# Patient Record
Sex: Female | Born: 1945
Health system: Southern US, Community
[De-identification: ages and names within clinical notes are randomized; demographics above are authoritative.]

## PROBLEM LIST (undated history)

## (undated) DIAGNOSIS — H547 Unspecified visual loss: Secondary | ICD-10-CM

## (undated) DIAGNOSIS — R42 Dizziness and giddiness: Secondary | ICD-10-CM

## (undated) DIAGNOSIS — I10 Essential (primary) hypertension: Secondary | ICD-10-CM

## (undated) DIAGNOSIS — H6690 Otitis media, unspecified, unspecified ear: Secondary | ICD-10-CM

## (undated) DIAGNOSIS — Z9289 Personal history of other medical treatment: Secondary | ICD-10-CM

## (undated) DIAGNOSIS — M2550 Pain in unspecified joint: Secondary | ICD-10-CM

## (undated) DIAGNOSIS — Z86718 Personal history of other venous thrombosis and embolism: Secondary | ICD-10-CM

## (undated) DIAGNOSIS — G4733 Obstructive sleep apnea (adult) (pediatric): Secondary | ICD-10-CM

## (undated) DIAGNOSIS — I739 Peripheral vascular disease, unspecified: Secondary | ICD-10-CM

## (undated) DIAGNOSIS — R112 Nausea with vomiting, unspecified: Secondary | ICD-10-CM

## (undated) DIAGNOSIS — C50919 Malignant neoplasm of unspecified site of unspecified female breast: Secondary | ICD-10-CM

## (undated) DIAGNOSIS — R0600 Dyspnea, unspecified: Secondary | ICD-10-CM

## (undated) DIAGNOSIS — E785 Hyperlipidemia, unspecified: Secondary | ICD-10-CM

## (undated) DIAGNOSIS — J189 Pneumonia, unspecified organism: Secondary | ICD-10-CM

## (undated) DIAGNOSIS — R252 Cramp and spasm: Secondary | ICD-10-CM

## (undated) DIAGNOSIS — Z803 Family history of malignant neoplasm of breast: Secondary | ICD-10-CM

## (undated) DIAGNOSIS — G459 Transient cerebral ischemic attack, unspecified: Secondary | ICD-10-CM

## (undated) DIAGNOSIS — C801 Malignant (primary) neoplasm, unspecified: Secondary | ICD-10-CM

## (undated) DIAGNOSIS — K219 Gastro-esophageal reflux disease without esophagitis: Secondary | ICD-10-CM

## (undated) DIAGNOSIS — M2540 Effusion, unspecified joint: Secondary | ICD-10-CM

## (undated) DIAGNOSIS — M169 Osteoarthritis of hip, unspecified: Secondary | ICD-10-CM

## (undated) DIAGNOSIS — R209 Unspecified disturbances of skin sensation: Secondary | ICD-10-CM

## (undated) DIAGNOSIS — Z8679 Personal history of other diseases of the circulatory system: Secondary | ICD-10-CM

## (undated) DIAGNOSIS — Z9889 Other specified postprocedural states: Secondary | ICD-10-CM

## (undated) DIAGNOSIS — M79609 Pain in unspecified limb: Secondary | ICD-10-CM

## (undated) DIAGNOSIS — E663 Overweight: Secondary | ICD-10-CM

## (undated) DIAGNOSIS — H409 Unspecified glaucoma: Secondary | ICD-10-CM

## (undated) DIAGNOSIS — Z8542 Personal history of malignant neoplasm of other parts of uterus: Secondary | ICD-10-CM

## (undated) DIAGNOSIS — Z923 Personal history of irradiation: Secondary | ICD-10-CM

## (undated) DIAGNOSIS — E119 Type 2 diabetes mellitus without complications: Secondary | ICD-10-CM

## (undated) HISTORY — DX: Family history of malignant neoplasm of breast: Z80.3

## (undated) HISTORY — DX: Pain in unspecified limb: M79.609

## (undated) HISTORY — DX: Personal history of other venous thrombosis and embolism: Z86.718

## (undated) HISTORY — DX: Unspecified glaucoma: H40.9

## (undated) HISTORY — PX: CHOLECYSTECTOMY: SHX55

## (undated) HISTORY — DX: Unspecified disturbances of skin sensation: R20.9

## (undated) HISTORY — DX: Type 2 diabetes mellitus without complications: E11.9

## (undated) HISTORY — DX: Osteoarthritis of hip, unspecified: M16.9

## (undated) HISTORY — PX: OTHER SURGICAL HISTORY: SHX169

## (undated) HISTORY — PX: KNEE ARTHROSCOPY: SHX127

## (undated) HISTORY — DX: Otitis media, unspecified, unspecified ear: H66.90

## (undated) HISTORY — DX: Obstructive sleep apnea (adult) (pediatric): G47.33

## (undated) HISTORY — DX: Gastro-esophageal reflux disease without esophagitis: K21.9

## (undated) HISTORY — PX: OOPHORECTOMY: SHX86

## (undated) HISTORY — DX: Overweight: E66.3

## (undated) HISTORY — DX: Dizziness and giddiness: R42

## (undated) HISTORY — DX: Personal history of other diseases of the circulatory system: Z86.79

## (undated) HISTORY — DX: Personal history of malignant neoplasm of other parts of uterus: Z85.42

## (undated) HISTORY — DX: Hyperlipidemia, unspecified: E78.5

## (undated) HISTORY — DX: Essential (primary) hypertension: I10

---

## 1998-02-11 ENCOUNTER — Other Ambulatory Visit: Admission: RE | Admit: 1998-02-11 | Discharge: 1998-02-11 | Payer: Self-pay | Admitting: Obstetrics & Gynecology

## 1998-06-13 DIAGNOSIS — Z8542 Personal history of malignant neoplasm of other parts of uterus: Secondary | ICD-10-CM

## 1998-06-13 HISTORY — DX: Personal history of malignant neoplasm of other parts of uterus: Z85.42

## 1998-06-13 HISTORY — PX: ABDOMINAL HYSTERECTOMY: SHX81

## 1999-04-06 ENCOUNTER — Other Ambulatory Visit: Admission: RE | Admit: 1999-04-06 | Discharge: 1999-04-06 | Payer: Self-pay | Admitting: Obstetrics & Gynecology

## 1999-08-03 ENCOUNTER — Ambulatory Visit: Admission: RE | Admit: 1999-08-03 | Discharge: 1999-08-03 | Payer: Self-pay | Admitting: Obstetrics & Gynecology

## 1999-08-05 ENCOUNTER — Other Ambulatory Visit: Admission: RE | Admit: 1999-08-05 | Discharge: 1999-08-05 | Payer: Self-pay | Admitting: Obstetrics & Gynecology

## 1999-08-05 ENCOUNTER — Encounter (INDEPENDENT_AMBULATORY_CARE_PROVIDER_SITE_OTHER): Payer: Self-pay

## 1999-08-11 ENCOUNTER — Ambulatory Visit: Admission: RE | Admit: 1999-08-11 | Discharge: 1999-08-11 | Payer: Self-pay | Admitting: Gynecology

## 1999-08-25 ENCOUNTER — Inpatient Hospital Stay (HOSPITAL_COMMUNITY): Admission: RE | Admit: 1999-08-25 | Discharge: 1999-08-28 | Payer: Self-pay | Admitting: Obstetrics & Gynecology

## 1999-09-02 ENCOUNTER — Emergency Department (HOSPITAL_COMMUNITY): Admission: EM | Admit: 1999-09-02 | Discharge: 1999-09-03 | Payer: Self-pay | Admitting: Emergency Medicine

## 2000-08-03 ENCOUNTER — Ambulatory Visit (HOSPITAL_BASED_OUTPATIENT_CLINIC_OR_DEPARTMENT_OTHER): Admission: RE | Admit: 2000-08-03 | Discharge: 2000-08-03 | Payer: Self-pay | Admitting: Internal Medicine

## 2001-02-11 LAB — HM MAMMOGRAPHY

## 2001-09-25 ENCOUNTER — Encounter: Admission: RE | Admit: 2001-09-25 | Discharge: 2001-12-24 | Payer: Self-pay | Admitting: Internal Medicine

## 2002-06-13 HISTORY — PX: OTHER SURGICAL HISTORY: SHX169

## 2002-08-19 ENCOUNTER — Ambulatory Visit (HOSPITAL_BASED_OUTPATIENT_CLINIC_OR_DEPARTMENT_OTHER): Admission: RE | Admit: 2002-08-19 | Discharge: 2002-08-19 | Payer: Self-pay | Admitting: Critical Care Medicine

## 2002-11-21 ENCOUNTER — Ambulatory Visit (HOSPITAL_BASED_OUTPATIENT_CLINIC_OR_DEPARTMENT_OTHER): Admission: RE | Admit: 2002-11-21 | Discharge: 2002-11-21 | Payer: Self-pay | Admitting: Critical Care Medicine

## 2003-12-01 ENCOUNTER — Encounter (HOSPITAL_BASED_OUTPATIENT_CLINIC_OR_DEPARTMENT_OTHER): Admission: RE | Admit: 2003-12-01 | Discharge: 2003-12-12 | Payer: Self-pay | Admitting: Internal Medicine

## 2004-03-09 ENCOUNTER — Other Ambulatory Visit: Admission: RE | Admit: 2004-03-09 | Discharge: 2004-03-09 | Payer: Self-pay | Admitting: Obstetrics & Gynecology

## 2005-06-18 ENCOUNTER — Emergency Department (HOSPITAL_COMMUNITY): Admission: EM | Admit: 2005-06-18 | Discharge: 2005-06-18 | Payer: Self-pay | Admitting: Emergency Medicine

## 2005-06-18 ENCOUNTER — Ambulatory Visit: Payer: Self-pay | Admitting: Internal Medicine

## 2005-06-20 ENCOUNTER — Encounter: Payer: Self-pay | Admitting: Cardiology

## 2005-06-20 ENCOUNTER — Ambulatory Visit: Payer: Self-pay

## 2005-06-21 ENCOUNTER — Ambulatory Visit: Payer: Self-pay | Admitting: Internal Medicine

## 2005-06-22 ENCOUNTER — Ambulatory Visit: Payer: Self-pay | Admitting: Internal Medicine

## 2005-06-27 ENCOUNTER — Ambulatory Visit (HOSPITAL_COMMUNITY): Admission: RE | Admit: 2005-06-27 | Discharge: 2005-06-27 | Payer: Self-pay | Admitting: Internal Medicine

## 2005-06-28 ENCOUNTER — Ambulatory Visit: Payer: Self-pay | Admitting: Internal Medicine

## 2006-03-20 ENCOUNTER — Ambulatory Visit: Payer: Self-pay | Admitting: Internal Medicine

## 2006-03-27 ENCOUNTER — Ambulatory Visit: Payer: Self-pay | Admitting: Internal Medicine

## 2006-10-05 ENCOUNTER — Ambulatory Visit: Payer: Self-pay | Admitting: Internal Medicine

## 2006-10-06 ENCOUNTER — Ambulatory Visit: Payer: Self-pay | Admitting: Internal Medicine

## 2006-10-06 LAB — CONVERTED CEMR LAB
BUN: 11 mg/dL (ref 6–23)
CO2: 29 meq/L (ref 19–32)
Calcium: 9 mg/dL (ref 8.4–10.5)
Chloride: 106 meq/L (ref 96–112)
Cholesterol: 163 mg/dL (ref 0–200)
Creatinine, Ser: 0.8 mg/dL (ref 0.4–1.2)
GFR calc Af Amer: 94 mL/min
GFR calc non Af Amer: 78 mL/min
Glucose, Bld: 178 mg/dL — ABNORMAL HIGH (ref 70–99)
HDL: 43 mg/dL (ref >39.0)
Hgb A1c MFr Bld: 8.4 % — ABNORMAL HIGH (ref 4.6–6.0)
LDL Cholesterol: 101 mg/dL — ABNORMAL HIGH (ref 0–99)
Potassium: 4.2 meq/L (ref 3.5–5.1)
Sodium: 141 meq/L (ref 135–145)
Total CHOL/HDL Ratio: 3.8
Triglycerides: 93 mg/dL (ref 0–149)
VLDL: 19 mg/dL (ref 0–40)

## 2007-01-04 ENCOUNTER — Ambulatory Visit: Payer: Self-pay | Admitting: Internal Medicine

## 2007-01-04 DIAGNOSIS — G4733 Obstructive sleep apnea (adult) (pediatric): Secondary | ICD-10-CM | POA: Insufficient documentation

## 2007-01-04 DIAGNOSIS — Z8679 Personal history of other diseases of the circulatory system: Secondary | ICD-10-CM

## 2007-01-04 DIAGNOSIS — K219 Gastro-esophageal reflux disease without esophagitis: Secondary | ICD-10-CM

## 2007-01-04 DIAGNOSIS — E119 Type 2 diabetes mellitus without complications: Secondary | ICD-10-CM

## 2007-01-04 DIAGNOSIS — E785 Hyperlipidemia, unspecified: Secondary | ICD-10-CM | POA: Insufficient documentation

## 2007-01-04 DIAGNOSIS — J45909 Unspecified asthma, uncomplicated: Secondary | ICD-10-CM | POA: Insufficient documentation

## 2007-01-04 DIAGNOSIS — I10 Essential (primary) hypertension: Secondary | ICD-10-CM | POA: Insufficient documentation

## 2007-01-04 DIAGNOSIS — F418 Other specified anxiety disorders: Secondary | ICD-10-CM | POA: Insufficient documentation

## 2007-01-04 DIAGNOSIS — E663 Overweight: Secondary | ICD-10-CM

## 2007-01-04 DIAGNOSIS — E1169 Type 2 diabetes mellitus with other specified complication: Secondary | ICD-10-CM | POA: Insufficient documentation

## 2007-01-04 HISTORY — DX: Hyperlipidemia, unspecified: E78.5

## 2007-01-04 HISTORY — DX: Gastro-esophageal reflux disease without esophagitis: K21.9

## 2007-01-04 HISTORY — DX: Type 2 diabetes mellitus without complications: E11.9

## 2007-01-04 HISTORY — DX: Personal history of other diseases of the circulatory system: Z86.79

## 2007-01-04 HISTORY — DX: Essential (primary) hypertension: I10

## 2007-01-04 HISTORY — DX: Overweight: E66.3

## 2007-01-04 LAB — CONVERTED CEMR LAB
BUN: 14 mg/dL (ref 6–23)
CO2: 31 meq/L (ref 19–32)
Calcium: 9.3 mg/dL (ref 8.4–10.5)
Chloride: 106 meq/L (ref 96–112)
Cholesterol: 173 mg/dL (ref 0–200)
Creatinine, Ser: 0.7 mg/dL (ref 0.4–1.2)
GFR calc Af Amer: 109 mL/min
GFR calc non Af Amer: 90 mL/min
Glucose, Bld: 122 mg/dL — ABNORMAL HIGH (ref 70–99)
HDL: 40.2 mg/dL (ref >39.0)
Hgb A1c MFr Bld: 7 % — ABNORMAL HIGH (ref 4.6–6.0)
LDL Cholesterol: 107 mg/dL — ABNORMAL HIGH (ref 0–99)
Potassium: 3.9 meq/L (ref 3.5–5.1)
Sodium: 142 meq/L (ref 135–145)
Total CHOL/HDL Ratio: 4.3
Triglycerides: 127 mg/dL (ref 0–149)
VLDL: 25 mg/dL (ref 0–40)

## 2007-07-06 ENCOUNTER — Ambulatory Visit: Payer: Self-pay | Admitting: Internal Medicine

## 2007-07-06 DIAGNOSIS — Z86718 Personal history of other venous thrombosis and embolism: Secondary | ICD-10-CM | POA: Insufficient documentation

## 2007-07-06 DIAGNOSIS — M79609 Pain in unspecified limb: Secondary | ICD-10-CM | POA: Insufficient documentation

## 2007-07-06 HISTORY — DX: Pain in unspecified limb: M79.609

## 2007-12-10 ENCOUNTER — Encounter: Admission: RE | Admit: 2007-12-10 | Discharge: 2007-12-10 | Payer: Self-pay | Admitting: Obstetrics and Gynecology

## 2008-02-14 ENCOUNTER — Emergency Department (HOSPITAL_COMMUNITY): Admission: EM | Admit: 2008-02-14 | Discharge: 2008-02-14 | Payer: Self-pay | Admitting: Emergency Medicine

## 2008-02-29 ENCOUNTER — Ambulatory Visit: Payer: Self-pay | Admitting: Internal Medicine

## 2008-02-29 DIAGNOSIS — R42 Dizziness and giddiness: Secondary | ICD-10-CM

## 2008-02-29 DIAGNOSIS — H6690 Otitis media, unspecified, unspecified ear: Secondary | ICD-10-CM

## 2008-02-29 HISTORY — DX: Otitis media, unspecified, unspecified ear: H66.90

## 2008-02-29 HISTORY — DX: Dizziness and giddiness: R42

## 2008-04-14 ENCOUNTER — Ambulatory Visit: Payer: Self-pay | Admitting: Internal Medicine

## 2008-07-30 ENCOUNTER — Ambulatory Visit: Payer: Self-pay | Admitting: Endocrinology

## 2008-07-30 DIAGNOSIS — H409 Unspecified glaucoma: Secondary | ICD-10-CM

## 2008-07-30 DIAGNOSIS — R209 Unspecified disturbances of skin sensation: Secondary | ICD-10-CM | POA: Insufficient documentation

## 2008-07-30 HISTORY — DX: Unspecified disturbances of skin sensation: R20.9

## 2008-07-30 HISTORY — DX: Unspecified glaucoma: H40.9

## 2008-07-30 LAB — CONVERTED CEMR LAB
BUN: 13 mg/dL (ref 6–23)
Blood Glucose, Fingerstick: 134
CO2: 31 meq/L (ref 19–32)
Calcium: 9.3 mg/dL (ref 8.4–10.5)
Chloride: 104 meq/L (ref 96–112)
Creatinine, Ser: 0.8 mg/dL (ref 0.4–1.2)
Folate: 10.8 ng/mL
GFR calc Af Amer: 93 mL/min
GFR calc non Af Amer: 77 mL/min
Glucose, Bld: 129 mg/dL — ABNORMAL HIGH (ref 70–99)
Hgb A1c MFr Bld: 6.9 % — ABNORMAL HIGH (ref 4.6–6.0)
Potassium: 4.3 meq/L (ref 3.5–5.1)
Sodium: 142 meq/L (ref 135–145)
TSH: 1.25 microintl units/mL (ref 0.35–5.50)
Vitamin B-12: 383 pg/mL (ref 211–911)

## 2008-08-01 ENCOUNTER — Telehealth: Payer: Self-pay | Admitting: Endocrinology

## 2008-10-14 ENCOUNTER — Telehealth (INDEPENDENT_AMBULATORY_CARE_PROVIDER_SITE_OTHER): Payer: Self-pay | Admitting: *Deleted

## 2009-01-14 ENCOUNTER — Telehealth (INDEPENDENT_AMBULATORY_CARE_PROVIDER_SITE_OTHER): Payer: Self-pay | Admitting: *Deleted

## 2009-06-15 ENCOUNTER — Encounter: Admission: RE | Admit: 2009-06-15 | Discharge: 2009-06-15 | Payer: Self-pay | Admitting: *Deleted

## 2009-09-25 ENCOUNTER — Ambulatory Visit: Payer: Self-pay | Admitting: Internal Medicine

## 2009-09-25 DIAGNOSIS — M169 Osteoarthritis of hip, unspecified: Secondary | ICD-10-CM | POA: Insufficient documentation

## 2009-09-25 DIAGNOSIS — M1610 Unilateral primary osteoarthritis, unspecified hip: Secondary | ICD-10-CM | POA: Insufficient documentation

## 2009-09-25 HISTORY — DX: Unilateral primary osteoarthritis, unspecified hip: M16.10

## 2009-09-25 HISTORY — DX: Osteoarthritis of hip, unspecified: M16.9

## 2009-09-29 ENCOUNTER — Ambulatory Visit: Payer: Self-pay | Admitting: Family Medicine

## 2009-10-20 ENCOUNTER — Ambulatory Visit: Payer: Self-pay | Admitting: Family Medicine

## 2009-10-29 ENCOUNTER — Ambulatory Visit: Payer: Self-pay | Admitting: Internal Medicine

## 2009-10-29 LAB — CONVERTED CEMR LAB
ALT: 17 units/L (ref 0–35)
AST: 14 units/L (ref 0–37)
Albumin: 3.9 g/dL (ref 3.5–5.2)
Alkaline Phosphatase: 46 units/L (ref 39–117)
BUN: 15 mg/dL (ref 6–23)
Basophils Absolute: 0 10*3/uL (ref 0.0–0.1)
Basophils Relative: 0.1 % (ref 0.0–3.0)
Bilirubin Urine: NEGATIVE
Bilirubin, Direct: 0.1 mg/dL (ref 0.0–0.3)
CO2: 30 meq/L (ref 19–32)
Calcium: 8.8 mg/dL (ref 8.4–10.5)
Chloride: 103 meq/L (ref 96–112)
Cholesterol: 145 mg/dL (ref 0–200)
Creatinine, Ser: 0.9 mg/dL (ref 0.4–1.2)
Eosinophils Absolute: 0.1 10*3/uL (ref 0.0–0.7)
Eosinophils Relative: 2.1 % (ref 0.0–5.0)
GFR calc non Af Amer: 71.58 mL/min (ref >60)
Glucose, Bld: 120 mg/dL — ABNORMAL HIGH (ref 70–99)
HCT: 34.5 % — ABNORMAL LOW (ref 36.0–46.0)
HDL: 45.3 mg/dL (ref >39.00)
Hemoglobin: 12 g/dL (ref 12.0–15.0)
Hgb A1c MFr Bld: 6.7 % — ABNORMAL HIGH (ref 4.6–6.5)
Ketones, ur: NEGATIVE mg/dL
LDL Cholesterol: 79 mg/dL (ref 0–99)
Leukocytes, UA: NEGATIVE
Lymphocytes Relative: 29.9 % (ref 12.0–46.0)
Lymphs Abs: 1.8 10*3/uL (ref 0.7–4.0)
MCHC: 34.9 g/dL (ref 30.0–36.0)
MCV: 81.3 fL (ref 78.0–100.0)
Monocytes Absolute: 0.4 10*3/uL (ref 0.1–1.0)
Monocytes Relative: 7.3 % (ref 3.0–12.0)
Neutro Abs: 3.6 10*3/uL (ref 1.4–7.7)
Neutrophils Relative %: 60.6 % (ref 43.0–77.0)
Nitrite: NEGATIVE
Platelets: 206 10*3/uL (ref 150.0–400.0)
Potassium: 4.5 meq/L (ref 3.5–5.1)
RBC: 4.24 M/uL (ref 3.87–5.11)
RDW: 14.8 % — ABNORMAL HIGH (ref 11.5–14.6)
Sodium: 139 meq/L (ref 135–145)
Specific Gravity, Urine: 1.02 (ref 1.000–1.030)
TSH: 1.78 microintl units/mL (ref 0.35–5.50)
Total Bilirubin: 0.6 mg/dL (ref 0.3–1.2)
Total CHOL/HDL Ratio: 3
Total Protein, Urine: NEGATIVE mg/dL
Total Protein: 6.6 g/dL (ref 6.0–8.3)
Triglycerides: 103 mg/dL (ref 0.0–149.0)
Urine Glucose: NEGATIVE mg/dL
Urobilinogen, UA: 0.2 (ref 0.0–1.0)
VLDL: 20.6 mg/dL (ref 0.0–40.0)
WBC: 6 10*3/uL (ref 4.5–10.5)
pH: 6 (ref 5.0–8.0)

## 2009-11-05 ENCOUNTER — Ambulatory Visit: Payer: Self-pay | Admitting: Internal Medicine

## 2010-02-25 ENCOUNTER — Ambulatory Visit (HOSPITAL_COMMUNITY): Admission: RE | Admit: 2010-02-25 | Discharge: 2010-02-25 | Payer: Self-pay | Admitting: Family Medicine

## 2010-03-09 ENCOUNTER — Telehealth: Payer: Self-pay | Admitting: Internal Medicine

## 2010-03-29 ENCOUNTER — Encounter: Payer: Self-pay | Admitting: Internal Medicine

## 2010-07-15 NOTE — Assessment & Plan Note (Signed)
Summary: DIZZY/$50/CD   Vital Signs:  Patient Profile:   65 Years Old Female Weight:      239 pounds Temp:     98.8 degrees F oral Pulse rate:   103 / minute BP sitting:   130 / 76  (left arm) Cuff size:   large  Vitals Entered By: Payton Spark CMA (February 29, 2008 4:24 PM)                 Chief Complaint:  dizzy.  History of Present Illness: here wtih bilat earache, fever, malaise, myalgias for 5 days, without sp, wheezing, sob or doe; also with marked vertigo positional - seen recently at ER, ears not examined and told she had BPV    Updated Prior Medication List: COREG 12.5 MG TABS (CARVEDILOL) 1 tablet by mouth twice a day PRAVASTATIN SODIUM 40 MG TABS (PRAVASTATIN SODIUM) Take 2 tablet by mouth once a day ACTOPLUS MET 15-500 MG  TABS (PIOGLITAZONE HCL-METFORMIN HCL) 1 by mouth qd BENICAR HCT 40-25 MG  TABS (OLMESARTAN MEDOXOMIL-HCTZ) 1 by mouth qd ALPRAZOLAM 0.25 MG  TABS (ALPRAZOLAM) 1 by mouth once daily prn CEPHALEXIN 500 MG TABS (CEPHALEXIN) 1 by mouth tid MECLIZINE HCL 12.5 MG TABS (MECLIZINE HCL) 1 -2 by mouth q 6 hrs as needed dizzy  Current Allergies (reviewed today): No known allergies   Past Medical History:    Reviewed history from 07/06/2007 and no changes required:       Diabetes mellitus, type II       GERD       Hyperlipidemia       Hypertension       Asthma, mild       Obesity       Obstructive Sleep Apnea       Anxiety       Transient ischemic attack, hx of(06/18/2005)       chronic cough       DVT, hx of  Past Surgical History:    Reviewed history from 01/04/2007 and no changes required:       Cholecystectomy       Hysterectomy(1999)       Oophorectomy(1999)   Social History:    Reviewed history from 07/06/2007 and no changes required:       Never Smoked       Alcohol use-no       Married    Review of Systems       all otherwise negative    Physical Exam  General:     alert and overweight-appearing.  , mild ill   Head:     Normocephalic and atraumatic without obvious abnormalities. No apparent alopecia or balding. Eyes:     No corneal or conjunctival inflammation noted. EOMI. Perrla.  Ears:     bilat tm's red, mild bulging with post fluid Nose:     nasal dischargemucosal pallor and mucosal erythema.   Mouth:     pharyngeal erythema and fair dentition.   Neck:     supple and cervical lymphadenopathy.   Lungs:     Normal respiratory effort, chest expands symmetrically. Lungs are clear to auscultation, no crackles or wheezes. Heart:     Normal rate and regular rhythm. S1 and S2 normal without gallop, murmur, click, rub or other extra sounds. Extremities:     no edema, no ulcers     Impression & Recommendations:  Problem # 1:  OTITIS MEDIA, ACUTE, BILATERAL (ICD-382.9)  Her updated medication list for this  problem includes:    Cephalexin 500 Mg Tabs (Cephalexin) .Marland Kitchen... 1 by mouth tid treat as above, f/u any worsening signs or symptoms   Problem # 2:  INTERMITTENT VERTIGO (ICD-780.4) likely related to above - tx with meclizine Her updated medication list for this problem includes:    Meclizine Hcl 12.5 Mg Tabs (Meclizine hcl) .Marland Kitchen... 1 -2 by mouth q 6 hrs as needed dizzy   Problem # 3:  HYPERTENSION (ICD-401.9)  Her updated medication list for this problem includes:    Coreg 12.5 Mg Tabs (Carvedilol) .Marland Kitchen... 1 tablet by mouth twice a day    Benicar Hct 40-25 Mg Tabs (Olmesartan medoxomil-hctz) .Marland Kitchen... 1 by mouth qd  BP today: 130/76 Prior BP: 131/84 (07/06/2007)  Labs Reviewed: Creat: 0.7 (01/04/2007) Chol: 173 (01/04/2007)   HDL: 40.2 (01/04/2007)   LDL: 107 (01/04/2007)   TG: 127 (01/04/2007) stable overall by hx and exam, ok to continue meds/tx as is   Complete Medication List: 1)  Coreg 12.5 Mg Tabs (Carvedilol) .Marland Kitchen.. 1 tablet by mouth twice a day 2)  Pravastatin Sodium 40 Mg Tabs (Pravastatin sodium) .... Take 2 tablet by mouth once a day 3)  Actoplus Met 15-500 Mg Tabs  (Pioglitazone hcl-metformin hcl) .Marland Kitchen.. 1 by mouth qd 4)  Benicar Hct 40-25 Mg Tabs (Olmesartan medoxomil-hctz) .Marland Kitchen.. 1 by mouth qd 5)  Alprazolam 0.25 Mg Tabs (Alprazolam) .Marland Kitchen.. 1 by mouth once daily prn 6)  Cephalexin 500 Mg Tabs (Cephalexin) .Marland Kitchen.. 1 by mouth tid 7)  Meclizine Hcl 12.5 Mg Tabs (Meclizine hcl) .Marland Kitchen.. 1 -2 by mouth q 6 hrs as needed dizzy   Patient Instructions: 1)  Please take all new medications as prescribed 2)  Continue all medications that you may have been taking previously 3)  you can also use Mucinex D twice per day for the ear congestion   Prescriptions: MECLIZINE HCL 12.5 MG TABS (MECLIZINE HCL) 1 -2 by mouth q 6 hrs as needed dizzy  #60 x 1   Entered and Authorized by:   Corwin Levins MD   Signed by:   Corwin Levins MD on 02/29/2008   Method used:   Print then Give to Patient   RxID:   [id number] CEPHALEXIN 500 MG TABS (CEPHALEXIN) 1 by mouth tid  #30 x 0   Entered and Authorized by:   Corwin Levins MD   Signed by:   Corwin Levins MD on 02/29/2008   Method used:   Print then Give to Patient   RxID:   [id number]  ]

## 2010-07-15 NOTE — Progress Notes (Signed)
  Phone Note Call from Patient Call back at Home Phone [phone]   Caller: [phone]/ Call For: Corwin Levins MD Summary of Call: per Renella Cunas call would like a referall to see MD  Talmage Coin, for second opion  Initial call taken by: Shelbie Proctor,  January 14, 2009 10:43 AM  Follow-up for Phone Call        does she mean a different endocrinologist? Follow-up by: Corwin Levins MD,  January 14, 2009 5:22 PM  Additional Follow-up for Phone Call Additional follow up Details #1::        yes Additional Follow-up by: Shelbie Proctor,  January 15, 2009 8:19 AM    Additional Follow-up for Phone Call Additional follow up Details #2::    ok for this - I will do Follow-up by: Corwin Levins MD,  January 15, 2009 11:52 AM  Additional Follow-up for Phone Call Additional follow up Details #3:: Details for Additional Follow-up Action Taken: I called pt to inform  Additional Follow-up by: Shelbie Proctor,  January 15, 2009 11:54 AM

## 2010-07-15 NOTE — Assessment & Plan Note (Signed)
Summary: BP IS HIGH/NWS   Vital Signs:  Patient profile:   65 year old female Height:      64 inches Weight:      235 pounds BMI:     40.48 O2 Sat:      97 % on Room air Temp:     98.3 degrees F oral Pulse rate:   60 / minute BP sitting:   138 / 70  (left arm) Cuff size:   large  Vitals Entered ByZella Ball Ewing (September 25, 2009 11:54 AM)  O2 Flow:  Room air CC: BP elevated, nausea, dizzy/RE   Primary Care Provider:  Corwin Levins MD  CC:  BP elevated, nausea, and dizzy/RE.  History of Present Illness: mother just died with cancer, sister with mult chronic helath issues, almost died recetnly and now going to [place] live with her daughters;  she is left with the estate ;  also more pain recently with bilat hip arthritis,  tx with indocin per dr Acquanetta Sit - to f/u in 3 mo - may need hip replacement; Pt denies CP, sob, doe, wheezing, orthopnea, pnd, worsening LE edema, palps, dizziness or syncope  Pt denies new neuro symptoms such as headache, facial or extremity weakness   But BP has been elev avering it seems int he 152 to 172 range, with one at 198/111; some mild nausea noted on occasion but no vomiting, wt loss, dysphagia, abd pain, bowel habit change or blood.    Preventive Screening-Counseling & Management      Drug Use:  no.    Problems Prior to Update: 1)  Glaucoma  (ICD-365.9) 2)  Osteoarthritis, Hip  (ICD-715.95) 3)  Numbness  (ICD-782.0) 4)  Glaucoma  (ICD-365.9) 5)  Intermittent Vertigo  (ICD-780.4) 6)  Otitis Media, Acute, Bilateral  (ICD-382.9) 7)  Dvt, Hx of  (ICD-V12.51) 8)  Leg Pain, Left  (ICD-729.5) 9)  Transient Ischemic Attack, Hx of  (ICD-V12.50) 10)  Anxiety  (ICD-300.00) 11)  Sleep Apnea, Obstructive  (ICD-327.23) 12)  Overweight  (ICD-278.02) 13)  Asthma  (ICD-493.90) 14)  Hypertension  (ICD-401.9) 15)  Hyperlipidemia  (ICD-272.4) 16)  Gerd  (ICD-530.81) 17)  Diabetes Mellitus, Type II  (ICD-250.00)  Medications Prior to Update: 1)  Coreg 12.5 Mg Tabs  (Carvedilol) .Marland Kitchen.. 1 Tablet By Mouth Twice A Day 2)  Pravastatin Sodium 40 Mg Tabs (Pravastatin Sodium) .... Take 2 Tablet By Mouth Once A Day 3)  Actoplus Met 15-500 Mg  Tabs (Pioglitazone Hcl-Metformin Hcl) .Marland Kitchen.. 1 By Mouth Qd 4)  Benicar Hct 40-25 Mg  Tabs (Olmesartan Medoxomil-Hctz) .Marland Kitchen.. 1 By Mouth Qd 5)  Alprazolam 0.25 Mg  Tabs (Alprazolam) .Marland Kitchen.. 1 By Mouth Two Times A Day As Needed 6)  Meclizine Hcl 12.5 Mg Tabs (Meclizine Hcl) .Marland Kitchen.. 1 -2 By Mouth Q 6 Hrs As Needed Dizzy 7)  Januvia 100 Mg Tabs (Sitagliptin Phosphate) .... Qam  Current Medications (verified): 1)  Coreg 6.25 Mg Tabs (Carvedilol) .Marland Kitchen.. 1po Two Times A Day 2)  Pravastatin Sodium 40 Mg Tabs (Pravastatin Sodium) .... Take 2 Tablet By Mouth Once A Day 3)  Actoplus Met 15-500 Mg  Tabs (Pioglitazone Hcl-Metformin Hcl) .Marland Kitchen.. 1 By Mouth Qd 4)  Benicar Hct 40-25 Mg  Tabs (Olmesartan Medoxomil-Hctz) .Marland Kitchen.. 1 By Mouth Once Daily 5)  Alprazolam 0.25 Mg  Tabs (Alprazolam) .Marland Kitchen.. 1 By Mouth Two Times A Day As Needed 6)  Indomethacin 25 Mg Caps (Indomethacin) .... Use Ad As Needed Two Times A Day 7)  Lumigan  0.01 % Soln (Bimatoprost) .Marland Kitchen.. 1 Gtt Both Eyes At Night  Allergies (verified): 1)  ! 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 mother with dementia, breast cancer and lung cancer sister with stroke son with drug abuse no dm  Social History: Reviewed  history from 07/06/2007 and no changes required. 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   Head:  normocephalic and atraumatic.   Eyes:  vision grossly intact, pupils equal, and pupils round.   Ears:  R ear normal and L ear normal.   Nose:  no external deformity and no nasal discharge.   Mouth:  no gingival abnormalities and pharynx pink and moist.   Neck:  supple and no masses.   Lungs:  normal respiratory effort and normal breath sounds.   Heart:  normal rate and regular rhythm.   Extremities:  no edema, no erythema    Impression & Recommendations:  Problem # 1:  HYPERTENSION (ICD-401.9)  Her updated medication list for this problem includes:    Coreg 6.25 Mg Tabs (Carvedilol) .Marland Kitchen... 1po two times a day    Benicar Hct 40-25 Mg Tabs (Olmesartan medoxomil-hctz) .Marland Kitchen... 1 by mouth once daily  Orders: EKG w/ Interpretation (93000) uncontrolled  - to add the coreg above, f/u in 1 mo   Complete Medication List: 1)  Coreg 6.25 Mg Tabs (Carvedilol) .Marland Kitchen.. 1po two times a day 2)  Pravastatin Sodium 40 Mg Tabs (Pravastatin sodium) .... Take 2 tablet by mouth once a day 3)  Actoplus Met 15-500 Mg Tabs (Pioglitazone hcl-metformin hcl) .Marland Kitchen.. 1 by mouth qd 4)  Benicar Hct 40-25 Mg Tabs (Olmesartan medoxomil-hctz) .Marland Kitchen.. 1 by mouth once daily 5)  Alprazolam 0.25 Mg Tabs (Alprazolam) .Marland Kitchen.. 1 by mouth two times a day as needed 6)  Indomethacin 25 Mg Caps (Indomethacin) .... Use ad as needed two times a day 7)  Lumigan 0.01 % Soln (Bimatoprost) .Marland Kitchen.. 1 gtt both eyes at night  Patient Instructions: 1)  Please take all new medications as prescribed 2)  Continue all previous medications as before this visit  3)  Please schedule a follow-up appointment in 1 month with CPX labs Prescriptions: BENICAR HCT 40-25 MG  TABS (OLMESARTAN  MEDOXOMIL-HCTZ) 1 by mouth once daily  #30 x 11   Entered and Authorized by:   Corwin Levins MD   Signed by:   Corwin Levins MD on 09/25/2009   Method used:   Electronically to        [facility] (retail)       [address]       [place], [place]  [address]       Ph: [phone]       Fax: [phone]   RxID:   [id number] PRAVASTATIN SODIUM 40 MG TABS (PRAVASTATIN SODIUM)  Take 2 tablet by mouth once a day  #180 x 3   Entered and Authorized by:   Corwin Levins MD   Signed by:   Corwin Levins MD on 09/25/2009   Method used:   Electronically to        [facility] (retail)       [address]       [place], [place]  [address]       Ph: [phone]       Fax: [phone]   RxID:   [id number] COREG 6.25 MG TABS (CARVEDILOL) 1po two times a day  #60 x 11   Entered and Authorized by:   Corwin Levins MD   Signed by:   Corwin Levins MD on 09/25/2009   Method used:   Electronically to        [facility] (retail)       [address]       [place], [place]  [address]       Ph: [phone]       Fax: [phone]   RxID:   [id number] COREG 6.25 MG TABS (CARVEDILOL) 1po two times a day  #60 x 11   Entered and Authorized by:   Corwin Levins MD   Signed by:   Corwin Levins MD on 09/25/2009   Method used:   Print then Give to Patient   RxID:   [id number]

## 2010-07-15 NOTE — Progress Notes (Signed)
Summary: rx  Phone Note Call from Patient Call back at Home Phone [phone]   Caller: Patient/[phone] Call For: Corwin Levins MD Summary of Call: per Renella Cunas call her boss passed away requseting a rx for Xanax Initial call taken by: Shelbie Proctor,  Oct 14, 2008 8:15 AM  Follow-up for Phone Call        done hardcopy to [facility] side B - debra   Additional Follow-up for Phone Call Additional follow up Details #1::        called pt to inform rx will be left at front office for pick up Additional Follow-up by: Shelbie Proctor,  Oct 14, 2008 1:21 PM    New/Updated Medications: ALPRAZOLAM 0.25 MG  TABS (ALPRAZOLAM) 1 by mouth two times a day as needed   Prescriptions: ALPRAZOLAM 0.25 MG  TABS (ALPRAZOLAM) 1 by mouth two times a day as needed  #60 x 1   Entered and Authorized by:   Corwin Levins MD   Signed by:   Corwin Levins MD on 10/14/2008   Method used:   Print then Give to Patient   RxID:   [id number]

## 2010-07-15 NOTE — Assessment & Plan Note (Signed)
Summary: WGT MGT CLASS WITH DR SYKES/[facility]   Allergies: 1)  ! * Januvia   Complete Medication List: 1)  Coreg 6.25 Mg Tabs (Carvedilol) .Marland Kitchen.. 1po two times a day 2)  Pravastatin Sodium 40 Mg Tabs (Pravastatin sodium) .... Take 2 tablet by mouth once a day 3)  Actoplus Met 15-500 Mg Tabs (Pioglitazone hcl-metformin hcl) .Marland Kitchen.. 1 by mouth qd 4)  Benicar Hct 40-25 Mg Tabs (Olmesartan medoxomil-hctz) .Marland Kitchen.. 1 by mouth once daily 5)  Alprazolam 0.25 Mg Tabs (Alprazolam) .Marland Kitchen.. 1 by mouth two times a day as needed 6)  Indomethacin 25 Mg Caps (Indomethacin) .... Use ad as needed two times a day 7)  Lumigan 0.01 % Soln (Bimatoprost) .Marland Kitchen.. 1 gtt both eyes at night

## 2010-07-15 NOTE — Assessment & Plan Note (Signed)
Summary: flu shot/[name]/cd  Nurse Visit   Vital Signs:  Patient Profile:   65 Years Old Female Temp:     98.7 degrees F oral  Vitals Entered By: Windell Norfolk (April 14, 2008 4:21 PM)                 Prior Medications: COREG 12.5 MG TABS (CARVEDILOL) 1 tablet by mouth twice a day PRAVASTATIN SODIUM 40 MG TABS (PRAVASTATIN SODIUM) Take 2 tablet by mouth once a day ACTOPLUS MET 15-500 MG  TABS (PIOGLITAZONE HCL-METFORMIN HCL) 1 by mouth qd BENICAR HCT 40-25 MG  TABS (OLMESARTAN MEDOXOMIL-HCTZ) 1 by mouth qd ALPRAZOLAM 0.25 MG  TABS (ALPRAZOLAM) 1 by mouth once daily prn CEPHALEXIN 500 MG TABS (CEPHALEXIN) 1 by mouth tid MECLIZINE HCL 12.5 MG TABS (MECLIZINE HCL) 1 -2 by mouth q 6 hrs as needed dizzy Current Allergies: No known allergies     Orders Added: 1)  Flu Vaccine 43yrs + [81191] 2)  Administration Flu vaccine [G0008]    Flu Vaccine Consent Questions     Do you have a history of severe allergic reactions to this vaccine? no    Any prior history of allergic reactions to egg and/or gelatin? no    Do you have a sensitivity to the preservative Thimersol? no    Do you have a past history of Guillan-Barre Syndrome? no    Do you currently have an acute febrile illness? no    Have you ever had a severe reaction to latex? no    Vaccine information given and explained to patient? yes    Are you currently pregnant? no    Lot Number:[id number]   Exp Date:12/10/2008   Site Given  Left Deltoid IM] .medflu

## 2010-07-15 NOTE — Letter (Signed)
Summary: [facility]  [facility]   Imported By: Lester Dansville 04/22/2010 10:36:49  _____________________________________________________________________  External Attachment:    Type:   Image     Comment:   External Document

## 2010-07-15 NOTE — Progress Notes (Signed)
  Phone Note Refill Request Message from:  Fax from Pharmacy on March 09, 2010 3:17 PM  Refills Requested: Medication #1:  ACTOPLUS MET 15-500 MG  TABS 1 by mouth qd   Dosage confirmed as above?Dosage Confirmed   Last Refilled: 07/20/2009   Notes: [facility] Initial call taken by: Robin Ewing CMA (AAMA),  March 09, 2010 3:18 PM    Prescriptions: ACTOPLUS MET 15-500 MG  TABS (PIOGLITAZONE HCL-METFORMIN HCL) 1 by mouth qd  #30 Tablet x 10   Entered by:   Zella Ball Ewing CMA (AAMA)   Authorized by:   Corwin Levins MD   Signed by:   Scharlene Gloss CMA (AAMA) on 03/09/2010   Method used:   Faxed to ...       [facility]  [phone]* (retail)       [address] [place], [place]  [address]       Ph: [phone] or [phone]       Fax: [phone]   RxID:   [id number]

## 2010-07-15 NOTE — Progress Notes (Signed)
Summary: Lab result and prescription  Phone Note Call from Patient   Caller: Patient Reason for Call: Lab or Test Results Summary of Call: Patient says result of A1C is not on [facility], and she wants report.  Also, patient wants to know why prescription for Alma Friendly has not yet been sent to pharmacy. Initial call taken by: Leonette Monarch Site Manager,  August 01, 2008 2:27 PM  Follow-up for Phone Call        i called pt 08/01/08 add januvia 100 mg qam ret 3 mos Follow-up by: Minus Breeding MD,  August 01, 2008 6:33 PM    New/Updated Medications: JANUVIA 100 MG TABS (SITAGLIPTIN PHOSPHATE) qam   Prescriptions: JANUVIA 100 MG TABS (SITAGLIPTIN PHOSPHATE) qam  #30 x 11   Entered and Authorized by:   Minus Breeding MD   Signed by:   Minus Breeding MD on 08/01/2008   Method used:   Electronically to        [facility]  [phone]* (retail)       [address] [place], [place]  [address]       Ph: [phone] or [phone]       Fax: [phone]   RxID:   [id number]

## 2010-07-15 NOTE — Assessment & Plan Note (Signed)
Summary: wgt mgt class w/Dr. Youlanda Mighty   Allergies: 1)  ! * Januvia   Complete Medication List: 1)  Coreg 6.25 Mg Tabs (Carvedilol) .Marland Kitchen.. 1po two times a day 2)  Pravastatin Sodium 40 Mg Tabs (Pravastatin sodium) .... Take 2 tablet by mouth once a day 3)  Actoplus Met 15-500 Mg Tabs (Pioglitazone hcl-metformin hcl) .Marland Kitchen.. 1 by mouth qd 4)  Benicar Hct 40-25 Mg Tabs (Olmesartan medoxomil-hctz) .Marland Kitchen.. 1 by mouth once daily 5)  Alprazolam 0.25 Mg Tabs (Alprazolam) .Marland Kitchen.. 1 by mouth two times a day as needed 6)  Indomethacin 25 Mg Caps (Indomethacin) .... Use ad as needed two times a day 7)  Lumigan 0.01 % Soln (Bimatoprost) .Marland Kitchen.. 1 gtt both eyes at night

## 2010-07-15 NOTE — Assessment & Plan Note (Signed)
Summary: FU-STC   Vital Signs:  Patient Profile:   65 Years Old Female Weight:      235 pounds Temp:     98.4 degrees F oral Pulse rate:   103 / minute BP sitting:   131 / 84  (right arm) Cuff size:   large  Pt. in pain?   yes    Location:   leg    Intensity:   10    Type:       dull  Vitals Entered By: Maris Berger (July 06, 2007 8:31 AM)                  Preventive Care Screening  Bone Density:    Date:  06/03/2003    Next Due:  06/2005    Results:  normal std dev   Chief Complaint:  left leg pain.  History of Present Illness: just starting to excericise and cutting back on calories; c/o LLE pain - was in the left lower back for a few  months, but now seems to be more in the left knee and lat mid thigh area, worse apparetnly after sitting as she does for hours at a time for work; not worse to sleep on left side at night; no falls or swelling she can tell  Current Allergies (reviewed today): No known allergies  Updated/Current Medications (including changes made in today's visit):  COREG 12.5 MG TABS (CARVEDILOL) 1 tablet by mouth twice a day PRAVASTATIN SODIUM 40 MG TABS (PRAVASTATIN SODIUM) Take 2 tablet by mouth once a day ACTOPLUS MET 15-500 MG  TABS (PIOGLITAZONE HCL-METFORMIN HCL) 1 by mouth qd BENICAR HCT 40-25 MG  TABS (OLMESARTAN MEDOXOMIL-HCTZ) 1 by mouth qd ALPRAZOLAM 0.25 MG  TABS (ALPRAZOLAM) 1 by mouth once daily prn   Past Medical History:    Reviewed history from 01/04/2007 and no changes required:       Diabetes mellitus, type II       GERD       Hyperlipidemia       Hypertension       Asthma, mild       Obesity       Obstructive Sleep Apnea       Anxiety       Transient ischemic attack, hx of(06/18/2005)       chronic cough       DVT, hx of  Past Surgical History:    Reviewed history from 01/04/2007 and no changes required:       Cholecystectomy       Hysterectomy(1999)       Oophorectomy(1999)   Family History:  Reviewed history and no changes required:       mother with dementia       sister with stroke       son with drug abuse  Social History:    Reviewed history and no changes required:       Never Smoked       Alcohol use-no       Married   Risk Factors:  Tobacco use:  never Alcohol use:  no   Review of Systems       declines colonoscopy again even though she is due   Physical Exam  General:     Well-developed,well-nourished,in no acute distress; alert,appropriate and cooperative throughout examination Head:     Normocephalic and atraumatic without obvious abnormalities. No apparent alopecia or balding. Eyes:     No corneal or conjunctival inflammation noted.  EOMI. Perrla. Ears:     External ear exam shows no significant lesions or deformities.  Otoscopic examination reveals clear canals, tympanic membranes are intact bilaterally without bulging, retraction, inflammation or discharge. Hearing is grossly normal bilaterally. Nose:     External nasal examination shows no deformity or inflammation. Nasal mucosa are pink and moist without lesions or exudates. Mouth:     Oral mucosa and oropharynx without lesions or exudates.  Teeth in good repair. Neck:     No deformities, masses, or tenderness noted. Lungs:     Normal respiratory effort, chest expands symmetrically. Lungs are clear to auscultation, no crackles or wheezes. Heart:     Normal rate and regular rhythm. S1 and S2 normal without gallop, murmur, click, rub or other extra sounds. Extremities:     No clubbing, cyanosis, edema, or deformity noted with normal full range of motion of all joints.      Impression & Recommendations:  Problem # 1:  LEG PAIN, LEFT (ICD-729.5) I suspect primary problem is the knee given the exam; she c/o excrutiating pain but declines further tx more than alleve as needed; will refer to ortho Orders: Orthopedic Surgeon Referral (Ortho Surgeon)   Problem # 2:  HYPERTENSION (ICD-401.9)   Her updated medication list for this problem includes:    Coreg 12.5 Mg Tabs (Carvedilol) .Marland Kitchen... 1 tablet by mouth twice a day    Benicar Hct 40-25 Mg Tabs (Olmesartan medoxomil-hctz) .Marland Kitchen... 1 by mouth qd stable, cont meds as is  Problem # 3:  DIABETES MELLITUS, TYPE II (ICD-250.00)  Her updated medication list for this problem includes:    Actoplus Met 15-500 Mg Tabs (Pioglitazone hcl-metformin hcl) .Marland Kitchen... 1 by mouth qd    Benicar Hct 40-25 Mg Tabs (Olmesartan medoxomil-hctz) .Marland Kitchen... 1 by mouth qd  Labs Reviewed: HgBA1c: 7.0 (01/04/2007)   Creat: 0.7 (01/04/2007)     stable by hx, cont meds, declines labs today  Complete Medication List: 1)  Coreg 12.5 Mg Tabs (Carvedilol) .Marland Kitchen.. 1 tablet by mouth twice a day 2)  Pravastatin Sodium 40 Mg Tabs (Pravastatin sodium) .... Take 2 tablet by mouth once a day 3)  Actoplus Met 15-500 Mg Tabs (Pioglitazone hcl-metformin hcl) .Marland Kitchen.. 1 by mouth qd 4)  Benicar Hct 40-25 Mg Tabs (Olmesartan medoxomil-hctz) .Marland Kitchen.. 1 by mouth qd 5)  Alprazolam 0.25 Mg Tabs (Alprazolam) .Marland Kitchen.. 1 by mouth once daily prn   Patient Instructions: 1)  continue all medications as you have 2)  you will be contacted about the referral to orthopedic 3)  Please schedule a follow-up appointment in 6 months with CPX labs    ]

## 2010-07-15 NOTE — Assessment & Plan Note (Signed)
Summary: physical--1 mth--stc   Vital Signs:  Patient profile:   65 year old female Height:      64 inches Weight:      231.50 pounds BMI:     39.88 O2 Sat:      97 % on Room air Temp:     97 degrees F oral Pulse rate:   58 / minute BP sitting:   118 / 60  (left arm) Cuff size:   large  Vitals Entered ByZella Ball Ewing (Nov 05, 2009 8:42 AM)  O2 Flow:  Room air  Preventive Care Screening     declines tetanus and pneumonia shots, and colonoscopy and bone density - all due to high deductible insurance   CC: Adult Physical/RE   Primary Care Provider:  Corwin Levins MD  CC:  Adult Physical/RE.  History of Present Illness: lost 7 lbs with better diet after diet class, and more excercise;  since last seen has  been lightheded twice recnetly  - had to leave church due to nausea as well, and left hand numbness (which is different from other numbness that she has in the AM when waking up that resolved in less than 30 minutes, no pain or wekaness);  does take benicar regulaly, and BP similar to today per pt near the episode times;  dizzy starts before the nausea;  Pt denies CP, sob, doe, wheezing, orthopnea, pnd, worsening LE edema, palps, dizziness or syncope Pt denies new neuro symptoms such as headache, facial or extremity weakness , or vision blurred.  No fever, night sweats.    Problems Prior to Update: 1)  Dizziness  (ICD-780.4) 2)  Glaucoma  (ICD-365.9) 3)  Osteoarthritis, Hip  (ICD-715.95) 4)  Numbness  (ICD-782.0) 5)  Glaucoma  (ICD-365.9) 6)  Intermittent Vertigo  (ICD-780.4) 7)  Otitis Media, Acute, Bilateral  (ICD-382.9) 8)  Dvt, Hx of  (ICD-V12.51) 9)  Leg Pain, Left  (ICD-729.5) 10)  Transient Ischemic Attack, Hx of  (ICD-V12.50) 11)  Anxiety  (ICD-300.00) 12)  Sleep Apnea, Obstructive  (ICD-327.23) 13)  Overweight  (ICD-278.02) 14)  Asthma  (ICD-493.90) 15)  Hypertension  (ICD-401.9) 16)  Hyperlipidemia  (ICD-272.4) 17)  Gerd  (ICD-530.81) 18)  Diabetes Mellitus,  Type II  (ICD-250.00)  Medications Prior to Update: 1)  Coreg 6.25 Mg Tabs (Carvedilol) .Marland Kitchen.. 1po Two Times A Day 2)  Pravastatin Sodium 40 Mg Tabs (Pravastatin Sodium) .... Take 2 Tablet By Mouth Once A Day 3)  Actoplus Met 15-500 Mg  Tabs (Pioglitazone Hcl-Metformin Hcl) .Marland Kitchen.. 1 By Mouth Qd 4)  Benicar Hct 40-25 Mg  Tabs (Olmesartan Medoxomil-Hctz) .Marland Kitchen.. 1 By Mouth Once Daily 5)  Alprazolam 0.25 Mg  Tabs (Alprazolam) .Marland Kitchen.. 1 By Mouth Two Times A Day As Needed 6)  Indomethacin 25 Mg Caps (Indomethacin) .... Use Ad As Needed Two Times A Day 7)  Lumigan 0.01 % Soln (Bimatoprost) .Marland Kitchen.. 1 Gtt Both Eyes At Night  Current Medications (verified): 1)  Coreg 6.25 Mg Tabs (Carvedilol) .Marland Kitchen.. 1po Two Times A Day 2)  Pravastatin Sodium 40 Mg Tabs (Pravastatin Sodium) .... Take 2 Tablet By Mouth Once A Day 3)  Actoplus Met 15-500 Mg  Tabs (Pioglitazone Hcl-Metformin Hcl) .Marland Kitchen.. 1 By Mouth Qd 4)  Benicar Hct 40-25 Mg  Tabs (Olmesartan Medoxomil-Hctz) .Marland Kitchen.. 1 By Mouth Once Daily 5)  Alprazolam 0.25 Mg  Tabs (Alprazolam) .Marland Kitchen.. 1 By Mouth Two Times A Day As Needed 6)  Indomethacin 25 Mg Caps (Indomethacin) .... Use Ad As Needed Two  Times A Day 7)  Lumigan 0.01 % Soln (Bimatoprost) .Marland Kitchen.. 1 Gtt Both Eyes At Night  Allergies (verified): 1)  ! Alma Friendly  Past History:  Past Medical History: Last updated: 09/25/2009 Diabetes mellitus, type II GERD Hyperlipidemia Hypertension Asthma, mild Obesity Obstructive Sleep Apnea Anxiety Transient ischemic attack, hx of(06/18/2005) chronic cough DVT, hx of hip DJD - Dr Darrelyn Hillock glaucoma  Past Surgical History: Last updated: 01/04/2007 Cholecystectomy Hysterectomy(1999) Oophorectomy(1999)  Family History: Last updated: 09/25/2009 mother with dementia, breast cancer and lung cancer sister with stroke son with drug abuse no dm  Social History: Last updated: 09/25/2009 Never Smoked Alcohol use-no Married 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         all otherwise negative per pt -    Physical Exam  General:  alert and overweight-appearing.   Head:  normocephalic and atraumatic.   Eyes:  vision grossly intact, pupils equal, and pupils round.   Ears:  R ear normal and L ear normal.   Nose:  no external deformity and no nasal discharge.   Mouth:  no gingival abnormalities and pharynx pink and moist.   Neck:  supple and no masses.   Lungs:  normal respiratory effort and normal breath sounds.   Heart:  normal rate and regular rhythm.   Abdomen:  soft, non-tender, and normal bowel sounds.   Msk:  no joint tenderness and no joint swelling.   Extremities:  no edema, no erythema  Neurologic:  cranial nerves II-XII intact and strength normal in all extremities.     Impression & Recommendations:  Problem # 1:  Preventive Health Care (ICD-V70.0) Overall doing well, age appropriate education and counseling updated and referral for appropriate preventive services done unless declined, immunizations up to date or declined, diet counseling done if overweight, urged to quit smoking if smokes , most recent labs reviewed and current ordered if appropriate, ecg reviewed or declined (interpretation per ECG scanned in the EMR if done); information regarding Medicare Prevention requirements given if appropriate; speciality referrals updated as appropriate   Problem # 2:  DIZZINESS (ICD-780.4)  overcontroleld BP - to stop the coreg for now, I asked pt to  check dopplers, echo - declines due to cost  Complete Medication List: 1)  Coreg 6.25 Mg Tabs (Carvedilol) .Marland Kitchen.. 1po two times a day 2)  Pravastatin Sodium 40 Mg Tabs (Pravastatin sodium) .... Take 2 tablet by mouth once a day 3)  Actoplus Met 15-500 Mg Tabs (Pioglitazone hcl-metformin hcl) .Marland Kitchen.. 1 by mouth qd 4)  Benicar Hct 40-25 Mg Tabs (Olmesartan medoxomil-hctz) .Marland Kitchen.. 1 by mouth once daily 5)  Alprazolam 0.25 Mg Tabs (Alprazolam) .Marland Kitchen.. 1 by mouth two times a day as needed 6)  Indomethacin 25 Mg Caps (Indomethacin) .... Use ad as needed two times a day 7)  Lumigan 0.01 % Soln (Bimatoprost) .Marland Kitchen.. 1 gtt both eyes at night  Patient Instructions: 1)  stop the coreg 2)  Please call if any further episodes dizziness for: carotid artery ultrasound test, and the echocardiogram, adn possible cardiology referral 3)  Continue all previous medications as before this visit  4)  Please schedule a  follow-up appointment in 6 months with: 5)  BMP prior to visit, ICD-9: 250.02 6)  Lipid Panel prior to visit, ICD-9: 7)  HbgA1C prior to visit, ICD-9:

## 2010-07-15 NOTE — Assessment & Plan Note (Signed)
Summary: NEW ENDO/[employer]/$50/CD   Vital Signs:  Patient Profile:   65 Years Old Female Weight:      235.4 pounds O2 Sat:      97 % O2 treatment:    Room Air Temp:     97.5 degrees F oral Pulse rate:   92 / minute BP sitting:   128 / 62  (left arm) Cuff size:   large  Pt. in pain?   no  Vitals Entered By: Orlan Leavens (July 30, 2008 8:33 AM)              Is Patient Diabetic? Yes Did you bring your meter with you today? No CBG Result 134 CBG Device ID PT CHECK BS THIS AM     Referred by:  SELF PCP:  Corwin Levins MD  Chief Complaint:  NEW ENDO.  History of Present Illness: pt states 3 years of dm, complicated by cva.  on actos/met, cbg's vary widely, but average mid-100's.  pt has nocturnal numbness of hands, and associated pain.   says her diet is good, but exercise is poor.     Prior Medications Reviewed Using: Patient Recall  Prior Medication List:  COREG 12.5 MG TABS (CARVEDILOL) 1 tablet by mouth twice a day PRAVASTATIN SODIUM 40 MG TABS (PRAVASTATIN SODIUM) Take 2 tablet by mouth once a day ACTOPLUS MET 15-500 MG  TABS (PIOGLITAZONE HCL-METFORMIN HCL) 1 by mouth qd BENICAR HCT 40-25 MG  TABS (OLMESARTAN MEDOXOMIL-HCTZ) 1 by mouth qd ALPRAZOLAM 0.25 MG  TABS (ALPRAZOLAM) 1 by mouth once daily prn CEPHALEXIN 500 MG TABS (CEPHALEXIN) 1 by mouth tid MECLIZINE HCL 12.5 MG TABS (MECLIZINE HCL) 1 -2 by mouth q 6 hrs as needed dizzy   Updated Prior Medication List: COREG 12.5 MG TABS (CARVEDILOL) 1 tablet by mouth twice a day PRAVASTATIN SODIUM 40 MG TABS (PRAVASTATIN SODIUM) Take 2 tablet by mouth once a day ACTOPLUS MET 15-500 MG  TABS (PIOGLITAZONE HCL-METFORMIN HCL) 1 by mouth qd BENICAR HCT 40-25 MG  TABS (OLMESARTAN MEDOXOMIL-HCTZ) 1 by mouth qd ALPRAZOLAM 0.25 MG  TABS (ALPRAZOLAM) 1 by mouth once daily prn MECLIZINE HCL 12.5 MG TABS (MECLIZINE HCL) 1 -2 by mouth q 6 hrs as needed dizzy  Current Allergies: No known allergies   Past Medical History:  Reviewed history from 07/06/2007 and no changes required:       Diabetes mellitus, type II       GERD       Hyperlipidemia       Hypertension       Asthma, mild       Obesity       Obstructive Sleep Apnea       Anxiety       Transient ischemic attack, hx of(06/18/2005)       chronic cough       DVT, hx of   Family History:    Reviewed history from 07/06/2007 and no changes required:       mother with dementia       sister with stroke       son with drug abuse       no dm  Social History:    Reviewed history from 07/06/2007 and no changes required:       Never Smoked       Alcohol use-no       Married    Review of Systems       The patient complains of weight gain.  has leg cramps and anxiety no numbness of the feet denies headache, chest pain, sob, n/v, urinary frequency, excessive diaphoresis, memory loss, depression, hypoglycemia, bruising.  she has intermittent blurry vision    Physical Exam  General:     obese.   Head:     head: no deformity eyes: no periorbital swelling, no proptosis external nose and ears are normal mouth: no lesion seen  Neck:     no masses, thyromegaly, or abnormal cervical nodes Lungs:     clear bilaterally to A  Heart:     regular rate and rhythm, S1, S2 without murmurs, rubs, gallops, or clicks Msk:     no deformity or scoliosis noted with normal posture and gait Pulses:     dorsalis pedis intact bilat.  no carotid bruit  Extremities:     no deformity.  no ulcer on the feet.  feet are of normal color and temp.  no edema  Neurologic:     cn 2-12 grossly intact.   readily moves all 4's.   sensation is intact to touch on the feet  Skin:     normal texture and temp.  no rash.  not diaphoretic  Cervical Nodes:     no significant adenopathy Psych:     alert and cooperative; normal mood and affect; normal attention span and concentration Additional Exam:     VITAMIN B12               383 pg/mL                    211-911    SODIUM                    142 mEq/L                   135-145   POTASSIUM                 4.3 mEq/L                   3.5-5.1   CHLORIDE                  104 mEq/L                   96-112   CARBON DIOXIDE            31 mEq/L                    19-32   GLUCOSE              [H]  129 mg/dL                   81-19   BUN                       13 mg/dL                    1-47   CREATININE                0.8 mg/dL                   8.2-9.5   CALCIUM                   9.3 mg/dL  8.4-10.5   HGB A1C 6.9      FastTSH                   1.25 uIU/mL       Impression & Recommendations:  Problem # 1:  DIABETES MELLITUS, TYPE II (ICD-250.00)  Problem # 2:  NUMBNESS (ICD-782.0) prob due to carpal tunnel syndrome  Problem # 3:  TRANSIENT ISCHEMIC ATTACK, HX OF (ICD-V12.50)   Patient Instructions: 1)  we discussed the importance of diet and exercise therapy and the risk of diabetes 2)  i told pt we will need to take this complex situation in stages 3)  check your blood glucose 1 time a day.  vary the time of day between before the 3 meals and at bedtime.  also check if you feel as though your glucose might be very high or too low.  bring a record of this to your doctor appointments 4)  add  januvia 100 mg/day 5)  in view of h/o tia or cva, she should avoid drugs which cause hypoglycemia 6)  Please schedule a follow-up appointment in 3 months.

## 2010-08-18 ENCOUNTER — Other Ambulatory Visit: Payer: Self-pay | Admitting: Obstetrics and Gynecology

## 2010-08-18 DIAGNOSIS — R928 Other abnormal and inconclusive findings on diagnostic imaging of breast: Secondary | ICD-10-CM

## 2010-08-23 ENCOUNTER — Ambulatory Visit
Admission: RE | Admit: 2010-08-23 | Discharge: 2010-08-23 | Disposition: A | Discharge status: Home / Self Care | Payer: BC Managed Care – PPO | Source: Ambulatory Visit | Attending: Obstetrics and Gynecology | Admitting: Obstetrics and Gynecology

## 2010-08-23 DIAGNOSIS — R928 Other abnormal and inconclusive findings on diagnostic imaging of breast: Secondary | ICD-10-CM

## 2010-08-25 ENCOUNTER — Encounter (HOSPITAL_BASED_OUTPATIENT_CLINIC_OR_DEPARTMENT_OTHER)
Admission: RE | Admit: 2010-08-25 | Discharge: 2010-08-25 | Disposition: A | Discharge status: Home / Self Care | Payer: BC Managed Care – PPO | Source: Ambulatory Visit | Attending: Orthopedic Surgery | Admitting: Orthopedic Surgery

## 2010-08-25 LAB — BASIC METABOLIC PANEL
BUN: 10 mg/dL (ref 6–23)
CO2: 28 mEq/L (ref 19–32)
Calcium: 9.2 mg/dL (ref 8.4–10.5)
Chloride: 102 mEq/L (ref 96–112)
Creatinine, Ser: 0.73 mg/dL (ref 0.4–1.2)
GFR calc Af Amer: >60 mL/min (ref >60)
GFR calc non Af Amer: >60 mL/min (ref >60)
Glucose, Bld: 120 mg/dL — ABNORMAL HIGH (ref 70–99)
Potassium: 3.9 mEq/L (ref 3.5–5.1)
Sodium: 136 mEq/L (ref 135–145)

## 2010-08-30 ENCOUNTER — Ambulatory Visit (HOSPITAL_BASED_OUTPATIENT_CLINIC_OR_DEPARTMENT_OTHER)
Admission: RE | Admit: 2010-08-30 | Discharge: 2010-08-30 | Disposition: A | Discharge status: Home / Self Care | Payer: BC Managed Care – PPO | Source: Ambulatory Visit | Attending: Orthopedic Surgery | Admitting: Orthopedic Surgery

## 2010-08-30 DIAGNOSIS — M2240 Chondromalacia patellae, unspecified knee: Secondary | ICD-10-CM | POA: Insufficient documentation

## 2010-08-30 DIAGNOSIS — M23305 Other meniscus derangements, unspecified medial meniscus, unspecified knee: Secondary | ICD-10-CM | POA: Insufficient documentation

## 2010-08-30 DIAGNOSIS — Z01812 Encounter for preprocedural laboratory examination: Secondary | ICD-10-CM | POA: Insufficient documentation

## 2010-08-30 LAB — GLUCOSE, CAPILLARY
Glucose-Capillary: 108 mg/dL — ABNORMAL HIGH (ref 70–99)
Glucose-Capillary: 118 mg/dL — ABNORMAL HIGH (ref 70–99)

## 2010-08-30 LAB — POCT HEMOGLOBIN-HEMACUE: Hemoglobin: 12.4 g/dL (ref 12.0–15.0)

## 2010-09-02 NOTE — Op Note (Signed)
  [name], MCNEELY                 ACCOUNT NO.:  [id number]  MEDICAL RECORD NO.:  [id number]           PATIENT TYPE:  LOCATION:                                 FACILITY:  PHYSICIAN:  Feliberto Gottron. Turner Daniels, M.D.        DATE OF BIRTH:  DATE OF PROCEDURE:  08/30/2010 DATE OF DISCHARGE:                              OPERATIVE REPORT   PREOPERATIVE DIAGNOSIS:  Right knee medial meniscal tear.  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  PROCEDURE:  Right knee arthroscopic debridement of focal grade 4 chondromalacia, followed by microfracture and removal of fibrous band.  SURGEON:  Feliberto Gottron. Turner Daniels, MD  FIRST ASSISTANT:  Shirl Harris, PA-C  ANESTHETIC:  General LMA.  ESTIMATED BLOOD LOSS:  Minimal.  FLUID REPLACEMENT:  800 mL of crystalloid.  DRAINS PLACED:  None.  TOURNIQUET TIME:  None.  INDICATIONS FOR PROCEDURE:  A 65 year old woman with catching, popping, and pain in the medial aspect of her right knee.  She has failed conservative measures, physical therapy, anti-inflammatory medicine, exercises, cortisone injection, and now desires elective right knee arthroscopic evaluation and treatment.  Risks and benefits of surgery have been discussed, questions answered.  DESCRIPTION OF PROCEDURE:  The patient identified by armband, taken to operating room one.  Appropriate anesthetic monitors were attached. General LMA anesthesia induced with the patient in a supine position. She did receive preoperative IV antibiotics and does have a history of diabetes.  Lateral post applied to the table.  Right lower extremity prepped and draped in sterile fashion from the ankle to the midthigh. Time-out procedure performed.  We began the operation itself by making standard inferomedial and inferolateral peripatellar portals. Diagnostic arthroscopy revealed normal articular cartilage to  the patella and trochlea.  There was a 5 x 5 mm grade 4 chondromalacia medial femoral condyle anterodistally with flap tears.  This was debrided back to stable margin and then a 45-angled microfracture pick was used to bring in a blood supply.  The medial and lateral menisci were in excellent condition.  The articular cartilages of the tibial plateau and lateral femoral condyle were excellent.  ACL and PCL were intact.  The gutters were cleared medially and laterally.  There was a fibrous band noted medially that actually went over the area of grade 4 chondromalacia and this was also removed.  At this point, the knee was irrigated out with normal saline solution.  The arthroscopic instruments were removed.  Dressing of Xeroform, 4x4 dressing, sponges, Webril, and Ace wrap applied.  The patient was awakened, extubated, and taken to the recovery room without difficulty.     Feliberto Gottron. Turner Daniels, M.D.     Ovid Curd  D:  08/30/2010  T:  08/31/2010  Job:  [id number]  Electronically Signed by Gean Birchwood M.D. on 09/02/2010 07:40:47 AM

## 2010-10-18 ENCOUNTER — Other Ambulatory Visit: Payer: Self-pay

## 2010-10-18 MED ORDER — OLMESARTAN MEDOXOMIL-HCTZ 40-25 MG PO TABS: 1.0000 | ORAL_TABLET | Freq: Every day | ORAL | Status: DC

## 2010-10-20 DIAGNOSIS — J45909 Unspecified asthma, uncomplicated: Secondary | ICD-10-CM

## 2010-10-22 ENCOUNTER — Encounter: Payer: Self-pay | Admitting: Internal Medicine

## 2010-10-22 ENCOUNTER — Ambulatory Visit (INDEPENDENT_AMBULATORY_CARE_PROVIDER_SITE_OTHER): Payer: BC Managed Care – PPO | Admitting: Internal Medicine

## 2010-10-22 VITALS — BP 140/64 | HR 102 | Temp 97.9°F | Ht 64.0 in | Wt 238.4 lb

## 2010-10-22 DIAGNOSIS — Z0001 Encounter for general adult medical examination with abnormal findings: Secondary | ICD-10-CM | POA: Insufficient documentation

## 2010-10-22 DIAGNOSIS — E119 Type 2 diabetes mellitus without complications: Secondary | ICD-10-CM

## 2010-10-22 DIAGNOSIS — Z0000 Encounter for general adult medical examination without abnormal findings: Secondary | ICD-10-CM | POA: Insufficient documentation

## 2010-10-22 DIAGNOSIS — E785 Hyperlipidemia, unspecified: Secondary | ICD-10-CM

## 2010-10-22 MED ORDER — OLMESARTAN MEDOXOMIL-HCTZ 40-25 MG PO TABS: 1.0000 | ORAL_TABLET | Freq: Every day | ORAL | Status: DC

## 2010-10-22 MED ORDER — METFORMIN HCL ER 500 MG PO TB24: 1000.0000 mg | ORAL_TABLET | Freq: Every day | ORAL | Status: DC

## 2010-10-22 MED ORDER — ATORVASTATIN CALCIUM 10 MG PO TABS: 10.0000 mg | ORAL_TABLET | Freq: Every day | ORAL | Status: DC

## 2010-10-22 MED ORDER — CARVEDILOL 6.25 MG PO TABS: 6.2500 mg | ORAL_TABLET | Freq: Two times a day (BID) | ORAL | Status: DC

## 2010-10-22 MED ORDER — ALPRAZOLAM 0.25 MG PO TABS: 0.2500 mg | ORAL_TABLET | Freq: Two times a day (BID) | ORAL | Status: DC

## 2010-10-22 NOTE — Progress Notes (Signed)
Subjective:    Patient ID: [name], female    DOB: Jun 03, 1946, 65 y.o.   MRN: [id number]  HPI Here for wellness and f/u;  Overall doing ok;  Pt denies CP, worsening SOB, DOE, wheezing, orthopnea, PND, worsening LE edema, palpitations, dizziness or syncope.  Pt denies neurological change such as new Headache, facial or extremity weakness.  Pt denies polydipsia, polyuria, or low sugar symptoms. Pt states overall good compliance with treatment and medications, good tolerability, and trying to follow lower cholesterol diet.  Pt denies worsening depressive symptoms, suicidal ideation or panic though more stress recently and tense today. No fever, wt loss, night sweats, loss of appetite, or other constitutional symptoms.  Pt states good ability with ADL's, low fall risk, home safety reviewed and adequate, no significant changes in hearing or vision, and occasionally active with exercise. Ran out and not taking the actosplusmet and does not want to take due to neg ads on TV.  Stopped her pravastatin for right knee arthrscopy mar 19, felt better off the medicaiton, and does not want to re-start at this time. States a1c on all meds was 5.8 about 2 mo ago with GYN  - had pap and mammogram.  For some reason not taking ASA with her hx of TIA.    Past Medical History  Diagnosis Date  . DIABETES MELLITUS, TYPE II 01/04/2007  . HYPERLIPIDEMIA 01/04/2007  . Overweight 01/04/2007  . ANXIETY 01/04/2007  . SLEEP APNEA, OBSTRUCTIVE 01/04/2007  . GLAUCOMA 07/30/2008  . OTITIS MEDIA, ACUTE, BILATERAL 02/29/2008  . HYPERTENSION 01/04/2007  . ASTHMA 01/04/2007  . GERD 01/04/2007  . OSTEOARTHRITIS, HIP 09/25/2009  . LEG PAIN, LEFT 07/06/2007  . Dizziness and giddiness 02/29/2008  . NUMBNESS 07/30/2008  . TRANSIENT ISCHEMIC ATTACK, HX OF 01/04/2007  . DVT, HX OF 07/06/2007   Past Surgical History  Procedure Date  . Cholecystectomy   . Abdominal hysterectomy 1999  . Oophorectomy     reports that she has never smoked.  She does not have any smokeless tobacco history on file. She reports that she does not drink alcohol or use illicit drugs. family history includes Cancer in her mother; Dementia in her mother; and Stroke in her sister. Allergies  Allergen Reactions  . Sitagliptin Phosphate    Current Outpatient Prescriptions on File Prior to Visit  Medication Sig Dispense Refill  . olmesartan-hydrochlorothiazide (BENICAR HCT) 40-25 MG per tablet Take 1 tablet by mouth daily.  30 tablet  1  . ALPRAZolam (XANAX) 0.25 MG tablet Take 0.25 mg by mouth 2 (two) times daily at 10 AM and 5 PM.        . Bimatoprost (LUMIGAN) 0.01 % SOLN 1 drop both eyes at night       . carvedilol (COREG) 6.25 MG tablet Take 6.25 mg by mouth 2 (two) times daily at 10 AM and 5 PM.        . indomethacin (INDOCIN) 25 MG capsule Take 25 mg by mouth 2 (two) times daily as needed.        . pioglitazone-metformin (ACTOPLUS MET) 15-500 MG per tablet Take 1 tablet by mouth daily.        . pravastatin (PRAVACHOL) 40 MG tablet Take 40 mg by mouth 2 (two) times daily.         Review of Systems Review of Systems  Constitutional: Negative for diaphoresis, activity change, appetite change and unexpected weight change.  HENT: Negative for hearing loss, ear pain, facial swelling, mouth sores and  neck stiffness.   Eyes: Negative for pain, redness and visual disturbance.  Respiratory: Negative for shortness of breath and wheezing.   Cardiovascular: Negative for chest pain and palpitations.  Gastrointestinal: Negative for diarrhea, blood in stool, abdominal distention and rectal pain.  Genitourinary: Negative for hematuria, flank pain and decreased urine volume.  Musculoskeletal: Negative for myalgias and joint swelling.  Skin: Negative for color change and wound.  Neurological: Negative for syncope and numbness.  Hematological: Negative for adenopathy.  Psychiatric/Behavioral: Negative for hallucinations, self-injury, decreased concentration and  agitation.      Objective:   Physical Exam BP 140/64  Pulse 102  Temp(Src) 97.9 F (36.6 C) (Oral)  Ht 5\' 4"  (1.626 m)  Wt 238 lb 6 oz (108.126 kg)  BMI 40.92 kg/m2  SpO2 96% Physical Exam  VS noted Constitutional: Pt is oriented to person, place, and time. Appears well-developed and well-nourished.  HENT:  Head: Normocephalic and atraumatic.  Right Ear: External ear normal.  Left Ear: External ear normal.  Nose: Nose normal.  Mouth/Throat: Oropharynx is clear and moist.  Eyes: Conjunctivae and EOM are normal. Pupils are equal, round, and reactive to light.  Neck: Normal range of motion. Neck supple. No JVD present. No tracheal deviation present.  Cardiovascular: Normal rate, regular rhythm, normal heart sounds and intact distal pulses.   Pulmonary/Chest: Effort normal and breath sounds normal.  Abdominal: Soft. Bowel sounds are normal. There is no tenderness.  Musculoskeletal: Normal range of motion. Exhibits no edema.  Lymphadenopathy:  Has no cervical adenopathy.  Neurological: Pt is alert and oriented to person, place, and time. Pt has normal reflexes. No cranial nerve deficit.  Skin: Skin is warm and dry. No rash noted.  Psychiatric:  Has  normal mood and affect. Behavior is normal.  2+ nervous        Assessment & Plan:

## 2010-10-22 NOTE — Patient Instructions (Signed)
Please stop the actosplus met as you have Please stop the pravastatin Start the metformin 500 mg ER - 2 in the AM Start the lipitor 10 mg per day Please return for LAB only in 3 months (physical labs and A1C) Please return in 9 months for Office Visit with labs done 3-5 days before (glucose and cholesterol labs)

## 2010-10-22 NOTE — Assessment & Plan Note (Signed)
Overall doing well, age appropriate education and counseling updated, referrals for preventative services and immunizations addressed, dietary and smoking counseling addressed, most recent labs and ECG reviewed.  I have personally reviewed and have noted: 1) the patient's medical and social history 2) The pt's use of alcohol, tobacco, and illicit drugs 3) The patient's current medications and supplements 4) Functional ability including ADL's, fall risk, home safety risk, hearing and visual impairment 5) Diet and physical activities 6) Evidence for depression or mood disorder 7) The patient's height, weight, and BMI have been recorded in the chart I have made referrals, and provided counseling and education based on review of the above Declines colonscopy and tetanus today

## 2010-10-24 ENCOUNTER — Encounter: Payer: Self-pay | Admitting: Internal Medicine

## 2010-10-24 NOTE — Assessment & Plan Note (Signed)
Ok to adjust med, f/u with labs in 3 mo, then next visit as well  Lab Results  Component Value Date   HGBA1C 6.7* 10/29/2009

## 2010-10-24 NOTE — Assessment & Plan Note (Signed)
Ok to change to lipitor 10 mg, f/u labs approx 3 mo, then next visit as well

## 2010-10-29 NOTE — Consult Note (Signed)
[name], [name]                 ACCOUNT NO.:  [id number]   MEDICAL RECORD NO.:  [id number]          PATIENT TYPE:  EMS   LOCATION:  [facility]                         FACILITY:  [facility]   PHYSICIAN:  Rosalyn Gess. Norins, M.D. [facility] OF BIRTH:  [birth year]   DATE OF CONSULTATION:  06/18/2005  DATE OF DISCHARGE:                                   CONSULTATION   I was asked by Dr. Oletta Lamas from the emergency department to see and evaluate  [name] for TIA.   HISTORY OF PRESENT ILLNESS:  [name] is a pleasant 65 year old Caucasian  woman, who has been followed by Dr. Efrain Sella with a history of hypertension.   The patient reports that this morning about 9 o'clock she had the onset of  left arm weakness which lasted for 20-30 minutes.  She also had transient  problems with loss of sensation, weakness in her left leg, and a little  difficulty with expressive aphasia, which all resolved very quickly.  Because of these symptoms the patient [facility]  From there she was referred to the [facility] Emergency Department  for possible brain tap evaluation.  The patient's symptoms resolved at home  prior to being seen at [facility] and have not recurred.   PAST MEDICAL HISTORY:  Surgical:  None reported.   Medical:  1.  Usual childhood diseases.  2.  GERD.  3.  Mild persistent asthma.  4.  Obesity with question sleep apnea.  5.  Hypertension.   FAMILY HISTORY:  Noncontributory.   SOCIAL HISTORY:  The patient lives with her husband who is very supportive.   REVIEW OF SYSTEMS:  The patient has had no constitutional symptoms, no  cardiovascular or GI problems.  No pulmonary problems.   PHYSICAL EXAMINATION:  VITAL SIGNS:  Temperature was 98.1, blood pressure  178/73, heart rate was 90, respirations 20, oxygen saturation was 100% on  room air.  GENERAL:  This is an obese Caucasian woman lying on a hospital stretcher, no  acute distress.  HEENT:  Normocephalic,  atraumatic.  Unremarkable.  NECK:  Supple with full range of motion.  No thyromegaly appreciated.  CHEST:  Clear with no rales, wheezes, or rhonchi.  BREASTS:  Exam deferred.  CARDIOVASCULAR:  2+ radial pulses.  No JVD.  No carotid bruits.  She had a  quiet precordium with regular rate and rhythm without murmurs, rubs, or  gallops.  ABDOMEN:  Obese, soft.  No guarding or rebound was noted.  NEUROLOGIC:  The patient is awake and alert, oriented to person, place,  time, and context.  Cranial nerves II-XII are grossly intact with normal  facial symmetry and muscle movement.  Extraocular muscles were intact.  Pupils equal, round, and reactive to light and accommodation.  There is no  deviation of the tongue or uvula.  She has normal shoulder shrug.  Motor  strength was 5/5 and symmetrical.  DTRs were 2+ and symmetrical.  Sensation  was well-preserved.  Cerebellar function was unremarkable.   LABORATORY DATA:  Database:  CT  scan of the brain without contrast was  unremarkable with no signs of hemorrhage, intracranial mass, or old injury.   ASSESSMENT/PLAN:  1.  Neurologic.  Patient with transient episode of left arm weakness with a      very, very brief episode of left-sided weakness which resolved quickly.      Suspect this may be hypertensive related event.  The patient is totally      asymptomatic at this time.   Discussed with the patient and her husband risks and benefits of admission  versus being discharged home for outpatient follow-up.  The patient is not a  candidate for heparin but would be a candidate for aspirin therapy only.  Risk is minimal for going home and to return for further evaluation.  Plan:  The patient started on aspirin 325 mg daily.  She will be set up as an  outpatient for carotid Doppler studies, 2-D echo.  1.  Hypertension.  The patient has a long-standing history of hypertension.      She was involved in a diabetic study and medications were stopped.  Old       records indicate she was on hydrochlorothiazide, Lotrel, and Benicar.      Plan:  The patient was restarted on hydrochlorothiazide and Benicar at      20 mg daily.  She is to follow up with Dr. Efrain Sella in 7-10 days for      evaluation of studies above as well as to monitor her blood pressure.   DISPOSITION:  The patient will be discharged home.  She is carefully  instructed to return at the first onset of any neurologic symptom.  She will  be contacted by our office in regards to scheduling the 2-D echo and carotid  Dopplers and a follow-up appointment with Dr. Efrain Sella.           ______________________________  Rosalyn Gess. Norins, M.D. [facility]     MEN/MEDQ  D:  06/18/2005  T:  06/19/2005  Job:  [id number]   cc:   Jonita Albee, M.D.  Fax: [phone]   Corwin Levins, M.D. [facility]  [address]  [place]  [place] [address]

## 2010-11-10 ENCOUNTER — Telehealth: Payer: Self-pay

## 2010-11-10 MED ORDER — SIMVASTATIN 40 MG PO TABS: 40.0000 mg | ORAL_TABLET | Freq: Every evening | ORAL | Status: DC

## 2010-11-10 NOTE — Telephone Encounter (Signed)
Ok to stop the metformin, and lipitor (I added to allergy list)  Ok for simvastatin 40 mg - I sent to pharmacy per [employer]

## 2010-11-10 NOTE — Telephone Encounter (Signed)
Pt called stating that she has been experiencing severe diarrhea with Metformin and severe muscle pain and cramps with Lipitor. Pt stopped taking both medication Friday 05/25 and feels much better without it and would like to stay off Metformin/ actos plus met altogether and go back to Simvastatin, please advise.

## 2010-11-11 NOTE — Telephone Encounter (Signed)
Pt advised.

## 2011-02-18 ENCOUNTER — Other Ambulatory Visit (INDEPENDENT_AMBULATORY_CARE_PROVIDER_SITE_OTHER): Payer: Medicare Other

## 2011-02-18 ENCOUNTER — Other Ambulatory Visit: Payer: Self-pay | Admitting: Internal Medicine

## 2011-02-18 ENCOUNTER — Ambulatory Visit (INDEPENDENT_AMBULATORY_CARE_PROVIDER_SITE_OTHER): Payer: Medicare Other | Admitting: Internal Medicine

## 2011-02-18 ENCOUNTER — Telehealth: Payer: Self-pay

## 2011-02-18 ENCOUNTER — Encounter: Payer: Self-pay | Admitting: Internal Medicine

## 2011-02-18 DIAGNOSIS — Z0000 Encounter for general adult medical examination without abnormal findings: Secondary | ICD-10-CM

## 2011-02-18 DIAGNOSIS — I10 Essential (primary) hypertension: Secondary | ICD-10-CM

## 2011-02-18 DIAGNOSIS — E119 Type 2 diabetes mellitus without complications: Secondary | ICD-10-CM

## 2011-02-18 DIAGNOSIS — E785 Hyperlipidemia, unspecified: Secondary | ICD-10-CM

## 2011-02-18 LAB — CBC WITH DIFFERENTIAL/PLATELET
Basophils Absolute: 0 10*3/uL (ref 0.0–0.1)
Basophils Relative: 0.2 % (ref 0.0–3.0)
Eosinophils Absolute: 0.2 10*3/uL (ref 0.0–0.7)
Eosinophils Relative: 1.7 % (ref 0.0–5.0)
HCT: 38.6 % (ref 36.0–46.0)
Hemoglobin: 12.9 g/dL (ref 12.0–15.0)
Lymphocytes Relative: 20.9 % (ref 12.0–46.0)
Lymphs Abs: 1.9 10*3/uL (ref 0.7–4.0)
MCHC: 33.5 g/dL (ref 30.0–36.0)
MCV: 82.8 fl (ref 78.0–100.0)
Monocytes Absolute: 0.6 10*3/uL (ref 0.1–1.0)
Monocytes Relative: 6 % (ref 3.0–12.0)
Neutro Abs: 6.6 10*3/uL (ref 1.4–7.7)
Neutrophils Relative %: 71.2 % (ref 43.0–77.0)
Platelets: 282 10*3/uL (ref 150.0–400.0)
RBC: 4.66 Mil/uL (ref 3.87–5.11)
RDW: 15 % — ABNORMAL HIGH (ref 11.5–14.6)
WBC: 9.2 10*3/uL (ref 4.5–10.5)

## 2011-02-18 LAB — LIPID PANEL
Cholesterol: 196 mg/dL (ref 0–200)
HDL: 53.7 mg/dL (ref >39.00)
LDL Cholesterol: 121 mg/dL — ABNORMAL HIGH (ref 0–99)
Total CHOL/HDL Ratio: 4
Triglycerides: 105 mg/dL (ref 0.0–149.0)
VLDL: 21 mg/dL (ref 0.0–40.0)

## 2011-02-18 LAB — BASIC METABOLIC PANEL
BUN: 21 mg/dL (ref 6–23)
CO2: 23 mEq/L (ref 19–32)
Calcium: 9.1 mg/dL (ref 8.4–10.5)
Chloride: 109 mEq/L (ref 96–112)
Creatinine, Ser: 0.9 mg/dL (ref 0.4–1.2)
GFR: 66.73 mL/min (ref >60.00)
Glucose, Bld: 146 mg/dL — ABNORMAL HIGH (ref 70–99)
Potassium: 4 mEq/L (ref 3.5–5.1)
Sodium: 141 mEq/L (ref 135–145)

## 2011-02-18 LAB — HEMOGLOBIN A1C: Hgb A1c MFr Bld: 7.3 % — ABNORMAL HIGH (ref 4.6–6.5)

## 2011-02-18 LAB — HEPATIC FUNCTION PANEL
ALT: 16 U/L (ref 0–35)
AST: 13 U/L (ref 0–37)
Albumin: 4.2 g/dL (ref 3.5–5.2)
Alkaline Phosphatase: 67 U/L (ref 39–117)
Bilirubin, Direct: 0.1 mg/dL (ref 0.0–0.3)
Total Bilirubin: 0.5 mg/dL (ref 0.3–1.2)
Total Protein: 7.4 g/dL (ref 6.0–8.3)

## 2011-02-18 LAB — URINALYSIS, ROUTINE W REFLEX MICROSCOPIC
Bilirubin Urine: NEGATIVE
Hgb urine dipstick: NEGATIVE
Ketones, ur: NEGATIVE
Leukocytes, UA: NEGATIVE
Nitrite: NEGATIVE
Specific Gravity, Urine: 1.01 (ref 1.000–1.030)
Total Protein, Urine: NEGATIVE
Urine Glucose: NEGATIVE
Urobilinogen, UA: 0.2 (ref 0.0–1.0)
pH: 7.5 (ref 5.0–8.0)

## 2011-02-18 LAB — MICROALBUMIN / CREATININE URINE RATIO
Creatinine,U: 130.9 mg/dL
Microalb Creat Ratio: 0.3 mg/g (ref 0.0–30.0)
Microalb, Ur: 0.4 mg/dL (ref 0.0–1.9)

## 2011-02-18 LAB — TSH: TSH: 0.97 u[IU]/mL (ref 0.35–5.50)

## 2011-02-18 MED ORDER — PRAVASTATIN SODIUM 40 MG PO TABS: 40.0000 mg | ORAL_TABLET | Freq: Every evening | ORAL | Status: DC

## 2011-02-18 MED ORDER — METFORMIN HCL 500 MG PO TABS: 500.0000 mg | ORAL_TABLET | Freq: Every day | ORAL | Status: DC

## 2011-02-18 MED ORDER — BIMATOPROST 0.01 % OP SOLN: 1.0000 [drp] | Freq: Every day | OPHTHALMIC | Status: DC

## 2011-02-18 NOTE — Progress Notes (Signed)
Subjective:    Patient ID: [name], female    DOB: 08-Jul-1945, 65 y.o.   MRN: [id number]  HPI  Here to f/u; overall doing ok,  Pt denies chest pain, increased sob or doe, wheezing, orthopnea, PND, increased LE swelling, palpitations, dizziness or syncope.  Pt denies new neurological symptoms such as new headache, or facial or extremity weakness or numbness   Pt denies polydipsia, polyuria, or low sugar symptoms such as weakness or confusion improved with po intake.  Pt states overall good compliance with meds, trying to follow lower cholesterol, diabetic diet,  but little exercise however due to right hip pain ongoing.  Note;  Alma Friendly  - could not tolerate, and actos - concerned about long term SE's. Has tolerated metformin in the past if need back on it, has not tried tadjenta  Has lost 10 lbs by our scales since approx last visit, not taking the coreg due to dizziness, and has been trying off the zocor  - working on diet alone, scared about taking too many meds.  Denies worsening depressive symptoms, suicidal ideation, or panic, though has ongoing anxiety, not increased recently.   Had recnet eye exam with elev left eye pressure, recent laser eye surgury no help, and referred here as it is felt her DM may have worsened and a factor in the left eye problem.  May need eye surgury, she doesn't think any vision loss so far, still 20/60 vision per pt, same for yrs.  Seeing Dr Melene Muller at [facility].  Is s/p recent right knee arthroscopy, and will be on for right hip surgury in October 2012 - [facility], dr Turner Daniels (also did her knee) Past Medical History  Diagnosis Date  . DIABETES MELLITUS, TYPE II 01/04/2007  . HYPERLIPIDEMIA 01/04/2007  . Overweight 01/04/2007  . ANXIETY 01/04/2007  . SLEEP APNEA, OBSTRUCTIVE 01/04/2007  . GLAUCOMA 07/30/2008  . OTITIS MEDIA, ACUTE, BILATERAL 02/29/2008  . HYPERTENSION 01/04/2007  . ASTHMA 01/04/2007  . GERD 01/04/2007  . OSTEOARTHRITIS, HIP 09/25/2009  .  LEG PAIN, LEFT 07/06/2007  . Dizziness and giddiness 02/29/2008  . NUMBNESS 07/30/2008  . TRANSIENT ISCHEMIC ATTACK, HX OF 01/04/2007  . DVT, HX OF 07/06/2007   Past Surgical History  Procedure Date  . Cholecystectomy   . Abdominal hysterectomy 1999  . Oophorectomy     reports that she has never smoked. She does not have any smokeless tobacco history on file. She reports that she does not drink alcohol or use illicit drugs. family history includes Cancer in her mother; Dementia in her mother; and Stroke in her sister. Allergies  Allergen Reactions  . Lipitor (Atorvastatin Calcium)     Leg cramp  . Metformin And Related Diarrhea  . Sitagliptin Phosphate    Current Outpatient Prescriptions on File Prior to Visit  Medication Sig Dispense Refill  . olmesartan-hydrochlorothiazide (BENICAR HCT) 40-25 MG per tablet Take 1 tablet by mouth daily.  90 tablet  3  . ALPRAZolam (XANAX) 0.25 MG tablet Take 1 tablet (0.25 mg total) by mouth 2 (two) times daily at 10 AM and 5 PM.  60 tablet  2   Review of Systems Review of Systems  Constitutional: Negative for diaphoresis and unexpected weight change.  HENT: Negative for drooling and tinnitus.   Eyes: Negative for photophobia and visual disturbance.  Respiratory: Negative for choking and stridor.   Gastrointestinal: Negative for vomiting and blood in stool.  Genitourinary: Negative for hematuria and decreased urine volume. Marland Kitchen  Objective:   Physical Exam BP 110/62  Pulse 84  Temp(Src) 97.9 F (36.6 C) (Oral)  Ht 5\' 4"  (1.626 m)  Wt 228 lb (103.42 kg)  BMI 39.14 kg/m2  SpO2 95% Physical Exam  VS noted, obese, irritable Constitutional: Pt appears well-developed and well-nourished.  HENT: Head: Normocephalic.  Right Ear: External ear normal.  Left Ear: External ear normal.  Eyes: Conjunctivae and EOM are normal. Pupils are equal, round, and reactive to light.  Neck: Normal range of motion. Neck supple.  Cardiovascular: Normal rate and  regular rhythm.   Pulmonary/Chest: Effort normal and breath sounds normal.  Abd:  Soft, NT, non-distended, + BS Neurological: Pt is alert. No cranial nerve deficit.  Skin: Skin is warm. No erythema.  Psychiatric: Pt behavior is normal. Thought content normal. 1+ nervous        Assessment & Plan:

## 2011-02-18 NOTE — Patient Instructions (Addendum)
Continue all other medications as before Please go to LAB in the Basement for the blood and/or urine tests to be done today Please call the phone number [phone] (the PhoneTree System) for results of testing in 2-3 days;  When calling, simply dial the number, and when prompted enter the MRN number above (the Medical Record Number) and the # key, then the message should start. Please return in 6 mo with Lab testing done 3-5 days before We will fax lab [facility] later today

## 2011-02-18 NOTE — Telephone Encounter (Signed)
Faxed labs to Dr. Harlon Flor at [phone]

## 2011-02-19 ENCOUNTER — Encounter: Payer: Self-pay | Admitting: Internal Medicine

## 2011-02-19 NOTE — Assessment & Plan Note (Signed)
stable overall by hx and exam, most recent data reviewed with pt, and pt to continue medical treatment as before, and focus on diet, wt loss, if a1c > 7 may need OHA re-start

## 2011-02-19 NOTE — Assessment & Plan Note (Signed)
stable overall by hx and exam, most recent data reviewed with pt, and pt to continue medical treatment as before  BP Readings from Last 3 Encounters:  02/18/11 110/62  10/22/10 140/64  11/05/09 118/60

## 2011-02-19 NOTE — Assessment & Plan Note (Signed)
stable overall by hx and exam, most recent data reviewed with pt, and pt to continue medical treatment as before  Lab Results  Component Value Date   LDLCALC 121* 02/18/2011   Consider re-start pravachol after lipid check today for ldl > 70

## 2011-04-04 ENCOUNTER — Ambulatory Visit (HOSPITAL_COMMUNITY)
Admission: RE | Admit: 2011-04-04 | Discharge: 2011-04-04 | Disposition: A | Discharge status: Home / Self Care | Payer: Medicare Other | Source: Ambulatory Visit | Attending: Ophthalmology | Admitting: Ophthalmology

## 2011-04-04 ENCOUNTER — Encounter (HOSPITAL_COMMUNITY)
Admission: RE | Admit: 2011-04-04 | Discharge: 2011-04-04 | Disposition: A | Discharge status: Home / Self Care | Payer: Medicare Other | Source: Ambulatory Visit | Attending: Ophthalmology | Admitting: Ophthalmology

## 2011-04-04 ENCOUNTER — Other Ambulatory Visit (HOSPITAL_COMMUNITY): Payer: Self-pay | Admitting: Ophthalmology

## 2011-04-04 DIAGNOSIS — Z01818 Encounter for other preprocedural examination: Secondary | ICD-10-CM | POA: Insufficient documentation

## 2011-04-04 DIAGNOSIS — Z01812 Encounter for preprocedural laboratory examination: Secondary | ICD-10-CM | POA: Insufficient documentation

## 2011-04-04 DIAGNOSIS — I10 Essential (primary) hypertension: Secondary | ICD-10-CM | POA: Insufficient documentation

## 2011-04-04 DIAGNOSIS — H409 Unspecified glaucoma: Secondary | ICD-10-CM

## 2011-04-04 LAB — BASIC METABOLIC PANEL
BUN: 27 mg/dL — ABNORMAL HIGH (ref 6–23)
CO2: 26 mEq/L (ref 19–32)
Calcium: 9.6 mg/dL (ref 8.4–10.5)
Chloride: 103 mEq/L (ref 96–112)
Creatinine, Ser: 0.97 mg/dL (ref 0.50–1.10)
GFR calc Af Amer: 70 mL/min — ABNORMAL LOW (ref >90)
GFR calc non Af Amer: 60 mL/min — ABNORMAL LOW (ref >90)
Glucose, Bld: 159 mg/dL — ABNORMAL HIGH (ref 70–99)
Potassium: 3.6 mEq/L (ref 3.5–5.1)
Sodium: 139 mEq/L (ref 135–145)

## 2011-04-04 LAB — CBC
HCT: 35.4 % — ABNORMAL LOW (ref 36.0–46.0)
Hemoglobin: 12 g/dL (ref 12.0–15.0)
MCH: 27.7 pg (ref 26.0–34.0)
MCHC: 33.9 g/dL (ref 30.0–36.0)
MCV: 81.8 fL (ref 78.0–100.0)
Platelets: 209 10*3/uL (ref 150–400)
RBC: 4.33 MIL/uL (ref 3.87–5.11)
RDW: 14.4 % (ref 11.5–15.5)
WBC: 7.9 10*3/uL (ref 4.0–10.5)

## 2011-04-04 LAB — SURGICAL PCR SCREEN
MRSA, PCR: NEGATIVE
Staphylococcus aureus: NEGATIVE

## 2011-04-06 ENCOUNTER — Ambulatory Visit (HOSPITAL_COMMUNITY)
Admission: RE | Admit: 2011-04-06 | Discharge: 2011-04-06 | Disposition: A | Discharge status: Home / Self Care | Payer: Medicare Other | Source: Ambulatory Visit | Attending: Ophthalmology | Admitting: Ophthalmology

## 2011-04-06 DIAGNOSIS — E669 Obesity, unspecified: Secondary | ICD-10-CM | POA: Insufficient documentation

## 2011-04-06 DIAGNOSIS — E119 Type 2 diabetes mellitus without complications: Secondary | ICD-10-CM | POA: Insufficient documentation

## 2011-04-07 LAB — GLUCOSE, CAPILLARY
Glucose-Capillary: 123 mg/dL — ABNORMAL HIGH (ref 70–99)
Glucose-Capillary: 111 mg/dL — ABNORMAL HIGH (ref 70–99)
Glucose-Capillary: 141 mg/dL — ABNORMAL HIGH (ref 70–99)

## 2011-04-16 NOTE — Op Note (Signed)
[name], [name]                 ACCOUNT NO.:  [id number]  MEDICAL RECORD NO.:  [id number]  LOCATION:                                 FACILITY:  PHYSICIAN:  Chalmers Guest, M.D.     DATE OF BIRTH:  05/29/1946  DATE OF PROCEDURE:  04/06/2011 DATE OF DISCHARGE:                              OPERATIVE REPORT   PREOPERATIVE DIAGNOSIS:  Uncontrolled open angle glaucpoma, severely elevated intra-ocular pressure, recently associated with ocular inflammation following eye infection.Infection resolved.  POSTOPERATIVE DIAGNOSIS:  Uncontrolled open angle glaucoma, severely elevated intra-ocular pressure,recently associated with ocular inflammation following eye infection. Infection resolved.  PROCEDURE:  An Ex-PRESS mini tube shunt with mitomycin C, left eye.  COMPLICATIONS:  None.  ANESTHESIA:  Xylocaine 2% with 50:50 mixture of 0.75% Marcaine with ampule of Wydase with epinephrine.  PROCEDURE:  The patient was taken to the operating room, where under monitored anesthesia she was given a peribulbar block with the aforementioned local anesthetic agent.  Following this, the patient's face was prepped and draped in usual sterile fashion.  Procedure was done on the left eye.  With the surgeon sitting superiorly, a 6-0 silk suture was passed through clear cornea to infraduct the eye.  With the eye in intraductal position, the patient felt some pain.  Therefore, topical tetracaine, topical block was applied to the eye.  It was noted that the was 3+ injected.  A Hoskins forceps was used to grasp the conjunctiva superiorly and dissection was carried out at the limbus using blunt Westcott scissors forming a fornix-based conjunctival flap. Bleeding was controlled with cautery.  Following this, a tip blade was used to recess some tenon fibers.  A 45-degree blade was used to fashion a half-thickness scleral flap with the base of 4 mm.  The Maumenee- Colibri forceps was used to elevate the scleral  flap and a Grieshaber blade was used to dissect up to the corneoscleral limbus.  Following this, mitomycin C 0.4 mg/mL was placed on a Gelfoam sponge and placed under the scleral flap and under the  conjunctiva for 4 minutes and then removed and irrigated with 40 mL of balanced salt solution.  Following this, a paracentesis tract was performed with a blade at the 3:30 position injecting Provisc in the eye.  Following this, the scleral flap was again elevated and a 25-gauge needle was passed into the anterior chamber and a small amount of Provisc was injected in the eye.  Then, the Ex-PRESS mini shunt on a preloaded EDS P200 shunt, SN #[id number] was passed on the track made by the 25-gauge needle and rotated into the eye.  It fixated very well.  The scleral flap was sutured with 4 interrupted 10-0 nylon sutures.  The conjunctiva was then sutured with a 9-0 nylon suture on a BV100 needle in each corner and secured.  BSS was injected to remove the viscoelastic from the eye.  The incision site, superior limbus, and conjunctiva was all Seidelnegative.  A subconjunctival injection of Kenalog 4 mg was given subconjunctivally in the inferior nasal conjunctiva.  Topical TobraDex ointment was applied to the eye, patch and Fox shield were placed, and the patient returned to  recovery area in stable condition.     Chalmers Guest, M.D.     RW/MEDQ  D:  04/06/2011  T:  04/07/2011  Job:  [id number]  Electronically Signed by Chalmers Guest M.D. on 04/16/2011 11:16:25 AM

## 2011-05-02 ENCOUNTER — Telehealth: Payer: Self-pay

## 2011-05-02 MED ORDER — LISINOPRIL-HYDROCHLOROTHIAZIDE 20-12.5 MG PO TABS: 2.0000 | ORAL_TABLET | Freq: Every day | ORAL | Status: DC

## 2011-05-02 NOTE — Telephone Encounter (Signed)
The patients medicare supplement will not pay for Benicar, please alternative that they will pay for. [facility] [place].

## 2011-05-02 NOTE — Telephone Encounter (Signed)
Ok to change to lis-hct - done per [employer]

## 2011-05-02 NOTE — Telephone Encounter (Signed)
Patient informed. 

## 2011-05-30 ENCOUNTER — Encounter (INDEPENDENT_AMBULATORY_CARE_PROVIDER_SITE_OTHER): Payer: Medicare Other | Admitting: Ophthalmology

## 2011-05-30 DIAGNOSIS — E11319 Type 2 diabetes mellitus with unspecified diabetic retinopathy without macular edema: Secondary | ICD-10-CM

## 2011-05-30 DIAGNOSIS — H43819 Vitreous degeneration, unspecified eye: Secondary | ICD-10-CM

## 2011-05-30 DIAGNOSIS — E1139 Type 2 diabetes mellitus with other diabetic ophthalmic complication: Secondary | ICD-10-CM

## 2011-05-30 DIAGNOSIS — E1165 Type 2 diabetes mellitus with hyperglycemia: Secondary | ICD-10-CM

## 2011-05-30 DIAGNOSIS — H33309 Unspecified retinal break, unspecified eye: Secondary | ICD-10-CM

## 2011-05-30 DIAGNOSIS — H2510 Age-related nuclear cataract, unspecified eye: Secondary | ICD-10-CM

## 2011-06-09 ENCOUNTER — Encounter (HOSPITAL_COMMUNITY): Payer: Self-pay

## 2011-06-14 HISTORY — PX: EYE SURGERY: SHX253

## 2011-06-16 ENCOUNTER — Other Ambulatory Visit: Payer: Self-pay | Admitting: Orthopedic Surgery

## 2011-06-20 ENCOUNTER — Ambulatory Visit (INDEPENDENT_AMBULATORY_CARE_PROVIDER_SITE_OTHER): Payer: 59 | Admitting: Ophthalmology

## 2011-06-20 DIAGNOSIS — H33309 Unspecified retinal break, unspecified eye: Secondary | ICD-10-CM

## 2011-06-20 DIAGNOSIS — E11319 Type 2 diabetes mellitus with unspecified diabetic retinopathy without macular edema: Secondary | ICD-10-CM

## 2011-06-20 DIAGNOSIS — E1139 Type 2 diabetes mellitus with other diabetic ophthalmic complication: Secondary | ICD-10-CM

## 2011-06-22 ENCOUNTER — Encounter (HOSPITAL_COMMUNITY): Payer: Self-pay

## 2011-06-22 ENCOUNTER — Other Ambulatory Visit: Payer: Self-pay | Admitting: Orthopedic Surgery

## 2011-06-22 ENCOUNTER — Encounter (HOSPITAL_COMMUNITY)
Admission: RE | Admit: 2011-06-22 | Discharge: 2011-06-22 | Disposition: A | Discharge status: Home / Self Care | Payer: 59 | Source: Ambulatory Visit | Attending: Orthopedic Surgery | Admitting: Orthopedic Surgery

## 2011-06-22 HISTORY — DX: Nausea with vomiting, unspecified: R11.2

## 2011-06-22 HISTORY — DX: Other specified postprocedural states: Z98.890

## 2011-06-22 LAB — TYPE AND SCREEN
ABO/RH(D): A NEG
Antibody Screen: NEGATIVE
Unit division: 0

## 2011-06-22 LAB — URINALYSIS, ROUTINE W REFLEX MICROSCOPIC
Bilirubin Urine: NEGATIVE
Glucose, UA: NEGATIVE mg/dL
Hgb urine dipstick: NEGATIVE
Ketones, ur: NEGATIVE mg/dL
Leukocytes, UA: NEGATIVE
Nitrite: NEGATIVE
Protein, ur: NEGATIVE mg/dL
Specific Gravity, Urine: 1.017 (ref 1.005–1.030)
Urobilinogen, UA: 0.2 mg/dL (ref 0.0–1.0)
pH: 7.5 (ref 5.0–8.0)

## 2011-06-22 LAB — CBC
HCT: 37.6 % (ref 36.0–46.0)
Hemoglobin: 12.5 g/dL (ref 12.0–15.0)
MCH: 27.7 pg (ref 26.0–34.0)
MCHC: 33.2 g/dL (ref 30.0–36.0)
MCV: 83.2 fL (ref 78.0–100.0)
Platelets: 222 10*3/uL (ref 150–400)
RBC: 4.52 MIL/uL (ref 3.87–5.11)
RDW: 14 % (ref 11.5–15.5)
WBC: 6.6 10*3/uL (ref 4.0–10.5)

## 2011-06-22 LAB — BASIC METABOLIC PANEL
BUN: 19 mg/dL (ref 6–23)
CO2: 23 mEq/L (ref 19–32)
Calcium: 9.5 mg/dL (ref 8.4–10.5)
Chloride: 107 mEq/L (ref 96–112)
Creatinine, Ser: 0.82 mg/dL (ref 0.50–1.10)
GFR calc Af Amer: 85 mL/min — ABNORMAL LOW (ref >90)
GFR calc non Af Amer: 73 mL/min — ABNORMAL LOW (ref >90)
Glucose, Bld: 123 mg/dL — ABNORMAL HIGH (ref 70–99)
Potassium: 4.3 mEq/L (ref 3.5–5.1)
Sodium: 140 mEq/L (ref 135–145)

## 2011-06-22 LAB — DIFFERENTIAL
Basophils Absolute: 0 10*3/uL (ref 0.0–0.1)
Basophils Relative: 0 % (ref 0–1)
Eosinophils Absolute: 0.2 10*3/uL (ref 0.0–0.7)
Eosinophils Relative: 3 % (ref 0–5)
Lymphocytes Relative: 28 % (ref 12–46)
Lymphs Abs: 1.8 10*3/uL (ref 0.7–4.0)
Monocytes Absolute: 0.5 10*3/uL (ref 0.1–1.0)
Monocytes Relative: 8 % (ref 3–12)
Neutro Abs: 4 10*3/uL (ref 1.7–7.7)
Neutrophils Relative %: 61 % (ref 43–77)

## 2011-06-22 LAB — PROTIME-INR
INR: 1.06 (ref 0.00–1.49)
Prothrombin Time: 14 seconds (ref 11.6–15.2)

## 2011-06-22 LAB — SURGICAL PCR SCREEN
MRSA, PCR: NEGATIVE
Staphylococcus aureus: NEGATIVE

## 2011-06-22 LAB — ABO/RH: ABO/RH(D): A NEG

## 2011-06-22 LAB — APTT: aPTT: 35 seconds (ref 24–37)

## 2011-06-22 NOTE — Pre-Procedure Instructions (Signed)
[name]  06/22/2011   Your procedure is scheduled on:  June 24, 2011  Report to [facility] at 1130 AM.  Call this number if you have problems the morning of surgery: [phone]   Remember:   Do not eat food:After Midnight.  May have clear liquids: up to 4 Hours before arrival.  Clear liquids include soda, tea, black coffee, apple or grape juice, broth.  Take these medicines the morning of surgery with A SIP OF WATER: tylenol, eye drops   STOP Mobic today  Do not wear jewelry, make-up or nail polish.  Do not wear lotions, powders, or perfumes. You may wear deodorant.  Do not shave 48 hours prior to surgery.  Do not bring valuables to the hospital.  Contacts, dentures or bridgework may not be worn into surgery.  Leave suitcase in the car. After surgery it may be brought to your room.  For patients admitted to the hospital, checkout time is 11:00 AM the day of discharge.   Patients discharged the day of surgery will not be allowed to drive home.  Special Instructions: CHG Shower Use Special Wash: 1/2 bottle night before surgery and 1/2 bottle morning of surgery.   Please read over the following fact sheets that you were given: Pain Booklet, Coughing and Deep Breathing, Blood Transfusion Information, Total Joint Packet and Surgical Site Infection Prevention

## 2011-06-23 MED ORDER — CEFAZOLIN SODIUM-DEXTROSE 2-3 GM-% IV SOLR
2.0000 g | Freq: Once | INTRAVENOUS | Status: AC
  Administered 2011-06-24: 2 g via INTRAVENOUS
  Filled 2011-06-23: qty 50

## 2011-06-23 MED ORDER — DEXTROSE-NACL 5-0.45 % IV SOLN: INTRAVENOUS | Status: DC

## 2011-06-23 NOTE — H&P (Signed)
  HISTORY OF PRESENT ILLNESS:  [name] is a 66 year old patient who complains of pain in her right hip that has been progressively worsening.  She had an intra-articular steroid injection by Dr. Regino Schultze that provided her with good temporary pain relief.    At this point, the injection has worn off, and her pain is waking her from sleep at night.  She has also fallen twice because of her hip pain.  PAST MEDICAL HISTORY:  Significant for high cholesterol, hypertension, diabetes.  CURRENT MEDICATIONS:  Benicar, Actos, and Pravastatin.  DRUG ALLERGIES:  No known drug allergies.  PAST SURGICAL HISTORY:  Significant for hysterectomy, right knee scope, and thumb surgery.  FAMILY HISTORY:  Positive for diabetes, hypertension, heart disease.  SOCIAL HISTORY:  She denies the use of alcohol or tobacco.  She is married.  Review of systems reviewed thoroughly and is positive for glasses, cataracts, TIA.  Otherwise negative aside from musculoskeletal complaints.  OBJECTIVE:   Extraocular motion is intact.  No use of accessory respiratory muscles for breathing.   Cardiovascular exam reveals a regular rhythm.   She walks with a profound right-sided Trendelenburg gait.   Any attempts at internal rotation of the right hip reproduces severe pain.  She is otherwise a well-nourished, well-developed 63- year-old woman, 5-feet-4-inches tall, 234 pounds, in mild distress from the pain in her hip.  No cuts, scrapes or abrasions on the skin.  She is neurovascularly intact.  Normal pulses to the feet.  Normal sensation of the feet.  RADIOGRAPHS:  Other reviewed images, recent x-rays from June of 2012 do show bone-on-bone arthritic changes on the AP and cross-table lateral views.  IMPRESSION:  End-stage arthritis, right hip, having failed conservative measures over the last year.  PLAN:  We will get her set up for total hip arthroplasty.  Risks and benefits of surgery discussed at length.  I will see her back at the time  of surgical intervention.

## 2011-06-23 NOTE — Progress Notes (Signed)
CALLED PT AT HOME .... GIVEN NEW ARRIVAL TIME OF 0530 FOR TOMORROW AM 06/24/11 

## 2011-06-24 ENCOUNTER — Ambulatory Visit (HOSPITAL_COMMUNITY): Payer: 59

## 2011-06-24 ENCOUNTER — Encounter (HOSPITAL_COMMUNITY): Payer: Self-pay | Admitting: *Deleted

## 2011-06-24 ENCOUNTER — Encounter (HOSPITAL_COMMUNITY)
Admission: RE | Disposition: A | Discharge status: Home Health | Payer: Self-pay | Source: Ambulatory Visit | Attending: Orthopedic Surgery

## 2011-06-24 ENCOUNTER — Encounter (HOSPITAL_COMMUNITY): Payer: Self-pay | Admitting: Anesthesiology

## 2011-06-24 ENCOUNTER — Inpatient Hospital Stay (HOSPITAL_COMMUNITY)
Admission: RE | Admit: 2011-06-24 | Discharge: 2011-06-27 | DRG: 470 | Disposition: A | Discharge status: Home Health | Payer: 59 | Source: Ambulatory Visit | Attending: Orthopedic Surgery | Admitting: Orthopedic Surgery

## 2011-06-24 ENCOUNTER — Ambulatory Visit (HOSPITAL_COMMUNITY): Payer: 59 | Admitting: Anesthesiology

## 2011-06-24 DIAGNOSIS — Z01812 Encounter for preprocedural laboratory examination: Secondary | ICD-10-CM | POA: Diagnosis not present

## 2011-06-24 DIAGNOSIS — Z7901 Long term (current) use of anticoagulants: Secondary | ICD-10-CM

## 2011-06-24 DIAGNOSIS — Z01810 Encounter for preprocedural cardiovascular examination: Secondary | ICD-10-CM | POA: Diagnosis not present

## 2011-06-24 DIAGNOSIS — F411 Generalized anxiety disorder: Secondary | ICD-10-CM | POA: Diagnosis present

## 2011-06-24 DIAGNOSIS — K219 Gastro-esophageal reflux disease without esophagitis: Secondary | ICD-10-CM | POA: Diagnosis present

## 2011-06-24 DIAGNOSIS — I10 Essential (primary) hypertension: Secondary | ICD-10-CM | POA: Diagnosis present

## 2011-06-24 DIAGNOSIS — H409 Unspecified glaucoma: Secondary | ICD-10-CM | POA: Diagnosis present

## 2011-06-24 DIAGNOSIS — Z79899 Other long term (current) drug therapy: Secondary | ICD-10-CM

## 2011-06-24 DIAGNOSIS — M169 Osteoarthritis of hip, unspecified: Secondary | ICD-10-CM | POA: Diagnosis present

## 2011-06-24 DIAGNOSIS — D62 Acute posthemorrhagic anemia: Secondary | ICD-10-CM | POA: Diagnosis not present

## 2011-06-24 DIAGNOSIS — E785 Hyperlipidemia, unspecified: Secondary | ICD-10-CM | POA: Diagnosis present

## 2011-06-24 DIAGNOSIS — Z8673 Personal history of transient ischemic attack (TIA), and cerebral infarction without residual deficits: Secondary | ICD-10-CM | POA: Diagnosis not present

## 2011-06-24 DIAGNOSIS — E119 Type 2 diabetes mellitus without complications: Secondary | ICD-10-CM | POA: Diagnosis present

## 2011-06-24 DIAGNOSIS — M25559 Pain in unspecified hip: Secondary | ICD-10-CM | POA: Diagnosis not present

## 2011-06-24 DIAGNOSIS — M1610 Unilateral primary osteoarthritis, unspecified hip: Principal | ICD-10-CM | POA: Diagnosis present

## 2011-06-24 HISTORY — PX: TOTAL HIP ARTHROPLASTY: SHX124

## 2011-06-24 LAB — HEMOGLOBIN A1C
Hgb A1c MFr Bld: 6.9 % — ABNORMAL HIGH (ref <5.7)
Mean Plasma Glucose: 151 mg/dL — ABNORMAL HIGH (ref <117)

## 2011-06-24 LAB — GLUCOSE, CAPILLARY
Glucose-Capillary: 164 mg/dL — ABNORMAL HIGH (ref 70–99)
Glucose-Capillary: 136 mg/dL — ABNORMAL HIGH (ref 70–99)
Glucose-Capillary: 163 mg/dL — ABNORMAL HIGH (ref 70–99)
Glucose-Capillary: 185 mg/dL — ABNORMAL HIGH (ref 70–99)

## 2011-06-24 SURGERY — ARTHROPLASTY, HIP, TOTAL,POSTERIOR APPROACH
Anesthesia: General | Site: Hip | Laterality: Right | Wound class: Clean

## 2011-06-24 MED ORDER — METHOCARBAMOL 100 MG/ML IJ SOLN
500.0000 mg | INTRAVENOUS | Status: AC
  Administered 2011-06-24: 500 mg via INTRAVENOUS
  Filled 2011-06-24: qty 5

## 2011-06-24 MED ORDER — BISACODYL 5 MG PO TBEC: 5.0000 mg | DELAYED_RELEASE_TABLET | Freq: Every day | ORAL | Status: DC | PRN

## 2011-06-24 MED ORDER — BRIMONIDINE TARTRATE-TIMOLOL 0.2-0.5 % OP SOLN: 1.0000 [drp] | Freq: Two times a day (BID) | OPHTHALMIC | Status: DC

## 2011-06-24 MED ORDER — CHLORHEXIDINE GLUCONATE 4 % EX LIQD: 60.0000 mL | Freq: Once | CUTANEOUS | Status: DC

## 2011-06-24 MED ORDER — LIDOCAINE HCL (CARDIAC) 10 MG/ML IV SOLN
INTRAVENOUS | Status: DC | PRN
  Administered 2011-06-24: 40 mg via INTRAVENOUS

## 2011-06-24 MED ORDER — ACETAMINOPHEN 650 MG RE SUPP: 650.0000 mg | Freq: Four times a day (QID) | RECTAL | Status: DC | PRN

## 2011-06-24 MED ORDER — MELOXICAM 15 MG PO TABS
15.0000 mg | ORAL_TABLET | Freq: Every day | ORAL | Status: DC
  Administered 2011-06-25 – 2011-06-27 (×3): 15 mg via ORAL
  Filled 2011-06-24 (×4): qty 1

## 2011-06-24 MED ORDER — SODIUM CHLORIDE 0.9 % IR SOLN
Status: DC | PRN
  Administered 2011-06-24: 1000 mL

## 2011-06-24 MED ORDER — SENNOSIDES-DOCUSATE SODIUM 8.6-50 MG PO TABS
1.0000 | ORAL_TABLET | Freq: Every evening | ORAL | Status: DC | PRN
  Filled 2011-06-24: qty 1

## 2011-06-24 MED ORDER — BRIMONIDINE TARTRATE-TIMOLOL 0.2-0.5 % OP SOLN
1.0000 [drp] | Freq: Two times a day (BID) | OPHTHALMIC | Status: DC
  Administered 2011-06-25 – 2011-06-26 (×2): 1 [drp] via OPHTHALMIC

## 2011-06-24 MED ORDER — ONDANSETRON HCL 4 MG PO TABS
4.0000 mg | ORAL_TABLET | Freq: Four times a day (QID) | ORAL | Status: DC | PRN
  Administered 2011-06-25 – 2011-06-26 (×2): 4 mg via ORAL
  Filled 2011-06-24 (×2): qty 1

## 2011-06-24 MED ORDER — PHENOL 1.4 % MT LIQD
1.0000 | OROMUCOSAL | Status: DC | PRN
  Filled 2011-06-24: qty 177

## 2011-06-24 MED ORDER — DROPERIDOL 2.5 MG/ML IJ SOLN
INTRAMUSCULAR | Status: DC | PRN
  Administered 2011-06-24: 0.625 mg via INTRAVENOUS

## 2011-06-24 MED ORDER — DIPHENHYDRAMINE HCL 12.5 MG/5ML PO ELIX
12.5000 mg | ORAL_SOLUTION | ORAL | Status: DC | PRN
  Filled 2011-06-24: qty 10

## 2011-06-24 MED ORDER — METHOCARBAMOL 500 MG PO TABS
500.0000 mg | ORAL_TABLET | Freq: Four times a day (QID) | ORAL | Status: DC | PRN
  Administered 2011-06-25 – 2011-06-27 (×6): 500 mg via ORAL
  Filled 2011-06-24 (×6): qty 1

## 2011-06-24 MED ORDER — METFORMIN HCL 500 MG PO TABS
500.0000 mg | ORAL_TABLET | Freq: Every day | ORAL | Status: DC
Start: 2011-06-25 — End: 2011-06-27
  Administered 2011-06-25 – 2011-06-27 (×3): 500 mg via ORAL
  Filled 2011-06-24 (×4): qty 1

## 2011-06-24 MED ORDER — BIMATOPROST 0.03 % OP SOLN
1.0000 [drp] | Freq: Every day | OPHTHALMIC | Status: DC
  Administered 2011-06-25 – 2011-06-26 (×2): 1 [drp] via OPHTHALMIC
  Filled 2011-06-24: qty 2.5

## 2011-06-24 MED ORDER — BRIMONIDINE TARTRATE 0.2 % OP SOLN
1.0000 [drp] | Freq: Two times a day (BID) | OPHTHALMIC | Status: DC
  Filled 2011-06-24: qty 5

## 2011-06-24 MED ORDER — HYDROMORPHONE HCL PF 1 MG/ML IJ SOLN
INTRAMUSCULAR | Status: AC
  Filled 2011-06-24: qty 1

## 2011-06-24 MED ORDER — HYDROMORPHONE HCL PF 1 MG/ML IJ SOLN
0.2500 mg | INTRAMUSCULAR | Status: DC | PRN
  Administered 2011-06-24 (×4): 0.5 mg via INTRAVENOUS

## 2011-06-24 MED ORDER — PREDNISOLONE ACETATE 1 % OP SUSP
1.0000 [drp] | Freq: Four times a day (QID) | OPHTHALMIC | Status: DC
  Administered 2011-06-25 – 2011-06-27 (×9): 1 [drp] via OPHTHALMIC
  Filled 2011-06-24: qty 1

## 2011-06-24 MED ORDER — MENTHOL 3 MG MT LOZG
1.0000 | LOZENGE | OROMUCOSAL | Status: DC | PRN
  Filled 2011-06-24: qty 9

## 2011-06-24 MED ORDER — PROPOFOL 10 MG/ML IV EMUL
INTRAVENOUS | Status: DC | PRN
  Administered 2011-06-24: 150 mg via INTRAVENOUS

## 2011-06-24 MED ORDER — WARFARIN VIDEO: Freq: Once | Status: DC

## 2011-06-24 MED ORDER — NEPAFENAC 0.1 % OP SUSP
1.0000 [drp] | Freq: Three times a day (TID) | OPHTHALMIC | Status: DC
  Administered 2011-06-24 – 2011-06-27 (×8): 1 [drp] via OPHTHALMIC
  Filled 2011-06-24: qty 3

## 2011-06-24 MED ORDER — METOCLOPRAMIDE HCL 5 MG/ML IJ SOLN
5.0000 mg | Freq: Three times a day (TID) | INTRAMUSCULAR | Status: DC | PRN
  Filled 2011-06-24: qty 2

## 2011-06-24 MED ORDER — ZOLPIDEM TARTRATE 5 MG PO TABS: 5.0000 mg | ORAL_TABLET | Freq: Every evening | ORAL | Status: DC | PRN

## 2011-06-24 MED ORDER — SUCCINYLCHOLINE CHLORIDE 20 MG/ML IJ SOLN
INTRAMUSCULAR | Status: DC | PRN
  Administered 2011-06-24: 100 mg via INTRAVENOUS

## 2011-06-24 MED ORDER — ACETAZOLAMIDE ER 500 MG PO CP12
500.0000 mg | ORAL_CAPSULE | Freq: Every day | ORAL | Status: DC
  Administered 2011-06-25 – 2011-06-26 (×2): 500 mg via ORAL
  Filled 2011-06-24 (×4): qty 1

## 2011-06-24 MED ORDER — OXYCODONE HCL 5 MG PO TABS
5.0000 mg | ORAL_TABLET | ORAL | Status: DC | PRN
  Administered 2011-06-25 – 2011-06-26 (×5): 10 mg via ORAL
  Filled 2011-06-24 (×5): qty 2

## 2011-06-24 MED ORDER — PROPOFOL 10 MG/ML IV EMUL
INTRAVENOUS | Status: DC | PRN
  Administered 2011-06-24: 25 ug/kg/min via INTRAVENOUS

## 2011-06-24 MED ORDER — KCL IN DEXTROSE-NACL 20-5-0.45 MEQ/L-%-% IV SOLN
INTRAVENOUS | Status: DC
  Administered 2011-06-24 – 2011-06-25 (×2): via INTRAVENOUS
  Filled 2011-06-24 (×12): qty 1000

## 2011-06-24 MED ORDER — METOCLOPRAMIDE HCL 10 MG PO TABS
5.0000 mg | ORAL_TABLET | Freq: Three times a day (TID) | ORAL | Status: DC | PRN
  Administered 2011-06-26: 10 mg via ORAL
  Filled 2011-06-24: qty 2

## 2011-06-24 MED ORDER — BIMATOPROST 0.01 % OP SOLN
1.0000 [drp] | Freq: Every day | OPHTHALMIC | Status: DC
  Filled 2011-06-24: qty 5

## 2011-06-24 MED ORDER — LACTATED RINGERS IV SOLN
INTRAVENOUS | Status: DC | PRN
  Administered 2011-06-24 (×3): via INTRAVENOUS

## 2011-06-24 MED ORDER — DEXAMETHASONE SODIUM PHOSPHATE 4 MG/ML IJ SOLN
INTRAMUSCULAR | Status: DC | PRN
  Administered 2011-06-24: 4 mg via INTRAVENOUS

## 2011-06-24 MED ORDER — ENOXAPARIN SODIUM 40 MG/0.4ML ~~LOC~~ SOLN
40.0000 mg | SUBCUTANEOUS | Status: DC
  Administered 2011-06-25 – 2011-06-27 (×3): 40 mg via SUBCUTANEOUS
  Filled 2011-06-24 (×4): qty 0.4

## 2011-06-24 MED ORDER — NEOSTIGMINE METHYLSULFATE 1 MG/ML IJ SOLN
INTRAMUSCULAR | Status: DC | PRN
  Administered 2011-06-24: 4 mg via INTRAVENOUS

## 2011-06-24 MED ORDER — TIMOLOL MALEATE 0.5 % OP SOLN
1.0000 [drp] | Freq: Two times a day (BID) | OPHTHALMIC | Status: DC
  Filled 2011-06-24: qty 5

## 2011-06-24 MED ORDER — METHOCARBAMOL 100 MG/ML IJ SOLN
500.0000 mg | Freq: Four times a day (QID) | INTRAVENOUS | Status: DC | PRN
  Filled 2011-06-24: qty 5

## 2011-06-24 MED ORDER — WARFARIN SODIUM 5 MG PO TABS
5.0000 mg | ORAL_TABLET | Freq: Once | ORAL | Status: AC
  Administered 2011-06-24: 5 mg via ORAL
  Filled 2011-06-24: qty 1

## 2011-06-24 MED ORDER — ONDANSETRON HCL 4 MG/2ML IJ SOLN
4.0000 mg | Freq: Four times a day (QID) | INTRAMUSCULAR | Status: DC | PRN
  Administered 2011-06-24: 4 mg via INTRAVENOUS
  Filled 2011-06-24: qty 2

## 2011-06-24 MED ORDER — MEPERIDINE HCL 25 MG/ML IJ SOLN: 6.2500 mg | INTRAMUSCULAR | Status: DC | PRN

## 2011-06-24 MED ORDER — ALUMINUM HYDROXIDE GEL 320 MG/5ML PO SUSP
15.0000 mL | ORAL | Status: DC | PRN
  Filled 2011-06-24: qty 30

## 2011-06-24 MED ORDER — ONDANSETRON HCL 4 MG/2ML IJ SOLN
INTRAMUSCULAR | Status: DC | PRN
  Administered 2011-06-24 (×2): 4 mg via INTRAVENOUS

## 2011-06-24 MED ORDER — HETASTARCH-ELECTROLYTES 6 % IV SOLN
INTRAVENOUS | Status: DC | PRN
  Administered 2011-06-24: 08:00:00 via INTRAVENOUS

## 2011-06-24 MED ORDER — INSULIN ASPART 100 UNIT/ML ~~LOC~~ SOLN
0.0000 [IU] | Freq: Three times a day (TID) | SUBCUTANEOUS | Status: DC
  Administered 2011-06-27 (×2): 2 [IU] via SUBCUTANEOUS
  Filled 2011-06-24: qty 3

## 2011-06-24 MED ORDER — MIDAZOLAM HCL 5 MG/5ML IJ SOLN
INTRAMUSCULAR | Status: DC | PRN
  Administered 2011-06-24: 2 mg via INTRAVENOUS

## 2011-06-24 MED ORDER — PROMETHAZINE HCL 25 MG/ML IJ SOLN: 6.2500 mg | INTRAMUSCULAR | Status: DC | PRN

## 2011-06-24 MED ORDER — COUMADIN BOOK
Freq: Once | Status: AC
  Administered 2011-06-24: 17:00:00
  Filled 2011-06-24: qty 1

## 2011-06-24 MED ORDER — BUPIVACAINE-EPINEPHRINE 0.5% -1:200000 IJ SOLN
INTRAMUSCULAR | Status: DC | PRN
  Administered 2011-06-24: 20 mL

## 2011-06-24 MED ORDER — HYDROCODONE-ACETAMINOPHEN 5-325 MG PO TABS
1.0000 | ORAL_TABLET | ORAL | Status: DC | PRN
  Administered 2011-06-26 – 2011-06-27 (×7): 2 via ORAL
  Filled 2011-06-24 (×7): qty 2

## 2011-06-24 MED ORDER — HYDROMORPHONE HCL PF 1 MG/ML IJ SOLN
0.5000 mg | INTRAMUSCULAR | Status: DC | PRN
Start: 2011-06-24 — End: 2011-06-27
  Administered 2011-06-24 – 2011-06-25 (×3): 1 mg via INTRAVENOUS
  Filled 2011-06-24 (×3): qty 1

## 2011-06-24 MED ORDER — FLEET ENEMA 7-19 GM/118ML RE ENEM: 1.0000 | ENEMA | Freq: Once | RECTAL | Status: AC | PRN

## 2011-06-24 MED ORDER — GLYCOPYRROLATE 0.2 MG/ML IJ SOLN
INTRAMUSCULAR | Status: DC | PRN
  Administered 2011-06-24: .6 mg via INTRAVENOUS

## 2011-06-24 MED ORDER — FENTANYL CITRATE 0.05 MG/ML IJ SOLN
INTRAMUSCULAR | Status: DC | PRN
  Administered 2011-06-24: 50 ug via INTRAVENOUS
  Administered 2011-06-24: 100 ug via INTRAVENOUS
  Administered 2011-06-24: 150 ug via INTRAVENOUS
  Administered 2011-06-24 (×2): 50 ug via INTRAVENOUS
  Administered 2011-06-24: 100 ug via INTRAVENOUS

## 2011-06-24 MED ORDER — ACETAMINOPHEN 325 MG PO TABS
650.0000 mg | ORAL_TABLET | Freq: Four times a day (QID) | ORAL | Status: DC | PRN
  Administered 2011-06-25: 650 mg via ORAL

## 2011-06-24 MED ORDER — ROCURONIUM BROMIDE 100 MG/10ML IV SOLN
INTRAVENOUS | Status: DC | PRN
  Administered 2011-06-24: 50 mg via INTRAVENOUS

## 2011-06-24 SURGICAL SUPPLY — 54 items
BLADE SAW SAG 73X25 THK (BLADE) ×1
BLADE SAW SGTL 18X1.27X75 (BLADE) IMPLANT
BLADE SAW SGTL 73X25 THK (BLADE) ×1 IMPLANT
BLADE SAW SGTL MED 73X18.5 STR (BLADE) IMPLANT
BRUSH FEMORAL CANAL (MISCELLANEOUS) IMPLANT
CLOTH BEACON ORANGE TIMEOUT ST (SAFETY) ×2 IMPLANT
COVER BACK TABLE 24X17X13 BIG (DRAPES) IMPLANT
COVER SURGICAL LIGHT HANDLE (MISCELLANEOUS) ×4 IMPLANT
DRAPE ORTHO SPLIT 77X108 STRL (DRAPES) ×2
DRAPE PROXIMA HALF (DRAPES) ×2 IMPLANT
DRAPE SURG ORHT 6 SPLT 77X108 (DRAPES) ×1 IMPLANT
DRAPE U-SHAPE 47X51 STRL (DRAPES) ×2 IMPLANT
DRILL BIT 7/64X5 (BIT) ×2 IMPLANT
DRSG MEPILEX BORDER 4X12 (GAUZE/BANDAGES/DRESSINGS) ×2 IMPLANT
DRSG MEPILEX BORDER 4X8 (GAUZE/BANDAGES/DRESSINGS) ×2 IMPLANT
DURAPREP 26ML APPLICATOR (WOUND CARE) ×2 IMPLANT
ELECT BLADE 4.0 EZ CLEAN MEGAD (MISCELLANEOUS) ×2
ELECT REM PT RETURN 9FT ADLT (ELECTROSURGICAL) ×2
ELECTRODE BLDE 4.0 EZ CLN MEGD (MISCELLANEOUS) IMPLANT
ELECTRODE REM PT RTRN 9FT ADLT (ELECTROSURGICAL) ×1 IMPLANT
FLOSEAL 10ML (HEMOSTASIS) IMPLANT
GAUZE XEROFORM 1X8 LF (GAUZE/BANDAGES/DRESSINGS) ×2 IMPLANT
GLOVE BIO SURGEON STRL SZ7 (GLOVE) ×2 IMPLANT
GLOVE BIO SURGEON STRL SZ7.5 (GLOVE) ×2 IMPLANT
GLOVE BIOGEL PI IND STRL 7.0 (GLOVE) ×1 IMPLANT
GLOVE BIOGEL PI IND STRL 8 (GLOVE) ×1 IMPLANT
GLOVE BIOGEL PI INDICATOR 7.0 (GLOVE) ×1
GLOVE BIOGEL PI INDICATOR 8 (GLOVE) ×1
GOWN PREVENTION PLUS XLARGE (GOWN DISPOSABLE) ×2 IMPLANT
GOWN STRL NON-REIN LRG LVL3 (GOWN DISPOSABLE) ×4 IMPLANT
HANDPIECE INTERPULSE COAX TIP (DISPOSABLE)
HOOD PEEL AWAY FACE SHEILD DIS (HOOD) ×4 IMPLANT
KIT BASIN OR (CUSTOM PROCEDURE TRAY) ×2 IMPLANT
KIT ROOM TURNOVER OR (KITS) ×2 IMPLANT
MANIFOLD NEPTUNE II (INSTRUMENTS) ×2 IMPLANT
NEEDLE 22X1 1/2 (OR ONLY) (NEEDLE) ×2 IMPLANT
NS IRRIG 1000ML POUR BTL (IV SOLUTION) ×2 IMPLANT
PACK TOTAL JOINT (CUSTOM PROCEDURE TRAY) ×2 IMPLANT
PAD ARMBOARD 7.5X6 YLW CONV (MISCELLANEOUS) ×4 IMPLANT
PASSER SUT SWANSON 36MM LOOP (INSTRUMENTS) ×2 IMPLANT
PRESSURIZER FEMORAL UNIV (MISCELLANEOUS) IMPLANT
SET HNDPC FAN SPRY TIP SCT (DISPOSABLE) IMPLANT
SUT ETHIBOND 2 V 37 (SUTURE) ×2 IMPLANT
SUT ETHILON 3 0 FSL (SUTURE) ×2 IMPLANT
SUT VIC AB 0 CTB1 27 (SUTURE) ×2 IMPLANT
SUT VIC AB 1 CTX 36 (SUTURE) ×2
SUT VIC AB 1 CTX36XBRD ANBCTR (SUTURE) ×1 IMPLANT
SUT VIC AB 2-0 CTB1 (SUTURE) ×2 IMPLANT
SYR CONTROL 10ML LL (SYRINGE) ×2 IMPLANT
TOWEL OR 17X24 6PK STRL BLUE (TOWEL DISPOSABLE) ×2 IMPLANT
TOWEL OR 17X26 10 PK STRL BLUE (TOWEL DISPOSABLE) ×2 IMPLANT
TOWER CARTRIDGE SMART MIX (DISPOSABLE) IMPLANT
TRAY FOLEY CATH 14FR (SET/KITS/TRAYS/PACK) ×1 IMPLANT
WATER STERILE IRR 1000ML POUR (IV SOLUTION) ×8 IMPLANT

## 2011-06-24 NOTE — Transfer of Care (Signed)
Immediate Anesthesia Transfer of Care Note  Patient: [name]  Procedure(s) Performed:  TOTAL HIP ARTHROPLASTY  Patient Location: PACU  Anesthesia Type: General  Level of Consciousness: awake, alert , oriented and patient cooperative  Airway & Oxygen Therapy: Patient Spontanous Breathing and Patient connected to nasal cannula oxygen  Post-op Assessment: Report given to PACU RN and Post -op Vital signs reviewed and stable  Post vital signs: Reviewed and stable Filed Vitals:   06/24/11 0945  BP:   Pulse:   Temp: 36.4 C  Resp:     Complications: No apparent anesthesia complications

## 2011-06-24 NOTE — Preoperative (Signed)
Beta Blockers   Reason not to administer Beta Blockers:Not Applicable 

## 2011-06-24 NOTE — H&P (View-Only) (Signed)
CALLED PT AT HOME .... GIVEN NEW ARRIVAL TIME OF 0530 FOR TOMORROW AM 06/24/11

## 2011-06-24 NOTE — Anesthesia Postprocedure Evaluation (Signed)
  Anesthesia Post-op Note  Patient: [name]  Procedure(s) Performed:  TOTAL HIP ARTHROPLASTY  Patient Location: PACU  Anesthesia Type: General  Level of Consciousness: awake and alert   Airway and Oxygen Therapy: Patient Spontanous Breathing and Patient connected to nasal cannula oxygen  Post-op Pain: mild  Post-op Assessment: Post-op Vital signs reviewed, Patient's Cardiovascular Status Stable, Respiratory Function Stable and Patent Airway  Post-op Vital Signs: Reviewed and stable  Complications: No apparent anesthesia complications

## 2011-06-24 NOTE — Anesthesia Preprocedure Evaluation (Addendum)
Anesthesia Evaluation  Patient identified by MRN, date of birth, ID band Patient awake    Reviewed: Allergy & Precautions, H&P , NPO status , Patient's Chart, lab work & pertinent test results  History of Anesthesia Complications (+) PONV  Airway Mallampati: II TM Distance: >3 FB Neck ROM: Full    Dental No notable dental hx. (+) Teeth Intact and Dental Advisory Given   Pulmonary sleep apnea (no longer needs CPaP, lost weight) ,  clear to auscultation  Pulmonary exam normal       Cardiovascular Exercise Tolerance: Good hypertension, Pt. on medications neg cardio ROS Regular Normal    Neuro/Psych PSYCHIATRIC DISORDERS Anxiety TIA (no residuals)Negative Neurological ROS  Negative Psych ROS   GI/Hepatic negative GI ROS, Neg liver ROS, GERD- (resolved with weight loss)  Controlled,  Endo/Other  Negative Endocrine ROSDiabetes mellitus-, Well Controlled, Type 2, Oral Hypoglycemic Agents  Renal/GU negative Renal ROS  Genitourinary negative   Musculoskeletal  (+) Arthritis -, Osteoarthritis,    Abdominal   Peds  Hematology negative hematology ROS (+) H/o DVT RLE. Not on anticoagulant therapy.   Anesthesia Other Findings   Reproductive/Obstetrics negative OB ROS                          Anesthesia Physical Anesthesia Plan  ASA: III  Anesthesia Plan: General   Post-op Pain Management:    Induction: Intravenous  Airway Management Planned: Oral ETT  Additional Equipment:   Intra-op Plan:   Post-operative Plan:   Informed Consent: I have reviewed the patients History and Physical, chart, labs and discussed the procedure including the risks, benefits and alternatives for the proposed anesthesia with the patient or authorized representative who has indicated his/her understanding and acceptance.     Plan Discussed with: CRNA  Anesthesia Plan Comments:         Anesthesia Quick  Evaluation

## 2011-06-24 NOTE — Interval H&P Note (Signed)
History and Physical Interval Note:  06/24/2011 7:33 AM  [name]  has presented today for surgery, with the diagnosis of OSTEOARTHRITIS  R HIP  The various methods of treatment have been discussed with the patient and family. After consideration of risks, benefits and other options for treatment, the patient has consented to  Procedure(s): TOTAL HIP ARTHROPLASTY as a surgical intervention .  The patients' history has been reviewed, patient examined, no change in status, stable for surgery.  I have reviewed the patients' chart and labs.  Questions were answered to the patient's satisfaction.     Allison Deshotels J   

## 2011-06-24 NOTE — Plan of Care (Signed)
Problem: Consults Goal: Diagnosis- Total Joint Replacement Primary Total Hip     

## 2011-06-24 NOTE — Progress Notes (Signed)
ANTICOAGULATION CONSULT NOTE - Initial Consult  Pharmacy Consult for warfarin.   Indication: VTE prophylaxis s/p R THA.    Allergies  Allergen Reactions  . Lipitor (Atorvastatin Calcium)     Leg cramp  . Sitagliptin Phosphate Nausea And Vomiting  . Sulfa Drugs Cross Reactors Nausea And Vomiting    Vital Signs: Temp: 98.2 F (36.8 C) (01/11 1315) Temp src: Oral (01/11 1315) BP: 134/60 mmHg (01/11 1315) Pulse Rate: 79  (01/11 1315)  Labs:  Basename 06/22/11 1100  HGB 12.5  HCT 37.6  PLT 222  APTT 35  LABPROT 14.0  INR 1.06  HEPARINUNFRC --  CREATININE 0.82  CKTOTAL --  CKMB --  TROPONINI --   The CrCl is unknown because both a height and weight (above a minimum accepted value) are required for this calculation.  Medical History: Past Medical History  Diagnosis Date  . DIABETES MELLITUS, TYPE II 01/04/2007  . HYPERLIPIDEMIA 01/04/2007  . Overweight 01/04/2007  . ANXIETY 01/04/2007  . GLAUCOMA 07/30/2008  . OTITIS MEDIA, ACUTE, BILATERAL 02/29/2008  . OSTEOARTHRITIS, HIP 09/25/2009  . LEG PAIN, LEFT 07/06/2007  . Dizziness and giddiness 02/29/2008  . NUMBNESS 07/30/2008  . TRANSIENT ISCHEMIC ATTACK, HX OF 01/04/2007  . DVT, HX OF 07/06/2007  . PONV (postoperative nausea and vomiting)   . SLEEP APNEA, OBSTRUCTIVE 01/04/2007    history was on a CPAP but has resolved with weight loss  . HYPERTENSION 01/04/2007    does not see a cardiologist  . GERD 01/04/2007    pt reports resolved with weight loss    Medications:  Scheduled:    . acetaZOLAMIDE  500 mg Oral QHS  . bimatoprost  1 drop Right Eye QHS  . brimonidine  1 drop Right Eye Q12H  . ceFAZolin (ANCEF) IV  2 g Intravenous Once  . enoxaparin  40 mg Subcutaneous Q24H  . HYDROmorphone      . insulin aspart  0-15 Units Subcutaneous TID WC  . meloxicam  15 mg Oral Daily  . metFORMIN  500 mg Oral Q breakfast  . methocarbamol(ROBAXIN) IV  500 mg Intravenous To PACU  . nepafenac  1 drop Right Eye TID  . prednisoLONE  acetate  1 drop Right Eye QID  . timolol  1 drop Right Eye BID  . DISCONTD: brimonidine-timolol  1 drop Right Eye Q12H  . DISCONTD: chlorhexidine  60 mL Topical Once    Assessment: 66 yo female, s/p R THA, baseline INR 1.06 and 06/22/11 H/H 12.5/36.  Enoxaparin 40 mg subcutaneously q24 hours to begin tomorrow 06/25/11.         Goal of Therapy:  1.5-2   Plan:  1.  Coumadin 5 mg po x1 today 2.  Daily INR 3.  Coumadin book and video  Darreld Mclean 06/24/2011,1:52 PM

## 2011-06-24 NOTE — Op Note (Signed)
OPERATIVE REPORT    DATE OF PROCEDURE:  06/24/2011       PREOPERATIVE DIAGNOSIS:  OSTEOARTHRITIS  Right HIP                                                       Estimated Body mass index is 39.14 kg/(m^2) as calculated from the following:   Height as of 02/18/11: 5\' 4" (1.626 m).   Weight as of 02/18/11: 228 lb(103.42 kg).     POSTOPERATIVE DIAGNOSIS:  osteoarthritis right hip                                                           PROCEDURE:  R total hip arthroplasty using a 52 mm DePuy Pinnacle  Cup, [employer], 10-degree polyethylene liner index superior  and posterior, a +3 36 mm ceramic head, a #[id number] SROM stem, 16Dsm Cone   SURGEON: Janayia Burggraf J    ASSISTANT:   Mauricia Area, PA-C  (present throughout entire procedure and necessary for timely completion of the procedure)   ANESTHESIA:  General  BLOOD LOSS: * No blood loss amount entered *  FLUID REPLACEMENT: 1800 crystalloid DRAINS: Foley Catheter URINE OUTPUT: 300cc COMPLICATIONS:  None    INDICATIONS FOR PROCEDURE: A 66 y.o. year-old female  With  OSTEOARTHRITIS  Right HIP   for 3 years, x-rays show bone-on-bone arthritic changes. Despite conservative measures with observation, anti-inflammatory medicine, narcotics, use of a cane, has severe unremitting pain and can ambulate only a few blocks before resting.  Patient desires elective R total hip arthroplasty to decrease pain and increase function. The risks, benefits, and alternatives were discussed at length including but not limited to the risks of infection, bleeding, nerve injury, stiffness, blood clots, the need for revision surgery, cardiopulmonary complications, among others, and they were willing to proceed.y have been discussed. Questions answered.     PROCEDURE IN DETAIL: The patient was identified by armband,  received preoperative IV antibiotics in the holding area at [facility], taken to the operating room , appropriate anesthetic  monitors  were attached and general endotracheal anesthesia induced. Foley catheter was inserted. She was rolled into the L lateral decubitus position and fixed there with a Stulberg Mark II pelvic clamp and the R lower extremity was then prepped and draped  in the usual sterile fashion from the ankle to the hemipelvis. A time-out  procedure was performed. The skin along the lateral hip and thigh  infiltrated with 10 mL of 0.5% Marcaine and epinephrine solution. We  then made a posterolateral approach to the hip. With a #10 blade, 22 cm  incision through skin and subcutaneous tissue down to the level of the  IT band. Small bleeders were identified and cauterized. IT band cut in  line with skin incision exposing the greater trochanter. A Cobra retractor was placed between the gluteus minimus and the superior hip joint capsule, and a spiked Cobra between the quadratus femoris and the inferior hip joint capsule. This isolated the short  external rotators and piriformis tendons. These were tagged with a #2 Ethibond  suture and cut off their insertion on the  intertrochanteric crest. The posterior  capsule was then developed into an acetabular-based flap from Posterior Superior off of the acetabulum out over the femoral neck and back posterior inferior to the acetabular rim. This flap was tagged with two #2 Ethibond sutures and retracted protecting the sciatic nerve. This exposed the arthritic femoral head and osteophytes. The hip was then flexed and internally rotated, dislocating the femoral head and a standard neck cut performed 1 fingerbreadth above the lesser trochanter.  A spiked Cobra was placed in the cotyloid notch and a Hohmann retractor was then used to lever the femur anteriorly off of the anterior pelvic column. A posterior-inferior wing retractor was placed at the junction of the acetabulum and the ischium completing the acetabular exposure.We then removed the peripheral osteophytes and labrum  from the acetabulum. We then reamed the acetabulum up to 51 mm with basket reamers obtaining good coverage in all quadrants, irrigated out with normal  saline solution and hammered into place a 52 mm pinnacle cup in 45  degrees of abduction and about 20 degrees of anteversion. More  peripheral osteophytes removed and a trial 10-degree liner placed with the  index superior-posterior. The hip was then flexed and internally rotated exposing the  proximal femur, which was entered with the initiating reamer followed by  the axial reamers up to a 11.5 mm full depth and 12mm partial depth. We then conically reamed to 16D to the correct depth for a 42 base neck. The calcar was milled to 16Dsm. A trial cone and stem was inserted in the 25 degrees anteversion, with a +3 36mm trial head. Trial reduction was then performed and excellent stability was noted with at 90 of flexion with 70deg of internal rotation and then full extension with maximal external rotation. The hip could not be dislocated in full extension. The knee could easily flex  to about 130 degrees. We also stretched the abductors at this point,  because of the preexisting adductor contractures. All trial components  were then removed. The acetabulum was irrigated out with normal saline  solution. A titanium [facility] was then screwed into place  followed by a 10-degree polyethylene liner index superior-posterior. On  the femoral side a 16Dsm ZTT1 cone was hammered into place, followed by a [id number] SROM stem in 25 degrees of anteversion. At this point, a +3 36 mm ceramic head was  hammered on the stem. The hip was reduced. We checked our stability  one more time and found to be excellent. The wound was once again  thoroughly irrigated out with normal saline solution pulse lavage. The  capsular flap and short external rotators were repaired back to the  intertrochanteric crest through drill holes with a #2 Ethibond suture.  The IT  band was closed with running 1 Vicryl suture. The subcutaneous  tissue with 0 and 2-0 undyed Vicryl suture and the skin with running  interlocking 3-0 nylon suture. Dressing of Xeroform and Mepilex was  then applied. The patient was then unclamped, rolled supine, awaken extubated and taken to recovery room without difficulty in stable condition.   Evaan Tidwell J 06/24/2011, 9:46 AM

## 2011-06-24 NOTE — Interval H&P Note (Signed)
History and Physical Interval Note:  06/24/2011 7:33 AM  [name]  has presented today for surgery, with the diagnosis of OSTEOARTHRITIS  R HIP  The various methods of treatment have been discussed with the patient and family. After consideration of risks, benefits and other options for treatment, the patient has consented to  Procedure(s): TOTAL HIP ARTHROPLASTY as a surgical intervention .  The patients' history has been reviewed, patient examined, no change in status, stable for surgery.  I have reviewed the patients' chart and labs.  Questions were answered to the patient's satisfaction.     Nestor Lewandowsky

## 2011-06-24 NOTE — Progress Notes (Signed)
Dr. Sampson Goon aware about left pupils dilated and not reactive to light, have been like that since patient got to PACU. Pt. denies any trouble with it. Will have eye doctor come and check pt.

## 2011-06-24 NOTE — Progress Notes (Signed)
I spoke with the patients ophthamologist, Dr. Aldine Contes, who said that [name] has had a dilated left pupil for some time as per his office records. Since this is an old finding, and she is completely asymptomatic, with normal vision in that eye, I will d/c her from the PACU to the floor to continue her recovery from hip replacement surgery.

## 2011-06-24 NOTE — Anesthesia Procedure Notes (Signed)
Procedure Name: Intubation Date/Time: 06/24/2011 7:41 AM Performed by: Leona Singleton A. Patient Re-evaluated:Patient Re-evaluated prior to inductionOxygen Delivery Method: Circle System Utilized Preoxygenation: Pre-oxygenation with 100% oxygen Intubation Type: IV induction, Rapid sequence and Cricoid Pressure applied Laryngoscope Size: Miller and 2 Grade View: Grade I Tube type: Oral Tube size: 7.0 mm Number of attempts: 1 Airway Equipment and Method: stylet Placement Confirmation: ETT inserted through vocal cords under direct vision,  positive ETCO2 and breath sounds checked- equal and bilateral Secured at: 21 cm Tube secured with: Tape Dental Injury: Teeth and Oropharynx as per pre-operative assessment

## 2011-06-25 LAB — CBC
HCT: 24.9 % — ABNORMAL LOW (ref 36.0–46.0)
Hemoglobin: 8.3 g/dL — ABNORMAL LOW (ref 12.0–15.0)
MCH: 27.6 pg (ref 26.0–34.0)
MCHC: 33.3 g/dL (ref 30.0–36.0)
MCV: 82.7 fL (ref 78.0–100.0)
Platelets: 199 10*3/uL (ref 150–400)
RBC: 3.01 MIL/uL — ABNORMAL LOW (ref 3.87–5.11)
RDW: 14.3 % (ref 11.5–15.5)
WBC: 7.5 10*3/uL (ref 4.0–10.5)

## 2011-06-25 LAB — GLUCOSE, CAPILLARY
Glucose-Capillary: 178 mg/dL — ABNORMAL HIGH (ref 70–99)
Glucose-Capillary: 161 mg/dL — ABNORMAL HIGH (ref 70–99)
Glucose-Capillary: 153 mg/dL — ABNORMAL HIGH (ref 70–99)
Glucose-Capillary: 178 mg/dL — ABNORMAL HIGH (ref 70–99)

## 2011-06-25 LAB — BASIC METABOLIC PANEL
BUN: 12 mg/dL (ref 6–23)
CO2: 25 mEq/L (ref 19–32)
Calcium: 8.1 mg/dL — ABNORMAL LOW (ref 8.4–10.5)
Chloride: 104 mEq/L (ref 96–112)
Creatinine, Ser: 0.67 mg/dL (ref 0.50–1.10)
GFR calc Af Amer: >90 mL/min (ref >90)
GFR calc non Af Amer: >90 mL/min (ref >90)
Glucose, Bld: 174 mg/dL — ABNORMAL HIGH (ref 70–99)
Potassium: 3.7 mEq/L (ref 3.5–5.1)
Sodium: 135 mEq/L (ref 135–145)

## 2011-06-25 LAB — PROTIME-INR
INR: 1.25 (ref 0.00–1.49)
Prothrombin Time: 16 seconds — ABNORMAL HIGH (ref 11.6–15.2)

## 2011-06-25 MED ORDER — WARFARIN SODIUM 5 MG PO TABS
5.0000 mg | ORAL_TABLET | Freq: Once | ORAL | Status: AC
  Administered 2011-06-25: 5 mg via ORAL
  Filled 2011-06-25: qty 1

## 2011-06-25 NOTE — Progress Notes (Signed)
Physical Therapy Evaluation Patient Details Name: [name] MRN: [id number] DOB: [birth year] Today's Date: 06/25/2011  Problem List:  Patient Active Problem List  Diagnoses  . DIABETES MELLITUS, TYPE II  . HYPERLIPIDEMIA  . Overweight  . ANXIETY  . SLEEP APNEA, OBSTRUCTIVE  . GLAUCOMA  . HYPERTENSION  . ASTHMA  . GERD  . OSTEOARTHRITIS, HIP  . LEG PAIN, LEFT  . Dizziness and giddiness  . NUMBNESS  . TRANSIENT ISCHEMIC ATTACK, HX OF  . DVT, HX OF  . Preventative health care    Past Medical History:  Past Medical History  Diagnosis Date  . DIABETES MELLITUS, TYPE II 01/04/2007  . HYPERLIPIDEMIA 01/04/2007  . Overweight 01/04/2007  . ANXIETY 01/04/2007  . GLAUCOMA 07/30/2008  . OTITIS MEDIA, ACUTE, BILATERAL 02/29/2008  . OSTEOARTHRITIS, HIP 09/25/2009  . LEG PAIN, LEFT 07/06/2007  . Dizziness and giddiness 02/29/2008  . NUMBNESS 07/30/2008  . TRANSIENT ISCHEMIC ATTACK, HX OF 01/04/2007  . DVT, HX OF 07/06/2007  . PONV (postoperative nausea and vomiting)   . SLEEP APNEA, OBSTRUCTIVE 01/04/2007    history was on a CPAP but has resolved with weight loss  . HYPERTENSION 01/04/2007    does not see a cardiologist  . GERD 01/04/2007    pt reports resolved with weight loss   Past Surgical History:  Past Surgical History  Procedure Date  . Cholecystectomy   . Abdominal hysterectomy 1999  . Oophorectomy   . Eye surgery     shunt left and lazer eye surgery on right cataract and retenia tear with repair  . Knee arthroscopy   . Growth removal     from thumb    PT Assessment/Plan/Recommendation PT Assessment Clinical Impression Statement: Pt presents with a medical diagnosis of Right THA along with the following impairments/deficits and therapy diagnosis listed below. Pt will benefit from skilled PT in the acute care setting in order to maximize functional mobility for a safe d/c home PT Recommendation/Assessment: Patient will need skilled PT in the acute care venue PT Problem  List: Decreased strength;Decreased range of motion;Decreased activity tolerance;Decreased mobility;Decreased knowledge of use of DME;Decreased knowledge of precautions;Pain PT Therapy Diagnosis : Difficulty walking;Acute pain PT Plan PT Frequency: 7X/week PT Treatment/Interventions: DME instruction;Gait training;Stair training;Functional mobility training;Therapeutic activities;Therapeutic exercise;Patient/family education PT Recommendation Follow Up Recommendations: Home health PT;Supervision - Intermittent Equipment Recommended: None recommended by PT PT Goals  Acute Rehab PT Goals PT Goal Formulation: With patient Time For Goal Achievement: 7 days Pt will go Supine/Side to Sit: with modified independence PT Goal: Supine/Side to Sit - Progress: Progressing toward goal Pt will go Sit to Stand: with modified independence PT Goal: Sit to Stand - Progress: Progressing toward goal Pt will go Stand to Sit: with modified independence PT Goal: Stand to Sit - Progress: Progressing toward goal Pt will Transfer Bed to Chair/Chair to Bed: with supervision PT Transfer Goal: Bed to Chair/Chair to Bed - Progress: Progressing toward goal Pt will Ambulate: 51 - 150 feet;with supervision;with rolling walker PT Goal: Ambulate - Progress: Not met Pt will Go Up / Down Stairs: 1-2 stairs;with rail(s);with min assist PT Goal: Up/Down Stairs - Progress: Not met Pt will Perform Home Exercise Program: Independently PT Goal: Perform Home Exercise Program - Progress: Progressing toward goal Additional Goals Additional Goal #1: Pt will be able to recall and implement 3/3 hip precautions PT Goal: Additional Goal #1 - Progress: Progressing toward goal  PT Evaluation Precautions/Restrictions  Precautions Precautions: Posterior Hip Restrictions Weight Bearing Restrictions: Yes  RLE Weight Bearing: Weight bearing as tolerated Prior Functioning  Home Living Lives With: Spouse Receives Help From: Family Type  of Home: House Home Layout: One level Home Access: Stairs to enter Entrance Stairs-Rails: Right Entrance Stairs-Number of Steps: 2 Bathroom Shower/Tub: Walk-in shower;Door [employer] Toilet: Standard Bathroom Accessibility: Yes How Accessible: Accessible via walker Home Adaptive Equipment: Walker - rolling Prior Function Level of Independence: Independent with basic ADLs;Independent with homemaking with ambulation;Independent with transfers;Independent with gait (walked with a cane) Able to Take Stairs?: Yes Driving: No Vocation: On disability Cognition Cognition Arousal/Alertness: Awake/alert Overall Cognitive Status: Appears within functional limits for tasks assessed Orientation Level: Oriented X4 Sensation/Coordination Sensation Light Touch: Appears Intact Extremity Assessment RLE Assessment RLE Assessment: Exceptions to [facility] RLE AROM (degrees) Overall AROM Right Lower Extremity: Deficits;Due to precautions;Due to pain (Knee and Ankle WFL; Hip limited by pain) RLE Strength RLE Overall Strength: Deficits;Due to pain;Due to precautions (Knee and Ankle WFL; Pt able to complete SLR with min assist) LLE Assessment LLE Assessment: Within Functional Limits Mobility (including Balance) Bed Mobility Bed Mobility: Yes Supine to Sit: 4: Min assist Supine to Sit Details (indicate cue type and reason): VC for hand placement and sequencing as well as to maintain hip precautions during transfer. Assist given for RLE weight during transfer. Sitting - Scoot to Edge of Bed: 5: Supervision Sitting - Scoot to [place] of Bed Details (indicate cue type and reason): VC for hand placement and weight shifting to maintain hip precautions Transfers Transfers: Yes Sit to Stand: 4: Min assist;With upper extremity assist;From bed Sit to Stand Details (indicate cue type and reason): VC for hand placement for safety. Min assist for forward translation into standing Stand to Sit: 4: Min assist;With upper  extremity assist;To chair/3-in-1 Stand to Sit Details: VC for hand placement. min assist for controlled descent into sitting Stand Pivot Transfers: 4: Min assist Stand Pivot Transfer Details (indicate cue type and reason): Transfer from bed to chair. VC for proper step sequencing throughout transfer and maintaining hip precautions during turning.  Ambulation/Gait Ambulation/Gait: No (pt limited by nausea)    Exercise  Total Joint Exercises Ankle Circles/Pumps: AROM;Strengthening;Both;10 reps;Supine Quad Sets: AROM;Strengthening;Right;10 reps;Supine Straight Leg Raises: AAROM;Strengthening;Right;10 reps;Supine End of Session PT - End of Session Equipment Utilized During Treatment: Gait belt Activity Tolerance: Patient tolerated treatment well Patient left: in chair;with call bell in reach;with family/visitor present Nurse Communication: Mobility status for transfers;Mobility status for ambulation General Behavior During Session: [facility] for tasks performed Cognition: [facility] for tasks performed  Milana Kidney 06/25/2011, 9:55 AM  06/25/2011 Milana Kidney DPT PAGER: [phone] OFFICE: [phone]

## 2011-06-25 NOTE — Progress Notes (Signed)
Subjective: 1 Day Post-Op Procedure(s) (LRB): TOTAL HIP ARTHROPLASTY (Right)  Activity level:  Bedrest thusfar Diet tolerance:  Poor with n/v overnight Voiding  Not yet as foley just removed Patient reports pain as moderate.    Objective: Vital signs in last 24 hours: Temp:  [97.6 F (36.4 C)-98.9 F (37.2 C)] 98.2 F (36.8 C) (01/12 0530) Pulse Rate:  [60-102] 61  (01/12 0530) Resp:  [7-18] 18  (01/12 0530) BP: (127-163)/(51-76) 130/51 mmHg (01/12 0530) SpO2:  [92 %-100 %] 99 % (01/12 0530)  Intake/Output from previous day: 01/11 0701 - 01/12 0700 In: 4400.8 [I.V.:3898.8; IV Piggyback:502] Out: 1350 [Urine:750; Blood:600] Intake/Output this shift:     Basename 06/25/11 0500 06/22/11 1100  HGB 8.3* 12.5    Basename 06/25/11 0500 06/22/11 1100  WBC 7.5 6.6  RBC 3.01* 4.52  HCT 24.9* 37.6  PLT 199 222    Basename 06/25/11 0500 06/22/11 1100  NA 135 140  K 3.7 4.3  CL 104 107  CO2 25 23  BUN 12 19  CREATININE 0.67 0.82  GLUCOSE 174* 123*  CALCIUM 8.1* 9.5    Basename 06/25/11 0500 06/22/11 1100  LABPT -- --  INR 1.25 1.06    PHYSICAL EXAM:  Neurologically intact ABD soft Neurovascular intact Sensation intact distally Intact pulses distally Dorsiflexion/Plantar flexion intact Incision: no drainage  Assessment/Plan: 1 Day Post-Op Procedure(s) (LRB): TOTAL HIP ARTHROPLASTY (Right) Advance diet Up with therapy Can wean from IV fluids once taking po a bit better Follow HCT  Jazmon Kos G 06/25/2011, 8:02 AM

## 2011-06-25 NOTE — Progress Notes (Signed)
ANTICOAGULATION CONSULT NOTE - Follow Up Consult  Pharmacy Consult for Coumadin Indication: VTE prophylaxis s/p R THA  Assessment: 66 yo F on Coumadin for VTE prophylaxis. INR below goal but starting to trend up already. No bleeding noted.  Goal of Therapy:  INR 1.5-2   Plan:  1. Repeat Coumadin 5 mg po tonight 2. INR daily   Allergies  Allergen Reactions  . Lipitor (Atorvastatin Calcium)     Leg cramp  . Sitagliptin Phosphate Nausea And Vomiting  . Sulfa Drugs Cross Reactors Nausea And Vomiting    Patient Measurements: Ht: 64" Wt: ~225 lbs  Vital Signs: Temp: 98.2 F (36.8 C) (01/12 0530) BP: 130/51 mmHg (01/12 0530) Pulse Rate: 61  (01/12 0530)  Labs:  Basename 06/25/11 0500  HGB 8.3*  HCT 24.9*  PLT 199  APTT --  LABPROT 16.0*  INR 1.25  HEPARINUNFRC --  CREATININE 0.67  CKTOTAL --  CKMB --  TROPONINI --   The CrCl is unknown because both a height and weight (above a minimum accepted value) are required for this calculation.   Medications:  Scheduled:    . acetaZOLAMIDE  500 mg Oral QHS  . bimatoprost  1 drop Right Eye QHS  . brimonidine-timolol  1 drop Right Eye Q12H  . coumadin book   Does not apply Once  . enoxaparin  40 mg Subcutaneous Q24H  . HYDROmorphone      . insulin aspart  0-15 Units Subcutaneous TID WC  . meloxicam  15 mg Oral Daily  . metFORMIN  500 mg Oral Q breakfast  . nepafenac  1 drop Right Eye TID  . prednisoLONE acetate  1 drop Right Eye QID  . warfarin  5 mg Oral ONCE-1800  . warfarin   Does not apply Once  . DISCONTD: bimatoprost  1 drop Right Eye QHS  . DISCONTD: brimonidine  1 drop Right Eye Q12H  . DISCONTD: brimonidine-timolol  1 drop Right Eye Q12H  . DISCONTD: chlorhexidine  60 mL Topical Once  . DISCONTD: timolol  1 drop Right Eye BID    [name] 06/25/2011,11:05 AM

## 2011-06-26 LAB — CBC
HCT: 25.8 % — ABNORMAL LOW (ref 36.0–46.0)
Hemoglobin: 8.5 g/dL — ABNORMAL LOW (ref 12.0–15.0)
MCH: 27.3 pg (ref 26.0–34.0)
MCHC: 32.9 g/dL (ref 30.0–36.0)
MCV: 83 fL (ref 78.0–100.0)
Platelets: 217 K/uL (ref 150–400)
RBC: 3.11 MIL/uL — ABNORMAL LOW (ref 3.87–5.11)
RDW: 14.5 % (ref 11.5–15.5)
WBC: 9.1 K/uL (ref 4.0–10.5)

## 2011-06-26 LAB — GLUCOSE, CAPILLARY
Glucose-Capillary: 149 mg/dL — ABNORMAL HIGH (ref 70–99)
Glucose-Capillary: 162 mg/dL — ABNORMAL HIGH (ref 70–99)

## 2011-06-26 LAB — PROTIME-INR
INR: 1.49 (ref 0.00–1.49)
Prothrombin Time: 18.3 s — ABNORMAL HIGH (ref 11.6–15.2)

## 2011-06-26 MED ORDER — WARFARIN SODIUM 5 MG PO TABS
5.0000 mg | ORAL_TABLET | Freq: Once | ORAL | Status: AC
  Administered 2011-06-26: 5 mg via ORAL
  Filled 2011-06-26: qty 1

## 2011-06-26 NOTE — Progress Notes (Signed)
Subjective: 2 Days Post-Op Procedure(s) (LRB): TOTAL HIP ARTHROPLASTY (Right)  Activity level:  Slow in therapy due to N/V Diet tolerance:  Sipping fluids Voiding  ok Patient reports pain as 3 on 0-10 scale.    Objective: Vital signs in last 24 hours: Temp:  [97.6 F (36.4 C)-99.8 F (37.7 C)] 99.8 F (37.7 C) (01/13 0555) Pulse Rate:  [66-101] 101  (01/13 0555) Resp:  [18-20] 20  (01/13 0555) BP: (102-112)/(65-68) 107/66 mmHg (01/13 0555) SpO2:  [95 %-99 %] 95 % (01/13 0555)  Intake/Output from previous day: 01/12 0701 - 01/13 0700 In: 600 [P.O.:600] Out: -  Intake/Output this shift:     Basename 06/26/11 0700 06/25/11 0500  HGB 8.5* 8.3*    Basename 06/26/11 0700 06/25/11 0500  WBC 9.1 7.5  RBC 3.11* 3.01*  HCT 25.8* 24.9*  PLT 217 199    Basename 06/25/11 0500  NA 135  K 3.7  CL 104  CO2 25  BUN 12  CREATININE 0.67  GLUCOSE 174*  CALCIUM 8.1*    Basename 06/26/11 0700 06/25/11 0500  LABPT -- --  INR 1.49 1.25    PHYSICAL EXAM:  Neurologically intact ABD soft Neurovascular intact Sensation intact distally Intact pulses distally Dorsiflexion/Plantar flexion intact No cellulitis present Compartment soft Dressing has been changed and is wnl  Assessment/Plan: 2 Days Post-Op Procedure(s) (LRB): TOTAL HIP ARTHROPLASTY (Right) Advance diet Up with therapy Plan for discharge tomorrow  Duel Conrad R 06/26/2011, 11:03 AM

## 2011-06-26 NOTE — Progress Notes (Signed)
ANTICOAGULATION CONSULT NOTE - Follow Up Consult  Pharmacy Consult for Coumadin Indication: VTE prophylaxis s/p R THA  Assessment: 66 yo F on Coumadin for VTE prophylaxis. INR slightly below goal. No bleeding noted.  Goal of Therapy:  INR 1.5-2   Plan:  1. Coumadin 5 mg po tonight 2. INR daily   Allergies  Allergen Reactions  . Lipitor (Atorvastatin Calcium)     Leg cramp  . Sitagliptin Phosphate Nausea And Vomiting  . Sulfa Drugs Cross Reactors Nausea And Vomiting    Patient Measurements: Ht: 64" Wt: ~225 lbs  Vital Signs: Temp: 99.8 F (37.7 C) (01/13 0555) BP: 107/66 mmHg (01/13 0555) Pulse Rate: 101  (01/13 0555)  Labs:  Basename 06/26/11 0700 06/25/11 0500  HGB 8.5* 8.3*  HCT 25.8* 24.9*  PLT 217 199  APTT -- --  LABPROT 18.3* 16.0*  INR 1.49 1.25  HEPARINUNFRC -- --  CREATININE -- 0.67  CKTOTAL -- --  CKMB -- --  TROPONINI -- --   Estimated Creatinine Clearance: 81.5 ml/min (by C-G formula based on Cr of 0.67).   Medications:  Scheduled:     . acetaZOLAMIDE  500 mg Oral QHS  . bimatoprost  1 drop Right Eye QHS  . brimonidine-timolol  1 drop Right Eye Q12H  . enoxaparin  40 mg Subcutaneous Q24H  . insulin aspart  0-15 Units Subcutaneous TID WC  . meloxicam  15 mg Oral Daily  . metFORMIN  500 mg Oral Q breakfast  . nepafenac  1 drop Right Eye TID  . prednisoLONE acetate  1 drop Right Eye QID  . warfarin  5 mg Oral ONCE-1800  . warfarin   Does not apply Once    [facility] 06/26/2011,12:52 PM

## 2011-06-26 NOTE — Progress Notes (Signed)
Physical Therapy Treatment Patient Details Name: [name] MRN: [id number] DOB: March 13, 1946 Today's Date: 06/26/2011  PT Assessment/Plan  PT - Assessment/Plan Comments on Treatment Session: Pt progressing well. She was less nauseus today and was able to ambulate a great distance as well as complete stairs. Will continue per plan and family education prior to d/c PT Plan: Discharge plan remains appropriate;Frequency remains appropriate PT Frequency: 7X/week Follow Up Recommendations: Home health PT;Supervision - Intermittent Equipment Recommended: None recommended by PT PT Goals  Acute Rehab PT Goals PT Goal Formulation: With patient PT Goal: Supine/Side to Sit - Progress: Progressing toward goal PT Goal: Sit to Stand - Progress: Progressing toward goal PT Goal: Stand to Sit - Progress: Progressing toward goal PT Transfer Goal: Bed to Chair/Chair to Bed - Progress: Progressing toward goal PT Goal: Ambulate - Progress: Progressing toward goal PT Goal: Up/Down Stairs - Progress: Met PT Goal: Perform Home Exercise Program - Progress: Progressing toward goal Additional Goals PT Goal: Additional Goal #1 - Progress: Progressing toward goal  PT Treatment Precautions/Restrictions  Precautions Precautions: Posterior Hip Restrictions Weight Bearing Restrictions: Yes RLE Weight Bearing: Weight bearing as tolerated Mobility (including Balance) Bed Mobility Bed Mobility: Yes Supine to Sit: 4: Min assist;With rails;HOB elevated (Comment degrees) (35) Supine to Sit Details (indicate cue type and reason): VC for min assist of RLE secondary to weakness. Pt able to control trunk into sitting Sitting - Scoot to Edge of Bed: 6: Modified independent (Device/Increase time) Transfers Transfers: Yes Sit to Stand: 5: Supervision;With upper extremity assist;From bed Sit to Stand Details (indicate cue type and reason): VC for hand placement. Pt able to stand up safely to RW without physical  assist Stand to Sit: 5: Supervision;With upper extremity assist;To chair/3-in-1 Stand to Sit Details: VC for hand and leg placement to maintain hip precautions Ambulation/Gait Ambulation/Gait: Yes Ambulation/Gait Assistance: 4: Min assist (Minguard assist) Ambulation/Gait Assistance Details (indicate cue type and reason): VC for sequencing and safety with RW as well as cues during turning for hip precautions Ambulation Distance (Feet): 75 Feet Assistive device: Rolling walker Gait Pattern: Step-to pattern;Step-through pattern;Decreased stride length;Decreased hip/knee flexion - right;Trunk flexed Gait velocity: Decreased gait speed Stairs: Yes Stairs Assistance: 4: Min assist Stairs Assistance Details (indicate cue type and reason): Pt and husband educated on proper stair sequencing and supervision/assist needed for him upon d/c.  Stair Management Technique: Backwards;No rails;With walker Number of Stairs: 2     Exercise  Total Joint Exercises Quad Sets: AROM;Strengthening;Right;10 reps;Supine Short Arc Quad: AROM;Strengthening;Right;10 reps;Supine Hip ABduction/ADduction: AROM;Strengthening;Right;10 reps;Supine Straight Leg Raises: AAROM;Strengthening;Right;10 reps;Supine End of Session PT - End of Session Equipment Utilized During Treatment: Gait belt Activity Tolerance: Patient tolerated treatment well Patient left: in chair;with call bell in reach;with family/visitor present Nurse Communication: Mobility status for transfers;Mobility status for ambulation General Behavior During Session: [facility] for tasks performed Cognition: [facility] for tasks performed  Milana Kidney 06/26/2011, 1:07 PM  06/26/2011 Milana Kidney DPT PAGER: [phone] OFFICE: [phone]

## 2011-06-26 NOTE — Progress Notes (Addendum)
OT Cancellation Note  Treatment cancelled today due to patient with N/V at this time. RN informed and has provided medication. Will check back as schedule allows. Thank you!   Jayleen Scaglione, OTR/L 06/26/2011, 9:39 AM  Checked back again with patient who had just been brought lunch from outside of hospital. Will try again as schedule allows. 2:47 PM

## 2011-06-27 ENCOUNTER — Encounter (HOSPITAL_COMMUNITY): Payer: Self-pay | Admitting: Orthopedic Surgery

## 2011-06-27 LAB — GLUCOSE, CAPILLARY
Glucose-Capillary: 136 mg/dL — ABNORMAL HIGH (ref 70–99)
Glucose-Capillary: 139 mg/dL — ABNORMAL HIGH (ref 70–99)
Glucose-Capillary: 132 mg/dL — ABNORMAL HIGH (ref 70–99)

## 2011-06-27 LAB — HEMOGLOBIN AND HEMATOCRIT, BLOOD
HCT: 26.7 % — ABNORMAL LOW (ref 36.0–46.0)
Hemoglobin: 9.1 g/dL — ABNORMAL LOW (ref 12.0–15.0)

## 2011-06-27 LAB — CBC
HCT: 21.6 % — ABNORMAL LOW (ref 36.0–46.0)
Hemoglobin: 7.2 g/dL — ABNORMAL LOW (ref 12.0–15.0)
MCH: 27.8 pg (ref 26.0–34.0)
MCHC: 33.3 g/dL (ref 30.0–36.0)
MCV: 83.4 fL (ref 78.0–100.0)
Platelets: 157 10*3/uL (ref 150–400)
RBC: 2.59 MIL/uL — ABNORMAL LOW (ref 3.87–5.11)
RDW: 14.5 % (ref 11.5–15.5)
WBC: 5.3 10*3/uL (ref 4.0–10.5)

## 2011-06-27 LAB — PROTIME-INR
INR: 2.14 — ABNORMAL HIGH (ref 0.00–1.49)
Prothrombin Time: 24.3 seconds — ABNORMAL HIGH (ref 11.6–15.2)

## 2011-06-27 LAB — PREPARE RBC (CROSSMATCH)

## 2011-06-27 MED ORDER — WARFARIN SODIUM 2.5 MG PO TABS
2.5000 mg | ORAL_TABLET | Freq: Once | ORAL | Status: DC
  Filled 2011-06-27: qty 1

## 2011-06-27 MED ORDER — WARFARIN SODIUM 5 MG PO TABS: ORAL_TABLET | ORAL | Status: DC

## 2011-06-27 MED ORDER — OXYCODONE-ACETAMINOPHEN 5-325 MG PO TABS: 1.0000 | ORAL_TABLET | ORAL | Status: AC | PRN

## 2011-06-27 NOTE — Progress Notes (Addendum)
ANTICOAGULATION CONSULT NOTE - Follow Up Consult  Pharmacy Consult for Coumadin Indication: VTE prophylaxis s/p R THA  Assessment: 66 yo F on Coumadin for VTE prophylaxis. INR =2.14, slightly above physician's desired goal. Hgb trending down - suspect secondary to post-op ABLA (plan is PRBC x1). No bleeding noted.  Goal of Therapy:  INR 1.5-2   Plan:  1. Coumadin 2.5 mg po tonight 2. Plan is to go home today, recommend 2.5mg  daily until next f/u INR.  3. If INR > 1.5 1/15, then D/C enoxaparin (dose already given 1/14am 8am).    Allergies  Allergen Reactions  . Lipitor (Atorvastatin Calcium)     Leg cramp  . Sitagliptin Phosphate Nausea And Vomiting  . Sulfa Drugs Cross Reactors Nausea And Vomiting    Patient Measurements: Ht: 64" Wt: ~225 lbs  Vital Signs: Temp: 97.3 F (36.3 C) (01/14 0600) BP: 131/60 mmHg (01/14 0600) Pulse Rate: 83  (01/14 0600)  Labs:  Basename 06/27/11 0556 06/26/11 0700 06/25/11 0500  HGB 7.2* 8.5* --  HCT 21.6* 25.8* 24.9*  PLT 157 217 199  APTT -- -- --  LABPROT 24.3* 18.3* 16.0*  INR 2.14* 1.49 1.25  HEPARINUNFRC -- -- --  CREATININE -- -- 0.67  CKTOTAL -- -- --  CKMB -- -- --  TROPONINI -- -- --   Estimated Creatinine Clearance: 81.5 ml/min (by C-G formula based on Cr of 0.67).   Medications:  Scheduled:     . acetaZOLAMIDE  500 mg Oral QHS  . bimatoprost  1 drop Right Eye QHS  . brimonidine-timolol  1 drop Right Eye Q12H  . enoxaparin  40 mg Subcutaneous Q24H  . insulin aspart  0-15 Units Subcutaneous TID WC  . meloxicam  15 mg Oral Daily  . metFORMIN  500 mg Oral Q breakfast  . nepafenac  1 drop Right Eye TID  . prednisoLONE acetate  1 drop Right Eye QID  . warfarin  5 mg Oral ONCE-1800  . warfarin   Does not apply Once    Dannielle Huh 06/27/2011,8:25 AM

## 2011-06-27 NOTE — Progress Notes (Signed)
Physical Therapy Treatment Patient Details Name: ZOEI AMISON MRN: [id number] DOB: [birth year] Today's Date: 06/27/2011  PT Assessment/Plan  PT - Assessment/Plan Comments on Treatment Session: Pt with improved energy this session.  Preferred steps with railing vs. without rail with RW.  Pt says she can do the front stairs with a railing for home entry. PT Plan: Discharge plan remains appropriate PT Frequency: 7X/week Follow Up Recommendations: Home health PT;Supervision - Intermittent Equipment Recommended: None recommended by OT PT Goals  Acute Rehab PT Goals Time For Goal Achievement: 7 days Pt will go Supine/Side to Sit: with modified independence PT Goal: Supine/Side to Sit - Progress: Progressing toward goal Pt will go Sit to Stand: with modified independence PT Goal: Sit to Stand - Progress: Progressing toward goal Pt will go Stand to Sit: with modified independence PT Goal: Stand to Sit - Progress: Progressing toward goal Pt will Transfer Bed to Chair/Chair to Bed: with supervision PT Transfer Goal: Bed to Chair/Chair to Bed - Progress: Progressing toward goal Pt will Ambulate: 51 - 150 feet;with supervision;with rolling walker PT Goal: Ambulate - Progress: Met Pt will Go Up / Down Stairs: 1-2 stairs;with rail(s);with min assist PT Goal: Up/Down Stairs - Progress: Met Pt will Perform Home Exercise Program: Independently PT Goal: Perform Home Exercise Program - Progress: Other (comment) Additional Goals Additional Goal #1: Pt will be able to recall and implement 3/3 hip precautions PT Goal: Additional Goal #1 - Progress: Progressing toward goal  PT Treatment Precautions/Restrictions  Precautions Precautions: Posterior Hip Restrictions Weight Bearing Restrictions: Yes RLE Weight Bearing: Weight bearing as tolerated Mobility (including Balance) Bed Mobility Bed Mobility: Yes Supine to Sit: 5: Supervision Sitting - Scoot to Edge of Bed: 6: Modified independent  (Device/Increase time) Sit to Supine: 4: Min assist Sit to Supine - Details (indicate cue type and reason): Physical assist for Right LE; showed pt and husband technique for using sheet roll as leg lifter Transfers Sit to Stand: 5: Supervision;With upper extremity assist Sit to Stand Details (indicate cue type and reason): from recliner chair, cues for proper hand placement Stand to Sit: 5: Supervision Stand to Sit Details: cues for hand placement Ambulation/Gait Ambulation/Gait Assistance: 5: Supervision Ambulation/Gait Assistance Details (indicate cue type and reason): cues for posture Ambulation Distance (Feet): 200 Feet Assistive device: Rolling walker Gait Pattern: Decreased step length - right;Decreased step length - left (short distance back to bed for transfusion) Gait velocity: decreased gait speed Stairs: Yes Stairs Assistance: 5: Supervision Stairs Assistance Details (indicate cue type and reason): cues for sequencing Stair Management Technique: One rail Right Number of Stairs: 3  (x 2 attempts)  Attempted stairs with railing and with RW without rails. Pt appears more confident with railing vs. RW.  Pt states she can use her front entry with railings for home entry. Wheelchair Mobility Wheelchair Mobility: No   End of Session PT - End of Session Equipment Utilized During Treatment: Gait belt Activity Tolerance: Patient tolerated treatment well Patient left: in bed;with call bell in reach General Behavior During Session: [facility] for tasks performed Cognition: [facility] for tasks performed  Normajean Nash 06/27/2011, 3:19 PM

## 2011-06-27 NOTE — Discharge Summary (Signed)
Patient ID: [name] MRN: [id number] DOB/AGE: Dec 01, 1945 66 y.o.  Admit date: 06/24/2011 Discharge date: 06/27/2011  Admission Diagnoses:  Principal Problem:  *OSTEOARTHRITIS, HIP   Discharge Diagnoses:  Same  Past Medical History  Diagnosis Date  . DIABETES MELLITUS, TYPE II 01/04/2007  . HYPERLIPIDEMIA 01/04/2007  . Overweight 01/04/2007  . ANXIETY 01/04/2007  . GLAUCOMA 07/30/2008  . OTITIS MEDIA, ACUTE, BILATERAL 02/29/2008  . OSTEOARTHRITIS, HIP 09/25/2009  . LEG PAIN, LEFT 07/06/2007  . Dizziness and giddiness 02/29/2008  . NUMBNESS 07/30/2008  . TRANSIENT ISCHEMIC ATTACK, HX OF 01/04/2007  . DVT, HX OF 07/06/2007  . PONV (postoperative nausea and vomiting)   . SLEEP APNEA, OBSTRUCTIVE 01/04/2007    history was on a CPAP but has resolved with weight loss  . HYPERTENSION 01/04/2007    does not see a cardiologist  . GERD 01/04/2007    pt reports resolved with weight loss    Surgeries: Procedure(s): TOTAL HIP ARTHROPLASTY on 06/24/2011   Consultants:    Discharged Condition: Improved  Hospital Course: [name] is an 66 y.o. female who was admitted 06/24/2011 for operative treatment ofOsteoarthrosis, unspecified whether generalized or localized, pelvic region and thigh. Patient has severe unremitting pain that affects sleep, daily activities, and work/hobbies. After pre-op clearance the patient was taken to the operating room on 06/24/2011 and underwent  Procedure(s): TOTAL HIP ARTHROPLASTY.    Patient was given perioperative antibiotics: Anti-infectives     Start     Dose/Rate Route Frequency Ordered Stop   06/23/11 1715   ceFAZolin (ANCEF) IVPB 2 g/50 mL premix     Comments: Changed per [facility]BP      2 g 100 mL/hr over 30 Minutes Intravenous  Once 06/23/11 1714 06/24/11 0733           Patient was given sequential compression devices, early ambulation, and chemoprophylaxis to prevent DVT. Pt received 1 unit PRBC for acute blood loss anemia 06/27/11  Patient benefited  maximally from hospital stay and there were no complications.    Recent vital signs: Patient Vitals for the past 24 hrs:  BP Temp Temp src Pulse Resp SpO2  06/27/11 1300 124/82 mmHg 98.6 F (37 C) Oral 86  18  -  06/27/11 1200 111/71 mmHg 98.8 F (37.1 C) Oral 85  18  -  06/27/11 1050 115/59 mmHg 97.8 F (36.6 C) Oral 82  18  -  06/27/11 1000 114/67 mmHg 98.6 F (37 C) Oral 87  16  -  06/27/11 0600 131/60 mmHg 97.3 F (36.3 C) - 83  16  95 %  Jul 06, 2011 2130 122/64 mmHg 98.7 F (37.1 C) - 93  16  95 %     Recent laboratory studies:  Basename 06/27/11 0556 2011/07/06 0700 06/25/11 0500  WBC 5.3 9.1 --  HGB 7.2* 8.5* --  HCT 21.6* 25.8* --  PLT 157 217 --  NA -- -- 135  K -- -- 3.7  CL -- -- 104  CO2 -- -- 25  BUN -- -- 12  CREATININE -- -- 0.67  GLUCOSE -- -- 174*  INR 2.14* 1.49 --  CALCIUM -- -- 8.1*     Discharge Medications:  Current Discharge Medication List    START taking these medications   Details  oxyCODONE-acetaminophen (ROXICET) 5-325 MG per tablet Take 1-2 tablets by mouth every 4 (four) hours as needed for pain. Qty: 60 tablet, Refills: 0    warfarin (COUMADIN) 5 MG tablet Take as directed by HHN to  maintain INR 1.2-2.0 Qty: 30 tablet, Refills: 0      CONTINUE these medications which have NOT CHANGED   Details  acetaZOLAMIDE (DIAMOX) 500 MG capsule Take 500 mg by mouth at bedtime.      bimatoprost (LUMIGAN) 0.01 % SOLN Place 1 drop into the right eye at bedtime.     Brimonidine Tartrate-Timolol (COMBIGAN OP) Place 1 drop into the right eye 2 (two) times daily.     lisinopril-hydrochlorothiazide (PRINZIDE,ZESTORETIC) 20-12.5 MG per tablet Take 1 tablet by mouth daily.      metFORMIN (GLUCOPHAGE) 500 MG tablet Take 1 tablet (500 mg total) by mouth daily with breakfast. Qty: 90 tablet, Refills: 3    nepafenac (NEVANAC) 0.1 % ophthalmic suspension Place 1 drop into the right eye 3 (three) times daily.      prednisoLONE acetate (PRED FORTE) 1 %  ophthalmic suspension Place 1 drop into the right eye 4 (four) times daily.       STOP taking these medications     acetaminophen (TYLENOL) 500 MG tablet      meloxicam (MOBIC) 15 MG tablet         Diagnostic Studies: Dg Pelvis Portable  06/24/2011  *RADIOLOGY REPORT*  Clinical Data: Postop hip replacement.  PORTABLE PELVIS  Comparison: None.  Findings: AP view of the total right hip replacement appears in satisfactory position.  Moderate left hip joint degenerative changes.  IMPRESSION: AP view of the total right hip replacement appears in satisfactory position.  Moderate left hip joint degenerative changes.  Original Report Authenticated By: Fuller Canada, M.D.    Disposition: Home or Self Care  Discharge Orders    Future Appointments: Provider: Department: Dept Phone: Center:   10/24/2011 8:30 AM Sherrie George, MD [facility] [phone] None     Future Orders Please Complete By Expires   Diet - low sodium heart healthy      Call MD / Call 911      Comments:   If you experience chest pain or shortness of breath, CALL 911 and be transported to the hospital emergency room.  If you develope a fever above 101 F, pus (white drainage) or increased drainage or redness at the wound, or calf pain, call your surgeon's office.   Constipation Prevention      Comments:   Drink plenty of fluids.  Prune juice may be helpful.  You may use a stool softener, such as Colace (over the counter) 100 mg twice a day.  Use MiraLax (over the counter) for constipation as needed.   Increase activity slowly as tolerated      Weight Bearing as taught in Physical Therapy      Comments:   Use a walker or crutches as instructed.   Patient may shower      Comments:   You may shower without a dressing once there is no drainage.  Do not wash over the wound.  If drainage remains, cover wound with plastic wrap and then shower.   Driving restrictions      Comments:   No driving for 2 weeks   Follow  the hip precautions as taught in Physical Therapy      Change dressing      Comments:   You may change your dressing on POD #5      Follow-up Information    Follow up with [facility]   Contact information:   Research scientist (physical sciences) Sports Medicine [address] [place] [address]  [phone]           Signed: Nestor Lewandowsky 06/27/2011, 2:20 PM

## 2011-06-27 NOTE — Progress Notes (Signed)
  CARE MANAGEMENT NOTE 06/27/2011   Discharge planning. Spoke with patient and husband. Choice offered. Preoperatively setup with [facility], no changes. Has rolling walker, needs 3in1. entered in [facility], husband buying hip kit in gift shop.

## 2011-06-27 NOTE — Progress Notes (Signed)
UR COMPLETED  

## 2011-06-27 NOTE — Progress Notes (Signed)
Occupational Therapy Evaluation Patient Details Name: [name] MRN: [id number] DOB: Dec 15, 1945 Today's Date: 06/27/2011  Problem List:  Patient Active Problem List  Diagnoses  . DIABETES MELLITUS, TYPE II  . HYPERLIPIDEMIA  . Overweight  . ANXIETY  . SLEEP APNEA, OBSTRUCTIVE  . GLAUCOMA  . HYPERTENSION  . ASTHMA  . GERD  . OSTEOARTHRITIS, HIP  . LEG PAIN, LEFT  . Dizziness and giddiness  . NUMBNESS  . TRANSIENT ISCHEMIC ATTACK, HX OF  . DVT, HX OF  . Preventative health care    Past Medical History:  Past Medical History  Diagnosis Date  . DIABETES MELLITUS, TYPE II 01/04/2007  . HYPERLIPIDEMIA 01/04/2007  . Overweight 01/04/2007  . ANXIETY 01/04/2007  . GLAUCOMA 07/30/2008  . OTITIS MEDIA, ACUTE, BILATERAL 02/29/2008  . OSTEOARTHRITIS, HIP 09/25/2009  . LEG PAIN, LEFT 07/06/2007  . Dizziness and giddiness 02/29/2008  . NUMBNESS 07/30/2008  . TRANSIENT ISCHEMIC ATTACK, HX OF 01/04/2007  . DVT, HX OF 07/06/2007  . PONV (postoperative nausea and vomiting)   . SLEEP APNEA, OBSTRUCTIVE 01/04/2007    history was on a CPAP but has resolved with weight loss  . HYPERTENSION 01/04/2007    does not see a cardiologist  . GERD 01/04/2007    pt reports resolved with weight loss   Past Surgical History:  Past Surgical History  Procedure Date  . Cholecystectomy   . Abdominal hysterectomy 1999  . Oophorectomy   . Eye surgery     shunt left and lazer eye surgery on right cataract and retenia tear with repair  . Knee arthroscopy   . Growth removal     from thumb    OT Assessment/Plan/Recommendation OT Assessment Clinical Impression Statement: This 66 y.o. female presents to OT s/p THR.  Alll education completed.  Pt. demonstrates good understanding of precautions and use of AE and DME.  No further OT needs identified OT Recommendation/Assessment: Patient does not need any further OT services OT Recommendation Follow Up Recommendations: No OT follow up Equipment Recommended: None  recommended by OT OT Goals    OT Evaluation Precautions/Restrictions  Precautions Precautions: Posterior Hip Restrictions Weight Bearing Restrictions: Yes RLE Weight Bearing: Weight bearing as tolerated Prior Functioning Home Living Lives With: Spouse Receives Help From: Family Type of Home: House Home Layout: One level Home Access: Stairs to enter Entrance Stairs-Rails: Right Entrance Stairs-Number of Steps: 2 Bathroom Shower/Tub: Psychologist, counselling;Door [employer] Toilet: Standard Bathroom Accessibility: Yes How Accessible: Accessible via walker Home Adaptive Equipment: Walker - rolling Additional Comments: Husband purchased hip kit today Prior Function Level of Independence: Independent with basic ADLs;Independent with homemaking with ambulation;Independent with transfers;Independent with gait Able to Take Stairs?: Yes Driving: No Vocation: On disability Comments: Husband able to provide assistance as necessary ADL ADL Eating/Feeding: Simulated;Independent Where Assessed - Eating/Feeding: Chair Grooming: Simulated;Wash/dry hands;Wash/dry face;Teeth care;Brushing hair;Supervision/safety Where Assessed - Grooming: Standing at sink Upper Body Bathing: Simulated;Set up Where Assessed - Upper Body Bathing: Sitting, chair Lower Body Bathing: Simulated;Supervision/safety Lower Body Bathing Details (indicate cue type and reason): using LH sponge Where Assessed - Lower Body Bathing: Sit to stand from bed Upper Body Dressing: Set up;Simulated Where Assessed - Upper Body Dressing: Sitting, bed Lower Body Dressing: Performed;Supervision/safety Lower Body Dressing Details (indicate cue type and reason): Pt instructed in use of LB AE for ADLs Where Assessed - Lower Body Dressing: Sit to stand from bed Toilet Transfer: Simulated;Supervision/safety Toilet Transfer Method: Ambulating Toilet Transfer Equipment: Raised toilet seat with arms (or 3-in-1 over toilet)  Toileting - Clothing  Manipulation: Performed;Supervision/safety Where Assessed - Toileting Clothing Manipulation: Standing Toileting - Hygiene: Simulated;Modified independent Where Assessed - Toileting Hygiene: Sit to stand from 3-in-1 or toilet Tub/Shower Transfer: Performed;Minimal assistance (min guard assist - simulated 4" step into bathroom shower) Tub/Shower Transfer Method: Ambulating Equipment Used: Rolling walker;Reacher;Long-handled sponge;Long-handled shoe horn;Sock aid Ambulation Related to ADLs: Pt. ambulating with supervision. Does require min cues to avoid internal rotation of Rt. LE when turning ADL Comments: Pt. was instructed in use of AE for LB dsg.  Pt. demonstrates good understanding of THR precautions.   Discussed seat height for chair at home Vision/Perception    Cognition Cognition Arousal/Alertness: Awake/alert Overall Cognitive Status: Appears within functional limits for tasks assessed Orientation Level: Oriented X4 Sensation/Coordination Coordination Gross Motor Movements are Fluid and Coordinated: Yes Fine Motor Movements are Fluid and Coordinated: Yes Extremity Assessment RUE Assessment RUE Assessment: Within Functional Limits LUE Assessment LUE Assessment: Within Functional Limits Mobility  Bed Mobility Bed Mobility: Yes Supine to Sit: 5: Supervision Sitting - Scoot to Edge of Bed: 6: Modified independent (Device/Increase time)   Transfers Transfers: Yes Sit to Stand: 5: Supervision;With upper extremity assist;From chair/3-in-1  Stand to Sit: 5: Supervision;With upper extremity assist;To bed Stand to Sit Details: cues to preposition for posterior precautions Exercises End of Session OT - End of Session Activity Tolerance: Patient tolerated treatment well Patient left: in chair;with call bell in reach General Behavior During Session: [facility] for tasks performed Cognition: [facility] for tasks performed   Tamara Kenyon M 06/27/2011, 2:58 PM

## 2011-06-27 NOTE — Progress Notes (Signed)
Physical Therapy Treatment Patient Details Name: [name] MRN: [id number] DOB: 19-Jan-1946 Today's Date: 06/27/2011  PT Assessment/Plan  PT - Assessment/Plan Comments on Treatment Session: pt/family ed with husband for stairs and gait. pt is safe to provide supervision for gait with RW and stairs with 1 handrail on R for home entry PT Plan: Discharge plan remains appropriate PT Frequency: 7X/week Follow Up Recommendations: Home health PT Equipment Recommended: None recommended by OT PT Goals  Acute Rehab PT Goals Time For Goal Achievement: 7 days Pt will go Supine/Side to Sit: with modified independence PT Goal: Supine/Side to Sit - Progress: Progressing toward goal Pt will go Sit to Stand: with modified independence PT Goal: Sit to Stand - Progress: Progressing toward goal Pt will go Stand to Sit: with modified independence PT Goal: Stand to Sit - Progress: Progressing toward goal Pt will Transfer Bed to Chair/Chair to Bed: with supervision PT Transfer Goal: Bed to Chair/Chair to Bed - Progress: Progressing toward goal Pt will Ambulate: 51 - 150 feet;with supervision;with rolling walker PT Goal: Ambulate - Progress: Met Pt will Go Up / Down Stairs: 1-2 stairs;with rail(s);with min assist PT Goal: Up/Down Stairs - Progress: Met Pt will Perform Home Exercise Program: Independently PT Goal: Perform Home Exercise Program - Progress: Other (comment) Additional Goals Additional Goal #1: Pt will be able to recall and implement 3/3 hip precautions PT Goal: Additional Goal #1 - Progress: Progressing toward goal  PT Treatment Precautions/Restrictions  Precautions Precautions: Posterior Hip Restrictions Weight Bearing Restrictions: Yes RLE Weight Bearing: Weight bearing as tolerated Mobility (including Balance) Transfers Sit to Stand: 5: Supervision Sit to Stand Details (indicate cue type and reason): from recliner chair, cues for proper hand placement Stand to Sit: 5:  Supervision Stand to Sit Details: cues for hand placement Ambulation/Gait Ambulation/Gait Assistance: 5: Supervision Ambulation/Gait Assistance Details (indicate cue type and reason): with husband providing supervision Ambulation Distance (Feet): 100 Feet Assistive device: Rolling walker Gait Pattern: Decreased step length - right;Decreased step length - left (short distance back to bed for transfusion) Gait velocity: decreased cadence  Stairs: Yes Stairs Assistance: 5: Supervision Stairs Assistance Details (indicate cue type and reason): husband providing supervision Stair Management Technique: One rail Right Number of Stairs: 3  Wheelchair Mobility Wheelchair Mobility: No   End of Session PT - End of Session Equipment Utilized During Treatment: Gait belt Activity Tolerance: Patient tolerated treatment well Patient left: in chair;with call bell in reach;with family/visitor present Nurse Communication: Mobility status for transfers;Mobility status for ambulation General Behavior During Session: [facility] for tasks performed Cognition: [facility] for tasks performed  [facility] 06/27/2011, 3:46 PM

## 2011-06-27 NOTE — Progress Notes (Signed)
Physical Therapy Treatment Patient Details Name: [name] MRN: [id number] DOB: 02-24-1946 Today's Date: 06/27/2011  PT Assessment/Plan  PT - Assessment/Plan Comments on Treatment Session: solid session, though limited by fatigue and some nausea (Hgb7.2); pt to receive blood and plan on PM session to review steps PT Plan: Discharge plan remains appropriate;Frequency remains appropriate PT Frequency: 7X/week Follow Up Recommendations: Home health PT;Supervision - Intermittent Equipment Recommended: None recommended by PT PT Goals  Acute Rehab PT Goals Time For Goal Achievement: 7 days Pt will go Supine/Side to Sit: with modified independence PT Goal: Supine/Side to Sit - Progress: Progressing toward goal Pt will go Sit to Stand: with modified independence PT Goal: Sit to Stand - Progress: Progressing toward goal Pt will go Stand to Sit: with modified independence PT Goal: Stand to Sit - Progress: Progressing toward goal Pt will Transfer Bed to Chair/Chair to Bed: with supervision PT Transfer Goal: Bed to Chair/Chair to Bed - Progress: Progressing toward goal Pt will Ambulate: 51 - 150 feet;with supervision;with rolling walker PT Goal: Ambulate - Progress:  (limited activity tol) Pt will Go Up / Down Stairs: 1-2 stairs;with rail(s);with min assist PT Goal: Up/Down Stairs - Progress: Other (comment) Pt will Perform Home Exercise Program: Independently PT Goal: Perform Home Exercise Program - Progress: Other (comment) Additional Goals Additional Goal #1: Pt will be able to recall and implement 3/3 hip precautions PT Goal: Additional Goal #1 - Progress: Progressing toward goal  PT Treatment Precautions/Restrictions  Precautions Precautions: Posterior Hip Restrictions Weight Bearing Restrictions: Yes RLE Weight Bearing: Weight bearing as tolerated Mobility (including Balance) Bed Mobility Bed Mobility: Yes Sit to Supine: 4: Min assist Sit to Supine - Details (indicate cue type  and reason): Physical assist for Right LE; showed pt and husband technique for using sheet roll as leg lifter Transfers Sit to Stand: 5: Supervision;With upper extremity assist;From chair/3-in-1 Sit to Stand Details (indicate cue type and reason): cues for positioning for posterior precautions Stand to Sit: 5: Supervision;With upper extremity assist;To bed Stand to Sit Details: cues to preposition for posterior precautions Ambulation/Gait Ambulation/Gait Assistance: 4: Min assist (without physical contact) Ambulation/Gait Assistance Details (indicate cue type and reason): cues for posterior hip precautions with turns Ambulation Distance (Feet): 10 Feet Assistive device: Rolling walker Gait Pattern: Decreased step length - right;Decreased step length - left (short distance back to bed for transfusion) Gait velocity: Decreased gait speed Stairs:  (plan for stair negotiation for PM session)    Exercise  Total Joint Exercises Ankle Circles/Pumps: AROM;Strengthening;Both;10 reps;Supine Quad Sets: AROM;Strengthening;Right;10 reps;Supine Gluteal Sets: AROM;Both;10 reps;Supine Towel Squeeze: AROM;Right;10 reps;Supine Heel Slides: AAROM;AROM;10 reps;Right;Supine (AAROM progressing to AROM) Hip ABduction/ADduction: AAROM;Right;10 reps;Supine End of Session PT - End of Session Activity Tolerance:  (decr tol; pt with low Hgb) Patient left: in bed;with call bell in reach;with family/visitor present Nurse Communication: Mobility status for transfers;Mobility status for ambulation General Behavior During Session: [facility] for tasks performed Cognition: [facility] for tasks performed  Van Clines [facility] [place], [place] [phone]  06/27/2011, 12:27 PM

## 2011-06-27 NOTE — Progress Notes (Signed)
Patient ID: [name], female   DOB: 1945-09-25, 66 y.o.   MRN: [id number] PATIENT ID: [name]  MRN: [id number]  DOB/AGE:  Feb 18, 1946 / 66 y.o.  3 Days Post-Op Procedure(s) (LRB): TOTAL HIP ARTHROPLASTY (Right)    PROGRESS NOTE Subjective: Patient is alert, oriented, 1 Nausea, 1 x Vomiting, yes passing gas, yes Bowel Movement. Taking PO well. Denies SOB, Chest or Calf Pain. Using Incentive Spirometer, PAS in place. Ambulate 150 feet Patient reports pain as 2 on 0-10 scale  .    Objective: Vital signs in last 24 hours: Filed Vitals:   06/25/11 2334 06/26/11 0555 06/26/11 1359 06/26/11 2130  BP: 102/65 107/66 145/64 122/64  Pulse: 66 101 100 93  Temp: 97.6 F (36.4 C) 99.8 F (37.7 C) 100.2 F (37.9 C) 98.7 F (37.1 C)  TempSrc:   Oral   Resp: 20 20 20 16   Height:      Weight:      SpO2: 99% 95% 100% 95%      Intake/Output from previous day:     Intake/Output this shift:     LABORATORY DATA:  Basename 06/27/11 0556 06/26/11 2136 06/26/11 1133 06/26/11 0725 06/26/11 0700 06/25/11 0500  WBC 5.3 -- -- -- 9.1 --  HGB 7.2* -- -- -- 8.5* --  HCT 21.6* -- -- -- 25.8* --  PLT 157 -- -- -- 217 --  NA -- -- -- -- -- 135  K -- -- -- -- -- 3.7  CL -- -- -- -- -- 104  CO2 -- -- -- -- -- 25  BUN -- -- -- -- -- 12  CREATININE -- -- -- -- -- 0.67  GLUCOSE -- -- -- -- -- 174*  GLUCAP -- 136* 162* 149* -- --  INR 2.14* -- -- -- 1.49 --  CALCIUM -- -- -- -- -- 8.1*    Examination: Neurologically intact ABD soft Neurovascular intact Sensation intact distally Intact pulses distally Dorsiflexion/Plantar flexion intact Incision: dressing C/D/I No cellulitis present Compartment soft} XR AP&Lat of hip shows well placed\fixed THA  Assessment:   3 Days Post-Op Procedure(s) (LRB): TOTAL HIP ARTHROPLASTY (Right) ADDITIONAL DIAGNOSIS:  Acute Blood Loss Anemia  Plan: PT/OT WBAT, THA  posterior precautions  DVT Prophylaxis: Lovenox\Coumadin bridge, monitor INR 1.5-2.0  target  DISCHARGE PLAN: Home  DISCHARGE NEEDS: HHPT, HHRN, CPM, Walker and 3-in-1 comode seat  Pt dizzy will xfuse 1 unit PRBC, may still D/C today.

## 2011-06-29 LAB — GLUCOSE, CAPILLARY: Glucose-Capillary: 148 mg/dL — ABNORMAL HIGH (ref 70–99)

## 2011-07-14 DIAGNOSIS — H2510 Age-related nuclear cataract, unspecified eye: Secondary | ICD-10-CM | POA: Diagnosis not present

## 2011-07-14 DIAGNOSIS — R269 Unspecified abnormalities of gait and mobility: Secondary | ICD-10-CM | POA: Diagnosis not present

## 2011-07-14 DIAGNOSIS — Z471 Aftercare following joint replacement surgery: Secondary | ICD-10-CM | POA: Diagnosis not present

## 2011-07-14 DIAGNOSIS — Z96649 Presence of unspecified artificial hip joint: Secondary | ICD-10-CM | POA: Diagnosis not present

## 2011-07-14 DIAGNOSIS — E119 Type 2 diabetes mellitus without complications: Secondary | ICD-10-CM | POA: Diagnosis not present

## 2011-07-14 DIAGNOSIS — IMO0002 Reserved for concepts with insufficient information to code with codable children: Secondary | ICD-10-CM | POA: Diagnosis not present

## 2011-10-18 DIAGNOSIS — H60399 Other infective otitis externa, unspecified ear: Secondary | ICD-10-CM | POA: Diagnosis not present

## 2011-10-18 DIAGNOSIS — H6120 Impacted cerumen, unspecified ear: Secondary | ICD-10-CM | POA: Diagnosis not present

## 2011-10-24 ENCOUNTER — Ambulatory Visit (INDEPENDENT_AMBULATORY_CARE_PROVIDER_SITE_OTHER): Payer: 59 | Admitting: Ophthalmology

## 2011-10-24 DIAGNOSIS — E11319 Type 2 diabetes mellitus with unspecified diabetic retinopathy without macular edema: Secondary | ICD-10-CM

## 2011-10-24 DIAGNOSIS — H47219 Primary optic atrophy, unspecified eye: Secondary | ICD-10-CM

## 2011-10-24 DIAGNOSIS — H26499 Other secondary cataract, unspecified eye: Secondary | ICD-10-CM

## 2011-10-24 DIAGNOSIS — H33309 Unspecified retinal break, unspecified eye: Secondary | ICD-10-CM

## 2011-10-24 DIAGNOSIS — E1139 Type 2 diabetes mellitus with other diabetic ophthalmic complication: Secondary | ICD-10-CM

## 2011-10-24 DIAGNOSIS — H43819 Vitreous degeneration, unspecified eye: Secondary | ICD-10-CM

## 2011-10-31 ENCOUNTER — Ambulatory Visit (INDEPENDENT_AMBULATORY_CARE_PROVIDER_SITE_OTHER): Payer: 59 | Admitting: Ophthalmology

## 2011-10-31 DIAGNOSIS — H26499 Other secondary cataract, unspecified eye: Secondary | ICD-10-CM | POA: Diagnosis not present

## 2011-10-31 DIAGNOSIS — I10 Essential (primary) hypertension: Secondary | ICD-10-CM | POA: Diagnosis not present

## 2011-10-31 DIAGNOSIS — H35039 Hypertensive retinopathy, unspecified eye: Secondary | ICD-10-CM

## 2011-10-31 DIAGNOSIS — E11319 Type 2 diabetes mellitus with unspecified diabetic retinopathy without macular edema: Secondary | ICD-10-CM | POA: Diagnosis not present

## 2011-10-31 DIAGNOSIS — E1139 Type 2 diabetes mellitus with other diabetic ophthalmic complication: Secondary | ICD-10-CM

## 2011-11-02 ENCOUNTER — Ambulatory Visit (INDEPENDENT_AMBULATORY_CARE_PROVIDER_SITE_OTHER): Payer: 59 | Admitting: Ophthalmology

## 2011-11-17 ENCOUNTER — Ambulatory Visit (INDEPENDENT_AMBULATORY_CARE_PROVIDER_SITE_OTHER): Payer: 59 | Admitting: Ophthalmology

## 2011-11-17 DIAGNOSIS — H2700 Aphakia, unspecified eye: Secondary | ICD-10-CM

## 2011-11-21 DIAGNOSIS — H04129 Dry eye syndrome of unspecified lacrimal gland: Secondary | ICD-10-CM | POA: Diagnosis not present

## 2011-12-19 DIAGNOSIS — H0019 Chalazion unspecified eye, unspecified eyelid: Secondary | ICD-10-CM | POA: Diagnosis not present

## 2011-12-19 DIAGNOSIS — S058X9A Other injuries of unspecified eye and orbit, initial encounter: Secondary | ICD-10-CM | POA: Diagnosis not present

## 2012-01-02 DIAGNOSIS — Z961 Presence of intraocular lens: Secondary | ICD-10-CM | POA: Diagnosis not present

## 2012-01-02 DIAGNOSIS — S058X9A Other injuries of unspecified eye and orbit, initial encounter: Secondary | ICD-10-CM | POA: Diagnosis not present

## 2012-01-02 DIAGNOSIS — H16319 Corneal abscess, unspecified eye: Secondary | ICD-10-CM | POA: Diagnosis not present

## 2012-03-13 ENCOUNTER — Other Ambulatory Visit: Payer: Self-pay | Admitting: Internal Medicine

## 2012-03-20 DIAGNOSIS — H40059 Ocular hypertension, unspecified eye: Secondary | ICD-10-CM | POA: Diagnosis not present

## 2012-03-22 DIAGNOSIS — S058X9A Other injuries of unspecified eye and orbit, initial encounter: Secondary | ICD-10-CM | POA: Diagnosis not present

## 2012-03-22 DIAGNOSIS — H4011X0 Primary open-angle glaucoma, stage unspecified: Secondary | ICD-10-CM | POA: Diagnosis not present

## 2012-04-05 DIAGNOSIS — Z23 Encounter for immunization: Secondary | ICD-10-CM | POA: Diagnosis not present

## 2012-04-06 DIAGNOSIS — H1840 Unspecified corneal degeneration: Secondary | ICD-10-CM | POA: Diagnosis not present

## 2012-04-18 ENCOUNTER — Other Ambulatory Visit: Payer: Self-pay | Admitting: Internal Medicine

## 2012-05-12 ENCOUNTER — Other Ambulatory Visit: Payer: Self-pay | Admitting: Internal Medicine

## 2012-05-17 ENCOUNTER — Telehealth: Payer: Self-pay | Admitting: Internal Medicine

## 2012-05-17 MED ORDER — METFORMIN HCL 500 MG PO TABS: 500.0000 mg | ORAL_TABLET | Freq: Every day | ORAL | Status: DC

## 2012-05-17 NOTE — Telephone Encounter (Signed)
Refill done per pt. Request.

## 2012-05-17 NOTE — Telephone Encounter (Signed)
Caller: Munirah/; Phone: [phone]; Reason for Call: Patient is calling for a refill of Metformin.  Pharmacy states that it can't be refilled without patient being seen.  She has had surgery making transportation an issue.  Connected with scheduling with appointment made for 12/16/ 14: 30 with Dr.  Jonny Ruiz.  Last seen 02/18/2011.  Drug store of choice is [facility] at [facility] at [phone]

## 2012-05-28 ENCOUNTER — Other Ambulatory Visit: Payer: Self-pay | Admitting: Internal Medicine

## 2012-05-28 ENCOUNTER — Ambulatory Visit (INDEPENDENT_AMBULATORY_CARE_PROVIDER_SITE_OTHER): Payer: 59 | Admitting: Internal Medicine

## 2012-05-28 ENCOUNTER — Other Ambulatory Visit (INDEPENDENT_AMBULATORY_CARE_PROVIDER_SITE_OTHER): Payer: 59

## 2012-05-28 ENCOUNTER — Encounter: Payer: Self-pay | Admitting: Internal Medicine

## 2012-05-28 VITALS — BP 138/72 | HR 82 | Temp 97.3°F | Ht 64.0 in | Wt 223.0 lb

## 2012-05-28 DIAGNOSIS — E119 Type 2 diabetes mellitus without complications: Secondary | ICD-10-CM

## 2012-05-28 DIAGNOSIS — M169 Osteoarthritis of hip, unspecified: Secondary | ICD-10-CM | POA: Insufficient documentation

## 2012-05-28 DIAGNOSIS — Z0000 Encounter for general adult medical examination without abnormal findings: Secondary | ICD-10-CM

## 2012-05-28 LAB — URINALYSIS, ROUTINE W REFLEX MICROSCOPIC
Bilirubin Urine: NEGATIVE
Hgb urine dipstick: NEGATIVE
Ketones, ur: NEGATIVE
Leukocytes, UA: NEGATIVE
Nitrite: NEGATIVE
Specific Gravity, Urine: 1.025 (ref 1.000–1.030)
Total Protein, Urine: NEGATIVE
Urine Glucose: NEGATIVE
Urobilinogen, UA: 0.2 (ref 0.0–1.0)
pH: 5 (ref 5.0–8.0)

## 2012-05-28 LAB — CBC WITH DIFFERENTIAL/PLATELET
Basophils Absolute: 0 10*3/uL (ref 0.0–0.1)
Basophils Relative: 0.5 % (ref 0.0–3.0)
Eosinophils Absolute: 0.2 10*3/uL (ref 0.0–0.7)
Eosinophils Relative: 2.1 % (ref 0.0–5.0)
HCT: 36 % (ref 36.0–46.0)
Hemoglobin: 12.3 g/dL (ref 12.0–15.0)
Lymphocytes Relative: 32.3 % (ref 12.0–46.0)
Lymphs Abs: 2.4 10*3/uL (ref 0.7–4.0)
MCHC: 34.3 g/dL (ref 30.0–36.0)
MCV: 81.6 fl (ref 78.0–100.0)
Monocytes Absolute: 0.5 10*3/uL (ref 0.1–1.0)
Monocytes Relative: 6.8 % (ref 3.0–12.0)
Neutro Abs: 4.4 10*3/uL (ref 1.4–7.7)
Neutrophils Relative %: 58.3 % (ref 43.0–77.0)
Platelets: 231 10*3/uL (ref 150.0–400.0)
RBC: 4.41 Mil/uL (ref 3.87–5.11)
RDW: 14.5 % (ref 11.5–14.6)
WBC: 7.5 10*3/uL (ref 4.5–10.5)

## 2012-05-28 LAB — HEPATIC FUNCTION PANEL
ALT: 22 U/L (ref 0–35)
AST: 17 U/L (ref 0–37)
Albumin: 4 g/dL (ref 3.5–5.2)
Alkaline Phosphatase: 55 U/L (ref 39–117)
Bilirubin, Direct: 0.1 mg/dL (ref 0.0–0.3)
Total Bilirubin: 0.6 mg/dL (ref 0.3–1.2)
Total Protein: 7 g/dL (ref 6.0–8.3)

## 2012-05-28 LAB — BASIC METABOLIC PANEL
BUN: 12 mg/dL (ref 6–23)
CO2: 26 mEq/L (ref 19–32)
Calcium: 8.9 mg/dL (ref 8.4–10.5)
Chloride: 102 mEq/L (ref 96–112)
Creatinine, Ser: 0.8 mg/dL (ref 0.4–1.2)
GFR: 78.41 mL/min (ref >60.00)
Glucose, Bld: 199 mg/dL — ABNORMAL HIGH (ref 70–99)
Potassium: 4.1 mEq/L (ref 3.5–5.1)
Sodium: 138 mEq/L (ref 135–145)

## 2012-05-28 LAB — LIPID PANEL
Cholesterol: 180 mg/dL (ref 0–200)
HDL: 43.9 mg/dL (ref >39.00)
LDL Cholesterol: 108 mg/dL — ABNORMAL HIGH (ref 0–99)
Total CHOL/HDL Ratio: 4
Triglycerides: 142 mg/dL (ref 0.0–149.0)
VLDL: 28.4 mg/dL (ref 0.0–40.0)

## 2012-05-28 LAB — HEMOGLOBIN A1C: Hgb A1c MFr Bld: 8.5 % — ABNORMAL HIGH (ref 4.6–6.5)

## 2012-05-28 LAB — MICROALBUMIN / CREATININE URINE RATIO
Creatinine,U: 55.9 mg/dL
Microalb Creat Ratio: 0.9 mg/g (ref 0.0–30.0)
Microalb, Ur: 0.5 mg/dL (ref 0.0–1.9)

## 2012-05-28 LAB — TSH: TSH: 0.64 u[IU]/mL (ref 0.35–5.50)

## 2012-05-28 MED ORDER — LISINOPRIL-HYDROCHLOROTHIAZIDE 20-12.5 MG PO TABS: 1.0000 | ORAL_TABLET | Freq: Every day | ORAL | Status: DC

## 2012-05-28 MED ORDER — METFORMIN HCL ER 500 MG PO TB24: ORAL_TABLET | ORAL | Status: DC

## 2012-05-28 MED ORDER — METFORMIN HCL 500 MG PO TABS: 500.0000 mg | ORAL_TABLET | Freq: Every day | ORAL | Status: DC

## 2012-05-28 NOTE — Patient Instructions (Addendum)
Continue all other medications as before Your refills were done as requested today Please have the pharmacy call with any other refills you may need. Please continue your efforts at being more active, low cholesterol diet, and weight control. Please keep your appointments with your specialists as you have planned - orthopedic for the left hip Please go to LAB in the Basement for the blood and/or urine tests to be done today You will be contacted by phone if any changes need to be made immediately.  Otherwise, you will receive a letter about your results with an explanation, but please check with MyChart first. Thank you for enrolling in MyChart. Please follow the instructions below to securely access your online medical record. MyChart allows you to send messages to your doctor, view your test results, renew your prescriptions, schedule appointments, and more. To Log into MyChart, please go to [email or web address], and your Username is: [email or web address] Please return in 6 mo with Lab testing done 3-5 days before

## 2012-05-28 NOTE — Progress Notes (Signed)
Subjective:    Patient ID: [name], female    DOB: 1946-03-12, 66 y.o.   MRN: [id number]  HPI  Here for wellness and f/u;  Overall doing ok;  Pt denies CP, worsening SOB, DOE, wheezing, orthopnea, PND, worsening LE edema, palpitations, dizziness or syncope.  Pt denies neurological change such as new Headache, facial or extremity weakness.  Pt denies polydipsia, polyuria, or low sugar symptoms. Pt states overall good compliance with treatment and medications, good tolerability, and trying to follow lower cholesterol diet.  Pt denies worsening depressive symptoms, suicidal ideation or panic. No fever,  night sweats, loss of appetite, or other constitutional symptoms.  Pt states good ability with ADL's, low fall risk, home safety reviewed and adequate, no significant changes in hearing or vision, and occasionally active with exercise.  Has been able to lose wt from 238 to 233.  Past Medical History  Diagnosis Date  . DIABETES MELLITUS, TYPE II 01/04/2007  . HYPERLIPIDEMIA 01/04/2007  . Overweight 01/04/2007  . ANXIETY 01/04/2007  . GLAUCOMA 07/30/2008  . OTITIS MEDIA, ACUTE, BILATERAL 02/29/2008  . OSTEOARTHRITIS, HIP 09/25/2009  . LEG PAIN, LEFT 07/06/2007  . Dizziness and giddiness 02/29/2008  . NUMBNESS 07/30/2008  . TRANSIENT ISCHEMIC ATTACK, HX OF 01/04/2007  . DVT, HX OF 07/06/2007  . PONV (postoperative nausea and vomiting)   . SLEEP APNEA, OBSTRUCTIVE 01/04/2007    history was on a CPAP but has resolved with weight loss  . HYPERTENSION 01/04/2007    does not see a cardiologist  . GERD 01/04/2007    pt reports resolved with weight loss   Past Surgical History  Procedure Date  . Cholecystectomy   . Abdominal hysterectomy 1999  . Oophorectomy   . Eye surgery     shunt left and lazer eye surgery on right cataract and retenia tear with repair  . Knee arthroscopy   . Growth removal     from thumb  . Total hip arthroplasty 06/24/2011    Procedure: TOTAL HIP ARTHROPLASTY;  Surgeon: Nestor Lewandowsky;  Location: [facility] OR;  Service: Orthopedics;  Laterality: Right;    reports that she has never smoked. She does not have any smokeless tobacco history on file. She reports that she does not drink alcohol or use illicit drugs. family history includes Cancer in her mother; Dementia in her mother; and Stroke in her sister.  There is no history of Anesthesia problems. Allergies  Allergen Reactions  . Lipitor (Atorvastatin Calcium)     Leg cramp  . Sitagliptin Phosphate Nausea And Vomiting  . Sulfa Drugs Cross Reactors Nausea And Vomiting   Current Outpatient Prescriptions on File Prior to Visit  Medication Sig Dispense Refill  . bimatoprost (LUMIGAN) 0.01 % SOLN Place 1 drop into the right eye at bedtime.  8 mL  2  . Brimonidine Tartrate-Timolol (COMBIGAN OP) Place 1 drop into the right eye 2 (two) times daily.       Marland Kitchen lisinopril-hydrochlorothiazide (PRINZIDE,ZESTORETIC) 20-12.5 MG per tablet Take 1 tablet by mouth daily.  90 tablet  3  . warfarin (COUMADIN) 5 MG tablet Take as directed by HHN to maintain INR 1.2-2.0  30 tablet  0   Review of Systems Review of Systems  Constitutional: Negative for diaphoresis, activity change, appetite change and unexpected weight change.  HENT: Negative for hearing loss, ear pain, facial swelling, mouth sores and neck stiffness.   Eyes: Negative for pain, redness and visual disturbance.  Respiratory: Negative for shortness of breath and  wheezing.   Cardiovascular: Negative for chest pain and palpitations.  Gastrointestinal: Negative for diarrhea, blood in stool, abdominal distention and rectal pain.  Genitourinary: Negative for hematuria, flank pain and decreased urine volume.  Musculoskeletal: Negative for myalgias and joint swelling.  Skin: Negative for color change and wound.  Neurological: Negative for syncope and numbness.  Hematological: Negative for adenopathy.  Psychiatric/Behavioral: Negative for hallucinations, self-injury, decreased  concentration and agitation.      Objective:   Physical Exam BP 138/72  Pulse 82  Temp 97.3 F (36.3 C) (Oral)  Ht 5\' 4"  (1.626 m)  Wt 223 lb (101.152 kg)  BMI 38.28 kg/m2  SpO2 96% Physical Exam  VS noted Constitutional: Pt is oriented to person, place, and time. Appears well-developed and well-nourished.  HENT:  Head: Normocephalic and atraumatic.  Right Ear: External ear normal.  Left Ear: External ear normal.  Nose: Nose normal.  Mouth/Throat: Oropharynx is clear and moist.  Eyes: Conjunctivae and EOM are normal. Pupils are equal, round, and reactive to light.  Neck: Normal range of motion. Neck supple. No JVD present. No tracheal deviation present.  Cardiovascular: Normal rate, regular rhythm, normal heart sounds and intact distal pulses.   Pulmonary/Chest: Effort normal and breath sounds normal.  Abdominal: Soft. Bowel sounds are normal. There is no tenderness.  Musculoskeletal: Normal range of motion. Exhibits no edema.  Lymphadenopathy:  Has no cervical adenopathy.  Neurological: Pt is alert and oriented to person, place, and time. Pt has normal reflexes. No cranial nerve deficit.  Skin: Skin is warm and dry. No rash noted.  Psychiatric:  Has  normal mood and affect. Behavior is normal.     Assessment & Plan:

## 2012-06-02 ENCOUNTER — Encounter: Payer: Self-pay | Admitting: Internal Medicine

## 2012-06-02 NOTE — Assessment & Plan Note (Addendum)

## 2012-06-02 NOTE — Assessment & Plan Note (Signed)
stable overall by hx and exam, most recent data reviewed with pt, and pt to continue medical treatment as before Lab Results  Component Value Date   HGBA1C 8.5* 05/28/2012

## 2012-06-14 ENCOUNTER — Encounter: Payer: Self-pay | Admitting: Internal Medicine

## 2012-06-14 ENCOUNTER — Ambulatory Visit (INDEPENDENT_AMBULATORY_CARE_PROVIDER_SITE_OTHER): Payer: 59 | Admitting: Internal Medicine

## 2012-06-14 ENCOUNTER — Telehealth: Payer: Self-pay | Admitting: Internal Medicine

## 2012-06-14 VITALS — BP 130/70 | HR 57 | Temp 98.0°F | Ht 64.0 in | Wt 221.0 lb

## 2012-06-14 DIAGNOSIS — E119 Type 2 diabetes mellitus without complications: Secondary | ICD-10-CM | POA: Diagnosis not present

## 2012-06-14 DIAGNOSIS — I10 Essential (primary) hypertension: Secondary | ICD-10-CM | POA: Diagnosis not present

## 2012-06-14 DIAGNOSIS — E785 Hyperlipidemia, unspecified: Secondary | ICD-10-CM | POA: Diagnosis not present

## 2012-06-14 MED ORDER — PRAVASTATIN SODIUM 20 MG PO TABS: 20.0000 mg | ORAL_TABLET | Freq: Every day | ORAL | Status: DC

## 2012-06-14 MED ORDER — ASPIRIN 81 MG PO TBEC: 81.0000 mg | DELAYED_RELEASE_TABLET | Freq: Every day | ORAL | Status: DC

## 2012-06-14 MED ORDER — PIOGLITAZONE HCL-METFORMIN HCL 15-500 MG PO TABS: 1.0000 | ORAL_TABLET | Freq: Two times a day (BID) | ORAL | Status: DC

## 2012-06-14 NOTE — Assessment & Plan Note (Signed)
stable overall by hx and exam, most recent data reviewed with pt, and pt to continue medical treatment as before Lab Results  Component Value Date   HGBA1C 8.5* 05/28/2012   To change back to actosplumet, though will need BID dosing this time

## 2012-06-14 NOTE — Assessment & Plan Note (Addendum)
stable overall by hx and exam, most recent data reviewed with pt, and pt to start pravastatin with goal ldl < 70 Lab Results  Component Value Date   LDLCALC 108* 05/28/2012

## 2012-06-14 NOTE — Progress Notes (Addendum)
Subjective:    Patient ID: [name], female    DOB: 03-11-1946, 67 y.o.   MRN: [id number]  HPI  Here to f/u; overall doing ok,  Pt denies chest pain, increased sob or doe, wheezing, orthopnea, PND, increased LE swelling, palpitations, dizziness or syncope.  Pt denies new neurological symptoms such as new headache, or facial or extremity weakness or numbness   Pt denies polydipsia, polyuria, or low sugar symptoms such as weakness or confusion improved with po intake.  Pt states overall good compliance with meds, trying to follow lower cholesterol, diabetic diet, wt overall stable but little exercise however. CBG's not improved as well as with actosplusmet she had before, and wants to change med.  CBG's still in 200's.  Does also have right foot recurrent cramping at night, better with tonic water Past Medical History  Diagnosis Date  . DIABETES MELLITUS, TYPE II 01/04/2007  . HYPERLIPIDEMIA 01/04/2007  . Overweight 01/04/2007  . ANXIETY 01/04/2007  . GLAUCOMA 07/30/2008  . OTITIS MEDIA, ACUTE, BILATERAL 02/29/2008  . OSTEOARTHRITIS, HIP 09/25/2009  . LEG PAIN, LEFT 07/06/2007  . Dizziness and giddiness 02/29/2008  . NUMBNESS 07/30/2008  . TRANSIENT ISCHEMIC ATTACK, HX OF 01/04/2007  . DVT, HX OF 07/06/2007  . PONV (postoperative nausea and vomiting)   . SLEEP APNEA, OBSTRUCTIVE 01/04/2007    history was on a CPAP but has resolved with weight loss  . HYPERTENSION 01/04/2007    does not see a cardiologist  . GERD 01/04/2007    pt reports resolved with weight loss   Past Surgical History  Procedure Date  . Cholecystectomy   . Abdominal hysterectomy 1999  . Oophorectomy   . Eye surgery     shunt left and lazer eye surgery on right cataract and retenia tear with repair  . Knee arthroscopy   . Growth removal     from thumb  . Total hip arthroplasty 06/24/2011    Procedure: TOTAL HIP ARTHROPLASTY;  Surgeon: Nestor Lewandowsky;  Location: [facility] OR;  Service: Orthopedics;  Laterality: Right;    reports  that she has never smoked. She does not have any smokeless tobacco history on file. She reports that she does not drink alcohol or use illicit drugs. family history includes Cancer in her mother; Dementia in her mother; and Stroke in her sister.  There is no history of Anesthesia problems. Allergies  Allergen Reactions  . Lipitor (Atorvastatin Calcium)     Leg cramp  . Sitagliptin Phosphate Nausea And Vomiting  . Sulfa Drugs Cross Reactors Nausea And Vomiting   Current Outpatient Prescriptions on File Prior to Visit  Medication Sig Dispense Refill  . Brimonidine Tartrate-Timolol (COMBIGAN OP) Place 1 drop into the right eye 2 (two) times daily.       Marland Kitchen lisinopril-hydrochlorothiazide (PRINZIDE,ZESTORETIC) 20-12.5 MG per tablet Take 1 tablet by mouth daily.  90 tablet  3  . pioglitazone-metformin (ACTOPLUS MET) 15-500 MG per tablet Take 1 tablet by mouth 2 (two) times daily.  180 tablet  3  . pravastatin (PRAVACHOL) 20 MG tablet Take 1 tablet (20 mg total) by mouth daily.  90 tablet  3   Review of Systems  Constitutional: Negative for diaphoresis and unexpected weight change.  HENT: Negative for tinnitus.   Eyes: Negative for photophobia and visual disturbance.  Respiratory: Negative for choking and stridor.   Gastrointestinal: Negative for vomiting and blood in stool.  Genitourinary: Negative for hematuria and decreased urine volume.  Musculoskeletal: Negative for gait problem.  Skin: Negative for color change and wound.  Neurological: Negative for tremors and numbness.  Psychiatric/Behavioral: Negative for decreased concentration. The patient is not hyperactive.       Objective:   Physical Exam BP 130/70  Pulse 57  Temp 98 F (36.7 C) (Oral)  Ht 5\' 4"  (1.626 m)  Wt 221 lb (100.245 kg)  BMI 37.93 kg/m2  SpO2 97% Physical Exam  VS noted Constitutional: Pt appears well-developed and well-nourished.  HENT: Head: Normocephalic.  Right Ear: External ear normal.  Left Ear:  External ear normal.  Eyes: Conjunctivae and EOM are normal. Pupils are equal, round, and reactive to light.  Neck: Normal range of motion. Neck supple.  Cardiovascular: Normal rate and regular rhythm.   Pulmonary/Chest: Effort normal and breath sounds normal.  Neurological: Pt is alert. Not confused  Skin: Skin is warm. No erythema.  Psychiatric: Pt behavior is normal. Thought content normal.     Assessment & Plan:  Quality Measures addressed:  Mammogram:  pt declines and will self-refer Colorectal Cancer screening: pt declines all at this time, including FOBT, flex sig, colonoscopy Pneumonia Vaccine: pt declines, may self-refer to local pharmacy  Diabetes LDL < 100: pt declines further medication Diabetes Hgba1c < 8%: pt declines further medication

## 2012-06-14 NOTE — Telephone Encounter (Signed)
Patient Information:  Caller Name: Donnia  Phone: [phone]  Patient: [name], [name]  Gender: Female  DOB: November 25, 1945  Age: 67 Years  PCP: Oliver Barre (Adults only)  Office Follow Up:  Does the office need to follow up with this patient?: Yes  Instructions For The Office: Scheduled patient and appt for medication review. Does Dr. Jonny Ruiz want to see or just change medication.  Appt scheduled today at 10:30 with Dr. Jonny Ruiz   Symptoms  Reason For Call & Symptoms: Patient states her recent medication changes has caused a rise in her Blood sugar.  She was originally on Metformin 500mg  once a day  and she was switched  Metformin ER 500mg  tid.  She states yesterday she was very sleepy and her blood sugar #269. Feel Fuzzy per patient  Reviewed Health History In EMR: Yes  Reviewed Medications In EMR: Yes  Reviewed Allergies In EMR: Yes  Reviewed Surgeries / Procedures: Yes  Date of Onset of Symptoms: 05/28/2012  Guideline(s) Used:  Diabetes - High Blood Sugar  Disposition Per Guideline:   See Today in Office  Reason For Disposition Reached:   Patient wants to be seen  Advice Given:  General  Definition of hyperglycemia: - Fasting blood glucose more than 140 mg/dL (7.5 mmol/l) or random blood glucose more than 200 mg/dL (11 mmol/l).  Symptoms of mild hyperglycemia: frequent urination, increased thirst, fatigue, blurred vision.  Symptoms of severe hyperglycemia: weakness, progressing to confusion and coma.  Treatment - Liquids  Drink at least one glass (8 oz or 240 ml) of water per hour for the next 4 hours. (Reason: adequate hydration will reduce hyperglycemia).  Generally, you should try to drink 6-8 glasses of water each day.  Treatment - Diabetes Medications  : Continue taking your diabetes pills.  Measure and Record Your Blood Glucose  Every day you should measure your blood glucose before breakfast and before going to bed.  Record the results and show them to your doctor at your  next office visit.  Call Back If:  Blood glucose more than 300 mg/dL (09.8 mmol/l), 2 or more times in a row.  Urine ketones become moderate or large  Vomiting lasting more than 4 hours or unable to drink any liquids.  Rapid breathing occurs  You become worse.  Appointment Scheduled:  06/14/2012 10:30:00 Appointment Scheduled Provider:  Oliver Barre (Adults only)

## 2012-06-14 NOTE — Patient Instructions (Addendum)
Ok to stop the metformin ER Take all new medications as prescribed - the actosplus met, and pravastatin Continue all other medications as before Please have the pharmacy call with any other refills you may need. Please continue your efforts at being more active, low cholesterol diet, and weight control, as you can Please keep your appointments with your specialists as you have planned - orthopedic for your left hip arthritis as you need Please call if you wish to try the muscle relaxer for the right foot cramp if the tonic water does not work well enough

## 2012-06-14 NOTE — Assessment & Plan Note (Signed)
stable overall by hx and exam, most recent data reviewed with pt, and pt to continue medical treatment as before BP Readings from Last 3 Encounters:  06/14/12 130/70  05/28/12 138/72  06/27/11 124/82

## 2012-06-18 ENCOUNTER — Telehealth: Payer: Self-pay

## 2012-06-18 MED ORDER — GLUCOSE BLOOD VI STRP: ORAL_STRIP | Status: DC

## 2012-06-18 NOTE — Telephone Encounter (Signed)
Ok for once daily, usually in the AM, but should also take before dinner later in the day sometimes as well to see if higher later in the day

## 2012-06-18 NOTE — Telephone Encounter (Signed)
The patient is currently checking her BS in the am and pm as MD never advised when she should check.  If different please advise and the patient does need a new prescription for One touch ultra test strips.

## 2012-06-18 NOTE — Telephone Encounter (Signed)
Called the patient informed of MD instructions. 

## 2012-07-02 DIAGNOSIS — H40009 Preglaucoma, unspecified, unspecified eye: Secondary | ICD-10-CM | POA: Diagnosis not present

## 2012-07-24 DIAGNOSIS — H4011X0 Primary open-angle glaucoma, stage unspecified: Secondary | ICD-10-CM | POA: Diagnosis not present

## 2012-07-24 DIAGNOSIS — Z961 Presence of intraocular lens: Secondary | ICD-10-CM | POA: Diagnosis not present

## 2012-07-24 DIAGNOSIS — H409 Unspecified glaucoma: Secondary | ICD-10-CM | POA: Diagnosis not present

## 2012-08-06 DIAGNOSIS — E119 Type 2 diabetes mellitus without complications: Secondary | ICD-10-CM | POA: Diagnosis not present

## 2012-08-06 DIAGNOSIS — I10 Essential (primary) hypertension: Secondary | ICD-10-CM | POA: Diagnosis not present

## 2012-08-06 DIAGNOSIS — E7800 Pure hypercholesterolemia, unspecified: Secondary | ICD-10-CM | POA: Diagnosis not present

## 2012-08-16 DIAGNOSIS — H1589 Other disorders of sclera: Secondary | ICD-10-CM | POA: Diagnosis not present

## 2012-08-16 DIAGNOSIS — H4011X0 Primary open-angle glaucoma, stage unspecified: Secondary | ICD-10-CM | POA: Diagnosis not present

## 2012-08-16 DIAGNOSIS — Z7982 Long term (current) use of aspirin: Secondary | ICD-10-CM | POA: Diagnosis not present

## 2012-08-16 DIAGNOSIS — M1990 Unspecified osteoarthritis, unspecified site: Secondary | ICD-10-CM | POA: Diagnosis not present

## 2012-08-16 DIAGNOSIS — E669 Obesity, unspecified: Secondary | ICD-10-CM | POA: Diagnosis not present

## 2012-08-16 DIAGNOSIS — Z86718 Personal history of other venous thrombosis and embolism: Secondary | ICD-10-CM | POA: Diagnosis not present

## 2012-08-16 DIAGNOSIS — E7800 Pure hypercholesterolemia, unspecified: Secondary | ICD-10-CM | POA: Diagnosis not present

## 2012-08-16 DIAGNOSIS — I10 Essential (primary) hypertension: Secondary | ICD-10-CM | POA: Diagnosis not present

## 2012-08-16 DIAGNOSIS — Z961 Presence of intraocular lens: Secondary | ICD-10-CM | POA: Diagnosis not present

## 2012-08-16 DIAGNOSIS — H409 Unspecified glaucoma: Secondary | ICD-10-CM | POA: Diagnosis not present

## 2012-08-16 DIAGNOSIS — E119 Type 2 diabetes mellitus without complications: Secondary | ICD-10-CM | POA: Diagnosis not present

## 2012-08-16 DIAGNOSIS — Z8673 Personal history of transient ischemic attack (TIA), and cerebral infarction without residual deficits: Secondary | ICD-10-CM | POA: Diagnosis not present

## 2012-08-17 DIAGNOSIS — H4011X0 Primary open-angle glaucoma, stage unspecified: Secondary | ICD-10-CM | POA: Diagnosis not present

## 2012-08-17 DIAGNOSIS — H409 Unspecified glaucoma: Secondary | ICD-10-CM | POA: Diagnosis not present

## 2012-08-17 DIAGNOSIS — H1589 Other disorders of sclera: Secondary | ICD-10-CM | POA: Diagnosis not present

## 2012-09-18 DIAGNOSIS — M169 Osteoarthritis of hip, unspecified: Secondary | ICD-10-CM | POA: Diagnosis not present

## 2012-10-26 ENCOUNTER — Encounter (HOSPITAL_COMMUNITY): Payer: Self-pay | Admitting: Pharmacy Technician

## 2012-11-06 ENCOUNTER — Other Ambulatory Visit: Payer: Self-pay | Admitting: Orthopedic Surgery

## 2012-11-08 ENCOUNTER — Encounter (HOSPITAL_COMMUNITY)
Admission: RE | Admit: 2012-11-08 | Discharge: 2012-11-08 | Disposition: A | Discharge status: Home / Self Care | Payer: 59 | Source: Ambulatory Visit | Attending: Orthopedic Surgery | Admitting: Orthopedic Surgery

## 2012-11-08 ENCOUNTER — Encounter (HOSPITAL_COMMUNITY): Payer: Self-pay

## 2012-11-08 ENCOUNTER — Ambulatory Visit (HOSPITAL_COMMUNITY)
Admission: RE | Admit: 2012-11-08 | Discharge: 2012-11-08 | Disposition: A | Discharge status: Home / Self Care | Payer: 59 | Source: Ambulatory Visit | Attending: Orthopedic Surgery | Admitting: Orthopedic Surgery

## 2012-11-08 DIAGNOSIS — Z01812 Encounter for preprocedural laboratory examination: Secondary | ICD-10-CM | POA: Insufficient documentation

## 2012-11-08 DIAGNOSIS — Z01810 Encounter for preprocedural cardiovascular examination: Secondary | ICD-10-CM | POA: Insufficient documentation

## 2012-11-08 DIAGNOSIS — M169 Osteoarthritis of hip, unspecified: Secondary | ICD-10-CM | POA: Diagnosis not present

## 2012-11-08 DIAGNOSIS — E119 Type 2 diabetes mellitus without complications: Secondary | ICD-10-CM | POA: Diagnosis not present

## 2012-11-08 DIAGNOSIS — Z8673 Personal history of transient ischemic attack (TIA), and cerebral infarction without residual deficits: Secondary | ICD-10-CM | POA: Diagnosis not present

## 2012-11-08 DIAGNOSIS — I10 Essential (primary) hypertension: Secondary | ICD-10-CM | POA: Insufficient documentation

## 2012-11-08 DIAGNOSIS — M1610 Unilateral primary osteoarthritis, unspecified hip: Secondary | ICD-10-CM | POA: Insufficient documentation

## 2012-11-08 HISTORY — DX: Effusion, unspecified joint: M25.40

## 2012-11-08 HISTORY — DX: Pain in unspecified joint: M25.50

## 2012-11-08 HISTORY — DX: Unspecified visual loss: H54.7

## 2012-11-08 HISTORY — DX: Personal history of other medical treatment: Z92.89

## 2012-11-08 LAB — BASIC METABOLIC PANEL
BUN: 17 mg/dL (ref 6–23)
CO2: 22 mEq/L (ref 19–32)
Calcium: 9.2 mg/dL (ref 8.4–10.5)
Chloride: 106 mEq/L (ref 96–112)
Creatinine, Ser: 0.78 mg/dL (ref 0.50–1.10)
GFR calc Af Amer: >90 mL/min (ref >90)
GFR calc non Af Amer: 85 mL/min — ABNORMAL LOW (ref >90)
Glucose, Bld: 113 mg/dL — ABNORMAL HIGH (ref 70–99)
Potassium: 3.7 mEq/L (ref 3.5–5.1)
Sodium: 141 mEq/L (ref 135–145)

## 2012-11-08 LAB — CBC WITH DIFFERENTIAL/PLATELET
Basophils Absolute: 0 10*3/uL (ref 0.0–0.1)
Basophils Relative: 0 % (ref 0–1)
Eosinophils Absolute: 0.2 10*3/uL (ref 0.0–0.7)
Eosinophils Relative: 3 % (ref 0–5)
HCT: 36.4 % (ref 36.0–46.0)
Hemoglobin: 12.1 g/dL (ref 12.0–15.0)
Lymphocytes Relative: 33 % (ref 12–46)
Lymphs Abs: 2.1 10*3/uL (ref 0.7–4.0)
MCH: 27.8 pg (ref 26.0–34.0)
MCHC: 33.2 g/dL (ref 30.0–36.0)
MCV: 83.5 fL (ref 78.0–100.0)
Monocytes Absolute: 0.4 10*3/uL (ref 0.1–1.0)
Monocytes Relative: 7 % (ref 3–12)
Neutro Abs: 3.5 10*3/uL (ref 1.7–7.7)
Neutrophils Relative %: 57 % (ref 43–77)
Platelets: 218 10*3/uL (ref 150–400)
RBC: 4.36 MIL/uL (ref 3.87–5.11)
RDW: 14.2 % (ref 11.5–15.5)
WBC: 6.1 10*3/uL (ref 4.0–10.5)

## 2012-11-08 LAB — URINALYSIS, ROUTINE W REFLEX MICROSCOPIC
Bilirubin Urine: NEGATIVE
Glucose, UA: NEGATIVE mg/dL
Hgb urine dipstick: NEGATIVE
Ketones, ur: NEGATIVE mg/dL
Leukocytes, UA: NEGATIVE
Nitrite: NEGATIVE
Protein, ur: NEGATIVE mg/dL
Specific Gravity, Urine: 1.017 (ref 1.005–1.030)
Urobilinogen, UA: 0.2 mg/dL (ref 0.0–1.0)
pH: 6.5 (ref 5.0–8.0)

## 2012-11-08 LAB — APTT: aPTT: 35 seconds (ref 24–37)

## 2012-11-08 LAB — SURGICAL PCR SCREEN
MRSA, PCR: NEGATIVE
Staphylococcus aureus: NEGATIVE

## 2012-11-08 LAB — PROTIME-INR
INR: 1.06 (ref 0.00–1.49)
Prothrombin Time: 13.7 seconds (ref 11.6–15.2)

## 2012-11-08 MED ORDER — CHLORHEXIDINE GLUCONATE 4 % EX LIQD: 60.0000 mL | Freq: Once | CUTANEOUS | Status: DC

## 2012-11-08 NOTE — Pre-Procedure Instructions (Signed)
[name]  11/08/2012   Your procedure is scheduled on:  Mon, June 2 @ 12:30 PM  Report to [facility] at 10:30 AM.  Call this number if you have problems the morning of surgery: [phone]   Remember:   Do not eat food or drink liquids after midnight.   Take these medicines the morning of surgery with A SIP OF WATER: Eye Drops               No Aspirin,Goody's,BC's,Aleve,Ibuprofen,Fish Oil,or any Herbal Medications   Do not wear jewelry, make-up or nail polish.  Do not wear lotions, powders, or perfumes. You may wear deodorant.  Do not shave 48 hours prior to surgery.   Do not bring valuables to the hospital.  Contacts, dentures or bridgework may not be worn into surgery.  Leave suitcase in the car. After surgery it may be brought to your room.  For patients admitted to the hospital, checkout time is 11:00 AM the day of  discharge.   Patients discharged the day of surgery will not be allowed to drive  home.    Special Instructions: Shower using CHG 2 nights before surgery and the night before surgery.  If you shower the day of surgery use CHG.  Use special wash - you have one bottle of CHG for all showers.  You should use approximately 1/3 of the bottle for each shower.   Please read over the following fact sheets that you were given: Pain Booklet, Coughing and Deep Breathing, Blood Transfusion Information, Total Joint Packet, MRSA Information and Surgical Site Infection Prevention

## 2012-11-08 NOTE — Progress Notes (Signed)
Pt doesn't have a cardiologist  Stress test at least 43yrs ago  Echo epic from 2007  Denies ever having a heart cath  Dr.James Jonny Ruiz is MEdical MD  DEnies EKG or CXR in past yr

## 2012-11-08 NOTE — Progress Notes (Signed)
Very little vision in left eye

## 2012-11-09 NOTE — H&P (Signed)
TOTAL HIP ADMISSION H&P  Patient is admitted for left total hip arthroplasty.  Subjective:  Chief Complaint: left hip pain  HPI: [name], 67 y.o. female, has a history of pain and functional disability in the left hip(s) due to arthritis and patient has failed non-surgical conservative treatments for greater than 12 weeks to include NSAID's and/or analgesics and use of assistive devices.  Onset of symptoms was gradual starting 2 years ago with gradually worsening course since that time.The patient noted no past surgery on the left hip(s).  Patient currently rates pain in the left hip at 8 out of 10 with activity. Patient has worsening of pain with activity and weight bearing and pain that interfers with activities of daily living. Patient has evidence of subchondral cysts, periarticular osteophytes and joint space narrowing by imaging studies. This condition presents safety issues increasing the risk of falls. There is no current active infection.  Patient Active Problem List   Diagnosis Date Noted  . Degenerative arthritis of hip 05/28/2012  . Preventative health care 10/22/2010  . OSTEOARTHRITIS, HIP 09/25/2009  . GLAUCOMA 07/30/2008  . NUMBNESS 07/30/2008  . Dizziness and giddiness 02/29/2008  . LEG PAIN, LEFT 07/06/2007  . DVT, HX OF 07/06/2007  . DIABETES MELLITUS, TYPE II 01/04/2007  . HYPERLIPIDEMIA 01/04/2007  . Overweight 01/04/2007  . ANXIETY 01/04/2007  . SLEEP APNEA, OBSTRUCTIVE 01/04/2007  . HYPERTENSION 01/04/2007  . ASTHMA 01/04/2007  . GERD 01/04/2007  . TRANSIENT ISCHEMIC ATTACK, HX OF 01/04/2007   Past Medical History  Diagnosis Date  . HYPERLIPIDEMIA 01/04/2007    taking Pravastatin daily  . Overweight(278.02) 01/04/2007  . ANXIETY 01/04/2007  . GLAUCOMA 07/30/2008  . OTITIS MEDIA, ACUTE, BILATERAL 02/29/2008  . OSTEOARTHRITIS, HIP 09/25/2009  . LEG PAIN, LEFT 07/06/2007  . Dizziness and giddiness 02/29/2008  . NUMBNESS 07/30/2008    in fingers;pt states from  Diamox  . TRANSIENT ISCHEMIC ATTACK, HX OF 01/04/2007  . DVT, HX OF     at age 4 in right buttocks  . PONV (postoperative nausea and vomiting)   . GERD 01/04/2007    pt reports resolved with weight loss  . Vision loss     left eye  . History of blood transfusion     no abnormal  reaction  . HYPERTENSION 01/04/2007    takes Lisinopril daily  . Joint pain   . Joint swelling   . DIABETES MELLITUS, TYPE II 01/04/2007    only takes actoplus daily  . SLEEP APNEA, OBSTRUCTIVE     doesn't use a cpap;study done about 30yrs ago    Past Surgical History  Procedure Laterality Date  . Cholecystectomy    . Oophorectomy    . Knee arthroscopy Right   . Growth removal  2004    from thumb  . Total hip arthroplasty  06/24/2011    Procedure: TOTAL HIP ARTHROPLASTY;  Surgeon: Nestor Lewandowsky;  Location: [facility] OR;  Service: Orthopedics;  Laterality: Right;  . Abdominal hysterectomy  2000  . Eye surgery  13    shunt left and lazer eye surgery on right cataract and retenia tear with repair    No prescriptions prior to admission   Allergies  Allergen Reactions  . Lipitor (Atorvastatin Calcium)     Leg cramp  . Sitagliptin Phosphate Nausea And Vomiting  . Sulfa Drugs Cross Reactors Nausea And Vomiting    History  Substance Use Topics  . Smoking status: Never Smoker   . Smokeless tobacco: Not on  file  . Alcohol Use: No    Family History  Problem Relation Age of Onset  . Dementia Mother   . Cancer Mother     Breast and lung cancer  . Stroke Sister   . Anesthesia problems Neg Hx      Review of Systems  Constitutional: Negative.   HENT: Negative.   Eyes: Negative.   Respiratory: Negative.   Cardiovascular: Negative.   Gastrointestinal: Negative.   Genitourinary: Negative.   Musculoskeletal: Positive for joint pain.  Skin: Negative.   Neurological: Negative.   Endo/Heme/Allergies: Negative.   Psychiatric/Behavioral: Negative.     Objective:  Physical Exam  Constitutional: She is  oriented to person, place, and time. She appears well-developed and well-nourished.  HENT:  Head: Normocephalic.  Eyes: Pupils are equal, round, and reactive to light.  Cardiovascular: Intact distal pulses.   Respiratory: Effort normal.  Musculoskeletal:       Left hip: She exhibits decreased range of motion and tenderness.  Neurological: She is alert and oriented to person, place, and time.    Vital signs in last 24 hours: Temp:  [98.6 F (37 C)] 98.6 F (37 C) (05/29 1245) Pulse Rate:  [63] 63 (05/29 1245) Resp:  [20] 20 (05/29 1245) BP: (130)/(71) 130/71 mmHg (05/29 1245) SpO2:  [97 %] 97 % (05/29 1245) Weight:  [102.499 kg (225 lb 15.5 oz)] 102.499 kg (225 lb 15.5 oz) (05/29 1245)  Labs:   Estimated body mass index is 37.92 kg/(m^2) as calculated from the following:   Height as of 06/14/12: 5\' 4"  (1.626 m).   Weight as of 06/14/12: 100.245 kg (221 lb).   Imaging Review Plain radiographs demonstrate severe degenerative joint disease of the left hip(s). The bone quality appears to be adequate for age and reported activity level.  Assessment/Plan:  End stage arthritis, left hip(s)  The patient history, physical examination, clinical judgement of the provider and imaging studies are consistent with end stage degenerative joint disease of the left hip(s) and total hip arthroplasty is deemed medically necessary. The treatment options including medical management, injection therapy, arthroscopy and arthroplasty were discussed at length. The risks and benefits of total hip arthroplasty were presented and reviewed. The risks due to aseptic loosening, infection, stiffness, dislocation/subluxation,  thromboembolic complications and other imponderables were discussed.  The patient acknowledged the explanation, agreed to proceed with the plan and consent was signed. Patient is being admitted for inpatient treatment for surgery, pain control, PT, OT, prophylactic antibiotics, VTE prophylaxis,  progressive ambulation and ADL's and discharge planning.The patient is planning to be discharged home with home health services

## 2012-11-11 MED ORDER — CEFAZOLIN SODIUM-DEXTROSE 2-3 GM-% IV SOLR
2.0000 g | INTRAVENOUS | Status: AC
  Administered 2012-11-12: 2 g via INTRAVENOUS
  Filled 2012-11-11: qty 50

## 2012-11-12 ENCOUNTER — Encounter (HOSPITAL_COMMUNITY)
Admission: RE | Disposition: A | Discharge status: Home / Self Care | Payer: Self-pay | Source: Ambulatory Visit | Attending: Orthopedic Surgery

## 2012-11-12 ENCOUNTER — Encounter (HOSPITAL_COMMUNITY): Payer: Self-pay | Admitting: Surgery

## 2012-11-12 ENCOUNTER — Ambulatory Visit (HOSPITAL_COMMUNITY): Payer: 59 | Admitting: Certified Registered"

## 2012-11-12 ENCOUNTER — Inpatient Hospital Stay (HOSPITAL_COMMUNITY)
Admission: RE | Admit: 2012-11-12 | Discharge: 2012-11-16 | DRG: 470 | Disposition: A | Discharge status: Home / Self Care | Payer: 59 | Source: Ambulatory Visit | Attending: Orthopedic Surgery | Admitting: Orthopedic Surgery

## 2012-11-12 ENCOUNTER — Inpatient Hospital Stay (HOSPITAL_COMMUNITY): Payer: 59

## 2012-11-12 ENCOUNTER — Encounter (HOSPITAL_COMMUNITY): Payer: Self-pay | Admitting: Certified Registered"

## 2012-11-12 DIAGNOSIS — Z8673 Personal history of transient ischemic attack (TIA), and cerebral infarction without residual deficits: Secondary | ICD-10-CM | POA: Diagnosis not present

## 2012-11-12 DIAGNOSIS — Z96649 Presence of unspecified artificial hip joint: Secondary | ICD-10-CM | POA: Diagnosis not present

## 2012-11-12 DIAGNOSIS — J45909 Unspecified asthma, uncomplicated: Secondary | ICD-10-CM | POA: Diagnosis present

## 2012-11-12 DIAGNOSIS — IMO0001 Reserved for inherently not codable concepts without codable children: Secondary | ICD-10-CM | POA: Diagnosis not present

## 2012-11-12 DIAGNOSIS — R42 Dizziness and giddiness: Secondary | ICD-10-CM | POA: Diagnosis present

## 2012-11-12 DIAGNOSIS — D62 Acute posthemorrhagic anemia: Secondary | ICD-10-CM | POA: Diagnosis not present

## 2012-11-12 DIAGNOSIS — I10 Essential (primary) hypertension: Secondary | ICD-10-CM | POA: Diagnosis not present

## 2012-11-12 DIAGNOSIS — M1610 Unilateral primary osteoarthritis, unspecified hip: Secondary | ICD-10-CM | POA: Diagnosis not present

## 2012-11-12 DIAGNOSIS — E119 Type 2 diabetes mellitus without complications: Secondary | ICD-10-CM | POA: Diagnosis present

## 2012-11-12 DIAGNOSIS — Z6838 Body mass index (BMI) 38.0-38.9, adult: Secondary | ICD-10-CM

## 2012-11-12 DIAGNOSIS — M169 Osteoarthritis of hip, unspecified: Secondary | ICD-10-CM | POA: Diagnosis present

## 2012-11-12 DIAGNOSIS — M1612 Unilateral primary osteoarthritis, left hip: Secondary | ICD-10-CM

## 2012-11-12 DIAGNOSIS — K219 Gastro-esophageal reflux disease without esophagitis: Secondary | ICD-10-CM | POA: Diagnosis present

## 2012-11-12 DIAGNOSIS — E669 Obesity, unspecified: Secondary | ICD-10-CM | POA: Diagnosis not present

## 2012-11-12 DIAGNOSIS — E785 Hyperlipidemia, unspecified: Secondary | ICD-10-CM | POA: Diagnosis present

## 2012-11-12 DIAGNOSIS — G4733 Obstructive sleep apnea (adult) (pediatric): Secondary | ICD-10-CM | POA: Diagnosis present

## 2012-11-12 DIAGNOSIS — F411 Generalized anxiety disorder: Secondary | ICD-10-CM | POA: Diagnosis present

## 2012-11-12 DIAGNOSIS — M79609 Pain in unspecified limb: Secondary | ICD-10-CM | POA: Diagnosis not present

## 2012-11-12 HISTORY — PX: TOTAL HIP ARTHROPLASTY: SHX124

## 2012-11-12 LAB — GLUCOSE, CAPILLARY
Glucose-Capillary: 116 mg/dL — ABNORMAL HIGH (ref 70–99)
Glucose-Capillary: 132 mg/dL — ABNORMAL HIGH (ref 70–99)
Glucose-Capillary: 197 mg/dL — ABNORMAL HIGH (ref 70–99)

## 2012-11-12 LAB — HEMOGLOBIN AND HEMATOCRIT, BLOOD
HCT: 30.2 % — ABNORMAL LOW (ref 36.0–46.0)
Hemoglobin: 10 g/dL — ABNORMAL LOW (ref 12.0–15.0)

## 2012-11-12 SURGERY — ARTHROPLASTY, HIP, TOTAL,POSTERIOR APPROACH
Anesthesia: General | Site: Hip | Laterality: Left | Wound class: Clean

## 2012-11-12 MED ORDER — GLYCOPYRROLATE 0.2 MG/ML IJ SOLN
INTRAMUSCULAR | Status: DC | PRN
  Administered 2012-11-12: 0.6 mg via INTRAVENOUS

## 2012-11-12 MED ORDER — ACETAMINOPHEN 325 MG PO TABS: 650.0000 mg | ORAL_TABLET | Freq: Four times a day (QID) | ORAL | Status: DC | PRN

## 2012-11-12 MED ORDER — SIMVASTATIN 10 MG PO TABS: 10.0000 mg | ORAL_TABLET | Freq: Every day | ORAL | Status: DC

## 2012-11-12 MED ORDER — ALUM & MAG HYDROXIDE-SIMETH 200-200-20 MG/5ML PO SUSP
30.0000 mL | ORAL | Status: DC | PRN
  Administered 2012-11-15: 30 mL via ORAL
  Filled 2012-11-12: qty 30

## 2012-11-12 MED ORDER — MENTHOL 3 MG MT LOZG: 1.0000 | LOZENGE | OROMUCOSAL | Status: DC | PRN

## 2012-11-12 MED ORDER — TIMOLOL MALEATE 0.5 % OP SOLN
1.0000 [drp] | Freq: Two times a day (BID) | OPHTHALMIC | Status: DC
  Administered 2012-11-12 – 2012-11-16 (×6): 1 [drp] via OPHTHALMIC
  Filled 2012-11-12: qty 5

## 2012-11-12 MED ORDER — HYDROCODONE-ACETAMINOPHEN 5-325 MG PO TABS
ORAL_TABLET | ORAL | Status: AC
  Filled 2012-11-12: qty 2

## 2012-11-12 MED ORDER — BRIMONIDINE TARTRATE-TIMOLOL 0.2-0.5 % OP SOLN: 1.0000 [drp] | Freq: Two times a day (BID) | OPHTHALMIC | Status: DC

## 2012-11-12 MED ORDER — OXYCODONE HCL 5 MG/5ML PO SOLN: 5.0000 mg | Freq: Once | ORAL | Status: DC | PRN

## 2012-11-12 MED ORDER — INSULIN ASPART 100 UNIT/ML ~~LOC~~ SOLN: 0.0000 [IU] | Freq: Three times a day (TID) | SUBCUTANEOUS | Status: DC

## 2012-11-12 MED ORDER — MEPERIDINE HCL 25 MG/ML IJ SOLN: 6.2500 mg | INTRAMUSCULAR | Status: DC | PRN

## 2012-11-12 MED ORDER — PREDNISOLONE ACETATE 1 % OP SUSP
1.0000 [drp] | Freq: Four times a day (QID) | OPHTHALMIC | Status: DC
  Administered 2012-11-12 – 2012-11-16 (×8): 1 [drp] via OPHTHALMIC
  Filled 2012-11-12: qty 1

## 2012-11-12 MED ORDER — LACTATED RINGERS IV SOLN
INTRAVENOUS | Status: DC
  Administered 2012-11-12: 11:00:00 via INTRAVENOUS

## 2012-11-12 MED ORDER — LIDOCAINE HCL (CARDIAC) 20 MG/ML IV SOLN
INTRAVENOUS | Status: DC | PRN
  Administered 2012-11-12: 50 mg via INTRAVENOUS

## 2012-11-12 MED ORDER — CEFUROXIME SODIUM 1.5 G IJ SOLR
INTRAMUSCULAR | Status: AC
  Filled 2012-11-12: qty 1.5

## 2012-11-12 MED ORDER — BRIMONIDINE TARTRATE 0.2 % OP SOLN
1.0000 [drp] | Freq: Two times a day (BID) | OPHTHALMIC | Status: DC
  Administered 2012-11-12 – 2012-11-16 (×6): 1 [drp] via OPHTHALMIC
  Filled 2012-11-12: qty 5

## 2012-11-12 MED ORDER — MAGNESIUM HYDROXIDE 400 MG/5ML PO SUSP: 30.0000 mL | Freq: Every day | ORAL | Status: DC | PRN

## 2012-11-12 MED ORDER — ROCURONIUM BROMIDE 100 MG/10ML IV SOLN
INTRAVENOUS | Status: DC | PRN
  Administered 2012-11-12: 50 mg via INTRAVENOUS

## 2012-11-12 MED ORDER — HYDROCHLOROTHIAZIDE 12.5 MG PO CAPS
12.5000 mg | ORAL_CAPSULE | Freq: Every day | ORAL | Status: DC
  Administered 2012-11-13 – 2012-11-15 (×2): 12.5 mg via ORAL
  Filled 2012-11-12 (×5): qty 1

## 2012-11-12 MED ORDER — FENTANYL CITRATE 0.05 MG/ML IJ SOLN
INTRAMUSCULAR | Status: DC | PRN
  Administered 2012-11-12 (×4): 50 ug via INTRAVENOUS
  Administered 2012-11-12: 250 ug via INTRAVENOUS
  Administered 2012-11-12: 50 ug via INTRAVENOUS

## 2012-11-12 MED ORDER — KCL IN DEXTROSE-NACL 20-5-0.45 MEQ/L-%-% IV SOLN
INTRAVENOUS | Status: AC
  Filled 2012-11-12: qty 1000

## 2012-11-12 MED ORDER — ASPIRIN EC 325 MG PO TBEC: 325.0000 mg | DELAYED_RELEASE_TABLET | Freq: Two times a day (BID) | ORAL | Status: DC

## 2012-11-12 MED ORDER — SCOPOLAMINE 1 MG/3DAYS TD PT72: 1.0000 | MEDICATED_PATCH | TRANSDERMAL | Status: DC

## 2012-11-12 MED ORDER — DEXTROSE 5 % IV SOLN
500.0000 mg | Freq: Four times a day (QID) | INTRAVENOUS | Status: DC | PRN
  Filled 2012-11-12: qty 5

## 2012-11-12 MED ORDER — KCL IN DEXTROSE-NACL 20-5-0.45 MEQ/L-%-% IV SOLN
INTRAVENOUS | Status: DC
  Administered 2012-11-13 (×2): via INTRAVENOUS
  Filled 2012-11-12 (×13): qty 1000

## 2012-11-12 MED ORDER — PIOGLITAZONE HCL 15 MG PO TABS
15.0000 mg | ORAL_TABLET | Freq: Every day | ORAL | Status: DC
  Administered 2012-11-13 – 2012-11-15 (×3): 15 mg via ORAL
  Filled 2012-11-12 (×5): qty 1

## 2012-11-12 MED ORDER — HYDROMORPHONE HCL PF 1 MG/ML IJ SOLN
0.2500 mg | INTRAMUSCULAR | Status: DC | PRN
  Administered 2012-11-12: 0.5 mg via INTRAVENOUS
  Administered 2012-11-12: 0.25 mg via INTRAVENOUS

## 2012-11-12 MED ORDER — PROMETHAZINE HCL 25 MG/ML IJ SOLN: 6.2500 mg | INTRAMUSCULAR | Status: DC | PRN

## 2012-11-12 MED ORDER — BIMATOPROST 0.01 % OP SOLN
1.0000 [drp] | Freq: Every day | OPHTHALMIC | Status: DC
  Administered 2012-11-12 – 2012-11-15 (×4): 1 [drp] via OPHTHALMIC
  Filled 2012-11-12: qty 2.5

## 2012-11-12 MED ORDER — ONDANSETRON HCL 4 MG PO TABS
4.0000 mg | ORAL_TABLET | Freq: Four times a day (QID) | ORAL | Status: DC | PRN
  Filled 2012-11-12: qty 1

## 2012-11-12 MED ORDER — METHOCARBAMOL 500 MG PO TABS
ORAL_TABLET | ORAL | Status: AC
  Filled 2012-11-12: qty 1

## 2012-11-12 MED ORDER — METHOCARBAMOL 500 MG PO TABS
500.0000 mg | ORAL_TABLET | Freq: Four times a day (QID) | ORAL | Status: DC | PRN
  Administered 2012-11-12 – 2012-11-15 (×5): 500 mg via ORAL
  Filled 2012-11-12 (×4): qty 1

## 2012-11-12 MED ORDER — HYDROCODONE-ACETAMINOPHEN 7.5-325 MG PO TABS
1.0000 | ORAL_TABLET | ORAL | Status: DC | PRN
  Administered 2012-11-13 – 2012-11-14 (×4): 2 via ORAL
  Filled 2012-11-12 (×4): qty 2

## 2012-11-12 MED ORDER — DEXTROSE-NACL 5-0.45 % IV SOLN: INTRAVENOUS | Status: DC

## 2012-11-12 MED ORDER — LISINOPRIL-HYDROCHLOROTHIAZIDE 20-12.5 MG PO TABS: 1.0000 | ORAL_TABLET | Freq: Every day | ORAL | Status: DC

## 2012-11-12 MED ORDER — PRAVASTATIN SODIUM 20 MG PO TABS
20.0000 mg | ORAL_TABLET | Freq: Every day | ORAL | Status: DC
  Administered 2012-11-12 – 2012-11-15 (×4): 20 mg via ORAL
  Filled 2012-11-12 (×5): qty 1

## 2012-11-12 MED ORDER — SCOPOLAMINE 1 MG/3DAYS TD PT72
MEDICATED_PATCH | TRANSDERMAL | Status: AC
  Administered 2012-11-12: 1.5 mg via TRANSDERMAL
  Filled 2012-11-12: qty 1

## 2012-11-12 MED ORDER — ACETAZOLAMIDE ER 500 MG PO CP12
500.0000 mg | ORAL_CAPSULE | Freq: Two times a day (BID) | ORAL | Status: DC
  Administered 2012-11-12 – 2012-11-15 (×7): 500 mg via ORAL
  Filled 2012-11-12 (×9): qty 1

## 2012-11-12 MED ORDER — PIOGLITAZONE HCL-METFORMIN HCL 15-500 MG PO TABS: 1.0000 | ORAL_TABLET | Freq: Every day | ORAL | Status: DC

## 2012-11-12 MED ORDER — ASPIRIN EC 325 MG PO TBEC
325.0000 mg | DELAYED_RELEASE_TABLET | Freq: Two times a day (BID) | ORAL | Status: DC
  Administered 2012-11-12 – 2012-11-15 (×7): 325 mg via ORAL
  Filled 2012-11-12 (×9): qty 1

## 2012-11-12 MED ORDER — HYDROMORPHONE HCL PF 1 MG/ML IJ SOLN
INTRAMUSCULAR | Status: AC
  Administered 2012-11-12: 0.25 mg via INTRAVENOUS
  Filled 2012-11-12: qty 1

## 2012-11-12 MED ORDER — METHOCARBAMOL 500 MG PO TABS: 500.0000 mg | ORAL_TABLET | Freq: Four times a day (QID) | ORAL | Status: DC | PRN

## 2012-11-12 MED ORDER — OXYCODONE HCL 5 MG PO TABS
ORAL_TABLET | ORAL | Status: AC
  Filled 2012-11-12: qty 1

## 2012-11-12 MED ORDER — SODIUM CHLORIDE 0.9 % IR SOLN
Status: DC | PRN
  Administered 2012-11-12: 1

## 2012-11-12 MED ORDER — BUPIVACAINE-EPINEPHRINE 0.5% -1:200000 IJ SOLN
INTRAMUSCULAR | Status: DC | PRN
  Administered 2012-11-12: 30 mL

## 2012-11-12 MED ORDER — ONDANSETRON HCL 4 MG/2ML IJ SOLN
4.0000 mg | Freq: Four times a day (QID) | INTRAMUSCULAR | Status: DC | PRN
  Administered 2012-11-12: 4 mg via INTRAVENOUS
  Filled 2012-11-12 (×2): qty 2

## 2012-11-12 MED ORDER — DIPHENHYDRAMINE HCL 12.5 MG/5ML PO ELIX: 12.5000 mg | ORAL_SOLUTION | ORAL | Status: DC | PRN

## 2012-11-12 MED ORDER — BISACODYL 5 MG PO TBEC: 5.0000 mg | DELAYED_RELEASE_TABLET | Freq: Every day | ORAL | Status: DC | PRN

## 2012-11-12 MED ORDER — PHENYLEPHRINE HCL 10 MG/ML IJ SOLN
INTRAMUSCULAR | Status: DC | PRN
  Administered 2012-11-12: 40 ug via INTRAVENOUS
  Administered 2012-11-12 (×2): 100 ug via INTRAVENOUS
  Administered 2012-11-12: 40 ug via INTRAVENOUS

## 2012-11-12 MED ORDER — HYDROMORPHONE HCL PF 1 MG/ML IJ SOLN
1.0000 mg | INTRAMUSCULAR | Status: DC | PRN
  Administered 2012-11-12 – 2012-11-13 (×8): 1 mg via INTRAVENOUS
  Filled 2012-11-12 (×8): qty 1

## 2012-11-12 MED ORDER — ALBUMIN HUMAN 5 % IV SOLN
INTRAVENOUS | Status: DC | PRN
  Administered 2012-11-12: 15:00:00 via INTRAVENOUS

## 2012-11-12 MED ORDER — METOCLOPRAMIDE HCL 5 MG/ML IJ SOLN
5.0000 mg | Freq: Three times a day (TID) | INTRAMUSCULAR | Status: DC | PRN
  Administered 2012-11-13: 10 mg via INTRAVENOUS
  Filled 2012-11-12: qty 2

## 2012-11-12 MED ORDER — LACTATED RINGERS IV SOLN
INTRAVENOUS | Status: DC | PRN
  Administered 2012-11-12 (×3): via INTRAVENOUS

## 2012-11-12 MED ORDER — ACETAMINOPHEN 650 MG RE SUPP: 650.0000 mg | Freq: Four times a day (QID) | RECTAL | Status: DC | PRN

## 2012-11-12 MED ORDER — METOCLOPRAMIDE HCL 10 MG PO TABS: 5.0000 mg | ORAL_TABLET | Freq: Three times a day (TID) | ORAL | Status: DC | PRN

## 2012-11-12 MED ORDER — ONDANSETRON HCL 4 MG/2ML IJ SOLN
INTRAMUSCULAR | Status: DC | PRN
  Administered 2012-11-12 (×2): 4 mg via INTRAVENOUS

## 2012-11-12 MED ORDER — ZOLPIDEM TARTRATE 5 MG PO TABS: 5.0000 mg | ORAL_TABLET | Freq: Every evening | ORAL | Status: DC | PRN

## 2012-11-12 MED ORDER — BUPIVACAINE-EPINEPHRINE PF 0.5-1:200000 % IJ SOLN
INTRAMUSCULAR | Status: AC
  Filled 2012-11-12: qty 30

## 2012-11-12 MED ORDER — FLEET ENEMA 7-19 GM/118ML RE ENEM: 1.0000 | ENEMA | Freq: Once | RECTAL | Status: AC | PRN

## 2012-11-12 MED ORDER — METFORMIN HCL 500 MG PO TABS
500.0000 mg | ORAL_TABLET | Freq: Every day | ORAL | Status: DC
  Administered 2012-11-14 – 2012-11-16 (×3): 500 mg via ORAL
  Filled 2012-11-12 (×5): qty 1

## 2012-11-12 MED ORDER — PHENOL 1.4 % MT LIQD: 1.0000 | OROMUCOSAL | Status: DC | PRN

## 2012-11-12 MED ORDER — PROPOFOL 10 MG/ML IV BOLUS
INTRAVENOUS | Status: DC | PRN
  Administered 2012-11-12: 200 mg via INTRAVENOUS

## 2012-11-12 MED ORDER — LISINOPRIL 20 MG PO TABS
20.0000 mg | ORAL_TABLET | Freq: Every day | ORAL | Status: DC
  Administered 2012-11-13 – 2012-11-15 (×2): 20 mg via ORAL
  Filled 2012-11-12 (×5): qty 1

## 2012-11-12 MED ORDER — OXYCODONE-ACETAMINOPHEN 5-325 MG PO TABS: 1.0000 | ORAL_TABLET | ORAL | Status: DC | PRN

## 2012-11-12 MED ORDER — MIDAZOLAM HCL 2 MG/2ML IJ SOLN: 0.5000 mg | Freq: Once | INTRAMUSCULAR | Status: DC | PRN

## 2012-11-12 MED ORDER — NEOSTIGMINE METHYLSULFATE 1 MG/ML IJ SOLN
INTRAMUSCULAR | Status: DC | PRN
  Administered 2012-11-12: 4 mg via INTRAVENOUS

## 2012-11-12 MED ORDER — MIDAZOLAM HCL 5 MG/5ML IJ SOLN
INTRAMUSCULAR | Status: DC | PRN
  Administered 2012-11-12: 2 mg via INTRAVENOUS

## 2012-11-12 MED ORDER — OXYCODONE HCL 5 MG PO TABS: 5.0000 mg | ORAL_TABLET | Freq: Once | ORAL | Status: DC | PRN

## 2012-11-12 SURGICAL SUPPLY — 59 items
BLADE SAW SAG 73X25 THK (BLADE) ×1
BLADE SAW SGTL 18X1.27X75 (BLADE) IMPLANT
BLADE SAW SGTL 73X25 THK (BLADE) ×1 IMPLANT
BLADE SAW SGTL MED 73X18.5 STR (BLADE) IMPLANT
BRUSH FEMORAL CANAL (MISCELLANEOUS) IMPLANT
CLOTH BEACON ORANGE TIMEOUT ST (SAFETY) ×2 IMPLANT
COVER BACK TABLE 24X17X13 BIG (DRAPES) IMPLANT
COVER SURGICAL LIGHT HANDLE (MISCELLANEOUS) ×3 IMPLANT
DECANTER SPIKE VIAL GLASS SM (MISCELLANEOUS) ×1 IMPLANT
DRAPE ORTHO SPLIT 77X108 STRL (DRAPES) ×2
DRAPE PROXIMA HALF (DRAPES) ×2 IMPLANT
DRAPE SURG ORHT 6 SPLT 77X108 (DRAPES) ×1 IMPLANT
DRAPE U-SHAPE 47X51 STRL (DRAPES) ×2 IMPLANT
DRILL BIT 7/64X5 (BIT) ×2 IMPLANT
DRSG MEPILEX BORDER 4X12 (GAUZE/BANDAGES/DRESSINGS) ×2 IMPLANT
DRSG MEPILEX BORDER 4X8 (GAUZE/BANDAGES/DRESSINGS) ×1 IMPLANT
DURAPREP 26ML APPLICATOR (WOUND CARE) ×2 IMPLANT
ELECT BLADE 4.0 EZ CLEAN MEGAD (MISCELLANEOUS) ×2
ELECT REM PT RETURN 9FT ADLT (ELECTROSURGICAL) ×2
ELECTRODE BLDE 4.0 EZ CLN MEGD (MISCELLANEOUS) IMPLANT
ELECTRODE REM PT RTRN 9FT ADLT (ELECTROSURGICAL) ×1 IMPLANT
GAUZE XEROFORM 1X8 LF (GAUZE/BANDAGES/DRESSINGS) ×1 IMPLANT
GLOVE BIO SURGEON STRL SZ7 (GLOVE) ×2 IMPLANT
GLOVE BIO SURGEON STRL SZ7.5 (GLOVE) ×2 IMPLANT
GLOVE BIOGEL PI IND STRL 7.0 (GLOVE) ×1 IMPLANT
GLOVE BIOGEL PI IND STRL 8 (GLOVE) ×1 IMPLANT
GLOVE BIOGEL PI INDICATOR 7.0 (GLOVE) ×1
GLOVE BIOGEL PI INDICATOR 8 (GLOVE) ×1
GLOVE SURG ORTHO 7.0 STRL STRW (GLOVE) ×1 IMPLANT
GOWN PREVENTION PLUS XLARGE (GOWN DISPOSABLE) ×2 IMPLANT
GOWN STRL NON-REIN LRG LVL3 (GOWN DISPOSABLE) ×4 IMPLANT
HANDPIECE INTERPULSE COAX TIP (DISPOSABLE)
HOOD PEEL AWAY FACE SHEILD DIS (HOOD) ×4 IMPLANT
KIT BASIN OR (CUSTOM PROCEDURE TRAY) ×2 IMPLANT
KIT ROOM TURNOVER OR (KITS) ×2 IMPLANT
MANIFOLD NEPTUNE II (INSTRUMENTS) ×2 IMPLANT
NEEDLE 22X1 1/2 (OR ONLY) (NEEDLE) ×2 IMPLANT
NS IRRIG 1000ML POUR BTL (IV SOLUTION) ×2 IMPLANT
PACK TOTAL JOINT (CUSTOM PROCEDURE TRAY) ×2 IMPLANT
PAD ARMBOARD 7.5X6 YLW CONV (MISCELLANEOUS) ×4 IMPLANT
PASSER SUT SWANSON 36MM LOOP (INSTRUMENTS) ×2 IMPLANT
PRESSURIZER FEMORAL UNIV (MISCELLANEOUS) IMPLANT
SET HNDPC FAN SPRY TIP SCT (DISPOSABLE) IMPLANT
SPONGE LAP 18X18 X RAY DECT (DISPOSABLE) ×1 IMPLANT
SUT ETHIBOND 2 V 37 (SUTURE) ×2 IMPLANT
SUT ETHILON 3 0 FSL (SUTURE) ×2 IMPLANT
SUT VIC AB 0 CT1 27 (SUTURE) ×2
SUT VIC AB 0 CT1 27XBRD ANBCTR (SUTURE) IMPLANT
SUT VIC AB 0 CTB1 27 (SUTURE) ×2 IMPLANT
SUT VIC AB 1 CTX 36 (SUTURE) ×2
SUT VIC AB 1 CTX36XBRD ANBCTR (SUTURE) ×1 IMPLANT
SUT VIC AB 2-0 CTB1 (SUTURE) ×2 IMPLANT
SYR CONTROL 10ML LL (SYRINGE) ×2 IMPLANT
TAPE CLOTH SURG 4X10 WHT LF (GAUZE/BANDAGES/DRESSINGS) ×1 IMPLANT
TOWEL OR 17X24 6PK STRL BLUE (TOWEL DISPOSABLE) ×2 IMPLANT
TOWEL OR 17X26 10 PK STRL BLUE (TOWEL DISPOSABLE) ×2 IMPLANT
TOWER CARTRIDGE SMART MIX (DISPOSABLE) IMPLANT
TRAY FOLEY CATH 14FR (SET/KITS/TRAYS/PACK) ×1 IMPLANT
WATER STERILE IRR 1000ML POUR (IV SOLUTION) ×6 IMPLANT

## 2012-11-12 NOTE — Op Note (Signed)
OPERATIVE REPORT    DATE OF PROCEDURE:  11/12/2012       PREOPERATIVE DIAGNOSIS:  LEFT HIP OSTEOARTHRITIS                                                          POSTOPERATIVE DIAGNOSIS:  LEFT HIP OSTEOARTHRITIS, posterior acetabular wall deficiency found at surgery.                                                           PROCEDURE:  L total hip arthroplasty using a 52 mm DePuy Gryption Cup, 2 [employer], 10-degree polyethylene liner index superior  and posterior, a +0 36 mm ceramic head, a 16x11x36x150 SROM stem, 16Dsm Sleeve, acetabular shell was placed only 5-10 of anteversion secondary to posterior acetabular wall deficiency. Because of this the stem itself was anteverted 20 in relation to the CaldeCORT for a total of 40 anteversion on the stem.   SURGEON: NFAOZ,HYQMV J    ASSISTANT:   Mauricia Area, PA-C  (present throughout entire procedure and necessary for timely completion of the procedure)   ANESTHESIA: General BLOOD LOSS: 1000 FLUID REPLACEMENT: 2400 crystalloid DRAINS: Foley Catheter URINE OUTPUT: 300cc COMPLICATIONS: none    INDICATIONS FOR PROCEDURE: A 67 y.o. year-old With  LEFT HIP OSTEOARTHRITIS   for 3 years, x-rays show bone-on-bone arthritic changes. Despite conservative measures with observation, anti-inflammatory medicine, narcotics, use of a cane, has severe unremitting pain and can ambulate only a few blocks before resting. Patient had a right total up arthroplasty last year using a 52 mm cup and a 16 x 11 x 36 x 1 50 S-ROM stem we used a +3 ceramic head.  Patient desires elective L total hip arthroplasty to decrease pain and increase function. The risks, benefits, and alternatives were discussed at length including but not limited to the risks of infection, bleeding, nerve injury, stiffness, blood clots, the need for revision surgery, cardiopulmonary complications, among others, and they were willing to proceed. Questions answered      PROCEDURE IN DETAIL: The patient was identified by armband,  received preoperative IV antibiotics in the holding area at [facility], taken to the operating room , appropriate anesthetic monitors  were attached and general endotracheal anesthesia induced. Foley catheter was inserted. Pt was rolled into the R lateral decubitus position and fixed there with a Stulberg Mark II pelvic clamp.  The L lower extremity was then prepped and draped  in the usual sterile fashion from the ankle to the hemipelvis. A time-out  procedure was performed. The skin along the lateral hip and thigh  infiltrated with 10 mL of 0.5% Marcaine and epinephrine solution. We  then made a posterolateral approach to the hip. With a #10 blade, a 24 cm  incision was made through the skin and subcutaneous tissue down to the level of the  IT band. Small bleeders were identified and cauterized. The IT band was cut in  line with skin incision exposing the greater trochanter. A Cobra retractor was placed between the gluteus minimus and the superior hip joint capsule, and a spiked Cobra between  the quadratus femoris and the inferior hip joint capsule. This isolated the short  external rotators and piriformis tendons. These were tagged with a #2 Ethibond  suture and cut off their insertion on the intertrochanteric crest. The posterior  capsule was then developed into an acetabular-based flap from Posterior Superior off of the acetabulum out over the femoral neck and back posterior inferior to the acetabular rim. This flap was tagged with two #2 Ethibond sutures and retracted protecting the sciatic nerve. This exposed the arthritic femoral head and osteophytes. The hip was then flexed and internally rotated, dislocating the femoral head and a standard neck cut performed 1 fingerbreadth above the lesser trochanter.  A spiked Cobra was placed in the cotyloid notch and a Hohmann retractor was then used to lever the femur anteriorly off  of the anterior pelvic column. A posterior-inferior wing retractor was placed at the junction of the acetabulum and the ischium completing the acetabular exposure.We then removed the peripheral osteophytes and labrum from the acetabulum. At this time patient was noted to have posterior superior acetabular wall insufficiency. We then reamed the acetabulum up to 51 mm with basket reamers Revealing a relatively flat acetabulum secondary to the posterior superior wall insufficiency. Because of the insufficiency it was felt that a Gryption cup with superior dome screws would be required and would be relatively flat to obtain fixation on the bone.. We then irrigated with normal  saline solution and hammered into place a *52 mm Gryption cup in 50 degrees of abduction and about 5 degrees of anteversion. More  peripheral osteophytes removed and a trial 10-degree liner placed with the  index superior-posterior. The hip was then flexed and internally rotated exposing the  proximal femur, which was entered with the initiating reamer followed by  the axial reamers up to a 11.5 mm full depth and 12mm partial depth. We then conically reamed to 16D to the correct depth for a 36 base neck. The calcar was milled to 16Dsm. A trial cone and stem was inserted in the 40 degrees anteversion, with a +0 36mm trial head. Trial reduction was then performed and excellent stability was noted with at 90 of flexion with 50 of internal rotation and then full extension with maximal external rotation. The hip could not be dislocated in full extension. The knee could easily flex  to about 130 degrees. We also stretched the abductors at this point,  because of the preexisting adductor contractures. All trial components  were then removed. The acetabulum was irrigated out with normal saline  solution. A titanium [facility] was then screwed into place  followed by a 10-degree polyethylene liner index superior-posterior. On  the  femoral side a 16Dsm ZTT1 sleeve was hammered into place, followed by a 16x11x36x150 SROM stem in 40 degrees of anteversion. At this point, a +0 36 mm ceramic head was  hammered on the stem. The hip was reduced. We checked our stability  one more time and found it to be excellent. The wound was once again  thoroughly irrigated out with normal saline solution pulse lavage. The  capsular flap and short external rotators were repaired back to the  intertrochanteric crest through drill holes with a #2 Ethibond suture.  The IT band was closed with running 1 Vicryl suture. The subcutaneous  tissue with 0 x 2 and 2-0 undyed Vicryl suture and the skin with running  interlocking 3-0 nylon suture. Dressing of Xeroform and Mepilex was  then applied. The patient was  then unclamped, rolled supine, awaken extubated and taken to recovery room without difficulty in stable condition.   Gean Birchwood J 11/12/2012, 2:42 PM

## 2012-11-12 NOTE — Transfer of Care (Signed)
Immediate Anesthesia Transfer of Care Note  Patient: [name]  Procedure(s) Performed: Procedure(s) with comments: TOTAL HIP ARTHROPLASTY (Left) - DEPUY PINNACLE  Patient Location: PACU  Anesthesia Type:General  Level of Consciousness: awake, alert  and oriented  Airway & Oxygen Therapy: Patient Spontanous Breathing and Patient connected to nasal cannula oxygen  Post-op Assessment: Report given to PACU RN and Post -op Vital signs reviewed and stable  Post vital signs: Reviewed and stable  Complications: No apparent anesthesia complications

## 2012-11-12 NOTE — Interval H&P Note (Signed)
History and Physical Interval Note:  11/12/2012 12:36 PM  [name]  has presented today for surgery, with the diagnosis of LEFT HIP OSTEOARTHRITIS  The various methods of treatment have been discussed with the patient and family. After consideration of risks, benefits and other options for treatment, the patient has consented to  Procedure(s) with comments: TOTAL HIP ARTHROPLASTY (Left) - DEPUY PINNACLE as a surgical intervention .  The patient's history has been reviewed, patient examined, no change in status, stable for surgery.  I have reviewed the patient's chart and labs.  Questions were answered to the patient's satisfaction.     Nestor Lewandowsky

## 2012-11-12 NOTE — Anesthesia Preprocedure Evaluation (Signed)
Anesthesia Evaluation  Patient identified by MRN, date of birth, ID band Patient awake    Reviewed: Allergy & Precautions, H&P , NPO status , Patient's Chart, lab work & pertinent test results  History of Anesthesia Complications (+) PONV  Airway Mallampati: II TM Distance: >3 FB Neck ROM: Full    Dental  (+) Teeth Intact and Dental Advisory Given   Pulmonary sleep apnea (doesn't use CPAP) ,  breath sounds clear to auscultation  Pulmonary exam normal       Cardiovascular hypertension, Pt. on medications Rhythm:Regular Rate:Normal     Neuro/Psych Anxiety TIA   GI/Hepatic Neg liver ROS, GERD-  Controlled,  Endo/Other  diabetes (glu 116), Well Controlled, Type 2, Oral Hypoglycemic AgentsMorbid obesity  Renal/GU negative Renal ROS     Musculoskeletal   Abdominal (+) + obese,   Peds  Hematology negative hematology ROS (+)   Anesthesia Other Findings   Reproductive/Obstetrics                           Anesthesia Physical Anesthesia Plan  ASA: III  Anesthesia Plan: General   Post-op Pain Management:    Induction: Intravenous  Airway Management Planned: Oral ETT  Additional Equipment:   Intra-op Plan:   Post-operative Plan: Extubation in OR  Informed Consent: I have reviewed the patients History and Physical, chart, labs and discussed the procedure including the risks, benefits and alternatives for the proposed anesthesia with the patient or authorized representative who has indicated his/her understanding and acceptance.   Dental advisory given  Plan Discussed with: CRNA and Surgeon  Anesthesia Plan Comments: (Plan routine monitors, GETA)        Anesthesia Quick Evaluation

## 2012-11-13 ENCOUNTER — Encounter (HOSPITAL_COMMUNITY): Payer: Self-pay | Admitting: General Practice

## 2012-11-13 LAB — BASIC METABOLIC PANEL
BUN: 12 mg/dL (ref 6–23)
CO2: 22 mEq/L (ref 19–32)
Calcium: 8 mg/dL — ABNORMAL LOW (ref 8.4–10.5)
Chloride: 107 mEq/L (ref 96–112)
Creatinine, Ser: 0.64 mg/dL (ref 0.50–1.10)
GFR calc Af Amer: >90 mL/min (ref >90)
GFR calc non Af Amer: >90 mL/min (ref >90)
Glucose, Bld: 164 mg/dL — ABNORMAL HIGH (ref 70–99)
Potassium: 4.4 mEq/L (ref 3.5–5.1)
Sodium: 137 mEq/L (ref 135–145)

## 2012-11-13 LAB — CBC
HCT: 27.4 % — ABNORMAL LOW (ref 36.0–46.0)
Hemoglobin: 9.2 g/dL — ABNORMAL LOW (ref 12.0–15.0)
MCH: 28 pg (ref 26.0–34.0)
MCHC: 33.6 g/dL (ref 30.0–36.0)
MCV: 83.5 fL (ref 78.0–100.0)
Platelets: 208 10*3/uL (ref 150–400)
RBC: 3.28 MIL/uL — ABNORMAL LOW (ref 3.87–5.11)
RDW: 14.5 % (ref 11.5–15.5)
WBC: 8.3 10*3/uL (ref 4.0–10.5)

## 2012-11-13 LAB — GLUCOSE, CAPILLARY
Glucose-Capillary: 169 mg/dL — ABNORMAL HIGH (ref 70–99)
Glucose-Capillary: 164 mg/dL — ABNORMAL HIGH (ref 70–99)
Glucose-Capillary: 153 mg/dL — ABNORMAL HIGH (ref 70–99)
Glucose-Capillary: 160 mg/dL — ABNORMAL HIGH (ref 70–99)

## 2012-11-13 NOTE — Progress Notes (Signed)
Patient ID: [name], female   DOB: 1946-05-26, 67 y.o.   MRN: [id number] PATIENT ID: AERIS HERSMAN  MRN: [id number]  DOB/AGE:  10-Jun-1946 / 67 y.o.  1 Day Post-Op Procedure(s) (LRB): TOTAL HIP ARTHROPLASTY (Left)    PROGRESS NOTE Subjective: Patient is alert, oriented,no Nausea, vomiting x3 since surgery, very similar to her last operation in 2013, yes passing gas, no Bowel Movement. Taking PO well this morning. Denies SOB, Chest or Calf Pain. Using Incentive Spirometer, PAS in place. Ambulate patient will be weightbearing as tolerated when she gets up with PT. 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 Patient reports pain as 4 on 0-10 scale  .    Objective: Vital signs in last 24 hours: Filed Vitals:   11/12/12 2000 11/12/12 2042 11/13/12 0000 11/13/12 0222  BP:  140/71  133/65  Pulse:  97  80  Temp:  97.9 F (36.6 C)  98.1 F (36.7 C)  TempSrc:  Oral  Oral  Resp: 18 16 18 16   SpO2: 99% 99% 96% 97%      Intake/Output from previous day: I/O last 3 completed shifts: In: 2490 [P.O.:240; I.V.:2000; IV Piggyback:250] Out: 1275 [Urine:375; Blood:900]   Intake/Output this shift:     LABORATORY DATA:  Recent Labs  11/12/12 0950 11/12/12 1515 11/12/12 1910 11/12/12 2115 11/13/12 0430  WBC  --   --   --   --  8.3  HGB  --   --  10.0*  --  9.2*  HCT  --   --  30.2*  --  27.4*  PLT  --   --   --   --  208  NA  --   --   --   --  137  K  --   --   --   --  4.4  CL  --   --   --   --  107  CO2  --   --   --   --  22  BUN  --   --   --   --  12  CREATININE  --   --   --   --  0.64  GLUCOSE  --   --   --   --  164*  GLUCAP 116* 132*  --  197*  --   CALCIUM  --   --   --   --  8.0*    Examination: Neurologically intact ABD soft Neurovascular intact Sensation intact distally Intact pulses distally Dorsiflexion/Plantar flexion intact Incision: moderate drainage No cellulitis present Compartment  soft} XR AP&Lat of hip shows well placed\fixed THA  Assessment:   1 Day Post-Op Procedure(s) (LRB): TOTAL HIP ARTHROPLASTY (Left) ADDITIONAL DIAGNOSIS:  Hypertension , type 2 diabetes.  Plan: PT/OT WBAT, THA  posterior precautions, encourage by mouth fluids to increase urine output. Change dressing today new Mepilex.  DVT Prophylaxis: SCDx72 hrs, ASA 325 mg BID x 2 weeks  DISCHARGE PLAN: Home, probably in one to 2 days. When she had her right total hip done in 2013 she was in the hospital from Monday through Thursday.  DISCHARGE NEEDS: HHPT, HHRN, CPM, Walker and 3-in-1 comode seat

## 2012-11-13 NOTE — Progress Notes (Signed)
Utilization review complete. Troi Bechtold RN CCM Case Mgmt phone [phone] 

## 2012-11-13 NOTE — Evaluation (Signed)
Physical Therapy Evaluation Patient Details Name: [name] MRN: [id number] DOB: 1945-11-09 Today's Date: 11/13/2012 Time: 4098-1191 PT Time Calculation (min): 27 min  PT Assessment / Plan / Recommendation Clinical Impression  Pt s/p L THR presents with decreased L LE strength/ROM, post op pain and post THP limiting functional mobility.    PT Assessment  Patient needs continued PT services    Follow Up Recommendations  Home health PT    Does the patient have the potential to tolerate intense rehabilitation      Barriers to Discharge None      Equipment Recommendations  None recommended by PT    Recommendations for Other Services OT consult   Frequency 7X/week    Precautions / Restrictions Precautions Precautions: Posterior Hip;Fall Precaution Booklet Issued: Yes (comment) Precaution Comments: Pt educated on THR - unable to recall from previous surgery Restrictions Weight Bearing Restrictions: No LLE Weight Bearing: Weight bearing as tolerated   Pertinent Vitals/Pain 6/10; premed      Mobility  Bed Mobility Bed Mobility: Supine to Sit Supine to Sit: 3: Mod assist Details for Bed Mobility Assistance: cues for sequence and use of R LE to self assist Transfers Transfers: Sit to Stand;Stand to Sit Sit to Stand: 3: Mod assist Stand to Sit: 3: Mod assist Details for Transfer Assistance: cues for LE managment and use of UEs to self assist Ambulation/Gait Ambulation/Gait Assistance: 3: Mod assist Ambulation Distance (Feet): 6 Feet Assistive device: Rolling walker Ambulation/Gait Assistance Details: cues for posture, sequence, stride length and position from RW Gait Pattern: Step-to pattern;Decreased step length - right;Decreased step length - left;Decreased stance time - left Gait velocity: slow Stairs: No    Exercises Total Joint Exercises Ankle Circles/Pumps: AROM;15 reps;Supine;Both Quad Sets: AROM;10 reps;Both;Supine Heel Slides: AAROM;15  reps;Supine;Left Hip ABduction/ADduction: AAROM;10 reps;Left;Supine   PT Diagnosis: Difficulty walking  PT Problem List: Decreased strength;Decreased range of motion;Decreased activity tolerance;Decreased mobility;Decreased knowledge of use of DME;Pain;Obesity;Decreased knowledge of precautions PT Treatment Interventions: DME instruction;Gait training;Stair training;Functional mobility training;Therapeutic activities;Therapeutic exercise;Patient/family education   PT Goals Acute Rehab PT Goals PT Goal Formulation: With patient Time For Goal Achievement: 11/17/12 Potential to Achieve Goals: Good Pt will go Supine/Side to Sit: with supervision PT Goal: Supine/Side to Sit - Progress: Goal set today Pt will go Sit to Supine/Side: with supervision PT Goal: Sit to Supine/Side - Progress: Goal set today Pt will go Sit to Stand: with supervision PT Goal: Sit to Stand - Progress: Goal set today Pt will go Stand to Sit: with supervision PT Goal: Stand to Sit - Progress: Goal set today Pt will Ambulate: 51 - 150 feet;with supervision;with rolling walker PT Goal: Ambulate - Progress: Goal set today Pt will Go Up / Down Stairs: 6-9 stairs;with min assist;with least restrictive assistive device PT Goal: Up/Down Stairs - Progress: Goal set today  Visit Information  Last PT Received On: 11/13/12 Assistance Needed: +2    Subjective Data  Subjective: I had the other one done in 2012 but I'm not sure it hurt like this Patient Stated Goal: Resume previous lifestyle with decreased pain   Prior Functioning  Home Living Lives With: Spouse Available Help at Discharge: Family Type of Home: House Home Access: Stairs to enter Secretary/administrator of Steps: 6-8 Entrance Stairs-Rails: Right;Left Home Layout: One level Home Adaptive Equipment: Walker - rolling;Straight cane Prior Function Level of Independence: Independent with assistive device(s) Able to Take Stairs?: Yes Driving: Yes Vocation:  Retired Musician: No difficulties Dominant Hand: Right  Cognition  Cognition Arousal/Alertness: Awake/alert Behavior During Therapy: WFL for tasks assessed/performed Overall Cognitive Status: Within Functional Limits for tasks assessed    Extremity/Trunk Assessment Right Upper Extremity Assessment RUE ROM/Strength/Tone: WFL for tasks assessed Left Upper Extremity Assessment LUE ROM/Strength/Tone: WFL for tasks assessed Right Lower Extremity Assessment RLE ROM/Strength/Tone: WFL for tasks assessed Left Lower Extremity Assessment LLE ROM/Strength/Tone: Deficits LLE ROM/Strength/Tone Deficits: hip strength 2/5 wtih AAROM at hip to 70 flex and 15 abd   Balance    End of Session PT - End of Session Equipment Utilized During Treatment: Gait belt Activity Tolerance: Patient limited by pain;Patient limited by fatigue Patient left: in chair;with call bell/phone within reach;with family/visitor present Nurse Communication: Mobility status  GP     Hady Niemczyk 11/13/2012, 12:31 PM

## 2012-11-13 NOTE — Progress Notes (Signed)
Physical Therapy Treatment Patient Details Name: [name] MRN: [id number] DOB: 09-Aug-1945 Today's Date: 11/13/2012 Time: 1342-1400 PT Time Calculation (min): 18 min  PT Assessment / Plan / Recommendation Comments on Treatment Session       Follow Up Recommendations  Home health PT     Does the patient have the potential to tolerate intense rehabilitation     Barriers to Discharge        Equipment Recommendations  None recommended by PT    Recommendations for Other Services OT consult  Frequency 7X/week   Plan Discharge plan remains appropriate    Precautions / Restrictions Precautions Precautions: Posterior Hip;Fall Precaution Booklet Issued: Yes (comment) Precaution Comments: Pt recalls all THP without cues Restrictions Weight Bearing Restrictions: No LLE Weight Bearing: Weight bearing as tolerated   Pertinent Vitals/Pain 5/10; premed    Mobility  Bed Mobility Bed Mobility: Sit to Supine Sit to Supine: 1: +2 Total assist Sit to Supine: Patient Percentage: 60% Details for Bed Mobility Assistance: cues for sequence and use of R LE to self assist Transfers Transfers: Sit to Stand;Stand to Sit Sit to Stand: 3: Mod assist;With armrests;From chair/3-in-1;With upper extremity assist Stand to Sit: 3: Mod assist;To bed;With upper extremity assist Details for Transfer Assistance: cues for LE managment and use of UEs to self assist Ambulation/Gait Ambulation/Gait Assistance: 4: Min assist;3: Mod assist Ambulation Distance (Feet): 19 Feet Assistive device: Rolling walker Ambulation/Gait Assistance Details: cues for sequence, posture and position from RW Gait Pattern: Step-to pattern;Decreased step length - right;Decreased step length - left;Decreased stance time - left Gait velocity: slow Stairs: No    Exercises     PT Diagnosis:    PT Problem List:   PT Treatment Interventions:     PT Goals Acute Rehab PT Goals PT Goal Formulation: With patient Time For Goal  Achievement: 11/17/12 Potential to Achieve Goals: Good Pt will go Supine/Side to Sit: with supervision PT Goal: Supine/Side to Sit - Progress: Goal set today Pt will go Sit to Supine/Side: with supervision PT Goal: Sit to Supine/Side - Progress: Goal set today Pt will go Sit to Stand: with supervision PT Goal: Sit to Stand - Progress: Goal set today Pt will go Stand to Sit: with supervision PT Goal: Stand to Sit - Progress: Goal set today Pt will Ambulate: 51 - 150 feet;with supervision;with rolling walker PT Goal: Ambulate - Progress: Goal set today Pt will Go Up / Down Stairs: 6-9 stairs;with min assist;with least restrictive assistive device PT Goal: Up/Down Stairs - Progress: Goal set today  Visit Information  Last PT Received On: 11/13/12 Assistance Needed: +2    Subjective Data  Subjective: I'm just so cold and tired, I need to lay down Patient Stated Goal: Resume previous lifestyle with decreased pain   Cognition  Cognition Arousal/Alertness: Awake/alert Behavior During Therapy: WFL for tasks assessed/performed Overall Cognitive Status: Within Functional Limits for tasks assessed    Balance     End of Session PT - End of Session Equipment Utilized During Treatment: Gait belt Activity Tolerance: Patient limited by pain;Patient limited by fatigue Patient left: in bed;with call bell/phone within reach;with family/visitor present Nurse Communication: Mobility status   GP     Azarian Starace 11/13/2012, 4:17 PM

## 2012-11-13 NOTE — Plan of Care (Signed)
Problem: Consults Goal: Diagnosis- Total Joint Replacement Primary Total Hip Left Hip replacement 11/12/2012

## 2012-11-14 LAB — CBC
HCT: 21.5 % — ABNORMAL LOW (ref 36.0–46.0)
Hemoglobin: 7.2 g/dL — ABNORMAL LOW (ref 12.0–15.0)
MCH: 27.8 pg (ref 26.0–34.0)
MCHC: 33.5 g/dL (ref 30.0–36.0)
MCV: 83 fL (ref 78.0–100.0)
Platelets: 136 10*3/uL — ABNORMAL LOW (ref 150–400)
RBC: 2.59 MIL/uL — ABNORMAL LOW (ref 3.87–5.11)
RDW: 14.7 % (ref 11.5–15.5)
WBC: 7.6 10*3/uL (ref 4.0–10.5)

## 2012-11-14 LAB — GLUCOSE, CAPILLARY
Glucose-Capillary: 141 mg/dL — ABNORMAL HIGH (ref 70–99)
Glucose-Capillary: 137 mg/dL — ABNORMAL HIGH (ref 70–99)
Glucose-Capillary: 120 mg/dL — ABNORMAL HIGH (ref 70–99)
Glucose-Capillary: 135 mg/dL — ABNORMAL HIGH (ref 70–99)

## 2012-11-14 LAB — PREPARE RBC (CROSSMATCH)

## 2012-11-14 MED ORDER — HYDROCODONE-ACETAMINOPHEN 7.5-325 MG PO TABS: 1.0000 | ORAL_TABLET | ORAL | Status: DC | PRN

## 2012-11-14 MED ORDER — HYDROCODONE-ACETAMINOPHEN 7.5-325 MG PO TABS
1.0000 | ORAL_TABLET | ORAL | Status: DC | PRN
  Administered 2012-11-14 – 2012-11-16 (×7): 1 via ORAL
  Filled 2012-11-14 (×7): qty 1

## 2012-11-14 NOTE — Progress Notes (Signed)
Physical Therapy Treatment Patient Details Name: [name] MRN: [id number] DOB: 12/05/1945 Today's Date: 11/14/2012 Time: 4098-1191 PT Time Calculation (min): 38 min  PT Assessment / Plan / Recommendation Comments on Treatment Session  Pt very fagtigued this afternoon and unable to ambulate as far or perform full exercise program.      Follow Up Recommendations  Home health PT     Does the patient have the potential to tolerate intense rehabilitation     Barriers to Discharge        Equipment Recommendations  None recommended by PT    Recommendations for Other Services    Frequency 7X/week   Plan Discharge plan remains appropriate    Precautions / Restrictions Precautions Precautions: Posterior Hip;Fall Precaution Booklet Issued: Yes (comment) Precaution Comments: Pt recalls all THP without cues Restrictions Weight Bearing Restrictions: Yes LLE Weight Bearing: Weight bearing as tolerated   Pertinent Vitals/Pain 6/10 pain    Mobility  Bed Mobility Bed Mobility: Supine to Sit;Sit to Supine (Pt in recliner when PT arrived. ) Supine to Sit: 4: Min assist;With rails Sit to Supine: 3: Mod assist;HOB flat Details for Bed Mobility Assistance: Husband able to assist pts LLE out of bed, however requires assist for BLEs into bed with cues for technique and adjusting hips once in bed.  Transfers Transfers: Sit to Stand;Stand to Sit Sit to Stand: 4: Min assist;With upper extremity assist;From bed;From chair/3-in-1;With armrests Stand to Sit: 4: Min assist;With upper extremity assist;With armrests;To chair/3-in-1;To bed Details for Transfer Assistance: Performed x 2 to use 3in1 in restroom. Assist to rise, steady and ensure controlled descent with cues for hand placement and LE management to maitnain THP when sitting/standing.  Ambulation/Gait Ambulation/Gait Assistance: 4: Min assist Ambulation Distance (Feet): 65 Feet Assistive device: Rolling walker Ambulation/Gait Assistance  Details: Continue to provide cues for sequencing/technique as pt tends to push RW too far ahead of her.   Gait Pattern: Step-to pattern;Decreased step length - right;Decreased step length - left;Decreased stance time - left Gait velocity: slow    Exercises Total Joint Exercises Quad Sets: AROM;10 reps;Both;Supine Heel Slides: AAROM;Left;10 reps   PT Diagnosis:    PT Problem List:   PT Treatment Interventions:     PT Goals Acute Rehab PT Goals PT Goal Formulation: With patient Time For Goal Achievement: 11/17/12 Potential to Achieve Goals: Good Pt will go Supine/Side to Sit: with supervision PT Goal: Supine/Side to Sit - Progress: Progressing toward goal Pt will go Sit to Supine/Side: with supervision PT Goal: Sit to Supine/Side - Progress: Progressing toward goal Pt will go Sit to Stand: with supervision PT Goal: Sit to Stand - Progress: Progressing toward goal Pt will go Stand to Sit: with supervision PT Goal: Stand to Sit - Progress: Progressing toward goal Pt will Ambulate: 51 - 150 feet;with supervision;with rolling walker PT Goal: Ambulate - Progress: Progressing toward goal  Visit Information  Last PT Received On: 11/14/12 Assistance Needed: +1    Subjective Data  Subjective: I'm so worn out.  Patient Stated Goal: Resume previous lifestyle with decreased pain   Cognition  Cognition Arousal/Alertness: Awake/alert Behavior During Therapy: WFL for tasks assessed/performed Overall Cognitive Status: Within Functional Limits for tasks assessed    Balance     End of Session PT - End of Session Equipment Utilized During Treatment: Gait belt Activity Tolerance: Patient tolerated treatment well;Patient limited by fatigue Patient left: in chair;with call bell/phone within reach;with family/visitor present Nurse Communication: Mobility status   GP  Vista Deck 11/14/2012, 5:18 PM

## 2012-11-14 NOTE — Progress Notes (Signed)
Physical Therapy Treatment Patient Details Name: [name] MRN: [id number] DOB: 09/30/1945 Today's Date: 11/14/2012 Time: 7829-5621 PT Time Calculation (min): 23 min  PT Assessment / Plan / Recommendation Comments on Treatment Session  Pt progressing well with increased amb distance today, however she does c/o fatigue in shoulders following ambulation.     Follow Up Recommendations  Home health PT     Does the patient have the potential to tolerate intense rehabilitation     Barriers to Discharge        Equipment Recommendations  None recommended by PT    Recommendations for Other Services    Frequency 7X/week   Plan Discharge plan remains appropriate    Precautions / Restrictions Precautions Precautions: Posterior Hip;Fall Precaution Booklet Issued: Yes (comment) Precaution Comments: Pt recalls all THP without cues Restrictions Weight Bearing Restrictions: Yes LLE Weight Bearing: Weight bearing as tolerated   Pertinent Vitals/Pain 7/10 pain, RN made aware, ice pack given.     Mobility  Bed Mobility Bed Mobility: Not assessed (Pt in recliner when PT arrived. ) Transfers Transfers: Sit to Stand;Stand to Sit Sit to Stand: 4: Min assist;With upper extremity assist;With armrests;From chair/3-in-1 Stand to Sit: 4: Min assist;With upper extremity assist;With armrests;To chair/3-in-1 Details for Transfer Assistance: Assist to rise, steady and ensure controlled descent with cues for hand placement and LE management to maitnain THP when sitting/standing.  Ambulation/Gait Ambulation/Gait Assistance: 4: Min assist Ambulation Distance (Feet): 85 Feet Assistive device: Rolling walker Ambulation/Gait Assistance Details: Cues for sequencing/technique with RW, esp when making turns towards L side to maintain THP.  Gait Pattern: Step-to pattern;Decreased step length - right;Decreased step length - left;Decreased stance time - left Gait velocity: slow    Exercises     PT  Diagnosis:    PT Problem List:   PT Treatment Interventions:     PT Goals Acute Rehab PT Goals PT Goal Formulation: With patient Time For Goal Achievement: 11/17/12 Potential to Achieve Goals: Good Pt will go Sit to Stand: with supervision PT Goal: Sit to Stand - Progress: Progressing toward goal Pt will go Stand to Sit: with supervision PT Goal: Stand to Sit - Progress: Progressing toward goal Pt will Ambulate: 51 - 150 feet;with supervision;with rolling walker PT Goal: Ambulate - Progress: Progressing toward goal  Visit Information  Last PT Received On: 11/14/12 Assistance Needed: +1    Subjective Data  Subjective: I know I have to do the stairs before I leave.  Patient Stated Goal: Resume previous lifestyle with decreased pain   Cognition  Cognition Arousal/Alertness: Awake/alert Behavior During Therapy: WFL for tasks assessed/performed Overall Cognitive Status: Within Functional Limits for tasks assessed    Balance     End of Session PT - End of Session Equipment Utilized During Treatment: Gait belt Activity Tolerance: Patient tolerated treatment well;Patient limited by fatigue Patient left: in chair;with call bell/phone within reach;with family/visitor present Nurse Communication: Mobility status   GP     Vista Deck 11/14/2012, 9:24 AM

## 2012-11-14 NOTE — Anesthesia Postprocedure Evaluation (Signed)
  Anesthesia Post-op Note  Patient: [name]  Procedure(s) Performed: Procedure(s) with comments: TOTAL HIP ARTHROPLASTY (Left) - DEPUY PINNACLE  Patient Location: Nursing Unit  Anesthesia Type:General  Level of Consciousness: awake, alert , oriented and patient cooperative  Airway and Oxygen Therapy: Patient Spontanous Breathing  Post-op Pain: mild  Post-op Assessment: Post-op Vital signs reviewed, Patient's Cardiovascular Status Stable, Respiratory Function Stable, Patent Airway, No signs of Nausea or vomiting, Adequate PO intake and Pain level controlled  Post-op Vital Signs: Reviewed and stable  Complications: No apparent anesthesia complications

## 2012-11-14 NOTE — Evaluation (Addendum)
Occupational Therapy Evaluation Patient Details Name: [name] MRN: [id number] DOB: 1945-07-12 Today's Date: 11/14/2012 Time: 7829-5621 OT Time Calculation (min): 25 min  OT Assessment / Plan / Recommendation Clinical Impression    Pt s/p L THR presents with below problem list. Pt will benefit from acute OT to increase independence prior to d/c. Pt planning to d/c home with spouse.   OT Assessment  Patient needs continued OT Services    Follow Up Recommendations  Home health OT;Supervision/Assistance - 24 hour    Barriers to Discharge      Equipment Recommendations  None recommended by OT    Recommendations for Other Services    Frequency  Min 2X/week    Precautions / Restrictions Precautions Precautions: Posterior Hip;Fall Precaution Booklet Issued: No Precaution Comments: Pt recalls all THP without cues Restrictions Weight Bearing Restrictions: Yes LLE Weight Bearing: Weight bearing as tolerated   Pertinent Vitals/Pain 7/10 pain. RN made aware, ice pack given.    ADL  Eating/Feeding: Independent Where Assessed - Eating/Feeding: Chair Grooming: Set up Where Assessed - Grooming: Supported sitting Upper Body Bathing: Set up;Supervision/safety Where Assessed - Upper Body Bathing: Unsupported sitting Lower Body Bathing: Moderate assistance Where Assessed - Lower Body Bathing: Supported sit to stand Upper Body Dressing: Set up;Supervision/safety Where Assessed - Upper Body Dressing: Supported sitting Lower Body Dressing: Moderate assistance Where Assessed - Lower Body Dressing: Supported sit to Pharmacist, hospital: Mining engineer Method: Sit to Barista: Other (comment) (from recliner chair) Tub/Shower Transfer Method: Not assessed Equipment Used: Gait belt;Rolling walker;Sock aid;Reacher;Long-handled sponge;Long-handled shoe horn Transfers/Ambulation Related to ADLs: Min A for ambulation and transfers ADL  Comments: Pt at overall Mod A for LB ADLs. OT showed pt hip kit and pt verified she has equipment.  Pt tired during session as she is planning to receive blood today.    OT Diagnosis: Acute pain  OT Problem List: Decreased strength;Decreased activity tolerance;Impaired balance (sitting and/or standing);Decreased knowledge of use of DME or AE;Decreased knowledge of precautions;Pain;Decreased range of motion OT Treatment Interventions: Self-care/ADL training;DME and/or AE instruction;Therapeutic activities;Patient/family education;Balance training   OT Goals Acute Rehab OT Goals OT Goal Formulation: With patient Time For Goal Achievement: 11/21/12 Potential to Achieve Goals: Good ADL Goals Pt Will Perform Grooming: with modified independence;Standing at sink ADL Goal: Grooming - Progress: Goal set today Pt Will Perform Lower Body Bathing: with modified independence;Sit to stand from chair;with adaptive equipment ADL Goal: Lower Body Bathing - Progress: Goal set today Pt Will Perform Lower Body Dressing: with modified independence;Sit to stand from bed;Sit to stand from chair;with adaptive equipment ADL Goal: Lower Body Dressing - Progress: Goal set today Pt Will Transfer to Toilet: with modified independence;Ambulation;with DME ADL Goal: Toilet Transfer - Progress: Goal set today Pt Will Perform Toileting - Clothing Manipulation: with modified independence;Standing ADL Goal: Toileting - Clothing Manipulation - Progress: Goal set today Pt Will Perform Toileting - Hygiene: with modified independence;Sit to stand from 3-in-1/toilet;Sitting on 3-in-1 or toilet ADL Goal: Toileting - Hygiene - Progress: Goal set today Pt Will Perform Tub/Shower Transfer: Shower transfer;with supervision;Ambulation;with DME ADL Goal: Tub/Shower Transfer - Progress: Goal set today Miscellaneous OT Goals Miscellaneous OT Goal #1: Pt will be able to independently demonstrate 3/3 hip precautions. OT Goal:  Miscellaneous Goal #1 - Progress: Goal set today  Visit Information  Last OT Received On: 11/14/12 Assistance Needed: +1 PT/OT Co-Evaluation/Treatment: Yes    Subjective Data      Prior Functioning  Home Living Lives With: Spouse Available Help at Discharge: Family;Available 24 hours/day Type of Home: House Home Access: Stairs to enter [employer] of Steps: 6-8 Entrance Stairs-Rails: Right;Left Home Layout: One level Bathroom Shower/Tub: Walk-in shower;Door [employer] Toilet: Handicapped height Bathroom Accessibility: Yes How Accessible: Accessible via walker Home Adaptive Equipment: Reacher;Sock aid;Walker - rolling;Bedside commode/3-in-1;Straight cane;Shower chair with back;Long-handled shoehorn;Long-handled sponge Prior Function Level of Independence: Independent with assistive device(s) (cane) Able to Take Stairs?: Yes Driving: Yes Vocation: Retired Musician: No difficulties Dominant Hand: Right         Vision/Perception     Cognition  Cognition Arousal/Alertness: Awake/alert Behavior During Therapy: WFL for tasks assessed/performed Overall Cognitive Status: Within Functional Limits for tasks assessed    Extremity/Trunk Assessment Right Upper Extremity Assessment RUE ROM/Strength/Tone: WFL for tasks assessed Left Upper Extremity Assessment LUE ROM/Strength/Tone: WFL for tasks assessed     Mobility Bed Mobility Bed Mobility: Not assessed (Pt in recliner when OT arrived) Transfers Transfers: Sit to Stand;Stand to Sit Sit to Stand: 4: Min assist;With upper extremity assist;With armrests;From chair/3-in-1 Stand to Sit: 4: Min assist;With upper extremity assist;With armrests;To chair/3-in-1 Details for Transfer Assistance: Assist to rise, steady and ensure controlled descent with cues for hand placement and LE management to maitnain THP when sitting/standing.      Exercise     Balance     End of Session OT - End of  Session Equipment Utilized During Treatment: Gait belt Activity Tolerance: Patient tolerated treatment well;Patient limited by fatigue Patient left: in chair;with call bell/phone within reach;with family/visitor present Nurse Communication: Patient requests pain meds  GO     Earlie Raveling OTR/L [phone] 11/14/2012, 9:57 AM

## 2012-11-14 NOTE — Progress Notes (Addendum)
PATIENT ID: [name]  MRN: [id number]  DOB/AGE:  03-01-1946 / 67 y.o.  2 Days Post-Op Procedure(s) (LRB): TOTAL HIP ARTHROPLASTY (Left)    PROGRESS NOTE Subjective: Patient is alert, oriented,no Nausea, no Vomiting, yes passing gas, no Bowel Movement. Taking PO well.  Complains of dizziness and fatigue. Denies SOB, Chest or Calf Pain. Using Incentive Spirometer, PAS in place. Ambulating slowly with PT. Patient reports pain as moderate  .    Objective: Vital signs in last 24 hours: Filed Vitals:   11/13/12 2217 11/14/12 0000 11/14/12 0400 11/14/12 0656  BP: 120/54   130/76  Pulse: 97   95  Temp: 98.7 F (37.1 C)   97.1 F (36.2 C)  TempSrc:      Resp: 18 18 20 18   Height:      Weight:      SpO2: 97%   99%      Intake/Output from previous day: I/O last 3 completed shifts: In: 3770 [P.O.:1320; I.V.:2450] Out: 700 [Urine:700]   Intake/Output this shift:     LABORATORY DATA:  Recent Labs  11/13/12 0430  11/13/12 1616 11/13/12 2211 11/14/12 0450 11/14/12 0642  WBC 8.3  --   --   --  7.6  --   HGB 9.2*  --   --   --  7.2*  --   HCT 27.4*  --   --   --  21.5*  --   PLT 208  --   --   --  136*  --   NA 137  --   --   --   --   --   K 4.4  --   --   --   --   --   CL 107  --   --   --   --   --   CO2 22  --   --   --   --   --   BUN 12  --   --   --   --   --   CREATININE 0.64  --   --   --   --   --   GLUCOSE 164*  --   --   --   --   --   GLUCAP  --   < > 153* 160*  --  141*  CALCIUM 8.0*  --   --   --   --   --   < > = values in this interval not displayed.  Examination: Neurologically intact ABD soft Neurovascular intact Sensation intact distally Intact pulses distally Dorsiflexion/Plantar flexion intact Incision: dressing C/D/I} XR AP&Lat of hip shows well placed\fixed THA  Assessment:   2 Days Post-Op Procedure(s) (LRB): TOTAL HIP ARTHROPLASTY (Left) ADDITIONAL DIAGNOSIS:  Acute Blood Loss Anemia  Plan: PT/OT WBAT, THA  posterior  precautions  Transfuse 2 units PRBC today.    Will decrease norco to 1 pill q 4-6 hrs prn  DVT Prophylaxis: SCDx72 hrs, ASA 325 mg BID x 2 weeks  DISCHARGE PLAN: Home today or tomorrow if PT goals met.  DISCHARGE NEEDS: HHPT, HHRN, Walker and 3-in-1 comode seat

## 2012-11-15 LAB — CBC
HCT: 25 % — ABNORMAL LOW (ref 36.0–46.0)
Hemoglobin: 8.6 g/dL — ABNORMAL LOW (ref 12.0–15.0)
MCH: 28.9 pg (ref 26.0–34.0)
MCHC: 34.4 g/dL (ref 30.0–36.0)
MCV: 83.9 fL (ref 78.0–100.0)
Platelets: 142 10*3/uL — ABNORMAL LOW (ref 150–400)
RBC: 2.98 MIL/uL — ABNORMAL LOW (ref 3.87–5.11)
RDW: 14.7 % (ref 11.5–15.5)
WBC: 7.4 10*3/uL (ref 4.0–10.5)

## 2012-11-15 LAB — TYPE AND SCREEN
ABO/RH(D): A NEG
Antibody Screen: NEGATIVE
Unit division: 0
Unit division: 0

## 2012-11-15 LAB — GLUCOSE, CAPILLARY
Glucose-Capillary: 148 mg/dL — ABNORMAL HIGH (ref 70–99)
Glucose-Capillary: 118 mg/dL — ABNORMAL HIGH (ref 70–99)
Glucose-Capillary: 139 mg/dL — ABNORMAL HIGH (ref 70–99)
Glucose-Capillary: 168 mg/dL — ABNORMAL HIGH (ref 70–99)

## 2012-11-15 NOTE — Progress Notes (Signed)
PT PROGRESS NOTE  11/15/12 1414  PT Visit Information  Last PT Received On 11/15/12  Assistance Needed +1 (second person to bring recliner)  PT Time Calculation  PT Start Time 1344  PT Stop Time 1410  PT Time Calculation (min) 26 min  Subjective Data  Subjective "I feel bad"    Precautions  Precautions Posterior Hip;Fall  Precaution Comments Cues for pt to maintain precautions during session. Able to verbalize all of them.  Restrictions  Weight Bearing Restrictions Yes  LLE Weight Bearing WBAT  Cognition  Arousal/Alertness Awake/alert  Behavior During Therapy WFL for tasks assessed/performed  Overall Cognitive Status Within Functional Limits for tasks assessed  Bed Mobility  Bed Mobility Supine to Sit;Sit to Supine  Supine to Sit 4: Min assist;With rails  Sit to Supine 3: Mod assist  Details for Bed Mobility Assistance Pt. again assisting pt. OOB with min assist to manage LLE.  On return to bed, pt. needed cueing for hip precautions and husband to assist while observing hip precautions.  Transfers  Transfers Sit to Stand;Stand to Sit  Sit to Stand 4: Min guard;With upper extremity assist;From bed  Stand to Sit 4: Min guard;With upper extremity assist;To bed  Details for Transfer Assistance min guard for safety and cues for L LE postioning  Ambulation/Gait  Ambulation/Gait Assistance 4: Min guard  Ambulation Distance (Feet) 80 Feet (40x2)  Assistive device Rolling walker  Ambulation/Gait Assistance Details pt. appeared fatigued but  wanted to walk.  She needed cueing occasionally for sequencing and technique.Overall less tolerance for activity this pm.  Gait Pattern Step-through pattern;Decreased step length - right;Decreased step length - left  Gait velocity slow  Stairs No  PT - End of Session  Equipment Utilized During Treatment Gait belt  Activity Tolerance Patient tolerated treatment well;Patient limited by fatigue  Patient left in bed;with call bell/phone within  reach;with family/visitor present  Nurse Communication Mobility status  PT - Assessment/Plan  Comments on Treatment Session Pt. not feeling as well this pm and therapist did not want her to try steps due to her feeling worse.  Recommend that she stay this evening and will attempt steps again in the am in preparation for DC home.    Pt. and her husband are in agreement.  PT Plan Discharge plan remains appropriate  PT Frequency 7X/week  Follow Up Recommendations Home health PT  PT equipment None recommended by PT  Acute Rehab PT Goals  Pt will go Supine/Side to Sit with supervision  PT Goal: Supine/Side to Sit - Progress Progressing toward goal  Pt will go Sit to Supine/Side with supervision  PT Goal: Sit to Supine/Side - Progress Progressing toward goal  Pt will go Sit to Stand with supervision  PT Goal: Sit to Stand - Progress Progressing toward goal  Pt will go Stand to Sit with supervision  PT Goal: Stand to Sit - Progress Progressing toward goal  Pt will Ambulate 51 - 150 feet;with supervision;with rolling walker  PT Goal: Ambulate - Progress Progressing toward goal  PT General Charges  $$ ACUTE PT VISIT 1 Procedure  PT Treatments  $Gait Training 23-37 mins  Weldon Picking PT Acute Rehab Services [phone] Beeper [phone]

## 2012-11-15 NOTE — Progress Notes (Signed)
Physical Therapy Treatment Patient Details Name: [name] MRN: [id number] DOB: 1946/03/08 Today's Date: 11/15/2012 Time: 7829-5621 PT Time Calculation (min): 29 min  PT Assessment / Plan / Recommendation Comments on Treatment Session  Pt. making progress with mobility but she does not yet have confidence in ascending and descending steps.  Needs more practice.  Will see how she does this afternoon before recommending DC home.    Follow Up Recommendations  Home health PT     Does the patient have the potential to tolerate intense rehabilitation     Barriers to Discharge        Equipment Recommendations  None recommended by PT    Recommendations for Other Services OT consult  Frequency 7X/week   Plan Discharge plan remains appropriate    Precautions / Restrictions Precautions Precautions: Posterior Hip;Fall Precaution Booklet Issued: No Precaution Comments: pt. able to maintain precautions without prompting Restrictions Weight Bearing Restrictions: Yes LLE Weight Bearing: Weight bearing as tolerated   Pertinent Vitals/Pain See vitals tab     Mobility  Bed Mobility Bed Mobility: Not assessed (pt. in recliner) Transfers Transfers: Sit to Stand;Stand to Sit Sit to Stand: 4: Min guard;With upper extremity assist;With armrests;From chair/3-in-1 Stand to Sit: 4: Min guard;With upper extremity assist;With armrests;To chair/3-in-1 Details for Transfer Assistance: using own power to rise and control descent now, min guard for safety Ambulation/Gait Ambulation/Gait Assistance: 4: Min guard Ambulation Distance (Feet): 140 Feet (70x2) Assistive device: Rolling walker Ambulation/Gait Assistance Details: min guard for safety; good sequencing but early fatigue with need of standing rest breaks along the way Gait Pattern: Step-through pattern;Decreased step length - right;Decreased step length - left Gait velocity: slow Stairs: Yes Stairs Assistance: 4: Min assist Stairs  Assistance Details (indicate cue type and reason): Pt. needed prompting to go up with "good" and down with "surgical" leg and relied heavily on left rail to ascend steps.  she had mild dizziness while up at top of 3 steps but did not interfere with her being safely able to descend to bottom of steps.  She does not have confidence in herself in doing steps.  Needs to practice the steps again in pm.   Stair Management Technique: One rail Left;Step to pattern;Forwards Number of Stairs: 3    Exercises Total Joint Exercises Ankle Circles/Pumps: AROM;15 reps;Seated Quad Sets: AROM;10 reps;Both Long Arc Quad: AROM;Left;10 reps;Seated   PT Diagnosis:    PT Problem List:   PT Treatment Interventions:     PT Goals Acute Rehab PT Goals PT Goal Formulation: With patient Pt will go Sit to Stand: with supervision PT Goal: Sit to Stand - Progress: Progressing toward goal Pt will go Stand to Sit: with supervision PT Goal: Stand to Sit - Progress: Progressing toward goal Pt will Ambulate: 51 - 150 feet;with supervision;with rolling walker PT Goal: Ambulate - Progress: Progressing toward goal Pt will Go Up / Down Stairs: 6-9 stairs;with min assist;with least restrictive assistive device PT Goal: Up/Down Stairs - Progress: Progressing toward goal  Visit Information  Last PT Received On: 11/15/12 Assistance Needed: +1 (second person for steps due to pt. dizziness)    Subjective Data  Subjective: "I'm not doing too well with this" (refering to walking)   Cognition  Cognition Arousal/Alertness: Awake/alert Behavior During Therapy: WFL for tasks assessed/performed Overall Cognitive Status: Within Functional Limits for tasks assessed    Balance     End of Session PT - End of Session Equipment Utilized During Treatment: Gait belt Activity Tolerance: Patient  tolerated treatment well;Patient limited by fatigue Patient left: in chair;with call bell/phone within reach;with family/visitor present Nurse  Communication: Mobility status   GP     Ferman Hamming 11/15/2012, 11:44 AM Weldon Picking PT Acute Rehab Services [phone] Beeper [phone]

## 2012-11-15 NOTE — Progress Notes (Signed)
Patient ID: [name], female   DOB: 11-Jul-1945, 67 y.o.   MRN: [id number] PATIENT ID: [name]  MRN: [id number]  DOB/AGE:  December 05, 1945 / 67 y.o.  3 Days Post-Op Procedure(s) (LRB): TOTAL HIP ARTHROPLASTY (Left)    PROGRESS NOTE Subjective: Patient is alert, oriented,no Nausea, no Vomiting, yes passing gas, no Bowel Movement. Taking PO well. Denies SOB, Chest or Calf Pain. Using Incentive Spirometer, PAS in place. Ambulate WBAT in room Patient reports pain as 3 on 0-10 scale , feels better after 2 unit PRBC .    Objective: Vital signs in last 24 hours: Filed Vitals:   11/14/12 2000 11/14/12 2157 11/15/12 0000 11/15/12 0500  BP:  147/93  117/70  Pulse:  107  87  Temp:  98.4 F (36.9 C)  99.4 F (37.4 C)  TempSrc:  Oral  Oral  Resp: 18 18 20 19   Height:      Weight:      SpO2:  98%  98%      Intake/Output from previous day: I/O last 3 completed shifts: In: 2620 [P.O.:1320; I.V.:950; Blood:350] Out: 150 [Urine:150]   Intake/Output this shift:     LABORATORY DATA:  Recent Labs  11/13/12 0430  11/14/12 0450  11/14/12 1616 11/14/12 2133 11/15/12 0530 11/15/12 0653  WBC 8.3  --  7.6  --   --   --  7.4  --   HGB 9.2*  --  7.2*  --   --   --  8.6*  --   HCT 27.4*  --  21.5*  --   --   --  25.0*  --   PLT 208  --  136*  --   --   --  142*  --   NA 137  --   --   --   --   --   --   --   K 4.4  --   --   --   --   --   --   --   CL 107  --   --   --   --   --   --   --   CO2 22  --   --   --   --   --   --   --   BUN 12  --   --   --   --   --   --   --   CREATININE 0.64  --   --   --   --   --   --   --   GLUCOSE 164*  --   --   --   --   --   --   --   GLUCAP  --   < >  --   < > 120* 135*  --  148*  CALCIUM 8.0*  --   --   --   --   --   --   --   < > = values in this interval not displayed.  Examination: Neurologically intact ABD soft Neurovascular intact Sensation intact distally Intact pulses distally Dorsiflexion/Plantar flexion intact Incision: no  drainage No cellulitis present Compartment soft} XR AP&Lat of hip shows well placed\fixed THA  Assessment:   3 Days Post-Op Procedure(s) (LRB): TOTAL HIP ARTHROPLASTY (Left) ADDITIONAL DIAGNOSIS:  Diabetes, Hypertension and asthma, dizziness (chronic)  Plan: PT/OT WBAT, THA  posterior precautions  DVT Prophylaxis: SCDx72 hrs, ASA 325 mg BID x 2 weeks  DISCHARGE PLAN: Home, when passes PT  DISCHARGE NEEDS: HHPT, HHRN, CPM, Walker and 3-in-1 comode seat

## 2012-11-15 NOTE — Progress Notes (Signed)
Occupational Therapy Treatment Patient Details Name: [name] MRN: [id number] DOB: [birth year] Today's Date: 11/15/2012 Time: 0829-0900 OT Time Calculation (min): 31 min  OT Assessment / Plan / Recommendation Comments on Treatment Session Pt progressing towards goals. Practiced simulated shower transfer, LB dressing, and toileting. Cues to maintain precautions.    Follow Up Recommendations  Home health OT;Supervision/Assistance - 24 hour    Barriers to Discharge       Equipment Recommendations  None recommended by OT    Recommendations for Other Services    Frequency Min 2X/week   Plan Discharge plan remains appropriate    Precautions / Restrictions Precautions Precautions: Posterior Hip;Fall Precaution Booklet Issued: No Precaution Comments: Cues for pt to maintain precautions during session. Able to verbalize all of them. Restrictions Weight Bearing Restrictions: Yes LLE Weight Bearing: Weight bearing as tolerated   Pertinent Vitals/Pain Pain 4/10. Repositioned.     ADL  Grooming: Performed;Wash/dry hands;Min guard Where Assessed - Grooming: Unsupported standing Lower Body Dressing: Performed;Minimal assistance Where Assessed - Lower Body Dressing: Supported sit to Pharmacist, hospital: Hydrographic surveyor Method: Sit to Barista: Raised toilet seat with arms (or 3-in-1 over toilet) Toileting - Clothing Manipulation and Hygiene: Min guard Where Assessed - Toileting Clothing Manipulation and Hygiene: Sit to stand from 3-in-1 or toilet Tub/Shower Transfer: Simulated;Minimal assistance Tub/Shower Transfer Method: Science writer: Walk in shower;Other (comment) (3 in 1  ) Equipment Used: Gait belt;Rolling walker;Reacher ADL Comments: Pt practiced donning underwear with reacher. Pt at Min A level and required several cues to maintain precautions. Educated husband to be sure she maintains precautions when dressing at  home. Also, gave cues for pt to maintain precautions during clothing management. Min A for shower transfer for technique and maneuvering walker.    OT Diagnosis:    OT Problem List:   OT Treatment Interventions:     OT Goals Acute Rehab OT Goals OT Goal Formulation: With patient Time For Goal Achievement: 11/21/12 Potential to Achieve Goals: Good ADL Goals Pt Will Perform Grooming: with modified independence;Standing at sink ADL Goal: Grooming - Progress: Progressing toward goals Pt Will Perform Lower Body Bathing: with modified independence;Sit to stand from chair;with adaptive equipment Pt Will Perform Lower Body Dressing: with modified independence;Sit to stand from bed;Sit to stand from chair;with adaptive equipment ADL Goal: Lower Body Dressing - Progress: Progressing toward goals Pt Will Transfer to Toilet: with modified independence;Ambulation;with DME ADL Goal: Toilet Transfer - Progress: Progressing toward goals Pt Will Perform Toileting - Clothing Manipulation: with modified independence;Standing ADL Goal: Toileting - Clothing Manipulation - Progress: Progressing toward goals Pt Will Perform Toileting - Hygiene: with modified independence;Sit to stand from 3-in-1/toilet;Sitting on 3-in-1 or toilet ADL Goal: Toileting - Hygiene - Progress: Progressing toward goals Pt Will Perform Tub/Shower Transfer: Shower transfer;with supervision;Ambulation;with DME ADL Goal: Tub/Shower Transfer - Progress: Progressing toward goals Miscellaneous OT Goals Miscellaneous OT Goal #1: Pt will be able to independently demonstrate 3/3 hip precautions. OT Goal: Miscellaneous Goal #1 - Progress: Progressing toward goals  Visit Information  Last OT Received On: 11/15/12 Assistance Needed: +1    Subjective Data      Prior Functioning       Cognition  Cognition Arousal/Alertness: Awake/alert Behavior During Therapy: WFL for tasks assessed/performed Overall Cognitive Status: Within  Functional Limits for tasks assessed    Mobility  Bed Mobility Bed Mobility: Not assessed Transfers Transfers: Sit to Stand;Stand to Sit Sit to Stand: 4: Min guard;With upper extremity  assist;From chair/3-in-1 Stand to Sit: 4: Min guard;With upper extremity assist;To chair/3-in-1 Details for Transfer Assistance: Minguard for safety. Cues for positioning of LLE when sitting/standing.       Balance     End of Session OT - End of Session Equipment Utilized During Treatment: Gait belt Activity Tolerance: Patient tolerated treatment well;Patient limited by fatigue Patient left: in chair;with call bell/phone within reach;with family/visitor present Nurse Communication: Patient requests pain meds;Mobility status  GO     Earlie Raveling OTR/L [phone] 11/15/2012, 12:29 PM

## 2012-11-15 NOTE — Care Management Note (Signed)
CARE MANAGEMENT NOTE 11/15/2012  Patient:  [name], [name]   Account Number:  [id number]  Date Initiated:  11/13/2012  Documentation initiated by:  [facility]  Subjective/Objective Assessment:   left total hip arthroplasty     Action/Plan:   HH  CM spoke with patient concerning home health and DME. Choice offered. Patient has rolling walker and 3in1. Has family support at discharge.   Anticipated DC Date:  11/16/2012   Anticipated DC Plan:  HOME W HOME HEALTH SERVICES      DC Planning Services  CM consult      [facility] Choice  HOME HEALTH   Choice offered to / List presented to:  C-1 Patient      DME agency  [employer]     HH arranged  HH-2 PT      [facility] agency  [facility]   Status of service:  Completed, signed off Medicare Important Message given?   (If response is "NO", the following Medicare IM given date fields will be blank) Date Medicare IM given:   Date Additional Medicare IM given:    Discharge Disposition:  HOME W HOME HEALTH SERVICES  Per UR Regulation:  Reviewed for med. necessity/level of care/duration of stay  If discussed at Long Length of Stay Meetings, dates discussed:    Comments:

## 2012-11-16 ENCOUNTER — Ambulatory Visit (INDEPENDENT_AMBULATORY_CARE_PROVIDER_SITE_OTHER): Payer: 59 | Admitting: Ophthalmology

## 2012-11-16 LAB — GLUCOSE, CAPILLARY: Glucose-Capillary: 130 mg/dL — ABNORMAL HIGH (ref 70–99)

## 2012-11-16 LAB — POCT I-STAT 4, (NA,K, GLUC, HGB,HCT)
Glucose, Bld: 130 mg/dL — ABNORMAL HIGH (ref 70–99)
HCT: 27 % — ABNORMAL LOW (ref 36.0–46.0)
Hemoglobin: 9.2 g/dL — ABNORMAL LOW (ref 12.0–15.0)
Potassium: 3.7 mEq/L (ref 3.5–5.1)
Sodium: 142 mEq/L (ref 135–145)

## 2012-11-16 NOTE — Progress Notes (Signed)
Physical Therapy Treatment Patient Details Name: [name] MRN: [id number] DOB: 08-30-1945 Today's Date: 11/16/2012 Time: 4098-1191 PT Time Calculation (min): 29 min  PT Assessment / Plan / Recommendation Comments on Treatment Session  Pt. appears as though she feels much better this am and is in verbal agreement.  She was able to walk 150 at mod I level and negotiate 3 steps with supervision for safety.  Her husband was present in session.  Pt. is greatly improved as compared to yersterday afternoon and is ready for Dc home when she is medically ready.    Follow Up Recommendations  Home health PT;Supervision/Assistance - 24 hour (initial 24 hour assist)     Does the patient have the potential to tolerate intense rehabilitation     Barriers to Discharge        Equipment Recommendations  None recommended by PT    Recommendations for Other Services    Frequency 7X/week   Plan Discharge plan remains appropriate    Precautions / Restrictions Precautions Precautions: Posterior Hip;Fall Precaution Booklet Issued: Yes (comment) Precaution Comments: provided handouts for surgical hip exercises and for posterior hip precautions ; checked off the exercises notated in exercise section below and asked pt. not to progress exercises further excep with HHPT Restrictions Weight Bearing Restrictions: Yes LLE Weight Bearing: Weight bearing as tolerated Other Position/Activity Restrictions: Pt. is able  to state and adhere to hip precautions   Pertinent Vitals/Pain See vitals tab     Mobility  Bed Mobility Bed Mobility: Sit to Supine Sit to Supine: 4: Min assist;HOB flat Details for Bed Mobility Assistance: min assist for managing both LEs Transfers Transfers: Sit to Stand;Stand to Sit Sit to Stand: 6: Modified independent (Device/Increase time);From bed;With upper extremity assist Stand to Sit: 6: Modified independent (Device/Increase time);With upper extremity assist;To bed Details for  Transfer Assistance: mod I level for transitions this am, using appropriate hand placement and leg placement Ambulation/Gait Ambulation/Gait Assistance: 6: Modified independent (Device/Increase time) Ambulation Distance (Feet): 150 Feet Assistive device: Rolling walker Ambulation/Gait Assistance Details: moving more freely with good sequence today and accepting more weight on L LE.   Gait Pattern: Step-through pattern Gait velocity: improving Stairs: Yes (x 3 reps) Stairs Assistance: 5: Supervision Stairs Assistance Details (indicate cue type and reason): supervision for safety but pt. knew correct technique and managed herself well negotiating steps Stair Management Technique: One rail Left;Sideways Number of Stairs: 3    Exercises Total Joint Exercises Ankle Circles/Pumps: AROM;15 reps;Seated Quad Sets: AROM;Left;15 reps;Supine Short Arc Quad: AROM;Left;15 reps;Supine Heel Slides: AROM;Left;10 reps;Supine Hip ABduction/ADduction: AAROM;10 reps;Left;Supine Long Arc Quad: AROM;Left;10 reps;Seated   PT Diagnosis:    PT Problem List:   PT Treatment Interventions:     PT Goals Acute Rehab PT Goals Pt will go Sit to Supine/Side: with supervision PT Goal: Sit to Supine/Side - Progress: Progressing toward goal Pt will go Sit to Stand: with supervision PT Goal: Sit to Stand - Progress: Met Pt will go Stand to Sit: with supervision PT Goal: Stand to Sit - Progress: Met Pt will Ambulate: 51 - 150 feet;with supervision;with rolling walker PT Goal: Ambulate - Progress: Met Pt will Go Up / Down Stairs: 6-9 stairs;with min assist;with least restrictive assistive device PT Goal: Up/Down Stairs - Progress: Met  Visit Information  Last PT Received On: 11/16/12 Assistance Needed: +1    Subjective Data  Subjective: I feel so much better today, ready to go home   Cognition  Cognition Arousal/Alertness: Awake/alert Behavior During  Therapy: WFL for tasks assessed/performed Overall  Cognitive Status: Within Functional Limits for tasks assessed    Balance     End of Session PT - End of Session Equipment Utilized During Treatment: Gait belt Activity Tolerance: Patient tolerated treatment well Patient left: in bed;with call bell/phone within reach;with family/visitor present Nurse Communication: Mobility status   GP     Ferman Hamming 11/16/2012, 8:28 AM Weldon Picking PT Acute Rehab Services [phone] [facility] [phone]

## 2012-11-19 NOTE — Discharge Summary (Signed)
Patient ID: [name] MRN: [id number] DOB/AGE: 03/07/1946 67 y.o.  Admit date: 11/12/2012 Discharge date: 11/16/2012  Admission Diagnoses:  Principal Problem:   Osteoarthritis of left hip   Discharge Diagnoses:  Same  Past Medical History  Diagnosis Date  . HYPERLIPIDEMIA 01/04/2007    taking Pravastatin daily  . Overweight(278.02) 01/04/2007  . ANXIETY 01/04/2007  . GLAUCOMA 07/30/2008  . OTITIS MEDIA, ACUTE, BILATERAL 02/29/2008  . OSTEOARTHRITIS, HIP 09/25/2009  . LEG PAIN, LEFT 07/06/2007  . Dizziness and giddiness 02/29/2008  . NUMBNESS 07/30/2008    in fingers;pt states from Diamox  . TRANSIENT ISCHEMIC ATTACK, HX OF 01/04/2007  . DVT, HX OF     at age 51 in right buttocks  . PONV (postoperative nausea and vomiting)   . GERD 01/04/2007    pt reports resolved with weight loss  . Vision loss     left eye  . History of blood transfusion     no abnormal  reaction  . HYPERTENSION 01/04/2007    takes Lisinopril daily  . Joint pain   . Joint swelling   . DIABETES MELLITUS, TYPE II 01/04/2007    only takes actoplus daily  . SLEEP APNEA, OBSTRUCTIVE     doesn't use a cpap;study done about 11yrs ago    Surgeries: Procedure(s): TOTAL HIP ARTHROPLASTY on 11/12/2012   Consultants:    Discharged Condition: Improved  Hospital Course: [name] is an 67 y.o. female who was admitted 11/12/2012 for operative treatment ofOsteoarthritis of left hip. Patient has severe unremitting pain that affects sleep, daily activities, and work/hobbies. After pre-op clearance the patient was taken to the operating room on 11/12/2012 and underwent  Procedure(s): TOTAL HIP ARTHROPLASTY.    Patient was given perioperative antibiotics:  Anti-infectives   Start     Dose/Rate Route Frequency Ordered Stop   11/12/12 0600  ceFAZolin (ANCEF) IVPB 2 g/50 mL premix     2 g 100 mL/hr over 30 Minutes Intravenous On call to O.R. 11/11/12 1314 11/12/12 1242       Patient was given sequential compression  devices, early ambulation, and chemoprophylaxis to prevent DVT.Hgb of 7.6 on 11/14/2012 with dizziness tx with 2 unit  PRBC. Hgb 11/15/2012 8.6, patient felt better and resumed PT. Passed PT 11/16/2012, dressing dry.   Patient benefited maximally from hospital stay and there were no complications.    Recent vital signs: No data found.    Recent laboratory studies: No results found for this basename: WBC, HGB, HCT, PLT, NA, K, CL, CO2, BUN, CREATININE, GLUCOSE, PT, INR, CALCIUM, 2,  in the last 72 hours   Discharge Medications:     Medication List    TAKE these medications       acetaminophen 500 MG tablet  Commonly known as:  TYLENOL  Take 1,000 mg by mouth every 6 (six) hours as needed for pain.     acetaZOLAMIDE 500 MG capsule  Commonly known as:  DIAMOX  Take 500 mg by mouth 2 (two) times daily.     aspirin EC 325 MG tablet  Take 1 tablet (325 mg total) by mouth 2 (two) times daily.     bimatoprost 0.01 % Soln  Commonly known as:  LUMIGAN  Place 1 drop into both eyes at bedtime.     COMBIGAN OP  Place 1 drop into both eyes 2 (two) times daily.     HYDROcodone-acetaminophen 7.5-325 MG per tablet  Commonly known as:  NORCO  Take 1 tablet by  mouth every 4 (four) hours as needed.     lisinopril-hydrochlorothiazide 20-12.5 MG per tablet  Commonly known as:  PRINZIDE,ZESTORETIC  Take 1 tablet by mouth daily.     methocarbamol 500 MG tablet  Commonly known as:  ROBAXIN  Take 1 tablet (500 mg total) by mouth every 6 (six) hours as needed.     pioglitazone-metformin 15-500 MG per tablet  Commonly known as:  ACTOPLUS MET  Take 1 tablet by mouth daily.     pravastatin 20 MG tablet  Commonly known as:  PRAVACHOL  Take 1 tablet (20 mg total) by mouth daily.     prednisoLONE acetate 1 % ophthalmic suspension  Commonly known as:  PRED FORTE  Place 1 drop into the right eye 4 (four) times daily.        Diagnostic Studies: Dg Chest 2 View  11/08/2012   *RADIOLOGY REPORT*   Clinical Data: Preop left hip replacement  CHEST - 2 VIEW  Comparison: 04/04/2011  Findings: Lungs are essentially clear.  No focal consolidation.  No pleural effusion or pneumothorax.  The heart is normal in size.  Mild degenerative changes of the visualized thoracolumbar spine.  IMPRESSION: No evidence of acute cardiopulmonary disease.   Original Report Authenticated By: Charline Bills, M.D.   Dg Pelvis Portable  11/12/2012   *RADIOLOGY REPORT*  Clinical Data: Total left hip arthroplasty.  PORTABLE PELVIS  Comparison: 06/24/2011.  Findings: The femoral and acetabular components are well seated. No complicating features are demonstrated.  The pubic symphysis and SI joints are intact.  The right hip prosthesis is stable.  IMPRESSION: Well seated components of a total left hip arthroplasty without complicating features.   Original Report Authenticated By: Rudie Meyer, M.D.   Dg Hip Portable 1 View Left  11/12/2012   *RADIOLOGY REPORT*  Clinical Data: Left hip arthroplasty.  PORTABLE LEFT HIP - 1 VIEW  Comparison: 06/24/2011.  Findings: The cross-table lateral film demonstrates well seated components of a total hip arthroplasty.  No complicating features are demonstrated.  IMPRESSION: Well seated components of a total left hip arthroplasty.   Original Report Authenticated By: Rudie Meyer, M.D.    Disposition: 01-Home or Self Care      Discharge Orders   Future Orders Complete By Expires     Call MD for:  redness, tenderness, or signs of infection (pain, swelling, redness, odor or green/yellow discharge around incision site)  As directed     Call MD for:  severe uncontrolled pain  As directed     Call MD for:  temperature >100.4  As directed     Change dressing (specify)  As directed     Comments:      Dressing change as needed.    Discharge instructions  As directed     Comments:      Follow up with Dr. Turner Daniels as scheduled (14 days post-op)    Driving Restrictions  As directed     Comments:       No driving for 2 weeks.    Increase activity slowly  As directed     May shower / Bathe  As directed     Walker   As directed           Signed: Nestor Lewandowsky 11/19/2012, 7:24 PM

## 2013-01-16 ENCOUNTER — Other Ambulatory Visit: Payer: Self-pay

## 2013-02-18 DIAGNOSIS — Z961 Presence of intraocular lens: Secondary | ICD-10-CM | POA: Diagnosis not present

## 2013-02-18 DIAGNOSIS — H4010X0 Unspecified open-angle glaucoma, stage unspecified: Secondary | ICD-10-CM | POA: Diagnosis not present

## 2013-03-05 DIAGNOSIS — H409 Unspecified glaucoma: Secondary | ICD-10-CM | POA: Diagnosis not present

## 2013-03-05 DIAGNOSIS — H4011X0 Primary open-angle glaucoma, stage unspecified: Secondary | ICD-10-CM | POA: Diagnosis not present

## 2013-03-24 DIAGNOSIS — Z23 Encounter for immunization: Secondary | ICD-10-CM | POA: Diagnosis not present

## 2013-04-04 DIAGNOSIS — H4011X0 Primary open-angle glaucoma, stage unspecified: Secondary | ICD-10-CM | POA: Diagnosis not present

## 2013-04-04 DIAGNOSIS — H409 Unspecified glaucoma: Secondary | ICD-10-CM | POA: Diagnosis not present

## 2013-04-18 ENCOUNTER — Other Ambulatory Visit: Payer: Self-pay

## 2013-05-02 DIAGNOSIS — M25559 Pain in unspecified hip: Secondary | ICD-10-CM | POA: Diagnosis not present

## 2013-05-11 ENCOUNTER — Ambulatory Visit (INDEPENDENT_AMBULATORY_CARE_PROVIDER_SITE_OTHER): Payer: Medicare Other | Admitting: Internal Medicine

## 2013-05-11 VITALS — BP 126/70 | HR 57 | Temp 97.9°F | Resp 18 | Wt 230.0 lb

## 2013-05-11 DIAGNOSIS — E669 Obesity, unspecified: Secondary | ICD-10-CM | POA: Insufficient documentation

## 2013-05-11 DIAGNOSIS — R05 Cough: Secondary | ICD-10-CM | POA: Diagnosis not present

## 2013-05-11 DIAGNOSIS — R059 Cough, unspecified: Secondary | ICD-10-CM | POA: Diagnosis not present

## 2013-05-11 MED ORDER — HYDROCODONE-HOMATROPINE 5-1.5 MG/5ML PO SYRP: 5.0000 mL | ORAL_SOLUTION | Freq: Four times a day (QID) | ORAL | Status: DC | PRN

## 2013-05-11 MED ORDER — AMOXICILLIN 875 MG PO TABS: 875.0000 mg | ORAL_TABLET | Freq: Two times a day (BID) | ORAL | Status: DC

## 2013-05-11 NOTE — Progress Notes (Addendum)
   Subjective:    Patient ID: [name], female    DOB: Feb 26, 1946, 67 y.o.   MRN: [id number]  HPI nonprod cough for 4 weeks Started with rhinorrhea and ST Happens every year about this time No known AR/asthm No lung probs/nonsmoker Throat congestion last 2 -3 d in am and had some blood in the stuff she cleared up No dysphag No fever chills NS wt loss No chest pain,sob,doe CXR June 14 at hip repl wnl Hx dvt-no pe  Review of Systems non    Objective:   Physical Exam BP 126/70  Pulse 57  Temp(Src) 97.9 F (36.6 C) (Oral)  Resp 18  Wt 230 lb (104.327 kg)  SpO2 98% Eyes cl Tms cl Nose congested thr sl red w/out exud Lungs clear       Assessment & Plan:  Cough-proloned likely 2 to ar induced sinus inf Meds ordered this encounter  Medications  . amoxicillin (AMOXIL) 875 MG tablet    Sig: Take 1 tablet (875 mg total) by mouth 2 (two) times daily.    Dispense:  20 tablet    Refill:  0  . HYDROcodone-homatropine (HYCODAN) 5-1.5 MG/5ML syrup    Sig: Take 5 mLs by mouth every 6 (six) hours as needed for cough.    Dispense:  120 mL    Refill:  0   F/u 10d if not well for CXR

## 2013-05-16 DIAGNOSIS — H409 Unspecified glaucoma: Secondary | ICD-10-CM | POA: Diagnosis not present

## 2013-05-16 DIAGNOSIS — H4011X0 Primary open-angle glaucoma, stage unspecified: Secondary | ICD-10-CM | POA: Diagnosis not present

## 2013-07-08 ENCOUNTER — Other Ambulatory Visit: Payer: Self-pay | Admitting: Internal Medicine

## 2013-08-27 DIAGNOSIS — Z01419 Encounter for gynecological examination (general) (routine) without abnormal findings: Secondary | ICD-10-CM | POA: Diagnosis not present

## 2013-08-27 DIAGNOSIS — N393 Stress incontinence (female) (male): Secondary | ICD-10-CM | POA: Diagnosis not present

## 2013-08-27 DIAGNOSIS — Z1231 Encounter for screening mammogram for malignant neoplasm of breast: Secondary | ICD-10-CM | POA: Diagnosis not present

## 2013-08-27 DIAGNOSIS — N816 Rectocele: Secondary | ICD-10-CM | POA: Diagnosis not present

## 2013-08-27 LAB — HM MAMMOGRAPHY

## 2013-09-12 DIAGNOSIS — H409 Unspecified glaucoma: Secondary | ICD-10-CM | POA: Diagnosis not present

## 2013-09-12 DIAGNOSIS — H4011X0 Primary open-angle glaucoma, stage unspecified: Secondary | ICD-10-CM | POA: Diagnosis not present

## 2013-09-19 ENCOUNTER — Other Ambulatory Visit: Payer: Self-pay

## 2013-09-27 ENCOUNTER — Encounter: Payer: Self-pay | Admitting: Internal Medicine

## 2013-09-27 ENCOUNTER — Ambulatory Visit (INDEPENDENT_AMBULATORY_CARE_PROVIDER_SITE_OTHER): Payer: Medicare Other | Admitting: Internal Medicine

## 2013-09-27 ENCOUNTER — Other Ambulatory Visit (INDEPENDENT_AMBULATORY_CARE_PROVIDER_SITE_OTHER): Payer: Medicare Other

## 2013-09-27 VITALS — BP 130/80 | HR 60 | Wt 231.0 lb

## 2013-09-27 DIAGNOSIS — E119 Type 2 diabetes mellitus without complications: Secondary | ICD-10-CM | POA: Diagnosis not present

## 2013-09-27 DIAGNOSIS — E1165 Type 2 diabetes mellitus with hyperglycemia: Secondary | ICD-10-CM

## 2013-09-27 DIAGNOSIS — IMO0001 Reserved for inherently not codable concepts without codable children: Secondary | ICD-10-CM | POA: Diagnosis not present

## 2013-09-27 DIAGNOSIS — I10 Essential (primary) hypertension: Secondary | ICD-10-CM

## 2013-09-27 DIAGNOSIS — E785 Hyperlipidemia, unspecified: Secondary | ICD-10-CM

## 2013-09-27 DIAGNOSIS — F411 Generalized anxiety disorder: Secondary | ICD-10-CM

## 2013-09-27 LAB — CBC WITH DIFFERENTIAL/PLATELET
Basophils Absolute: 0 10*3/uL (ref 0.0–0.1)
Basophils Relative: 0.4 % (ref 0.0–3.0)
Eosinophils Absolute: 0.2 10*3/uL (ref 0.0–0.7)
Eosinophils Relative: 2.5 % (ref 0.0–5.0)
HCT: 36 % (ref 36.0–46.0)
Hemoglobin: 12.3 g/dL (ref 12.0–15.0)
Lymphocytes Relative: 31.4 % (ref 12.0–46.0)
Lymphs Abs: 2.2 10*3/uL (ref 0.7–4.0)
MCHC: 34.1 g/dL (ref 30.0–36.0)
MCV: 81.5 fl (ref 78.0–100.0)
Monocytes Absolute: 0.5 10*3/uL (ref 0.1–1.0)
Monocytes Relative: 6.9 % (ref 3.0–12.0)
Neutro Abs: 4.2 10*3/uL (ref 1.4–7.7)
Neutrophils Relative %: 58.8 % (ref 43.0–77.0)
Platelets: 215 10*3/uL (ref 150.0–400.0)
RBC: 4.42 Mil/uL (ref 3.87–5.11)
RDW: 14.9 % — ABNORMAL HIGH (ref 11.5–14.6)
WBC: 7.1 10*3/uL (ref 4.5–10.5)

## 2013-09-27 LAB — LIPID PANEL
Cholesterol: 175 mg/dL (ref 0–200)
HDL: 53.4 mg/dL (ref >39.00)
LDL Cholesterol: 102 mg/dL — ABNORMAL HIGH (ref 0–99)
Total CHOL/HDL Ratio: 3
Triglycerides: 96 mg/dL (ref 0.0–149.0)
VLDL: 19.2 mg/dL (ref 0.0–40.0)

## 2013-09-27 LAB — BASIC METABOLIC PANEL
BUN: 10 mg/dL (ref 6–23)
CO2: 28 mEq/L (ref 19–32)
Calcium: 9.1 mg/dL (ref 8.4–10.5)
Chloride: 105 mEq/L (ref 96–112)
Creatinine, Ser: 0.7 mg/dL (ref 0.4–1.2)
GFR: 89.96 mL/min (ref >60.00)
Glucose, Bld: 103 mg/dL — ABNORMAL HIGH (ref 70–99)
Potassium: 3.7 mEq/L (ref 3.5–5.1)
Sodium: 140 mEq/L (ref 135–145)

## 2013-09-27 LAB — HEPATIC FUNCTION PANEL
ALT: 17 U/L (ref 0–35)
AST: 15 U/L (ref 0–37)
Albumin: 3.9 g/dL (ref 3.5–5.2)
Alkaline Phosphatase: 51 U/L (ref 39–117)
Bilirubin, Direct: 0.1 mg/dL (ref 0.0–0.3)
Total Bilirubin: 0.7 mg/dL (ref 0.3–1.2)
Total Protein: 6.9 g/dL (ref 6.0–8.3)

## 2013-09-27 LAB — HEMOGLOBIN A1C: Hgb A1c MFr Bld: 6.5 % (ref 4.6–6.5)

## 2013-09-27 LAB — TSH: TSH: 0.9 u[IU]/mL (ref 0.35–5.50)

## 2013-09-27 MED ORDER — PIOGLITAZONE HCL-METFORMIN HCL 15-500 MG PO TABS: 1.0000 | ORAL_TABLET | Freq: Every day | ORAL | Status: DC

## 2013-09-27 MED ORDER — LISINOPRIL-HYDROCHLOROTHIAZIDE 20-12.5 MG PO TABS: ORAL_TABLET | ORAL | Status: DC

## 2013-09-27 MED ORDER — LOVASTATIN 20 MG PO TABS: 20.0000 mg | ORAL_TABLET | Freq: Every day | ORAL | Status: DC

## 2013-09-27 NOTE — Assessment & Plan Note (Signed)
stable overall by history and exam, recent data reviewed with pt, and pt to continue medical treatment as before,  to f/u any worsening symptoms or concerns Lab Results  Component Value Date   WBC 7.1 09/27/2013   HGB 12.3 09/27/2013   HCT 36.0 09/27/2013   PLT 215.0 09/27/2013   GLUCOSE 103* 09/27/2013   CHOL 175 09/27/2013   TRIG 96.0 09/27/2013   HDL 53.40 09/27/2013   LDLCALC 102* 09/27/2013   ALT 17 09/27/2013   AST 15 09/27/2013   NA 140 09/27/2013   K 3.7 09/27/2013   CL 105 09/27/2013   CREATININE 0.7 09/27/2013   BUN 10 09/27/2013   CO2 28 09/27/2013   TSH 0.90 09/27/2013   INR 1.06 11/08/2012   HGBA1C 6.5 09/27/2013   MICROALBUR 0.5 05/28/2012

## 2013-09-27 NOTE — Progress Notes (Signed)
Pre visit review using our clinic review tool, if applicable. No additional management support is needed unless otherwise documented below in the visit note. 

## 2013-09-27 NOTE — Assessment & Plan Note (Signed)
stable overall by history and exam, recent data reviewed with pt, and pt to continue medical treatment as before,  to f/u any worsening symptoms or concerns BP Readings from Last 3 Encounters:  09/27/13 130/80  05/11/13 126/70  11/16/12 141/57

## 2013-09-27 NOTE — Patient Instructions (Signed)
OK to change the pravastatin to lovastatin 20 mg  Your prescriptions were all refilled to [facility]; remember to pay cash for the lovastatin, and lisinopril - [facility]  Please continue all other medications as before, and refills have been done if requested. Please have the pharmacy call with any other refills you may need.  Please continue your efforts at being more active, low cholesterol diet, and weight control. You are otherwise up to date with prevention measures today.  Please go to the LAB in the Basement (turn left off the elevator) for the tests to be done today  You will be contacted by phone if any changes need to be made immediately.  Otherwise, you will receive a letter about your results with an explanation, but please check with MyChart first.  Please remember to sign up for MyChart if you have not done so, as this will be important to you in the future with finding out test results, communicating by private email, and scheduling acute appointments online when needed.  Please return in 6 months, or sooner if needed

## 2013-09-27 NOTE — Progress Notes (Signed)
Subjective:    Patient ID: [name], female    DOB: 04-04-1946, 68 y.o.   MRN: [id number]  HPI  Here for yearly f/u;  Overall doing ok;  Pt denies CP except for occas sharp ant fleeting pain intermittent (declines ecg) , no worsening SOB, DOE, wheezing, orthopnea, PND, worsening LE edema, palpitations, dizziness or syncope.  Pt denies neurological change such as new headache, facial or extremity weakness.  Pt denies polydipsia, polyuria, or low sugar symptoms. Pt states overall good compliance with treatment and medications, good tolerability, and has been trying to follow lower cholesterol diet.  Pt denies worsening depressive symptoms, suicidal ideation or panic. No fever, night sweats, wt loss, loss of appetite, or other constitutional symptoms.  Pt states good ability with ADL's, has low fall risk, home safety reviewed and adequate, no other significant changes in hearing or vision, and only occasionally active with exercise  . Declines immunization for now.  Talking bout the colon screenign with GYN, has bladder dropped, so need to be coordinated. Denies worsening depressive symptoms, suicidal ideation, or panic Past Medical History  Diagnosis Date  . HYPERLIPIDEMIA 01/04/2007    taking Pravastatin daily  . Overweight 01/04/2007  . ANXIETY 01/04/2007  . GLAUCOMA 07/30/2008  . OTITIS MEDIA, ACUTE, BILATERAL 02/29/2008  . OSTEOARTHRITIS, HIP 09/25/2009  . LEG PAIN, LEFT 07/06/2007  . Dizziness and giddiness 02/29/2008  . NUMBNESS 07/30/2008    in fingers;pt states from Diamox  . TRANSIENT ISCHEMIC ATTACK, HX OF 01/04/2007  . DVT, HX OF     at age 77 in right buttocks  . PONV (postoperative nausea and vomiting)   . GERD 01/04/2007    pt reports resolved with weight loss  . Vision loss     left eye  . History of blood transfusion     no abnormal  reaction  . HYPERTENSION 01/04/2007    takes Lisinopril daily  . Joint pain   . Joint swelling   . DIABETES MELLITUS, TYPE II 01/04/2007    only  takes actoplus daily  . SLEEP APNEA, OBSTRUCTIVE     doesn't use a cpap;study done about 58yrs ago   Past Surgical History  Procedure Laterality Date  . Cholecystectomy    . Oophorectomy    . Knee arthroscopy Right   . Growth removal  2004    from thumb  . Total hip arthroplasty  06/24/2011    Procedure: TOTAL HIP ARTHROPLASTY;  Surgeon: Kerin Salen;  Location: [place];  Service: Orthopedics;  Laterality: Right;  . Abdominal hysterectomy  2000  . Eye surgery  13    shunt left and lazer eye surgery on right cataract and retenia tear with repair  . Total hip arthroplasty Left 11/12/2012    Dr Mayer Camel  . Total hip arthroplasty Left 11/12/2012    Procedure: TOTAL HIP ARTHROPLASTY;  Surgeon: Kerin Salen, MD;  Location[place];  Service: Orthopedics;  Laterality: Left;  DEPUY PINNACLE    reports that she has never smoked. She has never used smokeless tobacco. She reports that she does not drink alcohol or use illicit drugs. family history includes Cancer in her mother; Dementia in her mother; Stroke in her sister. There is no history of Anesthesia problems. Allergies  Allergen Reactions  . Lipitor [Atorvastatin Calcium]     Leg cramp  . Oxycodone Nausea And Vomiting    Patient vomited for 3 days after taking  . Sitagliptin Phosphate Nausea And Vomiting  . Sulfa  Drugs Cross Reactors Nausea And Vomiting   Current Outpatient Prescriptions on File Prior to Visit  Medication Sig Dispense Refill  . Brimonidine Tartrate-Timolol (COMBIGAN OP) Place 1 drop into both eyes 2 (two) times daily.       Marland Kitchen acetaminophen (TYLENOL) 500 MG tablet Take 1,000 mg by mouth every 6 (six) hours as needed for pain.       No current facility-administered medications on file prior to visit.    Review of Systems  Constitutional: Negative for diaphoresis, activity change, appetite change or unexpected weight change.  HENT: Negative for hearing loss, ear pain, facial swelling, mouth sores and neck stiffness.     Eyes: Negative for pain, redness and visual disturbance.  Respiratory: Negative for shortness of breath and wheezing.   Cardiovascular: Negative for chest pain and palpitations.  Gastrointestinal: Negative for diarrhea, blood in stool, abdominal distention or other pain Genitourinary: Negative for hematuria, flank pain or change in urine volume.  Musculoskeletal: Negative for myalgias and joint swelling.  Skin: Negative for color change and wound.  Neurological: Negative for syncope and numbness. other than noted Hematological: Negative for adenopathy.  Psychiatric/Behavioral: Negative for hallucinations, self-injury, decreased concentration and agitation.       Objective:   Physical Exam BP 130/80  Pulse 60  Wt 231 lb (104.781 kg) VS noted,  Constitutional: Pt is oriented to person, place, and time. Appears well-developed and well-nourished.  Head: Normocephalic and atraumatic.  Right Ear: External ear normal.  Left Ear: External ear normal.  Nose: Nose normal.  Mouth/Throat: Oropharynx is clear and moist.  Eyes: Conjunctivae and EOM are normal. Pupils are equal, round, and reactive to light.  Neck: Normal range of motion. Neck supple. No JVD present. No tracheal deviation present.  Cardiovascular: Normal rate, regular rhythm, normal heart sounds and intact distal pulses.   Pulmonary/Chest: Effort normal and breath sounds normal.  Abdominal: Soft. Bowel sounds are normal. There is no tenderness. No HSM  Musculoskeletal: Normal range of motion. Exhibits no edema.  Lymphadenopathy:  Has no cervical adenopathy.  Neurological: Pt is alert and oriented to person, place, and time. Pt has normal reflexes. No cranial nerve deficit.  Skin: Skin is warm and dry. No rash noted.  Psychiatric:  Has  normal mood and affect. Behavior is normal. mild irritable today    Assessment & Plan:

## 2013-09-27 NOTE — Assessment & Plan Note (Addendum)
stable overall by history and exam, recent data reviewed with pt, and pt to change pravastatin to lovastatin 20 due to cost,  to f/u any worsening symptoms or concerns Lab Results  Component Value Date   LDLCALC 102* 09/27/2013

## 2013-09-27 NOTE — Assessment & Plan Note (Signed)
stable overall by history and exam, recent data reviewed with pt, and pt to continue medical treatment as before,  to f/u any worsening symptoms or concerns Lab Results  Component Value Date   HGBA1C 6.5 09/27/2013

## 2014-01-03 ENCOUNTER — Ambulatory Visit (INDEPENDENT_AMBULATORY_CARE_PROVIDER_SITE_OTHER): Payer: Medicare Other | Admitting: Emergency Medicine

## 2014-01-03 ENCOUNTER — Ambulatory Visit (INDEPENDENT_AMBULATORY_CARE_PROVIDER_SITE_OTHER): Payer: Medicare Other

## 2014-01-03 VITALS — BP 124/70 | HR 67 | Temp 98.2°F | Resp 16 | Ht 64.25 in | Wt 235.0 lb

## 2014-01-03 DIAGNOSIS — E139 Other specified diabetes mellitus without complications: Secondary | ICD-10-CM

## 2014-01-03 DIAGNOSIS — R059 Cough, unspecified: Secondary | ICD-10-CM | POA: Diagnosis not present

## 2014-01-03 DIAGNOSIS — R05 Cough: Secondary | ICD-10-CM | POA: Diagnosis not present

## 2014-01-03 DIAGNOSIS — R5383 Other fatigue: Secondary | ICD-10-CM

## 2014-01-03 DIAGNOSIS — E089 Diabetes mellitus due to underlying condition without complications: Secondary | ICD-10-CM

## 2014-01-03 DIAGNOSIS — R5381 Other malaise: Secondary | ICD-10-CM

## 2014-01-03 LAB — POCT CBC
Granulocyte percent: 53 %G (ref 37–80)
HCT, POC: 36.8 % — AB (ref 37.7–47.9)
Hemoglobin: 12.4 g/dL (ref 12.2–16.2)
Lymph, poc: 2 (ref 0.6–3.4)
MCH, POC: 27.6 pg (ref 27–31.2)
MCHC: 33.6 g/dL (ref 31.8–35.4)
MCV: 82.2 fL (ref 80–97)
MID (cbc): 0.5 (ref 0–0.9)
MPV: 7.1 fL (ref 0–99.8)
POC Granulocyte: 2.8 (ref 2–6.9)
POC LYMPH PERCENT: 38.3 %L (ref 10–50)
POC MID %: 8.7 %M (ref 0–12)
Platelet Count, POC: 243 10*3/uL (ref 142–424)
RBC: 4.48 M/uL (ref 4.04–5.48)
RDW, POC: 15.2 %
WBC: 5.3 10*3/uL (ref 4.6–10.2)

## 2014-01-03 LAB — GLUCOSE, POCT (MANUAL RESULT ENTRY): POC Glucose: 87 mg/dl (ref 70–99)

## 2014-01-03 MED ORDER — AMOXICILLIN 875 MG PO TABS
875.0000 mg | ORAL_TABLET | Freq: Two times a day (BID) | ORAL | Status: DC
Start: 2014-01-03 — End: 2014-10-30

## 2014-01-03 MED ORDER — BENZONATATE 100 MG PO CAPS: 100.0000 mg | ORAL_CAPSULE | Freq: Three times a day (TID) | ORAL | Status: DC | PRN

## 2014-01-03 MED ORDER — ALBUTEROL SULFATE (2.5 MG/3ML) 0.083% IN NEBU
2.5000 mg | INHALATION_SOLUTION | Freq: Once | RESPIRATORY_TRACT | Status: AC
  Administered 2014-01-03: 2.5 mg via RESPIRATORY_TRACT

## 2014-01-03 NOTE — Patient Instructions (Signed)
Cough, Adult  A cough is a reflex that helps clear your throat and airways. It can help heal the body or may be a reaction to an irritated airway. A cough may only last 2 or 3 weeks (acute) or may last more than 8 weeks (chronic).  CAUSES Acute cough:  Viral or bacterial infections. Chronic cough:  Infections.  Allergies.  Asthma.  Post-nasal drip.  Smoking.  Heartburn or acid reflux.  Some medicines.  Chronic lung problems (COPD).  Cancer. SYMPTOMS   Cough.  Fever.  Chest pain.  Increased breathing rate.  High-pitched whistling sound when breathing (wheezing).  Colored mucus that you cough up (sputum). TREATMENT   A bacterial cough may be treated with antibiotic medicine.  A viral cough must run its course and will not respond to antibiotics.  Your caregiver may recommend other treatments if you have a chronic cough. HOME CARE INSTRUCTIONS   Only take over-the-counter or prescription medicines for pain, discomfort, or fever as directed by your caregiver. Use cough suppressants only as directed by your caregiver.  Use a cold steam vaporizer or humidifier in your bedroom or home to help loosen secretions.  Sleep in a semi-upright position if your cough is worse at night.  Rest as needed.  Stop smoking if you smoke. SEEK IMMEDIATE MEDICAL CARE IF:   You have pus in your sputum.  Your cough starts to worsen.  You cannot control your cough with suppressants and are losing sleep.  You begin coughing up blood.  You have difficulty breathing.  You develop pain which is getting worse or is uncontrolled with medicine.  You have a fever. MAKE SURE YOU:   Understand these instructions.  Will watch your condition.  Will get help right away if you are not doing well or get worse. Document Released: 11/26/2010 Document Revised: 08/22/2011 Document Reviewed: 11/26/2010 [employer] Patient Information 2015 [employer]. This information is not intended  to replace advice given to you by your health care provider. Make sure you discuss any questions you have with your health care provider.  

## 2014-01-03 NOTE — Progress Notes (Addendum)
Subjective:  This chart was scribed for Remo Lipps A. Remmy Crass MD,   by Stacy Gardner, [facility] Scribe. The patient was seen in room and the patient's care was started at 4:14 PM.  Chief Complaint  Patient presents with  . Cough    x4 days; pt indicates she feels bad. cough is not all the way dry or wet. little clear sputum when cough; pt took Tussin and Tylenol around 1pm today (minor relief)  . Fatigue    pt indicates she has no energy  . Fever    pt indicated she had a slight fever     Patient ID: [name], female    DOB: May 22, 1946, 68 y.o.   MRN: [id number]  01/03/2014  Cough, Fatigue and Fever   Cough Associated symptoms include a fever.  Fever  Associated symptoms include congestion and coughing.   HPI Comments: [name] is a 68 y.o. female w/ hx of thyroid disorder, HTN,  and DM, who arrives to the [facility] complaining of cry hacking cough and congestion, onset four days ago. Pt has tried taking Tussin and Tylenol without relief. Pt's last dose of Tussin and Tylenol was three years ago. She had a mild fever and fatigue. Pt was prescribed an inhaler last year for similar symptoms. Pt's husband was seen for the same symptoms.  Pt is currently taking Lisinopril.  Pt PCP is Dr. Cathlean Cower Patient Active Problem List   Diagnosis Date Noted  . Obesity (BMI 30-39.9) 05/11/2013  . Osteoarthritis of left hip 11/12/2012  . Degenerative arthritis of hip 05/28/2012  . Preventative health care 10/22/2010  . OSTEOARTHRITIS, HIP 09/25/2009  . GLAUCOMA 07/30/2008  . NUMBNESS 07/30/2008  . Dizziness and giddiness 02/29/2008  . LEG PAIN, LEFT 07/06/2007  . DVT, HX OF 07/06/2007  . DIABETES MELLITUS, TYPE II 01/04/2007  . HYPERLIPIDEMIA 01/04/2007  . Overweight 01/04/2007  . ANXIETY 01/04/2007  . SLEEP APNEA, OBSTRUCTIVE 01/04/2007  . HYPERTENSION 01/04/2007  . ASTHMA 01/04/2007  . GERD 01/04/2007  . TRANSIENT ISCHEMIC ATTACK, HX  OF 01/04/2007   Past Medical History  Diagnosis Date  . HYPERLIPIDEMIA 01/04/2007    taking Pravastatin daily  . Overweight(278.02) 01/04/2007  . ANXIETY 01/04/2007  . GLAUCOMA 07/30/2008  . OTITIS MEDIA, ACUTE, BILATERAL 02/29/2008  . OSTEOARTHRITIS, HIP 09/25/2009  . LEG PAIN, LEFT 07/06/2007  . Dizziness and giddiness 02/29/2008  . NUMBNESS 07/30/2008    in fingers;pt states from Diamox  . TRANSIENT ISCHEMIC ATTACK, HX OF 01/04/2007  . DVT, HX OF     at age 47 in right buttocks  . PONV (postoperative nausea and vomiting)   . GERD 01/04/2007    pt reports resolved with weight loss  . Vision loss     left eye  . History of blood transfusion     no abnormal  reaction  . HYPERTENSION 01/04/2007    takes Lisinopril daily  . Joint pain   . Joint swelling   . DIABETES MELLITUS, TYPE II 01/04/2007    only takes actoplus daily  . SLEEP APNEA, OBSTRUCTIVE     doesn't use a cpap;study done about 70yrs ago   Past Surgical History  Procedure Laterality Date  . Cholecystectomy    . Oophorectomy    . Knee arthroscopy Right   . Growth removal  2004    from thumb  . Total hip arthroplasty  06/24/2011    Procedure: TOTAL HIP ARTHROPLASTY;  Surgeon:  Kerin Salen;  Location: [place];  Service: Orthopedics;  Laterality: Right;  . Abdominal hysterectomy  2000  . Eye surgery  13    shunt left and lazer eye surgery on right cataract and retenia tear with repair  . Total hip arthroplasty Left 11/12/2012    Dr Mayer Camel  . Total hip arthroplasty Left 11/12/2012    Procedure: TOTAL HIP ARTHROPLASTY;  Surgeon: Kerin Salen, MD;  Location: [place];  Service: Orthopedics;  Laterality: Left;  DEPUY PINNACLE   Allergies  Allergen Reactions  . Lipitor [Atorvastatin Calcium]     Leg cramp  . Oxycodone Nausea And Vomiting    Patient vomited for 3 days after taking  . Sitagliptin Phosphate Nausea And Vomiting  . Sulfa Drugs Cross Reactors Nausea And Vomiting   Prior to Admission medications   Medication Sig  Start Date End Date Taking? Authorizing Provider  acetaminophen (TYLENOL) 500 MG tablet Take 1,000 mg by mouth every 6 (six) hours as needed for pain.   Yes Historical Provider, MD  aspirin 81 MG tablet Take 81 mg by mouth daily.   Yes Historical Provider, MD  Brimonidine Tartrate-Timolol (COMBIGAN OP) Place 1 drop into both eyes 2 (two) times daily.    Yes Historical Provider, MD  dorzolamide (TRUSOPT) 2 % ophthalmic solution Place 1 drop into both eyes 2 (two) times daily.   Yes Historical Provider, MD  latanoprost (XALATAN) 0.005 % ophthalmic solution Place 1 drop into both eyes at bedtime.   Yes Historical Provider, MD  lisinopril-hydrochlorothiazide (PRINZIDE,ZESTORETIC) 20-12.5 MG per tablet TAKE ONE TABLET BY MOUTH EVERY DAY 09/27/13  Yes Biagio Borg, MD  lovastatin (MEVACOR) 20 MG tablet Take 1 tablet (20 mg total) by mouth at bedtime. 09/27/13  Yes Biagio Borg, MD  pioglitazone-metformin (ACTOPLUS MET) 15-500 MG per tablet Take 1 tablet by mouth daily. 09/27/13  Yes Biagio Borg, MD  pseudoephedrine-guaifenesin (TUSSIN PE) 30-100 MG/5ML SYRP Take 5 mLs by mouth as needed.   Yes Historical Provider, MD   History   Social History  . Marital Status: Married    Spouse Name: N/A    Number of Children: N/A  . Years of Education: N/A   Occupational History  . office administrator    Social History Main Topics  . Smoking status: Never Smoker   . Smokeless tobacco: Never Used  . Alcohol Use: No  . Drug Use: No  . Sexual Activity: Not Currently   Other Topics Concern  . Not on file   Social History Narrative  . No narrative on file     Review of Systems  Constitutional: Positive for fever and fatigue.  HENT: Positive for congestion.   Respiratory: Positive for cough.     Past Medical History  Diagnosis Date  . HYPERLIPIDEMIA 01/04/2007    taking Pravastatin daily  . Overweight(278.02) 01/04/2007  . ANXIETY 01/04/2007  . GLAUCOMA 07/30/2008  . OTITIS MEDIA, ACUTE, BILATERAL  02/29/2008  . OSTEOARTHRITIS, HIP 09/25/2009  . LEG PAIN, LEFT 07/06/2007  . Dizziness and giddiness 02/29/2008  . NUMBNESS 07/30/2008    in fingers;pt states from Diamox  . TRANSIENT ISCHEMIC ATTACK, HX OF 01/04/2007  . DVT, HX OF     at age 38 in right buttocks  . PONV (postoperative nausea and vomiting)   . GERD 01/04/2007    pt reports resolved with weight loss  . Vision loss     left eye  . History of blood transfusion  no abnormal  reaction  . HYPERTENSION 01/04/2007    takes Lisinopril daily  . Joint pain   . Joint swelling   . DIABETES MELLITUS, TYPE II 01/04/2007    only takes actoplus daily  . SLEEP APNEA, OBSTRUCTIVE     doesn't use a cpap;study done about 46yrs ago   Allergies  Allergen Reactions  . Lipitor [Atorvastatin Calcium]     Leg cramp  . Oxycodone Nausea And Vomiting    Patient vomited for 3 days after taking  . Sitagliptin Phosphate Nausea And Vomiting  . Sulfa Drugs Cross Reactors Nausea And Vomiting   Current Outpatient Prescriptions  Medication Sig Dispense Refill  . acetaminophen (TYLENOL) 500 MG tablet Take 1,000 mg by mouth every 6 (six) hours as needed for pain.      Marland Kitchen aspirin 81 MG tablet Take 81 mg by mouth daily.      . Brimonidine Tartrate-Timolol (COMBIGAN OP) Place 1 drop into both eyes 2 (two) times daily.       . dorzolamide (TRUSOPT) 2 % ophthalmic solution Place 1 drop into both eyes 2 (two) times daily.      Marland Kitchen latanoprost (XALATAN) 0.005 % ophthalmic solution Place 1 drop into both eyes at bedtime.      Marland Kitchen lisinopril-hydrochlorothiazide (PRINZIDE,ZESTORETIC) 20-12.5 MG per tablet TAKE ONE TABLET BY MOUTH EVERY DAY  90 tablet  3  . lovastatin (MEVACOR) 20 MG tablet Take 1 tablet (20 mg total) by mouth at bedtime.  90 tablet  3  . pioglitazone-metformin (ACTOPLUS MET) 15-500 MG per tablet Take 1 tablet by mouth daily.  90 tablet  3  . pseudoephedrine-guaifenesin (TUSSIN PE) 30-100 MG/5ML SYRP Take 5 mLs by mouth as needed.       No current  facility-administered medications for this visit.       Objective:     Filed Vitals:   01/03/14 1604  BP: 124/70  Pulse: 67  Temp: 98.2 F (36.8 C)  TempSrc: Oral  Resp: 16  Height: 5' 4.25" (1.632 m)  Weight: 235 lb (106.595 kg)  SpO2: 99%    DIAGNOSTIC STUDIES:    COORDINATION OF CARE:  4:14 PM Discussed course of care with pt . Pt understands and agrees.    Physical Exam  CONSTITUTIONAL: Well developed/well nourished HEAD: Normocephalic/atraumatic EYES: EOMI/PERRL ENMT: Mucous membranes moist NECK: supple no meningeal signs, diffuse enlargement of thyroid without any definite nodules.  SPINE:entire spine nontender CV: S1/S2 noted, no murmurs/rubs/gallops noted LUNGS:Few dry rales in left base. no apparent distress NEURO: Pt is awake/alert, moves all extremitiesx4 EXTREMITIES: pulses normal, full ROM, no edema. SKIN: warm, color normal   PSYCH: no abnormalities of mood noted Results for orders placed in visit on 01/03/14  POCT CBC      Result Value Ref Range   WBC 5.3  4.6 - 10.2 K/uL   Lymph, poc 2.0  0.6 - 3.4   POC LYMPH PERCENT 38.3  10 - 50 %L   MID (cbc) 0.5  0 - 0.9   POC MID % 8.7  0 - 12 %M   POC Granulocyte 2.8  2 - 6.9   Granulocyte percent 53.0  37 - 80 %G   RBC 4.48  4.04 - 5.48 M/uL   Hemoglobin 12.4  12.2 - 16.2 g/dL   HCT, POC 36.8 (*) 37.7 - 47.9 %   MCV 82.2  80 - 97 fL   MCH, POC 27.6  27 - 31.2 pg   MCHC 33.6  31.8 -  35.4 g/dL   RDW, POC 15.2     Platelet Count, POC 243  142 - 424 K/uL   MPV 7.1  0 - 99.8 fL  GLUCOSE, POCT (MANUAL RESULT ENTRY)      Result Value Ref Range   POC Glucose 87  70 - 99 mg/dl  [facility] reading (PRIMARY) by  Dr. Everlene Farrier no acute disease.      Assessment & Plan:  Patient did not improve with nebulizer treatment. Peak flow remained at 275. We'll treat her cough with Tessalon Perles. When she returns from vacation she will discuss possibly changing her blood pressure medication. She has been on an ACE  inhibitor for a long time and may benefit from a try with an ARB to see if that helps with her cough the radiologist felt she could have some mild subsegmental atelectasis so we'll cover with amoxicillin 875 twice a day   I personally performed the services described in this documentation, which was scribed in my presence. The recorded information has been reviewed and is accurate.

## 2014-01-04 LAB — T4, FREE: Free T4: 1.12 ng/dL (ref 0.80–1.80)

## 2014-01-04 LAB — TSH: TSH: 0.629 u[IU]/mL (ref 0.350–4.500)

## 2014-01-09 DIAGNOSIS — H409 Unspecified glaucoma: Secondary | ICD-10-CM | POA: Diagnosis not present

## 2014-01-09 DIAGNOSIS — H4011X0 Primary open-angle glaucoma, stage unspecified: Secondary | ICD-10-CM | POA: Diagnosis not present

## 2014-01-09 DIAGNOSIS — B309 Viral conjunctivitis, unspecified: Secondary | ICD-10-CM | POA: Diagnosis not present

## 2014-01-23 DIAGNOSIS — H4011X0 Primary open-angle glaucoma, stage unspecified: Secondary | ICD-10-CM | POA: Diagnosis not present

## 2014-01-23 DIAGNOSIS — H409 Unspecified glaucoma: Secondary | ICD-10-CM | POA: Diagnosis not present

## 2014-02-14 DIAGNOSIS — N816 Rectocele: Secondary | ICD-10-CM | POA: Diagnosis not present

## 2014-02-14 DIAGNOSIS — N393 Stress incontinence (female) (male): Secondary | ICD-10-CM | POA: Diagnosis not present

## 2014-02-14 DIAGNOSIS — N8111 Cystocele, midline: Secondary | ICD-10-CM | POA: Diagnosis not present

## 2014-03-14 DIAGNOSIS — N816 Rectocele: Secondary | ICD-10-CM | POA: Diagnosis not present

## 2014-03-14 DIAGNOSIS — R350 Frequency of micturition: Secondary | ICD-10-CM | POA: Diagnosis not present

## 2014-03-14 DIAGNOSIS — N3946 Mixed incontinence: Secondary | ICD-10-CM | POA: Diagnosis not present

## 2014-03-14 DIAGNOSIS — N8111 Cystocele, midline: Secondary | ICD-10-CM | POA: Diagnosis not present

## 2014-03-24 DIAGNOSIS — Z961 Presence of intraocular lens: Secondary | ICD-10-CM | POA: Diagnosis not present

## 2014-03-24 DIAGNOSIS — H4010X0 Unspecified open-angle glaucoma, stage unspecified: Secondary | ICD-10-CM | POA: Diagnosis not present

## 2014-03-24 DIAGNOSIS — H04122 Dry eye syndrome of left lacrimal gland: Secondary | ICD-10-CM | POA: Diagnosis not present

## 2014-03-24 DIAGNOSIS — I10 Essential (primary) hypertension: Secondary | ICD-10-CM | POA: Diagnosis not present

## 2014-03-24 DIAGNOSIS — H43813 Vitreous degeneration, bilateral: Secondary | ICD-10-CM | POA: Diagnosis not present

## 2014-03-27 ENCOUNTER — Ambulatory Visit (INDEPENDENT_AMBULATORY_CARE_PROVIDER_SITE_OTHER): Payer: Medicare Other | Admitting: *Deleted

## 2014-03-27 DIAGNOSIS — Z23 Encounter for immunization: Secondary | ICD-10-CM | POA: Diagnosis not present

## 2014-03-28 ENCOUNTER — Other Ambulatory Visit: Payer: Self-pay

## 2014-04-04 DIAGNOSIS — N8111 Cystocele, midline: Secondary | ICD-10-CM | POA: Diagnosis not present

## 2014-04-04 DIAGNOSIS — N816 Rectocele: Secondary | ICD-10-CM | POA: Diagnosis not present

## 2014-04-04 DIAGNOSIS — N3946 Mixed incontinence: Secondary | ICD-10-CM | POA: Diagnosis not present

## 2014-04-04 DIAGNOSIS — R350 Frequency of micturition: Secondary | ICD-10-CM | POA: Diagnosis not present

## 2014-04-25 DIAGNOSIS — H1010 Acute atopic conjunctivitis, unspecified eye: Secondary | ICD-10-CM | POA: Diagnosis not present

## 2014-04-25 DIAGNOSIS — H4011X0 Primary open-angle glaucoma, stage unspecified: Secondary | ICD-10-CM | POA: Diagnosis not present

## 2014-04-25 DIAGNOSIS — Z961 Presence of intraocular lens: Secondary | ICD-10-CM | POA: Diagnosis not present

## 2014-04-28 DIAGNOSIS — N3946 Mixed incontinence: Secondary | ICD-10-CM | POA: Diagnosis not present

## 2014-04-28 DIAGNOSIS — N816 Rectocele: Secondary | ICD-10-CM | POA: Diagnosis not present

## 2014-08-07 DIAGNOSIS — H4011X0 Primary open-angle glaucoma, stage unspecified: Secondary | ICD-10-CM | POA: Diagnosis not present

## 2014-08-07 DIAGNOSIS — Z961 Presence of intraocular lens: Secondary | ICD-10-CM | POA: Diagnosis not present

## 2014-08-07 DIAGNOSIS — H04123 Dry eye syndrome of bilateral lacrimal glands: Secondary | ICD-10-CM | POA: Diagnosis not present

## 2014-10-29 DIAGNOSIS — H3530 Unspecified macular degeneration: Secondary | ICD-10-CM | POA: Diagnosis not present

## 2014-10-29 DIAGNOSIS — H4011X4 Primary open-angle glaucoma, indeterminate stage: Secondary | ICD-10-CM | POA: Diagnosis not present

## 2014-10-29 DIAGNOSIS — Z961 Presence of intraocular lens: Secondary | ICD-10-CM | POA: Diagnosis not present

## 2014-10-29 DIAGNOSIS — E119 Type 2 diabetes mellitus without complications: Secondary | ICD-10-CM | POA: Diagnosis not present

## 2014-10-30 ENCOUNTER — Other Ambulatory Visit (INDEPENDENT_AMBULATORY_CARE_PROVIDER_SITE_OTHER): Payer: Medicare Other

## 2014-10-30 ENCOUNTER — Encounter: Payer: Self-pay | Admitting: Internal Medicine

## 2014-10-30 ENCOUNTER — Ambulatory Visit (INDEPENDENT_AMBULATORY_CARE_PROVIDER_SITE_OTHER): Payer: Medicare Other | Admitting: Internal Medicine

## 2014-10-30 VITALS — BP 160/92 | HR 64 | Temp 98.4°F | Wt 240.1 lb

## 2014-10-30 DIAGNOSIS — E119 Type 2 diabetes mellitus without complications: Secondary | ICD-10-CM | POA: Diagnosis not present

## 2014-10-30 DIAGNOSIS — I10 Essential (primary) hypertension: Secondary | ICD-10-CM | POA: Diagnosis not present

## 2014-10-30 DIAGNOSIS — Z1231 Encounter for screening mammogram for malignant neoplasm of breast: Secondary | ICD-10-CM

## 2014-10-30 DIAGNOSIS — E785 Hyperlipidemia, unspecified: Secondary | ICD-10-CM

## 2014-10-30 DIAGNOSIS — F418 Other specified anxiety disorders: Secondary | ICD-10-CM | POA: Diagnosis not present

## 2014-10-30 LAB — CBC WITH DIFFERENTIAL/PLATELET
Basophils Absolute: 0 10*3/uL (ref 0.0–0.1)
Basophils Relative: 0.5 % (ref 0.0–3.0)
Eosinophils Absolute: 0.1 10*3/uL (ref 0.0–0.7)
Eosinophils Relative: 1.3 % (ref 0.0–5.0)
HCT: 37.5 % (ref 36.0–46.0)
Hemoglobin: 12.7 g/dL (ref 12.0–15.0)
Lymphocytes Relative: 31.7 % (ref 12.0–46.0)
Lymphs Abs: 2.5 10*3/uL (ref 0.7–4.0)
MCHC: 33.8 g/dL (ref 30.0–36.0)
MCV: 80.3 fl (ref 78.0–100.0)
Monocytes Absolute: 0.6 10*3/uL (ref 0.1–1.0)
Monocytes Relative: 7.2 % (ref 3.0–12.0)
Neutro Abs: 4.7 10*3/uL (ref 1.4–7.7)
Neutrophils Relative %: 59.3 % (ref 43.0–77.0)
Platelets: 235 10*3/uL (ref 150.0–400.0)
RBC: 4.67 Mil/uL (ref 3.87–5.11)
RDW: 15 % (ref 11.5–15.5)
WBC: 7.9 10*3/uL (ref 4.0–10.5)

## 2014-10-30 LAB — URINALYSIS, ROUTINE W REFLEX MICROSCOPIC
Bilirubin Urine: NEGATIVE
Hgb urine dipstick: NEGATIVE
Ketones, ur: NEGATIVE
Leukocytes, UA: NEGATIVE
Nitrite: NEGATIVE
RBC / HPF: NONE SEEN (ref 0–?)
Specific Gravity, Urine: 1.01 (ref 1.000–1.030)
Total Protein, Urine: NEGATIVE
Urine Glucose: NEGATIVE
Urobilinogen, UA: 0.2 (ref 0.0–1.0)
pH: 7 (ref 5.0–8.0)

## 2014-10-30 LAB — LIPID PANEL
Cholesterol: 181 mg/dL (ref 0–200)
HDL: 54.2 mg/dL (ref >39.00)
LDL Cholesterol: 98 mg/dL (ref 0–99)
NonHDL: 126.8
Total CHOL/HDL Ratio: 3
Triglycerides: 143 mg/dL (ref 0.0–149.0)
VLDL: 28.6 mg/dL (ref 0.0–40.0)

## 2014-10-30 LAB — BASIC METABOLIC PANEL
BUN: 12 mg/dL (ref 6–23)
CO2: 31 mEq/L (ref 19–32)
Calcium: 9.7 mg/dL (ref 8.4–10.5)
Chloride: 104 mEq/L (ref 96–112)
Creatinine, Ser: 0.81 mg/dL (ref 0.40–1.20)
GFR: 74.52 mL/min (ref >60.00)
Glucose, Bld: 105 mg/dL — ABNORMAL HIGH (ref 70–99)
Potassium: 4 mEq/L (ref 3.5–5.1)
Sodium: 140 mEq/L (ref 135–145)

## 2014-10-30 LAB — MICROALBUMIN / CREATININE URINE RATIO
Creatinine,U: 38.5 mg/dL
Microalb Creat Ratio: 1.8 mg/g (ref 0.0–30.0)
Microalb, Ur: <0.7 mg/dL (ref 0.0–1.9)

## 2014-10-30 LAB — HEPATIC FUNCTION PANEL
ALT: 16 U/L (ref 0–35)
AST: 13 U/L (ref 0–37)
Albumin: 4.3 g/dL (ref 3.5–5.2)
Alkaline Phosphatase: 54 U/L (ref 39–117)
Bilirubin, Direct: 0.1 mg/dL (ref 0.0–0.3)
Total Bilirubin: 0.5 mg/dL (ref 0.2–1.2)
Total Protein: 7.4 g/dL (ref 6.0–8.3)

## 2014-10-30 LAB — TSH: TSH: 1.18 u[IU]/mL (ref 0.35–4.50)

## 2014-10-30 LAB — HEMOGLOBIN A1C: Hgb A1c MFr Bld: 6.9 % — ABNORMAL HIGH (ref 4.6–6.5)

## 2014-10-30 MED ORDER — METFORMIN HCL ER 500 MG PO TB24: ORAL_TABLET | ORAL | Status: DC

## 2014-10-30 NOTE — Patient Instructions (Addendum)
Ok to stop the actosplus  Please take all new medication as prescribed  - the metformin 500 mg ER- 2 in the AM  Please call if you change your mind about re-starting the celexa  Please continue all other medications as before, and refills have been done if requested.  Please have the pharmacy call with any other refills you may need.  Please continue your efforts at being more active, low cholesterol diet, and weight control.  Please keep your appointments with your specialists as you may have planned  You will be contacted regarding the referral for: mammogram  Please go to the LAB in the Basement (turn left off the elevator) for the tests to be done today  You will be contacted by phone if any changes need to be made immediately.  Otherwise, you will receive a letter about your results with an explanation, but please check with MyChart first.  Please remember to sign up for MyChart if you have not done so, as this will be important to you in the future with finding out test results, communicating by private email, and scheduling acute appointments online when needed.  Please return in 6 months, or sooner if needed

## 2014-10-30 NOTE — Progress Notes (Signed)
Subjective:    Patient ID: [name], female    DOB: March 28, 1946, 69 y.o.   MRN: [id number]  HPI  Here to f/u; overall doing ok,  Pt denies chest pain, increasing sob or doe, wheezing, orthopnea, PND, increased LE swelling, palpitations, dizziness or syncope., except mentions twice weekly quick sharp stabbing pains to the left lat  Chest.  Pt denies new neurological symptoms such as new headache, or facial or extremity weakness or numbness.  Pt denies polydipsia, polyuria, or low sugar episode.   Pt denies new neurological symptoms such as new headache, or facial or extremity weakness or numbness.   Pt states overall good compliance with meds, mostly trying to follow appropriate diet, with wt overall stable,  but little exercise however.  Has gained 10 lbs due to less active. CBG's have been elevated  STates most of complaints are listed on the side effect profile of actosplusmed, has been taking since last July. Was losing wt now gaining, C/o Worsening LE edema , and occas dizziness, also left big toe hurts and this was listed as well. No low sugars. Wt Readings from Last 3 Encounters:  10/30/14 240 lb 1.9 oz (108.918 kg)  01/03/14 235 lb (106.595 kg)  09/27/13 231 lb (104.781 kg)  Due for mammogram  More irritable, ? Depressed recnetly with Worsneing vision recently with glaucoma, onset 2012, for f/u surgury both eyes soon.  To start diamox for 1 wk per [facility] optho.  States BP at home < 140/90 Past Medical History  Diagnosis Date  . HYPERLIPIDEMIA 01/04/2007    taking Pravastatin daily  . Overweight(278.02) 01/04/2007  . ANXIETY 01/04/2007  . GLAUCOMA 07/30/2008  . OTITIS MEDIA, ACUTE, BILATERAL 02/29/2008  . OSTEOARTHRITIS, HIP 09/25/2009  . LEG PAIN, LEFT 07/06/2007  . Dizziness and giddiness 02/29/2008  . NUMBNESS 07/30/2008    in fingers;pt states from Diamox  . TRANSIENT ISCHEMIC ATTACK, HX OF 01/04/2007  . DVT, HX OF     at age 55 in right buttocks  . PONV (postoperative nausea and  vomiting)   . GERD 01/04/2007    pt reports resolved with weight loss  . Vision loss     left eye  . History of blood transfusion     no abnormal  reaction  . HYPERTENSION 01/04/2007    takes Lisinopril daily  . Joint pain   . Joint swelling   . DIABETES MELLITUS, TYPE II 01/04/2007    only takes actoplus daily  . SLEEP APNEA, OBSTRUCTIVE     doesn't use a cpap;study done about 37yrs ago   Past Surgical History  Procedure Laterality Date  . Cholecystectomy    . Oophorectomy    . Knee arthroscopy Right   . Growth removal  2004    from thumb  . Total hip arthroplasty  06/24/2011    Procedure: TOTAL HIP ARTHROPLASTY;  Surgeon: Nestor Lewandowsky;  Location: [facility];  Service: Orthopedics;  Laterality: Right;  . Abdominal hysterectomy  2000  . Eye surgery  13    shunt left and lazer eye surgery on right cataract and retenia tear with repair  . Total hip arthroplasty Left 11/12/2012    Dr Turner Daniels  . Total hip arthroplasty Left 11/12/2012    Procedure: TOTAL HIP ARTHROPLASTY;  Surgeon: Nestor Lewandowsky, MD;  Location: [facility];  Service: Orthopedics;  Laterality: Left;  DEPUY PINNACLE    reports that she has never smoked. She has never used smokeless tobacco. She reports that  she does not drink alcohol or use illicit drugs. family history includes Cancer in her mother; Dementia in her mother; Stroke in her sister. There is no history of Anesthesia problems. Allergies  Allergen Reactions  . Lipitor [Atorvastatin Calcium]     Leg cramp  . Oxycodone Nausea And Vomiting    Patient vomited for 3 days after taking  . Sitagliptin Phosphate Nausea And Vomiting  . Sulfa Drugs Cross Reactors Nausea And Vomiting   Current Outpatient Prescriptions on File Prior to Visit  Medication Sig Dispense Refill  . acetaminophen (TYLENOL) 500 MG tablet Take 1,000 mg by mouth every 6 (six) hours as needed for pain.    Marland Kitchen aspirin 81 MG tablet Take 81 mg by mouth daily.    . dorzolamide (TRUSOPT) 2 % ophthalmic solution  Place 1 drop into both eyes 2 (two) times daily.    Marland Kitchen lisinopril-hydrochlorothiazide (PRINZIDE,ZESTORETIC) 20-12.5 MG per tablet TAKE ONE TABLET BY MOUTH EVERY DAY 90 tablet 3  . lovastatin (MEVACOR) 20 MG tablet Take 1 tablet (20 mg total) by mouth at bedtime. 90 tablet 3  . pioglitazone-metformin (ACTOPLUS MET) 15-500 MG per tablet Take 1 tablet by mouth daily. 90 tablet 3  . amoxicillin (AMOXIL) 875 MG tablet Take 1 tablet (875 mg total) by mouth 2 (two) times daily. (Patient not taking: Reported on 10/30/2014) 20 tablet 0  . benzonatate (TESSALON) 100 MG capsule Take 1-2 capsules (100-200 mg total) by mouth 3 (three) times daily as needed for cough. (Patient not taking: Reported on 10/30/2014) 40 capsule 0  . Brimonidine Tartrate-Timolol (COMBIGAN OP) Place 1 drop into both eyes 2 (two) times daily.     Marland Kitchen latanoprost (XALATAN) 0.005 % ophthalmic solution Place 1 drop into both eyes at bedtime.    . pseudoephedrine-guaifenesin (TUSSIN PE) 30-100 MG/5ML SYRP Take 5 mLs by mouth as needed.     No current facility-administered medications on file prior to visit.    Review of Systems  Constitutional: Negative for unusual diaphoresis or night sweats HENT: Negative for ringing in ear or discharge Eyes: Negative for double vision or worsening visual disturbance.  Respiratory: Negative for choking and stridor.   Gastrointestinal: Negative for vomiting or other signifcant bowel change Genitourinary: Negative for hematuria or change in urine volume.  Musculoskeletal: Negative for other MSK pain or swelling Skin: Negative for color change and worsening wound.  Neurological: Negative for tremors and numbness other than noted  Psychiatric/Behavioral: Negative for decreased concentration or agitation other than above       Objective:   Physical Exam BP 160/92 mmHg  Pulse 64  Temp(Src) 98.4 F (36.9 C) (Oral)  Wt 240 lb 1.9 oz (108.918 kg)  SpO2 96% VS noted,  Constitutional: Pt appears in no  significant distress HENT: Head: NCAT.  Right Ear: External ear normal.  Left Ear: External ear normal.  Eyes: . Pupils are equal, round, and reactive to light. Conjunctivae and EOM are normal Neck: Normal range of motion. Neck supple.  Cardiovascular: Normal rate and regular rhythm.   Pulmonary/Chest: Effort normal and breath sounds without rales or wheezing.  Abd:  Soft, NT, ND, + BS Neurological: Pt is alert. Not confused , motor grossly intact Skin: Skin is warm. No rash, no LE edema Psychiatric: Pt behavior is normal. No agitation. +nervous, irrtiable, depressed affect    Assessment & Plan:

## 2014-10-30 NOTE — Progress Notes (Signed)
Pre visit review using our clinic review tool, if applicable. No additional management support is needed unless otherwise documented below in the visit note. 

## 2014-11-01 NOTE — Assessment & Plan Note (Signed)
elev BP, BP at home reportedly stable,  recent data reviewed with pt, and pt to continue medical treatment as before,  to f/u any worsening symptoms or concerns BP Readings from Last 3 Encounters:  10/30/14 160/92  01/03/14 124/70  09/27/13 130/80

## 2014-11-01 NOTE — Assessment & Plan Note (Signed)
Pt with possible actosplus met sideeffect, to d/c this, change to metformin ER 500 - 2 qam, o/w stable overall by history and exam, recent data reviewed with pt, and pt to continue medical treatment as before,  to f/u any worsening symptoms or concerns  Lab Results  Component Value Date   HGBA1C 6.9* 10/30/2014

## 2014-11-01 NOTE — Assessment & Plan Note (Signed)
stable overall by history and exam, recent data reviewed with pt, and pt to continue medical treatment as before,  to f/u any worsening symptoms or concerns Lab Results  Component Value Date   LDLCALC 98 10/30/2014  '

## 2014-11-01 NOTE — Assessment & Plan Note (Signed)
Mild to mod, declines SI or HI, declines counseling referral, declines ssri trial such as celexa

## 2014-11-06 DIAGNOSIS — H4011X4 Primary open-angle glaucoma, indeterminate stage: Secondary | ICD-10-CM | POA: Diagnosis not present

## 2014-11-06 DIAGNOSIS — E119 Type 2 diabetes mellitus without complications: Secondary | ICD-10-CM | POA: Diagnosis not present

## 2014-11-06 DIAGNOSIS — Z961 Presence of intraocular lens: Secondary | ICD-10-CM | POA: Diagnosis not present

## 2014-11-06 DIAGNOSIS — H3530 Unspecified macular degeneration: Secondary | ICD-10-CM | POA: Diagnosis not present

## 2014-11-14 ENCOUNTER — Encounter (INDEPENDENT_AMBULATORY_CARE_PROVIDER_SITE_OTHER): Payer: Medicare Other | Admitting: Ophthalmology

## 2014-11-14 DIAGNOSIS — I10 Essential (primary) hypertension: Secondary | ICD-10-CM

## 2014-11-14 DIAGNOSIS — H43813 Vitreous degeneration, bilateral: Secondary | ICD-10-CM | POA: Diagnosis not present

## 2014-11-14 DIAGNOSIS — E11329 Type 2 diabetes mellitus with mild nonproliferative diabetic retinopathy without macular edema: Secondary | ICD-10-CM | POA: Diagnosis not present

## 2014-11-14 DIAGNOSIS — H35033 Hypertensive retinopathy, bilateral: Secondary | ICD-10-CM | POA: Diagnosis not present

## 2014-11-14 DIAGNOSIS — H3531 Nonexudative age-related macular degeneration: Secondary | ICD-10-CM

## 2014-11-14 DIAGNOSIS — H33301 Unspecified retinal break, right eye: Secondary | ICD-10-CM | POA: Diagnosis not present

## 2014-11-14 DIAGNOSIS — E11319 Type 2 diabetes mellitus with unspecified diabetic retinopathy without macular edema: Secondary | ICD-10-CM | POA: Diagnosis not present

## 2014-11-17 DIAGNOSIS — Z1231 Encounter for screening mammogram for malignant neoplasm of breast: Secondary | ICD-10-CM | POA: Diagnosis not present

## 2014-11-18 DIAGNOSIS — H4010X4 Unspecified open-angle glaucoma, indeterminate stage: Secondary | ICD-10-CM | POA: Diagnosis not present

## 2014-11-18 DIAGNOSIS — Z79899 Other long term (current) drug therapy: Secondary | ICD-10-CM | POA: Diagnosis not present

## 2014-11-18 DIAGNOSIS — I10 Essential (primary) hypertension: Secondary | ICD-10-CM | POA: Diagnosis not present

## 2014-11-18 DIAGNOSIS — G4730 Sleep apnea, unspecified: Secondary | ICD-10-CM | POA: Diagnosis not present

## 2014-11-18 DIAGNOSIS — T85310A Breakdown (mechanical) of prosthetic orbit of right eye, initial encounter: Secondary | ICD-10-CM | POA: Diagnosis not present

## 2014-11-18 DIAGNOSIS — Z961 Presence of intraocular lens: Secondary | ICD-10-CM | POA: Diagnosis not present

## 2014-11-18 DIAGNOSIS — Z9841 Cataract extraction status, right eye: Secondary | ICD-10-CM | POA: Diagnosis not present

## 2014-11-18 DIAGNOSIS — E119 Type 2 diabetes mellitus without complications: Secondary | ICD-10-CM | POA: Diagnosis not present

## 2014-11-18 DIAGNOSIS — Z885 Allergy status to narcotic agent status: Secondary | ICD-10-CM | POA: Diagnosis not present

## 2014-11-18 DIAGNOSIS — Z9109 Other allergy status, other than to drugs and biological substances: Secondary | ICD-10-CM | POA: Diagnosis not present

## 2014-11-18 DIAGNOSIS — H4011X4 Primary open-angle glaucoma, indeterminate stage: Secondary | ICD-10-CM | POA: Diagnosis not present

## 2014-11-18 DIAGNOSIS — Z7982 Long term (current) use of aspirin: Secondary | ICD-10-CM | POA: Diagnosis not present

## 2014-11-18 DIAGNOSIS — T85398A Other mechanical complication of other ocular prosthetic devices, implants and grafts, initial encounter: Secondary | ICD-10-CM | POA: Diagnosis not present

## 2014-11-18 DIAGNOSIS — Z9842 Cataract extraction status, left eye: Secondary | ICD-10-CM | POA: Diagnosis not present

## 2014-11-18 DIAGNOSIS — H3530 Unspecified macular degeneration: Secondary | ICD-10-CM | POA: Diagnosis not present

## 2014-11-18 DIAGNOSIS — T85698A Other mechanical complication of other specified internal prosthetic devices, implants and grafts, initial encounter: Secondary | ICD-10-CM | POA: Diagnosis not present

## 2014-12-08 ENCOUNTER — Other Ambulatory Visit: Payer: Self-pay

## 2014-12-18 DIAGNOSIS — Z885 Allergy status to narcotic agent status: Secondary | ICD-10-CM | POA: Diagnosis not present

## 2014-12-18 DIAGNOSIS — Z888 Allergy status to other drugs, medicaments and biological substances status: Secondary | ICD-10-CM | POA: Diagnosis not present

## 2014-12-18 DIAGNOSIS — I10 Essential (primary) hypertension: Secondary | ICD-10-CM | POA: Diagnosis not present

## 2014-12-18 DIAGNOSIS — Z7982 Long term (current) use of aspirin: Secondary | ICD-10-CM | POA: Diagnosis not present

## 2014-12-18 DIAGNOSIS — E785 Hyperlipidemia, unspecified: Secondary | ICD-10-CM | POA: Diagnosis not present

## 2014-12-18 DIAGNOSIS — G4730 Sleep apnea, unspecified: Secondary | ICD-10-CM | POA: Diagnosis not present

## 2014-12-18 DIAGNOSIS — H4010X3 Unspecified open-angle glaucoma, severe stage: Secondary | ICD-10-CM | POA: Diagnosis not present

## 2014-12-18 DIAGNOSIS — Z8673 Personal history of transient ischemic attack (TIA), and cerebral infarction without residual deficits: Secondary | ICD-10-CM | POA: Diagnosis not present

## 2014-12-18 DIAGNOSIS — H4011X3 Primary open-angle glaucoma, severe stage: Secondary | ICD-10-CM | POA: Diagnosis not present

## 2014-12-18 DIAGNOSIS — Z882 Allergy status to sulfonamides status: Secondary | ICD-10-CM | POA: Diagnosis not present

## 2014-12-18 DIAGNOSIS — E119 Type 2 diabetes mellitus without complications: Secondary | ICD-10-CM | POA: Diagnosis not present

## 2014-12-18 DIAGNOSIS — Z79899 Other long term (current) drug therapy: Secondary | ICD-10-CM | POA: Diagnosis not present

## 2014-12-19 DIAGNOSIS — Z9889 Other specified postprocedural states: Secondary | ICD-10-CM | POA: Diagnosis not present

## 2014-12-19 DIAGNOSIS — H4011X4 Primary open-angle glaucoma, indeterminate stage: Secondary | ICD-10-CM | POA: Diagnosis not present

## 2015-01-03 ENCOUNTER — Other Ambulatory Visit: Payer: Self-pay | Admitting: Internal Medicine

## 2015-01-05 ENCOUNTER — Other Ambulatory Visit: Payer: Self-pay

## 2015-01-05 MED ORDER — LISINOPRIL-HYDROCHLOROTHIAZIDE 20-12.5 MG PO TABS: ORAL_TABLET | ORAL | Status: DC

## 2015-01-05 MED ORDER — LOVASTATIN 20 MG PO TABS: 20.0000 mg | ORAL_TABLET | Freq: Every day | ORAL | Status: DC

## 2015-02-18 DIAGNOSIS — H4011X4 Primary open-angle glaucoma, indeterminate stage: Secondary | ICD-10-CM | POA: Diagnosis not present

## 2015-02-18 DIAGNOSIS — Z9889 Other specified postprocedural states: Secondary | ICD-10-CM | POA: Diagnosis not present

## 2015-02-27 ENCOUNTER — Other Ambulatory Visit: Payer: Self-pay | Admitting: Internal Medicine

## 2015-03-11 DIAGNOSIS — Z23 Encounter for immunization: Secondary | ICD-10-CM | POA: Diagnosis not present

## 2015-03-13 ENCOUNTER — Telehealth: Payer: Self-pay | Admitting: Internal Medicine

## 2015-03-13 NOTE — Telephone Encounter (Signed)
Patient received a flu shot at [facility] on 9/28 and they should be sending you something about it

## 2015-03-26 DIAGNOSIS — E119 Type 2 diabetes mellitus without complications: Secondary | ICD-10-CM | POA: Diagnosis not present

## 2015-03-26 DIAGNOSIS — H401134 Primary open-angle glaucoma, bilateral, indeterminate stage: Secondary | ICD-10-CM | POA: Diagnosis not present

## 2015-03-26 DIAGNOSIS — Z961 Presence of intraocular lens: Secondary | ICD-10-CM | POA: Diagnosis not present

## 2015-03-26 DIAGNOSIS — B309 Viral conjunctivitis, unspecified: Secondary | ICD-10-CM | POA: Diagnosis not present

## 2015-03-26 DIAGNOSIS — H532 Diplopia: Secondary | ICD-10-CM | POA: Diagnosis not present

## 2015-04-24 DIAGNOSIS — Z961 Presence of intraocular lens: Secondary | ICD-10-CM | POA: Diagnosis not present

## 2015-04-24 DIAGNOSIS — E119 Type 2 diabetes mellitus without complications: Secondary | ICD-10-CM | POA: Diagnosis not present

## 2015-04-24 DIAGNOSIS — H209 Unspecified iridocyclitis: Secondary | ICD-10-CM | POA: Diagnosis not present

## 2015-04-24 DIAGNOSIS — H401134 Primary open-angle glaucoma, bilateral, indeterminate stage: Secondary | ICD-10-CM | POA: Diagnosis not present

## 2015-04-27 DIAGNOSIS — H2012 Chronic iridocyclitis, left eye: Secondary | ICD-10-CM | POA: Diagnosis not present

## 2015-04-27 DIAGNOSIS — H2011 Chronic iridocyclitis, right eye: Secondary | ICD-10-CM | POA: Diagnosis not present

## 2015-04-27 DIAGNOSIS — H35352 Cystoid macular degeneration, left eye: Secondary | ICD-10-CM | POA: Diagnosis not present

## 2015-04-27 DIAGNOSIS — H43812 Vitreous degeneration, left eye: Secondary | ICD-10-CM | POA: Diagnosis not present

## 2015-04-27 DIAGNOSIS — H43813 Vitreous degeneration, bilateral: Secondary | ICD-10-CM | POA: Diagnosis not present

## 2015-04-27 DIAGNOSIS — H35351 Cystoid macular degeneration, right eye: Secondary | ICD-10-CM | POA: Diagnosis not present

## 2015-04-27 DIAGNOSIS — E119 Type 2 diabetes mellitus without complications: Secondary | ICD-10-CM | POA: Diagnosis not present

## 2015-04-27 DIAGNOSIS — H2013 Chronic iridocyclitis, bilateral: Secondary | ICD-10-CM | POA: Diagnosis not present

## 2015-04-27 DIAGNOSIS — H43811 Vitreous degeneration, right eye: Secondary | ICD-10-CM | POA: Diagnosis not present

## 2015-04-27 DIAGNOSIS — H35353 Cystoid macular degeneration, bilateral: Secondary | ICD-10-CM | POA: Diagnosis not present

## 2015-04-29 ENCOUNTER — Ambulatory Visit (INDEPENDENT_AMBULATORY_CARE_PROVIDER_SITE_OTHER): Payer: Medicare Other | Admitting: Internal Medicine

## 2015-04-29 ENCOUNTER — Ambulatory Visit (INDEPENDENT_AMBULATORY_CARE_PROVIDER_SITE_OTHER)
Admission: RE | Admit: 2015-04-29 | Discharge: 2015-04-29 | Disposition: A | Discharge status: Home / Self Care | Payer: Medicare Other | Source: Ambulatory Visit | Attending: Internal Medicine | Admitting: Internal Medicine

## 2015-04-29 ENCOUNTER — Other Ambulatory Visit (INDEPENDENT_AMBULATORY_CARE_PROVIDER_SITE_OTHER): Payer: Medicare Other

## 2015-04-29 ENCOUNTER — Encounter: Payer: Self-pay | Admitting: Internal Medicine

## 2015-04-29 VITALS — BP 132/82 | HR 70 | Temp 97.8°F | Ht 64.0 in | Wt 236.0 lb

## 2015-04-29 DIAGNOSIS — E119 Type 2 diabetes mellitus without complications: Secondary | ICD-10-CM

## 2015-04-29 DIAGNOSIS — I10 Essential (primary) hypertension: Secondary | ICD-10-CM | POA: Diagnosis not present

## 2015-04-29 DIAGNOSIS — H348322 Tributary (branch) retinal vein occlusion, left eye, stable: Secondary | ICD-10-CM

## 2015-04-29 DIAGNOSIS — H34832 Tributary (branch) retinal vein occlusion, left eye: Secondary | ICD-10-CM

## 2015-04-29 LAB — HEMOGLOBIN A1C: Hgb A1c MFr Bld: 7.3 % — ABNORMAL HIGH (ref 4.6–6.5)

## 2015-04-29 MED ORDER — PIOGLITAZONE HCL-METFORMIN HCL 15-500 MG PO TABS: 1.0000 | ORAL_TABLET | Freq: Two times a day (BID) | ORAL | Status: DC

## 2015-04-29 NOTE — Patient Instructions (Addendum)
OK to increase the actosplus to twice per day  Please continue all other medications as before, and refills have been done if requested.  Please have the pharmacy call with any other refills you may need.  Please continue your efforts at being more active, low cholesterol diet, and weight control.  Please keep your appointments with your specialists as you may have planned  Please go to the XRAY Department in the Basement (go straight as you get off the elevator) for the x-ray testing  Please go to the LAB in the Basement (turn left off the elevator) for the tests to be done today  You will be contacted by phone if any changes need to be made immediately.  Otherwise, you will receive a letter about your results with an explanation  Please return in 6 months, or sooner if needed

## 2015-04-29 NOTE — Progress Notes (Signed)
Subjective:    Patient ID: [name], female    DOB: February 06, 1946, 69 y.o.   MRN: [id number]  HPI  Here to f/u; overall doing ok,  Pt denies chest pain, increasing sob or doe, wheezing, orthopnea, PND, increased LE swelling, palpitations, dizziness or syncope.  Pt denies new neurological symptoms such as new headache, or facial or extremity weakness or numbness.  Pt denies polydipsia, polyuria, or low sugar episode.   Pt denies new neurological symptoms such as new headache, or facial or extremity weakness or numbness.   Pt states overall good compliance with meds, mostly trying to follow appropriate diet, with wt overall stable,  but little exercise however.  Had to stop metformin due to GI upset, but still taking the actosplusmet, and CBG's in the 160's.  Needs CXR with hx of uvieitis recommended per optho.  Has worsening vision, now very low, not clear how much she might get back.  Past Medical History  Diagnosis Date  . HYPERLIPIDEMIA 01/04/2007    taking Pravastatin daily  . Overweight(278.02) 01/04/2007  . ANXIETY 01/04/2007  . GLAUCOMA 07/30/2008  . OTITIS MEDIA, ACUTE, BILATERAL 02/29/2008  . OSTEOARTHRITIS, HIP 09/25/2009  . LEG PAIN, LEFT 07/06/2007  . Dizziness and giddiness 02/29/2008  . NUMBNESS 07/30/2008    in fingers;pt states from Diamox  . TRANSIENT ISCHEMIC ATTACK, HX OF 01/04/2007  . DVT, HX OF     at age 56 in right buttocks  . PONV (postoperative nausea and vomiting)   . GERD 01/04/2007    pt reports resolved with weight loss  . Vision loss     left eye  . History of blood transfusion     no abnormal  reaction  . HYPERTENSION 01/04/2007    takes Lisinopril daily  . Joint pain   . Joint swelling   . DIABETES MELLITUS, TYPE II 01/04/2007    only takes actoplus daily  . SLEEP APNEA, OBSTRUCTIVE     doesn't use a cpap;study done about 5yrs ago   Past Surgical History  Procedure Laterality Date  . Cholecystectomy    . Oophorectomy    . Knee arthroscopy Right   .  Growth removal  2004    from thumb  . Total hip arthroplasty  06/24/2011    Procedure: TOTAL HIP ARTHROPLASTY;  Surgeon: Kerin Salen;  Location: [facility];  Service: Orthopedics;  Laterality: Right;  . Abdominal hysterectomy  2000  . Eye surgery  13    shunt left and lazer eye surgery on right cataract and retenia tear with repair  . Total hip arthroplasty Left 11/12/2012    Dr Mayer Camel  . Total hip arthroplasty Left 11/12/2012    Procedure: TOTAL HIP ARTHROPLASTY;  Surgeon: Kerin Salen, MD;  Location: [facility];  Service: Orthopedics;  Laterality: Left;  DEPUY PINNACLE    reports that she has never smoked. She has never used smokeless tobacco. She reports that she does not drink alcohol or use illicit drugs. family history includes Cancer in her mother; Dementia in her mother; Stroke in her sister. There is no history of Anesthesia problems. Allergies  Allergen Reactions  . Lipitor [Atorvastatin Calcium]     Leg cramp  . Oxycodone Nausea And Vomiting    Patient vomited for 3 days after taking  . Sitagliptin Phosphate Nausea And Vomiting  . Sulfa Drugs Cross Reactors Nausea And Vomiting   Current Outpatient Prescriptions on File Prior to Visit  Medication Sig Dispense Refill  . aspirin  81 MG tablet Take 81 mg by mouth daily.    . Brimonidine Tartrate-Timolol (COMBIGAN OP) Place 1 drop into both eyes 2 (two) times daily.     . brimonidine-timolol (COMBIGAN) 0.2-0.5 % ophthalmic solution Administer 1 drop to both eyes every twelve (12) hours. for 25 days    . dorzolamide (TRUSOPT) 2 % ophthalmic solution Place 1 drop into both eyes 2 (two) times daily.    Marland Kitchen lisinopril-hydrochlorothiazide (PRINZIDE,ZESTORETIC) 20-12.5 MG per tablet TAKE ONE TABLET BY MOUTH EVERY DAY 90 tablet 3  . lovastatin (MEVACOR) 20 MG tablet Take 1 tablet (20 mg total) by mouth at bedtime. 90 tablet 3  . acetaminophen (TYLENOL) 500 MG tablet Take 1,000 mg by mouth every 6 (six) hours as needed for pain.    . benzonatate  (TESSALON) 100 MG capsule Take 1-2 capsules (100-200 mg total) by mouth 3 (three) times daily as needed for cough. (Patient not taking: Reported on 04/29/2015) 40 capsule 0  . bimatoprost (LUMIGAN) 0.03 % ophthalmic solution Place 1 drop into both eyes at bedtime.    Marland Kitchen latanoprost (XALATAN) 0.005 % ophthalmic solution Place 1 drop into both eyes at bedtime.     No current facility-administered medications on file prior to visit.    Review of Systems  Constitutional: Negative for unusual diaphoresis or night sweats HENT: Negative for ringing in ear or discharge Eyes: Negative for double vision or worsening visual disturbance.  Respiratory: Negative for choking and stridor.   Gastrointestinal: Negative for vomiting or other signifcant bowel change Genitourinary: Negative for hematuria or change in urine volume.  Musculoskeletal: Negative for other MSK pain or swelling Skin: Negative for color change and worsening wound.  Neurological: Negative for tremors and numbness other than noted  Psychiatric/Behavioral: Negative for decreased concentration or agitation other than above       Objective:   Physical Exam BP 132/82 mmHg  Pulse 70  Temp(Src) 97.8 F (36.6 C) (Oral)  Ht 5\' 4"  (1.626 m)  Wt 236 lb (107.049 kg)  BMI 40.49 kg/m2  SpO2 97% VS noted,  Constitutional: Pt appears in no significant distress HENT: Head: NCAT.  Right Ear: External ear normal.  Left Ear: External ear normal.  Eyes: . Pupils are equal, round, and reactive to light. Conjunctivae and EOM are normal Neck: Normal range of motion. Neck supple.  Cardiovascular: Normal rate and regular rhythm.   Pulmonary/Chest: Effort normal and breath sounds without rales or wheezing.  Abd:  Soft, NT, ND, + BS Neurological: Pt is alert. Not confused , motor grossly intact Skin: Skin is warm. No rash, no LE edema Psychiatric: Pt behavior is normal. No agitation.     Assessment & Plan:

## 2015-04-30 ENCOUNTER — Other Ambulatory Visit: Payer: Self-pay | Admitting: Internal Medicine

## 2015-04-30 LAB — HEPATIC FUNCTION PANEL
ALT: 15 U/L (ref 0–35)
AST: 12 U/L (ref 0–37)
Albumin: 4.1 g/dL (ref 3.5–5.2)
Alkaline Phosphatase: 59 U/L (ref 39–117)
Bilirubin, Direct: 0 mg/dL (ref 0.0–0.3)
Total Bilirubin: 0.4 mg/dL (ref 0.2–1.2)
Total Protein: 7.2 g/dL (ref 6.0–8.3)

## 2015-04-30 LAB — LIPID PANEL
Cholesterol: 195 mg/dL (ref 0–200)
HDL: 52.4 mg/dL (ref >39.00)
LDL Cholesterol: 117 mg/dL — ABNORMAL HIGH (ref 0–99)
NonHDL: 142.95
Total CHOL/HDL Ratio: 4
Triglycerides: 129 mg/dL (ref 0.0–149.0)
VLDL: 25.8 mg/dL (ref 0.0–40.0)

## 2015-04-30 LAB — BASIC METABOLIC PANEL
BUN: 13 mg/dL (ref 6–23)
CO2: 27 mEq/L (ref 19–32)
Calcium: 9.3 mg/dL (ref 8.4–10.5)
Chloride: 106 mEq/L (ref 96–112)
Creatinine, Ser: 0.88 mg/dL (ref 0.40–1.20)
GFR: 67.63 mL/min (ref >60.00)
Glucose, Bld: 121 mg/dL — ABNORMAL HIGH (ref 70–99)
Potassium: 4.3 mEq/L (ref 3.5–5.1)
Sodium: 141 mEq/L (ref 135–145)

## 2015-04-30 MED ORDER — LOVASTATIN 40 MG PO TABS: 40.0000 mg | ORAL_TABLET | Freq: Every day | ORAL | Status: DC

## 2015-05-01 ENCOUNTER — Telehealth: Payer: Self-pay | Admitting: Internal Medicine

## 2015-05-01 NOTE — Telephone Encounter (Signed)
Pt advised of lab results.  

## 2015-05-01 NOTE — Telephone Encounter (Signed)
Pt call back request copy of the result to be forward to Dr. Lady Gary Fax # [phone]

## 2015-05-01 NOTE — Telephone Encounter (Signed)
done

## 2015-05-01 NOTE — Telephone Encounter (Signed)
Please call pt. She returned your call

## 2015-05-02 DIAGNOSIS — H348322 Tributary (branch) retinal vein occlusion, left eye, stable: Secondary | ICD-10-CM | POA: Insufficient documentation

## 2015-05-02 DIAGNOSIS — H34832 Tributary (branch) retinal vein occlusion, left eye: Secondary | ICD-10-CM | POA: Insufficient documentation

## 2015-05-02 DIAGNOSIS — H209 Unspecified iridocyclitis: Secondary | ICD-10-CM | POA: Insufficient documentation

## 2015-05-02 NOTE — Assessment & Plan Note (Signed)
stable overall by history and exam, recent data reviewed with pt, and pt to continue medical treatment as before,  to f/u any worsening symptoms or concerns Lab Results  Component Value Date   HGBA1C 7.3* 04/29/2015   For f/u lab

## 2015-05-02 NOTE — Assessment & Plan Note (Signed)
Pt mentions uveitis as well, will have cxr as recommended per optho writtent request today,  to f/u any worsening symptoms or concerns

## 2015-05-02 NOTE — Assessment & Plan Note (Signed)
Per pt, for cxr as recommended per opthomology to complete evaluation

## 2015-05-02 NOTE — Assessment & Plan Note (Signed)
stable overall by history and exam, recent data reviewed with pt, and pt to continue medical treatment as before,  to f/u any worsening symptoms or concerns BP Readings from Last 3 Encounters:  04/29/15 132/82  10/30/14 160/92  01/03/14 124/70

## 2015-05-12 DIAGNOSIS — H35353 Cystoid macular degeneration, bilateral: Secondary | ICD-10-CM | POA: Diagnosis not present

## 2015-05-12 DIAGNOSIS — H2013 Chronic iridocyclitis, bilateral: Secondary | ICD-10-CM | POA: Diagnosis not present

## 2015-05-22 DIAGNOSIS — H209 Unspecified iridocyclitis: Secondary | ICD-10-CM | POA: Diagnosis not present

## 2015-05-22 DIAGNOSIS — E119 Type 2 diabetes mellitus without complications: Secondary | ICD-10-CM | POA: Diagnosis not present

## 2015-05-22 DIAGNOSIS — Z961 Presence of intraocular lens: Secondary | ICD-10-CM | POA: Diagnosis not present

## 2015-05-22 DIAGNOSIS — H401134 Primary open-angle glaucoma, bilateral, indeterminate stage: Secondary | ICD-10-CM | POA: Diagnosis not present

## 2015-05-22 LAB — HM DIABETES EYE EXAM

## 2015-05-29 ENCOUNTER — Telehealth: Payer: Self-pay | Admitting: Internal Medicine

## 2015-05-29 NOTE — Telephone Encounter (Signed)
Rec'd from Ophthalmic Exam forward 8 pages to Dr.James

## 2015-06-12 ENCOUNTER — Ambulatory Visit (INDEPENDENT_AMBULATORY_CARE_PROVIDER_SITE_OTHER): Payer: Medicare Other | Admitting: Family Medicine

## 2015-06-12 VITALS — BP 136/60 | HR 83 | Temp 98.5°F | Resp 18 | Ht 64.0 in | Wt 237.0 lb

## 2015-06-12 DIAGNOSIS — H409 Unspecified glaucoma: Secondary | ICD-10-CM | POA: Diagnosis not present

## 2015-06-12 DIAGNOSIS — R05 Cough: Secondary | ICD-10-CM

## 2015-06-12 DIAGNOSIS — J22 Unspecified acute lower respiratory infection: Secondary | ICD-10-CM

## 2015-06-12 DIAGNOSIS — R059 Cough, unspecified: Secondary | ICD-10-CM

## 2015-06-12 DIAGNOSIS — J988 Other specified respiratory disorders: Secondary | ICD-10-CM | POA: Diagnosis not present

## 2015-06-12 MED ORDER — AMOXICILLIN 875 MG PO TABS: 875.0000 mg | ORAL_TABLET | Freq: Two times a day (BID) | ORAL | Status: DC

## 2015-06-12 MED ORDER — BENZONATATE 100 MG PO CAPS: 200.0000 mg | ORAL_CAPSULE | Freq: Two times a day (BID) | ORAL | Status: DC | PRN

## 2015-06-12 NOTE — Progress Notes (Signed)
Chief Complaint:  Chief Complaint  Patient presents with  . Cough    x 1 week  . Sore Throat  . Nasal Congestion    HPI: [name] is a 69 y.o. female who reports to [facility] [place] today complaining of URI sxs x 1 week, she has had had sinu pressure and nasal  congestion, cold sxs, coughing , sore throat. NO fevers or chills. Feels run down. Denies CP or SOB She feels it is going down her chest, cough is dry, she ahs Dm and also glaucoma OSA   And GERD, has tried otc meds without releif.    Past Medical History  Diagnosis Date  . HYPERLIPIDEMIA 01/04/2007    taking Pravastatin daily  . Overweight(278.02) 01/04/2007  . ANXIETY 01/04/2007  . GLAUCOMA 07/30/2008  . OTITIS MEDIA, ACUTE, BILATERAL 02/29/2008  . OSTEOARTHRITIS, HIP 09/25/2009  . LEG PAIN, LEFT 07/06/2007  . Dizziness and giddiness 02/29/2008  . NUMBNESS 07/30/2008    in fingers;pt states from Diamox  . TRANSIENT ISCHEMIC ATTACK, HX OF 01/04/2007  . DVT, HX OF     at age 20 in right buttocks  . PONV (postoperative nausea and vomiting)   . GERD 01/04/2007    pt reports resolved with weight loss  . Vision loss     left eye  . History of blood transfusion     no abnormal  reaction  . HYPERTENSION 01/04/2007    takes Lisinopril daily  . Joint pain   . Joint swelling   . DIABETES MELLITUS, TYPE II 01/04/2007    only takes actoplus daily  . SLEEP APNEA, OBSTRUCTIVE     doesn't use a cpap;study done about 56yr ago   Past Surgical History  Procedure Laterality Date  . Cholecystectomy    . Oophorectomy    . Knee arthroscopy Right   . Growth removal  2004    from thumb  . Total hip arthroplasty  06/24/2011    Procedure: TOTAL HIP ARTHROPLASTY;  Surgeon: FKerin Salen  Location[place] [facility]  Service: Orthopedics;  Laterality: Right;  . Abdominal hysterectomy  2000  . Eye surgery  13    shunt left and lazer eye surgery on right cataract and retenia tear with repair  . Total hip arthroplasty Left 11/12/2012    Dr RMayer Camel .  Total hip arthroplasty Left 11/12/2012    Procedure: TOTAL HIP ARTHROPLASTY;  Surgeon: FKerin Salen MD;  Location[facility]  Service: Orthopedics;  Laterality: Left;  DEPUY PINNACLE   Social History   Social History  . Marital Status: Married    Spouse Name: N/A  . Number of Children: N/A  . Years of Education: N/A   Occupational History  . office administrator    Social History Main Topics  . Smoking status: Never Smoker   . Smokeless tobacco: Never Used  . Alcohol Use: No  . Drug Use: No  . Sexual Activity: Not Currently   Other Topics Concern  . None   Social History Narrative   Family History  Problem Relation Age of Onset  . Dementia Mother   . Cancer Mother     Breast and lung cancer  . Stroke Sister   . Anesthesia problems Neg Hx    Allergies  Allergen Reactions  . Lipitor [Atorvastatin Calcium]     Leg cramp  . Oxycodone Nausea And Vomiting    Patient vomited for 3 days after taking  . Sitagliptin Phosphate Nausea  And Vomiting  . Sulfa Drugs Cross Reactors Nausea And Vomiting   Prior to Admission medications   Medication Sig Start Date End Date Taking? Authorizing Provider  bimatoprost (LUMIGAN) 0.03 % ophthalmic solution Place 1 drop into both eyes at bedtime.   Yes Historical Provider, MD  Brimonidine Tartrate-Timolol (COMBIGAN OP) Place 1 drop into both eyes 2 (two) times daily.    Yes Historical Provider, MD  brimonidine-timolol (COMBIGAN) 0.2-0.5 % ophthalmic solution Administer 1 drop to both eyes every twelve (12) hours. for 25 days 01/23/14  Yes Historical Provider, MD  Bromfenac Sodium (PROLENSA OP) Apply to eye.   Yes Historical Provider, MD  dorzolamide (TRUSOPT) 2 % ophthalmic solution Place 1 drop into both eyes 2 (two) times daily.   Yes Historical Provider, MD  lisinopril-hydrochlorothiazide (PRINZIDE,ZESTORETIC) 20-12.5 MG per tablet TAKE ONE TABLET BY MOUTH EVERY DAY 01/05/15  Yes Biagio Borg, MD  lovastatin (MEVACOR) 40 MG tablet Take 1 tablet  (40 mg total) by mouth at bedtime. 04/30/15  Yes Biagio Borg, MD  pioglitazone-metformin (ACTOPLUS MET) 15-500 MG tablet Take 1 tablet by mouth 2 (two) times daily with a meal. 04/29/15  Yes Biagio Borg, MD  prednisoLONE acetate (PRED FORTE) 1 % ophthalmic suspension 1 drop 4 (four) times daily.   Yes Historical Provider, MD  acetaminophen (TYLENOL) 500 MG tablet Take 1,000 mg by mouth every 6 (six) hours as needed for pain. Reported on 06/12/2015    Historical Provider, MD  aspirin 81 MG tablet Take 81 mg by mouth daily. Reported on 06/12/2015    Historical Provider, MD  benzonatate (TESSALON) 100 MG capsule Take 1-2 capsules (100-200 mg total) by mouth 3 (three) times daily as needed for cough. Patient not taking: Reported on 04/29/2015 01/03/14   Darlyne Russian, MD  latanoprost (XALATAN) 0.005 % ophthalmic solution Place 1 drop into both eyes at bedtime. Reported on 06/12/2015    Historical Provider, MD     ROS: The patient denies fevers, chills, night sweats, unintentional weight loss, chest pain, palpitations, wheezing, dyspnea on exertion, nausea, vomiting, abdominal pain, dysuria, hematuria, melena, numbness, weakness, or tingling.   All other systems have been reviewed and were otherwise negative with the exception of those mentioned in the HPI and as above.    PHYSICAL EXAM: Filed Vitals:   06/12/15 1216  BP: 136/60  Pulse: 83  Temp: 98.5 F (36.9 C)  Resp: 18   Body mass index is 40.66 kg/(m^2). SpO2 Readings from Last 3 Encounters:  06/12/15 97%  04/29/15 97%  10/30/14 96%     General: Alert, no acute distress HEENT:  Normocephalic, atraumatic, oropharynx patent. EOMI, PERRLA Erythematous throat, no exudates, TM non infectious, ? Left Tm is ruptured? , + sinus tenderness, + erythematous/boggy nasal mucosa Cardiovascular:  Regular rate and rhythm, no rubs murmurs or gallops.   Respiratory: Clear to auscultation bilaterally.  No wheezes, rales, or rhonchi.  No cyanosis,  no use of accessory musculature Abdominal: No organomegaly, abdomen is soft and non-tender, positive bowel sounds. No masses. Skin: No rashes. Neurologic: Facial musculature symmetric. Psychiatric: Patient acts appropriately throughout our interaction. Lymphatic: No cervical or submandibular lymphadenopathy Musculoskeletal: Gait intact.    LABS: Results for orders placed or performed in visit on 04/29/15  Hemoglobin A1c  Result Value Ref Range   Hgb A1c MFr Bld 7.3 (H) 4.6 - 6.5 %  Lipid panel  Result Value Ref Range   Cholesterol 195 0 - 200 mg/dL   Triglycerides 129.0 0.0 -  149.0 mg/dL   HDL 52.40 >39.00 mg/dL   VLDL 25.8 0.0 - 40.0 mg/dL   LDL Cholesterol 117 (H) 0 - 99 mg/dL   Total CHOL/HDL Ratio 4    NonHDL 646.80   Basic metabolic panel  Result Value Ref Range   Sodium 141 135 - 145 mEq/L   Potassium 4.3 3.5 - 5.1 mEq/L   Chloride 106 96 - 112 mEq/L   CO2 27 19 - 32 mEq/L   Glucose, Bld 121 (H) 70 - 99 mg/dL   BUN 13 6 - 23 mg/dL   Creatinine, Ser 0.88 0.40 - 1.20 mg/dL   Calcium 9.3 8.4 - 10.5 mg/dL   GFR 67.63 >60.00 mL/min  Hepatic function panel  Result Value Ref Range   Total Bilirubin 0.4 0.2 - 1.2 mg/dL   Bilirubin, Direct 0.0 0.0 - 0.3 mg/dL   Alkaline Phosphatase 59 39 - 117 U/L   AST 12 0 - 37 U/L   ALT 15 0 - 35 U/L   Total Protein 7.2 6.0 - 8.3 g/dL   Albumin 4.1 3.5 - 5.2 g/dL     EKG/XRAY:   Primary read interpreted by Dr. Marin Comment at [facility].   ASSESSMENT/PLAN: Encounter Diagnoses  Name Primary?  . Cough   . Lower respiratory infection (e.g., bronchitis, pneumonia, pneumonitis, pulmonitis) Yes  . Glaucoma     Rx amox, tessalon perles Otc nasal saline , no nsal steroids due to glaucoma for now Gross sideeffects, risk and benefits, and alternatives of medications d/w patient. Patient is aware that all medications have potential sideeffects and we are unable to predict every sideeffect or drug-drug interaction that may occur.  Rozell Theiler DO    06/12/2015 8:48 PM

## 2015-06-24 DIAGNOSIS — H2013 Chronic iridocyclitis, bilateral: Secondary | ICD-10-CM | POA: Diagnosis not present

## 2015-07-21 ENCOUNTER — Telehealth: Payer: Self-pay | Admitting: Internal Medicine

## 2015-07-21 NOTE — Telephone Encounter (Signed)
Received medical records from [place] forwarded 4 pages to Dr. Cathlean Cower 07/21/15 fbg

## 2015-08-04 DIAGNOSIS — H2013 Chronic iridocyclitis, bilateral: Secondary | ICD-10-CM | POA: Diagnosis not present

## 2015-08-25 DIAGNOSIS — Z961 Presence of intraocular lens: Secondary | ICD-10-CM | POA: Diagnosis not present

## 2015-08-25 DIAGNOSIS — H401134 Primary open-angle glaucoma, bilateral, indeterminate stage: Secondary | ICD-10-CM | POA: Diagnosis not present

## 2015-08-25 DIAGNOSIS — H209 Unspecified iridocyclitis: Secondary | ICD-10-CM | POA: Diagnosis not present

## 2015-08-27 DIAGNOSIS — M25552 Pain in left hip: Secondary | ICD-10-CM | POA: Diagnosis not present

## 2015-08-28 ENCOUNTER — Other Ambulatory Visit (HOSPITAL_COMMUNITY): Payer: Self-pay | Admitting: Orthopedic Surgery

## 2015-08-28 DIAGNOSIS — M25552 Pain in left hip: Secondary | ICD-10-CM

## 2015-09-09 ENCOUNTER — Encounter (HOSPITAL_COMMUNITY)
Admission: RE | Admit: 2015-09-09 | Discharge: 2015-09-09 | Disposition: A | Discharge status: Home / Self Care | Payer: Medicare Other | Source: Ambulatory Visit | Attending: Orthopedic Surgery | Admitting: Orthopedic Surgery

## 2015-09-09 ENCOUNTER — Encounter (HOSPITAL_COMMUNITY): Payer: Medicare Other

## 2015-09-09 DIAGNOSIS — M25552 Pain in left hip: Secondary | ICD-10-CM | POA: Insufficient documentation

## 2015-09-09 MED ORDER — TECHNETIUM TC 99M MEDRONATE IV KIT
25.0000 | PACK | Freq: Once | INTRAVENOUS | Status: AC | PRN
  Administered 2015-09-09: 25 via INTRAVENOUS

## 2015-09-15 DIAGNOSIS — M25552 Pain in left hip: Secondary | ICD-10-CM | POA: Diagnosis not present

## 2015-10-06 DIAGNOSIS — H2013 Chronic iridocyclitis, bilateral: Secondary | ICD-10-CM | POA: Diagnosis not present

## 2015-10-19 DIAGNOSIS — Z961 Presence of intraocular lens: Secondary | ICD-10-CM | POA: Diagnosis not present

## 2015-10-19 DIAGNOSIS — H209 Unspecified iridocyclitis: Secondary | ICD-10-CM | POA: Diagnosis not present

## 2015-10-19 DIAGNOSIS — H353132 Nonexudative age-related macular degeneration, bilateral, intermediate dry stage: Secondary | ICD-10-CM | POA: Diagnosis not present

## 2015-10-19 DIAGNOSIS — H401134 Primary open-angle glaucoma, bilateral, indeterminate stage: Secondary | ICD-10-CM | POA: Diagnosis not present

## 2015-11-03 ENCOUNTER — Encounter: Payer: Medicare Other | Admitting: Internal Medicine

## 2015-12-02 DIAGNOSIS — H2013 Chronic iridocyclitis, bilateral: Secondary | ICD-10-CM | POA: Diagnosis not present

## 2015-12-02 LAB — HM DIABETES EYE EXAM

## 2016-01-09 ENCOUNTER — Other Ambulatory Visit: Payer: Self-pay | Admitting: Internal Medicine

## 2016-01-13 DIAGNOSIS — Z1231 Encounter for screening mammogram for malignant neoplasm of breast: Secondary | ICD-10-CM | POA: Diagnosis not present

## 2016-01-19 ENCOUNTER — Other Ambulatory Visit: Payer: Self-pay | Admitting: Obstetrics and Gynecology

## 2016-01-19 DIAGNOSIS — R928 Other abnormal and inconclusive findings on diagnostic imaging of breast: Secondary | ICD-10-CM

## 2016-01-20 DIAGNOSIS — H401130 Primary open-angle glaucoma, bilateral, stage unspecified: Secondary | ICD-10-CM | POA: Diagnosis not present

## 2016-01-20 DIAGNOSIS — Z961 Presence of intraocular lens: Secondary | ICD-10-CM | POA: Diagnosis not present

## 2016-01-20 DIAGNOSIS — H04123 Dry eye syndrome of bilateral lacrimal glands: Secondary | ICD-10-CM | POA: Diagnosis not present

## 2016-01-22 ENCOUNTER — Other Ambulatory Visit: Payer: Self-pay | Admitting: Obstetrics and Gynecology

## 2016-01-22 ENCOUNTER — Ambulatory Visit
Admission: RE | Admit: 2016-01-22 | Discharge: 2016-01-22 | Disposition: A | Discharge status: Home / Self Care | Payer: Medicare Other | Source: Ambulatory Visit | Attending: Obstetrics and Gynecology | Admitting: Obstetrics and Gynecology

## 2016-01-22 DIAGNOSIS — N6310 Unspecified lump in the right breast, unspecified quadrant: Secondary | ICD-10-CM

## 2016-01-22 DIAGNOSIS — C50911 Malignant neoplasm of unspecified site of right female breast: Secondary | ICD-10-CM | POA: Diagnosis not present

## 2016-01-22 DIAGNOSIS — R928 Other abnormal and inconclusive findings on diagnostic imaging of breast: Secondary | ICD-10-CM

## 2016-01-22 DIAGNOSIS — N63 Unspecified lump in breast: Secondary | ICD-10-CM | POA: Diagnosis not present

## 2016-01-22 DIAGNOSIS — Z170 Estrogen receptor positive status [ER+]: Secondary | ICD-10-CM | POA: Diagnosis not present

## 2016-01-26 ENCOUNTER — Telehealth: Payer: Self-pay | Admitting: *Deleted

## 2016-01-26 DIAGNOSIS — Z170 Estrogen receptor positive status [ER+]: Secondary | ICD-10-CM | POA: Insufficient documentation

## 2016-01-26 DIAGNOSIS — C50411 Malignant neoplasm of upper-outer quadrant of right female breast: Secondary | ICD-10-CM | POA: Insufficient documentation

## 2016-01-26 NOTE — Telephone Encounter (Signed)
Confirmed [facility] for 02/03/16 at 815am .  Instructions and contact information given.

## 2016-01-26 NOTE — Telephone Encounter (Signed)
Rec'd call pt states want to let md know that she was diagnose with breast cancer in (R) breast. Also want to ask him about the ActoPlus Met. The generic is just as higher than the Keymon Mcelroy. Does Md know if it would be cheaper for Actoplus-Met? With all this going on with her body would it be any justice...[name]

## 2016-01-26 NOTE — Telephone Encounter (Signed)
Mailed clinic packet to pt.  

## 2016-01-26 NOTE — Telephone Encounter (Signed)
I doubt it, but the only to really know is for pt to ask her pharmacist, as the price for her under her insurance would only be determined there

## 2016-01-27 NOTE — Telephone Encounter (Signed)
Patient aware.

## 2016-02-02 ENCOUNTER — Other Ambulatory Visit: Payer: Self-pay | Admitting: Internal Medicine

## 2016-02-03 ENCOUNTER — Ambulatory Visit: Payer: Medicare Other

## 2016-02-03 ENCOUNTER — Encounter: Payer: Self-pay | Admitting: Skilled Nursing Facility1

## 2016-02-03 ENCOUNTER — Ambulatory Visit
Admission: RE | Admit: 2016-02-03 | Discharge: 2016-02-03 | Disposition: A | Discharge status: Home / Self Care | Payer: Medicare Other | Source: Ambulatory Visit | Attending: Radiation Oncology | Admitting: Radiation Oncology

## 2016-02-03 ENCOUNTER — Encounter: Payer: Self-pay | Admitting: Genetic Counselor

## 2016-02-03 ENCOUNTER — Telehealth: Payer: Self-pay | Admitting: Oncology

## 2016-02-03 ENCOUNTER — Ambulatory Visit: Payer: Medicare Other | Attending: Surgery | Admitting: Physical Therapy

## 2016-02-03 ENCOUNTER — Ambulatory Visit (HOSPITAL_BASED_OUTPATIENT_CLINIC_OR_DEPARTMENT_OTHER): Payer: Medicare Other | Admitting: Oncology

## 2016-02-03 ENCOUNTER — Ambulatory Visit (HOSPITAL_BASED_OUTPATIENT_CLINIC_OR_DEPARTMENT_OTHER): Payer: Medicare Other | Admitting: Genetic Counselor

## 2016-02-03 ENCOUNTER — Encounter: Payer: Self-pay | Admitting: Physical Therapy

## 2016-02-03 ENCOUNTER — Other Ambulatory Visit (HOSPITAL_BASED_OUTPATIENT_CLINIC_OR_DEPARTMENT_OTHER): Payer: Medicare Other

## 2016-02-03 VITALS — BP 146/63 | HR 57 | Temp 97.7°F | Resp 18 | Ht 64.0 in | Wt 247.7 lb

## 2016-02-03 DIAGNOSIS — C50411 Malignant neoplasm of upper-outer quadrant of right female breast: Secondary | ICD-10-CM | POA: Diagnosis not present

## 2016-02-03 DIAGNOSIS — C50911 Malignant neoplasm of unspecified site of right female breast: Secondary | ICD-10-CM | POA: Diagnosis not present

## 2016-02-03 DIAGNOSIS — Z853 Personal history of malignant neoplasm of breast: Secondary | ICD-10-CM | POA: Diagnosis not present

## 2016-02-03 DIAGNOSIS — R293 Abnormal posture: Secondary | ICD-10-CM | POA: Insufficient documentation

## 2016-02-03 DIAGNOSIS — Z803 Family history of malignant neoplasm of breast: Secondary | ICD-10-CM | POA: Diagnosis not present

## 2016-02-03 DIAGNOSIS — Z170 Estrogen receptor positive status [ER+]: Secondary | ICD-10-CM | POA: Diagnosis not present

## 2016-02-03 DIAGNOSIS — Z8542 Personal history of malignant neoplasm of other parts of uterus: Secondary | ICD-10-CM | POA: Insufficient documentation

## 2016-02-03 LAB — CBC WITH DIFFERENTIAL/PLATELET
BASO%: 0.3 % (ref 0.0–2.0)
Basophils Absolute: 0 10*3/uL (ref 0.0–0.1)
EOS%: 1.5 % (ref 0.0–7.0)
Eosinophils Absolute: 0.1 10*3/uL (ref 0.0–0.5)
HCT: 36 % (ref 34.8–46.6)
HGB: 12 g/dL (ref 11.6–15.9)
LYMPH%: 26.8 % (ref 14.0–49.7)
MCH: 27.5 pg (ref 25.1–34.0)
MCHC: 33.3 g/dL (ref 31.5–36.0)
MCV: 82.6 fL (ref 79.5–101.0)
MONO#: 0.5 10*3/uL (ref 0.1–0.9)
MONO%: 6.4 % (ref 0.0–14.0)
NEUT#: 5.1 10*3/uL (ref 1.5–6.5)
NEUT%: 65 % (ref 38.4–76.8)
Platelets: 187 10*3/uL (ref 145–400)
RBC: 4.36 10*6/uL (ref 3.70–5.45)
RDW: 14.6 % — ABNORMAL HIGH (ref 11.2–14.5)
WBC: 7.9 10*3/uL (ref 3.9–10.3)
lymph#: 2.1 10*3/uL (ref 0.9–3.3)

## 2016-02-03 LAB — COMPREHENSIVE METABOLIC PANEL
ALT: 13 U/L (ref 0–55)
AST: 12 U/L (ref 5–34)
Albumin: 3.6 g/dL (ref 3.5–5.0)
Alkaline Phosphatase: 54 U/L (ref 40–150)
Anion Gap: 10 mEq/L (ref 3–11)
BUN: 10.2 mg/dL (ref 7.0–26.0)
CO2: 26 mEq/L (ref 22–29)
Calcium: 9.1 mg/dL (ref 8.4–10.4)
Chloride: 105 mEq/L (ref 98–109)
Creatinine: 0.8 mg/dL (ref 0.6–1.1)
EGFR: 71 mL/min/{1.73_m2} — ABNORMAL LOW (ref >90)
Glucose: 200 mg/dl — ABNORMAL HIGH (ref 70–140)
Potassium: 4 mEq/L (ref 3.5–5.1)
Sodium: 141 mEq/L (ref 136–145)
Total Bilirubin: 0.48 mg/dL (ref 0.20–1.20)
Total Protein: 7.1 g/dL (ref 6.4–8.3)

## 2016-02-03 NOTE — Progress Notes (Addendum)
REFERRING PROVIDER: Biagio Borg, MD [place], [place] [address]   Lurline Del, MD   PRIMARY PROVIDER:  Cathlean Cower, MD  PRIMARY REASON FOR VISIT:  1. Breast cancer of upper-outer quadrant of right female breast ([place])   2. Family history of breast cancer   3. History of uterine cancer      HISTORY OF PRESENT ILLNESS:   [name], a 70 y.o. female, was seen for a [facility] cancer genetics consultation at the request of Dr. Jana Hakim due to a personal and family history of cancer.  [name] presents to clinic today to discuss the possibility of a hereditary predisposition to cancer, genetic testing, and to further clarify her future cancer risks, as well as potential cancer risks for family members.   In 1997, at the age of 35, [name] was diagnosed with early stage uterine cancer. This was treated with a complete hysterectomy by Dr. Fermin Schwab.  In 2017, at the age of 89, [name] was diagnosed with breast cancer of the right breast.    CANCER HISTORY:   No history exists.     HORMONAL RISK FACTORS:  Menarche was at age 34.  First live birth at age 52.  OCP use for approximately 13 years.  Ovaries intact: no.  Hysterectomy: yes.  Menopausal status: postmenopausal.  HRT use: 0 years. Colonoscopy: no; not examined. Mammogram within the last year: yes. Number of breast biopsies: 2. Up to date with pelvic exams:  no. Any excessive radiation exposure in the past:  no  Past Medical History:  Diagnosis Date  . ANXIETY 01/04/2007  . DIABETES MELLITUS, TYPE II 01/04/2007   only takes actoplus daily  . Dizziness and giddiness 02/29/2008  . DVT, HX OF    at age 89 in right buttocks  . Family history of breast cancer   . GERD 01/04/2007   pt reports resolved with weight loss  . GLAUCOMA 07/30/2008  . History of blood transfusion    no abnormal  reaction  . History of uterine cancer   . HYPERLIPIDEMIA 01/04/2007   taking Pravastatin daily  .  HYPERTENSION 01/04/2007   takes Lisinopril daily  . Joint pain   . Joint swelling   . LEG PAIN, LEFT 07/06/2007  . NUMBNESS 07/30/2008   in fingers;pt states from Diamox  . OSTEOARTHRITIS, HIP 09/25/2009  . OTITIS MEDIA, ACUTE, BILATERAL 02/29/2008  . Overweight(278.02) 01/04/2007  . PONV (postoperative nausea and vomiting)   . SLEEP APNEA, OBSTRUCTIVE    doesn't use a cpap;study done about 74yr ago  . TRANSIENT ISCHEMIC ATTACK, HX OF 01/04/2007  . Vision loss    left eye    Past Surgical History:  Procedure Laterality Date  . ABDOMINAL HYSTERECTOMY  2000  . CHOLECYSTECTOMY    . EYE SURGERY  13   shunt left and lazer eye surgery on right cataract and retenia tear with repair  . growth removal  2004   from thumb  . KNEE ARTHROSCOPY Right   . OOPHORECTOMY    . TOTAL HIP ARTHROPLASTY  06/24/2011   Procedure: TOTAL HIP ARTHROPLASTY;  Surgeon: FKerin Salen  [facility] [facility]  Service: Orthopedics;  Laterality: Right;  . TOTAL HIP ARTHROPLASTY Left 11/12/2012   Dr RMayer Camel . TOTAL HIP ARTHROPLASTY Left 11/12/2012   Procedure: TOTAL HIP ARTHROPLASTY;  Surgeon: FKerin Salen MD;  [facility] [facility]  Service: Orthopedics;  Laterality: Left;  [facility]   Social History  Social History  . Marital status: Married    Spouse name: N/A  . Number of children: N/A  . Years of education: N/A   Occupational History  . office administrator [employer]   Social History Main Topics  . Smoking status: Never Smoker  . Smokeless tobacco: Never Used  . Alcohol use No  . Drug use: No  . Sexual activity: Not Currently   Other Topics Concern  . None   Social History Narrative  . None     FAMILY HISTORY:  We obtained a detailed, 4-generation family history.  Significant diagnoses are listed below: Family History  Problem Relation Age of Onset  . Dementia Mother   . Cancer Mother     Breast and lung cancer  . Stroke Sister   . Breast cancer Sister 53  . Heart attack  Maternal Aunt   . Lung cancer Maternal Grandmother     non smoker  . Glaucoma Maternal Grandfather   . Anesthesia problems Neg Hx     The patient has one son who is cancer free.  Her sister was diagnosed with breast cancer at 29 and died.  The patient's mother was diagnosed with breast cancer at 62 and lung cancer at 71.  Her father died of a heart attack at 50.  His brother died in his teens and his parents "died young', so there is limited family history on the paternal side.  The patient's mother had a brother and two sisters.  One sister died in infancy, her other sister died of a heart attack at 30. The brother died in a house fire.  The patient's maternal grandmother died of lung cancer.  Patient's maternal ancestors are of Caucasian descent, and paternal ancestors are of Caucasian descent. There is no reported Ashkenazi Jewish ancestry. There is no known consanguinity.  GENETIC COUNSELING ASSESSMENT: BREUNA Obrien is a 70 y.o. female with a personal history of uterine and breast cancer and family history of breast cancer which is somewhat suggestive of a hereditary cancer syndrome and predisposition to cancer. We, therefore, discussed and recommended the following at today's visit.   DISCUSSION: We discussed that about 5-10% of breast cancer is hereditary with most cases due to BRCA mutations. We discussed that her early age of onset of uterine cancer is suggestive of a hereditary cancer syndrome like Lynch syndrome or PTEN.  Based on her family history, she does not have an early onset of breast cancer, and therefore a high risk mutation like BRCA is unlikely.  There could be a paternal risk based on the limited family history information.  We reviewed the characteristics, features and inheritance patterns of hereditary cancer syndromes. We also discussed genetic testing, including the appropriate family members to test, the process of testing, insurance coverage and turn-around-time for results.  We discussed the implications of a negative, positive and/or variant of uncertain significant result. We recommended [name] pursue genetic testing for the Breast/Ovarian cancer and endometrial cancer gene panel.   Based on [name]'s personal and family history of cancer, she meets medical criteria for genetic testing. Despite that she meets criteria, she may still have an out of pocket cost. We discussed that if her out of pocket cost for testing is over $100, the laboratory will call and confirm whether she wants to proceed with testing.  If the out of pocket cost of testing is less than $100 she will be billed by the genetic testing laboratory.   PLAN:  After considering the risks, benefits, and limitations, Ms. Ruggirello  provided informed consent to pursue genetic testing and the blood sample was sent to [facility] for analysis of the custom panel containing the genes for the Breast/Ovarian cancer and endometrial cancer panels. Results should be available within approximately 2-3 weeks' time, at which point they will be disclosed by telephone to Ms. Gagen, as will any additional recommendations warranted by these results. Ms. Balestrieri will receive a summary of her genetic counseling visit and a copy of her results once available. This information will also be available in Epic. We encouraged Ms. Estorga to remain in contact with cancer genetics annually so that we can continuously update the family history and inform her of any changes in cancer genetics and testing that may be of benefit for her family. Ms. Berrett questions were answered to her satisfaction today. Our contact information was provided should additional questions or concerns arise.  Lastly, we encouraged Ms. Mcnew to remain in contact with cancer genetics annually so that we can continuously update the family history and inform her of any changes in cancer genetics and testing that may be of benefit for this family.   Ms.  Vidrio  questions were answered to her satisfaction today. Our contact information was provided should additional questions or concerns arise. Thank you for the referral and allowing [place] to share in the care of your patient.   Khoi Hamberger P. Florene Glen, [place], [facility] Certified Genetic Counselor [email or web address] phone: [phone]  The patient was seen for a total of 55 minutes in face-to-face genetic counseling.  This patient was discussed with Drs. Magrinat, Lindi Adie and/or Burr Medico who agrees with the above.    _______________________________________________________________________ For Office Staff:  Number of people involved in session: 2 Was an Intern/ student involved with case: no

## 2016-02-03 NOTE — Progress Notes (Signed)
For the patient to understand and be given the tools to implement a healthy plant based diet during their cancer diagnosis.   Patient was seen today and found to be pleasant and accompanied by her husband. Pt labs: Glucose 200, GFR 71. Pts ht 64 in, wt 247 pounds, BMI 42.6. Pts medications: prednisolone, lovastatin, lisinopril. Pt and her husband were attentive and asked appropriate questions.   A folder of evidence based information with a focus on a plant based diet and general nutrition during cancer was given to the patient. Dietitian offered some education for diabetes management as well. The importance of legitimate, evidence based information was discussed and examples were given. Dietitian educated the patient on implementing a plant based diet by incorporating more plant proteins, fruits, and vegetables. As a part of a healthy routine physical activity was discussed. As a part of the continuum of care the cancer dietitian's contact information was given to the patient in the event they would like to have a follow up appointment.

## 2016-02-03 NOTE — Progress Notes (Signed)
Radiation Oncology         [phone] ________________________________  Name: [name] MRN: [id number]  Date: 02/03/2016  DOB: 1945-09-04  VW:[id number] Jenny Reichmann, MD  Rozetta Nunnery, *     REFERRING PHYSICIAN: Rozetta Nunnery, *   DIAGNOSIS: The encounter diagnosis was Breast cancer of upper-outer quadrant of right female breast ([place]).   HISTORY OF PRESENT ILLNESS: [name] is a 70 y.o. female seen for evaluation in the multidiscplinary breast clinic for a new diagnosis of multifocal breast cancer. She was found to have distortion on her screening mammogram and diagnostic imaging was performed as well as biopsy on 01/22/16. Findings in the right breast revealed a 2 x 1.5 x 1.6 cm mass at 12 o'clock, as well as a second mass in the same location measuring 8 x 7 x 8 mm, and a 8 mm lesion at 4 o'clock. The dominant mass and the 4 o'clock mass were biopsied and both showed ER/PR positive, HER 2 negative invasive ductal carcinoma. She comes today to meet with Dr. Lisbeth Renshaw to discuss the role of radiotherapy.   PREVIOUS RADIATION THERAPY: No   PAST MEDICAL HISTORY:  Past Medical History:  Diagnosis Date  . ANXIETY 01/04/2007  . DIABETES MELLITUS, TYPE II 01/04/2007   only takes actoplus daily  . Dizziness and giddiness 02/29/2008  . DVT, HX OF    at age 40 in right buttocks  . GERD 01/04/2007   pt reports resolved with weight loss  . GLAUCOMA 07/30/2008  . History of blood transfusion    no abnormal  reaction  . HYPERLIPIDEMIA 01/04/2007   taking Pravastatin daily  . HYPERTENSION 01/04/2007   takes Lisinopril daily  . Joint pain   . Joint swelling   . LEG PAIN, LEFT 07/06/2007  . NUMBNESS 07/30/2008   in fingers;pt states from Diamox  . OSTEOARTHRITIS, HIP 09/25/2009  . OTITIS MEDIA, ACUTE, BILATERAL 02/29/2008  . Overweight(278.02) 01/04/2007  . PONV (postoperative nausea and vomiting)   . SLEEP APNEA, OBSTRUCTIVE    doesn't use a cpap;study done about 58yr ago  .  TRANSIENT ISCHEMIC ATTACK, HX OF 01/04/2007  . Vision loss    left eye       PAST SURGICAL HISTORY: Past Surgical History:  Procedure Laterality Date  . ABDOMINAL HYSTERECTOMY  2000  . CHOLECYSTECTOMY    . EYE SURGERY  13   shunt left and lazer eye surgery on right cataract and retenia tear with repair  . growth removal  2004   from thumb  . KNEE ARTHROSCOPY Right   . OOPHORECTOMY    . TOTAL HIP ARTHROPLASTY  06/24/2011   Procedure: TOTAL HIP ARTHROPLASTY;  Surgeon: FKerin Salen  [facility]  Service: Orthopedics;  Laterality: Right;  . TOTAL HIP ARTHROPLASTY Left 11/12/2012   Dr RMayer Camel . TOTAL HIP ARTHROPLASTY Left 11/12/2012   Procedure: TOTAL HIP ARTHROPLASTY;  Surgeon: FKerin Salen MD;  [facility] [facility]  Service: Orthopedics;  Laterality: Left;  DEPUY PINNACLE     FAMILY HISTORY:  Family History  Problem Relation Age of Onset  . Dementia Mother   . Cancer Mother     Breast and lung cancer  . Stroke Sister   . Anesthesia problems Neg Hx      SOCIAL HISTORY:  reports that she has never smoked. She has never used smokeless tobacco. She reports that she does not drink alcohol or use drugs. She is married and resides in [place]  She is retired and spends much of her time with a young granddaughter.   ALLERGIES: Lipitor [atorvastatin calcium]; Oxycodone; Sitagliptin phosphate; and Sulfa drugs cross reactors   MEDICATIONS:  Current Outpatient Prescriptions  Medication Sig Dispense Refill  . acetaminophen (TYLENOL) 500 MG tablet Take 1,000 mg by mouth every 6 (six) hours as needed for pain. Reported on 06/12/2015    . amoxicillin (AMOXIL) 875 MG tablet Take 1 tablet (875 mg total) by mouth 2 (two) times daily. 20 tablet 0  . aspirin 81 MG tablet Take 81 mg by mouth daily. Reported on 06/12/2015    . benzonatate (TESSALON) 100 MG capsule Take 1-2 capsules (100-200 mg total) by mouth 3 (three) times daily as needed for cough. (Patient not taking: Reported on  04/29/2015) 40 capsule 0  . benzonatate (TESSALON) 100 MG capsule Take 2 capsules (200 mg total) by mouth 2 (two) times daily as needed. 30 capsule 1  . bimatoprost (LUMIGAN) 0.03 % ophthalmic solution Place 1 drop into both eyes at bedtime.    . Brimonidine Tartrate-Timolol (COMBIGAN OP) Place 1 drop into both eyes 2 (two) times daily.     . brimonidine-timolol (COMBIGAN) 0.2-0.5 % ophthalmic solution Administer 1 drop to both eyes every twelve (12) hours. for 25 days    . Bromfenac Sodium (PROLENSA OP) Apply to eye.    . dorzolamide (TRUSOPT) 2 % ophthalmic solution Place 1 drop into both eyes 2 (two) times daily.    Marland Kitchen latanoprost (XALATAN) 0.005 % ophthalmic solution Place 1 drop into both eyes at bedtime. Reported on 06/12/2015    . lisinopril-hydrochlorothiazide (PRINZIDE,ZESTORETIC) 20-12.5 MG tablet TAKE ONE TABLET BY MOUTH ONCE DAILY 90 tablet 0  . lovastatin (MEVACOR) 20 MG tablet TAKE ONE TABLET BY MOUTH AT BEDTIME 90 tablet 0  . lovastatin (MEVACOR) 40 MG tablet Take 1 tablet (40 mg total) by mouth at bedtime. 90 tablet 3  . pioglitazone-metformin (ACTOPLUS MET) 15-500 MG tablet Take 1 tablet by mouth 2 (two) times daily with a meal. 180 tablet 3  . prednisoLONE acetate (PRED FORTE) 1 % ophthalmic suspension 1 drop 4 (four) times daily.     No current facility-administered medications for this encounter.      REVIEW OF SYSTEMS: On review of systems, the patient reports that she is doing well overall. She denies any chest pain, shortness of breath, cough, fevers, chills, night sweats, unintended weight changes. She denies any bowel or bladder disturbances, and denies abdominal pain, nausea or vomiting. She denies any new musculoskeletal or joint aches or pains. A complete review of systems is obtained and is otherwise negative.     PHYSICAL EXAM:   Pain scale 0/10 In general this is a well appearing Caucasian female in no acute distress. She's alert and oriented x4 and appropriate  throughout the examination. Cardiopulmonary assessment is negative for acute distress and She exhibits normal effort. Breast exam is deferred.    ECOG = 0  0 - Asymptomatic (Fully active, able to carry on all predisease activities without restriction)  1 - Symptomatic but completely ambulatory (Restricted in physically strenuous activity but ambulatory and able to carry out work of a light or sedentary nature. For example, light housework, office work)  2 - Symptomatic, <50% in bed during the day (Ambulatory and capable of all self care but unable to carry out any work activities. Up and about more than 50% of waking hours)  3 - Symptomatic, >50% in bed, but not bedbound (Capable of only limited  self-care, confined to bed or chair 50% or more of waking hours)  4 - Bedbound (Completely disabled. Cannot carry on any self-care. Totally confined to bed or chair)  5 - Death   Eustace Pen MM, Creech RH, Tormey DC, et al. [phone]). "Toxicity and response criteria of the [facility]". [place] Oncol. 5 (6): 649-55    LABORATORY DATA:  Lab Results  Component Value Date   WBC 7.9 10/30/2014   HGB 12.7 10/30/2014   HCT 37.5 10/30/2014   MCV 80.3 10/30/2014   PLT 235.0 10/30/2014   Lab Results  Component Value Date   NA 141 04/29/2015   K 4.3 04/29/2015   CL 106 04/29/2015   CO2 27 04/29/2015   Lab Results  Component Value Date   ALT 15 04/29/2015   AST 12 04/29/2015   ALKPHOS 59 04/29/2015   BILITOT 0.4 04/29/2015      RADIOGRAPHY: Mm Digital Diagnostic Unilat R  Result Date: 01/22/2016 CLINICAL DATA:  Two biopsies were performed of the right breast today. One biopsy was performed of the 12 o'clock position approximately 10 cm from the nipple and a second biopsy was performed in the 4 o'clock position approximately 5 cm from the nipple. EXAM: DIAGNOSTIC RIGHT MAMMOGRAM POST ULTRASOUND BIOPSY X 2 Previous exam(s). FINDINGS: Mammographic images were obtained  following ultrasound guided biopsy of the suspicious mass in the 12 o'clock position of the right breast 10 cm from the nipple. A ribbon shaped biopsy clip is satisfactorily positioned within the anterior aspect of this mass. A second mass was biopsied in the lower inner quadrant of the right breast in the 4 o'clock position. The coil shaped biopsy clip is satisfactorily positioned within this mass. The 2 clips placed today are separated by greater than 10 cm on the 90 degree lateral view performed. IMPRESSION: Satisfactory position of ribbon and coil shaped biopsy clips in the right breast. Final Assessment: Post Procedure Mammograms for Marker Placement Electronically Signed   By: Curlene Dolphin M.D.   On: 01/22/2016 16:56   US Breast Ltd Uni Right Inc Axilla  Addendum Date: 01/22/2016   ADDENDUM REPORT: 01/22/2016 16:59 ADDENDUM: Targeted ultrasound at the time of today's biopsy revealed an additional mass in the right breast at the 4 o'clock axis, 5 cm from the nipple, measuring 0.9 x 0.8 x 0.6 cm, corresponding to the mammographic finding, highly suspicious for multicentric disease. Ultrasound-guided biopsy was performed of the dominant mass at the 12 o'clock axis and this additional mass in the right breast at the 4 o'clock axis. If pathology results are positive for malignancy, and breast conservation therapy is considered, recommend ultrasound-guided biopsy of the additional mass at the 12 o'clock axis, 4 cm from the nipple, which is highly suspicious for additional multifocal disease. Alternatively, consider breast MRI for further characterization of disease extent. Electronically Signed   By: Franki Cabot M.D.   On: 01/22/2016 16:59   Result Date: 01/22/2016 CLINICAL DATA:  Patient returns today to evaluate a possible right breast mass identified on recent screening mammogram. EXAM: 2D DIGITAL DIAGNOSTIC RIGHT MAMMOGRAM WITH CAD AND ADJUNCT TOMO ULTRASOUND RIGHT BREAST COMPARISON:  Previous exam(s).  ACR Breast Density Category c: The breast tissue is heterogeneously dense, which may obscure small masses. FINDINGS: On today's additional views with 3D tomosynthesis, a an irregular mass is confirmed within the upper right breast, 12 o'clock axis region, at middle to posterior depth, measuring approximately 2.3 cm greatest dimension. Additional smaller irregular masses are  seen within the upper right breast, at anterior and middle depth, 12-1 o'clock axis region, measuring approximately 0.7 mm and 0.9 mm respectively, only well seen on the right CC projection, tomosynthesis CC slice 57. Lastly, there is a possible additional smaller mass within the lower inner quadrant of the right breast, at middle depth, measuring approximately 0.8 cm greatest dimension, tomosynthesis CC slice 24 and MLO slice 45. Mammographic images were processed with CAD. Targeted ultrasound is performed, showing an irregular hypoechoic mass in the right breast at the 12 o'clock axis, 10 cm from the nipple, measuring 2 x 1.5 x 1.6 cm, with internal vascularity, corresponding to the dominant mass seen on mammogram. Additional smaller irregular hypoechoic masses are seen in the right breast (#1) at the 12 o'clock axis, 8 cm from the nipple, measuring 0.7 x 0.5 x 0.6 cm and (#2) at the 12 o'clock axis, 4 cm from the nipple, measuring 0.8 x 0.7 x 0.8 cm, corresponding to the smaller masses seen within the upper right breast on mammogram at anterior and middle depth. Right axilla was evaluated with ultrasound showing no enlarged or morphologically abnormal lymph nodes. IMPRESSION: 1. Dominant irregular mass in the right breast at the 12 o'clock axis, 10 cm from the nipple, measuring 2 x 1.5 x 1.6 cm, corresponding to the dominant mass seen on mammogram. This is a highly suspicious finding for which ultrasound-guided core biopsy is recommended. 2. 2 additional smaller irregular masses in the right breast at the 12 o'clock axis, extending towards the  nipple from the dominant mass, highly suggestive of additional multifocal disease. The mass which is most distant from the dominant mass is located at the 12 o'clock axis, 4 cm from the nipple, measuring 0.8 x 0.7 x 0.8 cm. Again, these are highly suggestive of additional multifocal disease. 3. After further review, there is an additional possible mass within the lower inner quadrant of the right breast, at middle depth, measuring approximately 0.8 cm greatest dimension, highly suspicious for multicentric disease. Additional ultrasound evaluation will be performed prior to the biopsy. If a sonographic correlate is identified, this will also be biopsied. RECOMMENDATION: 1. Ultrasound-guided core biopsy for the dominant right breast mass at the 12 o'clock axis, 10 cm from the nipple, measuring 2 x 1.5 x 1.6 cm. 2. Targeted ultrasound evaluation, with possible ultrasound-guided biopsy, for the additional mass in the lower inner quadrant of the right breast which is suspicious for multicentric disease. 3. If pathology results are positive for malignancy, and if breast conservation therapy is considered, additional biopsy may be needed for the irregular mass at the 12 o'clock axis, 4 cm from the nipple, which is highly suspicious for additional multifocal disease. Ultrasound-guided biopsies are scheduled for later today. I have discussed the findings and recommendations with the patient. Results were also provided in writing at the conclusion of the visit. If applicable, a reminder letter will be sent to the patient regarding the next appointment. BI-RADS CATEGORY  5: Highly suggestive of malignancy. Electronically Signed: By: Bary Richard M.D. On: 01/22/2016 16:54   Mm Diag Breast Tomo Uni Right  Addendum Date: 01/22/2016   ADDENDUM REPORT: 01/22/2016 16:59 ADDENDUM: Targeted ultrasound at the time of today's biopsy revealed an additional mass in the right breast at the 4 o'clock axis, 5 cm from the nipple,  measuring 0.9 x 0.8 x 0.6 cm, corresponding to the mammographic finding, highly suspicious for multicentric disease. Ultrasound-guided biopsy was performed of the dominant mass at the 12 o'clock axis  and this additional mass in the right breast at the 4 o'clock axis. If pathology results are positive for malignancy, and breast conservation therapy is considered, recommend ultrasound-guided biopsy of the additional mass at the 12 o'clock axis, 4 cm from the nipple, which is highly suspicious for additional multifocal disease. Alternatively, consider breast MRI for further characterization of disease extent. Electronically Signed   By: Franki Cabot M.D.   On: 01/22/2016 16:59   Result Date: 01/22/2016 CLINICAL DATA:  Patient returns today to evaluate a possible right breast mass identified on recent screening mammogram. EXAM: 2D DIGITAL DIAGNOSTIC RIGHT MAMMOGRAM WITH CAD AND ADJUNCT TOMO ULTRASOUND RIGHT BREAST COMPARISON:  Previous exam(s). ACR Breast Density Category c: The breast tissue is heterogeneously dense, which may obscure small masses. FINDINGS: On today's additional views with 3D tomosynthesis, a an irregular mass is confirmed within the upper right breast, 12 o'clock axis region, at middle to posterior depth, measuring approximately 2.3 cm greatest dimension. Additional smaller irregular masses are seen within the upper right breast, at anterior and middle depth, 12-1 o'clock axis region, measuring approximately 0.7 mm and 0.9 mm respectively, only well seen on the right CC projection, tomosynthesis CC slice 57. Lastly, there is a possible additional smaller mass within the lower inner quadrant of the right breast, at middle depth, measuring approximately 0.8 cm greatest dimension, tomosynthesis CC slice 24 and MLO slice 45. Mammographic images were processed with CAD. Targeted ultrasound is performed, showing an irregular hypoechoic mass in the right breast at the 12 o'clock axis, 10 cm from the  nipple, measuring 2 x 1.5 x 1.6 cm, with internal vascularity, corresponding to the dominant mass seen on mammogram. Additional smaller irregular hypoechoic masses are seen in the right breast (#1) at the 12 o'clock axis, 8 cm from the nipple, measuring 0.7 x 0.5 x 0.6 cm and (#2) at the 12 o'clock axis, 4 cm from the nipple, measuring 0.8 x 0.7 x 0.8 cm, corresponding to the smaller masses seen within the upper right breast on mammogram at anterior and middle depth. Right axilla was evaluated with ultrasound showing no enlarged or morphologically abnormal lymph nodes. IMPRESSION: 1. Dominant irregular mass in the right breast at the 12 o'clock axis, 10 cm from the nipple, measuring 2 x 1.5 x 1.6 cm, corresponding to the dominant mass seen on mammogram. This is a highly suspicious finding for which ultrasound-guided core biopsy is recommended. 2. 2 additional smaller irregular masses in the right breast at the 12 o'clock axis, extending towards the nipple from the dominant mass, highly suggestive of additional multifocal disease. The mass which is most distant from the dominant mass is located at the 12 o'clock axis, 4 cm from the nipple, measuring 0.8 x 0.7 x 0.8 cm. Again, these are highly suggestive of additional multifocal disease. 3. After further review, there is an additional possible mass within the lower inner quadrant of the right breast, at middle depth, measuring approximately 0.8 cm greatest dimension, highly suspicious for multicentric disease. Additional ultrasound evaluation will be performed prior to the biopsy. If a sonographic correlate is identified, this will also be biopsied. RECOMMENDATION: 1. Ultrasound-guided core biopsy for the dominant right breast mass at the 12 o'clock axis, 10 cm from the nipple, measuring 2 x 1.5 x 1.6 cm. 2. Targeted ultrasound evaluation, with possible ultrasound-guided biopsy, for the additional mass in the lower inner quadrant of the right breast which is  suspicious for multicentric disease. 3. If pathology results are positive for malignancy,  and if breast conservation therapy is considered, additional biopsy may be needed for the irregular mass at the 12 o'clock axis, 4 cm from the nipple, which is highly suspicious for additional multifocal disease. Ultrasound-guided biopsies are scheduled for later today. I have discussed the findings and recommendations with the patient. Results were also provided in writing at the conclusion of the visit. If applicable, a reminder letter will be sent to the patient regarding the next appointment. BI-RADS CATEGORY  5: Highly suggestive of malignancy. Electronically Signed: By: Bary Richard M.D. On: 01/22/2016 16:54   [place] Rt Breast Bx W Loc Dev 1st Lesion Img Bx Spec US Guide  Addendum Date: 02/01/2016   ADDENDUM REPORT: 01/25/2016 11:14 ADDENDUM: Pathology revealed grade I to II invasive ductal carcinoma in the right breast at 12:00 10 cm from the nipple and the right breast at 4:00. This was found to be concordant by Dr. Britta Mccreedy. Pathology results were discussed with the patient by telephone. The patient reported doing well after the biopsies with minimal bruising at the sites. Post biopsy instructions and care were reviewed and questions were answered. The patient was encouraged to call [facility] for any additional concerns. The patient was referred to the [facility] at the [facility] on February 03, 2016. There are additional right breast masses that could be biopsied if breast conservation is considered, specifically the right breast at 12:00 4 cm from the nipple. A bilateral breast MRI is recommended for extent of disease. Pathology results reported by Sonnie Alamo RN, BSN on 01/25/2016. Electronically Signed   By: Britta Mccreedy M.D.   On: 01/25/2016 11:14   Result Date: 02/01/2016 CLINICAL DATA:  Suspicious masses in the right breast.  This biopsy describes the biopsy largest of the masses; it is in the 12 o'clock position 8 cm from the nipple. This measured approximately 2.0 cm greatest diameter on the right breast ultrasound performed earlier today. EXAM: ULTRASOUND GUIDED RIGHT BREAST CORE NEEDLE BIOPSY COMPARISON:  Previous exam(s). FINDINGS: I met with the patient and we discussed the procedure of ultrasound-guided biopsy, including benefits and alternatives. We discussed the high likelihood of a successful procedure. We discussed the risks of the procedure, including infection, bleeding, tissue injury, clip migration, and inadequate sampling. Informed written consent was given. The usual time-out protocol was performed immediately prior to the procedure. Using sterile technique and 1% Lidocaine as local anesthetic, under direct ultrasound visualization, a 12 gauge spring-loaded device was used to perform biopsy of an approximately 2.0 cm mass in the 12 o'clock position of the right breast 10 cm from the nipple using a lateral to medial approach. At the conclusion of the procedure a ribbon shaped tissue marker clip was deployed into the biopsy cavity. Follow up 2 view mammogram was performed and dictated separately. IMPRESSION: Ultrasound guided biopsy of a right breast mass at 12 o'clock position 10 cm from the nipple. No apparent complications. Electronically Signed: By: Britta Mccreedy M.D. On: 01/22/2016 16:48   [place] Rt Breast Bx W Loc Dev Ea Add Lesion Img Bx Spec US Guide  Addendum Date: 02/01/2016   ADDENDUM REPORT: 01/25/2016 11:15 ADDENDUM: Pathology revealed grade I to II invasive ductal carcinoma in the right breast at 12:00 10 cm from the nipple and the right breast at 4:00. This was found to be concordant by Dr. Britta Mccreedy. Pathology results were discussed with the patient by telephone. The patient reported doing well after  the biopsies with minimal bruising at the sites. Post biopsy instructions and care were reviewed and  questions were answered. The patient was encouraged to call [facility] for any additional concerns. The patient was referred to the [facility] at the [facility] on February 03, 2016. There are additional right breast masses that could be biopsied if breast conservation is considered, specifically the right breast at 12:00 4 cm from the nipple. A bilateral breast MRI is recommended for extent of disease. Pathology results reported by Sonnie Alamo RN, BSN on 01/25/2016. Electronically Signed   By: Britta Mccreedy M.D.   On: 01/25/2016 11:15   Result Date: 02/01/2016 CLINICAL DATA:  The patient was evaluated earlier today with mammography and ultrasound for possible abnormality seen on screening mammogram. There are several suspicious areas in the right breast. This biopsy describes biopsy of the mass seen most distal to the dominant mass in the 12 o'clock position 10 cm from the nipple, which was biopsied and described on a separate report. Upon review of the mammogram, I noted that there is a new irregular mass in the lower inner quadrant of the right breast. This is evaluated on ultrasound prior to this biopsy, and described below. EXAM: ULTRASOUND GUIDED RIGHT BREAST CORE NEEDLE BIOPSY COMPARISON:  Previous exam(s). FINDINGS: Initially, ultrasound imaging of the lower inner quadrant of the right breast is performed. There is a hypoechoic solid rounded mass with microlobulated and indistinct margins at 4 o'clock position 5 cm from the nipple that measures 9 x 6 x 8 mm corresponds to the nodule seen in this region on mammogram. This mass is suspicious and will be biopsied, as described in the report below. I met with the patient and we discussed the procedure of ultrasound-guided biopsy, including benefits and alternatives. We discussed the high likelihood of a successful procedure. We discussed the risks of the procedure, including  infection, bleeding, tissue injury, clip migration, and inadequate sampling. Informed written consent was given. The usual time-out protocol was performed immediately prior to the procedure. Using sterile technique and 1% Lidocaine as local anesthetic, under direct ultrasound visualization, a 12 gauge spring-loaded device was used to perform biopsy of a 9 x 6 x 8 mm solid suspicious mass measuring 9 x 6 x 8 mm using a lateral to medial approach. At the conclusion of the procedure a coil shaped tissue marker clip was deployed into the biopsy cavity. Follow up 2 view mammogram was performed and dictated separately. IMPRESSION: Ultrasound guided biopsy of a mass in the lower inner quadrant of the right breast, at 4 o'clock position. No apparent complications. Electronically Signed: By: Britta Mccreedy M.D. On: 01/22/2016 16:54       IMPRESSION/PLAN: 1. Multifocal Stage IA T1c N0, Mx, ER/PR positive invasive ductal carcinoma of the right breast. Dr. Mitzi Hansen discusses the pathology findings and reviews the nature of invasive breast disease. The consensus from the breast conference included considering MRI if the patient was interested in breast conservation surgery. She is interested, and understands that there are additional testing modalities to pursue prior to lumpectomy which would include the MRI as above, as well as a biopsy of the 3rd lesion seen on her diagnostic imaging. Dr. Mitzi Hansen discusses that after this completion of her work up, if  breast conservation with lumpectomy and sentinel evaluation is offered, that there would be a role for radiotherapy. She is going to meet with genetic counseling as well and  her tumor will be tested for oncotype. Provided that chemotherapy is not indicated, the patient's course would then be followed by external radiotherapy to the breast followed by antiestrogen therapy. We discussed the risks, benefits, short, and long term effects of radiotherapy, and the patient is  interested in proceeding. Dr. Lisbeth Renshaw discusses the delivery and logistics of radiotherapy. Dr. Lisbeth Renshaw anticipates a hypopfractionated course if she was to proceed with a total of 20 fractions. We will see her back about 2 weeks after surgery to move forward with the simulation and planning process and anticipate starting radiotherapy about 4 weeks after surgery.    The above documentation reflects my direct findings during this shared patient visit. Please see the separate note by Dr. Lisbeth Renshaw on this date for the remainder of the patient's plan of care.    Carola Rhine, PAC

## 2016-02-03 NOTE — Progress Notes (Signed)
[name]  [phone] [phone]     ID: [name] DOB: 1945-06-21  MR#: [id number]  JQB#:[id number]  Patient Care Team: Biagio Borg, MD as PCP - General Alphonsa Overall, MD as Consulting Physician (General Surgery) Chauncey Cruel, MD as Consulting Physician (Oncology) Kyung Rudd, MD as Consulting Physician (Radiation Oncology) Bobbye Charleston, MD as Consulting Physician (Obstetrics and Gynecology) Nada Libman, MD as Referring Physician (Specialist) Vevelyn Royals, MD as Consulting Physician (Ophthalmology) OTHER MD:  CHIEF COMPLAINT: Estrogen receptor positive breast cancer  CURRENT TREATMENT: Awaiting definitive surgery   BREAST CANCER HISTORY: [name] had screening mammography showing some suspicious calcifications in the right breast leading to right diagnostic mammography with ultrasonography 01/22/2016 at [facility]. The breast density was category C. In the upper right breast there was a 2.3 cm mass with additional masses measuring 0.9 and 0.7 cm. There was also a possible additional 0.8 mass in the lower inner quadrant. Ultrasound confirmed an irregular hypoechoic mass in the right breast upper outer quadrant measuring 2.0 cm. There were other masses measuring 0.7 and 0.8 cm by ultrasonography. The right axilla was sonographically benign.  Biopsy of a 12:00 and 4:00 mass in the right breast 01/22/2016 showed ([id number]) both specimens showing invasive ductal carcinoma, grade 1 or 2, both 95% estrogen receptor positive, both 95% progesterone receptor positive, both with strong staining intensity, with MIB-1 ranging from 10-15%, and both HER-2 negative, the signals ratio being 1.23-1.42, and the number per cell 1.85-2.59.  Her subsequent history is as detailed below INTERVAL HISTORY: [name] was evaluated in the multidisciplinary breast cancer clinic 02/03/2016 accompanied by her husband [name]. Her case was also presented in the  multidisciplinary breast cancer conference that same morning. At that time a preliminary plan was proposed: Breast MRI to help decide whether to lumpectomy or a mastectomy was performed, Oncotype, adjuvant radiation and adjuvant anti-estrogens  REVIEW OF SYSTEMS: There were no specific symptoms leading to the original mammogram, which was routinely scheduled. [name] does have multiple chronic issues including insomnia, fatigue, pain in her legs and feet, difficulty walking status post bilateral hip replacements, problems with vision currently being treated with prednisone forte, deconditioning, dry cough, feeling cold, stress urinary incontinence, arthritis, diabetes, asthma, and obesity. A detailed review of systems was otherwise stable.  PAST MEDICAL HISTORY: Past Medical History:  Diagnosis Date  . ANXIETY 01/04/2007  . DIABETES MELLITUS, TYPE II 01/04/2007   only takes actoplus daily  . Dizziness and giddiness 02/29/2008  . DVT, HX OF    at age 23 in right buttocks  . GERD 01/04/2007   pt reports resolved with weight loss  . GLAUCOMA 07/30/2008  . History of blood transfusion    no abnormal  reaction  . HYPERLIPIDEMIA 01/04/2007   taking Pravastatin daily  . HYPERTENSION 01/04/2007   takes Lisinopril daily  . Joint pain   . Joint swelling   . LEG PAIN, LEFT 07/06/2007  . NUMBNESS 07/30/2008   in fingers;pt states from Diamox  . OSTEOARTHRITIS, HIP 09/25/2009  . OTITIS MEDIA, ACUTE, BILATERAL 02/29/2008  . Overweight(278.02) 01/04/2007  . PONV (postoperative nausea and vomiting)   . SLEEP APNEA, OBSTRUCTIVE    doesn't use a cpap;study done about 17yr ago  . TRANSIENT ISCHEMIC ATTACK, HX OF 01/04/2007  . Vision loss    left eye    PAST SURGICAL HISTORY: Past Surgical History:  Procedure Laterality Date  . ABDOMINAL HYSTERECTOMY  2000  . CHOLECYSTECTOMY    .  EYE SURGERY  13   shunt left and lazer eye surgery on right cataract and retenia tear with repair  . growth removal  2004    from thumb  . KNEE ARTHROSCOPY Right   . OOPHORECTOMY    . TOTAL HIP ARTHROPLASTY  06/24/2011   Procedure: TOTAL HIP ARTHROPLASTY;  Surgeon: Kerin Salen;  Location: [facility];  Service: Orthopedics;  Laterality: Right;  . TOTAL HIP ARTHROPLASTY Left 11/12/2012   Dr Mayer Camel  . TOTAL HIP ARTHROPLASTY Left 11/12/2012   Procedure: TOTAL HIP ARTHROPLASTY;  Surgeon: Kerin Salen, MD;  Location: [facility];  Service: Orthopedics;  Laterality: Left;  DEPUY PINNACLE    FAMILY HISTORY Family History  Problem Relation Age of Onset  . Dementia Mother   . Cancer Mother     Breast and lung cancer  . Stroke Sister   . Breast cancer Sister   . Anesthesia problems Neg Hx   The patient's father died at age 67, the patient's mother died at age 47. She had breast and lung cancers diagnosed shortly before her death. The patient had no brothers, 2 sisters. One sister was diagnosed with breast cancer at the age of 69.  GYNECOLOGIC HISTORY:  No LMP recorded. Patient has had a hysterectomy. Menarche age 64, first live birth age 30, the patient is GX P1. She had a hysterectomy for endometrial cancer in the year 2000. She did not take hormone replacement. She did use oral contraceptives for your than 20 years remotely, with no complications.  SOCIAL HISTORY:  [name] is retired. She is home with her husband [name]. Their son [name] also lives in [place], currently between jobs.    ADVANCED DIRECTIVES: In place  HEALTH MAINTENANCE: Social History  Substance Use Topics  . Smoking status: Never Smoker  . Smokeless tobacco: Never Used  . Alcohol use No     Colonoscopy: Never  PAP: Status post hysterectomy  Bone density: Remote   Allergies  Allergen Reactions  . Lipitor [Atorvastatin Calcium]     Leg cramp  . Oxycodone Nausea And Vomiting    Patient vomited for 3 days after taking  . Sitagliptin Phosphate Nausea And Vomiting  . Sulfa Drugs Cross Reactors Nausea And Vomiting    Current Outpatient  Prescriptions  Medication Sig Dispense Refill  . aspirin 81 MG tablet Take 81 mg by mouth daily. Reported on 06/12/2015    . bimatoprost (LUMIGAN) 0.03 % ophthalmic solution Place 1 drop into both eyes at bedtime.    . Brimonidine Tartrate-Timolol (COMBIGAN OP) Place 1 drop into both eyes 2 (two) times daily.     Marland Kitchen lisinopril-hydrochlorothiazide (PRINZIDE,ZESTORETIC) 20-12.5 MG tablet TAKE ONE TABLET BY MOUTH EVERY DAY    . lovastatin (MEVACOR) 40 MG tablet Take 1 tablet (40 mg total) by mouth at bedtime. 90 tablet 3  . pioglitazone-metformin (ACTOPLUS MET) 15-500 MG tablet Take by mouth.    . prednisoLONE acetate (PRED FORTE) 1 % ophthalmic suspension 1 drop 4 (four) times daily.    . pioglitazone-metformin (ACTOPLUS MET) 15-500 MG tablet TAKE ONE TABLET BY MOUTH ONCE DAILY 90 tablet 2   No current facility-administered medications for this visit.     OBJECTIVE: Middle-aged white woman who appears stated age 81:   02/03/16 0913  BP: (!) 146/63  Pulse: (!) 57  Resp: 18  Temp: 97.7 F (36.5 C)     Body mass index is 42.52 kg/m.    ECOG FS:2 - Symptomatic, <50% confined to bed  Ocular:  Sclerae unicteric, EOMs intact Ear-nose-throat: Oropharynx clear and moist Lymphatic: No cervical or supraclavicular adenopathy Lungs no rales or rhonchi, good excursion bilaterally Heart regular rate and rhythm, no murmur appreciated Abd soft, obese, nontender, positive bowel sounds MSK no focal spinal tenderness, no joint edema Neuro: non-focal, well-oriented, appropriate affect Breasts: The right breast is status post recent biopsy. There is a moderate ecchymosis. There are no skin or nipple changes of concern. The right axilla is benign. The left breast is unremarkable   LAB RESULTS:  CMP     Component Value Date/Time   NA 141 02/03/2016 0813   K 4.0 02/03/2016 0813   CL 106 04/29/2015 1730   CO2 26 02/03/2016 0813   GLUCOSE 200 (H) 02/03/2016 0813   BUN 10.2 02/03/2016 0813    CREATININE 0.8 02/03/2016 0813   CALCIUM 9.1 02/03/2016 0813   PROT 7.1 02/03/2016 0813   ALBUMIN 3.6 02/03/2016 0813   AST 12 02/03/2016 0813   ALT 13 02/03/2016 0813   ALKPHOS 54 02/03/2016 0813   BILITOT 0.48 02/03/2016 0813   GFRNONAA >90 11/13/2012 0430   GFRAA >90 11/13/2012 0430    INo results found for: SPEP, UPEP  Lab Results  Component Value Date   WBC 7.9 02/03/2016   NEUTROABS 5.1 02/03/2016   HGB 12.0 02/03/2016   HCT 36.0 02/03/2016   MCV 82.6 02/03/2016   PLT 187 02/03/2016      Chemistry      Component Value Date/Time   NA 141 02/03/2016 0813   K 4.0 02/03/2016 0813   CL 106 04/29/2015 1730   CO2 26 02/03/2016 0813   BUN 10.2 02/03/2016 0813   CREATININE 0.8 02/03/2016 0813      Component Value Date/Time   CALCIUM 9.1 02/03/2016 0813   ALKPHOS 54 02/03/2016 0813   AST 12 02/03/2016 0813   ALT 13 02/03/2016 0813   BILITOT 0.48 02/03/2016 0813       No results found for: LABCA2  No components found for: LABCA125  No results for input(s): INR in the last 168 hours.  Urinalysis    Component Value Date/Time   COLORURINE YELLOW 10/30/2014 1513   APPEARANCEUR CLEAR 10/30/2014 1513   LABSPEC 1.010 10/30/2014 1513   PHURINE 7.0 10/30/2014 1513   GLUCOSEU NEGATIVE 10/30/2014 1513   HGBUR NEGATIVE 10/30/2014 1513   BILIRUBINUR NEGATIVE 10/30/2014 1513   KETONESUR NEGATIVE 10/30/2014 1513   PROTEINUR NEGATIVE 11/08/2012 1319   UROBILINOGEN 0.2 10/30/2014 1513   NITRITE NEGATIVE 10/30/2014 1513   LEUKOCYTESUR NEGATIVE 10/30/2014 1513     STUDIES: Mm Digital Diagnostic Unilat R  Result Date: 01/22/2016 CLINICAL DATA:  Two biopsies were performed of the right breast today. One biopsy was performed of the 12 o'clock position approximately 10 cm from the nipple and a second biopsy was performed in the 4 o'clock position approximately 5 cm from the nipple. EXAM: DIAGNOSTIC RIGHT MAMMOGRAM POST ULTRASOUND BIOPSY X 2 Previous exam(s). FINDINGS:  Mammographic images were obtained following ultrasound guided biopsy of the suspicious mass in the 12 o'clock position of the right breast 10 cm from the nipple. A ribbon shaped biopsy clip is satisfactorily positioned within the anterior aspect of this mass. A second mass was biopsied in the lower inner quadrant of the right breast in the 4 o'clock position. The coil shaped biopsy clip is satisfactorily positioned within this mass. The 2 clips placed today are separated by greater than 10 cm on the 90 degree lateral view performed. IMPRESSION: Satisfactory  position of ribbon and coil shaped biopsy clips in the right breast. Final Assessment: Post Procedure Mammograms for Marker Placement Electronically Signed   By: Curlene Dolphin M.D.   On: 01/22/2016 16:56   US Breast Ltd Uni Right Inc Axilla  Addendum Date: 01/22/2016   ADDENDUM REPORT: 01/22/2016 16:59 ADDENDUM: Targeted ultrasound at the time of today's biopsy revealed an additional mass in the right breast at the 4 o'clock axis, 5 cm from the nipple, measuring 0.9 x 0.8 x 0.6 cm, corresponding to the mammographic finding, highly suspicious for multicentric disease. Ultrasound-guided biopsy was performed of the dominant mass at the 12 o'clock axis and this additional mass in the right breast at the 4 o'clock axis. If pathology results are positive for malignancy, and breast conservation therapy is considered, recommend ultrasound-guided biopsy of the additional mass at the 12 o'clock axis, 4 cm from the nipple, which is highly suspicious for additional multifocal disease. Alternatively, consider breast MRI for further characterization of disease extent. Electronically Signed   By: Franki Cabot M.D.   On: 01/22/2016 16:59   Result Date: 01/22/2016 CLINICAL DATA:  Patient returns today to evaluate a possible right breast mass identified on recent screening mammogram. EXAM: 2D DIGITAL DIAGNOSTIC RIGHT MAMMOGRAM WITH CAD AND ADJUNCT TOMO ULTRASOUND RIGHT  BREAST COMPARISON:  Previous exam(s). ACR Breast Density Category c: The breast tissue is heterogeneously dense, which may obscure small masses. FINDINGS: On today's additional views with 3D tomosynthesis, a an irregular mass is confirmed within the upper right breast, 12 o'clock axis region, at middle to posterior depth, measuring approximately 2.3 cm greatest dimension. Additional smaller irregular masses are seen within the upper right breast, at anterior and middle depth, 12-1 o'clock axis region, measuring approximately 0.7 mm and 0.9 mm respectively, only well seen on the right CC projection, tomosynthesis CC slice 57. Lastly, there is a possible additional smaller mass within the lower inner quadrant of the right breast, at middle depth, measuring approximately 0.8 cm greatest dimension, tomosynthesis CC slice 24 and MLO slice 45. Mammographic images were processed with CAD. Targeted ultrasound is performed, showing an irregular hypoechoic mass in the right breast at the 12 o'clock axis, 10 cm from the nipple, measuring 2 x 1.5 x 1.6 cm, with internal vascularity, corresponding to the dominant mass seen on mammogram. Additional smaller irregular hypoechoic masses are seen in the right breast (#1) at the 12 o'clock axis, 8 cm from the nipple, measuring 0.7 x 0.5 x 0.6 cm and (#2) at the 12 o'clock axis, 4 cm from the nipple, measuring 0.8 x 0.7 x 0.8 cm, corresponding to the smaller masses seen within the upper right breast on mammogram at anterior and middle depth. Right axilla was evaluated with ultrasound showing no enlarged or morphologically abnormal lymph nodes. IMPRESSION: 1. Dominant irregular mass in the right breast at the 12 o'clock axis, 10 cm from the nipple, measuring 2 x 1.5 x 1.6 cm, corresponding to the dominant mass seen on mammogram. This is a highly suspicious finding for which ultrasound-guided core biopsy is recommended. 2. 2 additional smaller irregular masses in the right breast at the  12 o'clock axis, extending towards the nipple from the dominant mass, highly suggestive of additional multifocal disease. The mass which is most distant from the dominant mass is located at the 12 o'clock axis, 4 cm from the nipple, measuring 0.8 x 0.7 x 0.8 cm. Again, these are highly suggestive of additional multifocal disease. 3. After further review, there is an  additional possible mass within the lower inner quadrant of the right breast, at middle depth, measuring approximately 0.8 cm greatest dimension, highly suspicious for multicentric disease. Additional ultrasound evaluation will be performed prior to the biopsy. If a sonographic correlate is identified, this will also be biopsied. RECOMMENDATION: 1. Ultrasound-guided core biopsy for the dominant right breast mass at the 12 o'clock axis, 10 cm from the nipple, measuring 2 x 1.5 x 1.6 cm. 2. Targeted ultrasound evaluation, with possible ultrasound-guided biopsy, for the additional mass in the lower inner quadrant of the right breast which is suspicious for multicentric disease. 3. If pathology results are positive for malignancy, and if breast conservation therapy is considered, additional biopsy may be needed for the irregular mass at the 12 o'clock axis, 4 cm from the nipple, which is highly suspicious for additional multifocal disease. Ultrasound-guided biopsies are scheduled for later today. I have discussed the findings and recommendations with the patient. Results were also provided in writing at the conclusion of the visit. If applicable, a reminder letter will be sent to the patient regarding the next appointment. BI-RADS CATEGORY  5: Highly suggestive of malignancy. Electronically Signed: By: Franki Cabot M.D. On: 01/22/2016 16:54   Mm Diag Breast Tomo Uni Right  Addendum Date: 01/22/2016   ADDENDUM REPORT: 01/22/2016 16:59 ADDENDUM: Targeted ultrasound at the time of today's biopsy revealed an additional mass in the right breast at the 4  o'clock axis, 5 cm from the nipple, measuring 0.9 x 0.8 x 0.6 cm, corresponding to the mammographic finding, highly suspicious for multicentric disease. Ultrasound-guided biopsy was performed of the dominant mass at the 12 o'clock axis and this additional mass in the right breast at the 4 o'clock axis. If pathology results are positive for malignancy, and breast conservation therapy is considered, recommend ultrasound-guided biopsy of the additional mass at the 12 o'clock axis, 4 cm from the nipple, which is highly suspicious for additional multifocal disease. Alternatively, consider breast MRI for further characterization of disease extent. Electronically Signed   By: Franki Cabot M.D.   On: 01/22/2016 16:59   Result Date: 01/22/2016 CLINICAL DATA:  Patient returns today to evaluate a possible right breast mass identified on recent screening mammogram. EXAM: 2D DIGITAL DIAGNOSTIC RIGHT MAMMOGRAM WITH CAD AND ADJUNCT TOMO ULTRASOUND RIGHT BREAST COMPARISON:  Previous exam(s). ACR Breast Density Category c: The breast tissue is heterogeneously dense, which may obscure small masses. FINDINGS: On today's additional views with 3D tomosynthesis, a an irregular mass is confirmed within the upper right breast, 12 o'clock axis region, at middle to posterior depth, measuring approximately 2.3 cm greatest dimension. Additional smaller irregular masses are seen within the upper right breast, at anterior and middle depth, 12-1 o'clock axis region, measuring approximately 0.7 mm and 0.9 mm respectively, only well seen on the right CC projection, tomosynthesis CC slice 57. Lastly, there is a possible additional smaller mass within the lower inner quadrant of the right breast, at middle depth, measuring approximately 0.8 cm greatest dimension, tomosynthesis CC slice 24 and MLO slice 45. Mammographic images were processed with CAD. Targeted ultrasound is performed, showing an irregular hypoechoic mass in the right breast at the  12 o'clock axis, 10 cm from the nipple, measuring 2 x 1.5 x 1.6 cm, with internal vascularity, corresponding to the dominant mass seen on mammogram. Additional smaller irregular hypoechoic masses are seen in the right breast (#1) at the 12 o'clock axis, 8 cm from the nipple, measuring 0.7 x 0.5 x 0.6 cm and (#  2) at the 12 o'clock axis, 4 cm from the nipple, measuring 0.8 x 0.7 x 0.8 cm, corresponding to the smaller masses seen within the upper right breast on mammogram at anterior and middle depth. Right axilla was evaluated with ultrasound showing no enlarged or morphologically abnormal lymph nodes. IMPRESSION: 1. Dominant irregular mass in the right breast at the 12 o'clock axis, 10 cm from the nipple, measuring 2 x 1.5 x 1.6 cm, corresponding to the dominant mass seen on mammogram. This is a highly suspicious finding for which ultrasound-guided core biopsy is recommended. 2. 2 additional smaller irregular masses in the right breast at the 12 o'clock axis, extending towards the nipple from the dominant mass, highly suggestive of additional multifocal disease. The mass which is most distant from the dominant mass is located at the 12 o'clock axis, 4 cm from the nipple, measuring 0.8 x 0.7 x 0.8 cm. Again, these are highly suggestive of additional multifocal disease. 3. After further review, there is an additional possible mass within the lower inner quadrant of the right breast, at middle depth, measuring approximately 0.8 cm greatest dimension, highly suspicious for multicentric disease. Additional ultrasound evaluation will be performed prior to the biopsy. If a sonographic correlate is identified, this will also be biopsied. RECOMMENDATION: 1. Ultrasound-guided core biopsy for the dominant right breast mass at the 12 o'clock axis, 10 cm from the nipple, measuring 2 x 1.5 x 1.6 cm. 2. Targeted ultrasound evaluation, with possible ultrasound-guided biopsy, for the additional mass in the lower inner quadrant of the  right breast which is suspicious for multicentric disease. 3. If pathology results are positive for malignancy, and if breast conservation therapy is considered, additional biopsy may be needed for the irregular mass at the 12 o'clock axis, 4 cm from the nipple, which is highly suspicious for additional multifocal disease. Ultrasound-guided biopsies are scheduled for later today. I have discussed the findings and recommendations with the patient. Results were also provided in writing at the conclusion of the visit. If applicable, a reminder letter will be sent to the patient regarding the next appointment. BI-RADS CATEGORY  5: Highly suggestive of malignancy. Electronically Signed: By: Franki Cabot M.D. On: 01/22/2016 16:54   [place] Rt Breast Bx W Loc Dev 1st Lesion Img Bx Spec US Guide  Addendum Date: 02/01/2016   ADDENDUM REPORT: 01/25/2016 11:14 ADDENDUM: Pathology revealed grade I to II invasive ductal carcinoma in the right breast at 12:00 10 cm from the nipple and the right breast at 4:00. This was found to be concordant by Dr. Curlene Dolphin. Pathology results were discussed with the patient by telephone. The patient reported doing well after the biopsies with minimal bruising at the sites. Post biopsy instructions and care were reviewed and questions were answered. The patient was encouraged to call [facility] for any additional concerns. The patient was referred to the [facility] at the [facility] on February 03, 2016. There are additional right breast masses that could be biopsied if breast conservation is considered, specifically the right breast at 12:00 4 cm from the nipple. A bilateral breast MRI is recommended for extent of disease. Pathology results reported by Susa Raring RN, BSN on 01/25/2016. Electronically Signed   By: Curlene Dolphin M.D.   On: 01/25/2016 11:14   Result Date: 02/01/2016 CLINICAL DATA:  Suspicious masses  in the right breast. This biopsy describes the biopsy largest of the masses; it is in the  12 o'clock position 8 cm from the nipple. This measured approximately 2.0 cm greatest diameter on the right breast ultrasound performed earlier today. EXAM: ULTRASOUND GUIDED RIGHT BREAST CORE NEEDLE BIOPSY COMPARISON:  Previous exam(s). FINDINGS: I met with the patient and we discussed the procedure of ultrasound-guided biopsy, including benefits and alternatives. We discussed the high likelihood of a successful procedure. We discussed the risks of the procedure, including infection, bleeding, tissue injury, clip migration, and inadequate sampling. Informed written consent was given. The usual time-out protocol was performed immediately prior to the procedure. Using sterile technique and 1% Lidocaine as local anesthetic, under direct ultrasound visualization, a 12 gauge spring-loaded device was used to perform biopsy of an approximately 2.0 cm mass in the 12 o'clock position of the right breast 10 cm from the nipple using a lateral to medial approach. At the conclusion of the procedure a ribbon shaped tissue marker clip was deployed into the biopsy cavity. Follow up 2 view mammogram was performed and dictated separately. IMPRESSION: Ultrasound guided biopsy of a right breast mass at 12 o'clock position 10 cm from the nipple. No apparent complications. Electronically Signed: By: Curlene Dolphin M.D. On: 01/22/2016 16:48   [place] Rt Breast Bx W Loc Dev Ea Add Lesion Img Bx Spec US Guide  Addendum Date: 02/01/2016   ADDENDUM REPORT: 01/25/2016 11:15 ADDENDUM: Pathology revealed grade I to II invasive ductal carcinoma in the right breast at 12:00 10 cm from the nipple and the right breast at 4:00. This was found to be concordant by Dr. Curlene Dolphin. Pathology results were discussed with the patient by telephone. The patient reported doing well after the biopsies with minimal bruising at the sites. Post biopsy instructions and care  were reviewed and questions were answered. The patient was encouraged to call [facility] for any additional concerns. The patient was referred to the [facility] at the [facility] on February 03, 2016. There are additional right breast masses that could be biopsied if breast conservation is considered, specifically the right breast at 12:00 4 cm from the nipple. A bilateral breast MRI is recommended for extent of disease. Pathology results reported by Susa Raring RN, BSN on 01/25/2016. Electronically Signed   By: Curlene Dolphin M.D.   On: 01/25/2016 11:15   Result Date: 02/01/2016 CLINICAL DATA:  The patient was evaluated earlier today with mammography and ultrasound for possible abnormality seen on screening mammogram. There are several suspicious areas in the right breast. This biopsy describes biopsy of the mass seen most distal to the dominant mass in the 12 o'clock position 10 cm from the nipple, which was biopsied and described on a separate report. Upon review of the mammogram, I noted that there is a new irregular mass in the lower inner quadrant of the right breast. This is evaluated on ultrasound prior to this biopsy, and described below. EXAM: ULTRASOUND GUIDED RIGHT BREAST CORE NEEDLE BIOPSY COMPARISON:  Previous exam(s). FINDINGS: Initially, ultrasound imaging of the lower inner quadrant of the right breast is performed. There is a hypoechoic solid rounded mass with microlobulated and indistinct margins at 4 o'clock position 5 cm from the nipple that measures 9 x 6 x 8 mm corresponds to the nodule seen in this region on mammogram. This mass is suspicious and will be biopsied, as described in the report below. I met with the patient and we discussed the procedure of ultrasound-guided biopsy, including benefits and alternatives. We discussed  the high likelihood of a successful procedure. We discussed the risks of the procedure,  including infection, bleeding, tissue injury, clip migration, and inadequate sampling. Informed written consent was given. The usual time-out protocol was performed immediately prior to the procedure. Using sterile technique and 1% Lidocaine as local anesthetic, under direct ultrasound visualization, a 12 gauge spring-loaded device was used to perform biopsy of a 9 x 6 x 8 mm solid suspicious mass measuring 9 x 6 x 8 mm using a lateral to medial approach. At the conclusion of the procedure a coil shaped tissue marker clip was deployed into the biopsy cavity. Follow up 2 view mammogram was performed and dictated separately. IMPRESSION: Ultrasound guided biopsy of a mass in the lower inner quadrant of the right breast, at 4 o'clock position. No apparent complications. Electronically Signed: By: Curlene Dolphin M.D. On: 01/22/2016 16:54    ELIGIBLE FOR AVAILABLE RESEARCH PROTOCOL: PALLAS  ASSESSMENT: 70 y.o. [place] woman with a remote history of early stage endometrial cancer, now status post right breast upper outer quadrant biopsy 01/22/2016 for a clinically multifocal T2 N0, stage 2A invasive ductal carcinoma, grade 1, estrogen and progesterone receptor positive, HER-2 negative, with an MIB-1 between 10 and 15%.  (1) definitive surgery pending  (2) Oncotype DX to be submitted from the final pathology sample  (3) adjuvant radiation to follow as appropriate  (4) anti-estrogens to follow at the completion of local treatment  PLAN: We spent the better part of today's hour-long appointment discussing the biology of breast cancer in general, and the specifics of the patient's tumor in particular. We first reviewed the fact that cancer is not one disease but more than 100 different diseases and that it is important to keep them separate-- otherwise when friends and relatives discuss their own cancer experiences with [place] confusion can result. Similarly we explained that if breast cancer spreads to  the bone or liver, the patient would not have bone cancer or liver cancer, but breast cancer in the bone and breast cancer in the liver: one cancer in three places-- not 3 different cancers which otherwise would have to be treated in 3 different ways.  We discussed the difference between local and systemic therapy. In terms of loco-regional treatment, lumpectomy plus radiation is equivalent to mastectomy as far as survival is concerned. For this reason, and because the cosmetic results are generally superior, we generally recommend breast conserving surgery. However, when dealing with multicentric disease, we will obtain an MRI to help guide the surgical decision. While separate lumpectomies in the central breast are possible, mastectomy may be preferable. This decision is pending at this time.  We then discussed the rationale for systemic therapy. There is some risk that this cancer may have already spread to other parts of her body. Patients frequently ask at this point about bone scans, CAT scans and PET scans to find out if they have occult breast cancer somewhere else. The problem is that in early stage disease we are much more likely to find false positives then true cancers and this would expose the patient to unnecessary procedures as well as unnecessary radiation. Scans cannot answer the question the patient really would like to know, which is whether she has microscopic disease elsewhere in her body. For those reasons we do not recommend them.  Of course we would proceed to aggressive evaluation of any symptoms that might suggest metastatic disease, but that is not the case here.  Next we went over the  options for systemic therapy which are anti-estrogens, anti-HER-2 immunotherapy, and chemotherapy. Sri does not meet criteria for anti-HER-2 immunotherapy. She is a good candidate for anti-estrogens.  The question of chemotherapy is more complicated. Chemotherapy is most effective in rapidly  growing, aggressive tumors. It is much less effective in low-grade, slow growing cancers, like Jerita 's. For that reason we are going to request an Oncotype from the definitive surgical sample, as suggested by NCCN guidelines. That will help [place] make a definitive decision regarding chemotherapy in this case.  In short the plan will be to clarify the surgical decision with an MRI, proceed to definitive surgery, obtain an Oncotype from that sample, and most likely, since I anticipate the result will be "low-risk", proceed to radiation. Vivica will then return to see me at the completion of her local treatment. We will then start anti-estrogens, most likely anastrozole  If her cancer ends up being stage II, we will consider the PALLAS study.  Burn[place]ette has a good understanding of the overall plan. She agrees with it. She knows the goal of treatment in her case is cure. She will call with any problems that may develop before her next visit here.  Chauncey Cruel, MD   02/03/2016 11:13 AM Medical Oncology and Hematology [facility] [address] [place], [place] [address] Tel. [phone]    Fax. [phone]

## 2016-02-03 NOTE — Patient Instructions (Signed)

## 2016-02-03 NOTE — Telephone Encounter (Signed)
Appt made per LOS

## 2016-02-03 NOTE — Therapy (Signed)
[place], [place], [address] Phone: [phone]   Fax:  [phone]  Physical Therapy Evaluation  Patient Details  Name: [name] MRN: [id number] Date of Birth: Dec 28, 1945 Referring Provider: Dr. Alphonsa Overall  Encounter Date: 02/03/2016      PT End of Session - 02/03/16 1137    Visit Number 1   Number of Visits 1   PT Start Time 1005   PT Stop Time 3016  Also saw patient from 1033-1050 and 1155-1210 for a total of 42 minutes   PT Time Calculation (min) 10 min   Activity Tolerance Patient tolerated treatment well   Behavior During Therapy [facility] for tasks assessed/performed      Past Medical History:  Diagnosis Date  . ANXIETY 01/04/2007  . DIABETES MELLITUS, TYPE II 01/04/2007   only takes actoplus daily  . Dizziness and giddiness 02/29/2008  . DVT, HX OF    at age 12 in right buttocks  . GERD 01/04/2007   pt reports resolved with weight loss  . GLAUCOMA 07/30/2008  . History of blood transfusion    no abnormal  reaction  . HYPERLIPIDEMIA 01/04/2007   taking Pravastatin daily  . HYPERTENSION 01/04/2007   takes Lisinopril daily  . Joint pain   . Joint swelling   . LEG PAIN, LEFT 07/06/2007  . NUMBNESS 07/30/2008   in fingers;pt states from Diamox  . OSTEOARTHRITIS, HIP 09/25/2009  . OTITIS MEDIA, ACUTE, BILATERAL 02/29/2008  . Overweight(278.02) 01/04/2007  . PONV (postoperative nausea and vomiting)   . SLEEP APNEA, OBSTRUCTIVE    doesn't use a cpap;study done about 47yr ago  . TRANSIENT ISCHEMIC ATTACK, HX OF 01/04/2007  . Vision loss    left eye    Past Surgical History:  Procedure Laterality Date  . ABDOMINAL HYSTERECTOMY  2000  . CHOLECYSTECTOMY    . EYE SURGERY  13   shunt left and lazer eye surgery on right cataract and retenia tear with repair  . growth removal  2004   from thumb  . KNEE ARTHROSCOPY Right   . OOPHORECTOMY    . TOTAL HIP ARTHROPLASTY  06/24/2011   Procedure:  TOTAL HIP ARTHROPLASTY;  Surgeon: FKerin Salen  [facility] [facility]  Service: Orthopedics;  Laterality: Right;  . TOTAL HIP ARTHROPLASTY Left 11/12/2012   Dr RMayer Camel . TOTAL HIP ARTHROPLASTY Left 11/12/2012   Procedure: TOTAL HIP ARTHROPLASTY;  Surgeon: FKerin Salen MD;  [facility]  Service: Orthopedics;  Laterality: Left;  DEPUY PINNACLE    There were no vitals filed for this visit.       Subjective Assessment - 02/03/16 1137    Subjective Patient reports she is here to be seen by her medical team for her newly diagnosed right breast cancer.   Patient is accompained by: Family member   Pertinent History Patient was diagnosed on 01/13/16 with right invasive breast cancer.  There are 2 areas: 9 mm in the lower inner quadrant and 2 cm in the upper outer quadrant.  The larger area has a Ki67 of 15% and the smaller area has a Ki67 of 10%.  Both areas are ER/PR positive and HER2 negative. She also has a history of endometrial cancer in 1997.    Patient Stated Goals reduce lymphedema risk and learn post op shoulder ROM HEP            [facility] - 02[facility]16 0001      Assessment  Medical Diagnosis Right breast cancer   Referring Provider Dr. Alphonsa Overall   Onset Date/Surgical Date 01/13/16   Hand Dominance Right   Prior Therapy none     Precautions   Precautions Other (comment)   Precaution Comments Active cancer     Restrictions   Weight Bearing Restrictions No     Balance Screen   Has the patient fallen in the past 6 months No   Has the patient had a decrease in activity level because of a fear of falling?  No   Is the patient reluctant to leave their home because of a fear of falling?  No     Home Ecologist residence   Living Arrangements Spouse/significant other   Available Help at Discharge Family     Prior Function   Level of [place] Retired   Leisure She reports she has just started walking "some"      Cognition   Overall Cognitive Status Within Functional Limits for tasks assessed     Posture/Postural Control   Posture/Postural Control Postural limitations   Postural Limitations Rounded Shoulders;Forward head     ROM / Strength   AROM / PROM / Strength AROM;Strength     AROM   AROM Assessment Site Shoulder;Cervical   Right/Left Shoulder Right;Left   Right Shoulder Extension 54 Degrees   Right Shoulder Flexion 148 Degrees   Right Shoulder ABduction 168 Degrees   Right Shoulder Internal Rotation 72 Degrees   Right Shoulder External Rotation 75 Degrees   Left Shoulder Extension 50 Degrees   Left Shoulder Flexion 141 Degrees   Left Shoulder ABduction 165 Degrees   Left Shoulder Internal Rotation 74 Degrees   Left Shoulder External Rotation 75 Degrees   Cervical Flexion WNL   Cervical Extension WNL   Cervical - Right Side Bend WNL   Cervical - Left Side Bend 25% limited   Cervical - Right Rotation WNL   Cervical - Left Rotation WNL     Strength   Overall Strength Within functional limits for tasks performed           LYMPHEDEMA/ONCOLOGY QUESTIONNAIRE - 02/03/16 1136      Type   Cancer Type Right breast cancer     Lymphedema Assessments   Lymphedema Assessments Upper extremities     Right Upper Extremity Lymphedema   10 cm Proximal to Olecranon Process 40.9 cm   Olecranon Process 32.8 cm   10 cm Proximal to Ulnar Styloid Process 30.1 cm   Just Proximal to Ulnar Styloid Process 20 cm   Across Hand at [employer] 19.5 cm   At [place] of 2nd Digit 6.6 cm     Left Upper Extremity Lymphedema   10 cm Proximal to Olecranon Process 41.6 cm   Olecranon Process 32.3 cm   10 cm Proximal to Ulnar Styloid Process 30 cm   Just Proximal to Ulnar Styloid Process 19.8 cm   Across Hand at [employer] 19.5 cm   At [place] of 2nd Digit 6.8 cm                        PT Education - 02/03/16 1136    Education provided Yes   Education Details Lymphedema  risk reduction and post op shoulder ROM HEP   Person(s) Educated Patient;Spouse   Methods Explanation;Demonstration;Handout   Comprehension Verbalized understanding;Returned demonstration  Breast Clinic Goals - 02/03/16 1220/08/15      Patient will be able to verbalize understanding of pertinent lymphedema risk reduction practices relevant to her diagnosis specifically related to skin care.   Time 1   Period Days   Status Achieved     Patient will be able to return demonstrate and/or verbalize understanding of the post-op home exercise program related to regaining shoulder range of motion.   Time 1   Period Days   Status Achieved     Patient will be able to verbalize understanding of the importance of attending the postoperative After Breast Cancer Class for further lymphedema risk reduction education and therapeutic exercise.   Time 1   Period Days   Status Achieved              Plan - 02/03/16 1138    Clinical Impression Statement Patient was diagnosed on 01/13/16 with right invasive breast cancer.  There are 2 areas: 9 mm in the lower inner quadrant and 2 cm in the upper outer quadrant.  The larger area has a Ki67 of 15% and the smaller area has a Ki67 of 10%.  Both areas are ER/PR positive and HER2 negative. She also has a history of endometrial cancer in 1995-08-16.   Her multidisciplinary medical team met prior to her assessments to determine a recommended treatment plan. She is planning to have an MRI and genetic testing.  Depending on those results, she will either have a double lumpectomy or mastectomy, both would involve a sentinel node biopsy.  If she has a lumpectomy, she would likely undergo radiation and anti-estrogen therapy.  She may benefit from post op PT to regain shoulder ROM.   Rehab Potential Excellent   Clinical Impairments Affecting Rehab Potential none   PT Frequency One time visit   PT Treatment/Interventions Patient/family education;Therapeutic  exercise   PT Next Visit Plan Will f/u after surgery to determine needs   PT Home Exercise Plan Post op shoulder ROM HEP   Consulted and Agree with Plan of Care Patient;Family member/caregiver   Family Member Consulted Husband      Patient will benefit from skilled therapeutic intervention in order to improve the following deficits and impairments:  Postural dysfunction, Pain, Decreased knowledge of precautions, Impaired UE functional use, Decreased range of motion  Visit Diagnosis: Abnormal posture - Plan: PT plan of care cert/re-cert  Breast cancer of upper-outer quadrant of right female breast [facility]) - Plan: PT plan of care cert/re-cert      G-Codes - 98/33/82 15-Aug-1220    Functional Assessment Tool Used Clinical Judgement   Functional Limitation Other PT primary   Other PT Primary Current Status (N0539) At least 1 percent but less than 20 percent impaired, limited or restricted   Other PT Primary Goal Status (J6734) At least 1 percent but less than 20 percent impaired, limited or restricted   Other PT Primary Discharge Status (L9379) At least 1 percent but less than 20 percent impaired, limited or restricted     Patient will follow up at outpatient cancer rehab if needed following surgery.  If the patient requires physical therapy at that time, a specific plan will be dictated and sent to the referring physician for approval. The patient was educated today on appropriate basic range of motion exercises to begin post operatively and the importance of attending the After Breast Cancer class following surgery.  Patient was educated today on lymphedema risk reduction practices as it pertains to recommendations that will  benefit the patient immediately following surgery.  She verbalized good understanding.  No additional physical therapy is indicated at this time.     Problem List Patient Active Problem List   Diagnosis Date Noted  . Obesity, Class III, BMI 40-49.9 (morbid obesity) ([place])  02/03/2016  . Breast cancer of upper-outer quadrant of right female breast ([place]) 01/26/2016  . Retinal vein occlusion, branch, left 05/02/2015  . Osteoarthritis of left hip 11/12/2012  . Degenerative arthritis of hip 05/28/2012  . Preventative health care 10/22/2010  . OSTEOARTHRITIS, HIP 09/25/2009  . GLAUCOMA 07/30/2008  . NUMBNESS 07/30/2008  . Dizziness and giddiness 02/29/2008  . LEG PAIN, LEFT 07/06/2007  . DVT, HX OF 07/06/2007  . Diabetes ([place]) 01/04/2007  . Hyperlipidemia 01/04/2007  . Depression with anxiety 01/04/2007  . SLEEP APNEA, OBSTRUCTIVE 01/04/2007  . Essential hypertension 01/04/2007  . ASTHMA 01/04/2007  . GERD 01/04/2007  . TRANSIENT ISCHEMIC ATTACK, HX OF 01/04/2007    Annia Friendly, PT 02/03/16 12:25 PM   [place], [place], [address] Phone: [phone]   Fax:  [phone]  Name: [name] MRN: [id number] Date of Birth: [birth year]

## 2016-02-05 ENCOUNTER — Other Ambulatory Visit: Payer: Self-pay | Admitting: Oncology

## 2016-02-05 DIAGNOSIS — C50411 Malignant neoplasm of upper-outer quadrant of right female breast: Secondary | ICD-10-CM

## 2016-02-09 ENCOUNTER — Ambulatory Visit (HOSPITAL_COMMUNITY): Admission: RE | Admit: 2016-02-09 | Payer: Medicare Other | Source: Ambulatory Visit

## 2016-02-11 ENCOUNTER — Telehealth: Payer: Self-pay | Admitting: *Deleted

## 2016-02-11 NOTE — Telephone Encounter (Signed)
  Oncology Nurse Navigator Documentation    Navigator Encounter Type: Telephone (Left vm for return call regarding [facility] 02/03/16) (02/11/16 1300) Telephone: [place] [facility] Call;Clinic/MDC Follow-up (Contact information provided.) (02/11/16 1300)                                        Time Spent with Patient: 15 (02/11/16 1300)

## 2016-02-12 DIAGNOSIS — C801 Malignant (primary) neoplasm, unspecified: Secondary | ICD-10-CM

## 2016-02-12 HISTORY — DX: Malignant (primary) neoplasm, unspecified: C80.1

## 2016-02-16 ENCOUNTER — Encounter: Payer: Self-pay | Admitting: Genetic Counselor

## 2016-02-16 ENCOUNTER — Ambulatory Visit: Payer: Self-pay | Admitting: Genetic Counselor

## 2016-02-16 ENCOUNTER — Telehealth: Payer: Self-pay | Admitting: Genetic Counselor

## 2016-02-16 DIAGNOSIS — Z1379 Encounter for other screening for genetic and chromosomal anomalies: Secondary | ICD-10-CM | POA: Insufficient documentation

## 2016-02-16 DIAGNOSIS — C50411 Malignant neoplasm of upper-outer quadrant of right female breast: Secondary | ICD-10-CM

## 2016-02-16 DIAGNOSIS — Z803 Family history of malignant neoplasm of breast: Secondary | ICD-10-CM

## 2016-02-16 DIAGNOSIS — Z8542 Personal history of malignant neoplasm of other parts of uterus: Secondary | ICD-10-CM

## 2016-02-16 NOTE — Progress Notes (Signed)
HPI: [name] was previously seen in the [place] clinic due to a personal and family history of cancer and concerns regarding a hereditary predisposition to cancer. Please refer to our prior cancer genetics clinic note for more information regarding [name]'s medical, social and family histories, and our assessment and recommendations, at the time. [name] recent genetic test results were disclosed to her, as were recommendations warranted by these results. These results and recommendations are discussed in more detail below.  FAMILY HISTORY:  We obtained a detailed, 4-generation family history.  Significant diagnoses are listed below: Family History  Problem Relation Age of Onset  . Dementia Mother   . Cancer Mother     Breast and lung cancer  . Stroke Sister   . Breast cancer Sister 31  . Heart attack Maternal Aunt   . Lung cancer Maternal Grandmother     non smoker  . Glaucoma Maternal Grandfather   . Anesthesia problems Neg Hx     The patient has one son who is cancer free.  Her sister was diagnosed with breast cancer at 58 and died.  The patient's mother was diagnosed with breast cancer at 28 and lung cancer at 40.  Her father died of a heart attack at 89.  His brother died in his teens and his parents "died young', so there is limited family history on the paternal side.  The patient's mother had a brother and two sisters.  One sister died in infancy, her other sister died of a heart attack at 44. The brother died in a house fire.  The patient's maternal grandmother died of lung cancer.  Patient's maternal ancestors are of Caucasian descent, and paternal ancestors are of Caucasian descent. There is no reported Ashkenazi Jewish ancestry. There is no known consanguinity.  GENETIC TEST RESULTS: At the time of [name]'s visit, we recommended she pursue genetic testing of the custom gene panel containing genes on the breast/ovairan cancer panel and the endometrial  cancer panel. The Custom gene panel offered by [facility] includes sequencing and rearrangement analysis for the following 22 genes:  ATM, BARD1, BRCA1, BRCA2, BRIP1, CDH1, CHEK2, EPCAM, FANCC, MLH1, MSH2, MSH6, MUTYH, NBN, PALB2, PMS2, POLD1, PTEN, RAD51C, RAD51D, TP53, and XRCC2.   The report date is January 13, 2016.  Genetic testing was normal, and did not reveal a deleterious mutation in these genes. The test report has been scanned into EPIC and is located under the Molecular Pathology section of the Results Review tab.   We discussed with [name] that since the current genetic testing is not perfect, it is possible there may be a gene mutation in one of these genes that current testing cannot detect, but that chance is small. We also discussed, that it is possible that another gene that has not yet been discovered, or that we have not yet tested, is responsible for the cancer diagnoses in the family, and it is, therefore, important to remain in touch with cancer genetics in the future so that we can continue to offer [name] the most up to date genetic testing.   CANCER SCREENING RECOMMENDATIONS: This result is reassuring and indicates that [name] likely does not have an increased risk for a future cancer due to a mutation in one of these genes. This normal test also suggests that [name]'s cancer was most likely not due to an inherited predisposition associated with one of these genes.  Most cancers happen by chance and  this negative test suggests that her cancer falls into this category.  We, therefore, recommended she continue to follow the cancer management and screening guidelines provided by her oncology and primary healthcare provider.   RECOMMENDATIONS FOR FAMILY MEMBERS: Women in this family might be at some increased risk of developing cancer, over the general population risk, simply due to the family history of cancer. We recommended women in this family have a yearly mammogram  beginning at age 24, or 76 years younger than the earliest onset of cancer, an an annual clinical breast exam, and perform monthly breast self-exams. Women in this family should also have a gynecological exam as recommended by their primary provider. All family members should have a colonoscopy by age 82.  FOLLOW-UP: Lastly, we discussed with [name] that cancer genetics is a rapidly advancing field and it is possible that new genetic tests will be appropriate for her and/or her family members in the future. We encouraged her to remain in contact with cancer genetics on an annual basis so we can update her personal and family histories and let her know of advances in cancer genetics that may benefit this family.   Our contact number was provided. [name] questions were answered to her satisfaction, and she knows she is welcome to call us at anytime with additional questions or concerns.   Roma Kayser, MS, [facility] Certified Genetic Counselor [email or web address]

## 2016-02-16 NOTE — Telephone Encounter (Signed)
Revealed negative genetic testing on the custom panel that looked at genes associated with breast/ovarian and uterine cancer.  Patient voiced her understanding.

## 2016-02-21 ENCOUNTER — Ambulatory Visit
Admission: RE | Admit: 2016-02-21 | Discharge: 2016-02-21 | Disposition: A | Discharge status: Home / Self Care | Payer: Medicare Other | Source: Ambulatory Visit | Attending: Surgery | Admitting: Surgery

## 2016-02-21 DIAGNOSIS — D0511 Intraductal carcinoma in situ of right breast: Secondary | ICD-10-CM | POA: Diagnosis not present

## 2016-02-21 MED ORDER — GADOBENATE DIMEGLUMINE 529 MG/ML IV SOLN
20.0000 mL | Freq: Once | INTRAVENOUS | Status: AC | PRN
  Administered 2016-02-21: 20 mL via INTRAVENOUS

## 2016-02-24 ENCOUNTER — Other Ambulatory Visit: Payer: Self-pay | Admitting: Surgery

## 2016-02-24 ENCOUNTER — Ambulatory Visit (HOSPITAL_COMMUNITY): Payer: Medicare Other

## 2016-02-24 DIAGNOSIS — C50911 Malignant neoplasm of unspecified site of right female breast: Secondary | ICD-10-CM

## 2016-02-29 ENCOUNTER — Encounter: Payer: Self-pay | Admitting: Surgery

## 2016-02-29 DIAGNOSIS — H2013 Chronic iridocyclitis, bilateral: Secondary | ICD-10-CM | POA: Diagnosis not present

## 2016-03-01 ENCOUNTER — Other Ambulatory Visit: Payer: Self-pay | Admitting: Surgery

## 2016-03-01 DIAGNOSIS — C50911 Malignant neoplasm of unspecified site of right female breast: Secondary | ICD-10-CM | POA: Diagnosis not present

## 2016-03-02 ENCOUNTER — Ambulatory Visit
Admission: RE | Admit: 2016-03-02 | Discharge: 2016-03-02 | Disposition: A | Discharge status: Home / Self Care | Payer: Medicare Other | Source: Ambulatory Visit | Attending: Surgery | Admitting: Surgery

## 2016-03-02 ENCOUNTER — Other Ambulatory Visit: Payer: Self-pay | Admitting: Surgery

## 2016-03-02 DIAGNOSIS — R599 Enlarged lymph nodes, unspecified: Secondary | ICD-10-CM | POA: Diagnosis not present

## 2016-03-02 DIAGNOSIS — C50911 Malignant neoplasm of unspecified site of right female breast: Secondary | ICD-10-CM

## 2016-03-02 DIAGNOSIS — C773 Secondary and unspecified malignant neoplasm of axilla and upper limb lymph nodes: Secondary | ICD-10-CM | POA: Diagnosis not present

## 2016-03-02 DIAGNOSIS — C801 Malignant (primary) neoplasm, unspecified: Secondary | ICD-10-CM | POA: Diagnosis not present

## 2016-03-02 DIAGNOSIS — R590 Localized enlarged lymph nodes: Secondary | ICD-10-CM | POA: Diagnosis not present

## 2016-03-04 ENCOUNTER — Other Ambulatory Visit: Payer: Self-pay | Admitting: Oncology

## 2016-03-04 DIAGNOSIS — C50411 Malignant neoplasm of upper-outer quadrant of right female breast: Secondary | ICD-10-CM

## 2016-03-04 DIAGNOSIS — Z8542 Personal history of malignant neoplasm of other parts of uterus: Secondary | ICD-10-CM

## 2016-03-04 NOTE — Progress Notes (Signed)
Discussed results of LN biopsy and discussed w Dr Lucia Gaskins-- we will stage her next week and I will see her 10/3-- likelt will start anastrozole then and continue for 7 weeks at which point if she has not had significant response we will do chemo and [facility].

## 2016-03-07 DIAGNOSIS — Z23 Encounter for immunization: Secondary | ICD-10-CM | POA: Diagnosis not present

## 2016-03-08 NOTE — Telephone Encounter (Signed)
This encounter was created in error - please disregard.

## 2016-03-10 ENCOUNTER — Encounter (HOSPITAL_COMMUNITY): Payer: Self-pay

## 2016-03-10 ENCOUNTER — Ambulatory Visit (HOSPITAL_COMMUNITY)
Admission: RE | Admit: 2016-03-10 | Discharge: 2016-03-10 | Disposition: A | Discharge status: Home / Self Care | Payer: Medicare Other | Source: Ambulatory Visit | Attending: Oncology | Admitting: Oncology

## 2016-03-10 ENCOUNTER — Other Ambulatory Visit: Payer: Self-pay | Admitting: Oncology

## 2016-03-10 DIAGNOSIS — K5730 Diverticulosis of large intestine without perforation or abscess without bleeding: Secondary | ICD-10-CM | POA: Insufficient documentation

## 2016-03-10 DIAGNOSIS — I700 Atherosclerosis of aorta: Secondary | ICD-10-CM | POA: Insufficient documentation

## 2016-03-10 DIAGNOSIS — C771 Secondary and unspecified malignant neoplasm of intrathoracic lymph nodes: Secondary | ICD-10-CM | POA: Diagnosis not present

## 2016-03-10 DIAGNOSIS — K429 Umbilical hernia without obstruction or gangrene: Secondary | ICD-10-CM | POA: Insufficient documentation

## 2016-03-10 DIAGNOSIS — Z853 Personal history of malignant neoplasm of breast: Secondary | ICD-10-CM | POA: Diagnosis not present

## 2016-03-10 DIAGNOSIS — C50411 Malignant neoplasm of upper-outer quadrant of right female breast: Secondary | ICD-10-CM | POA: Diagnosis not present

## 2016-03-10 DIAGNOSIS — R911 Solitary pulmonary nodule: Secondary | ICD-10-CM | POA: Insufficient documentation

## 2016-03-10 DIAGNOSIS — I2510 Atherosclerotic heart disease of native coronary artery without angina pectoris: Secondary | ICD-10-CM | POA: Insufficient documentation

## 2016-03-10 DIAGNOSIS — K439 Ventral hernia without obstruction or gangrene: Secondary | ICD-10-CM | POA: Diagnosis not present

## 2016-03-10 DIAGNOSIS — C779 Secondary and unspecified malignant neoplasm of lymph node, unspecified: Secondary | ICD-10-CM | POA: Diagnosis not present

## 2016-03-10 DIAGNOSIS — K760 Fatty (change of) liver, not elsewhere classified: Secondary | ICD-10-CM | POA: Diagnosis not present

## 2016-03-10 DIAGNOSIS — Z8542 Personal history of malignant neoplasm of other parts of uterus: Secondary | ICD-10-CM | POA: Insufficient documentation

## 2016-03-10 MED ORDER — IOPAMIDOL (ISOVUE-300) INJECTION 61%
100.0000 mL | Freq: Once | INTRAVENOUS | Status: AC | PRN
  Administered 2016-03-10: 100 mL via INTRAVENOUS

## 2016-03-10 NOTE — Progress Notes (Signed)
[place]  [phone] [phone]) [phone]     ID: [name] DOB: [birth year]  MR#: [id number]  VQM#:[id number]  Patient Care Team: Biagio Borg, MD as PCP - General Alphonsa Overall, MD as Consulting Physician (General Surgery) Chauncey Cruel, MD as Consulting Physician (Oncology) Kyung Rudd, MD as Consulting Physician (Radiation Oncology) Bobbye Charleston, MD as Consulting Physician (Obstetrics and Gynecology) Nada Libman, MD as Referring Physician (Specialist) Vevelyn Royals, MD as Consulting Physician (Ophthalmology) OTHER MD:  CHIEF COMPLAINT: Estrogen receptor positive breast cancer  CURRENT TREATMENT: Awaiting definitive surgery   BREAST CANCER HISTORY: [name] had screening mammography showing some suspicious calcifications in the right breast leading to right diagnostic mammography with ultrasonography 01/22/2016 at [facility]. The breast density was category C. In the upper right breast there was a 2.3 cm mass with additional masses measuring 0.9 and 0.7 cm. There was also a possible additional 0.8 mass in the lower inner quadrant. Ultrasound confirmed an irregular hypoechoic mass in the right breast upper outer quadrant measuring 2.0 cm. There were other masses measuring 0.7 and 0.8 cm by ultrasonography. The right axilla was sonographically benign.  Biopsy of a 12:00 and 4:00 mass in the right breast 01/22/2016 showed ([id number]) both specimens showing invasive ductal carcinoma, grade 1 or 2, both 95% estrogen receptor positive, both 95% progesterone receptor positive, both with strong staining intensity, with MIB-1 ranging from 10-15%, and both HER-2 negative, the signals ratio being 1.23-1.42, and the number per cell 1.85-2.59.  Her subsequent history is as detailed below INTERVAL HISTORY: He was evaluated in the multidisciplinary breast cancer clinic 02/03/2016 accompanied by her husband [name]. Her case was also presented in the  multidisciplinary breast cancer conference that same morning. At that time a preliminary plan was proposed: Breast MRI to help decide whether to lumpectomy or a mastectomy was performed, Oncotype, adjuvant radiation and adjuvant anti-estrogens  REVIEW OF SYSTEMS: There were no specific symptoms leading to the original mammogram, which was routinely scheduled. [name] does have multiple chronic issues including insomnia, fatigue, pain in her legs and feet, difficulty walking status post bilateral hip replacements, problems with vision currently being treated with prednisone forte, deconditioning, dry cough, feeling cold, stress urinary incontinence, arthritis, diabetes, asthma, and obesity. A detailed review of systems was otherwise stable.  PAST MEDICAL HISTORY: Past Medical History:  Diagnosis Date  . ANXIETY 01/04/2007  . DIABETES MELLITUS, TYPE II 01/04/2007   only takes actoplus daily  . Dizziness and giddiness 02/29/2008  . DVT, HX OF    at age 60 in right buttocks  . Family history of breast cancer   . GERD 01/04/2007   pt reports resolved with weight loss  . GLAUCOMA 07/30/2008  . History of blood transfusion    no abnormal  reaction  . History of uterine cancer   . HYPERLIPIDEMIA 01/04/2007   taking Pravastatin daily  . HYPERTENSION 01/04/2007   takes Lisinopril daily  . Joint pain   . Joint swelling   . LEG PAIN, LEFT 07/06/2007  . NUMBNESS 07/30/2008   in fingers;pt states from Diamox  . OSTEOARTHRITIS, HIP 09/25/2009  . OTITIS MEDIA, ACUTE, BILATERAL 02/29/2008  . Overweight(278.02) 01/04/2007  . PONV (postoperative nausea and vomiting)   . SLEEP APNEA, OBSTRUCTIVE    doesn't use a cpap;study done about 67yr ago  . TRANSIENT ISCHEMIC ATTACK, HX OF 01/04/2007  . Vision loss    left eye    PAST SURGICAL HISTORY: Past Surgical History:  Procedure  Laterality Date  . ABDOMINAL HYSTERECTOMY  2000  . CHOLECYSTECTOMY    . EYE SURGERY  13   shunt left and lazer eye surgery on right  cataract and retenia tear with repair  . growth removal  2004   from thumb  . KNEE ARTHROSCOPY Right   . OOPHORECTOMY    . TOTAL HIP ARTHROPLASTY  06/24/2011   Procedure: TOTAL HIP ARTHROPLASTY;  Surgeon: Nestor Lewandowsky;  Location: [facility] OR;  Service: Orthopedics;  Laterality: Right;  . TOTAL HIP ARTHROPLASTY Left 11/12/2012   Dr Turner Daniels  . TOTAL HIP ARTHROPLASTY Left 11/12/2012   Procedure: TOTAL HIP ARTHROPLASTY;  Surgeon: Nestor Lewandowsky, MD;  Location: [facility] OR;  Service: Orthopedics;  Laterality: Left;  DEPUY PINNACLE    FAMILY HISTORY Family History  Problem Relation Age of Onset  . Dementia Mother   . Cancer Mother     Breast and lung cancer  . Stroke Sister   . Breast cancer Sister 29  . Heart attack Maternal Aunt   . Lung cancer Maternal Grandmother     non smoker  . Glaucoma Maternal Grandfather   . Anesthesia problems Neg Hx   The patient's father died at age 36, the patient's mother died at age 21. She had breast and lung cancers diagnosed shortly before her death. The patient had no brothers, 2 sisters. One sister was diagnosed with breast cancer at the age of 56.  GYNECOLOGIC HISTORY:  No LMP recorded. Patient has had a hysterectomy. Menarche age 89, first live birth age 49, the patient is GX P1. She had a hysterectomy for endometrial cancer in the year 2000. She did not take hormone replacement. She did use oral contraceptives for your than 20 years remotely, with no complications.  SOCIAL HISTORY:  [name] is retired. She is home with her husband [name]. Their son [name] also lives in [place], currently between jobs.    ADVANCED DIRECTIVES: In place  HEALTH MAINTENANCE: Social History  Substance Use Topics  . Smoking status: Never Smoker  . Smokeless tobacco: Never Used  . Alcohol use No     Colonoscopy: Never  PAP: Status post hysterectomy  Bone density: Remote   Allergies  Allergen Reactions  . Lipitor [Atorvastatin Calcium]     Leg cramp  . Oxycodone Nausea  And Vomiting    Patient vomited for 3 days after taking  . Sitagliptin Phosphate Nausea And Vomiting  . Sulfa Drugs Cross Reactors Nausea And Vomiting    Current Outpatient Prescriptions  Medication Sig Dispense Refill  . aspirin 81 MG tablet Take 81 mg by mouth daily. Reported on 06/12/2015    . bimatoprost (LUMIGAN) 0.03 % ophthalmic solution Place 1 drop into both eyes at bedtime.    . Brimonidine Tartrate-Timolol (COMBIGAN OP) Place 1 drop into both eyes 2 (two) times daily.     Marland Kitchen lisinopril-hydrochlorothiazide (PRINZIDE,ZESTORETIC) 20-12.5 MG tablet TAKE ONE TABLET BY MOUTH EVERY DAY    . lovastatin (MEVACOR) 40 MG tablet Take 1 tablet (40 mg total) by mouth at bedtime. 90 tablet 3  . pioglitazone-metformin (ACTOPLUS MET) 15-500 MG tablet TAKE ONE TABLET BY MOUTH ONCE DAILY 90 tablet 2  . pioglitazone-metformin (ACTOPLUS MET) 15-500 MG tablet Take by mouth.    . prednisoLONE acetate (PRED FORTE) 1 % ophthalmic suspension 1 drop 4 (four) times daily.     No current facility-administered medications for this visit.     OBJECTIVE: Middle-aged white woman who appears stated age There were no vitals filed  for this visit.   There is no height or weight on file to calculate BMI.    ECOG FS:2 - Symptomatic, <50% confined to bed  Ocular: Sclerae unicteric, EOMs intact Ear-nose-throat: Oropharynx clear and moist Lymphatic: No cervical or supraclavicular adenopathy Lungs no rales or rhonchi, good excursion bilaterally Heart regular rate and rhythm, no murmur appreciated Abd soft, obese, nontender, positive bowel sounds MSK no focal spinal tenderness, no joint edema Neuro: non-focal, well-oriented, appropriate affect Breasts: The right breast is status post recent biopsy. There is a moderate ecchymosis. There are no skin or nipple changes of concern. The right axilla is benign. The left breast is unremarkable   LAB RESULTS:  CMP     Component Value Date/Time   NA 141 02/03/2016 0813     K 4.0 02/03/2016 0813   CL 106 04/29/2015 1730   CO2 26 02/03/2016 0813   GLUCOSE 200 (H) 02/03/2016 0813   BUN 10.2 02/03/2016 0813   CREATININE 0.8 02/03/2016 0813   CALCIUM 9.1 02/03/2016 0813   PROT 7.1 02/03/2016 0813   ALBUMIN 3.6 02/03/2016 0813   AST 12 02/03/2016 0813   ALT 13 02/03/2016 0813   ALKPHOS 54 02/03/2016 0813   BILITOT 0.48 02/03/2016 0813   GFRNONAA >90 11/13/2012 0430   GFRAA >90 11/13/2012 0430    INo results found for: SPEP, UPEP  Lab Results  Component Value Date   WBC 7.9 02/03/2016   NEUTROABS 5.1 02/03/2016   HGB 12.0 02/03/2016   HCT 36.0 02/03/2016   MCV 82.6 02/03/2016   PLT 187 02/03/2016      Chemistry      Component Value Date/Time   NA 141 02/03/2016 0813   K 4.0 02/03/2016 0813   CL 106 04/29/2015 1730   CO2 26 02/03/2016 0813   BUN 10.2 02/03/2016 0813   CREATININE 0.8 02/03/2016 0813      Component Value Date/Time   CALCIUM 9.1 02/03/2016 0813   ALKPHOS 54 02/03/2016 0813   AST 12 02/03/2016 0813   ALT 13 02/03/2016 0813   BILITOT 0.48 02/03/2016 0813       No results found for: LABCA2  No components found for: LABCA125  No results for input(s): INR in the last 168 hours.  Urinalysis    Component Value Date/Time   COLORURINE YELLOW 10/30/2014 1513   APPEARANCEUR CLEAR 10/30/2014 1513   LABSPEC 1.010 10/30/2014 1513   PHURINE 7.0 10/30/2014 1513   GLUCOSEU NEGATIVE 10/30/2014 1513   HGBUR NEGATIVE 10/30/2014 1513   BILIRUBINUR NEGATIVE 10/30/2014 1513   KETONESUR NEGATIVE 10/30/2014 1513   PROTEINUR NEGATIVE 11/08/2012 1319   UROBILINOGEN 0.2 10/30/2014 1513   NITRITE NEGATIVE 10/30/2014 1513   LEUKOCYTESUR NEGATIVE 10/30/2014 1513     STUDIES: Ct Chest W Contrast  Result Date: 03/10/2016 CLINICAL DATA:  70 year old female with history of right-sided breast cancer diagnosed in August 2017. Additional history of uterine cancer diagnosed in 2000. EXAM: CT CHEST, ABDOMEN, AND PELVIS WITH CONTRAST  TECHNIQUE: Multidetector CT imaging of the chest, abdomen and pelvis was performed following the standard protocol during bolus administration of intravenous contrast. CONTRAST:  112m ISOVUE-300 IOPAMIDOL (ISOVUE-300) INJECTION 61% COMPARISON:  None. FINDINGS: CT CHEST FINDINGS Cardiovascular: Heart size is normal. There is no significant pericardial fluid, thickening or pericardial calcification. There is aortic atherosclerosis, as well as atherosclerosis of the great vessels of the mediastinum and the coronary arteries, including calcified atherosclerotic plaque in the left anterior descending and left circumflex coronary arteries. Mediastinum/Nodes: Multiple with  borderline enlarged and mildly enlarged mediastinal and bilateral hilar lymph nodes measuring up to 16 mm in short axis in the subcarinal nodal station. Esophagus is unremarkable in appearance. Multiple prominent borderline enlarged right axillary lymph nodes measuring up to 8 mm in short axis. No left axillary lymphadenopathy. Lungs/Pleura: There are innumerable pulmonary nodules scattered throughout the lungs bilaterally, predominantly in a perilymphatic distribution, highly concerning for widespread lymphangitic spread of disease. The largest pulmonary nodules include a subpleural 7 x 11 mm nodule in the anterior aspect of the right lower lobe (image 71 of series 5), and a subpleural 11 x 7 mm nodule in the inferior aspect of the left lower lobe (image 98 of series 5). Extensive pleural thickening and nodularity is noted bilaterally (right greater than left). No pleural effusions. Musculoskeletal: In the superior aspect of the right breast there are several enhancing soft tissue nodules, the largest of which measures 3.0 x 1.5 x 1.7 cm (axial image 20 of series 2 and coronal image 56 of series 602). There are no aggressive appearing lytic or blastic lesions noted in the visualized portions of the skeleton. CT ABDOMEN PELVIS FINDINGS Hepatobiliary:  Diffuse low attenuation throughout the hepatic parenchyma, compatible with a background of hepatic steatosis. No cystic or solid hepatic lesions. Status post cholecystectomy. Mild intrahepatic and moderate extrahepatic biliary ductal dilatation, favored to reflect benign post cholecystectomy physiology. No definite calcified stone identified in the common bile duct. Pancreas: No pancreatic mass. No pancreatic ductal dilatation. No pancreatic or peripancreatic fluid or inflammatory changes. Spleen: Unremarkable. Adrenals/Urinary Tract: In the upper pole of the right kidney there is a 2.1 cm intermediate attenuation (30 HU) lesion which is otherwise simple in appearance. Left kidney and right adrenal gland are normal in appearance. In the lateral limb of the left adrenal gland there is a 1 cm indeterminate nodule. No hydroureteronephrosis. Urinary bladder is partially obscured by extensive beam hardening artifact from the patient's bilateral hip arthroplasties, but the visualized portions of the urinary bladder are unremarkable in appearance. Stomach/Bowel: Normal appearance of the stomach. There is no pathologic dilatation of small bowel or colon. Numerous colonic diverticulae are noted, without surrounding inflammatory changes to suggest an acute diverticulitis at this time. Normal appendix. Vascular/Lymphatic: Aortic atherosclerosis, without evidence of aneurysm or dissection in the abdominal or pelvic vasculature. No lymphadenopathy noted in the abdomen or pelvis. Reproductive: Status post hysterectomy. Ovaries are not confidently identified may be surgically absent or atrophic. Other: Small umbilical hernia containing only omental fat. Small epigastric ventral hernia containing only omental fat. No significant volume of ascites. No pneumoperitoneum. Musculoskeletal: There are no aggressive appearing lytic or blastic lesions noted in the visualized portions of the skeleton. Postoperative changes of bilateral hip  arthroplasty are noted. IMPRESSION: 1. 3.0 x 1.5 x 1.7 cm enhancing soft tissue nodule in the superior aspect of the right breast with several smaller surrounding nodules in the right breast and multiple borderline enlarged right axillary lymph nodes. In addition, there is evidence for widespread lymphangitic spread of disease throughout the lungs bilaterally, pleural involvement bilaterally, and metastatic lymphadenopathy in the mediastinal and hilar nodal stations bilaterally, as detailed above. 2. No definite evidence of infradiaphragmatic metastatic disease in the abdomen or pelvis. 3. 2.1 cm intermediate attenuation lesion in the upper pole of the right kidney is indeterminate. Statistically, this is likely to represent a small proteinaceous cyst. The possibility of a cystic neoplasm such as a papillary renal cell carcinoma is not excluded, however, and close attention on followup  studies is recommended. 4. Hepatic steatosis. 5. Aortic atherosclerosis, in addition to 2 vessel coronary artery disease. Assessment for potential risk factor modification, dietary therapy or pharmacologic therapy may be warranted, if clinically indicated. 6. Small epigastric ventral hernia and small umbilical hernia as a, both of which contain only omental fat. No findings to suggest associated bowel incarceration or obstruction. 7. Colonic diverticulosis without evidence of acute diverticulitis at this time. 8. Additional incidental findings, as above. Electronically Signed   By: Vinnie Langton M.D.   On: 03/10/2016 09:06   Ct Abdomen Pelvis W Contrast  Result Date: 03/10/2016 CLINICAL DATA:  70 year old female with history of right-sided breast cancer diagnosed in August 2017. Additional history of uterine cancer diagnosed in 2000. EXAM: CT CHEST, ABDOMEN, AND PELVIS WITH CONTRAST TECHNIQUE: Multidetector CT imaging of the chest, abdomen and pelvis was performed following the standard protocol during bolus administration of  intravenous contrast. CONTRAST:  155m ISOVUE-300 IOPAMIDOL (ISOVUE-300) INJECTION 61% COMPARISON:  None. FINDINGS: CT CHEST FINDINGS Cardiovascular: Heart size is normal. There is no significant pericardial fluid, thickening or pericardial calcification. There is aortic atherosclerosis, as well as atherosclerosis of the great vessels of the mediastinum and the coronary arteries, including calcified atherosclerotic plaque in the left anterior descending and left circumflex coronary arteries. Mediastinum/Nodes: Multiple with borderline enlarged and mildly enlarged mediastinal and bilateral hilar lymph nodes measuring up to 16 mm in short axis in the subcarinal nodal station. Esophagus is unremarkable in appearance. Multiple prominent borderline enlarged right axillary lymph nodes measuring up to 8 mm in short axis. No left axillary lymphadenopathy. Lungs/Pleura: There are innumerable pulmonary nodules scattered throughout the lungs bilaterally, predominantly in a perilymphatic distribution, highly concerning for widespread lymphangitic spread of disease. The largest pulmonary nodules include a subpleural 7 x 11 mm nodule in the anterior aspect of the right lower lobe (image 71 of series 5), and a subpleural 11 x 7 mm nodule in the inferior aspect of the left lower lobe (image 98 of series 5). Extensive pleural thickening and nodularity is noted bilaterally (right greater than left). No pleural effusions. Musculoskeletal: In the superior aspect of the right breast there are several enhancing soft tissue nodules, the largest of which measures 3.0 x 1.5 x 1.7 cm (axial image 20 of series 2 and coronal image 56 of series 602). There are no aggressive appearing lytic or blastic lesions noted in the visualized portions of the skeleton. CT ABDOMEN PELVIS FINDINGS Hepatobiliary: Diffuse low attenuation throughout the hepatic parenchyma, compatible with a background of hepatic steatosis. No cystic or solid hepatic lesions.  Status post cholecystectomy. Mild intrahepatic and moderate extrahepatic biliary ductal dilatation, favored to reflect benign post cholecystectomy physiology. No definite calcified stone identified in the common bile duct. Pancreas: No pancreatic mass. No pancreatic ductal dilatation. No pancreatic or peripancreatic fluid or inflammatory changes. Spleen: Unremarkable. Adrenals/Urinary Tract: In the upper pole of the right kidney there is a 2.1 cm intermediate attenuation (30 HU) lesion which is otherwise simple in appearance. Left kidney and right adrenal gland are normal in appearance. In the lateral limb of the left adrenal gland there is a 1 cm indeterminate nodule. No hydroureteronephrosis. Urinary bladder is partially obscured by extensive beam hardening artifact from the patient's bilateral hip arthroplasties, but the visualized portions of the urinary bladder are unremarkable in appearance. Stomach/Bowel: Normal appearance of the stomach. There is no pathologic dilatation of small bowel or colon. Numerous colonic diverticulae are noted, without surrounding inflammatory changes to suggest an acute diverticulitis  at this time. Normal appendix. Vascular/Lymphatic: Aortic atherosclerosis, without evidence of aneurysm or dissection in the abdominal or pelvic vasculature. No lymphadenopathy noted in the abdomen or pelvis. Reproductive: Status post hysterectomy. Ovaries are not confidently identified may be surgically absent or atrophic. Other: Small umbilical hernia containing only omental fat. Small epigastric ventral hernia containing only omental fat. No significant volume of ascites. No pneumoperitoneum. Musculoskeletal: There are no aggressive appearing lytic or blastic lesions noted in the visualized portions of the skeleton. Postoperative changes of bilateral hip arthroplasty are noted. IMPRESSION: 1. 3.0 x 1.5 x 1.7 cm enhancing soft tissue nodule in the superior aspect of the right breast with several  smaller surrounding nodules in the right breast and multiple borderline enlarged right axillary lymph nodes. In addition, there is evidence for widespread lymphangitic spread of disease throughout the lungs bilaterally, pleural involvement bilaterally, and metastatic lymphadenopathy in the mediastinal and hilar nodal stations bilaterally, as detailed above. 2. No definite evidence of infradiaphragmatic metastatic disease in the abdomen or pelvis. 3. 2.1 cm intermediate attenuation lesion in the upper pole of the right kidney is indeterminate. Statistically, this is likely to represent a small proteinaceous cyst. The possibility of a cystic neoplasm such as a papillary renal cell carcinoma is not excluded, however, and close attention on followup studies is recommended. 4. Hepatic steatosis. 5. Aortic atherosclerosis, in addition to 2 vessel coronary artery disease. Assessment for potential risk factor modification, dietary therapy or pharmacologic therapy may be warranted, if clinically indicated. 6. Small epigastric ventral hernia and small umbilical hernia as a, both of which contain only omental fat. No findings to suggest associated bowel incarceration or obstruction. 7. Colonic diverticulosis without evidence of acute diverticulitis at this time. 8. Additional incidental findings, as above. Electronically Signed   By: Vinnie Langton M.D.   On: 03/10/2016 09:06   Mr Breast Bilateral W Wo Contrast  Result Date: 02/22/2016 CLINICAL DATA:  70 year old female with biopsy proven grade I-II invasive ductal carcinoma in the 12 o'clock region of the right breast 10 cm from the nipple and in the 4 o'clock region of the breast. LABS:  None obtained on site at the time of imaging. EXAM: BILATERAL BREAST MRI WITH AND WITHOUT CONTRAST TECHNIQUE: Multiplanar, multisequence MR images of both breasts were obtained prior to and following the intravenous administration of 20 ml of MultiHance. THREE-DIMENSIONAL MR IMAGE  RENDERING ON INDEPENDENT WORKSTATION: Three-dimensional MR images were rendered by post-processing of the original MR data on an independent workstation. The three-dimensional MR images were interpreted, and findings are reported in the following complete MRI report for this study. Three dimensional images were evaluated at the independent Dyn[employer] workstation COMPARISON:  Previous exam(s). FINDINGS: Breast composition: b. Scattered fibroglandular tissue. Background parenchymal enhancement: Moderate. Right breast: There are numerous enhancing masses in the upper aspect of the right breast. The dominant mass in the 12 o'clock region of the breast measures 2.2 x 1.7 x 1.9 cm. It is associated with a signal void artifact corresponding with the biopsy clip. Numerous other enhancing smaller masses are seen extending anteriorly from the dominant mass. The multiple enhancing masses span an area of 10.7 x 8.0 x 2.2 cm in anterior posterior, transverse and longitudinal dimensions. There is non mass like enhancement in the lower-inner quadrant of the right breast associated with a signal void artifact from the biopsy clip. The non masslike enhancement measures 2.1 x 0.9 x 1.1 cm corresponding with biopsy proven invasive ductal carcinoma in the 4 o'clock region of  the breast. Left breast: No mass or abnormal enhancement. Lymph nodes: There are several prominent right level I axillary lymph nodes. There is a 1.7 cm lymph node with a 9 mm cortex. It is suspicious for metastatic involvement. Ancillary findings:  None. IMPRESSION: Numerous enhancing masses in the superior aspect of the right breast and non masslike enhancement in the 4 o'clock region of the right breast consistent with multifocal disease. Suspicious right axillary adenopathy. RECOMMENDATION: Treatment planning of the known right breast cancers is recommended. If clinically indicated second-look ultrasound for abnormal right axillary adenopathy and possible biopsy  could be performed. BI-RADS CATEGORY  6: Known biopsy-proven malignancy. Electronically Signed   By: Baird Lyons M.D.   On: 02/22/2016 11:21   [place] Extrem Up Right Ltd  Result Date: 03/02/2016 CLINICAL DATA:  Recent diagnosis of right breast invasive ductal carcinoma at 2 sites in the right breast, at the 12 o'clock and 4 o'clock locations. On the breast MRI performed 02/21/2016, there were several prominent level 1 axillary lymph nodes, with thickened cortices. Second-look ultrasound is requested, to assess if ultrasound-guided biopsy can be performed of any of the potentially suspicious lymph nodes. EXAM: ULTRASOUND RIGHT UPPER EXTREMITY LIMITED TECHNIQUE: Ultrasound examination of the upper extremity soft tissues was performed in the area of clinical concern. COMPARISON:  Recent bilateral mammogram, right breast ultrasound, right breast biopsies, and breast MRI. The breast MRI was performed 02/21/2016. FINDINGS: There are several level 1 right axillary lymph nodes with focal areas of cortical thickening. Cortical thickening measures from 5 to 7 mm. At least 3 lymph nodes with cortical thickening are identified on ultrasound. IMPRESSION: Suspicious right axillary lymph nodes. Ultrasound-guided biopsy will be performed of one of the lymph nodes today and dictated separately. Electronically Signed   By: Britta Mccreedy M.D.   On: 03/02/2016 15:30   [place] Rt Breast Bx W Loc Dev 1st Lesion Img Bx Spec US Guide  Addendum Date: 03/07/2016   ADDENDUM REPORT: 03/03/2016 15:42 ADDENDUM: Pathology revealed LYMPH NODE TISSUE WITH METASTATIC CARCINOMA of the Right axilla. This was found to be concordant by Dr. Britta Mccreedy. Pathology results were discussed with the patient by telephone. The patient reported doing well after the biopsy with tenderness at the site. Post biopsy instructions and care were reviewed and questions were answered. The patient was encouraged to call [facility] for any  additional concerns. The patient has a recent diagnosis of right breast cancer and should follow her outlined treatment plan. Pathology results reported by Rene Kocher, RN on 03/03/2016. Electronically Signed   By: Britta Mccreedy M.D.   On: 03/03/2016 15:42   Result Date: 03/07/2016 CLINICAL DATA:  Suspicious right axillary lymph nodes in patient with recently diagnosed invasive ductal carcinoma in 2 separate quadrants of the right breast. EXAM: ULTRASOUND GUIDED CORE NEEDLE BIOPSY OF A RIGHT AXILLARY NODE COMPARISON:  Previous exam(s). FINDINGS: I met with the patient and we discussed the procedure of ultrasound-guided biopsy, including benefits and alternatives. We discussed the high likelihood of a successful procedure. We discussed the risks of the procedure, including infection, bleeding, tissue injury, clip migration, and inadequate sampling. Informed written consent was given. The usual time-out protocol was performed immediately prior to the procedure. Using sterile technique and 1% Lidocaine as local anesthetic, under direct ultrasound visualization, a 14 gauge spring-loaded device was used to perform biopsy of a level 1 axillary lymph node with cortical thickening using a lateral to medial approach. At the conclusion of  the procedure a HydroMARK tissue marker clip was deployed into the biopsy cavity. The [facility] tissue marker clip is visible on the ultrasound images. IMPRESSION: Ultrasound guided biopsy of right axillary lymph node. No apparent complications. Electronically Signed: By: Curlene Dolphin M.D. On: 03/02/2016 15:31    ELIGIBLE FOR AVAILABLE RESEARCH PROTOCOL: PALLAS  ASSESSMENT: 70 y.o. [place] woman with a remote history of early stage endometrial cancer, now status post right breast upper outer quadrant biopsy 01/22/2016 for a clinically multifocal T2 N0, stage 2A invasive ductal carcinoma, grade 1, estrogen and progesterone receptor positive, HER-2 negative, with an MIB-1  between 10 and 15%.  (1) definitive surgery pending  (2) Oncotype DX to be submitted from the final pathology sample  (3) adjuvant radiation to follow as appropriate  (4) anti-estrogens to follow at the completion of local treatment  PLAN: We spent the better part of today's hour-long appointment discussing the biology of breast cancer in general, and the specifics of the patient's tumor in particular. We first reviewed the fact that cancer is not one disease but more than 100 different diseases and that it is important to keep them separate-- otherwise when friends and relatives discuss their own cancer experiences with [name] confusion can result. Similarly we explained that if breast cancer spreads to the bone or liver, the patient would not have bone cancer or liver cancer, but breast cancer in the bone and breast cancer in the liver: one cancer in three places-- not 3 different cancers which otherwise would have to be treated in 3 different ways.  We discussed the difference between local and systemic therapy. In terms of loco-regional treatment, lumpectomy plus radiation is equivalent to mastectomy as far as survival is concerned. For this reason, and because the cosmetic results are generally superior, we generally recommend breast conserving surgery. However, when dealing with multicentric disease, we will obtain an MRI to help guide the surgical decision. While separate lumpectomies in the central breast are possible, mastectomy may be preferable. This decision is pending at this time.  We then discussed the rationale for systemic therapy. There is some risk that this cancer may have already spread to other parts of her body. Patients frequently ask at this point about bone scans, CAT scans and PET scans to find out if they have occult breast cancer somewhere else. The problem is that in early stage disease we are much more likely to find false positives then true cancers and this would expose  the patient to unnecessary procedures as well as unnecessary radiation. Scans cannot answer the question the patient really would like to know, which is whether she has microscopic disease elsewhere in her body. For those reasons we do not recommend them.  Of course we would proceed to aggressive evaluation of any symptoms that might suggest metastatic disease, but that is not the case here.  Next we went over the options for systemic therapy which are anti-estrogens, anti-HER-2 immunotherapy, and chemotherapy. [name] does not meet criteria for anti-HER-2 immunotherapy. She is a good candidate for anti-estrogens.  The question of chemotherapy is more complicated. Chemotherapy is Obrien effective in rapidly growing, aggressive tumors. It is much less effective in low-grade, slow growing cancers, like [name] 's. For that reason we are going to request an Oncotype from the definitive surgical sample, as suggested by NCCN guidelines. That will help [place] make a definitive decision regarding chemotherapy in this case.  In short the plan will be to clarify the surgical decision with  an MRI, proceed to definitive surgery, obtain an Oncotype from that sample, and Obrien likely, since I anticipate the result will be "low-risk", proceed to radiation. [name] will then return to see me at the completion of her local treatment. We will then start anti-estrogens, Obrien likely anastrozole  If her cancer ends up being stage II, we will consider the PALLAS study.  [name] has a good understanding of the overall plan. She agrees with it. She knows the goal of treatment in her case is cure. She will call with any problems that may develop before her next visit here.  Chauncey Cruel, MD   03/10/2016 2:21 PM Medical Oncology and Hematology [facility] [address] [place], [place] [address] Tel. [phone]    Fax. [phone]

## 2016-03-15 ENCOUNTER — Ambulatory Visit (HOSPITAL_BASED_OUTPATIENT_CLINIC_OR_DEPARTMENT_OTHER): Payer: Medicare Other | Admitting: Oncology

## 2016-03-15 ENCOUNTER — Encounter: Payer: Self-pay | Admitting: Oncology

## 2016-03-15 VITALS — BP 140/63 | HR 63 | Temp 98.1°F | Resp 18 | Ht 64.0 in | Wt 243.2 lb

## 2016-03-15 DIAGNOSIS — Z8542 Personal history of malignant neoplasm of other parts of uterus: Secondary | ICD-10-CM

## 2016-03-15 DIAGNOSIS — C7800 Secondary malignant neoplasm of unspecified lung: Secondary | ICD-10-CM | POA: Insufficient documentation

## 2016-03-15 DIAGNOSIS — Z170 Estrogen receptor positive status [ER+]: Secondary | ICD-10-CM | POA: Diagnosis not present

## 2016-03-15 DIAGNOSIS — C50411 Malignant neoplasm of upper-outer quadrant of right female breast: Secondary | ICD-10-CM

## 2016-03-15 DIAGNOSIS — C7801 Secondary malignant neoplasm of right lung: Secondary | ICD-10-CM | POA: Diagnosis not present

## 2016-03-15 MED ORDER — LETROZOLE 2.5 MG PO TABS
2.5000 mg | ORAL_TABLET | Freq: Every day | ORAL | 4 refills | Status: DC
Start: 2016-03-15 — End: 2016-03-28

## 2016-03-15 MED ORDER — PALBOCICLIB 125 MG PO CAPS: 125.0000 mg | ORAL_CAPSULE | Freq: Every day | ORAL | 6 refills | Status: DC

## 2016-03-15 NOTE — Progress Notes (Signed)
Shannon  [phone] [phone]     ID: [name] DOB: Aug 30, 1945  MR#: [id number]  BLT#:[id number]  Patient Care Team: Biagio Borg, MD as PCP - General Alphonsa Overall, MD as Consulting Physician (General Surgery) Chauncey Cruel, MD as Consulting Physician (Oncology) Kyung Rudd, MD as Consulting Physician (Radiation Oncology) Bobbye Charleston, MD as Consulting Physician (Obstetrics and Gynecology) Nada Libman, MD as Referring Physician (Specialist) Vevelyn Royals, MD as Consulting Physician (Ophthalmology) OTHER MD:  CHIEF COMPLAINT: Estrogen receptor positive breast cancer  CURRENT TREATMENT: Letrozole, palpable palbociclib   BREAST CANCER HISTORY: From the original intake note:  [name] had screening mammography showing some suspicious calcifications in the right breast leading to right diagnostic mammography with ultrasonography 01/22/2016 at [facility]. The breast density was category C. In the upper right breast there was a 2.3 cm mass with additional masses measuring 0.9 and 0.7 cm. There was also a possible additional 0.8 mass in the lower inner quadrant. Ultrasound confirmed an irregular hypoechoic mass in the right breast upper outer quadrant measuring 2.0 cm. There were other masses measuring 0.7 and 0.8 cm by ultrasonography. The right axilla was sonographically benign.  Biopsy of a 12:00 and 4:00 mass in the right breast 01/22/2016 showed ([id number]) both specimens showing invasive ductal carcinoma, grade 1 or 2, both 95% estrogen receptor positive, both 95% progesterone receptor positive, both with strong staining intensity, with MIB-1 ranging from 10-15%, and both HER-2 negative, the signals ratio being 1.23-1.42, and the number per cell 1.85-2.59.  Her subsequent history is as detailed below  INTERVAL HISTORY: [name] returns today for follow-up of her estrogen receptor positive breast cancer accompanied by her husband  [name]. Since her last visit here she had a breast MRI 02/22/2016, which showed multiple lesions in the right breast, extending over an area of 10.7 cm, the largest lesion measuring 2.2 cm. There were also suspicious lymph nodes in the right axilla and one of these was biopsied 03/02/2016, and this showed ([id number]) metastatic breast cancer, strongly estrogen and progesterone receptor positive.  [name] was then staged with CT scans of the chest abdomen and pelvis 03/10/2016. In addition to the right breast and right axillary masses, there were multiple bilateral very small lung nodules, mostly subcentimeter, as well as mediastinal and bilateral hilar lymph nodes. This was felt to be consistent with metastatic disease and suggestive of lymphangitic spread.  [name] is here today to discuss those results.  REVIEW OF SYSTEMS: She has a dry cough, but no other pulmonary symptoms and particularly no pleurisy or hemoptysis. She is not more short of breath than anyone else she knows. She complains of pain in the medial aspect of the right breast and occasionally has a shooting pain in the right nipple. None of these are particularly intense. She does describe herself as tired. She is sleeping poorly. She has had no unusual headaches, visual changes, nausea, vomiting, dizziness, or gait imbalance. A detailed review of systems today was otherwise stable.  PAST MEDICAL HISTORY: Past Medical History:  Diagnosis Date  . ANXIETY 01/04/2007  . DIABETES MELLITUS, TYPE II 01/04/2007   only takes actoplus daily  . Dizziness and giddiness 02/29/2008  . DVT, HX OF    at age 7 in right buttocks  . Family history of breast cancer   . GERD 01/04/2007   pt reports resolved with weight loss  . GLAUCOMA 07/30/2008  . History of blood transfusion    no abnormal  reaction  . History of uterine cancer   . HYPERLIPIDEMIA 01/04/2007   taking Pravastatin daily  . HYPERTENSION 01/04/2007   takes Lisinopril daily  . Joint  pain   . Joint swelling   . LEG PAIN, LEFT 07/06/2007  . NUMBNESS 07/30/2008   in fingers;pt states from Diamox  . OSTEOARTHRITIS, HIP 09/25/2009  . OTITIS MEDIA, ACUTE, BILATERAL 02/29/2008  . Overweight(278.02) 01/04/2007  . PONV (postoperative nausea and vomiting)   . SLEEP APNEA, OBSTRUCTIVE    doesn't use a cpap;study done about 71yr ago  . TRANSIENT ISCHEMIC ATTACK, HX OF 01/04/2007  . Vision loss    left eye    PAST SURGICAL HISTORY: Past Surgical History:  Procedure Laterality Date  . ABDOMINAL HYSTERECTOMY  2000  . CHOLECYSTECTOMY    . EYE SURGERY  13   shunt left and lazer eye surgery on right cataract and retenia tear with repair  . growth removal  2004   from thumb  . KNEE ARTHROSCOPY Right   . OOPHORECTOMY    . TOTAL HIP ARTHROPLASTY  06/24/2011   Procedure: TOTAL HIP ARTHROPLASTY;  Surgeon: FKerin Salen  Location: [facility]  Service: Orthopedics;  Laterality: Right;  . TOTAL HIP ARTHROPLASTY Left 11/12/2012   Dr RMayer Camel . TOTAL HIP ARTHROPLASTY Left 11/12/2012   Procedure: TOTAL HIP ARTHROPLASTY;  Surgeon: FKerin Salen MD;  Location[facility]  Service: Orthopedics;  Laterality: Left;  DEPUY PINNACLE    FAMILY HISTORY Family History  Problem Relation Age of Onset  . Dementia Mother   . Cancer Mother     Breast and lung cancer  . Stroke Sister   . Breast cancer Sister 537 . Heart attack Maternal Aunt   . Lung cancer Maternal Grandmother     non smoker  . Glaucoma Maternal Grandfather   . Anesthesia problems Neg Hx   The patient's father died at age 70 the patient's mother died at age 70 She had breast and lung cancers diagnosed shortly before her death. The patient had no brothers, 2 sisters. One sister was diagnosed with breast cancer at the age of 587  GYNECOLOGIC HISTORY:  No LMP recorded. Patient has had a hysterectomy. Menarche age 70 first live birth age 89 the patient is GX P1. She had a hysterectomy for endometrial cancer in the year 2000. She did not  take hormone replacement. She did use oral contraceptives for your than 20 years remotely, with no complications.  SOCIAL HISTORY:  BVyletis retired. She is home with her husband TMarcello Obrien Their son CJuanda [place] in [place].    ADVANCED DIRECTIVES: In place  HEALTH MAINTENANCE: Social History  Substance Use Topics  . Smoking status: Never Smoker  . Smokeless tobacco: Never Used  . Alcohol use No     Colonoscopy: Never  PAP: Status post hysterectomy  Bone density: Remote   Allergies  Allergen Reactions  . Lipitor [Atorvastatin Calcium]     Leg cramp  . Oxycodone Nausea And Vomiting    Patient vomited for 3 days after taking  . Sitagliptin Phosphate Nausea And Vomiting  . Sulfa Drugs Cross Reactors Nausea And Vomiting    Current Outpatient Prescriptions  Medication Sig Dispense Refill  . aspirin 81 MG tablet Take 81 mg by mouth daily. Reported on 06/12/2015    . bimatoprost (LUMIGAN) 0.03 % ophthalmic solution Place 1 drop into both eyes at bedtime.    . Brimonidine Tartrate-Timolol (COMBIGAN OP) Place 1 drop  into both eyes 2 (two) times daily.     Marland Kitchen letrozole (FEMARA) 2.5 MG tablet Take 1 tablet (2.5 mg total) by mouth daily. 90 tablet 4  . lisinopril-hydrochlorothiazide (PRINZIDE,ZESTORETIC) 20-12.5 MG tablet TAKE ONE TABLET BY MOUTH EVERY DAY    . lovastatin (MEVACOR) 40 MG tablet Take 1 tablet (40 mg total) by mouth at bedtime. 90 tablet 3  . pioglitazone-metformin (ACTOPLUS MET) 15-500 MG tablet TAKE ONE TABLET BY MOUTH ONCE DAILY 90 tablet 2  . prednisoLONE acetate (PRED FORTE) 1 % ophthalmic suspension 1 drop 4 (four) times daily.     No current facility-administered medications for this visit.     OBJECTIVE: Middle-aged white woman In no acute distress Vitals:   03/15/16 1624  BP: 140/63  Pulse: 63  Resp: 18  Temp: 98.1 F (36.7 C)     Body mass index is 41.75 kg/m.    ECOG FS:1 - Symptomatic but completely ambulatory  Sclerae  unicteric, pupils round and equal Oropharynx clear and moist-- no thrush or other lesions No cervical or supraclavicular adenopathy Lungs no rales or rhonchi Heart regular rate and rhythm Abd soft, obese, nontender, positive bowel sounds MSK no focal spinal tenderness, no upper extremity lymphedema Neuro: nonfocal, well oriented, appropriate affect Breasts: Deferred   LAB RESULTS:  CMP     Component Value Date/Time   NA 141 02/03/2016 0813   K 4.0 02/03/2016 0813   CL 106 04/29/2015 1730   CO2 26 02/03/2016 0813   GLUCOSE 200 (H) 02/03/2016 0813   BUN 10.2 02/03/2016 0813   CREATININE 0.8 02/03/2016 0813   CALCIUM 9.1 02/03/2016 0813   PROT 7.1 02/03/2016 0813   ALBUMIN 3.6 02/03/2016 0813   AST 12 02/03/2016 0813   ALT 13 02/03/2016 0813   ALKPHOS 54 02/03/2016 0813   BILITOT 0.48 02/03/2016 0813   GFRNONAA >90 11/13/2012 0430   GFRAA >90 11/13/2012 0430    INo results found for: SPEP, UPEP  Lab Results  Component Value Date   WBC 7.9 02/03/2016   NEUTROABS 5.1 02/03/2016   HGB 12.0 02/03/2016   HCT 36.0 02/03/2016   MCV 82.6 02/03/2016   PLT 187 02/03/2016      Chemistry      Component Value Date/Time   NA 141 02/03/2016 0813   K 4.0 02/03/2016 0813   CL 106 04/29/2015 1730   CO2 26 02/03/2016 0813   BUN 10.2 02/03/2016 0813   CREATININE 0.8 02/03/2016 0813      Component Value Date/Time   CALCIUM 9.1 02/03/2016 0813   ALKPHOS 54 02/03/2016 0813   AST 12 02/03/2016 0813   ALT 13 02/03/2016 0813   BILITOT 0.48 02/03/2016 0813       No results found for: LABCA2  No components found for: LABCA125  No results for input(s): INR in the last 168 hours.  Urinalysis    Component Value Date/Time   COLORURINE YELLOW 10/30/2014 1513   APPEARANCEUR CLEAR 10/30/2014 1513   LABSPEC 1.010 10/30/2014 1513   PHURINE 7.0 10/30/2014 1513   GLUCOSEU NEGATIVE 10/30/2014 1513   HGBUR NEGATIVE 10/30/2014 1513   BILIRUBINUR NEGATIVE 10/30/2014 1513    KETONESUR NEGATIVE 10/30/2014 1513   PROTEINUR NEGATIVE 11/08/2012 1319   UROBILINOGEN 0.2 10/30/2014 1513   NITRITE NEGATIVE 10/30/2014 1513   LEUKOCYTESUR NEGATIVE 10/30/2014 1513     STUDIES: Ct Chest W Contrast  Result Date: 03/10/2016 CLINICAL DATA:  70 year old female with history of right-sided breast cancer diagnosed in August 2017. Additional  history of uterine cancer diagnosed in 2000. EXAM: CT CHEST, ABDOMEN, AND PELVIS WITH CONTRAST TECHNIQUE: Multidetector CT imaging of the chest, abdomen and pelvis was performed following the standard protocol during bolus administration of intravenous contrast. CONTRAST:  122m ISOVUE-300 IOPAMIDOL (ISOVUE-300) INJECTION 61% COMPARISON:  None. FINDINGS: CT CHEST FINDINGS Cardiovascular: Heart size is normal. There is no significant pericardial fluid, thickening or pericardial calcification. There is aortic atherosclerosis, as well as atherosclerosis of the great vessels of the mediastinum and the coronary arteries, including calcified atherosclerotic plaque in the left anterior descending and left circumflex coronary arteries. Mediastinum/Nodes: Multiple with borderline enlarged and mildly enlarged mediastinal and bilateral hilar lymph nodes measuring up to 16 mm in short axis in the subcarinal nodal station. Esophagus is unremarkable in appearance. Multiple prominent borderline enlarged right axillary lymph nodes measuring up to 8 mm in short axis. No left axillary lymphadenopathy. Lungs/Pleura: There are innumerable pulmonary nodules scattered throughout the lungs bilaterally, predominantly in a perilymphatic distribution, highly concerning for widespread lymphangitic spread of disease. The largest pulmonary nodules include a subpleural 7 x 11 mm nodule in the anterior aspect of the right lower lobe (image 71 of series 5), and a subpleural 11 x 7 mm nodule in the inferior aspect of the left lower lobe (image 98 of series 5). Extensive pleural thickening  and nodularity is noted bilaterally (right greater than left). No pleural effusions. Musculoskeletal: In the superior aspect of the right breast there are several enhancing soft tissue nodules, the largest of which measures 3.0 x 1.5 x 1.7 cm (axial image 20 of series 2 and coronal image 56 of series 602). There are no aggressive appearing lytic or blastic lesions noted in the visualized portions of the skeleton. CT ABDOMEN PELVIS FINDINGS Hepatobiliary: Diffuse low attenuation throughout the hepatic parenchyma, compatible with a background of hepatic steatosis. No cystic or solid hepatic lesions. Status post cholecystectomy. Mild intrahepatic and moderate extrahepatic biliary ductal dilatation, favored to reflect benign post cholecystectomy physiology. No definite calcified stone identified in the common bile duct. Pancreas: No pancreatic mass. No pancreatic ductal dilatation. No pancreatic or peripancreatic fluid or inflammatory changes. Spleen: Unremarkable. Adrenals/Urinary Tract: In the upper pole of the right kidney there is a 2.1 cm intermediate attenuation (30 HU) lesion which is otherwise simple in appearance. Left kidney and right adrenal gland are normal in appearance. In the lateral limb of the left adrenal gland there is a 1 cm indeterminate nodule. No hydroureteronephrosis. Urinary bladder is partially obscured by extensive beam hardening artifact from the patient's bilateral hip arthroplasties, but the visualized portions of the urinary bladder are unremarkable in appearance. Stomach/Bowel: Normal appearance of the stomach. There is no pathologic dilatation of small bowel or colon. Numerous colonic diverticulae are noted, without surrounding inflammatory changes to suggest an acute diverticulitis at this time. Normal appendix. Vascular/Lymphatic: Aortic atherosclerosis, without evidence of aneurysm or dissection in the abdominal or pelvic vasculature. No lymphadenopathy noted in the abdomen or pelvis.  Reproductive: Status post hysterectomy. Ovaries are not confidently identified may be surgically absent or atrophic. Other: Small umbilical hernia containing only omental fat. Small epigastric ventral hernia containing only omental fat. No significant volume of ascites. No pneumoperitoneum. Musculoskeletal: There are no aggressive appearing lytic or blastic lesions noted in the visualized portions of the skeleton. Postoperative changes of bilateral hip arthroplasty are noted. IMPRESSION: 1. 3.0 x 1.5 x 1.7 cm enhancing soft tissue nodule in the superior aspect of the right breast with several smaller surrounding nodules in the  right breast and multiple borderline enlarged right axillary lymph nodes. In addition, there is evidence for widespread lymphangitic spread of disease throughout the lungs bilaterally, pleural involvement bilaterally, and metastatic lymphadenopathy in the mediastinal and hilar nodal stations bilaterally, as detailed above. 2. No definite evidence of infradiaphragmatic metastatic disease in the abdomen or pelvis. 3. 2.1 cm intermediate attenuation lesion in the upper pole of the right kidney is indeterminate. Statistically, this is likely to represent a small proteinaceous cyst. The possibility of a cystic neoplasm such as a papillary renal cell carcinoma is not excluded, however, and close attention on followup studies is recommended. 4. Hepatic steatosis. 5. Aortic atherosclerosis, in addition to 2 vessel coronary artery disease. Assessment for potential risk factor modification, dietary therapy or pharmacologic therapy may be warranted, if clinically indicated. 6. Small epigastric ventral hernia and small umbilical hernia as a, both of which contain only omental fat. No findings to suggest associated bowel incarceration or obstruction. 7. Colonic diverticulosis without evidence of acute diverticulitis at this time. 8. Additional incidental findings, as above. Electronically Signed   By:  Vinnie Langton M.D.   On: 03/10/2016 09:06   Ct Abdomen Pelvis W Contrast  Result Date: 03/10/2016 CLINICAL DATA:  70 year old female with history of right-sided breast cancer diagnosed in August 2017. Additional history of uterine cancer diagnosed in 2000. EXAM: CT CHEST, ABDOMEN, AND PELVIS WITH CONTRAST TECHNIQUE: Multidetector CT imaging of the chest, abdomen and pelvis was performed following the standard protocol during bolus administration of intravenous contrast. CONTRAST:  181m ISOVUE-300 IOPAMIDOL (ISOVUE-300) INJECTION 61% COMPARISON:  None. FINDINGS: CT CHEST FINDINGS Cardiovascular: Heart size is normal. There is no significant pericardial fluid, thickening or pericardial calcification. There is aortic atherosclerosis, as well as atherosclerosis of the great vessels of the mediastinum and the coronary arteries, including calcified atherosclerotic plaque in the left anterior descending and left circumflex coronary arteries. Mediastinum/Nodes: Multiple with borderline enlarged and mildly enlarged mediastinal and bilateral hilar lymph nodes measuring up to 16 mm in short axis in the subcarinal nodal station. Esophagus is unremarkable in appearance. Multiple prominent borderline enlarged right axillary lymph nodes measuring up to 8 mm in short axis. No left axillary lymphadenopathy. Lungs/Pleura: There are innumerable pulmonary nodules scattered throughout the lungs bilaterally, predominantly in a perilymphatic distribution, highly concerning for widespread lymphangitic spread of disease. The largest pulmonary nodules include a subpleural 7 x 11 mm nodule in the anterior aspect of the right lower lobe (image 71 of series 5), and a subpleural 11 x 7 mm nodule in the inferior aspect of the left lower lobe (image 98 of series 5). Extensive pleural thickening and nodularity is noted bilaterally (right greater than left). No pleural effusions. Musculoskeletal: In the superior aspect of the right breast  there are several enhancing soft tissue nodules, the largest of which measures 3.0 x 1.5 x 1.7 cm (axial image 20 of series 2 and coronal image 56 of series 602). There are no aggressive appearing lytic or blastic lesions noted in the visualized portions of the skeleton. CT ABDOMEN PELVIS FINDINGS Hepatobiliary: Diffuse low attenuation throughout the hepatic parenchyma, compatible with a background of hepatic steatosis. No cystic or solid hepatic lesions. Status post cholecystectomy. Mild intrahepatic and moderate extrahepatic biliary ductal dilatation, favored to reflect benign post cholecystectomy physiology. No definite calcified stone identified in the common bile duct. Pancreas: No pancreatic mass. No pancreatic ductal dilatation. No pancreatic or peripancreatic fluid or inflammatory changes. Spleen: Unremarkable. Adrenals/Urinary Tract: In the upper pole of the right  kidney there is a 2.1 cm intermediate attenuation (30 HU) lesion which is otherwise simple in appearance. Left kidney and right adrenal gland are normal in appearance. In the lateral limb of the left adrenal gland there is a 1 cm indeterminate nodule. No hydroureteronephrosis. Urinary bladder is partially obscured by extensive beam hardening artifact from the patient's bilateral hip arthroplasties, but the visualized portions of the urinary bladder are unremarkable in appearance. Stomach/Bowel: Normal appearance of the stomach. There is no pathologic dilatation of small bowel or colon. Numerous colonic diverticulae are noted, without surrounding inflammatory changes to suggest an acute diverticulitis at this time. Normal appendix. Vascular/Lymphatic: Aortic atherosclerosis, without evidence of aneurysm or dissection in the abdominal or pelvic vasculature. No lymphadenopathy noted in the abdomen or pelvis. Reproductive: Status post hysterectomy. Ovaries are not confidently identified may be surgically absent or atrophic. Other: Small umbilical  hernia containing only omental fat. Small epigastric ventral hernia containing only omental fat. No significant volume of ascites. No pneumoperitoneum. Musculoskeletal: There are no aggressive appearing lytic or blastic lesions noted in the visualized portions of the skeleton. Postoperative changes of bilateral hip arthroplasty are noted. IMPRESSION: 1. 3.0 x 1.5 x 1.7 cm enhancing soft tissue nodule in the superior aspect of the right breast with several smaller surrounding nodules in the right breast and multiple borderline enlarged right axillary lymph nodes. In addition, there is evidence for widespread lymphangitic spread of disease throughout the lungs bilaterally, pleural involvement bilaterally, and metastatic lymphadenopathy in the mediastinal and hilar nodal stations bilaterally, as detailed above. 2. No definite evidence of infradiaphragmatic metastatic disease in the abdomen or pelvis. 3. 2.1 cm intermediate attenuation lesion in the upper pole of the right kidney is indeterminate. Statistically, this is likely to represent a small proteinaceous cyst. The possibility of a cystic neoplasm such as a papillary renal cell carcinoma is not excluded, however, and close attention on followup studies is recommended. 4. Hepatic steatosis. 5. Aortic atherosclerosis, in addition to 2 vessel coronary artery disease. Assessment for potential risk factor modification, dietary therapy or pharmacologic therapy may be warranted, if clinically indicated. 6. Small epigastric ventral hernia and small umbilical hernia as a, both of which contain only omental fat. No findings to suggest associated bowel incarceration or obstruction. 7. Colonic diverticulosis without evidence of acute diverticulitis at this time. 8. Additional incidental findings, as above. Electronically Signed   By: Vinnie Langton M.D.   On: 03/10/2016 09:06   Mr Breast Bilateral W Wo Contrast  Result Date: 02/22/2016 CLINICAL DATA:  70 year old female  with biopsy proven grade I-II invasive ductal carcinoma in the 12 o'clock region of the right breast 10 cm from the nipple and in the 4 o'clock region of the breast. LABS:  None obtained on site at the time of imaging. EXAM: BILATERAL BREAST MRI WITH AND WITHOUT CONTRAST TECHNIQUE: Multiplanar, multisequence MR images of both breasts were obtained prior to and following the intravenous administration of 20 ml of MultiHance. THREE-DIMENSIONAL MR IMAGE RENDERING ON INDEPENDENT WORKSTATION: Three-dimensional MR images were rendered by post-processing of the original MR data on an independent workstation. The three-dimensional MR images were interpreted, and findings are reported in the following complete MRI report for this study. Three dimensional images were evaluated at the independent Dyn[employer] workstation COMPARISON:  Previous exam(s). FINDINGS: Breast composition: b. Scattered fibroglandular tissue. Background parenchymal enhancement: Moderate. Right breast: There are numerous enhancing masses in the upper aspect of the right breast. The dominant mass in the 12 o'clock region of the  breast measures 2.2 x 1.7 x 1.9 cm. It is associated with a signal void artifact corresponding with the biopsy clip. Numerous other enhancing smaller masses are seen extending anteriorly from the dominant mass. The multiple enhancing masses span an area of 10.7 x 8.0 x 2.2 cm in anterior posterior, transverse and longitudinal dimensions. There is non mass like enhancement in the lower-inner quadrant of the right breast associated with a signal void artifact from the biopsy clip. The non masslike enhancement measures 2.1 x 0.9 x 1.1 cm corresponding with biopsy proven invasive ductal carcinoma in the 4 o'clock region of the breast. Left breast: No mass or abnormal enhancement. Lymph nodes: There are several prominent right level I axillary lymph nodes. There is a 1.7 cm lymph node with a 9 mm cortex. It is suspicious for metastatic  involvement. Ancillary findings:  None. IMPRESSION: Numerous enhancing masses in the superior aspect of the right breast and non masslike enhancement in the 4 o'clock region of the right breast consistent with multifocal disease. Suspicious right axillary adenopathy. RECOMMENDATION: Treatment planning of the known right breast cancers is recommended. If clinically indicated second-look ultrasound for abnormal right axillary adenopathy and possible biopsy could be performed. BI-RADS CATEGORY  6: Known biopsy-proven malignancy. Electronically Signed   By: Lillia Mountain M.D.   On: 02/22/2016 11:21   [place] Extrem Up Right Ltd  Result Date: 03/02/2016 CLINICAL DATA:  Recent diagnosis of right breast invasive ductal carcinoma at 2 sites in the right breast, at the 12 o'clock and 4 o'clock locations. On the breast MRI performed 02/21/2016, there were several prominent level 1 axillary lymph nodes, with thickened cortices. Second-look ultrasound is requested, to assess if ultrasound-guided biopsy can be performed of any of the potentially suspicious lymph nodes. EXAM: ULTRASOUND RIGHT UPPER EXTREMITY LIMITED TECHNIQUE: Ultrasound examination of the upper extremity soft tissues was performed in the area of clinical concern. COMPARISON:  Recent bilateral mammogram, right breast ultrasound, right breast biopsies, and breast MRI. The breast MRI was performed 02/21/2016. FINDINGS: There are several level 1 right axillary lymph nodes with focal areas of cortical thickening. Cortical thickening measures from 5 to 7 mm. At least 3 lymph nodes with cortical thickening are identified on ultrasound. IMPRESSION: Suspicious right axillary lymph nodes. Ultrasound-guided biopsy will be performed of one of the lymph nodes today and dictated separately. Electronically Signed   By: Curlene Dolphin M.D.   On: 03/02/2016 15:30   [place] Rt Breast Bx W Loc Dev 1st Lesion Img Bx Spec US Guide  Addendum Date: 03/07/2016   ADDENDUM REPORT: 03/03/2016  15:42 ADDENDUM: Pathology revealed LYMPH NODE TISSUE WITH METASTATIC CARCINOMA of the Right axilla. This was found to be concordant by Dr. Curlene Dolphin. Pathology results were discussed with the patient by telephone. The patient reported doing well after the biopsy with tenderness at the site. Post biopsy instructions and care were reviewed and questions were answered. The patient was encouraged to call [facility] for any additional concerns. The patient has a recent diagnosis of right breast cancer and should follow her outlined treatment plan. Pathology results reported by Terie Purser, RN on 03/03/2016. Electronically Signed   By: Curlene Dolphin M.D.   On: 03/03/2016 15:42   Result Date: 03/07/2016 CLINICAL DATA:  Suspicious right axillary lymph nodes in patient with recently diagnosed invasive ductal carcinoma in 2 separate quadrants of the right breast. EXAM: ULTRASOUND GUIDED CORE NEEDLE BIOPSY OF A RIGHT AXILLARY NODE COMPARISON:  Previous exam(s).  FINDINGS: I met with the patient and we discussed the procedure of ultrasound-guided biopsy, including benefits and alternatives. We discussed the high likelihood of a successful procedure. We discussed the risks of the procedure, including infection, bleeding, tissue injury, clip migration, and inadequate sampling. Informed written consent was given. The usual time-out protocol was performed immediately prior to the procedure. Using sterile technique and 1% Lidocaine as local anesthetic, under direct ultrasound visualization, a 14 gauge spring-loaded device was used to perform biopsy of a level 1 axillary lymph node with cortical thickening using a lateral to medial approach. At the conclusion of the procedure a HydroMARK tissue marker clip was deployed into the biopsy cavity. The [facility] tissue marker clip is visible on the ultrasound images. IMPRESSION: Ultrasound guided biopsy of right axillary lymph node. No apparent  complications. Electronically Signed: By: Curlene Dolphin M.D. On: 03/02/2016 15:31    ELIGIBLE FOR AVAILABLE RESEARCH PROTOCOL: no  ASSESSMENT: 70 y.o. [place] woman with a remote history of early stage endometrial cancer, now status post right breast upper outer quadrant biopsy 01/22/2016 for a clinically multifocal T2 N0, stage 2A invasive ductal carcinoma, grade 1, estrogen and progesterone receptor positive, HER-2 negative, with an MIB-1 between 10 and 15%.  (1) right axillary lymph node biopsy 03/02/2016 positive  (2) genetics testing 01/13/2016 through the Custom gene panel offered by [facility] found no deleterious mutations in  ATM, BARD1, BRCA1, BRCA2, BRIP1, CDH1, CHEK2, EPCAM, FANCC, MLH1, MSH2, MSH6, MUTYH, NBN, PALB2, PMS2, POLD1, PTEN, RAD51C, RAD51D, TP53, and XRCC2  METASTATIC DISEASE: (3) CT scans of the chest abdomen and pelvis obtained 03/10/2016 are consistent with bilateral lung metastases and mediastinal and hilar nodal involvement, but no liver or bone spread  (4) letrozole started 03/15/2016, palbociclib to follow  PLAN: I spent approximately an hour today with Myosha and her husband going over her situation. What we do know is that she has stage IIB right-sided breast cancer, which is estrogen receptor and progesterone receptor positive but HER-2 not amplified.  What we suspect is that this cancer has spread to both her lungs including the lung parenchyma, the lining of the lungs, the area between the lungs, and the roots of both lungs. I showed her all the relevant scans.  She understands that while this looks very much like cancer, it could be an inflammatory or other condition. Even if what is in the long is cancer, it could be a different cancer, for example lymphoma.  Accordingly the abnormalities in the lung will have to be biopsied and I'm referring her to a pulmonologist to get that done as soon as possible.  In the meantime however I am starting her on  breast cancer treatment. She understands that stage IV breast cancer is not curable. The goal is control. She also understands that postponing her right breast surgery will not affect the ultimate results. It is very safe for her to wait on her surgery until we know what's going on and the rest of her body and can have a definitive treatment plan.  She will start letrozole today. We discussed the possible toxicities, side effects and complications of this agent and sometime before the year we will do a bone density scan.  We are also starting possible palbociclib. We will have to obtain this for her through our oral pharmacy support services. I put the order in today and hopefully within a week she will be able to start this.  She understands that I'll palbociclib, lower her  white count and she will need to have her complete blood count checked initially on a weekly basis, later only on a monthly basis. We also discussed the other possible side effects toxicities and complications of this agent which include easy bruising and fatigue among others.  Tentatively I am scheduling her to return to see me the first weekend in November. We should have a definitive diagnosis of what ever is in her lungs by then. She knows to call for any problems that may develop before that visit.  Chauncey Cruel, MD   03/15/2016 5:23 PM Medical Oncology and Hematology [facility] [address] [place], [place] [address] Tel. [phone]    Fax. [phone]

## 2016-03-21 ENCOUNTER — Encounter: Payer: Self-pay | Admitting: Pharmacist

## 2016-03-21 NOTE — Progress Notes (Signed)
Oral Chemotherapy Pharmacist Encounter  Received notification from [facility] that Ibrance 125mg  prescription would require prior authorization. We did not receive original prescription.  Labs from 02/03/16 reviewed, appropriate for treatment.  Current medication list in Epic assessed, no significant DDIs with Ibrance identified. Minor DDI with lovastatin, palbociclib may increase the serum concentration of CYP3A4 Substrates (High risk with Inhibitors). This will be monitored.  Prior authorization initiated on [email or web address] Key # [id number]  PA is pending. Oral Chemo Clinic will continue to follow.  Johny Drilling, PharmD, BCPS 03/21/2016  4:53 PM Oral Chemotherapy Clinic [phone]

## 2016-03-22 ENCOUNTER — Encounter: Payer: Self-pay | Admitting: Pulmonary Disease

## 2016-03-22 ENCOUNTER — Ambulatory Visit (INDEPENDENT_AMBULATORY_CARE_PROVIDER_SITE_OTHER): Payer: Medicare Other | Admitting: Pulmonary Disease

## 2016-03-22 ENCOUNTER — Telehealth: Payer: Self-pay | Admitting: Pharmacist

## 2016-03-22 VITALS — BP 148/88 | HR 63 | Ht 64.0 in | Wt 242.0 lb

## 2016-03-22 DIAGNOSIS — R918 Other nonspecific abnormal finding of lung field: Secondary | ICD-10-CM | POA: Diagnosis not present

## 2016-03-22 DIAGNOSIS — G4733 Obstructive sleep apnea (adult) (pediatric): Secondary | ICD-10-CM | POA: Diagnosis not present

## 2016-03-22 DIAGNOSIS — C7801 Secondary malignant neoplasm of right lung: Secondary | ICD-10-CM

## 2016-03-22 NOTE — Patient Instructions (Signed)
It was a pleasure taking care of you today!  You are diagnosed with Lung Nodules.   We will schedule you for a chest CT scan with contrast in 3 months.  I will call you after I discuss the case with my colleague regarding the bronchoscopy.  Return to clinic in 3 months after the chest CT scan.

## 2016-03-22 NOTE — Assessment & Plan Note (Signed)
Patient has obstructive sleep apnea diagnosed several years ago. She was on CPAP therapy but was not compliant. May need to discuss with her CPAP therapy if she she will need a biopsy/bronchoscopy.

## 2016-03-22 NOTE — Assessment & Plan Note (Signed)
Weight reduction 

## 2016-03-22 NOTE — Progress Notes (Signed)
Subjective:    Patient ID: [name], female    DOB: September 25, 1945, 70 y.o.   MRN: [id number]  HPI   This is the case of [name], 70 y.o. Female, who was referred by Dr. Lurline Del  in consultation regarding Breast Ca with lung metastasis.   As you very well know, patient is a non smoker, not been diagnosed with any lung problems. Patient was recently diagnosed with Breast CA in 02/2016, R breast.  Currently on chemotherapy.  Patient currently sees you in based on your note, she has clinically multifocal T2 No, stage 2A invasive ductal carcinoma, grade 1, estrogen and progesterone receptor positive, HER-2 negative. You  started her on chemotherapy.  As part of the workup for the malignancy, you ended up getting a chest and abdominal CT scan. In the chest CT scan, there was a 3 x 1.5 x 1.7 enhancing soft tissue nodule in the superior aspect of the right breast. She also had innumerable pulmonary nodules scattered throughout the lungs bilaterally, predominantly in a perilymphatic distribution, concerning for widespread lymphangitic spread. Nodules were sub-centimeters.  Patient also had multiple with borderline enlarged and mildly enlarged mediastinal and bilateral hilar lymph nodes measuring up to 16 mm. No other lung infiltrates or masses were seen.  Patient is here for her abnormal lung CT scan.  Patient was diagnosed with sleep apnea years ago. She was on CPAP therapy. She stopped it since it was not making a difference.   Review of Systems  Constitutional: Negative.  Negative for fever and unexpected weight change.  HENT: Negative for congestion, dental problem, ear pain, nosebleeds, postnasal drip, rhinorrhea, sinus pressure, sneezing, sore throat and trouble swallowing.   Eyes: Negative.  Negative for redness and itching.  Respiratory: Positive for cough. Negative for chest tightness, shortness of breath and wheezing.   Cardiovascular: Negative.  Negative for palpitations  and leg swelling.  Gastrointestinal: Negative.  Negative for nausea and vomiting.  Endocrine: Negative.   Genitourinary: Negative.  Negative for dysuria.  Musculoskeletal: Negative.  Negative for joint swelling.  Skin: Negative.  Negative for rash.  Allergic/Immunologic: Positive for environmental allergies.  Neurological: Positive for headaches.  Hematological: Negative.  Does not bruise/bleed easily.  Psychiatric/Behavioral: Negative.  Negative for dysphoric mood. The patient is not nervous/anxious.    Past Medical History:  Diagnosis Date  . Cancer ([place]) 02/2016   right breast  . DIABETES MELLITUS, TYPE II 01/04/2007   only takes actoplus daily  . Dizziness and giddiness 02/29/2008  . DVT, HX OF    at age 52 in right buttocks  . Family history of breast cancer   . GERD 01/04/2007   pt reports resolved   . GLAUCOMA 07/30/2008   both eyes  . History of blood transfusion    no abnormal  reaction  . History of uterine cancer 2000   hysterectomy done  . HYPERLIPIDEMIA 01/04/2007   taking Pravastatin daily  . HYPERTENSION 01/04/2007   takes Lisinopril daily  . Joint pain   . Joint swelling   . LEG PAIN, LEFT 07/06/2007  . NUMBNESS 07/30/2008   in fingers;pt states from [facility]  . OSTEOARTHRITIS, HIP 09/25/2009  . OTITIS MEDIA, ACUTE, BILATERAL 02/29/2008  . Overweight(278.02) 01/04/2007  . PONV (postoperative nausea and vomiting)   . SLEEP APNEA, OBSTRUCTIVE    doesn't use a cpap;study done about 74yr ago  . TRANSIENT ISCHEMIC ATTACK, HX OF 01/04/2007  . Vision loss    left eye  Had uterine CA in 2000 > had hysterectomy.   Family History  Problem Relation Age of Onset  . Dementia Mother   . Cancer Mother     Breast and lung cancer  . Stroke Sister   . Breast cancer Sister 27  . Heart attack Maternal Aunt   . Lung cancer Maternal Grandmother     non smoker  . Glaucoma Maternal Grandfather   . Anesthesia problems Neg Hx      Past Surgical History:  Procedure  Laterality Date  . ABDOMINAL HYSTERECTOMY  2000  . CHOLECYSTECTOMY    . EYE SURGERY  13   shunt left and lazer eye surgery on right cataract and retenia tear with repair  . growth removal  2004   from thumb  . KNEE ARTHROSCOPY Right   . mulitple eye surgeries     both eyes, cataracts with ioc done both eyes  . OOPHORECTOMY    . TOTAL HIP ARTHROPLASTY  06/24/2011   Procedure: TOTAL HIP ARTHROPLASTY;  Surgeon: Kerin Salen;  Location: [facility];  Service: Orthopedics;  Laterality: Right;  . TOTAL HIP ARTHROPLASTY Left 11/12/2012   Dr Mayer Camel  . TOTAL HIP ARTHROPLASTY Left 11/12/2012   Procedure: TOTAL HIP ARTHROPLASTY;  Surgeon: Kerin Salen, MD;  Location: [facility];  Service: Orthopedics;  Laterality: Left;  DEPUY PINNACLE    Social History   Social History  . Marital status: Married    Spouse name: N/A  . Number of children: N/A  . Years of education: N/A   Occupational History  . office administrator [employer]   Social History Main Topics  . Smoking status: Never Smoker  . Smokeless tobacco: Never Used  . Alcohol use No  . Drug use: No  . Sexual activity: Not Currently   Other Topics Concern  . Not on file   Social History Narrative  . No narrative on file   Lives in [place].   Allergies  Allergen Reactions  . Atorvastatin Other (See Comments)    Leg cramp  . Fluorescein Nausea And Vomiting    ? IV dye for retina specialist  . Lipitor [Atorvastatin Calcium]     Leg cramp  . Oxycodone Nausea And Vomiting    Patient vomited for 3 days after taking  . Sitagliptin Phosphate Nausea And Vomiting  . Sulfa Drugs Cross Reactors Nausea And Vomiting     Outpatient Medications Prior to Visit  Medication Sig Dispense Refill  . aspirin 81 MG tablet Take 81 mg by mouth daily. Reported on 06/12/2015    . letrozole (FEMARA) 2.5 MG tablet Take 1 tablet (2.5 mg total) by mouth daily. (Patient taking differently: Take 2.5 mg by mouth at bedtime. ) 90 tablet 4  .  lisinopril-hydrochlorothiazide (PRINZIDE,ZESTORETIC) 20-12.5 MG tablet TAKE ONE TABLET BY MOUTH EVERY DAY    . prednisoLONE acetate (PRED FORTE) 1 % ophthalmic suspension 1 drop every morning.     . Brimonidine Tartrate-Timolol (COMBIGAN OP) Place 1 drop into both eyes 2 (two) times daily.     . pioglitazone-metformin (ACTOPLUS MET) 15-500 MG tablet TAKE ONE TABLET BY MOUTH ONCE DAILY 90 tablet 2  . palbociclib (IBRANCE) 125 MG capsule Take 1 capsule (125 mg total) by mouth daily with breakfast. Take whole with food. (Patient not taking: Reported on 03/23/2016) 21 capsule 6  . bimatoprost (LUMIGAN) 0.03 % ophthalmic solution Place 1 drop into both eyes at bedtime.    . lovastatin (MEVACOR) 40 MG tablet Take  1 tablet (40 mg total) by mouth at bedtime. 90 tablet 3   No facility-administered medications prior to visit.    Meds ordered this encounter  Medications  . bimatoprost (LUMIGAN) 0.01 % SOLN    Sig: 1 drop nightly.  . brimonidine-timolol (COMBIGAN) 0.2-0.5 % ophthalmic solution    Sig: Place 1 drop into both eyes tid  . pioglitazone-metformin (ACTOPLUS MET) 15-500 MG tablet    Sig: Take 15-500 tablets by mouth daily.  Marland Kitchen lovastatin (MEVACOR) 20 MG tablet    Sig: Take 20 mg by mouth every evening.          Objective:   Physical Exam   Vitals:  Vitals:   03/22/16 0936  BP: (!) 148/88  Pulse: 63  SpO2: 93%  Weight: 242 lb (109.8 kg)  Height: '5\' 4"'$  (1.626 m)    Constitutional/General:  Pleasant, well-nourished, well-developed, not in any distress,  Comfortably seating.  Well kempt  Body mass index is 41.54 kg/m. Wt Readings from Last 3 Encounters:  03/22/16 242 lb (109.8 kg)  03/15/16 243 lb 3.2 oz (110.3 kg)  02/03/16 247 lb 11.2 oz (112.4 kg)     HEENT: Pupils equal and reactive to light and accommodation. Anicteric sclerae. Normal nasal mucosa.   No oral  lesions,  mouth clear,  oropharynx clear, no postnasal drip. (-) Oral thrush. No dental caries.  Airway -  Mallampati class III  Neck: No masses. Midline trachea. No JVD, (-) LAD. (-) bruits appreciated.  Respiratory/Chest: Grossly normal chest. (-) deformity. (-) Accessory muscle use.  Symmetric expansion. (-) Tenderness on palpation.  Resonant on percussion.  Diminished BS on both lower lung zones. (-) wheezing, crackles, rhonchi (-) egophony  Cardiovascular: Regular rate and  rhythm, heart sounds normal, no murmur or gallops, no peripheral edema  Gastrointestinal:  Normal bowel sounds. Soft, non-tender. No hepatosplenomegaly.  (-) masses.   Musculoskeletal:  Normal muscle tone. Normal gait.   Extremities: Grossly normal. (-) clubbing, cyanosis.  (-) edema  Skin: (-) rash,lesions seen.   Neurological/Psychiatric : alert, oriented to time, place, person. Normal mood and affect         Assessment & Plan:  Pulmonary nodules Patient was recently diagnosed with Breast CA in 02/2016, R breast.  Currently on chemotherapy.  Patient currently sees you in based on your note, she has clinically multifocal T2 No, stage 2A invasive ductal carcinoma, grade 1, estrogen and progesterone receptor positive, HER-2 negative. You  started her on chemotherapy.  As part of the workup for the malignancy, you ended up getting a chest and abdominal CT scan. In the chest CT scan, there was a 3 x 1.5 x 1.7 enhancing soft tissue nodule in the superior aspect of the right breast. She also had innumerable pulmonary nodules scattered throughout the lungs bilaterally, predominantly in a perilymphatic distribution, concerning for widespread lymphangitic spread. Nodules were sub-centimeters.  Patient also had multiple with borderline enlarged and mildly enlarged mediastinal and bilateral hilar lymph nodes measuring up to 16 mm. No other lung infiltrates or masses were seen.  Patient is here for her abnormal lung CT scan.   I personally reviewed previous images (Chest Xray, Chest Ct scan) done on this patient. I  reviewed the reports on the images as well.  I reviewed the images with the patient.  Personally, the nodules are too small and too peripheral to be biopsied. I don't see any airways leading to the nodules. Also, some nodules are towards the pleura. The mediastinal and hilar  lymphadenopathy are not as prominent as well. I will discuss the case with Dr. Lamonte Sakai. He usually does the Bronchoscopy with EBUS and discuss with him whether he will be confident enough to get good samples. As mentioned, the lymph nodes looked small. I will call the patient after my discussion with Dr. Lamonte Sakai.  In the meantime, I will probably be conservative at this point and repeat a chest CT scan with contrast in 3 months. If the nodules are bigger or LAD is bigger, may end up needing bronchoscopy and Biopsy and EBUS. Patient is currently on treatment for her breast cancer.  Patient and husband are in agreement. We'll schedule chest CT scan today.  Addendum 03/23/2016:  Dr. Lamonte Sakai reviewed chest ct scan images. He thinks he can access lymph nodes 7 and 4R via EBUS.  We will set up for Bronchoscopy with Biopsy for this Monday at 7:30am at [facility]. Pt and Dr. Lamonte Sakai aware.   Obstructive sleep apnea Patient has obstructive sleep apnea diagnosed several years ago. She was on CPAP therapy but was not compliant. May need to discuss with her CPAP therapy if she she will need a biopsy/bronchoscopy.   Morbid obesity ([place]) Weight reduction     Thank you very much for letting me participate in this patient's care. Please do not hesitate to give me a call if you have any questions or concerns regarding the treatment plan.   Patient will follow up with me in 3 months.      Monica Becton, MD 03/23/2016   1:23 PM Pulmonary and [place] Pager: [phone] Office: [phone], Fax: [phone]

## 2016-03-22 NOTE — Assessment & Plan Note (Addendum)
Patient was recently diagnosed with Breast CA in 02/2016, R breast.  Currently on chemotherapy.  Patient currently sees you in based on your note, she has clinically multifocal T2 No, stage 2A invasive ductal carcinoma, grade 1, estrogen and progesterone receptor positive, HER-2 negative. You  started her on chemotherapy.  As part of the workup for the malignancy, you ended up getting a chest and abdominal CT scan. In the chest CT scan, there was a 3 x 1.5 x 1.7 enhancing soft tissue nodule in the superior aspect of the right breast. She also had innumerable pulmonary nodules scattered throughout the lungs bilaterally, predominantly in a perilymphatic distribution, concerning for widespread lymphangitic spread. Nodules were sub-centimeters.  Patient also had multiple with borderline enlarged and mildly enlarged mediastinal and bilateral hilar lymph nodes measuring up to 16 mm. No other lung infiltrates or masses were seen.  Patient is here for her abnormal lung CT scan.   I personally reviewed previous images (Chest Xray, Chest Ct scan) done on this patient. I reviewed the reports on the images as well.  I reviewed the images with the patient.  Personally, the nodules are too small and too peripheral to be biopsied. I don't see any airways leading to the nodules. Also, some nodules are towards the pleura. The mediastinal and hilar lymphadenopathy are not as prominent as well. I will discuss the case with Dr. Lamonte Sakai. He usually does the Bronchoscopy with EBUS and discuss with him whether he will be confident enough to get good samples. As mentioned, the lymph nodes looked small. I will call the patient after my discussion with Dr. Lamonte Sakai.  In the meantime, I will probably be conservative at this point and repeat a chest CT scan with contrast in 3 months. If the nodules are bigger or LAD is bigger, may end up needing bronchoscopy and Biopsy and EBUS. Patient is currently on treatment for her breast  cancer.  Patient and husband are in agreement. We'll schedule chest CT scan today.  Addendum 03/23/2016:  Dr. Lamonte Sakai reviewed chest ct scan images. He thinks he can access lymph nodes 7 and 4R via EBUS.  We will set up for Bronchoscopy with Biopsy for this Monday at 7:30am at [facility]. Pt and Dr. Lamonte Sakai aware.

## 2016-03-22 NOTE — Telephone Encounter (Signed)
Oral Chemotherapy Pharmacist Encounter  Received notification from [facility] that [name] prior authorization had been approved. Referral # [id number]  I called [facility] to re-run Rx, copay $2200 D/w patient, gained permission to look for copay assistance  I was successful in enrolling patient in copay assistance through the patient advocate foundation (PAF) Award amount $5000 Award period 09/24/15-03/22/17 Co-pay info:  Fund: breast cancer silo  Cardholder ID: [id number]  [place]: [id number]  PCN: [id number]  Group: [id number]  I informed patient of assistance and communicated copay card info to [facility], they will call patient when [name] is ready for p/u  I spoke with patient for overview of new oral chemotherapy medication: Ibrance. Pt is doing well and will start as soon as she is able to pick up medication.   Counseled patient on administration, dosing, side effects, safe handling, and monitoring. Patient will take her letrozole daily and she will take the Ibrance daily with breakfast for 21 days on and 7 days off. Patient was counseled to avoid grapefruit and grapefruit juice while on treatment with Ibrance.  Side effects include but not limited to: neutropenia, infection, peripheral neuropathy, stomach upset, headache, and fatigue. Pt states that she is already experiencing some headache and fatigue but will let us know if there is a change.  [name] voiced understanding and appreciation.   All questions answered.  Will follow up in 1-2 weeks for adherence and toxicity management.   Thank you,  [name], PharmD, BCPS 03/22/2016  1:50 PM Oral Chemotherapy Clinic [phone]

## 2016-03-23 ENCOUNTER — Encounter (HOSPITAL_COMMUNITY): Payer: Self-pay | Admitting: *Deleted

## 2016-03-23 MED FILL — *IBRANCE 125 MG CAPSULE: 125 | 21 days supply | Qty: 21 | Fill #0

## 2016-03-24 ENCOUNTER — Other Ambulatory Visit: Payer: Self-pay | Admitting: *Deleted

## 2016-03-24 DIAGNOSIS — C50411 Malignant neoplasm of upper-outer quadrant of right female breast: Secondary | ICD-10-CM

## 2016-03-25 ENCOUNTER — Other Ambulatory Visit: Payer: Self-pay | Admitting: Oncology

## 2016-03-27 NOTE — Anesthesia Preprocedure Evaluation (Addendum)
Anesthesia Evaluation  Patient identified by MRN, date of birth, ID band Patient awake  General Assessment Comment:Hx of breast ca , possible lung mets  Reviewed: Allergy & Precautions, NPO status , Patient's Chart, lab work & pertinent test results  History of Anesthesia Complications (+) PONV and history of anesthetic complications  Airway Mallampati: II  TM Distance: >3 FB Neck ROM: Full    Dental  (+) Teeth Intact   Pulmonary sleep apnea ,     + wheezing      Cardiovascular hypertension, + Peripheral Vascular Disease   Rhythm:Regular Rate:Normal     Neuro/Psych TIA   GI/Hepatic GERD  ,  Endo/Other  diabetesMorbid obesity  Renal/GU      Musculoskeletal  (+) Arthritis ,   Abdominal (+) + obese,   Peds  Hematology   Anesthesia Other Findings   Reproductive/Obstetrics                           Anesthesia Physical Anesthesia Plan  ASA: III  Anesthesia Plan: General   Post-op Pain Management:    Induction: Intravenous  Airway Management Planned: Oral ETT  Additional Equipment:   Intra-op Plan:   Post-operative Plan: Extubation in OR and Possible Post-op intubation/ventilation  Informed Consent: I have reviewed the patients History and Physical, chart, labs and discussed the procedure including the risks, benefits and alternatives for the proposed anesthesia with the patient or authorized representative who has indicated his/her understanding and acceptance.   Dental advisory given  Plan Discussed with: CRNA  Anesthesia Plan Comments:         Anesthesia Quick Evaluation

## 2016-03-28 ENCOUNTER — Ambulatory Visit (HOSPITAL_COMMUNITY): Payer: Medicare Other | Admitting: Anesthesiology

## 2016-03-28 ENCOUNTER — Encounter (HOSPITAL_COMMUNITY)
Admission: RE | Disposition: A | Discharge status: Home / Self Care | Payer: Self-pay | Source: Ambulatory Visit | Attending: Emergency Medicine

## 2016-03-28 ENCOUNTER — Ambulatory Visit (HOSPITAL_COMMUNITY)
Admission: RE | Admit: 2016-03-28 | Discharge: 2016-03-28 | Disposition: A | Discharge status: Home / Self Care | Payer: Medicare Other | Source: Ambulatory Visit | Attending: Emergency Medicine | Admitting: Emergency Medicine

## 2016-03-28 ENCOUNTER — Encounter (HOSPITAL_COMMUNITY): Payer: Self-pay | Admitting: *Deleted

## 2016-03-28 DIAGNOSIS — Z6841 Body Mass Index (BMI) 40.0 and over, adult: Secondary | ICD-10-CM | POA: Insufficient documentation

## 2016-03-28 DIAGNOSIS — Z96643 Presence of artificial hip joint, bilateral: Secondary | ICD-10-CM | POA: Insufficient documentation

## 2016-03-28 DIAGNOSIS — Z79899 Other long term (current) drug therapy: Secondary | ICD-10-CM | POA: Diagnosis not present

## 2016-03-28 DIAGNOSIS — Z7982 Long term (current) use of aspirin: Secondary | ICD-10-CM | POA: Diagnosis not present

## 2016-03-28 DIAGNOSIS — E1151 Type 2 diabetes mellitus with diabetic peripheral angiopathy without gangrene: Secondary | ICD-10-CM | POA: Diagnosis not present

## 2016-03-28 DIAGNOSIS — Z8673 Personal history of transient ischemic attack (TIA), and cerebral infarction without residual deficits: Secondary | ICD-10-CM | POA: Insufficient documentation

## 2016-03-28 DIAGNOSIS — R918 Other nonspecific abnormal finding of lung field: Secondary | ICD-10-CM | POA: Diagnosis present

## 2016-03-28 DIAGNOSIS — R590 Localized enlarged lymph nodes: Secondary | ICD-10-CM

## 2016-03-28 DIAGNOSIS — Z803 Family history of malignant neoplasm of breast: Secondary | ICD-10-CM | POA: Diagnosis not present

## 2016-03-28 DIAGNOSIS — K219 Gastro-esophageal reflux disease without esophagitis: Secondary | ICD-10-CM | POA: Diagnosis not present

## 2016-03-28 DIAGNOSIS — Z8542 Personal history of malignant neoplasm of other parts of uterus: Secondary | ICD-10-CM | POA: Insufficient documentation

## 2016-03-28 DIAGNOSIS — Z170 Estrogen receptor positive status [ER+]: Secondary | ICD-10-CM | POA: Insufficient documentation

## 2016-03-28 DIAGNOSIS — G4733 Obstructive sleep apnea (adult) (pediatric): Secondary | ICD-10-CM | POA: Diagnosis not present

## 2016-03-28 DIAGNOSIS — Z7984 Long term (current) use of oral hypoglycemic drugs: Secondary | ICD-10-CM | POA: Insufficient documentation

## 2016-03-28 DIAGNOSIS — G4730 Sleep apnea, unspecified: Secondary | ICD-10-CM | POA: Diagnosis not present

## 2016-03-28 DIAGNOSIS — D1430 Benign neoplasm of unspecified bronchus and lung: Secondary | ICD-10-CM | POA: Diagnosis not present

## 2016-03-28 DIAGNOSIS — E785 Hyperlipidemia, unspecified: Secondary | ICD-10-CM | POA: Diagnosis not present

## 2016-03-28 DIAGNOSIS — Z853 Personal history of malignant neoplasm of breast: Secondary | ICD-10-CM | POA: Diagnosis not present

## 2016-03-28 DIAGNOSIS — H409 Unspecified glaucoma: Secondary | ICD-10-CM | POA: Insufficient documentation

## 2016-03-28 DIAGNOSIS — R062 Wheezing: Secondary | ICD-10-CM | POA: Diagnosis not present

## 2016-03-28 DIAGNOSIS — C50911 Malignant neoplasm of unspecified site of right female breast: Secondary | ICD-10-CM | POA: Diagnosis not present

## 2016-03-28 DIAGNOSIS — I10 Essential (primary) hypertension: Secondary | ICD-10-CM | POA: Diagnosis not present

## 2016-03-28 DIAGNOSIS — Z86718 Personal history of other venous thrombosis and embolism: Secondary | ICD-10-CM | POA: Diagnosis not present

## 2016-03-28 DIAGNOSIS — C771 Secondary and unspecified malignant neoplasm of intrathoracic lymph nodes: Secondary | ICD-10-CM | POA: Diagnosis not present

## 2016-03-28 HISTORY — DX: Malignant (primary) neoplasm, unspecified: C80.1

## 2016-03-28 HISTORY — PX: ENDOBRONCHIAL ULTRASOUND: SHX5096

## 2016-03-28 LAB — GLUCOSE, CAPILLARY: Glucose-Capillary: 157 mg/dL — ABNORMAL HIGH (ref 65–99)

## 2016-03-28 SURGERY — ENDOBRONCHIAL ULTRASOUND (EBUS)
Anesthesia: General | Laterality: Bilateral

## 2016-03-28 MED ORDER — ROCURONIUM BROMIDE 50 MG/5ML IV SOSY
PREFILLED_SYRINGE | INTRAVENOUS | Status: AC
  Filled 2016-03-28: qty 5

## 2016-03-28 MED ORDER — LIDOCAINE 2% (20 MG/ML) 5 ML SYRINGE
INTRAMUSCULAR | Status: AC
Start: 2016-03-28 — End: 2016-03-28
  Filled 2016-03-28: qty 5

## 2016-03-28 MED ORDER — ONDANSETRON HCL 4 MG/2ML IJ SOLN
INTRAMUSCULAR | Status: AC
  Filled 2016-03-28: qty 2

## 2016-03-28 MED ORDER — LETROZOLE 2.5 MG PO TABS
2.5000 mg | ORAL_TABLET | Freq: Every day | ORAL | Status: DC
Start: 2016-03-28 — End: 2016-12-05

## 2016-03-28 MED ORDER — ROCURONIUM BROMIDE 10 MG/ML (PF) SYRINGE
PREFILLED_SYRINGE | INTRAVENOUS | Status: DC | PRN
  Administered 2016-03-28: 10 mg via INTRAVENOUS
  Administered 2016-03-28: 20 mg via INTRAVENOUS

## 2016-03-28 MED ORDER — SUCCINYLCHOLINE CHLORIDE 200 MG/10ML IV SOSY
PREFILLED_SYRINGE | INTRAVENOUS | Status: DC | PRN
  Administered 2016-03-28: 100 mg via INTRAVENOUS

## 2016-03-28 MED ORDER — LIDOCAINE HCL (CARDIAC) 20 MG/ML IV SOLN
INTRAVENOUS | Status: DC | PRN
  Administered 2016-03-28: 100 mg via INTRAVENOUS

## 2016-03-28 MED ORDER — ONDANSETRON HCL 4 MG/2ML IJ SOLN
INTRAMUSCULAR | Status: DC | PRN
  Administered 2016-03-28: 4 mg via INTRAVENOUS

## 2016-03-28 MED ORDER — PROPOFOL 10 MG/ML IV BOLUS
INTRAVENOUS | Status: AC
  Filled 2016-03-28: qty 40

## 2016-03-28 MED ORDER — FENTANYL CITRATE (PF) 100 MCG/2ML IJ SOLN
INTRAMUSCULAR | Status: DC | PRN
  Administered 2016-03-28: 50 ug via INTRAVENOUS

## 2016-03-28 MED ORDER — DEXAMETHASONE SODIUM PHOSPHATE 10 MG/ML IJ SOLN
INTRAMUSCULAR | Status: DC | PRN
  Administered 2016-03-28: 10 mg via INTRAVENOUS

## 2016-03-28 MED ORDER — SUGAMMADEX SODIUM 200 MG/2ML IV SOLN
INTRAVENOUS | Status: DC | PRN
  Administered 2016-03-28: 200 mg via INTRAVENOUS

## 2016-03-28 MED ORDER — FENTANYL CITRATE (PF) 100 MCG/2ML IJ SOLN
INTRAMUSCULAR | Status: AC
  Filled 2016-03-28: qty 2

## 2016-03-28 MED ORDER — SUCCINYLCHOLINE CHLORIDE 20 MG/ML IJ SOLN
INTRAMUSCULAR | Status: AC
  Filled 2016-03-28: qty 1

## 2016-03-28 MED ORDER — SUGAMMADEX SODIUM 200 MG/2ML IV SOLN
INTRAVENOUS | Status: AC
  Filled 2016-03-28: qty 2

## 2016-03-28 MED ORDER — PROPOFOL 10 MG/ML IV BOLUS
INTRAVENOUS | Status: DC | PRN
  Administered 2016-03-28: 200 mg via INTRAVENOUS

## 2016-03-28 MED ORDER — LACTATED RINGERS IV SOLN
INTRAVENOUS | Status: DC
Start: 2016-03-28 — End: 2016-03-28
  Administered 2016-03-28: 1000 mL via INTRAVENOUS

## 2016-03-28 NOTE — Transfer of Care (Signed)
Immediate Anesthesia Transfer of Care Note  Patient: [name]  Procedure(s) Performed: Procedure(s): ENDOBRONCHIAL ULTRASOUND (Bilateral)  Patient Location: PACU and Endoscopy Unit  Anesthesia Type:General  Level of Consciousness: awake, alert , oriented and patient cooperative  Airway & Oxygen Therapy: Patient Spontanous Breathing and Patient connected to face mask oxygen  Post-op Assessment: Report given to RN, Post -op Vital signs reviewed and stable and Patient moving all extremities  Post vital signs: Reviewed and stable  Last Vitals:  Vitals:   03/28/16 0650 03/28/16 0912  BP: (!) 180/75 (!) 176/72  Pulse: 65 86  Resp: 12 13  Temp: 36.8 C 36.6 C    Last Pain:  Vitals:   03/28/16 0912  TempSrc: Oral         Complications: No apparent anesthesia complications

## 2016-03-28 NOTE — Interval H&P Note (Signed)
PCCM Interval Note  70 yo woman, hx OSA not on CPAP, newly dx R breast CA (multifocal T2 N0, stage 2A invasive ductal carcinoma, grade 1, estrogen and progesterone receptor positive, HER-2 negative) on chemo as directed by Dr Griffith Citron. Her staging eval has identified R mediastinal and hilar LAD as well as subcarinal LAD. Also seen on Ct chest from 03/10/16 were many scattered subcentimeter nodules suggestive of metastatic disease. She presents today for further eval of these abnormalities. No new issues reported, no dyspnea or CP.   Vitals:   03/28/16 0648 03/28/16 0650  BP:  (!) 180/75  Pulse:  65  Resp:  12  Temp:  98.2 F (36.8 C)  TempSrc:  Oral  SpO2:  97%  Weight: 109.8 kg (242 lb)   Height: 5' 4" (1.626 m)    Gen: Pleasant, overwt woman, in no distress,  normal affect  ENT: No lesions,  mouth clear,  oropharynx clear, no postnasal drip, M4 airway  Neck: No JVD, no TMG, no carotid bruits  Lungs: No use of accessory muscles, clear without rales or rhonchi  Cardiovascular: RRR, heart sounds normal, no murmur or gallops, no peripheral edema  Musculoskeletal: No deformities, no cyanosis or clubbing  Neuro: alert, non focal  Skin: Warm, no lesions or rashes    CT 03/10/16 FINDINGS: CT CHEST FINDINGS  Cardiovascular: Heart size is normal. There is no significant pericardial fluid, thickening or pericardial calcification. There is aortic atherosclerosis, as well as atherosclerosis of the great vessels of the mediastinum and the coronary arteries, including calcified atherosclerotic plaque in the left anterior descending and left circumflex coronary arteries.  Mediastinum/Nodes: Multiple with borderline enlarged and mildly enlarged mediastinal and bilateral hilar lymph nodes measuring up to 16 mm in short axis in the subcarinal nodal station. Esophagus is unremarkable in appearance. Multiple prominent borderline enlarged right axillary lymph nodes measuring up to 8 mm  in short axis. No left axillary lymphadenopathy.  Lungs/Pleura: There are innumerable pulmonary nodules scattered throughout the lungs bilaterally, predominantly in a perilymphatic distribution, highly concerning for widespread lymphangitic spread of disease. The largest pulmonary nodules include a subpleural 7 x 11 mm nodule in the anterior aspect of the right lower lobe (image 71 of series 5), and a subpleural 11 x 7 mm nodule in the inferior aspect of the left lower lobe (image 98 of series 5). Extensive pleural thickening and nodularity is noted bilaterally (right greater than left). No pleural effusions.  Musculoskeletal: In the superior aspect of the right breast there are several enhancing soft tissue nodules, the largest of which measures 3.0 x 1.5 x 1.7 cm (axial image 20 of series 2 and coronal image 56 of series 602). There are no aggressive appearing lytic or blastic lesions noted in the visualized portions of the skeleton.  IMPRESSION: 1. 3.0 x 1.5 x 1.7 cm enhancing soft tissue nodule in the superior aspect of the right breast with several smaller surrounding nodules in the right breast and multiple borderline enlarged right axillary lymph nodes. In addition, there is evidence for widespread lymphangitic spread of disease throughout the lungs bilaterally, pleural involvement bilaterally, and metastatic lymphadenopathy in the mediastinal and hilar nodal stations bilaterally, as detailed above. 2. No definite evidence of infradiaphragmatic metastatic disease in the abdomen or pelvis. 3. 2.1 cm intermediate attenuation lesion in the upper pole of the right kidney is indeterminate. Statistically, this is likely to represent a small proteinaceous cyst. The possibility of a cystic neoplasm such as a papillary renal cell  carcinoma is not excluded, however, and close attention on followup studies is recommended. 4. Hepatic steatosis. 5. Aortic atherosclerosis, in addition  to 2 vessel coronary artery disease. Assessment for potential risk factor modification, dietary therapy or pharmacologic therapy may be warranted, if clinically indicated. 6. Small epigastric ventral hernia and small umbilical hernia as a, both of which contain only omental fat. No findings to suggest associated bowel incarceration or obstruction. 7. Colonic diverticulosis without evidence of acute diverticulitis at this time.  Plans:  Plan for EBUS to identify and bx mediastinal and hilar nodes. Consider random TBBx depending on initial cytology results and patient tolerance. Suspect that ransom TBBx will be low yield. Explained procedure, risks, benefits to pt in detail. She understands and elects to proceed. No barriers identified.   Baltazar Apo, MD, PhD 03/28/2016, 7:53 AM [facility] [phone] or if no answer [phone]

## 2016-03-28 NOTE — Anesthesia Postprocedure Evaluation (Signed)
Anesthesia Post Note  Patient: LAMA BURCKHARD  Procedure(s) Performed: Procedure(s) (LRB): ENDOBRONCHIAL ULTRASOUND (Bilateral)  Patient location during evaluation: Endoscopy Anesthesia Type: MAC Level of consciousness: awake and alert Pain management: pain level controlled Vital Signs Assessment: post-procedure vital signs reviewed and stable Respiratory status: spontaneous breathing, nonlabored ventilation, respiratory function stable and patient connected to nasal cannula oxygen Cardiovascular status: blood pressure returned to baseline and stable Postop Assessment: no signs of nausea or vomiting Anesthetic complications: no    Last Vitals:  Vitals:   03/28/16 0912 03/28/16 0940  BP: (!) 176/72 (!) 191/84  Pulse: 86   Resp: 13   Temp: 36.6 C     Last Pain:  Vitals:   03/28/16 0912  TempSrc: Oral                 Eva Vallee,JAMES TERRILL

## 2016-03-28 NOTE — Op Note (Signed)
Video Bronchoscopy with Endobronchial Ultrasound Procedure Note  Date of Operation: 03/28/2016  Pre-op Diagnosis: Mediastinal and hilar lymphadenopathy, pulmonary nodules  Post-op Diagnosis: same, suspected metastatic disease   Surgeon: Baltazar Apo  Assistants: None  Anesthesia: General endotracheal anesthesia  Operation: Flexible video fiberoptic bronchoscopy with endobronchial ultrasound and biopsies.  Estimated Blood Loss: Minimal  Complications: None apparent  Indications and History: [name] is a 70 y.o. female with hx R Breast CA. Her CT chest shows lymphadenopathy (4R, 7, 13R) and pulmonary nodularity suggestive of metastatic disease. Recommendation was to seek tissue diagnosis via endobronchial ultrasound and biopsies.  The risks, benefits, complications, treatment options and expected outcomes were discussed with the patient.  The possibilities of pneumothorax, pneumonia, reaction to medication, pulmonary aspiration, perforation of a viscus, bleeding, failure to diagnose a condition and creating a complication requiring transfusion or operation were discussed with the patient who freely signed the consent.    Description of Procedure: The patient was examined in the preoperative area and history and data from the preprocedure consultation were reviewed. It was deemed appropriate to proceed.  The patient was taken to [facility], identified as Van[facility] Kick and the procedure verified as Flexible Video Fiberoptic Bronchoscopy.  A Time Out was held and the above information confirmed. General anesthesia was initiated and the patient  was orally intubated. The video fiberoptic bronchoscope was introduced via the endotracheal tube and a general inspection was performed which showed normal airways throughout. There were no endobronchial lesions or abnormal secretions. The standard scope was then withdrawn and the endobronchial ultrasound was used to identify and characterize the  peritracheal, hilar and bronchial lymph nodes. Inspection showed an irregular and enlarged Station 7 node, enlarged 13R node. The 4R node seen on the patient's CT scan from 03/10/16 could not be identified. Using real-time ultrasound guidance Wang needle biopsies were take from Station 7 and 13R nodes and were sent for cytology. Initial quick-stain cytology was suggestive of malignant cells in both locations, final pathology pending. The patient tolerated the procedure well without apparent complications. There was no significant blood loss. The bronchoscope was withdrawn. Anesthesia was reversed and the patient was taken to Endoscopy for recovery.   Samples: 1. Wang needle biopsies from 7 node 2. Wang needle biopsies from 13R node   Plans:  The patient will be discharged from Endoscopy to home when recovered from anesthesia. We will review the cytology results with the patient when they become available. Outpatient followup will be with Dr DeDios and Dr Griffith Citron.Baltazar Apo, MD, PhD 03/28/2016, 9:02 AM [facility] [phone] or if no answer [phone]

## 2016-03-28 NOTE — Anesthesia Procedure Notes (Signed)
Procedure Name: Intubation Date/Time: 03/28/2016 7:56 AM Performed by: Carleene Cooper A Pre-anesthesia Checklist: Patient identified, Emergency Drugs available, Suction available, Timeout performed and Patient being monitored Patient Re-evaluated:Patient Re-evaluated prior to inductionOxygen Delivery Method: Circle system utilized Preoxygenation: Pre-oxygenation with 100% oxygen Intubation Type: IV induction Ventilation: Mask ventilation without difficulty Laryngoscope Size: Mac and 4 Grade View: Grade II Tube type: Oral Tube size: 8.5 mm Number of attempts: 2 Airway Equipment and Method: Stylet Placement Confirmation: ETT inserted through vocal cords under direct vision,  positive ETCO2 and breath sounds checked- equal and bilateral Secured at: 23 cm Tube secured with: Tape Dental Injury: Teeth and Oropharynx as per pre-operative assessment  Comments: 8.5 ETT placed per Dr. Lamonte Sakai request

## 2016-03-28 NOTE — Discharge Instructions (Signed)
Flexible Bronchoscopy, Care After °These instructions give you information on caring for yourself after your procedure. Your doctor may also give you more specific instructions. Call your doctor if you have any problems or questions after your procedure. °HOME CARE °· Do not eat or drink anything for 2 hours after your procedure. If you try to eat or drink before the medicine wears off, food or drink could go into your lungs. You could also burn yourself. °· After 2 hours have passed and when you can cough and gag normally, you may eat soft food and drink liquids slowly. °· The day after the test, you may eat your normal diet. °· You may do your normal activities. °· Keep all doctor visits. °GET HELP RIGHT AWAY IF: °· You get more and more short of breath. °· You get light-headed. °· You feel like you are going to pass out (faint). °· You have chest pain. °· You have new problems that worry you. °· You cough up more than a little blood. °· You cough up more blood than before. °MAKE SURE YOU: °· Understand these instructions. °· Will watch your condition. °· Will get help right away if you are not doing well or get worse. ° °PLEASE CALL OUR OFFICE FOR ANY QUESTIONS OR CONCERNS. [phone].  °  °This information is not intended to replace advice given to you by your health care provider. Make sure you discuss any questions you have with your health care provider. °  °Document Released: 03/27/2009 Document Revised: 06/04/2013 Document Reviewed: 02/01/2013 °[employer] Interactive Patient Education ©2016 [employer] ° °

## 2016-03-28 NOTE — H&P (View-Only) (Signed)
Subjective:    Patient ID: [name], female    DOB: 05-Oct-1945, 70 y.o.   MRN: [id number]  HPI   This is the case of [name], 70 y.o. Female, who was referred by Dr. Lurline Del  in consultation regarding Breast Ca with lung metastasis.   As you very well know, patient is a non smoker, not been diagnosed with any lung problems. Patient was recently diagnosed with Breast CA in 02/2016, R breast.  Currently on chemotherapy.  Patient currently sees you in based on your note, she has clinically multifocal T2 No, stage 2A invasive ductal carcinoma, grade 1, estrogen and progesterone receptor positive, HER-2 negative. You  started her on chemotherapy.  As part of the workup for the malignancy, you ended up getting a chest and abdominal CT scan. In the chest CT scan, there was a 3 x 1.5 x 1.7 enhancing soft tissue nodule in the superior aspect of the right breast. She also had innumerable pulmonary nodules scattered throughout the lungs bilaterally, predominantly in a perilymphatic distribution, concerning for widespread lymphangitic spread. Nodules were sub-centimeters.  Patient also had multiple with borderline enlarged and mildly enlarged mediastinal and bilateral hilar lymph nodes measuring up to 16 mm. No other lung infiltrates or masses were seen.  Patient is here for her abnormal lung CT scan.  Patient was diagnosed with sleep apnea years ago. She was on CPAP therapy. She stopped it since it was not making a difference.   Review of Systems  Constitutional: Negative.  Negative for fever and unexpected weight change.  HENT: Negative for congestion, dental problem, ear pain, nosebleeds, postnasal drip, rhinorrhea, sinus pressure, sneezing, sore throat and trouble swallowing.   Eyes: Negative.  Negative for redness and itching.  Respiratory: Positive for cough. Negative for chest tightness, shortness of breath and wheezing.   Cardiovascular: Negative.  Negative for palpitations  and leg swelling.  Gastrointestinal: Negative.  Negative for nausea and vomiting.  Endocrine: Negative.   Genitourinary: Negative.  Negative for dysuria.  Musculoskeletal: Negative.  Negative for joint swelling.  Skin: Negative.  Negative for rash.  Allergic/Immunologic: Positive for environmental allergies.  Neurological: Positive for headaches.  Hematological: Negative.  Does not bruise/bleed easily.  Psychiatric/Behavioral: Negative.  Negative for dysphoric mood. The patient is not nervous/anxious.    Past Medical History:  Diagnosis Date  . Cancer ([place]) 02/2016   right breast  . DIABETES MELLITUS, TYPE II 01/04/2007   only takes actoplus daily  . Dizziness and giddiness 02/29/2008  . DVT, HX OF    at age 21 in right buttocks  . Family history of breast cancer   . GERD 01/04/2007   pt reports resolved   . GLAUCOMA 07/30/2008   both eyes  . History of blood transfusion    no abnormal  reaction  . History of uterine cancer 2000   hysterectomy done  . HYPERLIPIDEMIA 01/04/2007   taking Pravastatin daily  . HYPERTENSION 01/04/2007   takes Lisinopril daily  . Joint pain   . Joint swelling   . LEG PAIN, LEFT 07/06/2007  . NUMBNESS 07/30/2008   in fingers;pt states from Diamox  . OSTEOARTHRITIS, HIP 09/25/2009  . OTITIS MEDIA, ACUTE, BILATERAL 02/29/2008  . Overweight(278.02) 01/04/2007  . PONV (postoperative nausea and vomiting)   . SLEEP APNEA, OBSTRUCTIVE    doesn't use a cpap;study done about 39yr ago  . TRANSIENT ISCHEMIC ATTACK, HX OF 01/04/2007  . Vision loss    left eye  Had uterine CA in 2000 > had hysterectomy.   Family History  Problem Relation Age of Onset  . Dementia Mother   . Cancer Mother     Breast and lung cancer  . Stroke Sister   . Breast cancer Sister 80  . Heart attack Maternal Aunt   . Lung cancer Maternal Grandmother     non smoker  . Glaucoma Maternal Grandfather   . Anesthesia problems Neg Hx      Past Surgical History:  Procedure  Laterality Date  . ABDOMINAL HYSTERECTOMY  2000  . CHOLECYSTECTOMY    . EYE SURGERY  13   shunt left and lazer eye surgery on right cataract and retenia tear with repair  . growth removal  2004   from thumb  . KNEE ARTHROSCOPY Right   . mulitple eye surgeries     both eyes, cataracts with ioc done both eyes  . OOPHORECTOMY    . TOTAL HIP ARTHROPLASTY  06/24/2011   Procedure: TOTAL HIP ARTHROPLASTY;  Surgeon: Kerin Salen;  Location: [facility];  Service: Orthopedics;  Laterality: Right;  . TOTAL HIP ARTHROPLASTY Left 11/12/2012   Dr Mayer Camel  . TOTAL HIP ARTHROPLASTY Left 11/12/2012   Procedure: TOTAL HIP ARTHROPLASTY;  Surgeon: Kerin Salen, MD;  Location: [facility];  Service: Orthopedics;  Laterality: Left;  DEPUY PINNACLE    Social History   Social History  . Marital status: Married    Spouse name: N/A  . Number of children: N/A  . Years of education: N/A   Occupational History  . office administrator [employer]   Social History Main Topics  . Smoking status: Never Smoker  . Smokeless tobacco: Never Used  . Alcohol use No  . Drug use: No  . Sexual activity: Not Currently   Other Topics Concern  . Not on file   Social History Narrative  . No narrative on file   Lives in [place].   Allergies  Allergen Reactions  . Atorvastatin Other (See Comments)    Leg cramp  . Fluorescein Nausea And Vomiting    ? IV dye for retina specialist  . Lipitor [Atorvastatin Calcium]     Leg cramp  . Oxycodone Nausea And Vomiting    Patient vomited for 3 days after taking  . Sitagliptin Phosphate Nausea And Vomiting  . Sulfa Drugs Cross Reactors Nausea And Vomiting     Outpatient Medications Prior to Visit  Medication Sig Dispense Refill  . aspirin 81 MG tablet Take 81 mg by mouth daily. Reported on 06/12/2015    . letrozole (FEMARA) 2.5 MG tablet Take 1 tablet (2.5 mg total) by mouth daily. (Patient taking differently: Take 2.5 mg by mouth at bedtime. ) 90 tablet 4  .  lisinopril-hydrochlorothiazide (PRINZIDE,ZESTORETIC) 20-12.5 MG tablet TAKE ONE TABLET BY MOUTH EVERY DAY    . prednisoLONE acetate (PRED FORTE) 1 % ophthalmic suspension 1 drop every morning.     . Brimonidine Tartrate-Timolol (COMBIGAN OP) Place 1 drop into both eyes 2 (two) times daily.     . pioglitazone-metformin (ACTOPLUS MET) 15-500 MG tablet TAKE ONE TABLET BY MOUTH ONCE DAILY 90 tablet 2  . palbociclib (IBRANCE) 125 MG capsule Take 1 capsule (125 mg total) by mouth daily with breakfast. Take whole with food. (Patient not taking: Reported on 03/23/2016) 21 capsule 6  . bimatoprost (LUMIGAN) 0.03 % ophthalmic solution Place 1 drop into both eyes at bedtime.    . lovastatin (MEVACOR) 40 MG tablet Take  1 tablet (40 mg total) by mouth at bedtime. 90 tablet 3   No facility-administered medications prior to visit.    Meds ordered this encounter  Medications  . bimatoprost (LUMIGAN) 0.01 % SOLN    Sig: 1 drop nightly.  . brimonidine-timolol (COMBIGAN) 0.2-0.5 % ophthalmic solution    Sig: Place 1 drop into both eyes tid  . pioglitazone-metformin (ACTOPLUS MET) 15-500 MG tablet    Sig: Take 15-500 tablets by mouth daily.  Marland Kitchen lovastatin (MEVACOR) 20 MG tablet    Sig: Take 20 mg by mouth every evening.          Objective:   Physical Exam   Vitals:  Vitals:   03/22/16 0936  BP: (!) 148/88  Pulse: 63  SpO2: 93%  Weight: 242 lb (109.8 kg)  Height: '5\' 4"'$  (1.626 m)    Constitutional/General:  Pleasant, well-nourished, well-developed, not in any distress,  Comfortably seating.  Well kempt  Body mass index is 41.54 kg/m. Wt Readings from Last 3 Encounters:  03/22/16 242 lb (109.8 kg)  03/15/16 243 lb 3.2 oz (110.3 kg)  02/03/16 247 lb 11.2 oz (112.4 kg)     HEENT: Pupils equal and reactive to light and accommodation. Anicteric sclerae. Normal nasal mucosa.   No oral  lesions,  mouth clear,  oropharynx clear, no postnasal drip. (-) Oral thrush. No dental caries.  Airway -  Mallampati class III  Neck: No masses. Midline trachea. No JVD, (-) LAD. (-) bruits appreciated.  Respiratory/Chest: Grossly normal chest. (-) deformity. (-) Accessory muscle use.  Symmetric expansion. (-) Tenderness on palpation.  Resonant on percussion.  Diminished BS on both lower lung zones. (-) wheezing, crackles, rhonchi (-) egophony  Cardiovascular: Regular rate and  rhythm, heart sounds normal, no murmur or gallops, no peripheral edema  Gastrointestinal:  Normal bowel sounds. Soft, non-tender. No hepatosplenomegaly.  (-) masses.   Musculoskeletal:  Normal muscle tone. Normal gait.   Extremities: Grossly normal. (-) clubbing, cyanosis.  (-) edema  Skin: (-) rash,lesions seen.   Neurological/Psychiatric : alert, oriented to time, place, person. Normal mood and affect         Assessment & Plan:  Pulmonary nodules Patient was recently diagnosed with Breast CA in 02/2016, R breast.  Currently on chemotherapy.  Patient currently sees you in based on your note, she has clinically multifocal T2 No, stage 2A invasive ductal carcinoma, grade 1, estrogen and progesterone receptor positive, HER-2 negative. You  started her on chemotherapy.  As part of the workup for the malignancy, you ended up getting a chest and abdominal CT scan. In the chest CT scan, there was a 3 x 1.5 x 1.7 enhancing soft tissue nodule in the superior aspect of the right breast. She also had innumerable pulmonary nodules scattered throughout the lungs bilaterally, predominantly in a perilymphatic distribution, concerning for widespread lymphangitic spread. Nodules were sub-centimeters.  Patient also had multiple with borderline enlarged and mildly enlarged mediastinal and bilateral hilar lymph nodes measuring up to 16 mm. No other lung infiltrates or masses were seen.  Patient is here for her abnormal lung CT scan.   I personally reviewed previous images (Chest Xray, Chest Ct scan) done on this patient. I  reviewed the reports on the images as well.  I reviewed the images with the patient.  Personally, the nodules are too small and too peripheral to be biopsied. I don't see any airways leading to the nodules. Also, some nodules are towards the pleura. The mediastinal and hilar  lymphadenopathy are not as prominent as well. I will discuss the case with Dr. Lamonte Sakai. He usually does the Bronchoscopy with EBUS and discuss with him whether he will be confident enough to get good samples. As mentioned, the lymph nodes looked small. I will call the patient after my discussion with Dr. Lamonte Sakai.  In the meantime, I will probably be conservative at this point and repeat a chest CT scan with contrast in 3 months. If the nodules are bigger or LAD is bigger, may end up needing bronchoscopy and Biopsy and EBUS. Patient is currently on treatment for her breast cancer.  Patient and husband are in agreement. We'll schedule chest CT scan today.  Addendum 03/23/2016:  Dr. Lamonte Sakai reviewed chest ct scan images. He thinks he can access lymph nodes 7 and 4R via EBUS.  We will set up for Bronchoscopy with Biopsy for this Monday at 7:30am at [facility] Pt and Dr. Lamonte Sakai aware.   Obstructive sleep apnea Patient has obstructive sleep apnea diagnosed several years ago. She was on CPAP therapy but was not compliant. May need to discuss with her CPAP therapy if she she will need a biopsy/bronchoscopy.   Morbid obesity ([place]) Weight reduction     Thank you very much for letting me participate in this patient's care. Please do not hesitate to give me a call if you have any questions or concerns regarding the treatment plan.   Patient will follow up with me in 3 months.      Monica Becton, MD 03/23/2016   1:23 PM Pulmonary and [place] Pager: [phone] Office: [phone], Fax: [phone]

## 2016-03-29 ENCOUNTER — Encounter (HOSPITAL_COMMUNITY): Payer: Self-pay | Admitting: Emergency Medicine

## 2016-03-30 ENCOUNTER — Telehealth: Payer: Self-pay | Admitting: Emergency Medicine

## 2016-03-30 NOTE — Telephone Encounter (Signed)
Discussed results with pt by phone. Cytology consistent with primary breast CA metastatic to lungs and mediastinal nodes. She is undergoing rx with Dr Griffith Citron. Will forward to him and to Dr DeDios.

## 2016-03-31 ENCOUNTER — Other Ambulatory Visit: Payer: Self-pay | Admitting: *Deleted

## 2016-03-31 ENCOUNTER — Other Ambulatory Visit: Payer: Self-pay | Admitting: Oncology

## 2016-03-31 MED ORDER — PROMETHAZINE-CODEINE 6.25-10 MG/5ML PO SYRP: 5.0000 mL | ORAL_SOLUTION | Freq: Two times a day (BID) | ORAL | 0 refills | Status: DC | PRN

## 2016-03-31 NOTE — Progress Notes (Signed)
[place]  [phone] [phone]     ID: PHALLON HAYDU DOB: Apr 21, 1946  MR#: [id number]  RCB#:[id number]  Patient Care Team: Biagio Borg, MD as PCP - General Alphonsa Overall, MD as Consulting Physician (General Surgery) Chauncey Cruel, MD as Consulting Physician (Oncology) Kyung Rudd, MD as Consulting Physician (Radiation Oncology) Bobbye Charleston, MD as Consulting Physician (Obstetrics and Gynecology) Nada Libman, MD as Referring Physician (Specialist) Vevelyn Royals, MD as Consulting Physician (Ophthalmology) OTHER MD:  CHIEF COMPLAINT: Estrogen receptor positive breast cancer  CURRENT TREATMENT: Letrozole, palpable palbociclib   BREAST CANCER HISTORY: From the original intake note:  Elainna had screening mammography showing some suspicious calcifications in the right breast leading to right diagnostic mammography with ultrasonography 01/22/2016 at [place]. The breast density was category C. In the upper right breast there was a 2.3 cm mass with additional masses measuring 0.9 and 0.7 cm. There was also a possible additional 0.8 mass in the lower inner quadrant. Ultrasound confirmed an irregular hypoechoic mass in the right breast upper outer quadrant measuring 2.0 cm. There were other masses measuring 0.7 and 0.8 cm by ultrasonography. The right axilla was sonographically benign.  Biopsy of a 12:00 and 4:00 mass in the right breast 01/22/2016 showed ([id number]) both specimens showing invasive ductal carcinoma, grade 1 or 2, both 95% estrogen receptor positive, both 95% progesterone receptor positive, both with strong staining intensity, with MIB-1 ranging from 10-15%, and both HER-2 negative, the signals ratio being 1.23-1.42, and the number per cell 1.85-2.59.  Her subsequent history is as detailed below  INTERVAL HISTORY: Ciana returns today for follow-up of her estrogen receptor positive breast cancer accompanied by her husband  [name]. Since her last visit here she had a breast MRI 02/22/2016, which showed multiple lesions in the right breast, extending over an area of 10.7 cm, the largest lesion measuring 2.2 cm. There were also suspicious lymph nodes in the right axilla and one of these was biopsied 03/02/2016, and this showed ([id number]) metastatic breast cancer, strongly estrogen and progesterone receptor positive.  Resa was then staged with CT scans of the chest abdomen and pelvis 03/10/2016. In addition to the right breast and right axillary masses, there were multiple bilateral very small lung nodules, mostly subcentimeter, as well as mediastinal and bilateral hilar lymph nodes. This was felt to be consistent with metastatic disease and suggestive of lymphangitic spread.  Latrica is here today to discuss those results.  REVIEW OF SYSTEMS: She has a dry cough, but no other pulmonary symptoms and particularly no pleurisy or hemoptysis. She is not more short of breath than anyone else she knows. She complains of pain in the medial aspect of the right breast and occasionally has a shooting pain in the right nipple. None of these are particularly intense. She does describe herself as tired. She is sleeping poorly. She has had no unusual headaches, visual changes, nausea, vomiting, dizziness, or gait imbalance. A detailed review of systems today was otherwise stable.  PAST MEDICAL HISTORY: Past Medical History:  Diagnosis Date  . Cancer ([place]) 02/2016   right breast  . DIABETES MELLITUS, TYPE II 01/04/2007   only takes actoplus daily  . Dizziness and giddiness 02/29/2008  . DVT, HX OF    at age 18 in right buttocks  . Family history of breast cancer   . GERD 01/04/2007   pt reports resolved   . GLAUCOMA 07/30/2008   both eyes  . History  of blood transfusion    no abnormal  reaction  . History of uterine cancer 2000   hysterectomy done  . HYPERLIPIDEMIA 01/04/2007   taking Pravastatin daily  . HYPERTENSION  01/04/2007   takes Lisinopril daily  . Joint pain   . Joint swelling   . LEG PAIN, LEFT 07/06/2007  . NUMBNESS 07/30/2008   in fingers;pt states from Diamox  . OSTEOARTHRITIS, HIP 09/25/2009  . OTITIS MEDIA, ACUTE, BILATERAL 02/29/2008  . Overweight(278.02) 01/04/2007  . PONV (postoperative nausea and vomiting)   . SLEEP APNEA, OBSTRUCTIVE    doesn't use a cpap;study done about 15yr ago  . TRANSIENT ISCHEMIC ATTACK, HX OF 01/04/2007  . Vision loss    left eye    PAST SURGICAL HISTORY: Past Surgical History:  Procedure Laterality Date  . ABDOMINAL HYSTERECTOMY  2000  . CHOLECYSTECTOMY    . ENDOBRONCHIAL ULTRASOUND Bilateral 03/28/2016   Procedure: ENDOBRONCHIAL ULTRASOUND;  Surgeon: RCollene Gobble MD;  Location: [facility];  Service: Cardiopulmonary;  Laterality: Bilateral;  . EYE SURGERY  13   shunt left and lazer eye surgery on right cataract and retenia tear with repair  . growth removal  2004   from thumb  . KNEE ARTHROSCOPY Right   . mulitple eye surgeries     both eyes, cataracts with ioc done both eyes  . OOPHORECTOMY    . TOTAL HIP ARTHROPLASTY  06/24/2011   Procedure: TOTAL HIP ARTHROPLASTY;  Surgeon: FKerin Salen  [facility]  Service: Orthopedics;  Laterality: Right;  . TOTAL HIP ARTHROPLASTY Left 11/12/2012   Dr RMayer Camel . TOTAL HIP ARTHROPLASTY Left 11/12/2012   Procedure: TOTAL HIP ARTHROPLASTY;  Surgeon: FKerin Salen MD;  [facility]  Service: Orthopedics;  Laterality: Left;  DEPUY PINNACLE    FAMILY HISTORY Family History  Problem Relation Age of Onset  . Dementia Mother   . Cancer Mother     Breast and lung cancer  . Stroke Sister   . Breast cancer Sister [id number] . Heart attack Maternal Aunt   . Lung cancer Maternal Grandmother     non smoker  . Glaucoma Maternal Grandfather   . Anesthesia problems Neg Hx   The patient's father died at age 673 the patient's mother died at age 455 She had breast and lung cancers diagnosed shortly before her  death. The patient had no brothers, 2 sisters. One sister was diagnosed with breast cancer at the age of 577  GYNECOLOGIC HISTORY:  No LMP recorded. Patient has had a hysterectomy. Menarche age 70 first live birth age 70 the patient is GX P1. She had a hysterectomy for endometrial cancer in the year 2000. She did not take hormone replacement. She did use oral contraceptives for your than 20 years remotely, with no complications.  SOCIAL HISTORY:  BMistiis retired. She is home with her husband [name] Their son C[place]anda [place].    ADVANCED DIRECTIVES: In place  HEALTH MAINTENANCE: Social History  Substance Use Topics  . Smoking status: Never Smoker  . Smokeless tobacco: Never Used  . Alcohol use No     Colonoscopy: Never  PAP: Status post hysterectomy  Bone density: Remote   Allergies  Allergen Reactions  . Atorvastatin Other (See Comments)    Leg cramp  . Fluorescein Nausea And Vomiting    ? IV dye for retina specialist  . Lipitor [Atorvastatin Calcium]     Leg cramp  . Oxycodone Nausea  And Vomiting    Patient vomited for 3 days after taking  . Sitagliptin Phosphate Nausea And Vomiting  . Sulfa Drugs Cross Reactors Nausea And Vomiting    Current Outpatient Prescriptions  Medication Sig Dispense Refill  . aspirin 81 MG tablet Take 81 mg by mouth daily. Reported on 06/12/2015    . bimatoprost (LUMIGAN) 0.01 % SOLN 1 drop nightly.    . brimonidine-timolol (COMBIGAN) 0.2-0.5 % ophthalmic solution Place 1 drop into both eyes tid    . letrozole (FEMARA) 2.5 MG tablet Take 1 tablet (2.5 mg total) by mouth at bedtime.    Marland Kitchen lisinopril-hydrochlorothiazide (PRINZIDE,ZESTORETIC) 20-12.5 MG tablet TAKE ONE TABLET BY MOUTH EVERY DAY    . lovastatin (MEVACOR) 20 MG tablet Take 20 mg by mouth every evening.     . palbociclib (IBRANCE) 125 MG capsule Take 1 capsule (125 mg total) by mouth daily with breakfast. Take whole with food. 21 capsule 6    . pioglitazone-metformin (ACTOPLUS MET) 15-500 MG tablet Take 15-500 tablets by mouth daily.    . prednisoLONE acetate (PRED FORTE) 1 % ophthalmic suspension 1 drop every morning.      No current facility-administered medications for this visit.     OBJECTIVE: Middle-aged white woman In no acute distress There were no vitals filed for this visit.   There is no height or weight on file to calculate BMI.    ECOG FS:1 - Symptomatic but completely ambulatory  Sclerae unicteric, pupils round and equal Oropharynx clear and moist-- no thrush or other lesions No cervical or supraclavicular adenopathy Lungs no rales or rhonchi Heart regular rate and rhythm Abd soft, obese, nontender, positive bowel sounds MSK no focal spinal tenderness, no upper extremity lymphedema Neuro: nonfocal, well oriented, appropriate affect Breasts: Deferred   LAB RESULTS:  CMP     Component Value Date/Time   NA 141 02/03/2016 0813   K 4.0 02/03/2016 0813   CL 106 04/29/2015 1730   CO2 26 02/03/2016 0813   GLUCOSE 200 (H) 02/03/2016 0813   BUN 10.2 02/03/2016 0813   CREATININE 0.8 02/03/2016 0813   CALCIUM 9.1 02/03/2016 0813   PROT 7.1 02/03/2016 0813   ALBUMIN 3.6 02/03/2016 0813   AST 12 02/03/2016 0813   ALT 13 02/03/2016 0813   ALKPHOS 54 02/03/2016 0813   BILITOT 0.48 02/03/2016 0813   GFRNONAA >90 11/13/2012 0430   GFRAA >90 11/13/2012 0430    INo results found for: SPEP, UPEP  Lab Results  Component Value Date   WBC 7.9 02/03/2016   NEUTROABS 5.1 02/03/2016   HGB 12.0 02/03/2016   HCT 36.0 02/03/2016   MCV 82.6 02/03/2016   PLT 187 02/03/2016      Chemistry      Component Value Date/Time   NA 141 02/03/2016 0813   K 4.0 02/03/2016 0813   CL 106 04/29/2015 1730   CO2 26 02/03/2016 0813   BUN 10.2 02/03/2016 0813   CREATININE 0.8 02/03/2016 0813      Component Value Date/Time   CALCIUM 9.1 02/03/2016 0813   ALKPHOS 54 02/03/2016 0813   AST 12 02/03/2016 0813   ALT 13  02/03/2016 0813   BILITOT 0.48 02/03/2016 0813       No results found for: LABCA2  No components found for: LABCA125  No results for input(s): INR in the last 168 hours.  Urinalysis    Component Value Date/Time   COLORURINE YELLOW 10/30/2014 [place] 10/30/2014 1513   LABSPEC  1.010 10/30/2014 1513   PHURINE 7.0 10/30/2014 1513   GLUCOSEU NEGATIVE 10/30/2014 1513   HGBUR NEGATIVE 10/30/2014 1513   BILIRUBINUR NEGATIVE 10/30/2014 1513   KETONESUR NEGATIVE 10/30/2014 1513   PROTEINUR NEGATIVE 11/08/2012 1319   UROBILINOGEN 0.2 10/30/2014 1513   NITRITE NEGATIVE 10/30/2014 1513   LEUKOCYTESUR NEGATIVE 10/30/2014 1513     STUDIES: Ct Chest W Contrast  Result Date: 03/10/2016 CLINICAL DATA:  70 year old female with history of right-sided breast cancer diagnosed in August 2017. Additional history of uterine cancer diagnosed in 2000. EXAM: CT CHEST, ABDOMEN, AND PELVIS WITH CONTRAST TECHNIQUE: Multidetector CT imaging of the chest, abdomen and pelvis was performed following the standard protocol during bolus administration of intravenous contrast. CONTRAST:  160m ISOVUE-300 IOPAMIDOL (ISOVUE-300) INJECTION 61% COMPARISON:  None. FINDINGS: CT CHEST FINDINGS Cardiovascular: Heart size is normal. There is no significant pericardial fluid, thickening or pericardial calcification. There is aortic atherosclerosis, as well as atherosclerosis of the great vessels of the mediastinum and the coronary arteries, including calcified atherosclerotic plaque in the left anterior descending and left circumflex coronary arteries. Mediastinum/Nodes: Multiple with borderline enlarged and mildly enlarged mediastinal and bilateral hilar lymph nodes measuring up to 16 mm in short axis in the subcarinal nodal station. Esophagus is unremarkable in appearance. Multiple prominent borderline enlarged right axillary lymph nodes measuring up to 8 mm in short axis. No left axillary lymphadenopathy.  Lungs/Pleura: There are innumerable pulmonary nodules scattered throughout the lungs bilaterally, predominantly in a perilymphatic distribution, highly concerning for widespread lymphangitic spread of disease. The largest pulmonary nodules include a subpleural 7 x 11 mm nodule in the anterior aspect of the right lower lobe (image 71 of series 5), and a subpleural 11 x 7 mm nodule in the inferior aspect of the left lower lobe (image 98 of series 5). Extensive pleural thickening and nodularity is noted bilaterally (right greater than left). No pleural effusions. Musculoskeletal: In the superior aspect of the right breast there are several enhancing soft tissue nodules, the largest of which measures 3.0 x 1.5 x 1.7 cm (axial image 20 of series 2 and coronal image 56 of series 602). There are no aggressive appearing lytic or blastic lesions noted in the visualized portions of the skeleton. CT ABDOMEN PELVIS FINDINGS Hepatobiliary: Diffuse low attenuation throughout the hepatic parenchyma, compatible with a background of hepatic steatosis. No cystic or solid hepatic lesions. Status post cholecystectomy. Mild intrahepatic and moderate extrahepatic biliary ductal dilatation, favored to reflect benign post cholecystectomy physiology. No definite calcified stone identified in the common bile duct. Pancreas: No pancreatic mass. No pancreatic ductal dilatation. No pancreatic or peripancreatic fluid or inflammatory changes. Spleen: Unremarkable. Adrenals/Urinary Tract: In the upper pole of the right kidney there is a 2.1 cm intermediate attenuation (30 HU) lesion which is otherwise simple in appearance. Left kidney and right adrenal gland are normal in appearance. In the lateral limb of the left adrenal gland there is a 1 cm indeterminate nodule. No hydroureteronephrosis. Urinary bladder is partially obscured by extensive beam hardening artifact from the patient's bilateral hip arthroplasties, but the visualized portions of the  urinary bladder are unremarkable in appearance. Stomach/Bowel: Normal appearance of the stomach. There is no pathologic dilatation of small bowel or colon. Numerous colonic diverticulae are noted, without surrounding inflammatory changes to suggest an acute diverticulitis at this time. Normal appendix. Vascular/Lymphatic: Aortic atherosclerosis, without evidence of aneurysm or dissection in the abdominal or pelvic vasculature. No lymphadenopathy noted in the abdomen or pelvis. Reproductive: Status post hysterectomy. Ovaries  are not confidently identified may be surgically absent or atrophic. Other: Small umbilical hernia containing only omental fat. Small epigastric ventral hernia containing only omental fat. No significant volume of ascites. No pneumoperitoneum. Musculoskeletal: There are no aggressive appearing lytic or blastic lesions noted in the visualized portions of the skeleton. Postoperative changes of bilateral hip arthroplasty are noted. IMPRESSION: 1. 3.0 x 1.5 x 1.7 cm enhancing soft tissue nodule in the superior aspect of the right breast with several smaller surrounding nodules in the right breast and multiple borderline enlarged right axillary lymph nodes. In addition, there is evidence for widespread lymphangitic spread of disease throughout the lungs bilaterally, pleural involvement bilaterally, and metastatic lymphadenopathy in the mediastinal and hilar nodal stations bilaterally, as detailed above. 2. No definite evidence of infradiaphragmatic metastatic disease in the abdomen or pelvis. 3. 2.1 cm intermediate attenuation lesion in the upper pole of the right kidney is indeterminate. Statistically, this is likely to represent a small proteinaceous cyst. The possibility of a cystic neoplasm such as a papillary renal cell carcinoma is not excluded, however, and close attention on followup studies is recommended. 4. Hepatic steatosis. 5. Aortic atherosclerosis, in addition to 2 vessel coronary  artery disease. Assessment for potential risk factor modification, dietary therapy or pharmacologic therapy may be warranted, if clinically indicated. 6. Small epigastric ventral hernia and small umbilical hernia as a, both of which contain only omental fat. No findings to suggest associated bowel incarceration or obstruction. 7. Colonic diverticulosis without evidence of acute diverticulitis at this time. 8. Additional incidental findings, as above. Electronically Signed   By: Vinnie Langton M.D.   On: 03/10/2016 09:06   Ct Abdomen Pelvis W Contrast  Result Date: 03/10/2016 CLINICAL DATA:  70 year old female with history of right-sided breast cancer diagnosed in August 2017. Additional history of uterine cancer diagnosed in 2000. EXAM: CT CHEST, ABDOMEN, AND PELVIS WITH CONTRAST TECHNIQUE: Multidetector CT imaging of the chest, abdomen and pelvis was performed following the standard protocol during bolus administration of intravenous contrast. CONTRAST:  136m ISOVUE-300 IOPAMIDOL (ISOVUE-300) INJECTION 61% COMPARISON:  None. FINDINGS: CT CHEST FINDINGS Cardiovascular: Heart size is normal. There is no significant pericardial fluid, thickening or pericardial calcification. There is aortic atherosclerosis, as well as atherosclerosis of the great vessels of the mediastinum and the coronary arteries, including calcified atherosclerotic plaque in the left anterior descending and left circumflex coronary arteries. Mediastinum/Nodes: Multiple with borderline enlarged and mildly enlarged mediastinal and bilateral hilar lymph nodes measuring up to 16 mm in short axis in the subcarinal nodal station. Esophagus is unremarkable in appearance. Multiple prominent borderline enlarged right axillary lymph nodes measuring up to 8 mm in short axis. No left axillary lymphadenopathy. Lungs/Pleura: There are innumerable pulmonary nodules scattered throughout the lungs bilaterally, predominantly in a perilymphatic distribution,  highly concerning for widespread lymphangitic spread of disease. The largest pulmonary nodules include a subpleural 7 x 11 mm nodule in the anterior aspect of the right lower lobe (image 71 of series 5), and a subpleural 11 x 7 mm nodule in the inferior aspect of the left lower lobe (image 98 of series 5). Extensive pleural thickening and nodularity is noted bilaterally (right greater than left). No pleural effusions. Musculoskeletal: In the superior aspect of the right breast there are several enhancing soft tissue nodules, the largest of which measures 3.0 x 1.5 x 1.7 cm (axial image 20 of series 2 and coronal image 56 of series 602). There are no aggressive appearing lytic or blastic lesions noted in  the visualized portions of the skeleton. CT ABDOMEN PELVIS FINDINGS Hepatobiliary: Diffuse low attenuation throughout the hepatic parenchyma, compatible with a background of hepatic steatosis. No cystic or solid hepatic lesions. Status post cholecystectomy. Mild intrahepatic and moderate extrahepatic biliary ductal dilatation, favored to reflect benign post cholecystectomy physiology. No definite calcified stone identified in the common bile duct. Pancreas: No pancreatic mass. No pancreatic ductal dilatation. No pancreatic or peripancreatic fluid or inflammatory changes. Spleen: Unremarkable. Adrenals/Urinary Tract: In the upper pole of the right kidney there is a 2.1 cm intermediate attenuation (30 HU) lesion which is otherwise simple in appearance. Left kidney and right adrenal gland are normal in appearance. In the lateral limb of the left adrenal gland there is a 1 cm indeterminate nodule. No hydroureteronephrosis. Urinary bladder is partially obscured by extensive beam hardening artifact from the patient's bilateral hip arthroplasties, but the visualized portions of the urinary bladder are unremarkable in appearance. Stomach/Bowel: Normal appearance of the stomach. There is no pathologic dilatation of small  bowel or colon. Numerous colonic diverticulae are noted, without surrounding inflammatory changes to suggest an acute diverticulitis at this time. Normal appendix. Vascular/Lymphatic: Aortic atherosclerosis, without evidence of aneurysm or dissection in the abdominal or pelvic vasculature. No lymphadenopathy noted in the abdomen or pelvis. Reproductive: Status post hysterectomy. Ovaries are not confidently identified may be surgically absent or atrophic. Other: Small umbilical hernia containing only omental fat. Small epigastric ventral hernia containing only omental fat. No significant volume of ascites. No pneumoperitoneum. Musculoskeletal: There are no aggressive appearing lytic or blastic lesions noted in the visualized portions of the skeleton. Postoperative changes of bilateral hip arthroplasty are noted. IMPRESSION: 1. 3.0 x 1.5 x 1.7 cm enhancing soft tissue nodule in the superior aspect of the right breast with several smaller surrounding nodules in the right breast and multiple borderline enlarged right axillary lymph nodes. In addition, there is evidence for widespread lymphangitic spread of disease throughout the lungs bilaterally, pleural involvement bilaterally, and metastatic lymphadenopathy in the mediastinal and hilar nodal stations bilaterally, as detailed above. 2. No definite evidence of infradiaphragmatic metastatic disease in the abdomen or pelvis. 3. 2.1 cm intermediate attenuation lesion in the upper pole of the right kidney is indeterminate. Statistically, this is likely to represent a small proteinaceous cyst. The possibility of a cystic neoplasm such as a papillary renal cell carcinoma is not excluded, however, and close attention on followup studies is recommended. 4. Hepatic steatosis. 5. Aortic atherosclerosis, in addition to 2 vessel coronary artery disease. Assessment for potential risk factor modification, dietary therapy or pharmacologic therapy may be warranted, if clinically  indicated. 6. Small epigastric ventral hernia and small umbilical hernia as a, both of which contain only omental fat. No findings to suggest associated bowel incarceration or obstruction. 7. Colonic diverticulosis without evidence of acute diverticulitis at this time. 8. Additional incidental findings, as above. Electronically Signed   By: Vinnie Langton M.D.   On: 03/10/2016 09:06   [place] Extrem Up Right Ltd  Result Date: 03/02/2016 CLINICAL DATA:  Recent diagnosis of right breast invasive ductal carcinoma at 2 sites in the right breast, at the 12 o'clock and 4 o'clock locations. On the breast MRI performed 02/21/2016, there were several prominent level 1 axillary lymph nodes, with thickened cortices. Second-look ultrasound is requested, to assess if ultrasound-guided biopsy can be performed of any of the potentially suspicious lymph nodes. EXAM: ULTRASOUND RIGHT UPPER EXTREMITY LIMITED TECHNIQUE: Ultrasound examination of the upper extremity soft tissues was performed in the area  of clinical concern. COMPARISON:  Recent bilateral mammogram, right breast ultrasound, right breast biopsies, and breast MRI. The breast MRI was performed 02/21/2016. FINDINGS: There are several level 1 right axillary lymph nodes with focal areas of cortical thickening. Cortical thickening measures from 5 to 7 mm. At least 3 lymph nodes with cortical thickening are identified on ultrasound. IMPRESSION: Suspicious right axillary lymph nodes. Ultrasound-guided biopsy will be performed of one of the lymph nodes today and dictated separately. Electronically Signed   By: Curlene Dolphin M.D.   On: 03/02/2016 15:30   [place] Rt Breast Bx W Loc Dev 1st Lesion Img Bx Spec US Guide  Addendum Date: 03/07/2016   ADDENDUM REPORT: 03/03/2016 15:42 ADDENDUM: Pathology revealed LYMPH NODE TISSUE WITH METASTATIC CARCINOMA of the Right axilla. This was found to be concordant by Dr. Curlene Dolphin. Pathology results were discussed with the patient by  telephone. The patient reported doing well after the biopsy with tenderness at the site. Post biopsy instructions and care were reviewed and questions were answered. The patient was encouraged to call [facility] for any additional concerns. The patient has a recent diagnosis of right breast cancer and should follow her outlined treatment plan. Pathology results reported by Terie Purser, RN on 03/03/2016. Electronically Signed   By: Curlene Dolphin M.D.   On: 03/03/2016 15:42   Result Date: 03/07/2016 CLINICAL DATA:  Suspicious right axillary lymph nodes in patient with recently diagnosed invasive ductal carcinoma in 2 separate quadrants of the right breast. EXAM: ULTRASOUND GUIDED CORE NEEDLE BIOPSY OF A RIGHT AXILLARY NODE COMPARISON:  Previous exam(s). FINDINGS: I met with the patient and we discussed the procedure of ultrasound-guided biopsy, including benefits and alternatives. We discussed the high likelihood of a successful procedure. We discussed the risks of the procedure, including infection, bleeding, tissue injury, clip migration, and inadequate sampling. Informed written consent was given. The usual time-out protocol was performed immediately prior to the procedure. Using sterile technique and 1% Lidocaine as local anesthetic, under direct ultrasound visualization, a 14 gauge spring-loaded device was used to perform biopsy of a level 1 axillary lymph node with cortical thickening using a lateral to medial approach. At the conclusion of the procedure a HydroMARK tissue marker clip was deployed into the biopsy cavity. The [facility] tissue marker clip is visible on the ultrasound images. IMPRESSION: Ultrasound guided biopsy of right axillary lymph node. No apparent complications. Electronically Signed: By: Curlene Dolphin M.D. On: 03/02/2016 15:31    ELIGIBLE FOR AVAILABLE RESEARCH PROTOCOL: no  ASSESSMENT: 70 y.o. [place] woman with a remote history of early stage  endometrial cancer, now status post right breast upper outer quadrant biopsy 01/22/2016 for a clinically multifocal T2 N0, stage 2A invasive ductal carcinoma, grade 1, estrogen and progesterone receptor positive, HER-2 negative, with an MIB-1 between 10 and 15%.  (1) right axillary lymph node biopsy 03/02/2016 positive  (2) genetics testing 01/13/2016 through the Custom gene panel offered by [facility] found no deleterious mutations in  ATM, BARD1, BRCA1, BRCA2, BRIP1, CDH1, CHEK2, EPCAM, FANCC, MLH1, MSH2, MSH6, MUTYH, NBN, PALB2, PMS2, POLD1, PTEN, RAD51C, RAD51D, TP53, and XRCC2  METASTATIC DISEASE: (3) CT scans of the chest abdomen and pelvis obtained 03/10/2016 are consistent with bilateral lung metastases and mediastinal and hilar nodal involvement, but no liver or bone spread  (a) EBUS 03/28/2016 confirms adenocarcinoma, estrogen receptor positive; cytologies also positive  (4) letrozole started 03/15/2016, palbociclib started 03/29/2016  PLAN: I spent approximately an hour today  with Janalyn and her husband going over her situation. What we do know is that she has stage IIB right-sided breast cancer, which is estrogen receptor and progesterone receptor positive but HER-2 not amplified.  What we suspect is that this cancer has spread to both her lungs including the lung parenchyma, the lining of the lungs, the area between the lungs, and the roots of both lungs. I showed her all the relevant scans.  She understands that while this looks very much like cancer, it could be an inflammatory or other condition. Even if what is in the long is cancer, it could be a different cancer, for example lymphoma.  Accordingly the abnormalities in the lung will have to be biopsied and I'm referring her to a pulmonologist to get that done as soon as possible.  In the meantime however I am starting her on breast cancer treatment. She understands that stage IV breast cancer is not curable. The goal is control. She  also understands that postponing her right breast surgery will not affect the ultimate results. It is very safe for her to wait on her surgery until we know what's going on and the rest of her body and can have a definitive treatment plan.  She will start letrozole today. We discussed the possible toxicities, side effects and complications of this agent and sometime before the year we will do a bone density scan.  We are also starting possible palbociclib. We will have to obtain this for her through our oral pharmacy support services. I put the order in today and hopefully within a week she will be able to start this.  She understands that I'll palbociclib, lower her white count and she will need to have her complete blood count checked initially on a weekly basis, later only on a monthly basis. We also discussed the other possible side effects toxicities and complications of this agent which include easy bruising and fatigue among others.  Tentatively I am scheduling her to return to see me the first weekend in November. We should have a definitive diagnosis of what ever is in her lungs by then. She knows to call for any problems that may develop before that visit.  Chauncey Cruel, MD   03/31/2016 9:05 AM Medical Oncology and Hematology [facility] [address] [place], [place] [address] Tel. [phone]    Fax. [phone]

## 2016-03-31 NOTE — Telephone Encounter (Signed)
This RN

## 2016-04-05 ENCOUNTER — Other Ambulatory Visit (HOSPITAL_BASED_OUTPATIENT_CLINIC_OR_DEPARTMENT_OTHER): Payer: Medicare Other

## 2016-04-05 DIAGNOSIS — C50411 Malignant neoplasm of upper-outer quadrant of right female breast: Secondary | ICD-10-CM | POA: Diagnosis present

## 2016-04-05 DIAGNOSIS — Z8542 Personal history of malignant neoplasm of other parts of uterus: Secondary | ICD-10-CM

## 2016-04-05 DIAGNOSIS — Z170 Estrogen receptor positive status [ER+]: Secondary | ICD-10-CM

## 2016-04-05 DIAGNOSIS — C7801 Secondary malignant neoplasm of right lung: Secondary | ICD-10-CM

## 2016-04-05 LAB — CBC WITH DIFFERENTIAL/PLATELET
BASO%: 0.4 % (ref 0.0–2.0)
Basophils Absolute: 0 10*3/uL (ref 0.0–0.1)
EOS%: 1.6 % (ref 0.0–7.0)
Eosinophils Absolute: 0.1 10*3/uL (ref 0.0–0.5)
HCT: 36.5 % (ref 34.8–46.6)
HGB: 11.9 g/dL (ref 11.6–15.9)
LYMPH%: 29 % (ref 14.0–49.7)
MCH: 26.7 pg (ref 25.1–34.0)
MCHC: 32.5 g/dL (ref 31.5–36.0)
MCV: 82.2 fL (ref 79.5–101.0)
MONO#: 0.1 10*3/uL (ref 0.1–0.9)
MONO%: 2.5 % (ref 0.0–14.0)
NEUT#: 3.3 10*3/uL (ref 1.5–6.5)
NEUT%: 66.5 % (ref 38.4–76.8)
Platelets: 212 10*3/uL (ref 145–400)
RBC: 4.44 10*6/uL (ref 3.70–5.45)
RDW: 14.9 % — ABNORMAL HIGH (ref 11.2–14.5)
WBC: 5 10*3/uL (ref 3.9–10.3)
lymph#: 1.5 10*3/uL (ref 0.9–3.3)

## 2016-04-05 LAB — COMPREHENSIVE METABOLIC PANEL
ALT: 12 U/L (ref 0–55)
AST: 10 U/L (ref 5–34)
Albumin: 3.4 g/dL — ABNORMAL LOW (ref 3.5–5.0)
Alkaline Phosphatase: 55 U/L (ref 40–150)
Anion Gap: 9 mEq/L (ref 3–11)
BUN: 13.5 mg/dL (ref 7.0–26.0)
CO2: 26 mEq/L (ref 22–29)
Calcium: 8.9 mg/dL (ref 8.4–10.4)
Chloride: 106 mEq/L (ref 98–109)
Creatinine: 1 mg/dL (ref 0.6–1.1)
EGFR: 60 mL/min/{1.73_m2} — ABNORMAL LOW (ref >90)
Glucose: 253 mg/dl — ABNORMAL HIGH (ref 70–140)
Potassium: 4.4 mEq/L (ref 3.5–5.1)
Sodium: 142 mEq/L (ref 136–145)
Total Bilirubin: 0.59 mg/dL (ref 0.20–1.20)
Total Protein: 6.9 g/dL (ref 6.4–8.3)

## 2016-04-06 ENCOUNTER — Ambulatory Visit: Payer: Medicare Other | Admitting: Oncology

## 2016-04-06 ENCOUNTER — Encounter: Payer: Self-pay | Admitting: Pulmonary Disease

## 2016-04-06 ENCOUNTER — Ambulatory Visit (INDEPENDENT_AMBULATORY_CARE_PROVIDER_SITE_OTHER): Payer: Medicare Other | Admitting: Pulmonary Disease

## 2016-04-06 DIAGNOSIS — R918 Other nonspecific abnormal finding of lung field: Secondary | ICD-10-CM

## 2016-04-06 DIAGNOSIS — G4733 Obstructive sleep apnea (adult) (pediatric): Secondary | ICD-10-CM | POA: Diagnosis not present

## 2016-04-06 NOTE — Patient Instructions (Signed)
It was a pleasure taking care of you!  Follow up as needed.   Make sure you follow up with your cancer doctor.

## 2016-04-06 NOTE — Assessment & Plan Note (Signed)
Patient has obstructive sleep apnea diagnosed several years ago. She was on CPAP therapy but was not compliant.

## 2016-04-06 NOTE — Assessment & Plan Note (Signed)
Patient was recently diagnosed with Breast CA in 02/2016, R breast.  Currently on chemotherapy.  Patient currently sees you in based on your note, she has clinically multifocal T2 No, stage 2A invasive ductal carcinoma, grade 1, estrogen and progesterone receptor positive, HER-2 negative. You  started her on chemotherapy.  As part of the workup for the malignancy, you ended up getting a chest and abdominal CT scan. In the chest CT scan, there was a 3 x 1.5 x 1.7 enhancing soft tissue nodule in the superior aspect of the right breast. She also had innumerable pulmonary nodules scattered throughout the lungs bilaterally, predominantly in a perilymphatic distribution, concerning for widespread lymphangitic spread. Nodules were sub-centimeters.  Patient also had multiple with borderline enlarged and mildly enlarged mediastinal and bilateral hilar lymph nodes measuring up to 16 mm. No other lung infiltrates or masses were seen.  Patient had bronchoscopy and EBUS Bx on 03/28/16. Stations 7 and 13 R were positive for adenocarcinoma, metastasis, breast primary.  Patient and husband aware of results. We discussed results again. She has been started on chemotherapy for this by her cancer doctor. She will follow up with her oncologist in a month.

## 2016-04-06 NOTE — Assessment & Plan Note (Signed)
Weight reduction 

## 2016-04-06 NOTE — Progress Notes (Signed)
Subjective:    Patient ID: [name], female    DOB: [birth year], 70 y.o.   MRN: [id number]  HPI   This is the case of [name], 70 y.o. Female, who was referred by Dr. Lurline Del  in consultation regarding Breast Ca with lung metastasis.   As you very well know, patient is a non smoker, not been diagnosed with any lung problems. Patient was recently diagnosed with Breast CA in 02/2016, R breast.  Currently on chemotherapy.  Patient currently sees you in based on your note, she has clinically multifocal T2 No, stage 2A invasive ductal carcinoma, grade 1, estrogen and progesterone receptor positive, HER-2 negative. You  started her on chemotherapy.  As part of the workup for the malignancy, you ended up getting a chest and abdominal CT scan. In the chest CT scan, there was a 3 x 1.5 x 1.7 enhancing soft tissue nodule in the superior aspect of the right breast. She also had innumerable pulmonary nodules scattered throughout the lungs bilaterally, predominantly in a perilymphatic distribution, concerning for widespread lymphangitic spread. Nodules were sub-centimeters.  Patient also had multiple with borderline enlarged and mildly enlarged mediastinal and bilateral hilar lymph nodes measuring up to 16 mm. No other lung infiltrates or masses were seen.  Patient is here for her abnormal lung CT scan.  Patient was diagnosed with sleep apnea years ago. She was on CPAP therapy. She stopped it since it was not making a difference.  ROV  04/06/16 Patient returns to the office as follow-up after her lung biopsy. Since last seen, she had a lung biopsy done with bronchoscopy with EBUS with Dr. Lamonte Sakai on October 16. Lymph node 7 and 13R were positive for metastatic adenocarcinoma, primary breast.  No issues after biopsy.    Review of Systems  Constitutional: Negative.  Negative for fever and unexpected weight change.  HENT: Negative for congestion, dental problem, ear pain, nosebleeds,  postnasal drip, rhinorrhea, sinus pressure, sneezing, sore throat and trouble swallowing.   Eyes: Negative.  Negative for redness and itching.  Respiratory: Positive for cough. Negative for chest tightness, shortness of breath and wheezing.   Cardiovascular: Negative.  Negative for palpitations and leg swelling.  Gastrointestinal: Negative.  Negative for nausea and vomiting.  Endocrine: Negative.   Genitourinary: Negative.  Negative for dysuria.  Musculoskeletal: Negative.  Negative for joint swelling.  Skin: Negative.  Negative for rash.  Allergic/Immunologic: Positive for environmental allergies.  Neurological: Positive for headaches.  Hematological: Negative.  Does not bruise/bleed easily.  Psychiatric/Behavioral: Negative.  Negative for dysphoric mood. The patient is not nervous/anxious.        Objective:   Physical Exam   Vitals:  Vitals:   04/06/16 1423  BP: 122/76  Pulse: 69  SpO2: 94%  Weight: 243 lb (110.2 kg)  Height: 5' 4" (1.626 m)    Constitutional/General:  Pleasant, well-nourished, well-developed, not in any distress,  Comfortably seating.  Well kempt  Body mass index is 41.71 kg/m. Wt Readings from Last 3 Encounters:  04/06/16 243 lb (110.2 kg)  03/28/16 242 lb (109.8 kg)  03/22/16 242 lb (109.8 kg)     HEENT: Pupils equal and reactive to light and accommodation. Anicteric sclerae. Normal nasal mucosa.   No oral  lesions,  mouth clear,  oropharynx clear, no postnasal drip. (-) Oral thrush. No dental caries.  Airway - Mallampati class III  Neck: No masses. Midline trachea. No JVD, (-) LAD. (-) bruits appreciated.  Respiratory/Chest: Grossly normal  chest. (-) deformity. (-) Accessory muscle use.  Symmetric expansion. (-) Tenderness on palpation.  Resonant on percussion.  Diminished BS on both lower lung zones. (-) wheezing, crackles, rhonchi (-) egophony  Cardiovascular: Regular rate and  rhythm, heart sounds normal, no murmur or gallops, no  peripheral edema  Gastrointestinal:  Normal bowel sounds. Soft, non-tender. No hepatosplenomegaly.  (-) masses.   Musculoskeletal:  Normal muscle tone. Normal gait.   Extremities: Grossly normal. (-) clubbing, cyanosis.  (-) edema  Skin: (-) rash,lesions seen.   Neurological/Psychiatric : alert, oriented to time, place, person. Normal mood and affect         Assessment & Plan:  Pulmonary nodules Patient was recently diagnosed with Breast CA in 02/2016, R breast.  Currently on chemotherapy.  Patient currently sees you in based on your note, she has clinically multifocal T2 No, stage 2A invasive ductal carcinoma, grade 1, estrogen and progesterone receptor positive, HER-2 negative. You  started her on chemotherapy.  As part of the workup for the malignancy, you ended up getting a chest and abdominal CT scan. In the chest CT scan, there was a 3 x 1.5 x 1.7 enhancing soft tissue nodule in the superior aspect of the right breast. She also had innumerable pulmonary nodules scattered throughout the lungs bilaterally, predominantly in a perilymphatic distribution, concerning for widespread lymphangitic spread. Nodules were sub-centimeters.  Patient also had multiple with borderline enlarged and mildly enlarged mediastinal and bilateral hilar lymph nodes measuring up to 16 mm. No other lung infiltrates or masses were seen.  Patient had bronchoscopy and EBUS Bx on 03/28/16. Stations 7 and 13 R were positive for adenocarcinoma, metastasis, breast primary.  Patient and husband aware of results. We discussed results again. She has been started on chemotherapy for this by her cancer doctor. She will follow up with her oncologist in a month.    Obstructive sleep apnea Patient has obstructive sleep apnea diagnosed several years ago. She was on CPAP therapy but was not compliant.   Morbid obesity ([place]) Weight reduction       Follow up as needed.     Monica Becton, MD 04/06/2016    2:49 PM Pulmonary and [place] Pager: [phone] Office: [phone], Fax: [phone]

## 2016-04-12 ENCOUNTER — Other Ambulatory Visit (HOSPITAL_BASED_OUTPATIENT_CLINIC_OR_DEPARTMENT_OTHER): Payer: Medicare Other

## 2016-04-12 DIAGNOSIS — C7801 Secondary malignant neoplasm of right lung: Secondary | ICD-10-CM

## 2016-04-12 DIAGNOSIS — C50411 Malignant neoplasm of upper-outer quadrant of right female breast: Secondary | ICD-10-CM | POA: Diagnosis present

## 2016-04-12 DIAGNOSIS — Z8542 Personal history of malignant neoplasm of other parts of uterus: Secondary | ICD-10-CM

## 2016-04-12 DIAGNOSIS — Z170 Estrogen receptor positive status [ER+]: Secondary | ICD-10-CM

## 2016-04-12 LAB — CBC WITH DIFFERENTIAL/PLATELET
BASO%: 0.2 % (ref 0.0–2.0)
Basophils Absolute: 0 10*3/uL (ref 0.0–0.1)
EOS%: 1 % (ref 0.0–7.0)
Eosinophils Absolute: 0 10*3/uL (ref 0.0–0.5)
HCT: 34.7 % — ABNORMAL LOW (ref 34.8–46.6)
HGB: 11.4 g/dL — ABNORMAL LOW (ref 11.6–15.9)
LYMPH%: 53.3 % — ABNORMAL HIGH (ref 14.0–49.7)
MCH: 26.9 pg (ref 25.1–34.0)
MCHC: 32.8 g/dL (ref 31.5–36.0)
MCV: 82 fL (ref 79.5–101.0)
MONO#: 0.1 10*3/uL (ref 0.1–0.9)
MONO%: 4.6 % (ref 0.0–14.0)
NEUT#: 1.1 10*3/uL — ABNORMAL LOW (ref 1.5–6.5)
NEUT%: 40.9 % (ref 38.4–76.8)
Platelets: 187 10*3/uL (ref 145–400)
RBC: 4.23 10*6/uL (ref 3.70–5.45)
RDW: 14.7 % — ABNORMAL HIGH (ref 11.2–14.5)
WBC: 2.7 10*3/uL — ABNORMAL LOW (ref 3.9–10.3)
lymph#: 1.5 10*3/uL (ref 0.9–3.3)

## 2016-04-12 LAB — COMPREHENSIVE METABOLIC PANEL
ALT: 14 U/L (ref 0–55)
AST: 10 U/L (ref 5–34)
Albumin: 3.5 g/dL (ref 3.5–5.0)
Alkaline Phosphatase: 58 U/L (ref 40–150)
Anion Gap: 8 mEq/L (ref 3–11)
BUN: 14.5 mg/dL (ref 7.0–26.0)
CO2: 30 mEq/L — ABNORMAL HIGH (ref 22–29)
Calcium: 9.3 mg/dL (ref 8.4–10.4)
Chloride: 104 mEq/L (ref 98–109)
Creatinine: 0.9 mg/dL (ref 0.6–1.1)
EGFR: 61 mL/min/{1.73_m2} — ABNORMAL LOW (ref >90)
Glucose: 169 mg/dl — ABNORMAL HIGH (ref 70–140)
Potassium: 4.2 mEq/L (ref 3.5–5.1)
Sodium: 142 mEq/L (ref 136–145)
Total Bilirubin: 0.49 mg/dL (ref 0.20–1.20)
Total Protein: 7 g/dL (ref 6.4–8.3)

## 2016-04-13 ENCOUNTER — Other Ambulatory Visit: Payer: Medicare Other

## 2016-04-18 ENCOUNTER — Other Ambulatory Visit (HOSPITAL_BASED_OUTPATIENT_CLINIC_OR_DEPARTMENT_OTHER): Payer: Medicare Other

## 2016-04-18 ENCOUNTER — Ambulatory Visit (HOSPITAL_BASED_OUTPATIENT_CLINIC_OR_DEPARTMENT_OTHER): Payer: Medicare Other | Admitting: Oncology

## 2016-04-18 VITALS — BP 180/74 | HR 66 | Temp 98.4°F | Resp 18 | Ht 64.0 in | Wt 241.7 lb

## 2016-04-18 DIAGNOSIS — C773 Secondary and unspecified malignant neoplasm of axilla and upper limb lymph nodes: Secondary | ICD-10-CM | POA: Diagnosis not present

## 2016-04-18 DIAGNOSIS — C7801 Secondary malignant neoplasm of right lung: Secondary | ICD-10-CM | POA: Diagnosis not present

## 2016-04-18 DIAGNOSIS — Z8542 Personal history of malignant neoplasm of other parts of uterus: Secondary | ICD-10-CM

## 2016-04-18 DIAGNOSIS — Z170 Estrogen receptor positive status [ER+]: Secondary | ICD-10-CM

## 2016-04-18 DIAGNOSIS — C50411 Malignant neoplasm of upper-outer quadrant of right female breast: Secondary | ICD-10-CM | POA: Diagnosis not present

## 2016-04-18 DIAGNOSIS — K760 Fatty (change of) liver, not elsewhere classified: Secondary | ICD-10-CM | POA: Diagnosis not present

## 2016-04-18 LAB — CBC WITH DIFFERENTIAL/PLATELET
BASO%: 0.5 % (ref 0.0–2.0)
Basophils Absolute: 0 10*3/uL (ref 0.0–0.1)
EOS%: 0.7 % (ref 0.0–7.0)
Eosinophils Absolute: 0 10*3/uL (ref 0.0–0.5)
HCT: 35.7 % (ref 34.8–46.6)
HGB: 11.7 g/dL (ref 11.6–15.9)
LYMPH%: 60.9 % — ABNORMAL HIGH (ref 14.0–49.7)
MCH: 27.1 pg (ref 25.1–34.0)
MCHC: 32.8 g/dL (ref 31.5–36.0)
MCV: 82.9 fL (ref 79.5–101.0)
MONO#: 0.1 10*3/uL (ref 0.1–0.9)
MONO%: 5.3 % (ref 0.0–14.0)
NEUT#: 0.9 10*3/uL — ABNORMAL LOW (ref 1.5–6.5)
NEUT%: 32.6 % — ABNORMAL LOW (ref 38.4–76.8)
Platelets: 137 10*3/uL — ABNORMAL LOW (ref 145–400)
RBC: 4.3 10*6/uL (ref 3.70–5.45)
RDW: 14.6 % — ABNORMAL HIGH (ref 11.2–14.5)
WBC: 2.8 10*3/uL — ABNORMAL LOW (ref 3.9–10.3)
lymph#: 1.7 10*3/uL (ref 0.9–3.3)

## 2016-04-18 LAB — COMPREHENSIVE METABOLIC PANEL
ALT: 13 U/L (ref 0–55)
AST: 11 U/L (ref 5–34)
Albumin: 3.8 g/dL (ref 3.5–5.0)
Alkaline Phosphatase: 64 U/L (ref 40–150)
Anion Gap: 7 mEq/L (ref 3–11)
BUN: 13.6 mg/dL (ref 7.0–26.0)
CO2: 29 mEq/L (ref 22–29)
Calcium: 9.4 mg/dL (ref 8.4–10.4)
Chloride: 106 mEq/L (ref 98–109)
Creatinine: 1.1 mg/dL (ref 0.6–1.1)
EGFR: 52 mL/min/{1.73_m2} — ABNORMAL LOW (ref >90)
Glucose: 170 mg/dl — ABNORMAL HIGH (ref 70–140)
Potassium: 4.4 mEq/L (ref 3.5–5.1)
Sodium: 142 mEq/L (ref 136–145)
Total Bilirubin: 0.54 mg/dL (ref 0.20–1.20)
Total Protein: 7.2 g/dL (ref 6.4–8.3)

## 2016-04-18 MED ORDER — PROCHLORPERAZINE MALEATE 10 MG PO TABS: 10.0000 mg | ORAL_TABLET | Freq: Four times a day (QID) | ORAL | 0 refills | Status: DC | PRN

## 2016-04-18 MED FILL — IBRANCE 125 MG CAPSULE: 125 | 28 days supply | Qty: 21 | Fill #1

## 2016-04-18 NOTE — Progress Notes (Signed)
[facility]  [phone] [phone]     ID: DIANEY SUCHY DOB: 10/11/1945  MR#: [id number]  QPY#:[id number]  Patient Care Team: Biagio Borg, MD as PCP - General Alphonsa Overall, MD as Consulting Physician (General Surgery) Chauncey Cruel, MD as Consulting Physician (Oncology) Kyung Rudd, MD as Consulting Physician (Radiation Oncology) Bobbye Charleston, MD as Consulting Physician (Obstetrics and Gynecology) Nada Libman, MD as Referring Physician (Specialist) Vevelyn Royals, MD as Consulting Physician (Ophthalmology) Collene Gobble, MD as Consulting Physician (Pulmonary Disease) OTHER MD:  CHIEF COMPLAINT: Estrogen receptor positive breast cancer  CURRENT TREATMENT: Letrozole, palbociclib   BREAST CANCER HISTORY: From the original intake note:  Terea had screening mammography showing some suspicious calcifications in the right breast leading to right diagnostic mammography with ultrasonography 01/22/2016 at [facility]. The breast density was category C. In the upper right breast there was a 2.3 cm mass with additional masses measuring 0.9 and 0.7 cm. There was also a possible additional 0.8 mass in the lower inner quadrant. Ultrasound confirmed an irregular hypoechoic mass in the right breast upper outer quadrant measuring 2.0 cm. There were other masses measuring 0.7 and 0.8 cm by ultrasonography. The right axilla was sonographically benign.  Biopsy of a 12:00 and 4:00 mass in the right breast 01/22/2016 showed ([id number]) both specimens showing invasive ductal carcinoma, grade 1 or 2, both 95% estrogen receptor positive, both 95% progesterone receptor positive, both with strong staining intensity, with MIB-1 ranging from 10-15%, and both HER-2 negative, the signals ratio being 1.23-1.42, and the number per cell 1.85-2.59.  Her subsequent history is as detailed below  INTERVAL HISTORY: Nadiya returns today for follow-up of her metastatic estrogen  receptor positive breast cancer accompanied by [name]. Since her last visit here she underwent fiberoptic bronchoscopy endoscopic ultrasound-guided biopsy 03/28/2016. There were no endobronchial lesions. 2 lymph nodes, station 7 and 13 R were biopsied and both showed metastatic adenocarcinoma ([id number]). The malignant cells were estrogen receptor positive, TTF-1-1 negative, and HER-2 negative by immunohistochemistry. This is consistent with recurrent metastatic estrogen receptor positive HER-2 negative breast cancer.  Today we reviewed her films and she was he able to appreciate the multiple small bilateral pulmonary nodules. Of course she also has the breast masses and regional adenopathy. There was no liver or bone involvement however  REVIEW OF SYSTEMS: Nicky tells me the EBUS went well and she appreciated Dr. Lamonte Sakai calling her with results. She had a negative interaction with a different pulmonologist and she has requested not to see that particular physician further. She has been taking letrozole with no obvious side effects, and obtained that for approximately $3. She started the palbociclib at 125 mg daily on 03/29/2016. She initially had no side effects from for the last week however she has had nausea and some soft bowel movements, even some mild diarrhea. She vomited twice in the last week. She is completing the 21 days of palbociclib today. Aside from these issues, she feels tired and she is taking naps in the middle of the day. She is not sleeping at night. She goes to bed at 11 PM, and wakes up several times with nocturia. She has a very minimal dry cough, no pleurisy, no hemoptysis, no purulent sputum. A detailed review of systems today was otherwise stable.  PAST MEDICAL HISTORY: Past Medical History:  Diagnosis Date  . Cancer ([place]) 02/2016   right breast  . DIABETES MELLITUS, TYPE II 01/04/2007  only takes actoplus daily  . Dizziness and giddiness 02/29/2008  .  DVT, HX OF    at age 70 in right buttocks  . Family history of breast cancer   . GERD 01/04/2007   pt reports resolved   . GLAUCOMA 07/30/2008   both eyes  . History of blood transfusion    no abnormal  reaction  . History of uterine cancer 2000   hysterectomy done  . HYPERLIPIDEMIA 01/04/2007   taking Pravastatin daily  . HYPERTENSION 01/04/2007   takes Lisinopril daily  . Joint pain   . Joint swelling   . LEG PAIN, LEFT 07/06/2007  . NUMBNESS 07/30/2008   in fingers;pt states from Diamox  . OSTEOARTHRITIS, HIP 09/25/2009  . OTITIS MEDIA, ACUTE, BILATERAL 02/29/2008  . Overweight(278.02) 01/04/2007  . PONV (postoperative nausea and vomiting)   . SLEEP APNEA, OBSTRUCTIVE    doesn't use a cpap;study done about 80yr ago  . TRANSIENT ISCHEMIC ATTACK, HX OF 01/04/2007  . Vision loss    left eye    PAST SURGICAL HISTORY: Past Surgical History:  Procedure Laterality Date  . ABDOMINAL HYSTERECTOMY  2000  . CHOLECYSTECTOMY    . ENDOBRONCHIAL ULTRASOUND Bilateral 03/28/2016   Procedure: ENDOBRONCHIAL ULTRASOUND;  Surgeon: RCollene Gobble MD;  Location: [facility];  Service: Cardiopulmonary;  Laterality: Bilateral;  . EYE SURGERY  13   shunt left and lazer eye surgery on right cataract and retenia tear with repair  . growth removal  2004   from thumb  . KNEE ARTHROSCOPY Right   . mulitple eye surgeries     both eyes, cataracts with ioc done both eyes  . OOPHORECTOMY    . TOTAL HIP ARTHROPLASTY  06/24/2011   Procedure: TOTAL HIP ARTHROPLASTY;  Surgeon: FKerin Salen  [facility]  Service: Orthopedics;  Laterality: Right;  . TOTAL HIP ARTHROPLASTY Left 11/12/2012   Dr RMayer Camel . TOTAL HIP ARTHROPLASTY Left 11/12/2012   Procedure: TOTAL HIP ARTHROPLASTY;  Surgeon: FKerin Salen MD;  [facility]  Service: Orthopedics;  Laterality: Left;  DEPUY PINNACLE    FAMILY HISTORY Family History  Problem Relation Age of Onset  . Dementia Mother   . Cancer Mother     Breast and  lung cancer  . Stroke Sister   . Breast cancer Sister 533 . Heart attack Maternal Aunt   . Lung cancer Maternal Grandmother     non smoker  . Glaucoma Maternal Grandfather   . Anesthesia problems Neg Hx   The patient's father died at age 70 the patient's mother died at age 62100 She had breast and lung cancers diagnosed shortly before her death. The patient had no brothers, 2 sisters. One sister was diagnosed with breast cancer at the age of 546  GYNECOLOGIC HISTORY:  No LMP recorded. Patient has had a hysterectomy. Menarche age 70 first live birth age 70 the patient is GX P1. She had a hysterectomy for endometrial cancer in the year 2000. She did not take hormone replacement. She did use oral contraceptives for your than 20 years remotely, with no complications.  SOCIAL HISTORY:  BMaiyais retired. She is home with her husband [name] Their son CJuanda [place]also lives in [place] currently between jobs.    ADVANCED DIRECTIVES: In place  HEALTH MAINTENANCE: Social History  Substance Use Topics  . Smoking status: Never Smoker  . Smokeless tobacco: Never Used  . Alcohol use No     Colonoscopy: Never  PAP: Status  post hysterectomy  Bone density: Remote   Allergies  Allergen Reactions  . Atorvastatin Other (See Comments)    Leg cramp  . Fluorescein Nausea And Vomiting    ? IV dye for retina specialist  . Lipitor [Atorvastatin Calcium]     Leg cramp  . Oxycodone Nausea And Vomiting    Patient vomited for 3 days after taking  . Sitagliptin Phosphate Nausea And Vomiting  . Sulfa Drugs Cross Reactors Nausea And Vomiting    Current Outpatient Prescriptions  Medication Sig Dispense Refill  . aspirin 81 MG tablet Take 81 mg by mouth daily. Reported on 06/12/2015    . bimatoprost (LUMIGAN) 0.01 % SOLN 1 drop nightly.    . brimonidine-timolol (COMBIGAN) 0.2-0.5 % ophthalmic solution Place 1 drop into both eyes tid    . letrozole (FEMARA) 2.5 MG tablet Take 1 tablet (2.5 mg total)  by mouth at bedtime.    Marland Kitchen lisinopril-hydrochlorothiazide (PRINZIDE,ZESTORETIC) 20-12.5 MG tablet TAKE ONE TABLET BY MOUTH EVERY DAY    . lovastatin (MEVACOR) 20 MG tablet Take 20 mg by mouth every evening.     . palbociclib (IBRANCE) 125 MG capsule Take 1 capsule (125 mg total) by mouth daily with breakfast. Take whole with food. 21 capsule 6  . pioglitazone-metformin (ACTOPLUS MET) 15-500 MG tablet Take 15-500 tablets by mouth daily.    . prednisoLONE acetate (PRED FORTE) 1 % ophthalmic suspension 1 drop every morning.     . prochlorperazine (COMPAZINE) 10 MG tablet Take 1 tablet (10 mg total) by mouth every 6 (six) hours as needed for nausea or vomiting. 30 tablet 0   No current facility-administered medications for this visit.     OBJECTIVE: Middle-aged white woman Who appears stated age 75:   04/18/16 1500  BP: (!) 180/74  Pulse: 66  Resp: 18  Temp: 98.4 F (36.9 C)     Body mass index is 41.49 kg/m.    ECOG FS:1 - Symptomatic but completely ambulatory  Sclerae unicteric, EOMs intact Oropharynx clear and moist No cervical or supraclavicular adenopathy Lungs no rales or rhonchi Heart regular rate and rhythm Abd soft, nontender, positive bowel sounds MSK no focal spinal tenderness, no upper extremity lymphedema Neuro: nonfocal, well oriented, appropriate affect Breasts: Deferred   LAB RESULTS:  CMP     Component Value Date/Time   NA 142 04/18/2016 1432   K 4.4 04/18/2016 1432   CL 106 04/29/2015 1730   CO2 29 04/18/2016 1432   GLUCOSE 170 (H) 04/18/2016 1432   BUN 13.6 04/18/2016 1432   CREATININE 1.1 04/18/2016 1432   CALCIUM 9.4 04/18/2016 1432   PROT 7.2 04/18/2016 1432   ALBUMIN 3.8 04/18/2016 1432   AST 11 04/18/2016 1432   ALT 13 04/18/2016 1432   ALKPHOS 64 04/18/2016 1432   BILITOT 0.54 04/18/2016 1432   GFRNONAA >90 11/13/2012 0430   GFRAA >90 11/13/2012 0430    INo results found for: SPEP, UPEP  Lab Results  Component Value Date   WBC 2.8  (L) 04/18/2016   NEUTROABS 0.9 (L) 04/18/2016   HGB 11.7 04/18/2016   HCT 35.7 04/18/2016   MCV 82.9 04/18/2016   PLT 137 (L) 04/18/2016      Chemistry      Component Value Date/Time   NA 142 04/18/2016 1432   K 4.4 04/18/2016 1432   CL 106 04/29/2015 1730   CO2 29 04/18/2016 1432   BUN 13.6 04/18/2016 1432   CREATININE 1.1 04/18/2016 1432  Component Value Date/Time   CALCIUM 9.4 04/18/2016 1432   ALKPHOS 64 04/18/2016 1432   AST 11 04/18/2016 1432   ALT 13 04/18/2016 1432   BILITOT 0.54 04/18/2016 1432       No results found for: LABCA2  No components found for: LABCA125  No results for input(s): INR in the last 168 hours.  Urinalysis    Component Value Date/Time   COLORURINE YELLOW 10/30/2014 1513   APPEARANCEUR CLEAR 10/30/2014 1513   LABSPEC 1.010 10/30/2014 1513   PHURINE 7.0 10/30/2014 1513   GLUCOSEU NEGATIVE 10/30/2014 1513   HGBUR NEGATIVE 10/30/2014 1513   BILIRUBINUR NEGATIVE 10/30/2014 1513   KETONESUR NEGATIVE 10/30/2014 1513   PROTEINUR NEGATIVE 11/08/2012 1319   UROBILINOGEN 0.2 10/30/2014 1513   NITRITE NEGATIVE 10/30/2014 1513   LEUKOCYTESUR NEGATIVE 10/30/2014 1513     STUDIES:  CLINICAL DATA:  70 year old female with history of right-sided breast cancer diagnosed in August 2017. Additional history of uterine cancer diagnosed in 2000.  EXAM: CT CHEST, ABDOMEN, AND PELVIS WITH CONTRAST  TECHNIQUE: Multidetector CT imaging of the chest, abdomen and pelvis was performed following the standard protocol during bolus administration of intravenous contrast.  CONTRAST:  190m ISOVUE-300 IOPAMIDOL (ISOVUE-300) INJECTION 61%  COMPARISON:  None.  FINDINGS: CT CHEST FINDINGS  Cardiovascular: Heart size is normal. There is no significant pericardial fluid, thickening or pericardial calcification. There is aortic atherosclerosis, as well as atherosclerosis of the great vessels of the mediastinum and the coronary arteries,  including calcified atherosclerotic plaque in the left anterior descending and left circumflex coronary arteries.  Mediastinum/Nodes: Multiple with borderline enlarged and mildly enlarged mediastinal and bilateral hilar lymph nodes measuring up to 16 mm in short axis in the subcarinal nodal station. Esophagus is unremarkable in appearance. Multiple prominent borderline enlarged right axillary lymph nodes measuring up to 8 mm in short axis. No left axillary lymphadenopathy.  Lungs/Pleura: There are innumerable pulmonary nodules scattered throughout the lungs bilaterally, predominantly in a perilymphatic distribution, highly concerning for widespread lymphangitic spread of disease. The largest pulmonary nodules include a subpleural 7 x 11 mm nodule in the anterior aspect of the right lower lobe (image 71 of series 5), and a subpleural 11 x 7 mm nodule in the inferior aspect of the left lower lobe (image 98 of series 5). Extensive pleural thickening and nodularity is noted bilaterally (right greater than left). No pleural effusions.  Musculoskeletal: In the superior aspect of the right breast there are several enhancing soft tissue nodules, the largest of which measures 3.0 x 1.5 x 1.7 cm (axial image 20 of series 2 and coronal image 56 of series 602). There are no aggressive appearing lytic or blastic lesions noted in the visualized portions of the skeleton.  CT ABDOMEN PELVIS FINDINGS  Hepatobiliary: Diffuse low attenuation throughout the hepatic parenchyma, compatible with a background of hepatic steatosis. No cystic or solid hepatic lesions. Status post cholecystectomy. Mild intrahepatic and moderate extrahepatic biliary ductal dilatation, favored to reflect benign post cholecystectomy physiology. No definite calcified stone identified in the common bile duct.  Pancreas: No pancreatic mass. No pancreatic ductal dilatation. No pancreatic or peripancreatic fluid or  inflammatory changes.  Spleen: Unremarkable.  Adrenals/Urinary Tract: In the upper pole of the right kidney there is a 2.1 cm intermediate attenuation (30 HU) lesion which is otherwise simple in appearance. Left kidney and right adrenal gland are normal in appearance. In the lateral limb of the left adrenal gland there is a 1 cm indeterminate nodule. No hydroureteronephrosis.  Urinary bladder is partially obscured by extensive beam hardening artifact from the patient's bilateral hip arthroplasties, but the visualized portions of the urinary bladder are unremarkable in appearance.  Stomach/Bowel: Normal appearance of the stomach. There is no pathologic dilatation of small bowel or colon. Numerous colonic diverticulae are noted, without surrounding inflammatory changes to suggest an acute diverticulitis at this time. Normal appendix.  Vascular/Lymphatic: Aortic atherosclerosis, without evidence of aneurysm or dissection in the abdominal or pelvic vasculature. No lymphadenopathy noted in the abdomen or pelvis.  Reproductive: Status post hysterectomy. Ovaries are not confidently identified may be surgically absent or atrophic.  Other: Small umbilical hernia containing only omental fat. Small epigastric ventral hernia containing only omental fat. No significant volume of ascites. No pneumoperitoneum.  Musculoskeletal: There are no aggressive appearing lytic or blastic lesions noted in the visualized portions of the skeleton. Postoperative changes of bilateral hip arthroplasty are noted.  IMPRESSION: 1. 3.0 x 1.5 x 1.7 cm enhancing soft tissue nodule in the superior aspect of the right breast with several smaller surrounding nodules in the right breast and multiple borderline enlarged right axillary lymph nodes. In addition, there is evidence for widespread lymphangitic spread of disease throughout the lungs bilaterally, pleural involvement bilaterally, and metastatic  lymphadenopathy in the mediastinal and hilar nodal stations bilaterally, as detailed above. 2. No definite evidence of infradiaphragmatic metastatic disease in the abdomen or pelvis. 3. 2.1 cm intermediate attenuation lesion in the upper pole of the right kidney is indeterminate. Statistically, this is likely to represent a small proteinaceous cyst. The possibility of a cystic neoplasm such as a papillary renal cell carcinoma is not excluded, however, and close attention on followup studies is recommended. 4. Hepatic steatosis. 5. Aortic atherosclerosis, in addition to 2 vessel coronary artery disease. Assessment for potential risk factor modification, dietary therapy or pharmacologic therapy may be warranted, if clinically indicated. 6. Small epigastric ventral hernia and small umbilical hernia as a, both of which contain only omental fat. No findings to suggest associated bowel incarceration or obstruction. 7. Colonic diverticulosis without evidence of acute diverticulitis at this time. 8. Additional incidental findings, as above.   Electronically Signed   By: Vinnie Langton M.D.   On: 03/10/2016 09:06     ELIGIBLE FOR AVAILABLE RESEARCH PROTOCOL: no  ASSESSMENT: 70 y.o. [place] woman with a remote history of early stage endometrial cancer, now status post right breast upper outer quadrant biopsy 01/22/2016 for a clinically multifocal T2 N0, stage 2A invasive ductal carcinoma, grade 1, estrogen and progesterone receptor positive, HER-2 negative, with an MIB-1 between 10 and 15%.  (1) right axillary lymph node biopsy 03/02/2016 positive  (2) genetics testing 01/13/2016 through the Custom gene panel offered by [facility] found no deleterious mutations in  ATM, BARD1, BRCA1, BRCA2, BRIP1, CDH1, CHEK2, EPCAM, FANCC, MLH1, MSH2, MSH6, MUTYH, NBN, PALB2, PMS2, POLD1, PTEN, RAD51C, RAD51D, TP53, and XRCC2  METASTATIC DISEASE: OCT 2017 (3) CT scans of the chest abdomen and  pelvis obtained 03/10/2016 are consistent with bilateral lung metastases and mediastinal and hilar nodal involvement, but no liver or bone spread  (a) bronchoscopic lymph node biopsy 2 (station 7, 13R) 03/28/2016 confirms metastatic adenocarcinoma, estrogen receptor positive, HER-2 not amplified  (4) letrozole started 03/15/2016, palbociclib added 03/29/2016  PLAN: I spent approximately an hour today with Inez Catalina and her husband going over her situation. She understands that stage IV breast cancer is not curable with our current knowledge base. The goal of treatment is control. The strategy of treatment  is to do only the minimum necessary to control the growth of the tumor so that the patient can have as normal a life as possible. There is no survival advantage in treating aggressively if treating less aggressively results in tumor control. With this strategy stage IV breast cancer in many cases can function as a "chronic illness": something that cannot be quite gotten rid of but can be controlled for an indefinite period of time  There are always 3 questions to go over and so we reviewed those today. The first question is do we treat or not. In some cases the patient's overall situation is so discouraging that palliative/comfort care alone is appropriate. If the decision is made to treat, then the next question is whether anti-estrogens or chemotherapy is more appropriate. Once that decision is made than the third question is: Which agent or combination of agents in particular should be used?  Rikita is very clear that she desires treatment and she has a very good functional status and normal hepatic and renal function. Next question is whether we should go with anti-estrogens versus chemotherapy and given the fact that we don't have immediately life-threatening disease anti-estrogens are preferred. We then discussed at length the possible toxicities, side effects and complications of both letrozole and  palbociclib  Palbociclib certainly can cause diarrhea and nausea. However she did not experience this the first 2 weeks of treatment and there is a "virus" going around. Accordingly we will see how she does with her second cycle of palbociclib, which will be starting 04/26/2016, before making a definitive decision whether or not to continue that medication. In the meantime she is being prescribed Compazine to use as needed.  She is appropriately concerned that letrozole may cause bone density loss. Accordingly she will have a bone density sometime later this year and if there is osteopenia we will proceed most likely to zolendronate twice yearly.  She is interested in undergoing right modified radical mastectomy at some point in the future. She understands we have to document a good response to systemic therapy first. She had concerns regarding how long she will live in she is aware that the average survival from this point is in the 2-3 year range, but that some patients do much better on others of course much worse.  Given the fact that her [place] did drop to the current 0.9, were going to be checking her CBC on a weekly basis for the next 2 months until we have a stable palbociclib dose.  Nasiya has a good understanding of the overall plan. She agrees with it. She will see me again 05/16/2016 but she knows to call for any problems that may develop before that visit.    Chauncey Cruel, MD   04/18/2016 6:37 PM Medical Oncology and Hematology [facility] [address] [place], [place] [address] Tel. [phone]    Fax. [phone]

## 2016-04-18 NOTE — Addendum Note (Signed)
Addended by: Chauncey Cruel on: 04/18/2016 06:48 PM   Modules accepted: Orders, SmartSet

## 2016-04-19 LAB — CANCER ANTIGEN 27.29: CA 27.29: 137.5 U/mL — ABNORMAL HIGH (ref 0.0–38.6)

## 2016-04-21 ENCOUNTER — Ambulatory Visit (INDEPENDENT_AMBULATORY_CARE_PROVIDER_SITE_OTHER): Payer: Medicare Other | Admitting: Internal Medicine

## 2016-04-21 ENCOUNTER — Telehealth: Payer: Self-pay | Admitting: Oncology

## 2016-04-21 ENCOUNTER — Encounter: Payer: Self-pay | Admitting: Internal Medicine

## 2016-04-21 VITALS — BP 140/80 | HR 64 | Resp 20 | Wt 241.1 lb

## 2016-04-21 DIAGNOSIS — E785 Hyperlipidemia, unspecified: Secondary | ICD-10-CM

## 2016-04-21 DIAGNOSIS — E119 Type 2 diabetes mellitus without complications: Secondary | ICD-10-CM | POA: Diagnosis not present

## 2016-04-21 DIAGNOSIS — I10 Essential (primary) hypertension: Secondary | ICD-10-CM

## 2016-04-21 DIAGNOSIS — F418 Other specified anxiety disorders: Secondary | ICD-10-CM

## 2016-04-21 MED ORDER — PIOGLITAZONE HCL-METFORMIN HCL 15-500 MG PO TABS: 15.0000 | ORAL_TABLET | Freq: Every day | ORAL | 3 refills | Status: DC

## 2016-04-21 MED ORDER — LOVASTATIN 20 MG PO TABS: 20.0000 mg | ORAL_TABLET | Freq: Every evening | ORAL | 3 refills | Status: DC

## 2016-04-21 NOTE — Telephone Encounter (Signed)
sw pt to confirm lab/MD appt date times per LOS

## 2016-04-21 NOTE — Progress Notes (Signed)
Subjective:    Patient ID: [name], female    DOB: September 12, 1945, 70 y.o.   MRN: [id number]  HPI   Here for yearly f/u;  Overall doing ok;  Pt denies Chest pain, worsening SOB, DOE, wheezing, orthopnea, PND, worsening LE edema, palpitations, dizziness or syncope.  Pt denies neurological change such as new headache, facial or extremity weakness.  Pt denies polydipsia, polyuria, or low sugar symptoms. Pt states overall good compliance with treatment and medications, good tolerability, and has been trying to follow appropriate diet.  Pt denies worsening depressive symptoms, suicidal ideation or panic. No fever, night sweats, wt loss, loss of appetite, or other constitutional symptoms.  Pt states good ability with ADL's, has low fall risk, home safety reviewed and adequate, no other significant changes in hearing or vision, followed per optho for glaucoma frequently, and only occasionally active with exercise.  No other changes to hx.  Declines immunizations today.  S/p tx for uveitis x 4 per optho, completed 4 mo valtrex per pt.  Peak wt has been 250  , not trying to lose wt recently., per oncology due to current stage 4 breast ca   Wt has been Nov 2016 wt 236. Pt with hx of TIA, to stay on the lovastatin, but does have nightly or os foot cramps, despite leg elevation in chair at night. Does not want muscle relaxer Wt Readings from Last 3 Encounters:  04/21/16 241 lb 2 oz (109.4 kg)  04/18/16 241 lb 11.2 oz (109.6 kg)  04/06/16 243 lb (110.2 kg)   Past Medical History:  Diagnosis Date  . Cancer ([place]) 02/2016   right breast  . DIABETES MELLITUS, TYPE II 01/04/2007   only takes actoplus daily  . Dizziness and giddiness 02/29/2008  . DVT, HX OF    at age 89 in right buttocks  . Family history of breast cancer   . GERD 01/04/2007   pt reports resolved   . GLAUCOMA 07/30/2008   both eyes  . History of blood transfusion    no abnormal  reaction  . History of uterine cancer 2000   hysterectomy done   . HYPERLIPIDEMIA 01/04/2007   taking Pravastatin daily  . HYPERTENSION 01/04/2007   takes Lisinopril daily  . Joint pain   . Joint swelling   . LEG PAIN, LEFT 07/06/2007  . NUMBNESS 07/30/2008   in fingers;pt states from Diamox  . OSTEOARTHRITIS, HIP 09/25/2009  . OTITIS MEDIA, ACUTE, BILATERAL 02/29/2008  . Overweight(278.02) 01/04/2007  . PONV (postoperative nausea and vomiting)   . SLEEP APNEA, OBSTRUCTIVE    doesn't use a cpap;study done about 69yrs ago  . TRANSIENT ISCHEMIC ATTACK, HX OF 01/04/2007  . Vision loss    left eye   Past Surgical History:  Procedure Laterality Date  . ABDOMINAL HYSTERECTOMY  2000  . CHOLECYSTECTOMY    . ENDOBRONCHIAL ULTRASOUND Bilateral 03/28/2016   Procedure: ENDOBRONCHIAL ULTRASOUND;  Surgeon: Collene Gobble, MD;  Location: [facility];  Service: Cardiopulmonary;  Laterality: Bilateral;  . EYE SURGERY  13   shunt left and lazer eye surgery on right cataract and retenia tear with repair  . growth removal  2004   from thumb  . KNEE ARTHROSCOPY Right   . mulitple eye surgeries     both eyes, cataracts with ioc done both eyes  . OOPHORECTOMY    . TOTAL HIP ARTHROPLASTY  06/24/2011   Procedure: TOTAL HIP ARTHROPLASTY;  Surgeon: Kerin Salen;  Location: [facility];  Service: Orthopedics;  Laterality: Right;  . TOTAL HIP ARTHROPLASTY Left 11/12/2012   Dr Mayer Camel  . TOTAL HIP ARTHROPLASTY Left 11/12/2012   Procedure: TOTAL HIP ARTHROPLASTY;  Surgeon: Kerin Salen, MD;  Location: [place];  Service: Orthopedics;  Laterality: Left;  DEPUY PINNACLE    reports that she has never smoked. She has never used smokeless tobacco. She reports that she does not drink alcohol or use drugs. family history includes Breast cancer (age of onset: 62) in her sister; Cancer in her mother; Dementia in her mother; Glaucoma in her maternal grandfather; Heart attack in her maternal aunt; Lung cancer in her maternal grandmother; Stroke in her sister. Allergies  Allergen Reactions  .  Atorvastatin Other (See Comments)    Leg cramp  . Fluorescein Nausea And Vomiting    ? IV dye for retina specialist  . Lipitor [Atorvastatin Calcium]     Leg cramp  . Oxycodone Nausea And Vomiting    Patient vomited for 3 days after taking  . Sitagliptin Phosphate Nausea And Vomiting  . Sulfa Drugs Cross Reactors Nausea And Vomiting   Current Outpatient Prescriptions on File Prior to Visit  Medication Sig Dispense Refill  . aspirin 81 MG tablet Take 81 mg by mouth daily. Reported on 06/12/2015    . bimatoprost (LUMIGAN) 0.01 % SOLN 1 drop nightly.    . brimonidine-timolol (COMBIGAN) 0.2-0.5 % ophthalmic solution Place 1 drop into both eyes tid    . letrozole (FEMARA) 2.5 MG tablet Take 1 tablet (2.5 mg total) by mouth at bedtime.    Marland Kitchen lisinopril-hydrochlorothiazide (PRINZIDE,ZESTORETIC) 20-12.5 MG tablet TAKE ONE TABLET BY MOUTH EVERY DAY    . palbociclib (IBRANCE) 125 MG capsule Take 1 capsule (125 mg total) by mouth daily with breakfast. Take whole with food. 21 capsule 6  . prednisoLONE acetate (PRED FORTE) 1 % ophthalmic suspension 1 drop every morning.     . prochlorperazine (COMPAZINE) 10 MG tablet Take 1 tablet (10 mg total) by mouth every 6 (six) hours as needed for nausea or vomiting. 30 tablet 0   No current facility-administered medications on file prior to visit.    Review of Systems  Constitutional: Negative for unusual diaphoresis or night sweats HENT: Negative for ear swelling or discharge Eyes: Negative for worsening visual haziness  Respiratory: Negative for choking and stridor.   Gastrointestinal: Negative for distension or worsening eructation Genitourinary: Negative for retention or change in urine volume.  Musculoskeletal: Negative for other MSK pain or swelling Skin: Negative for color change and worsening wound Neurological: Negative for tremors and numbness other than noted  Psychiatric/Behavioral: Negative for decreased concentration or agitation other than  above   All other system neg per pt    Objective:   Physical Exam BP 140/80   Pulse 64   Resp 20   Wt 241 lb 2 oz (109.4 kg)   SpO2 97%   BMI 41.39 kg/m  VS noted,  Constitutional: Pt appears in no apparent distress HENT: Head: NCAT.  Right Ear: External ear normal.  Left Ear: External ear normal.  Eyes: . Pupils are equal, round, and reactive to light. Conjunctivae and EOM are normal Neck: Normal range of motion. Neck supple.  Cardiovascular: Normal rate and regular rhythm.   Pulmonary/Chest: Effort normal and breath sounds without rales or wheezing.  Abd:  Soft, NT, ND, +BS Neurological: Pt is alert. Not confused , motor grossly intact Skin: Skin is warm. No rash, no LE edema Psychiatric: Pt behavior is normal.  No agitation. not depressed affect No other new exam findings    Assessment & Plan:

## 2016-04-21 NOTE — Patient Instructions (Signed)
Your A1c today:  Please continue all other medications as before, and refills have been done if requested.  Please have the pharmacy call with any other refills you may need.  Please continue your efforts at being more active, low cholesterol diet, and weight control.  You are otherwise up to date with prevention measures today.  Please keep your appointments with your specialists as you may have planned  Please return in 6 months, or sooner if needed, with Lab testing done 3-5 days before

## 2016-04-21 NOTE — Progress Notes (Signed)
Pre visit review using our clinic review tool, if applicable. No additional management support is needed unless otherwise documented below in the visit note. 

## 2016-04-22 ENCOUNTER — Other Ambulatory Visit: Payer: Self-pay | Admitting: Internal Medicine

## 2016-04-23 NOTE — Assessment & Plan Note (Signed)
stable overall by history and exam, recent data reviewed with pt, and pt to continue medical treatment as before,  to f/u any worsening symptoms or concerns Lab Results  Component Value Date   LDLCALC 117 (H) 04/29/2015

## 2016-04-23 NOTE — Assessment & Plan Note (Signed)
stable overall by history and exam, recent data reviewed with pt, and pt to continue medical treatment as before,  to f/u any worsening symptoms or concerns BP Readings from Last 3 Encounters:  04/21/16 140/80  04/18/16 (!) 180/74  04/06/16 122/76

## 2016-04-23 NOTE — Assessment & Plan Note (Signed)
stable overall by history and exam, recent data reviewed with pt, and pt to continue medical treatment as before,  to f/u any worsening symptoms or concerns Lab Results  Component Value Date   WBC 2.8 (L) 04/18/2016   HGB 11.7 04/18/2016   HCT 35.7 04/18/2016   PLT 137 (L) 04/18/2016   GLUCOSE 170 (H) 04/18/2016   CHOL 195 04/29/2015   TRIG 129.0 04/29/2015   HDL 52.40 04/29/2015   LDLCALC 117 (H) 04/29/2015   ALT 13 04/18/2016   AST 11 04/18/2016   NA 142 04/18/2016   K 4.4 04/18/2016   CL 106 04/29/2015   CREATININE 1.1 04/18/2016   BUN 13.6 04/18/2016   CO2 29 04/18/2016   TSH 1.18 10/30/2014   INR 1.06 11/08/2012   HGBA1C 7.3 (H) 04/29/2015   MICROALBUR <0.7 10/30/2014

## 2016-04-23 NOTE — Assessment & Plan Note (Signed)
stable overall by history and exam, recent data reviewed with pt, and pt to continue medical treatment as before,  to f/u any worsening symptoms or concerns Lab Results  Component Value Date   HGBA1C 7.3 (H) 04/29/2015

## 2016-04-26 ENCOUNTER — Other Ambulatory Visit (HOSPITAL_BASED_OUTPATIENT_CLINIC_OR_DEPARTMENT_OTHER): Payer: Medicare Other

## 2016-04-26 DIAGNOSIS — K760 Fatty (change of) liver, not elsewhere classified: Secondary | ICD-10-CM

## 2016-04-26 DIAGNOSIS — C7801 Secondary malignant neoplasm of right lung: Secondary | ICD-10-CM

## 2016-04-26 DIAGNOSIS — C50411 Malignant neoplasm of upper-outer quadrant of right female breast: Secondary | ICD-10-CM

## 2016-04-26 DIAGNOSIS — Z8542 Personal history of malignant neoplasm of other parts of uterus: Secondary | ICD-10-CM

## 2016-04-26 DIAGNOSIS — Z170 Estrogen receptor positive status [ER+]: Secondary | ICD-10-CM

## 2016-04-26 LAB — COMPREHENSIVE METABOLIC PANEL
ALT: 26 U/L (ref 0–55)
AST: 13 U/L (ref 5–34)
Albumin: 3.6 g/dL (ref 3.5–5.0)
Alkaline Phosphatase: 63 U/L (ref 40–150)
Anion Gap: 8 mEq/L (ref 3–11)
BUN: 9.9 mg/dL (ref 7.0–26.0)
CO2: 26 mEq/L (ref 22–29)
Calcium: 9 mg/dL (ref 8.4–10.4)
Chloride: 105 mEq/L (ref 98–109)
Creatinine: 0.9 mg/dL (ref 0.6–1.1)
EGFR: 65 mL/min/{1.73_m2} — ABNORMAL LOW (ref >90)
Glucose: 214 mg/dl — ABNORMAL HIGH (ref 70–140)
Potassium: 4.1 mEq/L (ref 3.5–5.1)
Sodium: 140 mEq/L (ref 136–145)
Total Bilirubin: 0.42 mg/dL (ref 0.20–1.20)
Total Protein: 6.7 g/dL (ref 6.4–8.3)

## 2016-04-26 LAB — CBC WITH DIFFERENTIAL/PLATELET
BASO%: 0.5 % (ref 0.0–2.0)
Basophils Absolute: 0 10*3/uL (ref 0.0–0.1)
EOS%: 0.6 % (ref 0.0–7.0)
Eosinophils Absolute: 0 10*3/uL (ref 0.0–0.5)
HCT: 33.7 % — ABNORMAL LOW (ref 34.8–46.6)
HGB: 11.2 g/dL — ABNORMAL LOW (ref 11.6–15.9)
LYMPH%: 60.6 % — ABNORMAL HIGH (ref 14.0–49.7)
MCH: 28.2 pg (ref 25.1–34.0)
MCHC: 33.3 g/dL (ref 31.5–36.0)
MCV: 84.7 fL (ref 79.5–101.0)
MONO#: 0.3 10*3/uL (ref 0.1–0.9)
MONO%: 12.6 % (ref 0.0–14.0)
NEUT#: 0.7 10*3/uL — ABNORMAL LOW (ref 1.5–6.5)
NEUT%: 25.7 % — ABNORMAL LOW (ref 38.4–76.8)
Platelets: 210 10*3/uL (ref 145–400)
RBC: 3.98 10*6/uL (ref 3.70–5.45)
RDW: 16.3 % — ABNORMAL HIGH (ref 11.2–14.5)
WBC: 2.6 10*3/uL — ABNORMAL LOW (ref 3.9–10.3)
lymph#: 1.6 10*3/uL (ref 0.9–3.3)

## 2016-04-28 ENCOUNTER — Telehealth: Payer: Self-pay | Admitting: Emergency Medicine

## 2016-04-28 NOTE — Telephone Encounter (Signed)
Patient called with concerns about her ANC of 0.7. Instructed patient that plan is to hold Ibrance until [place] 1.5; Patient will return next week as scheduled for labs. Instructed patient to call for any signs or symptoms of fever; or other concerns. Patient verbalized understanding. Instructed patient on good hand washing and limited contact with crowds.

## 2016-05-03 ENCOUNTER — Other Ambulatory Visit (HOSPITAL_BASED_OUTPATIENT_CLINIC_OR_DEPARTMENT_OTHER): Payer: Medicare Other

## 2016-05-03 DIAGNOSIS — C50411 Malignant neoplasm of upper-outer quadrant of right female breast: Secondary | ICD-10-CM | POA: Diagnosis present

## 2016-05-03 LAB — CBC WITH DIFFERENTIAL/PLATELET
BASO%: 0.9 % (ref 0.0–2.0)
Basophils Absolute: 0 10*3/uL (ref 0.0–0.1)
EOS%: 0.6 % (ref 0.0–7.0)
Eosinophils Absolute: 0 10*3/uL (ref 0.0–0.5)
HCT: 35.2 % (ref 34.8–46.6)
HGB: 11.8 g/dL (ref 11.6–15.9)
LYMPH%: 43.2 % (ref 14.0–49.7)
MCH: 28.1 pg (ref 25.1–34.0)
MCHC: 33.4 g/dL (ref 31.5–36.0)
MCV: 84 fL (ref 79.5–101.0)
MONO#: 0.5 10*3/uL (ref 0.1–0.9)
MONO%: 13.6 % (ref 0.0–14.0)
NEUT#: 1.6 10*3/uL (ref 1.5–6.5)
NEUT%: 41.7 % (ref 38.4–76.8)
Platelets: 284 10*3/uL (ref 145–400)
RBC: 4.19 10*6/uL (ref 3.70–5.45)
RDW: 20.2 % — ABNORMAL HIGH (ref 11.2–14.5)
WBC: 3.8 10*3/uL — ABNORMAL LOW (ref 3.9–10.3)
lymph#: 1.7 10*3/uL (ref 0.9–3.3)

## 2016-05-04 ENCOUNTER — Telehealth: Payer: Self-pay | Admitting: *Deleted

## 2016-05-04 NOTE — Telephone Encounter (Signed)
This RN spoke with pt per lab results obtained yesterday - with ANC of 1.6.  [name] will restart Ibrance 125 mg today.  Scheduled for labs and MD follow up.

## 2016-05-10 ENCOUNTER — Other Ambulatory Visit: Payer: Self-pay | Admitting: *Deleted

## 2016-05-10 ENCOUNTER — Other Ambulatory Visit (HOSPITAL_BASED_OUTPATIENT_CLINIC_OR_DEPARTMENT_OTHER): Payer: Medicare Other

## 2016-05-10 ENCOUNTER — Ambulatory Visit (HOSPITAL_COMMUNITY)
Admission: RE | Admit: 2016-05-10 | Discharge: 2016-05-10 | Disposition: A | Discharge status: Home / Self Care | Payer: Medicare Other | Source: Ambulatory Visit | Attending: Oncology | Admitting: Oncology

## 2016-05-10 ENCOUNTER — Telehealth: Payer: Self-pay | Admitting: *Deleted

## 2016-05-10 DIAGNOSIS — C50411 Malignant neoplasm of upper-outer quadrant of right female breast: Secondary | ICD-10-CM

## 2016-05-10 DIAGNOSIS — R059 Cough, unspecified: Secondary | ICD-10-CM

## 2016-05-10 DIAGNOSIS — Z170 Estrogen receptor positive status [ER+]: Secondary | ICD-10-CM | POA: Diagnosis not present

## 2016-05-10 DIAGNOSIS — R918 Other nonspecific abnormal finding of lung field: Secondary | ICD-10-CM | POA: Insufficient documentation

## 2016-05-10 DIAGNOSIS — K760 Fatty (change of) liver, not elsewhere classified: Secondary | ICD-10-CM

## 2016-05-10 DIAGNOSIS — C7801 Secondary malignant neoplasm of right lung: Secondary | ICD-10-CM | POA: Diagnosis not present

## 2016-05-10 DIAGNOSIS — R05 Cough: Secondary | ICD-10-CM | POA: Insufficient documentation

## 2016-05-10 DIAGNOSIS — Z8542 Personal history of malignant neoplasm of other parts of uterus: Secondary | ICD-10-CM

## 2016-05-10 LAB — COMPREHENSIVE METABOLIC PANEL
ALT: 17 U/L (ref 0–55)
AST: 11 U/L (ref 5–34)
Albumin: 3.6 g/dL (ref 3.5–5.0)
Alkaline Phosphatase: 57 U/L (ref 40–150)
Anion Gap: 8 mEq/L (ref 3–11)
BUN: 14.1 mg/dL (ref 7.0–26.0)
CO2: 29 mEq/L (ref 22–29)
Calcium: 9.5 mg/dL (ref 8.4–10.4)
Chloride: 104 mEq/L (ref 98–109)
Creatinine: 0.9 mg/dL (ref 0.6–1.1)
EGFR: 63 mL/min/{1.73_m2} — ABNORMAL LOW (ref >90)
Glucose: 197 mg/dl — ABNORMAL HIGH (ref 70–140)
Potassium: 4.3 mEq/L (ref 3.5–5.1)
Sodium: 141 mEq/L (ref 136–145)
Total Bilirubin: 0.44 mg/dL (ref 0.20–1.20)
Total Protein: 7 g/dL (ref 6.4–8.3)

## 2016-05-10 LAB — CBC WITH DIFFERENTIAL/PLATELET
BASO%: 0.6 % (ref 0.0–2.0)
Basophils Absolute: 0 10*3/uL (ref 0.0–0.1)
EOS%: 2 % (ref 0.0–7.0)
Eosinophils Absolute: 0.1 10*3/uL (ref 0.0–0.5)
HCT: 35.3 % (ref 34.8–46.6)
HGB: 11.9 g/dL (ref 11.6–15.9)
LYMPH%: 29.7 % (ref 14.0–49.7)
MCH: 28.5 pg (ref 25.1–34.0)
MCHC: 33.7 g/dL (ref 31.5–36.0)
MCV: 84.8 fL (ref 79.5–101.0)
MONO#: 0.2 10*3/uL (ref 0.1–0.9)
MONO%: 4 % (ref 0.0–14.0)
NEUT#: 2.8 10*3/uL (ref 1.5–6.5)
NEUT%: 63.7 % (ref 38.4–76.8)
Platelets: 317 10*3/uL (ref 145–400)
RBC: 4.16 10*6/uL (ref 3.70–5.45)
RDW: 20.3 % — ABNORMAL HIGH (ref 11.2–14.5)
WBC: 4.5 10*3/uL (ref 3.9–10.3)
lymph#: 1.3 10*3/uL (ref 0.9–3.3)

## 2016-05-10 MED ORDER — HYDROCODONE-HOMATROPINE 5-1.5 MG/5ML PO SYRP: 5.0000 mL | ORAL_SOLUTION | Freq: Four times a day (QID) | ORAL | 0 refills | Status: DC | PRN

## 2016-05-10 NOTE — Telephone Encounter (Signed)
Received call from patient stating she is on her way here for labs but has had a cough for the past week without fever.  She also complains of sore throat.  She is coughing up a little clear mucous.  She has taken Robitussin without relief.  Instructed her to wait after her labs and ask to see Val desk nurse for further instructions.  She may need a chest xray.  Message given to desk nurse.

## 2016-05-13 ENCOUNTER — Encounter: Payer: Self-pay | Admitting: Nurse Practitioner

## 2016-05-13 ENCOUNTER — Telehealth: Payer: Self-pay | Admitting: *Deleted

## 2016-05-13 ENCOUNTER — Ambulatory Visit (HOSPITAL_BASED_OUTPATIENT_CLINIC_OR_DEPARTMENT_OTHER): Payer: Medicare Other | Admitting: Nurse Practitioner

## 2016-05-13 VITALS — BP 154/61 | HR 77 | Temp 98.2°F | Resp 18 | Ht 64.0 in | Wt 245.2 lb

## 2016-05-13 DIAGNOSIS — C50411 Malignant neoplasm of upper-outer quadrant of right female breast: Secondary | ICD-10-CM

## 2016-05-13 DIAGNOSIS — J40 Bronchitis, not specified as acute or chronic: Secondary | ICD-10-CM | POA: Diagnosis not present

## 2016-05-13 DIAGNOSIS — R21 Rash and other nonspecific skin eruption: Secondary | ICD-10-CM | POA: Insufficient documentation

## 2016-05-13 DIAGNOSIS — C7800 Secondary malignant neoplasm of unspecified lung: Secondary | ICD-10-CM

## 2016-05-13 MED ORDER — LEVOFLOXACIN 500 MG PO TABS: 500.0000 mg | ORAL_TABLET | Freq: Every day | ORAL | 0 refills | Status: DC

## 2016-05-13 MED ORDER — ALBUTEROL SULFATE HFA 108 (90 BASE) MCG/ACT IN AERS: 1.0000 | INHALATION_SPRAY | Freq: Four times a day (QID) | RESPIRATORY_TRACT | 2 refills | Status: DC | PRN

## 2016-05-13 NOTE — Telephone Encounter (Signed)
This RN returned VM left by pt this AM stating ongoing cough not better since initiating hycodan cough syrup.  " now my throat is getting sore mainly in the AM and late at night "  Shawnika states " and now my head and face are itching "  Per phone discussion plan is for pt to come in to symptom management - note pt had labs and CXR earlier this week.  Time given to pt as well as Urgent inbox request sent for scheduling.  Diagnosis - metastatic breast cancer with pulmonary mets on Ibrance 125mg  cycle 2

## 2016-05-13 NOTE — Progress Notes (Signed)
SYMPTOM MANAGEMENT CLINIC    Chief Complaint: Bronchitis  HPI:  [name] 70 y.o. female diagnosed with breast cancer with lung metastasis.  Patient is currently undergoing letrozole and Ibrance oral therapy.     No history exists.    Review of Systems  Constitutional: Positive for malaise/fatigue and weight loss.  Respiratory: Positive for cough, sputum production, shortness of breath and wheezing.   Skin: Positive for itching and rash.  All other systems reviewed and are negative.   Past Medical History:  Diagnosis Date  . Cancer ([place]) 02/2016   right breast  . DIABETES MELLITUS, TYPE II 01/04/2007   only takes actoplus daily  . Dizziness and giddiness 02/29/2008  . DVT, HX OF    at age 89 in right buttocks  . Family history of breast cancer   . GERD 01/04/2007   pt reports resolved   . GLAUCOMA 07/30/2008   both eyes  . History of blood transfusion    no abnormal  reaction  . History of uterine cancer 2000   hysterectomy done  . HYPERLIPIDEMIA 01/04/2007   taking Pravastatin daily  . HYPERTENSION 01/04/2007   takes Lisinopril daily  . Joint pain   . Joint swelling   . LEG PAIN, LEFT 07/06/2007  . NUMBNESS 07/30/2008   in fingers;pt states from Diamox  . OSTEOARTHRITIS, HIP 09/25/2009  . OTITIS MEDIA, ACUTE, BILATERAL 02/29/2008  . Overweight(278.02) 01/04/2007  . PONV (postoperative nausea and vomiting)   . SLEEP APNEA, OBSTRUCTIVE    doesn't use a cpap;study done about 22yr ago  . TRANSIENT ISCHEMIC ATTACK, HX OF 01/04/2007  . Vision loss    left eye    Past Surgical History:  Procedure Laterality Date  . ABDOMINAL HYSTERECTOMY  2000  . CHOLECYSTECTOMY    . ENDOBRONCHIAL ULTRASOUND Bilateral 03/28/2016   Procedure: ENDOBRONCHIAL ULTRASOUND;  Surgeon: RCollene Gobble MD;  Location[facility];  Service: Cardiopulmonary;  Laterality: Bilateral;  . EYE SURGERY  13   shunt left and lazer eye surgery on right cataract and retenia tear with repair  .  growth removal  2004   from thumb  . KNEE ARTHROSCOPY Right   . mulitple eye surgeries     both eyes, cataracts with ioc done both eyes  . OOPHORECTOMY    . TOTAL HIP ARTHROPLASTY  06/24/2011   Procedure: TOTAL HIP ARTHROPLASTY;  Surgeon: FKerin Salen  [place]  Service: Orthopedics;  Laterality: Right;  . TOTAL HIP ARTHROPLASTY Left 11/12/2012   Dr RMayer Camel . TOTAL HIP ARTHROPLASTY Left 11/12/2012   Procedure: TOTAL HIP ARTHROPLASTY;  Surgeon: FKerin Salen MD;  [place]  Service: Orthopedics;  Laterality: Left;  DEPUY PINNACLE    has Diabetes ([place]; Hyperlipidemia; Depression with anxiety; Obstructive sleep apnea; GLAUCOMA; Essential hypertension; ASTHMA; GERD; OSTEOARTHRITIS, HIP; LEG PAIN, LEFT; Dizziness and giddiness; NUMBNESS; TRANSIENT ISCHEMIC ATTACK, HX OF; DVT, HX OF; Encounter for well adult exam with abnormal findings; Degenerative arthritis of hip; Osteoarthritis of left hip; Retinal vein occlusion, branch, left; Breast cancer of upper-outer quadrant of right female breast ([place]; Morbid obesity ([place]; Family history of breast cancer; History of uterine cancer; Genetic testing; Lung metastases ([place]; Pulmonary nodules; Mediastinal lymphadenopathy; Hepatic steatosis; Rash; and Bronchitis on her problem list.    is allergic to atorvastatin; codeine; fluorescein; lipitor [atorvastatin calcium]; oxycodone; sitagliptin phosphate; and sulfa drugs cross reactors.    Medication List       Accurate as of 05/13/16  4:51  PM. Always use your most recent med list.          albuterol 108 (90 Base) MCG/ACT inhaler Commonly known as:  PROVENTIL HFA;VENTOLIN HFA Inhale 1-2 puffs into the lungs every 6 (six) hours as needed for wheezing or shortness of breath.   aspirin 81 MG tablet Take 81 mg by mouth daily. Reported on 06/12/2015   bimatoprost 0.01 % Soln Commonly known as:  LUMIGAN 1 drop nightly.   brimonidine-timolol 0.2-0.5 % ophthalmic solution Commonly known as:   COMBIGAN Place 1 drop into both eyes tid   HYDROcodone-homatropine 5-1.5 MG/5ML syrup Commonly known as:  HYCODAN Take 5 mLs by mouth every 6 (six) hours as needed for cough.   letrozole 2.5 MG tablet Commonly known as:  FEMARA Take 1 tablet (2.5 mg total) by mouth at bedtime.   levofloxacin 500 MG tablet Commonly known as:  LEVAQUIN Take 1 tablet (500 mg total) by mouth daily.   lisinopril-hydrochlorothiazide 20-12.5 MG tablet Commonly known as:  PRINZIDE,ZESTORETIC TAKE ONE TABLET BY MOUTH EVERY DAY   lisinopril-hydrochlorothiazide 20-12.5 MG tablet Commonly known as:  PRINZIDE,ZESTORETIC TAKE ONE TABLET BY MOUTH ONCE DAILY   lovastatin 20 MG tablet Commonly known as:  MEVACOR Take 1 tablet (20 mg total) by mouth every evening.   palbociclib 125 MG capsule Commonly known as:  IBRANCE Take 1 capsule (125 mg total) by mouth daily with breakfast. Take whole with food.   pioglitazone-metformin 15-500 MG tablet Commonly known as:  ACTOPLUS MET Take 15-500 tablets by mouth daily.   prednisoLONE acetate 1 % ophthalmic suspension Commonly known as:  PRED FORTE 1 drop every morning.   prochlorperazine 10 MG tablet Commonly known as:  COMPAZINE Take 1 tablet (10 mg total) by mouth every 6 (six) hours as needed for nausea or vomiting.        PHYSICAL EXAMINATION  Oncology Vitals 05/13/2016 04/21/2016  Height 163 cm -  Weight 111.222 kg 109.374 kg  Weight (lbs) 245 lbs 3 oz 241 lbs 2 oz  BMI (kg/m2) 42.09 kg/m2 41.39 kg/m2  Temp 98.2 -  Pulse 77 64  Resp 18 20  SpO2 99 97  BSA (m2) 2.24 m2 2.22 m2   BP Readings from Last 2 Encounters:  05/13/16 (!) 154/61  04/21/16 140/80    Physical Exam  Constitutional: She is oriented to person, place, and time and well-developed, well-nourished, and in no distress.  HENT:  Head: Normocephalic and atraumatic.  Mouth/Throat: Oropharynx is clear and moist.  Eyes: Conjunctivae and EOM are normal. Pupils are equal, round, and  reactive to light. Right eye exhibits no discharge. Left eye exhibits no discharge. No scleral icterus.  Neck: Normal range of motion. Neck supple. No JVD present. No tracheal deviation present. No thyromegaly present.  Cardiovascular: Normal rate, regular rhythm, normal heart sounds and intact distal pulses.   Pulmonary/Chest: Effort normal. No respiratory distress. She has wheezes. She has rales. She exhibits no tenderness.  Abdominal: Soft. Bowel sounds are normal. She exhibits no distension and no mass. There is no tenderness. There is no rebound and no guarding.  Musculoskeletal: Normal range of motion. She exhibits no edema or tenderness.  Lymphadenopathy:    She has no cervical adenopathy.  Neurological: She is alert and oriented to person, place, and time. Gait normal.  Skin: Skin is warm and dry. Rash noted. No erythema. No pallor.  Psychiatric: Affect normal.  Nursing note and vitals reviewed.   LABORATORY DATA:. No visits with results within 3 Day(s)  from this visit.  Latest known visit with results is:  Appointment on 05/10/2016  Component Date Value Ref Range Status  . WBC 05/10/2016 4.5  3.9 - 10.3 10e3/uL Final  . NEUT# 05/10/2016 2.8  1.5 - 6.5 10e3/uL Final  . HGB 05/10/2016 11.9  11.6 - 15.9 g/dL Final  . HCT 05/10/2016 35.3  34.8 - 46.6 % Final  . Platelets 05/10/2016 317  145 - 400 10e3/uL Final  . MCV 05/10/2016 84.8  79.5 - 101.0 fL Final  . MCH 05/10/2016 28.5  25.1 - 34.0 pg Final  . MCHC 05/10/2016 33.7  31.5 - 36.0 g/dL Final  . RBC 05/10/2016 4.16  3.70 - 5.45 10e6/uL Final  . RDW 05/10/2016 20.3* 11.2 - 14.5 % Final  . lymph# 05/10/2016 1.3  0.9 - 3.3 10e3/uL Final  . MONO# 05/10/2016 0.2  0.1 - 0.9 10e3/uL Final  . Eosinophils Absolute 05/10/2016 0.1  0.0 - 0.5 10e3/uL Final  . Basophils Absolute 05/10/2016 0.0  0.0 - 0.1 10e3/uL Final  . NEUT% 05/10/2016 63.7  38.4 - 76.8 % Final  . LYMPH% 05/10/2016 29.7  14.0 - 49.7 % Final  . MONO% 05/10/2016 4.0   0.0 - 14.0 % Final  . EOS% 05/10/2016 2.0  0.0 - 7.0 % Final  . BASO% 05/10/2016 0.6  0.0 - 2.0 % Final  . Sodium 05/10/2016 141  136 - 145 mEq/L Final  . Potassium 05/10/2016 4.3  3.5 - 5.1 mEq/L Final  . Chloride 05/10/2016 104  98 - 109 mEq/L Final  . CO2 05/10/2016 29  22 - 29 mEq/L Final  . Glucose 05/10/2016 197* 70 - 140 mg/dl Final  . BUN 05/10/2016 14.1  7.0 - 26.0 mg/dL Final  . Creatinine 05/10/2016 0.9  0.6 - 1.1 mg/dL Final  . Total Bilirubin 05/10/2016 0.44  0.20 - 1.20 mg/dL Final  . Alkaline Phosphatase 05/10/2016 57  40 - 150 U/L Final  . AST 05/10/2016 11  5 - 34 U/L Final  . ALT 05/10/2016 17  0 - 55 U/L Final  . Total Protein 05/10/2016 7.0  6.4 - 8.3 g/dL Final  . Albumin 05/10/2016 3.6  3.5 - 5.0 g/dL Final  . Calcium 05/10/2016 9.5  8.4 - 10.4 mg/dL Final  . Anion Gap 05/10/2016 8  3 - 11 mEq/L Final  . EGFR 05/10/2016 63* >90 ml/min/1.73 m2 Final    RADIOGRAPHIC STUDIES: Dg Chest 2 View  Result Date: 05/10/2016 CLINICAL DATA:  Right side lung cancer.  Cough. EXAM: CHEST  2 VIEW COMPARISON:  CT 03/10/2016.  Chest x-ray 04/29/2015 FINDINGS: Previously seen nodular densities in both lower lungs by CT not appreciable by plain film. No confluent airspace opacity or effusion. Heart is normal size. IMPRESSION: No acute findings. Previously seen nodular areas in both lower lungs by CT not appreciable by plain film. Electronically Signed   By: Rolm Baptise M.D.   On: 05/10/2016 11:31    ASSESSMENT/PLAN:    Rash Patient states that she began taking Hycodan cough syrup 2 days ago; it has now developed a mild rash and itching to her face.  She denies any other hypersensitivity reaction symptoms whatsoever.  Exam today reveals trace red rash and some questionable hives to both sides of patient .  He patient is actively scratching at her face.  She has no other symptoms of allergic reaction.  Patient was advised that she could quite possibly be allergic to Hycodan cough  syrup.  She states that in  the past.  She has had an allergic reaction to codeine in the past.  Patient was advised to hold any further Hycodan cough syrup and will also place codeine on patient's allergy list.  She was given both verbal and written instructions regarding the use of both Benadryl and Pepcid for treatment of the rash.  She is also advised she could use hydrocortisone cream to her face as well.  She was advised to call/return or go directly to the emergency department for any worsening symptoms whatsoever.  Bronchitis Patient states that she has had a congested cough, wheezes, and low-grade fever intermittently for the past several days.  She has also heard wheezing.  She states that her cough.  Secretions are sometimes yellow.  She has tried Hycodan cough syrup with minimal effectiveness.  She has developed a rash as well.  See further notes regarding the rash.  Exam today reveals wheezing to all lung fields; and patient actively coughing.  She appears to have some cough spasms as well.  It is noted.  The patient had labs and a chest x-ray obtained on 05/10/2016 which revealed no acute findings.  Patient will be treated for bronchitis with Levaquin antibiotics and an albuterol inhaler.  She was also advised she could try over-the-counter Mucinex.  She was advised to hold any further Hycodan cough syrup and to try Robitussin Cough syrup instead if needed.  Patient was fasted call/return or go directly to the emergency department for any worsening symptoms whatsoever.  Breast cancer of upper-outer quadrant of right female breast [facility]) Patient continues with lectures all oral therapy; as well as Ibrance oral therapy as directed.  Patient is scheduled to return on 05/16/2016 for labs and a follow-up visit.   Patient stated understanding of all instructions; and was in agreement with this plan of care. The patient knows to call the clinic with any problems, questions or concerns.    Total time spent with patient was 25 minutes;  with greater than 75 percent of that time spent in face to face counseling regarding patient's symptoms,  and coordination of care and follow up.  Disclaimer:This dictation was prepared with Dragon/digital dictation along with [employer]. Any transcriptional errors that result from this process are unintentional.  Drue Second, NP 05/13/2016

## 2016-05-13 NOTE — Assessment & Plan Note (Signed)
Patient continues with lectures all oral therapy; as well as Ibrance oral therapy as directed.  Patient is scheduled to return on 05/16/2016 for labs and a follow-up visit.

## 2016-05-13 NOTE — Assessment & Plan Note (Signed)
Patient states that she has had a congested cough, wheezes, and low-grade fever intermittently for the past several days.  She has also heard wheezing.  She states that her cough.  Secretions are sometimes yellow.  She has tried Hycodan cough syrup with minimal effectiveness.  She has developed a rash as well.  See further notes regarding the rash.  Exam today reveals wheezing to all lung fields; and patient actively coughing.  She appears to have some cough spasms as well.  It is noted.  The patient had labs and a chest x-ray obtained on 05/10/2016 which revealed no acute findings.  Patient will be treated for bronchitis with Levaquin antibiotics and an albuterol inhaler.  She was also advised she could try over-the-counter Mucinex.  She was advised to hold any further Hycodan cough syrup and to try Robitussin Cough syrup instead if needed.  Patient was fasted call/return or go directly to the emergency department for any worsening symptoms whatsoever.

## 2016-05-13 NOTE — Assessment & Plan Note (Signed)
Patient states that she began taking Hycodan cough syrup 2 days ago; it has now developed a mild rash and itching to her face.  She denies any other hypersensitivity reaction symptoms whatsoever.  Exam today reveals trace red rash and some questionable hives to both sides of patient .  He patient is actively scratching at her face.  She has no other symptoms of allergic reaction.  Patient was advised that she could quite possibly be allergic to Hycodan cough syrup.  She states that in the past.  She has had an allergic reaction to codeine in the past.  Patient was advised to hold any further Hycodan cough syrup and will also place codeine on patient's allergy list.  She was given both verbal and written instructions regarding the use of both Benadryl and Pepcid for treatment of the rash.  She is also advised she could use hydrocortisone cream to her face as well.  She was advised to call/return or go directly to the emergency department for any worsening symptoms whatsoever.

## 2016-05-16 ENCOUNTER — Encounter: Payer: Self-pay | Admitting: Emergency Medicine

## 2016-05-16 ENCOUNTER — Other Ambulatory Visit (HOSPITAL_BASED_OUTPATIENT_CLINIC_OR_DEPARTMENT_OTHER): Payer: Medicare Other

## 2016-05-16 ENCOUNTER — Ambulatory Visit (HOSPITAL_BASED_OUTPATIENT_CLINIC_OR_DEPARTMENT_OTHER): Payer: Medicare Other | Admitting: Oncology

## 2016-05-16 VITALS — BP 168/68 | HR 63 | Temp 98.1°F | Resp 17 | Ht 64.0 in | Wt 243.3 lb

## 2016-05-16 DIAGNOSIS — Z8542 Personal history of malignant neoplasm of other parts of uterus: Secondary | ICD-10-CM | POA: Diagnosis not present

## 2016-05-16 DIAGNOSIS — C7801 Secondary malignant neoplasm of right lung: Secondary | ICD-10-CM | POA: Diagnosis not present

## 2016-05-16 DIAGNOSIS — C773 Secondary and unspecified malignant neoplasm of axilla and upper limb lymph nodes: Secondary | ICD-10-CM | POA: Diagnosis not present

## 2016-05-16 DIAGNOSIS — C50411 Malignant neoplasm of upper-outer quadrant of right female breast: Secondary | ICD-10-CM

## 2016-05-16 DIAGNOSIS — C7802 Secondary malignant neoplasm of left lung: Secondary | ICD-10-CM

## 2016-05-16 DIAGNOSIS — Z170 Estrogen receptor positive status [ER+]: Secondary | ICD-10-CM

## 2016-05-16 LAB — CBC WITH DIFFERENTIAL/PLATELET
BASO%: 0.4 % (ref 0.0–2.0)
Basophils Absolute: 0 10*3/uL (ref 0.0–0.1)
EOS%: 1.1 % (ref 0.0–7.0)
Eosinophils Absolute: 0 10*3/uL (ref 0.0–0.5)
HCT: 33.9 % — ABNORMAL LOW (ref 34.8–46.6)
HGB: 11.2 g/dL — ABNORMAL LOW (ref 11.6–15.9)
LYMPH%: 44 % (ref 14.0–49.7)
MCH: 28.3 pg (ref 25.1–34.0)
MCHC: 33.1 g/dL (ref 31.5–36.0)
MCV: 85.6 fL (ref 79.5–101.0)
MONO#: 0.2 10*3/uL (ref 0.1–0.9)
MONO%: 6.6 % (ref 0.0–14.0)
NEUT#: 1.7 10*3/uL (ref 1.5–6.5)
NEUT%: 47.9 % (ref 38.4–76.8)
Platelets: 315 10*3/uL (ref 145–400)
RBC: 3.96 10*6/uL (ref 3.70–5.45)
RDW: 21.5 % — ABNORMAL HIGH (ref 11.2–14.5)
WBC: 3.5 10*3/uL — ABNORMAL LOW (ref 3.9–10.3)
lymph#: 1.5 10*3/uL (ref 0.9–3.3)

## 2016-05-16 NOTE — Progress Notes (Signed)
Per Dr Virgie Dad request, Canceled CT scheduled for 1/18 and called [place]'s office and canceled Dr Corrie Dandy appointment on 1/26. Per Dr Corrie Dandy office patient can call and reschedule at her convenience.

## 2016-05-16 NOTE — Progress Notes (Signed)
[facility]  Telephone:([phone]) [phone] Fax:(336) [phone]     ID: TYLIAH SCHLERETH DOB: [birth year]  MR#: [id number]  BJS#:[id number]  Patient Care Team: Biagio Borg, MD as PCP - General Alphonsa Overall, MD as Consulting Physician (General Surgery) Chauncey Cruel, MD as Consulting Physician (Oncology) Kyung Rudd, MD as Consulting Physician (Radiation Oncology) Bobbye Charleston, MD as Consulting Physician (Obstetrics and Gynecology) Nada Libman, MD as Referring Physician (Specialist) Vevelyn Royals, MD as Consulting Physician (Ophthalmology) Collene Gobble, MD as Consulting Physician (Pulmonary Disease) OTHER MD:  CHIEF COMPLAINT: Estrogen receptor positive breast cancer  CURRENT TREATMENT: Letrozole, palbociclib   BREAST CANCER HISTORY: From the original intake note:  [place] had screening mammography showing some suspicious calcifications in the right breast leading to right diagnostic mammography with ultrasonography 01/22/2016 at [facility]. The breast density was category C. In the upper right breast there was a 2.3 cm mass with additional masses measuring 0.9 and 0.7 cm. There was also a possible additional 0.8 mass in the lower inner quadrant. Ultrasound confirmed an irregular hypoechoic mass in the right breast upper outer quadrant measuring 2.0 cm. There were other masses measuring 0.7 and 0.8 cm by ultrasonography. The right axilla was sonographically benign.  Biopsy of a 12:00 and 4:00 mass in the right breast 01/22/2016 showed ([id number]) both specimens showing invasive ductal carcinoma, grade 1 or 2, both 95% estrogen receptor positive, both 95% progesterone receptor positive, both with strong staining intensity, with MIB-1 ranging from 10-15%, and both HER-2 negative, the signals ratio being 1.23-1.42, and the number per cell 1.85-2.59.  Her subsequent history is as detailed below  INTERVAL HISTORY: Lanyiah returns today for follow-up of her estrogen receptor  positive breast cancer accompanied by [name]. She is being treated with letrozole and palbociclib. She tolerates the letrozole well, without unusual hot flashes, vaginal dryness problems, arthralgias or myalgias. She obtains it at a good price.  She is currently on day 17 of cycle 2 of the palbociclib. She feels a little tired, although she is not sure whether this is due to the palbociclib or not. She is taking a nap in the middle of the morning. This seems to be disturbing her nighttime sleep which then of course makes her sleepy during the day. She is not having any other side effects from this medication that she is aware of.  REVIEW OF SYSTEMS: Emine had an episode of persistent bronchitis which brought her to our symptom management clinic where she was prescribed Levaquin. Within 2 days of starting the antibiotics the symptoms cleared and she is already feeling much better. She still has a runny nose, a little bit of hoarseness, and a little bit of a dry cough. She is not short of breath, has no pleurisy or hemoptysis, and never had any fever. Of course she is on lisinopril but she tells me she has been on that medication for years with no cough. She bruises easily. She tells me her blood sugars are well controlled. A detailed review of systems today was otherwise contributory  PAST MEDICAL HISTORY: Past Medical History:  Diagnosis Date  . Cancer ([place]) 02/2016   right breast  . DIABETES MELLITUS, TYPE II 01/04/2007   only takes actoplus daily  . Dizziness and giddiness 02/29/2008  . DVT, HX OF    at age 53 in right buttocks  . Family history of breast cancer   . GERD 01/04/2007   pt reports resolved   . GLAUCOMA  07/30/2008   both eyes  . History of blood transfusion    no abnormal  reaction  . History of uterine cancer 2000   hysterectomy done  . HYPERLIPIDEMIA 01/04/2007   taking Pravastatin daily  . HYPERTENSION 01/04/2007   takes Lisinopril daily  . Joint pain   . Joint  swelling   . LEG PAIN, LEFT 07/06/2007  . NUMBNESS 07/30/2008   in fingers;pt states from [facility]  . OSTEOARTHRITIS, HIP 09/25/2009  . OTITIS MEDIA, ACUTE, BILATERAL 02/29/2008  . Overweight(278.02) 01/04/2007  . PONV (postoperative nausea and vomiting)   . SLEEP APNEA, OBSTRUCTIVE    doesn't use a cpap;study done about 14yr ago  . TRANSIENT ISCHEMIC ATTACK, HX OF 01/04/2007  . Vision loss    left eye    PAST SURGICAL HISTORY: Past Surgical History:  Procedure Laterality Date  . ABDOMINAL HYSTERECTOMY  2000  . CHOLECYSTECTOMY    . ENDOBRONCHIAL ULTRASOUND Bilateral 03/28/2016   Procedure: ENDOBRONCHIAL ULTRASOUND;  Surgeon: RCollene Gobble MD;  Location: [facility];  Service: Cardiopulmonary;  Laterality: Bilateral;  . EYE SURGERY  13   shunt left and lazer eye surgery on right cataract and retenia tear with repair  . growth removal  2004   from thumb  . KNEE ARTHROSCOPY Right   . mulitple eye surgeries     both eyes, cataracts with ioc done both eyes  . OOPHORECTOMY    . TOTAL HIP ARTHROPLASTY  06/24/2011   Procedure: TOTAL HIP ARTHROPLASTY;  Surgeon: FKerin Salen  [facility]  Service: Orthopedics;  Laterality: Right;  . TOTAL HIP ARTHROPLASTY Left 11/12/2012   Dr RMayer Camel . TOTAL HIP ARTHROPLASTY Left 11/12/2012   Procedure: TOTAL HIP ARTHROPLASTY;  Surgeon: FKerin Salen MD;  [facility]  Service: Orthopedics;  Laterality: Left;  DEPUY PINNACLE    FAMILY HISTORY Family History  Problem Relation Age of Onset  . Dementia Mother   . Cancer Mother     Breast and lung cancer  . Stroke Sister   . Breast cancer Sister 564 . Heart attack Maternal Aunt   . Lung cancer Maternal Grandmother     non smoker  . Glaucoma Maternal Grandfather   . Anesthesia problems Neg Hx   The patient's father died at age 410106 the patient's mother died at age 70 She had breast and lung cancers diagnosed shortly before her death. The patient had no brothers, 2 sisters. One sister was  diagnosed with breast cancer at the age of 557  GYNECOLOGIC HISTORY:  No LMP recorded. Patient has had a hysterectomy. Menarche age 89 first live birth age 70 the patient is GX P1. She had a hysterectomy for endometrial cancer in the year 2000. She did not take hormone replacement. She did use oral contraceptives for your than 20 years remotely, with no complications.  SOCIAL HISTORY:  BKewanais retired. She is home with her husband [name] Their son [place]anda [place].    ADVANCED DIRECTIVES: In place  HEALTH MAINTENANCE: Social History  Substance Use Topics  . Smoking status: Never Smoker  . Smokeless tobacco: Never Used  . Alcohol use No     Colonoscopy: Never  PAP: Status post hysterectomy  Bone density: Remote   Allergies  Allergen Reactions  . Atorvastatin Other (See Comments)    Leg cramp  . Codeine Hives    Hycodan syrup  . Fluorescein Nausea And Vomiting    ? IV dye for  retina specialist  . Lipitor [Atorvastatin Calcium]     Leg cramp  . Oxycodone Nausea And Vomiting    Patient vomited for 3 days after taking  . Sitagliptin Phosphate Nausea And Vomiting  . Sulfa Drugs Cross Reactors Nausea And Vomiting    Current Outpatient Prescriptions  Medication Sig Dispense Refill  . albuterol (PROVENTIL HFA;VENTOLIN HFA) 108 (90 Base) MCG/ACT inhaler Inhale 1-2 puffs into the lungs every 6 (six) hours as needed for wheezing or shortness of breath. 1 Inhaler 2  . aspirin 81 MG tablet Take 81 mg by mouth daily. Reported on 06/12/2015    . bimatoprost (LUMIGAN) 0.01 % SOLN 1 drop nightly.    . brimonidine-timolol (COMBIGAN) 0.2-0.5 % ophthalmic solution Place 1 drop into both eyes tid    . HYDROcodone-homatropine (HYCODAN) 5-1.5 MG/5ML syrup Take 5 mLs by mouth every 6 (six) hours as needed for cough. 120 mL 0  . letrozole (FEMARA) 2.5 MG tablet Take 1 tablet (2.5 mg total) by mouth at bedtime.    Marland Kitchen levofloxacin (LEVAQUIN) 500 MG tablet  Take 1 tablet (500 mg total) by mouth daily. 10 tablet 0  . lisinopril-hydrochlorothiazide (PRINZIDE,ZESTORETIC) 20-12.5 MG tablet TAKE ONE TABLET BY MOUTH EVERY DAY    . lovastatin (MEVACOR) 20 MG tablet Take 1 tablet (20 mg total) by mouth every evening. 90 tablet 3  . palbociclib (IBRANCE) 125 MG capsule Take 1 capsule (125 mg total) by mouth daily with breakfast. Take whole with food. 21 capsule 6  . pioglitazone-metformin (ACTOPLUS MET) 15-500 MG tablet Take 15-500 tablets by mouth daily. 90 tablet 3  . prednisoLONE acetate (PRED FORTE) 1 % ophthalmic suspension 1 drop every morning.     . prochlorperazine (COMPAZINE) 10 MG tablet Take 1 tablet (10 mg total) by mouth every 6 (six) hours as needed for nausea or vomiting. 30 tablet 0   No current facility-administered medications for this visit.     OBJECTIVE: Middle-aged white woman In no acute distress Vitals:   05/16/16 1401  BP: (!) 168/68  Pulse: 63  Resp: 17  Temp: 98.1 F (36.7 C)     Body mass index is 41.76 kg/m.    ECOG FS:1 - Symptomatic but completely ambulatory  Sclerae unicteric, pupils round and equal Oropharynx clear and moist-- no thrush or other lesions No cervical or supraclavicular adenopathy Lungs no rales or rhonchi Heart regular rate and rhythm Abd soft, nontender, positive bowel sounds MSK no focal spinal tenderness, no upper extremity lymphedema Neuro: nonfocal, well oriented, appropriate affect Breasts: Deferred  LAB RESULTS:  CMP     Component Value Date/Time   NA 141 05/10/2016 1046   K 4.3 05/10/2016 1046   CL 106 04/29/2015 1730   CO2 29 05/10/2016 1046   GLUCOSE 197 (H) 05/10/2016 1046   BUN 14.1 05/10/2016 1046   CREATININE 0.9 05/10/2016 1046   CALCIUM 9.5 05/10/2016 1046   PROT 7.0 05/10/2016 1046   ALBUMIN 3.6 05/10/2016 1046   AST 11 05/10/2016 1046   ALT 17 05/10/2016 1046   ALKPHOS 57 05/10/2016 1046   BILITOT 0.44 05/10/2016 1046   GFRNONAA >90 11/13/2012 0430   GFRAA >90  11/13/2012 0430    INo results found for: SPEP, UPEP  Lab Results  Component Value Date   WBC 3.5 (L) 05/16/2016   NEUTROABS 1.7 05/16/2016   HGB 11.2 (L) 05/16/2016   HCT 33.9 (L) 05/16/2016   MCV 85.6 05/16/2016   PLT 315 05/16/2016      Chemistry  Component Value Date/Time   NA 141 05/10/2016 1046   K 4.3 05/10/2016 1046   CL 106 04/29/2015 1730   CO2 29 05/10/2016 1046   BUN 14.1 05/10/2016 1046   CREATININE 0.9 05/10/2016 1046      Component Value Date/Time   CALCIUM 9.5 05/10/2016 1046   ALKPHOS 57 05/10/2016 1046   AST 11 05/10/2016 1046   ALT 17 05/10/2016 1046   BILITOT 0.44 05/10/2016 1046       No results found for: LABCA2  No components found for: LABCA125  No results for input(s): INR in the last 168 hours.  Urinalysis    Component Value Date/Time   COLORURINE YELLOW 10/30/2014 1513   APPEARANCEUR CLEAR 10/30/2014 1513   LABSPEC 1.010 10/30/2014 1513   PHURINE 7.0 10/30/2014 1513   GLUCOSEU NEGATIVE 10/30/2014 1513   HGBUR NEGATIVE 10/30/2014 1513   BILIRUBINUR NEGATIVE 10/30/2014 1513   KETONESUR NEGATIVE 10/30/2014 1513   PROTEINUR NEGATIVE 11/08/2012 1319   UROBILINOGEN 0.2 10/30/2014 1513   NITRITE NEGATIVE 10/30/2014 1513   LEUKOCYTESUR NEGATIVE 10/30/2014 1513     STUDIES: Dg Chest 2 View  Result Date: 05/10/2016 CLINICAL DATA:  Right side lung cancer.  Cough. EXAM: CHEST  2 VIEW COMPARISON:  CT 03/10/2016.  Chest x-ray 04/29/2015 FINDINGS: Previously seen nodular densities in both lower lungs by CT not appreciable by plain film. No confluent airspace opacity or effusion. Heart is normal size. IMPRESSION: No acute findings. Previously seen nodular areas in both lower lungs by CT not appreciable by plain film. Electronically Signed   By: Charlett Nose M.D.   On: 05/10/2016 11:31     ELIGIBLE FOR AVAILABLE RESEARCH PROTOCOL: no  ASSESSMENT: 70 y.o. [place] woman with a remote history of early stage endometrial cancer,  now status post right breast upper outer quadrant biopsy 01/22/2016 for a clinically multifocal T2 N0, stage 2A invasive ductal carcinoma, grade 1, estrogen and progesterone receptor positive, HER-2 negative, with an MIB-1 between 10 and 15%.  (1) right axillary lymph node biopsy 03/02/2016 positive  (2) genetics testing 01/13/2016 through the Custom gene panel offered by [facility] found no deleterious mutations in  ATM, BARD1, BRCA1, BRCA2, BRIP1, CDH1, CHEK2, EPCAM, FANCC, MLH1, MSH2, MSH6, MUTYH, NBN, PALB2, PMS2, POLD1, PTEN, RAD51C, RAD51D, TP53, and XRCC2  METASTATIC DISEASE: OCT 2017 (3) CT scans of the chest abdomen and pelvis obtained 03/10/2016 are consistent with bilateral lung metastases and mediastinal and hilar nodal involvement, but no liver or bone spread  (a) bronchoscopic lymph node biopsy 2 (station 7, 13R) 03/28/2016 confirms metastatic adenocarcinoma, estrogen receptor positive, HER-2 not amplified  (b) baseline CA-27-29 on 04/18/2016 was 137.5.  (4) letrozole started 03/15/2016, palbociclib added 03/29/2016  PLAN: Leighton is tolerating the letrozole and palbociclib remarkably well. The episode of bronchitis that she had last week is likely unrelated. In any case it is resolving.  She tells me her insurance has approved the palbociclib through December only. We will need to extend that of course. Our oral chemotherapy pharmacist has been alerted.  She will see me again the second week in January. Shortly before that visit she will have a repeat CT scan of the chest. That'll give [place] the first indication regarding possible treatment response.   I am going to start cutting back on her lab work. When she has labs 06/14/2016 I will add a repeat CA-27-29. We should have all that data available by the time she returns to see me 06/24/2015  She requested to  have her appointment at [facility] pulmonary cancelled. I believe that was already done.     Chauncey Cruel, MD   05/16/2016  6:14 PM Medical Oncology and Hematology [facility] [address] [place], [place] [address] Tel. [phone]    Fax. [phone]

## 2016-05-17 ENCOUNTER — Other Ambulatory Visit: Payer: Medicare Other

## 2016-05-19 ENCOUNTER — Other Ambulatory Visit: Payer: Self-pay

## 2016-05-20 ENCOUNTER — Encounter: Payer: Self-pay | Admitting: Internal Medicine

## 2016-05-20 NOTE — Progress Notes (Signed)
Abstracted. Scanned document is already in media file.

## 2016-05-24 ENCOUNTER — Other Ambulatory Visit (HOSPITAL_BASED_OUTPATIENT_CLINIC_OR_DEPARTMENT_OTHER): Payer: Medicare Other

## 2016-05-24 DIAGNOSIS — Z170 Estrogen receptor positive status [ER+]: Secondary | ICD-10-CM

## 2016-05-24 DIAGNOSIS — K760 Fatty (change of) liver, not elsewhere classified: Secondary | ICD-10-CM

## 2016-05-24 DIAGNOSIS — C50411 Malignant neoplasm of upper-outer quadrant of right female breast: Secondary | ICD-10-CM | POA: Diagnosis present

## 2016-05-24 DIAGNOSIS — C7801 Secondary malignant neoplasm of right lung: Secondary | ICD-10-CM

## 2016-05-24 DIAGNOSIS — Z8542 Personal history of malignant neoplasm of other parts of uterus: Secondary | ICD-10-CM

## 2016-05-24 LAB — COMPREHENSIVE METABOLIC PANEL
ALT: 13 U/L (ref 0–55)
AST: 12 U/L (ref 5–34)
Albumin: 3.7 g/dL (ref 3.5–5.0)
Alkaline Phosphatase: 54 U/L (ref 40–150)
Anion Gap: 9 mEq/L (ref 3–11)
BUN: 14.7 mg/dL (ref 7.0–26.0)
CO2: 26 mEq/L (ref 22–29)
Calcium: 9.1 mg/dL (ref 8.4–10.4)
Chloride: 104 mEq/L (ref 98–109)
Creatinine: 1 mg/dL (ref 0.6–1.1)
EGFR: 55 mL/min/{1.73_m2} — ABNORMAL LOW (ref >90)
Glucose: 228 mg/dl — ABNORMAL HIGH (ref 70–140)
Potassium: 4 mEq/L (ref 3.5–5.1)
Sodium: 140 mEq/L (ref 136–145)
Total Bilirubin: 0.61 mg/dL (ref 0.20–1.20)
Total Protein: 7 g/dL (ref 6.4–8.3)

## 2016-05-24 LAB — CBC WITH DIFFERENTIAL/PLATELET
BASO%: 0.8 % (ref 0.0–2.0)
Basophils Absolute: 0 10*3/uL (ref 0.0–0.1)
EOS%: 0.8 % (ref 0.0–7.0)
Eosinophils Absolute: 0 10*3/uL (ref 0.0–0.5)
HCT: 35.1 % (ref 34.8–46.6)
HGB: 11.9 g/dL (ref 11.6–15.9)
LYMPH%: 49.2 % (ref 14.0–49.7)
MCH: 29.2 pg (ref 25.1–34.0)
MCHC: 33.9 g/dL (ref 31.5–36.0)
MCV: 86 fL (ref 79.5–101.0)
MONO#: 0.1 10*3/uL (ref 0.1–0.9)
MONO%: 4.5 % (ref 0.0–14.0)
NEUT#: 1.1 10*3/uL — ABNORMAL LOW (ref 1.5–6.5)
NEUT%: 44.7 % (ref 38.4–76.8)
Platelets: 147 10*3/uL (ref 145–400)
RBC: 4.08 10*6/uL (ref 3.70–5.45)
RDW: 19.7 % — ABNORMAL HIGH (ref 11.2–14.5)
WBC: 2.4 10*3/uL — ABNORMAL LOW (ref 3.9–10.3)
lymph#: 1.2 10*3/uL (ref 0.9–3.3)

## 2016-05-26 ENCOUNTER — Telehealth: Payer: Self-pay | Admitting: Pharmacist

## 2016-05-26 NOTE — Telephone Encounter (Signed)
Received Diagnosis Verification Form fax request from [employer] - Co-pay relief program. Completed form. MD signed and dated form. Faxed without coversheet (as requested) to [facility], PharmD, BCPS, BCOP Oral Chemotherapy Clinic [phone]

## 2016-05-31 ENCOUNTER — Other Ambulatory Visit (HOSPITAL_BASED_OUTPATIENT_CLINIC_OR_DEPARTMENT_OTHER): Payer: Medicare Other

## 2016-05-31 ENCOUNTER — Telehealth: Payer: Self-pay | Admitting: Pharmacist

## 2016-05-31 DIAGNOSIS — C50411 Malignant neoplasm of upper-outer quadrant of right female breast: Secondary | ICD-10-CM | POA: Diagnosis present

## 2016-05-31 LAB — CBC WITH DIFFERENTIAL/PLATELET
BASO%: 0.7 % (ref 0.0–2.0)
Basophils Absolute: 0 10*3/uL (ref 0.0–0.1)
EOS%: 1.1 % (ref 0.0–7.0)
Eosinophils Absolute: 0 10*3/uL (ref 0.0–0.5)
HCT: 33 % — ABNORMAL LOW (ref 34.8–46.6)
HGB: 11 g/dL — ABNORMAL LOW (ref 11.6–15.9)
LYMPH%: 51.6 % — ABNORMAL HIGH (ref 14.0–49.7)
MCH: 29.4 pg (ref 25.1–34.0)
MCHC: 33.4 g/dL (ref 31.5–36.0)
MCV: 88 fL (ref 79.5–101.0)
MONO#: 0.4 10*3/uL (ref 0.1–0.9)
MONO%: 13.9 % (ref 0.0–14.0)
NEUT#: 0.9 10*3/uL — ABNORMAL LOW (ref 1.5–6.5)
NEUT%: 32.7 % — ABNORMAL LOW (ref 38.4–76.8)
Platelets: 165 10*3/uL (ref 145–400)
RBC: 3.75 10*6/uL (ref 3.70–5.45)
RDW: 23.8 % — ABNORMAL HIGH (ref 11.2–14.5)
WBC: 2.8 10*3/uL — ABNORMAL LOW (ref 3.9–10.3)
lymph#: 1.5 10*3/uL (ref 0.9–3.3)

## 2016-05-31 MED FILL — IBRANCE 125 MG CAPSULE: 125 | 28 days supply | Qty: 21 | Fill #2

## 2016-05-31 NOTE — Telephone Encounter (Signed)
Oral Chemotherapy Pharmacist Encounter  Received notification from [facility] that [employer] funds are still frozen despite resending physician form on 05/26/16.  I was able to successfully enroll patient for copay assistance funds through [employer] [facility]) Award amount: [phone] Award dates: 03/02/16-05/30/17 Billing information: ID: [id number] Grp: [id number] [id number] PCN: [id number]  Award letter will be scanned into patient's chart. Billing information has been faxed to [place] to put on patient's file. I requested that they fill Mrs. Malczewski's Ibrance Rx and call her when ready to pick up.  I called patient to alert her of good news and to be looking out for phone call from [facility].  Patient expressed understanding and appreciation.  Johny Drilling, PharmD, BCPS, BCOP 05/31/2016  3:22 PM Oral Oncology Clinic [phone]

## 2016-06-07 ENCOUNTER — Other Ambulatory Visit: Payer: Medicare Other

## 2016-06-13 DIAGNOSIS — Z923 Personal history of irradiation: Secondary | ICD-10-CM

## 2016-06-13 HISTORY — DX: Personal history of irradiation: Z92.3

## 2016-06-14 ENCOUNTER — Other Ambulatory Visit (HOSPITAL_BASED_OUTPATIENT_CLINIC_OR_DEPARTMENT_OTHER): Payer: Medicare Other

## 2016-06-14 DIAGNOSIS — C7801 Secondary malignant neoplasm of right lung: Secondary | ICD-10-CM

## 2016-06-14 DIAGNOSIS — Z8542 Personal history of malignant neoplasm of other parts of uterus: Secondary | ICD-10-CM

## 2016-06-14 DIAGNOSIS — C50411 Malignant neoplasm of upper-outer quadrant of right female breast: Secondary | ICD-10-CM

## 2016-06-14 DIAGNOSIS — Z170 Estrogen receptor positive status [ER+]: Secondary | ICD-10-CM

## 2016-06-14 DIAGNOSIS — K760 Fatty (change of) liver, not elsewhere classified: Secondary | ICD-10-CM

## 2016-06-14 LAB — COMPREHENSIVE METABOLIC PANEL
ALT: 15 U/L (ref 0–55)
AST: 12 U/L (ref 5–34)
Albumin: 3.9 g/dL (ref 3.5–5.0)
Alkaline Phosphatase: 46 U/L (ref 40–150)
Anion Gap: 9 mEq/L (ref 3–11)
BUN: 11.2 mg/dL (ref 7.0–26.0)
CO2: 27 mEq/L (ref 22–29)
Calcium: 9.2 mg/dL (ref 8.4–10.4)
Chloride: 105 mEq/L (ref 98–109)
Creatinine: 1 mg/dL (ref 0.6–1.1)
EGFR: 57 mL/min/{1.73_m2} — ABNORMAL LOW (ref >90)
Glucose: 213 mg/dl — ABNORMAL HIGH (ref 70–140)
Potassium: 4 mEq/L (ref 3.5–5.1)
Sodium: 141 mEq/L (ref 136–145)
Total Bilirubin: 0.67 mg/dL (ref 0.20–1.20)
Total Protein: 6.8 g/dL (ref 6.4–8.3)

## 2016-06-14 LAB — CBC WITH DIFFERENTIAL/PLATELET
BASO%: 0.7 % (ref 0.0–2.0)
Basophils Absolute: 0 10*3/uL (ref 0.0–0.1)
EOS%: 2 % (ref 0.0–7.0)
Eosinophils Absolute: 0 10*3/uL (ref 0.0–0.5)
HCT: 33.8 % — ABNORMAL LOW (ref 34.8–46.6)
HGB: 11.5 g/dL — ABNORMAL LOW (ref 11.6–15.9)
LYMPH%: 47.5 % (ref 14.0–49.7)
MCH: 30.9 pg (ref 25.1–34.0)
MCHC: 34 g/dL (ref 31.5–36.0)
MCV: 90.7 fL (ref 79.5–101.0)
MONO#: 0.2 10*3/uL (ref 0.1–0.9)
MONO%: 7.2 % (ref 0.0–14.0)
NEUT#: 1 10*3/uL — ABNORMAL LOW (ref 1.5–6.5)
NEUT%: 42.6 % (ref 38.4–76.8)
Platelets: 277 10*3/uL (ref 145–400)
RBC: 3.73 10*6/uL (ref 3.70–5.45)
RDW: 23.5 % — ABNORMAL HIGH (ref 11.2–14.5)
WBC: 2.4 10*3/uL — ABNORMAL LOW (ref 3.9–10.3)
lymph#: 1.1 10*3/uL (ref 0.9–3.3)

## 2016-06-15 ENCOUNTER — Other Ambulatory Visit: Payer: Self-pay | Admitting: *Deleted

## 2016-06-16 ENCOUNTER — Telehealth: Payer: Self-pay | Admitting: *Deleted

## 2016-06-16 NOTE — Telephone Encounter (Signed)
This RN spoke with pt today per her call stating she is still waiting on appointment for CT scan she will need prior to MD appointment 1/11.  Noted CT ordered on 05/16/2016 to be done 06/21/2016 not yet approved per 06/15/2016- this RN sent a request to [employer] 1/3/23018 per need to PA for scheduling.  Informed pt above is in process- [name] stated " I am sorry to be nagging you - but when I called a couple of weeks ago I was told it would not be pre authorized until at least 1 week prior to need and now I am still waiting "  " I am not frustrated at you - I am just concerned "  " I know I am not your only cancer patient but I am anxious and want to know if I am responding to the therapy as well as if I need to change my appointment with the doctor if scan cannot be done "  This RN validated pt's concerns- pt will call this RN tomorrow if she has not heard from central scheduling for an appointment.  [name] stated appreciation per plan above. No other needs at this time.

## 2016-06-20 ENCOUNTER — Other Ambulatory Visit: Payer: Self-pay | Admitting: *Deleted

## 2016-06-22 ENCOUNTER — Encounter (HOSPITAL_COMMUNITY): Payer: Self-pay

## 2016-06-22 ENCOUNTER — Ambulatory Visit (HOSPITAL_COMMUNITY)
Admission: RE | Admit: 2016-06-22 | Discharge: 2016-06-22 | Disposition: A | Discharge status: Home / Self Care | Payer: Medicare Other | Source: Ambulatory Visit | Attending: Oncology | Admitting: Oncology

## 2016-06-22 DIAGNOSIS — Z8542 Personal history of malignant neoplasm of other parts of uterus: Secondary | ICD-10-CM | POA: Diagnosis not present

## 2016-06-22 DIAGNOSIS — K838 Other specified diseases of biliary tract: Secondary | ICD-10-CM | POA: Diagnosis not present

## 2016-06-22 DIAGNOSIS — Z9049 Acquired absence of other specified parts of digestive tract: Secondary | ICD-10-CM | POA: Insufficient documentation

## 2016-06-22 DIAGNOSIS — N289 Disorder of kidney and ureter, unspecified: Secondary | ICD-10-CM | POA: Diagnosis not present

## 2016-06-22 DIAGNOSIS — R918 Other nonspecific abnormal finding of lung field: Secondary | ICD-10-CM | POA: Diagnosis not present

## 2016-06-22 DIAGNOSIS — I2510 Atherosclerotic heart disease of native coronary artery without angina pectoris: Secondary | ICD-10-CM | POA: Insufficient documentation

## 2016-06-22 DIAGNOSIS — C7801 Secondary malignant neoplasm of right lung: Secondary | ICD-10-CM

## 2016-06-22 DIAGNOSIS — C50411 Malignant neoplasm of upper-outer quadrant of right female breast: Secondary | ICD-10-CM | POA: Insufficient documentation

## 2016-06-22 DIAGNOSIS — R590 Localized enlarged lymph nodes: Secondary | ICD-10-CM | POA: Diagnosis not present

## 2016-06-22 DIAGNOSIS — Z170 Estrogen receptor positive status [ER+]: Secondary | ICD-10-CM | POA: Diagnosis not present

## 2016-06-22 MED ORDER — IOPAMIDOL (ISOVUE-300) INJECTION 61%
INTRAVENOUS | Status: DC
Start: 2016-06-22 — End: 2016-06-23
  Filled 2016-06-22: qty 75

## 2016-06-22 MED ORDER — IOPAMIDOL (ISOVUE-300) INJECTION 61%
75.0000 mL | Freq: Once | INTRAVENOUS | Status: AC | PRN
  Administered 2016-06-22: 75 mL via INTRAVENOUS

## 2016-06-22 MED FILL — IBRANCE 125 MG CAPSULE: 125 | 28 days supply | Qty: 21 | Fill #3

## 2016-06-23 ENCOUNTER — Ambulatory Visit (HOSPITAL_BASED_OUTPATIENT_CLINIC_OR_DEPARTMENT_OTHER): Payer: Medicare Other | Admitting: Oncology

## 2016-06-23 ENCOUNTER — Other Ambulatory Visit (HOSPITAL_BASED_OUTPATIENT_CLINIC_OR_DEPARTMENT_OTHER): Payer: Medicare Other

## 2016-06-23 VITALS — BP 186/73 | HR 77 | Temp 97.8°F | Resp 18 | Ht 64.0 in | Wt 243.4 lb

## 2016-06-23 DIAGNOSIS — C50411 Malignant neoplasm of upper-outer quadrant of right female breast: Secondary | ICD-10-CM

## 2016-06-23 DIAGNOSIS — Z8542 Personal history of malignant neoplasm of other parts of uterus: Secondary | ICD-10-CM

## 2016-06-23 DIAGNOSIS — C7801 Secondary malignant neoplasm of right lung: Secondary | ICD-10-CM | POA: Diagnosis not present

## 2016-06-23 DIAGNOSIS — Z170 Estrogen receptor positive status [ER+]: Secondary | ICD-10-CM | POA: Diagnosis not present

## 2016-06-23 DIAGNOSIS — C773 Secondary and unspecified malignant neoplasm of axilla and upper limb lymph nodes: Secondary | ICD-10-CM

## 2016-06-23 DIAGNOSIS — C7802 Secondary malignant neoplasm of left lung: Secondary | ICD-10-CM | POA: Diagnosis not present

## 2016-06-23 LAB — CBC WITH DIFFERENTIAL/PLATELET
BASO%: 0.5 % (ref 0.0–2.0)
Basophils Absolute: 0 10*3/uL (ref 0.0–0.1)
EOS%: 1.6 % (ref 0.0–7.0)
Eosinophils Absolute: 0 10*3/uL (ref 0.0–0.5)
HCT: 33.8 % — ABNORMAL LOW (ref 34.8–46.6)
HGB: 11.6 g/dL (ref 11.6–15.9)
LYMPH%: 55.9 % — ABNORMAL HIGH (ref 14.0–49.7)
MCH: 31.2 pg (ref 25.1–34.0)
MCHC: 34.3 g/dL (ref 31.5–36.0)
MCV: 90.9 fL (ref 79.5–101.0)
MONO#: 0.1 10*3/uL (ref 0.1–0.9)
MONO%: 5.4 % (ref 0.0–14.0)
NEUT#: 0.7 10*3/uL — ABNORMAL LOW (ref 1.5–6.5)
NEUT%: 36.6 % — ABNORMAL LOW (ref 38.4–76.8)
Platelets: 149 10*3/uL (ref 145–400)
RBC: 3.72 10*6/uL (ref 3.70–5.45)
RDW: 19.8 % — ABNORMAL HIGH (ref 11.2–14.5)
WBC: 1.9 10*3/uL — ABNORMAL LOW (ref 3.9–10.3)
lymph#: 1 10*3/uL (ref 0.9–3.3)

## 2016-06-23 MED ORDER — PALBOCICLIB 100 MG PO CAPS: 100.0000 mg | ORAL_CAPSULE | Freq: Every day | ORAL | 6 refills | Status: DC

## 2016-06-23 NOTE — Progress Notes (Signed)
[name]  [phone] [phone]     ID: [name] DOB: 03-Dec-1945  MR#: [id number]  ULA#:[id number]  Patient Care Team: Biagio Borg, MD as PCP - General Alphonsa Overall, MD as Consulting Physician (General Surgery) Chauncey Cruel, MD as Consulting Physician (Oncology) Kyung Rudd, MD as Consulting Physician (Radiation Oncology) Bobbye Charleston, MD as Consulting Physician (Obstetrics and Gynecology) Nada Libman, MD as Referring Physician (Specialist) Vevelyn Royals, MD as Consulting Physician (Ophthalmology) Collene Gobble, MD as Consulting Physician (Pulmonary Disease) OTHER MD:  CHIEF COMPLAINT: Estrogen receptor positive breast cancer  CURRENT TREATMENT: Letrozole, palbociclib   BREAST CANCER HISTORY: From the original intake note:  [name] had screening mammography showing some suspicious calcifications in the right breast leading to right diagnostic mammography with ultrasonography 01/22/2016 at [place]. The breast density was category C. In the upper right breast there was a 2.3 cm mass with additional masses measuring 0.9 and 0.7 cm. There was also a possible additional 0.8 mass in the lower inner quadrant. Ultrasound confirmed an irregular hypoechoic mass in the right breast upper outer quadrant measuring 2.0 cm. There were other masses measuring 0.7 and 0.8 cm by ultrasonography. The right axilla was sonographically benign.  Biopsy of a 12:00 and 4:00 mass in the right breast 01/22/2016 showed ([id number]) both specimens showing invasive ductal carcinoma, grade 1 or 2, both 95% estrogen receptor positive, both 95% progesterone receptor positive, both with strong staining intensity, with MIB-1 ranging from 10-15%, and both HER-2 negative, the signals ratio being 1.23-1.42, and the number per cell 1.85-2.59.  Her subsequent history is as detailed below  INTERVAL HISTORY: [name] returns today for follow-up of her estrogen receptor  positive metastatic breast cancer accompanied by her husband [name]. Shakerra is being treated with letrozole and palbociclib. She tolerates the letrozole well. Hot flashes and vaginal dryness are not a major issue. She never developed the arthralgias or myalgias that many patients can experience on this medication. She obtains it at a good price.  She has had some cytopenias on the palbociclib. 3 weeks ago her ANC was 0.9, 9 days ago 1.0, and today 0.7. She completed her most recent cycle 06/21/2016 and would be scheduled to start her next cycle 06/27/2016  We are just restage her with a repeat CT scan of the chest which is very favorable. All the measurable disease is smaller and there are no new lesions.  REVIEW OF SYSTEMS: [name] is feeling very normal and doing well area sometimes she feels moderately fatigued. She has very minimal nausea. On the third week of treatment she feels more sleepy, a little bit more nauseated, and actually can't vomit sometimes. She also gets loose bowel movements and altered taste. She feels well on the off week which is were she is now. It is disturbing to her that some family members are questioning whether she has metastatic cancer since she is doing so well overall. Unfortunately this includes her son and a sister. There literally accusing her of lying about having cancer. Aside from these issues a detailed review of systems below was stable.  PAST MEDICAL HISTORY: Past Medical History:  Diagnosis Date  . Cancer ([place]) 02/2016   right breast  . DIABETES MELLITUS, TYPE II 01/04/2007   only takes actoplus daily  . Dizziness and giddiness 02/29/2008  . DVT, HX OF    at age 35 in right buttocks  . Family history of breast cancer   . GERD 01/04/2007  pt reports resolved   . GLAUCOMA 07/30/2008   both eyes  . History of blood transfusion    no abnormal  reaction  . History of uterine cancer 2000   hysterectomy done  . HYPERLIPIDEMIA 01/04/2007   taking Pravastatin  daily  . HYPERTENSION 01/04/2007   takes Lisinopril daily  . Joint pain   . Joint swelling   . LEG PAIN, LEFT 07/06/2007  . NUMBNESS 07/30/2008   in fingers;pt states from Diam[facility]  . OSTEOARTHRITIS, HIP 09/25/2009  . OTITIS MEDIA, ACUTE, BILATERAL 02/29/2008  . Overweight(278.02) 01/04/2007  . PONV (postoperative nausea and vomiting)   . SLEEP APNEA, OBSTRUCTIVE    doesn't use a cpap;study done about 20yr ago  . TRANSIENT ISCHEMIC ATTACK, HX OF 01/04/2007  . Vision loss    left eye    PAST SURGICAL HISTORY: Past Surgical History:  Procedure Laterality Date  . ABDOMINAL HYSTERECTOMY  2000  . CHOLECYSTECTOMY    . ENDOBRONCHIAL ULTRASOUND Bilateral 03/28/2016   Procedure: ENDOBRONCHIAL ULTRASOUND;  Surgeon: RCollene Gobble MD;  Location: [facility];  Service: Cardiopulmonary;  Laterality: Bilateral;  . EYE SURGERY  13   shunt left and lazer eye surgery on right cataract and retenia tear with repair  . growth removal  2004   from thumb  . KNEE ARTHROSCOPY Right   . mulitple eye surgeries     both eyes, cataracts with ioc done both eyes  . OOPHORECTOMY    . TOTAL HIP ARTHROPLASTY  06/24/2011   Procedure: TOTAL HIP ARTHROPLASTY;  Surgeon: FKerin Salen  [place] [facility]  Service: Orthopedics;  Laterality: Right;  . TOTAL HIP ARTHROPLASTY Left 11/12/2012   Dr RMayer Camel . TOTAL HIP ARTHROPLASTY Left 11/12/2012   Procedure: TOTAL HIP ARTHROPLASTY;  Surgeon: FKerin Salen MD;  [facility] [facility]  Service: Orthopedics;  Laterality: Left;  DEPUY PINNACLE    FAMILY HISTORY Family History  Problem Relation Age of Onset  . Dementia Mother   . Cancer Mother     Breast and lung cancer  . Stroke Sister   . Breast cancer Sister 513 . Heart attack Maternal Aunt   . Lung cancer Maternal Grandmother     non smoker  . Glaucoma Maternal Grandfather   . Anesthesia problems Neg Hx   The patient's father died at age 71 the patient's mother died at age 71 She had breast and lung cancers diagnosed  shortly before her death. The patient had no brothers, 2 sisters. One sister was diagnosed with breast cancer at the age of 577  GYNECOLOGIC HISTORY:  No LMP recorded. Patient has had a hysterectomy. Menarche age 71 first live birth age 71 the patient is GX P1. She had a hysterectomy for endometrial cancer in the year 2000. She did not take hormone replacement. She did use oral contraceptives for your than 20 years remotely, with no complications.  SOCIAL HISTORY:  BMarileais retired. She is home with her husband TMarcello Obrien Their son CJuanda [place]rum[place]also lives in [place] currently between jobs.    ADVANCED DIRECTIVES: In place  HEALTH MAINTENANCE: Social History  Substance Use Topics  . Smoking status: Never Smoker  . Smokeless tobacco: Never Used  . Alcohol use No     Colonoscopy: Never  PAP: Status post hysterectomy  Bone density: Remote   Allergies  Allergen Reactions  . Atorvastatin Other (See Comments)    Leg cramp  . Codeine Hives    Hycodan syrup  . Fluorescein Nausea And Vomiting    ?  IV dye for retina specialist  . Lipitor [Atorvastatin Calcium]     Leg cramp  . Oxycodone Nausea And Vomiting    Patient vomited for 3 days after taking  . Sitagliptin Phosphate Nausea And Vomiting  . Sulfa Drugs Cross Reactors Nausea And Vomiting    Current Outpatient Prescriptions  Medication Sig Dispense Refill  . Vitamin D, Cholecalciferol, 1000 units TABS Take by mouth.    Marland Kitchen aspirin 81 MG tablet Take 81 mg by mouth daily. Reported on 06/12/2015    . bimatoprost (LUMIGAN) 0.01 % SOLN 1 drop nightly.    . brimonidine-timolol (COMBIGAN) 0.2-0.5 % ophthalmic solution Place 1 drop into both eyes tid    . letrozole (FEMARA) 2.5 MG tablet Take 1 tablet (2.5 mg total) by mouth at bedtime.    . lovastatin (MEVACOR) 20 MG tablet Take 1 tablet (20 mg total) by mouth every evening. 90 tablet 3  . palbociclib (IBRANCE) 100 MG capsule Take 1 capsule (100 mg total) by mouth daily with breakfast.  Take whole with food. 21 capsule 6  . pioglitazone-metformin (ACTOPLUS MET) 15-500 MG tablet Take 15-500 tablets by mouth daily. 90 tablet 3  . prednisoLONE acetate (PRED FORTE) 1 % ophthalmic suspension 1 drop every morning.     . prochlorperazine (COMPAZINE) 10 MG tablet Take 1 tablet (10 mg total) by mouth every 6 (six) hours as needed for nausea or vomiting. 30 tablet 0   No current facility-administered medications for this visit.     OBJECTIVE: Middle-aged white woman Who appears stated age  71:   06/23/16 1151  BP: (!) 186/73  Pulse: 77  Resp: 18  Temp: 97.8 F (36.6 C)     Body mass index is 41.78 kg/m.    ECOG FS:1 - Symptomatic but completely ambulatory  Sclerae unicteric, EOMs intact Oropharynx clear and moist No cervical or supraclavicular adenopathy Lungs no rales or rhonchi Heart regular rate and rhythm Abd soft, nontender, positive bowel sounds MSK no focal spinal tenderness, no upper extremity lymphedema Neuro: nonfocal, well oriented, appropriate affect Breasts: Deferred  LAB RESULTS:  CMP     Component Value Date/Time   NA 141 06/14/2016 1030   K 4.0 06/14/2016 1030   CL 106 04/29/2015 1730   CO2 27 06/14/2016 1030   GLUCOSE 213 (H) 06/14/2016 1030   BUN 11.2 06/14/2016 1030   CREATININE 1.0 06/14/2016 1030   CALCIUM 9.2 06/14/2016 1030   PROT 6.8 06/14/2016 1030   ALBUMIN 3.9 06/14/2016 1030   AST 12 06/14/2016 1030   ALT 15 06/14/2016 1030   ALKPHOS 46 06/14/2016 1030   BILITOT 0.67 06/14/2016 1030   GFRNONAA >90 11/13/2012 0430   GFRAA >90 11/13/2012 0430    INo results found for: SPEP, UPEP  Lab Results  Component Value Date   WBC 1.9 (L) 06/23/2016   NEUTROABS 0.7 (L) 06/23/2016   HGB 11.6 06/23/2016   HCT 33.8 (L) 06/23/2016   MCV 90.9 06/23/2016   PLT 149 06/23/2016      Chemistry      Component Value Date/Time   NA 141 06/14/2016 1030   K 4.0 06/14/2016 1030   CL 106 04/29/2015 1730   CO2 27 06/14/2016 1030   BUN  11.2 06/14/2016 1030   CREATININE 1.0 06/14/2016 1030      Component Value Date/Time   CALCIUM 9.2 06/14/2016 1030   ALKPHOS 46 06/14/2016 1030   AST 12 06/14/2016 1030   ALT 15 06/14/2016 1030   BILITOT 0.67  06/14/2016 1030       No results found for: LABCA2  No components found for: [id number]  No results for input(s): INR in the last 168 hours.  Urinalysis    Component Value Date/Time   COLORURINE YELLOW 10/30/2014 1513   APPEARANCEUR CLEAR 10/30/2014 1513   LABSPEC 1.010 10/30/2014 1513   PHURINE 7.0 10/30/2014 1513   GLUCOSEU NEGATIVE 10/30/2014 1513   HGBUR NEGATIVE 10/30/2014 1513   BILIRUBINUR NEGATIVE 10/30/2014 1513   KETONESUR NEGATIVE 10/30/2014 1513   PROTEINUR NEGATIVE 11/08/2012 1319   UROBILINOGEN 0.2 10/30/2014 1513   NITRITE NEGATIVE 10/30/2014 1513   LEUKOCYTESUR NEGATIVE 10/30/2014 1513     STUDIES: Ct Chest W Contrast  Result Date: 06/22/2016 CLINICAL DATA:  Left breast pain for 1 month. History of uterine cancer and right breast cancer with lung mass. EXAM: CT CHEST WITH CONTRAST TECHNIQUE: Multidetector CT imaging of the chest was performed during intravenous contrast administration. CONTRAST:  36m ISOVUE-300 IOPAMIDOL (ISOVUE-300) INJECTION 61% COMPARISON:  03/10/2016 FINDINGS: Cardiovascular: The heart size is normal. No pericardial effusion. Coronary artery calcification is noted. No thoracic aortic aneurysm. Mediastinum/Nodes: Stable asymmetric enlargement left thyroid lobe. 16 mm short axis subcarinal lymph node identified previously is 8 mm today. Left hilar lymph node measured previously at 10 mm short axis is now 6 mm. 10 mm short axis right lower hilar lymph node measured on the prior study has resolved. The esophagus has normal imaging features. No axillary lymphadenopathy. No substantial change in the previously measured lesion identified upper right breast at 13 x 25 mm today compared to 15 x 30 mm previously. Lungs/Pleura: As seen previously,  there are innumerable bilateral pulmonary nodules although many of these are smaller today. The dominant 11 x 7 mm subpleural nodule measured previously is 3 x 5 mm today (image 76 series 5). 4 mm nodule seen in the right upper lobe on image 44 series 5 today was 7 mm previously. 4 mm nodule along the left major fissure (image 74 series 5) was 6 mm previously. No focal airspace consolidation. No pulmonary edema or pleural effusion. Upper Abdomen: Gallbladder is surgically absent. The intra and extrahepatic biliary dilatation persists without substantial change. 10 mm left adrenal nodule is stable. 2.3 cm lesion in the upper pole the right kidney averages 40 Hounsfield units but is not substantially changed in size in the interval Musculoskeletal: Bone windows reveal no worrisome lytic or sclerotic osseous lesions. IMPRESSION: 1. Interval decrease in mediastinal, hilar and right axillary lymphadenopathy. The widespread bilateral pulmonary nodules have also decreased in the interval since the prior study. No new or progressive findings in the chest on today's study. 2. Coronary artery atherosclerosis. 3. 2.3 cm right upper pole renal lesion with attenuation too high to represent a simple cyst. This may be a cyst complicated by proteinaceous debris or hemorrhage, but continued attention on follow-up recommended. 4. No change in the intra and extrahepatic biliary duct dilatation in this patient status post cholecystectomy. Electronically Signed   By: EMisty StanleyM.D.   On: 06/22/2016 20:29     ELIGIBLE FOR AVAILABLE RESEARCH PROTOCOL: no  ASSESSMENT: 71y.o. [place] woman with a remote history of early stage endometrial cancer, now status post right breast upper outer quadrant biopsy 01/22/2016 for a clinically multifocal T2 N0, stage 2A invasive ductal carcinoma, grade 1, estrogen and progesterone receptor positive, HER-2 negative, with an MIB-1 between 10 and 15%.  (1) right axillary lymph node  biopsy 03/02/2016 positive  (2) genetics testing  01/13/2016 through the Custom gene panel offered by [facility] found no deleterious mutations in  ATM, BARD1, BRCA1, BRCA2, BRIP1, CDH1, CHEK2, EPCAM, FANCC, MLH1, MSH2, MSH6, MUTYH, NBN, PALB2, PMS2, POLD1, PTEN, RAD51C, RAD51D, TP53, and XRCC2  METASTATIC DISEASE: OCT 2017 (3) CT scans of the chest abdomen and pelvis obtained 03/10/2016 are consistent with bilateral lung metastases and mediastinal and hilar nodal involvement, but no liver or bone spread  (a) bronchoscopic lymph node biopsy 2 (station 7, 13R) 03/28/2016 confirms metastatic adenocarcinoma, estrogen receptor positive, HER-2 not amplified  (b) baseline CA-27-29 on 04/18/2016 was 137.5.  (4) letrozole started 03/15/2016, palbociclib added 03/29/2016 at 125 mg/day, 21/7  (a) dose decreased to 100 mg per day, 21/7, beginning with January cycle  PLAN: Niaya has been on her current treatment now for 3 months. We just restage her with a CT scan of the chest with very favorable results. Furthermore she is tolerating the treatments without any major complications.  We are going to adjust the palbociclib dose downward. Since urinary has the new cycles pills in hand, these being out on 25 mg, she will take 1 tablet every other day for 21 days, instead of taking it daily tablets. She will drop the dose 200 mg daily when she starts the February cycle.  Of course she is not quite ready to start since her [place] is still too low. I have put her in to receive lab work and weekly from now which time I hope the neutrophil count will have recovered sufficiently that she can start the next palbociclib cycle.  The common so being made by some of her family members are very distressing to her. I offered to discuss her situation with them if she will bring them in during one of her visits. I would not be able to discuss it with him otherwise.  At this point I'm very encouraged that we will be able to obtain  good control of her tumor. Very likely after her next CT scan, 3 months from now, she will be referred for definitive local treatment  She knows to call for any problems that may develop before her next visit here.    Chauncey Cruel, MD   06/23/2016 5:43 PM Medical Oncology and Hematology [facility] [address] [place], [place] [address] Tel. [phone]    Fax. [phone]

## 2016-06-24 LAB — CANCER ANTIGEN 27.29: CA 27.29: 77.4 U/mL — ABNORMAL HIGH (ref 0.0–38.6)

## 2016-06-27 ENCOUNTER — Encounter: Payer: Self-pay | Admitting: Oncology

## 2016-06-28 ENCOUNTER — Other Ambulatory Visit: Payer: Self-pay | Admitting: Oncology

## 2016-06-28 ENCOUNTER — Other Ambulatory Visit (HOSPITAL_BASED_OUTPATIENT_CLINIC_OR_DEPARTMENT_OTHER): Payer: Medicare Other

## 2016-06-28 DIAGNOSIS — Z170 Estrogen receptor positive status [ER+]: Secondary | ICD-10-CM | POA: Diagnosis not present

## 2016-06-28 DIAGNOSIS — C50411 Malignant neoplasm of upper-outer quadrant of right female breast: Secondary | ICD-10-CM | POA: Diagnosis not present

## 2016-06-28 DIAGNOSIS — Z8542 Personal history of malignant neoplasm of other parts of uterus: Secondary | ICD-10-CM

## 2016-06-28 DIAGNOSIS — K760 Fatty (change of) liver, not elsewhere classified: Secondary | ICD-10-CM

## 2016-06-28 DIAGNOSIS — C7801 Secondary malignant neoplasm of right lung: Secondary | ICD-10-CM

## 2016-06-28 LAB — COMPREHENSIVE METABOLIC PANEL
ALT: 15 U/L (ref 0–55)
AST: 12 U/L (ref 5–34)
Albumin: 3.8 g/dL (ref 3.5–5.0)
Alkaline Phosphatase: 46 U/L (ref 40–150)
Anion Gap: 9 mEq/L (ref 3–11)
BUN: 13 mg/dL (ref 7.0–26.0)
CO2: 25 mEq/L (ref 22–29)
Calcium: 9.2 mg/dL (ref 8.4–10.4)
Chloride: 108 mEq/L (ref 98–109)
Creatinine: 0.9 mg/dL (ref 0.6–1.1)
EGFR: 65 mL/min/{1.73_m2} — ABNORMAL LOW (ref >90)
Glucose: 192 mg/dl — ABNORMAL HIGH (ref 70–140)
Potassium: 4.4 mEq/L (ref 3.5–5.1)
Sodium: 142 mEq/L (ref 136–145)
Total Bilirubin: 0.67 mg/dL (ref 0.20–1.20)
Total Protein: 6.6 g/dL (ref 6.4–8.3)

## 2016-06-28 LAB — CBC WITH DIFFERENTIAL/PLATELET
BASO%: 1 % (ref 0.0–2.0)
Basophils Absolute: 0 10*3/uL (ref 0.0–0.1)
EOS%: 1 % (ref 0.0–7.0)
Eosinophils Absolute: 0 10*3/uL (ref 0.0–0.5)
HCT: 32.2 % — ABNORMAL LOW (ref 34.8–46.6)
HGB: 10.9 g/dL — ABNORMAL LOW (ref 11.6–15.9)
LYMPH%: 56.1 % — ABNORMAL HIGH (ref 14.0–49.7)
MCH: 31.1 pg (ref 25.1–34.0)
MCHC: 33.9 g/dL (ref 31.5–36.0)
MCV: 91.7 fL (ref 79.5–101.0)
MONO#: 0.3 10*3/uL (ref 0.1–0.9)
MONO%: 13.7 % (ref 0.0–14.0)
NEUT#: 0.6 10*3/uL — ABNORMAL LOW (ref 1.5–6.5)
NEUT%: 28.2 % — ABNORMAL LOW (ref 38.4–76.8)
Platelets: 118 10*3/uL — ABNORMAL LOW (ref 145–400)
RBC: 3.51 10*6/uL — ABNORMAL LOW (ref 3.70–5.45)
RDW: 19.9 % — ABNORMAL HIGH (ref 11.2–14.5)
WBC: 2.1 10*3/uL — ABNORMAL LOW (ref 3.9–10.3)
lymph#: 1.2 10*3/uL (ref 0.9–3.3)

## 2016-06-29 LAB — CANCER ANTIGEN 27.29: CA 27.29: 70.5 U/mL — ABNORMAL HIGH (ref 0.0–38.6)

## 2016-06-30 ENCOUNTER — Other Ambulatory Visit: Payer: Medicare Other

## 2016-07-05 ENCOUNTER — Telehealth: Payer: Self-pay | Admitting: *Deleted

## 2016-07-05 ENCOUNTER — Other Ambulatory Visit (HOSPITAL_BASED_OUTPATIENT_CLINIC_OR_DEPARTMENT_OTHER): Payer: Medicare Other

## 2016-07-05 DIAGNOSIS — C50411 Malignant neoplasm of upper-outer quadrant of right female breast: Secondary | ICD-10-CM

## 2016-07-05 LAB — CBC WITH DIFFERENTIAL/PLATELET
BASO%: 1.1 % (ref 0.0–2.0)
Basophils Absolute: 0 10*3/uL (ref 0.0–0.1)
EOS%: 1 % (ref 0.0–7.0)
Eosinophils Absolute: 0 10*3/uL (ref 0.0–0.5)
HCT: 32.9 % — ABNORMAL LOW (ref 34.8–46.6)
HGB: 11.5 g/dL — ABNORMAL LOW (ref 11.6–15.9)
LYMPH%: 40.2 % (ref 14.0–49.7)
MCH: 32.4 pg (ref 25.1–34.0)
MCHC: 35 g/dL (ref 31.5–36.0)
MCV: 92.7 fL (ref 79.5–101.0)
MONO#: 0.5 10*3/uL (ref 0.1–0.9)
MONO%: 13.4 % (ref 0.0–14.0)
NEUT#: 1.8 10*3/uL (ref 1.5–6.5)
NEUT%: 44.3 % (ref 38.4–76.8)
Platelets: 185 10*3/uL (ref 145–400)
RBC: 3.55 10*6/uL — ABNORMAL LOW (ref 3.70–5.45)
RDW: 20.8 % — ABNORMAL HIGH (ref 11.2–14.5)
WBC: 4 10*3/uL (ref 3.9–10.3)
lymph#: 1.6 10*3/uL (ref 0.9–3.3)

## 2016-07-08 ENCOUNTER — Ambulatory Visit: Payer: Medicare Other | Admitting: Pulmonary Disease

## 2016-07-12 ENCOUNTER — Telehealth: Payer: Self-pay | Admitting: *Deleted

## 2016-07-12 ENCOUNTER — Other Ambulatory Visit (HOSPITAL_BASED_OUTPATIENT_CLINIC_OR_DEPARTMENT_OTHER): Payer: Medicare Other

## 2016-07-12 DIAGNOSIS — C50411 Malignant neoplasm of upper-outer quadrant of right female breast: Secondary | ICD-10-CM

## 2016-07-12 LAB — CBC WITH DIFFERENTIAL/PLATELET
BASO%: 0.9 % (ref 0.0–2.0)
Basophils Absolute: 0 10*3/uL (ref 0.0–0.1)
EOS%: 3.1 % (ref 0.0–7.0)
Eosinophils Absolute: 0.1 10*3/uL (ref 0.0–0.5)
HCT: 33.8 % — ABNORMAL LOW (ref 34.8–46.6)
HGB: 11.6 g/dL (ref 11.6–15.9)
LYMPH%: 35.9 % (ref 14.0–49.7)
MCH: 31.8 pg (ref 25.1–34.0)
MCHC: 34.3 g/dL (ref 31.5–36.0)
MCV: 92.8 fL (ref 79.5–101.0)
MONO#: 0.3 10*3/uL (ref 0.1–0.9)
MONO%: 8.3 % (ref 0.0–14.0)
NEUT#: 2 10*3/uL (ref 1.5–6.5)
NEUT%: 51.8 % (ref 38.4–76.8)
Platelets: 290 10*3/uL (ref 145–400)
RBC: 3.65 10*6/uL — ABNORMAL LOW (ref 3.70–5.45)
RDW: 18.7 % — ABNORMAL HIGH (ref 11.2–14.5)
WBC: 3.8 10*3/uL — ABNORMAL LOW (ref 3.9–10.3)
lymph#: 1.4 10*3/uL (ref 0.9–3.3)

## 2016-07-12 NOTE — Telephone Encounter (Signed)
This RN spoke with pt per her concerns since starting QOD Ibrance.  [name] states " I am having increased fatigue - more then usual , and then yesterday I had a temp of 100.5 " " today I took my pill before coming up to get my labs checked and I started to get nauseated "  " seems like I am having more side effects doing the pill every other day instead of daily and am wondering if Dr Jana Hakim would want me to take it every day or if this is expected and I just need to deal with it ?"  [name] states she has anti nausea medication and will take it when she gets home.  " I just don't know if I am supposed to be feeling like this "  [name] denies being exposed to any one who has been sick recently.  Above will be reviewed with MD for any changes or recommendations.

## 2016-07-12 NOTE — Telephone Encounter (Signed)
This RN returned call to pt and informed her per MD review of labs today and her concern she may resume the Ibrance daily for 1 week with lab recheck 2/6.  Above discussed with pt who states " I feel much better - pt states " it seems when I eat at [employer] - it makes me feel better and I can keep it down "  Appointment made for next week.  Pt understands to call if symptom continue or worsen.

## 2016-07-19 ENCOUNTER — Other Ambulatory Visit (HOSPITAL_BASED_OUTPATIENT_CLINIC_OR_DEPARTMENT_OTHER): Payer: Medicare Other

## 2016-07-19 DIAGNOSIS — C50411 Malignant neoplasm of upper-outer quadrant of right female breast: Secondary | ICD-10-CM | POA: Diagnosis not present

## 2016-07-19 DIAGNOSIS — Z170 Estrogen receptor positive status [ER+]: Secondary | ICD-10-CM | POA: Diagnosis not present

## 2016-07-19 DIAGNOSIS — C7801 Secondary malignant neoplasm of right lung: Secondary | ICD-10-CM

## 2016-07-19 DIAGNOSIS — Z8542 Personal history of malignant neoplasm of other parts of uterus: Secondary | ICD-10-CM

## 2016-07-19 LAB — CBC WITH DIFFERENTIAL/PLATELET
BASO%: 0.4 % (ref 0.0–2.0)
Basophils Absolute: 0 10*3/uL (ref 0.0–0.1)
EOS%: 2.3 % (ref 0.0–7.0)
Eosinophils Absolute: 0.1 10*3/uL (ref 0.0–0.5)
HCT: 35 % (ref 34.8–46.6)
HGB: 11.9 g/dL (ref 11.6–15.9)
LYMPH%: 39.3 % (ref 14.0–49.7)
MCH: 31.9 pg (ref 25.1–34.0)
MCHC: 34.1 g/dL (ref 31.5–36.0)
MCV: 93.6 fL (ref 79.5–101.0)
MONO#: 0.2 10*3/uL (ref 0.1–0.9)
MONO%: 4.8 % (ref 0.0–14.0)
NEUT#: 1.9 10*3/uL (ref 1.5–6.5)
NEUT%: 53.2 % (ref 38.4–76.8)
Platelets: 337 10*3/uL (ref 145–400)
RBC: 3.74 10*6/uL (ref 3.70–5.45)
RDW: 18.2 % — ABNORMAL HIGH (ref 11.2–14.5)
WBC: 3.5 10*3/uL — ABNORMAL LOW (ref 3.9–10.3)
lymph#: 1.4 10*3/uL (ref 0.9–3.3)

## 2016-07-20 LAB — CANCER ANTIGEN 27.29: CA 27.29: 68.3 U/mL — ABNORMAL HIGH (ref 0.0–38.6)

## 2016-07-22 ENCOUNTER — Encounter (HOSPITAL_COMMUNITY): Payer: Self-pay | Admitting: Emergency Medicine

## 2016-07-22 ENCOUNTER — Observation Stay (HOSPITAL_COMMUNITY)
Admission: EM | Admit: 2016-07-22 | Discharge: 2016-07-23 | Disposition: A | Discharge status: Home / Self Care | Payer: Medicare Other | Attending: Internal Medicine | Admitting: Internal Medicine

## 2016-07-22 ENCOUNTER — Emergency Department (HOSPITAL_COMMUNITY): Payer: Medicare Other

## 2016-07-22 DIAGNOSIS — Z803 Family history of malignant neoplasm of breast: Secondary | ICD-10-CM | POA: Insufficient documentation

## 2016-07-22 DIAGNOSIS — E119 Type 2 diabetes mellitus without complications: Secondary | ICD-10-CM | POA: Diagnosis not present

## 2016-07-22 DIAGNOSIS — Z888 Allergy status to other drugs, medicaments and biological substances status: Secondary | ICD-10-CM | POA: Diagnosis not present

## 2016-07-22 DIAGNOSIS — J45909 Unspecified asthma, uncomplicated: Secondary | ICD-10-CM | POA: Diagnosis present

## 2016-07-22 DIAGNOSIS — C7800 Secondary malignant neoplasm of unspecified lung: Secondary | ICD-10-CM | POA: Diagnosis present

## 2016-07-22 DIAGNOSIS — J9601 Acute respiratory failure with hypoxia: Secondary | ICD-10-CM | POA: Insufficient documentation

## 2016-07-22 DIAGNOSIS — Z8673 Personal history of transient ischemic attack (TIA), and cerebral infarction without residual deficits: Secondary | ICD-10-CM | POA: Insufficient documentation

## 2016-07-22 DIAGNOSIS — Z86718 Personal history of other venous thrombosis and embolism: Secondary | ICD-10-CM | POA: Diagnosis not present

## 2016-07-22 DIAGNOSIS — Z7982 Long term (current) use of aspirin: Secondary | ICD-10-CM | POA: Insufficient documentation

## 2016-07-22 DIAGNOSIS — J029 Acute pharyngitis, unspecified: Secondary | ICD-10-CM | POA: Diagnosis present

## 2016-07-22 DIAGNOSIS — E785 Hyperlipidemia, unspecified: Secondary | ICD-10-CM | POA: Insufficient documentation

## 2016-07-22 DIAGNOSIS — Z96643 Presence of artificial hip joint, bilateral: Secondary | ICD-10-CM | POA: Insufficient documentation

## 2016-07-22 DIAGNOSIS — R0902 Hypoxemia: Secondary | ICD-10-CM

## 2016-07-22 DIAGNOSIS — C50411 Malignant neoplasm of upper-outer quadrant of right female breast: Secondary | ICD-10-CM | POA: Diagnosis not present

## 2016-07-22 DIAGNOSIS — Z882 Allergy status to sulfonamides status: Secondary | ICD-10-CM | POA: Insufficient documentation

## 2016-07-22 DIAGNOSIS — C7801 Secondary malignant neoplasm of right lung: Secondary | ICD-10-CM | POA: Diagnosis not present

## 2016-07-22 DIAGNOSIS — Z6841 Body Mass Index (BMI) 40.0 and over, adult: Secondary | ICD-10-CM | POA: Insufficient documentation

## 2016-07-22 DIAGNOSIS — J101 Influenza due to other identified influenza virus with other respiratory manifestations: Principal | ICD-10-CM | POA: Diagnosis present

## 2016-07-22 DIAGNOSIS — R0602 Shortness of breath: Secondary | ICD-10-CM | POA: Diagnosis not present

## 2016-07-22 DIAGNOSIS — Z7984 Long term (current) use of oral hypoglycemic drugs: Secondary | ICD-10-CM | POA: Insufficient documentation

## 2016-07-22 DIAGNOSIS — I10 Essential (primary) hypertension: Secondary | ICD-10-CM | POA: Diagnosis not present

## 2016-07-22 DIAGNOSIS — N390 Urinary tract infection, site not specified: Secondary | ICD-10-CM | POA: Diagnosis not present

## 2016-07-22 DIAGNOSIS — Z79899 Other long term (current) drug therapy: Secondary | ICD-10-CM | POA: Insufficient documentation

## 2016-07-22 DIAGNOSIS — R8290 Unspecified abnormal findings in urine: Secondary | ICD-10-CM | POA: Insufficient documentation

## 2016-07-22 DIAGNOSIS — Z170 Estrogen receptor positive status [ER+]: Secondary | ICD-10-CM | POA: Insufficient documentation

## 2016-07-22 DIAGNOSIS — H409 Unspecified glaucoma: Secondary | ICD-10-CM | POA: Insufficient documentation

## 2016-07-22 DIAGNOSIS — Z8542 Personal history of malignant neoplasm of other parts of uterus: Secondary | ICD-10-CM | POA: Diagnosis not present

## 2016-07-22 DIAGNOSIS — J09X2 Influenza due to identified novel influenza A virus with other respiratory manifestations: Secondary | ICD-10-CM | POA: Diagnosis not present

## 2016-07-22 DIAGNOSIS — Z79811 Long term (current) use of aromatase inhibitors: Secondary | ICD-10-CM | POA: Insufficient documentation

## 2016-07-22 DIAGNOSIS — M791 Myalgia: Secondary | ICD-10-CM | POA: Diagnosis present

## 2016-07-22 DIAGNOSIS — R05 Cough: Secondary | ICD-10-CM | POA: Diagnosis not present

## 2016-07-22 LAB — URINALYSIS, ROUTINE W REFLEX MICROSCOPIC
Bilirubin Urine: NEGATIVE
Glucose, UA: NEGATIVE mg/dL
Ketones, ur: 5 mg/dL — AB
Nitrite: NEGATIVE
Protein, ur: NEGATIVE mg/dL
Specific Gravity, Urine: 1.013 (ref 1.005–1.030)
pH: 5 (ref 5.0–8.0)

## 2016-07-22 LAB — CBC WITH DIFFERENTIAL/PLATELET
Basophils Absolute: 0 10*3/uL (ref 0.0–0.1)
Basophils Relative: 0 %
Eosinophils Absolute: 0 10*3/uL (ref 0.0–0.7)
Eosinophils Relative: 1 %
HCT: 32.4 % — ABNORMAL LOW (ref 36.0–46.0)
Hemoglobin: 11.3 g/dL — ABNORMAL LOW (ref 12.0–15.0)
Lymphocytes Relative: 39 %
Lymphs Abs: 0.8 10*3/uL (ref 0.7–4.0)
MCH: 31.9 pg (ref 26.0–34.0)
MCHC: 34.9 g/dL (ref 30.0–36.0)
MCV: 91.5 fL (ref 78.0–100.0)
Monocytes Absolute: 0.1 10*3/uL (ref 0.1–1.0)
Monocytes Relative: 6 %
Neutro Abs: 1.1 10*3/uL — ABNORMAL LOW (ref 1.7–7.7)
Neutrophils Relative %: 54 %
Platelets: 211 10*3/uL (ref 150–400)
RBC: 3.54 MIL/uL — ABNORMAL LOW (ref 3.87–5.11)
RDW: 16 % — ABNORMAL HIGH (ref 11.5–15.5)
WBC: 2 10*3/uL — ABNORMAL LOW (ref 4.0–10.5)

## 2016-07-22 LAB — COMPREHENSIVE METABOLIC PANEL
ALT: 16 U/L (ref 14–54)
AST: 17 U/L (ref 15–41)
Albumin: 4.1 g/dL (ref 3.5–5.0)
Alkaline Phosphatase: 43 U/L (ref 38–126)
Anion gap: 8 (ref 5–15)
BUN: 9 mg/dL (ref 6–20)
CO2: 26 mmol/L (ref 22–32)
Calcium: 8.8 mg/dL — ABNORMAL LOW (ref 8.9–10.3)
Chloride: 103 mmol/L (ref 101–111)
Creatinine, Ser: 1 mg/dL (ref 0.44–1.00)
GFR calc Af Amer: >60 mL/min (ref >60)
GFR calc non Af Amer: 56 mL/min — ABNORMAL LOW (ref >60)
Glucose, Bld: 159 mg/dL — ABNORMAL HIGH (ref 65–99)
Potassium: 3.9 mmol/L (ref 3.5–5.1)
Sodium: 137 mmol/L (ref 135–145)
Total Bilirubin: 0.3 mg/dL (ref 0.3–1.2)
Total Protein: 7.4 g/dL (ref 6.5–8.1)

## 2016-07-22 LAB — I-STAT TROPONIN, ED: Troponin i, poc: 0 ng/mL (ref 0.00–0.08)

## 2016-07-22 LAB — RAPID STREP SCREEN (MED CTR MEBANE ONLY): Streptococcus, Group A Screen (Direct): NEGATIVE

## 2016-07-22 LAB — BRAIN NATRIURETIC PEPTIDE: B Natriuretic Peptide: 33.4 pg/mL (ref 0.0–100.0)

## 2016-07-22 LAB — INFLUENZA PANEL BY PCR (TYPE A & B)
Influenza A By PCR: POSITIVE — AB
Influenza B By PCR: NEGATIVE

## 2016-07-22 LAB — LIPASE, BLOOD: Lipase: 12 U/L (ref 11–51)

## 2016-07-22 LAB — GLUCOSE, CAPILLARY
Glucose-Capillary: 382 mg/dL — ABNORMAL HIGH (ref 65–99)
Glucose-Capillary: 235 mg/dL — ABNORMAL HIGH (ref 65–99)

## 2016-07-22 MED ORDER — ONDANSETRON HCL 4 MG/2ML IJ SOLN
4.0000 mg | Freq: Once | INTRAMUSCULAR | Status: DC
  Filled 2016-07-22: qty 2

## 2016-07-22 MED ORDER — PREDNISOLONE ACETATE 1 % OP SUSP
1.0000 [drp] | Freq: Every day | OPHTHALMIC | Status: DC
  Filled 2016-07-22: qty 1

## 2016-07-22 MED ORDER — GUAIFENESIN 100 MG/5ML PO SOLN
200.0000 mg | ORAL | Status: DC | PRN
  Administered 2016-07-22: 200 mg via ORAL
  Filled 2016-07-22 (×2): qty 10

## 2016-07-22 MED ORDER — ACETAMINOPHEN 325 MG PO TABS
650.0000 mg | ORAL_TABLET | Freq: Four times a day (QID) | ORAL | Status: DC | PRN
  Administered 2016-07-22: 650 mg via ORAL
  Filled 2016-07-22: qty 2

## 2016-07-22 MED ORDER — ENOXAPARIN SODIUM 40 MG/0.4ML ~~LOC~~ SOLN
40.0000 mg | SUBCUTANEOUS | Status: DC
  Administered 2016-07-22: 40 mg via SUBCUTANEOUS
  Filled 2016-07-22: qty 0.4

## 2016-07-22 MED ORDER — IBUPROFEN 200 MG PO TABS
600.0000 mg | ORAL_TABLET | Freq: Four times a day (QID) | ORAL | Status: DC | PRN
  Administered 2016-07-22: 600 mg via ORAL
  Filled 2016-07-22: qty 3

## 2016-07-22 MED ORDER — INSULIN ASPART 100 UNIT/ML ~~LOC~~ SOLN
0.0000 [IU] | Freq: Three times a day (TID) | SUBCUTANEOUS | Status: DC
  Administered 2016-07-22: 15 [IU] via SUBCUTANEOUS
  Administered 2016-07-23: 2 [IU] via SUBCUTANEOUS

## 2016-07-22 MED ORDER — ONDANSETRON HCL 4 MG PO TABS: 4.0000 mg | ORAL_TABLET | Freq: Four times a day (QID) | ORAL | Status: DC | PRN

## 2016-07-22 MED ORDER — PREDNISONE 20 MG PO TABS
40.0000 mg | ORAL_TABLET | Freq: Every day | ORAL | Status: DC
  Administered 2016-07-23: 40 mg via ORAL
  Filled 2016-07-22: qty 2

## 2016-07-22 MED ORDER — ACETAMINOPHEN 650 MG RE SUPP: 650.0000 mg | Freq: Four times a day (QID) | RECTAL | Status: DC | PRN

## 2016-07-22 MED ORDER — IPRATROPIUM-ALBUTEROL 0.5-2.5 (3) MG/3ML IN SOLN: 3.0000 mL | Freq: Four times a day (QID) | RESPIRATORY_TRACT | Status: DC | PRN

## 2016-07-22 MED ORDER — IPRATROPIUM-ALBUTEROL 0.5-2.5 (3) MG/3ML IN SOLN
3.0000 mL | RESPIRATORY_TRACT | Status: DC
  Administered 2016-07-22: 3 mL via RESPIRATORY_TRACT
  Filled 2016-07-22 (×2): qty 3

## 2016-07-22 MED ORDER — SODIUM CHLORIDE 0.9 % IV BOLUS (SEPSIS)
500.0000 mL | Freq: Once | INTRAVENOUS | Status: AC
  Administered 2016-07-22: 500 mL via INTRAVENOUS

## 2016-07-22 MED ORDER — METHYLPREDNISOLONE SODIUM SUCC 125 MG IJ SOLR
125.0000 mg | Freq: Once | INTRAMUSCULAR | Status: AC
  Administered 2016-07-22: 125 mg via INTRAVENOUS
  Filled 2016-07-22: qty 2

## 2016-07-22 MED ORDER — BRIMONIDINE TARTRATE 0.2 % OP SOLN
1.0000 [drp] | Freq: Every day | OPHTHALMIC | Status: DC
  Filled 2016-07-22: qty 5

## 2016-07-22 MED ORDER — ONDANSETRON HCL 4 MG/2ML IJ SOLN: 4.0000 mg | Freq: Four times a day (QID) | INTRAMUSCULAR | Status: DC | PRN

## 2016-07-22 MED ORDER — ALBUTEROL SULFATE (2.5 MG/3ML) 0.083% IN NEBU
2.5000 mg | INHALATION_SOLUTION | Freq: Three times a day (TID) | RESPIRATORY_TRACT | Status: DC
  Administered 2016-07-23: 2.5 mg via RESPIRATORY_TRACT
  Filled 2016-07-22: qty 3

## 2016-07-22 MED ORDER — TIMOLOL MALEATE 0.5 % OP SOLN
1.0000 [drp] | Freq: Every day | OPHTHALMIC | Status: DC
  Filled 2016-07-22: qty 5

## 2016-07-22 MED ORDER — ALBUTEROL (5 MG/ML) CONTINUOUS INHALATION SOLN
10.0000 mg/h | INHALATION_SOLUTION | RESPIRATORY_TRACT | Status: DC
  Administered 2016-07-22: 10 mg/h via RESPIRATORY_TRACT
  Filled 2016-07-22: qty 20

## 2016-07-22 MED ORDER — CHLORHEXIDINE GLUCONATE 0.12 % MT SOLN
15.0000 mL | Freq: Two times a day (BID) | OROMUCOSAL | Status: DC
  Administered 2016-07-22 – 2016-07-23 (×2): 15 mL via OROMUCOSAL
  Filled 2016-07-22 (×2): qty 15

## 2016-07-22 MED ORDER — LETROZOLE 2.5 MG PO TABS
2.5000 mg | ORAL_TABLET | Freq: Every day | ORAL | Status: DC
  Administered 2016-07-22: 2.5 mg via ORAL
  Filled 2016-07-22 (×2): qty 1

## 2016-07-22 MED ORDER — ASPIRIN EC 81 MG PO TBEC
81.0000 mg | DELAYED_RELEASE_TABLET | Freq: Every day | ORAL | Status: DC
  Administered 2016-07-22 – 2016-07-23 (×2): 81 mg via ORAL
  Filled 2016-07-22 (×2): qty 1

## 2016-07-22 MED ORDER — HYDRALAZINE HCL 20 MG/ML IJ SOLN
5.0000 mg | Freq: Four times a day (QID) | INTRAMUSCULAR | Status: DC | PRN
  Administered 2016-07-22: 5 mg via INTRAVENOUS
  Filled 2016-07-22: qty 1

## 2016-07-22 MED ORDER — BRIMONIDINE TARTRATE-TIMOLOL 0.2-0.5 % OP SOLN
1.0000 [drp] | Freq: Every day | OPHTHALMIC | Status: DC
  Filled 2016-07-22: qty 5

## 2016-07-22 MED ORDER — BISACODYL 5 MG PO TBEC: 5.0000 mg | DELAYED_RELEASE_TABLET | Freq: Every day | ORAL | Status: DC | PRN

## 2016-07-22 MED ORDER — OSELTAMIVIR PHOSPHATE 75 MG PO CAPS
75.0000 mg | ORAL_CAPSULE | Freq: Two times a day (BID) | ORAL | Status: DC
  Administered 2016-07-22 – 2016-07-23 (×3): 75 mg via ORAL
  Filled 2016-07-22 (×3): qty 1

## 2016-07-22 MED ORDER — ALBUTEROL SULFATE (2.5 MG/3ML) 0.083% IN NEBU
5.0000 mg | INHALATION_SOLUTION | Freq: Once | RESPIRATORY_TRACT | Status: AC
  Administered 2016-07-22: 5 mg via RESPIRATORY_TRACT
  Filled 2016-07-22: qty 6

## 2016-07-22 MED ORDER — GI COCKTAIL ~~LOC~~
30.0000 mL | Freq: Once | ORAL | Status: AC
  Administered 2016-07-22: 30 mL via ORAL
  Filled 2016-07-22: qty 30

## 2016-07-22 MED ORDER — CEFTRIAXONE SODIUM 1 G IJ SOLR
1.0000 g | Freq: Once | INTRAMUSCULAR | Status: AC
  Administered 2016-07-22: 1 g via INTRAVENOUS
  Filled 2016-07-22: qty 10

## 2016-07-22 NOTE — ED Notes (Signed)
Patient attempted to collect urine but missed collection hat.

## 2016-07-22 NOTE — ED Notes (Signed)
Patient transported to X-ray 

## 2016-07-22 NOTE — ED Notes (Signed)
Hospitatlist at bedside. 

## 2016-07-22 NOTE — ED Triage Notes (Signed)
patient reports body aches, nausea, sore throat, headache and low grade fever since Tuesday. Patient states that her last Bm was this morning and was normal. Reports headache was relieved by Tylenol this morning. Patient has cough with clear mucous.  Patient unable to tolerate foods since Tuesday.

## 2016-07-22 NOTE — ED Provider Notes (Signed)
[facility] DEPT Provider Note   CSN: [id number] Arrival date & time: 07/22/16  0725     History   Chief Complaint Chief Complaint  Patient presents with  . Generalized Body Aches  . Nausea  . Sore Throat    HPI [name] is a 71 y.o. female.  HPI Patient presents with 4 days of rhinorrhea, sore throat, cough, diffuse myalgias, subjective fevers and chills. Patient states cough has been productive of yellow mucous. She also has had decreased by mouth intake and nausea. She's been taking ibuprofen every 4-5 hours. Denies any new lower extremity swelling or pain. No diarrhea or blood in stool. Husband with recent similar illness. Patient was recently diagnosed with breast cancer and is on oral chemotherapy. Past Medical History:  Diagnosis Date  . Cancer ([place]) 02/2016   right breast  . DIABETES MELLITUS, TYPE II 01/04/2007   only takes actoplus daily  . Dizziness and giddiness 02/29/2008  . DVT, HX OF    at age 6 in right buttocks  . Family history of breast cancer   . GERD 01/04/2007   pt reports resolved   . GLAUCOMA 07/30/2008   both eyes  . History of blood transfusion    no abnormal  reaction  . History of uterine cancer 2000   hysterectomy done  . HYPERLIPIDEMIA 01/04/2007   taking Pravastatin daily  . HYPERTENSION 01/04/2007   takes Lisinopril daily  . Joint pain   . Joint swelling   . LEG PAIN, LEFT 07/06/2007  . NUMBNESS 07/30/2008   in fingers;pt states from Diamox  . OSTEOARTHRITIS, HIP 09/25/2009  . OTITIS MEDIA, ACUTE, BILATERAL 02/29/2008  . Overweight(278.02) 01/04/2007  . PONV (postoperative nausea and vomiting)   . SLEEP APNEA, OBSTRUCTIVE    doesn't use a cpap;study done about 89yr ago  . TRANSIENT ISCHEMIC ATTACK, HX OF 01/04/2007  . Vision loss    left eye    Patient Active Problem List   Diagnosis Date Noted  . Influenza A 07/22/2016  . Rash 05/13/2016  . Bronchitis 05/13/2016  . Hepatic steatosis 04/18/2016  . Mediastinal  lymphadenopathy   . Pulmonary nodules 03/22/2016  . Lung metastases ([place] 03/15/2016  . Genetic testing 02/16/2016  . Morbid obesity ([place] 02/03/2016  . Family history of breast cancer   . History of uterine cancer   . Malignant neoplasm of upper-outer quadrant of right breast in female, estrogen receptor positive ([facility] 01/26/2016  . Retinal vein occlusion, branch, left 05/02/2015  . Osteoarthritis of left hip 11/12/2012  . Degenerative arthritis of hip 05/28/2012  . Encounter for well adult exam with abnormal findings 10/22/2010  . OSTEOARTHRITIS, HIP 09/25/2009  . GLAUCOMA 07/30/2008  . NUMBNESS 07/30/2008  . Dizziness and giddiness 02/29/2008  . LEG PAIN, LEFT 07/06/2007  . DVT, HX OF 07/06/2007  . Diabetes ([place] 01/04/2007  . Hyperlipidemia 01/04/2007  . Depression with anxiety 01/04/2007  . Obstructive sleep apnea 01/04/2007  . Essential hypertension 01/04/2007  . ASTHMA 01/04/2007  . GERD 01/04/2007  . TRANSIENT ISCHEMIC ATTACK, HX OF 01/04/2007    Past Surgical History:  Procedure Laterality Date  . ABDOMINAL HYSTERECTOMY  2000  . CHOLECYSTECTOMY    . ENDOBRONCHIAL ULTRASOUND Bilateral 03/28/2016   Procedure: ENDOBRONCHIAL ULTRASOUND;  Surgeon: RCollene Gobble MD;  Location: [facility];  Service: Cardiopulmonary;  Laterality: Bilateral;  . EYE SURGERY  13   shunt left and lazer eye surgery on right cataract and retenia tear with repair  . growth removal  2004   from thumb  . KNEE ARTHROSCOPY Right   . mulitple eye surgeries     both eyes, cataracts with ioc done both eyes  . OOPHORECTOMY    . TOTAL HIP ARTHROPLASTY  06/24/2011   Procedure: TOTAL HIP ARTHROPLASTY;  Surgeon: Kerin Salen;  Location: [facility];  Service: Orthopedics;  Laterality: Right;  . TOTAL HIP ARTHROPLASTY Left 11/12/2012   Dr Mayer Camel  . TOTAL HIP ARTHROPLASTY Left 11/12/2012   Procedure: TOTAL HIP ARTHROPLASTY;  Surgeon: Kerin Salen, MD;  Location: [facility];  Service: Orthopedics;  Laterality: Left;   DEPUY PINNACLE    OB History    No data available       Home Medications    Prior to Admission medications   Medication Sig Start Date End Date Taking? Authorizing Provider  acetaminophen (TYLENOL) 500 MG tablet Take 1,000 mg by mouth every 4 (four) hours as needed for moderate pain or fever.   Yes Historical Provider, MD  aspirin 81 MG tablet Take 81 mg by mouth daily. Reported on 06/12/2015   Yes Historical Provider, MD  bimatoprost (LUMIGAN) 0.01 % SOLN 1 drop into each eye once nightly.   Yes Historical Provider, MD  brimonidine-timolol (COMBIGAN) 0.2-0.5 % ophthalmic solution Place 1 drop into both eyes three times daily   Yes Historical Provider, MD  guaifenesin (ROBITUSSIN) 100 MG/5ML syrup Take 200 mg by mouth every 4 (four) hours as needed for cough or congestion.   Yes Historical Provider, MD  letrozole (FEMARA) 2.5 MG tablet Take 1 tablet (2.5 mg total) by mouth at bedtime. 03/28/16  Yes Collene Gobble, MD  lovastatin (MEVACOR) 20 MG tablet Take 1 tablet (20 mg total) by mouth every evening. 04/21/16  Yes Biagio Borg, MD  palbociclib [facility]) 100 MG capsule Take 1 capsule (100 mg total) by mouth daily with breakfast. Take whole with food. 06/23/16  Yes Chauncey Cruel, MD  pioglitazone-metformin (ACTOPLUS MET) 15-500 MG tablet Take 15-500 tablets by mouth daily. Patient taking differently: Take 1 tablet by mouth daily.  04/21/16  Yes Biagio Borg, MD  prednisoLONE acetate (PRED FORTE) 1 % ophthalmic suspension Place 1 drop into both eyes every morning.    Yes Historical Provider, MD  prochlorperazine (COMPAZINE) 10 MG tablet Take 1 tablet (10 mg total) by mouth every 6 (six) hours as needed for nausea or vomiting. 04/18/16  Yes Chauncey Cruel, MD  Vitamin D, Cholecalciferol, 1000 units TABS Take 1,000 Units by mouth every evening.    Yes Historical Provider, MD    Family History Family History  Problem Relation Age of Onset  . Dementia Mother   . Cancer Mother      Breast and lung cancer  . Stroke Sister   . Breast cancer Sister 69  . Heart attack Maternal Aunt   . Lung cancer Maternal Grandmother     non smoker  . Glaucoma Maternal Grandfather   . Anesthesia problems Neg Hx     Social History Social History  Substance Use Topics  . Smoking status: Never Smoker  . Smokeless tobacco: Never Used  . Alcohol use No     Allergies   Codeine; Fluorescein; Lipitor [atorvastatin calcium]; Oxycodone; Sitagliptin phosphate; and Sulfa drugs cross reactors   Review of Systems Review of Systems  Constitutional: Positive for appetite change, chills, fatigue and fever.  HENT: Positive for rhinorrhea and sore throat. Negative for trouble swallowing.   Respiratory: Positive for cough, shortness of breath and  wheezing. Negative for chest tightness.   Cardiovascular: Negative for chest pain, palpitations and leg swelling.  Gastrointestinal: Positive for abdominal pain and nausea. Negative for blood in stool, constipation, diarrhea and vomiting.  Genitourinary: Negative for dysuria, flank pain, frequency and hematuria.  Musculoskeletal: Positive for back pain and myalgias. Negative for joint swelling, neck pain and neck stiffness.  Skin: Negative for rash and wound.  Neurological: Positive for weakness (generalized) and light-headedness. Negative for dizziness, syncope, numbness and headaches.  All other systems reviewed and are negative.    Physical Exam Updated Vital Signs BP (!) 150/82 (BP Location: Left Arm)   Pulse 91   Temp 99 F (37.2 C) (Oral)   Resp 18   Ht _0  (1.626 m)   Wt 236 lb (107 kg)   SpO2 91%   BMI 40.51 kg/m   Physical Exam  Constitutional: She is oriented to person, place, and time. She appears well-developed and well-nourished. No distress.  HENT:  Head: Normocephalic and atraumatic.  Mouth/Throat: Oropharynx is clear and moist.  Erythematous oropharynx. Difficult to visualize fully. Bilateral nasal mucosal edema.    Eyes: EOM are normal. Pupils are equal, round, and reactive to light. Right eye exhibits no discharge.  Neck: Normal range of motion. Neck supple.  No meningismus  Cardiovascular: Normal rate and regular rhythm.  Exam reveals no gallop and no friction rub.   No murmur heard. Pulmonary/Chest: Effort normal.  Expiratory wheezing and prolonged expiratory phase.  Abdominal: Soft. Bowel sounds are normal. She exhibits no distension. There is tenderness (epigastric and left upper quadrant tenderness to palpation.). There is no rebound and no guarding.  Musculoskeletal: Normal range of motion. She exhibits no edema or tenderness.  No CVA tenderness bilaterally. No midline thoracic or lumbar tenderness. No lower extremity swelling, asymmetry or tenderness. Distal pulses intact.  Lymphadenopathy:    She has no cervical adenopathy.  Neurological: She is alert and oriented to person, place, and time.  Moves all extremities without deficit. Sensation fully intact.  Skin: Skin is warm and dry. Capillary refill takes less than 2 seconds. No rash noted. No erythema.  Psychiatric: She has a normal mood and affect. Her behavior is normal.  Nursing note and vitals reviewed.    ED Treatments / Results  Labs (all labs ordered are listed, but only abnormal results are displayed) Labs Reviewed  CBC WITH DIFFERENTIAL/PLATELET - Abnormal; Notable for the following:       Result Value   WBC 2.0 (*)    RBC 3.54 (*)    Hemoglobin 11.3 (*)    HCT 32.4 (*)    RDW 16.0 (*)    Neutro Abs 1.1 (*)    All other components within normal limits  COMPREHENSIVE METABOLIC PANEL - Abnormal; Notable for the following:    Glucose, Bld 159 (*)    Calcium 8.8 (*)    GFR calc non Af Amer 56 (*)    All other components within normal limits  URINALYSIS, ROUTINE W REFLEX MICROSCOPIC - Abnormal; Notable for the following:    APPearance HAZY (*)    Hgb urine dipstick MODERATE (*)    Ketones, ur 5 (*)    Leukocytes, UA  LARGE (*)    Bacteria, UA MANY (*)    Squamous Epithelial / LPF 0-5 (*)    All other components within normal limits  INFLUENZA PANEL BY PCR (TYPE A & B) - Abnormal; Notable for the following:    Influenza A By PCR POSITIVE (*)  All other components within normal limits  RAPID STREP SCREEN (NOT AT [facility])  CULTURE, GROUP A STREP [facility])  BRAIN NATRIURETIC PEPTIDE  LIPASE, BLOOD  I-STAT TROPOININ, ED    EKG  EKG Interpretation None       Radiology Dg Chest 2 View  Result Date: 07/22/2016 CLINICAL DATA:  Cough, congestion, sob, body aches, food intolerance since Tuesday, states hx of breast ca with mets to lungs, no other chest complaints this a.m, EXAM: CHEST  2 VIEW COMPARISON:  CT of 06/22/2016. Most recent chest radiograph of 05/10/2016. FINDINGS: Mild pectus excavatum deformity. Mild right hemidiaphragm elevation. Cholecystectomy. Midline trachea. Normal heart size and mediastinal contours. No pleural effusion or pneumothorax. Mild nonspecific perihilar and lower lobe predominant interstitial thickening. No well-defined pulmonary nodule. No lobar consolidation. IMPRESSION: No acute process or explanation for cough. Electronically Signed   By: Abigail Miyamoto M.D.   On: 07/22/2016 10:15    Procedures Procedures (including critical care time)  Medications Ordered in ED Medications  ondansetron (ZOFRAN) injection 4 mg (4 mg Intravenous Not Given 07/22/16 0931)  albuterol (PROVENTIL,VENTOLIN) solution continuous neb (10 mg/hr Nebulization New Bag/Given 07/22/16 1322)  enoxaparin (LOVENOX) injection 40 mg (not administered)  acetaminophen (TYLENOL) tablet 650 mg (not administered)    Or  acetaminophen (TYLENOL) suppository 650 mg (not administered)  ibuprofen (ADVIL,MOTRIN) tablet 600 mg (not administered)  ondansetron (ZOFRAN) tablet 4 mg (not administered)    Or  ondansetron (ZOFRAN) injection 4 mg (not administered)  bisacodyl (DULCOLAX) EC tablet 5 mg (not administered)    oseltamivir (TAMIFLU) capsule 75 mg (not administered)  gi cocktail (Maalox,Lidocaine,Donnatal) (30 mLs Oral Given 07/22/16 0942)  albuterol (PROVENTIL) (2.5 MG/3ML) 0.083% nebulizer solution 5 mg (5 mg Nebulization Given 07/22/16 0923)  methylPREDNISolone sodium succinate (SOLU-MEDROL) 125 mg/2 mL injection 125 mg (125 mg Intravenous Given 07/22/16 0936)  sodium chloride 0.9 % bolus 500 mL (0 mLs Intravenous Stopped 07/22/16 1108)  cefTRIAXone (ROCEPHIN) 1 g in dextrose 5 % 50 mL IVPB (0 g Intravenous Stopped 07/22/16 1446)     Initial Impression / Assessment and Plan / ED Course  I have reviewed the triage vital signs and the nursing notes.  Pertinent labs & imaging results that were available during my care of the patient were reviewed by me and considered in my medical decision making (see chart for details).    Patient with persistent wheezing despite breathing treatment & solumedrol. Noted to drop saturations into the 80s with good waveform. Flu a positive. Also evidence of urinary tract infection. Discussed with hospitalist and we'll admit   Final Clinical Impressions(s) / ED Diagnoses   Final diagnoses:  Influenza A  Hypoxia  Acute lower UTI    New Prescriptions Current Discharge Medication List       Julianne Rice, MD 07/22/16 1621

## 2016-07-22 NOTE — H&P (Addendum)
History and Physical    [name] EYC:[id number] DOB: March 02, 1946  DOA: 07/22/2016 PCP: Cathlean Cower, MD  Patient coming from: Home  Chief Complaint: Shortness of breath and cough  HPI: [name] is a 71 y.o. female with medical history significant of breast cancer with lung metastasis, hypertension, diabetes. Presented to the ED and planing of acute shortness of breath for the past 3 days. Patient reported symptoms started on Tuesday with difficulty breathing, cough and malaise. Subsequently she had dizziness, body aches, chills, nausea and vomiting. Patient report that her husband also was sick with influenza. Other associated symptoms include sore throat rhinorrhea subjective fever and chills. She has been taking ibuprofen every 4-5 hours with no improvement.  ED Course: Was found to have positive influenza and grossly normal UA. Per EDP patient was a setting to the mid 80s with ambulation. Requiring oxygen to bring O2 up back to the mid 90s. So was noted to have severe wheezing for which was given a continuous nebulizer and steroids. Patient was given Solu-Medrol and ceftriaxone in the ED.  Review of Systems:   General:  See HPI  HEENT: See HPI Respiratory: See HPI CV: no chest pain, no palpitations GI:  positive for abdominal pain and nausea. Negative for blood in stool, constipation and diarrhea.  GU: no dysuria, burning on urination, increased urinary frequency, hematuria  Ext:. No deformities,  Neuro: Positive for generalized weakness and lightheadedness. Negative for unilateral weakness Skin: No rashes, lsions or woundse. MSK: No muscle spasm, no deformity, no limitation of range of movement in spin Heme: No easy bruising. .   Past Medical History:  Diagnosis Date  . Cancer ([place]) 02/2016   right breast  . DIABETES MELLITUS, TYPE II 01/04/2007   only takes actoplus daily  . Dizziness and giddiness 02/29/2008  . DVT, HX OF    at age 71 in right buttocks  . Family  history of breast cancer   . GERD 01/04/2007   pt reports resolved   . GLAUCOMA 07/30/2008   both eyes  . History of blood transfusion    no abnormal  reaction  . History of uterine cancer 2000   hysterectomy done  . HYPERLIPIDEMIA 01/04/2007   taking Pravastatin daily  . HYPERTENSION 01/04/2007   takes Lisinopril daily  . Joint pain   . Joint swelling   . LEG PAIN, LEFT 07/06/2007  . NUMBNESS 07/30/2008   in fingers;pt states from Diamox  . OSTEOARTHRITIS, HIP 09/25/2009  . OTITIS MEDIA, ACUTE, BILATERAL 02/29/2008  . Overweight(278.02) 01/04/2007  . PONV (postoperative nausea and vomiting)   . SLEEP APNEA, OBSTRUCTIVE    doesn't use a cpap;study done about 23yr ago  . TRANSIENT ISCHEMIC ATTACK, HX OF 01/04/2007  . Vision loss    left eye    Past Surgical History:  Procedure Laterality Date  . ABDOMINAL HYSTERECTOMY  2000  . CHOLECYSTECTOMY    . ENDOBRONCHIAL ULTRASOUND Bilateral 03/28/2016   Procedure: ENDOBRONCHIAL ULTRASOUND;  Surgeon: RCollene Gobble MD;  Location: [facility];  Service: Cardiopulmonary;  Laterality: Bilateral;  . EYE SURGERY  13   shunt left and lazer eye surgery on right cataract and retenia tear with repair  . growth removal  2004   from thumb  . KNEE ARTHROSCOPY Right   . mulitple eye surgeries     both eyes, cataracts with ioc done both eyes  . OOPHORECTOMY    . TOTAL HIP ARTHROPLASTY  06/24/2011   Procedure: TOTAL HIP ARTHROPLASTY;  Surgeon: Kerin Salen;  Location: [facility];  Service: Orthopedics;  Laterality: Right;  . TOTAL HIP ARTHROPLASTY Left 11/12/2012   Dr Mayer Camel  . TOTAL HIP ARTHROPLASTY Left 11/12/2012   Procedure: TOTAL HIP ARTHROPLASTY;  Surgeon: Kerin Salen, MD;  Location: [facility];  Service: Orthopedics;  Laterality: Left;  DEPUY PINNACLE     reports that she has never smoked. She has never used smokeless tobacco. She reports that she does not drink alcohol or use drugs.  Allergies  Allergen Reactions  . Codeine Hives    Hycodan  syrup  . Fluorescein Nausea And Vomiting    ? IV dye for retina specialist  . Lipitor [Atorvastatin Calcium]     Leg cramp  . Oxycodone Nausea And Vomiting    Patient vomited for 3 days after taking  . Sitagliptin Phosphate Nausea And Vomiting  . Sulfa Drugs Cross Reactors Nausea And Vomiting    Family History  Problem Relation Age of Onset  . Dementia Mother   . Cancer Mother     Breast and lung cancer  . Stroke Sister   . Breast cancer Sister 18  . Heart attack Maternal Aunt   . Lung cancer Maternal Grandmother     non smoker  . Glaucoma Maternal Grandfather   . Anesthesia problems Neg Hx    Family history reviewed and non-pertinent.  Prior to Admission medications   Medication Sig Start Date End Date Taking? Authorizing Provider  acetaminophen (TYLENOL) 500 MG tablet Take 1,000 mg by mouth every 4 (four) hours as needed for moderate pain or fever.   Yes Historical Provider, MD  aspirin 81 MG tablet Take 81 mg by mouth daily. Reported on 06/12/2015   Yes Historical Provider, MD  bimatoprost (LUMIGAN) 0.01 % SOLN 1 drop into each eye once nightly.   Yes Historical Provider, MD  brimonidine-timolol (COMBIGAN) 0.2-0.5 % ophthalmic solution Place 1 drop into both eyes three times daily   Yes Historical Provider, MD  guaifenesin (ROBITUSSIN) 100 MG/5ML syrup Take 200 mg by mouth every 4 (four) hours as needed for cough or congestion.   Yes Historical Provider, MD  letrozole (FEMARA) 2.5 MG tablet Take 1 tablet (2.5 mg total) by mouth at bedtime. 03/28/16  Yes Collene Gobble, MD  lovastatin (MEVACOR) 20 MG tablet Take 1 tablet (20 mg total) by mouth every evening. 04/21/16  Yes Biagio Borg, MD  palbociclib [facility]) 100 MG capsule Take 1 capsule (100 mg total) by mouth daily with breakfast. Take whole with food. 06/23/16  Yes Chauncey Cruel, MD  pioglitazone-metformin (ACTOPLUS MET) 15-500 MG tablet Take 15-500 tablets by mouth daily. Patient taking differently: Take 1 tablet by  mouth daily.  04/21/16  Yes Biagio Borg, MD  prednisoLONE acetate (PRED FORTE) 1 % ophthalmic suspension Place 1 drop into both eyes every morning.    Yes Historical Provider, MD  prochlorperazine (COMPAZINE) 10 MG tablet Take 1 tablet (10 mg total) by mouth every 6 (six) hours as needed for nausea or vomiting. 04/18/16  Yes Chauncey Cruel, MD  Vitamin D, Cholecalciferol, 1000 units TABS Take 1,000 Units by mouth every evening.    Yes Historical Provider, MD    Physical Exam: Vitals:   07/22/16 1205 07/22/16 1325 07/22/16 1336 07/22/16 1406  BP: 155/65  182/75 161/89  Pulse: 80  86 83  Resp: '23  22 19  '$ Temp:   98.8 F (37.1 C)   TempSrc:   Oral   SpO2: 92%  94% 97% 100%  Weight:    107 kg (236 lb)  Height:    '5\' 4"'$  (1.626 m)     Constitutional: Mild distress due to shortness of breath, receiving nebulizer treatment Eyes: PERRL, lids and conjunctivae normal ENMT: Mucous membranes are moist. Posterior pharynx erythematous but no exudates  Neck: normal, supple, no masses, no thyromegaly Respiratory: Decreased breath sound bilaterally, expiratory wheezing 2:1,no rales or crackles Cardiovascular: Regular rate and rhythm, no murmurs / rubs / gallops. No extremity edema. 2+ pedal pulses.   Abdomen: Soft epigastric tenderness nondistended, no masses palpated. No hepatosplenomegaly. Bowel sounds positive.  Musculoskeletal: no clubbing / cyanosis. No joint deformity upper and lower extremities. Good ROM, no contractures. Normal muscle tone.  Skin: no rashes, lesions, ulcers. No induration Neurologic: CN 2-12 grossly intact. Sensation intact, Strength 5/5 in all 4.  Psychiatric: Normal judgment and insight. Alert and oriented x 3. Normal mood.    Labs on Admission: I have personally reviewed following labs and imaging studies  CBC:  Recent Labs Lab 07/19/16 1104 07/22/16 0931  WBC 3.5* 2.0*  NEUTROABS 1.9 1.1*  HGB 11.9 11.3*  HCT 35.0 32.4*  MCV 93.6 91.5  PLT 337 267   Basic  Metabolic Panel:  Recent Labs Lab 07/22/16 0931  NA 137  K 3.9  CL 103  CO2 26  GLUCOSE 159*  BUN 9  CREATININE 1.00  CALCIUM 8.8*   GFR: Estimated Creatinine Clearance: 62.5 mL/min (by C-G formula based on SCr of 1 mg/dL). Liver Function Tests:  Recent Labs Lab 07/22/16 0931  AST 17  ALT 16  ALKPHOS 43  BILITOT 0.3  PROT 7.4  ALBUMIN 4.1    Recent Labs Lab 07/22/16 0931  LIPASE 12   Urine analysis:    Component Value Date/Time   COLORURINE YELLOW 07/22/2016 1132   APPEARANCEUR HAZY (A) 07/22/2016 1132   LABSPEC 1.013 07/22/2016 1132   PHURINE 5.0 07/22/2016 1132   GLUCOSEU NEGATIVE 07/22/2016 1132   GLUCOSEU NEGATIVE 10/30/2014 1513   HGBUR MODERATE (A) 07/22/2016 1132   BILIRUBINUR NEGATIVE 07/22/2016 1132   KETONESUR 5 (A) 07/22/2016 1132   PROTEINUR NEGATIVE 07/22/2016 1132   UROBILINOGEN 0.2 10/30/2014 1513   NITRITE NEGATIVE 07/22/2016 1132   LEUKOCYTESUR LARGE (A) 07/22/2016 1132   ) Recent Results (from the past 240 hour(s))  Rapid strep screen     Status: None   Collection Time: 07/22/16  9:32 AM  Result Value Ref Range Status   Streptococcus, Group A Screen (Direct) NEGATIVE NEGATIVE Final    Comment: (NOTE) A Rapid Antigen test may result negative if the antigen level in the sample is below the detection level of this test. The FDA has not cleared this test as a stand-alone test therefore the rapid antigen negative result has reflexed to a Group A Strep culture.      Radiological Exams on Admission: Dg Chest 2 View  Result Date: 07/22/2016 CLINICAL DATA:  Cough, congestion, sob, body aches, food intolerance since Tuesday, states hx of breast ca with mets to lungs, no other chest complaints this a.m, EXAM: CHEST  2 VIEW COMPARISON:  CT of 06/22/2016. Most recent chest radiograph of 05/10/2016. FINDINGS: Mild pectus excavatum deformity. Mild right hemidiaphragm elevation. Cholecystectomy. Midline trachea. Normal heart size and mediastinal  contours. No pleural effusion or pneumothorax. Mild nonspecific perihilar and lower lobe predominant interstitial thickening. No well-defined pulmonary nodule. No lobar consolidation. IMPRESSION: No acute process or explanation for cough. Electronically Signed   By: [name] Ishihara  Jobe Igo M.D.   On: 07/22/2016 10:15    EKG: Independently reviewed. - Sinus rhythm, no ST segment changes  Assessment/Plan Acute respiratory failure with hypoxia due to influenza A - likely causing asthma exacerbation, patient is satting to the 80s with ambulation. Admit to obs Tamiflu 75 twice a day 5 days DuoNeb every 4 hours Prednisone 40 mg daily for 5 days. O2 as needed keep saturations above 91%. Check O2 with ambulation. Check strep urine antigen Mucinex when necessary cough  Asthma exacerbation due to influenza See above  HTN - BP elevated here, recently was taken off her BP meds - patient report her BP has been above 160's since then.  Will monitor for now  Will add hydralazine PRN for SBP > 160  Abnormal UA - doubt UTI patient is symptomatic, possible contaminant, no epithelial cells Patient received 1 dose of ceftriaxone in the ED We will hold antibiotics for now.  Diabetes mellitus type 2 On Actos plus metformin - will hold for now SSI Monitor CBG  History of breast cancer with lung metastasis Resume oral chemotherapy Hold Ibrance    DVT prophylaxis: Lovenox Code Status: Full Family Communication: None at bedside  Disposition Plan: Anticipate discharge to previous home environment.  Consults called: None Admission status: Med surg obs   Chipper Oman MD [facility] Pager: Text Page via [email or web address]  [phone]  If 7PM-7AM, please contact night-coverage [email or web address] Password [facility]  07/22/2016, 2:34 PM

## 2016-07-23 DIAGNOSIS — C50411 Malignant neoplasm of upper-outer quadrant of right female breast: Secondary | ICD-10-CM

## 2016-07-23 DIAGNOSIS — J45909 Unspecified asthma, uncomplicated: Secondary | ICD-10-CM | POA: Diagnosis not present

## 2016-07-23 DIAGNOSIS — J101 Influenza due to other identified influenza virus with other respiratory manifestations: Secondary | ICD-10-CM

## 2016-07-23 DIAGNOSIS — I10 Essential (primary) hypertension: Secondary | ICD-10-CM

## 2016-07-23 DIAGNOSIS — Z170 Estrogen receptor positive status [ER+]: Secondary | ICD-10-CM | POA: Diagnosis not present

## 2016-07-23 LAB — GLUCOSE, CAPILLARY: Glucose-Capillary: 147 mg/dL — ABNORMAL HIGH (ref 65–99)

## 2016-07-23 LAB — CBC
HCT: 34.7 % — ABNORMAL LOW (ref 36.0–46.0)
Hemoglobin: 11.9 g/dL — ABNORMAL LOW (ref 12.0–15.0)
MCH: 31.2 pg (ref 26.0–34.0)
MCHC: 34.3 g/dL (ref 30.0–36.0)
MCV: 91.1 fL (ref 78.0–100.0)
Platelets: 222 10*3/uL (ref 150–400)
RBC: 3.81 MIL/uL — ABNORMAL LOW (ref 3.87–5.11)
RDW: 16 % — ABNORMAL HIGH (ref 11.5–15.5)
WBC: 3.4 10*3/uL — ABNORMAL LOW (ref 4.0–10.5)

## 2016-07-23 LAB — BASIC METABOLIC PANEL
Anion gap: 9 (ref 5–15)
BUN: 12 mg/dL (ref 6–20)
CO2: 25 mmol/L (ref 22–32)
Calcium: 9.3 mg/dL (ref 8.9–10.3)
Chloride: 107 mmol/L (ref 101–111)
Creatinine, Ser: 0.88 mg/dL (ref 0.44–1.00)
GFR calc Af Amer: >60 mL/min (ref >60)
GFR calc non Af Amer: >60 mL/min (ref >60)
Glucose, Bld: 216 mg/dL — ABNORMAL HIGH (ref 65–99)
Potassium: 4 mmol/L (ref 3.5–5.1)
Sodium: 141 mmol/L (ref 135–145)

## 2016-07-23 MED ORDER — OSELTAMIVIR PHOSPHATE 75 MG PO CAPS: 75.0000 mg | ORAL_CAPSULE | Freq: Two times a day (BID) | ORAL | 0 refills | Status: DC

## 2016-07-23 MED ORDER — PREDNISONE 20 MG PO TABS: 40.0000 mg | ORAL_TABLET | Freq: Every day | ORAL | 0 refills | Status: DC

## 2016-07-23 NOTE — Progress Notes (Signed)
Patient d/c'd per order. IV d/c'd. Medications reviewed with patient at bedside. Pt verbalizes understanding of medications. Belongings gathered and patient transported via wheelchair to meet husband by [employer], NT.

## 2016-07-23 NOTE — Progress Notes (Signed)
O2 sat 97%(room air) after 75 foot walk gait steady/denies sob

## 2016-07-23 NOTE — Discharge Summary (Signed)
Physician Discharge Summary  [name] OZD:[id number] DOB: [birth year] DOA: 07/22/2016  PCP: Cathlean Cower, MD  Admit date: 07/22/2016 Discharge date: 07/23/2016  Admitted From: home  Disposition:  home  Recommendations for Outpatient Follow-up:  1. Follow up with PCP and Dr. Jana Hakim as needed 2. Follow up urine culture  Home Health:  home  Equipment/Devices:  none  Discharge Condition:  Stable, improved CODE STATUS:  full  Diet recommendation:  Diabetic diet  Brief/Interim Summary:  [name] is a 71 y.o. female with medical history significant of breast cancer with lung metastasis, hypertension, diabetes who presented to the ED complaining of acute shortness of breath and cough for the past 3 days.  She had associated dizziness, body aches, chills, nausea and vomiting.  She came to the ER because of dyspnea and was found to have positive influenza A and a possible UTI.  She was wheezing and was given duoneb, steroids, and starting on tamiflu.  She was also given a dose of ceftriaxone for possible UTI.  Discharge Diagnoses:  Active Problems:   Essential hypertension   Asthma   Malignant neoplasm of upper-outer quadrant of right breast in female, estrogen receptor positive ([place])   Lung metastases ([place])   Influenza A  Acute respiratory failure with hypoxia due to influenza A - possibly causing asthma exacerbation given wheezing.  She rapidly improved with steroids and duonebs.  Was requesting to go home on 2/10 and was able to ambulate in the hall without supplemental oxygen maintaining oxygen saturations in the mid-90s. -  Continue tamiflu -  Continue prednisone '40mg'$  daily for three more days -  Has albuterol inhaler at home as needed  HTN - BP initially elevated here but likely due to respiratory distress and trended down to 116/86 prior to discharge.  Abnormal UA but patient asymptomatic -  Patient received 1 dose of ceftriaxone in the ED -  Discontinued antibiotics and  advised her to call her PCP's office if she develops symptoms of UTI  Diabetes mellitus type 2, continue home medications  History of breast cancer with lung metastasis.  Continue chemotherapy since she improved rapidly and looks clinically well today.    Discharge Instructions  Discharge Instructions    Call MD for:  difficulty breathing, headache or visual disturbances    Complete by:  As directed    Call MD for:  extreme fatigue    Complete by:  As directed    Call MD for:  hives    Complete by:  As directed    Call MD for:  persistant dizziness or light-headedness    Complete by:  As directed    Call MD for:  persistant nausea and vomiting    Complete by:  As directed    Call MD for:  severe uncontrolled pain    Complete by:  As directed    Call MD for:  temperature >100.4    Complete by:  As directed    Diet Carb Modified    Complete by:  As directed    Increase activity slowly    Complete by:  As directed        Medication List    TAKE these medications   acetaminophen 500 MG tablet Commonly known as:  TYLENOL Take 1,000 mg by mouth every 4 (four) hours as needed for moderate pain or fever.   aspirin 81 MG tablet Take 81 mg by mouth daily. Reported on 06/12/2015   bimatoprost 0.01 % Soln Commonly  known as:  LUMIGAN 1 drop into each eye once nightly.   brimonidine-timolol 0.2-0.5 % ophthalmic solution Commonly known as:  COMBIGAN Place 1 drop into both eyes three times daily   guaifenesin 100 MG/5ML syrup Commonly known as:  ROBITUSSIN Take 200 mg by mouth every 4 (four) hours as needed for cough or congestion.   letrozole 2.5 MG tablet Commonly known as:  FEMARA Take 1 tablet (2.5 mg total) by mouth at bedtime.   lovastatin 20 MG tablet Commonly known as:  MEVACOR Take 1 tablet (20 mg total) by mouth every evening.   oseltamivir 75 MG capsule Commonly known as:  TAMIFLU Take 1 capsule (75 mg total) by mouth 2 (two) times daily.   palbociclib 100  MG capsule Commonly known as:  IBRANCE Take 1 capsule (100 mg total) by mouth daily with breakfast. Take whole with food.   pioglitazone-metformin 15-500 MG tablet Commonly known as:  ACTOPLUS MET Take 15-500 tablets by mouth daily. What changed:  how much to take   prednisoLONE acetate 1 % ophthalmic suspension Commonly known as:  PRED FORTE Place 1 drop into both eyes every morning.   predniSONE 20 MG tablet Commonly known as:  DELTASONE Take 2 tablets (40 mg total) by mouth daily with breakfast. Start taking on:  07/24/2016   prochlorperazine 10 MG tablet Commonly known as:  COMPAZINE Take 1 tablet (10 mg total) by mouth every 6 (six) hours as needed for nausea or vomiting.   Vitamin D (Cholecalciferol) 1000 units Tabs Take 1,000 Units by mouth every evening.      Follow-up Information    Cathlean Cower, MD Follow up.   Specialties:  Internal Medicine, Radiology Why:  as needed Contact information: [address] [place] [address] [phone]        Chauncey Cruel, MD Follow up.   Specialty:  Oncology Contact information: [address] [place] [address] [phone]          Allergies  Allergen Reactions  . Codeine Hives    Hycodan syrup  . Fluorescein Nausea And Vomiting    ? IV dye for retina specialist  . Lipitor [Atorvastatin Calcium]     Leg cramp  . Oxycodone Nausea And Vomiting    Patient vomited for 3 days after taking  . Sitagliptin Phosphate Nausea And Vomiting  . Sulfa Drugs Cross Reactors Nausea And Vomiting    Consultations: none   Procedures/Studies: Dg Chest 2 View  Result Date: 07/22/2016 CLINICAL DATA:  Cough, congestion, sob, body aches, food intolerance since Tuesday, states hx of breast ca with mets to lungs, no other chest complaints this a.m, EXAM: CHEST  2 VIEW COMPARISON:  CT of 06/22/2016. Most recent chest radiograph of 05/10/2016. FINDINGS: Mild pectus excavatum deformity. Mild right  hemidiaphragm elevation. Cholecystectomy. Midline trachea. Normal heart size and mediastinal contours. No pleural effusion or pneumothorax. Mild nonspecific perihilar and lower lobe predominant interstitial thickening. No well-defined pulmonary nodule. No lobar consolidation. IMPRESSION: No acute process or explanation for cough. Electronically Signed   By: Abigail Miyamoto M.D.   On: 07/22/2016 10:15    Subjective: Asking to go home.  Feels better today than she has in a while.  Breathing fine.  Denies nausea.  Didn't sleep much but feeling very well today.    Discharge Exam: Vitals:   07/22/16 2127 07/23/16 0603  BP: (!) 166/77 116/86  Pulse: 79 78  Resp: 18 18  Temp: 98.6 F (37 C) 98.5 F (  36.9 C)   Vitals:   07/22/16 1925 07/22/16 2127 07/23/16 0603 07/23/16 0835  BP:  (!) 166/77 116/86   Pulse:  79 78   Resp:  18 18   Temp:  98.6 F (37 C) 98.5 F (36.9 C)   TempSrc:  Oral Oral   SpO2: 98% 94% 96% 99%  Weight:      Height:        General: Pt is alert, awake, not in acute distress, dressed and sitting up in bed Cardiovascular: RRR, S1/S2 +, no rubs, no gallops Respiratory: diminished bilateral breath sounds with end-expiratory wheeze, no rhonchi or rales.   Abdominal: Soft, NT, ND, bowel sounds + Extremities: no edema, no cyanosis    The results of significant diagnostics from this hospitalization (including imaging, microbiology, ancillary and laboratory) are listed below for reference.     Microbiology: Recent Results (from the past 240 hour(s))  Rapid strep screen     Status: None   Collection Time: 07/22/16  9:32 AM  Result Value Ref Range Status   Streptococcus, Group A Screen (Direct) NEGATIVE NEGATIVE Final    Comment: (NOTE) A Rapid Antigen test may result negative if the antigen level in the sample is below the detection level of this test. The FDA has not cleared this test as a stand-alone test therefore the rapid antigen negative result has reflexed to a  Group A Strep culture.   Culture, group A strep     Status: None (Preliminary result)   Collection Time: 07/22/16 10:25 AM  Result Value Ref Range Status   Specimen Description THROAT  Final   Special Requests NONE  Final   Culture   Final    CULTURE REINCUBATED FOR BETTER GROWTH Performed at [facility], [place] [address] [place], [place] [address]    Report Status PENDING  Incomplete     Labs: BNP (last 3 results)  Recent Labs  07/22/16 0931  BNP 60.4   Basic Metabolic Panel:  Recent Labs Lab 07/22/16 0931 07/23/16 0550  NA 137 141  K 3.9 4.0  CL 103 107  CO2 26 25  GLUCOSE 159* 216*  BUN 9 12  CREATININE 1.00 0.88  CALCIUM 8.8* 9.3   Liver Function Tests:  Recent Labs Lab 07/22/16 0931  AST 17  ALT 16  ALKPHOS 43  BILITOT 0.3  PROT 7.4  ALBUMIN 4.1    Recent Labs Lab 07/22/16 0931  LIPASE 12   No results for input(s): AMMONIA in the last 168 hours. CBC:  Recent Labs Lab 07/19/16 1104 07/22/16 0931 07/23/16 0550  WBC 3.5* 2.0* 3.4*  NEUTROABS 1.9 1.1*  --   HGB 11.9 11.3* 11.9*  HCT 35.0 32.4* 34.7*  MCV 93.6 91.5 91.1  PLT 337 211 222   Cardiac Enzymes: No results for input(s): CKTOTAL, CKMB, CKMBINDEX, TROPONINI in the last 168 hours. BNP: Invalid input(s): POCBNP CBG:  Recent Labs Lab 07/22/16 1729 07/22/16 2130 07/23/16 0805  GLUCAP 382* 235* 147*   D-Dimer No results for input(s): DDIMER in the last 72 hours. Hgb A1c No results for input(s): HGBA1C in the last 72 hours. Lipid Profile No results for input(s): CHOL, HDL, LDLCALC, TRIG, CHOLHDL, LDLDIRECT in the last 72 hours. Thyroid function studies No results for input(s): TSH, T4TOTAL, T3FREE, THYROIDAB in the last 72 hours.  Invalid input(s): FREET3 Anemia work up No results for input(s): VITAMINB12, FOLATE, FERRITIN, TIBC, IRON, RETICCTPCT in the last 72 hours. Urinalysis    Component Value  Date/Time   COLORURINE YELLOW 07/22/2016 1132   APPEARANCEUR  HAZY (A) 07/22/2016 1132   LABSPEC 1.013 07/22/2016 1132   PHURINE 5.0 07/22/2016 1132   GLUCOSEU NEGATIVE 07/22/2016 1132   GLUCOSEU NEGATIVE 10/30/2014 1513   HGBUR MODERATE (A) 07/22/2016 1132   BILIRUBINUR NEGATIVE 07/22/2016 1132   KETONESUR 5 (A) 07/22/2016 1132   PROTEINUR NEGATIVE 07/22/2016 1132   UROBILINOGEN 0.2 10/30/2014 1513   NITRITE NEGATIVE 07/22/2016 1132   LEUKOCYTESUR LARGE (A) 07/22/2016 1132   Sepsis Labs Invalid input(s): PROCALCITONIN,  WBC,  LACTICIDVEN   Time coordinating discharge: Over 30 minutes  SIGNED:   Janece Canterbury, MD  [facility] 07/23/2016, 3:23 PM Pager   If 7PM-7AM, please contact night-coverage [email or web address] Password [email or web address]

## 2016-07-24 LAB — CULTURE, GROUP A STREP (THRC)

## 2016-07-25 ENCOUNTER — Telehealth: Payer: Self-pay | Admitting: *Deleted

## 2016-07-25 NOTE — Telephone Encounter (Signed)
Tried calling pt to set-up TCM appt no answer LMOM RTC.../lmb 

## 2016-07-26 ENCOUNTER — Other Ambulatory Visit (HOSPITAL_BASED_OUTPATIENT_CLINIC_OR_DEPARTMENT_OTHER): Payer: Medicare Other

## 2016-07-26 ENCOUNTER — Ambulatory Visit (HOSPITAL_BASED_OUTPATIENT_CLINIC_OR_DEPARTMENT_OTHER): Payer: Medicare Other | Admitting: Oncology

## 2016-07-26 VITALS — BP 170/76 | HR 61 | Temp 98.1°F | Resp 18 | Ht 64.0 in | Wt 239.6 lb

## 2016-07-26 DIAGNOSIS — Z170 Estrogen receptor positive status [ER+]: Secondary | ICD-10-CM

## 2016-07-26 DIAGNOSIS — C773 Secondary and unspecified malignant neoplasm of axilla and upper limb lymph nodes: Secondary | ICD-10-CM

## 2016-07-26 DIAGNOSIS — C7801 Secondary malignant neoplasm of right lung: Secondary | ICD-10-CM

## 2016-07-26 DIAGNOSIS — C7802 Secondary malignant neoplasm of left lung: Secondary | ICD-10-CM | POA: Diagnosis not present

## 2016-07-26 DIAGNOSIS — Z8542 Personal history of malignant neoplasm of other parts of uterus: Secondary | ICD-10-CM

## 2016-07-26 DIAGNOSIS — C50411 Malignant neoplasm of upper-outer quadrant of right female breast: Secondary | ICD-10-CM | POA: Diagnosis not present

## 2016-07-26 DIAGNOSIS — K760 Fatty (change of) liver, not elsewhere classified: Secondary | ICD-10-CM

## 2016-07-26 LAB — CBC WITH DIFFERENTIAL/PLATELET
BASO%: 0 % (ref 0.0–2.0)
Basophils Absolute: 0 10*3/uL (ref 0.0–0.1)
EOS%: 0 % (ref 0.0–7.0)
Eosinophils Absolute: 0 10*3/uL (ref 0.0–0.5)
HCT: 32.5 % — ABNORMAL LOW (ref 34.8–46.6)
HGB: 11.4 g/dL — ABNORMAL LOW (ref 11.6–15.9)
LYMPH%: 38.8 % (ref 14.0–49.7)
MCH: 31.8 pg (ref 25.1–34.0)
MCHC: 35.1 g/dL (ref 31.5–36.0)
MCV: 90.8 fL (ref 79.5–101.0)
MONO#: 0.3 10*3/uL (ref 0.1–0.9)
MONO%: 8.5 % (ref 0.0–14.0)
NEUT#: 1.6 10*3/uL (ref 1.5–6.5)
NEUT%: 52.7 % (ref 38.4–76.8)
Platelets: 148 10*3/uL (ref 145–400)
RBC: 3.58 10*6/uL — ABNORMAL LOW (ref 3.70–5.45)
RDW: 15.9 % — ABNORMAL HIGH (ref 11.2–14.5)
WBC: 3.1 10*3/uL — ABNORMAL LOW (ref 3.9–10.3)
lymph#: 1.2 10*3/uL (ref 0.9–3.3)

## 2016-07-26 LAB — COMPREHENSIVE METABOLIC PANEL
ALT: 19 U/L (ref 0–55)
AST: 15 U/L (ref 5–34)
Albumin: 3.7 g/dL (ref 3.5–5.0)
Alkaline Phosphatase: 45 U/L (ref 40–150)
Anion Gap: 10 mEq/L (ref 3–11)
BUN: 16.1 mg/dL (ref 7.0–26.0)
CO2: 25 mEq/L (ref 22–29)
Calcium: 9.1 mg/dL (ref 8.4–10.4)
Chloride: 102 mEq/L (ref 98–109)
Creatinine: 0.9 mg/dL (ref 0.6–1.1)
EGFR: 67 mL/min/{1.73_m2} — ABNORMAL LOW (ref >90)
Glucose: 224 mg/dl — ABNORMAL HIGH (ref 70–140)
Potassium: 3.6 mEq/L (ref 3.5–5.1)
Sodium: 138 mEq/L (ref 136–145)
Total Bilirubin: 0.51 mg/dL (ref 0.20–1.20)
Total Protein: 6.4 g/dL (ref 6.4–8.3)

## 2016-07-26 MED ORDER — PALBOCICLIB 100 MG PO CAPS: 100.0000 mg | ORAL_CAPSULE | Freq: Every day | ORAL | 6 refills | Status: DC

## 2016-07-26 MED FILL — IBRANCE 100 MG CAPSULE: 100 | 28 days supply | Qty: 21 | Fill #0

## 2016-07-26 NOTE — Telephone Encounter (Signed)
Called pt again still no answer LMOM to call office to make hosp f/u appt w/Dr. Jenny Reichmann...[name]

## 2016-07-26 NOTE — Progress Notes (Signed)
[name]  Telephone:([phone]) [phone] Fax:(336) [phone]     ID: [name] DOB: 12-02-1945  MR#: [id number]  WPY#:[id number]  Patient Care Team: Biagio Borg, MD as PCP - General Alphonsa Overall, MD as Consulting Physician (General Surgery) Chauncey Cruel, MD as Consulting Physician (Oncology) Kyung Rudd, MD as Consulting Physician (Radiation Oncology) Bobbye Charleston, MD as Consulting Physician (Obstetrics and Gynecology) Nada Libman, MD as Referring Physician (Specialist) Vevelyn Royals, MD as Consulting Physician (Ophthalmology) Collene Gobble, MD as Consulting Physician (Pulmonary Disease) OTHER MD:  CHIEF COMPLAINT: Estrogen receptor positive breast cancer  CURRENT TREATMENT: Letrozole, palbociclib   BREAST CANCER HISTORY: From the original intake note:  Jaxson had screening mammography showing some suspicious calcifications in the right breast leading to right diagnostic mammography with ultrasonography 01/22/2016 at [facility]. The breast density was category C. In the upper right breast there was a 2.3 cm mass with additional masses measuring 0.9 and 0.7 cm. There was also a possible additional 0.8 mass in the lower inner quadrant. Ultrasound confirmed an irregular hypoechoic mass in the right breast upper outer quadrant measuring 2.0 cm. There were other masses measuring 0.7 and 0.8 cm by ultrasonography. The right axilla was sonographically benign.  Biopsy of a 12:00 and 4:00 mass in the right breast 01/22/2016 showed ([id number]) both specimens showing invasive ductal carcinoma, grade 1 or 2, both 95% estrogen receptor positive, both 95% progesterone receptor positive, both with strong staining intensity, with MIB-1 ranging from 10-15%, and both HER-2 negative, the signals ratio being 1.23-1.42, and the number per cell 1.85-2.59.  Her subsequent history is as detailed below  INTERVAL HISTORY: [name] returns today for follow-up of her metastatic estrogen  receptor positive breast cancer accompanied by [name]. Since the last visit here she had a brief hospitalization. She felt extremely tired and dizzy. Went to the emergency room, was found to be dehydrated, to have her urinary tract infection, and also to have the flu. She was treated with Ander Gaster biotics, as well as Tamiflu and steroids. This shot her sugar up and she was briefly on insulin. All those problems are now better although she still has very loose bowel movements because of the antibiotics.  She continued with the letrozole, but stopped the palbociclib as of February 16, missing approximately 4 days from her final week of that cycle  REVIEW OF SYSTEMS: [name] is doing very well with the letrozole, and doesn't have any significant side effects from it that she is aware of. She also is very committed to the palbociclib and tolerates it well. There continued to be some family issues which are not further discussed here. Aside from that a detailed review of systems today was stable  PAST MEDICAL HISTORY: Past Medical History:  Diagnosis Date  . Cancer ([place]) 02/2016   right breast  . DIABETES MELLITUS, TYPE II 01/04/2007   only takes actoplus daily  . Dizziness and giddiness 02/29/2008  . DVT, HX OF    at age 41 in right buttocks  . Family history of breast cancer   . GERD 01/04/2007   pt reports resolved   . GLAUCOMA 07/30/2008   both eyes  . History of blood transfusion    no abnormal  reaction  . History of uterine cancer 2000   hysterectomy done  . HYPERLIPIDEMIA 01/04/2007   taking Pravastatin daily  . HYPERTENSION 01/04/2007   takes Lisinopril daily  . Joint pain   . Joint swelling   .  LEG PAIN, LEFT 07/06/2007  . NUMBNESS 07/30/2008   in fingers;pt states from Diamox  . OSTEOARTHRITIS, HIP 09/25/2009  . OTITIS MEDIA, ACUTE, BILATERAL 02/29/2008  . Overweight(278.02) 01/04/2007  . PONV (postoperative nausea and vomiting)   . SLEEP APNEA, OBSTRUCTIVE    doesn't  use a cpap;study done about 24yr ago  . TRANSIENT ISCHEMIC ATTACK, HX OF 01/04/2007  . Vision loss    left eye    PAST SURGICAL HISTORY: Past Surgical History:  Procedure Laterality Date  . ABDOMINAL HYSTERECTOMY  2000  . CHOLECYSTECTOMY    . ENDOBRONCHIAL ULTRASOUND Bilateral 03/28/2016   Procedure: ENDOBRONCHIAL ULTRASOUND;  Surgeon: RCollene Gobble MD;  Location: [facility];  Service: Cardiopulmonary;  Laterality: Bilateral;  . EYE SURGERY  13   shunt left and lazer eye surgery on right cataract and retenia tear with repair  . growth removal  2004   from thumb  . KNEE ARTHROSCOPY Right   . mulitple eye surgeries     both eyes, cataracts with ioc done both eyes  . OOPHORECTOMY    . TOTAL HIP ARTHROPLASTY  06/24/2011   Procedure: TOTAL HIP ARTHROPLASTY;  Surgeon: FKerin Salen  [facility]  Service: Orthopedics;  Laterality: Right;  . TOTAL HIP ARTHROPLASTY Left 11/12/2012   Dr RMayer Camel . TOTAL HIP ARTHROPLASTY Left 11/12/2012   Procedure: TOTAL HIP ARTHROPLASTY;  Surgeon: FKerin Salen MD;  [facility]  Service: Orthopedics;  Laterality: Left;  DEPUY PINNACLE    FAMILY HISTORY Family History  Problem Relation Age of Onset  . Dementia Mother   . Cancer Mother     Breast and lung cancer  . Stroke Sister   . Breast cancer Sister 581 . Heart attack Maternal Aunt   . Lung cancer Maternal Grandmother     non smoker  . Glaucoma Maternal Grandfather   . Anesthesia problems Neg Hx   The patient's father died at age 71 the patient's mother died at age 71 She had breast and lung cancers diagnosed shortly before her death. The patient had no brothers, 2 sisters. One sister was diagnosed with breast cancer at the age of 558  GYNECOLOGIC HISTORY:  No LMP recorded. Patient has had a hysterectomy. Menarche age 71 first live birth age 71 the patient is GX P1. She had a hysterectomy for endometrial cancer in the year 2000. She did not take hormone replacement. She did use  oral contraceptives for your than 20 years remotely, with no complications.  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 She is home with her husband [name] He is a retired mDealerTheir son CJuanda [place]also lives in [place]    ADVANCED DIRECTIVES: In place  HEALTH MAINTENANCE: Social History  Substance Use Topics  . Smoking status: Never Smoker  . Smokeless tobacco: Never Used  . Alcohol use No     Colonoscopy: Never  PAP: Status post hysterectomy  Bone density: Remote   Allergies  Allergen Reactions  . Codeine Hives    Hycodan syrup  . Fluorescein Nausea And Vomiting    ? IV dye for retina specialist  . Lipitor [Atorvastatin Calcium]     Leg cramp  . Oxycodone Nausea And Vomiting    Patient vomited for 3 days after taking  . Sitagliptin Phosphate Nausea And Vomiting  . Sulfa Drugs Cross Reactors Nausea And Vomiting    Current Outpatient Prescriptions  Medication Sig Dispense Refill  . acetaminophen (TYLENOL) 500 MG tablet Take 1,000 mg by mouth every 4 (four) hours as needed for moderate pain or fever.    Marland Kitchen aspirin 81 MG tablet Take 81 mg by mouth daily. Reported on 06/12/2015    . bimatoprost (LUMIGAN) 0.01 % SOLN 1 drop into each eye once nightly.    . brimonidine-timolol (COMBIGAN) 0.2-0.5 % ophthalmic solution Place 1 drop into both eyes three times daily    . guaifenesin (ROBITUSSIN) 100 MG/5ML syrup Take 200 mg by mouth every 4 (four) hours as needed for cough or congestion.    Marland Kitchen letrozole (FEMARA) 2.5 MG tablet Take 1 tablet (2.5 mg total) by mouth at bedtime.    . lovastatin (MEVACOR) 20 MG tablet Take 1 tablet (20 mg total) by mouth every evening. 90 tablet 3  . oseltamivir (TAMIFLU) 75 MG capsule Take 1 capsule (75 mg total) by mouth 2 (two) times daily. 7 capsule 0  . palbociclib (IBRANCE) 100 MG capsule Take 1 capsule (100 mg total) by mouth daily with breakfast. Take whole  with food. 21 capsule 6  . pioglitazone-metformin (ACTOPLUS MET) 15-500 MG tablet Take 15-500 tablets by mouth daily. (Patient taking differently: Take 1 tablet by mouth daily. ) 90 tablet 3  . prednisoLONE acetate (PRED FORTE) 1 % ophthalmic suspension Place 1 drop into both eyes every morning.     . predniSONE (DELTASONE) 20 MG tablet Take 2 tablets (40 mg total) by mouth daily with breakfast. 6 tablet 0  . prochlorperazine (COMPAZINE) 10 MG tablet Take 1 tablet (10 mg total) by mouth every 6 (six) hours as needed for nausea or vomiting. 30 tablet 0  . Vitamin D, Cholecalciferol, 1000 units TABS Take 1,000 Units by mouth every evening.      No current facility-administered medications for this visit.     OBJECTIVE: Middle-aged white womanIn no acute distress  Vitals:   07/26/16 1159  BP: (!) 170/76  Pulse: 61  Resp: 18  Temp: 98.1 F (36.7 C)     Body mass index is 41.13 kg/m.    ECOG FS:1 - Symptomatic but completely ambulatory  Sclerae unicteric, pupils round and equal Oropharynx clear and moist-- no thrush or other lesions No cervical or supraclavicular adenopathy Lungs no rales or rhonchi Heart regular rate and rhythm Abd soft, nontender, positive bowel sounds MSK no focal spinal tenderness, no upper extremity lymphedema Neuro: nonfocal, well oriented, appropriate affect Breasts: I do not palpate a mass in either breast. Both axillae are benign.   LAB RESULTS:  CMP     Component Value Date/Time   NA 141 07/23/2016 0550   NA 142 06/28/2016 1118   K 4.0 07/23/2016 0550   K 4.4 06/28/2016 1118   CL 107 07/23/2016 0550   CO2 25 07/23/2016 0550   CO2 25 06/28/2016 1118   GLUCOSE 216 (H) 07/23/2016 0550   GLUCOSE 192 (H) 06/28/2016 1118   BUN 12 07/23/2016 0550   BUN 13.0 06/28/2016 1118   CREATININE 0.88 07/23/2016 0550   CREATININE 0.9 06/28/2016 1118   CALCIUM 9.3 07/23/2016 0550   CALCIUM 9.2 06/28/2016 1118   PROT 7.4 07/22/2016 0931   PROT 6.6 06/28/2016  1118   ALBUMIN 4.1 07/22/2016 0931   ALBUMIN 3.8 06/28/2016 1118   AST 17 07/22/2016 0931   AST 12 06/28/2016 1118   ALT 16 07/22/2016 0931   ALT 15 06/28/2016 1118   ALKPHOS 43 07/22/2016 0931   ALKPHOS 46 06/28/2016  1118   BILITOT 0.3 07/22/2016 0931   BILITOT 0.67 06/28/2016 1118   GFRNONAA >60 07/23/2016 0550   GFRAA >60 07/23/2016 0550    INo results found for: SPEP, UPEP  Lab Results  Component Value Date   WBC 3.1 (L) 07/26/2016   NEUTROABS 1.6 07/26/2016   HGB 11.4 (L) 07/26/2016   HCT 32.5 (L) 07/26/2016   MCV 90.8 07/26/2016   PLT 148 07/26/2016      Chemistry      Component Value Date/Time   NA 141 07/23/2016 0550   NA 142 06/28/2016 1118   K 4.0 07/23/2016 0550   K 4.4 06/28/2016 1118   CL 107 07/23/2016 0550   CO2 25 07/23/2016 0550   CO2 25 06/28/2016 1118   BUN 12 07/23/2016 0550   BUN 13.0 06/28/2016 1118   CREATININE 0.88 07/23/2016 0550   CREATININE 0.9 06/28/2016 1118      Component Value Date/Time   CALCIUM 9.3 07/23/2016 0550   CALCIUM 9.2 06/28/2016 1118   ALKPHOS 43 07/22/2016 0931   ALKPHOS 46 06/28/2016 1118   AST 17 07/22/2016 0931   AST 12 06/28/2016 1118   ALT 16 07/22/2016 0931   ALT 15 06/28/2016 1118   BILITOT 0.3 07/22/2016 0931   BILITOT 0.67 06/28/2016 1118       No results found for: LABCA2  No components found for: LABCA125  No results for input(s): INR in the last 168 hours.  Urinalysis    Component Value Date/Time   COLORURINE YELLOW 07/22/2016 1132   APPEARANCEUR HAZY (A) 07/22/2016 1132   LABSPEC 1.013 07/22/2016 1132   PHURINE 5.0 07/22/2016 1132   GLUCOSEU NEGATIVE 07/22/2016 1132   GLUCOSEU NEGATIVE 10/30/2014 1513   HGBUR MODERATE (A) 07/22/2016 1132   BILIRUBINUR NEGATIVE 07/22/2016 1132   KETONESUR 5 (A) 07/22/2016 1132   PROTEINUR NEGATIVE 07/22/2016 1132   UROBILINOGEN 0.2 10/30/2014 1513   NITRITE NEGATIVE 07/22/2016 1132   LEUKOCYTESUR LARGE (A) 07/22/2016 1132     STUDIES: Dg Chest 2  View  Result Date: 07/22/2016 CLINICAL DATA:  Cough, congestion, sob, body aches, food intolerance since Tuesday, states hx of breast ca with mets to lungs, no other chest complaints this a.m, EXAM: CHEST  2 VIEW COMPARISON:  CT of 06/22/2016. Most recent chest radiograph of 05/10/2016. FINDINGS: Mild pectus excavatum deformity. Mild right hemidiaphragm elevation. Cholecystectomy. Midline trachea. Normal heart size and mediastinal contours. No pleural effusion or pneumothorax. Mild nonspecific perihilar and lower lobe predominant interstitial thickening. No well-defined pulmonary nodule. No lobar consolidation. IMPRESSION: No acute process or explanation for cough. Electronically Signed   By: Abigail Miyamoto M.D.   On: 07/22/2016 10:15     ELIGIBLE FOR AVAILABLE RESEARCH PROTOCOL: no  ASSESSMENT: 71 y.o. [place] woman with a remote history of early stage endometrial cancer, now status post right breast upper outer quadrant biopsy 01/22/2016 for a clinically multifocal T2 N0, stage 2A invasive ductal carcinoma, grade 1, estrogen and progesterone receptor positive, HER-2 negative, with an MIB-1 between 10 and 15%.  (1) right axillary lymph node biopsy 03/02/2016 positive  (2) genetics testing 01/13/2016 through the Custom gene panel offered by [facility] found no deleterious mutations in  ATM, BARD1, BRCA1, BRCA2, BRIP1, CDH1, CHEK2, EPCAM, FANCC, MLH1, MSH2, MSH6, MUTYH, NBN, PALB2, PMS2, POLD1, PTEN, RAD51C, RAD51D, TP53, and XRCC2  METASTATIC DISEASE: OCT 2017 (3) CT scans of the chest abdomen and pelvis obtained 03/10/2016 are consistent with bilateral lung metastases and mediastinal and hilar nodal involvement,  but no liver or bone spread  (a) bronchoscopic lymph node biopsy 2 (station 7, 13R) 03/28/2016 confirms metastatic adenocarcinoma, estrogen receptor positive, HER-2 not amplified  (b) baseline CA-27-29 on 04/18/2016 was 137.5.  (4) letrozole started 03/15/2016, palbociclib added  03/29/2016 at 125 mg/day, 21/7  (a) dose decreased to 100 mg per day, 21/7, beginning with February cycle  PLAN: I spent approximately 30 minutes with [name] with most of that time spent discussing her complex problems. She had a rough week, with the flu, urinary tract infection, dehydration, and luckily all that a resolved. Her blood sugars are now better controlled and she is off the prednisone.  She had an unusual cycle of palbociclib last month because she had the 125 mg and we wanted to drop the dose so for a few days she took them every other day but then she ended up taking them daily andthen she missed the last few doses because of the intercurrent infections.  She is not on her off week and she will start at a lower dose, namely 100 mg daily, beginning 08/02/2016.  At this point I feel comfortable checking her lab work on a once a month basis. She will see me again 08/30/2016 with lab work that same day. If everything is stable at that time I will start seeing her every other month, continuing lab work on a monthly basis.  We will restage her sometime around May or June, depending on how her tumor marker does  She knows to call for any problems that may develop before her next visit.     Chauncey Cruel, MD   07/26/2016 12:20 PM Medical Oncology and Hematology [facility] [address] [place], [place] [address] Tel. [phone]    Fax. [phone]

## 2016-07-30 ENCOUNTER — Telehealth: Payer: Self-pay | Admitting: Oncology

## 2016-07-30 NOTE — Telephone Encounter (Signed)
S/w pt, gave appt for 3/20 @ 11.45am.

## 2016-08-04 ENCOUNTER — Other Ambulatory Visit: Payer: Self-pay | Admitting: *Deleted

## 2016-08-04 ENCOUNTER — Ambulatory Visit (HOSPITAL_COMMUNITY)
Admission: RE | Admit: 2016-08-04 | Discharge: 2016-08-04 | Disposition: A | Discharge status: Home / Self Care | Payer: Medicare Other | Source: Ambulatory Visit | Attending: Oncology | Admitting: Oncology

## 2016-08-04 DIAGNOSIS — R05 Cough: Secondary | ICD-10-CM | POA: Insufficient documentation

## 2016-08-04 DIAGNOSIS — Z170 Estrogen receptor positive status [ER+]: Secondary | ICD-10-CM | POA: Diagnosis not present

## 2016-08-04 DIAGNOSIS — C50411 Malignant neoplasm of upper-outer quadrant of right female breast: Secondary | ICD-10-CM

## 2016-08-04 DIAGNOSIS — C7801 Secondary malignant neoplasm of right lung: Secondary | ICD-10-CM

## 2016-08-04 DIAGNOSIS — R059 Cough, unspecified: Secondary | ICD-10-CM

## 2016-08-04 DIAGNOSIS — R918 Other nonspecific abnormal finding of lung field: Secondary | ICD-10-CM | POA: Diagnosis not present

## 2016-08-04 MED ORDER — AZITHROMYCIN 250 MG PO TABS: ORAL_TABLET | ORAL | 0 refills | Status: DC

## 2016-08-04 MED ORDER — ALBUTEROL SULFATE HFA 108 (90 BASE) MCG/ACT IN AERS: 1.0000 | INHALATION_SPRAY | Freq: Four times a day (QID) | RESPIRATORY_TRACT | 2 refills | Status: DC | PRN

## 2016-08-04 NOTE — Telephone Encounter (Signed)
This RN spoke with pt per her call with ongoing cough and desire to be seen.  Noted no provider in [facility] presently.  Pt came in and obtained CXR - results reviewed with recommendations.  Discussed with pt and prescriptions sent to verified pharmacy.

## 2016-08-08 ENCOUNTER — Other Ambulatory Visit: Payer: Self-pay | Admitting: Oncology

## 2016-08-08 NOTE — Progress Notes (Unsigned)
I called [name] regarding her chest x-ray. We had started her on a Zithromax 08/04/2016. She is completing that, taking some antitussives, and already feels a lot better, although the cough still persists

## 2016-08-09 ENCOUNTER — Telehealth: Payer: Self-pay | Admitting: Pharmacist

## 2016-08-09 NOTE — Telephone Encounter (Signed)
Oral Chemotherapy Pharmacist Encounter  Received notification from Patient [facility]) that there has been no activity on patient's account in the past 60 days, and if there continues to be no activity prior to Oct 16, 2016, patient's funds will be rescinded. We had originally applied for PAF funds for patient on 03/22/16.  We had to apply for funds through Patient [facility]) back in Dec 2017 as funds from [facility] were frozen waiting for documentation from the office, and funds remained frozen despite office returning documentation.  I called [facility] to alert them that patient needs to use PAF funds with next Ibrance fill. They ran a test claim while I was on the phone and were able to use PAF funds.  [facility] will notate patient's chart to use PAF with next fill to avoid funds being frozen.  I called patient to inform her of above information and to remind pharmacy to use PAF funds with this next fill.  Patient expressed understanding and appreciation. All questions answered. Patient knows to call the office with questions or concerns.  Johny Drilling, PharmD, BCPS, BCOP 08/09/2016  4:09 PM Oral Oncology Clinic [phone]

## 2016-08-24 MED FILL — IBRANCE 100 MG CAPSULE: 100 | 28 days supply | Qty: 21 | Fill #1

## 2016-08-30 ENCOUNTER — Ambulatory Visit (HOSPITAL_BASED_OUTPATIENT_CLINIC_OR_DEPARTMENT_OTHER): Payer: Medicare Other | Admitting: Oncology

## 2016-08-30 ENCOUNTER — Other Ambulatory Visit (HOSPITAL_BASED_OUTPATIENT_CLINIC_OR_DEPARTMENT_OTHER): Payer: Medicare Other

## 2016-08-30 VITALS — BP 164/86 | HR 83 | Temp 98.6°F | Resp 18 | Ht 64.0 in | Wt 240.9 lb

## 2016-08-30 DIAGNOSIS — C50411 Malignant neoplasm of upper-outer quadrant of right female breast: Secondary | ICD-10-CM | POA: Diagnosis not present

## 2016-08-30 DIAGNOSIS — Z170 Estrogen receptor positive status [ER+]: Secondary | ICD-10-CM

## 2016-08-30 DIAGNOSIS — C7800 Secondary malignant neoplasm of unspecified lung: Secondary | ICD-10-CM | POA: Diagnosis not present

## 2016-08-30 DIAGNOSIS — C773 Secondary and unspecified malignant neoplasm of axilla and upper limb lymph nodes: Secondary | ICD-10-CM | POA: Diagnosis not present

## 2016-08-30 DIAGNOSIS — C7801 Secondary malignant neoplasm of right lung: Secondary | ICD-10-CM

## 2016-08-30 DIAGNOSIS — K760 Fatty (change of) liver, not elsewhere classified: Secondary | ICD-10-CM

## 2016-08-30 DIAGNOSIS — Z8542 Personal history of malignant neoplasm of other parts of uterus: Secondary | ICD-10-CM

## 2016-08-30 LAB — COMPREHENSIVE METABOLIC PANEL
ALT: 16 U/L (ref 0–55)
AST: 13 U/L (ref 5–34)
Albumin: 3.9 g/dL (ref 3.5–5.0)
Alkaline Phosphatase: 57 U/L (ref 40–150)
Anion Gap: 8 mEq/L (ref 3–11)
BUN: 15.5 mg/dL (ref 7.0–26.0)
CO2: 26 mEq/L (ref 22–29)
Calcium: 9.4 mg/dL (ref 8.4–10.4)
Chloride: 109 mEq/L (ref 98–109)
Creatinine: 1 mg/dL (ref 0.6–1.1)
EGFR: 57 mL/min/{1.73_m2} — ABNORMAL LOW (ref >90)
Glucose: 175 mg/dl — ABNORMAL HIGH (ref 70–140)
Potassium: 4.3 mEq/L (ref 3.5–5.1)
Sodium: 143 mEq/L (ref 136–145)
Total Bilirubin: 0.53 mg/dL (ref 0.20–1.20)
Total Protein: 6.7 g/dL (ref 6.4–8.3)

## 2016-08-30 LAB — CBC WITH DIFFERENTIAL/PLATELET
BASO%: 0.7 % (ref 0.0–2.0)
Basophils Absolute: 0 10*3/uL (ref 0.0–0.1)
EOS%: 0.4 % (ref 0.0–7.0)
Eosinophils Absolute: 0 10*3/uL (ref 0.0–0.5)
HCT: 33.8 % — ABNORMAL LOW (ref 34.8–46.6)
HGB: 11.7 g/dL (ref 11.6–15.9)
LYMPH%: 54.2 % — ABNORMAL HIGH (ref 14.0–49.7)
MCH: 32.5 pg (ref 25.1–34.0)
MCHC: 34.5 g/dL (ref 31.5–36.0)
MCV: 94.2 fL (ref 79.5–101.0)
MONO#: 0.4 10*3/uL (ref 0.1–0.9)
MONO%: 14.1 % — ABNORMAL HIGH (ref 0.0–14.0)
NEUT#: 0.8 10*3/uL — ABNORMAL LOW (ref 1.5–6.5)
NEUT%: 30.6 % — ABNORMAL LOW (ref 38.4–76.8)
Platelets: 157 10*3/uL (ref 145–400)
RBC: 3.59 10*6/uL — ABNORMAL LOW (ref 3.70–5.45)
RDW: 17.2 % — ABNORMAL HIGH (ref 11.2–14.5)
WBC: 2.7 10*3/uL — ABNORMAL LOW (ref 3.9–10.3)
lymph#: 1.5 10*3/uL (ref 0.9–3.3)

## 2016-08-30 NOTE — Progress Notes (Signed)
[place]  [phone] [phone]     ID: [name] DOB: Nov 10, 1945  MR#: [id number]  PNP#:[id number]  Patient Care Team: Biagio Borg, MD as PCP - General Alphonsa Overall, MD as Consulting Physician (General Surgery) Chauncey Cruel, MD as Consulting Physician (Oncology) Kyung Rudd, MD as Consulting Physician (Radiation Oncology) Bobbye Charleston, MD as Consulting Physician (Obstetrics and Gynecology) Nada Libman, MD as Referring Physician (Specialist) Vevelyn Royals, MD as Consulting Physician (Ophthalmology) Collene Gobble, MD as Consulting Physician (Pulmonary Disease) OTHER MD:  CHIEF COMPLAINT: Estrogen receptor positive breast cancer  CURRENT TREATMENT: Letrozole, palbociclib   BREAST CANCER HISTORY: From the original intake note:  [name] had screening mammography showing some suspicious calcifications in the right breast leading to right diagnostic mammography with ultrasonography 01/22/2016 at [facility]. The breast density was category C. In the upper right breast there was a 2.3 cm mass with additional masses measuring 0.9 and 0.7 cm. There was also a possible additional 0.8 mass in the lower inner quadrant. Ultrasound confirmed an irregular hypoechoic mass in the right breast upper outer quadrant measuring 2.0 cm. There were other masses measuring 0.7 and 0.8 cm by ultrasonography. The right axilla was sonographically benign.  Biopsy of a 12:00 and 4:00 mass in the right breast 01/22/2016 showed ([id number]) both specimens showing invasive ductal carcinoma, grade 1 or 2, both 95% estrogen receptor positive, both 95% progesterone receptor positive, both with strong staining intensity, with MIB-1 ranging from 10-15%, and both HER-2 negative, the signals ratio being 1.23-1.42, and the number per cell 1.85-2.59.  Her subsequent history is as detailed below  INTERVAL HISTORY: [name] returns today for follow-up of her stage IV estrogen  receptor positive breast cancer, accompanied by her husband and by their son body. The patient continues on letrozole, with very good tolerance. Hot flashes and vaginal dryness are not a major issue. She never developed the arthralgias or myalgias that many patients can experience on this medication. She obtains it at $3 a month.  She also does well on the palbociclib. The only side effect she reports is fatigue. It is not clear whether the palbociclib is entirely the cause of this since she has a long history of inactivity according to the son and husband today. She takes 2 or 3 during the day most days. According to the husband she snores very loudly. She doesn't sleep well at night. It may be on element of sleep apnea here and this is something to be addressed at some point in the future.  Also her INR today is only 0.8. We're going to have to postpone the start of her palbociclib cycle for 1 week. He will probably be wise to drop her dose another notch with future cycles but she already has the medication for this one so we are continuing on 100 mg daily once her [place] recovers to 1.5.   REVIEW OF SYSTEMS: [name] is not dissatisfied with her own condition in terms of her weight and activity level, but the family certainly is dissatisfied with it and this lead to many discussions today regarding optimal diet, optimal carbohydrate control, sugar issues, and exercise. Aside from that a detailed review of systems today was stable  PAST MEDICAL HISTORY: Past Medical History:  Diagnosis Date  . Cancer ([place]) 02/2016   right breast  . DIABETES MELLITUS, TYPE II 01/04/2007   only takes actoplus daily  . Dizziness and giddiness 02/29/2008  . DVT, HX OF  at age 89 in right buttocks  . Family history of breast cancer   . GERD 01/04/2007   pt reports resolved   . GLAUCOMA 07/30/2008   both eyes  . History of blood transfusion    no abnormal  reaction  . History of uterine cancer 2000   hysterectomy  done  . HYPERLIPIDEMIA 01/04/2007   taking Pravastatin daily  . HYPERTENSION 01/04/2007   takes Lisinopril daily  . Joint pain   . Joint swelling   . LEG PAIN, LEFT 07/06/2007  . NUMBNESS 07/30/2008   in fingers;pt states from Diamox  . OSTEOARTHRITIS, HIP 09/25/2009  . OTITIS MEDIA, ACUTE, BILATERAL 02/29/2008  . Overweight(278.02) 01/04/2007  . PONV (postoperative nausea and vomiting)   . SLEEP APNEA, OBSTRUCTIVE    doesn't use a cpap;study done about 4yr ago  . TRANSIENT ISCHEMIC ATTACK, HX OF 01/04/2007  . Vision loss    left eye    PAST SURGICAL HISTORY: Past Surgical History:  Procedure Laterality Date  . ABDOMINAL HYSTERECTOMY  2000  . CHOLECYSTECTOMY    . ENDOBRONCHIAL ULTRASOUND Bilateral 03/28/2016   Procedure: ENDOBRONCHIAL ULTRASOUND;  Surgeon: RCollene Gobble MD;  Location: [facility];  Service: Cardiopulmonary;  Laterality: Bilateral;  . EYE SURGERY  13   shunt left and lazer eye surgery on right cataract and retenia tear with repair  . growth removal  2004   from thumb  . KNEE ARTHROSCOPY Right   . mulitple eye surgeries     both eyes, cataracts with ioc done both eyes  . OOPHORECTOMY    . TOTAL HIP ARTHROPLASTY  06/24/2011   Procedure: TOTAL HIP ARTHROPLASTY;  Surgeon: FKerin Salen  [facility]  Service: Orthopedics;  Laterality: Right;  . TOTAL HIP ARTHROPLASTY Left 11/12/2012   Dr RMayer Camel . TOTAL HIP ARTHROPLASTY Left 11/12/2012   Procedure: TOTAL HIP ARTHROPLASTY;  Surgeon: FKerin Salen MD;  [place] [facility]  Service: Orthopedics;  Laterality: Left;  DEPUY PINNACLE    FAMILY HISTORY Family History  Problem Relation Age of Onset  . Dementia Mother   . Cancer Mother     Breast and lung cancer  . Stroke Sister   . Breast cancer Sister 522 . Heart attack Maternal Aunt   . Lung cancer Maternal Grandmother     non smoker  . Glaucoma Maternal Grandfather   . Anesthesia problems Neg Hx   The patient's father died at age 633 the patient's mother  died at age 71 She had breast and lung cancers diagnosed shortly before her death. The patient had no brothers, 2 sisters. One sister was diagnosed with breast cancer at the age of 545  GYNECOLOGIC HISTORY:  No LMP recorded. Patient has had a hysterectomy. Menarche age 71 first live birth age 71 the patient is GX P1. She had a hysterectomy for endometrial cancer in the year 2000. She did not take hormone replacement. She did use oral contraceptives for your than 20 years remotely, with no complications.  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 She is home with her husband [name] He is a retired mDealerTheir son CJuanda [place]rumblealso lives in [place]    ADVANCED DIRECTIVES: In place  HEALTH MAINTENANCE: Social History  Substance Use Topics  . Smoking status: Never Smoker  . Smokeless tobacco: Never Used  . Alcohol use No     Colonoscopy: Never  PAP: Status  post hysterectomy  Bone density: Remote   Allergies  Allergen Reactions  . Codeine Hives    Hycodan syrup  . Fluorescein Nausea And Vomiting    ? IV dye for retina specialist  . Lipitor [Atorvastatin Calcium]     Leg cramp  . Oxycodone Nausea And Vomiting    Patient vomited for 3 days after taking  . Sitagliptin Phosphate Nausea And Vomiting  . Sulfa Drugs Cross Reactors Nausea And Vomiting    Current Outpatient Prescriptions  Medication Sig Dispense Refill  . acetaminophen (TYLENOL) 500 MG tablet Take 1,000 mg by mouth every 4 (four) hours as needed for moderate pain or fever.    Marland Kitchen albuterol (PROVENTIL HFA;VENTOLIN HFA) 108 (90 Base) MCG/ACT inhaler Inhale 1-2 puffs into the lungs every 6 (six) hours as needed for wheezing or shortness of breath. 1 Inhaler 2  . aspirin 81 MG tablet Take 81 mg by mouth daily. Reported on 06/12/2015    . azithromycin (ZITHROMAX) 250 MG tablet Take as directed 6 each 0  . bimatoprost (LUMIGAN) 0.01 % SOLN  1 drop into each eye once nightly.    . brimonidine-timolol (COMBIGAN) 0.2-0.5 % ophthalmic solution Place 1 drop into both eyes three times daily    . guaifenesin (ROBITUSSIN) 100 MG/5ML syrup Take 200 mg by mouth every 4 (four) hours as needed for cough or congestion.    Marland Kitchen letrozole (FEMARA) 2.5 MG tablet Take 1 tablet (2.5 mg total) by mouth at bedtime.    . lovastatin (MEVACOR) 20 MG tablet Take 1 tablet (20 mg total) by mouth every evening. 90 tablet 3  . oseltamivir (TAMIFLU) 75 MG capsule Take 1 capsule (75 mg total) by mouth 2 (two) times daily. 7 capsule 0  . palbociclib (IBRANCE) 100 MG capsule Take 1 capsule (100 mg total) by mouth daily with breakfast. Take whole with food. 21 capsule 6  . pioglitazone-metformin (ACTOPLUS MET) 15-500 MG tablet Take 15-500 tablets by mouth daily. (Patient taking differently: Take 1 tablet by mouth daily. ) 90 tablet 3  . predniSONE (DELTASONE) 20 MG tablet Take 2 tablets (40 mg total) by mouth daily with breakfast. 6 tablet 0  . prochlorperazine (COMPAZINE) 10 MG tablet Take 1 tablet (10 mg total) by mouth every 6 (six) hours as needed for nausea or vomiting. 30 tablet 0  . Vitamin D, Cholecalciferol, 1000 units TABS Take 1,000 Units by mouth every evening.      No current facility-administered medications for this visit.     OBJECTIVE: Middle-aged white woman who appears stated age  4:   08/30/16 1248  BP: (!) 164/86  Pulse: 83  Resp: 18  Temp: 98.6 F (37 C)     Body mass index is 41.35 kg/m.    ECOG FS:1 - Symptomatic but completely ambulatory  Sclerae unicteric, EOMs intact Oropharynx clear and moist No cervical or supraclavicular adenopathy Lungs no rales or rhonchi Heart regular rate and rhythm Abd soft, obese, nontender, positive bowel sounds MSK no focal spinal tenderness, no upper extremity lymphedema Neuro: nonfocal, well oriented, appropriate affect Breasts: Deferred   LAB RESULTS:  CMP     Component Value  Date/Time   NA 143 08/30/2016 1130   K 4.3 08/30/2016 1130   CL 107 07/23/2016 0550   CO2 26 08/30/2016 1130   GLUCOSE 175 (H) 08/30/2016 1130   BUN 15.5 08/30/2016 1130   CREATININE 1.0 08/30/2016 1130   CALCIUM 9.4 08/30/2016 1130   PROT 6.7 08/30/2016 1130   ALBUMIN  3.9 08/30/2016 1130   AST 13 08/30/2016 1130   ALT 16 08/30/2016 1130   ALKPHOS 57 08/30/2016 1130   BILITOT 0.53 08/30/2016 1130   GFRNONAA >60 07/23/2016 0550   GFRAA >60 07/23/2016 0550    INo results found for: SPEP, UPEP  Lab Results  Component Value Date   WBC 2.7 (L) 08/30/2016   NEUTROABS 0.8 (L) 08/30/2016   HGB 11.7 08/30/2016   HCT 33.8 (L) 08/30/2016   MCV 94.2 08/30/2016   PLT 157 08/30/2016      Chemistry      Component Value Date/Time   NA 143 08/30/2016 1130   K 4.3 08/30/2016 1130   CL 107 07/23/2016 0550   CO2 26 08/30/2016 1130   BUN 15.5 08/30/2016 1130   CREATININE 1.0 08/30/2016 1130      Component Value Date/Time   CALCIUM 9.4 08/30/2016 1130   ALKPHOS 57 08/30/2016 1130   AST 13 08/30/2016 1130   ALT 16 08/30/2016 1130   BILITOT 0.53 08/30/2016 1130       No results found for: LABCA2  No components found for: LABCA125  No results for input(s): INR in the last 168 hours.  Urinalysis    Component Value Date/Time   COLORURINE YELLOW 07/22/2016 1132   APPEARANCEUR HAZY (A) 07/22/2016 1132   LABSPEC 1.013 07/22/2016 1132   PHURINE 5.0 07/22/2016 1132   GLUCOSEU NEGATIVE 07/22/2016 1132   GLUCOSEU NEGATIVE 10/30/2014 1513   HGBUR MODERATE (A) 07/22/2016 1132   BILIRUBINUR NEGATIVE 07/22/2016 1132   KETONESUR 5 (A) 07/22/2016 1132   PROTEINUR NEGATIVE 07/22/2016 1132   UROBILINOGEN 0.2 10/30/2014 1513   NITRITE NEGATIVE 07/22/2016 1132   LEUKOCYTESUR LARGE (A) 07/22/2016 1132     STUDIES: Dg Chest 2 View  Result Date: 08/04/2016 CLINICAL DATA:  Metastatic right breast cancer. Cough. Recent influenza. EXAM: CHEST  2 VIEW COMPARISON:  07/22/2016.  CT  06/22/2016. FINDINGS: Heart size is normal. There is aortic atherosclerosis. The left chest is clear. Chronic perihilar scarring on the right is again seen. I think there may be slightly more density in the right middle lobe, suggesting mild pneumonia. Discrete mass lesion is not demonstrated. No effusion. IMPRESSION: Chronic markings in the right perihilar region. Extension weighted density in the right middle lobe on the lateral view, suggesting mild right middle lobe pneumonia. Electronically Signed   By: Nelson Chimes M.D.   On: 08/04/2016 12:51     ELIGIBLE FOR AVAILABLE RESEARCH PROTOCOL: no  ASSESSMENT: 71 y.o. [place] woman with a remote history of early stage endometrial cancer, now status post right breast upper outer quadrant biopsy 01/22/2016 for a clinically multifocal T2 N0, stage 2A invasive ductal carcinoma, grade 1, estrogen and progesterone receptor positive, HER-2 negative, with an MIB-1 between 10 and 15%.  (1) right axillary lymph node biopsy 03/02/2016 positive  (2) genetics testing 01/13/2016 through the Custom gene panel offered by [facility] found no deleterious mutations in  ATM, BARD1, BRCA1, BRCA2, BRIP1, CDH1, CHEK2, EPCAM, FANCC, MLH1, MSH2, MSH6, MUTYH, NBN, PALB2, PMS2, POLD1, PTEN, RAD51C, RAD51D, TP53, and XRCC2  METASTATIC DISEASE: OCT 2017 (3) CT scans of the chest abdomen and pelvis obtained 03/10/2016 are consistent with bilateral lung metastases and mediastinal and hilar nodal involvement, but no liver or bone spread  (a) bronchoscopic lymph node biopsy 2 (station 7, 13R) 03/28/2016 confirms metastatic adenocarcinoma, estrogen receptor positive, HER-2 not amplified  (b) baseline CA-27-29 on 04/18/2016 was 137.5.  (4) letrozole started 03/15/2016, palbociclib added 03/29/2016  at 125 mg/day, 21/7  (a) dose decreased to 100 mg per day, 21/7, beginning with February cycle  PLAN: I spent approximately 30 minutes with [name] with most of that time spent  discussing her situation with her family. She had told me her son did not believe that she had cancer, but what I heard today is that he is very concerned she is not exercising and not having the right diet. We discussed this at length. I have encouraged her to do something 20 minutes twice daily. We discussed cutting back on Karp's. If she does follow this on a regular basis over the next several month we will expect her to have more energy, be less sleepy, and generally feel better.  She tells me she had a sleep study many years ago but she does not know what it showed. She does nor very loudly according to her husband and she sleeps during the day quite a bit. She sleeps very fitfully at night. I think a sleep study might be something to consider  For now however the important point is that she is tolerating her letrozole well and also doing well on the palbociclib. We are having to delay her palpable one week because of low counts so after the current cycle she will drop the dose to 75 mg.  She is going to see me again in May and we will obtain restaging studies before that. If they again shows significant response I think it may be time to move her to consideration of likely mastectomy to prevent chest wall complications in the future  She knows to call for any problems that may develop before her next visit here.   Chauncey Cruel, MD   08/30/2016 12:49 PM Medical Oncology and Hematology [facility] [address] [place], [place] [address] Tel. [phone]    Fax. [phone]

## 2016-08-31 LAB — CANCER ANTIGEN 27.29: CA 27.29: 48.2 U/mL — ABNORMAL HIGH (ref 0.0–38.6)

## 2016-09-03 ENCOUNTER — Encounter (HOSPITAL_COMMUNITY): Payer: Self-pay

## 2016-09-05 DIAGNOSIS — Z961 Presence of intraocular lens: Secondary | ICD-10-CM | POA: Diagnosis not present

## 2016-09-05 DIAGNOSIS — H209 Unspecified iridocyclitis: Secondary | ICD-10-CM | POA: Diagnosis not present

## 2016-09-05 DIAGNOSIS — H401134 Primary open-angle glaucoma, bilateral, indeterminate stage: Secondary | ICD-10-CM | POA: Diagnosis not present

## 2016-09-05 DIAGNOSIS — H353132 Nonexudative age-related macular degeneration, bilateral, intermediate dry stage: Secondary | ICD-10-CM | POA: Diagnosis not present

## 2016-09-06 ENCOUNTER — Other Ambulatory Visit (HOSPITAL_BASED_OUTPATIENT_CLINIC_OR_DEPARTMENT_OTHER): Payer: Medicare Other

## 2016-09-06 DIAGNOSIS — C50411 Malignant neoplasm of upper-outer quadrant of right female breast: Secondary | ICD-10-CM

## 2016-09-06 DIAGNOSIS — C7801 Secondary malignant neoplasm of right lung: Secondary | ICD-10-CM

## 2016-09-06 DIAGNOSIS — Z170 Estrogen receptor positive status [ER+]: Secondary | ICD-10-CM

## 2016-09-06 DIAGNOSIS — Z8542 Personal history of malignant neoplasm of other parts of uterus: Secondary | ICD-10-CM

## 2016-09-06 LAB — CBC WITH DIFFERENTIAL/PLATELET
BASO%: 0.8 % (ref 0.0–2.0)
Basophils Absolute: 0 10*3/uL (ref 0.0–0.1)
EOS%: 0.6 % (ref 0.0–7.0)
Eosinophils Absolute: 0 10*3/uL (ref 0.0–0.5)
HCT: 34.7 % — ABNORMAL LOW (ref 34.8–46.6)
HGB: 11.7 g/dL (ref 11.6–15.9)
LYMPH%: 39.2 % (ref 14.0–49.7)
MCH: 31.8 pg (ref 25.1–34.0)
MCHC: 33.7 g/dL (ref 31.5–36.0)
MCV: 94.3 fL (ref 79.5–101.0)
MONO#: 0.5 10*3/uL (ref 0.1–0.9)
MONO%: 11.3 % (ref 0.0–14.0)
NEUT#: 2.2 10*3/uL (ref 1.5–6.5)
NEUT%: 48.1 % (ref 38.4–76.8)
Platelets: 215 10*3/uL (ref 145–400)
RBC: 3.68 10*6/uL — ABNORMAL LOW (ref 3.70–5.45)
RDW: 16.7 % — ABNORMAL HIGH (ref 11.2–14.5)
WBC: 4.5 10*3/uL (ref 3.9–10.3)
lymph#: 1.8 10*3/uL (ref 0.9–3.3)

## 2016-09-15 ENCOUNTER — Other Ambulatory Visit: Payer: Self-pay | Admitting: Oncology

## 2016-09-15 ENCOUNTER — Telehealth: Payer: Self-pay | Admitting: *Deleted

## 2016-09-15 ENCOUNTER — Other Ambulatory Visit: Payer: Self-pay | Admitting: *Deleted

## 2016-09-15 DIAGNOSIS — C50411 Malignant neoplasm of upper-outer quadrant of right female breast: Secondary | ICD-10-CM

## 2016-09-15 DIAGNOSIS — Z8542 Personal history of malignant neoplasm of other parts of uterus: Secondary | ICD-10-CM

## 2016-09-15 DIAGNOSIS — C7800 Secondary malignant neoplasm of unspecified lung: Secondary | ICD-10-CM

## 2016-09-15 DIAGNOSIS — Z170 Estrogen receptor positive status [ER+]: Secondary | ICD-10-CM

## 2016-09-19 ENCOUNTER — Telehealth: Payer: Self-pay | Admitting: *Deleted

## 2016-09-19 DIAGNOSIS — H2013 Chronic iridocyclitis, bilateral: Secondary | ICD-10-CM | POA: Diagnosis not present

## 2016-09-19 DIAGNOSIS — H35351 Cystoid macular degeneration, right eye: Secondary | ICD-10-CM | POA: Diagnosis not present

## 2016-09-19 NOTE — Telephone Encounter (Signed)
FYI "I am calling to let Dr. Virgie Dad nurse know I have not heard from scheduling for the CT scan.  She does not need to call me back she had asked that I let her know."  Provided Radiology Central Scheduling phone number at this time.  CT chest w/contrast ordered 08-30-2016 reads authorized.

## 2016-09-19 NOTE — Telephone Encounter (Signed)
CT is now currently scheduled on 10-05-2016.

## 2016-10-03 ENCOUNTER — Other Ambulatory Visit: Payer: Self-pay

## 2016-10-04 ENCOUNTER — Other Ambulatory Visit: Payer: Self-pay | Admitting: Oncology

## 2016-10-04 ENCOUNTER — Ambulatory Visit
Admission: RE | Admit: 2016-10-04 | Discharge: 2016-10-04 | Disposition: A | Discharge status: Home / Self Care | Payer: Medicare Other | Source: Ambulatory Visit | Attending: Oncology | Admitting: Oncology

## 2016-10-04 ENCOUNTER — Other Ambulatory Visit: Payer: Self-pay

## 2016-10-04 DIAGNOSIS — R928 Other abnormal and inconclusive findings on diagnostic imaging of breast: Secondary | ICD-10-CM | POA: Diagnosis not present

## 2016-10-04 DIAGNOSIS — C7800 Secondary malignant neoplasm of unspecified lung: Secondary | ICD-10-CM

## 2016-10-04 DIAGNOSIS — C50411 Malignant neoplasm of upper-outer quadrant of right female breast: Secondary | ICD-10-CM

## 2016-10-04 DIAGNOSIS — Z8542 Personal history of malignant neoplasm of other parts of uterus: Secondary | ICD-10-CM

## 2016-10-04 DIAGNOSIS — N6489 Other specified disorders of breast: Secondary | ICD-10-CM | POA: Diagnosis not present

## 2016-10-04 DIAGNOSIS — Z170 Estrogen receptor positive status [ER+]: Secondary | ICD-10-CM

## 2016-10-04 HISTORY — DX: Malignant neoplasm of unspecified site of unspecified female breast: C50.919

## 2016-10-05 ENCOUNTER — Ambulatory Visit (HOSPITAL_COMMUNITY)
Admission: RE | Admit: 2016-10-05 | Discharge: 2016-10-05 | Disposition: A | Discharge status: Home / Self Care | Payer: Medicare Other | Source: Ambulatory Visit | Attending: Oncology | Admitting: Oncology

## 2016-10-05 ENCOUNTER — Telehealth: Payer: Self-pay

## 2016-10-05 ENCOUNTER — Encounter (HOSPITAL_COMMUNITY): Payer: Self-pay

## 2016-10-05 DIAGNOSIS — I2510 Atherosclerotic heart disease of native coronary artery without angina pectoris: Secondary | ICD-10-CM | POA: Diagnosis not present

## 2016-10-05 DIAGNOSIS — N289 Disorder of kidney and ureter, unspecified: Secondary | ICD-10-CM | POA: Insufficient documentation

## 2016-10-05 DIAGNOSIS — Z8542 Personal history of malignant neoplasm of other parts of uterus: Secondary | ICD-10-CM | POA: Insufficient documentation

## 2016-10-05 DIAGNOSIS — Z9049 Acquired absence of other specified parts of digestive tract: Secondary | ICD-10-CM | POA: Insufficient documentation

## 2016-10-05 DIAGNOSIS — C50911 Malignant neoplasm of unspecified site of right female breast: Secondary | ICD-10-CM | POA: Diagnosis not present

## 2016-10-05 DIAGNOSIS — C50411 Malignant neoplasm of upper-outer quadrant of right female breast: Secondary | ICD-10-CM | POA: Diagnosis not present

## 2016-10-05 DIAGNOSIS — Z170 Estrogen receptor positive status [ER+]: Secondary | ICD-10-CM | POA: Insufficient documentation

## 2016-10-05 DIAGNOSIS — C7800 Secondary malignant neoplasm of unspecified lung: Secondary | ICD-10-CM

## 2016-10-05 DIAGNOSIS — R918 Other nonspecific abnormal finding of lung field: Secondary | ICD-10-CM | POA: Insufficient documentation

## 2016-10-05 DIAGNOSIS — K838 Other specified diseases of biliary tract: Secondary | ICD-10-CM | POA: Diagnosis not present

## 2016-10-05 DIAGNOSIS — R911 Solitary pulmonary nodule: Secondary | ICD-10-CM | POA: Diagnosis not present

## 2016-10-05 DIAGNOSIS — I700 Atherosclerosis of aorta: Secondary | ICD-10-CM | POA: Diagnosis not present

## 2016-10-05 MED ORDER — IOPAMIDOL (ISOVUE-300) INJECTION 61%
INTRAVENOUS | Status: AC
  Administered 2016-10-05: 75 mL
  Filled 2016-10-05: qty 75

## 2016-10-05 NOTE — Telephone Encounter (Signed)
LVM for pt to return my call so we can go over her [place] results.

## 2016-10-06 ENCOUNTER — Telehealth: Payer: Self-pay

## 2016-10-06 NOTE — Telephone Encounter (Signed)
Reviewed results of breast US over phone with pt.

## 2016-10-07 ENCOUNTER — Other Ambulatory Visit: Payer: Self-pay | Admitting: Oncology

## 2016-10-11 ENCOUNTER — Other Ambulatory Visit (HOSPITAL_BASED_OUTPATIENT_CLINIC_OR_DEPARTMENT_OTHER): Payer: Medicare Other

## 2016-10-11 ENCOUNTER — Ambulatory Visit (HOSPITAL_BASED_OUTPATIENT_CLINIC_OR_DEPARTMENT_OTHER): Payer: Medicare Other | Admitting: Oncology

## 2016-10-11 VITALS — BP 196/80 | HR 66 | Temp 97.6°F | Resp 18 | Wt 237.0 lb

## 2016-10-11 DIAGNOSIS — Z170 Estrogen receptor positive status [ER+]: Secondary | ICD-10-CM | POA: Diagnosis not present

## 2016-10-11 DIAGNOSIS — C50411 Malignant neoplasm of upper-outer quadrant of right female breast: Secondary | ICD-10-CM

## 2016-10-11 DIAGNOSIS — C7801 Secondary malignant neoplasm of right lung: Secondary | ICD-10-CM | POA: Diagnosis not present

## 2016-10-11 DIAGNOSIS — K760 Fatty (change of) liver, not elsewhere classified: Secondary | ICD-10-CM

## 2016-10-11 DIAGNOSIS — Z8542 Personal history of malignant neoplasm of other parts of uterus: Secondary | ICD-10-CM

## 2016-10-11 DIAGNOSIS — C773 Secondary and unspecified malignant neoplasm of axilla and upper limb lymph nodes: Secondary | ICD-10-CM | POA: Diagnosis not present

## 2016-10-11 LAB — COMPREHENSIVE METABOLIC PANEL
ALT: 26 U/L (ref 0–55)
AST: 20 U/L (ref 5–34)
Albumin: 4.2 g/dL (ref 3.5–5.0)
Alkaline Phosphatase: 56 U/L (ref 40–150)
Anion Gap: 7 mEq/L (ref 3–11)
BUN: 10.8 mg/dL (ref 7.0–26.0)
CO2: 28 mEq/L (ref 22–29)
Calcium: 9.8 mg/dL (ref 8.4–10.4)
Chloride: 108 mEq/L (ref 98–109)
Creatinine: 0.8 mg/dL (ref 0.6–1.1)
EGFR: 74 mL/min/{1.73_m2} — ABNORMAL LOW (ref >90)
Glucose: 103 mg/dl (ref 70–140)
Potassium: 4.2 mEq/L (ref 3.5–5.1)
Sodium: 143 mEq/L (ref 136–145)
Total Bilirubin: 0.52 mg/dL (ref 0.20–1.20)
Total Protein: 7.2 g/dL (ref 6.4–8.3)

## 2016-10-11 LAB — CBC WITH DIFFERENTIAL/PLATELET
BASO%: 0.2 % (ref 0.0–2.0)
Basophils Absolute: 0 10*3/uL (ref 0.0–0.1)
EOS%: 1 % (ref 0.0–7.0)
Eosinophils Absolute: 0 10*3/uL (ref 0.0–0.5)
HCT: 35.2 % (ref 34.8–46.6)
HGB: 12 g/dL (ref 11.6–15.9)
LYMPH%: 40.9 % (ref 14.0–49.7)
MCH: 31.3 pg (ref 25.1–34.0)
MCHC: 34.1 g/dL (ref 31.5–36.0)
MCV: 91.9 fL (ref 79.5–101.0)
MONO#: 0.5 10*3/uL (ref 0.1–0.9)
MONO%: 11.4 % (ref 0.0–14.0)
NEUT#: 1.9 10*3/uL (ref 1.5–6.5)
NEUT%: 46.5 % (ref 38.4–76.8)
Platelets: 192 10*3/uL (ref 145–400)
RBC: 3.83 10*6/uL (ref 3.70–5.45)
RDW: 16.3 % — ABNORMAL HIGH (ref 11.2–14.5)
WBC: 4 10*3/uL (ref 3.9–10.3)
lymph#: 1.7 10*3/uL (ref 0.9–3.3)

## 2016-10-11 MED FILL — IBRANCE 100 MG CAPSULE: 100 | 28 days supply | Qty: 21 | Fill #2

## 2016-10-11 NOTE — Progress Notes (Signed)
[place]  [phone]) [phone] [phone]     ID: [name] DOB: 06/24/1945  MR#: [id number]  KGY#:[id number]  Patient Care Team: Biagio Borg, MD as PCP - General Alphonsa Overall, MD as Consulting Physician (General Surgery) Chauncey Cruel, MD as Consulting Physician (Oncology) Kyung Rudd, MD as Consulting Physician (Radiation Oncology) Bobbye Charleston, MD as Consulting Physician (Obstetrics and Gynecology) Nada Libman, MD as Referring Physician (Specialist) Vevelyn Royals, MD as Consulting Physician (Ophthalmology) Collene Gobble, MD as Consulting Physician (Pulmonary Disease) OTHER MD:  CHIEF COMPLAINT: Estrogen receptor positive breast cancer  CURRENT TREATMENT: Letrozole, palbociclib   BREAST CANCER HISTORY: From the original intake note:  [name] had screening mammography showing some suspicious calcifications in the right breast leading to right diagnostic mammography with ultrasonography 01/22/2016 at [facility]. The breast density was category C. In the upper right breast there was a 2.3 cm mass with additional masses measuring 0.9 and 0.7 cm. There was also a possible additional 0.8 mass in the lower inner quadrant. Ultrasound confirmed an irregular hypoechoic mass in the right breast upper outer quadrant measuring 2.0 cm. There were other masses measuring 0.7 and 0.8 cm by ultrasonography. The right axilla was sonographically benign.  Biopsy of a 12:00 and 4:00 mass in the right breast 01/22/2016 showed ([id number]) both specimens showing invasive ductal carcinoma, grade 1 or 2, both 95% estrogen receptor positive, both 95% progesterone receptor positive, both with strong staining intensity, with MIB-1 ranging from 10-15%, and both HER-2 negative, the signals ratio being 1.23-1.42, and the number per cell 1.85-2.59.  Her subsequent history is as detailed below  INTERVAL HISTORY: Otis returns today for follow-up of her estrogen receptor  positive metastatic breast cancer, accompanied by her husband and daughter-in-law. [name] continues on letrozole, with good tolerance. She is also on palbociclib, but has not had this for approximately 3 weeks because she says she was told not to take it until she saw me today.  In the interim she has been staged with a CT scan of the chest and ultrasound of the right breast. The CT scan of the chest is essentially stable, showing no new lesions and no growth of the previously noted lesions. Ultrasound of the right breast shows clear improvement in the measurable disease. There is no evidence of liver or bone involvement.   REVIEW OF SYSTEMS: [name] was very alarmed by some statements in the CT scan report, including an heading that says that she has cancer of the uterus. She had many questions regarding medical terminology used in the report. Another concern is the fact that the family feels she is becoming more and more forgetful. The husband tells me she put on her bridges backwards today. They have been some conversations that have had to be repeated several times. They are worried that this might be more than just anxiety. The patient also has had pain in both breasts. This is fleeting, lasts 1 or 2 hours, and then resolves. It tends to occur at night. Aside from these issues a detailed review of systems today was stable  PAST MEDICAL HISTORY: Past Medical History:  Diagnosis Date  . Breast cancer ([place])   . Cancer ([place]) 02/2016   right breast  . DIABETES MELLITUS, TYPE II 01/04/2007   only takes actoplus daily  . Dizziness and giddiness 02/29/2008  . DVT, HX OF    at age 10 in right buttocks  . Family history of breast cancer   .  GERD 01/04/2007   pt reports resolved   . GLAUCOMA 07/30/2008   both eyes  . History of blood transfusion    no abnormal  reaction  . History of uterine cancer 2000   hysterectomy done  . HYPERLIPIDEMIA 01/04/2007   taking Pravastatin daily  . HYPERTENSION  01/04/2007   takes Lisinopril daily  . Joint pain   . Joint swelling   . LEG PAIN, LEFT 07/06/2007  . NUMBNESS 07/30/2008   in fingers;pt states from Diamox  . OSTEOARTHRITIS, HIP 09/25/2009  . OTITIS MEDIA, ACUTE, BILATERAL 02/29/2008  . Overweight(278.02) 01/04/2007  . PONV (postoperative nausea and vomiting)   . SLEEP APNEA, OBSTRUCTIVE    doesn't use a cpap;study done about 7yr ago  . TRANSIENT ISCHEMIC ATTACK, HX OF 01/04/2007  . Vision loss    left eye    PAST SURGICAL HISTORY: Past Surgical History:  Procedure Laterality Date  . ABDOMINAL HYSTERECTOMY  2000  . CHOLECYSTECTOMY    . ENDOBRONCHIAL ULTRASOUND Bilateral 03/28/2016   Procedure: ENDOBRONCHIAL ULTRASOUND;  Surgeon: RCollene Gobble MD;  Location: [facility];  Service: Cardiopulmonary;  Laterality: Bilateral;  . EYE SURGERY  13   shunt left and lazer eye surgery on right cataract and retenia tear with repair  . growth removal  2004   from thumb  . KNEE ARTHROSCOPY Right   . mulitple eye surgeries     both eyes, cataracts with ioc done both eyes  . OOPHORECTOMY    . TOTAL HIP ARTHROPLASTY  06/24/2011   Procedure: TOTAL HIP ARTHROPLASTY;  Surgeon: FKerin Salen  [facility]  Service: Orthopedics;  Laterality: Right;  . TOTAL HIP ARTHROPLASTY Left 11/12/2012   Dr RMayer Camel . TOTAL HIP ARTHROPLASTY Left 11/12/2012   Procedure: TOTAL HIP ARTHROPLASTY;  Surgeon: FKerin Salen MD;  [facility]  Service: Orthopedics;  Laterality: Left;  DEPUY PINNACLE    FAMILY HISTORY Family History  Problem Relation Age of Onset  . Dementia Mother   . Cancer Mother     Breast and lung cancer  . Stroke Sister   . Breast cancer Sister 587 . Heart attack Maternal Aunt   . Lung cancer Maternal Grandmother     non smoker  . Glaucoma Maternal Grandfather   . Anesthesia problems Neg Hx   The patient's father died at age 69028 the patient's mother died at age 71 She had breast and lung cancers diagnosed shortly before her  death. The patient had no brothers, 2 sisters. One sister was diagnosed with breast cancer at the age of 593  GYNECOLOGIC HISTORY:  No LMP recorded. Patient has had a hysterectomy. Menarche age 89 first live birth age 71 the patient is GX P1. She had a hysterectomy for endometrial cancer in the year 2000. She did not take hormone replacement. She did use oral contraceptives for your than 20 years remotely, with no complications.  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 She is home with her husband [name] He is a retired mDealerTheir son CJuanda [place]also [place] in [place]    ADVANCED DIRECTIVES: In place  HEALTH MAINTENANCE: Social History  Substance Use Topics  . Smoking status: Never Smoker  . Smokeless tobacco: Never Used  . Alcohol use No     Colonoscopy: Never  PAP: Status post hysterectomy  Bone density: Remote   Allergies  Allergen Reactions  . Codeine Hives  Hycodan syrup  . Fluorescein Nausea And Vomiting    ? IV dye for retina specialist  . Lipitor [Atorvastatin Calcium]     Leg cramp  . Oxycodone Nausea And Vomiting    Patient vomited for 3 days after taking  . Sitagliptin Phosphate Nausea And Vomiting  . Sulfa Drugs Cross Reactors Nausea And Vomiting    Current Outpatient Prescriptions  Medication Sig Dispense Refill  . acetaminophen (TYLENOL) 500 MG tablet Take 1,000 mg by mouth every 4 (four) hours as needed for moderate pain or fever.    Marland Kitchen albuterol (PROVENTIL HFA;VENTOLIN HFA) 108 (90 Base) MCG/ACT inhaler Inhale 1-2 puffs into the lungs every 6 (six) hours as needed for wheezing or shortness of breath. 1 Inhaler 2  . aspirin 81 MG tablet Take 81 mg by mouth daily. Reported on 06/12/2015    . azithromycin (ZITHROMAX) 250 MG tablet Take as directed 6 each 0  . bimatoprost (LUMIGAN) 0.01 % SOLN 1 drop into each eye once nightly.    . brimonidine-timolol (COMBIGAN)  0.2-0.5 % ophthalmic solution Place 1 drop into both eyes three times daily    . guaifenesin (ROBITUSSIN) 100 MG/5ML syrup Take 200 mg by mouth every 4 (four) hours as needed for cough or congestion.    Marland Kitchen letrozole (FEMARA) 2.5 MG tablet Take 1 tablet (2.5 mg total) by mouth at bedtime.    . lovastatin (MEVACOR) 20 MG tablet Take 1 tablet (20 mg total) by mouth every evening. 90 tablet 3  . oseltamivir (TAMIFLU) 75 MG capsule Take 1 capsule (75 mg total) by mouth 2 (two) times daily. 7 capsule 0  . palbociclib (IBRANCE) 100 MG capsule Take 1 capsule (100 mg total) by mouth daily with breakfast. Take whole with food. 21 capsule 6  . pioglitazone-metformin (ACTOPLUS MET) 15-500 MG tablet Take 15-500 tablets by mouth daily. (Patient taking differently: Take 1 tablet by mouth daily. ) 90 tablet 3  . predniSONE (DELTASONE) 20 MG tablet Take 2 tablets (40 mg total) by mouth daily with breakfast. 6 tablet 0  . prochlorperazine (COMPAZINE) 10 MG tablet Take 1 tablet (10 mg total) by mouth every 6 (six) hours as needed for nausea or vomiting. 30 tablet 0  . Vitamin D, Cholecalciferol, 1000 units TABS Take 1,000 Units by mouth every evening.      No current facility-administered medications for this visit.     OBJECTIVE: Middle-aged white woman In no acute distress  Vitals:   10/11/16 1550  BP: (!) 196/80  Pulse: 66  Resp: 18  Temp: 97.6 F (36.4 C)     Body mass index is 40.68 kg/m.    ECOG FS:1 - Symptomatic but completely ambulatory Filed Weights   10/11/16 1550  Weight: 237 lb (107.5 kg)    Sclerae unicteric, pupils round and equal Oropharynx clear and moist No cervical or supraclavicular adenopathy Lungs no rales or rhonchi Heart regular rate and rhythm Abd soft, obese, nontender, positive bowel sounds MSK no focal spinal tenderness, no upper extremity lymphedema Neuro: nonfocal, well oriented, appropriate affect Breasts: Deferred. Both axillae are benign   LAB RESULTS:  CMP      Component Value Date/Time   NA 143 08/30/2016 1130   K 4.3 08/30/2016 1130   CL 107 07/23/2016 0550   CO2 26 08/30/2016 1130   GLUCOSE 175 (H) 08/30/2016 1130   BUN 15.5 08/30/2016 1130   CREATININE 1.0 08/30/2016 1130   CALCIUM 9.4 08/30/2016 1130   PROT 6.7 08/30/2016 1130  ALBUMIN 3.9 08/30/2016 1130   AST 13 08/30/2016 1130   ALT 16 08/30/2016 1130   ALKPHOS 57 08/30/2016 1130   BILITOT 0.53 08/30/2016 1130   GFRNONAA >60 07/23/2016 0550   GFRAA >60 07/23/2016 0550    INo results found for: SPEP, UPEP  Lab Results  Component Value Date   WBC 4.0 10/11/2016   NEUTROABS 1.9 10/11/2016   HGB 12.0 10/11/2016   HCT 35.2 10/11/2016   MCV 91.9 10/11/2016   PLT 192 10/11/2016      Chemistry      Component Value Date/Time   NA 143 08/30/2016 1130   K 4.3 08/30/2016 1130   CL 107 07/23/2016 0550   CO2 26 08/30/2016 1130   BUN 15.5 08/30/2016 1130   CREATININE 1.0 08/30/2016 1130      Component Value Date/Time   CALCIUM 9.4 08/30/2016 1130   ALKPHOS 57 08/30/2016 1130   AST 13 08/30/2016 1130   ALT 16 08/30/2016 1130   BILITOT 0.53 08/30/2016 1130       No results found for: LABCA2  No components found for: LABCA125  No results for input(s): INR in the last 168 hours.  Urinalysis    Component Value Date/Time   COLORURINE YELLOW 07/22/2016 1132   APPEARANCEUR HAZY (A) 07/22/2016 1132   LABSPEC 1.013 07/22/2016 1132   PHURINE 5.0 07/22/2016 1132   GLUCOSEU NEGATIVE 07/22/2016 1132   GLUCOSEU NEGATIVE 10/30/2014 1513   HGBUR MODERATE (A) 07/22/2016 1132   BILIRUBINUR NEGATIVE 07/22/2016 1132   KETONESUR 5 (A) 07/22/2016 1132   PROTEINUR NEGATIVE 07/22/2016 1132   UROBILINOGEN 0.2 10/30/2014 1513   NITRITE NEGATIVE 07/22/2016 1132   LEUKOCYTESUR LARGE (A) 07/22/2016 1132     STUDIES: Ct Chest W Contrast  Result Date: 10/05/2016 CLINICAL DATA:  Right-sided breast cancer. Lung metastasis. Uterine cancer. EXAM: CT CHEST WITH CONTRAST TECHNIQUE:  Multidetector CT imaging of the chest was performed during intravenous contrast administration. CONTRAST:  1m ISOVUE-300 IOPAMIDOL (ISOVUE-300) INJECTION 61% COMPARISON:  Plain films 08/04/2016.  Most recent CT of 06/22/2016. FINDINGS: Cardiovascular: Aortic and branch vessel atherosclerosis. Mild cardiomegaly, without pericardial effusion. Lad and left circumflex coronary artery atherosclerosis. No central pulmonary embolism, on this non-dedicated study. Mediastinum/Nodes: No supraclavicular adenopathy. No mediastinal or hilar adenopathy. No internal mammary adenopathy. Lungs/Pleura: No pleural fluid. Medial right upper lobe 3 mm pulmonary nodule is similar to decreased from 4 mm on the prior. Nodule along the left major fissure measures 3 mm on image 75/series 5 and is decreased from 4 mm on the prior. Nodule along the right major fissure is improved to resolved. Other scattered tiny subpleural pulmonary nodules which are felt to be similar. Example at 2 mm in the right apex on image 29/series 5 and within the right lower lobe on image 69/series 5. Upper Abdomen: Nonspecific caudate lobe enlargement. Cholecystectomy. Moderate intra and extrahepatic biliary duct dilatation. Common duct 2.2 cm on coronal image 60 today versus similar on the prior (when remeasured). Not entirely imaged distally. Normal imaged portions of the spleen, stomach, pancreas, right adrenal gland, left kidney. Upper pole right renal hypoattenuating lesion is again greater than fluid density at 2.3 cm, similar in size to on the prior. Minimal left adrenal nodularity is similar at 10 mm. Musculoskeletal: No axillary adenopathy. Soft tissue density in the lateral right breast measures 2.3 x 1.3 cm today versus 2.5 x 1.3 cm on the prior. No acute osseous abnormality. IMPRESSION: 1. Mildly decreased size of small pulmonary nodules. 2. No  thoracic adenopathy. 3.  Coronary artery atherosclerosis. Aortic atherosclerosis. 4. Cholecystectomy with  similar biliary duct dilatation. 5. Upper pole right renal lesion remains indeterminate but is unchanged in size. Electronically Signed   By: Abigail Miyamoto M.D.   On: 10/05/2016 13:31   US Breast Ltd Uni Left Inc Axilla  Result Date: 10/04/2016 CLINICAL DATA:  Two biopsy-proven malignancies on the right. Follow-up after therapy. New focal pain on the left recently which has resolved. EXAM: 2D DIGITAL DIAGNOSTIC BILATERAL MAMMOGRAM WITH CAD AND ADJUNCT TOMO ULTRASOUND BILATERAL BREAST COMPARISON:  Previous exam(s). ACR Breast Density Category c: The breast tissue is heterogeneously dense, which may obscure small masses. FINDINGS: The 4 o'clock mass on the right is no longer visualized. A coil shaped clip is seen in this region. The dominant 12 o'clock mass is smaller mammographically. 1 of the adjacent smaller satellite lesions at 12 o'clock is smaller. The other adjacent 12 o'clock satellite lesion is not visualized. No suspicious masses on the left. Mammographic images were processed with CAD. On physical exam, no suspicious lumps identified today. Targeted ultrasound is performed, showing no abnormalities in the region of the patient's recent left-sided symptoms. The dominant mass at 12 o'clock, 10 cm from the nipple measures 12 x 9 x 15 mm today versus 16 x 15 by 20 mm previously. The satellite lesion at 12 o'clock, 8 cm from the nipple measures 5 x 5 x 5 mm today versus 6 x 7 x 5 mm previously. The satellite lesion at 12 o'clock, 4 cm from the nipple measures 6 x 5 x 8 mm today versus 8 x 7 x 8 mm previously. The 4 o'clock mass is no longer visualized. No abnormal lymph nodes seen in the right axilla. IMPRESSION: The dominant mass in the right breast at 12 o'clock and both of the adjacent satellite lesions are smaller in the interval. The known malignancy at 4 o'clock is no longer visualized with mammography or ultrasound. Right axillary lymph nodes are now normal in appearance. No abnormalities identified on  the left. RECOMMENDATION: Continued surgical and oncologic follow up of the known malignancies on the right. I have discussed the findings and recommendations with the patient. Results were also provided in writing at the conclusion of the visit. If applicable, a reminder letter will be sent to the patient regarding the next appointment. BI-RADS CATEGORY  6: Known biopsy-proven malignancy. Electronically Signed   By: Dorise Bullion III M.D   On: 10/04/2016 13:23   US Breast Ltd Uni Right Inc Axilla  Result Date: 10/04/2016 CLINICAL DATA:  Two biopsy-proven malignancies on the right. Follow-up after therapy. New focal pain on the left recently which has resolved. EXAM: 2D DIGITAL DIAGNOSTIC BILATERAL MAMMOGRAM WITH CAD AND ADJUNCT TOMO ULTRASOUND BILATERAL BREAST COMPARISON:  Previous exam(s). ACR Breast Density Category c: The breast tissue is heterogeneously dense, which may obscure small masses. FINDINGS: The 4 o'clock mass on the right is no longer visualized. A coil shaped clip is seen in this region. The dominant 12 o'clock mass is smaller mammographically. 1 of the adjacent smaller satellite lesions at 12 o'clock is smaller. The other adjacent 12 o'clock satellite lesion is not visualized. No suspicious masses on the left. Mammographic images were processed with CAD. On physical exam, no suspicious lumps identified today. Targeted ultrasound is performed, showing no abnormalities in the region of the patient's recent left-sided symptoms. The dominant mass at 12 o'clock, 10 cm from the nipple measures 12 x 9 x 15 mm today versus  16 x 15 by 20 mm previously. The satellite lesion at 12 o'clock, 8 cm from the nipple measures 5 x 5 x 5 mm today versus 6 x 7 x 5 mm previously. The satellite lesion at 12 o'clock, 4 cm from the nipple measures 6 x 5 x 8 mm today versus 8 x 7 x 8 mm previously. The 4 o'clock mass is no longer visualized. No abnormal lymph nodes seen in the right axilla. IMPRESSION: The dominant  mass in the right breast at 12 o'clock and both of the adjacent satellite lesions are smaller in the interval. The known malignancy at 4 o'clock is no longer visualized with mammography or ultrasound. Right axillary lymph nodes are now normal in appearance. No abnormalities identified on the left. RECOMMENDATION: Continued surgical and oncologic follow up of the known malignancies on the right. I have discussed the findings and recommendations with the patient. Results were also provided in writing at the conclusion of the visit. If applicable, a reminder letter will be sent to the patient regarding the next appointment. BI-RADS CATEGORY  6: Known biopsy-proven malignancy. Electronically Signed   By: Dorise Bullion III M.D   On: 10/04/2016 13:23   Mm Diag Breast Tomo Bilateral  Result Date: 10/04/2016 CLINICAL DATA:  Two biopsy-proven malignancies on the right. Follow-up after therapy. New focal pain on the left recently which has resolved. EXAM: 2D DIGITAL DIAGNOSTIC BILATERAL MAMMOGRAM WITH CAD AND ADJUNCT TOMO ULTRASOUND BILATERAL BREAST COMPARISON:  Previous exam(s). ACR Breast Density Category c: The breast tissue is heterogeneously dense, which may obscure small masses. FINDINGS: The 4 o'clock mass on the right is no longer visualized. A coil shaped clip is seen in this region. The dominant 12 o'clock mass is smaller mammographically. 1 of the adjacent smaller satellite lesions at 12 o'clock is smaller. The other adjacent 12 o'clock satellite lesion is not visualized. No suspicious masses on the left. Mammographic images were processed with CAD. On physical exam, no suspicious lumps identified today. Targeted ultrasound is performed, showing no abnormalities in the region of the patient's recent left-sided symptoms. The dominant mass at 12 o'clock, 10 cm from the nipple measures 12 x 9 x 15 mm today versus 16 x 15 by 20 mm previously. The satellite lesion at 12 o'clock, 8 cm from the nipple measures 5 x 5  x 5 mm today versus 6 x 7 x 5 mm previously. The satellite lesion at 12 o'clock, 4 cm from the nipple measures 6 x 5 x 8 mm today versus 8 x 7 x 8 mm previously. The 4 o'clock mass is no longer visualized. No abnormal lymph nodes seen in the right axilla. IMPRESSION: The dominant mass in the right breast at 12 o'clock and both of the adjacent satellite lesions are smaller in the interval. The known malignancy at 4 o'clock is no longer visualized with mammography or ultrasound. Right axillary lymph nodes are now normal in appearance. No abnormalities identified on the left. RECOMMENDATION: Continued surgical and oncologic follow up of the known malignancies on the right. I have discussed the findings and recommendations with the patient. Results were also provided in writing at the conclusion of the visit. If applicable, a reminder letter will be sent to the patient regarding the next appointment. BI-RADS CATEGORY  6: Known biopsy-proven malignancy. Electronically Signed   By: Dorise Bullion III M.D   On: 10/04/2016 13:23     ELIGIBLE FOR AVAILABLE RESEARCH PROTOCOL: no  ASSESSMENT: 71 y.o. [place] woman with  a remote history of early stage endometrial cancer, now status post right breast upper outer quadrant biopsy 01/22/2016 for a clinically multifocal T2 N0, stage 2A invasive ductal carcinoma, grade 1, estrogen and progesterone receptor positive, HER-2 negative, with an MIB-1 between 10 and 15%.  (1) right axillary lymph node biopsy 03/02/2016 positive  (2) genetics testing 01/13/2016 through the Custom gene panel offered by [facility] found no deleterious mutations in  ATM, BARD1, BRCA1, BRCA2, BRIP1, CDH1, CHEK2, EPCAM, FANCC, MLH1, MSH2, MSH6, MUTYH, NBN, PALB2, PMS2, POLD1, PTEN, RAD51C, RAD51D, TP53, and XRCC2  METASTATIC DISEASE: OCT 2017 (3) CT scans of the chest abdomen and pelvis obtained 03/10/2016 are consistent with bilateral lung metastases and mediastinal and hilar nodal  involvement, but no liver or bone spread  (a) bronchoscopic lymph node biopsy 2 (station 7, 13R) 03/28/2016 confirms metastatic adenocarcinoma, estrogen receptor positive, HER-2 not amplified  (b) baseline CA-27-29 on 04/18/2016 was 137.5.  (4) letrozole started 03/15/2016, palbociclib added 03/29/2016 at 125 mg/day, 21/7  (a) dose decreased to 100 mg per day, 21/7, beginning with February cycle  PLAN: I spent a little over 30 minutes with Averyana and her family going over her situation. The reports are very favorable. They show measurable shrinkage in her right breast and axilla, nothing of consequence in the left breast, and stable lung findings.  She continues to tolerate the letrozole and palbociclib well. She was having some low counts on the palbociclib but her marrow has recovered and we are continuing at 100 mg daily at this point. She very likely will need another dose reduction before the next 3 months or up.  I had to review this study carefully with her because she had many questions and some misconceptions but in the end she understands this is good news.  The plan is to continue the current treatment, with labs including visits every 4 weeks. This will help her continue to understand her course and reduce anxiety and depression.  I do not believe she is going to proved to have metastatic disease to the brain but we have not had any brain imaging since she had her diagnosis of breast cancer in the family does feel there has been a change. Accordingly a brain MRI has been ordered.  I'm going to repeat the right breast and right axillary ultrasound tomography in 3 months. If we see continued response, I think it may be appropriate to refer her back to surgery for consideration of mastectomy or lumpectomy plus radiation (note that she is terrified of radiation so might choose a mastectomy on that basis).  Chauncey Cruel, MD   10/11/2016 4:04 PM Medical Oncology and Hematology [facility] [address] [place], [place] [address] Tel. [phone]    Fax. [phone]

## 2016-10-12 LAB — CANCER ANTIGEN 27.29: CA 27.29: 36.2 U/mL (ref 0.0–38.6)

## 2016-10-21 ENCOUNTER — Ambulatory Visit (HOSPITAL_COMMUNITY)
Admission: RE | Admit: 2016-10-21 | Discharge: 2016-10-21 | Disposition: A | Discharge status: Home / Self Care | Payer: Medicare Other | Source: Ambulatory Visit | Attending: Oncology | Admitting: Oncology

## 2016-10-21 DIAGNOSIS — C50919 Malignant neoplasm of unspecified site of unspecified female breast: Secondary | ICD-10-CM | POA: Diagnosis not present

## 2016-10-21 DIAGNOSIS — R4182 Altered mental status, unspecified: Secondary | ICD-10-CM | POA: Diagnosis present

## 2016-10-21 DIAGNOSIS — Z170 Estrogen receptor positive status [ER+]: Secondary | ICD-10-CM | POA: Insufficient documentation

## 2016-10-21 DIAGNOSIS — C50411 Malignant neoplasm of upper-outer quadrant of right female breast: Secondary | ICD-10-CM

## 2016-10-21 DIAGNOSIS — G939 Disorder of brain, unspecified: Secondary | ICD-10-CM | POA: Diagnosis not present

## 2016-10-21 MED ORDER — GADOBENATE DIMEGLUMINE 529 MG/ML IV SOLN
20.0000 mL | Freq: Once | INTRAVENOUS | Status: AC | PRN
  Administered 2016-10-21: 20 mL via INTRAVENOUS

## 2016-10-27 ENCOUNTER — Ambulatory Visit: Payer: Medicare Other | Admitting: Oncology

## 2016-11-10 ENCOUNTER — Ambulatory Visit (HOSPITAL_BASED_OUTPATIENT_CLINIC_OR_DEPARTMENT_OTHER): Payer: Medicare Other | Admitting: Oncology

## 2016-11-10 ENCOUNTER — Other Ambulatory Visit (HOSPITAL_BASED_OUTPATIENT_CLINIC_OR_DEPARTMENT_OTHER): Payer: Medicare Other

## 2016-11-10 VITALS — BP 164/60 | HR 64 | Temp 98.2°F | Resp 18 | Ht 64.0 in | Wt 236.2 lb

## 2016-11-10 DIAGNOSIS — C50411 Malignant neoplasm of upper-outer quadrant of right female breast: Secondary | ICD-10-CM

## 2016-11-10 DIAGNOSIS — C7800 Secondary malignant neoplasm of unspecified lung: Secondary | ICD-10-CM

## 2016-11-10 DIAGNOSIS — G620 Drug-induced polyneuropathy: Secondary | ICD-10-CM | POA: Diagnosis not present

## 2016-11-10 DIAGNOSIS — Z8542 Personal history of malignant neoplasm of other parts of uterus: Secondary | ICD-10-CM | POA: Diagnosis not present

## 2016-11-10 DIAGNOSIS — T451X5A Adverse effect of antineoplastic and immunosuppressive drugs, initial encounter: Secondary | ICD-10-CM

## 2016-11-10 DIAGNOSIS — C7802 Secondary malignant neoplasm of left lung: Secondary | ICD-10-CM

## 2016-11-10 DIAGNOSIS — C7801 Secondary malignant neoplasm of right lung: Secondary | ICD-10-CM | POA: Diagnosis not present

## 2016-11-10 DIAGNOSIS — C773 Secondary and unspecified malignant neoplasm of axilla and upper limb lymph nodes: Secondary | ICD-10-CM

## 2016-11-10 DIAGNOSIS — K760 Fatty (change of) liver, not elsewhere classified: Secondary | ICD-10-CM

## 2016-11-10 DIAGNOSIS — Z170 Estrogen receptor positive status [ER+]: Secondary | ICD-10-CM

## 2016-11-10 LAB — CBC WITH DIFFERENTIAL/PLATELET
BASO%: 0.6 % (ref 0.0–2.0)
Basophils Absolute: 0 10*3/uL (ref 0.0–0.1)
EOS%: 1.3 % (ref 0.0–7.0)
Eosinophils Absolute: 0 10*3/uL (ref 0.0–0.5)
HCT: 34.5 % — ABNORMAL LOW (ref 34.8–46.6)
HGB: 11.6 g/dL (ref 11.6–15.9)
LYMPH%: 55.3 % — ABNORMAL HIGH (ref 14.0–49.7)
MCH: 31.4 pg (ref 25.1–34.0)
MCHC: 33.6 g/dL (ref 31.5–36.0)
MCV: 93.5 fL (ref 79.5–101.0)
MONO#: 0.3 10*3/uL (ref 0.1–0.9)
MONO%: 10.4 % (ref 0.0–14.0)
NEUT#: 1 10*3/uL — ABNORMAL LOW (ref 1.5–6.5)
NEUT%: 32.4 % — ABNORMAL LOW (ref 38.4–76.8)
Platelets: 128 10*3/uL — ABNORMAL LOW (ref 145–400)
RBC: 3.69 10*6/uL — ABNORMAL LOW (ref 3.70–5.45)
RDW: 16.9 % — ABNORMAL HIGH (ref 11.2–14.5)
WBC: 3.1 10*3/uL — ABNORMAL LOW (ref 3.9–10.3)
lymph#: 1.7 10*3/uL (ref 0.9–3.3)

## 2016-11-10 LAB — COMPREHENSIVE METABOLIC PANEL
ALT: 15 U/L (ref 0–55)
AST: 14 U/L (ref 5–34)
Albumin: 3.9 g/dL (ref 3.5–5.0)
Alkaline Phosphatase: 58 U/L (ref 40–150)
Anion Gap: 6 mEq/L (ref 3–11)
BUN: 12.3 mg/dL (ref 7.0–26.0)
CO2: 28 mEq/L (ref 22–29)
Calcium: 9.6 mg/dL (ref 8.4–10.4)
Chloride: 109 mEq/L (ref 98–109)
Creatinine: 0.8 mg/dL (ref 0.6–1.1)
EGFR: 71 mL/min/{1.73_m2} — ABNORMAL LOW (ref >90)
Glucose: 150 mg/dl — ABNORMAL HIGH (ref 70–140)
Potassium: 4.5 mEq/L (ref 3.5–5.1)
Sodium: 142 mEq/L (ref 136–145)
Total Bilirubin: 0.57 mg/dL (ref 0.20–1.20)
Total Protein: 6.7 g/dL (ref 6.4–8.3)

## 2016-11-10 MED FILL — IBRANCE 100 MG CAPSULE: 100 | 28 days supply | Qty: 21 | Fill #3

## 2016-11-10 NOTE — Progress Notes (Signed)
[place]  [phone] [phone]     ID: MIKAH ROTTINGHAUS DOB: 1945-12-28  MR#: [id number]  VAP#:[id number]  Patient Care Team: Biagio Borg, MD as PCP - Stevan Born, MD as Consulting Physician (General Surgery) Magrinat, Virgie Dad, MD as Consulting Physician (Oncology) Kyung Rudd, MD as Consulting Physician (Radiation Oncology) Bobbye Charleston, MD as Consulting Physician (Obstetrics and Gynecology) Nada Libman, MD as Referring Physician (Specialist) Vevelyn Royals, MD as Consulting Physician (Ophthalmology) Lamonte Sakai Rose Fillers, MD as Consulting Physician (Pulmonary Disease) OTHER MD:  CHIEF COMPLAINT: Estrogen receptor positive breast cancer  CURRENT TREATMENT: Letrozole, palbociclib   BREAST CANCER HISTORY: From the original intake note:  Joanie had screening mammography showing some suspicious calcifications in the right breast leading to right diagnostic mammography with ultrasonography 01/22/2016 at [facility]. The breast density was category C. In the upper right breast there was a 2.3 cm mass with additional masses measuring 0.9 and 0.7 cm. There was also a possible additional 0.8 mass in the lower inner quadrant. Ultrasound confirmed an irregular hypoechoic mass in the right breast upper outer quadrant measuring 2.0 cm. There were other masses measuring 0.7 and 0.8 cm by ultrasonography. The right axilla was sonographically benign.  Biopsy of a 12:00 and 4:00 mass in the right breast 01/22/2016 showed ([id number]) both specimens showing invasive ductal carcinoma, grade 1 or 2, both 95% estrogen receptor positive, both 95% progesterone receptor positive, both with strong staining intensity, with MIB-1 ranging from 10-15%, and both HER-2 negative, the signals ratio being 1.23-1.42, and the number per cell 1.85-2.59.  Her subsequent history is as detailed below  INTERVAL HISTORY: Tekeyah returns today for follow-up of her estrogen  receptor positive breast cancer accompanied by her husband. She continues on letrozole, with good tolerance.  Hot flashes and vaginal dryness are not a major issue. She never developed the arthralgias or myalgias that many patients can experience on this medication. She obtains it at a good price.  She is also on palbociclib, at 800 mg a day. Today would be day 1. She has no side effects from that medication that she is aware of but it is causing her some neutropenia.  Since the last visit here we completed her staging studies with a brain MRI on 10/21/2016. This was benign.   REVIEW OF SYSTEMS: They have been no intercurrent infections, no bleeding episodes, also no nausea, vomiting, dizziness, or gait imbalance. She has significant knee problems and she notices this particularly when she stands from a sitting position. She tells me she is now eating a better diet, with more vegetables and less carbohydrates. A detailed review of systems today was otherwise stable  PAST MEDICAL HISTORY: Past Medical History:  Diagnosis Date  . Breast cancer ([place])   . Cancer ([place]) 02/2016   right breast  . DIABETES MELLITUS, TYPE II 01/04/2007   only takes actoplus daily  . Dizziness and giddiness 02/29/2008  . DVT, HX OF    at age 84 in right buttocks  . Family history of breast cancer   . GERD 01/04/2007   pt reports resolved   . GLAUCOMA 07/30/2008   both eyes  . History of blood transfusion    no abnormal  reaction  . History of uterine cancer 2000   hysterectomy done  . HYPERLIPIDEMIA 01/04/2007   taking Pravastatin daily  . HYPERTENSION 01/04/2007   takes Lisinopril daily  . Joint pain   . Joint swelling   . LEG  PAIN, LEFT 07/06/2007  . NUMBNESS 07/30/2008   in fingers;pt states from Diamox  . OSTEOARTHRITIS, HIP 09/25/2009  . OTITIS MEDIA, ACUTE, BILATERAL 02/29/2008  . Overweight(278.02) 01/04/2007  . PONV (postoperative nausea and vomiting)   . SLEEP APNEA, OBSTRUCTIVE    doesn't use a  cpap;study done about 46yr ago  . TRANSIENT ISCHEMIC ATTACK, HX OF 01/04/2007  . Vision loss    left eye    PAST SURGICAL HISTORY: Past Surgical History:  Procedure Laterality Date  . ABDOMINAL HYSTERECTOMY  2000  . CHOLECYSTECTOMY    . ENDOBRONCHIAL ULTRASOUND Bilateral 03/28/2016   Procedure: ENDOBRONCHIAL ULTRASOUND;  Surgeon: RCollene Gobble MD;  Location: [facility];  Service: Cardiopulmonary;  Laterality: Bilateral;  . EYE SURGERY  13   shunt left and lazer eye surgery on right cataract and retenia tear with repair  . growth removal  2004   from thumb  . KNEE ARTHROSCOPY Right   . mulitple eye surgeries     both eyes, cataracts with ioc done both eyes  . OOPHORECTOMY    . TOTAL HIP ARTHROPLASTY  06/24/2011   Procedure: TOTAL HIP ARTHROPLASTY;  Surgeon: FKerin Salen  [facility]  Service: Orthopedics;  Laterality: Right;  . TOTAL HIP ARTHROPLASTY Left 11/12/2012   Dr RMayer Camel . TOTAL HIP ARTHROPLASTY Left 11/12/2012   Procedure: TOTAL HIP ARTHROPLASTY;  Surgeon: FKerin Salen MD;  [place] [facility]  Service: Orthopedics;  Laterality: Left;  DEPUY PINNACLE    FAMILY HISTORY Family History  Problem Relation Age of Onset  . Dementia Mother   . Cancer Mother        Breast and lung cancer  . Stroke Sister   . Breast cancer Sister 546 . Heart attack Maternal Aunt   . Lung cancer Maternal Grandmother        non smoker  . Glaucoma Maternal Grandfather   . Anesthesia problems Neg Hx   The patient's father died at age 71 the patient's mother died at age 71 She had breast and lung cancers diagnosed shortly before her death. The patient had no brothers, 2 sisters. One sister was diagnosed with breast cancer at the age of 542  GYNECOLOGIC HISTORY:  No LMP recorded. Patient has had a hysterectomy. Menarche age 71 first live birth age 89 the patient is GX P1. She had a hysterectomy for endometrial cancer in the year 2000. She did not take hormone replacement. She did use  oral contraceptives for your than 20 years remotely, with no complications.  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 She is home with her husband [name] He is a retired mDealerTheir son CJuanda [place]also lives in [place]    ADVANCED DIRECTIVES: In place  HEALTH MAINTENANCE: Social History  Substance Use Topics  . Smoking status: Never Smoker  . Smokeless tobacco: Never Used  . Alcohol use No     Colonoscopy: Never  PAP: Status post hysterectomy  Bone density: Remote   Allergies  Allergen Reactions  . Codeine Hives    Hycodan syrup  . Fluorescein Nausea And Vomiting    ? IV dye for retina specialist  . Lipitor [Atorvastatin Calcium]     Leg cramp  . Oxycodone Nausea And Vomiting    Patient vomited for 3 days after taking  . Sitagliptin Phosphate Nausea And Vomiting  . Sulfa Drugs Cross Reactors Nausea And Vomiting  Current Outpatient Prescriptions  Medication Sig Dispense Refill  . acetaminophen (TYLENOL) 500 MG tablet Take 1,000 mg by mouth every 4 (four) hours as needed for moderate pain or fever.    Marland Kitchen aspirin 81 MG tablet Take 81 mg by mouth daily. Reported on 06/12/2015    . bimatoprost (LUMIGAN) 0.01 % SOLN 1 drop into each eye once nightly.    . brimonidine-timolol (COMBIGAN) 0.2-0.5 % ophthalmic solution Place 1 drop into both eyes three times daily    . letrozole (FEMARA) 2.5 MG tablet Take 1 tablet (2.5 mg total) by mouth at bedtime.    . lovastatin (MEVACOR) 20 MG tablet Take 1 tablet (20 mg total) by mouth every evening. 90 tablet 3  . palbociclib (IBRANCE) 100 MG capsule Take 1 capsule (100 mg total) by mouth daily with breakfast. Take whole with food. 21 capsule 6  . pioglitazone-metformin (ACTOPLUS MET) 15-500 MG tablet Take 15-500 tablets by mouth daily. (Patient taking differently: Take 1 tablet by mouth daily. ) 90 tablet 3  . predniSONE (DELTASONE) 20 MG tablet Take 2  tablets (40 mg total) by mouth daily with breakfast. 6 tablet 0  . prochlorperazine (COMPAZINE) 10 MG tablet Take 1 tablet (10 mg total) by mouth every 6 (six) hours as needed for nausea or vomiting. 30 tablet 0  . Vitamin D, Cholecalciferol, 1000 units TABS Take 1,000 Units by mouth every evening.      No current facility-administered medications for this visit.     OBJECTIVE: Middle-aged white womanWho appears stated age  26:   11/10/16 1230  BP: (!) 164/60  Pulse: 64  Resp: 18  Temp: 98.2 F (36.8 C)     Body mass index is 40.54 kg/m.    ECOG FS:1 - Symptomatic but completely ambulatory Filed Weights   11/10/16 1230  Weight: 236 lb 3.2 oz (107.1 kg)    Sclerae unicteric, EOMs intact Oropharynx clear and moist No cervical or supraclavicular adenopathy Lungs no rales or rhonchi Heart regular rate and rhythm Abd soft, nontender, positive bowel sounds MSK no focal spinal tenderness, no upper extremity lymphedema Neuro: nonfocal, well oriented, appropriate affect Breasts: Deferred    LAB RESULTS:  CMP     Component Value Date/Time   NA 143 10/11/2016 1535   K 4.2 10/11/2016 1535   CL 107 07/23/2016 0550   CO2 28 10/11/2016 1535   GLUCOSE 103 10/11/2016 1535   BUN 10.8 10/11/2016 1535   CREATININE 0.8 10/11/2016 1535   CALCIUM 9.8 10/11/2016 1535   PROT 7.2 10/11/2016 1535   ALBUMIN 4.2 10/11/2016 1535   AST 20 10/11/2016 1535   ALT 26 10/11/2016 1535   ALKPHOS 56 10/11/2016 1535   BILITOT 0.52 10/11/2016 1535   GFRNONAA >60 07/23/2016 0550   GFRAA >60 07/23/2016 0550    INo results found for: SPEP, UPEP  Lab Results  Component Value Date   WBC 3.1 (L) 11/10/2016   NEUTROABS 1.0 (L) 11/10/2016   HGB 11.6 11/10/2016   HCT 34.5 (L) 11/10/2016   MCV 93.5 11/10/2016   PLT 128 (L) 11/10/2016      Chemistry      Component Value Date/Time   NA 143 10/11/2016 1535   K 4.2 10/11/2016 1535   CL 107 07/23/2016 0550   CO2 28 10/11/2016 1535   BUN 10.8  10/11/2016 1535   CREATININE 0.8 10/11/2016 1535      Component Value Date/Time   CALCIUM 9.8 10/11/2016 1535   ALKPHOS 56 10/11/2016 1535  AST 20 10/11/2016 1535   ALT 26 10/11/2016 1535   BILITOT 0.52 10/11/2016 1535       No results found for: LABCA2  No components found for: LABCA125  No results for input(s): INR in the last 168 hours.  Urinalysis    Component Value Date/Time   COLORURINE YELLOW 07/22/2016 1132   APPEARANCEUR HAZY (A) 07/22/2016 1132   LABSPEC 1.013 07/22/2016 1132   PHURINE 5.0 07/22/2016 1132   GLUCOSEU NEGATIVE 07/22/2016 1132   GLUCOSEU NEGATIVE 10/30/2014 1513   HGBUR MODERATE (A) 07/22/2016 1132   BILIRUBINUR NEGATIVE 07/22/2016 1132   KETONESUR 5 (A) 07/22/2016 1132   PROTEINUR NEGATIVE 07/22/2016 1132   UROBILINOGEN 0.2 10/30/2014 1513   NITRITE NEGATIVE 07/22/2016 1132   LEUKOCYTESUR LARGE (A) 07/22/2016 1132     STUDIES: Mr Laqueta Jean [facility] Contrast  Result Date: 10/21/2016 CLINICAL DATA:  Breast cancer.  Mental status change. EXAM: MRI HEAD WITHOUT AND WITH CONTRAST TECHNIQUE: Multiplanar, multiecho pulse sequences of the brain and surrounding structures were obtained without and with intravenous contrast. CONTRAST:  29mL MULTIHANCE GADOBENATE DIMEGLUMINE 529 MG/ML IV SOLN COMPARISON:  Brain MRI 02/25/2010 and 06/27/2005 FINDINGS: Brain: The other midline structures are normal. No focal diffusion restriction to indicate acute infarct. No intraparenchymal hemorrhage. There is multifocal hyperintense T2-weighted signal within the periventricular white matter, most often seen in the setting of chronic microvascular ischemia. Unchanged pineal cyst. No mass lesion. No chronic microhemorrhage or cerebral amyloid angiopathy. No hydrocephalus, age advanced atrophy or lobar predominant volume loss. No dural abnormality or extra-axial collection. Vascular: Major intracranial arterial and venous sinus flow voids are preserved. Skull and upper cervical spine:  The visualized skull base, calvarium, upper cervical spine and extracranial soft tissues are normal. Sinuses/Orbits: No fluid levels or advanced mucosal thickening. No mastoid effusion. Normal orbits. IMPRESSION: 1. No metastatic disease or other acute intracranial abnormality. 2. White matter hyperintensities most commonly seen in the setting of chronic microvascular ischemia, which have progressed from the study of 02/25/2010. Electronically Signed   By: Deatra Robinson M.D.   On: 10/21/2016 19:33     ELIGIBLE FOR AVAILABLE RESEARCH PROTOCOL: no  ASSESSMENT: 71 y.o. [place] woman with a remote history of early stage endometrial cancer, now status post right breast upper outer quadrant biopsy 01/22/2016 for a clinically multifocal T2 N0, stage 2A invasive ductal carcinoma, grade 1, estrogen and progesterone receptor positive, HER-2 negative, with an MIB-1 between 10 and 15%.  (1) right axillary lymph node biopsy 03/02/2016 positive  (2) genetics testing 01/13/2016 through the Custom gene panel offered by [facility] found no deleterious mutations in  ATM, BARD1, BRCA1, BRCA2, BRIP1, CDH1, CHEK2, EPCAM, FANCC, MLH1, MSH2, MSH6, MUTYH, NBN, PALB2, PMS2, POLD1, PTEN, RAD51C, RAD51D, TP53, and XRCC2  METASTATIC DISEASE: OCT 2017 (3) CT scans of the chest abdomen and pelvis obtained 03/10/2016 are consistent with bilateral lung metastases and mediastinal and hilar nodal involvement, but no liver or bone spread  (a) bronchoscopic lymph node biopsy 2 (station 7, 13R) 03/28/2016 confirms metastatic adenocarcinoma, estrogen receptor positive, HER-2 not amplified  (b) baseline CA-27-29 on 04/18/2016 was 137.5.  (4) letrozole started 03/15/2016, palbociclib added 03/29/2016 at 125 mg/day, 21/7  (a) dose decreased to 100 mg per day, 21/7, beginning with February cycle  PLAN: Keliyah is tolerating her anti-estrogens and cycling inhibitors quite well. They have controlled her measurable disease, which is  also shrunk.  I think at this point it may be appropriate to start considering completion local treatment and  I'm referring her back to her surgeon Dr. Lucia Gaskins regarding this.  Instead of dropping her palbociclib dose we are going to wait an extra week before she starts her next cycle. With the next cycle however we will probably go to 75 mg daily  She has a good understanding of this plan. She knows to call for any problems that may develop before her next visit.  Chauncey Cruel, MD   11/10/2016 12:41 PM Medical Oncology and Hematology [facility] [address] [place], [place] [address] Tel. [phone]    Fax. [phone]

## 2016-11-11 LAB — CANCER ANTIGEN 27.29: CA 27.29: 26 U/mL (ref 0.0–38.6)

## 2016-11-12 DIAGNOSIS — G620 Drug-induced polyneuropathy: Secondary | ICD-10-CM | POA: Insufficient documentation

## 2016-11-12 DIAGNOSIS — T451X5A Adverse effect of antineoplastic and immunosuppressive drugs, initial encounter: Secondary | ICD-10-CM | POA: Insufficient documentation

## 2016-11-16 DIAGNOSIS — H401134 Primary open-angle glaucoma, bilateral, indeterminate stage: Secondary | ICD-10-CM | POA: Diagnosis not present

## 2016-11-16 LAB — HM DIABETES EYE EXAM

## 2016-11-17 ENCOUNTER — Encounter: Payer: Self-pay | Admitting: Internal Medicine

## 2016-11-17 ENCOUNTER — Ambulatory Visit (INDEPENDENT_AMBULATORY_CARE_PROVIDER_SITE_OTHER): Payer: Medicare Other | Admitting: Internal Medicine

## 2016-11-17 VITALS — BP 136/84 | HR 67 | Ht 64.0 in | Wt 236.0 lb

## 2016-11-17 DIAGNOSIS — I10 Essential (primary) hypertension: Secondary | ICD-10-CM | POA: Diagnosis not present

## 2016-11-17 DIAGNOSIS — F418 Other specified anxiety disorders: Secondary | ICD-10-CM

## 2016-11-17 DIAGNOSIS — E119 Type 2 diabetes mellitus without complications: Secondary | ICD-10-CM

## 2016-11-17 DIAGNOSIS — Z01818 Encounter for other preprocedural examination: Secondary | ICD-10-CM | POA: Diagnosis not present

## 2016-11-17 MED ORDER — LISINOPRIL 20 MG PO TABS: 20.0000 mg | ORAL_TABLET | Freq: Every day | ORAL | 3 refills | Status: DC

## 2016-11-17 NOTE — Patient Instructions (Signed)
Ok to restart the blood pressure in the form of lisinopril 20 mg per day  Please check your  Blood Pressure on a regular basis at home and your dcotor visits, with the goal being less than 140/90  Your clearance letter will be sent to Dr Ander Slade  Please continue all other medications as before, and refills have been done if requested.  Please have the pharmacy call with any other refills you may need.  Please keep your appointments with your specialists as you may have planned

## 2016-11-17 NOTE — Progress Notes (Signed)
Subjective:    Patient ID: [name], female    DOB: 20-Oct-1945, 71 y.o.   MRN: [id number]  HPI  Here to f/u for preop clearance for upcoming right eye procedure before sept 1, and possible later cancer related surgury; overall doing ok,  Pt denies chest pain, increasing sob or doe, wheezing, orthopnea, PND, increased LE swelling, palpitations, dizziness or syncope.  Pt denies new neurological symptoms such as new headache, or facial or extremity weakness or numbness.  Pt denies polydipsia, polyuria, or low sugar episode.   Pt denies new neurological symptoms such as new headache, or facial or extremity weakness or numbness.   Pt states overall good compliance with meds, mostly trying to follow appropriate diet, with wt overall stable,  but little exercise however.  BP at home and MD offices were mostly several time > 160 over the last 2 months; adivsed to check BP here. Recent sugars < 140 at home.  Quite irritable today, and laments several times her plight of multiple recent testing and treatment which has been overwhelming.  BP at several visits to providers in the past month has been assoc with increased sbp > 160 per pt; did seem overcontrolled last visit and lisinipril HCT stopped  BP had seemed borderline elevated at worst until recent events. Past Medical History:  Diagnosis Date  . Breast cancer ([place])   . Cancer ([place]) 02/2016   right breast  . DIABETES MELLITUS, TYPE II 01/04/2007   only takes actoplus daily  . Dizziness and giddiness 02/29/2008  . DVT, HX OF    at age 57 in right buttocks  . Family history of breast cancer   . GERD 01/04/2007   pt reports resolved   . GLAUCOMA 07/30/2008   both eyes  . History of blood transfusion    no abnormal  reaction  . History of uterine cancer 2000   hysterectomy done  . HYPERLIPIDEMIA 01/04/2007   taking Pravastatin daily  . HYPERTENSION 01/04/2007   takes Lisinopril daily  . Joint pain   . Joint swelling   . LEG PAIN, LEFT 07/06/2007    . NUMBNESS 07/30/2008   in fingers;pt states from Diamox  . OSTEOARTHRITIS, HIP 09/25/2009  . OTITIS MEDIA, ACUTE, BILATERAL 02/29/2008  . Overweight(278.02) 01/04/2007  . PONV (postoperative nausea and vomiting)   . SLEEP APNEA, OBSTRUCTIVE    doesn't use a cpap;study done about 66yr ago  . TRANSIENT ISCHEMIC ATTACK, HX OF 01/04/2007  . Vision loss    left eye   Past Surgical History:  Procedure Laterality Date  . ABDOMINAL HYSTERECTOMY  2000  . CHOLECYSTECTOMY    . ENDOBRONCHIAL ULTRASOUND Bilateral 03/28/2016   Procedure: ENDOBRONCHIAL ULTRASOUND;  Surgeon: RCollene Gobble MD;  [facility];  Service: Cardiopulmonary;  Laterality: Bilateral;  . EYE SURGERY  13   shunt left and lazer eye surgery on right cataract and retenia tear with repair  . growth removal  2004   from thumb  . KNEE ARTHROSCOPY Right   . mulitple eye surgeries     both eyes, cataracts with ioc done both eyes  . OOPHORECTOMY    . TOTAL HIP ARTHROPLASTY  06/24/2011   Procedure: TOTAL HIP ARTHROPLASTY;  Surgeon: FKerin Salen  [facility]  Service: Orthopedics;  Laterality: Right;  . TOTAL HIP ARTHROPLASTY Left 11/12/2012   Dr RMayer Camel . TOTAL HIP ARTHROPLASTY Left 11/12/2012   Procedure: TOTAL HIP ARTHROPLASTY;  Surgeon: FKerin Salen MD;  Location:  [facility];  Service: Orthopedics;  Laterality: Left;  DEPUY PINNACLE    reports that she has never smoked. She has never used smokeless tobacco. She reports that she does not drink alcohol or use drugs. family history includes Breast cancer (age of onset: 69) in her sister; Cancer in her mother; Dementia in her mother; Glaucoma in her maternal grandfather; Heart attack in her maternal aunt; Lung cancer in her maternal grandmother; Stroke in her sister. Allergies  Allergen Reactions  . Codeine Hives    Hycodan syrup  . Fluorescein Nausea And Vomiting    ? IV dye for retina specialist  . Lipitor [Atorvastatin Calcium]     Leg cramp  . Oxycodone Nausea  And Vomiting    Patient vomited for 3 days after taking  . Sitagliptin Phosphate Nausea And Vomiting  . Sulfa Drugs Cross Reactors Nausea And Vomiting   Current Outpatient Prescriptions on File Prior to Visit  Medication Sig Dispense Refill  . acetaminophen (TYLENOL) 500 MG tablet Take 1,000 mg by mouth every 4 (four) hours as needed for moderate pain or fever.    Marland Kitchen aspirin 81 MG tablet Take 81 mg by mouth daily. Reported on 06/12/2015    . bimatoprost (LUMIGAN) 0.01 % SOLN 1 drop into each eye once nightly.    . brimonidine-timolol (COMBIGAN) 0.2-0.5 % ophthalmic solution Place 1 drop into both eyes three times daily    . letrozole (FEMARA) 2.5 MG tablet Take 1 tablet (2.5 mg total) by mouth at bedtime.    . lovastatin (MEVACOR) 20 MG tablet Take 1 tablet (20 mg total) by mouth every evening. 90 tablet 3  . palbociclib (IBRANCE) 100 MG capsule Take 1 capsule (100 mg total) by mouth daily with breakfast. Take whole with food. 21 capsule 6  . pioglitazone-metformin (ACTOPLUS MET) 15-500 MG tablet Take 15-500 tablets by mouth daily. (Patient taking differently: Take 1 tablet by mouth daily. ) 90 tablet 3  . prochlorperazine (COMPAZINE) 10 MG tablet Take 1 tablet (10 mg total) by mouth every 6 (six) hours as needed for nausea or vomiting. 30 tablet 0  . Vitamin D, Cholecalciferol, 1000 units TABS Take 1,000 Units by mouth every evening.      No current facility-administered medications on file prior to visit.    Review of Systems  Constitutional: Negative for other unusual diaphoresis or sweats HENT: Negative for ear discharge or swelling Eyes: Negative for other worsening visual disturbances Respiratory: Negative for stridor or other swelling  Gastrointestinal: Negative for worsening distension or other blood Genitourinary: Negative for retention or other urinary change Musculoskeletal: Negative for other MSK pain or swelling Skin: Negative for color change or other new  lesions Neurological: Negative for worsening tremors and other numbness  Psychiatric/Behavioral: Negative for worsening agitation or other fatigue All other system neg per pt    Objective:   Physical Exam BP 136/84   Pulse 67   Ht '5\' 4"'$  (1.626 m)   Wt 236 lb (107 kg)   SpO2 99%   BMI 40.51 kg/m  VS noted, not ill appearing Constitutional: Pt appears in NAD HENT: Head: NCAT.  Right Ear: External ear normal.  Left Ear: External ear normal.  Eyes: . Pupils are equal, round, and reactive to light. Conjunctivae and EOM are normal Nose: without d/c or deformity Neck: Neck supple. Gross normal ROM Cardiovascular: Normal rate and regular rhythm.   Pulmonary/Chest: Effort normal and breath sounds without rales or wheezing.  Abd:  Soft, NT, ND, + BS,  no organomegaly Neurological: Pt is alert. At baseline orientation, motor grossly intact Skin: Skin is warm. No rashes, other new lesions, no LE edema Psychiatric: Pt behavior is normal without agitation , dysphoric and nervous demeanor No other exam findings Lab Results  Component Value Date   WBC 3.1 (L) 11/10/2016   HGB 11.6 11/10/2016   HCT 34.5 (L) 11/10/2016   PLT 128 (L) 11/10/2016   GLUCOSE 150 (H) 11/10/2016   CHOL 195 04/29/2015   TRIG 129.0 04/29/2015   HDL 52.40 04/29/2015   LDLCALC 117 (H) 04/29/2015   ALT 15 11/10/2016   AST 14 11/10/2016   NA 142 11/10/2016   K 4.5 11/10/2016   CL 107 07/23/2016   CREATININE 0.8 11/10/2016   BUN 12.3 11/10/2016   CO2 28 11/10/2016   TSH 1.18 10/30/2014   INR 1.06 11/08/2012   HGBA1C 7.3 (H) 04/29/2015   MICROALBUR <0.7 10/30/2014       Assessment & Plan:

## 2016-11-18 ENCOUNTER — Encounter: Payer: Self-pay | Admitting: Internal Medicine

## 2016-11-18 DIAGNOSIS — Z01818 Encounter for other preprocedural examination: Secondary | ICD-10-CM | POA: Insufficient documentation

## 2016-11-18 NOTE — Assessment & Plan Note (Signed)
Pt ok for right eye procedure, will sent clearance ok to Dr Ander Slade, [facility] in [facility]

## 2016-11-18 NOTE — Assessment & Plan Note (Signed)
Mild to mod, declines change int x or referral at this time  to f/u any worsening symptoms or concerns

## 2016-11-18 NOTE — Assessment & Plan Note (Signed)
Lab Results  Component Value Date   HGBA1C 7.3 (H) 04/29/2015  stable overall by history and exam, recent data reviewed with pt, and pt to continue medical treatment as before,  to f/u any worsening symptoms or concerns

## 2016-11-18 NOTE — Assessment & Plan Note (Signed)
Not elevated today, but likely at least mild uncontrolled overall, to rrestart tx with lisinopril 20 qd, pt to f/u BP at home and several provider visit upcoming as well; f/u for persistent elev sbp > 140/90

## 2016-11-22 DIAGNOSIS — C7800 Secondary malignant neoplasm of unspecified lung: Secondary | ICD-10-CM | POA: Diagnosis not present

## 2016-11-22 DIAGNOSIS — C50911 Malignant neoplasm of unspecified site of right female breast: Secondary | ICD-10-CM | POA: Diagnosis not present

## 2016-11-29 ENCOUNTER — Other Ambulatory Visit: Payer: Self-pay | Admitting: Surgery

## 2016-11-29 DIAGNOSIS — C50911 Malignant neoplasm of unspecified site of right female breast: Secondary | ICD-10-CM

## 2016-11-29 DIAGNOSIS — Z170 Estrogen receptor positive status [ER+]: Principal | ICD-10-CM

## 2016-12-02 ENCOUNTER — Ambulatory Visit
Admission: RE | Admit: 2016-12-02 | Discharge: 2016-12-02 | Disposition: A | Discharge status: Home / Self Care | Payer: Medicare Other | Source: Ambulatory Visit | Attending: Surgery | Admitting: Surgery

## 2016-12-02 DIAGNOSIS — C50911 Malignant neoplasm of unspecified site of right female breast: Secondary | ICD-10-CM

## 2016-12-02 DIAGNOSIS — Z170 Estrogen receptor positive status [ER+]: Principal | ICD-10-CM

## 2016-12-02 DIAGNOSIS — N6489 Other specified disorders of breast: Secondary | ICD-10-CM | POA: Diagnosis not present

## 2016-12-02 MED ORDER — GADOBENATE DIMEGLUMINE 529 MG/ML IV SOLN
20.0000 mL | Freq: Once | INTRAVENOUS | Status: AC | PRN
  Administered 2016-12-02: 20 mL via INTRAVENOUS

## 2016-12-05 ENCOUNTER — Ambulatory Visit (HOSPITAL_BASED_OUTPATIENT_CLINIC_OR_DEPARTMENT_OTHER): Payer: Medicare Other | Admitting: Oncology

## 2016-12-05 ENCOUNTER — Other Ambulatory Visit (HOSPITAL_BASED_OUTPATIENT_CLINIC_OR_DEPARTMENT_OTHER): Payer: Medicare Other

## 2016-12-05 VITALS — BP 180/81 | HR 64 | Temp 98.1°F | Resp 18 | Ht 64.0 in | Wt 239.4 lb

## 2016-12-05 DIAGNOSIS — C7800 Secondary malignant neoplasm of unspecified lung: Secondary | ICD-10-CM

## 2016-12-05 DIAGNOSIS — K760 Fatty (change of) liver, not elsewhere classified: Secondary | ICD-10-CM

## 2016-12-05 DIAGNOSIS — C50411 Malignant neoplasm of upper-outer quadrant of right female breast: Secondary | ICD-10-CM

## 2016-12-05 DIAGNOSIS — Z8542 Personal history of malignant neoplasm of other parts of uterus: Secondary | ICD-10-CM

## 2016-12-05 DIAGNOSIS — C7801 Secondary malignant neoplasm of right lung: Secondary | ICD-10-CM

## 2016-12-05 DIAGNOSIS — Z170 Estrogen receptor positive status [ER+]: Secondary | ICD-10-CM

## 2016-12-05 LAB — COMPREHENSIVE METABOLIC PANEL
ALT: 12 U/L (ref 0–55)
AST: 10 U/L (ref 5–34)
Albumin: 3.8 g/dL (ref 3.5–5.0)
Alkaline Phosphatase: 58 U/L (ref 40–150)
Anion Gap: 6 mEq/L (ref 3–11)
BUN: 14.5 mg/dL (ref 7.0–26.0)
CO2: 27 mEq/L (ref 22–29)
Calcium: 9.5 mg/dL (ref 8.4–10.4)
Chloride: 109 mEq/L (ref 98–109)
Creatinine: 0.9 mg/dL (ref 0.6–1.1)
EGFR: 61 mL/min/{1.73_m2} — ABNORMAL LOW (ref >90)
Glucose: 115 mg/dl (ref 70–140)
Potassium: 4.2 mEq/L (ref 3.5–5.1)
Sodium: 142 mEq/L (ref 136–145)
Total Bilirubin: 0.45 mg/dL (ref 0.20–1.20)
Total Protein: 6.7 g/dL (ref 6.4–8.3)

## 2016-12-05 LAB — CBC WITH DIFFERENTIAL/PLATELET
BASO%: 0.4 % (ref 0.0–2.0)
Basophils Absolute: 0 10*3/uL (ref 0.0–0.1)
EOS%: 1.2 % (ref 0.0–7.0)
Eosinophils Absolute: 0 10*3/uL (ref 0.0–0.5)
HCT: 33.4 % — ABNORMAL LOW (ref 34.8–46.6)
HGB: 11.3 g/dL — ABNORMAL LOW (ref 11.6–15.9)
LYMPH%: 50.2 % — ABNORMAL HIGH (ref 14.0–49.7)
MCH: 31.5 pg (ref 25.1–34.0)
MCHC: 33.8 g/dL (ref 31.5–36.0)
MCV: 93 fL (ref 79.5–101.0)
MONO#: 0.2 10*3/uL (ref 0.1–0.9)
MONO%: 6.9 % (ref 0.0–14.0)
NEUT#: 1 10*3/uL — ABNORMAL LOW (ref 1.5–6.5)
NEUT%: 41.3 % (ref 38.4–76.8)
Platelets: 158 10*3/uL (ref 145–400)
RBC: 3.59 10*6/uL — ABNORMAL LOW (ref 3.70–5.45)
RDW: 16.4 % — ABNORMAL HIGH (ref 11.2–14.5)
WBC: 2.5 10*3/uL — ABNORMAL LOW (ref 3.9–10.3)
lymph#: 1.2 10*3/uL (ref 0.9–3.3)

## 2016-12-05 MED ORDER — LETROZOLE 2.5 MG PO TABS: 2.5000 mg | ORAL_TABLET | Freq: Every day | ORAL | Status: DC

## 2016-12-05 MED ORDER — PALBOCICLIB 100 MG PO CAPS: 100.0000 mg | ORAL_CAPSULE | Freq: Every day | ORAL | 6 refills | Status: DC

## 2016-12-05 NOTE — Progress Notes (Signed)
[place]  [phone] [phone]     ID: KAMRON Obrien DOB: 06-Dec-1945  MR#: [id number]  NFA#:[id number]  Patient Care Team: Biagio Borg, MD as PCP - Stevan Born, MD as Consulting Physician (General Surgery) Dorathea Faerber, Virgie Dad, MD as Consulting Physician (Oncology) Kyung Rudd, MD as Consulting Physician (Radiation Oncology) Bobbye Charleston, MD as Consulting Physician (Obstetrics and Gynecology) Nada Libman, MD as Referring Physician (Specialist) Vevelyn Royals, MD as Consulting Physician (Ophthalmology) Lamonte Sakai Rose Fillers, MD as Consulting Physician (Pulmonary Disease) OTHER MD:  CHIEF COMPLAINT: Estrogen receptor positive breast cancer  CURRENT TREATMENT: Letrozole, palbociclib   BREAST CANCER HISTORY: From the original intake note:  [name] had screening mammography showing some suspicious calcifications in the right breast leading to right diagnostic mammography with ultrasonography 01/22/2016 at [facility]. The breast density was category C. In the upper right breast there was a 2.3 cm mass with additional masses measuring 0.9 and 0.7 cm. There was also a possible additional 0.8 mass in the lower inner quadrant. Ultrasound confirmed an irregular hypoechoic mass in the right breast upper outer quadrant measuring 2.0 cm. There were other masses measuring 0.7 and 0.8 cm by ultrasonography. The right axilla was sonographically benign.  Biopsy of a 12:00 and 4:00 mass in the right breast 01/22/2016 showed ([id number]) both specimens showing invasive ductal carcinoma, grade 1 or 2, both 95% estrogen receptor positive, both 95% progesterone receptor positive, both with strong staining intensity, with MIB-1 ranging from 10-15%, and both HER-2 negative, the signals ratio being 1.23-1.42, and the number per cell 1.85-2.59.  Her subsequent history is as detailed below  INTERVAL HISTORY: [name] returns today for follow-up of her estrogen  receptor positive breast cancer accompanied by her husband. She continues on letrozole, with good tolerance.  Hot flashes and vaginal dryness are not a major issue. She never developed the arthralgias or myalgias that many patients can experience on this medication. She obtains it at a good price.  She is also on palbociclib, at 800 mg a day. Today would be day 1. She has no side effects from that medication that she is aware of but it is causing her some neutropenia.  Since the last visit here we completed her staging studies with a brain MRI on 10/21/2016. This was benign.   REVIEW OF SYSTEMS: They have been no intercurrent infections, no bleeding episodes, also no nausea, vomiting, dizziness, or gait imbalance. She has significant knee problems and she notices this particularly when she stands from a sitting position. She tells me she is now eating a better diet, with more vegetables and less carbohydrates. A detailed review of systems today was otherwise stable  PAST MEDICAL HISTORY: Past Medical History:  Diagnosis Date  . Breast cancer [place] [place])   . Cancer ([place]) 02/2016   right breast  . DIABETES MELLITUS, TYPE II 01/04/2007   only takes actoplus daily  . Dizziness and giddiness 02/29/2008  . DVT, HX OF    at age 71 in right buttocks  . Family history of breast cancer   . GERD 01/04/2007   pt reports resolved   . GLAUCOMA 07/30/2008   both eyes  . History of blood transfusion    no abnormal  reaction  . History of uterine cancer 2000   hysterectomy done  . HYPERLIPIDEMIA 01/04/2007   taking Pravastatin daily  . HYPERTENSION 01/04/2007   takes Lisinopril daily  . Joint pain   . Joint swelling   . LEG  PAIN, LEFT 07/06/2007  . NUMBNESS 07/30/2008   in fingers;pt states from Diamox  . OSTEOARTHRITIS, HIP 09/25/2009  . OTITIS MEDIA, ACUTE, BILATERAL 02/29/2008  . Overweight(278.02) 01/04/2007  . PONV (postoperative nausea and vomiting)   . SLEEP APNEA, OBSTRUCTIVE    doesn't use a  cpap;study done about 59yr ago  . TRANSIENT ISCHEMIC ATTACK, HX OF 01/04/2007  . Vision loss    left eye    PAST SURGICAL HISTORY: Past Surgical History:  Procedure Laterality Date  . ABDOMINAL HYSTERECTOMY  2000  . CHOLECYSTECTOMY    . ENDOBRONCHIAL ULTRASOUND Bilateral 03/28/2016   Procedure: ENDOBRONCHIAL ULTRASOUND;  Surgeon: RCollene Gobble MD;  Location: [facility];  Service: Cardiopulmonary;  Laterality: Bilateral;  . EYE SURGERY  13   shunt left and lazer eye surgery on right cataract and retenia tear with repair  . growth removal  2004   from thumb  . KNEE ARTHROSCOPY Right   . mulitple eye surgeries     both eyes, cataracts with ioc done both eyes  . OOPHORECTOMY    . TOTAL HIP ARTHROPLASTY  06/24/2011   Procedure: TOTAL HIP ARTHROPLASTY;  Surgeon: FKerin Salen  [facility]  Service: Orthopedics;  Laterality: Right;  . TOTAL HIP ARTHROPLASTY Left 11/12/2012   Dr RMayer Camel . TOTAL HIP ARTHROPLASTY Left 11/12/2012   Procedure: TOTAL HIP ARTHROPLASTY;  Surgeon: FKerin Salen MD;  [facility]  Service: Orthopedics;  Laterality: Left;  DEPUY PINNACLE    FAMILY HISTORY Family History  Problem Relation Age of Onset  . Dementia Mother   . Cancer Mother        Breast and lung cancer  . Stroke Sister   . Breast cancer Sister 563 . Heart attack Maternal Aunt   . Lung cancer Maternal Grandmother        non smoker  . Glaucoma Maternal Grandfather   . Anesthesia problems Neg Hx   The patient's father died at age 71 the patient's mother died at age 71 She had breast and lung cancers diagnosed shortly before her death. The patient had no brothers, 2 sisters. One sister was diagnosed with breast cancer at the age of 556  GYNECOLOGIC HISTORY:  No LMP recorded. Patient has had a hysterectomy. Menarche age 71 first live birth age 71 the patient is GX P1. She had a hysterectomy for endometrial cancer in the year 2000. She did not take hormone replacement. She did use  oral contraceptives for your than 20 years remotely, with no complications.  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 She is home with her husband [name] He is a retired mDealerTheir son CJuanda Crumblealso lives in [place]    ADVANCED DIRECTIVES: In place  HEALTH MAINTENANCE: Social History  Substance Use Topics  . Smoking status: Never Smoker  . Smokeless tobacco: Never Used  . Alcohol use No     Colonoscopy: Never  PAP: Status post hysterectomy  Bone density: Remote   Allergies  Allergen Reactions  . Codeine Hives    Hycodan syrup  . Fluorescein Nausea And Vomiting    ? IV dye for retina specialist  . Lipitor [Atorvastatin Calcium]     Leg cramp  . Oxycodone Nausea And Vomiting    Patient vomited for 3 days after taking  . Sitagliptin Phosphate Nausea And Vomiting  . Sulfa Drugs Cross Reactors Nausea And Vomiting  Current Outpatient Prescriptions  Medication Sig Dispense Refill  . acetaminophen (TYLENOL) 500 MG tablet Take 1,000 mg by mouth every 4 (four) hours as needed for moderate pain or fever.    Marland Kitchen aspirin 81 MG tablet Take 81 mg by mouth daily. Reported on 06/12/2015    . bimatoprost (LUMIGAN) 0.01 % SOLN 1 drop into each eye once nightly.    . brimonidine-timolol (COMBIGAN) 0.2-0.5 % ophthalmic solution Place 1 drop into both eyes three times daily    . letrozole (FEMARA) 2.5 MG tablet Take 1 tablet (2.5 mg total) by mouth at bedtime.    Marland Kitchen lisinopril (PRINIVIL,ZESTRIL) 20 MG tablet Take 1 tablet (20 mg total) by mouth daily. 90 tablet 3  . lovastatin (MEVACOR) 20 MG tablet Take 1 tablet (20 mg total) by mouth every evening. 90 tablet 3  . palbociclib (IBRANCE) 100 MG capsule Take 1 capsule (100 mg total) by mouth daily with breakfast. Take whole with food. 21 capsule 6  . pioglitazone-metformin (ACTOPLUS MET) 15-500 MG tablet Take 15-500 tablets by mouth daily. (Patient taking  differently: Take 1 tablet by mouth daily. ) 90 tablet 3  . prochlorperazine (COMPAZINE) 10 MG tablet Take 1 tablet (10 mg total) by mouth every 6 (six) hours as needed for nausea or vomiting. 30 tablet 0  . Vitamin D, Cholecalciferol, 1000 units TABS Take 1,000 Units by mouth every evening.      No current facility-administered medications for this visit.     OBJECTIVE: Middle-aged white womanWho appears stated age  There were no vitals filed for this visit.   There is no height or weight on file to calculate BMI.    ECOG FS:1 - Symptomatic but completely ambulatory There were no vitals filed for this visit.  Sclerae unicteric, EOMs intact Oropharynx clear and moist No cervical or supraclavicular adenopathy Lungs no rales or rhonchi Heart regular rate and rhythm Abd soft, nontender, positive bowel sounds MSK no focal spinal tenderness, no upper extremity lymphedema Neuro: nonfocal, well oriented, appropriate affect Breasts: Deferred    LAB RESULTS:  CMP     Component Value Date/Time   NA 142 11/10/2016 1217   K 4.5 11/10/2016 1217   CL 107 07/23/2016 0550   CO2 28 11/10/2016 1217   GLUCOSE 150 (H) 11/10/2016 1217   BUN 12.3 11/10/2016 1217   CREATININE 0.8 11/10/2016 1217   CALCIUM 9.6 11/10/2016 1217   PROT 6.7 11/10/2016 1217   ALBUMIN 3.9 11/10/2016 1217   AST 14 11/10/2016 1217   ALT 15 11/10/2016 1217   ALKPHOS 58 11/10/2016 1217   BILITOT 0.57 11/10/2016 1217   GFRNONAA >60 07/23/2016 0550   GFRAA >60 07/23/2016 0550    INo results found for: SPEP, UPEP  Lab Results  Component Value Date   WBC 2.5 (L) 12/05/2016   NEUTROABS 1.0 (L) 12/05/2016   HGB 11.3 (L) 12/05/2016   HCT 33.4 (L) 12/05/2016   MCV 93.0 12/05/2016   PLT 158 12/05/2016      Chemistry      Component Value Date/Time   NA 142 11/10/2016 1217   K 4.5 11/10/2016 1217   CL 107 07/23/2016 0550   CO2 28 11/10/2016 1217   BUN 12.3 11/10/2016 1217   CREATININE 0.8 11/10/2016 1217        Component Value Date/Time   CALCIUM 9.6 11/10/2016 1217   ALKPHOS 58 11/10/2016 1217   AST 14 11/10/2016 1217   ALT 15 11/10/2016 1217   BILITOT 0.57 11/10/2016 1217  No results found for: LABCA2  No components found for: [id number]  No results for input(s): INR in the last 168 hours.  Urinalysis    Component Value Date/Time   COLORURINE YELLOW 07/22/2016 1132   APPEARANCEUR HAZY (A) 07/22/2016 1132   LABSPEC 1.013 07/22/2016 1132   PHURINE 5.0 07/22/2016 1132   GLUCOSEU NEGATIVE 07/22/2016 1132   GLUCOSEU NEGATIVE 10/30/2014 1513   HGBUR MODERATE (A) 07/22/2016 1132   BILIRUBINUR NEGATIVE 07/22/2016 1132   KETONESUR 5 (A) 07/22/2016 1132   PROTEINUR NEGATIVE 07/22/2016 1132   UROBILINOGEN 0.2 10/30/2014 1513   NITRITE NEGATIVE 07/22/2016 1132   LEUKOCYTESUR LARGE (A) 07/22/2016 1132     STUDIES: Mr Breast Bilateral W Wo Contrast  Result Date: 12/02/2016 CLINICAL DATA:  Patient is undergoing treatment for biopsy-proven grade 1-2 invasive ductal carcinoma in the 12 o'clock and 4 o'clock locations of the right breast. Family history of breast cancer diagnosed in her mother and sister. LABS:  None EXAM: BILATERAL BREAST MRI WITH AND WITHOUT CONTRAST TECHNIQUE: Multiplanar, multisequence MR images of both breasts were obtained prior to and following the intravenous administration of 20 ml of MultiHance. THREE-DIMENSIONAL MR IMAGE RENDERING ON INDEPENDENT WORKSTATION: Three-dimensional MR images were rendered by post-processing of the original MR data on an independent workstation. The three-dimensional MR images were interpreted, and findings are reported in the following complete MRI report for this study. Three dimensional images were evaluated at the independent DynaCad workstation COMPARISON:  MRI on 02/21/2016 and previous breast imaging studies in biopsies including 10/04/2016 FINDINGS: Breast composition: c. Heterogeneous fibroglandular tissue. Background parenchymal  enhancement: Moderate. Right breast: Numerous residual enhanced masses remain in the upper central portion of the breast. The largest in the 12 o'clock location is associated with a biopsy clip and measures 1.4 x 0.8 x 1.0 cm, previously 2.2 x 1.7 x 1.9 cm. Anterior to this lesion, there are 2 remaining enhancing nodules measuring 0.5 x 0.5 x 0.7 cm and 0.6 x 0.5 x 0.7 cm. Measured anterior to posterior, these 3 nodules span 6.9 cm in the 12 o'clock location. Tissue marker clip is identified in the 4 o'clock location of the left breast following previous biopsy in this location. However no residual non masslike enhancement is identified in this region. No new enhancement identified in the right breast. Left breast: No mass or abnormal enhancement. Lymph nodes: No abnormal appearing lymph nodes. Ancillary findings:  None. IMPRESSION: 1. Significant improvement in the appearance of multiple enhancing nodules in the upper portion of the right breast. 2. No residual non masslike enhancement in the lower inner quadrant of the right breast. 3. Previously enlarged right axillary lymph nodes now appear normal. 4. Left breast remains negative. RECOMMENDATION: Treatment plan is for known right breast malignancy recommended BI-RADS CATEGORY  6: Known biopsy-proven malignancy. Electronically Signed   By: Norva Pavlov M.D.   On: 12/02/2016 11:35     ELIGIBLE FOR AVAILABLE RESEARCH PROTOCOL: no  ASSESSMENT: 71 y.o. [place] woman with a remote history of early stage endometrial cancer, now status post right breast upper outer quadrant biopsy 01/22/2016 for a clinically multifocal T2 N0, stage 2A invasive ductal carcinoma, grade 1, estrogen and progesterone receptor positive, HER-2 negative, with an MIB-1 between 10 and 15%.  (1) right axillary lymph node biopsy 03/02/2016 positive  (2) genetics testing 01/13/2016 through the Custom gene panel offered by [facility] found no deleterious mutations in  ATM, BARD1,  BRCA1, BRCA2, BRIP1, CDH1, CHEK2, EPCAM, FANCC, MLH1, MSH2, MSH6, MUTYH, NBN,  PALB2, PMS2, POLD1, PTEN, RAD51C, RAD51D, TP53, and XRCC2  METASTATIC DISEASE: OCT 2017 (3) CT scans of the chest abdomen and pelvis obtained 03/10/2016 are consistent with bilateral lung metastases and mediastinal and hilar nodal involvement, but no liver or bone spread  (a) bronchoscopic lymph node biopsy 2 (station 7, 13R) 03/28/2016 confirms metastatic adenocarcinoma, estrogen receptor positive, HER-2 not amplified  (b) baseline CA-27-29 on 04/18/2016 was 137.5.  (4) letrozole started 03/15/2016, palbociclib added 03/29/2016 at 125 mg/day, 21/7  (a) dose decreased to 100 mg per day, 21/7, beginning with February cycle  PLAN: Shannon is tolerating her anti-estrogens and cycling inhibitors quite well. They have controlled her measurable disease, which is also shrunk.  I think at this point it may be appropriate to start considering completion local treatment and I'm referring her back to her surgeon Dr. Lucia Gaskins regarding this.  Instead of dropping her palbociclib dose we are going to wait an extra week before she starts her next cycle. With the next cycle however we will probably go to 75 mg daily  She has a good understanding of this plan. She knows to call for any problems that may develop before her next visit.  Chauncey Cruel, MD   12/05/2016 2:25 PM Medical Oncology and Hematology [facility] [address] [place], [place] [address] Tel. [phone]    Fax. [phone]

## 2016-12-05 NOTE — Progress Notes (Signed)
[place]  [phone] [phone]     ID: [name] DOB: Nov 16, 1945  MR#: [id number]  XLK#:[id number]  Patient Care Team: Biagio Borg, MD as PCP - Stevan Born, MD as Consulting Physician (General Surgery) Stephie Xu, Virgie Dad, MD as Consulting Physician (Oncology) Kyung Rudd, MD as Consulting Physician (Radiation Oncology) Bobbye Charleston, MD as Consulting Physician (Obstetrics and Gynecology) Nada Libman, MD as Referring Physician (Specialist) Vevelyn Royals, MD as Consulting Physician (Ophthalmology) Lamonte Sakai Rose Fillers, MD as Consulting Physician (Pulmonary Disease) OTHER MD:  CHIEF COMPLAINT: Estrogen receptor positive breast cancer  CURRENT TREATMENT: Letrozole, palbociclib   BREAST CANCER HISTORY: From the original intake note:  [name] had screening mammography showing some suspicious calcifications in the right breast leading to right diagnostic mammography with ultrasonography 01/22/2016 at [facility]. The breast density was category C. In the upper right breast there was a 2.3 cm mass with additional masses measuring 0.9 and 0.7 cm. There was also a possible additional 0.8 mass in the lower inner quadrant. Ultrasound confirmed an irregular hypoechoic mass in the right breast upper outer quadrant measuring 2.0 cm. There were other masses measuring 0.7 and 0.8 cm by ultrasonography. The right axilla was sonographically benign.  Biopsy of a 12:00 and 4:00 mass in the right breast 01/22/2016 showed ([id number]) both specimens showing invasive ductal carcinoma, grade 1 or 2, both 95% estrogen receptor positive, both 95% progesterone receptor positive, both with strong staining intensity, with MIB-1 ranging from 10-15%, and both HER-2 negative, the signals ratio being 1.23-1.42, and the number per cell 1.85-2.59.  Her subsequent history is as detailed below  INTERVAL HISTORY: [name] returns today for follow-up and treatment of  her estrogen receptor positive stage IV breast cancer accompanied by her husband. [name] continues on letrozole, with no side effects that she is aware of. She feels hot sometimes but doesn't have true hot flashes. Vaginal dryness is not an issue. She has never developed significant arthralgias or myalgias although she did have these at baseline.  She is also on palbociclib, at 100 mg daily 21 days on. She needed a next week between cycles last time to give her counts to recover. She does feel tired with this medication and she takes a nap most mornings between 10 and 11. She then goes to bed at night around midnight. She does not have nausea or other side effects from the [place] that she is aware of. His bite her low counts have been no intercurrent infections.   REVIEW OF SYSTEMS: Her hair is thinning and she has already purchased awake just in case. She wanted to know if she was "still in remission". We discussed this at length today. Otherwise a detailed review of systems today was stable  PAST MEDICAL HISTORY: Past Medical History:  Diagnosis Date  . Breast cancer ([place])   . Cancer ([place]) 02/2016   right breast  . DIABETES MELLITUS, TYPE II 01/04/2007   only takes actoplus daily  . Dizziness and giddiness 02/29/2008  . DVT, HX OF    at age 6 in right buttocks  . Family history of breast cancer   . GERD 01/04/2007   pt reports resolved   . GLAUCOMA 07/30/2008   both eyes  . History of blood transfusion    no abnormal  reaction  . History of uterine cancer 2000   hysterectomy done  . HYPERLIPIDEMIA 01/04/2007   taking Pravastatin daily  . HYPERTENSION 01/04/2007   takes Lisinopril daily  .  Joint pain   . Joint swelling   . LEG PAIN, LEFT 07/06/2007  . NUMBNESS 07/30/2008   in fingers;pt states from Diam[facility]  . OSTEOARTHRITIS, HIP 09/25/2009  . OTITIS MEDIA, ACUTE, BILATERAL 02/29/2008  . Overweight(278.02) 01/04/2007  . PONV (postoperative nausea and vomiting)   . SLEEP APNEA,  OBSTRUCTIVE    doesn't use a cpap;study done about 12yr ago  . TRANSIENT ISCHEMIC ATTACK, HX OF 01/04/2007  . Vision loss    left eye    PAST SURGICAL HISTORY: Past Surgical History:  Procedure Laterality Date  . ABDOMINAL HYSTERECTOMY  2000  . CHOLECYSTECTOMY    . ENDOBRONCHIAL ULTRASOUND Bilateral 03/28/2016   Procedure: ENDOBRONCHIAL ULTRASOUND;  Surgeon: RCollene Gobble MD;  Location: [facility];  Service: Cardiopulmonary;  Laterality: Bilateral;  . EYE SURGERY  13   shunt left and lazer eye surgery on right cataract and retenia tear with repair  . growth removal  2004   from thumb  . KNEE ARTHROSCOPY Right   . mulitple eye surgeries     both eyes, cataracts with ioc done both eyes  . OOPHORECTOMY    . TOTAL HIP ARTHROPLASTY  06/24/2011   Procedure: TOTAL HIP ARTHROPLASTY;  Surgeon: FKerin Salen  [facility]  Service: Orthopedics;  Laterality: Right;  . TOTAL HIP ARTHROPLASTY Left 11/12/2012   Dr RMayer Camel . TOTAL HIP ARTHROPLASTY Left 11/12/2012   Procedure: TOTAL HIP ARTHROPLASTY;  Surgeon: FKerin Salen MD;  [facility]  Service: Orthopedics;  Laterality: Left;  DEPUY PINNACLE    FAMILY HISTORY Family History  Problem Relation Age of Onset  . Dementia Mother   . Cancer Mother        Breast and lung cancer  . Stroke Sister   . Breast cancer Sister 542 . Heart attack Maternal Aunt   . Lung cancer Maternal Grandmother        non smoker  . Glaucoma Maternal Grandfather   . Anesthesia problems Neg Hx   The patient's father died at age 775 the patient's mother died at age 71 She had breast and lung cancers diagnosed shortly before her death. The patient had no brothers, 2 sisters. One sister was diagnosed with breast cancer at the age of 516  GYNECOLOGIC HISTORY:  No LMP recorded. Patient has had a hysterectomy. Menarche age [id number] first live birth age 71 the patient is GX P1. She had a hysterectomy for endometrial cancer in the year 2000. She did not take  hormone replacement. She did use oral contraceptives for your than 20 years remotely, with no complications.  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 She is home with her husband TMarcello Obrien He is a retired mDealerTheir son CJuanda [place]also lives in [place]    ADVANCED DIRECTIVES: In place  HEALTH MAINTENANCE: Social History  Substance Use Topics  . Smoking status: Never Smoker  . Smokeless tobacco: Never Used  . Alcohol use No     Colonoscopy: Never  PAP: Status post hysterectomy  Bone density: Remote   Allergies  Allergen Reactions  . Codeine Hives    Hycodan syrup  . Fluorescein Nausea And Vomiting    ? IV dye for retina specialist  . Lipitor [Atorvastatin Calcium]     Leg cramp  . Oxycodone Nausea And Vomiting    Patient vomited for 3 days after taking  . Sitagliptin Phosphate Nausea And Vomiting  .  Sulfa Drugs Cross Reactors Nausea And Vomiting    Current Outpatient Prescriptions  Medication Sig Dispense Refill  . acetaminophen (TYLENOL) 500 MG tablet Take 1,000 mg by mouth every 4 (four) hours as needed for moderate pain or fever.    Marland Kitchen aspirin 81 MG tablet Take 81 mg by mouth daily. Reported on 06/12/2015    . bimatoprost (LUMIGAN) 0.01 % SOLN 1 drop into each eye once nightly.    . brimonidine-timolol (COMBIGAN) 0.2-0.5 % ophthalmic solution Place 1 drop into both eyes three times daily    . letrozole (FEMARA) 2.5 MG tablet Take 1 tablet (2.5 mg total) by mouth at bedtime.    Marland Kitchen lisinopril (PRINIVIL,ZESTRIL) 20 MG tablet Take 1 tablet (20 mg total) by mouth daily. 90 tablet 3  . lovastatin (MEVACOR) 20 MG tablet Take 1 tablet (20 mg total) by mouth every evening. 90 tablet 3  . palbociclib (IBRANCE) 100 MG capsule Take 1 capsule (100 mg total) by mouth daily with breakfast. Take whole with food. 21 capsule 6  . pioglitazone-metformin (ACTOPLUS MET) 15-500 MG tablet Take 15-500 tablets  by mouth daily. (Patient taking differently: Take 1 tablet by mouth daily. ) 90 tablet 3  . prochlorperazine (COMPAZINE) 10 MG tablet Take 1 tablet (10 mg total) by mouth every 6 (six) hours as needed for nausea or vomiting. 30 tablet 0  . Vitamin D, Cholecalciferol, 1000 units TABS Take 1,000 Units by mouth every evening.      No current facility-administered medications for this visit.      OBJECTIVE: Middle-aged white woman In no acute distress  Vitals:   12/05/16 1423  BP: (!) 180/81  Pulse: 64  Resp: 18  Temp: 98.1 F (36.7 C)     Body mass index is 41.09 kg/m.    ECOG FS:1 - Symptomatic but completely ambulatory Filed Weights   12/05/16 1423  Weight: 239 lb 6.4 oz (108.6 kg)    Sclerae unicteric, pupils round and equal Oropharynx clear and moist No cervical or supraclavicular adenopathy Lungs no rales or rhonchi Heart regular rate and rhythm Abd soft, obese nontender, positive bowel sounds MSK no focal spinal tenderness, no upper extremity lymphedema Neuro: nonfocal, well oriented, appropriate affect Breasts: Deferred   LAB RESULTS:  CMP     Component Value Date/Time   NA 142 11/10/2016 1217   K 4.5 11/10/2016 1217   CL 107 07/23/2016 0550   CO2 28 11/10/2016 1217   GLUCOSE 150 (H) 11/10/2016 1217   BUN 12.3 11/10/2016 1217   CREATININE 0.8 11/10/2016 1217   CALCIUM 9.6 11/10/2016 1217   PROT 6.7 11/10/2016 1217   ALBUMIN 3.9 11/10/2016 1217   AST 14 11/10/2016 1217   ALT 15 11/10/2016 1217   ALKPHOS 58 11/10/2016 1217   BILITOT 0.57 11/10/2016 1217   GFRNONAA >60 07/23/2016 0550   GFRAA >60 07/23/2016 0550    INo results found for: SPEP, UPEP  Lab Results  Component Value Date   WBC 2.5 (L) 12/05/2016   NEUTROABS 1.0 (L) 12/05/2016   HGB 11.3 (L) 12/05/2016   HCT 33.4 (L) 12/05/2016   MCV 93.0 12/05/2016   PLT 158 12/05/2016      Chemistry      Component Value Date/Time   NA 142 11/10/2016 1217   K 4.5 11/10/2016 1217   CL 107  07/23/2016 0550   CO2 28 11/10/2016 1217   BUN 12.3 11/10/2016 1217   CREATININE 0.8 11/10/2016 1217      Component Value  Date/Time   CALCIUM 9.6 11/10/2016 1217   ALKPHOS 58 11/10/2016 1217   AST 14 11/10/2016 1217   ALT 15 11/10/2016 1217   BILITOT 0.57 11/10/2016 1217       No results found for: LABCA2  No components found for: LABCA125  No results for input(s): INR in the last 168 hours.  Urinalysis    Component Value Date/Time   COLORURINE YELLOW 07/22/2016 1132   APPEARANCEUR HAZY (A) 07/22/2016 1132   LABSPEC 1.013 07/22/2016 1132   PHURINE 5.0 07/22/2016 1132   GLUCOSEU NEGATIVE 07/22/2016 1132   GLUCOSEU NEGATIVE 10/30/2014 1513   HGBUR MODERATE (A) 07/22/2016 1132   BILIRUBINUR NEGATIVE 07/22/2016 1132   KETONESUR 5 (A) 07/22/2016 1132   PROTEINUR NEGATIVE 07/22/2016 1132   UROBILINOGEN 0.2 10/30/2014 1513   NITRITE NEGATIVE 07/22/2016 1132   LEUKOCYTESUR LARGE (A) 07/22/2016 1132     STUDIES: Mr Breast Bilateral W Wo Contrast  Result Date: 12/02/2016 CLINICAL DATA:  Patient is undergoing treatment for biopsy-proven grade 1-2 invasive ductal carcinoma in the 12 o'clock and 4 o'clock locations of the right breast. Family history of breast cancer diagnosed in her mother and sister. LABS:  None EXAM: BILATERAL BREAST MRI WITH AND WITHOUT CONTRAST TECHNIQUE: Multiplanar, multisequence MR images of both breasts were obtained prior to and following the intravenous administration of 20 ml of MultiHance. THREE-DIMENSIONAL MR IMAGE RENDERING ON INDEPENDENT WORKSTATION: Three-dimensional MR images were rendered by post-processing of the original MR data on an independent workstation. The three-dimensional MR images were interpreted, and findings are reported in the following complete MRI report for this study. Three dimensional images were evaluated at the independent DynaCad workstation COMPARISON:  MRI on 02/21/2016 and previous breast imaging studies in biopsies  including 10/04/2016 FINDINGS: Breast composition: c. Heterogeneous fibroglandular tissue. Background parenchymal enhancement: Moderate. Right breast: Numerous residual enhanced masses remain in the upper central portion of the breast. The largest in the 12 o'clock location is associated with a biopsy clip and measures 1.4 x 0.8 x 1.0 cm, previously 2.2 x 1.7 x 1.9 cm. Anterior to this lesion, there are 2 remaining enhancing nodules measuring 0.5 x 0.5 x 0.7 cm and 0.6 x 0.5 x 0.7 cm. Measured anterior to posterior, these 3 nodules span 6.9 cm in the 12 o'clock location. Tissue marker clip is identified in the 4 o'clock location of the left breast following previous biopsy in this location. However no residual non masslike enhancement is identified in this region. No new enhancement identified in the right breast. Left breast: No mass or abnormal enhancement. Lymph nodes: No abnormal appearing lymph nodes. Ancillary findings:  None. IMPRESSION: 1. Significant improvement in the appearance of multiple enhancing nodules in the upper portion of the right breast. 2. No residual non masslike enhancement in the lower inner quadrant of the right breast. 3. Previously enlarged right axillary lymph nodes now appear normal. 4. Left breast remains negative. RECOMMENDATION: Treatment plan is for known right breast malignancy recommended BI-RADS CATEGORY  6: Known biopsy-proven malignancy. Electronically Signed   By: Nolon Nations M.D.   On: 12/02/2016 11:35     ELIGIBLE FOR AVAILABLE RESEARCH PROTOCOL: no  ASSESSMENT: 71 y.o. [place] woman with a remote history of early stage endometrial cancer, now status post right breast upper outer quadrant biopsy 01/22/2016 for a clinically multifocal T2 N0, stage 2A invasive ductal carcinoma, grade 1, estrogen and progesterone receptor positive, HER-2 negative, with an MIB-1 between 10 and 15%.  (1) right axillary lymph node  biopsy 03/02/2016 positive  (2) genetics  testing 01/13/2016 through the Custom gene panel offered by [facility] found no deleterious mutations in  ATM, BARD1, BRCA1, BRCA2, BRIP1, CDH1, CHEK2, EPCAM, FANCC, MLH1, MSH2, MSH6, MUTYH, NBN, PALB2, PMS2, POLD1, PTEN, RAD51C, RAD51D, TP53, and XRCC2  METASTATIC DISEASE: OCT 2017 (3) CT scans of the chest abdomen and pelvis obtained 03/10/2016 are consistent with bilateral lung metastases and mediastinal and hilar nodal involvement, but no liver or bone spread  (a) bronchoscopic lymph node biopsy 2 (station 7, 13R) 03/28/2016 confirms metastatic adenocarcinoma, estrogen receptor positive, HER-2 not amplified  (b) baseline CA-27-29 on 04/18/2016 was 137.5.  (4) letrozole started 03/15/2016, palbociclib added 03/29/2016 at 125 mg/day, 21/7  (a) dose decreased to 100 mg per day, 21/7, beginning with February cycle  PLAN: Zamira is having a very good reaction to anti-estrogens and the breast MRI results are very favorable. If her surgeon Dr. Lucia Gaskins feels lumpectomy is possible at present then we will proceed with that and follow-up with adjuvant radiation. Otherwise we will continue the anti-estrogens until surgery seems feasible.  In any case, since we are hoping for long-term survival, and the whole point of the surgery is to prevent uncontrolled local disease, I would favor adjuvant radiation in this case, which is the current plan  Her blood counts remain borderline. However she is tolerating the palbociclib well otherwise. We are going to have a two-week break between cycles as we did last time, so she will receive 21 days of treatment followed by 2 weeks off. At some point very likely we will have to drop the dose to 75 mg and which time we will go back to the 27/7 schedule which is more routine.  She thougyt I had said she was in remission. I do not believe I said that but if I did I certainly did not mean to. We do not use remission as an expression in stage IV disease. What I did say is that her  tumor marker had normalized and that certainly is the case. It is also the case that her lung lesions are few small and smaller than they were initially--they were last imaged April 2018.  She is very pleased with her results. She is going to return to see [place] again in approximately 6 weeks. She knows to call for any problems that may develop before her next visit here.  Chauncey Cruel, MD   12/05/2016 2:37 PM Medical Oncology and Hematology [facility] [address] [place], [place] [address] Tel. [phone]    Fax. [phone]

## 2016-12-05 NOTE — Addendum Note (Signed)
Addended by: Chauncey Cruel on: 12/05/2016 05:52 PM   Modules accepted: Orders

## 2016-12-06 ENCOUNTER — Other Ambulatory Visit: Payer: Self-pay | Admitting: *Deleted

## 2016-12-06 LAB — CANCER ANTIGEN 27.29: CA 27.29: 23.6 U/mL (ref 0.0–38.6)

## 2016-12-06 MED ORDER — PALBOCICLIB 100 MG PO CAPS: 100.0000 mg | ORAL_CAPSULE | Freq: Every day | ORAL | 6 refills | Status: DC

## 2016-12-06 MED FILL — IBRANCE 100 MG CAPSULE: 100 | 21 days supply | Qty: 21 | Fill #0

## 2016-12-15 NOTE — Progress Notes (Signed)
Location of Breast Cancer:Upper-outer quadrant of right female breast  Histology per Pathology Report:  Diagnosis 03-02-16 Lymph node, needle/core biopsy, right axillary - LYMPH NODE TISSUE WITH METASTATIC CARCINOMA  Receptor Status: ER(95% +), PR (95% +), Her2-neu (neg), Ki-(10%)  Diagnosis 01-22-16 1. Breast, right, needle core biopsy, 12:00 o'clock - INVASIVE DUCTAL CARCINOMA, SEE COMMENT. 2. Breast, right, needle core biopsy, 4:00 o'clock - INVASIVE DUCTAL CARCINOMA, SEE COMMENT   Did patient present with symptoms (if so, please note symptoms) or was this found on screening mammography?: screening mammogram   Past/Anticipated interventions by surgeon, if FQH:KUVJD Newman,M.D referral montoring  Past/Anticipated interventions by medical oncology, if any: Chemotherapy palbociclib  at 100 mg daily 21 days on 14 days off, Ibrance and Letrozole daily,03-02-17 Lymph node Right axilla, needle/core biopsy, right axillary,01-22-16, 08-02-17genetics testing   Lymphedema issues, if any:No  Pain issues, if any:1/10 tooth ache going to dentist tiomorrow  SAFETY ISSUES:  Prior radiation?:No  Pacemaker/ICD? :No  Possible current pregnancy?:No  Is the patient on methotrexate? : No Menarche age 63, first live birth age 4, the patient is G1 P1. She had a hysterectomy for endometrial cancer in the year 2000. She did not take hormone replacement. She did use oral contraceptives for more than 20 years remotely, with no complications.  Mother had breast cancer and lung cancer,sister breast cancer  Current Complaints / other details: 71 y.o. married retired woman lives with her husband.    Wt Readings from Last 3 Encounters:  12/21/16 239 lb (108.4 kg)  12/05/16 239 lb 6.4 oz (108.6 kg)  11/17/16 236 lb (107 kg)  BP (!) 160/85   Pulse 60   Temp 97.8 F (36.6 C) (Oral)   Resp 18   Ht '5\' 4"'$  (1.626 m)   Wt 239 lb (108.4 kg)   SpO2 100%   BMI 41.02 kg/m  Georgena Spurling,  RN 12/15/2016,12:30 PM

## 2016-12-20 ENCOUNTER — Encounter: Payer: Self-pay | Admitting: Radiation Oncology

## 2016-12-21 ENCOUNTER — Ambulatory Visit
Admission: RE | Admit: 2016-12-21 | Discharge: 2016-12-21 | Disposition: A | Discharge status: Home / Self Care | Payer: Medicare Other | Source: Ambulatory Visit | Attending: Radiation Oncology | Admitting: Radiation Oncology

## 2016-12-21 ENCOUNTER — Encounter: Payer: Self-pay | Admitting: Radiation Oncology

## 2016-12-21 VITALS — BP 160/85 | HR 60 | Temp 97.8°F | Resp 18 | Ht 64.0 in | Wt 239.0 lb

## 2016-12-21 DIAGNOSIS — Z86718 Personal history of other venous thrombosis and embolism: Secondary | ICD-10-CM | POA: Diagnosis not present

## 2016-12-21 DIAGNOSIS — Z79899 Other long term (current) drug therapy: Secondary | ICD-10-CM | POA: Insufficient documentation

## 2016-12-21 DIAGNOSIS — C50411 Malignant neoplasm of upper-outer quadrant of right female breast: Secondary | ICD-10-CM

## 2016-12-21 DIAGNOSIS — Z8249 Family history of ischemic heart disease and other diseases of the circulatory system: Secondary | ICD-10-CM | POA: Insufficient documentation

## 2016-12-21 DIAGNOSIS — Z170 Estrogen receptor positive status [ER+]: Secondary | ICD-10-CM | POA: Diagnosis not present

## 2016-12-21 DIAGNOSIS — Z90710 Acquired absence of both cervix and uterus: Secondary | ICD-10-CM | POA: Insufficient documentation

## 2016-12-21 DIAGNOSIS — Z888 Allergy status to other drugs, medicaments and biological substances status: Secondary | ICD-10-CM | POA: Diagnosis not present

## 2016-12-21 DIAGNOSIS — Z801 Family history of malignant neoplasm of trachea, bronchus and lung: Secondary | ICD-10-CM | POA: Insufficient documentation

## 2016-12-21 DIAGNOSIS — Z9049 Acquired absence of other specified parts of digestive tract: Secondary | ICD-10-CM | POA: Insufficient documentation

## 2016-12-21 DIAGNOSIS — I10 Essential (primary) hypertension: Secondary | ICD-10-CM | POA: Insufficient documentation

## 2016-12-21 DIAGNOSIS — E119 Type 2 diabetes mellitus without complications: Secondary | ICD-10-CM | POA: Insufficient documentation

## 2016-12-21 DIAGNOSIS — Z882 Allergy status to sulfonamides status: Secondary | ICD-10-CM | POA: Diagnosis not present

## 2016-12-21 DIAGNOSIS — Z885 Allergy status to narcotic agent status: Secondary | ICD-10-CM | POA: Insufficient documentation

## 2016-12-21 DIAGNOSIS — Z7982 Long term (current) use of aspirin: Secondary | ICD-10-CM | POA: Insufficient documentation

## 2016-12-21 DIAGNOSIS — M1610 Unilateral primary osteoarthritis, unspecified hip: Secondary | ICD-10-CM | POA: Insufficient documentation

## 2016-12-21 DIAGNOSIS — C781 Secondary malignant neoplasm of mediastinum: Secondary | ICD-10-CM | POA: Diagnosis not present

## 2016-12-21 DIAGNOSIS — G4733 Obstructive sleep apnea (adult) (pediatric): Secondary | ICD-10-CM | POA: Insufficient documentation

## 2016-12-21 DIAGNOSIS — Z96643 Presence of artificial hip joint, bilateral: Secondary | ICD-10-CM | POA: Insufficient documentation

## 2016-12-21 DIAGNOSIS — C7800 Secondary malignant neoplasm of unspecified lung: Secondary | ICD-10-CM | POA: Insufficient documentation

## 2016-12-21 DIAGNOSIS — Z823 Family history of stroke: Secondary | ICD-10-CM | POA: Insufficient documentation

## 2016-12-21 DIAGNOSIS — Z9221 Personal history of antineoplastic chemotherapy: Secondary | ICD-10-CM | POA: Insufficient documentation

## 2016-12-21 DIAGNOSIS — Z803 Family history of malignant neoplasm of breast: Secondary | ICD-10-CM | POA: Diagnosis not present

## 2016-12-21 DIAGNOSIS — Z820 Family history of epilepsy and other diseases of the nervous system: Secondary | ICD-10-CM | POA: Insufficient documentation

## 2016-12-21 DIAGNOSIS — Z9889 Other specified postprocedural states: Secondary | ICD-10-CM | POA: Diagnosis not present

## 2016-12-21 DIAGNOSIS — C773 Secondary and unspecified malignant neoplasm of axilla and upper limb lymph nodes: Secondary | ICD-10-CM | POA: Diagnosis not present

## 2016-12-22 NOTE — Progress Notes (Signed)
Radiation Oncology         [phone] ________________________________  Name: [name] MRN: [id number]  Date: 12/21/2016  DOB: 1946/02/05  EL:FYBO, Hunt Oris, MD  Alphonsa Overall, MD     REFERRING PHYSICIAN: Alphonsa Overall, MD   DIAGNOSIS: The encounter diagnosis was Malignant neoplasm of upper-outer quadrant of right breast in female, estrogen receptor positive ([place]).   HISTORY OF PRESENT ILLNESS: [name] is a 71 y.o. female originally seen in August 2017 in the multidiscplinary breast clinic for a new diagnosis of multifocal breast cancer. She was found to have distortion on her screening mammogram and diagnostic imaging was performed as well as biopsy on 01/22/16. Findings in the right breast revealed a 2 x 1.5 x 1.6 cm mass at 12 o'clock, as well as a second mass in the same location measuring 8 x 7 x 8 mm, and a 8 mm lesion at 4 o'clock. The dominant mass and the 4 o'clock mass were biopsied and both showed ER/PR positive, HER 2 negative invasive ductal carcinoma. She underwent breast MRI in September 2017 which revealed multifocal disease, and a 1.7 cm area of adenopathy in the right axilla. She subsequently underwent biopsy of this on 03/02/16 which revealed metastatic carcinoma consistent with her breast disease. Additional staging work up revealed widespread pulmonary metastasis and nodal disease in the mediastinum. She completed chemotherapy and had an excellent response with no persistent pulmonary or nodal disease on subsequent imaging. She is now proceeding with local treatment and will be undergoing surgical resection. She comes today to discuss the options of postop radiotherapy.    PREVIOUS RADIATION THERAPY: No   PAST MEDICAL HISTORY:  Past Medical History:  Diagnosis Date  . Breast cancer ([place])   . Cancer ([place]) 02/2016   right breast  . DIABETES MELLITUS, TYPE II 01/04/2007   only takes actoplus daily  . Dizziness and giddiness 02/29/2008  . DVT, HX OF    at age 84  in right buttocks  . Family history of breast cancer   . GERD 01/04/2007   pt reports resolved   . GLAUCOMA 07/30/2008   both eyes  . History of blood transfusion    no abnormal  reaction  . History of uterine cancer 2000   hysterectomy done  . HYPERLIPIDEMIA 01/04/2007   taking Pravastatin daily  . HYPERTENSION 01/04/2007   takes Lisinopril daily  . Joint pain   . Joint swelling   . LEG PAIN, LEFT 07/06/2007  . NUMBNESS 07/30/2008   in fingers;pt states from Diamox  . OSTEOARTHRITIS, HIP 09/25/2009  . OTITIS MEDIA, ACUTE, BILATERAL 02/29/2008  . Overweight(278.02) 01/04/2007  . PONV (postoperative nausea and vomiting)   . SLEEP APNEA, OBSTRUCTIVE    doesn't use a cpap;study done about 80yr ago  . TRANSIENT ISCHEMIC ATTACK, HX OF 01/04/2007  . Vision loss    left eye       PAST SURGICAL HISTORY: Past Surgical History:  Procedure Laterality Date  . ABDOMINAL HYSTERECTOMY  2000  . CHOLECYSTECTOMY    . ENDOBRONCHIAL ULTRASOUND Bilateral 03/28/2016   Procedure: ENDOBRONCHIAL ULTRASOUND;  Surgeon: RCollene Gobble MD;  Location: [facility];  Service: Cardiopulmonary;  Laterality: Bilateral;  . EYE SURGERY  13   shunt left and lazer eye surgery on right cataract and retenia tear with repair  . growth removal  2004   from thumb  . KNEE ARTHROSCOPY Right   . mulitple eye surgeries     both eyes, cataracts with  ioc done both eyes  . OOPHORECTOMY    . TOTAL HIP ARTHROPLASTY  06/24/2011   Procedure: TOTAL HIP ARTHROPLASTY;  Surgeon: Kerin Salen;  Location: [facility];  Service: Orthopedics;  Laterality: Right;  . TOTAL HIP ARTHROPLASTY Left 11/12/2012   Dr Mayer Camel  . TOTAL HIP ARTHROPLASTY Left 11/12/2012   Procedure: TOTAL HIP ARTHROPLASTY;  Surgeon: Kerin Salen, MD;  Location: [facility];  Service: Orthopedics;  Laterality: Left;  DEPUY PINNACLE     FAMILY HISTORY:  Family History  Problem Relation Age of Onset  . Dementia Mother   . Cancer Mother        Breast and lung cancer  .  Stroke Sister   . Breast cancer Sister 8  . Heart attack Maternal Aunt   . Lung cancer Maternal Grandmother        non smoker  . Glaucoma Maternal Grandfather   . Anesthesia problems Neg Hx      SOCIAL HISTORY:  reports that she has never smoked. She has never used smokeless tobacco. She reports that she does not drink alcohol or use drugs. She is married and resides in [place]. She is retired and spends much of her time with a young granddaughter.   ALLERGIES: Codeine; Fluorescein; Lipitor [atorvastatin calcium]; Oxycodone; Sitagliptin phosphate; and Sulfa drugs cross reactors   MEDICATIONS:  Current Outpatient Prescriptions  Medication Sig Dispense Refill  . acetaminophen (TYLENOL) 500 MG tablet Take 1,000 mg by mouth every 4 (four) hours as needed for moderate pain or fever.    Marland Kitchen aspirin 81 MG tablet Take 81 mg by mouth daily. Reported on 06/12/2015    . bimatoprost (LUMIGAN) 0.01 % SOLN 1 drop into each eye once nightly.    . brimonidine-timolol (COMBIGAN) 0.2-0.5 % ophthalmic solution Place 1 drop into both eyes three times daily    . letrozole (FEMARA) 2.5 MG tablet Take 1 tablet (2.5 mg total) by mouth at bedtime.    Marland Kitchen lisinopril (PRINIVIL,ZESTRIL) 20 MG tablet Take 1 tablet (20 mg total) by mouth daily. 90 tablet 3  . lovastatin (MEVACOR) 20 MG tablet Take 1 tablet (20 mg total) by mouth every evening. 90 tablet 3  . palbociclib (IBRANCE) 100 MG capsule Take 1 capsule (100 mg total) by mouth daily with breakfast. Take whole with food. 21 capsule 6  . pioglitazone-metformin (ACTOPLUS MET) 15-500 MG tablet Take 15-500 tablets by mouth daily. (Patient taking differently: Take 1 tablet by mouth daily. ) 90 tablet 3  . Vitamin D, Cholecalciferol, 1000 units TABS Take 1,000 Units by mouth every evening.     . prochlorperazine (COMPAZINE) 10 MG tablet Take 1 tablet (10 mg total) by mouth every 6 (six) hours as needed for nausea or vomiting. (Patient not taking: Reported on  12/21/2016) 30 tablet 0   No current facility-administered medications for this encounter.      REVIEW OF SYSTEMS: On review of systems, the patient reports that she is doing well overall since chemotherapy and is currently on Ibrance. She reports fatigue as a result of her regimens.  She denies any chest pain, shortness of breath, cough, fevers, chills, night sweats, unintended weight changes. She denies any bowel or bladder disturbances, and denies abdominal pain, nausea or vomiting. She denies any new musculoskeletal or joint aches or pains. A complete review of systems is obtained and is otherwise negative.     PHYSICAL EXAM: Wt Readings from Last 3 Encounters:  12/21/16 239 lb (108.4 kg)  12/05/16 239  lb 6.4 oz (108.6 kg)  11/17/16 236 lb (107 kg)   Temp Readings from Last 3 Encounters:  12/21/16 97.8 F (36.6 C) (Oral)  12/05/16 98.1 F (36.7 C) (Oral)  11/10/16 98.2 F (36.8 C) (Oral)   BP Readings from Last 3 Encounters:  12/21/16 (!) 160/85  12/05/16 (!) 180/81  11/17/16 136/84   Pulse Readings from Last 3 Encounters:  12/21/16 60  12/05/16 64  11/17/16 67    Pain scale 0/10 In general this is a well appearing Caucasian female in no acute distress. She's alert and oriented x4 and appropriate throughout the examination. Cardiopulmonary assessment is negative for acute distress and She exhibits normal effort. Breast exam is deferred at this time.    ECOG = 0  0 - Asymptomatic (Fully active, able to carry on all predisease activities without restriction)  1 - Symptomatic but completely ambulatory (Restricted in physically strenuous activity but ambulatory and able to carry out work of a light or sedentary nature. For example, light housework, office work)  2 - Symptomatic, <50% in bed during the day (Ambulatory and capable of all self care but unable to carry out any work activities. Up and about more than 50% of waking hours)  3 - Symptomatic, >50% in bed, but  not bedbound (Capable of only limited self-care, confined to bed or chair 50% or more of waking hours)  4 - Bedbound (Completely disabled. Cannot carry on any self-care. Totally confined to bed or chair)  5 - Death   Eustace Pen MM, Creech RH, Tormey DC, et al. [phone]). "Toxicity and response criteria of the [facility]". [place] Oncol. 5 (6): 649-55    LABORATORY DATA:  Lab Results  Component Value Date   WBC 2.5 (L) 12/05/2016   HGB 11.3 (L) 12/05/2016   HCT 33.4 (L) 12/05/2016   MCV 93.0 12/05/2016   PLT 158 12/05/2016   Lab Results  Component Value Date   NA 142 12/05/2016   K 4.2 12/05/2016   CL 107 07/23/2016   CO2 27 12/05/2016   Lab Results  Component Value Date   ALT 12 12/05/2016   AST 10 12/05/2016   ALKPHOS 58 12/05/2016   BILITOT 0.45 12/05/2016      RADIOGRAPHY: Mr Breast Bilateral W Wo Contrast  Result Date: 12/02/2016 CLINICAL DATA:  Patient is undergoing treatment for biopsy-proven grade 1-2 invasive ductal carcinoma in the 12 o'clock and 4 o'clock locations of the right breast. Family history of breast cancer diagnosed in her mother and sister. LABS:  None EXAM: BILATERAL BREAST MRI WITH AND WITHOUT CONTRAST TECHNIQUE: Multiplanar, multisequence MR images of both breasts were obtained prior to and following the intravenous administration of 20 ml of MultiHance. THREE-DIMENSIONAL MR IMAGE RENDERING ON INDEPENDENT WORKSTATION: Three-dimensional MR images were rendered by post-processing of the original MR data on an independent workstation. The three-dimensional MR images were interpreted, and findings are reported in the following complete MRI report for this study. Three dimensional images were evaluated at the independent DynaCad workstation COMPARISON:  MRI on 02/21/2016 and previous breast imaging studies in biopsies including 10/04/2016 FINDINGS: Breast composition: c. Heterogeneous fibroglandular tissue. Background parenchymal  enhancement: Moderate. Right breast: Numerous residual enhanced masses remain in the upper central portion of the breast. The largest in the 12 o'clock location is associated with a biopsy clip and measures 1.4 x 0.8 x 1.0 cm, previously 2.2 x 1.7 x 1.9 cm. Anterior to this lesion, there are 2 remaining enhancing nodules  measuring 0.5 x 0.5 x 0.7 cm and 0.6 x 0.5 x 0.7 cm. Measured anterior to posterior, these 3 nodules span 6.9 cm in the 12 o'clock location. Tissue marker clip is identified in the 4 o'clock location of the left breast following previous biopsy in this location. However no residual non masslike enhancement is identified in this region. No new enhancement identified in the right breast. Left breast: No mass or abnormal enhancement. Lymph nodes: No abnormal appearing lymph nodes. Ancillary findings:  None. IMPRESSION: 1. Significant improvement in the appearance of multiple enhancing nodules in the upper portion of the right breast. 2. No residual non masslike enhancement in the lower inner quadrant of the right breast. 3. Previously enlarged right axillary lymph nodes now appear normal. 4. Left breast remains negative. RECOMMENDATION: Treatment plan is for known right breast malignancy recommended BI-RADS CATEGORY  6: Known biopsy-proven malignancy. Electronically Signed   By: Nolon Nations M.D.   On: 12/02/2016 11:35       IMPRESSION/PLAN: 1. Metastatic, ER/PR positive invasive ductal carcinoma of the right breast. Dr. Lisbeth Renshaw discusses the pathology findings and reviews the nature of invasive breast disease. We reviewed all that has occurred since her original diagnosis and the identification of metastatic disease in the lungs. She has completed systemic therapy and this has improved her nodal and pulmonary disease greatly. At this point in time, the goals are to move forward with local treatment. She is in the process of being set up for lumpectomy possible mastectomy and axillary  dissection. The surgery is somewhat delayed due to an upcoming eye surgery. Dr. Lisbeth Renshaw reviews that we would recommend radiotherapy, and that following surgery, her course would be followed by external radiotherapy to the breast followed by antiestrogen therapy. We discussed the risks, benefits, short, and long term effects of radiotherapy, and the patient is interested in proceeding. Dr. Lisbeth Renshaw discusses the delivery and logistics of radiotherapy an would offer her 6 1/2 weeks of radiotherapy to either the breast/chestwall depending on her surgery, and of the regional nodes. We will see her back about 2 weeks after surgery to move forward with the simulation and planning process and anticipate starting radiotherapy about 4 weeks after surgery.   In a visit lasting 45 minutes, greater than 50% of the time was spent face to face discussing options of radiotherapy including side effects, and coordinating the patient's care.   The above documentation reflects my direct findings during this shared patient visit. Please see the separate note by Dr. Lisbeth Renshaw on this date for the remainder of the patient's plan of care.    Carola Rhine, PAC

## 2016-12-23 ENCOUNTER — Telehealth: Payer: Self-pay | Admitting: *Deleted

## 2016-12-23 NOTE — Telephone Encounter (Signed)
This RN spoke with pt per call stating upcoming surgeries including, eye, dental extraction

## 2016-12-23 NOTE — Addendum Note (Signed)
Encounter addended by: Malena Edman, RN on: 12/23/2016  3:50 PM<BR>    Actions taken: Order Reconciliation Section accessed, Home Medications modified

## 2016-12-27 DIAGNOSIS — E119 Type 2 diabetes mellitus without complications: Secondary | ICD-10-CM | POA: Diagnosis not present

## 2016-12-27 DIAGNOSIS — Z888 Allergy status to other drugs, medicaments and biological substances status: Secondary | ICD-10-CM | POA: Diagnosis not present

## 2016-12-27 DIAGNOSIS — Z853 Personal history of malignant neoplasm of breast: Secondary | ICD-10-CM | POA: Diagnosis not present

## 2016-12-27 DIAGNOSIS — Z794 Long term (current) use of insulin: Secondary | ICD-10-CM | POA: Diagnosis not present

## 2016-12-27 DIAGNOSIS — Z7982 Long term (current) use of aspirin: Secondary | ICD-10-CM | POA: Diagnosis not present

## 2016-12-27 DIAGNOSIS — Z6841 Body Mass Index (BMI) 40.0 and over, adult: Secondary | ICD-10-CM | POA: Diagnosis not present

## 2016-12-27 DIAGNOSIS — I10 Essential (primary) hypertension: Secondary | ICD-10-CM | POA: Diagnosis not present

## 2016-12-27 DIAGNOSIS — G4733 Obstructive sleep apnea (adult) (pediatric): Secondary | ICD-10-CM | POA: Diagnosis not present

## 2016-12-27 DIAGNOSIS — T85318A Breakdown (mechanical) of other ocular prosthetic devices, implants and grafts, initial encounter: Secondary | ICD-10-CM | POA: Diagnosis not present

## 2016-12-27 DIAGNOSIS — E785 Hyperlipidemia, unspecified: Secondary | ICD-10-CM | POA: Diagnosis not present

## 2016-12-27 DIAGNOSIS — Z86718 Personal history of other venous thrombosis and embolism: Secondary | ICD-10-CM | POA: Diagnosis not present

## 2016-12-27 DIAGNOSIS — Z96643 Presence of artificial hip joint, bilateral: Secondary | ICD-10-CM | POA: Diagnosis not present

## 2016-12-27 DIAGNOSIS — H401114 Primary open-angle glaucoma, right eye, indeterminate stage: Secondary | ICD-10-CM | POA: Diagnosis not present

## 2016-12-27 DIAGNOSIS — Z885 Allergy status to narcotic agent status: Secondary | ICD-10-CM | POA: Diagnosis not present

## 2016-12-27 DIAGNOSIS — H401133 Primary open-angle glaucoma, bilateral, severe stage: Secondary | ICD-10-CM | POA: Diagnosis not present

## 2016-12-27 DIAGNOSIS — T85398A Other mechanical complication of other ocular prosthetic devices, implants and grafts, initial encounter: Secondary | ICD-10-CM | POA: Diagnosis not present

## 2016-12-27 DIAGNOSIS — G4730 Sleep apnea, unspecified: Secondary | ICD-10-CM | POA: Diagnosis not present

## 2017-01-02 NOTE — Progress Notes (Signed)
[place]  [phone] [phone]) [phone]     ID: TERAN DAUGHENBAUGH DOB: Apr 30, 1946  MR#: [id number]  QQP#:[id number]  Patient Care Team: Biagio Borg, MD as PCP - Stevan Born, MD as Consulting Physician (General Surgery) Magrinat, Virgie Dad, MD as Consulting Physician (Oncology) Kyung Rudd, MD as Consulting Physician (Radiation Oncology) Bobbye Charleston, MD as Consulting Physician (Obstetrics and Gynecology) Nada Libman, MD as Referring Physician (Specialist) Vevelyn Royals, MD as Consulting Physician (Ophthalmology) Lamonte Sakai Rose Fillers, MD as Consulting Physician (Pulmonary Disease) OTHER MD:  CHIEF COMPLAINT: Estrogen receptor positive breast cancer  CURRENT TREATMENT: Letrozole, palbociclib   BREAST CANCER HISTORY: From the original intake note:  Nathania had screening mammography showing some suspicious calcifications in the right breast leading to right diagnostic mammography with ultrasonography 01/22/2016 at [facility]. The breast density was category C. In the upper right breast there was a 2.3 cm mass with additional masses measuring 0.9 and 0.7 cm. There was also a possible additional 0.8 mass in the lower inner quadrant. Ultrasound confirmed an irregular hypoechoic mass in the right breast upper outer quadrant measuring 2.0 cm. There were other masses measuring 0.7 and 0.8 cm by ultrasonography. The right axilla was sonographically benign.  Biopsy of a 12:00 and 4:00 mass in the right breast 01/22/2016 showed ([id number]) both specimens showing invasive ductal carcinoma, grade 1 or 2, both 95% estrogen receptor positive, both 95% progesterone receptor positive, both with strong staining intensity, with MIB-1 ranging from 10-15%, and both HER-2 negative, the signals ratio being 1.23-1.42, and the number per cell 1.85-2.59.  Her subsequent history is as detailed below  INTERVAL HISTORY: Khristy returns today for follow-up and treatment of  her metastatic estrogen receptor positive breast cancer. Since her last visit here she had bilateral breast MRIs, which showed significant improvement in the nodules in the upper portion of the right breast. There was no residual non-masslike enhancement in the lower inner quadrant and the previously enlarged right axillary lymph nodes now appeared normal. Left breast was fine.  I have discussed the results with Dr. Lucia Gaskins. It is his feeling that very likely a double lumpectomy could be performed successfully with sentinel lymph node sampling. This would be followed by radiation. Charlcie is very much in agreement with this.  In preparation for the upcoming surgery we have held her palbociclib. I am concerned that this might immunocompromised her.  We are of course continuing the letrozole. She continues to tolerate that well.Hot flashes and vaginal dryness are not a major issue. She never developed the arthralgias or myalgias that many patients can experience on this medication. She obtains it at a good price.     REVIEW OF SYSTEMS: Sharmin has just had further eyes surgery under Dr. Laurance Flatten yet. Her eyes are a little bloodshot today as a result. She is a bit worried a little bit depressed about not taking her Ibrance. She developed a sharp pain in her right breast a few days ago but within a few hours it completely resolved. There was no associated bleeding or erythema. A detailed review of systems today was otherwise stable.  PAST MEDICAL HISTORY: Past Medical History:  Diagnosis Date  . Breast cancer ([place])   . Cancer ([place]) 02/2016   right breast  . DIABETES MELLITUS, TYPE II 01/04/2007   only takes actoplus daily  . Dizziness and giddiness 02/29/2008  . DVT, HX OF    at age 44 in right buttocks  . Family history of  breast cancer   . GERD 01/04/2007   pt reports resolved   . GLAUCOMA 07/30/2008   both eyes  . History of blood transfusion    no abnormal  reaction  . History of uterine cancer  2000   hysterectomy done  . HYPERLIPIDEMIA 01/04/2007   taking Pravastatin daily  . HYPERTENSION 01/04/2007   takes Lisinopril daily  . Joint pain   . Joint swelling   . LEG PAIN, LEFT 07/06/2007  . NUMBNESS 07/30/2008   in fingers;pt states from [facility]  . OSTEOARTHRITIS, HIP 09/25/2009  . OTITIS MEDIA, ACUTE, BILATERAL 02/29/2008  . Overweight(278.02) 01/04/2007  . PONV (postoperative nausea and vomiting)   . SLEEP APNEA, OBSTRUCTIVE    doesn't use a cpap;study done about 80yr ago  . TRANSIENT ISCHEMIC ATTACK, HX OF 01/04/2007  . Vision loss    left eye    PAST SURGICAL HISTORY: Past Surgical History:  Procedure Laterality Date  . ABDOMINAL HYSTERECTOMY  2000  . CHOLECYSTECTOMY    . ENDOBRONCHIAL ULTRASOUND Bilateral 03/28/2016   Procedure: ENDOBRONCHIAL ULTRASOUND;  Surgeon: RCollene Gobble MD;  Location: [facility];  Service: Cardiopulmonary;  Laterality: Bilateral;  . EYE SURGERY  13   shunt left and lazer eye surgery on right cataract and retenia tear with repair  . growth removal  2004   from thumb  . KNEE ARTHROSCOPY Right   . mulitple eye surgeries     both eyes, cataracts with ioc done both eyes  . OOPHORECTOMY    . TOTAL HIP ARTHROPLASTY  06/24/2011   Procedure: TOTAL HIP ARTHROPLASTY;  Surgeon: FKerin Salen  Location: [facility]  Service: Orthopedics;  Laterality: Right;  . TOTAL HIP ARTHROPLASTY Left 11/12/2012   Dr RMayer Camel . TOTAL HIP ARTHROPLASTY Left 11/12/2012   Procedure: TOTAL HIP ARTHROPLASTY;  Surgeon: FKerin Salen MD;  [facility]  Service: Orthopedics;  Laterality: Left;  DEPUY PINNACLE    FAMILY HISTORY Family History  Problem Relation Age of Onset  . Dementia Mother   . Cancer Mother        Breast and lung cancer  . Stroke Sister   . Breast cancer Sister 550 . Heart attack Maternal Aunt   . Lung cancer Maternal Grandmother        non smoker  . Glaucoma Maternal Grandfather   . Anesthesia problems Neg Hx   The patient's father died at age  71 the patient's mother died at age 71 She had breast and lung cancers diagnosed shortly before her death. The patient had no brothers, 2 sisters. One sister was diagnosed with breast cancer at the age of 542  GYNECOLOGIC HISTORY:  No LMP recorded. Patient has had a hysterectomy. Menarche age 71 first live birth age 71 the patient is GX P1. She had a hysterectomy for endometrial cancer in the year 2000. She did not take hormone replacement. She did use oral contraceptives for your than 20 years remotely, with no complications.  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 She is home with her husband [name] He is a retired mDealerTheir son CJuanda [place]also [place]    ADVANCED DIRECTIVES: In place  HEALTH MAINTENANCE: Social History  Substance Use Topics  . Smoking status: Never Smoker  . Smokeless tobacco: Never Used  . Alcohol use No     Colonoscopy: Never  PAP: Status post hysterectomy  Bone density:  Remote   Allergies  Allergen Reactions  . Codeine Hives    Hycodan syrup  . Fluorescein Nausea And Vomiting    ? IV dye for retina specialist  . Lipitor [Atorvastatin Calcium]     Leg cramp  . Oxycodone Nausea And Vomiting    Patient vomited for 3 days after taking  . Sitagliptin Phosphate Nausea And Vomiting  . Sulfa Drugs Cross Reactors Nausea And Vomiting    Current Outpatient Prescriptions  Medication Sig Dispense Refill  . acetaminophen (TYLENOL) 500 MG tablet Take 1,000 mg by mouth every 4 (four) hours as needed for moderate pain or fever.    Marland Kitchen aspirin EC 81 MG tablet Take 81 mg by mouth daily at 6 PM. 1700    . bimatoprost (LUMIGAN) 0.01 % SOLN Place 1 drop into both eyes at bedtime.    . brimonidine (ALPHAGAN) 0.2 % ophthalmic solution Place 1 drop into both eyes 3 (three) times daily.    . brimonidine-timolol (COMBIGAN) 0.2-0.5 % ophthalmic solution Place 1 drop into  both eyes three times daily    . cholecalciferol (VITAMIN D) 1000 units tablet Take 1,000 Units by mouth daily.    . dorzolamide-timolol (COSOPT) 22.3-6.8 MG/ML ophthalmic solution Place 1 drop into both eyes 2 (two) times daily.    Marland Kitchen doxycycline (MONODOX) 50 MG capsule Take 50 mg by mouth daily at 6 PM. 1700    . letrozole (FEMARA) 2.5 MG tablet Take 1 tablet (2.5 mg total) by mouth at bedtime. (Patient taking differently: Take 2.5 mg by mouth daily at 6 PM. 1700)    . lisinopril (PRINIVIL,ZESTRIL) 20 MG tablet Take 1 tablet (20 mg total) by mouth daily. (Patient taking differently: Take 20 mg by mouth daily at 6 PM. 1700) 90 tablet 3  . lovastatin (MEVACOR) 20 MG tablet Take 1 tablet (20 mg total) by mouth every evening. (Patient taking differently: Take 20 mg by mouth daily at 6 PM. 1700) 90 tablet 3  . moxifloxacin (VIGAMOX) 0.5 % ophthalmic solution Place 1 drop into the right eye 3 (three) times daily.    Marland Kitchen neomycin-polymyxin-dexameth (MAXITROL) 0.1 % OINT Place 1 application into the right eye at bedtime.    . palbociclib (IBRANCE) 100 MG capsule Take 1 capsule (100 mg total) by mouth daily with breakfast. Take whole with food. 21 capsule 6  . pioglitazone-metformin (ACTOPLUS MET) 15-500 MG tablet Take 15-500 tablets by mouth daily. (Patient taking differently: Take 1 tablet by mouth daily at 6 PM. 1700) 90 tablet 3  . prednisoLONE acetate (PRED FORTE) 1 % ophthalmic suspension Place 1 drop into the right eye 4 (four) times daily.     No current facility-administered medications for this visit.      OBJECTIVE: Middle-aged white woman Who appears stated age  71:   01/03/17 1320  BP: (!) 162/66  Pulse: 61  Resp: 16  Temp: 98.9 F (37.2 C)     Body mass index is 40.85 kg/m.    ECOG FS:1 - Symptomatic but completely ambulatory Filed Weights   01/03/17 1320  Weight: 238 lb (108 kg)    Sclerae unicteric, EOMs intact Oropharynx clear and moist No cervical or supraclavicular  adenopathy Lungs no rales or rhonchi Heart regular rate and rhythm Abd soft, nontender, positive bowel sounds MSK no focal spinal tenderness, no upper extremity lymphedema Neuro: nonfocal, well oriented, appropriate affect Breasts: Deferred   LAB RESULTS:  CMP     Component Value Date/Time   NA 142 01/03/2017 1241  K 4.1 01/03/2017 1241   CL 107 07/23/2016 0550   CO2 27 01/03/2017 1241   GLUCOSE 128 01/03/2017 1241   BUN 11.8 01/03/2017 1241   CREATININE 0.8 01/03/2017 1241   CALCIUM 9.4 01/03/2017 1241   PROT 6.7 01/03/2017 1241   ALBUMIN 3.6 01/03/2017 1241   AST 10 01/03/2017 1241   ALT 14 01/03/2017 1241   ALKPHOS 62 01/03/2017 1241   BILITOT 0.39 01/03/2017 1241   GFRNONAA >60 07/23/2016 0550   GFRAA >60 07/23/2016 0550    INo results found for: SPEP, UPEP  Lab Results  Component Value Date   WBC 5.2 01/03/2017   NEUTROABS 2.9 01/03/2017   HGB 11.6 01/03/2017   HCT 34.9 01/03/2017   MCV 92.0 01/03/2017   PLT 305 01/03/2017      Chemistry      Component Value Date/Time   NA 142 01/03/2017 1241   K 4.1 01/03/2017 1241   CL 107 07/23/2016 0550   CO2 27 01/03/2017 1241   BUN 11.8 01/03/2017 1241   CREATININE 0.8 01/03/2017 1241      Component Value Date/Time   CALCIUM 9.4 01/03/2017 1241   ALKPHOS 62 01/03/2017 1241   AST 10 01/03/2017 1241   ALT 14 01/03/2017 1241   BILITOT 0.39 01/03/2017 1241       No results found for: LABCA2  No components found for: LABCA125  No results for input(s): INR in the last 168 hours.  Urinalysis    Component Value Date/Time   COLORURINE YELLOW 07/22/2016 1132   APPEARANCEUR HAZY (A) 07/22/2016 1132   LABSPEC 1.013 07/22/2016 1132   PHURINE 5.0 07/22/2016 1132   GLUCOSEU NEGATIVE 07/22/2016 1132   GLUCOSEU NEGATIVE 10/30/2014 1513   HGBUR MODERATE (A) 07/22/2016 1132   BILIRUBINUR NEGATIVE 07/22/2016 1132   KETONESUR 5 (A) 07/22/2016 1132   PROTEINUR NEGATIVE 07/22/2016 1132   UROBILINOGEN 0.2  10/30/2014 1513   NITRITE NEGATIVE 07/22/2016 1132   LEUKOCYTESUR LARGE (A) 07/22/2016 1132     STUDIES: No results found.   ELIGIBLE FOR AVAILABLE RESEARCH PROTOCOL: no  ASSESSMENT: 71 y.o. [place] woman with a remote history of early stage endometrial cancer, now status post right breast upper outer quadrant biopsy 01/22/2016 for a clinically multifocal T2 N0, stage 2A invasive ductal carcinoma, grade 1, estrogen and progesterone receptor positive, HER-2 negative, with an MIB-1 between 10 and 15%.  (1) right axillary lymph node biopsy 03/02/2016 positive  (2) genetics testing 01/13/2016 through the Custom gene panel offered by [facility] found no deleterious mutations in  ATM, BARD1, BRCA1, BRCA2, BRIP1, CDH1, CHEK2, EPCAM, FANCC, MLH1, MSH2, MSH6, MUTYH, NBN, PALB2, PMS2, POLD1, PTEN, RAD51C, RAD51D, TP53, and XRCC2  METASTATIC DISEASE: OCT 2017 (3) CT scans of the chest abdomen and pelvis obtained 03/10/2016 are consistent with bilateral lung metastases and mediastinal and hilar nodal involvement, but no liver or bone spread  (a) bronchoscopic lymph node biopsy 2 (station 7, 13R) 03/28/2016 confirms metastatic adenocarcinoma, estrogen receptor positive, HER-2 not amplified  (b) baseline CA-27-29 on 04/18/2016 was 137.5.  (4) letrozole started 03/15/2016, palbociclib added 03/29/2016 at 125 mg/day, 21/7  (a) dose decreased to 100 mg per day, 21/7, beginning with February cycle   PLAN: Tarae has had a very nice response to her treatment and is tolerating it well. At this point I think it would be prudent to optimize local treatment.  I am in complete agreement with Dr. Lucia Gaskins that a double lumpectomy is feasible. This is the patient's choice  as well and she is very excited about not losing her breast.  She is also now willing to undergo radiation afterwards. Again I think that is a prudent move in this patient who hopefully will live a relatively long time despite her  metastatic disease.  In the meantime we are interrupting her palbociclib dose. I am concerned that this may mean a compromise her. This is worrying her considerably. I did reassure her today that she will be started within 10 days of her surgery, namely back on palbociclib as of August 13. She will see me a few days later to discuss surgical results  Aarilyn has a good understanding of this plan. She knows to call for any problems that may develop before the next visit here.  Chauncey Cruel, MD   01/03/2017 9:18 PM Medical Oncology and Hematology [facility] [address] [place], [place] [address] Tel. [phone]    Fax. [phone]

## 2017-01-03 ENCOUNTER — Ambulatory Visit (HOSPITAL_BASED_OUTPATIENT_CLINIC_OR_DEPARTMENT_OTHER): Payer: Medicare Other | Admitting: Oncology

## 2017-01-03 ENCOUNTER — Other Ambulatory Visit (HOSPITAL_BASED_OUTPATIENT_CLINIC_OR_DEPARTMENT_OTHER): Payer: Medicare Other

## 2017-01-03 VITALS — BP 162/66 | HR 61 | Temp 98.9°F | Resp 16 | Ht 64.0 in | Wt 238.0 lb

## 2017-01-03 DIAGNOSIS — K760 Fatty (change of) liver, not elsewhere classified: Secondary | ICD-10-CM

## 2017-01-03 DIAGNOSIS — C7801 Secondary malignant neoplasm of right lung: Secondary | ICD-10-CM | POA: Diagnosis not present

## 2017-01-03 DIAGNOSIS — Z170 Estrogen receptor positive status [ER+]: Secondary | ICD-10-CM

## 2017-01-03 DIAGNOSIS — C7802 Secondary malignant neoplasm of left lung: Secondary | ICD-10-CM

## 2017-01-03 DIAGNOSIS — C50411 Malignant neoplasm of upper-outer quadrant of right female breast: Secondary | ICD-10-CM

## 2017-01-03 DIAGNOSIS — Z8542 Personal history of malignant neoplasm of other parts of uterus: Secondary | ICD-10-CM

## 2017-01-03 DIAGNOSIS — C7800 Secondary malignant neoplasm of unspecified lung: Secondary | ICD-10-CM

## 2017-01-03 LAB — COMPREHENSIVE METABOLIC PANEL
ALT: 14 U/L (ref 0–55)
AST: 10 U/L (ref 5–34)
Albumin: 3.6 g/dL (ref 3.5–5.0)
Alkaline Phosphatase: 62 U/L (ref 40–150)
Anion Gap: 7 mEq/L (ref 3–11)
BUN: 11.8 mg/dL (ref 7.0–26.0)
CO2: 27 mEq/L (ref 22–29)
Calcium: 9.4 mg/dL (ref 8.4–10.4)
Chloride: 108 mEq/L (ref 98–109)
Creatinine: 0.8 mg/dL (ref 0.6–1.1)
EGFR: 71 mL/min/{1.73_m2} — ABNORMAL LOW (ref >90)
Glucose: 128 mg/dl (ref 70–140)
Potassium: 4.1 mEq/L (ref 3.5–5.1)
Sodium: 142 mEq/L (ref 136–145)
Total Bilirubin: 0.39 mg/dL (ref 0.20–1.20)
Total Protein: 6.7 g/dL (ref 6.4–8.3)

## 2017-01-03 LAB — CBC WITH DIFFERENTIAL/PLATELET
BASO%: 0.8 % (ref 0.0–2.0)
Basophils Absolute: 0 10*3/uL (ref 0.0–0.1)
EOS%: 1.4 % (ref 0.0–7.0)
Eosinophils Absolute: 0.1 10*3/uL (ref 0.0–0.5)
HCT: 34.9 % (ref 34.8–46.6)
HGB: 11.6 g/dL (ref 11.6–15.9)
LYMPH%: 31.2 % (ref 14.0–49.7)
MCH: 30.6 pg (ref 25.1–34.0)
MCHC: 33.3 g/dL (ref 31.5–36.0)
MCV: 92 fL (ref 79.5–101.0)
MONO#: 0.5 10*3/uL (ref 0.1–0.9)
MONO%: 10.4 % (ref 0.0–14.0)
NEUT#: 2.9 10*3/uL (ref 1.5–6.5)
NEUT%: 56.2 % (ref 38.4–76.8)
Platelets: 305 10*3/uL (ref 145–400)
RBC: 3.79 10*6/uL (ref 3.70–5.45)
RDW: 17.8 % — ABNORMAL HIGH (ref 11.2–14.5)
WBC: 5.2 10*3/uL (ref 3.9–10.3)
lymph#: 1.6 10*3/uL (ref 0.9–3.3)

## 2017-01-03 LAB — TECHNOLOGIST REVIEW

## 2017-01-04 ENCOUNTER — Other Ambulatory Visit: Payer: Self-pay | Admitting: Surgery

## 2017-01-04 ENCOUNTER — Encounter (HOSPITAL_COMMUNITY)
Admission: RE | Admit: 2017-01-04 | Discharge: 2017-01-04 | Disposition: A | Discharge status: Home / Self Care | Payer: Medicare Other | Source: Ambulatory Visit | Attending: Surgery | Admitting: Surgery

## 2017-01-04 ENCOUNTER — Other Ambulatory Visit (HOSPITAL_COMMUNITY): Payer: Self-pay | Admitting: *Deleted

## 2017-01-04 ENCOUNTER — Encounter (HOSPITAL_COMMUNITY): Payer: Self-pay

## 2017-01-04 DIAGNOSIS — Z01812 Encounter for preprocedural laboratory examination: Secondary | ICD-10-CM | POA: Insufficient documentation

## 2017-01-04 DIAGNOSIS — C50911 Malignant neoplasm of unspecified site of right female breast: Secondary | ICD-10-CM

## 2017-01-04 HISTORY — DX: Pneumonia, unspecified organism: J18.9

## 2017-01-04 HISTORY — DX: Dyspnea, unspecified: R06.00

## 2017-01-04 HISTORY — DX: Cramp and spasm: R25.2

## 2017-01-04 HISTORY — DX: Peripheral vascular disease, unspecified: I73.9

## 2017-01-04 LAB — GLUCOSE, CAPILLARY: Glucose-Capillary: 231 mg/dL — ABNORMAL HIGH (ref 65–99)

## 2017-01-04 LAB — CANCER ANTIGEN 27.29: CA 27.29: 22.8 U/mL (ref 0.0–38.6)

## 2017-01-04 NOTE — Pre-Procedure Instructions (Addendum)
[name]  01/04/2017    Your procedure is scheduled on Friday, January 13, 2017 at 2:15 PM.   Report to [facility] Entrance "A" Admitting Office at 12:15 PM.   Call this number if you have problems the morning of surgery: [phone]   Questions prior to day of surgery, please call [phone] between 8 & 4 PM.   Remember:  Do not eat food or drink liquids after midnight Thursday, 01/12/17.  Take these medicines the morning of surgery with A SIP OF WATER: Tylenol - if needed, may use eye drops  Stop Aspirin 5 days prior to surgery. Do not use NSAIDS (Ibuprofen, Aleve, etc) 5 days prior to surgery.  Drink 8 ounce bottle of water 2 hours prior to arrival day of surgery - need to drink it at 10:15 AM day of surgery.   How to Manage Your Diabetes Before Surgery   Why is it important to control my blood sugar before and after surgery?   Improving blood sugar levels before and after surgery helps healing and can limit problems.  A way of improving blood sugar control is eating a healthy diet by:  - Eating less sugar and carbohydrates  - Increasing activity/exercise  - Talk with your doctor about reaching your blood sugar goals  High blood sugars (greater than 180 mg/dL) can raise your risk of infections and slow down your recovery so you will need to focus on controlling your diabetes during the weeks before surgery.  Make sure that the doctor who takes care of your diabetes knows about your planned surgery including the date and location.  How do I manage my blood sugars before surgery?   Check your blood sugar at least 4 times a day, 2 days before surgery to make sure that they are not too high or low.  Check your blood sugar the morning of your surgery when you wake up and every 2 hours until you get to the Short-Stay unit.  Treat a low blood sugar (less than 70 mg/dL) with 1/2 cup of clear juice (cranberry or apple), 4 glucose tablets, OR glucose  gel.  Recheck blood sugar in 15 minutes after treatment (to make sure it is greater than 70 mg/dL).  If blood sugar is not greater than 70 mg/dL on re-check, call [phone] for further instructions.   Report your blood sugar to the Short-Stay nurse when you get to Short-Stay.  References:  [facility], 2007 "How to Manage your Diabetes Before and After Surgery".  What do I do about my diabetes medications?   Do not take evening dose of Pioglitazone-Metformin (Actoplus Met) night before surgery or the morning of surgery.   Do not wear jewelry, make-up or nail polish.  Do not wear lotions, powders, perfumes or deodorant.  Do not shave 48 hours prior to surgery.    Do not bring valuables to the hospital.  [facility] is not responsible for any belongings or valuables.  Contacts, dentures or bridgework may not be worn into surgery.  Leave your suitcase in the car.  After surgery it may be brought to your room.  For patients admitted to the hospital, discharge time will be determined by your treatment team.  Patients discharged the day of surgery will not be allowed to drive home.   [facility] - Preparing for Surgery  Before surgery, you can play an important role.  Because skin is not sterile, your skin needs to be  as free of germs as possible.  You can reduce the number of germs on you skin by washing with CHG (chlorahexidine gluconate) soap before surgery.  CHG is an antiseptic cleaner which kills germs and bonds with the skin to continue killing germs even after washing.  Please DO NOT use if you have an allergy to CHG or antibacterial soaps.  If your skin becomes reddened/irritated stop using the CHG and inform your nurse when you arrive at Short Stay.  Do not shave (including legs and underarms) for at least 48 hours prior to the first CHG shower.  You may shave your face.  Please follow these instructions carefully:   1.  Shower with CHG Soap the  night before surgery and the                    morning of Surgery.  2.  If you choose to wash your hair, wash your hair first as usual with your       normal shampoo.  3.  After you shampoo, rinse your hair and body thoroughly to remove the shampoo.  4.  Use CHG as you would any other liquid soap.  You can apply chg directly       to the skin and wash gently with scrungie or a clean washcloth.  5.  Apply the CHG Soap to your body ONLY FROM THE NECK DOWN.        Do not use on open wounds or open sores.  Avoid contact with your eyes, ears, mouth and genitals (private parts).  Wash genitals (private parts) with your normal soap.  6.  Wash thoroughly, paying special attention to the area where your surgery        will be performed.  7.  Thoroughly rinse your body with warm water from the neck down.  8.  DO NOT shower/wash with your normal soap after using and rinsing off       the CHG Soap.  9.  Pat yourself dry with a clean towel.            10.  Wear clean pajamas.            11.  Place clean sheets on your bed the night of your first shower and do not        sleep with pets.  Day of Surgery  Do not apply any lotions/deodorants the morning of surgery.  Please wear clean clothes to the hospital.   Please read over the fact sheets that you were given.

## 2017-01-04 NOTE — Progress Notes (Signed)
Pt denies any cardiac history or chest pain. She states the only sob she has is occasional and due to hx of lung cancer. Pt states that she was started on BP medicine and diabetes medicine by her PCP to "prevent getting those problems". She states she does not check her blood sugar and thinks her last A1C was 6 months to a year ago and she doesn't know what it was.

## 2017-01-05 LAB — HEMOGLOBIN A1C
Hgb A1c MFr Bld: 6.7 % — ABNORMAL HIGH (ref 4.8–5.6)
Mean Plasma Glucose: 146 mg/dL

## 2017-01-09 ENCOUNTER — Telehealth: Payer: Self-pay

## 2017-01-09 NOTE — Telephone Encounter (Signed)
Pt was questioning her appt. It is incorrect in her MyChart acct. It is correct in epic. 8/21 at 4 pm

## 2017-01-10 ENCOUNTER — Ambulatory Visit
Admission: RE | Admit: 2017-01-10 | Discharge: 2017-01-10 | Disposition: A | Discharge status: Home / Self Care | Payer: Medicare Other | Source: Ambulatory Visit | Attending: Surgery | Admitting: Surgery

## 2017-01-10 ENCOUNTER — Other Ambulatory Visit: Payer: Self-pay | Admitting: Surgery

## 2017-01-10 DIAGNOSIS — C50811 Malignant neoplasm of overlapping sites of right female breast: Secondary | ICD-10-CM | POA: Diagnosis not present

## 2017-01-10 DIAGNOSIS — C50911 Malignant neoplasm of unspecified site of right female breast: Secondary | ICD-10-CM

## 2017-01-10 DIAGNOSIS — C50311 Malignant neoplasm of lower-inner quadrant of right female breast: Secondary | ICD-10-CM | POA: Diagnosis not present

## 2017-01-11 HISTORY — PX: OTHER SURGICAL HISTORY: SHX169

## 2017-01-12 NOTE — H&P (Addendum)
[name]  Location: [facility] Patient #: [id number] DOB: 11/17/1945 Married / Language: English / Race: White Female  History of Present Illness   The patient is a 71 year old female who presents with a complaint of breast cancer.   Her PCP is Dr. James John.  [facility]. Her oncologist are Drs. Magrinat and Moody.  She comes with her husband, Tom.  She underwent an MRI on 12/02/2016 - this showed significant improvement in her right brast cancer. There are now only 3 nodules seen at 12 o'clock position (only one of these nodules was biopsied originally). The breast cancer at 4 o'clock is not seen at all. And the axillary lymph nodes are better (one was positive on biopsy on 03/02/2016). Her most recent mammogram and US on 10/04/2016 showed a decrease in the 12 o'clock mass, the 4 o'clock mass not visulaized, and the right axillary lymph nodes normal appearing.  So we talked: 1) about waiting for further treatment, 2) proceeding with a double lumpectomy of the right breast, 3) doing a right mastectomy. She is interested in breast conservation. I would treat this as is she did not have metastatic disease. I think the one key is making sure I've localized the right breast correctly. I reviewed the MRI today with T. Lawrence - but with more of an eye whether lumpectomy was feasible - not how to do it..  History of breast cancer: [facility]. She was found to have 3 masses in her right breast - 12 o'clock, 12 o'clock, and 4 o'clock. She had biopsies of one 12 o'clock lesion (2.0 cm) and the 4 o'clock lesion (0.9 cm) on 01/22/2016.  The pathology ([id number]) - 1.) at 12 o'clock - IDC, ER-95%, PR-95%, Her2Neu-neg, and Ki67 - 10%, and 2.) at 4 o'clock - IDC, ER-95%, PR-95%, Her2Neu-neg, and Ki67 - 15%. She's had no prior breast biopsy. She is not on hormone therapy. She had a hysterectomy in  2000 for benign disease. Her sister died of breast cancer. Her mother died of breast cancer. [note: Dr. Toth did her mother's surgery - she had excess skin which Ms. Boyan did not like. She has asked not to see Dr. Toth.]  Unfortunately, during her work up, she was found to have innumerable pulmonary nodules on CT scan on 03/16/2016. 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  She has been on Ibrance and letrozole by dr. Magrinat and had a good response.  Her baseline CA 27.29 was 137 on 04/18/2016. The most recent CA 27.29 was 36.2 (in normal range).  I discussed the options for breast cancer treatment with the patient. That the purpose of local breast cancer management now is different than if she did not have metastatic disease. I discussed the surgical options of lumpectomy vs. mastectomy. She has seen a plastic surgeon who said that she is not a candidate for reconstruction. The risks of surgery include, but are not limited to, bleeding, infection, the need for further surgery, and nerve injury.  Past Medical History: 1. TIA - 2004 No residual problems 2. HTN 3. Hypercholesterolemia 4. DM - on pills Last Hgb A1C - about 6.6 5. Both eyes have had trouble with glaucoma. She said that she is going blind over the next 5 years. She said that she has had 14 operations on her eyes Sees Dr. Stonecipher here, and Drs. Moya ([facility] in [place]) and [place] in   [place] Dr. Ander Slade is talking about working on a shunt in her right eye. 6. On Prednisilone long term 7. Obese  Social History: She comes with her husband, Gershon Mussel. She is retired from [employer]. She has one son, [name], 44 yo, who lives in [place].   Medication History Malachy Moan, [place]; 12/13/2016 1:35 PM) PredniSONE (10MG Tablet, Oral) Active. (74m. pt takes two daily) Moxifloxacin HCl (0.5% Solution, Ophthalmic) Active.  (starts 7/13) Lovastatin (20MG Tablet, Oral) Active. Pioglitazone HCl-Metformin HCl (15-500MG Tablet, Oral) Active. Ibrance (100MG Capsule, Oral) Active. Letrozole (2.5MG Tablet, Oral) Active. Lumigan (0.01% Solution, Ophthalmic) Active. Dorzolamide HCl-Timolol Mal (22.3-6.8MG/ML Solution, Ophthalmic) Active. Brimonidine Tartrate (0.2% Solution, Ophthalmic) Active. Lisinopril (20MG Tablet, Oral) Active. Vitamin D (Cholecalciferol) (Oral) Specific strength unknown - Active. Aspirin (81MG Tablet Chewable, Oral) Active. Medications Reconciled  Vitals (Malachy MoanRMA; 12/13/2016 1:36 PM) 12/13/2016 1:35 PM Weight: 5 lb Height: 64in Body Surface Area: 0.41 m Body Mass Index: 0.86 kg/m  Temp.: 98.54F  Pulse: 85 (Regular)  BP: 120/80 (Sitting, Left Arm, Standard)   Physical Exam  General: Obese WF alert and generally healthy appearing. Skin: Inspection and palpation of the skin unremarkable.  Eyes: Conjunctivae white, pupils equal. Face, ears, nose, mouth, and throat: Face - normal. Normal ears and nose. Lips and teeth normal.  Neck: Supple. No mass. Trachea midline. No thyroid mass.  Lymph Nodes: No supraclavicular or cervical adenopathy. No axillary adenopathy.  Lungs: Normal respiratory effort. Clear to auscultation and symmetric breath sounds. Cardiovascular: Regular rate and rythm. Normal auscultation of the heart. No murmur or rub.  Breast: Right - There is a fullness in the UOQ of the left breast, but I'm not sure it is a mass. I don't feel any specific area of concern. left - no mass   Abdomen: Soft. No mass. Liver and spleen not palpable. No tenderness. No hernia. Normal bowel sounds.   Musculoskeletal/extremities: Normal gait. Good strength and ROM in upper and lower extremities.   Neurologic: Grossly intact to motor and sensory function.   Psychiatric: Has normal mood and affect. Judgement and  insight appear normal.   Assessment & Plan  1.  BREAST CANCER, STAGE 1, RIGHT (C50.911)  Story: She was found to have 3 masses in her right breast - 12 o'clock, 12 o'clock, and 4 o'clock. She had biopsies of one 12 o'clock lesion (2.0 cm) and the 4 o'clock lesion (0.9 cm) on 01/22/2016.  The pathology [id number] - 1.) at 12 o'clock - IDC, ER-95%, PR-95%, Her2Neu-neg, and Ki67 - 10%, and 2.) at 4 o'clock - IDC, ER-95%, PR-95%, Her2Neu-neg, and Ki67 - 15%.   She has metastatic disease to both lungs - which has responded to Ibrance and Letrozole.   Oncology - Drs. Magrinat and Moody  Plan:   1) Plan right breast lumpectomy x 2 and right targeted axillary node dissection (3 seeds).    Dr. OJake Sharkcould not localize the axillary node that was biopsied.  He discussed this on the phone.  Addendum Note(Kandra Graven H. NLucia GaskinsMD; 01/04/2017 7:57 AM)  Note from Dr. MJana Hakimin support of double lumpectomy.  He is going to interrupt her palbociclib until 10 days after the surgery.   2) See Dr. MLisbeth Renshawpre op to discuss radiaition therapy.  2.  MALIGNANT NEOPLASM METASTATIC TO LUNG, UNSPECIFIED LATERALITY (C78.00)  3. TIA - 2004  No residual problems 4. HTN 5. Hypercholesterolemia 6. DM - on pills Last Hgb A1C - about 6.6 7. Both eyes have had trouble with glaucoma.  She said that she is going blind over the next 5 years.  She said that she has had 14 operations on her eyes Sees Dr. Lucita Ferrara here, and Drs. Moya ([facility] in [place]) and Dr. Randell Loop in [place] Dr. Ander Slade is talking about working on a shunt in her right eye. 8. On Prednisilone long term 9. Obese   Alphonsa Overall, MD, [facility] Pager: [phone] Office phone:  [phone]

## 2017-01-13 ENCOUNTER — Encounter (HOSPITAL_COMMUNITY)
Admission: RE | Admit: 2017-01-13 | Discharge: 2017-01-13 | Disposition: A | Discharge status: Home / Self Care | Payer: Medicare Other | Source: Ambulatory Visit | Attending: Surgery | Admitting: Surgery

## 2017-01-13 ENCOUNTER — Ambulatory Visit (HOSPITAL_COMMUNITY): Payer: Medicare Other | Admitting: Anesthesiology

## 2017-01-13 ENCOUNTER — Ambulatory Visit
Admission: RE | Admit: 2017-01-13 | Discharge: 2017-01-13 | Disposition: A | Discharge status: Home / Self Care | Payer: Medicare Other | Source: Ambulatory Visit | Attending: Surgery | Admitting: Surgery

## 2017-01-13 ENCOUNTER — Encounter (HOSPITAL_COMMUNITY)
Admission: RE | Disposition: A | Discharge status: Home / Self Care | Payer: Self-pay | Source: Ambulatory Visit | Attending: Surgery

## 2017-01-13 ENCOUNTER — Encounter (HOSPITAL_COMMUNITY): Payer: Self-pay | Admitting: Anesthesiology

## 2017-01-13 ENCOUNTER — Ambulatory Visit (HOSPITAL_COMMUNITY)
Admission: RE | Admit: 2017-01-13 | Discharge: 2017-01-13 | Disposition: A | Discharge status: Home / Self Care | Payer: Medicare Other | Source: Ambulatory Visit | Attending: Surgery | Admitting: Surgery

## 2017-01-13 DIAGNOSIS — K219 Gastro-esophageal reflux disease without esophagitis: Secondary | ICD-10-CM | POA: Insufficient documentation

## 2017-01-13 DIAGNOSIS — Z85118 Personal history of other malignant neoplasm of bronchus and lung: Secondary | ICD-10-CM | POA: Diagnosis not present

## 2017-01-13 DIAGNOSIS — Z8542 Personal history of malignant neoplasm of other parts of uterus: Secondary | ICD-10-CM | POA: Diagnosis not present

## 2017-01-13 DIAGNOSIS — Z7982 Long term (current) use of aspirin: Secondary | ICD-10-CM | POA: Insufficient documentation

## 2017-01-13 DIAGNOSIS — M1990 Unspecified osteoarthritis, unspecified site: Secondary | ICD-10-CM | POA: Diagnosis not present

## 2017-01-13 DIAGNOSIS — E7800 Pure hypercholesterolemia, unspecified: Secondary | ICD-10-CM | POA: Diagnosis not present

## 2017-01-13 DIAGNOSIS — C50911 Malignant neoplasm of unspecified site of right female breast: Secondary | ICD-10-CM | POA: Diagnosis not present

## 2017-01-13 DIAGNOSIS — C779 Secondary and unspecified malignant neoplasm of lymph node, unspecified: Secondary | ICD-10-CM | POA: Diagnosis not present

## 2017-01-13 DIAGNOSIS — Z8673 Personal history of transient ischemic attack (TIA), and cerebral infarction without residual deficits: Secondary | ICD-10-CM | POA: Diagnosis not present

## 2017-01-13 DIAGNOSIS — Z79899 Other long term (current) drug therapy: Secondary | ICD-10-CM | POA: Insufficient documentation

## 2017-01-13 DIAGNOSIS — G8918 Other acute postprocedural pain: Secondary | ICD-10-CM | POA: Diagnosis not present

## 2017-01-13 DIAGNOSIS — Z7952 Long term (current) use of systemic steroids: Secondary | ICD-10-CM | POA: Insufficient documentation

## 2017-01-13 DIAGNOSIS — Z853 Personal history of malignant neoplasm of breast: Secondary | ICD-10-CM | POA: Diagnosis not present

## 2017-01-13 DIAGNOSIS — C50811 Malignant neoplasm of overlapping sites of right female breast: Secondary | ICD-10-CM | POA: Diagnosis not present

## 2017-01-13 DIAGNOSIS — E1151 Type 2 diabetes mellitus with diabetic peripheral angiopathy without gangrene: Secondary | ICD-10-CM | POA: Diagnosis not present

## 2017-01-13 DIAGNOSIS — E1140 Type 2 diabetes mellitus with diabetic neuropathy, unspecified: Secondary | ICD-10-CM | POA: Diagnosis not present

## 2017-01-13 DIAGNOSIS — C50311 Malignant neoplasm of lower-inner quadrant of right female breast: Secondary | ICD-10-CM | POA: Diagnosis not present

## 2017-01-13 DIAGNOSIS — Z6841 Body Mass Index (BMI) 40.0 and over, adult: Secondary | ICD-10-CM | POA: Diagnosis not present

## 2017-01-13 DIAGNOSIS — Z7984 Long term (current) use of oral hypoglycemic drugs: Secondary | ICD-10-CM | POA: Diagnosis not present

## 2017-01-13 DIAGNOSIS — F329 Major depressive disorder, single episode, unspecified: Secondary | ICD-10-CM | POA: Diagnosis not present

## 2017-01-13 DIAGNOSIS — N6011 Diffuse cystic mastopathy of right breast: Secondary | ICD-10-CM | POA: Diagnosis not present

## 2017-01-13 DIAGNOSIS — E785 Hyperlipidemia, unspecified: Secondary | ICD-10-CM | POA: Insufficient documentation

## 2017-01-13 DIAGNOSIS — H409 Unspecified glaucoma: Secondary | ICD-10-CM | POA: Diagnosis not present

## 2017-01-13 DIAGNOSIS — I10 Essential (primary) hypertension: Secondary | ICD-10-CM | POA: Insufficient documentation

## 2017-01-13 DIAGNOSIS — J45909 Unspecified asthma, uncomplicated: Secondary | ICD-10-CM | POA: Diagnosis not present

## 2017-01-13 DIAGNOSIS — Z9221 Personal history of antineoplastic chemotherapy: Secondary | ICD-10-CM | POA: Diagnosis not present

## 2017-01-13 DIAGNOSIS — C50411 Malignant neoplasm of upper-outer quadrant of right female breast: Secondary | ICD-10-CM | POA: Diagnosis not present

## 2017-01-13 DIAGNOSIS — C773 Secondary and unspecified malignant neoplasm of axilla and upper limb lymph nodes: Secondary | ICD-10-CM | POA: Diagnosis not present

## 2017-01-13 HISTORY — PX: BREAST LUMPECTOMY WITH RADIOACTIVE SEED AND SENTINEL LYMPH NODE BIOPSY: SHX6550

## 2017-01-13 HISTORY — PX: BREAST LUMPECTOMY: SHX2

## 2017-01-13 LAB — GLUCOSE, CAPILLARY
Glucose-Capillary: 135 mg/dL — ABNORMAL HIGH (ref 65–99)
Glucose-Capillary: 152 mg/dL — ABNORMAL HIGH (ref 65–99)

## 2017-01-13 SURGERY — BREAST LUMPECTOMY WITH RADIOACTIVE SEED AND SENTINEL LYMPH NODE BIOPSY
Anesthesia: General | Site: Breast | Laterality: Right

## 2017-01-13 MED ORDER — ALBUMIN HUMAN 5 % IV SOLN
INTRAVENOUS | Status: DC | PRN
  Administered 2017-01-13 (×2): via INTRAVENOUS

## 2017-01-13 MED ORDER — ACETAMINOPHEN 500 MG PO TABS
1000.0000 mg | ORAL_TABLET | ORAL | Status: AC
Start: 2017-01-14 — End: 2017-01-13
  Administered 2017-01-13: 1000 mg via ORAL
  Filled 2017-01-13: qty 2

## 2017-01-13 MED ORDER — TECHNETIUM TC 99M SULFUR COLLOID FILTERED
1.0000 | Freq: Once | INTRAVENOUS | Status: AC | PRN
Start: 2017-01-13 — End: 2017-01-13
  Administered 2017-01-13: 1 via INTRADERMAL

## 2017-01-13 MED ORDER — FENTANYL CITRATE (PF) 100 MCG/2ML IJ SOLN
50.0000 ug | Freq: Once | INTRAMUSCULAR | Status: AC
  Administered 2017-01-13: 50 ug via INTRAVENOUS

## 2017-01-13 MED ORDER — FENTANYL CITRATE (PF) 100 MCG/2ML IJ SOLN: 25.0000 ug | INTRAMUSCULAR | Status: DC | PRN

## 2017-01-13 MED ORDER — BUPIVACAINE-EPINEPHRINE (PF) 0.25% -1:200000 IJ SOLN
INTRAMUSCULAR | Status: AC
  Filled 2017-01-13: qty 30

## 2017-01-13 MED ORDER — LISINOPRIL 20 MG PO TABS
20.0000 mg | ORAL_TABLET | Freq: Every day | ORAL | Status: DC
  Administered 2017-01-13: 20 mg via ORAL
  Filled 2017-01-13: qty 1

## 2017-01-13 MED ORDER — FENTANYL CITRATE (PF) 250 MCG/5ML IJ SOLN
INTRAMUSCULAR | Status: AC
  Filled 2017-01-13: qty 5

## 2017-01-13 MED ORDER — 0.9 % SODIUM CHLORIDE (POUR BTL) OPTIME
TOPICAL | Status: DC | PRN
  Administered 2017-01-13: 1000 mL

## 2017-01-13 MED ORDER — PROPOFOL 500 MG/50ML IV EMUL
INTRAVENOUS | Status: DC | PRN
  Administered 2017-01-13: 25 ug/kg/min via INTRAVENOUS
  Administered 2017-01-13: 15 ug/kg/min via INTRAVENOUS

## 2017-01-13 MED ORDER — ONDANSETRON HCL 4 MG/2ML IJ SOLN
INTRAMUSCULAR | Status: AC
  Filled 2017-01-13: qty 2

## 2017-01-13 MED ORDER — ONDANSETRON HCL 4 MG/2ML IJ SOLN
INTRAMUSCULAR | Status: DC | PRN
  Administered 2017-01-13: 4 mg via INTRAVENOUS

## 2017-01-13 MED ORDER — CHLORHEXIDINE GLUCONATE CLOTH 2 % EX PADS: 6.0000 | MEDICATED_PAD | Freq: Once | CUTANEOUS | Status: DC

## 2017-01-13 MED ORDER — MEPERIDINE HCL 25 MG/ML IJ SOLN: 6.2500 mg | INTRAMUSCULAR | Status: DC | PRN

## 2017-01-13 MED ORDER — MIDAZOLAM HCL 2 MG/2ML IJ SOLN
2.0000 mg | Freq: Once | INTRAMUSCULAR | Status: AC
  Administered 2017-01-13: 2 mg via INTRAVENOUS

## 2017-01-13 MED ORDER — LIDOCAINE HCL (CARDIAC) 20 MG/ML IV SOLN
INTRAVENOUS | Status: DC | PRN
  Administered 2017-01-13: 60 mg via INTRATRACHEAL

## 2017-01-13 MED ORDER — LIDOCAINE 2% (20 MG/ML) 5 ML SYRINGE
INTRAMUSCULAR | Status: AC
  Filled 2017-01-13: qty 5

## 2017-01-13 MED ORDER — DEXAMETHASONE SODIUM PHOSPHATE 10 MG/ML IJ SOLN
INTRAMUSCULAR | Status: DC | PRN
  Administered 2017-01-13: 10 mg via INTRAVENOUS

## 2017-01-13 MED ORDER — BUPIVACAINE-EPINEPHRINE (PF) 0.5% -1:200000 IJ SOLN
INTRAMUSCULAR | Status: DC | PRN
  Administered 2017-01-13: 30 mL via PERINEURAL

## 2017-01-13 MED ORDER — ARTIFICIAL TEARS OPHTHALMIC OINT
TOPICAL_OINTMENT | OPHTHALMIC | Status: AC
  Filled 2017-01-13: qty 3.5

## 2017-01-13 MED ORDER — FENTANYL CITRATE (PF) 100 MCG/2ML IJ SOLN
INTRAMUSCULAR | Status: AC
  Administered 2017-01-13: 50 ug via INTRAVENOUS
  Filled 2017-01-13: qty 2

## 2017-01-13 MED ORDER — METOCLOPRAMIDE HCL 5 MG/ML IJ SOLN
10.0000 mg | Freq: Once | INTRAMUSCULAR | Status: AC | PRN
  Administered 2017-01-13: 10 mg via INTRAVENOUS

## 2017-01-13 MED ORDER — FENTANYL CITRATE (PF) 250 MCG/5ML IJ SOLN
INTRAMUSCULAR | Status: DC | PRN
  Administered 2017-01-13: 100 ug via INTRAVENOUS
  Administered 2017-01-13 (×2): 25 ug via INTRAVENOUS

## 2017-01-13 MED ORDER — ARTIFICIAL TEARS OPHTHALMIC OINT
TOPICAL_OINTMENT | OPHTHALMIC | Status: DC | PRN
  Administered 2017-01-13: 1 via OPHTHALMIC

## 2017-01-13 MED ORDER — GLYCOPYRROLATE 0.2 MG/ML IJ SOLN
INTRAMUSCULAR | Status: DC | PRN
  Administered 2017-01-13: 0.2 mg via INTRAVENOUS

## 2017-01-13 MED ORDER — DEXAMETHASONE SODIUM PHOSPHATE 10 MG/ML IJ SOLN
INTRAMUSCULAR | Status: AC
  Filled 2017-01-13: qty 1

## 2017-01-13 MED ORDER — CEFAZOLIN SODIUM-DEXTROSE 2-4 GM/100ML-% IV SOLN
2.0000 g | INTRAVENOUS | Status: AC
  Administered 2017-01-13: 2 g via INTRAVENOUS
  Filled 2017-01-13: qty 100

## 2017-01-13 MED ORDER — PROPOFOL 1000 MG/100ML IV EMUL
INTRAVENOUS | Status: AC
  Filled 2017-01-13: qty 100

## 2017-01-13 MED ORDER — METOCLOPRAMIDE HCL 5 MG/ML IJ SOLN
INTRAMUSCULAR | Status: AC
  Filled 2017-01-13: qty 2

## 2017-01-13 MED ORDER — SODIUM CHLORIDE 0.9 % IJ SOLN
INTRAMUSCULAR | Status: AC
  Filled 2017-01-13: qty 10

## 2017-01-13 MED ORDER — GLYCOPYRROLATE 0.2 MG/ML IJ SOLN
INTRAMUSCULAR | Status: AC
  Administered 2017-01-13: 0.2 mg via INTRAVENOUS
  Filled 2017-01-13: qty 1

## 2017-01-13 MED ORDER — SCOPOLAMINE 1 MG/3DAYS TD PT72
MEDICATED_PATCH | TRANSDERMAL | Status: AC
  Administered 2017-01-13: 1.5 mg
  Filled 2017-01-13: qty 1

## 2017-01-13 MED ORDER — PHENYLEPHRINE HCL 10 MG/ML IJ SOLN
INTRAMUSCULAR | Status: DC | PRN
  Administered 2017-01-13: 40 ug via INTRAVENOUS
  Administered 2017-01-13: 80 ug via INTRAVENOUS
  Administered 2017-01-13: 40 ug via INTRAVENOUS
  Administered 2017-01-13 (×2): 80 ug via INTRAVENOUS
  Administered 2017-01-13: 120 ug via INTRAVENOUS

## 2017-01-13 MED ORDER — LABETALOL HCL 5 MG/ML IV SOLN
INTRAVENOUS | Status: DC | PRN
  Administered 2017-01-13 (×2): 5 mg via INTRAVENOUS

## 2017-01-13 MED ORDER — PHENYLEPHRINE HCL 10 MG/ML IJ SOLN
INTRAVENOUS | Status: DC | PRN
  Administered 2017-01-13: 50 ug/min via INTRAVENOUS

## 2017-01-13 MED ORDER — BUPIVACAINE-EPINEPHRINE 0.25% -1:200000 IJ SOLN
INTRAMUSCULAR | Status: DC | PRN
  Administered 2017-01-13: 20 mL

## 2017-01-13 MED ORDER — GABAPENTIN 300 MG PO CAPS
300.0000 mg | ORAL_CAPSULE | ORAL | Status: AC
  Administered 2017-01-13: 300 mg via ORAL
  Filled 2017-01-13: qty 1

## 2017-01-13 MED ORDER — METHYLENE BLUE 0.5 % INJ SOLN
INTRAVENOUS | Status: AC
Start: 2017-01-13 — End: 2017-01-13
  Filled 2017-01-13: qty 10

## 2017-01-13 MED ORDER — GLYCOPYRROLATE 0.2 MG/ML IJ SOLN
0.2000 mg | Freq: Once | INTRAMUSCULAR | Status: AC
  Administered 2017-01-13: 0.2 mg via INTRAVENOUS
  Filled 2017-01-13: qty 1

## 2017-01-13 MED ORDER — PROPOFOL 500 MG/50ML IV EMUL
INTRAVENOUS | Status: AC
  Filled 2017-01-13: qty 50

## 2017-01-13 MED ORDER — SODIUM CHLORIDE 0.9 % IJ SOLN
INTRAVENOUS | Status: DC | PRN
  Administered 2017-01-13: 1 mL via INTRADERMAL

## 2017-01-13 MED ORDER — MIDAZOLAM HCL 2 MG/2ML IJ SOLN
INTRAMUSCULAR | Status: AC
  Administered 2017-01-13: 2 mg via INTRAVENOUS
  Filled 2017-01-13: qty 2

## 2017-01-13 MED ORDER — HYDROCODONE-ACETAMINOPHEN 5-325 MG PO TABS: 1.0000 | ORAL_TABLET | Freq: Four times a day (QID) | ORAL | 0 refills | Status: DC | PRN

## 2017-01-13 MED ORDER — LACTATED RINGERS IV SOLN
INTRAVENOUS | Status: DC
  Administered 2017-01-13: 50 mL/h via INTRAVENOUS
  Administered 2017-01-13: 15:00:00 via INTRAVENOUS

## 2017-01-13 MED ORDER — PROPOFOL 10 MG/ML IV BOLUS
INTRAVENOUS | Status: DC | PRN
  Administered 2017-01-13: 100 mg via INTRAVENOUS

## 2017-01-13 SURGICAL SUPPLY — 49 items
ADH SKN CLS APL DERMABOND .7 (GAUZE/BANDAGES/DRESSINGS) ×2
BINDER BREAST XLRG (GAUZE/BANDAGES/DRESSINGS) ×1 IMPLANT
BLADE SURG 15 STRL LF DISP TIS (BLADE) ×1 IMPLANT
BLADE SURG 15 STRL SS (BLADE) ×2
CANISTER SUCT 3000ML PPV (MISCELLANEOUS) ×2 IMPLANT
CHLORAPREP W/TINT 26ML (MISCELLANEOUS) ×2 IMPLANT
CLIP TI WIDE RED SMALL 6 (CLIP) ×1 IMPLANT
CLIP VESOCCLUDE SM WIDE 6/CT (CLIP) ×2 IMPLANT
CONT SPECI 4OZ STER CLIK (MISCELLANEOUS) ×2 IMPLANT
COVER PROBE W GEL 5X96 (DRAPES) ×2 IMPLANT
COVER SURGICAL LIGHT HANDLE (MISCELLANEOUS) ×2 IMPLANT
DERMABOND ADVANCED (GAUZE/BANDAGES/DRESSINGS) ×2
DERMABOND ADVANCED .7 DNX12 (GAUZE/BANDAGES/DRESSINGS) ×1 IMPLANT
DEVICE DUBIN SPECIMEN MAMMOGRA (MISCELLANEOUS) ×2 IMPLANT
DRAPE CHEST BREAST 15X10 FENES (DRAPES) ×2 IMPLANT
DRAPE UTILITY XL STRL (DRAPES) ×2 IMPLANT
DRSG PAD ABDOMINAL 8X10 ST (GAUZE/BANDAGES/DRESSINGS) ×1 IMPLANT
ELECT COATED BLADE 2.86 ST (ELECTRODE) ×2 IMPLANT
ELECT REM PT RETURN 9FT ADLT (ELECTROSURGICAL) ×2
ELECTRODE REM PT RTRN 9FT ADLT (ELECTROSURGICAL) ×1 IMPLANT
GAUZE SPONGE 4X4 12PLY STRL (GAUZE/BANDAGES/DRESSINGS) ×2 IMPLANT
GLOVE SURG SIGNA 7.5 PF LTX (GLOVE) ×4 IMPLANT
GOWN STRL REUS W/ TWL LRG LVL3 (GOWN DISPOSABLE) ×1 IMPLANT
GOWN STRL REUS W/ TWL XL LVL3 (GOWN DISPOSABLE) ×1 IMPLANT
GOWN STRL REUS W/TWL LRG LVL3 (GOWN DISPOSABLE) ×2
GOWN STRL REUS W/TWL XL LVL3 (GOWN DISPOSABLE) ×2
KIT BASIN OR (CUSTOM PROCEDURE TRAY) ×2 IMPLANT
KIT MARKER MARGIN INK (KITS) ×2 IMPLANT
NDL 18GX1X1/2 (RX/OR ONLY) (NEEDLE) IMPLANT
NDL FILTER BLUNT 18X1 1/2 (NEEDLE) IMPLANT
NDL HYPO 25GX1X1/2 BEV (NEEDLE) ×1 IMPLANT
NDL SAFETY ECLIPSE 18X1.5 (NEEDLE) IMPLANT
NEEDLE 18GX1X1/2 (RX/OR ONLY) (NEEDLE) ×2 IMPLANT
NEEDLE FILTER BLUNT 18X 1/2SAF (NEEDLE) ×1
NEEDLE FILTER BLUNT 18X1 1/2 (NEEDLE) ×1 IMPLANT
NEEDLE HYPO 18GX1.5 SHARP (NEEDLE)
NEEDLE HYPO 25GX1X1/2 BEV (NEEDLE) ×4 IMPLANT
NS IRRIG 1000ML POUR BTL (IV SOLUTION) ×2 IMPLANT
PACK SURGICAL SETUP 50X90 (CUSTOM PROCEDURE TRAY) ×2 IMPLANT
PENCIL BUTTON HOLSTER BLD 10FT (ELECTRODE) ×2 IMPLANT
SPONGE LAP 18X18 X RAY DECT (DISPOSABLE) ×3 IMPLANT
SUT MNCRL AB 4-0 PS2 18 (SUTURE) ×3 IMPLANT
SUT VIC AB 3-0 SH 8-18 (SUTURE) ×3 IMPLANT
SYR BULB 3OZ (MISCELLANEOUS) ×2 IMPLANT
SYR CONTROL 10ML LL (SYRINGE) ×2 IMPLANT
TOWEL OR 17X24 6PK STRL BLUE (TOWEL DISPOSABLE) ×2 IMPLANT
TOWEL OR 17X26 10 PK STRL BLUE (TOWEL DISPOSABLE) ×2 IMPLANT
TUBE CONNECTING 12X1/4 (SUCTIONS) ×2 IMPLANT
YANKAUER SUCT BULB TIP NO VENT (SUCTIONS) ×2 IMPLANT

## 2017-01-13 NOTE — Anesthesia Procedure Notes (Addendum)
Anesthesia Regional Block: Pectoralis block   Pre-Anesthetic Checklist: ,, timeout performed, Correct Patient, Correct Site, Correct Laterality, Correct Procedure, Correct Position, site marked, Risks and benefits discussed,  Surgical consent,  Pre-op evaluation,  At surgeon's request and post-op pain management  Laterality: Right  Prep: chloraprep       Needles:  Injection technique: Single-shot  Needle Type: Echogenic Stimulator Needle     Needle Length: 9cm  Needle Gauge: 21   Needle insertion depth: 6 cm   Additional Needles:   Procedures: ultrasound guided,,,,,,,,  Narrative:  Start time: 01/13/2017 1:05 PM End time: 01/13/2017 1:10 PM Injection made incrementally with aspirations every 5 mL.  Performed by: Personally  Anesthesiologist: Josephine Igo  Additional Notes: Timeout performed. Patient sedated. Relevant anatomy ID'd using [place]. Incremental 3-22ml injection with frequent aspiration. Patient tolerated procedure well.

## 2017-01-13 NOTE — Interval H&P Note (Signed)
History and Physical Interval Note:  01/13/2017 2:03 PM  [name]  has presented today for surgery, with the diagnosis of right breast cancer  The various methods of treatment have been discussed with the patient and family. Husband at bedside.  I confirmed 2 seeds (not 3) in right breast.  After consideration of risks, benefits and other options for treatment, the patient has consented to  Procedure(s) with comments: RIGHT BREAST RADIOACTIVE SEED X'S 2 GUIDED LUMPECTOMY WITH RADIOACTIVE SEED TARGETED AXILLARYLYMPH NODE EXCISION AND RIGHT AXILLARY SENTINEL LYMPH NODE BIOPSY (Right) - 2 SEEDS IN RIGHT BREAST 1 SEED IN RIGHT AXILLARY NODE as a surgical intervention .  The patient's history has been reviewed, patient examined, no change in status, stable for surgery.  I have reviewed the patient's chart and labs.  Questions were answered to the patient's satisfaction.     Noura Purpura H

## 2017-01-13 NOTE — Transfer of Care (Signed)
Immediate Anesthesia Transfer of Care Note  Patient: [name]  Procedure(s) Performed: Procedure(s) with comments: RIGHT BREAST RADIOACTIVE SEED X'S 2 GUIDED LUMPECTOMY WITH RADIOACTIVE SEED TARGETED AXILLARYLYMPH NODE EXCISION AND RIGHT AXILLARY SENTINEL LYMPH NODE BIOPSY (Right) - 2 SEEDS IN RIGHT BREAST 1 SEED IN RIGHT AXILLARY NODE  Patient Location: PACU  Anesthesia Type:GA combined with regional for post-op pain  Level of Consciousness: awake and alert   Airway & Oxygen Therapy: Patient Spontanous Breathing and Patient connected to face mask oxygen  Post-op Assessment: Report given to RN, Post -op Vital signs reviewed and stable and Patient moving all extremities X 4  Post vital signs: Reviewed and stable  Last Vitals:  Vitals:   01/13/17 1330 01/13/17 1712  BP: (!) 194/61 (!) (P) 194/103  Pulse: (!) 54 84  Resp: 17 13  Temp:  36.6 C    Last Pain:  Vitals:   01/13/17 1712  TempSrc:   PainSc: (P) 0-No pain      Patients Stated Pain Goal: 3 (22/57/50 5183)  Complications: No apparent anesthesia complications

## 2017-01-13 NOTE — Anesthesia Preprocedure Evaluation (Addendum)
Anesthesia Evaluation  Patient identified by MRN, date of birth, ID band Patient awake    Reviewed: Allergy & Precautions, NPO status , Patient's Chart, lab work & pertinent test results  History of Anesthesia Complications (+) PONV and history of anesthetic complications  Airway Mallampati: III  TM Distance: >3 FB Neck ROM: Full    Dental  (+) Teeth Intact, Caps   Pulmonary shortness of breath and with exertion, asthma , sleep apnea , pneumonia, resolved,    Pulmonary exam normal breath sounds clear to auscultation       Cardiovascular hypertension, Pt. on medications + Peripheral Vascular Disease  Normal cardiovascular exam Rhythm:Regular Rate:Normal     Neuro/Psych PSYCHIATRIC DISORDERS Depression Chemo induced neuropathy Hx/o Right Retinal vein occlusion S/P shunt  Neuromuscular disease    GI/Hepatic Neg liver ROS, GERD  Medicated and Controlled,  Endo/Other  diabetes, Well Controlled, Type 2, Oral Hypoglycemic AgentsMorbid obesityHyperlipidemia  Renal/GU negative Renal ROS  negative genitourinary   Musculoskeletal  (+) Arthritis ,   Abdominal (+) + obese,   Peds  Hematology negative hematology ROS (+)   Anesthesia Other Findings   Reproductive/Obstetrics Hx/o uterine Ca                            Anesthesia Physical Anesthesia Plan  ASA: III  Anesthesia Plan: General   Post-op Pain Management:  Regional for Post-op pain   Induction: Intravenous  PONV Risk Score and Plan: 4 or greater and Ondansetron, Dexamethasone, Midazolam, Scopolamine patch - Pre-op, Propofol infusion and Treatment may vary due to age or medical condition  Airway Management Planned: LMA  Additional Equipment:   Intra-op Plan:   Post-operative Plan: Extubation in OR  Informed Consent: I have reviewed the patients History and Physical, chart, labs and discussed the procedure including the risks,  benefits and alternatives for the proposed anesthesia with the patient or authorized representative who has indicated his/her understanding and acceptance.   Dental advisory given  Plan Discussed with: CRNA, Anesthesiologist and Surgeon  Anesthesia Plan Comments:         Anesthesia Quick Evaluation

## 2017-01-13 NOTE — Anesthesia Procedure Notes (Signed)
Procedure Name: LMA Insertion Date/Time: 01/13/2017 3:03 PM Performed by: Josephine Igo Pre-anesthesia Checklist: Patient identified, Emergency Drugs available, Suction available and Patient being monitored Patient Re-evaluated:Patient Re-evaluated prior to induction Oxygen Delivery Method: Circle system utilized Preoxygenation: Pre-oxygenation with 100% oxygen Induction Type: IV induction Ventilation: Nasal airway inserted- appropriate to patient size LMA: LMA inserted LMA Size: 4.0 Number of attempts: 1 Placement Confirmation: positive ETCO2 and breath sounds checked- equal and bilateral Tube secured with: Tape Dental Injury: Teeth and Oropharynx as per pre-operative assessment

## 2017-01-13 NOTE — Interval H&P Note (Signed)
History and Physical Interval Note:  01/13/2017 2:03 PM  [name]  has presented today for surgery, with the diagnosis of right breast cancer  The various methods of treatment have been discussed with the patient and family.  Husband at bedside.  I confirmed 2 (not 3) seeds in the right breast.  After consideration of risks, benefits and other options for treatment, the patient has consented to  Procedure(s) with comments: RIGHT BREAST RADIOACTIVE SEED X'S 2 GUIDED LUMPECTOMY WITH RADIOACTIVE SEED TARGETED AXILLARYLYMPH NODE EXCISION AND RIGHT AXILLARY SENTINEL LYMPH NODE BIOPSY (Right) - 2 SEEDS IN RIGHT BREAST 1 SEED IN RIGHT AXILLARY NODE as a surgical intervention .  The patient's history has been reviewed, patient examined, no change in status, stable for surgery.  I have reviewed the patient's chart and labs.  Questions were answered to the patient's satisfaction.     [name] H

## 2017-01-13 NOTE — Discharge Instructions (Signed)
[facility] - DISCHARGE INSTRUCTIONS TO PATIENT  Activity:  Driving - May drive in 2 or 3 days, if doing well   Lifting - No lifting more than 15 pounds for 1 week, then no limit  Wound Care:   Leave the bandage for 2 days, then removed the bandage and shower  Diet:  As tolerated.  Follow up appointment:  Call Dr. Pollie Friar office [facility]) at [phone] for an appointment in 2 to 3 weeks.  Medications and dosages:  Resume your home medications.  You have a prescription for:  Vicodin  Call Dr. Lucia Gaskins or his office  [phone]) if you have:  Temperature greater than 100.4,  Persistent nausea and vomiting,  Severe uncontrolled pain,  Redness, tenderness, or signs of infection (pain, swelling, redness, odor or green/yellow discharge around the site),  Difficulty breathing, headache or visual disturbances,  Any other questions or concerns you may have after discharge.  In an emergency, call 911 or go to an Emergency Department at a nearby hospital.

## 2017-01-13 NOTE — Op Note (Signed)
01/13/2017  5:04 PM  PATIENT:  [name] DOB: [birth year] MRN: [id number]  PREOP DIAGNOSIS:   right breast cancer  POSTOP DIAGNOSIS:    Right breast cancer, 12 and 4 o'clock position (T1, N1)  PROCEDURE:   Procedure(s): RIGHT BREAST RADIOACTIVE SEED X'S 2 GUIDED LUMPECTOMY (12 and 4 o'clock),  RIGHT AXILLARY SENTINEL LYMPH NODE BIOPSY, Injection of peri areolar area of breast with methylene blue (1.0 cc), deep sentinel lymph node biopsy  SURGEON:   Alphonsa Overall, M.D.  ANESTHESIA:   general  Anesthesiologist: Suzette Battiest, MD; Josephine Igo, MD CRNA: Collier Bullock, CRNA  General  EBL:  50  ml  DRAINS:  none   LOCAL MEDICATIONS USED:   20 cc of 1/4% marcaine, right pectoral block  SPECIMEN:   Right breast lumpectomy at 4 o'clock (6 color paint),  Right breast lumpectomy at 12 o'clock (6 color paint), Inferior margin of 12 o'clock lumpectomy biopsy cavity, right axillary sentinel lymph nodes excision (blue, counts - 1600, background 80)  COUNTS CORRECT:  YES  INDICATIONS FOR PROCEDURE:  [name] is a 71 y.o. (DOB: Jan 27, 1946) white female whose primary care physician is Biagio Borg, MD and comes for right breast lumpectomy x 2 and right axillary sentinel lymph node biopsy (they attempted to place a seed in the right axilla for a targeted lymph node excision, but they could not find the node).   She was originally seen in the [facility] on 02/02/2017 with Drs. Magrinat and  [place].  She was found to have pulmonary and mediastinal mets on CT scan.  She has undergone chemotherapy with response of her primary tumor. The options for breast cancer treatment have been discussed with the patient. She elected to proceed with lumpectomy and axillary sentinel lymph node.     The indications and potential complications of surgery were explained to the patient. Potential complications include, but are not limited to, bleeding, infection, the need for further  surgery, and nerve injury.     She had a I131 seed placed on 01/10/2017 in her right breast at [facility].  They could not find her previously biopsied right axillary node.  I confirmed the presence of two 131 seeds in the pre op area using the Neoprobe.  The seeds are in the 12 and 4 o'clock position of the right breast.   In the holding area, her right areola was injected with 1 millicurie of Technitium Sulfur Colloid.  OPERATIVE NOTE:   The patient was taken to room # 2 at [place] where she underwent a general anesthesia  supervised by Anesthesiologist: Suzette Battiest, MD; Josephine Igo, MD CRNA: Collier Bullock, CRNA. Her right breast and axilla were prepped with  ChloraPrep and sterilely draped.    A time-out and the surgical check list was reviewed.    I injected about 1.0 mL of 40% methylene blue around her right areola.   I turned attention to the two seeds which were at the 12 and 4 o'clock position of the right breast.   I used the Neoprobe to identify the I131 seed.  I tried to excise an area around the tumor of at least 1 cm.   I first excised the 4 o'clock area, then the 12 o'clock area.   I excised this block of breast tissue approximately 4 cm by 4 cm in diameter at the 4 o'clock position and 6 cm x 9 cm from the 12 o'clock position.Marland Kitchen  I painted each lumpectomy specimen with the 6 color paint kit and did a specimen mammograms which confirmed the mass, clip, and the seed in each of the specimens.  The specimens were sent to pathology who called back to confirm that they have the seed and the specimen.   I took an additional margin from the inferior aspect of the 12 o'clock biopsy sites.  I painted this and sent this to pathology.  I took this, because on the MRI of the breast - there appeared to be 3 nodules in a row in the 12 o'clock position - going approximately 7 cm below the primary cancer.   I then started the right deep axillary sentinel lymph node biopsy.  I made an incision in the right axilla.  I found a hot area at the junction of the breast and the pectoralis major muscle, deep in the axilla. I cut down and  identified a hot node that had counts of 1,600 and the background has 80 counts. The lymph node was blue. I checked her internal mammary nodes and supraclavicular nodes with the neoprobe and found no other hot area. The axillary node was then sent to pathology.    I then irrigated the wound with saline.  She had a pectoral block by anesthesia at the beginning of the case.   I infiltrated approximately 20 mL of 1/4% Marcaine between the incisions. I placed 4 clips to mark each biopsy cavity, at 12, 3, 6, and 9 o'clock.  I then closed all the wounds in layers using 3-0 Vicryl sutures for the deep layer. At the skin, I closed the incisions with a 4-0 Monocryl suture. The incisions were then painted with Dermabond.  She had gauze place over the wounds and placed in a breast binder.   The patient tolerated the procedure well, was transported to the recovery room in good condition. Sponge and needle count were correct at the end of the case.   Final pathology is pending.   Alphonsa Overall, MD, [facility] Pager: [phone] Office phone:  [phone]

## 2017-01-14 NOTE — Anesthesia Postprocedure Evaluation (Signed)
Anesthesia Post Note  Patient: [name]  Procedure(s) Performed: Procedure(s) (LRB): RIGHT BREAST RADIOACTIVE SEED X'S 2 GUIDED LUMPECTOMY WITH RADIOACTIVE SEED TARGETED AXILLARYLYMPH NODE EXCISION AND RIGHT AXILLARY SENTINEL LYMPH NODE BIOPSY (Right)     Patient location during evaluation: PACU Anesthesia Type: General Level of consciousness: awake and alert Pain management: pain level controlled Vital Signs Assessment: post-procedure vital signs reviewed and stable Respiratory status: spontaneous breathing, nonlabored ventilation, respiratory function stable and patient connected to nasal cannula oxygen Cardiovascular status: blood pressure returned to baseline and stable Postop Assessment: no signs of nausea or vomiting Anesthetic complications: no    Last Vitals:  Vitals:   01/13/17 1913 01/13/17 1922  BP: (!) 188/93 (!) 169/72  Pulse:  62  Resp:  12  Temp:  36.5 C    Last Pain:  Vitals:   01/13/17 1712  TempSrc:   PainSc: 0-No pain   Pain Goal: Patients Stated Pain Goal: 3 (01/13/17 1234)               Tiajuana Amass

## 2017-01-16 ENCOUNTER — Encounter (HOSPITAL_COMMUNITY): Payer: Self-pay | Admitting: Surgery

## 2017-01-23 ENCOUNTER — Other Ambulatory Visit: Payer: Self-pay | Admitting: *Deleted

## 2017-01-23 DIAGNOSIS — C50411 Malignant neoplasm of upper-outer quadrant of right female breast: Secondary | ICD-10-CM

## 2017-01-23 DIAGNOSIS — Z170 Estrogen receptor positive status [ER+]: Secondary | ICD-10-CM

## 2017-01-27 ENCOUNTER — Ambulatory Visit: Payer: Medicare Other | Admitting: Oncology

## 2017-01-31 ENCOUNTER — Ambulatory Visit (HOSPITAL_BASED_OUTPATIENT_CLINIC_OR_DEPARTMENT_OTHER): Payer: Medicare Other | Admitting: Oncology

## 2017-01-31 VITALS — BP 138/57 | HR 63 | Temp 98.1°F | Resp 18 | Ht 64.0 in | Wt 234.0 lb

## 2017-01-31 DIAGNOSIS — C50411 Malignant neoplasm of upper-outer quadrant of right female breast: Secondary | ICD-10-CM | POA: Diagnosis not present

## 2017-01-31 DIAGNOSIS — C773 Secondary and unspecified malignant neoplasm of axilla and upper limb lymph nodes: Secondary | ICD-10-CM

## 2017-01-31 DIAGNOSIS — C7801 Secondary malignant neoplasm of right lung: Secondary | ICD-10-CM

## 2017-01-31 DIAGNOSIS — Z170 Estrogen receptor positive status [ER+]: Secondary | ICD-10-CM | POA: Diagnosis not present

## 2017-01-31 DIAGNOSIS — R5383 Other fatigue: Secondary | ICD-10-CM | POA: Diagnosis not present

## 2017-01-31 DIAGNOSIS — C7802 Secondary malignant neoplasm of left lung: Secondary | ICD-10-CM

## 2017-01-31 DIAGNOSIS — C7800 Secondary malignant neoplasm of unspecified lung: Secondary | ICD-10-CM

## 2017-01-31 NOTE — Progress Notes (Signed)
[place]  [phone] [phone]) [phone]     ID: [name] DOB: 02-16-1946  MR#: [id number]  EXB#:[id number]  Patient Care Team: Biagio Borg, MD as PCP - Stevan Born, MD as Consulting Physician (General Surgery) Becca Bayne, Virgie Dad, MD as Consulting Physician (Oncology) Kyung Rudd, MD as Consulting Physician (Radiation Oncology) Bobbye Charleston, MD as Consulting Physician (Obstetrics and Gynecology) Nada Libman, MD as Referring Physician (Specialist) Vevelyn Royals, MD as Consulting Physician (Ophthalmology) Lamonte Sakai Rose Fillers, MD as Consulting Physician (Pulmonary Disease) OTHER MD:  CHIEF COMPLAINT: Estrogen receptor positive breast cancer  CURRENT TREATMENT: Letrozole, palbociclib   BREAST CANCER HISTORY: From the original intake note:  [name] had screening mammography showing some suspicious calcifications in the right breast leading to right diagnostic mammography with ultrasonography 01/22/2016 at [place]. The breast density was category C. In the upper right breast there was a 2.3 cm mass with additional masses measuring 0.9 and 0.7 cm. There was also a possible additional 0.8 mass in the lower inner quadrant. Ultrasound confirmed an irregular hypoechoic mass in the right breast upper outer quadrant measuring 2.0 cm. There were other masses measuring 0.7 and 0.8 cm by ultrasonography. The right axilla was sonographically benign.  Biopsy of a 12:00 and 4:00 mass in the right breast 01/22/2016 showed ([id number]) both specimens showing invasive ductal carcinoma, grade 1 or 2, both 95% estrogen receptor positive, both 95% progesterone receptor positive, both with strong staining intensity, with MIB-1 ranging from 10-15%, and both HER-2 negative, the signals ratio being 1.23-1.42, and the number per cell 1.85-2.59.  Her subsequent history is as detailed below  INTERVAL HISTORY: [name] returns today for follow-up of her stage IV  estrogen receptor positive breast cancer, which is also HER-2 positive. She continues on letrozole, with good tolerance.  Hot flashes and vaginal dryness are not a major issue. She never developed the arthralgias or myalgias that many patients can experience on this medication. She obtains it at a good price.  She is also on palbociclib, currently in the second week of the 21 day cycle. She feels more nausea and more fatigue on this medication now that she had to be started after a period off treatment. She tells me she can finish this cycle low but she is very fatigued.  Since her last visit here she underwent right lumpectomy 2, on 08/03/2018urrent ([id number]). The larger mass measured 0.9 cm, it was an invasive ductal carcinoma, grade 1, estrogen receptor 100% positive, progesterone receptor 10% positive, and HER-2 negative, with a signals ratio of 1.46 and the number per cell 2.70.  The smaller mass measured 0.5 cm. It was estrogen receptor 90% positive, with strong staining intensity, are just receptor 50% positive, with moderate staining intensity, and HER-2 negative,, with similar ratios and numbers.  She had a total of 5 lymph nodes removed, of which 3 were involved by tumor. Margins were clear although close  Her case was discussed at the multidisciplinary breast cancer conference and it was felt the patient should proceed to adjuvant radiation to optimize long-term local control. She had questions regarding full axillary dissection and we discussed that as well. She understands that would not improve survival and could at significant morbidity without necessarily adding to the local control issue.   REVIEW OF SYSTEMS: [place] tells me she is now on fewer eyedrops and is really trying to get off most medicines except to once she gets for breast cancer. She is more fatigued as  stated above and also a little bit more nauseated than usual. This bothers her and she is inpatient to feel better. She  had some pain from the surgery but that has greatly abated. A detailed review of systems today was otherwise stable  PAST MEDICAL HISTORY: Past Medical History:  Diagnosis Date  . Breast cancer ([place])   . Cancer ([place]) 02/2016   right breast  . DIABETES MELLITUS, TYPE II 01/04/2007   only takes actoplus daily  . Dizziness and giddiness 02/29/2008  . DVT, HX OF    at age 12 in right buttocks  . Dyspnea    due to lung cancer  . Family history of breast cancer   . GERD 01/04/2007   pt reports resolved   . GLAUCOMA 07/30/2008   both eyes  . History of blood transfusion    no abnormal  reaction  . History of uterine cancer 2000   hysterectomy done  . HYPERLIPIDEMIA 01/04/2007   taking Pravastatin daily  . HYPERTENSION 01/04/2007   takes Lisinopril daily  . Joint pain   . Joint swelling   . Leg cramps   . LEG PAIN, LEFT 07/06/2007  . NUMBNESS 07/30/2008   in fingers;pt states from Diamox  . OSTEOARTHRITIS, HIP 09/25/2009  . OTITIS MEDIA, ACUTE, BILATERAL 02/29/2008  . Overweight(278.02) 01/04/2007  . Peripheral vascular disease ([place])   . Pneumonia   . PONV (postoperative nausea and vomiting)   . SLEEP APNEA, OBSTRUCTIVE    doesn't use a cpap;study done about 61yr ago  . TRANSIENT ISCHEMIC ATTACK, HX OF 01/04/2007  . Vision loss    left eye    PAST SURGICAL HISTORY: Past Surgical History:  Procedure Laterality Date  . ABDOMINAL HYSTERECTOMY  2000  . BREAST LUMPECTOMY WITH RADIOACTIVE SEED AND SENTINEL LYMPH NODE BIOPSY Right 01/13/2017   Procedure: RIGHT BREAST RADIOACTIVE SEED X'S 2 GUIDED LUMPECTOMY WITH RADIOACTIVE SEED TARGETED AXILLARYLYMPH NODE EXCISION AND RIGHT AXILLARY SENTINEL LYMPH NODE BIOPSY;  Surgeon: NAlphonsa Overall MD;  [facility]  Service: General;  Laterality: Right;  2 SEEDS IN RIGHT BREAST 1 SEED IN RIGHT AXILLARY NODE  . CHOLECYSTECTOMY    . ENDOBRONCHIAL ULTRASOUND Bilateral 03/28/2016   Procedure: ENDOBRONCHIAL ULTRASOUND;  Surgeon: RCollene Gobble MD;   Location: [facility];  Service: Cardiopulmonary;  Laterality: Bilateral;  . EYE SURGERY  13   shunt left and lazer eye surgery on right cataract and retenia tear with repair  . growth removal  2004   from thumb  . KNEE ARTHROSCOPY Right   . mulitple eye surgeries     both eyes, cataracts with ioc done both eyes  . OOPHORECTOMY    . TOTAL HIP ARTHROPLASTY  06/24/2011   Procedure: TOTAL HIP ARTHROPLASTY;  Surgeon: FKerin Salen  [facility]  Service: Orthopedics;  Laterality: Right;  . TOTAL HIP ARTHROPLASTY Left 11/12/2012   Dr RMayer Camel . TOTAL HIP ARTHROPLASTY Left 11/12/2012   Procedure: TOTAL HIP ARTHROPLASTY;  Surgeon: FKerin Salen MD;  [facility]  Service: Orthopedics;  Laterality: Left;  DEPUY PINNACLE    FAMILY HISTORY Family History  Problem Relation Age of Onset  . Dementia Mother   . Cancer Mother        Breast and lung cancer  . Stroke Sister   . Breast cancer Sister 590 . Heart attack Maternal Aunt   . Lung cancer Maternal Grandmother        non smoker  . Glaucoma Maternal Grandfather   .  Anesthesia problems Neg Hx   The patient's father died at age 47, the patient's mother died at age 26. She had breast and lung cancers diagnosed shortly before her death. The patient had no brothers, 2 sisters. One sister was diagnosed with breast cancer at the age of 82.  GYNECOLOGIC HISTORY:  No LMP recorded. Patient has had a hysterectomy. Menarche age 6, first live birth age 10, the patient is GX P1. She had a hysterectomy for endometrial cancer in the year 2000. She did not take hormone replacement. She did use oral contraceptives for your than 20 years remotely, with no complications.  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 She is home with [name]. He is a retired Dealer.Their son [name] also lives in [place].    ADVANCED DIRECTIVES: In place  HEALTH  MAINTENANCE: Social History  Substance Use Topics  . Smoking status: Never Smoker  . Smokeless tobacco: Never Used  . Alcohol use No     Colonoscopy: Never  PAP: Status post hysterectomy  Bone density: Remote   Allergies  Allergen Reactions  . Codeine Hives    Hycodan syrup  . Fluorescein Nausea And Vomiting    ? IV dye for retina specialist  . Lipitor [Atorvastatin Calcium]     Leg cramp  . Oxycodone Nausea And Vomiting    Patient vomited for 3 days after taking  . Sitagliptin Phosphate Nausea And Vomiting  . Sulfa Drugs Cross Reactors Nausea And Vomiting    Current Outpatient Prescriptions  Medication Sig Dispense Refill  . acetaminophen (TYLENOL) 500 MG tablet Take 1,000 mg by mouth every 4 (four) hours as needed for moderate pain or fever.    Marland Kitchen aspirin EC 81 MG tablet Take 81 mg by mouth daily at 6 PM. 1700    . bimatoprost (LUMIGAN) 0.01 % SOLN Place 1 drop into both eyes at bedtime.    . brimonidine (ALPHAGAN) 0.2 % ophthalmic solution Place 1 drop into both eyes 3 (three) times daily.    . brimonidine-timolol (COMBIGAN) 0.2-0.5 % ophthalmic solution Place 1 drop into both eyes three times daily    . cholecalciferol (VITAMIN D) 1000 units tablet Take 1,000 Units by mouth daily.    . dorzolamide-timolol (COSOPT) 22.3-6.8 MG/ML ophthalmic solution Place 1 drop into both eyes 2 (two) times daily.    Marland Kitchen doxycycline (MONODOX) 50 MG capsule Take 50 mg by mouth daily at 6 PM. 1700    . HYDROcodone-acetaminophen (NORCO/VICODIN) 5-325 MG tablet Take 1-2 tablets by mouth every 6 (six) hours as needed for moderate pain. 20 tablet 0  . letrozole (FEMARA) 2.5 MG tablet Take 1 tablet (2.5 mg total) by mouth at bedtime. (Patient taking differently: Take 2.5 mg by mouth daily at 6 PM. 1700)    . lisinopril (PRINIVIL,ZESTRIL) 20 MG tablet Take 1 tablet (20 mg total) by mouth daily. (Patient taking differently: Take 20 mg by mouth daily at 6 PM. 1700) 90 tablet 3  . lovastatin (MEVACOR) 20  MG tablet Take 1 tablet (20 mg total) by mouth every evening. (Patient taking differently: Take 20 mg by mouth daily at 6 PM. 1700) 90 tablet 3  . moxifloxacin (VIGAMOX) 0.5 % ophthalmic solution Place 1 drop into the right eye 3 (three) times daily.    Marland Kitchen neomycin-polymyxin-dexameth (MAXITROL) 0.1 % OINT Place 1 application into the right eye at bedtime.    . palbociclib (  IBRANCE) 100 MG capsule Take 1 capsule (100 mg total) by mouth daily with breakfast. Take whole with food. 21 capsule 6  . pioglitazone-metformin (ACTOPLUS MET) 15-500 MG tablet Take 15-500 tablets by mouth daily. (Patient taking differently: Take 1 tablet by mouth daily at 6 PM. 1700) 90 tablet 3  . prednisoLONE acetate (PRED FORTE) 1 % ophthalmic suspension Place 1 drop into the right eye 4 (four) times daily.     No current facility-administered medications for this visit.      OBJECTIVE: Middle-aged white woman in no acute distress  Vitals:   01/31/17 1600  BP: (!) 138/57  Pulse: 63  Resp: 18  Temp: 98.1 F (36.7 C)  SpO2: 99%     Body mass index is 40.17 kg/m.    ECOG FS:1 - Symptomatic but completely ambulatory Filed Weights   01/31/17 1600  Weight: 234 lb (106.1 kg)    Sclerae unicteric, pupils round and equal Oropharynx clear and moist No cervical or supraclavicular adenopathy Lungs no rales or rhonchi Heart regular rate and rhythm Abd soft, nontender, positive bowel sounds MSK no focal spinal tenderness, no upper extremity lymphedema Neuro: nonfocal, well oriented, appropriate affect Breasts: The right breast is status post lumpectomy. The incisions are healing nicely. There is no erythema, swelling, or dehiscence. The cosmetic result is good. Left breast is unremarkable. Both axillae are benign.   LAB RESULTS:  CMP     Component Value Date/Time   NA 142 01/03/2017 1241   K 4.1 01/03/2017 1241   CL 107 07/23/2016 0550   CO2 27 01/03/2017 1241   GLUCOSE 128 01/03/2017 1241   BUN 11.8  01/03/2017 1241   CREATININE 0.8 01/03/2017 1241   CALCIUM 9.4 01/03/2017 1241   PROT 6.7 01/03/2017 1241   ALBUMIN 3.6 01/03/2017 1241   AST 10 01/03/2017 1241   ALT 14 01/03/2017 1241   ALKPHOS 62 01/03/2017 1241   BILITOT 0.39 01/03/2017 1241   GFRNONAA >60 07/23/2016 0550   GFRAA >60 07/23/2016 0550    INo results found for: SPEP, UPEP  Lab Results  Component Value Date   WBC 5.2 01/03/2017   NEUTROABS 2.9 01/03/2017   HGB 11.6 01/03/2017   HCT 34.9 01/03/2017   MCV 92.0 01/03/2017   PLT 305 01/03/2017      Chemistry      Component Value Date/Time   NA 142 01/03/2017 1241   K 4.1 01/03/2017 1241   CL 107 07/23/2016 0550   CO2 27 01/03/2017 1241   BUN 11.8 01/03/2017 1241   CREATININE 0.8 01/03/2017 1241      Component Value Date/Time   CALCIUM 9.4 01/03/2017 1241   ALKPHOS 62 01/03/2017 1241   AST 10 01/03/2017 1241   ALT 14 01/03/2017 1241   BILITOT 0.39 01/03/2017 1241       No results found for: LABCA2  No components found for: LABCA125  No results for input(s): INR in the last 168 hours.  Urinalysis    Component Value Date/Time   COLORURINE YELLOW 07/22/2016 1132   APPEARANCEUR HAZY (A) 07/22/2016 1132   LABSPEC 1.013 07/22/2016 1132   PHURINE 5.0 07/22/2016 1132   GLUCOSEU NEGATIVE 07/22/2016 1132   GLUCOSEU NEGATIVE 10/30/2014 1513   HGBUR MODERATE (A) 07/22/2016 1132   BILIRUBINUR NEGATIVE 07/22/2016 1132   KETONESUR 5 (A) 07/22/2016 1132   PROTEINUR NEGATIVE 07/22/2016 1132   UROBILINOGEN 0.2 10/30/2014 1513   NITRITE NEGATIVE 07/22/2016 1132   LEUKOCYTESUR LARGE (A) 07/22/2016 1132  STUDIES: Mm Breast Surgical Specimen  Result Date: 01/13/2017 CLINICAL DATA:  Radioactive seed localization was performed of 2 areas in the right breast prior to lumpectomies. This describes the excisional biopsy/lumpectomy of the ribbon shaped biopsy clip in the superior and slightly outer right breast. EXAM: SPECIMEN RADIOGRAPH OF THE RIGHT BREAST  COMPARISON:  Previous exam(s). FINDINGS: Status post excision of the right breast. The radioactive seed and ribbon shaped biopsy marker clip are present, completely intact, and were marked for pathology. IMPRESSION: Specimen radiograph of the right breast. Electronically Signed   By: Curlene Dolphin M.D.   On: 01/13/2017 16:16   Mm Breast Surgical Specimen  Result Date: 01/13/2017 CLINICAL DATA:  The patient had 2 radioactive seeds placed in the right breast on January 10, 2017. This report describes the excisional biopsy specimen of the coil shaped biopsy clip in the medial right breast. EXAM: SPECIMEN RADIOGRAPH OF THE RIGHT BREAST COMPARISON:  Previous exam(s). FINDINGS: Status post excision of the right breast. The radioactive seed and coil shaped biopsy marker clip are present, completely intact, and were marked for pathology. IMPRESSION: Specimen radiograph of the right breast. Electronically Signed   By: Curlene Dolphin M.D.   On: 01/13/2017 15:55   Mm Rt Radioactive Seed Loc Mammo Guide  Result Date: 01/10/2017 CLINICAL DATA:  Patient presents for radioactive seed localization of 2 right breast carcinomas, following neoadjuvant chemotherapy and prior to surgical excision. EXAM: MAMMOGRAPHIC GUIDED RADIOACTIVE SEED LOCALIZATION OF THE RIGHT BREAST COMPARISON:  Previous exam(s). FINDINGS: Patient presents for radioactive seed localization prior to surgical excision of 2 previously biopsied right breast carcinomas, 1 at 12 o'clock and at the other 4 o'clock. I met with the patient and we discussed the procedure of seed localization including benefits and alternatives. We discussed the high likelihood of a successful procedure. We discussed the risks of the procedure including infection, bleeding, tissue injury and further surgery. We discussed the low dose of radioactivity involved in the procedure. Informed, written consent was given. The usual time-out protocol was performed immediately prior to the  procedure. Seed 1, ribbon clip, 12 oclock. Using mammographic guidance, sterile technique, 1% lidocaine and an I-125 radioactive seed, the 12 o'clock position ribbon shaped biopsy clip was localized using a cranial to caudal approach. Seed 2, coil clip, 4 oclock. Using mammographic guidance, sterile technique, 1% lidocaine and an I-125 radioactive seed, the 4 o'clock position coil shaped biopsy clip was localized using a cranial to caudal approach. The follow-up mammogram images confirm both seeds in the expected location and were marked for Dr. Lucia Gaskins. Follow-up survey of the patient confirms presence of the radioactive seeds. Order number of I-125 seed: Seed 1: [id number]. Seed 2: [id number]. Total activity: Seed 1: 1.443 millicuries Seed 2: 1.540 millicuries Reference Date: Seed 1: 12/29/2016. Seed 2: 12/02/2016. The patient tolerated the procedure well and was released from the [facility]. She was given instructions regarding seed removal. IMPRESSION: Radioactive seed localization 2 right breast carcinoma is breast. No apparent complications. Patient was also to have a positive right axillary lymph node localized with a radioactive seed. The [facility] biopsy clip could not be visualized mammographically or on ultrasound to allow seed localization. I notified Dr. Lucia Gaskins by phone at the time of this dictation that the axillary lymph node could not be localized. Electronically Signed   By: Lajean Manes M.D.   On: 01/10/2017 15:16   Mm Rt Radio Seed Ea Add Lesion Loc Mammo  Result Date: 01/10/2017 CLINICAL DATA:  Patient presents  for radioactive seed localization of 2 right breast carcinomas, following neoadjuvant chemotherapy and prior to surgical excision. EXAM: MAMMOGRAPHIC GUIDED RADIOACTIVE SEED LOCALIZATION OF THE RIGHT BREAST COMPARISON:  Previous exam(s). FINDINGS: Patient presents for radioactive seed localization prior to surgical excision of 2 previously biopsied right breast carcinomas, 1 at 12  o'clock and at the other 4 o'clock. I met with the patient and we discussed the procedure of seed localization including benefits and alternatives. We discussed the high likelihood of a successful procedure. We discussed the risks of the procedure including infection, bleeding, tissue injury and further surgery. We discussed the low dose of radioactivity involved in the procedure. Informed, written consent was given. The usual time-out protocol was performed immediately prior to the procedure. Seed 1, ribbon clip, 12 oclock. Using mammographic guidance, sterile technique, 1% lidocaine and an I-125 radioactive seed, the 12 o'clock position ribbon shaped biopsy clip was localized using a cranial to caudal approach. Seed 2, coil clip, 4 oclock. Using mammographic guidance, sterile technique, 1% lidocaine and an I-125 radioactive seed, the 4 o'clock position coil shaped biopsy clip was localized using a cranial to caudal approach. The follow-up mammogram images confirm both seeds in the expected location and were marked for Dr. Lucia Gaskins. Follow-up survey of the patient confirms presence of the radioactive seeds. Order number of I-125 seed: Seed 1: [id number]. Seed 2: [id number]. Total activity: Seed 1: 4.235 millicuries Seed 2: 3.614 millicuries Reference Date: Seed 1: 12/29/2016. Seed 2: 12/02/2016. The patient tolerated the procedure well and was released from the Breast Center. She was given instructions regarding seed removal. IMPRESSION: Radioactive seed localization 2 right breast carcinoma is breast. No apparent complications. Patient was also to have a positive right axillary lymph node localized with a radioactive seed. The [facility] biopsy clip could not be visualized mammographically or on ultrasound to allow seed localization. I notified Dr. Lucia Gaskins by phone at the time of this dictation that the axillary lymph node could not be localized. Electronically Signed   By: Lajean Manes M.D.   On: 01/10/2017 15:16    ELIGIBLE FOR AVAILABLE RESEARCH PROTOCOL: Multiple, as noted below  ASSESSMENT: 71 y.o. [place] woman with a remote history of early stage endometrial cancer, now status post right breast upper outer quadrant biopsy 01/22/2016 for a clinically multifocal T2 N0, stage 2A invasive ductal carcinoma, grade 1, estrogen and progesterone receptor positive, HER-2 negative, with an MIB-1 between 10 and 15%.  (1) right axillary lymph node biopsy 03/02/2016 positive  (2) genetics testing 01/13/2016 through the Custom gene panel offered by [facility] found no deleterious mutations in  ATM, BARD1, BRCA1, BRCA2, BRIP1, CDH1, CHEK2, EPCAM, FANCC, MLH1, MSH2, MSH6, MUTYH, NBN, PALB2, PMS2, POLD1, PTEN, RAD51C, RAD51D, TP53, and XRCC2  METASTATIC DISEASE: OCT 2017 (3) CT scans of the chest abdomen and pelvis obtained 03/10/2016 are consistent with bilateral lung metastases and mediastinal and hilar nodal involvement, but no liver or bone spread  (a) bronchoscopic lymph node biopsy 2 (station 7, 13R) 03/28/2016 confirms metastatic adenocarcinoma, estrogen receptor positive, HER-2 not amplified  (b) baseline CA-27-29 on 04/18/2016 was 137.5.  (4) letrozole started 03/15/2016, palbociclib added 03/29/2016 at 125 mg/day, 21/7  (a) dose decreased to 100 mg per day, 21/7, beginning with February cycle  (b) palbociclib held 01/31/2017, with increasing symptoms  (5) status post double right lumpectomies and right axillary lymph node sampling 01/13/2017 for 2 separate invasive ductal carcinoma lesions, pT1a and pT1b, N1a, with negative margins, both lesions being estrogen and progesterone receptor  positive and HER-2 negative  (6) adjuvant radiation to follow   PLAN: I spent approximately 30 minutes with Inez Catalina with most of that time spent discussing her complex problems. We discussed her surgical pathology in detail. She understands she has had a very good response to treatment although it did not clear her  lymph nodes. We discussed the fact that we use to do complete axillary lymph node dissections but we have found that that does not improve survival and clearly would not in her case. It would put her at risk of severe permanent morbidity. Instead when we discussed her case in conference we felt proceeding to adjuvant radiation would be appropriate. It gives her the best chance of long-term local control which is the goal here.  Of course she still has metastatic disease. This is well-controlled on present and I we are continuing her letrozole. However I am going to hold her palbociclib when she completes this cycle, which will be a 20 01/30/2017. She can then proceed to radiation which will cause her to be fatigued as well. Once she recovers from the radiation, most likely in November or so, we can resume the palbociclib.  She is very fatigued at present but I have encouraged her to do her very best to get out of the chair and start taking walks. That is the best way to get over the fatigue that she is experiencing and the one that she is going to experience from radiation  I'm going to see her again the first week in November. We should be able to resume palbociclib then. My goal is that by January 1 she will be as close to normal as possible unstable drugs with continuing evidence of disease control.   Chauncey Cruel, MD   01/31/2017 4:31 PM Medical Oncology and Hematology [facility] [address] [place], [place] [address] Tel. [phone]    Fax. [phone]

## 2017-02-02 ENCOUNTER — Ambulatory Visit
Admission: RE | Admit: 2017-02-02 | Discharge: 2017-02-02 | Disposition: A | Discharge status: Home / Self Care | Payer: Medicare Other | Source: Ambulatory Visit | Attending: Radiation Oncology | Admitting: Radiation Oncology

## 2017-02-02 ENCOUNTER — Ambulatory Visit: Admission: RE | Admit: 2017-02-02 | Payer: Medicare Other | Source: Ambulatory Visit | Admitting: Radiation Oncology

## 2017-02-02 ENCOUNTER — Other Ambulatory Visit: Payer: Self-pay | Admitting: Emergency Medicine

## 2017-02-02 DIAGNOSIS — Z170 Estrogen receptor positive status [ER+]: Secondary | ICD-10-CM

## 2017-02-02 DIAGNOSIS — C50411 Malignant neoplasm of upper-outer quadrant of right female breast: Secondary | ICD-10-CM

## 2017-02-02 NOTE — Progress Notes (Signed)
[name] came in this moring for her appointment but,she was sick so she decided to reschedule.

## 2017-02-03 DIAGNOSIS — T814XXA Infection following a procedure, initial encounter: Secondary | ICD-10-CM | POA: Diagnosis not present

## 2017-02-06 ENCOUNTER — Telehealth: Payer: Self-pay | Admitting: *Deleted

## 2017-02-06 MED ORDER — PROCHLORPERAZINE MALEATE 10 MG PO TABS: 10.0000 mg | ORAL_TABLET | Freq: Four times a day (QID) | ORAL | 0 refills | Status: DC | PRN

## 2017-02-06 NOTE — Telephone Encounter (Signed)
This RN spoke with pt per her call stating need for refill for her nausea med .  Per discussion - [name] states she started "feeling bad after I saw Dr Jana Hakim  - almost flu like with vomiting and not able to eat"  [name] states she developed fevers " up to 103.5 so on Friday I went to the surgeon's "   Pt was seen by Dr Excell Seltzer due to Dr Lucia Gaskins out of the office.  [name] states  Dr Excell Seltzer opened her wound and drained it and put her on an antibiotic.  She is scheduled to return to him tomorrow for follow up.  Per discussion - she states she is drinking " very well " with good urinary output. She has very little appetite.  She vomits intermittantly through out the day - which seems to be aggravated by smells.  She was using compazine " but it is gross " and with further inquiry she states it causes a bad taste in her mouth.  She says it stops the vomiting " but then the vomiting returns ".  This RN contacted pt's pharmacy and gave prescription for phenergan - and verified antibiotic as Augmentin which she started 3 days ago.  Per above- prescription for phenergan given only.  This RN then received call from [place] PA at [place] - per above - stating outcome of culture obtained need to change antibiotic to levaquin.  Outcome is this office called in phenergan for her nausea and vomiting - and surgeon's changed augmentin to levaquin.  No other needs at this time.

## 2017-02-07 DIAGNOSIS — T814XXA Infection following a procedure, initial encounter: Secondary | ICD-10-CM | POA: Diagnosis not present

## 2017-02-09 ENCOUNTER — Ambulatory Visit: Admission: RE | Admit: 2017-02-09 | Payer: Medicare Other | Source: Ambulatory Visit | Admitting: Radiation Oncology

## 2017-02-09 ENCOUNTER — Ambulatory Visit: Payer: Medicare Other | Admitting: Radiation Oncology

## 2017-02-09 ENCOUNTER — Ambulatory Visit: Payer: Medicare Other

## 2017-02-15 ENCOUNTER — Telehealth: Payer: Self-pay

## 2017-02-15 ENCOUNTER — Telehealth: Payer: Self-pay | Admitting: Pharmacy Technician

## 2017-02-15 NOTE — Telephone Encounter (Signed)
Spoke with pt by phone informing her of appt's made for lab and to see [name] on 9/10 starting at 0830.  Pt advised to arrive by 0815

## 2017-02-15 NOTE — Telephone Encounter (Signed)
Oral Oncology Patient Advocate Encounter  Received a message from the patient with concerns about her existing copayment assistance.  She has received a letter in the mail from a copay assistance foundation that she is in jeopardy of losing her grant if there continues to be no activity on her account.  She is bringing in the letter on Monday 02/20/2017 and I will work with her to figure out next steps.    Fabio Asa. [name], [employer] Patient [place] [phone] 02/15/2017 3:28 PM

## 2017-02-20 ENCOUNTER — Telehealth: Payer: Self-pay | Admitting: Adult Health

## 2017-02-20 ENCOUNTER — Telehealth: Payer: Self-pay | Admitting: *Deleted

## 2017-02-20 ENCOUNTER — Ambulatory Visit (HOSPITAL_BASED_OUTPATIENT_CLINIC_OR_DEPARTMENT_OTHER): Payer: Medicare Other | Admitting: Adult Health

## 2017-02-20 ENCOUNTER — Other Ambulatory Visit (HOSPITAL_BASED_OUTPATIENT_CLINIC_OR_DEPARTMENT_OTHER): Payer: Medicare Other

## 2017-02-20 VITALS — BP 131/51 | HR 60 | Temp 97.9°F | Resp 18 | Wt 233.4 lb

## 2017-02-20 DIAGNOSIS — C773 Secondary and unspecified malignant neoplasm of axilla and upper limb lymph nodes: Secondary | ICD-10-CM

## 2017-02-20 DIAGNOSIS — C50411 Malignant neoplasm of upper-outer quadrant of right female breast: Secondary | ICD-10-CM | POA: Diagnosis not present

## 2017-02-20 DIAGNOSIS — Z170 Estrogen receptor positive status [ER+]: Secondary | ICD-10-CM

## 2017-02-20 DIAGNOSIS — C7801 Secondary malignant neoplasm of right lung: Secondary | ICD-10-CM

## 2017-02-20 DIAGNOSIS — C7800 Secondary malignant neoplasm of unspecified lung: Secondary | ICD-10-CM

## 2017-02-20 DIAGNOSIS — Z8542 Personal history of malignant neoplasm of other parts of uterus: Secondary | ICD-10-CM

## 2017-02-20 DIAGNOSIS — K760 Fatty (change of) liver, not elsewhere classified: Secondary | ICD-10-CM

## 2017-02-20 DIAGNOSIS — C7802 Secondary malignant neoplasm of left lung: Secondary | ICD-10-CM

## 2017-02-20 LAB — CBC WITH DIFFERENTIAL/PLATELET
BASO%: 0.3 % (ref 0.0–2.0)
Basophils Absolute: 0 10*3/uL (ref 0.0–0.1)
EOS%: 2.6 % (ref 0.0–7.0)
Eosinophils Absolute: 0.1 10*3/uL (ref 0.0–0.5)
HCT: 34.5 % — ABNORMAL LOW (ref 34.8–46.6)
HGB: 11.2 g/dL — ABNORMAL LOW (ref 11.6–15.9)
LYMPH%: 35.2 % (ref 14.0–49.7)
MCH: 29.3 pg (ref 25.1–34.0)
MCHC: 32.5 g/dL (ref 31.5–36.0)
MCV: 90.3 fL (ref 79.5–101.0)
MONO#: 0.4 10*3/uL (ref 0.1–0.9)
MONO%: 10.7 % (ref 0.0–14.0)
NEUT#: 2 10*3/uL (ref 1.5–6.5)
NEUT%: 51.2 % (ref 38.4–76.8)
Platelets: 244 10*3/uL (ref 145–400)
RBC: 3.82 10*6/uL (ref 3.70–5.45)
RDW: 16.2 % — ABNORMAL HIGH (ref 11.2–14.5)
WBC: 3.8 10*3/uL — ABNORMAL LOW (ref 3.9–10.3)
lymph#: 1.4 10*3/uL (ref 0.9–3.3)

## 2017-02-20 LAB — COMPREHENSIVE METABOLIC PANEL
ALT: 18 U/L (ref 0–55)
AST: 14 U/L (ref 5–34)
Albumin: 3.2 g/dL — ABNORMAL LOW (ref 3.5–5.0)
Alkaline Phosphatase: 55 U/L (ref 40–150)
Anion Gap: 10 mEq/L (ref 3–11)
BUN: 8.9 mg/dL (ref 7.0–26.0)
CO2: 23 mEq/L (ref 22–29)
Calcium: 9.1 mg/dL (ref 8.4–10.4)
Chloride: 109 mEq/L (ref 98–109)
Creatinine: 0.8 mg/dL (ref 0.6–1.1)
EGFR: 76 mL/min/{1.73_m2} — ABNORMAL LOW (ref >90)
Glucose: 177 mg/dl — ABNORMAL HIGH (ref 70–140)
Potassium: 4.3 mEq/L (ref 3.5–5.1)
Sodium: 142 mEq/L (ref 136–145)
Total Bilirubin: 0.56 mg/dL (ref 0.20–1.20)
Total Protein: 6.2 g/dL — ABNORMAL LOW (ref 6.4–8.3)

## 2017-02-20 MED ORDER — PALBOCICLIB 75 MG PO CAPS: 75.0000 mg | ORAL_CAPSULE | Freq: Every day | ORAL | 0 refills | Status: DC

## 2017-02-20 MED FILL — IBRANCE 75 MG CAPSULE: 75 | 21 days supply | Qty: 21 | Fill #0

## 2017-02-20 NOTE — Telephone Encounter (Signed)
Called and left voice message for patient to call to see what Surgeon said of when she could start radiation, if she has healed enough , and free of infection, asked that she call us, my phone [phone], or she can call our scheduler Moshe Salisbury  and set that up when she is ready to start 2:47 PM

## 2017-02-20 NOTE — Telephone Encounter (Signed)
Oral Oncology Patient Advocate Encounter  Met with patient in the lobby this morning.  She brought in a letter from [employer] stating that if no activity occurred on her account by November 3rd, 2018, her grant would be rescinded.  A prescription was filled today for her and I ensured that PA[facility] was billed for the copayment.  This will ensure that her grant will remain in place.    Fabio Asa. [name], [place] Patient [place] [phone] 02/20/2017 3:06 PM

## 2017-02-20 NOTE — Telephone Encounter (Signed)
Gave patient avs and calendar with appts.  °

## 2017-02-20 NOTE — Progress Notes (Signed)
[name]  [phone] [phone]     ID: [name] DOB: 1945-12-29  MR#: [id number]  MHD#:[id number]  Patient Care Team: Biagio Borg, MD as PCP - Stevan Born, MD as Consulting Physician (General Surgery) Magrinat, Virgie Dad, MD as Consulting Physician (Oncology) Kyung Rudd, MD as Consulting Physician (Radiation Oncology) Bobbye Charleston, MD as Consulting Physician (Obstetrics and Gynecology) Nada Libman, MD as Referring Physician (Specialist) Vevelyn Royals, MD as Consulting Physician (Ophthalmology) Lamonte Sakai Rose Fillers, MD as Consulting Physician (Pulmonary Disease) OTHER MD:  CHIEF COMPLAINT: Estrogen receptor positive breast cancer  CURRENT TREATMENT: Letrozole, palbociclib   BREAST CANCER HISTORY: From the original intake note:  [name] had screening mammography showing some suspicious calcifications in the right breast leading to right diagnostic mammography with ultrasonography 01/22/2016 at [facility]. The breast density was category C. In the upper right breast there was a 2.3 cm mass with additional masses measuring 0.9 and 0.7 cm. There was also a possible additional 0.8 mass in the lower inner quadrant. Ultrasound confirmed an irregular hypoechoic mass in the right breast upper outer quadrant measuring 2.0 cm. There were other masses measuring 0.7 and 0.8 cm by ultrasonography. The right axilla was sonographically benign.  Biopsy of a 12:00 and 4:00 mass in the right breast 01/22/2016 showed ([id number]) both specimens showing invasive ductal carcinoma, grade 1 or 2, both 95% estrogen receptor positive, both 95% progesterone receptor positive, both with strong staining intensity, with MIB-1 ranging from 10-15%, and both HER-2 negative, the signals ratio being 1.23-1.42, and the number per cell 1.85-2.59.  Her subsequent history is as detailed below  INTERVAL HISTORY: [name] is here today for evaluation and follow up of  her metastatic breast cancer.  She also wants to discuss the possibility of restarting the Palbociclib since she feels much better than she previously did when she saw Dr. Jana Hakim on 01/31/2017.  After her appointment with Dr. Jana Hakim, she did develop a breast and axillary abscess, the breast was drained on 8/24 and treated with Augmentin that was changed to Levaquin, and axilla that was drained on 8/28.  Her last appointment was with Dr. Harlow Asa and her axilla was draining serous fluid, and was not infected and improving.    REVIEW OF SYSTEMS: [name] does not feel as fatigued as she did previously.  She denies fevers, chills, nausea, vomiting, aches/pains, or any other concerns.  A detailed ROS was conducted and was non contributory.    PAST MEDICAL HISTORY: Past Medical History:  Diagnosis Date  . Breast cancer ([place])   . Cancer ([place]) 02/2016   right breast  . DIABETES MELLITUS, TYPE II 01/04/2007   only takes actoplus daily  . Dizziness and giddiness 02/29/2008  . DVT, HX OF    at age 30 in right buttocks  . Dyspnea    due to lung cancer  . Family history of breast cancer   . GERD 01/04/2007   pt reports resolved   . GLAUCOMA 07/30/2008   both eyes  . History of blood transfusion    no abnormal  reaction  . History of uterine cancer 2000   hysterectomy done  . HYPERLIPIDEMIA 01/04/2007   taking Pravastatin daily  . HYPERTENSION 01/04/2007   takes Lisinopril daily  . Joint pain   . Joint swelling   . Leg cramps   . LEG PAIN, LEFT 07/06/2007  . NUMBNESS 07/30/2008   in fingers;pt states from [facility]  . OSTEOARTHRITIS, HIP 09/25/2009  .  OTITIS MEDIA, ACUTE, BILATERAL 02/29/2008  . Overweight(278.02) 01/04/2007  . Peripheral vascular disease ([place])   . Pneumonia   . PONV (postoperative nausea and vomiting)   . SLEEP APNEA, OBSTRUCTIVE    doesn't use a cpap;study done about 83yr ago  . TRANSIENT ISCHEMIC ATTACK, HX OF 01/04/2007  . Vision loss    left eye    PAST SURGICAL  HISTORY: Past Surgical History:  Procedure Laterality Date  . ABDOMINAL HYSTERECTOMY  2000  . BREAST LUMPECTOMY WITH RADIOACTIVE SEED AND SENTINEL LYMPH NODE BIOPSY Right 01/13/2017   Procedure: RIGHT BREAST RADIOACTIVE SEED X'S 2 GUIDED LUMPECTOMY WITH RADIOACTIVE SEED TARGETED AXILLARYLYMPH NODE EXCISION AND RIGHT AXILLARY SENTINEL LYMPH NODE BIOPSY;  Surgeon: NAlphonsa Overall MD;  [facility]  Service: General;  Laterality: Right;  2 SEEDS IN RIGHT BREAST 1 SEED IN RIGHT AXILLARY NODE  . CHOLECYSTECTOMY    . ENDOBRONCHIAL ULTRASOUND Bilateral 03/28/2016   Procedure: ENDOBRONCHIAL ULTRASOUND;  Surgeon: RCollene Gobble MD;  Location: [facility];  Service: Cardiopulmonary;  Laterality: Bilateral;  . EYE SURGERY  13   shunt left and lazer eye surgery on right cataract and retenia tear with repair  . growth removal  2004   from thumb  . KNEE ARTHROSCOPY Right   . mulitple eye surgeries     both eyes, cataracts with ioc done both eyes  . OOPHORECTOMY    . TOTAL HIP ARTHROPLASTY  06/24/2011   Procedure: TOTAL HIP ARTHROPLASTY;  Surgeon: FKerin Salen  [facility]  Service: Orthopedics;  Laterality: Right;  . TOTAL HIP ARTHROPLASTY Left 11/12/2012   Dr RMayer Camel . TOTAL HIP ARTHROPLASTY Left 11/12/2012   Procedure: TOTAL HIP ARTHROPLASTY;  Surgeon: FKerin Salen MD;  [facility]  Service: Orthopedics;  Laterality: Left;  DEPUY PINNACLE    FAMILY HISTORY Family History  Problem Relation Age of Onset  . Dementia Mother   . Cancer Mother        Breast and lung cancer  . Stroke Sister   . Breast cancer Sister 537 . Heart attack Maternal Aunt   . Lung cancer Maternal Grandmother        non smoker  . Glaucoma Maternal Grandfather   . Anesthesia problems Neg Hx   The patient's father died at age 71 the patient's mother died at age 71 She had breast and lung cancers diagnosed shortly before her death. The patient had no brothers, 2 sisters. One sister was diagnosed with breast  cancer at the age of 565  GYNECOLOGIC HISTORY:  No LMP recorded. Patient has had a hysterectomy. Menarche age 71 first live birth age 71 the patient is GX P1. She had a hysterectomy for endometrial cancer in the year 2000. She did not take hormone replacement. She did use oral contraceptives for your than 20 years remotely, with no complications.  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 She is home with her husband [name] He is a retired mDealerTheir son CJuanda Crumblealso lives in [place]    ADVANCED DIRECTIVES: In place  HEALTH MAINTENANCE: Social History  Substance Use Topics  . Smoking status: Never Smoker  . Smokeless tobacco: Never Used  . Alcohol use No     Colonoscopy: Never  PAP: Status post hysterectomy  Bone density: Remote   Allergies  Allergen Reactions  . Codeine Hives    Hycodan syrup  . Fluorescein Nausea  And Vomiting    ? IV dye for retina specialist  . Lipitor [Atorvastatin Calcium]     Leg cramp  . Oxycodone Nausea And Vomiting    Patient vomited for 3 days after taking  . Sitagliptin Phosphate Nausea And Vomiting  . Sulfa Drugs Cross Reactors Nausea And Vomiting    Current Outpatient Prescriptions  Medication Sig Dispense Refill  . acetaminophen (TYLENOL) 500 MG tablet Take 1,000 mg by mouth every 4 (four) hours as needed for moderate pain or fever.    Marland Kitchen aspirin EC 81 MG tablet Take 81 mg by mouth daily at 6 PM. 1700    . bimatoprost (LUMIGAN) 0.01 % SOLN Place 1 drop into both eyes at bedtime.    . brimonidine-timolol (COMBIGAN) 0.2-0.5 % ophthalmic solution Place 1 drop into both eyes three times daily    . cholecalciferol (VITAMIN D) 1000 units tablet Take 1,000 Units by mouth daily.    . dorzolamide-timolol (COSOPT) 22.3-6.8 MG/ML ophthalmic solution Place 1 drop into both eyes 2 (two) times daily.    Marland Kitchen HYDROcodone-acetaminophen (NORCO/VICODIN) 5-325 MG tablet  Take 1-2 tablets by mouth every 6 (six) hours as needed for moderate pain. 20 tablet 0  . letrozole (FEMARA) 2.5 MG tablet Take 1 tablet (2.5 mg total) by mouth at bedtime. (Patient taking differently: Take 2.5 mg by mouth daily at 6 PM. 1700)    . lisinopril (PRINIVIL,ZESTRIL) 20 MG tablet Take 1 tablet (20 mg total) by mouth daily. (Patient taking differently: Take 20 mg by mouth daily at 6 PM. 1700) 90 tablet 3  . lovastatin (MEVACOR) 20 MG tablet Take 1 tablet (20 mg total) by mouth every evening. (Patient taking differently: Take 20 mg by mouth daily at 6 PM. 1700) 90 tablet 3  . palbociclib (IBRANCE) 100 MG capsule Take 1 capsule (100 mg total) by mouth daily with breakfast. Take whole with food. 21 capsule 6  . pioglitazone-metformin (ACTOPLUS MET) 15-500 MG tablet Take 15-500 tablets by mouth daily. (Patient taking differently: Take 1 tablet by mouth daily at 6 PM. 1700) 90 tablet 3  . prochlorperazine (COMPAZINE) 10 MG tablet Take 1 tablet (10 mg total) by mouth every 6 (six) hours as needed for nausea or vomiting. 30 tablet 0   No current facility-administered medications for this visit.      OBJECTIVE:   Vitals:   02/20/17 0918  BP: (!) 131/51  Pulse: 60  Resp: 18  Temp: 97.9 F (36.6 C)  SpO2: 98%     Body mass index is 40.06 kg/m.    ECOG FS:1 - Symptomatic but completely ambulatory Filed Weights   02/20/17 0918  Weight: 233 lb 6.4 oz (105.9 kg)   GENERAL: Patient is a well appearing older female in no acute distress HEENT:  Sclerae anicteric.  Oropharynx clear and moist. No ulcerations or evidence of oropharyngeal candidiasis. Neck is supple.  NODES:  No cervical, supraclavicular, or left axillary lymphadenopathy palpated. Right axilla with bandage covering it, slightly tender area, but bandage was clean dry and intact, no foul smelling drainage, erythema, swelling noted. BREAST EXAM:  Right breast s/p lumpectomy, healing well, no sign of infection LUNGS:  Clear to  auscultation bilaterally.  No wheezes or rhonchi. HEART:  Regular rate and rhythm. No murmur appreciated. ABDOMEN:  Soft, nontender.  Positive, normoactive bowel sounds. No organomegaly palpated. MSK:  No focal spinal tenderness to palpation. Full range of motion bilaterally in the upper extremities. EXTREMITIES:  No peripheral edema.   SKIN:  Clear with no obvious rashes or skin changes. No nail dyscrasia. NEURO:  Nonfocal. Well oriented.  Appropriate affect.     LAB RESULTS:  CMP     Component Value Date/Time   NA 142 01/03/2017 1241   K 4.1 01/03/2017 1241   CL 107 07/23/2016 0550   CO2 27 01/03/2017 1241   GLUCOSE 128 01/03/2017 1241   BUN 11.8 01/03/2017 1241   CREATININE 0.8 01/03/2017 1241   CALCIUM 9.4 01/03/2017 1241   PROT 6.7 01/03/2017 1241   ALBUMIN 3.6 01/03/2017 1241   AST 10 01/03/2017 1241   ALT 14 01/03/2017 1241   ALKPHOS 62 01/03/2017 1241   BILITOT 0.39 01/03/2017 1241   GFRNONAA >60 07/23/2016 0550   GFRAA >60 07/23/2016 0550    INo results found for: SPEP, UPEP  Lab Results  Component Value Date   WBC 3.8 (L) 02/20/2017   NEUTROABS 2.0 02/20/2017   HGB 11.2 (L) 02/20/2017   HCT 34.5 (L) 02/20/2017   MCV 90.3 02/20/2017   PLT 244 02/20/2017      Chemistry      Component Value Date/Time   NA 142 01/03/2017 1241   K 4.1 01/03/2017 1241   CL 107 07/23/2016 0550   CO2 27 01/03/2017 1241   BUN 11.8 01/03/2017 1241   CREATININE 0.8 01/03/2017 1241      Component Value Date/Time   CALCIUM 9.4 01/03/2017 1241   ALKPHOS 62 01/03/2017 1241   AST 10 01/03/2017 1241   ALT 14 01/03/2017 1241   BILITOT 0.39 01/03/2017 1241       No results found for: LABCA2  No components found for: LABCA125  No results for input(s): INR in the last 168 hours.  Urinalysis    Component Value Date/Time   COLORURINE YELLOW 07/22/2016 1132   APPEARANCEUR HAZY (A) 07/22/2016 1132   LABSPEC 1.013 07/22/2016 1132   PHURINE 5.0 07/22/2016 1132   GLUCOSEU  NEGATIVE 07/22/2016 1132   GLUCOSEU NEGATIVE 10/30/2014 1513   HGBUR MODERATE (A) 07/22/2016 1132   BILIRUBINUR NEGATIVE 07/22/2016 1132   KETONESUR 5 (A) 07/22/2016 1132   PROTEINUR NEGATIVE 07/22/2016 1132   UROBILINOGEN 0.2 10/30/2014 1513   NITRITE NEGATIVE 07/22/2016 1132   LEUKOCYTESUR LARGE (A) 07/22/2016 1132     STUDIES: No results found. ELIGIBLE FOR AVAILABLE RESEARCH PROTOCOL: Multiple, as noted below  ASSESSMENT: 71 y.o. [place] woman with a remote history of early stage endometrial cancer, now status post right breast upper outer quadrant biopsy 01/22/2016 for a clinically multifocal T2 N0, stage 2A invasive ductal carcinoma, grade 1, estrogen and progesterone receptor positive, HER-2 negative, with an MIB-1 between 10 and 15%.  (1) right axillary lymph node biopsy 03/02/2016 positive  (2) genetics testing 01/13/2016 through the Custom gene panel offered by [facility] found no deleterious mutations in  ATM, BARD1, BRCA1, BRCA2, BRIP1, CDH1, CHEK2, EPCAM, FANCC, MLH1, MSH2, MSH6, MUTYH, NBN, PALB2, PMS2, POLD1, PTEN, RAD51C, RAD51D, TP53, and XRCC2  METASTATIC DISEASE: OCT 2017 (3) CT scans of the chest abdomen and pelvis obtained 03/10/2016 are consistent with bilateral lung metastases and mediastinal and hilar nodal involvement, but no liver or bone spread  (a) bronchoscopic lymph node biopsy 2 (station 7, 13R) 03/28/2016 confirms metastatic adenocarcinoma, estrogen receptor positive, HER-2 not amplified  (b) baseline CA-27-29 on 04/18/2016 was 137.5.  (4) letrozole started 03/15/2016, palbociclib added 03/29/2016 at 125 mg/day, 21/7  (a) dose decreased to 100 mg per day, 21/7, beginning with February cycle  (b) palbociclib held 01/31/2017,  with increasing symptoms  (5) status post double right lumpectomies and right axillary lymph node sampling 01/13/2017 for 2 separate invasive ductal carcinoma lesions, pT1a and pT1b, N1a, with negative margins, both lesions being  estrogen and progesterone receptor positive and HER-2 negative  (6) adjuvant radiation to follow   PLAN: [name] is doing well today and she has recovered from her infections.  Her labs have stabilized, and her [place] is normal today.  I reviewed her cbc with her in detail.  She also saw Dr. Jana Hakim today as well, and after discussion, she will restart the Palbociclib on Wednesday at 75 mg per day, 3 weeks on 1 week off.  She will also re-consider radiation and make appointment to see Dr. Lisbeth Renshaw in two weeks to plan it.  She was encouraged to proceed with radiation.  She will return 4 weeks from this Wednesday for labs and follow up.  She knows to call for any questions or concerns whatsoever.     Scot Dock, NP   02/20/2017 9:29 AM Medical Oncology and Hematology [facility] [address] [place], [place] [address] Tel. [phone]    Fax. [phone]   ADDENDUM: [name] is looking terrific. She has gotten over the postoperative complications. She was having some doubts regarding radiation but we reviewed the fact that the fact that she is being offered radiation is extremely positive. Generally we do not do that with stage IV patients unless we expect them to live quite a while, that is her situation.  Once we clarified that she is accepting of the plan and she will proceed to her radiation treatments  We're going to continue to see her on a monthly basis for the time being. She has an appointment with me specifically in November.  I personally saw this patient and performed a substantive portion of this encounter with the listed APP documented above.   Chauncey Cruel, MD Medical Oncology and Hematology [facility] [address] [place], [place] [address] Tel. [phone]    Fax. [phone]

## 2017-02-21 ENCOUNTER — Encounter: Payer: Self-pay | Admitting: Adult Health

## 2017-02-21 LAB — CANCER ANTIGEN 27.29: CA 27.29: 15.4 U/mL (ref 0.0–38.6)

## 2017-03-07 DIAGNOSIS — Z23 Encounter for immunization: Secondary | ICD-10-CM | POA: Diagnosis not present

## 2017-03-14 ENCOUNTER — Ambulatory Visit: Payer: Medicare Other

## 2017-03-14 ENCOUNTER — Ambulatory Visit
Admission: RE | Admit: 2017-03-14 | Discharge: 2017-03-14 | Disposition: A | Discharge status: Home / Self Care | Payer: Medicare Other | Source: Ambulatory Visit | Attending: Radiation Oncology | Admitting: Radiation Oncology

## 2017-03-14 ENCOUNTER — Ambulatory Visit: Payer: Medicare Other | Admitting: Radiation Oncology

## 2017-03-16 ENCOUNTER — Other Ambulatory Visit: Payer: Self-pay | Admitting: Adult Health

## 2017-03-16 DIAGNOSIS — C50411 Malignant neoplasm of upper-outer quadrant of right female breast: Secondary | ICD-10-CM

## 2017-03-16 DIAGNOSIS — Z170 Estrogen receptor positive status [ER+]: Secondary | ICD-10-CM

## 2017-03-16 DIAGNOSIS — C7800 Secondary malignant neoplasm of unspecified lung: Secondary | ICD-10-CM

## 2017-03-17 MED FILL — IBRANCE 75 MG CAPSULE: 75 | 21 days supply | Qty: 21 | Fill #0

## 2017-03-21 ENCOUNTER — Telehealth: Payer: Self-pay | Admitting: Pharmacy Technician

## 2017-03-21 NOTE — Telephone Encounter (Signed)
Oral Oncology Patient Advocate Encounter  Met patient in [place] to complete application for [employer] in an effort to reduce patient's out of pocket expense for [place] to $0.    Application completed and faxed to [phone].   [employer] patient assistance phone number for follow up is [phone].   This encounter will be updated until final determination.   Fabio Asa. [name], [place] Patient [place] [phone] 03/21/2017 3:14 PM

## 2017-03-22 ENCOUNTER — Telehealth: Payer: Self-pay | Admitting: Oncology

## 2017-03-22 ENCOUNTER — Ambulatory Visit (HOSPITAL_BASED_OUTPATIENT_CLINIC_OR_DEPARTMENT_OTHER): Payer: Medicare Other | Admitting: Adult Health

## 2017-03-22 ENCOUNTER — Encounter: Payer: Self-pay | Admitting: Adult Health

## 2017-03-22 ENCOUNTER — Other Ambulatory Visit (HOSPITAL_BASED_OUTPATIENT_CLINIC_OR_DEPARTMENT_OTHER): Payer: Medicare Other

## 2017-03-22 VITALS — BP 174/62 | HR 68 | Temp 98.4°F | Resp 17 | Ht 64.0 in | Wt 231.3 lb

## 2017-03-22 DIAGNOSIS — C50411 Malignant neoplasm of upper-outer quadrant of right female breast: Secondary | ICD-10-CM | POA: Diagnosis not present

## 2017-03-22 DIAGNOSIS — Z170 Estrogen receptor positive status [ER+]: Secondary | ICD-10-CM

## 2017-03-22 DIAGNOSIS — Z8542 Personal history of malignant neoplasm of other parts of uterus: Secondary | ICD-10-CM | POA: Diagnosis not present

## 2017-03-22 DIAGNOSIS — Z79811 Long term (current) use of aromatase inhibitors: Secondary | ICD-10-CM

## 2017-03-22 DIAGNOSIS — C7802 Secondary malignant neoplasm of left lung: Secondary | ICD-10-CM

## 2017-03-22 DIAGNOSIS — K760 Fatty (change of) liver, not elsewhere classified: Secondary | ICD-10-CM

## 2017-03-22 DIAGNOSIS — C7801 Secondary malignant neoplasm of right lung: Secondary | ICD-10-CM | POA: Diagnosis not present

## 2017-03-22 LAB — COMPREHENSIVE METABOLIC PANEL
ALT: 16 U/L (ref 0–55)
AST: 15 U/L (ref 5–34)
Albumin: 3.7 g/dL (ref 3.5–5.0)
Alkaline Phosphatase: 56 U/L (ref 40–150)
Anion Gap: 7 mEq/L (ref 3–11)
BUN: 9.1 mg/dL (ref 7.0–26.0)
CO2: 25 mEq/L (ref 22–29)
Calcium: 9 mg/dL (ref 8.4–10.4)
Chloride: 108 mEq/L (ref 98–109)
Creatinine: 0.8 mg/dL (ref 0.6–1.1)
EGFR: >60 mL/min/{1.73_m2} (ref >60)
Glucose: 166 mg/dl — ABNORMAL HIGH (ref 70–140)
Potassium: 4.3 mEq/L (ref 3.5–5.1)
Sodium: 141 mEq/L (ref 136–145)
Total Bilirubin: 0.5 mg/dL (ref 0.20–1.20)
Total Protein: 6.5 g/dL (ref 6.4–8.3)

## 2017-03-22 LAB — CBC WITH DIFFERENTIAL/PLATELET
BASO%: 0.8 % (ref 0.0–2.0)
Basophils Absolute: 0 10*3/uL (ref 0.0–0.1)
EOS%: 0.8 % (ref 0.0–7.0)
Eosinophils Absolute: 0 10*3/uL (ref 0.0–0.5)
HCT: 33.6 % — ABNORMAL LOW (ref 34.8–46.6)
HGB: 11.3 g/dL — ABNORMAL LOW (ref 11.6–15.9)
LYMPH%: 49.4 % (ref 14.0–49.7)
MCH: 29.8 pg (ref 25.1–34.0)
MCHC: 33.6 g/dL (ref 31.5–36.0)
MCV: 88.7 fL (ref 79.5–101.0)
MONO#: 0.3 10*3/uL (ref 0.1–0.9)
MONO%: 12.6 % (ref 0.0–14.0)
NEUT#: 1 10*3/uL — ABNORMAL LOW (ref 1.5–6.5)
NEUT%: 36.4 % — ABNORMAL LOW (ref 38.4–76.8)
Platelets: 185 10*3/uL (ref 145–400)
RBC: 3.79 10*6/uL (ref 3.70–5.45)
RDW: 17.4 % — ABNORMAL HIGH (ref 11.2–14.5)
WBC: 2.6 10*3/uL — ABNORMAL LOW (ref 3.9–10.3)
lymph#: 1.3 10*3/uL (ref 0.9–3.3)

## 2017-03-22 NOTE — Progress Notes (Signed)
[name]  [phone] Fax:([phone]) [phone]     ID: [name] DOB: [birth year]  MR#: [id number]  ZDG#:[id number]  Patient Care Team: Biagio Borg, MD as PCP - Stevan Born, MD as Consulting Physician (General Surgery) Magrinat, Virgie Dad, MD as Consulting Physician (Oncology) Kyung Rudd, MD as Consulting Physician (Radiation Oncology) Bobbye Charleston, MD as Consulting Physician (Obstetrics and Gynecology) Nada Libman, MD as Referring Physician (Specialist) Vevelyn Royals, MD as Consulting Physician (Ophthalmology) Lamonte Sakai Rose Fillers, MD as Consulting Physician (Pulmonary Disease) OTHER MD:  CHIEF COMPLAINT: Estrogen receptor positive breast cancer  CURRENT TREATMENT: Letrozole, palbociclib   BREAST CANCER HISTORY: From the original intake note:  Erline had screening mammography showing some suspicious calcifications in the right breast leading to right diagnostic mammography with ultrasonography 01/22/2016 at [facility]. The breast density was category C. In the upper right breast there was a 2.3 cm mass with additional masses measuring 0.9 and 0.7 cm. There was also a possible additional 0.8 mass in the lower inner quadrant. Ultrasound confirmed an irregular hypoechoic mass in the right breast upper outer quadrant measuring 2.0 cm. There were other masses measuring 0.7 and 0.8 cm by ultrasonography. The right axilla was sonographically benign.  Biopsy of a 12:00 and 4:00 mass in the right breast 01/22/2016 showed ([id number]) both specimens showing invasive ductal carcinoma, grade 1 or 2, both 95% estrogen receptor positive, both 95% progesterone receptor positive, both with strong staining intensity, with MIB-1 ranging from 10-15%, and both HER-2 negative, the signals ratio being 1.23-1.42, and the number per cell 1.85-2.59.  Her subsequent history is as detailed below  INTERVAL HISTORY: [name] is here today for evaluation and follow up of  her metastatic breast cancer.  She is taking palbociclib along with Letrozole.  She is doing well today and is just finishing up from her "off week" of the Palbociclib.    REVIEW OF SYSTEMS: Sima denies any new pain, mouth sores, fevers, chills or any other concerns.  She does note that when she takes the Palbociclib she does have some manageable fatigue.  Otherwise a detailed ROS is non contributory.      PAST MEDICAL HISTORY: Past Medical History:  Diagnosis Date  . Breast cancer ([place])   . Cancer ([place]) 02/2016   right breast  . DIABETES MELLITUS, TYPE II 01/04/2007   only takes actoplus daily  . Dizziness and giddiness 02/29/2008  . DVT, HX OF    at age 4 in right buttocks  . Dyspnea    due to lung cancer  . Family history of breast cancer   . GERD 01/04/2007   pt reports resolved   . GLAUCOMA 07/30/2008   both eyes  . History of blood transfusion    no abnormal  reaction  . History of uterine cancer 2000   hysterectomy done  . HYPERLIPIDEMIA 01/04/2007   taking Pravastatin daily  . HYPERTENSION 01/04/2007   takes Lisinopril daily  . Joint pain   . Joint swelling   . Leg cramps   . LEG PAIN, LEFT 07/06/2007  . NUMBNESS 07/30/2008   in fingers;pt states from Diamox  . OSTEOARTHRITIS, HIP 09/25/2009  . OTITIS MEDIA, ACUTE, BILATERAL 02/29/2008  . Overweight(278.02) 01/04/2007  . Peripheral vascular disease ([place])   . Pneumonia   . PONV (postoperative nausea and vomiting)   . SLEEP APNEA, OBSTRUCTIVE    doesn't use a cpap;study done about 51yr ago  . TRANSIENT ISCHEMIC ATTACK, HX OF 01/04/2007  .  Vision loss    left eye    PAST SURGICAL HISTORY: Past Surgical History:  Procedure Laterality Date  . ABDOMINAL HYSTERECTOMY  2000  . BREAST LUMPECTOMY WITH RADIOACTIVE SEED AND SENTINEL LYMPH NODE BIOPSY Right 01/13/2017   Procedure: RIGHT BREAST RADIOACTIVE SEED X'S 2 GUIDED LUMPECTOMY WITH RADIOACTIVE SEED TARGETED AXILLARYLYMPH NODE EXCISION AND RIGHT AXILLARY SENTINEL LYMPH  NODE BIOPSY;  Surgeon: Alphonsa Overall, MD;  Location: [facility];  Service: General;  Laterality: Right;  2 SEEDS IN RIGHT BREAST 1 SEED IN RIGHT AXILLARY NODE  . CHOLECYSTECTOMY    . ENDOBRONCHIAL ULTRASOUND Bilateral 03/28/2016   Procedure: ENDOBRONCHIAL ULTRASOUND;  Surgeon: Collene Gobble, MD;  Location: [facility];  Service: Cardiopulmonary;  Laterality: Bilateral;  . EYE SURGERY  13   shunt left and lazer eye surgery on right cataract and retenia tear with repair  . growth removal  2004   from thumb  . KNEE ARTHROSCOPY Right   . mulitple eye surgeries     both eyes, cataracts with ioc done both eyes  . OOPHORECTOMY    . TOTAL HIP ARTHROPLASTY  06/24/2011   Procedure: TOTAL HIP ARTHROPLASTY;  Surgeon: Kerin Salen;  Location: [facility];  Service: Orthopedics;  Laterality: Right;  . TOTAL HIP ARTHROPLASTY Left 11/12/2012   Dr Mayer Camel  . TOTAL HIP ARTHROPLASTY Left 11/12/2012   Procedure: TOTAL HIP ARTHROPLASTY;  Surgeon: Kerin Salen, MD;  Location: [facility];  Service: Orthopedics;  Laterality: Left;  DEPUY PINNACLE    FAMILY HISTORY Family History  Problem Relation Age of Onset  . Dementia Mother   . Cancer Mother        Breast and lung cancer  . Stroke Sister   . Breast cancer Sister 53  . Heart attack Maternal Aunt   . Lung cancer Maternal Grandmother        non smoker  . Glaucoma Maternal Grandfather   . Anesthesia problems Neg Hx   The patient's father died at age 32, the patient's mother died at age 70. She had breast and lung cancers diagnosed shortly before her death. The patient had no brothers, 2 sisters. One sister was diagnosed with breast cancer at the age of 23.  GYNECOLOGIC HISTORY:  No LMP recorded. Patient has had a hysterectomy. Menarche age 19, first live birth age 77, the patient is GX P1. She had a hysterectomy for endometrial cancer in the year 2000. She did not take hormone replacement. She did use oral contraceptives for your than 20 years remotely, with no  complications.  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 She is home with her husband Marcello Obrien. He is a retired Dealer.Their son [name] also lives in [place].   ADVANCED DIRECTIVES: In place  HEALTH MAINTENANCE: Social History  Substance Use Topics  . Smoking status: Never Smoker  . Smokeless tobacco: Never Used  . Alcohol use No     Colonoscopy: Never  PAP: Status post hysterectomy  Bone density: Remote   Allergies  Allergen Reactions  . Codeine Hives    Hycodan syrup  . Fluorescein Nausea And Vomiting    ? IV dye for retina specialist  . Lipitor [Atorvastatin Calcium]     Leg cramp  . Oxycodone Nausea And Vomiting    Patient vomited for 3 days after taking  . Sitagliptin Phosphate Nausea And Vomiting  . Sulfa Drugs Cross Reactors Nausea And Vomiting  Current Outpatient Prescriptions  Medication Sig Dispense Refill  . acetaminophen (TYLENOL) 500 MG tablet Take 1,000 mg by mouth every 4 (four) hours as needed for moderate pain or fever.    Marland Kitchen aspirin EC 81 MG tablet Take 81 mg by mouth daily at 6 PM. 1700    . bimatoprost (LUMIGAN) 0.01 % SOLN Place 1 drop into both eyes at bedtime.    . brimonidine-timolol (COMBIGAN) 0.2-0.5 % ophthalmic solution Place 1 drop into both eyes three times daily    . cholecalciferol (VITAMIN D) 1000 units tablet Take 1,000 Units by mouth daily.    . dorzolamide-timolol (COSOPT) 22.3-6.8 MG/ML ophthalmic solution Place 1 drop into both eyes 2 (two) times daily.    Marland Kitchen HYDROcodone-acetaminophen (NORCO/VICODIN) 5-325 MG tablet Take 1-2 tablets by mouth every 6 (six) hours as needed for moderate pain. 20 tablet 0  . IBRANCE 75 MG capsule TAKE 1 CAPSULE (75 MG TOTAL) BY MOUTH DAILY WITH BREAKFAST. TAKE WHOLE WITH FOOD. 21 capsule 0  . letrozole (FEMARA) 2.5 MG tablet Take 1 tablet (2.5 mg total) by mouth at bedtime. (Patient taking differently: Take 2.5 mg by  mouth daily at 6 PM. 1700)    . lisinopril (PRINIVIL,ZESTRIL) 20 MG tablet Take 1 tablet (20 mg total) by mouth daily. (Patient taking differently: Take 20 mg by mouth daily at 6 PM. 1700) 90 tablet 3  . lovastatin (MEVACOR) 20 MG tablet Take 1 tablet (20 mg total) by mouth every evening. (Patient taking differently: Take 20 mg by mouth daily at 6 PM. 1700) 90 tablet 3  . pioglitazone-metformin (ACTOPLUS MET) 15-500 MG tablet Take 15-500 tablets by mouth daily. (Patient taking differently: Take 1 tablet by mouth daily at 6 PM. 1700) 90 tablet 3  . prochlorperazine (COMPAZINE) 10 MG tablet Take 1 tablet (10 mg total) by mouth every 6 (six) hours as needed for nausea or vomiting. 30 tablet 0   No current facility-administered medications for this visit.      OBJECTIVE:   Vitals:   03/22/17 0940  BP: (!) 174/62  Pulse: 68  Resp: 17  Temp: 98.4 F (36.9 C)  SpO2: 98%     Body mass index is 39.7 kg/m.    ECOG FS:1 - Symptomatic but completely ambulatory Filed Weights   03/22/17 0940  Weight: 231 lb 4.8 oz (104.9 kg)   GENERAL: Patient is a well appearing female in no acute distress HEENT:  Sclerae anicteric.  PERRL.  Oropharynx clear and moist. No ulcerations or evidence of oropharyngeal candidiasis. Neck is supple.  NODES:  No cervical, supraclavicular, or axillary lymphadenopathy palpated.  BREAST EXAM:  Deferred. LUNGS:  Clear to auscultation bilaterally.  No wheezes or rhonchi. HEART:  Regular rate and rhythm. No murmur appreciated. ABDOMEN:  Soft, nontender.  Positive, normoactive bowel sounds. No organomegaly palpated. MSK:  No focal spinal tenderness to palpation. Full range of motion bilaterally in the upper extremities. EXTREMITIES:  No peripheral edema.   SKIN:  Clear with no obvious rashes or skin changes. No nail dyscrasia. NEURO:  Nonfocal. Well oriented.  Appropriate affect.    LAB RESULTS:  CMP     Component Value Date/Time   NA 142 02/20/2017 0859   K 4.3  02/20/2017 0859   CL 107 07/23/2016 0550   CO2 23 02/20/2017 0859   GLUCOSE 177 (H) 02/20/2017 0859   BUN 8.9 02/20/2017 0859   CREATININE 0.8 02/20/2017 0859   CALCIUM 9.1 02/20/2017 0859   PROT 6.2 (L) 02/20/2017 0960  ALBUMIN 3.2 (L) 02/20/2017 0859   AST 14 02/20/2017 0859   ALT 18 02/20/2017 0859   ALKPHOS 55 02/20/2017 0859   BILITOT 0.56 02/20/2017 0859   GFRNONAA >60 07/23/2016 0550   GFRAA >60 07/23/2016 0550    INo results found for: SPEP, UPEP  Lab Results  Component Value Date   WBC 2.6 (L) 03/22/2017   NEUTROABS 1.0 (L) 03/22/2017   HGB 11.3 (L) 03/22/2017   HCT 33.6 (L) 03/22/2017   MCV 88.7 03/22/2017   PLT 185 03/22/2017      Chemistry      Component Value Date/Time   NA 142 02/20/2017 0859   K 4.3 02/20/2017 0859   CL 107 07/23/2016 0550   CO2 23 02/20/2017 0859   BUN 8.9 02/20/2017 0859   CREATININE 0.8 02/20/2017 0859      Component Value Date/Time   CALCIUM 9.1 02/20/2017 0859   ALKPHOS 55 02/20/2017 0859   AST 14 02/20/2017 0859   ALT 18 02/20/2017 0859   BILITOT 0.56 02/20/2017 0859       No results found for: LABCA2  No components found for: LABCA125  No results for input(s): INR in the last 168 hours.  Urinalysis    Component Value Date/Time   COLORURINE YELLOW 07/22/2016 1132   APPEARANCEUR HAZY (A) 07/22/2016 1132   LABSPEC 1.013 07/22/2016 1132   PHURINE 5.0 07/22/2016 1132   GLUCOSEU NEGATIVE 07/22/2016 1132   GLUCOSEU NEGATIVE 10/30/2014 1513   HGBUR MODERATE (A) 07/22/2016 1132   BILIRUBINUR NEGATIVE 07/22/2016 1132   KETONESUR 5 (A) 07/22/2016 1132   PROTEINUR NEGATIVE 07/22/2016 1132   UROBILINOGEN 0.2 10/30/2014 1513   NITRITE NEGATIVE 07/22/2016 1132   LEUKOCYTESUR LARGE (A) 07/22/2016 1132     STUDIES: No results found. ELIGIBLE FOR AVAILABLE RESEARCH PROTOCOL: Multiple, as noted below  ASSESSMENT: 71 y.o. Shannon Garden woman with a remote history of early stage endometrial cancer, now status post  right breast upper outer quadrant biopsy 01/22/2016 for a clinically multifocal T2 N0, stage 2A invasive ductal carcinoma, grade 1, estrogen and progesterone receptor positive, HER-2 negative, with an MIB-1 between 10 and 15%.  (1) right axillary lymph node biopsy 03/02/2016 positive  (2) genetics testing 01/13/2016 through the Custom gene panel offered by [facility] found no deleterious mutations in  ATM, BARD1, BRCA1, BRCA2, BRIP1, CDH1, CHEK2, EPCAM, FANCC, MLH1, MSH2, MSH6, MUTYH, NBN, PALB2, PMS2, POLD1, PTEN, RAD51C, RAD51D, TP53, and XRCC2  METASTATIC DISEASE: OCT 2017 (3) CT scans of the chest abdomen and pelvis obtained 03/10/2016 are consistent with bilateral lung metastases and mediastinal and hilar nodal involvement, but no liver or bone spread  (a) bronchoscopic lymph node biopsy 2 (station 7, 13R) 03/28/2016 confirms metastatic adenocarcinoma, estrogen receptor positive, HER-2 not amplified  (b) baseline CA-27-29 on 04/18/2016 was 137.5.  (4) letrozole started 03/15/2016, palbociclib added 03/29/2016 at 125 mg/day, 21/7  (a) dose decreased to 100 mg per day, 21/7, beginning with February cycle  (b) palbociclib held 01/31/2017, with increasing symptoms  (c) Palbociclib decreased to '75mg'$  , 21/7 beginning with 02/22/2017 cycle  (5) status post double right lumpectomies and right axillary lymph node sampling 01/13/2017 for 2 separate invasive ductal carcinoma lesions, pT1a and pT1b, N1a, with negative margins, both lesions being estrogen and progesterone receptor positive and HER-2 negative  (6) adjuvant radiation to follow   PLAN: Kimbella is doing well today.  She tolerated the last cycle of Palbociclib well.  She continues on Letrozole daily.  Her ANC is 1.  I reviewed this with Dr. Jana Hakim.  She will hold the Palbociclib for one week and then restart.  She will not have labs drawn prior to restarting.  She will then see Dr. Jana Hakim in 5 weeks instead of 4 weeks.  She has an appointment  to see radiation oncology in about one month. She verbalizes understanding of this plan and is in agreement with it.    A total of (20) minutes of face-to-face time was spent with this patient with greater than 50% of that time in counseling and care-coordination.   Scot Dock, NP   03/22/2017 9:42 AM Medical Oncology and Hematology [facility] [address] [place], [place] [address] Tel. [phone]    Fax. [phone]

## 2017-03-22 NOTE — Telephone Encounter (Signed)
Scheduled appts per 10/10. Patient did not want avs or calendar.

## 2017-03-23 LAB — CANCER ANTIGEN 27.29: CA 27.29: 18.7 U/mL (ref 0.0–38.6)

## 2017-03-23 NOTE — Progress Notes (Addendum)
Location of Breast Cancer:Upper-outer quadrant of right female breast  FUN after right lumpectomy 01-13-17 to move forward with the simulation and planning process.  Histology per Pathology Report:   Diagnosis 01-13-17 1 Receptor Status: ER(100% +), PR (10% +), Her2-neu (neg), Ki-() 2 2C  Receptor Status: ER(90% +), PR (50% +), Her2-neu (neg), Ki-()   2 22F Receptor Status: ER(95% +), PR (35% +), Her2-neu (neg), Ki-()  1. Breast, lumpectomy, Right w/seed @ 4 O'clock #1 - INVASIVE DUCTAL CARCINOMA, GRADE 1, SPANNING 0.9 CM. - RESECTION MARGINS ARE NEGATIVE. - TREATMENT EFFECT. - SEE ONCOLOGY TABLE FOR PART #2. 2. Breast, lumpectomy, Right w/seed @ 12 O'clock #2 - MULTIFOCAL INVASIVE DUCTAL CARCINOMA, GRADE 1, SPANNING 1.5 CM AND 0.5 CM. - INVASIVE CARCINOMA COMES TO WITHIN <0.1 CM OF THE SUPERIOR MARGIN MULTIFOCALLY. - TREATMENT EFFECT. - SEE ONCOLOGY TABLE. 3. Lymph node, sentinel, biopsy, Right Axillary Diagnosis(continued) - METASTATIC CARCINOMA IN ONE OF ONE LYMPH NODES (1/1). 4. Lymph node, sentinel, biopsy, Right - ONE OF ONE LYMPH NODE NEGATIVE FOR CARCINOMA (0/1). 5. Lymph node, sentinel, biopsy, Right - ONE OF ONE LYMPH NODE NEGATIVE FOR CARCINOMA (0/1). 6. Lymph node, sentinel, biopsy, Right - METASTATIC CARCINOMA IN ONE OF ONE LYMPH NODES (1/1). 7. Lymph node, sentinel, biopsy, Right - METASTATIC CARCINOMA IN ONE OF ONE LYMPH NODES (1/1). 8. Breast, excision, Right New Inferior Margin - FIBROCYSTIC CHANGE. - NO MALIGNANCY IDENTIFIED  Receptor Status: ER(95% +), PR (95% +), Her2-neu (neg), Ki-(10%)  Diagnosis 03-02-16 Lymph node, needle/core biopsy, right axillary - LYMPH NODE TISSUE WITH METASTATIC CARCINOMA  Receptor Status: ER(95% +), PR (95% +), Her2-neu (neg), Ki-(10%)  Diagnosis 01-22-16 1. Breast, right, needle core biopsy, 12:00 o'clock - INVASIVE DUCTAL CARCINOMA, SEE COMMENT. 2. Breast, right, needle core biopsy, 4:00 o'clock - INVASIVE DUCTAL  CARCINOMA, SEE COMMENT   Did patient present with symptoms (if so, please note symptoms) or was this found on screening mammography?: screening mammogram   Past/Anticipated interventions by surgeon, if PPI:RJJOA Newman,M.D Breast, lumpectomy, Right w/seed08-03-18, referral montoring  Past/Anticipated interventions by medical oncology, if any: Chemotherapy palbociclib at 100 mg daily 21 days on 14 days off, Ibrance and Letrozole daily,03-02-17 Lymph node Right axilla, needle/core biopsy, right axillary,01-22-16, 08-02-17genetics testing   Lymphedema issues, if any:No Skin to right breast healing without problems per Dr. Alphonsa Overall at her follow up visit 04-07-17 She has good ROM to both amrs.  Pain issues, if any:No  SAFETY ISSUES:  Prior radiation?:No  Pacemaker/ICD?:No  Possiblecurrent pregnancy?:No  Is the patient on methotrexate?: No Menarche age 89, first live birth age 68, the patient is G62P1. She had a hysterectomy for endometrial cancer in the year 2000. She did not take hormone replacement. She did use oral contraceptives for morethan 20 years remotely, with no complications. Mother had breast cancer and lung cancer,sister breast cancer  Current Complaints / other details: 71 y.o. married retired woman lives with her husband.           Wt Readings from Last 3 Encounters:  04/25/17 233 lb 11.2 oz (106 kg)  04/25/17 233 lb 11.2 oz (106 kg)  03/22/17 231 lb 4.8 oz (104.9 kg)   BP (!) 168/70 (BP Location: Left Arm, Patient Position: Sitting, Cuff Size: Large)   Pulse 64   Temp 97.6 F (36.4 C) (Oral)   Resp 20   Ht _0  (1.626 m)   Wt 233 lb 11.2 oz (106 kg)   SpO2 100%   BMI 40.11 kg/m

## 2017-03-30 ENCOUNTER — Ambulatory Visit: Payer: Medicare Other

## 2017-03-30 ENCOUNTER — Ambulatory Visit: Payer: Medicare Other | Admitting: Radiation Oncology

## 2017-03-31 NOTE — Telephone Encounter (Signed)
No entry 

## 2017-04-07 NOTE — Telephone Encounter (Signed)
Oral Oncology Patient Advocate Encounter  Received notification from [place] Patient Assistance program that patient has been successfully enrolled into their program to receive Ibrance from the manufacturer at $0 out of pocket until 06/12/2017.   I called and left a message for Ms Delker with the good news.  Additionally, [employer] has been having difficulty reaching the patient to schedule shipment of the [place].  The number to call is [phone].  Oral Oncology Clinic will continue to follow.  Gilmore Laroche, CPhT, [facility] Patient Advocate [phone] 04/07/2017 2:26 PM

## 2017-04-18 ENCOUNTER — Ambulatory Visit: Payer: Medicare Other | Admitting: Oncology

## 2017-04-18 ENCOUNTER — Other Ambulatory Visit: Payer: Medicare Other

## 2017-04-19 NOTE — Progress Notes (Signed)
[facility]  [phone] [phone]     ID: [name] DOB: 05/09/1946  MR#: [id number]  ZSW#:[id number]  Patient Care Team: Biagio Borg, MD as PCP - Stevan Born, MD as Consulting Physician (General Surgery) Magrinat, Virgie Dad, MD as Consulting Physician (Oncology) Kyung Rudd, MD as Consulting Physician (Radiation Oncology) Bobbye Charleston, MD as Consulting Physician (Obstetrics and Gynecology) Nada Libman, MD as Referring Physician (Specialist) Vevelyn Royals, MD as Consulting Physician (Ophthalmology) Lamonte Sakai Rose Fillers, MD as Consulting Physician (Pulmonary Disease) OTHER MD:  CHIEF COMPLAINT: Estrogen receptor positive breast cancer  CURRENT TREATMENT: Letrozole, palbociclib   BREAST CANCER HISTORY: From the original intake note:  [name] had screening mammography showing some suspicious calcifications in the right breast leading to right diagnostic mammography with ultrasonography 01/22/2016 at [facility]. The breast density was category C. In the upper right breast there was a 2.3 cm mass with additional masses measuring 0.9 and 0.7 cm. There was also a possible additional 0.8 mass in the lower inner quadrant. Ultrasound confirmed an irregular hypoechoic mass in the right breast upper outer quadrant measuring 2.0 cm. There were other masses measuring 0.7 and 0.8 cm by ultrasonography. The right axilla was sonographically benign.  Biopsy of a 12:00 and 4:00 mass in the right breast 01/22/2016 showed ([id number]) both specimens showing invasive ductal carcinoma, grade 1 or 2, both 95% estrogen receptor positive, both 95% progesterone receptor positive, both with strong staining intensity, with MIB-1 ranging from 10-15%, and both HER-2 negative, the signals ratio being 1.23-1.42, and the number per cell 1.85-2.59.  Her subsequent history is as detailed below  INTERVAL HISTORY: [name] returns today for follow-up and treatment of  her estrogen receptor positive stage IV breast cancer.  She continues on letrozole, with good tolerance.  She also receives palbociclib, at 75 mg daily, 21 days on, 7 days off.  However we have not been able to resume her palbociclib because of low counts  Her adjuvant radiation had to be postponed because of persistent breast infections.  These finally have resolved and she will have her simulation today  REVIEW OF SYSTEMS: [name] reports being more tired than usual. She notes recently having 4 infections in the right breast that are still healing. She has finished taking antibiotics for the infection. She starts day 1 cycle 1. She is currently not exercising due to recovery. She denies unusual headaches, visual changes, nausea, vomiting, or dizziness. There has been no unusual cough, phlegm production, or pleurisy. This been no change in bowel or bladder habits. She denies unexplained fatigue or unexplained weight loss, bleeding, rash, or fever. A detailed review of systems was otherwise stable.    PAST MEDICAL HISTORY: Past Medical History:  Diagnosis Date  . Breast cancer ([place])   . Cancer ([facility]) 02/2016   right breast  . DIABETES MELLITUS, TYPE II 01/04/2007   only takes actoplus daily  . Dizziness and giddiness 02/29/2008  . DVT, HX OF    at age 70 in right buttocks  . Dyspnea    due to lung cancer  . Family history of breast cancer   . GERD 01/04/2007   pt reports resolved   . GLAUCOMA 07/30/2008   both eyes  . History of blood transfusion    no abnormal  reaction  . History of uterine cancer 2000   hysterectomy done  . HYPERLIPIDEMIA 01/04/2007   taking Pravastatin daily  . HYPERTENSION 01/04/2007   takes Lisinopril daily  .  Joint pain   . Joint swelling   . Leg cramps   . LEG PAIN, LEFT 07/06/2007  . NUMBNESS 07/30/2008   in fingers;pt states from [facility]  . OSTEOARTHRITIS, HIP 09/25/2009  . OTITIS MEDIA, ACUTE, BILATERAL 02/29/2008  . Overweight(278.02) 01/04/2007  .  Peripheral vascular disease ([place])   . Pneumonia   . PONV (postoperative nausea and vomiting)   . SLEEP APNEA, OBSTRUCTIVE    doesn't use a cpap;study done about 68yr ago  . TRANSIENT ISCHEMIC ATTACK, HX OF 01/04/2007  . Vision loss    left eye    PAST SURGICAL HISTORY: Past Surgical History:  Procedure Laterality Date  . ABDOMINAL HYSTERECTOMY  2000  . CHOLECYSTECTOMY    . EYE SURGERY  13   shunt left and lazer eye surgery on right cataract and retenia tear with repair  . growth removal  2004   from thumb  . KNEE ARTHROSCOPY Right   . mulitple eye surgeries     both eyes, cataracts with ioc done both eyes  . OOPHORECTOMY    . TOTAL HIP ARTHROPLASTY Left 11/12/2012   Dr RMayer Camel   FAMILY HISTORY Family History  Problem Relation Age of Onset  . Dementia Mother   . Cancer Mother        Breast and lung cancer  . Stroke Sister   . Breast cancer Sister 56 . Heart attack Maternal Aunt   . Lung cancer Maternal Grandmother        non smoker  . Glaucoma Maternal Grandfather   . Anesthesia problems Neg Hx   The patient's father died at age 71 the patient's mother died at age 71 She had breast and lung cancers diagnosed shortly before her death. The patient had no brothers, 2 sisters. One sister was diagnosed with breast cancer at the age of 579  GYNECOLOGIC HISTORY:  No LMP recorded. Patient has had a hysterectomy. Menarche age 71 first live birth age 71 the patient is GX P1. She had a hysterectomy for endometrial cancer in the year 2000. She did not take hormone replacement. She did use oral contraceptives for your than 20 years remotely, with no complications.  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 She is home with her husband [name] He is a retired mDealerTheir son CJuanda [place]also [place]    ADVANCED DIRECTIVES: In place  HEALTH MAINTENANCE: Social History   Tobacco  Use  . Smoking status: Never Smoker  . Smokeless tobacco: Never Used  Substance Use Topics  . Alcohol use: No  . Drug use: No     Colonoscopy: Never  PAP: Status post hysterectomy  Bone density: Remote   Allergies  Allergen Reactions  . Codeine Hives    Hycodan syrup  . Fluorescein Nausea And Vomiting    ? IV dye for retina specialist  . Lipitor [Atorvastatin Calcium]     Leg cramp  . Oxycodone Nausea And Vomiting    Patient vomited for 3 days after taking  . Sitagliptin Phosphate Nausea And Vomiting  . Sulfa Drugs Cross Reactors Nausea And Vomiting    Current Outpatient Medications  Medication Sig Dispense Refill  . acetaminophen (TYLENOL) 500 MG tablet Take 1,000 mg by mouth every 4 (four) hours as needed for moderate pain or fever.    .Marland Kitchenaspirin EC 81 MG tablet Take 81 mg by mouth daily at 6 PM. 1700    .  bimatoprost (LUMIGAN) 0.01 % SOLN Place 1 drop into both eyes at bedtime.    . brimonidine-timolol (COMBIGAN) 0.2-0.5 % ophthalmic solution Place 1 drop into both eyes three times daily    . cholecalciferol (VITAMIN D) 1000 units tablet Take 1,000 Units by mouth daily.    . dorzolamide-timolol (COSOPT) 22.3-6.8 MG/ML ophthalmic solution Place 1 drop into both eyes 2 (two) times daily.    [name] 75 MG capsule TAKE 1 CAPSULE (75 MG TOTAL) BY MOUTH DAILY WITH BREAKFAST. TAKE WHOLE WITH FOOD. 21 capsule 0  . letrozole (FEMARA) 2.5 MG tablet Take 1 tablet (2.5 mg total) by mouth at bedtime. (Patient taking differently: Take 2.5 mg by mouth daily at 6 PM. 1700)    . lisinopril (PRINIVIL,ZESTRIL) 20 MG tablet Take 1 tablet (20 mg total) by mouth daily. (Patient taking differently: Take 20 mg by mouth daily at 6 PM. 1700) 90 tablet 3  . lovastatin (MEVACOR) 20 MG tablet Take 1 tablet (20 mg total) by mouth every evening. (Patient taking differently: Take 20 mg by mouth daily at 6 PM. 1700) 90 tablet 3  . pioglitazone-metformin (ACTOPLUS MET) 15-500 MG tablet Take 15-500 tablets by  mouth daily. (Patient taking differently: Take 1 tablet by mouth daily at 6 PM. 1700) 90 tablet 3  . prochlorperazine (COMPAZINE) 10 MG tablet Take 1 tablet (10 mg total) by mouth every 6 (six) hours as needed for nausea or vomiting. 30 tablet 0   No current facility-administered medications for this visit.      OBJECTIVE: Middle-aged white woman in no acute distress  Vitals:   04/25/17 0938  BP: (!) 168/70  Pulse: 64  Resp: (!) 24  Temp: 97.6 F (36.4 C)  SpO2: 100%     Body mass index is 40.11 kg/m.    ECOG FS:1 - Symptomatic but completely ambulatory Filed Weights   04/25/17 0938  Weight: 233 lb 11.2 oz (106 kg)    Sclerae unicteric, EOMs intact Oropharynx clear and moist No cervical or supraclavicular adenopathy Lungs no rales or rhonchi Heart regular rate and rhythm Abd soft, nontender, positive bowel sounds MSK no focal spinal tenderness, no upper extremity lymphedema Neuro: nonfocal, well oriented, appropriate affect Breasts: The right breast is status post lumpectomy.  The incision in the anterior aspect of the breast as well as the axillary incision have healed over.  There is no erythema, fluctuance, or significant tenderness.  The left breast is unremarkable.  Both axillae are benign    LAB RESULTS:  CMP     Component Value Date/Time   NA 141 03/22/2017 0925   K 4.3 03/22/2017 0925   CL 107 07/23/2016 0550   CO2 25 03/22/2017 0925   GLUCOSE 166 (H) 03/22/2017 0925   BUN 9.1 03/22/2017 0925   CREATININE 0.8 03/22/2017 0925   CALCIUM 9.0 03/22/2017 0925   PROT 6.5 03/22/2017 0925   ALBUMIN 3.7 03/22/2017 0925   AST 15 03/22/2017 0925   ALT 16 03/22/2017 0925   ALKPHOS 56 03/22/2017 0925   BILITOT 0.50 03/22/2017 0925   GFRNONAA >60 07/23/2016 0550   GFRAA >60 07/23/2016 0550    INo results found for: SPEP, UPEP  Lab Results  Component Value Date   WBC 2.4 (L) 04/25/2017   NEUTROABS 0.9 (L) 04/25/2017   HGB 11.5 (L) 04/25/2017   HCT 34.3 (L)  04/25/2017   MCV 90.1 04/25/2017   PLT 154 04/25/2017      Chemistry      Component  Value Date/Time   NA 141 03/22/2017 0925   K 4.3 03/22/2017 0925   CL 107 07/23/2016 0550   CO2 25 03/22/2017 0925   BUN 9.1 03/22/2017 0925   CREATININE 0.8 03/22/2017 0925      Component Value Date/Time   CALCIUM 9.0 03/22/2017 0925   ALKPHOS 56 03/22/2017 0925   AST 15 03/22/2017 0925   ALT 16 03/22/2017 0925   BILITOT 0.50 03/22/2017 0925       No results found for: LABCA2  No components found for: LABCA125  No results for input(s): INR in the last 168 hours.  Urinalysis    Component Value Date/Time   COLORURINE YELLOW 07/22/2016 1132   APPEARANCEUR HAZY (A) 07/22/2016 1132   LABSPEC 1.013 07/22/2016 1132   PHURINE 5.0 07/22/2016 1132   GLUCOSEU NEGATIVE 07/22/2016 1132   GLUCOSEU NEGATIVE 10/30/2014 1513   HGBUR MODERATE (A) 07/22/2016 1132   BILIRUBINUR NEGATIVE 07/22/2016 1132   KETONESUR 5 (A) 07/22/2016 1132   PROTEINUR NEGATIVE 07/22/2016 1132   UROBILINOGEN 0.2 10/30/2014 1513   NITRITE NEGATIVE 07/22/2016 1132   LEUKOCYTESUR LARGE (A) 07/22/2016 1132     STUDIES: No results found.   ELIGIBLE FOR AVAILABLE RESEARCH PROTOCOL: Multiple, as noted below  ASSESSMENT: 71 y.o. [place] woman with a remote history of early stage endometrial cancer, now status post right breast upper outer quadrant biopsy 01/22/2016 for a clinically multifocal T2 N0, stage 2A invasive ductal carcinoma, grade 1, estrogen and progesterone receptor positive, HER-2 negative, with an MIB-1 between 10 and 15%.  (1) right axillary lymph node biopsy 03/02/2016 positive  (2) genetics testing 01/13/2016 through the Custom gene panel offered by [facility] found no deleterious mutations in  ATM, BARD1, BRCA1, BRCA2, BRIP1, CDH1, CHEK2, EPCAM, FANCC, MLH1, MSH2, MSH6, MUTYH, NBN, PALB2, PMS2, POLD1, PTEN, RAD51C, RAD51D, TP53, and XRCC2  METASTATIC DISEASE: OCT 2017 (3) CT scans of the chest  abdomen and pelvis obtained 03/10/2016 are consistent with bilateral lung metastases and mediastinal and hilar nodal involvement, but no liver or bone spread  (a) bronchoscopic lymph node biopsy 2 (station 7, 13R) 03/28/2016 confirms metastatic adenocarcinoma, estrogen receptor positive, HER-2 not amplified  (b) baseline CA-27-29 on 04/18/2016 was 137.5.  (4) letrozole started 03/15/2016, palbociclib added 03/29/2016 at 125 mg/day, 21/7  (a) dose decreased to 100 mg per day, 21/7, beginning with February cycle  (b) palbociclib held 01/31/2017, with increasing symptoms  (c) palbociclib resumed October 2018 at 75 mg daily  (5) status post double right lumpectomies and right axillary lymph node sampling 01/13/2017 for 2 separate invasive ductal carcinoma lesions, pT1a and pT1b, N1a, with negative margins, both lesions being estrogen and progesterone receptor positive and HER-2 negative  (6) adjuvant radiation ending   PLAN: Sharnette is finally ready to start her adjuvant radiation.  Normally we would have started palbociclib today, but her counts remain low and we are going to give her 1 additional week.  If her counts are still low a week from today we will proceed with palbociclib but drop the dose to 75 mg every other day instead of daily.  I think her fatigue likely is due to her recent infections.  If she is able to start a walking program that will help her get out of the hole a little faster.  Of course radiation also causes fatigue and that is likely going to complicate management of that particular symptom.  If her counts fail to recover adequately we will have to consider switching to  a different ciclib.   I am going to check her labs in a week and she will see me again May 22, 2017.  She knows to call for any other issues that may develop before the next visit.   Magrinat, Virgie Dad, MD  04/25/17 9:54 AM Medical Oncology and Hematology [facility] [address], [place] [address] Tel. [phone]    Fax. [phone]  This document serves as a record of services personally performed by Lurline Del, MD. It was created on his behalf by Sheron Nightingale, a trained medical scribe. The creation of this record is based on the scribe's personal observations and the provider's statements to them.    I have reviewed the above documentation for accuracy and completeness, and I agree with the above.

## 2017-04-25 ENCOUNTER — Ambulatory Visit
Admission: RE | Admit: 2017-04-25 | Discharge: 2017-04-25 | Disposition: A | Discharge status: Home / Self Care | Payer: Medicare Other | Source: Ambulatory Visit | Attending: Radiation Oncology | Admitting: Radiation Oncology

## 2017-04-25 ENCOUNTER — Telehealth: Payer: Self-pay | Admitting: Oncology

## 2017-04-25 ENCOUNTER — Ambulatory Visit: Admission: RE | Admit: 2017-04-25 | Payer: Medicare Other | Source: Ambulatory Visit | Admitting: Radiation Oncology

## 2017-04-25 ENCOUNTER — Other Ambulatory Visit (HOSPITAL_BASED_OUTPATIENT_CLINIC_OR_DEPARTMENT_OTHER): Payer: Medicare Other

## 2017-04-25 ENCOUNTER — Encounter: Payer: Self-pay | Admitting: Radiation Oncology

## 2017-04-25 ENCOUNTER — Other Ambulatory Visit: Payer: Self-pay

## 2017-04-25 ENCOUNTER — Ambulatory Visit (HOSPITAL_BASED_OUTPATIENT_CLINIC_OR_DEPARTMENT_OTHER): Payer: Medicare Other | Admitting: Oncology

## 2017-04-25 VITALS — BP 168/70 | HR 64 | Temp 97.6°F | Resp 20 | Ht 64.0 in | Wt 233.7 lb

## 2017-04-25 VITALS — BP 168/70 | HR 64 | Temp 97.6°F | Resp 24 | Ht 64.0 in | Wt 233.7 lb

## 2017-04-25 DIAGNOSIS — Z801 Family history of malignant neoplasm of trachea, bronchus and lung: Secondary | ICD-10-CM | POA: Diagnosis not present

## 2017-04-25 DIAGNOSIS — C7801 Secondary malignant neoplasm of right lung: Secondary | ICD-10-CM

## 2017-04-25 DIAGNOSIS — M1610 Unilateral primary osteoarthritis, unspecified hip: Secondary | ICD-10-CM | POA: Insufficient documentation

## 2017-04-25 DIAGNOSIS — Z96643 Presence of artificial hip joint, bilateral: Secondary | ICD-10-CM | POA: Insufficient documentation

## 2017-04-25 DIAGNOSIS — C50411 Malignant neoplasm of upper-outer quadrant of right female breast: Secondary | ICD-10-CM

## 2017-04-25 DIAGNOSIS — C773 Secondary and unspecified malignant neoplasm of axilla and upper limb lymph nodes: Secondary | ICD-10-CM

## 2017-04-25 DIAGNOSIS — Z9889 Other specified postprocedural states: Secondary | ICD-10-CM | POA: Diagnosis not present

## 2017-04-25 DIAGNOSIS — K760 Fatty (change of) liver, not elsewhere classified: Secondary | ICD-10-CM

## 2017-04-25 DIAGNOSIS — Z8249 Family history of ischemic heart disease and other diseases of the circulatory system: Secondary | ICD-10-CM | POA: Insufficient documentation

## 2017-04-25 DIAGNOSIS — Z7982 Long term (current) use of aspirin: Secondary | ICD-10-CM | POA: Insufficient documentation

## 2017-04-25 DIAGNOSIS — Z90710 Acquired absence of both cervix and uterus: Secondary | ICD-10-CM | POA: Insufficient documentation

## 2017-04-25 DIAGNOSIS — Z9221 Personal history of antineoplastic chemotherapy: Secondary | ICD-10-CM | POA: Insufficient documentation

## 2017-04-25 DIAGNOSIS — Z8542 Personal history of malignant neoplasm of other parts of uterus: Secondary | ICD-10-CM

## 2017-04-25 DIAGNOSIS — Z888 Allergy status to other drugs, medicaments and biological substances status: Secondary | ICD-10-CM | POA: Insufficient documentation

## 2017-04-25 DIAGNOSIS — C7802 Secondary malignant neoplasm of left lung: Secondary | ICD-10-CM

## 2017-04-25 DIAGNOSIS — Z170 Estrogen receptor positive status [ER+]: Secondary | ICD-10-CM

## 2017-04-25 DIAGNOSIS — Z79899 Other long term (current) drug therapy: Secondary | ICD-10-CM | POA: Diagnosis not present

## 2017-04-25 DIAGNOSIS — C7800 Secondary malignant neoplasm of unspecified lung: Secondary | ICD-10-CM

## 2017-04-25 DIAGNOSIS — Z86718 Personal history of other venous thrombosis and embolism: Secondary | ICD-10-CM | POA: Insufficient documentation

## 2017-04-25 DIAGNOSIS — E119 Type 2 diabetes mellitus without complications: Secondary | ICD-10-CM | POA: Insufficient documentation

## 2017-04-25 DIAGNOSIS — L7682 Other postprocedural complications of skin and subcutaneous tissue: Secondary | ICD-10-CM | POA: Diagnosis not present

## 2017-04-25 DIAGNOSIS — Z882 Allergy status to sulfonamides status: Secondary | ICD-10-CM | POA: Diagnosis not present

## 2017-04-25 DIAGNOSIS — Z885 Allergy status to narcotic agent status: Secondary | ICD-10-CM | POA: Diagnosis not present

## 2017-04-25 DIAGNOSIS — I10 Essential (primary) hypertension: Secondary | ICD-10-CM | POA: Insufficient documentation

## 2017-04-25 DIAGNOSIS — G4733 Obstructive sleep apnea (adult) (pediatric): Secondary | ICD-10-CM | POA: Insufficient documentation

## 2017-04-25 DIAGNOSIS — Z820 Family history of epilepsy and other diseases of the nervous system: Secondary | ICD-10-CM | POA: Diagnosis not present

## 2017-04-25 DIAGNOSIS — Z823 Family history of stroke: Secondary | ICD-10-CM | POA: Diagnosis not present

## 2017-04-25 DIAGNOSIS — Z79811 Long term (current) use of aromatase inhibitors: Secondary | ICD-10-CM | POA: Diagnosis not present

## 2017-04-25 DIAGNOSIS — Z9049 Acquired absence of other specified parts of digestive tract: Secondary | ICD-10-CM | POA: Insufficient documentation

## 2017-04-25 DIAGNOSIS — Z803 Family history of malignant neoplasm of breast: Secondary | ICD-10-CM | POA: Diagnosis not present

## 2017-04-25 LAB — CBC WITH DIFFERENTIAL/PLATELET
BASO%: 0.9 % (ref 0.0–2.0)
Basophils Absolute: 0 10*3/uL (ref 0.0–0.1)
EOS%: 0.9 % (ref 0.0–7.0)
Eosinophils Absolute: 0 10*3/uL (ref 0.0–0.5)
HCT: 34.3 % — ABNORMAL LOW (ref 34.8–46.6)
HGB: 11.5 g/dL — ABNORMAL LOW (ref 11.6–15.9)
LYMPH%: 48.2 % (ref 14.0–49.7)
MCH: 30.2 pg (ref 25.1–34.0)
MCHC: 33.5 g/dL (ref 31.5–36.0)
MCV: 90.1 fL (ref 79.5–101.0)
MONO#: 0.3 10*3/uL (ref 0.1–0.9)
MONO%: 10.7 % (ref 0.0–14.0)
NEUT#: 0.9 10*3/uL — ABNORMAL LOW (ref 1.5–6.5)
NEUT%: 39.3 % (ref 38.4–76.8)
Platelets: 154 10*3/uL (ref 145–400)
RBC: 3.8 10*6/uL (ref 3.70–5.45)
RDW: 19 % — ABNORMAL HIGH (ref 11.2–14.5)
WBC: 2.4 10*3/uL — ABNORMAL LOW (ref 3.9–10.3)
lymph#: 1.2 10*3/uL (ref 0.9–3.3)

## 2017-04-25 LAB — COMPREHENSIVE METABOLIC PANEL
ALT: 15 U/L (ref 0–55)
AST: 11 U/L (ref 5–34)
Albumin: 3.9 g/dL (ref 3.5–5.0)
Alkaline Phosphatase: 60 U/L (ref 40–150)
Anion Gap: 7 mEq/L (ref 3–11)
BUN: 11 mg/dL (ref 7.0–26.0)
CO2: 26 mEq/L (ref 22–29)
Calcium: 9.2 mg/dL (ref 8.4–10.4)
Chloride: 108 mEq/L (ref 98–109)
Creatinine: 0.8 mg/dL (ref 0.6–1.1)
EGFR: >60 mL/min/{1.73_m2} (ref >60)
Glucose: 207 mg/dl — ABNORMAL HIGH (ref 70–140)
Potassium: 4 mEq/L (ref 3.5–5.1)
Sodium: 141 mEq/L (ref 136–145)
Total Bilirubin: 0.49 mg/dL (ref 0.20–1.20)
Total Protein: 6.8 g/dL (ref 6.4–8.3)

## 2017-04-25 NOTE — Progress Notes (Signed)
Radiation Oncology         [phone]) [phone] ________________________________  Name: [name] MRN: [id number]  Date: 04/25/2017  DOB: September 26, 1945  BS:WHQP, Hunt Oris, MD  Alphonsa Overall, MD     REFERRING PHYSICIAN: Alphonsa Overall, MD   DIAGNOSIS: The encounter diagnosis was Malignant neoplasm of upper-outer quadrant of right breast in female, estrogen receptor positive ([place]).   HISTORY OF PRESENT ILLNESS: [name] is a 71 y.o. female originally seen in August 2017 in the multidiscplinary breast clinic for a new diagnosis of multifocal breast cancer. She was found to have distortion on her screening mammogram and diagnostic imaging was performed as well as biopsy on 01/22/16. Findings in the right breast revealed a 2 x 1.5 x 1.6 cm mass at 12 o'clock, as well as a second mass in the same location measuring 8 x 7 x 8 mm, and a 8 mm lesion at 4 o'clock. The dominant mass and the 4 o'clock mass were biopsied and both showed ER/PR positive, HER 2 negative invasive ductal carcinoma. She underwent breast MRI in September 2017 which revealed multifocal disease, and a 1.7 cm area of adenopathy in the right axilla. She subsequently underwent biopsy of this on 03/02/16 which revealed metastatic carcinoma consistent with her breast disease. Additional staging work up revealed widespread pulmonary metastasis and nodal disease in the mediastinum. She completed chemotherapy and had an excellent response with no persistent pulmonary or nodal disease on subsequent imaging.   She agreed radiotherapy to the breast and regional nodes would be appropriate, she subsequently had a delay in her surgery due to an eye procedure, but ultimately underwent right lumpectomy with axillary lymph node dissection on 01/13/2017.  Final pathology revealed a grade 1, 9 mm invasive ductal carcinoma at 4:00, and a 1.5 cm and 5 mm site of invasive ductal carcinoma at 12:00, and 3/5 axillary nodes were also positive for disease. Her  postoperative course has been somewhat complicated with postoperative infections requiring I&D with packing. She comes today to reestablish herself with plans to undergo simulation.   PREVIOUS RADIATION THERAPY: No   PAST MEDICAL HISTORY:  Past Medical History:  Diagnosis Date  . Breast cancer ([place])   . Cancer ([place]) 02/2016   right breast  . DIABETES MELLITUS, TYPE II 01/04/2007   only takes actoplus daily  . Dizziness and giddiness 02/29/2008  . DVT, HX OF    at age 1 in right buttocks  . Dyspnea    due to lung cancer  . Family history of breast cancer   . GERD 01/04/2007   pt reports resolved   . GLAUCOMA 07/30/2008   both eyes  . History of blood transfusion    no abnormal  reaction  . History of uterine cancer 2000   hysterectomy done  . HYPERLIPIDEMIA 01/04/2007   taking Pravastatin daily  . HYPERTENSION 01/04/2007   takes Lisinopril daily  . Joint pain   . Joint swelling   . Leg cramps   . LEG PAIN, LEFT 07/06/2007  . NUMBNESS 07/30/2008   in fingers;pt states from Diamox  . OSTEOARTHRITIS, HIP 09/25/2009  . OTITIS MEDIA, ACUTE, BILATERAL 02/29/2008  . Overweight(278.02) 01/04/2007  . Peripheral vascular disease ([place])   . Pneumonia   . PONV (postoperative nausea and vomiting)   . SLEEP APNEA, OBSTRUCTIVE    doesn't use a cpap;study done about 12yr ago  . TRANSIENT ISCHEMIC ATTACK, HX OF 01/04/2007  . Vision loss    left eye  PAST SURGICAL HISTORY: Past Surgical History:  Procedure Laterality Date  . ABDOMINAL HYSTERECTOMY  2000  . CHOLECYSTECTOMY    . EYE SURGERY  13   shunt left and lazer eye surgery on right cataract and retenia tear with repair  . growth removal  2004   from thumb  . KNEE ARTHROSCOPY Right   . mulitple eye surgeries     both eyes, cataracts with ioc done both eyes  . OOPHORECTOMY    . right lumpectomy with axillary node dissection Right 01/2017  . TOTAL HIP ARTHROPLASTY Left 11/12/2012   Dr Mayer Camel     FAMILY HISTORY:    Family History  Problem Relation Age of Onset  . Dementia Mother   . Cancer Mother        Breast and lung cancer  . Stroke Sister   . Breast cancer Sister 54  . Heart attack Maternal Aunt   . Lung cancer Maternal Grandmother        non smoker  . Glaucoma Maternal Grandfather   . Anesthesia problems Neg Hx      SOCIAL HISTORY:  reports that  has never smoked. she has never used smokeless tobacco. She reports that she does not drink alcohol or use drugs. She is married and resides in [place]. She is retired and spends much of her time with a young granddaughter.   ALLERGIES: Codeine; Fluorescein; Lipitor [atorvastatin calcium]; Oxycodone; Sitagliptin phosphate; and Sulfa drugs cross reactors   MEDICATIONS:  Current Outpatient Medications  Medication Sig Dispense Refill  . acetaminophen (TYLENOL) 500 MG tablet Take 1,000 mg by mouth every 4 (four) hours as needed for moderate pain or fever.    Marland Kitchen aspirin EC 81 MG tablet Take 81 mg by mouth daily at 6 PM. 1700    . bimatoprost (LUMIGAN) 0.01 % SOLN Place 1 drop into both eyes at bedtime.    . brimonidine-timolol (COMBIGAN) 0.2-0.5 % ophthalmic solution Place 1 drop into both eyes three times daily    . cholecalciferol (VITAMIN D) 1000 units tablet Take 1,000 Units by mouth daily.    . dorzolamide-timolol (COSOPT) 22.3-6.8 MG/ML ophthalmic solution Place 1 drop into both eyes 2 (two) times daily.    Marland Kitchen letrozole (FEMARA) 2.5 MG tablet Take 1 tablet (2.5 mg total) by mouth at bedtime.    Marland Kitchen lisinopril (PRINIVIL,ZESTRIL) 20 MG tablet Take 1 tablet (20 mg total) by mouth daily. (Patient taking differently: Take 20 mg by mouth daily at 6 PM. 1700) 90 tablet 3  . lovastatin (MEVACOR) 20 MG tablet Take 1 tablet (20 mg total) by mouth every evening. (Patient taking differently: Take 20 mg by mouth daily at 6 PM. 1700) 90 tablet 3  . pioglitazone-metformin (ACTOPLUS MET) 15-500 MG tablet Take 15-500 tablets by mouth daily. (Patient taking  differently: Take 1 tablet by mouth daily at 6 PM. 1700) 90 tablet 3  . IBRANCE 75 MG capsule TAKE 1 CAPSULE (75 MG TOTAL) BY MOUTH DAILY WITH BREAKFAST. TAKE WHOLE WITH FOOD. (Patient not taking: Reported on 04/25/2017) 21 capsule 0  . prochlorperazine (COMPAZINE) 10 MG tablet Take 1 tablet (10 mg total) by mouth every 6 (six) hours as needed for nausea or vomiting. (Patient not taking: Reported on 04/25/2017) 30 tablet 0   No current facility-administered medications for this encounter.      REVIEW OF SYSTEMS: On review of systems, the patient reports that she is doing well overall. She denies any chest pain, shortness of breath, cough, fevers, chills,  night sweats, unintended weight changes. She reports her breast is healing well especially in the last 2 weeks. She reports only minimal opening of the upper breast lumpectomy site. She is not having to pack this at this time. She denies any bowel or bladder disturbances, and denies abdominal pain, nausea or vomiting. She denies any new musculoskeletal or joint aches or pains, new skin lesions or concerns. A complete review of systems is obtained and is otherwise negative.    PHYSICAL EXAM: Wt Readings from Last 3 Encounters:  04/25/17 233 lb 11.2 oz (106 kg)  04/25/17 233 lb 11.2 oz (106 kg)  03/22/17 231 lb 4.8 oz (104.9 kg)   Temp Readings from Last 3 Encounters:  04/25/17 97.6 F (36.4 C) (Oral)  04/25/17 97.6 F (36.4 C) (Oral)  03/22/17 98.4 F (36.9 C) (Oral)   BP Readings from Last 3 Encounters:  04/25/17 (!) 168/70  04/25/17 (!) 168/70  03/22/17 (!) 174/62   Pulse Readings from Last 3 Encounters:  04/25/17 64  04/25/17 64  03/22/17 68    Pain scale 0/10 In general this is a well appearing Caucasian female in no acute distress. She's alert and oriented x4 and appropriate throughout the examination. Cardiopulmonary assessment is negative for acute distress and She exhibits normal effort. The right breast is examined and  reveals a well healed axillary incision in the right axilla, and well healed incision at the base of the areola around 8:00 position. There is also an incision at 12:00 that has separation at the middle that has 5-6 mm of separation. There is induration deep within the breast consistent with her prior postop and postop infectious changes, without concerns for fluctuance or active infection. No chest wall or RUE edema is present.   ECOG = 0  0 - Asymptomatic (Fully active, able to carry on all predisease activities without restriction)  1 - Symptomatic but completely ambulatory (Restricted in physically strenuous activity but ambulatory and able to carry out work of a light or sedentary nature. For example, light housework, office work)  2 - Symptomatic, <50% in bed during the day (Ambulatory and capable of all self care but unable to carry out any work activities. Up and about more than 50% of waking hours)  3 - Symptomatic, >50% in bed, but not bedbound (Capable of only limited self-care, confined to bed or chair 50% or more of waking hours)  4 - Bedbound (Completely disabled. Cannot carry on any self-care. Totally confined to bed or chair)  5 - Death   Eustace Pen MM, Creech RH, Tormey DC, et al. [phone]). "Toxicity and response criteria of the [facility]". [place] Oncol. 5 (6): 649-55    LABORATORY DATA:  Lab Results  Component Value Date   WBC 2.4 (L) 04/25/2017   HGB 11.5 (L) 04/25/2017   HCT 34.3 (L) 04/25/2017   MCV 90.1 04/25/2017   PLT 154 04/25/2017   Lab Results  Component Value Date   NA 141 04/25/2017   K 4.0 04/25/2017   CL 107 07/23/2016   CO2 26 04/25/2017   Lab Results  Component Value Date   ALT 15 04/25/2017   AST 11 04/25/2017   ALKPHOS 60 04/25/2017   BILITOT 0.49 04/25/2017      RADIOGRAPHY: No results found.     IMPRESSION/PLAN: 1. Metastatic, ER/PR positive invasive ductal carcinoma of the right breast. Dr. Lisbeth Renshaw discusses  the pathology findings and reviews the nature of invasive breast disease.  We  reviewed that since she had an excellent response to her systemic therapy, it certainly reasonable to continue the plan of being aggressive locally.  She has undergone surgical resection, and has done quite well since this time.  She is back on letrozole and ibrance and tolerating this well. Dr. Lisbeth Renshaw recommends no moving forward with adjuvant radiotherapy, and reviews the risks, benefits, short and long-term effects as well as the delivery and logistics of radiotherapy.  He would offer her 6 1/2 weeks of radiotherapy to either the breast and of the regional nodes. Although we would like to proceed with simulation, given her persistent healing, we would like to hold off on simulation until 05/09/17. Written consent is obtained and placed in the chart, a copy was provided to the patient.  In a visit lasting 25 minutes, greater than 50% of the time was spent face to face discussing options of radiotherapy including side effects, and coordinating the patient's care.   The above documentation reflects my direct findings during this shared patient visit. Please see the separate note by Dr. Lisbeth Renshaw on this date for the remainder of the patient's plan of care.    Carola Rhine, PAC

## 2017-04-25 NOTE — Addendum Note (Signed)
Encounter addended by: Malena Edman, RN on: 04/25/2017 3:00 PM  Actions taken: Charge Capture section accepted

## 2017-04-25 NOTE — Telephone Encounter (Signed)
Gave patient avs and calendar with appts per 11/13 los.

## 2017-04-26 ENCOUNTER — Telehealth: Payer: Self-pay | Admitting: Oncology

## 2017-04-26 ENCOUNTER — Telehealth: Payer: Self-pay | Admitting: *Deleted

## 2017-04-26 ENCOUNTER — Telehealth: Payer: Self-pay | Admitting: Pharmacy Technician

## 2017-04-26 LAB — CANCER ANTIGEN 27.29: CA 27.29: 18.2 U/mL (ref 0.0–38.6)

## 2017-04-26 NOTE — Telephone Encounter (Signed)
Spoke with patient regarding appt that was changed due to their request in 11/14 sch msg

## 2017-04-26 NOTE — Telephone Encounter (Signed)
"  I need to reschedule my lab appointment to 9:00 am instead of 1:00 pm.  Message left yesterday with no return call yet."  Scheduling message sent with this request.  Denies further needs or questions at this time.

## 2017-04-26 NOTE — Telephone Encounter (Signed)
Oral Oncology Patient Advocate Encounter  Met patient in [place] to complete a renewal application for [employer] in an effort to keep the patient's out of pocket expense for Ibrance at $0.    Application completed and faxed to [phone].   [employer] patient assistance phone number for follow up is [phone].   This encounter will be updated until final determination.  Fabio Asa. [name], [place] [facility] [phone] 04/26/2017 3:39 PM

## 2017-04-27 ENCOUNTER — Other Ambulatory Visit: Payer: Self-pay | Admitting: *Deleted

## 2017-05-02 ENCOUNTER — Other Ambulatory Visit (HOSPITAL_BASED_OUTPATIENT_CLINIC_OR_DEPARTMENT_OTHER): Payer: Medicare Other

## 2017-05-02 DIAGNOSIS — C7801 Secondary malignant neoplasm of right lung: Secondary | ICD-10-CM

## 2017-05-02 DIAGNOSIS — C50411 Malignant neoplasm of upper-outer quadrant of right female breast: Secondary | ICD-10-CM

## 2017-05-02 DIAGNOSIS — Z8542 Personal history of malignant neoplasm of other parts of uterus: Secondary | ICD-10-CM

## 2017-05-02 DIAGNOSIS — Z170 Estrogen receptor positive status [ER+]: Secondary | ICD-10-CM

## 2017-05-02 LAB — CBC WITH DIFFERENTIAL/PLATELET
BASO%: 0.8 % (ref 0.0–2.0)
Basophils Absolute: 0 10*3/uL (ref 0.0–0.1)
EOS%: 1.3 % (ref 0.0–7.0)
Eosinophils Absolute: 0.1 10*3/uL (ref 0.0–0.5)
HCT: 36.1 % (ref 34.8–46.6)
HGB: 12 g/dL (ref 11.6–15.9)
LYMPH%: 40.3 % (ref 14.0–49.7)
MCH: 30.3 pg (ref 25.1–34.0)
MCHC: 33.2 g/dL (ref 31.5–36.0)
MCV: 91.2 fL (ref 79.5–101.0)
MONO#: 0.5 10*3/uL (ref 0.1–0.9)
MONO%: 12.6 % (ref 0.0–14.0)
NEUT#: 1.8 10*3/uL (ref 1.5–6.5)
NEUT%: 45 % (ref 38.4–76.8)
Platelets: 178 10*3/uL (ref 145–400)
RBC: 3.96 10*6/uL (ref 3.70–5.45)
RDW: 16.6 % — ABNORMAL HIGH (ref 11.2–14.5)
WBC: 3.9 10*3/uL (ref 3.9–10.3)
lymph#: 1.6 10*3/uL (ref 0.9–3.3)

## 2017-05-09 ENCOUNTER — Ambulatory Visit: Admission: RE | Admit: 2017-05-09 | Payer: Medicare Other | Source: Ambulatory Visit | Admitting: Radiation Oncology

## 2017-05-09 ENCOUNTER — Ambulatory Visit
Admission: RE | Admit: 2017-05-09 | Discharge: 2017-05-09 | Disposition: A | Discharge status: Home / Self Care | Payer: Medicare Other | Source: Ambulatory Visit | Attending: Radiation Oncology | Admitting: Radiation Oncology

## 2017-05-09 DIAGNOSIS — Z86718 Personal history of other venous thrombosis and embolism: Secondary | ICD-10-CM | POA: Diagnosis not present

## 2017-05-09 DIAGNOSIS — Z170 Estrogen receptor positive status [ER+]: Secondary | ICD-10-CM | POA: Diagnosis not present

## 2017-05-09 DIAGNOSIS — C7800 Secondary malignant neoplasm of unspecified lung: Secondary | ICD-10-CM | POA: Diagnosis not present

## 2017-05-09 DIAGNOSIS — Z9221 Personal history of antineoplastic chemotherapy: Secondary | ICD-10-CM | POA: Diagnosis not present

## 2017-05-09 DIAGNOSIS — E119 Type 2 diabetes mellitus without complications: Secondary | ICD-10-CM | POA: Diagnosis not present

## 2017-05-09 DIAGNOSIS — C50411 Malignant neoplasm of upper-outer quadrant of right female breast: Secondary | ICD-10-CM | POA: Diagnosis not present

## 2017-05-11 DIAGNOSIS — Z9221 Personal history of antineoplastic chemotherapy: Secondary | ICD-10-CM | POA: Diagnosis not present

## 2017-05-11 DIAGNOSIS — C7800 Secondary malignant neoplasm of unspecified lung: Secondary | ICD-10-CM | POA: Diagnosis not present

## 2017-05-11 DIAGNOSIS — Z170 Estrogen receptor positive status [ER+]: Secondary | ICD-10-CM | POA: Diagnosis not present

## 2017-05-11 DIAGNOSIS — Z86718 Personal history of other venous thrombosis and embolism: Secondary | ICD-10-CM | POA: Diagnosis not present

## 2017-05-11 DIAGNOSIS — C50411 Malignant neoplasm of upper-outer quadrant of right female breast: Secondary | ICD-10-CM | POA: Diagnosis not present

## 2017-05-11 DIAGNOSIS — E119 Type 2 diabetes mellitus without complications: Secondary | ICD-10-CM | POA: Diagnosis not present

## 2017-05-16 ENCOUNTER — Ambulatory Visit
Admission: RE | Admit: 2017-05-16 | Discharge: 2017-05-16 | Disposition: A | Discharge status: Home / Self Care | Payer: Medicare Other | Source: Ambulatory Visit | Attending: Radiation Oncology | Admitting: Radiation Oncology

## 2017-05-16 DIAGNOSIS — Z170 Estrogen receptor positive status [ER+]: Secondary | ICD-10-CM | POA: Diagnosis not present

## 2017-05-16 DIAGNOSIS — C7800 Secondary malignant neoplasm of unspecified lung: Secondary | ICD-10-CM | POA: Diagnosis not present

## 2017-05-16 DIAGNOSIS — Z9221 Personal history of antineoplastic chemotherapy: Secondary | ICD-10-CM | POA: Diagnosis not present

## 2017-05-16 DIAGNOSIS — Z86718 Personal history of other venous thrombosis and embolism: Secondary | ICD-10-CM | POA: Diagnosis not present

## 2017-05-16 DIAGNOSIS — E119 Type 2 diabetes mellitus without complications: Secondary | ICD-10-CM | POA: Diagnosis not present

## 2017-05-16 DIAGNOSIS — C50411 Malignant neoplasm of upper-outer quadrant of right female breast: Secondary | ICD-10-CM | POA: Diagnosis not present

## 2017-05-17 ENCOUNTER — Ambulatory Visit
Admission: RE | Admit: 2017-05-17 | Discharge: 2017-05-17 | Disposition: A | Discharge status: Home / Self Care | Payer: Medicare Other | Source: Ambulatory Visit | Attending: Radiation Oncology | Admitting: Radiation Oncology

## 2017-05-17 DIAGNOSIS — Z170 Estrogen receptor positive status [ER+]: Secondary | ICD-10-CM | POA: Diagnosis not present

## 2017-05-17 DIAGNOSIS — Z9221 Personal history of antineoplastic chemotherapy: Secondary | ICD-10-CM | POA: Diagnosis not present

## 2017-05-17 DIAGNOSIS — C7800 Secondary malignant neoplasm of unspecified lung: Secondary | ICD-10-CM | POA: Diagnosis not present

## 2017-05-17 DIAGNOSIS — E119 Type 2 diabetes mellitus without complications: Secondary | ICD-10-CM | POA: Diagnosis not present

## 2017-05-17 DIAGNOSIS — Z86718 Personal history of other venous thrombosis and embolism: Secondary | ICD-10-CM | POA: Diagnosis not present

## 2017-05-17 DIAGNOSIS — C50411 Malignant neoplasm of upper-outer quadrant of right female breast: Secondary | ICD-10-CM | POA: Diagnosis not present

## 2017-05-18 ENCOUNTER — Ambulatory Visit
Admission: RE | Admit: 2017-05-18 | Discharge: 2017-05-18 | Disposition: A | Discharge status: Home / Self Care | Payer: Medicare Other | Source: Ambulatory Visit | Attending: Radiation Oncology | Admitting: Radiation Oncology

## 2017-05-18 DIAGNOSIS — Z170 Estrogen receptor positive status [ER+]: Secondary | ICD-10-CM | POA: Diagnosis not present

## 2017-05-18 DIAGNOSIS — E119 Type 2 diabetes mellitus without complications: Secondary | ICD-10-CM | POA: Diagnosis not present

## 2017-05-18 DIAGNOSIS — Z86718 Personal history of other venous thrombosis and embolism: Secondary | ICD-10-CM | POA: Diagnosis not present

## 2017-05-18 DIAGNOSIS — Z9221 Personal history of antineoplastic chemotherapy: Secondary | ICD-10-CM | POA: Diagnosis not present

## 2017-05-18 DIAGNOSIS — C7800 Secondary malignant neoplasm of unspecified lung: Secondary | ICD-10-CM | POA: Diagnosis not present

## 2017-05-18 DIAGNOSIS — C50411 Malignant neoplasm of upper-outer quadrant of right female breast: Secondary | ICD-10-CM | POA: Diagnosis not present

## 2017-05-19 ENCOUNTER — Ambulatory Visit
Admission: RE | Admit: 2017-05-19 | Discharge: 2017-05-19 | Disposition: A | Discharge status: Home / Self Care | Payer: Medicare Other | Source: Ambulatory Visit | Attending: Radiation Oncology | Admitting: Radiation Oncology

## 2017-05-19 DIAGNOSIS — C50411 Malignant neoplasm of upper-outer quadrant of right female breast: Secondary | ICD-10-CM

## 2017-05-19 DIAGNOSIS — E119 Type 2 diabetes mellitus without complications: Secondary | ICD-10-CM | POA: Diagnosis not present

## 2017-05-19 DIAGNOSIS — Z170 Estrogen receptor positive status [ER+]: Secondary | ICD-10-CM | POA: Diagnosis not present

## 2017-05-19 DIAGNOSIS — C7800 Secondary malignant neoplasm of unspecified lung: Secondary | ICD-10-CM | POA: Diagnosis not present

## 2017-05-19 DIAGNOSIS — Z9221 Personal history of antineoplastic chemotherapy: Secondary | ICD-10-CM | POA: Diagnosis not present

## 2017-05-19 DIAGNOSIS — Z86718 Personal history of other venous thrombosis and embolism: Secondary | ICD-10-CM | POA: Diagnosis not present

## 2017-05-19 MED ORDER — ALRA NON-METALLIC DEODORANT (RAD-ONC)
1.0000 "application " | Freq: Once | TOPICAL | Status: AC
  Administered 2017-05-19: 1 via TOPICAL

## 2017-05-19 MED ORDER — RADIAPLEXRX EX GEL
Freq: Once | CUTANEOUS | Status: AC
  Administered 2017-05-19: 17:00:00 via TOPICAL

## 2017-05-19 NOTE — Progress Notes (Signed)
Patient education done, my business card, Alra deodorant  and radiaplex given, discussed side effects, skin irritation,pain, swelling, of breast, fatigue, apply radiaplex after rad tx and bedtime daily for moisturizing and soothing skin, alra  after rad tx and prn, luke warm shower/bath,no rubbing,scrubbing, or scratching treated area, unscented soap such as dove,mild, pat dry, no under wire bra if possible, electric shaver only for axilla increase protein in diet, stay hydrated,drink plenty water, sees MD weekly and prn, teach back given

## 2017-05-22 ENCOUNTER — Ambulatory Visit: Payer: Medicare Other

## 2017-05-22 ENCOUNTER — Other Ambulatory Visit: Payer: Medicare Other

## 2017-05-22 ENCOUNTER — Ambulatory Visit: Payer: Medicare Other | Admitting: Oncology

## 2017-05-23 ENCOUNTER — Ambulatory Visit
Admission: RE | Admit: 2017-05-23 | Discharge: 2017-05-23 | Disposition: A | Discharge status: Home / Self Care | Payer: Medicare Other | Source: Ambulatory Visit | Attending: Radiation Oncology | Admitting: Radiation Oncology

## 2017-05-23 DIAGNOSIS — Z9221 Personal history of antineoplastic chemotherapy: Secondary | ICD-10-CM | POA: Diagnosis not present

## 2017-05-23 DIAGNOSIS — Z86718 Personal history of other venous thrombosis and embolism: Secondary | ICD-10-CM | POA: Diagnosis not present

## 2017-05-23 DIAGNOSIS — E119 Type 2 diabetes mellitus without complications: Secondary | ICD-10-CM | POA: Diagnosis not present

## 2017-05-23 DIAGNOSIS — Z170 Estrogen receptor positive status [ER+]: Secondary | ICD-10-CM | POA: Diagnosis not present

## 2017-05-23 DIAGNOSIS — C7800 Secondary malignant neoplasm of unspecified lung: Secondary | ICD-10-CM | POA: Diagnosis not present

## 2017-05-23 DIAGNOSIS — C50411 Malignant neoplasm of upper-outer quadrant of right female breast: Secondary | ICD-10-CM | POA: Diagnosis not present

## 2017-05-23 NOTE — Progress Notes (Signed)
  Radiation Oncology         [phone] ________________________________  Name: [name] MRN: [id number]  Date: 05/09/2017  DOB: [birth year]  Optical Surface Tracking Plan:  Since intensity modulated radiotherapy (IMRT) and 3D conformal radiation treatment methods are predicated on accurate and precise positioning for treatment, intrafraction motion monitoring is medically necessary to ensure accurate and safe treatment delivery.  The ability to quantify intrafraction motion without excessive ionizing radiation dose can only be performed with optical surface tracking. Accordingly, surface imaging offers the opportunity to obtain 3D measurements of patient position throughout IMRT and 3D treatments without excessive radiation exposure.  I am ordering optical surface tracking for this patient's upcoming course of radiotherapy. ________________________________  Kyung Rudd, MD 05/23/2017 9:02 AM    Reference:   Ursula Alert, J, et al. Surface imaging-based analysis of intrafraction motion for breast radiotherapy patients.Journal of [place], n. 6, nov. 2014. ISSN [id number].   Available at: <http://www.jacmp.org/index.php/jacmp/article/view/4957>.

## 2017-05-23 NOTE — Progress Notes (Signed)
  Radiation Oncology         [phone] ________________________________  Name: [name] MRN: [id number]  Date: 05/09/2017  DOB: 25-Jun-1945  DIAGNOSIS:     ICD-10-CM   1. Malignant neoplasm of upper-outer quadrant of right breast in female, estrogen receptor positive ([place]) C50.411    Z17.0      SIMULATION AND TREATMENT PLANNING NOTE  The patient presented for simulation prior to beginning her course of radiation treatment for her diagnosis of right-sided breast cancer. The patient was placed in a supine position on a breast board. A customized vac-lock bag was also constructed and this complex treatment device will be used on a daily basis during her treatment. In this fashion, a CT scan was obtained through the chest area and an isocenter was placed near the chest wall at the upper aspect of the right chest.  The patient will be planned to receive a course of radiation initially to a dose of 50.4 gray. This will consist of a 4 field technique targeting the right chest wall as well as the supraclavicular region. Therefore 2 customized medial and lateral tangent fields have been created targeting the right breast, and also 2 additional customized fields have been designed to treat the supraclavicular region both with a right supraclavicular field and a right posterior axillary boost field. A forward planning/reduced field technique will also be evaluated to determine if this significantly improves the dose homogeneity of the overall plan. Therefore, additional customized blocks/fields may be necessary.  This initial treatment will be accomplished at 1.8 gray per fraction.   The initial plan will consist of a 3-D conformal technique. The target volume/scar, heart and lungs have been contoured and dose volume histograms of each of these structures will be evaluated as part of the 3-D conformal treatment planning process.   It is anticipated that the patient will then receive a 10 gray boost  to the surgical scar. This will be accomplished at 2 gray per fraction. The final anticipated total dose therefore will correspond to 60.4 gray.    _______________________________   Jodelle Gross, MD, PhD

## 2017-05-24 ENCOUNTER — Telehealth: Payer: Self-pay | Admitting: Oncology

## 2017-05-24 ENCOUNTER — Ambulatory Visit
Admission: RE | Admit: 2017-05-24 | Discharge: 2017-05-24 | Disposition: A | Discharge status: Home / Self Care | Payer: Medicare Other | Source: Ambulatory Visit | Attending: Radiation Oncology | Admitting: Radiation Oncology

## 2017-05-24 DIAGNOSIS — C7800 Secondary malignant neoplasm of unspecified lung: Secondary | ICD-10-CM | POA: Diagnosis not present

## 2017-05-24 DIAGNOSIS — Z170 Estrogen receptor positive status [ER+]: Secondary | ICD-10-CM | POA: Diagnosis not present

## 2017-05-24 DIAGNOSIS — C50411 Malignant neoplasm of upper-outer quadrant of right female breast: Secondary | ICD-10-CM | POA: Diagnosis not present

## 2017-05-24 DIAGNOSIS — Z86718 Personal history of other venous thrombosis and embolism: Secondary | ICD-10-CM | POA: Diagnosis not present

## 2017-05-24 DIAGNOSIS — Z9221 Personal history of antineoplastic chemotherapy: Secondary | ICD-10-CM | POA: Diagnosis not present

## 2017-05-24 DIAGNOSIS — E119 Type 2 diabetes mellitus without complications: Secondary | ICD-10-CM | POA: Diagnosis not present

## 2017-05-24 NOTE — Telephone Encounter (Signed)
Rescheduled appts per 12/10 and GM's notes.

## 2017-05-25 ENCOUNTER — Ambulatory Visit
Admission: RE | Admit: 2017-05-25 | Discharge: 2017-05-25 | Disposition: A | Discharge status: Home / Self Care | Payer: Medicare Other | Source: Ambulatory Visit | Attending: Radiation Oncology | Admitting: Radiation Oncology

## 2017-05-25 DIAGNOSIS — C7800 Secondary malignant neoplasm of unspecified lung: Secondary | ICD-10-CM | POA: Diagnosis not present

## 2017-05-25 DIAGNOSIS — Z9221 Personal history of antineoplastic chemotherapy: Secondary | ICD-10-CM | POA: Diagnosis not present

## 2017-05-25 DIAGNOSIS — Z86718 Personal history of other venous thrombosis and embolism: Secondary | ICD-10-CM | POA: Diagnosis not present

## 2017-05-25 DIAGNOSIS — E119 Type 2 diabetes mellitus without complications: Secondary | ICD-10-CM | POA: Diagnosis not present

## 2017-05-25 DIAGNOSIS — C50411 Malignant neoplasm of upper-outer quadrant of right female breast: Secondary | ICD-10-CM | POA: Diagnosis not present

## 2017-05-25 DIAGNOSIS — Z170 Estrogen receptor positive status [ER+]: Secondary | ICD-10-CM | POA: Diagnosis not present

## 2017-05-26 ENCOUNTER — Ambulatory Visit
Admission: RE | Admit: 2017-05-26 | Discharge: 2017-05-26 | Disposition: A | Discharge status: Home / Self Care | Payer: Medicare Other | Source: Ambulatory Visit | Attending: Radiation Oncology | Admitting: Radiation Oncology

## 2017-05-26 DIAGNOSIS — C7800 Secondary malignant neoplasm of unspecified lung: Secondary | ICD-10-CM | POA: Diagnosis not present

## 2017-05-26 DIAGNOSIS — Z9221 Personal history of antineoplastic chemotherapy: Secondary | ICD-10-CM | POA: Diagnosis not present

## 2017-05-26 DIAGNOSIS — Z170 Estrogen receptor positive status [ER+]: Secondary | ICD-10-CM | POA: Diagnosis not present

## 2017-05-26 DIAGNOSIS — Z86718 Personal history of other venous thrombosis and embolism: Secondary | ICD-10-CM | POA: Diagnosis not present

## 2017-05-26 DIAGNOSIS — E119 Type 2 diabetes mellitus without complications: Secondary | ICD-10-CM | POA: Diagnosis not present

## 2017-05-26 DIAGNOSIS — C50411 Malignant neoplasm of upper-outer quadrant of right female breast: Secondary | ICD-10-CM | POA: Diagnosis not present

## 2017-05-29 ENCOUNTER — Other Ambulatory Visit (HOSPITAL_BASED_OUTPATIENT_CLINIC_OR_DEPARTMENT_OTHER): Payer: Medicare Other

## 2017-05-29 ENCOUNTER — Encounter: Payer: Self-pay | Admitting: Adult Health

## 2017-05-29 ENCOUNTER — Ambulatory Visit (HOSPITAL_BASED_OUTPATIENT_CLINIC_OR_DEPARTMENT_OTHER): Payer: Medicare Other | Admitting: Adult Health

## 2017-05-29 ENCOUNTER — Ambulatory Visit
Admission: RE | Admit: 2017-05-29 | Discharge: 2017-05-29 | Disposition: A | Discharge status: Home / Self Care | Payer: Medicare Other | Source: Ambulatory Visit | Attending: Radiation Oncology | Admitting: Radiation Oncology

## 2017-05-29 ENCOUNTER — Telehealth: Payer: Self-pay

## 2017-05-29 VITALS — BP 171/82 | HR 63 | Temp 97.9°F | Resp 18 | Ht 64.0 in | Wt 231.7 lb

## 2017-05-29 DIAGNOSIS — Z170 Estrogen receptor positive status [ER+]: Secondary | ICD-10-CM

## 2017-05-29 DIAGNOSIS — Z7189 Other specified counseling: Secondary | ICD-10-CM | POA: Diagnosis not present

## 2017-05-29 DIAGNOSIS — E119 Type 2 diabetes mellitus without complications: Secondary | ICD-10-CM | POA: Diagnosis not present

## 2017-05-29 DIAGNOSIS — C50411 Malignant neoplasm of upper-outer quadrant of right female breast: Secondary | ICD-10-CM | POA: Diagnosis not present

## 2017-05-29 DIAGNOSIS — Z86718 Personal history of other venous thrombosis and embolism: Secondary | ICD-10-CM | POA: Diagnosis not present

## 2017-05-29 DIAGNOSIS — Z9221 Personal history of antineoplastic chemotherapy: Secondary | ICD-10-CM | POA: Diagnosis not present

## 2017-05-29 DIAGNOSIS — C7801 Secondary malignant neoplasm of right lung: Secondary | ICD-10-CM | POA: Diagnosis not present

## 2017-05-29 DIAGNOSIS — Z8542 Personal history of malignant neoplasm of other parts of uterus: Secondary | ICD-10-CM

## 2017-05-29 DIAGNOSIS — K760 Fatty (change of) liver, not elsewhere classified: Secondary | ICD-10-CM

## 2017-05-29 DIAGNOSIS — C7800 Secondary malignant neoplasm of unspecified lung: Secondary | ICD-10-CM | POA: Diagnosis not present

## 2017-05-29 LAB — CBC WITH DIFFERENTIAL/PLATELET
BASO%: 0.9 % (ref 0.0–2.0)
Basophils Absolute: 0 10*3/uL (ref 0.0–0.1)
EOS%: 0.8 % (ref 0.0–7.0)
Eosinophils Absolute: 0 10*3/uL (ref 0.0–0.5)
HCT: 36 % (ref 34.8–46.6)
HGB: 12.1 g/dL (ref 11.6–15.9)
LYMPH%: 44 % (ref 14.0–49.7)
MCH: 30.9 pg (ref 25.1–34.0)
MCHC: 33.8 g/dL (ref 31.5–36.0)
MCV: 91.6 fL (ref 79.5–101.0)
MONO#: 0.3 10*3/uL (ref 0.1–0.9)
MONO%: 12.4 % (ref 0.0–14.0)
NEUT#: 1 10*3/uL — ABNORMAL LOW (ref 1.5–6.5)
NEUT%: 41.9 % (ref 38.4–76.8)
Platelets: 156 10*3/uL (ref 145–400)
RBC: 3.93 10*6/uL (ref 3.70–5.45)
RDW: 18.2 % — ABNORMAL HIGH (ref 11.2–14.5)
WBC: 2.4 10*3/uL — ABNORMAL LOW (ref 3.9–10.3)
lymph#: 1 10*3/uL (ref 0.9–3.3)

## 2017-05-29 LAB — COMPREHENSIVE METABOLIC PANEL
ALT: 14 U/L (ref 0–55)
AST: 13 U/L (ref 5–34)
Albumin: 4.1 g/dL (ref 3.5–5.0)
Alkaline Phosphatase: 66 U/L (ref 40–150)
Anion Gap: 8 mEq/L (ref 3–11)
BUN: 11.5 mg/dL (ref 7.0–26.0)
CO2: 25 mEq/L (ref 22–29)
Calcium: 9.3 mg/dL (ref 8.4–10.4)
Chloride: 108 mEq/L (ref 98–109)
Creatinine: 0.8 mg/dL (ref 0.6–1.1)
EGFR: >60 mL/min/{1.73_m2} (ref >60)
Glucose: 154 mg/dl — ABNORMAL HIGH (ref 70–140)
Potassium: 4.4 mEq/L (ref 3.5–5.1)
Sodium: 140 mEq/L (ref 136–145)
Total Bilirubin: 0.5 mg/dL (ref 0.20–1.20)
Total Protein: 7 g/dL (ref 6.4–8.3)

## 2017-05-29 IMAGING — CR DG CHEST 2V
2 series · 2 of 2 positions shown · non-contrast
Comparison: CT 03/10/2016.  Chest x-ray 04/29/2015

CLINICAL DATA: Right side lung cancer.  Cough.

EXAM:
CHEST  2 VIEW

[w chest pa]
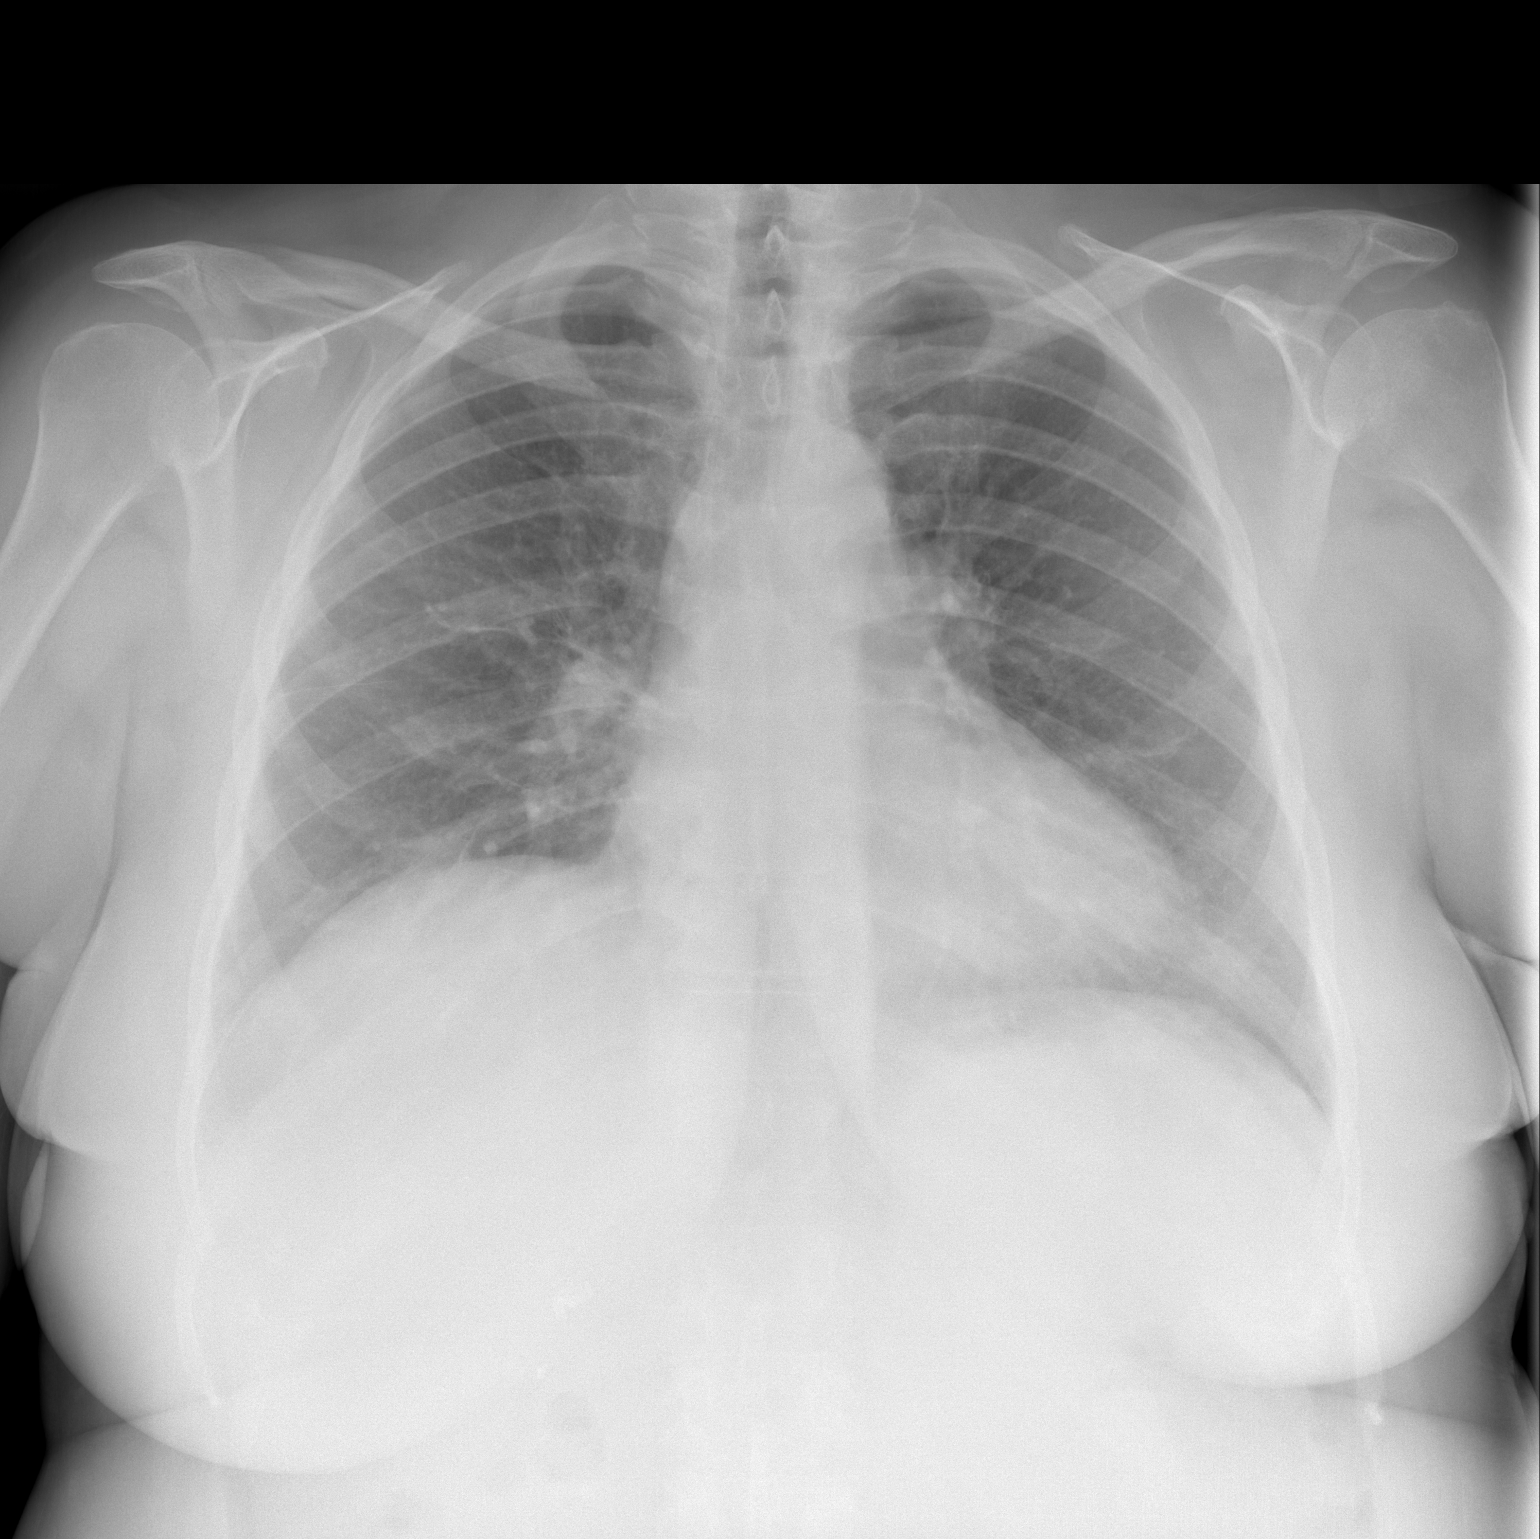

[w chest lat]
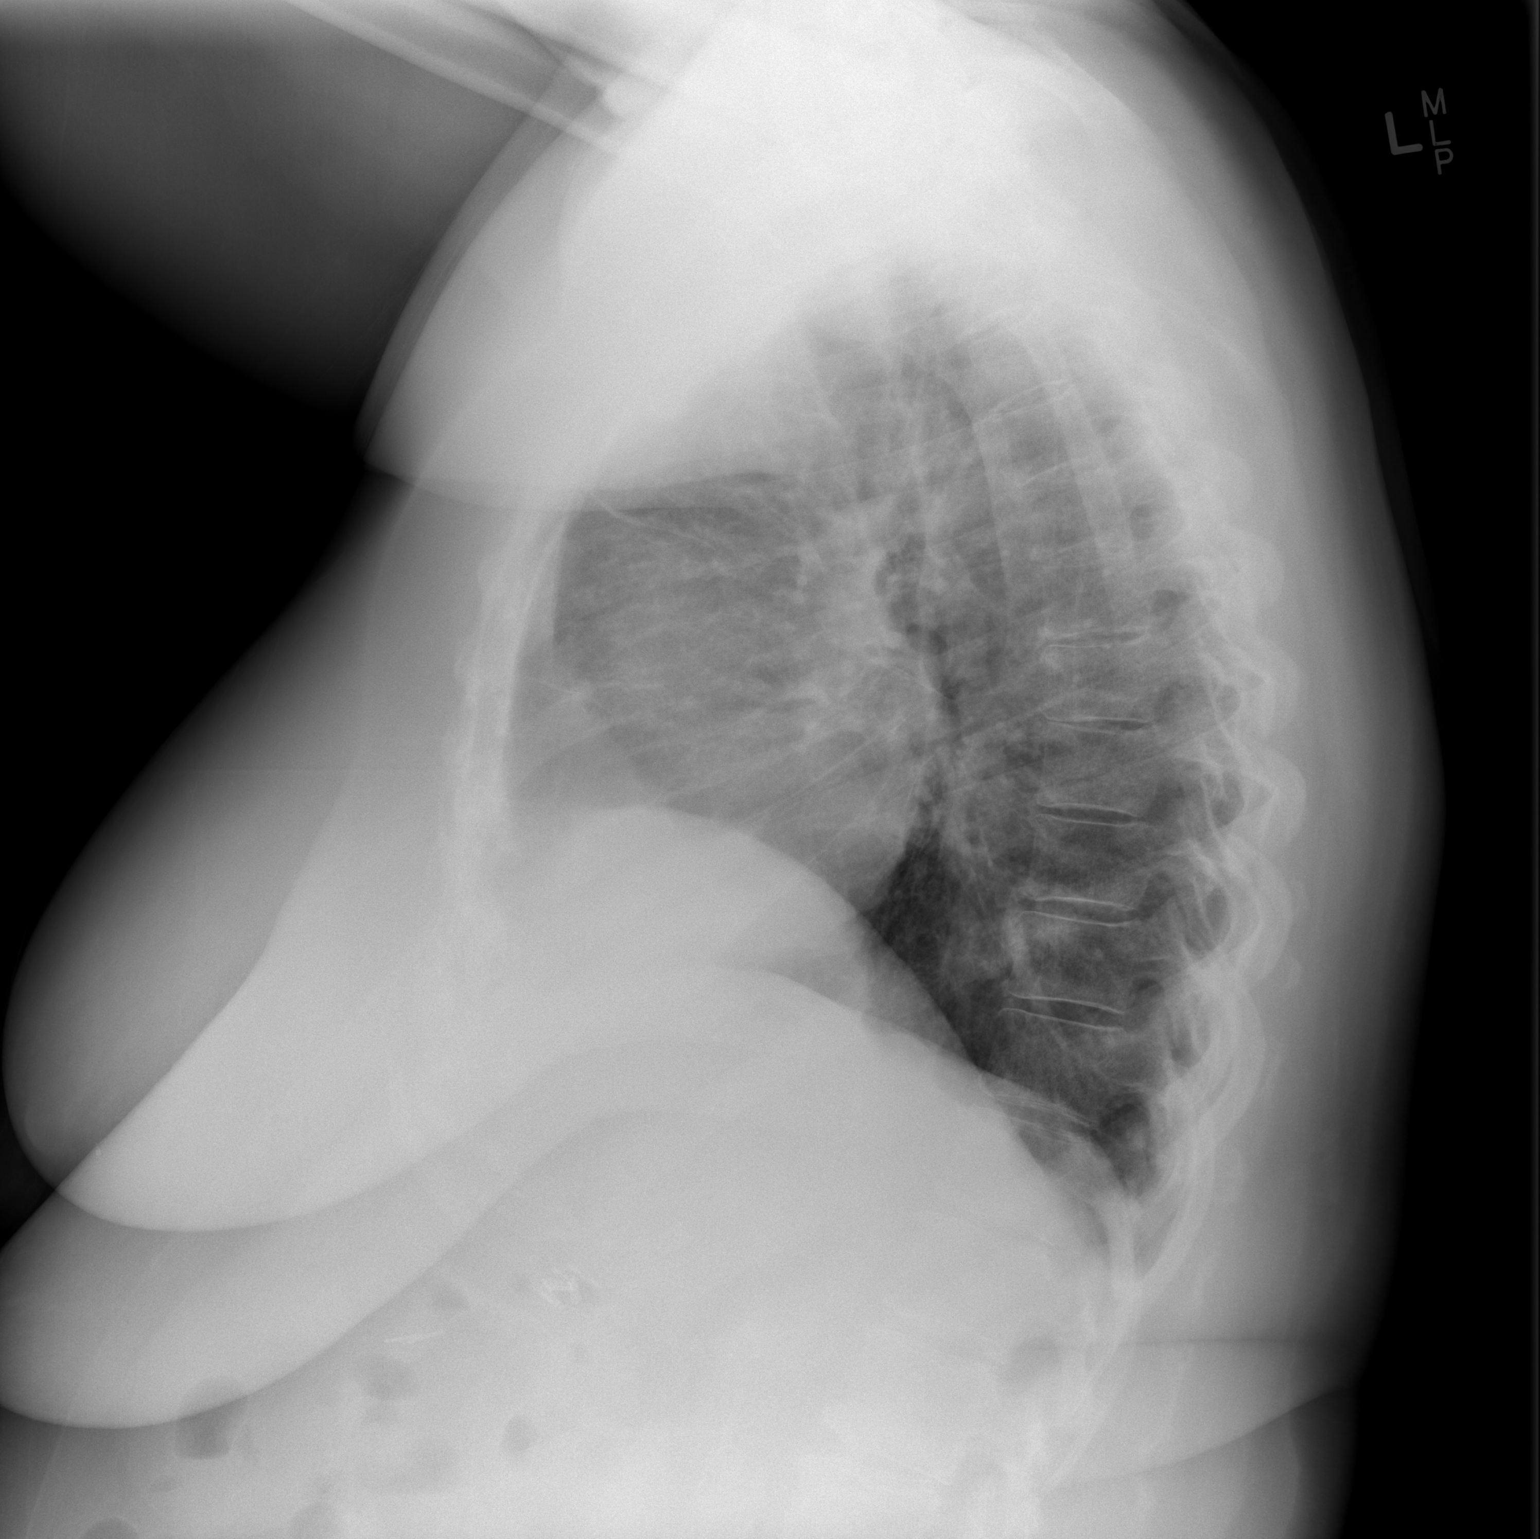

[2 of 2 positions shown; findings below may reference images not displayed]

FINDINGS: Previously seen nodular densities in both lower lungs by CT not
appreciable by plain film. No confluent airspace opacity or
effusion. Heart is normal size.
IMPRESSION: No acute findings. Previously seen nodular areas in both lower lungs
by CT not appreciable by plain film.

## 2017-05-29 MED ORDER — LETROZOLE 2.5 MG PO TABS: 2.5000 mg | ORAL_TABLET | Freq: Every day | ORAL | 3 refills | Status: DC

## 2017-05-29 NOTE — Progress Notes (Signed)
[place]  [phone] [phone]     ID: [name] DOB: 16-Jan-1946  MR#: [id number]  BPZ#:[id number]  Patient Care Team: Biagio Borg, MD as PCP - Stevan Born, MD as Consulting Physician (General Surgery) Magrinat, Virgie Dad, MD as Consulting Physician (Oncology) Kyung Rudd, MD as Consulting Physician (Radiation Oncology) Bobbye Charleston, MD as Consulting Physician (Obstetrics and Gynecology) Nada Libman, MD as Referring Physician (Specialist) Vevelyn Royals, MD as Consulting Physician (Ophthalmology) Lamonte Sakai Rose Fillers, MD as Consulting Physician (Pulmonary Disease) OTHER MD:  CHIEF COMPLAINT: Estrogen receptor positive breast cancer  CURRENT TREATMENT: Letrozole, palbociclib, adjuvant radiation   BREAST CANCER HISTORY: From the original intake note:  [name] had screening mammography showing some suspicious calcifications in the right breast leading to right diagnostic mammography with ultrasonography 01/22/2016 at [place]. The breast density was category C. In the upper right breast there was a 2.3 cm mass with additional masses measuring 0.9 and 0.7 cm. There was also a possible additional 0.8 mass in the lower inner quadrant. Ultrasound confirmed an irregular hypoechoic mass in the right breast upper outer quadrant measuring 2.0 cm. There were other masses measuring 0.7 and 0.8 cm by ultrasonography. The right axilla was sonographically benign.  Biopsy of a 12:00 and 4:00 mass in the right breast 01/22/2016 showed ([id number]) both specimens showing invasive ductal carcinoma, grade 1 or 2, both 95% estrogen receptor positive, both 95% progesterone receptor positive, both with strong staining intensity, with MIB-1 ranging from 10-15%, and both HER-2 negative, the signals ratio being 1.23-1.42, and the number per cell 1.85-2.59.  Her subsequent history is as detailed below  INTERVAL HISTORY: [name] returns today for  follow-up and treatment of her estrogen receptor positive stage IV breast cancer.  She continues on letrozole, with good tolerance.  She also receives palbociclib, at 75 mg daily, 21 days on, 7 days off.  She had to receive two weeks off for her last treatment, and her counts recovered.  She tolerated treatment well.    She also started on adjuvant radiation.  She is doing well with this.     REVIEW OF SYSTEMS: Since starting radiation she has noted fatigue.  She has noted that she is napping more, about 2-4 hours per day.  She is sleeping well at night for the most part, with the exception of last night.  She has occasional leg cramps.  [name] has occasional shortness of breath that is very occasional, and she considers it minor and related to deconditioning.  She denies nausea/vomiting.    PAST MEDICAL HISTORY: Past Medical History:  Diagnosis Date  . Breast cancer ([place])   . Cancer ([place]) 02/2016   right breast  . DIABETES MELLITUS, TYPE II 01/04/2007   only takes actoplus daily  . Dizziness and giddiness 02/29/2008  . DVT, HX OF    at age 60 in right buttocks  . Dyspnea    due to lung cancer  . Family history of breast cancer   . GERD 01/04/2007   pt reports resolved   . GLAUCOMA 07/30/2008   both eyes  . History of blood transfusion    no abnormal  reaction  . History of uterine cancer 2000   hysterectomy done  . HYPERLIPIDEMIA 01/04/2007   taking Pravastatin daily  . HYPERTENSION 01/04/2007   takes Lisinopril daily  . Joint pain   . Joint swelling   . Leg cramps   . LEG PAIN, LEFT 07/06/2007  . NUMBNESS  07/30/2008   in fingers;pt states from Diamox  . OSTEOARTHRITIS, HIP 09/25/2009  . OTITIS MEDIA, ACUTE, BILATERAL 02/29/2008  . Overweight(278.02) 01/04/2007  . Peripheral vascular disease ([place])   . Pneumonia   . PONV (postoperative nausea and vomiting)   . SLEEP APNEA, OBSTRUCTIVE    doesn't use a cpap;study done about 64yr ago  . TRANSIENT ISCHEMIC ATTACK, HX OF  01/04/2007  . Vision loss    left eye    PAST SURGICAL HISTORY: Past Surgical History:  Procedure Laterality Date  . ABDOMINAL HYSTERECTOMY  2000  . BREAST LUMPECTOMY WITH RADIOACTIVE SEED AND SENTINEL LYMPH NODE BIOPSY Right 01/13/2017   Procedure: RIGHT BREAST RADIOACTIVE SEED X'S 2 GUIDED LUMPECTOMY WITH RADIOACTIVE SEED TARGETED AXILLARYLYMPH NODE EXCISION AND RIGHT AXILLARY SENTINEL LYMPH NODE BIOPSY;  Surgeon: NAlphonsa Overall MD;  [facility]  Service: General;  Laterality: Right;  2 SEEDS IN RIGHT BREAST 1 SEED IN RIGHT AXILLARY NODE  . CHOLECYSTECTOMY    . ENDOBRONCHIAL ULTRASOUND Bilateral 03/28/2016   Procedure: ENDOBRONCHIAL ULTRASOUND;  Surgeon: RCollene Gobble MD;  Location: [facility];  Service: Cardiopulmonary;  Laterality: Bilateral;  . EYE SURGERY  13   shunt left and lazer eye surgery on right cataract and retenia tear with repair  . growth removal  2004   from thumb  . KNEE ARTHROSCOPY Right   . mulitple eye surgeries     both eyes, cataracts with ioc done both eyes  . OOPHORECTOMY    . right lumpectomy with axillary node dissection Right 01/2017  . TOTAL HIP ARTHROPLASTY  06/24/2011   Procedure: TOTAL HIP ARTHROPLASTY;  Surgeon: FKerin Salen  [facility]  Service: Orthopedics;  Laterality: Right;  . TOTAL HIP ARTHROPLASTY Left 11/12/2012   Dr RMayer Camel . TOTAL HIP ARTHROPLASTY Left 11/12/2012   Procedure: TOTAL HIP ARTHROPLASTY;  Surgeon: FKerin Salen MD;  [facility]  Service: Orthopedics;  Laterality: Left;  DEPUY PINNACLE    FAMILY HISTORY Family History  Problem Relation Age of Onset  . Dementia Mother   . Cancer Mother        Breast and lung cancer  . Stroke Sister   . Breast cancer Sister 547 . Heart attack Maternal Aunt   . Lung cancer Maternal Grandmother        non smoker  . Glaucoma Maternal Grandfather   . Anesthesia problems Neg Hx   The patient's father died at age 71 the patient's mother died at age 71 She had breast and lung  cancers diagnosed shortly before her death. The patient had no brothers, 2 sisters. One sister was diagnosed with breast cancer at the age of 53  GYNECOLOGIC HISTORY:  No LMP recorded. Patient has had a hysterectomy. Menarche age 71 first live birth age 71 the patient is GX P1. She had a hysterectomy for endometrial cancer in the year 2000. She did not take hormone replacement. She did use oral contraceptives for your than 20 years remotely, with no complications.  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 She is home with her husband [name] He is a retired mDealerTheir son CJuanda [place]also lives in [place]    ADVANCED DIRECTIVES: In place  HEALTH MAINTENANCE: Social History   Tobacco Use  . Smoking status: Never Smoker  . Smokeless tobacco: Never Used  Substance Use Topics  . Alcohol use: No  . Drug use: No  Colonoscopy: Never  PAP: Status post hysterectomy  Bone density: Remote   Allergies  Allergen Reactions  . Codeine Hives    Hycodan syrup  . Fluorescein Nausea And Vomiting    ? IV dye for retina specialist  . Lipitor [Atorvastatin Calcium]     Leg cramp  . Oxycodone Nausea And Vomiting    Patient vomited for 3 days after taking  . Sitagliptin Phosphate Nausea And Vomiting  . Sulfa Drugs Cross Reactors Nausea And Vomiting    Current Outpatient Medications  Medication Sig Dispense Refill  . acetaminophen (TYLENOL) 500 MG tablet Take 1,000 mg by mouth every 4 (four) hours as needed for moderate pain or fever.    Marland Kitchen aspirin EC 81 MG tablet Take 81 mg by mouth daily at 6 PM. 1700    . bimatoprost (LUMIGAN) 0.01 % SOLN Place 1 drop into both eyes at bedtime.    . brimonidine-timolol (COMBIGAN) 0.2-0.5 % ophthalmic solution Place 1 drop into both eyes three times daily    . cholecalciferol (VITAMIN D) 1000 units tablet Take 1,000 Units by mouth daily.    . dorzolamide-timolol  (COSOPT) 22.3-6.8 MG/ML ophthalmic solution Place 1 drop into both eyes 2 (two) times daily.    . hyaluronate sodium (RADIAPLEXRX) GEL Apply 1 application topically 2 (two) times daily.    [name] 75 MG capsule TAKE 1 CAPSULE (75 MG TOTAL) BY MOUTH DAILY WITH BREAKFAST. TAKE WHOLE WITH FOOD. 21 capsule 0  . letrozole (FEMARA) 2.5 MG tablet Take 1 tablet (2.5 mg total) by mouth at bedtime. 90 tablet 3  . lisinopril (PRINIVIL,ZESTRIL) 20 MG tablet Take 1 tablet (20 mg total) by mouth daily. (Patient taking differently: Take 20 mg by mouth daily at 6 PM. 1700) 90 tablet 3  . lovastatin (MEVACOR) 20 MG tablet Take 1 tablet (20 mg total) by mouth every evening. (Patient taking differently: Take 20 mg by mouth daily at 6 PM. 1700) 90 tablet 3  . non-metallic deodorant (ALRA) MISC Apply 1 application topically daily as needed.    . pioglitazone-metformin (ACTOPLUS MET) 15-500 MG tablet Take 15-500 tablets by mouth daily. (Patient taking differently: Take 1 tablet by mouth daily at 6 PM. 1700) 90 tablet 3  . prochlorperazine (COMPAZINE) 10 MG tablet Take 1 tablet (10 mg total) by mouth every 6 (six) hours as needed for nausea or vomiting. (Patient not taking: Reported on 04/25/2017) 30 tablet 0   No current facility-administered medications for this visit.      OBJECTIVE:  Vitals:   05/29/17 1327  BP: (!) 171/82  Pulse: 63  Resp: 18  Temp: 97.9 F (36.6 C)  SpO2: 100%     Body mass index is 39.77 kg/m.    ECOG FS:1 - Symptomatic but completely ambulatory Filed Weights   05/29/17 1327  Weight: 231 lb 11.2 oz (105.1 kg)   GENERAL: Patient is a well appearing female in no acute distress HEENT:  Sclerae anicteric.  Oropharynx clear and moist. No ulcerations or evidence of oropharyngeal candidiasis. Neck is supple.  NODES:  No cervical, supraclavicular, or axillary lymphadenopathy palpated.  BREAST EXAM:  Deferred. LUNGS:  Clear to auscultation bilaterally.  No wheezes or rhonchi. HEART:   Regular rate and rhythm. No murmur appreciated. ABDOMEN:  Soft, nontender.  Positive, normoactive bowel sounds. No organomegaly palpated. MSK:  No focal spinal tenderness to palpation. Full range of motion bilaterally in the upper extremities. EXTREMITIES:  No peripheral edema.   SKIN:  Clear with no obvious  rashes or skin changes. No nail dyscrasia. NEURO:  Nonfocal. Well oriented.  Appropriate affect.     LAB RESULTS:  CMP     Component Value Date/Time   NA 140 05/29/2017 1254   K 4.4 05/29/2017 1254   CL 107 07/23/2016 0550   CO2 25 05/29/2017 1254   GLUCOSE 154 (H) 05/29/2017 1254   BUN 11.5 05/29/2017 1254   CREATININE 0.8 05/29/2017 1254   CALCIUM 9.3 05/29/2017 1254   PROT 7.0 05/29/2017 1254   ALBUMIN 4.1 05/29/2017 1254   AST 13 05/29/2017 1254   ALT 14 05/29/2017 1254   ALKPHOS 66 05/29/2017 1254   BILITOT 0.50 05/29/2017 1254   GFRNONAA >60 07/23/2016 0550   GFRAA >60 07/23/2016 0550    INo results found for: SPEP, UPEP  Lab Results  Component Value Date   WBC 2.4 (L) 05/29/2017   NEUTROABS 1.0 (L) 05/29/2017   HGB 12.1 05/29/2017   HCT 36.0 05/29/2017   MCV 91.6 05/29/2017   PLT 156 05/29/2017      Chemistry      Component Value Date/Time   NA 140 05/29/2017 1254   K 4.4 05/29/2017 1254   CL 107 07/23/2016 0550   CO2 25 05/29/2017 1254   BUN 11.5 05/29/2017 1254   CREATININE 0.8 05/29/2017 1254      Component Value Date/Time   CALCIUM 9.3 05/29/2017 1254   ALKPHOS 66 05/29/2017 1254   AST 13 05/29/2017 1254   ALT 14 05/29/2017 1254   BILITOT 0.50 05/29/2017 1254       No results found for: LABCA2  No components found for: LABCA125  No results for input(s): INR in the last 168 hours.  Urinalysis    Component Value Date/Time   COLORURINE YELLOW 07/22/2016 1132   APPEARANCEUR HAZY (A) 07/22/2016 1132   LABSPEC 1.013 07/22/2016 1132   PHURINE 5.0 07/22/2016 1132   GLUCOSEU NEGATIVE 07/22/2016 1132   GLUCOSEU NEGATIVE 10/30/2014  1513   HGBUR MODERATE (A) 07/22/2016 1132   BILIRUBINUR NEGATIVE 07/22/2016 1132   KETONESUR 5 (A) 07/22/2016 1132   PROTEINUR NEGATIVE 07/22/2016 1132   UROBILINOGEN 0.2 10/30/2014 1513   NITRITE NEGATIVE 07/22/2016 1132   LEUKOCYTESUR LARGE (A) 07/22/2016 1132     STUDIES: No results found.   ELIGIBLE FOR AVAILABLE RESEARCH PROTOCOL: Multiple, as noted below  ASSESSMENT: 71 y.o. [place] woman with a remote history of early stage endometrial cancer, now status post right breast upper outer quadrant biopsy 01/22/2016 for a clinically multifocal T2 N0, stage 2A invasive ductal carcinoma, grade 1, estrogen and progesterone receptor positive, HER-2 negative, with an MIB-1 between 10 and 15%.  (1) right axillary lymph node biopsy 03/02/2016 positive  (2) genetics testing 01/13/2016 through the Custom gene panel offered by [facility] found no deleterious mutations in  ATM, BARD1, BRCA1, BRCA2, BRIP1, CDH1, CHEK2, EPCAM, FANCC, MLH1, MSH2, MSH6, MUTYH, NBN, PALB2, PMS2, POLD1, PTEN, RAD51C, RAD51D, TP53, and XRCC2  METASTATIC DISEASE: OCT 2017 (3) CT scans of the chest abdomen and pelvis obtained 03/10/2016 are consistent with bilateral lung metastases and mediastinal and hilar nodal involvement, but no liver or bone spread  (a) bronchoscopic lymph node biopsy 2 (station 7, 13R) 03/28/2016 confirms metastatic adenocarcinoma, estrogen receptor positive, HER-2 not amplified  (b) baseline CA-27-29 on 04/18/2016 was 137.5.  (4) letrozole started 03/15/2016, palbociclib added 03/29/2016 at 125 mg/day, 21/7  (a) dose decreased to 100 mg per day, 21/7, beginning with February cycle  (b) palbociclib held 01/31/2017, with increasing  symptoms  (c) palbociclib resumed October 2018 at 75 mg daily  (5) status post double right lumpectomies and right axillary lymph node sampling 01/13/2017 for 2 separate invasive ductal carcinoma lesions, pT1a and pT1b, N1a, with negative margins, both lesions  being estrogen and progesterone receptor positive and HER-2 negative  (6) adjuvant radiation ending 07/05/2017   PLAN: Jhordyn is doing well today.  Her WBC continue to be slightly low.  Her ANC is 1.  Due to her continued issues with neutropenia, we will hold her Palbociclib x 1 week and then she will restart the Palbociclib at '75mg'$  every other day x 11 doses (22 days).  She hasn't undergone scans in a few months, so I ordered a bone scan and a CT chest to be done hopefully before the end of the year.  She verbalized understanding of this.  She will return on 1/17 to see Dr. Jana Hakim which will be just prior to when she will resume the Palbociclib.    She knows to call for any questions or concerns prior to her next appt.    A total of (30) minutes of face-to-face time was spent with this patient with greater than 50% of that time in counseling and care-coordination.    Wilber Bihari, NP  05/29/17 4:11 PM Medical Oncology and Hematology [facility] [address] [place], [place] [address] Tel. [phone]    Fax. [phone]

## 2017-05-29 NOTE — Telephone Encounter (Signed)
appts made per 12/17 inbasket and patient had he avs printed, she is aware that she will get a call with her scan appts [name]

## 2017-05-29 NOTE — Patient Instructions (Signed)
Start [place] on December 25.  Take Ibrance every other day for 22 days.  We will see you back on 1/18 for follow up.  We ordered a CT chest and bone scan.  Please call us if this isn't scheduled in the next two days.

## 2017-05-30 ENCOUNTER — Other Ambulatory Visit: Payer: Self-pay | Admitting: Internal Medicine

## 2017-05-30 ENCOUNTER — Ambulatory Visit
Admission: RE | Admit: 2017-05-30 | Discharge: 2017-05-30 | Disposition: A | Discharge status: Home / Self Care | Payer: Medicare Other | Source: Ambulatory Visit | Attending: Radiation Oncology | Admitting: Radiation Oncology

## 2017-05-30 DIAGNOSIS — E119 Type 2 diabetes mellitus without complications: Secondary | ICD-10-CM | POA: Diagnosis not present

## 2017-05-30 DIAGNOSIS — Z9221 Personal history of antineoplastic chemotherapy: Secondary | ICD-10-CM | POA: Diagnosis not present

## 2017-05-30 DIAGNOSIS — Z170 Estrogen receptor positive status [ER+]: Secondary | ICD-10-CM | POA: Diagnosis not present

## 2017-05-30 DIAGNOSIS — C7800 Secondary malignant neoplasm of unspecified lung: Secondary | ICD-10-CM | POA: Diagnosis not present

## 2017-05-30 DIAGNOSIS — Z86718 Personal history of other venous thrombosis and embolism: Secondary | ICD-10-CM | POA: Diagnosis not present

## 2017-05-30 DIAGNOSIS — C50411 Malignant neoplasm of upper-outer quadrant of right female breast: Secondary | ICD-10-CM | POA: Diagnosis not present

## 2017-05-30 LAB — CANCER ANTIGEN 27.29: CA 27.29: 23.4 U/mL (ref 0.0–38.6)

## 2017-05-31 ENCOUNTER — Other Ambulatory Visit: Payer: Self-pay | Admitting: Adult Health

## 2017-05-31 ENCOUNTER — Ambulatory Visit
Admission: RE | Admit: 2017-05-31 | Discharge: 2017-05-31 | Disposition: A | Discharge status: Home / Self Care | Payer: Medicare Other | Source: Ambulatory Visit | Attending: Radiation Oncology | Admitting: Radiation Oncology

## 2017-05-31 DIAGNOSIS — Z170 Estrogen receptor positive status [ER+]: Secondary | ICD-10-CM

## 2017-05-31 DIAGNOSIS — R937 Abnormal findings on diagnostic imaging of other parts of musculoskeletal system: Secondary | ICD-10-CM

## 2017-05-31 DIAGNOSIS — E119 Type 2 diabetes mellitus without complications: Secondary | ICD-10-CM | POA: Diagnosis not present

## 2017-05-31 DIAGNOSIS — C50411 Malignant neoplasm of upper-outer quadrant of right female breast: Secondary | ICD-10-CM

## 2017-05-31 DIAGNOSIS — C7800 Secondary malignant neoplasm of unspecified lung: Secondary | ICD-10-CM | POA: Diagnosis not present

## 2017-05-31 DIAGNOSIS — Z86718 Personal history of other venous thrombosis and embolism: Secondary | ICD-10-CM | POA: Diagnosis not present

## 2017-05-31 DIAGNOSIS — Z9221 Personal history of antineoplastic chemotherapy: Secondary | ICD-10-CM | POA: Diagnosis not present

## 2017-05-31 DIAGNOSIS — M1612 Unilateral primary osteoarthritis, left hip: Secondary | ICD-10-CM

## 2017-06-01 ENCOUNTER — Ambulatory Visit
Admission: RE | Admit: 2017-06-01 | Discharge: 2017-06-01 | Disposition: A | Discharge status: Home / Self Care | Payer: Medicare Other | Source: Ambulatory Visit | Attending: Radiation Oncology | Admitting: Radiation Oncology

## 2017-06-01 DIAGNOSIS — Z170 Estrogen receptor positive status [ER+]: Secondary | ICD-10-CM | POA: Diagnosis not present

## 2017-06-01 DIAGNOSIS — C7800 Secondary malignant neoplasm of unspecified lung: Secondary | ICD-10-CM | POA: Diagnosis not present

## 2017-06-01 DIAGNOSIS — Z86718 Personal history of other venous thrombosis and embolism: Secondary | ICD-10-CM | POA: Diagnosis not present

## 2017-06-01 DIAGNOSIS — C50411 Malignant neoplasm of upper-outer quadrant of right female breast: Secondary | ICD-10-CM | POA: Diagnosis not present

## 2017-06-01 DIAGNOSIS — Z9221 Personal history of antineoplastic chemotherapy: Secondary | ICD-10-CM | POA: Diagnosis not present

## 2017-06-01 DIAGNOSIS — E119 Type 2 diabetes mellitus without complications: Secondary | ICD-10-CM | POA: Diagnosis not present

## 2017-06-02 ENCOUNTER — Ambulatory Visit
Admission: RE | Admit: 2017-06-02 | Discharge: 2017-06-02 | Disposition: A | Discharge status: Home / Self Care | Payer: Medicare Other | Source: Ambulatory Visit | Attending: Radiation Oncology | Admitting: Radiation Oncology

## 2017-06-02 DIAGNOSIS — E119 Type 2 diabetes mellitus without complications: Secondary | ICD-10-CM | POA: Diagnosis not present

## 2017-06-02 DIAGNOSIS — C50411 Malignant neoplasm of upper-outer quadrant of right female breast: Secondary | ICD-10-CM | POA: Diagnosis not present

## 2017-06-02 DIAGNOSIS — Z170 Estrogen receptor positive status [ER+]: Secondary | ICD-10-CM | POA: Diagnosis not present

## 2017-06-02 DIAGNOSIS — Z86718 Personal history of other venous thrombosis and embolism: Secondary | ICD-10-CM | POA: Diagnosis not present

## 2017-06-02 DIAGNOSIS — Z9221 Personal history of antineoplastic chemotherapy: Secondary | ICD-10-CM | POA: Diagnosis not present

## 2017-06-02 DIAGNOSIS — C7800 Secondary malignant neoplasm of unspecified lung: Secondary | ICD-10-CM | POA: Diagnosis not present

## 2017-06-05 ENCOUNTER — Ambulatory Visit
Admission: RE | Admit: 2017-06-05 | Discharge: 2017-06-05 | Disposition: A | Discharge status: Home / Self Care | Payer: Medicare Other | Source: Ambulatory Visit | Attending: Radiation Oncology | Admitting: Radiation Oncology

## 2017-06-05 DIAGNOSIS — E119 Type 2 diabetes mellitus without complications: Secondary | ICD-10-CM | POA: Diagnosis not present

## 2017-06-05 DIAGNOSIS — Z86718 Personal history of other venous thrombosis and embolism: Secondary | ICD-10-CM | POA: Diagnosis not present

## 2017-06-05 DIAGNOSIS — Z9221 Personal history of antineoplastic chemotherapy: Secondary | ICD-10-CM | POA: Diagnosis not present

## 2017-06-05 DIAGNOSIS — Z170 Estrogen receptor positive status [ER+]: Secondary | ICD-10-CM | POA: Diagnosis not present

## 2017-06-05 DIAGNOSIS — C50411 Malignant neoplasm of upper-outer quadrant of right female breast: Secondary | ICD-10-CM | POA: Diagnosis not present

## 2017-06-05 DIAGNOSIS — C7800 Secondary malignant neoplasm of unspecified lung: Secondary | ICD-10-CM | POA: Diagnosis not present

## 2017-06-07 ENCOUNTER — Ambulatory Visit
Admission: RE | Admit: 2017-06-07 | Discharge: 2017-06-07 | Disposition: A | Discharge status: Home / Self Care | Payer: Medicare Other | Source: Ambulatory Visit | Attending: Radiation Oncology | Admitting: Radiation Oncology

## 2017-06-07 DIAGNOSIS — Z86718 Personal history of other venous thrombosis and embolism: Secondary | ICD-10-CM | POA: Diagnosis not present

## 2017-06-07 DIAGNOSIS — C50411 Malignant neoplasm of upper-outer quadrant of right female breast: Secondary | ICD-10-CM | POA: Diagnosis not present

## 2017-06-07 DIAGNOSIS — Z170 Estrogen receptor positive status [ER+]: Secondary | ICD-10-CM | POA: Diagnosis not present

## 2017-06-07 DIAGNOSIS — Z9221 Personal history of antineoplastic chemotherapy: Secondary | ICD-10-CM | POA: Diagnosis not present

## 2017-06-07 DIAGNOSIS — E119 Type 2 diabetes mellitus without complications: Secondary | ICD-10-CM | POA: Diagnosis not present

## 2017-06-07 DIAGNOSIS — C7800 Secondary malignant neoplasm of unspecified lung: Secondary | ICD-10-CM | POA: Diagnosis not present

## 2017-06-08 ENCOUNTER — Ambulatory Visit
Admission: RE | Admit: 2017-06-08 | Discharge: 2017-06-08 | Disposition: A | Discharge status: Home / Self Care | Payer: Medicare Other | Source: Ambulatory Visit | Attending: Radiation Oncology | Admitting: Radiation Oncology

## 2017-06-08 DIAGNOSIS — C50411 Malignant neoplasm of upper-outer quadrant of right female breast: Secondary | ICD-10-CM | POA: Diagnosis not present

## 2017-06-08 DIAGNOSIS — Z9221 Personal history of antineoplastic chemotherapy: Secondary | ICD-10-CM | POA: Diagnosis not present

## 2017-06-08 DIAGNOSIS — E119 Type 2 diabetes mellitus without complications: Secondary | ICD-10-CM | POA: Diagnosis not present

## 2017-06-08 DIAGNOSIS — Z86718 Personal history of other venous thrombosis and embolism: Secondary | ICD-10-CM | POA: Diagnosis not present

## 2017-06-08 DIAGNOSIS — Z170 Estrogen receptor positive status [ER+]: Secondary | ICD-10-CM | POA: Diagnosis not present

## 2017-06-08 DIAGNOSIS — C7800 Secondary malignant neoplasm of unspecified lung: Secondary | ICD-10-CM | POA: Diagnosis not present

## 2017-06-09 ENCOUNTER — Ambulatory Visit
Admission: RE | Admit: 2017-06-09 | Discharge: 2017-06-09 | Disposition: A | Discharge status: Home / Self Care | Payer: Medicare Other | Source: Ambulatory Visit | Attending: Radiation Oncology | Admitting: Radiation Oncology

## 2017-06-09 DIAGNOSIS — C50411 Malignant neoplasm of upper-outer quadrant of right female breast: Secondary | ICD-10-CM | POA: Diagnosis not present

## 2017-06-09 DIAGNOSIS — Z86718 Personal history of other venous thrombosis and embolism: Secondary | ICD-10-CM | POA: Diagnosis not present

## 2017-06-09 DIAGNOSIS — C7800 Secondary malignant neoplasm of unspecified lung: Secondary | ICD-10-CM | POA: Diagnosis not present

## 2017-06-09 DIAGNOSIS — E119 Type 2 diabetes mellitus without complications: Secondary | ICD-10-CM | POA: Diagnosis not present

## 2017-06-09 DIAGNOSIS — Z9221 Personal history of antineoplastic chemotherapy: Secondary | ICD-10-CM | POA: Diagnosis not present

## 2017-06-09 DIAGNOSIS — Z170 Estrogen receptor positive status [ER+]: Secondary | ICD-10-CM | POA: Diagnosis not present

## 2017-06-12 ENCOUNTER — Ambulatory Visit
Admission: RE | Admit: 2017-06-12 | Discharge: 2017-06-12 | Disposition: A | Discharge status: Home / Self Care | Payer: Medicare Other | Source: Ambulatory Visit | Attending: Radiation Oncology | Admitting: Radiation Oncology

## 2017-06-12 VITALS — Temp 98.1°F | Resp 20 | Wt 231.0 lb

## 2017-06-12 DIAGNOSIS — Z170 Estrogen receptor positive status [ER+]: Secondary | ICD-10-CM

## 2017-06-12 DIAGNOSIS — Z9221 Personal history of antineoplastic chemotherapy: Secondary | ICD-10-CM | POA: Diagnosis not present

## 2017-06-12 DIAGNOSIS — C50411 Malignant neoplasm of upper-outer quadrant of right female breast: Secondary | ICD-10-CM | POA: Diagnosis not present

## 2017-06-12 DIAGNOSIS — C7800 Secondary malignant neoplasm of unspecified lung: Secondary | ICD-10-CM | POA: Diagnosis not present

## 2017-06-12 DIAGNOSIS — E119 Type 2 diabetes mellitus without complications: Secondary | ICD-10-CM | POA: Diagnosis not present

## 2017-06-12 DIAGNOSIS — Z86718 Personal history of other venous thrombosis and embolism: Secondary | ICD-10-CM | POA: Diagnosis not present

## 2017-06-12 NOTE — Progress Notes (Signed)
Patient brought to nursing after rad txs right breast , had thrown up some slight emesis, c/o dizzyness not nausea , took orthostatic vitals,  Temp 98.1 F (36.7 C) (Oral)   Resp 20   Wt 231 lb (104.8 kg)   SpO2 100%   BMI 39.65 kg/m  sitting b/p=166/74. ,P=69, RR=20 Standing B/P=152/94,P=74, offered ginger ale, patient has emesis bag, asked if she felt she needed to see our PA?,nnuasea has resolved, patient stated no, I'm fine, she will take her compazine whrn she gets home and eat a bland diet rest of the day,  2:06 PM

## 2017-06-14 ENCOUNTER — Ambulatory Visit
Admission: RE | Admit: 2017-06-14 | Discharge: 2017-06-14 | Disposition: A | Discharge status: Home / Self Care | Payer: Medicare Other | Source: Ambulatory Visit | Attending: Radiation Oncology | Admitting: Radiation Oncology

## 2017-06-14 DIAGNOSIS — Z9221 Personal history of antineoplastic chemotherapy: Secondary | ICD-10-CM | POA: Diagnosis not present

## 2017-06-14 DIAGNOSIS — C50411 Malignant neoplasm of upper-outer quadrant of right female breast: Secondary | ICD-10-CM | POA: Diagnosis not present

## 2017-06-14 DIAGNOSIS — Z823 Family history of stroke: Secondary | ICD-10-CM | POA: Diagnosis not present

## 2017-06-14 DIAGNOSIS — E119 Type 2 diabetes mellitus without complications: Secondary | ICD-10-CM | POA: Diagnosis not present

## 2017-06-14 DIAGNOSIS — G4733 Obstructive sleep apnea (adult) (pediatric): Secondary | ICD-10-CM | POA: Diagnosis not present

## 2017-06-14 DIAGNOSIS — Z170 Estrogen receptor positive status [ER+]: Secondary | ICD-10-CM | POA: Diagnosis not present

## 2017-06-14 DIAGNOSIS — Z9049 Acquired absence of other specified parts of digestive tract: Secondary | ICD-10-CM | POA: Diagnosis not present

## 2017-06-14 DIAGNOSIS — M1610 Unilateral primary osteoarthritis, unspecified hip: Secondary | ICD-10-CM | POA: Diagnosis not present

## 2017-06-14 DIAGNOSIS — Z820 Family history of epilepsy and other diseases of the nervous system: Secondary | ICD-10-CM | POA: Diagnosis not present

## 2017-06-14 DIAGNOSIS — Z8249 Family history of ischemic heart disease and other diseases of the circulatory system: Secondary | ICD-10-CM | POA: Diagnosis not present

## 2017-06-14 DIAGNOSIS — Z803 Family history of malignant neoplasm of breast: Secondary | ICD-10-CM | POA: Diagnosis not present

## 2017-06-14 DIAGNOSIS — Z86718 Personal history of other venous thrombosis and embolism: Secondary | ICD-10-CM | POA: Diagnosis not present

## 2017-06-14 DIAGNOSIS — Z79899 Other long term (current) drug therapy: Secondary | ICD-10-CM | POA: Diagnosis not present

## 2017-06-14 DIAGNOSIS — Z885 Allergy status to narcotic agent status: Secondary | ICD-10-CM | POA: Diagnosis not present

## 2017-06-14 DIAGNOSIS — Z90710 Acquired absence of both cervix and uterus: Secondary | ICD-10-CM | POA: Diagnosis not present

## 2017-06-14 DIAGNOSIS — Z7982 Long term (current) use of aspirin: Secondary | ICD-10-CM | POA: Diagnosis not present

## 2017-06-14 DIAGNOSIS — Z888 Allergy status to other drugs, medicaments and biological substances status: Secondary | ICD-10-CM | POA: Diagnosis not present

## 2017-06-14 DIAGNOSIS — Z96643 Presence of artificial hip joint, bilateral: Secondary | ICD-10-CM | POA: Diagnosis not present

## 2017-06-14 DIAGNOSIS — Z801 Family history of malignant neoplasm of trachea, bronchus and lung: Secondary | ICD-10-CM | POA: Diagnosis not present

## 2017-06-14 DIAGNOSIS — I10 Essential (primary) hypertension: Secondary | ICD-10-CM | POA: Diagnosis not present

## 2017-06-14 DIAGNOSIS — C7800 Secondary malignant neoplasm of unspecified lung: Secondary | ICD-10-CM | POA: Diagnosis not present

## 2017-06-14 DIAGNOSIS — Z882 Allergy status to sulfonamides status: Secondary | ICD-10-CM | POA: Diagnosis not present

## 2017-06-14 DIAGNOSIS — Z9889 Other specified postprocedural states: Secondary | ICD-10-CM | POA: Diagnosis not present

## 2017-06-15 ENCOUNTER — Ambulatory Visit
Admission: RE | Admit: 2017-06-15 | Discharge: 2017-06-15 | Disposition: A | Discharge status: Home / Self Care | Payer: Medicare Other | Source: Ambulatory Visit | Attending: Radiation Oncology | Admitting: Radiation Oncology

## 2017-06-15 DIAGNOSIS — Z170 Estrogen receptor positive status [ER+]: Secondary | ICD-10-CM | POA: Diagnosis not present

## 2017-06-15 DIAGNOSIS — Z9221 Personal history of antineoplastic chemotherapy: Secondary | ICD-10-CM | POA: Diagnosis not present

## 2017-06-15 DIAGNOSIS — C50411 Malignant neoplasm of upper-outer quadrant of right female breast: Secondary | ICD-10-CM | POA: Diagnosis not present

## 2017-06-15 DIAGNOSIS — E119 Type 2 diabetes mellitus without complications: Secondary | ICD-10-CM | POA: Diagnosis not present

## 2017-06-15 DIAGNOSIS — Z86718 Personal history of other venous thrombosis and embolism: Secondary | ICD-10-CM | POA: Diagnosis not present

## 2017-06-15 DIAGNOSIS — C7800 Secondary malignant neoplasm of unspecified lung: Secondary | ICD-10-CM | POA: Diagnosis not present

## 2017-06-16 ENCOUNTER — Ambulatory Visit
Admission: RE | Admit: 2017-06-16 | Discharge: 2017-06-16 | Disposition: A | Discharge status: Home / Self Care | Payer: Medicare Other | Source: Ambulatory Visit | Attending: Radiation Oncology | Admitting: Radiation Oncology

## 2017-06-16 DIAGNOSIS — Z86718 Personal history of other venous thrombosis and embolism: Secondary | ICD-10-CM | POA: Diagnosis not present

## 2017-06-16 DIAGNOSIS — E119 Type 2 diabetes mellitus without complications: Secondary | ICD-10-CM | POA: Diagnosis not present

## 2017-06-16 DIAGNOSIS — Z170 Estrogen receptor positive status [ER+]: Secondary | ICD-10-CM | POA: Diagnosis not present

## 2017-06-16 DIAGNOSIS — C50411 Malignant neoplasm of upper-outer quadrant of right female breast: Secondary | ICD-10-CM | POA: Diagnosis not present

## 2017-06-16 DIAGNOSIS — Z9221 Personal history of antineoplastic chemotherapy: Secondary | ICD-10-CM | POA: Diagnosis not present

## 2017-06-16 DIAGNOSIS — C7800 Secondary malignant neoplasm of unspecified lung: Secondary | ICD-10-CM | POA: Diagnosis not present

## 2017-06-19 ENCOUNTER — Ambulatory Visit
Admission: RE | Admit: 2017-06-19 | Discharge: 2017-06-19 | Disposition: A | Discharge status: Home / Self Care | Payer: Medicare Other | Source: Ambulatory Visit | Attending: Radiation Oncology | Admitting: Radiation Oncology

## 2017-06-19 DIAGNOSIS — C50411 Malignant neoplasm of upper-outer quadrant of right female breast: Secondary | ICD-10-CM | POA: Diagnosis not present

## 2017-06-19 DIAGNOSIS — Z170 Estrogen receptor positive status [ER+]: Secondary | ICD-10-CM | POA: Insufficient documentation

## 2017-06-19 DIAGNOSIS — Z510 Encounter for antineoplastic radiation therapy: Secondary | ICD-10-CM | POA: Insufficient documentation

## 2017-06-20 ENCOUNTER — Ambulatory Visit
Admission: RE | Admit: 2017-06-20 | Discharge: 2017-06-20 | Disposition: A | Discharge status: Home / Self Care | Payer: Medicare Other | Source: Ambulatory Visit | Attending: Radiation Oncology | Admitting: Radiation Oncology

## 2017-06-20 DIAGNOSIS — Z510 Encounter for antineoplastic radiation therapy: Secondary | ICD-10-CM | POA: Diagnosis not present

## 2017-06-20 DIAGNOSIS — Z170 Estrogen receptor positive status [ER+]: Secondary | ICD-10-CM | POA: Diagnosis not present

## 2017-06-20 DIAGNOSIS — C50411 Malignant neoplasm of upper-outer quadrant of right female breast: Secondary | ICD-10-CM | POA: Diagnosis not present

## 2017-06-21 ENCOUNTER — Other Ambulatory Visit: Payer: Self-pay | Admitting: Adult Health

## 2017-06-21 ENCOUNTER — Ambulatory Visit
Admission: RE | Admit: 2017-06-21 | Discharge: 2017-06-21 | Disposition: A | Discharge status: Home / Self Care | Payer: Medicare Other | Source: Ambulatory Visit | Attending: Radiation Oncology | Admitting: Radiation Oncology

## 2017-06-21 DIAGNOSIS — R937 Abnormal findings on diagnostic imaging of other parts of musculoskeletal system: Secondary | ICD-10-CM

## 2017-06-21 DIAGNOSIS — Z170 Estrogen receptor positive status [ER+]: Secondary | ICD-10-CM

## 2017-06-21 DIAGNOSIS — Z510 Encounter for antineoplastic radiation therapy: Secondary | ICD-10-CM | POA: Diagnosis not present

## 2017-06-21 DIAGNOSIS — C50411 Malignant neoplasm of upper-outer quadrant of right female breast: Secondary | ICD-10-CM

## 2017-06-21 DIAGNOSIS — M1612 Unilateral primary osteoarthritis, left hip: Secondary | ICD-10-CM

## 2017-06-22 ENCOUNTER — Ambulatory Visit
Admission: RE | Admit: 2017-06-22 | Discharge: 2017-06-22 | Disposition: A | Discharge status: Home / Self Care | Payer: Medicare Other | Source: Ambulatory Visit | Attending: Radiation Oncology | Admitting: Radiation Oncology

## 2017-06-22 ENCOUNTER — Ambulatory Visit (HOSPITAL_COMMUNITY): Payer: Medicare Other

## 2017-06-22 ENCOUNTER — Encounter (HOSPITAL_COMMUNITY)
Admission: RE | Admit: 2017-06-22 | Discharge: 2017-06-22 | Disposition: A | Discharge status: Home / Self Care | Payer: Medicare Other | Source: Ambulatory Visit | Attending: Adult Health | Admitting: Adult Health

## 2017-06-22 ENCOUNTER — Ambulatory Visit (HOSPITAL_COMMUNITY)
Admission: RE | Admit: 2017-06-22 | Discharge: 2017-06-22 | Disposition: A | Discharge status: Home / Self Care | Payer: Medicare Other | Source: Ambulatory Visit | Attending: Adult Health | Admitting: Adult Health

## 2017-06-22 ENCOUNTER — Encounter (HOSPITAL_COMMUNITY): Payer: Medicare Other

## 2017-06-22 DIAGNOSIS — C50411 Malignant neoplasm of upper-outer quadrant of right female breast: Secondary | ICD-10-CM

## 2017-06-22 DIAGNOSIS — Z510 Encounter for antineoplastic radiation therapy: Secondary | ICD-10-CM | POA: Diagnosis not present

## 2017-06-22 DIAGNOSIS — Z170 Estrogen receptor positive status [ER+]: Secondary | ICD-10-CM

## 2017-06-22 DIAGNOSIS — J439 Emphysema, unspecified: Secondary | ICD-10-CM | POA: Diagnosis not present

## 2017-06-22 DIAGNOSIS — I700 Atherosclerosis of aorta: Secondary | ICD-10-CM | POA: Insufficient documentation

## 2017-06-22 DIAGNOSIS — R937 Abnormal findings on diagnostic imaging of other parts of musculoskeletal system: Secondary | ICD-10-CM

## 2017-06-22 DIAGNOSIS — C50911 Malignant neoplasm of unspecified site of right female breast: Secondary | ICD-10-CM | POA: Diagnosis not present

## 2017-06-22 DIAGNOSIS — M1612 Unilateral primary osteoarthritis, left hip: Secondary | ICD-10-CM | POA: Insufficient documentation

## 2017-06-22 DIAGNOSIS — N289 Disorder of kidney and ureter, unspecified: Secondary | ICD-10-CM | POA: Insufficient documentation

## 2017-06-22 DIAGNOSIS — Z853 Personal history of malignant neoplasm of breast: Secondary | ICD-10-CM | POA: Diagnosis not present

## 2017-06-22 MED ORDER — IOPAMIDOL (ISOVUE-300) INJECTION 61%
INTRAVENOUS | Status: AC
  Filled 2017-06-22: qty 75

## 2017-06-22 MED ORDER — IOPAMIDOL (ISOVUE-300) INJECTION 61%
75.0000 mL | Freq: Once | INTRAVENOUS | Status: AC | PRN
  Administered 2017-06-22: 75 mL via INTRAVENOUS

## 2017-06-22 MED ORDER — TECHNETIUM TC 99M MEDRONATE IV KIT
20.2000 | PACK | Freq: Once | INTRAVENOUS | Status: AC | PRN
  Administered 2017-06-22: 20.2 via INTRAVENOUS

## 2017-06-23 ENCOUNTER — Ambulatory Visit: Payer: Medicare Other | Admitting: Radiation Oncology

## 2017-06-23 ENCOUNTER — Ambulatory Visit
Admission: RE | Admit: 2017-06-23 | Discharge: 2017-06-23 | Disposition: A | Discharge status: Home / Self Care | Payer: Medicare Other | Source: Ambulatory Visit | Attending: Radiation Oncology | Admitting: Radiation Oncology

## 2017-06-23 ENCOUNTER — Telehealth: Payer: Self-pay | Admitting: Pharmacy Technician

## 2017-06-23 DIAGNOSIS — Z170 Estrogen receptor positive status [ER+]: Secondary | ICD-10-CM | POA: Diagnosis not present

## 2017-06-23 DIAGNOSIS — C50411 Malignant neoplasm of upper-outer quadrant of right female breast: Secondary | ICD-10-CM | POA: Diagnosis not present

## 2017-06-23 DIAGNOSIS — Z510 Encounter for antineoplastic radiation therapy: Secondary | ICD-10-CM | POA: Diagnosis not present

## 2017-06-23 NOTE — Telephone Encounter (Signed)
Oral Oncology Patient [place] to follow up on the status of the patient's application for assistance with Ibrance.   I was informed that since grant foundations were open, these resources had to be utilized first.    [name] has been enrolled with [employer].  (information about this is in a separate encounter)  [employer] application has been placed on hold and filed in the event that it is needed later in the year.   [name]. [name], [place] Patient [place] [phone] 06/23/2017 4:57 PM

## 2017-06-23 NOTE — Telephone Encounter (Signed)
Oral Oncology Patient [facility] Encounter  Patient has been conditionally approved for copay assistance with [employer] (TAF).    Current enrollment expires on 07/23/2017.  The patient will receive an enrollment application in the mail to complete and return to TAF.  This application's submission is required for the patient's continued enrollment after 07/23/2017.  I have discussed this with the patient. She will bring the application to the office on 06/29/2017.  I will submit it for her.    When fully enrolled, The Assistance Fund will cover all copayment expenses for Ibrance for the remainder of the calendar year.    [name] will now be filled at [facility].   The billing information is as follows and has been shared with [place].  Member ID: [id number] Group ID: [id number] PCN: [id number] BIN: [id number]  I will continue to follow up for full enrollment status.   Fabio Asa. [name], [place] Patient [place] [phone] 06/23/2017 4:58 PM

## 2017-06-26 ENCOUNTER — Ambulatory Visit
Admission: RE | Admit: 2017-06-26 | Discharge: 2017-06-26 | Disposition: A | Discharge status: Home / Self Care | Payer: Medicare Other | Source: Ambulatory Visit | Attending: Radiation Oncology | Admitting: Radiation Oncology

## 2017-06-26 DIAGNOSIS — Z170 Estrogen receptor positive status [ER+]: Secondary | ICD-10-CM | POA: Diagnosis not present

## 2017-06-26 DIAGNOSIS — Z510 Encounter for antineoplastic radiation therapy: Secondary | ICD-10-CM | POA: Diagnosis not present

## 2017-06-26 DIAGNOSIS — C50411 Malignant neoplasm of upper-outer quadrant of right female breast: Secondary | ICD-10-CM | POA: Diagnosis not present

## 2017-06-27 ENCOUNTER — Ambulatory Visit
Admission: RE | Admit: 2017-06-27 | Discharge: 2017-06-27 | Disposition: A | Discharge status: Home / Self Care | Payer: Medicare Other | Source: Ambulatory Visit | Attending: Radiation Oncology | Admitting: Radiation Oncology

## 2017-06-27 DIAGNOSIS — C50411 Malignant neoplasm of upper-outer quadrant of right female breast: Secondary | ICD-10-CM | POA: Diagnosis not present

## 2017-06-27 DIAGNOSIS — Z510 Encounter for antineoplastic radiation therapy: Secondary | ICD-10-CM | POA: Diagnosis not present

## 2017-06-27 DIAGNOSIS — Z170 Estrogen receptor positive status [ER+]: Secondary | ICD-10-CM | POA: Diagnosis not present

## 2017-06-28 ENCOUNTER — Ambulatory Visit
Admission: RE | Admit: 2017-06-28 | Discharge: 2017-06-28 | Disposition: A | Discharge status: Home / Self Care | Payer: Medicare Other | Source: Ambulatory Visit | Attending: Radiation Oncology | Admitting: Radiation Oncology

## 2017-06-28 ENCOUNTER — Ambulatory Visit: Payer: Medicare Other

## 2017-06-28 DIAGNOSIS — C50411 Malignant neoplasm of upper-outer quadrant of right female breast: Secondary | ICD-10-CM | POA: Diagnosis not present

## 2017-06-28 DIAGNOSIS — Z510 Encounter for antineoplastic radiation therapy: Secondary | ICD-10-CM | POA: Diagnosis not present

## 2017-06-28 DIAGNOSIS — Z170 Estrogen receptor positive status [ER+]: Secondary | ICD-10-CM | POA: Diagnosis not present

## 2017-06-28 NOTE — Progress Notes (Signed)
[name]  [phone] [phone]     ID: [name] DOB: Sep 06, 1945  MR#: [id number]  GBM#:[id number]  Patient Care Team: Biagio Borg, MD as PCP - Stevan Born, MD as Consulting Physician (General Surgery) Rogan Ecklund, Virgie Dad, MD as Consulting Physician (Oncology) Kyung Rudd, MD as Consulting Physician (Radiation Oncology) Bobbye Charleston, MD as Consulting Physician (Obstetrics and Gynecology) Nada Libman, MD as Referring Physician (Specialist) Vevelyn Royals, MD as Consulting Physician (Ophthalmology) Lamonte Sakai Rose Fillers, MD as Consulting Physician (Pulmonary Disease) OTHER MD:  CHIEF COMPLAINT: Estrogen receptor positive breast cancer  CURRENT TREATMENT: Letrozole, palbociclib, completing adjuvant radiation   BREAST CANCER HISTORY: From the original intake note:  Azaylea had screening mammography showing some suspicious calcifications in the right breast leading to right diagnostic mammography with ultrasonography 01/22/2016 at [facility]. The breast density was category C. In the upper right breast there was a 2.3 cm mass with additional masses measuring 0.9 and 0.7 cm. There was also a possible additional 0.8 mass in the lower inner quadrant. Ultrasound confirmed an irregular hypoechoic mass in the right breast upper outer quadrant measuring 2.0 cm. There were other masses measuring 0.7 and 0.8 cm by ultrasonography. The right axilla was sonographically benign.  Biopsy of a 12:00 and 4:00 mass in the right breast 01/22/2016 showed ([id number]) both specimens showing invasive ductal carcinoma, grade 1 or 2, both 95% estrogen receptor positive, both 95% progesterone receptor positive, both with strong staining intensity, with MIB-1 ranging from 10-15%, and both HER-2 negative, the signals ratio being 1.23-1.42, and the number per cell 1.85-2.59.  Her subsequent history is as detailed below  INTERVAL HISTORY: [name] returns today  for follow-up and treatment of her estrogen receptor positive stage IV breast cancer accompanied by her husband.  She continues on letrozole, with good tolerance. She notes that hot flashes and vaginal dryness is not an issue for her.  She also receives palbociclib, at 75 mg daily, 21 days on, 7 days off. She tolerates this well.  She continues on radiation, to be completed later this month. She notes that she has 4 more treatments. She reports some pain and fatigue. She notes that the exhaustion started yesterday.  Since her last visit, she completed a chest CT on 06/22/2017 showing: Marked interval skin thickening of the right breast likely due to radiation therapy. No findings for recurrent breast mass or supraclavicular or axillary adenopathy. No mediastinal or hilar mass or adenopathy. Small, sub 4 mm, scattered pulmonary nodules are stable. No new or progressive findings. Slowly enlarging upper pole right renal lesion suspicious for papillary renal cell carcinoma.   She also completed a bone scan on 06/22/2017 showing: Negative for metastatic disease. No focal abnormality.    REVIEW OF SYSTEMS: [name] reports that she keeps her grandchildren on the weekends. She also goes to see the movies and to church on Sundays. She saw a [facility] and  Enjoyed the movie. She denies unusual headaches, visual changes, nausea, vomiting, or dizziness. There has been no unusual cough, phlegm production, or pleurisy. This been no change in bowel or bladder habits. She denies unexplained fatigue or unexplained weight loss, bleeding, rash, or fever. A detailed review of systems was otherwise stable.    PAST MEDICAL HISTORY: Past Medical History:  Diagnosis Date  . Breast cancer ([place])   . Cancer ([place]) 02/2016   right breast  . DIABETES MELLITUS, TYPE II 01/04/2007   only takes actoplus daily  .  Dizziness and giddiness 02/29/2008  . DVT, HX OF    at age 64 in right buttocks  . Dyspnea    due to lung  cancer  . Family history of breast cancer   . GERD 01/04/2007   pt reports resolved   . GLAUCOMA 07/30/2008   both eyes  . History of blood transfusion    no abnormal  reaction  . History of uterine cancer 2000   hysterectomy done  . HYPERLIPIDEMIA 01/04/2007   taking Pravastatin daily  . HYPERTENSION 01/04/2007   takes Lisinopril daily  . Joint pain   . Joint swelling   . Leg cramps   . LEG PAIN, LEFT 07/06/2007  . NUMBNESS 07/30/2008   in fingers;pt states from Diamox  . OSTEOARTHRITIS, HIP 09/25/2009  . OTITIS MEDIA, ACUTE, BILATERAL 02/29/2008  . Overweight(278.02) 01/04/2007  . Peripheral vascular disease ([place])   . Pneumonia   . PONV (postoperative nausea and vomiting)   . SLEEP APNEA, OBSTRUCTIVE    doesn't use a cpap;study done about 34yr ago  . TRANSIENT ISCHEMIC ATTACK, HX OF 01/04/2007  . Vision loss    left eye    PAST SURGICAL HISTORY: Past Surgical History:  Procedure Laterality Date  . ABDOMINAL HYSTERECTOMY  2000  . BREAST LUMPECTOMY WITH RADIOACTIVE SEED AND SENTINEL LYMPH NODE BIOPSY Right 01/13/2017   Procedure: RIGHT BREAST RADIOACTIVE SEED X'S 2 GUIDED LUMPECTOMY WITH RADIOACTIVE SEED TARGETED AXILLARYLYMPH NODE EXCISION AND RIGHT AXILLARY SENTINEL LYMPH NODE BIOPSY;  Surgeon: NAlphonsa Overall MD;  [facility]  Service: General;  Laterality: Right;  2 SEEDS IN RIGHT BREAST 1 SEED IN RIGHT AXILLARY NODE  . CHOLECYSTECTOMY    . ENDOBRONCHIAL ULTRASOUND Bilateral 03/28/2016   Procedure: ENDOBRONCHIAL ULTRASOUND;  Surgeon: RCollene Gobble MD;  Location: [facility];  Service: Cardiopulmonary;  Laterality: Bilateral;  . EYE SURGERY  13   shunt left and lazer eye surgery on right cataract and retenia tear with repair  . growth removal  2004   from thumb  . KNEE ARTHROSCOPY Right   . mulitple eye surgeries     both eyes, cataracts with ioc done both eyes  . OOPHORECTOMY    . right lumpectomy with axillary node dissection Right 01/2017  . TOTAL HIP  ARTHROPLASTY  06/24/2011   Procedure: TOTAL HIP ARTHROPLASTY;  Surgeon: FKerin Salen  [facility]  Service: Orthopedics;  Laterality: Right;  . TOTAL HIP ARTHROPLASTY Left 11/12/2012   Dr RMayer Camel . TOTAL HIP ARTHROPLASTY Left 11/12/2012   Procedure: TOTAL HIP ARTHROPLASTY;  Surgeon: FKerin Salen MD;  [facility]  Service: Orthopedics;  Laterality: Left;  DEPUY PINNACLE    FAMILY HISTORY Family History  Problem Relation Age of Onset  . Dementia Mother   . Cancer Mother        Breast and lung cancer  . Stroke Sister   . Breast cancer Sister 579 . Heart attack Maternal Aunt   . Lung cancer Maternal Grandmother        non smoker  . Glaucoma Maternal Grandfather   . Anesthesia problems Neg Hx   The patient's father died at age 28764 the patient's mother died at age 72 She had breast and lung cancers diagnosed shortly before her death. The patient had no brothers, 2 sisters. One sister was diagnosed with breast cancer at the age of 536  GYNECOLOGIC HISTORY:  No LMP recorded. Patient has had a hysterectomy. Menarche age 72 first live birth age 72  the patient is GX P1. She had a hysterectomy for endometrial cancer in the year 2000. She did not take hormone replacement. She did use oral contraceptives for more than 20 years remotely, with no complications.  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 She is home with her husband [name]. He is a retired Dealer.Their son [name] also lives in [place].    ADVANCED DIRECTIVES: In place  HEALTH MAINTENANCE: Social History   Tobacco Use  . Smoking status: Never Smoker  . Smokeless tobacco: Never Used  Substance Use Topics  . Alcohol use: No  . Drug use: No     Colonoscopy: Never  PAP: Status post hysterectomy  Bone density: Remote   Allergies  Allergen Reactions  . Codeine Hives    Hycodan syrup  . Fluorescein Nausea And Vomiting     ? IV dye for retina specialist  . Lipitor [Atorvastatin Calcium]     Leg cramp  . Oxycodone Nausea And Vomiting    Patient vomited for 3 days after taking  . Sitagliptin Phosphate Nausea And Vomiting  . Sulfa Drugs Cross Reactors Nausea And Vomiting    Current Outpatient Medications  Medication Sig Dispense Refill  . acetaminophen (TYLENOL) 500 MG tablet Take 1,000 mg by mouth every 4 (four) hours as needed for moderate pain or fever.    Marland Kitchen aspirin EC 81 MG tablet Take 81 mg by mouth daily at 6 PM. 1700    . bimatoprost (LUMIGAN) 0.01 % SOLN Place 1 drop into both eyes at bedtime.    . brimonidine-timolol (COMBIGAN) 0.2-0.5 % ophthalmic solution Place 1 drop into both eyes three times daily    . cholecalciferol (VITAMIN D) 1000 units tablet Take 1,000 Units by mouth daily.    . dorzolamide-timolol (COSOPT) 22.3-6.8 MG/ML ophthalmic solution Place 1 drop into both eyes 2 (two) times daily.    . hyaluronate sodium (RADIAPLEXRX) GEL Apply 1 application topically 2 (two) times daily.    Marland Kitchen letrozole (FEMARA) 2.5 MG tablet Take 1 tablet (2.5 mg total) by mouth at bedtime. 90 tablet 3  . lisinopril (PRINIVIL,ZESTRIL) 20 MG tablet Take 1 tablet (20 mg total) by mouth daily. (Patient taking differently: Take 20 mg by mouth daily at 6 PM. 1700) 90 tablet 3  . lovastatin (MEVACOR) 20 MG tablet TAKE ONE TABLET BY MOUTH IN THE EVENING 90 tablet 0  . non-metallic deodorant (ALRA) MISC Apply 1 application topically daily as needed.    . palbociclib (IBRANCE) 75 MG capsule Take 1 capsule (75 mg total) by mouth daily with breakfast. Take whole with food. 21 capsule 6  . pioglitazone-metformin (ACTOPLUS MET) 15-500 MG tablet Take 15-500 tablets by mouth daily. (Patient taking differently: Take 1 tablet by mouth daily at 6 PM. 1700) 90 tablet 3  . prochlorperazine (COMPAZINE) 10 MG tablet Take 1 tablet (10 mg total) by mouth every 6 (six) hours as needed for nausea or vomiting. (Patient not taking: Reported on  04/25/2017) 30 tablet 0   No current facility-administered medications for this visit.      OBJECTIVE: Middle-aged white woman who appears stated age  37:   06/29/17 1500  BP: (!) 177/88  Pulse: 69  Resp: 18  Temp: 98.8 F (37.1 C)  SpO2: 100%     Body mass index is 39.45 kg/m.    ECOG FS:1 - Symptomatic but completely ambulatory Filed Weights   06/29/17 1500  Weight: 229 lb 12.8 oz (104.2 kg)   Sclerae unicteric, EOMs intact No cervical or supraclavicular adenopathy Lungs no rales or rhonchi Heart regular rate and rhythm Abd soft, nontender, positive bowel sounds MSK no focal spinal tenderness, no upper extremity lymphedema Neuro: nonfocal, well oriented, appropriate affect Breasts: The right breast is currently receiving radiation, after recent lumpectomy.  There is diffuse erythema, which is moderate, and then there is some desquamation particularly in the inframammary fold.  There is erythema in the right supraclavicular area as well.  The left breast is unremarkable.  LAB RESULTS:  CMP     Component Value Date/Time   NA 141 06/29/2017 1436   NA 140 05/29/2017 1254   K 4.2 06/29/2017 1436   K 4.4 05/29/2017 1254   CL 106 06/29/2017 1436   CO2 27 06/29/2017 1436   CO2 25 05/29/2017 1254   GLUCOSE 131 06/29/2017 1436   GLUCOSE 154 (H) 05/29/2017 1254   BUN 10 06/29/2017 1436   BUN 11.5 05/29/2017 1254   CREATININE 0.84 06/29/2017 1436   CREATININE 0.8 05/29/2017 1254   CALCIUM 9.4 06/29/2017 1436   CALCIUM 9.3 05/29/2017 1254   PROT 7.1 06/29/2017 1436   PROT 7.0 05/29/2017 1254   ALBUMIN 4.0 06/29/2017 1436   ALBUMIN 4.1 05/29/2017 1254   AST 13 06/29/2017 1436   AST 13 05/29/2017 1254   ALT 16 06/29/2017 1436   ALT 14 05/29/2017 1254   ALKPHOS 68 06/29/2017 1436   ALKPHOS 66 05/29/2017 1254   BILITOT 0.5 06/29/2017 1436   BILITOT 0.50 05/29/2017 1254   GFRNONAA >60 06/29/2017 1436   GFRAA >60 06/29/2017 1436    INo results found for: SPEP,  UPEP  Lab Results  Component Value Date   WBC 2.7 (L) 06/29/2017   NEUTROABS 1.8 06/29/2017   HGB 12.5 06/29/2017   HCT 37.2 06/29/2017   MCV 90.6 06/29/2017   PLT 159 06/29/2017      Chemistry      Component Value Date/Time   NA 141 06/29/2017 1436   NA 140 05/29/2017 1254   K 4.2 06/29/2017 1436   K 4.4 05/29/2017 1254   CL 106 06/29/2017 1436   CO2 27 06/29/2017 1436   CO2 25 05/29/2017 1254   BUN 10 06/29/2017 1436   BUN 11.5 05/29/2017 1254   CREATININE 0.84 06/29/2017 1436   CREATININE 0.8 05/29/2017 1254      Component Value Date/Time   CALCIUM 9.4 06/29/2017 1436   CALCIUM 9.3 05/29/2017 1254   ALKPHOS 68 06/29/2017 1436   ALKPHOS 66 05/29/2017 1254   AST 13 06/29/2017 1436   AST 13 05/29/2017 1254   ALT 16 06/29/2017 1436   ALT 14 05/29/2017 1254   BILITOT 0.5 06/29/2017 1436   BILITOT 0.50 05/29/2017 1254       No results found for: LABCA2  No components found for: LABCA125  No results for input(s): INR in the last 168 hours.  Urinalysis    Component Value Date/Time   COLORURINE YELLOW 07/22/2016 1132   APPEARANCEUR HAZY (A) 07/22/2016 1132   LABSPEC 1.013 07/22/2016 1132   PHURINE 5.0 07/22/2016 1132   GLUCOSEU NEGATIVE 07/22/2016 1132   GLUCOSEU NEGATIVE 10/30/2014 1513   HGBUR MODERATE (A) 07/22/2016 1132   BILIRUBINUR NEGATIVE 07/22/2016 1132   KETONESUR 5 (A) 07/22/2016 1132   PROTEINUR NEGATIVE 07/22/2016 1132   UROBILINOGEN 0.2 10/30/2014 1513   NITRITE NEGATIVE 07/22/2016 1132   LEUKOCYTESUR LARGE (A) 07/22/2016 1132  STUDIES: Ct Chest W Contrast  Result Date: 06/23/2017 CLINICAL DATA:  Followup metastatic breast cancer. EXAM: CT CHEST WITH CONTRAST TECHNIQUE: Multidetector CT imaging of the chest was performed during intravenous contrast administration. CONTRAST:  34m ISOVUE-300 IOPAMIDOL (ISOVUE-300) INJECTION 61% COMPARISON:  10/05/2016 FINDINGS: Cardiovascular: The heart is normal in size. No pericardial effusion. The  aorta is normal in caliber. No atherosclerotic calcifications. Stable coronary artery calcifications mainly involving the LAD. Mediastinum/Nodes: No mediastinal or hilar mass or lymphadenopathy. Small scattered lymph nodes are stable. The esophagus is grossly normal. Lungs/Pleura: Stable scattered sub 4 mm pulmonary nodules and areas of scarring change. No new pulmonary lesions or progressive findings. No acute pulmonary abnormality. No pleural effusion. Upper Abdomen: Stable geographic fatty infiltration of the liver and stable intra and extrahepatic biliary dilatation. No worrisome hepatic lesions or adrenal gland lesions. The pancreas appears normal. Indeterminate upper pole right renal lesion has slowly enlarged since 2017 and could reflect a papillary renal cell carcinoma. It measures a maximum of 2.8 cm and measured 2 cm on the CT scan from 2017. Recommend MR imaging without and with contrast for further evaluation. Musculoskeletal: Progressive marked skin thickening involving the right breast along with interstitial changes and postoperative changes. No obvious recurrent mass. No supraclavicular or axillary lymphadenopathy. The thyroid gland is stable. Left thyroid goiter noted. No lytic or sclerotic bone lesions to suggest metastatic disease. IMPRESSION: 1. Marked interval skin thickening of the right breast likely due to radiation therapy. 2. No findings for recurrent breast mass or supraclavicular or axillary adenopathy. 3. No mediastinal or hilar mass or adenopathy. 4. Small, sub 4 mm, scattered pulmonary nodules are stable. No new or progressive findings. 5. Slowly enlarging upper pole right renal lesion suspicious for papillary renal cell carcinoma. Recommend MR imaging without and with contrast for further evaluation. Aortic Atherosclerosis (ICD10-I70.0) and Emphysema (ICD10-J43.9). Electronically Signed   By: PMarijo SanesM.D.   On: 06/23/2017 08:09   Nm Bone Scan Whole Body  Result Date:  06/23/2017 CLINICAL DATA:  History of breast cancer. Question metastatic disease. No current symptoms. EXAM: NUCLEAR MEDICINE WHOLE BODY BONE SCAN TECHNIQUE: Whole body anterior and posterior images were obtained approximately 3 hours after intravenous injection of radiopharmaceutical. RADIOPHARMACEUTICALS:  20.2 mCi Technetium-971mDP IV COMPARISON:  CT chest 06/22/2016. FINDINGS: No abnormal osseous uptake to suggest metastatic disease is identified. Photopenia over the left chest wall may be due to mastectomy or a breast implant. Soft tissues are otherwise unremarkable. IMPRESSION: Negative for metastatic disease.  No focal abnormality. Electronically Signed   By: ThInge Rise.D.   On: 06/23/2017 08:17     ELIGIBLE FOR AVAILABLE RESEARCH PROTOCOL: Multiple, as noted below  ASSESSMENT: [age].o. [place] woman with a remote history of early stage endometrial cancer, now status post right breast upper outer quadrant biopsy 01/22/2016 for a clinically multifocal T2 N0, stage 2A invasive ductal carcinoma, grade 1, estrogen and progesterone receptor positive, HER-2 negative, with an MIB-1 between 10 and 15%.  (1) right axillary lymph node biopsy 03/02/2016 positive  (2) genetics testing 01/13/2016 through the Custom gene panel offered by [facility] found no deleterious mutations in  ATM, BARD1, BRCA1, BRCA2, BRIP1, CDH1, CHEK2, EPCAM, FANCC, MLH1, MSH2, MSH6, MUTYH, NBN, PALB2, PMS2, POLD1, PTEN, RAD51C, RAD51D, TP53, and XRCC2  METASTATIC DISEASE: OCT 2017 (3) CT scans of the chest abdomen and pelvis obtained 03/10/2016 are consistent with bilateral lung metastases and mediastinal and hilar nodal involvement, but no liver or bone spread  (a)  bronchoscopic lymph node biopsy 2 (station 7, 13R) 03/28/2016 confirms metastatic adenocarcinoma, estrogen receptor positive, HER-2 not amplified  (b) baseline CA-27-29 on 04/18/2016 was 137.5.  (4) letrozole started 03/15/2016, palbociclib added  03/29/2016 at 125 mg/day, 21/7  (a) dose decreased to 100 mg per day, 21/7, beginning with February cycle  (b) palbociclib held 01/31/2017, with increasing symptoms  (c) palbociclib resumed October 2018 at 75 mg daily  (5) status post double right lumpectomies and right axillary lymph node sampling 01/13/2017 for 2 separate invasive ductal carcinoma lesions, pT1a and pT1b, N1a, with negative margins, both lesions being estrogen and progesterone receptor positive and HER-2 negative  (6) adjuvant radiation ending 07/05/2017  (7) restaging studies:  (a) CT scan of the chest and bone scan 06/23/2017 showed stable scattered very small lung nodules, no bone lesions  (8) right upper pole renal lesion noted to be enlarging on CT scan 06/22/2017   PLAN: [name] is now a little over a year out from definitive diagnosis of metastatic breast cancer involving the lungs.  She has had a very good response to systemic treatment and will be completing her local treatment next week.  We are continuing the letrozole.  The palbociclib was decreased to 75 mg every other day.  She is currently in her "off" week and she will start again next week.  I think it would be prudent particularly since she is so tired to continue on an every other day palbociclib schedule until the following cycle.  She has enough tablets on hand to be able to do that.  Accordingly I have scheduled her to return to see me the second week in February and at that time we will decide whether she goes back to 100 daily or 75 daily or stays in 75 every other day.  We are going to obtain a mammogram no earlier than May and possibly in the fall.  This mammogram will serve as a new baseline so it will be a good idea to allow her to heal as much as possible so there is no confusion in the future regarding change in scars.  We discussed her right renal mass in detail.  She understands this is a surgical disease.  She has multiple comorbidities,  noted in the problem list, and she understands that this may affect treatment choices.  I will refer her to urology for further evaluation.  She will expect a call from their office.  She knows to call for any other issues that may develop before then.  Alicia Ackert, Virgie Dad, MD  06/29/17 3:52 PM Medical Oncology and Hematology [facility] [address] [place], [place] [address] Tel. [phone]    Fax. [phone]  This document serves as a record of services personally performed by Lurline Del, MD. It was created on his behalf by Sheron Nightingale, a trained medical scribe. The creation of this record is based on the scribe's personal observations and the provider's statements to them.   I have reviewed the above documentation for accuracy and completeness, and I agree with the above.

## 2017-06-29 ENCOUNTER — Telehealth: Payer: Self-pay | Admitting: Oncology

## 2017-06-29 ENCOUNTER — Ambulatory Visit
Admission: RE | Admit: 2017-06-29 | Discharge: 2017-06-29 | Disposition: A | Discharge status: Home / Self Care | Payer: Medicare Other | Source: Ambulatory Visit | Attending: Radiation Oncology | Admitting: Radiation Oncology

## 2017-06-29 ENCOUNTER — Telehealth: Payer: Self-pay | Admitting: Pharmacist

## 2017-06-29 ENCOUNTER — Ambulatory Visit: Payer: Medicare Other

## 2017-06-29 ENCOUNTER — Inpatient Hospital Stay: Payer: Medicare Other | Attending: Oncology

## 2017-06-29 ENCOUNTER — Inpatient Hospital Stay (HOSPITAL_BASED_OUTPATIENT_CLINIC_OR_DEPARTMENT_OTHER): Payer: Medicare Other | Admitting: Oncology

## 2017-06-29 VITALS — BP 177/88 | HR 69 | Temp 98.8°F | Resp 18 | Ht 64.0 in | Wt 229.8 lb

## 2017-06-29 DIAGNOSIS — C7801 Secondary malignant neoplasm of right lung: Secondary | ICD-10-CM

## 2017-06-29 DIAGNOSIS — C50411 Malignant neoplasm of upper-outer quadrant of right female breast: Secondary | ICD-10-CM

## 2017-06-29 DIAGNOSIS — Z79811 Long term (current) use of aromatase inhibitors: Secondary | ICD-10-CM

## 2017-06-29 DIAGNOSIS — C7802 Secondary malignant neoplasm of left lung: Secondary | ICD-10-CM | POA: Diagnosis not present

## 2017-06-29 DIAGNOSIS — N2889 Other specified disorders of kidney and ureter: Secondary | ICD-10-CM

## 2017-06-29 DIAGNOSIS — C7800 Secondary malignant neoplasm of unspecified lung: Secondary | ICD-10-CM

## 2017-06-29 DIAGNOSIS — Z923 Personal history of irradiation: Secondary | ICD-10-CM

## 2017-06-29 DIAGNOSIS — E119 Type 2 diabetes mellitus without complications: Secondary | ICD-10-CM | POA: Insufficient documentation

## 2017-06-29 DIAGNOSIS — C773 Secondary and unspecified malignant neoplasm of axilla and upper limb lymph nodes: Secondary | ICD-10-CM | POA: Diagnosis not present

## 2017-06-29 DIAGNOSIS — T451X5A Adverse effect of antineoplastic and immunosuppressive drugs, initial encounter: Secondary | ICD-10-CM

## 2017-06-29 DIAGNOSIS — Z8542 Personal history of malignant neoplasm of other parts of uterus: Secondary | ICD-10-CM

## 2017-06-29 DIAGNOSIS — Z170 Estrogen receptor positive status [ER+]: Secondary | ICD-10-CM | POA: Diagnosis not present

## 2017-06-29 DIAGNOSIS — I700 Atherosclerosis of aorta: Secondary | ICD-10-CM | POA: Insufficient documentation

## 2017-06-29 DIAGNOSIS — K760 Fatty (change of) liver, not elsewhere classified: Secondary | ICD-10-CM

## 2017-06-29 DIAGNOSIS — G620 Drug-induced polyneuropathy: Secondary | ICD-10-CM

## 2017-06-29 DIAGNOSIS — Z510 Encounter for antineoplastic radiation therapy: Secondary | ICD-10-CM | POA: Diagnosis not present

## 2017-06-29 LAB — COMPREHENSIVE METABOLIC PANEL
ALT: 16 U/L (ref 0–55)
AST: 13 U/L (ref 5–34)
Albumin: 4 g/dL (ref 3.5–5.0)
Alkaline Phosphatase: 68 U/L (ref 40–150)
Anion gap: 8 (ref 3–11)
BUN: 10 mg/dL (ref 7–26)
CO2: 27 mmol/L (ref 22–29)
Calcium: 9.4 mg/dL (ref 8.4–10.4)
Chloride: 106 mmol/L (ref 98–109)
Creatinine, Ser: 0.84 mg/dL (ref 0.60–1.10)
GFR calc Af Amer: >60 mL/min (ref >60)
GFR calc non Af Amer: >60 mL/min (ref >60)
Glucose, Bld: 131 mg/dL (ref 70–140)
Potassium: 4.2 mmol/L (ref 3.3–4.7)
Sodium: 141 mmol/L (ref 136–145)
Total Bilirubin: 0.5 mg/dL (ref 0.2–1.2)
Total Protein: 7.1 g/dL (ref 6.4–8.3)

## 2017-06-29 LAB — CBC WITH DIFFERENTIAL/PLATELET
Basophils Absolute: 0 10*3/uL (ref 0.0–0.1)
Basophils Relative: 1 %
Eosinophils Absolute: 0.1 10*3/uL (ref 0.0–0.5)
Eosinophils Relative: 2 %
HCT: 37.2 % (ref 34.8–46.6)
Hemoglobin: 12.5 g/dL (ref 11.6–15.9)
Lymphocytes Relative: 19 %
Lymphs Abs: 0.5 10*3/uL — ABNORMAL LOW (ref 0.9–3.3)
MCH: 30.3 pg (ref 25.1–34.0)
MCHC: 33.5 g/dL (ref 31.5–36.0)
MCV: 90.6 fL (ref 79.5–101.0)
Monocytes Absolute: 0.3 10*3/uL (ref 0.1–0.9)
Monocytes Relative: 10 %
Neutro Abs: 1.8 10*3/uL (ref 1.5–6.5)
Neutrophils Relative %: 68 %
Platelets: 159 10*3/uL (ref 145–400)
RBC: 4.11 MIL/uL (ref 3.70–5.45)
RDW: 16.9 % — ABNORMAL HIGH (ref 11.2–16.1)
WBC: 2.7 10*3/uL — ABNORMAL LOW (ref 3.9–10.3)

## 2017-06-29 MED ORDER — PALBOCICLIB 75 MG PO CAPS: 75.0000 mg | ORAL_CAPSULE | Freq: Every day | ORAL | 6 refills | Status: DC

## 2017-06-29 NOTE — Telephone Encounter (Signed)
Gave patient AVS and calendar of upcoming February appointments.  °

## 2017-06-29 NOTE — Telephone Encounter (Signed)
Oral Chemotherapy Pharmacist Encounter  Follow-Up Form  Spoke with patient today to follow up regarding patient's oral chemotherapy medication: Ibrance (palbociclib) for the treatment of hormone receptor positive, metastatic breast cancer, in conjunction with Femara, planned duration until disease progression or unacceptable toxicity  Original Start date of oral chemotherapy: 03/29/16  Pt is doing well today  Pt reports 0 doses of Ibrance 75mg  capsules, 1 capsule by mouth every other day, with breakfast, taken for 3 weeks on, 1 week off, repeated every 4 weeks, missed in the last month.   Pt reports the following side effects: none to report  Pertinent labs reviewed: OK for treatment.  Other Issues: Patient now with copayment assistance grant and will receive her [name] from the [facility]. New prescription has been sent.  Patient knows to call the office with questions or concerns. Oral Oncology Clinic will continue to follow.  Thank you,  Johny Drilling, PharmD, BCPS, BCOP 06/29/2017 2:34 PM Oral Oncology Clinic [phone]

## 2017-06-30 ENCOUNTER — Ambulatory Visit
Admission: RE | Admit: 2017-06-30 | Discharge: 2017-06-30 | Disposition: A | Discharge status: Home / Self Care | Payer: Medicare Other | Source: Ambulatory Visit | Attending: Radiation Oncology | Admitting: Radiation Oncology

## 2017-06-30 ENCOUNTER — Ambulatory Visit: Payer: Medicare Other

## 2017-06-30 DIAGNOSIS — Z170 Estrogen receptor positive status [ER+]: Secondary | ICD-10-CM

## 2017-06-30 DIAGNOSIS — C50411 Malignant neoplasm of upper-outer quadrant of right female breast: Secondary | ICD-10-CM

## 2017-06-30 DIAGNOSIS — Z510 Encounter for antineoplastic radiation therapy: Secondary | ICD-10-CM | POA: Diagnosis not present

## 2017-06-30 LAB — CANCER ANTIGEN 27.29: CA 27.29: 19 U/mL (ref 0.0–38.6)

## 2017-06-30 MED ORDER — RADIAPLEXRX EX GEL
Freq: Once | CUTANEOUS | Status: AC
  Administered 2017-06-30: 14:00:00 via TOPICAL

## 2017-07-03 ENCOUNTER — Encounter: Payer: Self-pay | Admitting: Oncology

## 2017-07-03 ENCOUNTER — Ambulatory Visit
Admission: RE | Admit: 2017-07-03 | Discharge: 2017-07-03 | Disposition: A | Discharge status: Home / Self Care | Payer: Medicare Other | Source: Ambulatory Visit | Attending: Radiation Oncology | Admitting: Radiation Oncology

## 2017-07-03 DIAGNOSIS — Z170 Estrogen receptor positive status [ER+]: Secondary | ICD-10-CM | POA: Diagnosis not present

## 2017-07-03 DIAGNOSIS — N2889 Other specified disorders of kidney and ureter: Secondary | ICD-10-CM | POA: Insufficient documentation

## 2017-07-03 DIAGNOSIS — C50411 Malignant neoplasm of upper-outer quadrant of right female breast: Secondary | ICD-10-CM | POA: Diagnosis not present

## 2017-07-03 DIAGNOSIS — Z510 Encounter for antineoplastic radiation therapy: Secondary | ICD-10-CM | POA: Diagnosis not present

## 2017-07-03 NOTE — Addendum Note (Signed)
Addended by: Chauncey Cruel on: 07/03/2017 03:14 PM   Modules accepted: Orders

## 2017-07-04 ENCOUNTER — Ambulatory Visit: Payer: Medicare Other

## 2017-07-04 ENCOUNTER — Ambulatory Visit
Admission: RE | Admit: 2017-07-04 | Discharge: 2017-07-04 | Disposition: A | Discharge status: Home / Self Care | Payer: Medicare Other | Source: Ambulatory Visit | Attending: Radiation Oncology | Admitting: Radiation Oncology

## 2017-07-04 DIAGNOSIS — Z170 Estrogen receptor positive status [ER+]: Secondary | ICD-10-CM | POA: Diagnosis not present

## 2017-07-04 DIAGNOSIS — C50411 Malignant neoplasm of upper-outer quadrant of right female breast: Secondary | ICD-10-CM | POA: Diagnosis not present

## 2017-07-04 DIAGNOSIS — Z510 Encounter for antineoplastic radiation therapy: Secondary | ICD-10-CM | POA: Diagnosis not present

## 2017-07-05 ENCOUNTER — Encounter: Payer: Self-pay | Admitting: Radiation Oncology

## 2017-07-05 ENCOUNTER — Ambulatory Visit
Admission: RE | Admit: 2017-07-05 | Discharge: 2017-07-05 | Disposition: A | Discharge status: Home / Self Care | Payer: Medicare Other | Source: Ambulatory Visit | Attending: Radiation Oncology | Admitting: Radiation Oncology

## 2017-07-05 DIAGNOSIS — C50411 Malignant neoplasm of upper-outer quadrant of right female breast: Secondary | ICD-10-CM | POA: Diagnosis not present

## 2017-07-05 DIAGNOSIS — Z170 Estrogen receptor positive status [ER+]: Secondary | ICD-10-CM | POA: Diagnosis not present

## 2017-07-05 DIAGNOSIS — Z510 Encounter for antineoplastic radiation therapy: Secondary | ICD-10-CM | POA: Diagnosis not present

## 2017-07-08 ENCOUNTER — Other Ambulatory Visit: Payer: Self-pay | Admitting: Internal Medicine

## 2017-07-11 ENCOUNTER — Other Ambulatory Visit: Payer: Self-pay | Admitting: Internal Medicine

## 2017-07-13 ENCOUNTER — Ambulatory Visit: Payer: Medicare Other

## 2017-07-14 NOTE — Telephone Encounter (Signed)
Oral Oncology Patient Advocate Encounter  Confirmed with [employer] that the patient has been fully enrolled in their copay assistance program until 06/12/2018.  The [place] will cover all of the patient's copay expenses for Ibrance until this date.    Fabio Asa. [name], [place] Patient [place] [phone] 07/14/2017 2:51 PM

## 2017-07-20 ENCOUNTER — Ambulatory Visit (INDEPENDENT_AMBULATORY_CARE_PROVIDER_SITE_OTHER): Payer: Medicare Other | Admitting: Internal Medicine

## 2017-07-20 ENCOUNTER — Encounter: Payer: Self-pay | Admitting: Internal Medicine

## 2017-07-20 VITALS — BP 126/86 | HR 76 | Temp 97.8°F | Ht 64.0 in | Wt 231.0 lb

## 2017-07-20 DIAGNOSIS — E785 Hyperlipidemia, unspecified: Secondary | ICD-10-CM

## 2017-07-20 DIAGNOSIS — E119 Type 2 diabetes mellitus without complications: Secondary | ICD-10-CM

## 2017-07-20 DIAGNOSIS — I10 Essential (primary) hypertension: Secondary | ICD-10-CM | POA: Diagnosis not present

## 2017-07-20 LAB — POCT GLYCOSYLATED HEMOGLOBIN (HGB A1C): Hemoglobin A1C: 6.7

## 2017-07-20 MED ORDER — LISINOPRIL 20 MG PO TABS: 20.0000 mg | ORAL_TABLET | Freq: Every day | ORAL | 3 refills | Status: DC

## 2017-07-20 MED ORDER — PIOGLITAZONE HCL-METFORMIN HCL 15-500 MG PO TABS: 1.0000 | ORAL_TABLET | Freq: Every day | ORAL | 3 refills | Status: DC

## 2017-07-20 MED ORDER — LOVASTATIN 20 MG PO TABS: 20.0000 mg | ORAL_TABLET | Freq: Every evening | ORAL | 3 refills | Status: DC

## 2017-07-20 NOTE — Progress Notes (Signed)
Subjective:    Patient ID: [name], female    DOB: 1945-06-16, 72 y.o.   MRN: [id number]  HPI   Here for yearly f/u;  Overall doing ok;  Pt denies Chest pain, worsening SOB, DOE, wheezing, orthopnea, PND, worsening LE edema, palpitations, dizziness or syncope.  Pt denies neurological change such as new headache, facial or extremity weakness.  Pt denies polydipsia, polyuria, or low sugar symptoms. Pt states overall good compliance with treatment and medications, good tolerability, and has been trying to follow appropriate diet.  Pt denies worsening depressive symptoms, suicidal ideation or panic. No fever, night sweats, wt loss, loss of appetite, or other constitutional symptoms.  Pt states good ability with ADL's, has low fall risk, home safety reviewed and adequate, no other significant changes in hearing or vision, and active with exercise.  Has a known renal mass with plan for urology appt in place. No other interval hx or new complaints  BP improved now more relaxed after finished XRT BP Readings from Last 3 Encounters:  07/20/17 126/86  06/29/17 (!) 177/88  05/29/17 (!) 171/82  Lost 6 lbs overall in last 6 mo with all her stress and treatment.  Wt Readings from Last 3 Encounters:  07/20/17 231 lb (104.8 kg)  06/29/17 229 lb 12.8 oz (104.2 kg)  06/12/17 231 lb (104.8 kg)  Declines immunizations, DXA.   Declines lab draw today Past Medical History:  Diagnosis Date  . Breast cancer ([place])   . Cancer ([place]) 02/2016   right breast  . DIABETES MELLITUS, TYPE II 01/04/2007   only takes actoplus daily  . Dizziness and giddiness 02/29/2008  . DVT, HX OF    at age 40 in right buttocks  . Dyspnea    due to lung cancer  . Family history of breast cancer   . GERD 01/04/2007   pt reports resolved   . GLAUCOMA 07/30/2008   both eyes  . History of blood transfusion    no abnormal  reaction  . History of uterine cancer 2000   hysterectomy done  . HYPERLIPIDEMIA 01/04/2007   taking  Pravastatin daily  . HYPERTENSION 01/04/2007   takes Lisinopril daily  . Joint pain   . Joint swelling   . Leg cramps   . LEG PAIN, LEFT 07/06/2007  . NUMBNESS 07/30/2008   in fingers;pt states from Diamox  . OSTEOARTHRITIS, HIP 09/25/2009  . OTITIS MEDIA, ACUTE, BILATERAL 02/29/2008  . Overweight(278.02) 01/04/2007  . Peripheral vascular disease ([place])   . Pneumonia   . PONV (postoperative nausea and vomiting)   . SLEEP APNEA, OBSTRUCTIVE    doesn't use a cpap;study done about 7yrs ago  . TRANSIENT ISCHEMIC ATTACK, HX OF 01/04/2007  . Vision loss    left eye   Past Surgical History:  Procedure Laterality Date  . ABDOMINAL HYSTERECTOMY  2000  . BREAST LUMPECTOMY WITH RADIOACTIVE SEED AND SENTINEL LYMPH NODE BIOPSY Right 01/13/2017   Procedure: RIGHT BREAST RADIOACTIVE SEED X'S 2 GUIDED LUMPECTOMY WITH RADIOACTIVE SEED TARGETED AXILLARYLYMPH NODE EXCISION AND RIGHT AXILLARY SENTINEL LYMPH NODE BIOPSY;  Surgeon: Alphonsa Overall, MD;  Location: [facility];  Service: General;  Laterality: Right;  2 SEEDS IN RIGHT BREAST 1 SEED IN RIGHT AXILLARY NODE  . CHOLECYSTECTOMY    . ENDOBRONCHIAL ULTRASOUND Bilateral 03/28/2016   Procedure: ENDOBRONCHIAL ULTRASOUND;  Surgeon: Collene Gobble, MD;  Location: [facility];  Service: Cardiopulmonary;  Laterality: Bilateral;  . EYE SURGERY  13   shunt left and lazer  eye surgery on right cataract and retenia tear with repair  . growth removal  2004   from thumb  . KNEE ARTHROSCOPY Right   . mulitple eye surgeries     both eyes, cataracts with ioc done both eyes  . OOPHORECTOMY    . right lumpectomy with axillary node dissection Right 01/2017  . TOTAL HIP ARTHROPLASTY  06/24/2011   Procedure: TOTAL HIP ARTHROPLASTY;  Surgeon: Kerin Salen;  Location: [facility];  Service: Orthopedics;  Laterality: Right;  . TOTAL HIP ARTHROPLASTY Left 11/12/2012   Dr Mayer Camel  . TOTAL HIP ARTHROPLASTY Left 11/12/2012   Procedure: TOTAL HIP ARTHROPLASTY;  Surgeon: Kerin Salen, MD;   Location: [facility];  Service: Orthopedics;  Laterality: Left;  DEPUY PINNACLE    reports that  has never smoked. she has never used smokeless tobacco. She reports that she does not drink alcohol or use drugs. family history includes Breast cancer (age of onset: 39) in her sister; Cancer in her mother; Dementia in her mother; Glaucoma in her maternal grandfather; Heart attack in her maternal aunt; Lung cancer in her maternal grandmother; Stroke in her sister. Allergies  Allergen Reactions  . Codeine Hives    Hycodan syrup  . Fluorescein Nausea And Vomiting    ? IV dye for retina specialist  . Lipitor [Atorvastatin Calcium]     Leg cramp  . Oxycodone Nausea And Vomiting    Patient vomited for 3 days after taking  . Sitagliptin Phosphate Nausea And Vomiting  . Sulfa Drugs Cross Reactors Nausea And Vomiting   Current Outpatient Medications on File Prior to Visit  Medication Sig Dispense Refill  . acetaminophen (TYLENOL) 500 MG tablet Take 1,000 mg by mouth every 4 (four) hours as needed for moderate pain or fever.    Marland Kitchen aspirin EC 81 MG tablet Take 81 mg by mouth daily at 6 PM. 1700    . bimatoprost (LUMIGAN) 0.01 % SOLN Place 1 drop into both eyes at bedtime.    . brimonidine-timolol (COMBIGAN) 0.2-0.5 % ophthalmic solution Place 1 drop into both eyes three times daily    . cholecalciferol (VITAMIN D) 1000 units tablet Take 1,000 Units by mouth daily.    . dorzolamide-timolol (COSOPT) 22.3-6.8 MG/ML ophthalmic solution Place 1 drop into both eyes 2 (two) times daily.    Marland Kitchen letrozole (FEMARA) 2.5 MG tablet Take 1 tablet (2.5 mg total) by mouth at bedtime. 90 tablet 3  . palbociclib (IBRANCE) 75 MG capsule Take 1 capsule (75 mg total) by mouth daily with breakfast. Take whole with food. 21 capsule 6  . prochlorperazine (COMPAZINE) 10 MG tablet Take 1 tablet (10 mg total) by mouth every 6 (six) hours as needed for nausea or vomiting. 30 tablet 0   No current facility-administered medications on  file prior to visit.    Review of Systems Constitutional: Negative for other unusual diaphoresis, sweats, appetite or weight changes HENT: Negative for other worsening hearing loss, ear pain, facial swelling, mouth sores or neck stiffness.   Eyes: Negative for other worsening pain, redness or other visual disturbance.  Respiratory: Negative for other stridor or swelling Cardiovascular: Negative for other palpitations or other chest pain  Gastrointestinal: Negative for worsening diarrhea or loose stools, blood in stool, distention or other pain Genitourinary: Negative for hematuria, flank pain or other change in urine volume.  Musculoskeletal: Negative for myalgias or other joint swelling.  Skin: Negative for other color change, or other wound or worsening drainage.  Neurological: Negative  for other syncope or numbness. Hematological: Negative for other adenopathy or swelling Psychiatric/Behavioral: Negative for hallucinations, other worsening agitation, SI, self-injury, or new decreased concentration All other system neg per pt    Objective:   Physical Exam BP 126/86   Pulse 76   Temp 97.8 F (36.6 C) (Oral)   Ht 5\' 4"  (1.626 m)   Wt 231 lb (104.8 kg)   SpO2 96%   BMI 39.65 kg/m  VS noted,  Constitutional: Pt is oriented to person, place, and time. Appears well-developed and well-nourished, in no significant distress and comfortable Head: Normocephalic and atraumatic  Eyes: Conjunctivae and EOM are normal. Pupils are equal, round, and reactive to light Right Ear: External ear normal without discharge Left Ear: External ear normal without discharge Nose: Nose without discharge or deformity Mouth/Throat: Oropharynx is without other ulcerations and moist  Neck: Normal range of motion. Neck supple. No JVD present. No tracheal deviation present or significant neck LA or mass Cardiovascular: Normal rate, regular rhythm, normal heart sounds and intact distal pulses.   Pulmonary/Chest:  WOB normal and breath sounds without rales or wheezing  Abdominal: Soft. Bowel sounds are normal. NT. No HSM  Musculoskeletal: Normal range of motion. Exhibits no edema Lymphadenopathy: Has no other cervical adenopathy.  Neurological: Pt is alert and oriented to person, place, and time. Pt has normal reflexes. No cranial nerve deficit. Motor grossly intact, Gait intact Skin: Skin is warm and dry. No rash noted or new ulcerations Psychiatric:  Has normal mood and affect. Behavior is normal without agitation No other exam findings  POCT glycosylated hemoglobin (Hb A1C)  Component 16:24  Hemoglobin A1C 6.7            Assessment & Plan:

## 2017-07-20 NOTE — Patient Instructions (Addendum)
Your A1c was OK today  Please continue all other medications as before, and refills have been done if requested.  Please have the pharmacy call with any other refills you may need.  Please continue your efforts at being more active, low cholesterol diet, and weight control.  You are otherwise up to date with prevention measures today.  Please keep your appointments with your specialists as you may have planned  Please return in 6 months, or sooner if needed 

## 2017-07-22 ENCOUNTER — Encounter: Payer: Self-pay | Admitting: Internal Medicine

## 2017-07-22 NOTE — Assessment & Plan Note (Signed)
Lab Results  Component Value Date   LDLCALC 117 (H) 04/29/2015  for lower chol diet, declines f/u lab today due to so many blood draws with tx recently

## 2017-07-22 NOTE — Assessment & Plan Note (Signed)
stable overall by history and exam, recent data reviewed with pt, and pt to continue medical treatment as before,  to f/u any worsening symptoms or concerns BP Readings from Last 3 Encounters:  07/20/17 126/86  06/29/17 (!) 177/88  05/29/17 (!) 171/82

## 2017-07-22 NOTE — Assessment & Plan Note (Signed)
stable overall by history and exam, recent data reviewed with pt, and pt to continue medical treatment as before,  to f/u any worsening symptoms or concerns Lab Results  Component Value Date   HGBA1C 6.7 07/20/2017

## 2017-07-24 NOTE — Progress Notes (Signed)
  Radiation Oncology         ([phone]) [phone] ________________________________  Name: [name] MRN: [id number]  Date: 07/05/2017  DOB: 1946/02/13  End of Treatment Note  Diagnosis:   72 y.o. female with metastatic stage IV, ER/PR positive, invasive ductal carcinoma of the right breast    Indication for treatment:  Curative       Radiation treatment dates:   05/17/2017 - 07/05/2017  Site/dose:   The patient initially received a dose of 50.4 Gy in 28 fractions to the right breast and supraclavicular region using whole-breast tangent fields. This was delivered using a 3-D conformal technique. The patient then received a boost to the seroma. This delivered an additional 10 Gy in 5 fractions using a 3-D technique. The total dose was 60.4 Gy.  Narrative: The patient tolerated radiation treatment relatively well.   The patient had some expected skin irritation as she progressed during treatment. Moist desquamation was not present at the end of treatment.  Plan: The patient has completed radiation treatment. The patient will return to radiation oncology clinic for routine followup in one month. I advised the patient to call or return sooner if they have any questions or concerns related to their recovery or treatment. ________________________________  Jodelle Gross, MD, PhD  This document serves as a record of services personally performed by Kyung Rudd, MD. It was created on his behalf by Rae Lips, a trained medical scribe. The creation of this record is based on the scribe's personal observations and the provider's statements to them. This document has been checked and approved by the attending provider.

## 2017-07-25 NOTE — Progress Notes (Signed)
[place]  Telephone:(336) [phone] Fax:(336) [phone]     ID: [name] DOB: 04-07-1946  MR#: [id number]  HWE#:[id number]  Patient Care Team: Biagio Borg, MD as PCP - Stevan Born, MD as Consulting Physician (General Surgery) Dakarri Kessinger, Virgie Dad, MD as Consulting Physician (Oncology) Kyung Rudd, MD as Consulting Physician (Radiation Oncology) Bobbye Charleston, MD as Consulting Physician (Obstetrics and Gynecology) Nada Libman, MD as Referring Physician (Specialist) Vevelyn Royals, MD as Consulting Physician (Ophthalmology) Lamonte Sakai Rose Fillers, MD as Consulting Physician (Pulmonary Disease) OTHER MD:  CHIEF COMPLAINT: Estrogen receptor positive breast cancer  CURRENT TREATMENT: Letrozole, palbociclib   INTERVAL HISTORY: [name] returns today for follow-up and treatment of her estrogen receptor positive stage IV breast cancer accomapnied by her husband. She completed radiation treatments on 07/05/2017. She continues on letrozole, with good tolerance. She denies having issues with hot flashes. She notes some vaginal dryness, but it is not a major issue. She pays $2 for this medication.  She also begins palbociclib, at 75 mg daily, 21 days on and 7 days off. She notes that today is day 1.   At the last visit she was referred to urology under Dr. Phebe Colla. She notes that she sees him on 07/31/2017.    REVIEW OF SYSTEMS: [name] reports that she is doing well overall. She notes that she is concerned that her breast cancer is spreading throughout her body, including her lungs and kidneys. She notes that she has sharp pains in her right breast that last less than a minute. She notes that she puts pressure on her breast to relieve the pain. She notes that the pain extends from the side of the breast to the back. She denies unusual headaches, visual changes, nausea, vomiting, or dizziness. There has been no unusual cough, phlegm production, or pleurisy. This  been no change in bowel or bladder habits. She denies unexplained fatigue or unexplained weight loss, bleeding, rash, or fever. A detailed review of systems was otherwise stable.   BREAST CANCER HISTORY: From the original intake note:  [name] had screening mammography showing some suspicious calcifications in the right breast leading to right diagnostic mammography with ultrasonography 01/22/2016 at [facility]. The breast density was category C. In the upper right breast there was a 2.3 cm mass with additional masses measuring 0.9 and 0.7 cm. There was also a possible additional 0.8 mass in the lower inner quadrant. Ultrasound confirmed an irregular hypoechoic mass in the right breast upper outer quadrant measuring 2.0 cm. There were other masses measuring 0.7 and 0.8 cm by ultrasonography. The right axilla was sonographically benign.  Biopsy of a 12:00 and 4:00 mass in the right breast 01/22/2016 showed ([id number]) both specimens showing invasive ductal carcinoma, grade 1 or 2, both 95% estrogen receptor positive, both 95% progesterone receptor positive, both with strong staining intensity, with MIB-1 ranging from 10-15%, and both HER-2 negative, the signals ratio being 1.23-1.42, and the number per cell 1.85-2.59.  Her subsequent history is as detailed below    PAST MEDICAL HISTORY: Past Medical History:  Diagnosis Date  . Breast cancer ([place])   . Cancer ([place]) 02/2016   right breast  . DIABETES MELLITUS, TYPE II 01/04/2007   only takes actoplus daily  . Dizziness and giddiness 02/29/2008  . DVT, HX OF    at age 44 in right buttocks  . Dyspnea    due to lung cancer  . Family history of breast cancer   . GERD 01/04/2007  pt reports resolved   . GLAUCOMA 07/30/2008   both eyes  . History of blood transfusion    no abnormal  reaction  . History of uterine cancer 2000   hysterectomy done  . HYPERLIPIDEMIA 01/04/2007   taking Pravastatin daily  . HYPERTENSION 01/04/2007   takes Lisinopril  daily  . Joint pain   . Joint swelling   . Leg cramps   . LEG PAIN, LEFT 07/06/2007  . NUMBNESS 07/30/2008   in fingers;pt states from Diamox  . OSTEOARTHRITIS, HIP 09/25/2009  . OTITIS MEDIA, ACUTE, BILATERAL 02/29/2008  . Overweight(278.02) 01/04/2007  . Peripheral vascular disease ([place])   . Pneumonia   . PONV (postoperative nausea and vomiting)   . SLEEP APNEA, OBSTRUCTIVE    doesn't use a cpap;study done about 59yr ago  . TRANSIENT ISCHEMIC ATTACK, HX OF 01/04/2007  . Vision loss    left eye    PAST SURGICAL HISTORY: Past Surgical History:  Procedure Laterality Date  . ABDOMINAL HYSTERECTOMY  2000  . BREAST LUMPECTOMY WITH RADIOACTIVE SEED AND SENTINEL LYMPH NODE BIOPSY Right 01/13/2017   Procedure: RIGHT BREAST RADIOACTIVE SEED X'S 2 GUIDED LUMPECTOMY WITH RADIOACTIVE SEED TARGETED AXILLARYLYMPH NODE EXCISION AND RIGHT AXILLARY SENTINEL LYMPH NODE BIOPSY;  Surgeon: NAlphonsa Overall MD;  [facility] [facility]  Service: General;  Laterality: Right;  2 SEEDS IN RIGHT BREAST 1 SEED IN RIGHT AXILLARY NODE  . CHOLECYSTECTOMY    . ENDOBRONCHIAL ULTRASOUND Bilateral 03/28/2016   Procedure: ENDOBRONCHIAL ULTRASOUND;  Surgeon: RCollene Gobble MD;  Location: [facility];  Service: Cardiopulmonary;  Laterality: Bilateral;  . EYE SURGERY  13   shunt left and lazer eye surgery on right cataract and retenia tear with repair  . growth removal  2004   from thumb  . KNEE ARTHROSCOPY Right   . mulitple eye surgeries     both eyes, cataracts with ioc done both eyes  . OOPHORECTOMY    . right lumpectomy with axillary node dissection Right 01/2017  . TOTAL HIP ARTHROPLASTY  06/24/2011   Procedure: TOTAL HIP ARTHROPLASTY;  Surgeon: FKerin Salen  [place] [facility]  Service: Orthopedics;  Laterality: Right;  . TOTAL HIP ARTHROPLASTY Left 11/12/2012   Dr RMayer Camel . TOTAL HIP ARTHROPLASTY Left 11/12/2012   Procedure: TOTAL HIP ARTHROPLASTY;  Surgeon: FKerin Salen MD;  [facility] [facility]  Service: Orthopedics;   Laterality: Left;  DEPUY PINNACLE    FAMILY HISTORY Family History  Problem Relation Age of Onset  . Dementia Mother   . Cancer Mother        Breast and lung cancer  . Stroke Sister   . Breast cancer Sister 567 . Heart attack Maternal Aunt   . Lung cancer Maternal Grandmother        non smoker  . Glaucoma Maternal Grandfather   . Anesthesia problems Neg Hx   The patient's father died at age 72 the patient's mother died at age 381 She had breast and lung cancers diagnosed shortly before her death. The patient had no brothers, 2 sisters. One sister was diagnosed with breast cancer at the age of 536  GYNECOLOGIC HISTORY:  No LMP recorded. Patient has had a hysterectomy. Menarche age 72 first live birth age 72 the patient is GX P1. She had a hysterectomy for endometrial cancer in the year 2000. She did not take hormone replacement. She did use oral contraceptives for more than 20 years remotely, with no complications.  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 She is home with [name]. He is a retired Dealer.Their son [name] also lives in [place].    ADVANCED DIRECTIVES: In place  HEALTH MAINTENANCE: Social History   Tobacco Use  . Smoking status: Never Smoker  . Smokeless tobacco: Never Used  Substance Use Topics  . Alcohol use: No  . Drug use: No     Colonoscopy: Never  PAP: Status post hysterectomy  Bone density: Remote   Allergies  Allergen Reactions  . Codeine Hives    Hycodan syrup  . Fluorescein Nausea And Vomiting    ? IV dye for retina specialist  . Lipitor [Atorvastatin Calcium]     Leg cramp  . Oxycodone Nausea And Vomiting    Patient vomited for 3 days after taking  . Sitagliptin Phosphate Nausea And Vomiting  . Sulfa Drugs Cross Reactors Nausea And Vomiting    Current Outpatient Medications  Medication Sig Dispense Refill  . acetaminophen  (TYLENOL) 500 MG tablet Take 1,000 mg by mouth every 4 (four) hours as needed for moderate pain or fever.    Marland Kitchen aspirin EC 81 MG tablet Take 81 mg by mouth daily at 6 PM. 1700    . bimatoprost (LUMIGAN) 0.01 % SOLN Place 1 drop into both eyes at bedtime.    . brimonidine-timolol (COMBIGAN) 0.2-0.5 % ophthalmic solution Place 1 drop into both eyes three times daily    . cholecalciferol (VITAMIN D) 1000 units tablet Take 1,000 Units by mouth daily.    . dorzolamide-timolol (COSOPT) 22.3-6.8 MG/ML ophthalmic solution Place 1 drop into both eyes 2 (two) times daily.    Marland Kitchen letrozole (FEMARA) 2.5 MG tablet Take 1 tablet (2.5 mg total) by mouth at bedtime. 90 tablet 3  . lisinopril (PRINIVIL,ZESTRIL) 20 MG tablet Take 1 tablet (20 mg total) by mouth daily. 90 tablet 3  . lovastatin (MEVACOR) 20 MG tablet Take 1 tablet (20 mg total) by mouth every evening. 90 tablet 3  . palbociclib (IBRANCE) 75 MG capsule Take 1 capsule (75 mg total) by mouth daily with breakfast. Take whole with food. 21 capsule 6  . pioglitazone-metformin (ACTOPLUS MET) 15-500 MG tablet Take 1 tablet by mouth daily. 90 tablet 3  . prochlorperazine (COMPAZINE) 10 MG tablet Take 1 tablet (10 mg total) by mouth every 6 (six) hours as needed for nausea or vomiting. 30 tablet 0   No current facility-administered medications for this visit.      OBJECTIVE: Middle-aged white woman in no acute distress  Vitals:   07/28/17 1010  BP: (!) 157/62  Pulse: 62  Resp: 18  Temp: 98.4 F (36.9 C)  SpO2: 100%     Body mass index is 39.79 kg/m.    ECOG FS:1 - Symptomatic but completely ambulatory Filed Weights   07/28/17 1010  Weight: 231 lb 12.8 oz (105.1 kg)   Sclerae unicteric, pupils round and equal Oropharynx clear and moist No cervical or supraclavicular adenopathy Lungs no rales or rhonchi Heart regular rate and rhythm Abd soft, nontender, positive bowel sounds MSK no focal spinal tenderness, no upper extremity lymphedema Neuro:  nonfocal, well oriented, appropriate affect Breasts: The right breast is status post lumpectomy and radiation.  There is minimal remaining erythema, more like a blush.  The breast incisions are healing nicely.  There is some distortion of the breast contour.  There is no evidence of local recurrence or residual disease.  The left breast is benign.  Both axillae are benign  LAB RESULTS:  CMP     Component Value Date/Time   NA 141 06/29/2017 1436   NA 140 05/29/2017 1254   K 4.2 06/29/2017 1436   K 4.4 05/29/2017 1254   CL 106 06/29/2017 1436   CO2 27 06/29/2017 1436   CO2 25 05/29/2017 1254   GLUCOSE 131 06/29/2017 1436   GLUCOSE 154 (H) 05/29/2017 1254   BUN 10 06/29/2017 1436   BUN 11.5 05/29/2017 1254   CREATININE 0.84 06/29/2017 1436   CREATININE 0.8 05/29/2017 1254   CALCIUM 9.4 06/29/2017 1436   CALCIUM 9.3 05/29/2017 1254   PROT 7.1 06/29/2017 1436   PROT 7.0 05/29/2017 1254   ALBUMIN 4.0 06/29/2017 1436   ALBUMIN 4.1 05/29/2017 1254   AST 13 06/29/2017 1436   AST 13 05/29/2017 1254   ALT 16 06/29/2017 1436   ALT 14 05/29/2017 1254   ALKPHOS 68 06/29/2017 1436   ALKPHOS 66 05/29/2017 1254   BILITOT 0.5 06/29/2017 1436   BILITOT 0.50 05/29/2017 1254   GFRNONAA >60 06/29/2017 1436   GFRAA >60 06/29/2017 1436    INo results found for: SPEP, UPEP  Lab Results  Component Value Date   WBC 2.3 (L) 07/28/2017   NEUTROABS 1.4 (L) 07/28/2017   HGB 11.6 07/28/2017   HCT 34.5 (L) 07/28/2017   MCV 90.6 07/28/2017   PLT 142 (L) 07/28/2017      Chemistry      Component Value Date/Time   NA 141 06/29/2017 1436   NA 140 05/29/2017 1254   K 4.2 06/29/2017 1436   K 4.4 05/29/2017 1254   CL 106 06/29/2017 1436   CO2 27 06/29/2017 1436   CO2 25 05/29/2017 1254   BUN 10 06/29/2017 1436   BUN 11.5 05/29/2017 1254   CREATININE 0.84 06/29/2017 1436   CREATININE 0.8 05/29/2017 1254      Component Value Date/Time   CALCIUM 9.4 06/29/2017 1436   CALCIUM 9.3 05/29/2017  1254   ALKPHOS 68 06/29/2017 1436   ALKPHOS 66 05/29/2017 1254   AST 13 06/29/2017 1436   AST 13 05/29/2017 1254   ALT 16 06/29/2017 1436   ALT 14 05/29/2017 1254   BILITOT 0.5 06/29/2017 1436   BILITOT 0.50 05/29/2017 1254       No results found for: LABCA2  No components found for: LABCA125  No results for input(s): INR in the last 168 hours.  Urinalysis    Component Value Date/Time   COLORURINE YELLOW 07/22/2016 1132   APPEARANCEUR HAZY (A) 07/22/2016 1132   LABSPEC 1.013 07/22/2016 1132   PHURINE 5.0 07/22/2016 1132   GLUCOSEU NEGATIVE 07/22/2016 1132   GLUCOSEU NEGATIVE 10/30/2014 1513   HGBUR MODERATE (A) 07/22/2016 1132   BILIRUBINUR NEGATIVE 07/22/2016 1132   KETONESUR 5 (A) 07/22/2016 1132   PROTEINUR NEGATIVE 07/22/2016 1132   UROBILINOGEN 0.2 10/30/2014 1513   NITRITE NEGATIVE 07/22/2016 1132   LEUKOCYTESUR LARGE (A) 07/22/2016 1132     STUDIES: We reviewed the bone scan from 06/23/2017, which showed no areas of uptake, and the chest CT scan 06/23/2017 which showed the very small lung lesions to be stable, with no no new or progressive findings.  There was a right renal lesion suspicious for papillary renal carcinoma.  ELIGIBLE FOR AVAILABLE RESEARCH PROTOCOL: Multiple, as noted below  ASSESSMENT: 72 y.o. [place] woman with a remote history of early stage endometrial cancer, now status post right breast upper outer  quadrant biopsy 01/22/2016 for a clinically multifocal T2 N0, stage 2A invasive ductal carcinoma, grade 1, estrogen and progesterone receptor positive, HER-2 negative, with an MIB-1 between 10 and 15%.  (1) right axillary lymph node biopsy 03/02/2016 positive  (2) genetics testing 01/13/2016 through the Custom gene panel offered by [facility] found no deleterious mutations in  ATM, BARD1, BRCA1, BRCA2, BRIP1, CDH1, CHEK2, EPCAM, FANCC, MLH1, MSH2, MSH6, MUTYH, NBN, PALB2, PMS2, POLD1, PTEN, RAD51C, RAD51D, TP53, and XRCC2  METASTATIC DISEASE:  OCT 2017 (3) CT scans of the chest abdomen and pelvis obtained 03/10/2016 are consistent with bilateral lung metastases and mediastinal and hilar nodal involvement, but no liver or bone spread  (a) bronchoscopic lymph node biopsy 2 (station 7, 13R) 03/28/2016 confirms metastatic adenocarcinoma, estrogen receptor positive, HER-2 not amplified  (b) baseline CA-27-29 on 04/18/2016 was 137.5.  (4) letrozole started 03/15/2016, palbociclib added 03/29/2016 at 125 mg/day, 21/7  (a) dose decreased to 100 mg per day, 21/7, beginning with February cycle  (b) palbociclib held 01/31/2017, with increasing symptoms  (c) palbociclib resumed October 2018 at 75 mg daily  (5) status post double right lumpectomies and right axillary lymph node sampling 01/13/2017 for 2 separate invasive ductal carcinoma lesions, pT1a and pT1b, N1a, with negative margins, both lesions being estrogen and progesterone receptor positive and HER-2 negative  (6) adjuvant radiation completed 07/05/2017 1. 50.4 Gy in 28 fractions to the right breast and supraclavicular region using whole-breast tangent fields. 2. Boost to the seroma delivered an additional 10 Gy in 5 fractions. The total dose was 60.4 Gy.  (7) restaging studies:  (a) CT scan of the chest and bone scan 06/23/2017 showed stable scattered very small lung nodules, no bone lesions  (8) right upper pole renal lesion noted to be enlarging on CT scan 06/22/2017   PLAN: [name] has completed her local treatment, surgery and radiation, and is resuming her full systemic therapy with letrozole and palbociclib.  She tolerates these remarkably well and the plan is to continue these indefinitely, so long as there is no evidence of disease progression.  Her current dose of palbociclib is 75 mg daily 21 days on 7 days off.  Luckily she got approved so she will be receiving these at no cost.  We are going to be checking her labs on a monthly basis and adjusting the dose of  palbociclib as needed.  She will see me again in 3 months from now and we will obtain a CT scan of the chest prior to that visit.  We discussed her right renal lesion.  She will be seeing urology regarding this next week.  I explained to her that I do not believe this is anything to do with her breast cancer or her breast cancer treatment.  She knows to call for any other issues that may develop before the next visit.  Edwena Mayorga, Virgie Dad, MD  07/28/17 10:36 AM Medical Oncology and Hematology [facility] [address] [place], [place] [address] Tel. [phone]    Fax. [phone]  This document serves as a record of services personally performed by Lurline Del, MD. It was created on his behalf by Sheron Nightingale, a trained medical scribe. The creation of this record is based on the scribe's personal observations and the provider's statements to them.   I have reviewed the above documentation for accuracy and completeness, and I agree with the above.

## 2017-07-28 ENCOUNTER — Inpatient Hospital Stay (HOSPITAL_BASED_OUTPATIENT_CLINIC_OR_DEPARTMENT_OTHER): Payer: Medicare Other | Admitting: Oncology

## 2017-07-28 ENCOUNTER — Inpatient Hospital Stay: Payer: Medicare Other | Attending: Oncology

## 2017-07-28 ENCOUNTER — Telehealth: Payer: Self-pay | Admitting: Oncology

## 2017-07-28 VITALS — BP 157/62 | HR 62 | Temp 98.4°F | Resp 18 | Ht 64.0 in | Wt 231.8 lb

## 2017-07-28 DIAGNOSIS — K219 Gastro-esophageal reflux disease without esophagitis: Secondary | ICD-10-CM | POA: Diagnosis not present

## 2017-07-28 DIAGNOSIS — E785 Hyperlipidemia, unspecified: Secondary | ICD-10-CM | POA: Diagnosis not present

## 2017-07-28 DIAGNOSIS — K760 Fatty (change of) liver, not elsewhere classified: Secondary | ICD-10-CM

## 2017-07-28 DIAGNOSIS — Z86718 Personal history of other venous thrombosis and embolism: Secondary | ICD-10-CM

## 2017-07-28 DIAGNOSIS — Z803 Family history of malignant neoplasm of breast: Secondary | ICD-10-CM

## 2017-07-28 DIAGNOSIS — Z923 Personal history of irradiation: Secondary | ICD-10-CM

## 2017-07-28 DIAGNOSIS — Z801 Family history of malignant neoplasm of trachea, bronchus and lung: Secondary | ICD-10-CM | POA: Insufficient documentation

## 2017-07-28 DIAGNOSIS — C7801 Secondary malignant neoplasm of right lung: Secondary | ICD-10-CM | POA: Diagnosis not present

## 2017-07-28 DIAGNOSIS — E119 Type 2 diabetes mellitus without complications: Secondary | ICD-10-CM | POA: Diagnosis not present

## 2017-07-28 DIAGNOSIS — M1990 Unspecified osteoarthritis, unspecified site: Secondary | ICD-10-CM | POA: Insufficient documentation

## 2017-07-28 DIAGNOSIS — Z7982 Long term (current) use of aspirin: Secondary | ICD-10-CM | POA: Diagnosis not present

## 2017-07-28 DIAGNOSIS — C50411 Malignant neoplasm of upper-outer quadrant of right female breast: Secondary | ICD-10-CM

## 2017-07-28 DIAGNOSIS — Z8542 Personal history of malignant neoplasm of other parts of uterus: Secondary | ICD-10-CM | POA: Insufficient documentation

## 2017-07-28 DIAGNOSIS — Z79811 Long term (current) use of aromatase inhibitors: Secondary | ICD-10-CM | POA: Insufficient documentation

## 2017-07-28 DIAGNOSIS — Z7984 Long term (current) use of oral hypoglycemic drugs: Secondary | ICD-10-CM | POA: Insufficient documentation

## 2017-07-28 DIAGNOSIS — R232 Flushing: Secondary | ICD-10-CM

## 2017-07-28 DIAGNOSIS — G893 Neoplasm related pain (acute) (chronic): Secondary | ICD-10-CM

## 2017-07-28 DIAGNOSIS — Z79899 Other long term (current) drug therapy: Secondary | ICD-10-CM | POA: Insufficient documentation

## 2017-07-28 DIAGNOSIS — Z170 Estrogen receptor positive status [ER+]: Secondary | ICD-10-CM | POA: Insufficient documentation

## 2017-07-28 DIAGNOSIS — I10 Essential (primary) hypertension: Secondary | ICD-10-CM | POA: Insufficient documentation

## 2017-07-28 DIAGNOSIS — C7800 Secondary malignant neoplasm of unspecified lung: Secondary | ICD-10-CM

## 2017-07-28 LAB — CBC WITH DIFFERENTIAL/PLATELET
Basophils Absolute: 0 10*3/uL (ref 0.0–0.1)
Basophils Relative: 0 %
Eosinophils Absolute: 0.1 10*3/uL (ref 0.0–0.5)
Eosinophils Relative: 4 %
HCT: 34.5 % — ABNORMAL LOW (ref 34.8–46.6)
Hemoglobin: 11.6 g/dL (ref 11.6–15.9)
Lymphocytes Relative: 26 %
Lymphs Abs: 0.6 10*3/uL — ABNORMAL LOW (ref 0.9–3.3)
MCH: 30.4 pg (ref 25.1–34.0)
MCHC: 33.6 g/dL (ref 31.5–36.0)
MCV: 90.6 fL (ref 79.5–101.0)
Monocytes Absolute: 0.3 10*3/uL (ref 0.1–0.9)
Monocytes Relative: 11 %
Neutro Abs: 1.4 10*3/uL — ABNORMAL LOW (ref 1.5–6.5)
Neutrophils Relative %: 59 %
Platelets: 142 10*3/uL — ABNORMAL LOW (ref 145–400)
RBC: 3.81 MIL/uL (ref 3.70–5.45)
RDW: 15.2 % — ABNORMAL HIGH (ref 11.2–14.5)
WBC: 2.3 10*3/uL — ABNORMAL LOW (ref 3.9–10.3)

## 2017-07-28 LAB — COMPREHENSIVE METABOLIC PANEL
ALT: 17 U/L (ref 0–55)
AST: 18 U/L (ref 5–34)
Albumin: 3.7 g/dL (ref 3.5–5.0)
Alkaline Phosphatase: 66 U/L (ref 40–150)
Anion gap: 8 (ref 3–11)
BUN: 9 mg/dL (ref 7–26)
CO2: 24 mmol/L (ref 22–29)
Calcium: 9.1 mg/dL (ref 8.4–10.4)
Chloride: 109 mmol/L (ref 98–109)
Creatinine, Ser: 0.79 mg/dL (ref 0.60–1.10)
GFR calc Af Amer: >60 mL/min (ref >60)
GFR calc non Af Amer: >60 mL/min (ref >60)
Glucose, Bld: 162 mg/dL — ABNORMAL HIGH (ref 70–140)
Potassium: 4.3 mmol/L (ref 3.5–5.1)
Sodium: 141 mmol/L (ref 136–145)
Total Bilirubin: 0.5 mg/dL (ref 0.2–1.2)
Total Protein: 6.6 g/dL (ref 6.4–8.3)

## 2017-07-28 MED FILL — IBRANCE 75 MG CAPSULE: 75 | 28 days supply | Qty: 21 | Fill #0

## 2017-07-28 NOTE — Telephone Encounter (Signed)
Gave patient AVS and calendar of upcoming February through May appointments.  °

## 2017-07-29 LAB — CANCER ANTIGEN 27.29: CA 27.29: 18.2 U/mL (ref 0.0–38.6)

## 2017-07-31 DIAGNOSIS — D49511 Neoplasm of unspecified behavior of right kidney: Secondary | ICD-10-CM | POA: Diagnosis not present

## 2017-08-04 DIAGNOSIS — C50911 Malignant neoplasm of unspecified site of right female breast: Secondary | ICD-10-CM | POA: Diagnosis not present

## 2017-08-04 DIAGNOSIS — C7800 Secondary malignant neoplasm of unspecified lung: Secondary | ICD-10-CM | POA: Diagnosis not present

## 2017-08-08 ENCOUNTER — Encounter: Payer: Self-pay | Admitting: Radiation Oncology

## 2017-08-08 ENCOUNTER — Other Ambulatory Visit: Payer: Self-pay

## 2017-08-08 ENCOUNTER — Ambulatory Visit
Admission: RE | Admit: 2017-08-08 | Discharge: 2017-08-08 | Disposition: A | Discharge status: Home / Self Care | Payer: Medicare Other | Source: Ambulatory Visit | Attending: Radiation Oncology | Admitting: Radiation Oncology

## 2017-08-08 VITALS — BP 158/70 | HR 64 | Temp 98.2°F | Resp 20 | Ht 64.0 in | Wt 232.6 lb

## 2017-08-08 DIAGNOSIS — C7989 Secondary malignant neoplasm of other specified sites: Secondary | ICD-10-CM | POA: Diagnosis not present

## 2017-08-08 DIAGNOSIS — Z923 Personal history of irradiation: Secondary | ICD-10-CM | POA: Diagnosis not present

## 2017-08-08 DIAGNOSIS — C50412 Malignant neoplasm of upper-outer quadrant of left female breast: Secondary | ICD-10-CM | POA: Diagnosis not present

## 2017-08-08 DIAGNOSIS — Z170 Estrogen receptor positive status [ER+]: Secondary | ICD-10-CM | POA: Insufficient documentation

## 2017-08-08 DIAGNOSIS — Z79899 Other long term (current) drug therapy: Secondary | ICD-10-CM | POA: Insufficient documentation

## 2017-08-08 DIAGNOSIS — C50411 Malignant neoplasm of upper-outer quadrant of right female breast: Secondary | ICD-10-CM

## 2017-08-08 DIAGNOSIS — I10 Essential (primary) hypertension: Secondary | ICD-10-CM | POA: Insufficient documentation

## 2017-08-08 DIAGNOSIS — Z7982 Long term (current) use of aspirin: Secondary | ICD-10-CM | POA: Insufficient documentation

## 2017-08-08 NOTE — Progress Notes (Signed)
Radiation Oncology         [phone] ________________________________  Name: [name] MRN: [id number]  Date of Service: 08/08/2017  DOB: 1945-12-16  Post Treatment Note  CC: Biagio Borg, MD  Alphonsa Overall, MD  Diagnosis:  metastatic stage IV, ER/PR positive, invasive ductal carcinoma of the right breast     Interval Since Last Radiation:  5 weeks   05/17/2017 - 07/05/2017:  The patient initially received a dose of 50.4 Gy in 28 fractions to the right breast and supraclavicular region using whole-breast tangent fields. This was delivered using a 3-D conformal technique. The patient then received a boost to the seroma. This delivered an additional 10 Gy in 5 fractions using a 3-D technique. The total dose was 60.4 Gy.   Narrative:  The patient returns today for routine follow-up. In summary this is a patient who originally presented with stage IV disease, but had an excellent response to systemic therapies and proceeded with surgery and adjuvant radiotherapy as she had such a significant response. During treatment she did very well with radiotherapy and did not have significant desquamation.                             On review of systems, the patient states she's doing well. She denies any concerns with her skin at this time. No other complaints are noted.   ALLERGIES:  is allergic to codeine; fluorescein; lipitor [atorvastatin calcium]; oxycodone; sitagliptin phosphate; and sulfa drugs cross reactors.  Meds: Current Outpatient Medications  Medication Sig Dispense Refill  . acetaminophen (TYLENOL) 500 MG tablet Take 1,000 mg by mouth every 4 (four) hours as needed for moderate pain or fever.    Marland Kitchen aspirin EC 81 MG tablet Take 81 mg by mouth daily at 6 PM. 1700    . bimatoprost (LUMIGAN) 0.01 % SOLN Place 1 drop into both eyes at bedtime.    . brimonidine-timolol (COMBIGAN) 0.2-0.5 % ophthalmic solution Place 1 drop into both eyes three times daily    . cholecalciferol  (VITAMIN D) 1000 units tablet Take 1,000 Units by mouth daily.    . dorzolamide-timolol (COSOPT) 22.3-6.8 MG/ML ophthalmic solution Place 1 drop into both eyes 2 (two) times daily.    Marland Kitchen letrozole (FEMARA) 2.5 MG tablet Take 1 tablet (2.5 mg total) by mouth at bedtime. 90 tablet 3  . lisinopril (PRINIVIL,ZESTRIL) 20 MG tablet Take 1 tablet (20 mg total) by mouth daily. 90 tablet 3  . lovastatin (MEVACOR) 20 MG tablet Take 1 tablet (20 mg total) by mouth every evening. 90 tablet 3  . palbociclib (IBRANCE) 75 MG capsule Take 1 capsule (75 mg total) by mouth daily with breakfast. Take whole with food. 21 capsule 6  . pioglitazone-metformin (ACTOPLUS MET) 15-500 MG tablet Take 1 tablet by mouth daily. 90 tablet 3  . prochlorperazine (COMPAZINE) 10 MG tablet Take 1 tablet (10 mg total) by mouth every 6 (six) hours as needed for nausea or vomiting. (Patient not taking: Reported on 08/08/2017) 30 tablet 0   No current facility-administered medications for this encounter.     Physical Findings:  height is 5' 4" (1.626 m) and weight is 232 lb 9.6 oz (105.5 kg). Her oral temperature is 98.2 F (36.8 C). Her blood pressure is 158/70 (abnormal) and her pulse is 64. Her respiration is 20 and oxygen saturation is 100%.  Pain Assessment Pain Score: 0-No pain/10 In general this is a well  appearing caucasian female in no acute distress. She's alert and oriented x4 and appropriate throughout the examination. Cardiopulmonary assessment is negative for acute distress and she exhibits normal effort. The right breast was examined and reveals well healed breast and nodal sites with mild hyperpigmentation noted.   Lab Findings: Lab Results  Component Value Date   WBC 2.3 (L) 07/28/2017   HGB 11.6 07/28/2017   HCT 34.5 (L) 07/28/2017   MCV 90.6 07/28/2017   PLT 142 (L) 07/28/2017     Radiographic Findings: No results found.  Impression/Plan: 1. Metastatic stage IV, ER/PR positive, invasive ductal carcinoma of  the right breast. The patient has been doing well since completion of radiotherapy. We discussed that we would be happy to continue to follow her as needed, but she will also continue to follow up with Dr. Jana Hakim in medical oncology. She was counseled on skin care as well as measures to avoid sun exposure to this area.  2. HTN. The patient will continue her lisinopril and notify her PCP if her pressures are not improving.     Carola Rhine, PAC

## 2017-08-18 ENCOUNTER — Other Ambulatory Visit: Payer: Self-pay | Admitting: *Deleted

## 2017-08-18 ENCOUNTER — Inpatient Hospital Stay: Payer: Medicare Other | Attending: Oncology

## 2017-08-18 ENCOUNTER — Telehealth: Payer: Self-pay | Admitting: *Deleted

## 2017-08-18 DIAGNOSIS — C50411 Malignant neoplasm of upper-outer quadrant of right female breast: Secondary | ICD-10-CM

## 2017-08-18 DIAGNOSIS — R309 Painful micturition, unspecified: Secondary | ICD-10-CM

## 2017-08-18 DIAGNOSIS — C7801 Secondary malignant neoplasm of right lung: Secondary | ICD-10-CM | POA: Insufficient documentation

## 2017-08-18 DIAGNOSIS — Z923 Personal history of irradiation: Secondary | ICD-10-CM | POA: Diagnosis not present

## 2017-08-18 DIAGNOSIS — Z170 Estrogen receptor positive status [ER+]: Secondary | ICD-10-CM | POA: Diagnosis not present

## 2017-08-18 DIAGNOSIS — Z79811 Long term (current) use of aromatase inhibitors: Secondary | ICD-10-CM | POA: Diagnosis not present

## 2017-08-18 LAB — URINALYSIS, COMPLETE (UACMP) WITH MICROSCOPIC
Bilirubin Urine: NEGATIVE
Glucose, UA: NEGATIVE mg/dL
Ketones, ur: NEGATIVE mg/dL
Nitrite: NEGATIVE
Protein, ur: 30 mg/dL — AB
Specific Gravity, Urine: 1.01 (ref 1.005–1.030)
Squamous Epithelial / LPF: NONE SEEN
pH: 7 (ref 5.0–8.0)

## 2017-08-18 MED ORDER — CIPROFLOXACIN HCL 500 MG PO TABS: 500.0000 mg | ORAL_TABLET | Freq: Two times a day (BID) | ORAL | 0 refills | Status: DC

## 2017-08-18 MED FILL — IBRANCE 75 MG CAPSULE: 75 | 28 days supply | Qty: 21 | Fill #1

## 2017-08-18 NOTE — Telephone Encounter (Signed)
This RN spoke with pt and informed her of abnormal U/A and MD recommendation for Cipro.  Pt verbalized understanding - pharmacy verified and prescription sent per MD.

## 2017-08-21 ENCOUNTER — Other Ambulatory Visit: Payer: Self-pay | Admitting: Oncology

## 2017-08-21 ENCOUNTER — Telehealth: Payer: Self-pay | Admitting: *Deleted

## 2017-08-21 LAB — URINE CULTURE: Culture: >=100000 — AB

## 2017-08-21 NOTE — Telephone Encounter (Signed)
Patient called- urine culture results provided to patient. She reports that symptoms have mostly resolved since starting antibiotic.

## 2017-08-22 ENCOUNTER — Telehealth: Payer: Self-pay | Admitting: *Deleted

## 2017-08-22 ENCOUNTER — Inpatient Hospital Stay: Payer: Medicare Other

## 2017-08-22 DIAGNOSIS — Z170 Estrogen receptor positive status [ER+]: Secondary | ICD-10-CM | POA: Diagnosis not present

## 2017-08-22 DIAGNOSIS — C7801 Secondary malignant neoplasm of right lung: Secondary | ICD-10-CM

## 2017-08-22 DIAGNOSIS — C50411 Malignant neoplasm of upper-outer quadrant of right female breast: Secondary | ICD-10-CM

## 2017-08-22 DIAGNOSIS — K760 Fatty (change of) liver, not elsewhere classified: Secondary | ICD-10-CM

## 2017-08-22 DIAGNOSIS — Z923 Personal history of irradiation: Secondary | ICD-10-CM | POA: Diagnosis not present

## 2017-08-22 DIAGNOSIS — Z79811 Long term (current) use of aromatase inhibitors: Secondary | ICD-10-CM | POA: Diagnosis not present

## 2017-08-22 DIAGNOSIS — Z8542 Personal history of malignant neoplasm of other parts of uterus: Secondary | ICD-10-CM

## 2017-08-22 LAB — COMPREHENSIVE METABOLIC PANEL
ALT: 15 U/L (ref 0–55)
AST: 12 U/L (ref 5–34)
Albumin: 3.7 g/dL (ref 3.5–5.0)
Alkaline Phosphatase: 65 U/L (ref 40–150)
Anion gap: 8 (ref 3–11)
BUN: 12 mg/dL (ref 7–26)
CO2: 26 mmol/L (ref 22–29)
Calcium: 9.5 mg/dL (ref 8.4–10.4)
Chloride: 107 mmol/L (ref 98–109)
Creatinine, Ser: 0.87 mg/dL (ref 0.60–1.10)
GFR calc Af Amer: >60 mL/min (ref >60)
GFR calc non Af Amer: >60 mL/min (ref >60)
Glucose, Bld: 173 mg/dL — ABNORMAL HIGH (ref 70–140)
Potassium: 4.6 mmol/L (ref 3.5–5.1)
Sodium: 141 mmol/L (ref 136–145)
Total Bilirubin: 0.6 mg/dL (ref 0.2–1.2)
Total Protein: 6.9 g/dL (ref 6.4–8.3)

## 2017-08-22 LAB — CBC WITH DIFFERENTIAL/PLATELET
Basophils Absolute: 0 10*3/uL (ref 0.0–0.1)
Basophils Relative: 1 %
Eosinophils Absolute: 0 10*3/uL (ref 0.0–0.5)
Eosinophils Relative: 2 %
HCT: 32.8 % — ABNORMAL LOW (ref 34.8–46.6)
Hemoglobin: 11.1 g/dL — ABNORMAL LOW (ref 11.6–15.9)
Lymphocytes Relative: 28 %
Lymphs Abs: 0.5 10*3/uL — ABNORMAL LOW (ref 0.9–3.3)
MCH: 31 pg (ref 25.1–34.0)
MCHC: 33.9 g/dL (ref 31.5–36.0)
MCV: 91.5 fL (ref 79.5–101.0)
Monocytes Absolute: 0.2 10*3/uL (ref 0.1–0.9)
Monocytes Relative: 13 %
Neutro Abs: 1 10*3/uL — ABNORMAL LOW (ref 1.5–6.5)
Neutrophils Relative %: 56 %
Platelets: 168 10*3/uL (ref 145–400)
RBC: 3.58 MIL/uL — ABNORMAL LOW (ref 3.70–5.45)
RDW: 16.5 % — ABNORMAL HIGH (ref 11.2–14.5)
WBC: 1.8 10*3/uL — ABNORMAL LOW (ref 3.9–10.3)

## 2017-08-22 NOTE — Telephone Encounter (Signed)
This RN returned call to the patient per MD review of labs drawn today.  Informed pt MD stated to proceed with restart on 3/14 with QOD dosing.  [name] verbalized understanding. No further needs at this time.

## 2017-08-23 LAB — CANCER ANTIGEN 27.29: CA 27.29: 26.6 U/mL (ref 0.0–38.6)

## 2017-08-23 IMAGING — CR DG CHEST 2V
2 series · 2 of 2 positions shown · non-contrast
Comparison: 07/22/2016.  CT 06/22/2016.

CLINICAL DATA: Metastatic right breast cancer. Cough. Recent
influenza.

EXAM:
CHEST  2 VIEW

[w chest pa]
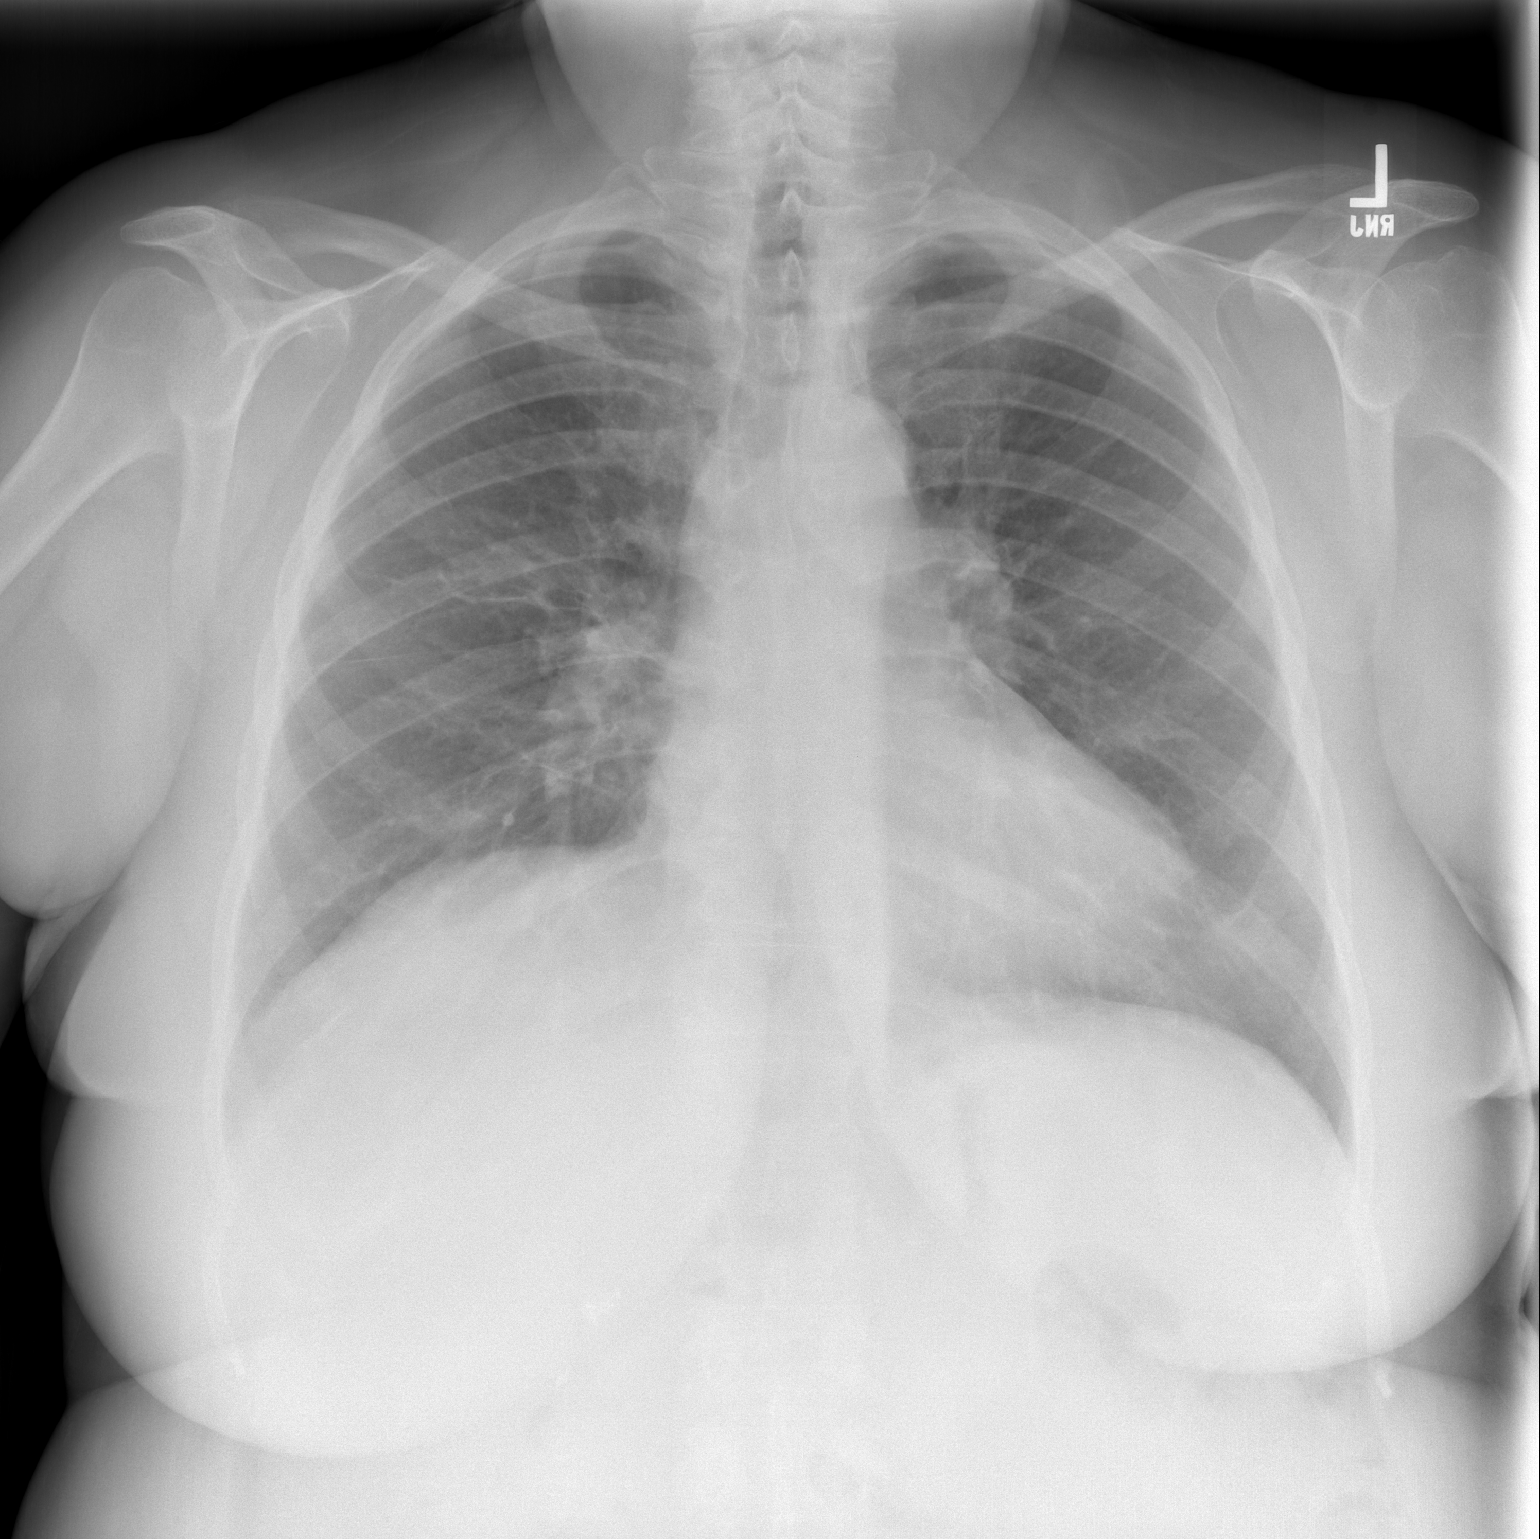

[w chest lat]
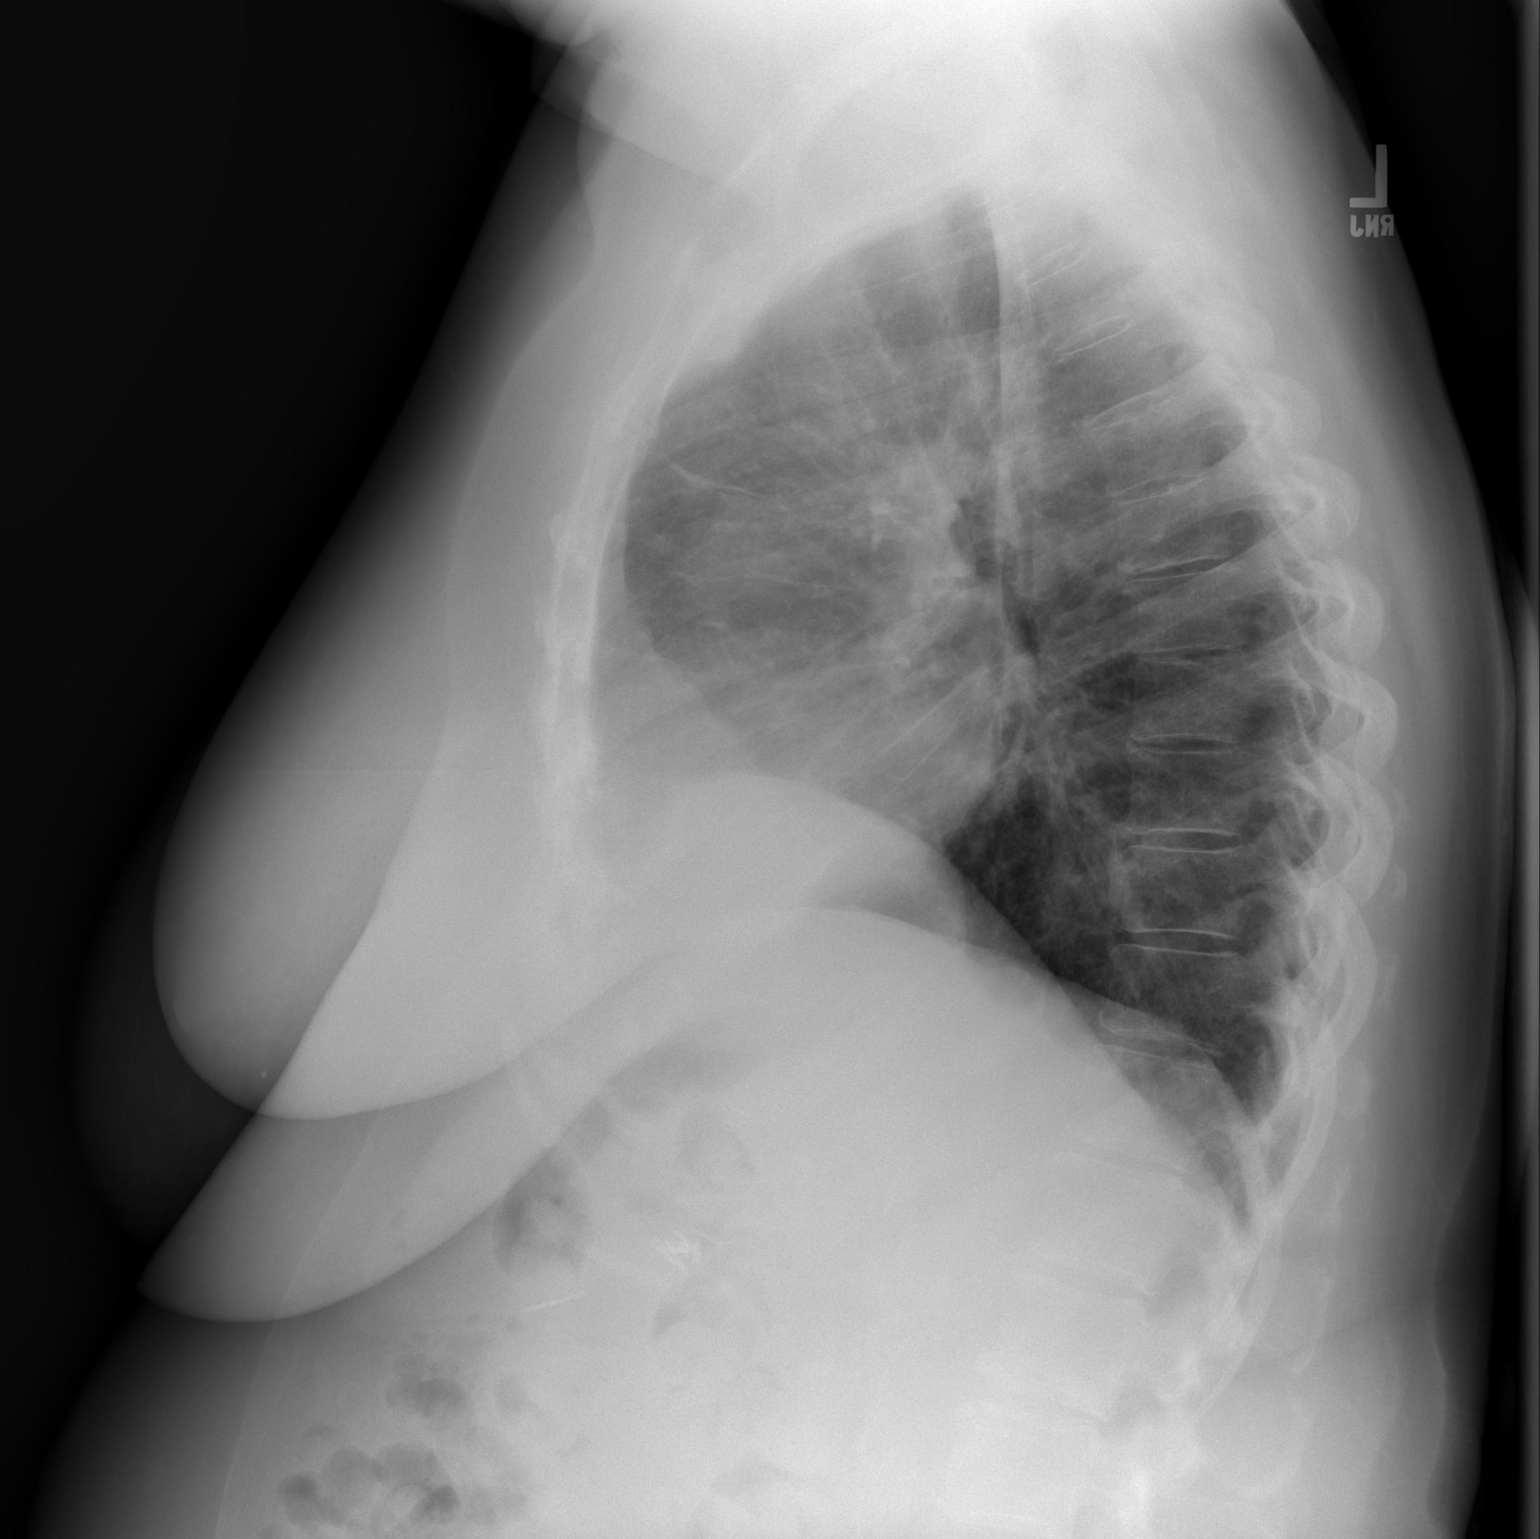

[2 of 2 positions shown; findings below may reference images not displayed]

FINDINGS: Heart size is normal. There is aortic atherosclerosis. The left
chest is clear. Chronic perihilar scarring on the right is again
seen. I think there may be slightly more density in the right middle
lobe, suggesting mild pneumonia. Discrete mass lesion is not
demonstrated. No effusion.
IMPRESSION: Chronic markings in the right perihilar region. Extension weighted
density in the right middle lobe on the lateral view, suggesting
mild right middle lobe pneumonia.

## 2017-09-04 ENCOUNTER — Telehealth: Payer: Self-pay | Admitting: Internal Medicine

## 2017-09-04 NOTE — Telephone Encounter (Signed)
Contacted pt regarding prescription refills; pt chart states that requested medications were ordered 07/20/17 with 3 refills; pt states that she did pick up these medications today but she wants her order to read 1 year; will confirm prescription dates with pharmacy.

## 2017-09-04 NOTE — Telephone Encounter (Signed)
Copied from [place] [phone]. Topic: Quick Communication - Rx Refill/Question >> Sep 04, 2017 10:17 AM Percell Belt A wrote: Medication:  Lovastatin Lisinopril  Metformin (Agent: If no, request that the patient contact the pharmacy for the refill.) Preferred Pharmacy (with phone number or street name): [facility]: Please be advised that RX refills may take up to 3 business days. We ask that you follow-up with your pharmacy.

## 2017-09-04 NOTE — Telephone Encounter (Signed)
Contacted [facility], [address] [place], spoke with Maudie Mercury, pharmacy technician, and she confirmed that the prescriptions were received on 07/20/17 90 tablets with 3 refills.

## 2017-09-11 ENCOUNTER — Other Ambulatory Visit: Payer: Self-pay | Admitting: Oncology

## 2017-09-11 DIAGNOSIS — Z170 Estrogen receptor positive status [ER+]: Secondary | ICD-10-CM

## 2017-09-11 DIAGNOSIS — C50411 Malignant neoplasm of upper-outer quadrant of right female breast: Secondary | ICD-10-CM

## 2017-09-11 DIAGNOSIS — C7800 Secondary malignant neoplasm of unspecified lung: Secondary | ICD-10-CM

## 2017-09-19 ENCOUNTER — Other Ambulatory Visit: Payer: Self-pay | Admitting: Oncology

## 2017-09-19 ENCOUNTER — Telehealth: Payer: Self-pay

## 2017-09-19 ENCOUNTER — Inpatient Hospital Stay: Payer: Medicare Other | Attending: Oncology

## 2017-09-19 DIAGNOSIS — C50411 Malignant neoplasm of upper-outer quadrant of right female breast: Secondary | ICD-10-CM | POA: Diagnosis not present

## 2017-09-19 DIAGNOSIS — C7800 Secondary malignant neoplasm of unspecified lung: Secondary | ICD-10-CM | POA: Insufficient documentation

## 2017-09-19 DIAGNOSIS — Z79811 Long term (current) use of aromatase inhibitors: Secondary | ICD-10-CM | POA: Diagnosis not present

## 2017-09-19 DIAGNOSIS — Z8542 Personal history of malignant neoplasm of other parts of uterus: Secondary | ICD-10-CM

## 2017-09-19 DIAGNOSIS — C7801 Secondary malignant neoplasm of right lung: Secondary | ICD-10-CM

## 2017-09-19 DIAGNOSIS — K760 Fatty (change of) liver, not elsewhere classified: Secondary | ICD-10-CM

## 2017-09-19 DIAGNOSIS — Z170 Estrogen receptor positive status [ER+]: Secondary | ICD-10-CM | POA: Diagnosis not present

## 2017-09-19 LAB — CBC WITH DIFFERENTIAL/PLATELET
Basophils Absolute: 0 10*3/uL (ref 0.0–0.1)
Basophils Relative: 0 %
Eosinophils Absolute: 0.1 10*3/uL (ref 0.0–0.5)
Eosinophils Relative: 2 %
HCT: 35.7 % (ref 34.8–46.6)
Hemoglobin: 11.9 g/dL (ref 11.6–15.9)
Lymphocytes Relative: 26 %
Lymphs Abs: 0.7 10*3/uL — ABNORMAL LOW (ref 0.9–3.3)
MCH: 31.2 pg (ref 25.1–34.0)
MCHC: 33.3 g/dL (ref 31.5–36.0)
MCV: 93.7 fL (ref 79.5–101.0)
Monocytes Absolute: 0.2 10*3/uL (ref 0.1–0.9)
Monocytes Relative: 7 %
Neutro Abs: 1.7 10*3/uL (ref 1.5–6.5)
Neutrophils Relative %: 65 %
Platelets: 145 10*3/uL (ref 145–400)
RBC: 3.81 MIL/uL (ref 3.70–5.45)
RDW: 15.1 % — ABNORMAL HIGH (ref 11.2–14.5)
WBC: 2.6 10*3/uL — ABNORMAL LOW (ref 3.9–10.3)

## 2017-09-19 LAB — COMPREHENSIVE METABOLIC PANEL
ALT: 15 U/L (ref 0–55)
AST: 13 U/L (ref 5–34)
Albumin: 3.7 g/dL (ref 3.5–5.0)
Alkaline Phosphatase: 67 U/L (ref 40–150)
Anion gap: 10 (ref 5–15)
BUN: 15 mg/dL (ref 7–26)
CO2: 25 mmol/L (ref 22–29)
Calcium: 9.4 mg/dL (ref 8.4–10.4)
Chloride: 107 mmol/L (ref 98–109)
Creatinine, Ser: 0.96 mg/dL (ref 0.60–1.10)
GFR calc Af Amer: >60 mL/min (ref >60)
GFR calc non Af Amer: 58 mL/min — ABNORMAL LOW (ref >60)
Glucose, Bld: 225 mg/dL — ABNORMAL HIGH (ref 70–140)
Potassium: 4.5 mmol/L (ref 3.5–5.1)
Sodium: 142 mmol/L (ref 136–145)
Total Bilirubin: 0.4 mg/dL (ref 0.2–1.2)
Total Protein: 6.7 g/dL (ref 6.4–8.3)

## 2017-09-19 NOTE — Telephone Encounter (Signed)
Lab brought over copy of latest lab results and pt in lobby with questions regarding how to take her [name] now based on lab results.  Per Dr Jana Hakim, he will see her in a room now to discuss labs and medication management.

## 2017-09-20 LAB — CANCER ANTIGEN 27.29: CA 27.29: 22.6 U/mL (ref 0.0–38.6)

## 2017-09-22 DIAGNOSIS — H401134 Primary open-angle glaucoma, bilateral, indeterminate stage: Secondary | ICD-10-CM | POA: Diagnosis not present

## 2017-09-22 DIAGNOSIS — Z961 Presence of intraocular lens: Secondary | ICD-10-CM | POA: Diagnosis not present

## 2017-09-27 ENCOUNTER — Telehealth: Payer: Self-pay | Admitting: *Deleted

## 2017-09-27 NOTE — Telephone Encounter (Signed)
"  I need my doctor's nurse about a refill."

## 2017-10-10 ENCOUNTER — Telehealth: Payer: Self-pay

## 2017-10-10 ENCOUNTER — Telehealth: Payer: Self-pay | Admitting: *Deleted

## 2017-10-10 ENCOUNTER — Other Ambulatory Visit: Payer: Self-pay | Admitting: Oncology

## 2017-10-10 DIAGNOSIS — Z170 Estrogen receptor positive status [ER+]: Secondary | ICD-10-CM

## 2017-10-10 DIAGNOSIS — C7800 Secondary malignant neoplasm of unspecified lung: Secondary | ICD-10-CM

## 2017-10-10 DIAGNOSIS — C50411 Malignant neoplasm of upper-outer quadrant of right female breast: Secondary | ICD-10-CM

## 2017-10-10 MED FILL — IBRANCE 75 MG CAPSULE: 75 | 28 days supply | Qty: 21 | Fill #2

## 2017-10-10 NOTE — Telephone Encounter (Signed)
Received VM from "Shawn" [facility] outpatient pharmacy regarding should pt's Ibrance prescription read "every other day" or remain at daily except for 7 day break.  Discussed with Dr Jana Hakim and he preferred me touch base with pt on what was decided per last visit.  Contacted pt and per her, this is her week off the [place] and when she has her blood work on this Tuesday, she will discuss with Dr Jana Hakim whether to be on daily or every other day by her WBC count.  Notified [facility] and he will make a note of the plan of care regarding Ibrance . (Pt would also like to make sure we have her mammogram results for her upcoming appt on Tues.- I told her I will keep an eye out for those results next Monday so they will be available to review. )

## 2017-10-10 NOTE — Telephone Encounter (Signed)
"  [name] with [place].  Calling for order and quantity clarification.  Patient reports changes in use.  Will need new order to reflect any change.  [phone]."

## 2017-10-13 ENCOUNTER — Ambulatory Visit
Admission: RE | Admit: 2017-10-13 | Discharge: 2017-10-13 | Disposition: A | Discharge status: Home / Self Care | Payer: Medicare Other | Source: Ambulatory Visit | Attending: Oncology | Admitting: Oncology

## 2017-10-13 DIAGNOSIS — Z170 Estrogen receptor positive status [ER+]: Secondary | ICD-10-CM

## 2017-10-13 DIAGNOSIS — C7800 Secondary malignant neoplasm of unspecified lung: Secondary | ICD-10-CM

## 2017-10-13 DIAGNOSIS — R922 Inconclusive mammogram: Secondary | ICD-10-CM | POA: Diagnosis not present

## 2017-10-13 DIAGNOSIS — C50411 Malignant neoplasm of upper-outer quadrant of right female breast: Secondary | ICD-10-CM

## 2017-10-13 HISTORY — DX: Personal history of irradiation: Z92.3

## 2017-10-16 NOTE — Progress Notes (Signed)
[place]  [phone] [phone]     ID: [name] DOB: 1945-11-16  MR#: [id number]  [facility]#:[id number]  Patient Care Team: Biagio Borg, MD as PCP - Stevan Born, MD as Consulting Physician (General Surgery) Magrinat, Virgie Dad, MD as Consulting Physician (Oncology) Kyung Rudd, MD as Consulting Physician (Radiation Oncology) Bobbye Charleston, MD as Consulting Physician (Obstetrics and Gynecology) Nada Libman, MD as Referring Physician (Specialist) Vevelyn Royals, MD as Consulting Physician (Ophthalmology) Lamonte Sakai Rose Fillers, MD as Consulting Physician (Pulmonary Disease) Alexis Frock, MD as Consulting Physician (Urology) OTHER MD:  CHIEF COMPLAINT: Estrogen receptor positive breast cancer  CURRENT TREATMENT: Letrozole, palbociclib   INTERVAL HISTORY: [name] returns today for follow-up and treatment of her estrogen receptor positive stage IV breast cancer accompanied by her husband. She continues on letrozole, with good tolerance. She denies issues with hot flashes or vaginal dryness.   She also receives palbociclib, currently at 75 mg every other day for the last 2 months, 21 days on and 7 days off. She is starting her 21 day cycle today, and she will begin taking it daily. She tolerates this well.   Since her last visit, she underwent diagnostic bilateral mammography with CAD and tomography on 10/13/2017 at [facility] showing: breast density category C. There was no evidence of malignancy.   We have just restaged her, with a chest CT obtained earlier today.  The definitive reading is still pending.   REVIEW OF SYSTEMS: [name] reports that she takes compazine and Pepto-bismol for nausea. Sometimes after she eats, she coughs so hard that she vomits. She has recurring mouth sores that are red and painful. She has pain in the right chest that extends to the right shoulder. This pain started 2 weeks ago and occurs in the  morning and at night. It is still in the morning and she takes tylenol to relieve this. The pain is not present during the day, but at night she places a heating pad on it. She ha not had physical therapy yet, but she does ROM exercises. Her husband has left knee replacement and is having difficulties from this.  She denies unusual headaches, visual changes, vomiting, or dizziness. There has been no unusual cough, phlegm production, or pleurisy. This been no change in bowel or bladder habits. She denies unexplained fatigue or unexplained weight loss, bleeding, rash, or fever. A detailed review of systems was otherwise stable.    BREAST CANCER HISTORY: From the original intake note:  [name] had screening mammography showing some suspicious calcifications in the right breast leading to right diagnostic mammography with ultrasonography 01/22/2016 at [facility]. The breast density was category C. In the upper right breast there was a 2.3 cm mass with additional masses measuring 0.9 and 0.7 cm. There was also a possible additional 0.8 mass in the lower inner quadrant. Ultrasound confirmed an irregular hypoechoic mass in the right breast upper outer quadrant measuring 2.0 cm. There were other masses measuring 0.7 and 0.8 cm by ultrasonography. The right axilla was sonographically benign.  Biopsy of a 12:00 and 4:00 mass in the right breast 01/22/2016 showed ([id number]) both specimens showing invasive ductal carcinoma, grade 1 or 2, both 95% estrogen receptor positive, both 95% progesterone receptor positive, both with strong staining intensity, with MIB-1 ranging from 10-15%, and both HER-2 negative, the signals ratio being 1.23-1.42, and the number per cell 1.85-2.59.  Her subsequent history is as detailed below    PAST MEDICAL HISTORY:  Past Medical History:  Diagnosis Date  . Breast cancer ([place])   . Cancer ([place]) 02/2016   right breast  . DIABETES MELLITUS, TYPE II 01/04/2007   only takes actoplus daily    . Dizziness and giddiness 02/29/2008  . DVT, HX OF    at age 12 in right buttocks  . Dyspnea    due to lung cancer  . Family history of breast cancer   . GERD 01/04/2007   pt reports resolved   . GLAUCOMA 07/30/2008   both eyes  . History of blood transfusion    no abnormal  reaction  . History of uterine cancer 2000   hysterectomy done  . HYPERLIPIDEMIA 01/04/2007   taking Pravastatin daily  . HYPERTENSION 01/04/2007   takes Lisinopril daily  . Joint pain   . Joint swelling   . Leg cramps   . LEG PAIN, LEFT 07/06/2007  . NUMBNESS 07/30/2008   in fingers;pt states from Diamox  . OSTEOARTHRITIS, HIP 09/25/2009  . OTITIS MEDIA, ACUTE, BILATERAL 02/29/2008  . Overweight(278.02) 01/04/2007  . Peripheral vascular disease ([place])   . Personal history of radiation therapy 2018  . Pneumonia   . PONV (postoperative nausea and vomiting)   . SLEEP APNEA, OBSTRUCTIVE    doesn't use a cpap;study done about 43yr ago  . TRANSIENT ISCHEMIC ATTACK, HX OF 01/04/2007  . Vision loss    left eye    PAST SURGICAL HISTORY: Past Surgical History:  Procedure Laterality Date  . ABDOMINAL HYSTERECTOMY  2000  . BREAST LUMPECTOMY Right 01/13/2017   x2  . BREAST LUMPECTOMY WITH RADIOACTIVE SEED AND SENTINEL LYMPH NODE BIOPSY Right 01/13/2017   Procedure: RIGHT BREAST RADIOACTIVE SEED X'S 2 GUIDED LUMPECTOMY WITH RADIOACTIVE SEED TARGETED AXILLARYLYMPH NODE EXCISION AND RIGHT AXILLARY SENTINEL LYMPH NODE BIOPSY;  Surgeon: NAlphonsa Overall MD;  Location[facility]  Service: General;  Laterality: Right;  2 SEEDS IN RIGHT BREAST 1 SEED IN RIGHT AXILLARY NODE  . CHOLECYSTECTOMY    . ENDOBRONCHIAL ULTRASOUND Bilateral 03/28/2016   Procedure: ENDOBRONCHIAL ULTRASOUND;  Surgeon: RCollene Gobble MD;  [facility];  Service: Cardiopulmonary;  Laterality: Bilateral;  . EYE SURGERY  13   shunt left and lazer eye surgery on right cataract and retenia tear with repair  . growth removal  2004   from thumb  . KNEE  ARTHROSCOPY Right   . mulitple eye surgeries     both eyes, cataracts with ioc done both eyes  . OOPHORECTOMY    . right lumpectomy with axillary node dissection Right 01/2017  . TOTAL HIP ARTHROPLASTY  06/24/2011   Procedure: TOTAL HIP ARTHROPLASTY;  Surgeon: FKerin Salen  [facility]  Service: Orthopedics;  Laterality: Right;  . TOTAL HIP ARTHROPLASTY Left 11/12/2012   Dr RMayer Camel . TOTAL HIP ARTHROPLASTY Left 11/12/2012   Procedure: TOTAL HIP ARTHROPLASTY;  Surgeon: FKerin Salen MD;  [facility]  Service: Orthopedics;  Laterality: Left;  DEPUY PINNACLE    FAMILY HISTORY Family History  Problem Relation Age of Onset  . Dementia Mother   . Cancer Mother        Breast and lung cancer  . Stroke Sister   . Breast cancer Sister 551 . Heart attack Maternal Aunt   . Lung cancer Maternal Grandmother        non smoker  . Glaucoma Maternal Grandfather   . Anesthesia problems Neg Hx   The patient's father died at age 213 the patient's mother  died at age 28. She had breast and lung cancers diagnosed shortly before her death. The patient had no brothers, 2 sisters. One sister was diagnosed with breast cancer at the age of 52.  GYNECOLOGIC HISTORY:  No LMP recorded. Patient has had a hysterectomy. Menarche age 59, first live birth age 36, the patient is GX P1. She had a hysterectomy for endometrial cancer in the year 2000. She did not take hormone replacement. She did use oral contraceptives for more than 20 years remotely, with no complications.  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 She is home with her husband [name]. He is a retired Dealer.Their son [name] also lives in [place].    ADVANCED DIRECTIVES: In place  HEALTH MAINTENANCE: Social History   Tobacco Use  . Smoking status: Never Smoker  . Smokeless tobacco: Never Used  Substance Use Topics  . Alcohol use: No  . Drug use:  No     Colonoscopy: Never  PAP: Status post hysterectomy  Bone density: Remote   Allergies  Allergen Reactions  . Codeine Hives    Hycodan syrup  . Fluorescein Nausea And Vomiting    ? IV dye for retina specialist  . Lipitor [Atorvastatin Calcium]     Leg cramp  . Oxycodone Nausea And Vomiting    Patient vomited for 3 days after taking  . Sitagliptin Phosphate Nausea And Vomiting  . Sulfa Drugs Cross Reactors Nausea And Vomiting    Current Outpatient Medications  Medication Sig Dispense Refill  . acetaminophen (TYLENOL) 500 MG tablet Take 1,000 mg by mouth every 4 (four) hours as needed for moderate pain or fever.    Marland Kitchen aspirin EC 81 MG tablet Take 81 mg by mouth daily at 6 PM. 1700    . bimatoprost (LUMIGAN) 0.01 % SOLN Place 1 drop into both eyes at bedtime.    . brimonidine-timolol (COMBIGAN) 0.2-0.5 % ophthalmic solution Place 1 drop into both eyes three times daily    . cholecalciferol (VITAMIN D) 1000 units tablet Take 1,000 Units by mouth daily.    . ciprofloxacin (CIPRO) 500 MG tablet Take 1 tablet (500 mg total) by mouth 2 (two) times daily. 10 tablet 0  . dorzolamide-timolol (COSOPT) 22.3-6.8 MG/ML ophthalmic solution Place 1 drop into both eyes 2 (two) times daily.    Marland Kitchen letrozole (FEMARA) 2.5 MG tablet Take 1 tablet (2.5 mg total) by mouth at bedtime. 90 tablet 3  . lisinopril (PRINIVIL,ZESTRIL) 20 MG tablet Take 1 tablet (20 mg total) by mouth daily. 90 tablet 3  . lovastatin (MEVACOR) 20 MG tablet Take 1 tablet (20 mg total) by mouth every evening. 90 tablet 3  . palbociclib (IBRANCE) 75 MG capsule Take 1 capsule (75 mg total) by mouth daily with breakfast. Take whole with food. 21 capsule 6  . pioglitazone-metformin (ACTOPLUS MET) 15-500 MG tablet Take 1 tablet by mouth daily. 90 tablet 3  . prochlorperazine (COMPAZINE) 10 MG tablet Take 1 tablet (10 mg total) by mouth every 6 (six) hours as needed for nausea or vomiting. (Patient not taking: Reported on 08/08/2017) 30  tablet 0   No current facility-administered medications for this visit.      OBJECTIVE: Middle-aged white woman who appears stated age  72:   10/17/17 1023  BP: (!) 145/67  Pulse: 79  Resp: 18  Temp: 98.6 F (37 C)  SpO2: 98%     Body  mass index is 40.23 kg/m.    ECOG FS:1 - Symptomatic but completely ambulatory Filed Weights   10/17/17 1023  Weight: 234 lb 6.4 oz (106.3 kg)   Sclerae unicteric, pupils round and equal No cervical or supraclavicular adenopathy Lungs no rales or rhonchi Heart regular rate and rhythm Abd soft, obese, nontender, positive bowel sounds MSK no focal spinal tenderness, no upper extremity lymphedema Neuro: nonfocal, well oriented, appropriate affect Breasts: The right breast is status post lumpectomy and radiation.  There are the expected postsurgical and postradiation changes, but no evidence of disease recurrence.  The left breast is benign.  Both axillae are benign.  LAB RESULTS:  CMP     Component Value Date/Time   NA 142 09/19/2017 1045   NA 140 05/29/2017 1254   K 4.5 09/19/2017 1045   K 4.4 05/29/2017 1254   CL 107 09/19/2017 1045   CO2 25 09/19/2017 1045   CO2 25 05/29/2017 1254   GLUCOSE 225 (H) 09/19/2017 1045   GLUCOSE 154 (H) 05/29/2017 1254   BUN 15 09/19/2017 1045   BUN 11.5 05/29/2017 1254   CREATININE 0.96 09/19/2017 1045   CREATININE 0.8 05/29/2017 1254   CALCIUM 9.4 09/19/2017 1045   CALCIUM 9.3 05/29/2017 1254   PROT 6.7 09/19/2017 1045   PROT 7.0 05/29/2017 1254   ALBUMIN 3.7 09/19/2017 1045   ALBUMIN 4.1 05/29/2017 1254   AST 13 09/19/2017 1045   AST 13 05/29/2017 1254   ALT 15 09/19/2017 1045   ALT 14 05/29/2017 1254   ALKPHOS 67 09/19/2017 1045   ALKPHOS 66 05/29/2017 1254   BILITOT 0.4 09/19/2017 1045   BILITOT 0.50 05/29/2017 1254   GFRNONAA 58 (L) 09/19/2017 1045   GFRAA >60 09/19/2017 1045    INo results found for: SPEP, UPEP  Lab Results  Component Value Date   WBC 2.9 (L) 10/17/2017    NEUTROABS 1.8 10/17/2017   HGB 12.1 10/17/2017   HCT 35.7 10/17/2017   MCV 90.8 10/17/2017   PLT 138 (L) 10/17/2017      Chemistry      Component Value Date/Time   NA 142 09/19/2017 1045   NA 140 05/29/2017 1254   K 4.5 09/19/2017 1045   K 4.4 05/29/2017 1254   CL 107 09/19/2017 1045   CO2 25 09/19/2017 1045   CO2 25 05/29/2017 1254   BUN 15 09/19/2017 1045   BUN 11.5 05/29/2017 1254   CREATININE 0.96 09/19/2017 1045   CREATININE 0.8 05/29/2017 1254      Component Value Date/Time   CALCIUM 9.4 09/19/2017 1045   CALCIUM 9.3 05/29/2017 1254   ALKPHOS 67 09/19/2017 1045   ALKPHOS 66 05/29/2017 1254   AST 13 09/19/2017 1045   AST 13 05/29/2017 1254   ALT 15 09/19/2017 1045   ALT 14 05/29/2017 1254   BILITOT 0.4 09/19/2017 1045   BILITOT 0.50 05/29/2017 1254       No results found for: LABCA2  No components found for: LABCA125  No results for input(s): INR in the last 168 hours.  Urinalysis    Component Value Date/Time   COLORURINE YELLOW 08/18/2017 1322   APPEARANCEUR HAZY (A) 08/18/2017 1322   LABSPEC 1.010 08/18/2017 1322   PHURINE 7.0 08/18/2017 1322   GLUCOSEU NEGATIVE 08/18/2017 1322   GLUCOSEU NEGATIVE 10/30/2014 1513   HGBUR LARGE (A) 08/18/2017 1322   BILIRUBINUR NEGATIVE 08/18/2017 1322   KETONESUR NEGATIVE 08/18/2017 1322   PROTEINUR 30 (A) 08/18/2017 1322   UROBILINOGEN 0.2 10/30/2014  [place] 08/18/2017 1322   LEUKOCYTESUR LARGE (A) 08/18/2017 1322   Most recent CA-27-29 remains in the normal range.  This is being repeated today.  STUDIES: Mm Diag Breast Tomo Bilateral  Result Date: 10/13/2017 CLINICAL DATA:  Patient underwent 2 right breast lumpectomies both for breast carcinoma in August 2018. Patient also a kidney mass and a history of lung carcinoma. Patient has a family history of breast carcinoma in her mother and sister. EXAM: DIGITAL DIAGNOSTIC BILATERAL MAMMOGRAM WITH CAD AND TOMO COMPARISON:  Previous exam(s). ACR Breast  Density Category c: The breast tissue is heterogeneously dense, which may obscure small masses. FINDINGS: There is focal opacity with distortion the right breast which reflects the lumpectomy scars. There are no discrete masses or other areas of architectural distortion. There are no new or suspicious calcifications. Mammographic images were processed with CAD. IMPRESSION: 1. No evidence recurrent or new breast malignancy. 2. Postsurgical changes on the right. RECOMMENDATION: 1. Diagnostic mammography in 1 year per standard post lumpectomy protocol. 2. Consider annual breast MRI given the patient's elevated cancer risk based on a personal and family history. I have discussed the findings and recommendations with the patient. Results were also provided in writing at the conclusion of the visit. If applicable, a reminder letter will be sent to the patient regarding the next appointment. BI-RADS CATEGORY  2: Benign. Electronically Signed   By: Lajean Manes M.D.   On: 10/13/2017 12:16    ELIGIBLE FOR AVAILABLE RESEARCH PROTOCOL: no  ASSESSMENT: 72 y.o. [place] woman with a remote history of early stage endometrial cancer, now status post right breast upper outer quadrant biopsy 01/22/2016 for a clinically multifocal T2 N0, stage 2A invasive ductal carcinoma, grade 1, estrogen and progesterone receptor positive, HER-2 negative, with an MIB-1 between 10 and 15%.  (1) right axillary lymph node biopsy 03/02/2016 positive  (2) genetics testing 01/13/2016 through the Custom gene panel offered by [facility] found no deleterious mutations in  ATM, BARD1, BRCA1, BRCA2, BRIP1, CDH1, CHEK2, EPCAM, FANCC, MLH1, MSH2, MSH6, MUTYH, NBN, PALB2, PMS2, POLD1, PTEN, RAD51C, RAD51D, TP53, and XRCC2  METASTATIC DISEASE: OCT 2017 (3) CT scans of the chest abdomen and pelvis obtained 03/10/2016 are consistent with bilateral lung metastases and mediastinal and hilar nodal involvement, but no liver or bone spread  (a)  bronchoscopic lymph node biopsy 2 (station 7, 13R) 03/28/2016 confirms metastatic adenocarcinoma, estrogen receptor positive, HER-2 not amplified  (b) baseline CA-27-29 on 04/18/2016 was 137.5.  (4) letrozole started 03/15/2016, palbociclib added 03/29/2016 at 125 mg/day, 21/7  (a) dose decreased to 100 mg per day, 21/7, beginning with February cycle  (b) palbociclib held 01/31/2017, with increasing symptoms  (c) palbociclib resumed October 2018 at 75 mg daily  (d) palbociclib dose reduced to 75 mg every other day February through April 2019  (e) palbociclib dose resumed at 75 mg daily as of 10/17/2017  (5) status post double right lumpectomies and right axillary lymph node sampling 01/13/2017 for 2 separate invasive ductal carcinoma lesions, pT1a and pT1b, N1a, with negative margins, both lesions being estrogen and progesterone receptor positive and HER-2 negative  (6) adjuvant radiation completed 07/05/2017 1. 50.4 Gy in 28 fractions to the right breast and supraclavicular region using whole-breast tangent fields. 2. Boost to the seroma delivered an additional 10 Gy in 5 fractions. The total dose was 60.4 Gy.  (7) restaging studies:  (a) CT scan of the chest and bone scan 06/23/2017 showed stable scattered very small lung  nodules, no bone lesions  (b) CT of the chest 10/17/2017   (8) right upper pole renal lesion noted to be enlarging on CT scan 06/22/2017   PLAN: [name] is now done with surgery and radiation and is ready to resume her palbociclib at her prior dose of 75 mg daily.  She tolerates this remarkably well as she also tolerates the letrozole.  We are waiting on results of her CT scan today.  I am hopeful that it will show at least good disease control.  At this point aside from the spots in the lung we are not aware of any other area of disease.  She went through a mild depression.  She got herself out of it.  I suggested that she become more active and do more activity as she  goes through the better she will feel.  Similarly she is somewhat deconditioned and really needs to get into better shape.  At this point I am comfortable starting to see her on an every other month basis.  She will have lab work in 4 weeks and I will see her again in 8 weeks.  Sometime this fall she will be restaged with a PET scan.  That will allow [place] to also follow the right renal lesion in case that requires more attention  She knows to call for any other issues that may develop before the next visit.    Magrinat, Virgie Dad, MD  10/17/17 10:44 AM Medical Oncology and Hematology [facility] [address] [place], [place] [address] Tel. [phone]    Fax. [phone]  This document serves as a record of services personally performed by Lurline Del, MD. It was created on his behalf by Sheron Nightingale, a trained medical scribe. The creation of this record is based on the scribe's personal observations and the provider's statements to them.   I have reviewed the above documentation for accuracy and completeness, and I agree with the above.

## 2017-10-17 ENCOUNTER — Other Ambulatory Visit: Payer: Self-pay | Admitting: Oncology

## 2017-10-17 ENCOUNTER — Ambulatory Visit (HOSPITAL_COMMUNITY)
Admission: RE | Admit: 2017-10-17 | Discharge: 2017-10-17 | Disposition: A | Discharge status: Home / Self Care | Payer: Medicare Other | Source: Ambulatory Visit | Attending: Oncology | Admitting: Oncology

## 2017-10-17 ENCOUNTER — Telehealth: Payer: Self-pay | Admitting: Oncology

## 2017-10-17 ENCOUNTER — Inpatient Hospital Stay (HOSPITAL_BASED_OUTPATIENT_CLINIC_OR_DEPARTMENT_OTHER): Payer: Medicare Other | Admitting: Oncology

## 2017-10-17 ENCOUNTER — Encounter (HOSPITAL_COMMUNITY): Payer: Self-pay

## 2017-10-17 ENCOUNTER — Inpatient Hospital Stay: Payer: Medicare Other | Attending: Oncology

## 2017-10-17 ENCOUNTER — Telehealth: Payer: Self-pay

## 2017-10-17 VITALS — BP 145/67 | HR 79 | Temp 98.6°F | Resp 18 | Ht 64.0 in | Wt 234.4 lb

## 2017-10-17 DIAGNOSIS — C773 Secondary and unspecified malignant neoplasm of axilla and upper limb lymph nodes: Secondary | ICD-10-CM | POA: Diagnosis not present

## 2017-10-17 DIAGNOSIS — Z170 Estrogen receptor positive status [ER+]: Secondary | ICD-10-CM

## 2017-10-17 DIAGNOSIS — Z923 Personal history of irradiation: Secondary | ICD-10-CM | POA: Diagnosis not present

## 2017-10-17 DIAGNOSIS — R911 Solitary pulmonary nodule: Secondary | ICD-10-CM | POA: Insufficient documentation

## 2017-10-17 DIAGNOSIS — R110 Nausea: Secondary | ICD-10-CM | POA: Insufficient documentation

## 2017-10-17 DIAGNOSIS — R05 Cough: Secondary | ICD-10-CM | POA: Diagnosis not present

## 2017-10-17 DIAGNOSIS — I739 Peripheral vascular disease, unspecified: Secondary | ICD-10-CM | POA: Insufficient documentation

## 2017-10-17 DIAGNOSIS — Z8542 Personal history of malignant neoplasm of other parts of uterus: Secondary | ICD-10-CM | POA: Diagnosis not present

## 2017-10-17 DIAGNOSIS — C7800 Secondary malignant neoplasm of unspecified lung: Secondary | ICD-10-CM

## 2017-10-17 DIAGNOSIS — Z7982 Long term (current) use of aspirin: Secondary | ICD-10-CM | POA: Insufficient documentation

## 2017-10-17 DIAGNOSIS — I700 Atherosclerosis of aorta: Secondary | ICD-10-CM | POA: Insufficient documentation

## 2017-10-17 DIAGNOSIS — N2889 Other specified disorders of kidney and ureter: Secondary | ICD-10-CM | POA: Diagnosis not present

## 2017-10-17 DIAGNOSIS — E119 Type 2 diabetes mellitus without complications: Secondary | ICD-10-CM | POA: Insufficient documentation

## 2017-10-17 DIAGNOSIS — Z79899 Other long term (current) drug therapy: Secondary | ICD-10-CM | POA: Diagnosis not present

## 2017-10-17 DIAGNOSIS — Z801 Family history of malignant neoplasm of trachea, bronchus and lung: Secondary | ICD-10-CM | POA: Insufficient documentation

## 2017-10-17 DIAGNOSIS — Z79811 Long term (current) use of aromatase inhibitors: Secondary | ICD-10-CM | POA: Diagnosis not present

## 2017-10-17 DIAGNOSIS — C7801 Secondary malignant neoplasm of right lung: Secondary | ICD-10-CM

## 2017-10-17 DIAGNOSIS — K760 Fatty (change of) liver, not elsewhere classified: Secondary | ICD-10-CM

## 2017-10-17 DIAGNOSIS — M1990 Unspecified osteoarthritis, unspecified site: Secondary | ICD-10-CM | POA: Diagnosis not present

## 2017-10-17 DIAGNOSIS — C50411 Malignant neoplasm of upper-outer quadrant of right female breast: Secondary | ICD-10-CM

## 2017-10-17 DIAGNOSIS — N6489 Other specified disorders of breast: Secondary | ICD-10-CM | POA: Insufficient documentation

## 2017-10-17 DIAGNOSIS — C50911 Malignant neoplasm of unspecified site of right female breast: Secondary | ICD-10-CM | POA: Diagnosis not present

## 2017-10-17 DIAGNOSIS — Z803 Family history of malignant neoplasm of breast: Secondary | ICD-10-CM | POA: Insufficient documentation

## 2017-10-17 DIAGNOSIS — I10 Essential (primary) hypertension: Secondary | ICD-10-CM | POA: Diagnosis not present

## 2017-10-17 DIAGNOSIS — Z7984 Long term (current) use of oral hypoglycemic drugs: Secondary | ICD-10-CM | POA: Insufficient documentation

## 2017-10-17 DIAGNOSIS — E785 Hyperlipidemia, unspecified: Secondary | ICD-10-CM | POA: Diagnosis not present

## 2017-10-17 DIAGNOSIS — R079 Chest pain, unspecified: Secondary | ICD-10-CM | POA: Diagnosis not present

## 2017-10-17 LAB — COMPREHENSIVE METABOLIC PANEL
ALT: 18 U/L (ref 0–55)
AST: 16 U/L (ref 5–34)
Albumin: 4 g/dL (ref 3.5–5.0)
Alkaline Phosphatase: 66 U/L (ref 40–150)
Anion gap: 8 (ref 3–11)
BUN: 12 mg/dL (ref 7–26)
CO2: 24 mmol/L (ref 22–29)
Calcium: 9.2 mg/dL (ref 8.4–10.4)
Chloride: 107 mmol/L (ref 98–109)
Creatinine, Ser: 0.92 mg/dL (ref 0.60–1.10)
GFR calc Af Amer: >60 mL/min (ref >60)
GFR calc non Af Amer: >60 mL/min (ref >60)
Glucose, Bld: 180 mg/dL — ABNORMAL HIGH (ref 70–140)
Potassium: 4.2 mmol/L (ref 3.5–5.1)
Sodium: 139 mmol/L (ref 136–145)
Total Bilirubin: 0.5 mg/dL (ref 0.2–1.2)
Total Protein: 6.9 g/dL (ref 6.4–8.3)

## 2017-10-17 LAB — CBC WITH DIFFERENTIAL/PLATELET
Basophils Absolute: 0 10*3/uL (ref 0.0–0.1)
Basophils Relative: 0 %
Eosinophils Absolute: 0.1 10*3/uL (ref 0.0–0.5)
Eosinophils Relative: 2 %
HCT: 35.7 % (ref 34.8–46.6)
Hemoglobin: 12.1 g/dL (ref 11.6–15.9)
Lymphocytes Relative: 27 %
Lymphs Abs: 0.8 10*3/uL — ABNORMAL LOW (ref 0.9–3.3)
MCH: 30.8 pg (ref 25.1–34.0)
MCHC: 33.9 g/dL (ref 31.5–36.0)
MCV: 90.8 fL (ref 79.5–101.0)
Monocytes Absolute: 0.3 10*3/uL (ref 0.1–0.9)
Monocytes Relative: 10 %
Neutro Abs: 1.8 10*3/uL (ref 1.5–6.5)
Neutrophils Relative %: 61 %
Platelets: 138 10*3/uL — ABNORMAL LOW (ref 145–400)
RBC: 3.93 MIL/uL (ref 3.70–5.45)
RDW: 14.3 % (ref 11.2–14.5)
WBC: 2.9 10*3/uL — ABNORMAL LOW (ref 3.9–10.3)

## 2017-10-17 MED ORDER — IOPAMIDOL (ISOVUE-300) INJECTION 61%
75.0000 mL | Freq: Once | INTRAVENOUS | Status: AC | PRN
  Administered 2017-10-17: 75 mL via INTRAVENOUS

## 2017-10-17 MED ORDER — VALACYCLOVIR HCL 1 G PO TABS: 1000.0000 mg | ORAL_TABLET | Freq: Every day | ORAL | 0 refills | Status: DC

## 2017-10-17 NOTE — Telephone Encounter (Signed)
Per Dr Jana Hakim, I called pt with her recent CT chest results - per him no new spots, area at her right kidney really hasn't changed, he is pleased.  Pt asked about her lungs, per report- no new or progressive metastatic disease in the chest. Pt voiced understanding and no other needs at this time per pt.

## 2017-10-17 NOTE — Telephone Encounter (Signed)
Gave patient AVs and calendar of upcoming June through November appointments

## 2017-10-18 LAB — CANCER ANTIGEN 27.29: CA 27.29: 23.5 U/mL (ref 0.0–38.6)

## 2017-11-07 ENCOUNTER — Ambulatory Visit: Payer: Medicare Other | Admitting: Rehabilitation

## 2017-11-07 ENCOUNTER — Ambulatory Visit: Payer: Medicare Other | Attending: Oncology | Admitting: Rehabilitation

## 2017-11-07 DIAGNOSIS — Z483 Aftercare following surgery for neoplasm: Secondary | ICD-10-CM | POA: Diagnosis not present

## 2017-11-07 DIAGNOSIS — R293 Abnormal posture: Secondary | ICD-10-CM | POA: Diagnosis not present

## 2017-11-07 DIAGNOSIS — M25511 Pain in right shoulder: Secondary | ICD-10-CM | POA: Insufficient documentation

## 2017-11-07 DIAGNOSIS — M25611 Stiffness of right shoulder, not elsewhere classified: Secondary | ICD-10-CM | POA: Diagnosis not present

## 2017-11-07 NOTE — Therapy (Signed)
[place], [place], [address] Phone: [phone]   Fax:  [phone]  Physical Therapy Evaluation  Patient Details  Name: [name] MRN: [id number] Date of Birth: [birth year] Referring Provider: Lurline Del, MD   Encounter Date: 11/07/2017  PT End of Session - 11/07/17 2110    Visit Number  1    Number of Visits  4    Date for PT Re-Evaluation  12/12/17 unable to start until next week    PT Start Time  1100    PT Stop Time  1200    PT Time Calculation (min)  60 min    Activity Tolerance  Patient tolerated treatment well    Behavior During Therapy  [facility] for tasks assessed/performed       Past Medical History:  Diagnosis Date  . Breast cancer ([place])   . Cancer ([place]) 02/2016   right breast  . DIABETES MELLITUS, TYPE II 01/04/2007   only takes actoplus daily  . Dizziness and giddiness 02/29/2008  . DVT, HX OF    at age 24 in right buttocks  . Dyspnea    due to lung cancer  . Family history of breast cancer   . GERD 01/04/2007   pt reports resolved   . GLAUCOMA 07/30/2008   both eyes  . History of blood transfusion    no abnormal  reaction  . History of uterine cancer 2000   hysterectomy done  . HYPERLIPIDEMIA 01/04/2007   taking Pravastatin daily  . HYPERTENSION 01/04/2007   takes Lisinopril daily  . Joint pain   . Joint swelling   . Leg cramps   . LEG PAIN, LEFT 07/06/2007  . NUMBNESS 07/30/2008   in fingers;pt states from Diamox  . OSTEOARTHRITIS, HIP 09/25/2009  . OTITIS MEDIA, ACUTE, BILATERAL 02/29/2008  . Overweight(278.02) 01/04/2007  . Peripheral vascular disease ([place])   . Personal history of radiation therapy 2018  . Pneumonia   . PONV (postoperative nausea and vomiting)   . SLEEP APNEA, OBSTRUCTIVE    doesn't use a cpap;study done about 75yr ago  . TRANSIENT ISCHEMIC ATTACK, HX OF 01/04/2007  . Vision loss    left eye    Past Surgical History:  Procedure Laterality  Date  . ABDOMINAL HYSTERECTOMY  2000  . BREAST LUMPECTOMY Right 01/13/2017   x2  . BREAST LUMPECTOMY WITH RADIOACTIVE SEED AND SENTINEL LYMPH NODE BIOPSY Right 01/13/2017   Procedure: RIGHT BREAST RADIOACTIVE SEED X'S 2 GUIDED LUMPECTOMY WITH RADIOACTIVE SEED TARGETED AXILLARYLYMPH NODE EXCISION AND RIGHT AXILLARY SENTINEL LYMPH NODE BIOPSY;  Surgeon: NAlphonsa Overall MD;  Location: [facility]  Service: General;  Laterality: Right;  2 SEEDS IN RIGHT BREAST 1 SEED IN RIGHT AXILLARY NODE  . CHOLECYSTECTOMY    . ENDOBRONCHIAL ULTRASOUND Bilateral 03/28/2016   Procedure: ENDOBRONCHIAL ULTRASOUND;  Surgeon: RCollene Gobble MD;  Location: [facility];  Service: Cardiopulmonary;  Laterality: Bilateral;  . EYE SURGERY  13   shunt left and lazer eye surgery on right cataract and retenia tear with repair  . growth removal  2004   from thumb  . KNEE ARTHROSCOPY Right   . mulitple eye surgeries     both eyes, cataracts with ioc done both eyes  . OOPHORECTOMY    . right lumpectomy with axillary node dissection Right 01/2017  . TOTAL HIP ARTHROPLASTY  06/24/2011   Procedure: TOTAL HIP ARTHROPLASTY;  Surgeon: FKerin Salen  Location[facility]  Service: Orthopedics;  Laterality:  Right;  Marland Kitchen TOTAL HIP ARTHROPLASTY Left 11/12/2012   Dr Mayer Camel  . TOTAL HIP ARTHROPLASTY Left 11/12/2012   Procedure: TOTAL HIP ARTHROPLASTY;  Surgeon: Kerin Salen, MD;  Location: [facility];  Service: Orthopedics;  Laterality: Left;  DEPUY PINNACLE    There were no vitals filed for this visit.   Subjective Assessment - 11/07/17 1107    Subjective  Pt presents with complaints of Rt UE pain with use about 6 weeks ago for no known reason.  It is bad in the morning and at night and with certain movements.     Patient is accompained by:  Family member    Pertinent History  status post double right lumpectomies and right axillary lymph node sampling 01/13/2017 for 2 separate invasive ductal carcinoma lesions, pT1a and pT1b, N1a, with negative  margins, both lesions being estrogen and progesterone receptor positive and HER-2 negative. CT scans of the chest abdomen and pelvis obtained 03/10/2016 are consistent with bilateral lung metastases and mediastinal and hilar nodal involvement.  Currently taking letrozole and palbociclib with the later 21 days on/7 days off. Radiation completed 07/05/17.  Uterine cancer with hysterectomy 2000, L THR 2014, R THR 2013    Limitations  Lifting    Patient Stated Goals  decrease the pain in the morning and evening    Currently in Pain?  Yes    Pain Score  2     Pain Location  Shoulder    Pain Orientation  Right    Pain Descriptors / Indicators  Aching    Pain Radiating Towards  down the arm to the elbow    Pain Onset  More than a month ago    Pain Frequency  Constant    Aggravating Factors   reaching overhead, lowering the arm, donning a bra    Pain Relieving Factors  tylenol    Effect of Pain on Daily Activities  difficulty dressing,          [facility] PT Assessment - 11/07/17 0001      Assessment   Medical Diagnosis  Rt breast cancer    Referring Provider  Lurline Del, MD    Onset Date/Surgical Date  01/13/17    Hand Dominance  -- reports can use both    Prior Therapy  no      Precautions   Precaution Comments  cancer      Restrictions   Weight Bearing Restrictions  No      Balance Screen   Has the patient fallen in the past 6 months  No    Has the patient had a decrease in activity level because of a fear of falling?   No    Is the patient reluctant to leave their home because of a fear of falling?   No      Home Film/video editor residence    Living Arrangements  Spouse/significant other    Available Help at Discharge  Family      Prior Function   Level of [place]  Retired    Leisure  playing with grandkids      Cognition   Overall Cognitive Status  Within Functional Limits for tasks assessed      Observation/Other  Assessments   Observations  3 well healed incisions with Rt breast radiation changes present       Posture/Postural Control   Posture/Postural Control  Postural limitations    Postural Limitations  Rounded Shoulders;Forward head;Increased thoracic kyphosis      ROM / Strength   AROM / PROM / Strength  AROM;PROM;Strength      AROM   Overall AROM Comments  pain with all Rt shoulder movements     AROM Assessment Site  Shoulder    Right/Left Shoulder  Right;Left    Right Shoulder Flexion  150 Degrees    Right Shoulder ABduction  170 Degrees    Right Shoulder Internal Rotation  60 Degrees    Right Shoulder External Rotation  60 Degrees    Right Shoulder Horizontal ABduction  35 Degrees    Left Shoulder Flexion  160 Degrees    Left Shoulder ABduction  170 Degrees    Left Shoulder Internal Rotation  70 Degrees    Left Shoulder External Rotation  80 Degrees    Left Shoulder Horizontal ABduction  40 Degrees      PROM   Overall PROM Comments  all with pain end range    PROM Assessment Site  Shoulder    Right/Left Shoulder  Right;Left    Right Shoulder Flexion  150 Degrees    Right Shoulder ABduction  160 Degrees    Right Shoulder External Rotation  70 Degrees      Strength   Strength Assessment Site  Shoulder    Right/Left Shoulder  Right;Left    Right Shoulder Flexion  4/5    Right Shoulder ABduction  4/5    Right Shoulder Internal Rotation  4-/5    Right Shoulder External Rotation  3+/5    Left Shoulder Flexion  4/5    Left Shoulder ABduction  4/5    Left Shoulder Internal Rotation  4-/5    Left Shoulder External Rotation  4-/5      Flexibility   Soft Tissue Assessment /Muscle Length  yes      Palpation   Palpation comment  2 ttp subacromial space / supraspinatus tendon insertion; Rt breast fibrosis under both lumpectomy incisions with radiation skin changes present        LYMPHEDEMA/ONCOLOGY QUESTIONNAIRE - 11/07/17 1119      Type   Cancer Type  Right breast cancer       Surgeries   Mastectomy Date  01/13/17    Sentinel Lymph Node Biopsy Date  01/13/17    Number Lymph Nodes Removed  -- unknown      Treatment   Active Chemotherapy Treatment  No    Past Chemotherapy Treatment  No    Active Radiation Treatment  No    Past Radiation Treatment  Yes    Date  07/05/17    Current Hormone Treatment  Yes    Drug Name  letrozole      What other symptoms do you have   Are you Having Heaviness or Tightness  -- reports "maybe"    Are you having Pain  Yes    Are you having pitting edema  No    Is it Hard or Difficult finding clothes that fit  No    Do you have infections  No      Lymphedema Assessments   Lymphedema Assessments  Upper extremities      Right Upper Extremity Lymphedema   15 cm Proximal to Olecranon Process  42.3 cm    10 cm Proximal to Olecranon Process  41 cm    Olecranon Process  31.5 cm    10 cm Proximal to Ulnar Styloid Process  26.3 cm    Just Proximal  to Ulnar Styloid Process  21 cm    Across Hand at [employer]  20.5 cm    At [place] of 2nd Digit  7 cm      Left Upper Extremity Lymphedema   10 cm Proximal to Olecranon Process  41 cm    Olecranon Process  32 cm    10 cm Proximal to Ulnar Styloid Process  27 cm    Just Proximal to Ulnar Styloid Process  19.5 cm    Across Hand at [employer]  19.5 cm    At [place] of 2nd Digit  7 cm          Quick Dash - 11/07/17 0001    Open a tight or new jar  Moderate difficulty    Do heavy household chores (wash walls, wash floors)  No difficulty    Carry a shopping bag or briefcase  Moderate difficulty    Wash your back  Unable    Use a knife to cut food  Moderate difficulty    Recreational activities in which you take some force or impact through your arm, shoulder, or hand (golf, hammering, tennis)  No difficulty    During the past week, to what extent has your arm, shoulder or hand problem interfered with your normal social activities with family, friends, neighbors, or groups?   Not at all    During the past week, to what extent has your arm, shoulder or hand problem limited your work or other regular daily activities  Modererately    Arm, shoulder, or hand pain.  Moderate    Tingling (pins and needles) in your arm, shoulder, or hand  Mild    Difficulty Sleeping  Moderate difficulty    DASH Score  38.64 %        Objective measurements completed on examination: See above findings.              PT Education - 11/07/17 2109    Education provided  Yes    Education Details  Diagnosis, POC, initial HEP    Person(s) Educated  Patient;Spouse    Methods  Explanation;Demonstration;Tactile cues;Verbal cues;Handout    Comprehension  Verbalized understanding;Returned demonstration;Need further instruction          PT Long Term Goals - 11/07/17 2124      PT LONG TERM GOAL #1   Title  Pt will be educated on lymphedema resources if needed/interested in pursuing compression in the future    Time  5    Period  Weeks    Status  New    Target Date  12/12/17      PT LONG TERM GOAL #2   Title  Pt will decrease Rt shoulder pain to intermittent only    Baseline  2 at rest    Time  5    Period  Weeks    Status  New    Target Date  12/12/17      PT LONG TERM GOAL #3   Title  Pt will be aware of correct postural changes to make without cueing needed    Baseline  poor posture    Time  5    Period  Weeks    Status  New    Target Date  12/12/17      PT LONG TERM GOAL #4   Title  Pt will be independent with final HEP for continued shoulder strength    Time  5    Period  Weeks    Status  New    Target Date  12/12/17             Plan - 11/07/17 2111    Clinical Impression Statement  Pt presents post Rt breast lumpectomy x 2 on 01/13/17 with ER/PR positive and HER2 negative breast cancer with metaseses to the lungs, completed radiation, and current letrozole with palbociclib with Rt shoulder pain of new onset x 6 weeks.  Pain is consistent with signs  and symptoms of subacromial bursitis / rotator cuff impingement due to postural changes, radiation and surgical healing effects.  Pain is present with all AROM but with good overall AROM/PROM.  Strength is limited overall especially into ER.  Pt does have some fibrosis/hardening of the breast tissue under the lumpectomy incisions with reports of swelling in the breast but she seems to want to leave that alone for now.  Circumferential measurements are most likely equal between sides without significant changes from baseline.  Pt was educated on use of compression prophylactically but she declines at this time if she does not need it.      History and Personal Factors relevant to plan of care:  Rt breast cancer x 2 with lumpectomy and SLNB, lung metasteses, uterine cancer with hysterectomy, bil THR, compeleted radiation    Clinical Presentation  Evolving    Clinical Presentation due to:  new onset of shoulder pain, continued hormonal therapy    Clinical Decision Making  Moderate    Rehab Potential  Good    Clinical Impairments Affecting Rehab Potential  wanting to only do a few visits    PT Frequency  -- 3-4 visits    PT Duration  4 weeks    PT Treatment/Interventions  Iontophoresis '4mg'$ /ml Dexamethasone;Therapeutic exercise;Manual techniques;Manual lymph drainage    PT Next Visit Plan  begin Rt shoulder ROM and strength, scapular strengthening    PT Home Exercise Plan  [email or web address]     Consulted and Agree with Plan of Care  Patient       Patient will benefit from skilled therapeutic intervention in order to improve the following deficits and impairments:  Decreased range of motion, Decreased activity tolerance, Decreased strength, Impaired UE functional use, Postural dysfunction  Visit Diagnosis: Aftercare following surgery for neoplasm - Plan: PT plan of care cert/re-cert  Acute pain of right shoulder - Plan: PT plan of care cert/re-cert  Stiffness of right shoulder, not elsewhere classified -  Plan: PT plan of care cert/re-cert  Abnormal posture - Plan: PT plan of care cert/re-cert     Problem List Patient Active Problem List   Diagnosis Date Noted  . Renal mass, right 07/03/2017  . Controlled diabetes mellitus type II without complication ([place]) 16/03/9603  . Aortic atherosclerosis ([place]) 06/29/2017  . Goals of care, counseling/discussion 05/29/2017  . Preop exam for internal medicine 11/18/2016  . Chemotherapy-induced neuropathy ([place]) 11/12/2016  . Influenza A 07/22/2016  . Rash 05/13/2016  . Bronchitis 05/13/2016  . Hepatic steatosis 04/18/2016  . Mediastinal lymphadenopathy   . Pulmonary nodules 03/22/2016  . Lung metastases ([place]) 03/15/2016  . Genetic testing 02/16/2016  . Morbid obesity ([place]) 02/03/2016  . Family history of breast cancer   . History of uterine cancer   . Malignant neoplasm of upper-outer quadrant of right breast in female, estrogen receptor positive ([place]) 01/26/2016  . Retinal vein occlusion, branch, left 05/02/2015  . Osteoarthritis of left hip 11/12/2012  . Degenerative arthritis of hip 05/28/2012  . Encounter for well  adult exam with abnormal findings 10/22/2010  . OSTEOARTHRITIS, HIP 09/25/2009  . GLAUCOMA 07/30/2008  . NUMBNESS 07/30/2008  . Dizziness and giddiness 02/29/2008  . LEG PAIN, LEFT 07/06/2007  . DVT, HX OF 07/06/2007  . Diabetes ([place]) 01/04/2007  . Hyperlipidemia 01/04/2007  . Depression with anxiety 01/04/2007  . Obstructive sleep apnea 01/04/2007  . Essential hypertension 01/04/2007  . Asthma 01/04/2007  . GERD 01/04/2007  . TRANSIENT ISCHEMIC ATTACK, HX OF 01/04/2007    Shan Levans, PT 11/07/2017, 9:29 PM  [place], [place], [address] Phone: [phone]   Fax:  [phone]  Name: [name] MRN: [id number] Date of Birth: 1945-08-13

## 2017-11-07 NOTE — Patient Instructions (Signed)
Access Code: [email or web address]  URL: [email or web address]  Date: 11/07/2017  Prepared by: Shan Levans   Exercises  . Doorway Pec Stretch at 90 Degrees Abduction - 3 reps - 30 hold - 3x daily - 7x weekly  . Seated Scapular Retraction - 15 reps - 10 hold - 3x daily - 7x weekly

## 2017-11-10 MED FILL — IBRANCE 75 MG CAPSULE: 75 | 28 days supply | Qty: 21 | Fill #3

## 2017-11-14 ENCOUNTER — Telehealth: Payer: Self-pay

## 2017-11-14 ENCOUNTER — Inpatient Hospital Stay: Payer: Medicare Other | Attending: Oncology

## 2017-11-14 DIAGNOSIS — Z170 Estrogen receptor positive status [ER+]: Secondary | ICD-10-CM | POA: Diagnosis not present

## 2017-11-14 DIAGNOSIS — C50411 Malignant neoplasm of upper-outer quadrant of right female breast: Secondary | ICD-10-CM

## 2017-11-14 DIAGNOSIS — Z79811 Long term (current) use of aromatase inhibitors: Secondary | ICD-10-CM | POA: Diagnosis not present

## 2017-11-14 DIAGNOSIS — K760 Fatty (change of) liver, not elsewhere classified: Secondary | ICD-10-CM

## 2017-11-14 DIAGNOSIS — Z923 Personal history of irradiation: Secondary | ICD-10-CM | POA: Insufficient documentation

## 2017-11-14 DIAGNOSIS — Z8542 Personal history of malignant neoplasm of other parts of uterus: Secondary | ICD-10-CM

## 2017-11-14 DIAGNOSIS — C7801 Secondary malignant neoplasm of right lung: Secondary | ICD-10-CM

## 2017-11-14 LAB — COMPREHENSIVE METABOLIC PANEL
ALT: 14 U/L (ref 0–55)
AST: 17 U/L (ref 5–34)
Albumin: 4 g/dL (ref 3.5–5.0)
Alkaline Phosphatase: 66 U/L (ref 40–150)
Anion gap: 7 (ref 3–11)
BUN: 12 mg/dL (ref 7–26)
CO2: 28 mmol/L (ref 22–29)
Calcium: 9.4 mg/dL (ref 8.4–10.4)
Chloride: 107 mmol/L (ref 98–109)
Creatinine, Ser: 0.82 mg/dL (ref 0.60–1.10)
GFR calc Af Amer: >60 mL/min (ref >60)
GFR calc non Af Amer: >60 mL/min (ref >60)
Glucose, Bld: 147 mg/dL — ABNORMAL HIGH (ref 70–140)
Potassium: 5.2 mmol/L — ABNORMAL HIGH (ref 3.5–5.1)
Sodium: 142 mmol/L (ref 136–145)
Total Bilirubin: 0.4 mg/dL (ref 0.2–1.2)
Total Protein: 6.8 g/dL (ref 6.4–8.3)

## 2017-11-14 LAB — CBC WITH DIFFERENTIAL/PLATELET
Basophils Absolute: 0 10*3/uL (ref 0.0–0.1)
Basophils Relative: 0 %
Eosinophils Absolute: 0 10*3/uL (ref 0.0–0.5)
Eosinophils Relative: 2 %
HCT: 34.9 % (ref 34.8–46.6)
Hemoglobin: 11.6 g/dL (ref 11.6–15.9)
Lymphocytes Relative: 34 %
Lymphs Abs: 0.8 10*3/uL — ABNORMAL LOW (ref 0.9–3.3)
MCH: 30.9 pg (ref 25.1–34.0)
MCHC: 33.2 g/dL (ref 31.5–36.0)
MCV: 92.8 fL (ref 79.5–101.0)
Monocytes Absolute: 0.4 10*3/uL (ref 0.1–0.9)
Monocytes Relative: 17 %
Neutro Abs: 1.1 10*3/uL — ABNORMAL LOW (ref 1.5–6.5)
Neutrophils Relative %: 47 %
Platelets: 126 10*3/uL — ABNORMAL LOW (ref 145–400)
RBC: 3.76 MIL/uL (ref 3.70–5.45)
RDW: 15.6 % — ABNORMAL HIGH (ref 11.2–14.5)
WBC: 2.3 10*3/uL — ABNORMAL LOW (ref 3.9–10.3)

## 2017-11-14 NOTE — Telephone Encounter (Signed)
Per Dr Jana Hakim, pt to start next cycle of Ibrance (today) at 75mg  every other day d/t ANC of 1.1.  Pt made aware.  Pt verbalizes understanding and is aware of next appt on 7/2

## 2017-11-15 ENCOUNTER — Ambulatory Visit: Payer: Medicare Other | Attending: Oncology

## 2017-11-15 DIAGNOSIS — Z483 Aftercare following surgery for neoplasm: Secondary | ICD-10-CM

## 2017-11-15 DIAGNOSIS — M25611 Stiffness of right shoulder, not elsewhere classified: Secondary | ICD-10-CM | POA: Insufficient documentation

## 2017-11-15 DIAGNOSIS — M25511 Pain in right shoulder: Secondary | ICD-10-CM | POA: Diagnosis not present

## 2017-11-15 DIAGNOSIS — R293 Abnormal posture: Secondary | ICD-10-CM

## 2017-11-15 LAB — CANCER ANTIGEN 27.29: CA 27.29: 20.3 U/mL (ref 0.0–38.6)

## 2017-11-15 NOTE — Therapy (Signed)
[place], [place], [address] Phone: [phone]   Fax:  [phone]  Physical Therapy Treatment  Patient Details  Name: [name] MRN: [id number] Date of Birth: 04-25-1946 Referring Provider: Lurline Del, MD   Encounter Date: 11/15/2017  PT End of Session - 11/15/17 1214    Visit Number  2    Number of Visits  4    Date for PT Re-Evaluation  12/12/17    PT Start Time  1017    PT Stop Time  1103    PT Time Calculation (min)  46 min    Activity Tolerance  Patient tolerated treatment well    Behavior During Therapy  [facility] for tasks assessed/performed       Past Medical History:  Diagnosis Date  . Breast cancer ([place])   . Cancer ([place]) 02/2016   right breast  . DIABETES MELLITUS, TYPE II 01/04/2007   only takes actoplus daily  . Dizziness and giddiness 02/29/2008  . DVT, HX OF    at age 18 in right buttocks  . Dyspnea    due to lung cancer  . Family history of breast cancer   . GERD 01/04/2007   pt reports resolved   . GLAUCOMA 07/30/2008   both eyes  . History of blood transfusion    no abnormal  reaction  . History of uterine cancer 2000   hysterectomy done  . HYPERLIPIDEMIA 01/04/2007   taking Pravastatin daily  . HYPERTENSION 01/04/2007   takes Lisinopril daily  . Joint pain   . Joint swelling   . Leg cramps   . LEG PAIN, LEFT 07/06/2007  . NUMBNESS 07/30/2008   in fingers;pt states from Diamox  . OSTEOARTHRITIS, HIP 09/25/2009  . OTITIS MEDIA, ACUTE, BILATERAL 02/29/2008  . Overweight(278.02) 01/04/2007  . Peripheral vascular disease ([place])   . Personal history of radiation therapy 2018  . Pneumonia   . PONV (postoperative nausea and vomiting)   . SLEEP APNEA, OBSTRUCTIVE    doesn't use a cpap;study done about 80yr ago  . TRANSIENT ISCHEMIC ATTACK, HX OF 01/04/2007  . Vision loss    left eye    Past Surgical History:  Procedure Laterality Date  . ABDOMINAL HYSTERECTOMY   2000  . BREAST LUMPECTOMY Right 01/13/2017   x2  . BREAST LUMPECTOMY WITH RADIOACTIVE SEED AND SENTINEL LYMPH NODE BIOPSY Right 01/13/2017   Procedure: RIGHT BREAST RADIOACTIVE SEED X'S 2 GUIDED LUMPECTOMY WITH RADIOACTIVE SEED TARGETED AXILLARYLYMPH NODE EXCISION AND RIGHT AXILLARY SENTINEL LYMPH NODE BIOPSY;  Surgeon: NAlphonsa Overall MD;  [place]  Service: General;  Laterality: Right;  2 SEEDS IN RIGHT BREAST 1 SEED IN RIGHT AXILLARY NODE  . CHOLECYSTECTOMY    . ENDOBRONCHIAL ULTRASOUND Bilateral 03/28/2016   Procedure: ENDOBRONCHIAL ULTRASOUND;  Surgeon: RCollene Gobble MD;  [facility];  Service: Cardiopulmonary;  Laterality: Bilateral;  . EYE SURGERY  13   shunt left and lazer eye surgery on right cataract and retenia tear with repair  . growth removal  2004   from thumb  . KNEE ARTHROSCOPY Right   . mulitple eye surgeries     both eyes, cataracts with ioc done both eyes  . OOPHORECTOMY    . right lumpectomy with axillary node dissection Right 01/2017  . TOTAL HIP ARTHROPLASTY  06/24/2011   Procedure: TOTAL HIP ARTHROPLASTY;  Surgeon: FKerin Salen  [place]  Service: Orthopedics;  Laterality: Right;  . TOTAL HIP ARTHROPLASTY  Left 11/12/2012   Dr Mayer Camel  . TOTAL HIP ARTHROPLASTY Left 11/12/2012   Procedure: TOTAL HIP ARTHROPLASTY;  Surgeon: Kerin Salen, MD;  Location: [place];  Service: Orthopedics;  Laterality: Left;  DEPUY PINNACLE    There were no vitals filed for this visit.  Subjective Assessment - 11/15/17 1020    Subjective  I've been doing the exercises she gave me last week and they help when I do them, but it starts hurting again after.     Pertinent History  status post double right lumpectomies and right axillary lymph node sampling 01/13/2017 for 2 separate invasive ductal carcinoma lesions, pT1a and pT1b, N1a, with negative margins, both lesions being estrogen and progesterone receptor positive and HER-2 negative. CT scans of the chest abdomen and  pelvis obtained 03/10/2016 are consistent with bilateral lung metastases and mediastinal and hilar nodal involvement.  Currently taking letrozole and palbociclib with the later 21 days on/7 days off. Radiation completed 07/05/17.  Uterine cancer with hysterectomy 2000, L THR 2014, R THR 2013    Patient Stated Goals  decrease the pain in the morning and evening    Currently in Pain?  No/denies                       [facility] Adult PT Treatment/Exercise - 11/15/17 0001      Shoulder Exercises: Standing   External Rotation  Strengthening;Right;10 reps;Theraband    Theraband Level (Shoulder External Rotation)  Level 1 (Yellow)    Internal Rotation  Strengthening;Right;10 reps;Theraband    Theraband Level (Shoulder Internal Rotation)  Level 1 (Yellow)    Flexion  Strengthening;Right;10 reps;Theraband    Theraband Level (Shoulder Flexion)  Level 1 (Yellow)    Extension  Strengthening;Right;10 reps;Theraband    Theraband Level (Shoulder Extension)  Level 1 (Yellow)      Shoulder Exercises: Pulleys   Flexion  2 minutes    Flexion Limitations  Pt returning therapist demonstration    Scaption  2 minutes Unable to do abduction diue to pain    Scaption Limitations  Pt returned therapist demonstration and VCs to relax her Rt shoulder      Shoulder Exercises: Therapy Ball   Flexion  Both;10 reps Forward lean into end of stretch    ABduction  Right;10 reps Same side lean into end of stretch      Shoulder Exercises: Stretch   Wall Stretch - ABduction  5 reps 2 x 5 with 5 sec holds; "snow angel"     Wall Stretch - ABduction Limitations  Painful at end of motion so instructed to work within pain tolerance      Iontophoresis   Type of Iontophoresis  Dexamethasone    Location  Rt anterior shoulder and fixated with paper tape    Dose  4 mg/mL    Time  6 hr wear      Manual Therapy   Manual Therapy  Soft tissue mobilization;Passive ROM    Soft tissue mobilization  To Rt shoulder at  anterior joint at area of tendernes gently    Passive ROM  In Supine with HOB elevated: Rt shoulder into flexion and abduction gently and with therpaist providing scapular depresion throughout which alleviated pts end ROM pain.              PT Education - 11/15/17 1213    Education provided  Yes    Education Details  [place] with yellow theraband but also issued red for pt  to progress to later; also instructed pt in iontophoresis, wear time and contraindications/precautions    Person(s) Educated  Patient    Methods  Explanation;Demonstration;Handout handout for [place]    Comprehension  Verbalized understanding;Returned demonstration          PT Long Term Goals - 11/07/17 2124      PT LONG TERM GOAL #1   Title  Pt will be educated on lymphedema resources if needed/interested in pursuing compression in the future    Time  5    Period  Weeks    Status  New    Target Date  12/12/17      PT LONG TERM GOAL #2   Title  Pt will decrease Rt shoulder pain to intermittent only    Baseline  2 at rest    Time  5    Period  Weeks    Status  New    Target Date  12/12/17      PT LONG TERM GOAL #3   Title  Pt will be aware of correct postural changes to make without cueing needed    Baseline  poor posture    Time  5    Period  Weeks    Status  New    Target Date  12/12/17      PT LONG TERM GOAL #4   Title  Pt will be independent with final HEP for continued shoulder strength    Time  5    Period  Weeks    Status  New    Target Date  12/12/17            Plan - 11/15/17 1215    Clinical Impression Statement  Progressed pts HEP to include [place] with yellow theraband which she tolerated very well. Had some mild pain at end of ROM with er with same and also some of AA/ROM stretches so instructed pt multiple times not to push into pain as this would not help her get better quicker. Pt verbalized understanding this after explaining she could cause more irritation to the  joint. First ionotphoresis done today and placed at anterior joint at area of tenderness. After session pt reported her Rt shoulder feeling much better and no pain.     Rehab Potential  Good    Clinical Impairments Affecting Rehab Potential  wanting to only do a few visits    PT Frequency  -- 3-4 visits    PT Duration  4 weeks    PT Treatment/Interventions  Iontophoresis 79m/ml Dexamethasone;Therapeutic exercise;Manual techniques;Manual lymph drainage    PT Next Visit Plan  Assess ionotphoresis and cont if deemed beneficial; reassess A/ROM; review HEP and progress prn; cont Rt shoulder ROM and strength including, scapular strength; kinesiotaping??     PT Home Exercise Plan  [id number] ; [place]     Consulted and Agree with Plan of Care  Patient       Patient will benefit from skilled therapeutic intervention in order to improve the following deficits and impairments:  Decreased range of motion, Decreased activity tolerance, Decreased strength, Impaired UE functional use, Postural dysfunction  Visit Diagnosis: Aftercare following surgery for neoplasm  Acute pain of right shoulder  Stiffness of right shoulder, not elsewhere classified  Abnormal posture     Problem List Patient Active Problem List   Diagnosis Date Noted  . Renal mass, right 07/03/2017  . Controlled diabetes mellitus type II without complication ([place] 079/39/0300 . Aortic atherosclerosis ([place] 06/29/2017  .  Goals of care, counseling/discussion 05/29/2017  . Preop exam for internal medicine 11/18/2016  . Chemotherapy-induced neuropathy ([place]) 11/12/2016  . Influenza A 07/22/2016  . Rash 05/13/2016  . Bronchitis 05/13/2016  . Hepatic steatosis 04/18/2016  . Mediastinal lymphadenopathy   . Pulmonary nodules 03/22/2016  . Lung metastases ([place]) 03/15/2016  . Genetic testing 02/16/2016  . Morbid obesity ([place]) 02/03/2016  . Family history of breast cancer   . History of uterine cancer   . Malignant neoplasm of  upper-outer quadrant of right breast in female, estrogen receptor positive ([place]) 01/26/2016  . Retinal vein occlusion, branch, left 05/02/2015  . Osteoarthritis of left hip 11/12/2012  . Degenerative arthritis of hip 05/28/2012  . Encounter for well adult exam with abnormal findings 10/22/2010  . OSTEOARTHRITIS, HIP 09/25/2009  . GLAUCOMA 07/30/2008  . NUMBNESS 07/30/2008  . Dizziness and giddiness 02/29/2008  . LEG PAIN, LEFT 07/06/2007  . DVT, HX OF 07/06/2007  . Diabetes ([place]) 01/04/2007  . Hyperlipidemia 01/04/2007  . Depression with anxiety 01/04/2007  . Obstructive sleep apnea 01/04/2007  . Essential hypertension 01/04/2007  . Asthma 01/04/2007  . GERD 01/04/2007  . TRANSIENT ISCHEMIC ATTACK, HX OF 01/04/2007    Otelia Limes, PTA 11/15/2017, 12:21 PM  [place], [place], [address] Phone: [phone]   Fax:  [phone]  Name: [name] MRN: [id number] Date of Birth: January 07, 1946

## 2017-11-15 NOTE — Patient Instructions (Signed)

## 2017-11-17 ENCOUNTER — Encounter: Payer: Medicare Other | Admitting: Rehabilitation

## 2017-11-23 ENCOUNTER — Ambulatory Visit: Payer: Medicare Other | Admitting: Rehabilitation

## 2017-11-23 ENCOUNTER — Encounter: Payer: Self-pay | Admitting: Rehabilitation

## 2017-11-23 DIAGNOSIS — R293 Abnormal posture: Secondary | ICD-10-CM

## 2017-11-23 DIAGNOSIS — M25511 Pain in right shoulder: Secondary | ICD-10-CM

## 2017-11-23 DIAGNOSIS — M25611 Stiffness of right shoulder, not elsewhere classified: Secondary | ICD-10-CM | POA: Diagnosis not present

## 2017-11-23 DIAGNOSIS — Z483 Aftercare following surgery for neoplasm: Secondary | ICD-10-CM

## 2017-11-23 NOTE — Therapy (Addendum)
[place], [place], [address] Phone: [phone]   Fax:  [phone]  Physical Therapy Treatment  Patient Details  Name: [name] MRN: [id number] Date of Birth: 12-15-1945 Referring Provider: Lurline Del, MD   Encounter Date: 11/23/2017  PT End of Session - 11/23/17 1718    Visit Number  3    Date for PT Re-Evaluation  12/12/17    PT Start Time  0092    PT Stop Time  1432    PT Time Calculation (min)  37 min    Activity Tolerance  Patient tolerated treatment well    Behavior During Therapy  [facility] for tasks assessed/performed       Past Medical History:  Diagnosis Date  . Breast cancer ([place])   . Cancer ([place]) 02/2016   right breast  . DIABETES MELLITUS, TYPE II 01/04/2007   only takes actoplus daily  . Dizziness and giddiness 02/29/2008  . DVT, HX OF    at age 40 in right buttocks  . Dyspnea    due to lung cancer  . Family history of breast cancer   . GERD 01/04/2007   pt reports resolved   . GLAUCOMA 07/30/2008   both eyes  . History of blood transfusion    no abnormal  reaction  . History of uterine cancer 2000   hysterectomy done  . HYPERLIPIDEMIA 01/04/2007   taking Pravastatin daily  . HYPERTENSION 01/04/2007   takes Lisinopril daily  . Joint pain   . Joint swelling   . Leg cramps   . LEG PAIN, LEFT 07/06/2007  . NUMBNESS 07/30/2008   in fingers;pt states from Diamox  . OSTEOARTHRITIS, HIP 09/25/2009  . OTITIS MEDIA, ACUTE, BILATERAL 02/29/2008  . Overweight(278.02) 01/04/2007  . Peripheral vascular disease ([place])   . Personal history of radiation therapy 2018  . Pneumonia   . PONV (postoperative nausea and vomiting)   . SLEEP APNEA, OBSTRUCTIVE    doesn't use a cpap;study done about 24yr ago  . TRANSIENT ISCHEMIC ATTACK, HX OF 01/04/2007  . Vision loss    left eye    Past Surgical History:  Procedure Laterality Date  . ABDOMINAL HYSTERECTOMY  2000  . BREAST LUMPECTOMY  Right 01/13/2017   x2  . BREAST LUMPECTOMY WITH RADIOACTIVE SEED AND SENTINEL LYMPH NODE BIOPSY Right 01/13/2017   Procedure: RIGHT BREAST RADIOACTIVE SEED X'S 2 GUIDED LUMPECTOMY WITH RADIOACTIVE SEED TARGETED AXILLARYLYMPH NODE EXCISION AND RIGHT AXILLARY SENTINEL LYMPH NODE BIOPSY;  Surgeon: NAlphonsa Overall MD;  [place]  Service: General;  Laterality: Right;  2 SEEDS IN RIGHT BREAST 1 SEED IN RIGHT AXILLARY NODE  . CHOLECYSTECTOMY    . ENDOBRONCHIAL ULTRASOUND Bilateral 03/28/2016   Procedure: ENDOBRONCHIAL ULTRASOUND;  Surgeon: RCollene Gobble MD;  [facility];  Service: Cardiopulmonary;  Laterality: Bilateral;  . EYE SURGERY  13   shunt left and lazer eye surgery on right cataract and retenia tear with repair  . growth removal  2004   from thumb  . KNEE ARTHROSCOPY Right   . mulitple eye surgeries     both eyes, cataracts with ioc done both eyes  . OOPHORECTOMY    . right lumpectomy with axillary node dissection Right 01/2017  . TOTAL HIP ARTHROPLASTY  06/24/2011   Procedure: TOTAL HIP ARTHROPLASTY;  Surgeon: FKerin Salen  [place]  Service: Orthopedics;  Laterality: Right;  . TOTAL HIP ARTHROPLASTY Left 11/12/2012   Dr RMayer Camel .  TOTAL HIP ARTHROPLASTY Left 11/12/2012   Procedure: TOTAL HIP ARTHROPLASTY;  Surgeon: Kerin Salen, MD;  Location: [place];  Service: Orthopedics;  Laterality: Left;  DEPUY PINNACLE    There were no vitals filed for this visit.  Subjective Assessment - 11/23/17 1357    Subjective  I am feeling better.  Less pain.  Still having the most pain in the morning but now I can stretch it around,  Its starting to get where it is just sore.  I couldn't get a difference with the patch and I left it on for 6 hours.      Pertinent History  status post double right lumpectomies and right axillary lymph node sampling 01/13/2017 for 2 separate invasive ductal carcinoma lesions, pT1a and pT1b, N1a, with negative margins, both lesions being estrogen and  progesterone receptor positive and HER-2 negative. CT scans of the chest abdomen and pelvis obtained 03/10/2016 are consistent with bilateral lung metastases and mediastinal and hilar nodal involvement.  Currently taking letrozole and palbociclib with the later 21 days on/7 days off. Radiation completed 07/05/17.  Uterine cancer with hysterectomy 2000, L THR 2014, R THR 2013    Patient Stated Goals  decrease the pain in the morning and evening    Currently in Pain?  No/denies         [facility] PT Assessment - 11/23/17 0001      AROM   Right Shoulder Flexion  155 Degrees    Right Shoulder External Rotation  75 Degrees                   [facility] Adult PT Treatment/Exercise - 11/23/17 0001      Exercises   Exercises  Shoulder      Shoulder Exercises: Supine   Horizontal ABduction  Both;Theraband;15 reps    Theraband Level (Shoulder Horizontal ABduction)  Level 1 (Yellow)    Flexion  Right;15 reps;Theraband    Theraband Level (Shoulder Flexion)  Level 1 (Yellow)      Shoulder Exercises: Standing   External Rotation  Strengthening;10 reps;Theraband;Both    Theraband Level (Shoulder External Rotation)  Level 1 (Yellow)    Extension  Both;20 reps    Theraband Level (Shoulder Extension)  Level 1 (Yellow)    Row  Both;20 reps;Theraband    Theraband Level (Shoulder Row)  Level 1 (Yellow)      Shoulder Exercises: Pulleys   Flexion  2 minutes no pain today with pulley      Iontophoresis   Type of Iontophoresis  Dexamethasone    Location  Rt anterior shoulder and fixated with paper tape    Dose  32m    Time  6 hr wear      Manual Therapy   Soft tissue mobilization  gentle cross friction to subacromial space without lotion to see if the patch will stick better.                   PT Long Term Goals - 11/23/17 1721      PT LONG TERM GOAL #1   Title  Pt will be educated on lymphedema resources if needed/interested in pursuing compression in the future    Status   On-going      PT LONG TERM GOAL #2   Title  Pt will decrease Rt shoulder pain to intermittent only    Status  On-going      PT LONG TERM GOAL #3   Title  Pt will be aware  of correct postural changes to make without cueing needed    Status  On-going      PT LONG TERM GOAL #4   Title  Pt will be independent with final HEP for continued shoulder strength    Status  On-going            Plan - 11/23/17 1719    Clinical Impression Statement  Pt still with tenderness at the subacromial space so another ionto patch was applied.  Tolerating all exercises well and reports she is making improvements.  Pt had initially said she only wanted 3-4 visits but today mentions that she wants to do more after getting back from vacation next week since it is helping.      PT Frequency  2x / week    PT Duration  4 weeks    PT Treatment/Interventions  Iontophoresis '4mg'$ /ml Dexamethasone;Therapeutic exercise;Manual techniques;Manual lymph drainage    PT Next Visit Plan  review HEP and progress prn; cont Rt shoulder ROM and strength including, scapular strength; kinesiotaping??     PT Home Exercise Plan  [id number] ; [place]     Consulted and Agree with Plan of Care  Patient       Patient will benefit from skilled therapeutic intervention in order to improve the following deficits and impairments:  Decreased range of motion, Decreased activity tolerance, Decreased strength, Impaired UE functional use, Postural dysfunction  Visit Diagnosis: Aftercare following surgery for neoplasm  Acute pain of right shoulder  Stiffness of right shoulder, not elsewhere classified  Abnormal posture     Problem List Patient Active Problem List   Diagnosis Date Noted  . Renal mass, right 07/03/2017  . Controlled diabetes mellitus type II without complication ([place]) 69/48/5462  . Aortic atherosclerosis ([place]) 06/29/2017  . Goals of care, counseling/discussion 05/29/2017  . Preop exam for internal medicine  11/18/2016  . Chemotherapy-induced neuropathy ([place]) 11/12/2016  . Influenza A 07/22/2016  . Rash 05/13/2016  . Bronchitis 05/13/2016  . Hepatic steatosis 04/18/2016  . Mediastinal lymphadenopathy   . Pulmonary nodules 03/22/2016  . Lung metastases ([place]) 03/15/2016  . Genetic testing 02/16/2016  . Morbid obesity ([place]) 02/03/2016  . Family history of breast cancer   . History of uterine cancer   . Malignant neoplasm of upper-outer quadrant of right breast in female, estrogen receptor positive ([place]) 01/26/2016  . Retinal vein occlusion, branch, left 05/02/2015  . Osteoarthritis of left hip 11/12/2012  . Degenerative arthritis of hip 05/28/2012  . Encounter for well adult exam with abnormal findings 10/22/2010  . OSTEOARTHRITIS, HIP 09/25/2009  . GLAUCOMA 07/30/2008  . NUMBNESS 07/30/2008  . Dizziness and giddiness 02/29/2008  . LEG PAIN, LEFT 07/06/2007  . DVT, HX OF 07/06/2007  . Diabetes ([place]) 01/04/2007  . Hyperlipidemia 01/04/2007  . Depression with anxiety 01/04/2007  . Obstructive sleep apnea 01/04/2007  . Essential hypertension 01/04/2007  . Asthma 01/04/2007  . GERD 01/04/2007  . TRANSIENT ISCHEMIC ATTACK, HX OF 01/04/2007    Shan Levans, PT 11/23/2017, 5:22 PM  [place], [place], [address] Phone: [phone]   Fax:  [phone]  Name: ELVERNA CAFFEE MRN: [id number] Date of Birth: 1946-06-13  PHYSICAL THERAPY DISCHARGE SUMMARY  Visits from Start of Care: 3  Current functional level related to goals / functional outcomes: Jancie was feeling much better in regards to her shoulder pain.  She reported that she wanted to come back after vacation but did not schedule more.  Remaining deficits: Need for continued strengthening and ROM    Education / Equipment: Given HEP for shoulder  Plan: Patient agrees to discharge.  Patient goals were partially met. Patient is being discharged due  to not returning since the last visit.  ?????    Shan Levans, PT 03/16/18 8:25

## 2017-12-06 ENCOUNTER — Telehealth: Payer: Self-pay

## 2017-12-06 NOTE — Telephone Encounter (Addendum)
Oral Chemotherapy Pharmacy Student Encounter  Follow-Up Form  Spoke with [name] today to follow up regarding her oral chemotherapy medication: Ibrance (palbociclib) for the treatment of HR(+) metastatic breast cancer in conjunction with Femara, planned duration until disease progression or unacceptable toxicity.   Original Start date of oral chemotherapy: 03/29/16  Pt is doing well today.  Pt reports 0 tablets/doses of Ibrance 75 mg capsules, 1 capsule by mouth every other day, with breakfast, taken for 3 weeks on, 1 week off, repeated every 4 weeks, missed in the last month.   Pt reports the following side effects: Occasional tiredness  Pertinent labs reviewed: 11/14/17 - required dose modification from QD to QOD due to neutropenia.  No significant DDI's identified.   Other Issues: None to report  Patient denies any issues obtaining her prescription from the pharmacy.   Patient knows to call the office with questions or concerns. Oral Oncology Clinic will continue to follow.  Thank you,  [name], Pharmacy Student 12/06/2017 11:35 AM Oral Oncology Clinic [phone]   Addendum I have independently assessed the patient and agree with above. Johny Drilling, PharmD, BCPS, BCOP

## 2017-12-11 NOTE — Progress Notes (Signed)
[name]  [phone] Fax:([phone]) [phone]     ID: CARLE DARGAN DOB: February 25, 1946  MR#: [id number]  UXL#:[id number]  Patient Care Team: Biagio Borg, MD as PCP - Stevan Born, MD as Consulting Physician (General Surgery) Eliceo Gladu, Virgie Dad, MD as Consulting Physician (Oncology) Kyung Rudd, MD as Consulting Physician (Radiation Oncology) Bobbye Charleston, MD as Consulting Physician (Obstetrics and Gynecology) Nada Libman, MD as Referring Physician (Specialist) Vevelyn Royals, MD as Consulting Physician (Ophthalmology) Lamonte Sakai Rose Fillers, MD as Consulting Physician (Pulmonary Disease) Alexis Frock, MD as Consulting Physician (Urology) OTHER MD:  CHIEF COMPLAINT: Estrogen receptor positive breast cancer  CURRENT TREATMENT: Letrozole, palbociclib   INTERVAL HISTORY: Unknown returns today for follow-up and treatment of her estrogen receptor positive stage IV breast cancer accompanied by her husband. She continues on letrozole, with good tolerance. She denies issues with hot flashes or vaginal dryness. She pays about $1-2 for this medication.   She also continues on palbociclib, currently at 75 mg daily. She notes that she has been taking it every other day, which makes her feel worse--this is unusual.  When she takes it daily she has more energy.   She has mild shooting pains and sensitivity in the right breast.  This has been present since surgery and has not changed.  She wears comfortable bras.   REVIEW OF SYSTEMS: Shannon reports that she has been busy visiting her grandchildren, going to church, and running errands. They are planning on going to the beach next week. She has not been regularly exercising because she says it's too hot outside. She denies unusual headaches, visual changes, nausea, vomiting, or dizziness. There has been no unusual cough, phlegm production, or pleurisy. This been no change in bowel or bladder habits. She denies  unexplained fatigue or unexplained weight loss, bleeding, rash, or fever. A detailed review of systems was otherwise stable.    BREAST CANCER HISTORY: From the original intake note:  [name] had screening mammography showing some suspicious calcifications in the right breast leading to right diagnostic mammography with ultrasonography 01/22/2016 at [facility]. The breast density was category C. In the upper right breast there was a 2.3 cm mass with additional masses measuring 0.9 and 0.7 cm. There was also a possible additional 0.8 mass in the lower inner quadrant. Ultrasound confirmed an irregular hypoechoic mass in the right breast upper outer quadrant measuring 2.0 cm. There were other masses measuring 0.7 and 0.8 cm by ultrasonography. The right axilla was sonographically benign.  Biopsy of a 12:00 and 4:00 mass in the right breast 01/22/2016 showed ([id number]) both specimens showing invasive ductal carcinoma, grade 1 or 2, both 95% estrogen receptor positive, both 95% progesterone receptor positive, both with strong staining intensity, with MIB-1 ranging from 10-15%, and both HER-2 negative, the signals ratio being 1.23-1.42, and the number per cell 1.85-2.59.  Her subsequent history is as detailed below    PAST MEDICAL HISTORY: Past Medical History:  Diagnosis Date  . Breast cancer ([place])   . Cancer ([place]) 02/2016   right breast  . DIABETES MELLITUS, TYPE II 01/04/2007   only takes actoplus daily  . Dizziness and giddiness 02/29/2008  . DVT, HX OF    at age 8 in right buttocks  . Dyspnea    due to lung cancer  . Family history of breast cancer   . GERD 01/04/2007   pt reports resolved   . GLAUCOMA 07/30/2008   both eyes  . History of blood  transfusion    no abnormal  reaction  . History of uterine cancer 2000   hysterectomy done  . HYPERLIPIDEMIA 01/04/2007   taking Pravastatin daily  . HYPERTENSION 01/04/2007   takes Lisinopril daily  . Joint pain   . Joint swelling   . Leg  cramps   . LEG PAIN, LEFT 07/06/2007  . NUMBNESS 07/30/2008   in fingers;pt states from Diamox  . OSTEOARTHRITIS, HIP 09/25/2009  . OTITIS MEDIA, ACUTE, BILATERAL 02/29/2008  . Overweight(278.02) 01/04/2007  . Peripheral vascular disease ([place])   . Personal history of radiation therapy 2018  . Pneumonia   . PONV (postoperative nausea and vomiting)   . SLEEP APNEA, OBSTRUCTIVE    doesn't use a cpap;study done about 64yr ago  . TRANSIENT ISCHEMIC ATTACK, HX OF 01/04/2007  . Vision loss    left eye    PAST SURGICAL HISTORY: Past Surgical History:  Procedure Laterality Date  . ABDOMINAL HYSTERECTOMY  2000  . BREAST LUMPECTOMY Right 01/13/2017   x2  . BREAST LUMPECTOMY WITH RADIOACTIVE SEED AND SENTINEL LYMPH NODE BIOPSY Right 01/13/2017   Procedure: RIGHT BREAST RADIOACTIVE SEED X'S 2 GUIDED LUMPECTOMY WITH RADIOACTIVE SEED TARGETED AXILLARYLYMPH NODE EXCISION AND RIGHT AXILLARY SENTINEL LYMPH NODE BIOPSY;  Surgeon: NAlphonsa Overall MD;  [facility]  Service: General;  Laterality: Right;  2 SEEDS IN RIGHT BREAST 1 SEED IN RIGHT AXILLARY NODE  . CHOLECYSTECTOMY    . ENDOBRONCHIAL ULTRASOUND Bilateral 03/28/2016   Procedure: ENDOBRONCHIAL ULTRASOUND;  Surgeon: RCollene Gobble MD;  Location: [facility];  Service: Cardiopulmonary;  Laterality: Bilateral;  . EYE SURGERY  13   shunt left and lazer eye surgery on right cataract and retenia tear with repair  . growth removal  2004   from thumb  . KNEE ARTHROSCOPY Right   . mulitple eye surgeries     both eyes, cataracts with ioc done both eyes  . OOPHORECTOMY    . right lumpectomy with axillary node dissection Right 01/2017  . TOTAL HIP ARTHROPLASTY  06/24/2011   Procedure: TOTAL HIP ARTHROPLASTY;  Surgeon: FKerin Salen  [facility]  Service: Orthopedics;  Laterality: Right;  . TOTAL HIP ARTHROPLASTY Left 11/12/2012   Dr RMayer Camel . TOTAL HIP ARTHROPLASTY Left 11/12/2012   Procedure: TOTAL HIP ARTHROPLASTY;  Surgeon: FKerin Salen MD;   Location: [facility]  Service: Orthopedics;  Laterality: Left;  DEPUY PINNACLE    FAMILY HISTORY Family History  Problem Relation Age of Onset  . Dementia Mother   . Cancer Mother        Breast and lung cancer  . Stroke Sister   . Breast cancer Sister 522 . Heart attack Maternal Aunt   . Lung cancer Maternal Grandmother        non smoker  . Glaucoma Maternal Grandfather   . Anesthesia problems Neg Hx   The patient's father died at age 8970 the patient's mother died at age 72 She had breast and lung cancers diagnosed shortly before her death. The patient had no brothers, 2 sisters. One sister was diagnosed with breast cancer at the age of 563  GYNECOLOGIC HISTORY:  No LMP recorded. Patient has had a hysterectomy. Menarche age 72 first live birth age 72 the patient is GX P1. She had a hysterectomy for endometrial cancer in the year 2000. She did not take hormone replacement. She did use oral contraceptives for more than 20 years remotely, with no complications.  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 She is home with [name]. He is a retired Dealer.Their son [name] also lives in [place].    ADVANCED DIRECTIVES: In place  HEALTH MAINTENANCE: Social History   Tobacco Use  . Smoking status: Never Smoker  . Smokeless tobacco: Never Used  Substance Use Topics  . Alcohol use: No  . Drug use: No     Colonoscopy: Never  PAP: Status post hysterectomy  Bone density: Remote   Allergies  Allergen Reactions  . Codeine Hives    Hycodan syrup  . Fluorescein Nausea And Vomiting    ? IV dye for retina specialist  . Lipitor [Atorvastatin Calcium]     Leg cramp  . Oxycodone Nausea And Vomiting    Patient vomited for 3 days after taking  . Sitagliptin Phosphate Nausea And Vomiting  . Sulfa Drugs Cross Reactors Nausea And Vomiting    Current Outpatient Medications  Medication Sig  Dispense Refill  . acetaminophen (TYLENOL) 500 MG tablet Take 1,000 mg by mouth every 4 (four) hours as needed for moderate pain or fever.    Marland Kitchen aspirin EC 81 MG tablet Take 81 mg by mouth daily at 6 PM. 1700    . bimatoprost (LUMIGAN) 0.01 % SOLN Place 1 drop into both eyes at bedtime.    . brimonidine-timolol (COMBIGAN) 0.2-0.5 % ophthalmic solution Place 1 drop into both eyes three times daily    . cholecalciferol (VITAMIN D) 1000 units tablet Take 1,000 Units by mouth daily.    . ciprofloxacin (CIPRO) 500 MG tablet Take 1 tablet (500 mg total) by mouth 2 (two) times daily. 10 tablet 0  . dorzolamide-timolol (COSOPT) 22.3-6.8 MG/ML ophthalmic solution Place 1 drop into both eyes 2 (two) times daily.    Marland Kitchen letrozole (FEMARA) 2.5 MG tablet Take 1 tablet (2.5 mg total) by mouth at bedtime. 90 tablet 3  . lisinopril (PRINIVIL,ZESTRIL) 20 MG tablet Take 1 tablet (20 mg total) by mouth daily. 90 tablet 3  . lovastatin (MEVACOR) 20 MG tablet Take 1 tablet (20 mg total) by mouth every evening. 90 tablet 3  . palbociclib (IBRANCE) 75 MG capsule Take 1 capsule (75 mg total) by mouth daily with breakfast. Take whole with food. 21 capsule 6  . pioglitazone-metformin (ACTOPLUS MET) 15-500 MG tablet Take 1 tablet by mouth daily. 90 tablet 3  . prochlorperazine (COMPAZINE) 10 MG tablet Take 1 tablet (10 mg total) by mouth every 6 (six) hours as needed for nausea or vomiting. (Patient not taking: Reported on 12/06/2017) 30 tablet 0  . valACYclovir (VALTREX) 1000 MG tablet Take 1 tablet (1,000 mg total) by mouth daily. 30 tablet 0   No current facility-administered medications for this visit.      OBJECTIVE: Middle-aged white woman in no acute distress  Vitals:   12/12/17 1049  BP: (!) 165/87  Pulse: 71  Resp: 18  Temp: 98.1 F (36.7 C)  SpO2: 98%     Body mass index is 41.21 kg/m.    ECOG FS:1 - Symptomatic but completely ambulatory Filed Weights   12/12/17 1049  Weight: 240 lb 1.6 oz (108.9 kg)    Sclerae unicteric, EOMs intact Oropharynx clear and moist No cervical or supraclavicular adenopathy Lungs no rales or rhonchi Heart regular rate and rhythm Abd soft, nontender, positive bowel sounds MSK no focal spinal tenderness, no upper extremity lymphedema Neuro: nonfocal, well oriented, appropriate affect Breasts: The  right breast is status post lumpectomy, with some distortion of the breast contour.  It is also status post radiation, with some thickening of the skin as expected.  There is no evidence of local recurrence.  The left breast is benign.  Both axillae are benign.  LAB RESULTS:  CMP     Component Value Date/Time   NA 142 11/14/2017 1105   NA 140 05/29/2017 1254   K 5.2 (H) 11/14/2017 1105   K 4.4 05/29/2017 1254   CL 107 11/14/2017 1105   CO2 28 11/14/2017 1105   CO2 25 05/29/2017 1254   GLUCOSE 147 (H) 11/14/2017 1105   GLUCOSE 154 (H) 05/29/2017 1254   BUN 12 11/14/2017 1105   BUN 11.5 05/29/2017 1254   CREATININE 0.82 11/14/2017 1105   CREATININE 0.8 05/29/2017 1254   CALCIUM 9.4 11/14/2017 1105   CALCIUM 9.3 05/29/2017 1254   PROT 6.8 11/14/2017 1105   PROT 7.0 05/29/2017 1254   ALBUMIN 4.0 11/14/2017 1105   ALBUMIN 4.1 05/29/2017 1254   AST 17 11/14/2017 1105   AST 13 05/29/2017 1254   ALT 14 11/14/2017 1105   ALT 14 05/29/2017 1254   ALKPHOS 66 11/14/2017 1105   ALKPHOS 66 05/29/2017 1254   BILITOT 0.4 11/14/2017 1105   BILITOT 0.50 05/29/2017 1254   GFRNONAA >60 11/14/2017 1105   GFRAA >60 11/14/2017 1105    INo results found for: SPEP, UPEP  Lab Results  Component Value Date   WBC 3.0 (L) 12/12/2017   NEUTROABS 1.8 12/12/2017   HGB 11.7 12/12/2017   HCT 34.7 (L) 12/12/2017   MCV 92.0 12/12/2017   PLT 141 (L) 12/12/2017      Chemistry      Component Value Date/Time   NA 142 11/14/2017 1105   NA 140 05/29/2017 1254   K 5.2 (H) 11/14/2017 1105   K 4.4 05/29/2017 1254   CL 107 11/14/2017 1105   CO2 28 11/14/2017 1105   CO2 25  05/29/2017 1254   BUN 12 11/14/2017 1105   BUN 11.5 05/29/2017 1254   CREATININE 0.82 11/14/2017 1105   CREATININE 0.8 05/29/2017 1254      Component Value Date/Time   CALCIUM 9.4 11/14/2017 1105   CALCIUM 9.3 05/29/2017 1254   ALKPHOS 66 11/14/2017 1105   ALKPHOS 66 05/29/2017 1254   AST 17 11/14/2017 1105   AST 13 05/29/2017 1254   ALT 14 11/14/2017 1105   ALT 14 05/29/2017 1254   BILITOT 0.4 11/14/2017 1105   BILITOT 0.50 05/29/2017 1254       No results found for: LABCA2  No components found for: LABCA125  No results for input(s): INR in the last 168 hours.  Urinalysis    Component Value Date/Time   COLORURINE YELLOW 08/18/2017 1322   APPEARANCEUR HAZY (A) 08/18/2017 1322   LABSPEC 1.010 08/18/2017 1322   PHURINE 7.0 08/18/2017 1322   GLUCOSEU NEGATIVE 08/18/2017 1322   GLUCOSEU NEGATIVE 10/30/2014 1513   HGBUR LARGE (A) 08/18/2017 1322   BILIRUBINUR NEGATIVE 08/18/2017 1322   KETONESUR NEGATIVE 08/18/2017 1322   PROTEINUR 30 (A) 08/18/2017 1322   UROBILINOGEN 0.2 10/30/2014 1513   NITRITE NEGATIVE 08/18/2017 1322   LEUKOCYTESUR LARGE (A) 08/18/2017 1322    STUDIES: Restaging Chest CT on 10/17/2017 showed no new or progressive metastatic disease in the chest. Small left lower lobe pulmonary nodule is stable. No new or enlarging pulmonary nodules. No recurrent thoracic nodal metastases.  New suspected postradiation change in the anterior upper  right lung. Slightly decreased asymmetric skin thickening in the right breast compatible with evolving postradiation change. Interval stability of indeterminate 2.7 cm renal cortical mass in the upper right kidney, increased in size since 2017 CT, renal cell carcinoma not excluded. Aortic Atherosclerosis (ICD10-I70.0).  ELIGIBLE FOR AVAILABLE RESEARCH PROTOCOL: no  ASSESSMENT: 72 y.o. [place] woman with a remote history of early stage endometrial cancer, now status post right breast upper outer quadrant biopsy  01/22/2016 for a clinically multifocal T2 N0, stage 2A invasive ductal carcinoma, grade 1, estrogen and progesterone receptor positive, HER-2 negative, with an MIB-1 between 10 and 15%.  (1) right axillary lymph node biopsy 03/02/2016 positive  (2) genetics testing 01/13/2016 through the Custom gene panel offered by [facility] found no deleterious mutations in  ATM, BARD1, BRCA1, BRCA2, BRIP1, CDH1, CHEK2, EPCAM, FANCC, MLH1, MSH2, MSH6, MUTYH, NBN, PALB2, PMS2, POLD1, PTEN, RAD51C, RAD51D, TP53, and XRCC2  METASTATIC DISEASE: OCT 2017 (3) CT scans of the chest abdomen and pelvis obtained 03/10/2016 are consistent with bilateral lung metastases and mediastinal and hilar nodal involvement, but no liver or bone spread  (a) bronchoscopic lymph node biopsy 2 (station 7, 13R) 03/28/2016 confirms metastatic adenocarcinoma, estrogen receptor positive, HER-2 not amplified  (b) baseline CA-27-29 on 04/18/2016 was 137.5.  (4) letrozole started 03/15/2016, palbociclib added 03/29/2016 at 125 mg/day, 21/7  (a) dose decreased to 100 mg per day, 21/7, beginning with February cycle  (b) palbociclib held 01/31/2017, with increasing symptoms  (c) palbociclib resumed October 2018 at 75 mg daily  (d) palbociclib dose reduced to 75 mg every other day February through April 2019  (e) palbociclib dose resumed at 75 mg daily as of 10/17/2017  (5) status post double right lumpectomies and right axillary lymph node sampling 01/13/2017 for 2 separate invasive ductal carcinoma lesions, pT1a and pT1b, N1a, with negative margins, both lesions being estrogen and progesterone receptor positive and HER-2 negative  (6) adjuvant radiation completed 07/05/2017 1. 50.4 Gy in 28 fractions to the right breast and supraclavicular region using whole-breast tangent fields. 2. Boost to the seroma delivered an additional 10 Gy in 5 fractions. The total dose was 60.4 Gy.  (7) restaging studies:  (a) CT scan of the chest and bone scan  06/23/2017 showed stable scattered very small lung nodules, no bone lesions  (b) CT of the chest 10/17/2017 showed no new or progressive metastatic disease in the chest. The small left lower lobe pulmonary nodule is stable   (8) right upper pole renal lesion noted to be enlarging on CT scan 06/22/2017   PLAN: Nastasia is now almost a year out from definitive surgery for her breast cancer and a little over 6 months out from completion of radiation.  Clinically there is no evidence of active disease.  This is favorable.  She is tolerating the palbociclib generally well.  It is remarkable that she feels better when she takes more, possibly because she worries less when she does that and she tolerates the letrozole well.  The plan will be to continue both these agents as tolerated until there is evidence of disease progression.  She will see me again at the end of this month at the start of the next cycle.  She already has appointments with radiation oncology and surgery for August.  I am going to try to obtain a PET scan in November  She knows to call for any other issues that may develop before the next visit.   Peightyn Roberson, Virgie Dad, MD  12/12/17 10:58 AM Medical Oncology and Hematology [facility] [address] [place], [place] [address] Tel. [phone]    Fax. [phone]  Alice Rieger, am acting as scribe for Chauncey Cruel MD.  I, Lurline Del MD, have reviewed the above documentation for accuracy and completeness, and I agree with the above.

## 2017-12-12 ENCOUNTER — Inpatient Hospital Stay: Payer: Medicare Other | Attending: Oncology

## 2017-12-12 ENCOUNTER — Inpatient Hospital Stay (HOSPITAL_BASED_OUTPATIENT_CLINIC_OR_DEPARTMENT_OTHER): Payer: Medicare Other | Admitting: Oncology

## 2017-12-12 VITALS — BP 165/87 | HR 71 | Temp 98.1°F | Resp 18 | Ht 64.0 in | Wt 240.1 lb

## 2017-12-12 DIAGNOSIS — Z923 Personal history of irradiation: Secondary | ICD-10-CM | POA: Insufficient documentation

## 2017-12-12 DIAGNOSIS — C50411 Malignant neoplasm of upper-outer quadrant of right female breast: Secondary | ICD-10-CM | POA: Diagnosis not present

## 2017-12-12 DIAGNOSIS — C7802 Secondary malignant neoplasm of left lung: Secondary | ICD-10-CM | POA: Insufficient documentation

## 2017-12-12 DIAGNOSIS — Z79811 Long term (current) use of aromatase inhibitors: Secondary | ICD-10-CM | POA: Diagnosis not present

## 2017-12-12 DIAGNOSIS — Z7984 Long term (current) use of oral hypoglycemic drugs: Secondary | ICD-10-CM | POA: Insufficient documentation

## 2017-12-12 DIAGNOSIS — Z8673 Personal history of transient ischemic attack (TIA), and cerebral infarction without residual deficits: Secondary | ICD-10-CM | POA: Diagnosis not present

## 2017-12-12 DIAGNOSIS — M1990 Unspecified osteoarthritis, unspecified site: Secondary | ICD-10-CM | POA: Diagnosis not present

## 2017-12-12 DIAGNOSIS — Z170 Estrogen receptor positive status [ER+]: Secondary | ICD-10-CM | POA: Insufficient documentation

## 2017-12-12 DIAGNOSIS — Z7982 Long term (current) use of aspirin: Secondary | ICD-10-CM | POA: Insufficient documentation

## 2017-12-12 DIAGNOSIS — E785 Hyperlipidemia, unspecified: Secondary | ICD-10-CM | POA: Insufficient documentation

## 2017-12-12 DIAGNOSIS — Z803 Family history of malignant neoplasm of breast: Secondary | ICD-10-CM | POA: Insufficient documentation

## 2017-12-12 DIAGNOSIS — Z79899 Other long term (current) drug therapy: Secondary | ICD-10-CM | POA: Insufficient documentation

## 2017-12-12 DIAGNOSIS — C7800 Secondary malignant neoplasm of unspecified lung: Secondary | ICD-10-CM

## 2017-12-12 DIAGNOSIS — R0981 Nasal congestion: Secondary | ICD-10-CM | POA: Diagnosis not present

## 2017-12-12 DIAGNOSIS — E119 Type 2 diabetes mellitus without complications: Secondary | ICD-10-CM | POA: Insufficient documentation

## 2017-12-12 DIAGNOSIS — C7801 Secondary malignant neoplasm of right lung: Secondary | ICD-10-CM | POA: Insufficient documentation

## 2017-12-12 DIAGNOSIS — Z8542 Personal history of malignant neoplasm of other parts of uterus: Secondary | ICD-10-CM

## 2017-12-12 DIAGNOSIS — R5383 Other fatigue: Secondary | ICD-10-CM | POA: Insufficient documentation

## 2017-12-12 DIAGNOSIS — Z801 Family history of malignant neoplasm of trachea, bronchus and lung: Secondary | ICD-10-CM | POA: Diagnosis not present

## 2017-12-12 DIAGNOSIS — C773 Secondary and unspecified malignant neoplasm of axilla and upper limb lymph nodes: Secondary | ICD-10-CM | POA: Diagnosis not present

## 2017-12-12 DIAGNOSIS — K760 Fatty (change of) liver, not elsewhere classified: Secondary | ICD-10-CM

## 2017-12-12 LAB — CBC WITH DIFFERENTIAL/PLATELET
Basophils Absolute: 0 10*3/uL (ref 0.0–0.1)
Basophils Relative: 0 %
Eosinophils Absolute: 0 10*3/uL (ref 0.0–0.5)
Eosinophils Relative: 1 %
HCT: 34.7 % — ABNORMAL LOW (ref 34.8–46.6)
Hemoglobin: 11.7 g/dL (ref 11.6–15.9)
Lymphocytes Relative: 28 %
Lymphs Abs: 0.8 10*3/uL — ABNORMAL LOW (ref 0.9–3.3)
MCH: 31 pg (ref 25.1–34.0)
MCHC: 33.7 g/dL (ref 31.5–36.0)
MCV: 92 fL (ref 79.5–101.0)
Monocytes Absolute: 0.3 10*3/uL (ref 0.1–0.9)
Monocytes Relative: 11 %
Neutro Abs: 1.8 10*3/uL (ref 1.5–6.5)
Neutrophils Relative %: 60 %
Platelets: 141 10*3/uL — ABNORMAL LOW (ref 145–400)
RBC: 3.77 MIL/uL (ref 3.70–5.45)
RDW: 15.2 % — ABNORMAL HIGH (ref 11.2–14.5)
WBC: 3 10*3/uL — ABNORMAL LOW (ref 3.9–10.3)

## 2017-12-12 LAB — COMPREHENSIVE METABOLIC PANEL
ALT: 17 U/L (ref 0–44)
AST: 14 U/L — ABNORMAL LOW (ref 15–41)
Albumin: 4 g/dL (ref 3.5–5.0)
Alkaline Phosphatase: 70 U/L (ref 38–126)
Anion gap: 5 (ref 5–15)
BUN: 14 mg/dL (ref 8–23)
CO2: 30 mmol/L (ref 22–32)
Calcium: 9.4 mg/dL (ref 8.9–10.3)
Chloride: 105 mmol/L (ref 98–111)
Creatinine, Ser: 0.88 mg/dL (ref 0.44–1.00)
GFR calc Af Amer: >60 mL/min (ref >60)
GFR calc non Af Amer: >60 mL/min (ref >60)
Glucose, Bld: 186 mg/dL — ABNORMAL HIGH (ref 70–99)
Potassium: 4.9 mmol/L (ref 3.5–5.1)
Sodium: 140 mmol/L (ref 135–145)
Total Bilirubin: 0.4 mg/dL (ref 0.3–1.2)
Total Protein: 6.7 g/dL (ref 6.5–8.1)

## 2017-12-12 MED ORDER — PALBOCICLIB 75 MG PO CAPS: 75.0000 mg | ORAL_CAPSULE | Freq: Every day | ORAL | 6 refills | Status: DC

## 2017-12-12 MED FILL — IBRANCE 75 MG CAPSULE: 75 | 28 days supply | Qty: 21 | Fill #4

## 2017-12-13 LAB — CANCER ANTIGEN 27.29: CA 27.29: 21.9 U/mL (ref 0.0–38.6)

## 2018-01-05 ENCOUNTER — Other Ambulatory Visit: Payer: Self-pay | Admitting: Urology

## 2018-01-05 DIAGNOSIS — D49511 Neoplasm of unspecified behavior of right kidney: Secondary | ICD-10-CM

## 2018-01-05 DIAGNOSIS — D4101 Neoplasm of uncertain behavior of right kidney: Secondary | ICD-10-CM

## 2018-01-05 MED FILL — IBRANCE 75 MG CAPSULE: 75 | 28 days supply | Qty: 21 | Fill #5

## 2018-01-09 ENCOUNTER — Inpatient Hospital Stay (HOSPITAL_BASED_OUTPATIENT_CLINIC_OR_DEPARTMENT_OTHER): Payer: Medicare Other | Admitting: Adult Health

## 2018-01-09 ENCOUNTER — Telehealth: Payer: Self-pay | Admitting: Adult Health

## 2018-01-09 ENCOUNTER — Inpatient Hospital Stay: Payer: Medicare Other

## 2018-01-09 ENCOUNTER — Encounter: Payer: Self-pay | Admitting: Adult Health

## 2018-01-09 VITALS — BP 161/66 | HR 63 | Temp 98.0°F | Resp 18 | Ht 64.0 in | Wt 237.7 lb

## 2018-01-09 DIAGNOSIS — R0981 Nasal congestion: Secondary | ICD-10-CM

## 2018-01-09 DIAGNOSIS — C773 Secondary and unspecified malignant neoplasm of axilla and upper limb lymph nodes: Secondary | ICD-10-CM | POA: Diagnosis not present

## 2018-01-09 DIAGNOSIS — M1990 Unspecified osteoarthritis, unspecified site: Secondary | ICD-10-CM

## 2018-01-09 DIAGNOSIS — R5383 Other fatigue: Secondary | ICD-10-CM | POA: Diagnosis not present

## 2018-01-09 DIAGNOSIS — C50411 Malignant neoplasm of upper-outer quadrant of right female breast: Secondary | ICD-10-CM

## 2018-01-09 DIAGNOSIS — Z170 Estrogen receptor positive status [ER+]: Secondary | ICD-10-CM

## 2018-01-09 DIAGNOSIS — E119 Type 2 diabetes mellitus without complications: Secondary | ICD-10-CM | POA: Diagnosis not present

## 2018-01-09 DIAGNOSIS — C7801 Secondary malignant neoplasm of right lung: Secondary | ICD-10-CM

## 2018-01-09 DIAGNOSIS — Z79811 Long term (current) use of aromatase inhibitors: Secondary | ICD-10-CM

## 2018-01-09 DIAGNOSIS — C7802 Secondary malignant neoplasm of left lung: Secondary | ICD-10-CM | POA: Diagnosis not present

## 2018-01-09 DIAGNOSIS — Z8673 Personal history of transient ischemic attack (TIA), and cerebral infarction without residual deficits: Secondary | ICD-10-CM

## 2018-01-09 DIAGNOSIS — Z792 Long term (current) use of antibiotics: Secondary | ICD-10-CM

## 2018-01-09 DIAGNOSIS — Z79899 Other long term (current) drug therapy: Secondary | ICD-10-CM

## 2018-01-09 DIAGNOSIS — Z8542 Personal history of malignant neoplasm of other parts of uterus: Secondary | ICD-10-CM

## 2018-01-09 DIAGNOSIS — Z923 Personal history of irradiation: Secondary | ICD-10-CM

## 2018-01-09 DIAGNOSIS — E785 Hyperlipidemia, unspecified: Secondary | ICD-10-CM | POA: Diagnosis not present

## 2018-01-09 DIAGNOSIS — K760 Fatty (change of) liver, not elsewhere classified: Secondary | ICD-10-CM

## 2018-01-09 DIAGNOSIS — Z7984 Long term (current) use of oral hypoglycemic drugs: Secondary | ICD-10-CM

## 2018-01-09 LAB — CBC WITH DIFFERENTIAL/PLATELET
Basophils Absolute: 0 10*3/uL (ref 0.0–0.1)
Basophils Relative: 1 %
Eosinophils Absolute: 0 10*3/uL (ref 0.0–0.5)
Eosinophils Relative: 1 %
HCT: 33.7 % — ABNORMAL LOW (ref 34.8–46.6)
Hemoglobin: 11.6 g/dL (ref 11.6–15.9)
Lymphocytes Relative: 37 %
Lymphs Abs: 0.7 10*3/uL — ABNORMAL LOW (ref 0.9–3.3)
MCH: 31.5 pg (ref 25.1–34.0)
MCHC: 34.3 g/dL (ref 31.5–36.0)
MCV: 91.8 fL (ref 79.5–101.0)
Monocytes Absolute: 0.3 10*3/uL (ref 0.1–0.9)
Monocytes Relative: 14 %
Neutro Abs: 0.9 10*3/uL — ABNORMAL LOW (ref 1.5–6.5)
Neutrophils Relative %: 47 %
Platelets: 130 10*3/uL — ABNORMAL LOW (ref 145–400)
RBC: 3.67 MIL/uL — ABNORMAL LOW (ref 3.70–5.45)
RDW: 16.7 % — ABNORMAL HIGH (ref 11.2–14.5)
WBC: 2 10*3/uL — ABNORMAL LOW (ref 3.9–10.3)

## 2018-01-09 LAB — COMPREHENSIVE METABOLIC PANEL
ALT: 13 U/L (ref 0–44)
AST: 13 U/L — ABNORMAL LOW (ref 15–41)
Albumin: 4.1 g/dL (ref 3.5–5.0)
Alkaline Phosphatase: 67 U/L (ref 38–126)
Anion gap: 8 (ref 5–15)
BUN: 11 mg/dL (ref 8–23)
CO2: 28 mmol/L (ref 22–32)
Calcium: 9.4 mg/dL (ref 8.9–10.3)
Chloride: 108 mmol/L (ref 98–111)
Creatinine, Ser: 0.95 mg/dL (ref 0.44–1.00)
GFR calc Af Amer: >60 mL/min (ref >60)
GFR calc non Af Amer: 58 mL/min — ABNORMAL LOW (ref >60)
Glucose, Bld: 182 mg/dL — ABNORMAL HIGH (ref 70–99)
Potassium: 4.4 mmol/L (ref 3.5–5.1)
Sodium: 144 mmol/L (ref 135–145)
Total Bilirubin: 0.6 mg/dL (ref 0.3–1.2)
Total Protein: 6.9 g/dL (ref 6.5–8.1)

## 2018-01-09 NOTE — Progress Notes (Signed)
[place]  Telephone:([phone]) [phone] Fax:(336) [phone]     ID: [name] DOB: 07/10/1945  MR#: [id number]  ONG#:[id number]  Patient Care Team: Biagio Borg, MD as PCP - Stevan Born, MD as Consulting Physician (General Surgery) Magrinat, Virgie Dad, MD as Consulting Physician (Oncology) Kyung Rudd, MD as Consulting Physician (Radiation Oncology) Bobbye Charleston, MD as Consulting Physician (Obstetrics and Gynecology) Nada Libman, MD as Referring Physician (Specialist) Vevelyn Royals, MD as Consulting Physician (Ophthalmology) Lamonte Sakai Rose Fillers, MD as Consulting Physician (Pulmonary Disease) Alexis Frock, MD as Consulting Physician (Urology) OTHER MD:  CHIEF COMPLAINT: Estrogen receptor positive breast cancer  CURRENT TREATMENT: Letrozole, palbociclib   INTERVAL HISTORY: [name] returns today for follow-up and treatment of her estrogen receptor positive stage IV breast cancer accompanied by her husband. She continues on letrozole, with good tolerance.   She also continues on palbociclib, currently at 75 mg daily. She was taking it every other day, however her WBC had improved so she went back to taking it daily.  She tolerates it well.     REVIEW OF SYSTEMS: [name] notes she has continued to have breast pain and tenderness that she mentioned to Dr. Jana Hakim at her last appointment.  She notes she eats a lot of fast food and drinks 3 large cups of coffee per day.  She has no new pain anywhere else.  She is fatigued, but this is unchanged.  She has a MRI of the abdomen to evaluate the kidney on 01/13/2018.    She has noted coughing, sneezing, and nasal drainage, worse in the mornings.  She says its chronic and worsening.  She can take an allergy pill, but notes she isn't supposed to with glaucoma.  The pills help, but don't completely relieve it. She says that she cannot take flonase with glaucoma.  She has not tried saline nasal spray.    [name] is  doing well otherwise and is without any questions or concerns.  A detailed ROS is otherwise non contributory.     BREAST CANCER HISTORY: From the original intake note:  [name] had screening mammography showing some suspicious calcifications in the right breast leading to right diagnostic mammography with ultrasonography 01/22/2016 at [facility]. The breast density was category C. In the upper right breast there was a 2.3 cm mass with additional masses measuring 0.9 and 0.7 cm. There was also a possible additional 0.8 mass in the lower inner quadrant. Ultrasound confirmed an irregular hypoechoic mass in the right breast upper outer quadrant measuring 2.0 cm. There were other masses measuring 0.7 and 0.8 cm by ultrasonography. The right axilla was sonographically benign.  Biopsy of a 12:00 and 4:00 mass in the right breast 01/22/2016 showed ([id number]) both specimens showing invasive ductal carcinoma, grade 1 or 2, both 95% estrogen receptor positive, both 95% progesterone receptor positive, both with strong staining intensity, with MIB-1 ranging from 10-15%, and both HER-2 negative, the signals ratio being 1.23-1.42, and the number per cell 1.85-2.59.  Her subsequent history is as detailed below    PAST MEDICAL HISTORY: Past Medical History:  Diagnosis Date  . Breast cancer ([place])   . Cancer ([place]) 02/2016   right breast  . DIABETES MELLITUS, TYPE II 01/04/2007   only takes actoplus daily  . Dizziness and giddiness 02/29/2008  . DVT, HX OF    at age 79 in right buttocks  . Dyspnea    due to lung cancer  . Family history of breast cancer   .  GERD 01/04/2007   pt reports resolved   . GLAUCOMA 07/30/2008   both eyes  . History of blood transfusion    no abnormal  reaction  . History of uterine cancer 2000   hysterectomy done  . HYPERLIPIDEMIA 01/04/2007   taking Pravastatin daily  . HYPERTENSION 01/04/2007   takes Lisinopril daily  . Joint pain   . Joint swelling   . Leg cramps   . LEG  PAIN, LEFT 07/06/2007  . NUMBNESS 07/30/2008   in fingers;pt states from Diamox  . OSTEOARTHRITIS, HIP 09/25/2009  . OTITIS MEDIA, ACUTE, BILATERAL 02/29/2008  . Overweight(278.02) 01/04/2007  . Peripheral vascular disease ([place])   . Personal history of radiation therapy 2018  . Pneumonia   . PONV (postoperative nausea and vomiting)   . SLEEP APNEA, OBSTRUCTIVE    doesn't use a cpap;study done about 46yr ago  . TRANSIENT ISCHEMIC ATTACK, HX OF 01/04/2007  . Vision loss    left eye    PAST SURGICAL HISTORY: Past Surgical History:  Procedure Laterality Date  . ABDOMINAL HYSTERECTOMY  2000  . BREAST LUMPECTOMY Right 01/13/2017   x2  . BREAST LUMPECTOMY WITH RADIOACTIVE SEED AND SENTINEL LYMPH NODE BIOPSY Right 01/13/2017   Procedure: RIGHT BREAST RADIOACTIVE SEED X'S 2 GUIDED LUMPECTOMY WITH RADIOACTIVE SEED TARGETED AXILLARYLYMPH NODE EXCISION AND RIGHT AXILLARY SENTINEL LYMPH NODE BIOPSY;  Surgeon: NAlphonsa Overall MD;  [facility]  Service: General;  Laterality: Right;  2 SEEDS IN RIGHT BREAST 1 SEED IN RIGHT AXILLARY NODE  . CHOLECYSTECTOMY    . ENDOBRONCHIAL ULTRASOUND Bilateral 03/28/2016   Procedure: ENDOBRONCHIAL ULTRASOUND;  Surgeon: RCollene Gobble MD;  Location: [facility];  Service: Cardiopulmonary;  Laterality: Bilateral;  . EYE SURGERY  13   shunt left and lazer eye surgery on right cataract and retenia tear with repair  . growth removal  2004   from thumb  . KNEE ARTHROSCOPY Right   . mulitple eye surgeries     both eyes, cataracts with ioc done both eyes  . OOPHORECTOMY    . right lumpectomy with axillary node dissection Right 01/2017  . TOTAL HIP ARTHROPLASTY  06/24/2011   Procedure: TOTAL HIP ARTHROPLASTY;  Surgeon: FKerin Salen  [facility]  Service: Orthopedics;  Laterality: Right;  . TOTAL HIP ARTHROPLASTY Left 11/12/2012   Dr RMayer Camel . TOTAL HIP ARTHROPLASTY Left 11/12/2012   Procedure: TOTAL HIP ARTHROPLASTY;  Surgeon: FKerin Salen MD;  [facility]  Service: Orthopedics;  Laterality: Left;  DEPUY PINNACLE    FAMILY HISTORY Family History  Problem Relation Age of Onset  . Dementia Mother   . Cancer Mother        Breast and lung cancer  . Stroke Sister   . Breast cancer Sister 5105 . Heart attack Maternal Aunt   . Lung cancer Maternal Grandmother        non smoker  . Glaucoma Maternal Grandfather   . Anesthesia problems Neg Hx   The patient's father died at age 72 the patient's mother died at age 72 She had breast and lung cancers diagnosed shortly before her death. The patient had no brothers, 2 sisters. One sister was diagnosed with breast cancer at the age of 545  GYNECOLOGIC HISTORY:  No LMP recorded. Patient has had a hysterectomy. Menarche age 72 first live birth age 72 the patient is GX P1. She had a hysterectomy for endometrial cancer in the year 2000. She did not take hormone  replacement. She did use oral contraceptives for more than 20 years remotely, with no complications.  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 She is home with [name]. He is a retired Dealer.Their son [name] also lives in [place].    ADVANCED DIRECTIVES: In place  HEALTH MAINTENANCE: Social History   Tobacco Use  . Smoking status: Never Smoker  . Smokeless tobacco: Never Used  Substance Use Topics  . Alcohol use: No  . Drug use: No     Colonoscopy: Never  PAP: Status post hysterectomy  Bone density: Remote   Allergies  Allergen Reactions  . Codeine Hives    Hycodan syrup  . Fluorescein Nausea And Vomiting    ? IV dye for retina specialist  . Lipitor [Atorvastatin Calcium]     Leg cramp  . Oxycodone Nausea And Vomiting    Patient vomited for 3 days after taking  . Sitagliptin Phosphate Nausea And Vomiting  . Sulfa Drugs Cross Reactors Nausea And Vomiting    Current Outpatient Medications  Medication Sig Dispense  Refill  . acetaminophen (TYLENOL) 500 MG tablet Take 1,000 mg by mouth every 4 (four) hours as needed for moderate pain or fever.    Marland Kitchen aspirin EC 81 MG tablet Take 81 mg by mouth daily at 6 PM. 1700    . bimatoprost (LUMIGAN) 0.01 % SOLN Place 1 drop into both eyes at bedtime.    . brimonidine-timolol (COMBIGAN) 0.2-0.5 % ophthalmic solution Place 1 drop into both eyes three times daily    . cholecalciferol (VITAMIN D) 1000 units tablet Take 1,000 Units by mouth daily.    . dorzolamide-timolol (COSOPT) 22.3-6.8 MG/ML ophthalmic solution Place 1 drop into both eyes 2 (two) times daily.    Marland Kitchen letrozole (FEMARA) 2.5 MG tablet Take 1 tablet (2.5 mg total) by mouth at bedtime. 90 tablet 3  . lisinopril (PRINIVIL,ZESTRIL) 20 MG tablet Take 1 tablet (20 mg total) by mouth daily. 90 tablet 3  . lovastatin (MEVACOR) 20 MG tablet Take 1 tablet (20 mg total) by mouth every evening. 90 tablet 3  . palbociclib (IBRANCE) 75 MG capsule Take 1 capsule (75 mg total) by mouth daily with breakfast. Take whole with food. 21 capsule 6  . pioglitazone-metformin (ACTOPLUS MET) 15-500 MG tablet Take 1 tablet by mouth daily. 90 tablet 3  . valACYclovir (VALTREX) 1000 MG tablet Take 1 tablet (1,000 mg total) by mouth daily. 30 tablet 0   No current facility-administered medications for this visit.      OBJECTIVE: Middle-aged white woman in no acute distress  Vitals:   01/09/18 1120  BP: (!) 161/66  Pulse: 63  Resp: 18  Temp: 98 F (36.7 C)  SpO2: 100%     Body mass index is 40.8 kg/m.    ECOG FS:1 - Symptomatic but completely ambulatory Filed Weights   01/09/18 1120  Weight: 237 lb 11.2 oz (107.8 kg)   GENERAL: Patient is a well appearing female in no acute distress HEENT:  Sclerae anicteric.  Oropharynx clear and moist. No ulcerations or evidence of oropharyngeal candidiasis. Neck is supple.  NODES:  No cervical, supraclavicular, or axillary lymphadenopathy palpated.  BREAST EXAM:  Deferred. LUNGS:  Clear  to auscultation bilaterally.  No wheezes or rhonchi. HEART:  Regular rate and rhythm. No murmur appreciated. ABDOMEN:  Soft, nontender.  Positive, normoactive bowel sounds. No organomegaly palpated. MSK:  No focal  spinal tenderness to palpation. Full range of motion bilaterally in the upper extremities. EXTREMITIES:  No peripheral edema.   SKIN:  Clear with no obvious rashes or skin changes. No nail dyscrasia. NEURO:  Nonfocal. Well oriented.  Appropriate affect.   LAB RESULTS:  CMP     Component Value Date/Time   NA 144 01/09/2018 1040   NA 140 05/29/2017 1254   K 4.4 01/09/2018 1040   K 4.4 05/29/2017 1254   CL 108 01/09/2018 1040   CO2 28 01/09/2018 1040   CO2 25 05/29/2017 1254   GLUCOSE 182 (H) 01/09/2018 1040   GLUCOSE 154 (H) 05/29/2017 1254   BUN 11 01/09/2018 1040   BUN 11.5 05/29/2017 1254   CREATININE 0.95 01/09/2018 1040   CREATININE 0.8 05/29/2017 1254   CALCIUM 9.4 01/09/2018 1040   CALCIUM 9.3 05/29/2017 1254   PROT 6.9 01/09/2018 1040   PROT 7.0 05/29/2017 1254   ALBUMIN 4.1 01/09/2018 1040   ALBUMIN 4.1 05/29/2017 1254   AST 13 (L) 01/09/2018 1040   AST 13 05/29/2017 1254   ALT 13 01/09/2018 1040   ALT 14 05/29/2017 1254   ALKPHOS 67 01/09/2018 1040   ALKPHOS 66 05/29/2017 1254   BILITOT 0.6 01/09/2018 1040   BILITOT 0.50 05/29/2017 1254   GFRNONAA 58 (L) 01/09/2018 1040   GFRAA >60 01/09/2018 1040    INo results found for: SPEP, UPEP  Lab Results  Component Value Date   WBC 2.0 (L) 01/09/2018   NEUTROABS 0.9 (L) 01/09/2018   HGB 11.6 01/09/2018   HCT 33.7 (L) 01/09/2018   MCV 91.8 01/09/2018   PLT 130 (L) 01/09/2018      Chemistry      Component Value Date/Time   NA 144 01/09/2018 1040   NA 140 05/29/2017 1254   K 4.4 01/09/2018 1040   K 4.4 05/29/2017 1254   CL 108 01/09/2018 1040   CO2 28 01/09/2018 1040   CO2 25 05/29/2017 1254   BUN 11 01/09/2018 1040   BUN 11.5 05/29/2017 1254   CREATININE 0.95 01/09/2018 1040   CREATININE  0.8 05/29/2017 1254      Component Value Date/Time   CALCIUM 9.4 01/09/2018 1040   CALCIUM 9.3 05/29/2017 1254   ALKPHOS 67 01/09/2018 1040   ALKPHOS 66 05/29/2017 1254   AST 13 (L) 01/09/2018 1040   AST 13 05/29/2017 1254   ALT 13 01/09/2018 1040   ALT 14 05/29/2017 1254   BILITOT 0.6 01/09/2018 1040   BILITOT 0.50 05/29/2017 1254       No results found for: LABCA2  No components found for: LABCA125  No results for input(s): INR in the last 168 hours.  Urinalysis    Component Value Date/Time   COLORURINE YELLOW 08/18/2017 1322   APPEARANCEUR HAZY (A) 08/18/2017 1322   LABSPEC 1.010 08/18/2017 1322   PHURINE 7.0 08/18/2017 1322   GLUCOSEU NEGATIVE 08/18/2017 1322   GLUCOSEU NEGATIVE 10/30/2014 1513   HGBUR LARGE (A) 08/18/2017 1322   BILIRUBINUR NEGATIVE 08/18/2017 1322   KETONESUR NEGATIVE 08/18/2017 1322   PROTEINUR 30 (A) 08/18/2017 1322   UROBILINOGEN 0.2 10/30/2014 1513   NITRITE NEGATIVE 08/18/2017 1322   LEUKOCYTESUR LARGE (A) 08/18/2017 1322    STUDIES: Restaging Chest CT on 10/17/2017 showed no new or progressive metastatic disease in the chest. Small left lower lobe pulmonary nodule is stable. No new or enlarging pulmonary nodules. No recurrent thoracic nodal metastases.  New suspected postradiation change in the anterior upper right lung. Slightly  decreased asymmetric skin thickening in the right breast compatible with evolving postradiation change. Interval stability of indeterminate 2.7 cm renal cortical mass in the upper right kidney, increased in size since 2017 CT, renal cell carcinoma not excluded. Aortic Atherosclerosis (ICD10-I70.0).  ELIGIBLE FOR AVAILABLE RESEARCH PROTOCOL: no  ASSESSMENT: 71 y.o. [place] woman with a remote history of early stage endometrial cancer, now status post right breast upper outer quadrant biopsy 01/22/2016 for a clinically multifocal T2 N0, stage 2A invasive ductal carcinoma, grade 1, estrogen and progesterone  receptor positive, HER-2 negative, with an MIB-1 between 10 and 15%.  (1) right axillary lymph node biopsy 03/02/2016 positive  (2) genetics testing 01/13/2016 through the Custom gene panel offered by [facility] found no deleterious mutations in  ATM, BARD1, BRCA1, BRCA2, BRIP1, CDH1, CHEK2, EPCAM, FANCC, MLH1, MSH2, MSH6, MUTYH, NBN, PALB2, PMS2, POLD1, PTEN, RAD51C, RAD51D, TP53, and XRCC2  METASTATIC DISEASE: OCT 2017 (3) CT scans of the chest abdomen and pelvis obtained 03/10/2016 are consistent with bilateral lung metastases and mediastinal and hilar nodal involvement, but no liver or bone spread  (a) bronchoscopic lymph node biopsy 2 (station 7, 13R) 03/28/2016 confirms metastatic adenocarcinoma, estrogen receptor positive, HER-2 not amplified  (b) baseline CA-27-29 on 04/18/2016 was 137.5.  (4) letrozole started 03/15/2016, palbociclib added 03/29/2016 at 125 mg/day, 21/7  (a) dose decreased to 100 mg per day, 21/7, beginning with February cycle  (b) palbociclib held 01/31/2017, with increasing symptoms  (c) palbociclib resumed October 2018 at 75 mg daily  (d) palbociclib dose reduced to 75 mg every other day February through April 2019  (e) palbociclib dose resumed at 75 mg daily as of 10/17/2017  (5) status post double right lumpectomies and right axillary lymph node sampling 01/13/2017 for 2 separate invasive ductal carcinoma lesions, pT1a and pT1b, N1a, with negative margins, both lesions being estrogen and progesterone receptor positive and HER-2 negative  (6) adjuvant radiation completed 07/05/2017 1. 50.4 Gy in 28 fractions to the right breast and supraclavicular region using whole-breast tangent fields. 2. Boost to the seroma delivered an additional 10 Gy in 5 fractions. The total dose was 60.4 Gy.  (7) restaging studies:  (a) CT scan of the chest and bone scan 06/23/2017 showed stable scattered very small lung nodules, no bone lesions  (b) CT of the chest 10/17/2017 showed no  new or progressive metastatic disease in the chest. The small left lower lobe pulmonary nodule is stable   (8) right upper pole renal lesion noted to be enlarging on CT scan 06/22/2017   PLAN: [name] is doing well today.  She has no clinical sign of progression.  She continues to tolerate Letrozole and Palbociclib well.  She will continue this. Her ANC is 0.9.  She will go to Palbociclib every other day with this cycle.  I reviewed this with Dr. Lindi Adie and with Inez Catalina in detail.    She will return in 4 weeks for labs and in 8 weeks for labs and evaluation.  Dr. Jana Hakim is planning on restaging with PET scan in 04/2018.  She knows to call for any other issues that may develop before the next visit.  A total of (30) minutes of face-to-face time was spent with this patient with greater than 50% of that time in counseling and care-coordination.    Wilber Bihari, NP  01/09/18 11:55 AM Medical Oncology and Hematology [facility] [address] [place], [place] [address] Tel. [phone]    Fax. [phone]

## 2018-01-09 NOTE — Telephone Encounter (Signed)
Per 7/30 los.  Called patient with added LC appt for 9/24.  Adjusted lab.  Patient aware of new dates/times for appts.

## 2018-01-10 LAB — CANCER ANTIGEN 27.29: CA 27.29: 24.8 U/mL (ref 0.0–38.6)

## 2018-01-13 ENCOUNTER — Ambulatory Visit (HOSPITAL_COMMUNITY)
Admission: RE | Admit: 2018-01-13 | Discharge: 2018-01-13 | Disposition: A | Discharge status: Home / Self Care | Payer: Medicare Other | Source: Ambulatory Visit | Attending: Urology | Admitting: Urology

## 2018-01-13 DIAGNOSIS — K838 Other specified diseases of biliary tract: Secondary | ICD-10-CM | POA: Insufficient documentation

## 2018-01-13 DIAGNOSIS — D49511 Neoplasm of unspecified behavior of right kidney: Secondary | ICD-10-CM | POA: Insufficient documentation

## 2018-01-13 DIAGNOSIS — K760 Fatty (change of) liver, not elsewhere classified: Secondary | ICD-10-CM | POA: Diagnosis not present

## 2018-01-13 DIAGNOSIS — N281 Cyst of kidney, acquired: Secondary | ICD-10-CM | POA: Insufficient documentation

## 2018-01-13 DIAGNOSIS — D4101 Neoplasm of uncertain behavior of right kidney: Secondary | ICD-10-CM

## 2018-01-13 MED ORDER — GADOBENATE DIMEGLUMINE 529 MG/ML IV SOLN
20.0000 mL | Freq: Once | INTRAVENOUS | Status: AC | PRN
  Administered 2018-01-13: 20 mL via INTRAVENOUS

## 2018-01-30 DIAGNOSIS — D49511 Neoplasm of unspecified behavior of right kidney: Secondary | ICD-10-CM | POA: Diagnosis not present

## 2018-02-06 ENCOUNTER — Inpatient Hospital Stay: Payer: Medicare Other | Attending: Oncology

## 2018-02-06 DIAGNOSIS — Z170 Estrogen receptor positive status [ER+]: Secondary | ICD-10-CM | POA: Insufficient documentation

## 2018-02-06 DIAGNOSIS — C50411 Malignant neoplasm of upper-outer quadrant of right female breast: Secondary | ICD-10-CM | POA: Diagnosis not present

## 2018-02-06 DIAGNOSIS — K760 Fatty (change of) liver, not elsewhere classified: Secondary | ICD-10-CM

## 2018-02-06 DIAGNOSIS — Z8542 Personal history of malignant neoplasm of other parts of uterus: Secondary | ICD-10-CM

## 2018-02-06 DIAGNOSIS — C7800 Secondary malignant neoplasm of unspecified lung: Secondary | ICD-10-CM | POA: Diagnosis not present

## 2018-02-06 DIAGNOSIS — C7801 Secondary malignant neoplasm of right lung: Secondary | ICD-10-CM

## 2018-02-06 LAB — COMPREHENSIVE METABOLIC PANEL
ALT: 20 U/L (ref 0–44)
AST: 17 U/L (ref 15–41)
Albumin: 3.7 g/dL (ref 3.5–5.0)
Alkaline Phosphatase: 66 U/L (ref 38–126)
Anion gap: 7 (ref 5–15)
BUN: 14 mg/dL (ref 8–23)
CO2: 28 mmol/L (ref 22–32)
Calcium: 9.1 mg/dL (ref 8.9–10.3)
Chloride: 108 mmol/L (ref 98–111)
Creatinine, Ser: 0.91 mg/dL (ref 0.44–1.00)
GFR calc Af Amer: >60 mL/min (ref >60)
GFR calc non Af Amer: >60 mL/min (ref >60)
Glucose, Bld: 163 mg/dL — ABNORMAL HIGH (ref 70–99)
Potassium: 4.1 mmol/L (ref 3.5–5.1)
Sodium: 143 mmol/L (ref 135–145)
Total Bilirubin: 0.5 mg/dL (ref 0.3–1.2)
Total Protein: 6.5 g/dL (ref 6.5–8.1)

## 2018-02-06 LAB — CBC WITH DIFFERENTIAL/PLATELET
Basophils Absolute: 0 10*3/uL (ref 0.0–0.1)
Basophils Relative: 1 %
Eosinophils Absolute: 0.1 10*3/uL (ref 0.0–0.5)
Eosinophils Relative: 2 %
HCT: 35.2 % (ref 34.8–46.6)
Hemoglobin: 11.8 g/dL (ref 11.6–15.9)
Lymphocytes Relative: 30 %
Lymphs Abs: 0.9 10*3/uL (ref 0.9–3.3)
MCH: 31 pg (ref 25.1–34.0)
MCHC: 33.5 g/dL (ref 31.5–36.0)
MCV: 92.4 fL (ref 79.5–101.0)
Monocytes Absolute: 0.3 10*3/uL (ref 0.1–0.9)
Monocytes Relative: 11 %
Neutro Abs: 1.6 10*3/uL (ref 1.5–6.5)
Neutrophils Relative %: 56 %
Platelets: 147 10*3/uL (ref 145–400)
RBC: 3.81 MIL/uL (ref 3.70–5.45)
RDW: 15 % — ABNORMAL HIGH (ref 11.2–14.5)
WBC: 2.8 10*3/uL — ABNORMAL LOW (ref 3.9–10.3)

## 2018-02-07 ENCOUNTER — Ambulatory Visit: Payer: Self-pay

## 2018-02-07 LAB — CANCER ANTIGEN 27.29: CA 27.29: 24.1 U/mL (ref 0.0–38.6)

## 2018-02-07 NOTE — Telephone Encounter (Signed)
Phone call returned to pt.  Advised that Beta Carotene can be safe to take at certain doses. Encouraged to check with her pharmacist about safe dose that is recommended, and also if it is advised in combination with her current medications.  The pt. Stated she read on the Internet that Beta Carotene can help to lower her blood sugar.  Reported she averages a blood sugar around 140, when checked at home, at different times.  Ques. If this nurse could advise anything she should take to lower blood sugar.  Advised that she should follow the medication recommendations per Dr. Jenny Reichmann, and balance that with following low carb. Diet,  and exercise.  Verb. Understanding.  Agrees with plan.  Stated she will contact her pharmacist re: her questions about Beta Carotene.      Reason for Disposition . Health Information question, no triage required and triager able to answer question  Answer Assessment - Initial Assessment Questions 1. REASON FOR CALL or QUESTION: "What is your reason for calling today?" or "How can I best help you?" or "What question do you have that I can help answer?"     Questions safety of taking Beta Carotene 7500 mcg with current medications.  Protocols used: INFORMATION ONLY CALL-A-AH  Message from [name] sent at 02/07/2018 8:47 AM EDT   Patient wants confirmation that she can take Beta Carotene 7549mcg with the rest of the medications she is taking.

## 2018-02-19 ENCOUNTER — Other Ambulatory Visit: Payer: Self-pay | Admitting: *Deleted

## 2018-02-19 DIAGNOSIS — C50919 Malignant neoplasm of unspecified site of unspecified female breast: Secondary | ICD-10-CM

## 2018-02-19 DIAGNOSIS — C7800 Secondary malignant neoplasm of unspecified lung: Secondary | ICD-10-CM

## 2018-02-19 DIAGNOSIS — Z7189 Other specified counseling: Secondary | ICD-10-CM

## 2018-02-19 DIAGNOSIS — R590 Localized enlarged lymph nodes: Secondary | ICD-10-CM

## 2018-02-19 DIAGNOSIS — C50411 Malignant neoplasm of upper-outer quadrant of right female breast: Secondary | ICD-10-CM

## 2018-02-19 DIAGNOSIS — R918 Other nonspecific abnormal finding of lung field: Secondary | ICD-10-CM

## 2018-02-19 DIAGNOSIS — Z170 Estrogen receptor positive status [ER+]: Secondary | ICD-10-CM

## 2018-02-20 ENCOUNTER — Telehealth: Payer: Self-pay | Admitting: Pharmacist

## 2018-02-20 NOTE — Telephone Encounter (Signed)
Oral Chemotherapy Pharmacist Encounter  Follow-Up Form  Spoke withpatient today to follow up regarding patient's oral chemotherapy medication: Ibrance (palbociclib) for the treatment of hormone receptor positive, metastatic breast cancer, in conjunction with Femara, planned duration until disease progression or unacceptable toxicity  Original Start date of oral chemotherapy:03/29/16  Pt is doing well today  Pt reports0doses ofIbrance 75mg  capsules, 1 capsule by mouth daily with breakfast, taken for 3 weeks on, 1 week off, repeated every 4 weeks,missed in the lastmonth.   Pt reports the following side effects: moderate fatigue, manageable, pushes herself to maintain activity  Pertinent labs reviewed: OK for continued treatment.  Patient states although she is fatigued she really does enjoy being on Ibrance and actually prefers daily dosing with 3 weeks on 1 week off to the every other day dosing. Patient states she has no issues with medication acquisition from the [place]. Confirmed office visit on 03/06/18 with patient.  Patient knows to call the office with questions or concerns. Oral Oncology Clinic will continue to follow.  Johny Drilling, PharmD, BCPS, BCOP  02/20/2018 3:40 PM Oral Oncology Clinic [phone]

## 2018-03-01 DIAGNOSIS — C50911 Malignant neoplasm of unspecified site of right female breast: Secondary | ICD-10-CM | POA: Diagnosis not present

## 2018-03-01 DIAGNOSIS — C7800 Secondary malignant neoplasm of unspecified lung: Secondary | ICD-10-CM | POA: Diagnosis not present

## 2018-03-02 ENCOUNTER — Encounter (HOSPITAL_COMMUNITY)
Admission: RE | Admit: 2018-03-02 | Discharge: 2018-03-02 | Disposition: A | Discharge status: Home / Self Care | Payer: Medicare Other | Source: Ambulatory Visit | Attending: Oncology | Admitting: Oncology

## 2018-03-02 DIAGNOSIS — C50919 Malignant neoplasm of unspecified site of unspecified female breast: Secondary | ICD-10-CM | POA: Diagnosis not present

## 2018-03-02 DIAGNOSIS — C7800 Secondary malignant neoplasm of unspecified lung: Secondary | ICD-10-CM | POA: Diagnosis not present

## 2018-03-02 DIAGNOSIS — Z7189 Other specified counseling: Secondary | ICD-10-CM | POA: Insufficient documentation

## 2018-03-02 DIAGNOSIS — R590 Localized enlarged lymph nodes: Secondary | ICD-10-CM | POA: Diagnosis not present

## 2018-03-02 DIAGNOSIS — R918 Other nonspecific abnormal finding of lung field: Secondary | ICD-10-CM | POA: Diagnosis not present

## 2018-03-02 DIAGNOSIS — C50411 Malignant neoplasm of upper-outer quadrant of right female breast: Secondary | ICD-10-CM | POA: Diagnosis not present

## 2018-03-02 DIAGNOSIS — C50911 Malignant neoplasm of unspecified site of right female breast: Secondary | ICD-10-CM | POA: Diagnosis not present

## 2018-03-02 DIAGNOSIS — Z170 Estrogen receptor positive status [ER+]: Secondary | ICD-10-CM | POA: Insufficient documentation

## 2018-03-02 LAB — GLUCOSE, CAPILLARY: Glucose-Capillary: 125 mg/dL — ABNORMAL HIGH (ref 70–99)

## 2018-03-02 MED ORDER — FLUDEOXYGLUCOSE F - 18 (FDG) INJECTION: 12.5000 | Freq: Once | INTRAVENOUS | Status: DC | PRN

## 2018-03-02 MED FILL — IBRANCE 75 MG CAPSULE: 75 | 28 days supply | Qty: 21 | Fill #6

## 2018-03-05 ENCOUNTER — Ambulatory Visit (HOSPITAL_COMMUNITY): Payer: Medicare Other

## 2018-03-06 ENCOUNTER — Inpatient Hospital Stay (HOSPITAL_BASED_OUTPATIENT_CLINIC_OR_DEPARTMENT_OTHER): Payer: Medicare Other | Admitting: Adult Health

## 2018-03-06 ENCOUNTER — Telehealth: Payer: Self-pay | Admitting: Adult Health

## 2018-03-06 ENCOUNTER — Inpatient Hospital Stay: Payer: Medicare Other | Attending: Oncology

## 2018-03-06 ENCOUNTER — Encounter: Payer: Self-pay | Admitting: Adult Health

## 2018-03-06 VITALS — BP 144/96 | HR 67 | Temp 98.2°F | Resp 18 | Ht 64.0 in | Wt 238.8 lb

## 2018-03-06 DIAGNOSIS — Z9221 Personal history of antineoplastic chemotherapy: Secondary | ICD-10-CM

## 2018-03-06 DIAGNOSIS — Z23 Encounter for immunization: Secondary | ICD-10-CM | POA: Diagnosis not present

## 2018-03-06 DIAGNOSIS — Z853 Personal history of malignant neoplasm of breast: Secondary | ICD-10-CM | POA: Insufficient documentation

## 2018-03-06 DIAGNOSIS — C778 Secondary and unspecified malignant neoplasm of lymph nodes of multiple regions: Secondary | ICD-10-CM | POA: Diagnosis not present

## 2018-03-06 DIAGNOSIS — R0989 Other specified symptoms and signs involving the circulatory and respiratory systems: Secondary | ICD-10-CM

## 2018-03-06 DIAGNOSIS — K760 Fatty (change of) liver, not elsewhere classified: Secondary | ICD-10-CM

## 2018-03-06 DIAGNOSIS — Z170 Estrogen receptor positive status [ER+]: Secondary | ICD-10-CM | POA: Insufficient documentation

## 2018-03-06 DIAGNOSIS — Z8542 Personal history of malignant neoplasm of other parts of uterus: Secondary | ICD-10-CM | POA: Insufficient documentation

## 2018-03-06 DIAGNOSIS — C7802 Secondary malignant neoplasm of left lung: Secondary | ICD-10-CM | POA: Insufficient documentation

## 2018-03-06 DIAGNOSIS — Z923 Personal history of irradiation: Secondary | ICD-10-CM | POA: Diagnosis not present

## 2018-03-06 DIAGNOSIS — C50911 Malignant neoplasm of unspecified site of right female breast: Secondary | ICD-10-CM

## 2018-03-06 DIAGNOSIS — Z90710 Acquired absence of both cervix and uterus: Secondary | ICD-10-CM | POA: Diagnosis not present

## 2018-03-06 DIAGNOSIS — Z79811 Long term (current) use of aromatase inhibitors: Secondary | ICD-10-CM

## 2018-03-06 DIAGNOSIS — R42 Dizziness and giddiness: Secondary | ICD-10-CM | POA: Insufficient documentation

## 2018-03-06 DIAGNOSIS — C50411 Malignant neoplasm of upper-outer quadrant of right female breast: Secondary | ICD-10-CM

## 2018-03-06 DIAGNOSIS — C7801 Secondary malignant neoplasm of right lung: Secondary | ICD-10-CM | POA: Diagnosis not present

## 2018-03-06 LAB — CBC WITH DIFFERENTIAL/PLATELET
Basophils Absolute: 0 10*3/uL (ref 0.0–0.1)
Basophils Relative: 1 %
Eosinophils Absolute: 0 10*3/uL (ref 0.0–0.5)
Eosinophils Relative: 1 %
HCT: 33.4 % — ABNORMAL LOW (ref 34.8–46.6)
Hemoglobin: 11.4 g/dL — ABNORMAL LOW (ref 11.6–15.9)
Lymphocytes Relative: 34 %
Lymphs Abs: 0.6 10*3/uL — ABNORMAL LOW (ref 0.9–3.3)
MCH: 31.7 pg (ref 25.1–34.0)
MCHC: 34.1 g/dL (ref 31.5–36.0)
MCV: 92.8 fL (ref 79.5–101.0)
Monocytes Absolute: 0.3 10*3/uL (ref 0.1–0.9)
Monocytes Relative: 14 %
Neutro Abs: 0.9 10*3/uL — ABNORMAL LOW (ref 1.5–6.5)
Neutrophils Relative %: 50 %
Platelets: 161 10*3/uL (ref 145–400)
RBC: 3.6 MIL/uL — ABNORMAL LOW (ref 3.70–5.45)
RDW: 16.9 % — ABNORMAL HIGH (ref 11.2–14.5)
WBC: 1.9 10*3/uL — ABNORMAL LOW (ref 3.9–10.3)

## 2018-03-06 LAB — COMPREHENSIVE METABOLIC PANEL
ALT: 19 U/L (ref 0–44)
AST: 15 U/L (ref 15–41)
Albumin: 3.9 g/dL (ref 3.5–5.0)
Alkaline Phosphatase: 68 U/L (ref 38–126)
Anion gap: 8 (ref 5–15)
BUN: 13 mg/dL (ref 8–23)
CO2: 26 mmol/L (ref 22–32)
Calcium: 9.2 mg/dL (ref 8.9–10.3)
Chloride: 107 mmol/L (ref 98–111)
Creatinine, Ser: 0.89 mg/dL (ref 0.44–1.00)
GFR calc Af Amer: >60 mL/min (ref >60)
GFR calc non Af Amer: >60 mL/min (ref >60)
Glucose, Bld: 199 mg/dL — ABNORMAL HIGH (ref 70–99)
Potassium: 3.9 mmol/L (ref 3.5–5.1)
Sodium: 141 mmol/L (ref 135–145)
Total Bilirubin: 0.4 mg/dL (ref 0.3–1.2)
Total Protein: 6.9 g/dL (ref 6.5–8.1)

## 2018-03-06 NOTE — Progress Notes (Signed)
[place]  [phone]) [phone] Fax:(336) [phone]     ID: [name] DOB: [birth year]  MR#: [id number]  NIO#:[id number]  Patient Care Team: Shannon Borg, Obrien as PCP - Shannon Born, Obrien as Consulting Physician (General Surgery) [name], Shannon Dad, Obrien as Consulting Physician (Oncology) Kyung Rudd, Obrien as Consulting Physician (Radiation Oncology) Shannon Charleston, Obrien as Consulting Physician (Obstetrics and Gynecology) Shannon Libman, Obrien as Referring Physician (Specialist) Shannon Royals, Obrien as Consulting Physician (Ophthalmology) Shannon Sakai Rose Fillers, Obrien as Consulting Physician (Pulmonary Disease) Shannon Frock, Obrien as Consulting Physician (Urology) OTHER Obrien:  CHIEF COMPLAINT: Estrogen receptor positive breast cancer  CURRENT TREATMENT: Letrozole, palbociclib   INTERVAL HISTORY: [name] returns today for follow-up and treatment of her estrogen receptor positive stage IV breast cancer accompanied by her husband. She continues on letrozole, with good tolerance.   She also continues on palbociclib, currently at 75 mg daily. She started it back yesterday.  She tolerates it well.     REVIEW OF SYSTEMS: [name] is doing well today.  She denies any concerns currently.  She does note that she has some nasal drainage that is clear and an intermittent NP cough that has been going on for the past week.  She is taking Robitussin and flonase for it.  [name] tells me that since her radiation she has experienced intermittent dizziness.  She notes that it has no pattern.  She was evaluated for "ear crystals" and that was negative.  She did this evaluation with her chiropractor.  Otherwise she is doing well.  She denies unusual headaches, vision changes, nausea, vomiting, headaches, bowel/bladder changes or any other issues.  A detailed ROS was otherwise non contributory.    BREAST CANCER HISTORY: From the original intake note:  [name] had screening mammography showing some  suspicious calcifications in the right breast leading to right diagnostic mammography with ultrasonography 01/22/2016 at [facility]. The breast density was category C. In the upper right breast there was a 2.3 cm mass with additional masses measuring 0.9 and 0.7 cm. There was also a possible additional 0.8 mass in the lower inner quadrant. Ultrasound confirmed an irregular hypoechoic mass in the right breast upper outer quadrant measuring 2.0 cm. There were other masses measuring 0.7 and 0.8 cm by ultrasonography. The right axilla was sonographically benign.  Biopsy of a 12:00 and 4:00 mass in the right breast 01/22/2016 showed ([id number]) both specimens showing invasive ductal carcinoma, grade 1 or 2, both 95% estrogen receptor positive, both 95% progesterone receptor positive, both with strong staining intensity, with MIB-1 ranging from 10-15%, and both HER-2 negative, the signals ratio being 1.23-1.42, and the number per cell 1.85-2.59.  Her subsequent history is as detailed below    PAST MEDICAL HISTORY: Past Medical History:  Diagnosis Date  . Breast cancer ([place])   . Cancer ([place]) 02/2016   right breast  . DIABETES MELLITUS, TYPE II 01/04/2007   only takes actoplus daily  . Dizziness and giddiness 02/29/2008  . DVT, HX OF    at age 63 in right buttocks  . Dyspnea    due to lung cancer  . Family history of breast cancer   . GERD 01/04/2007   pt reports resolved   . GLAUCOMA 07/30/2008   both eyes  . History of blood transfusion    no abnormal  reaction  . History of uterine cancer 2000   hysterectomy done  . HYPERLIPIDEMIA 01/04/2007   taking Pravastatin daily  . HYPERTENSION 01/04/2007  takes Lisinopril daily  . Joint pain   . Joint swelling   . Leg cramps   . LEG PAIN, LEFT 07/06/2007  . NUMBNESS 07/30/2008   in fingers;pt states from Diamox  . OSTEOARTHRITIS, HIP 09/25/2009  . OTITIS MEDIA, ACUTE, BILATERAL 02/29/2008  . Overweight(278.02) 01/04/2007  . Peripheral vascular  disease ([place])   . Personal history of radiation therapy 2018  . Pneumonia   . PONV (postoperative nausea and vomiting)   . SLEEP APNEA, OBSTRUCTIVE    doesn't use a cpap;study done about 7yr ago  . TRANSIENT ISCHEMIC ATTACK, HX OF 01/04/2007  . Vision loss    left eye    PAST SURGICAL HISTORY: Past Surgical History:  Procedure Laterality Date  . ABDOMINAL HYSTERECTOMY  2000  . BREAST LUMPECTOMY Right 01/13/2017   x2  . BREAST LUMPECTOMY WITH RADIOACTIVE SEED AND SENTINEL LYMPH NODE BIOPSY Right 01/13/2017   Procedure: RIGHT BREAST RADIOACTIVE SEED X'S 2 GUIDED LUMPECTOMY WITH RADIOACTIVE SEED TARGETED AXILLARYLYMPH NODE EXCISION AND RIGHT AXILLARY SENTINEL LYMPH NODE BIOPSY;  Surgeon: [name];  Location: [name]  Service: General;  Laterality: Right;  2 SEEDS IN RIGHT BREAST 1 SEED IN RIGHT AXILLARY NODE  . CHOLECYSTECTOMY    . ENDOBRONCHIAL ULTRASOUND Bilateral 03/28/2016   Procedure: ENDOBRONCHIAL ULTRASOUND;  Surgeon: RCollene Gobble Obrien;  Location: [facility];  Service: Cardiopulmonary;  Laterality: Bilateral;  . EYE SURGERY  13   shunt left and lazer eye surgery on right cataract and retenia tear with repair  . growth removal  2004   from thumb  . KNEE ARTHROSCOPY Right   . mulitple eye surgeries     both eyes, cataracts with ioc done both eyes  . OOPHORECTOMY    . right lumpectomy with axillary node dissection Right 01/2017  . TOTAL HIP ARTHROPLASTY  06/24/2011   Procedure: TOTAL HIP ARTHROPLASTY;  Surgeon: FKerin Salen  [facility]  Service: Orthopedics;  Laterality: Right;  . TOTAL HIP ARTHROPLASTY Left 11/12/2012   Dr RMayer Camel . TOTAL HIP ARTHROPLASTY Left 11/12/2012   Procedure: TOTAL HIP ARTHROPLASTY;  Surgeon: FKerin Salen Obrien;  [facility] [facility]  Service: Orthopedics;  Laterality: Left;  DEPUY PINNACLE    FAMILY HISTORY Family History  Problem Relation Age of Onset  . Dementia Mother   . Cancer Mother        Breast and lung cancer  . Stroke Sister    . Breast cancer Sister 562 . Heart attack Maternal Aunt   . Lung cancer Maternal Grandmother        non smoker  . Glaucoma Maternal Grandfather   . Anesthesia problems Neg Hx   The patient's father died at age 72 the patient's mother died at age 72 She had breast and lung cancers diagnosed shortly before her death. The patient had no brothers, 2 sisters. One sister was diagnosed with breast cancer at the age of 569  GYNECOLOGIC HISTORY:  No LMP recorded. Patient has had a hysterectomy. Menarche age 72 first live birth age 72 the patient is GX P1. She had a hysterectomy for endometrial cancer in the year 2000. She did not take hormone replacement. She did use oral contraceptives for more than 20 years remotely, with no complications.  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 She is home with her husband [name] He is a retired mDealerTheir son CJuanda Crumblealso lives  in [place].    ADVANCED DIRECTIVES: In place  HEALTH MAINTENANCE: Social History   Tobacco Use  . Smoking status: Never Smoker  . Smokeless tobacco: Never Used  Substance Use Topics  . Alcohol use: No  . Drug use: No     Colonoscopy: Never  PAP: Status post hysterectomy  Bone density: Remote   Allergies  Allergen Reactions  . Codeine Hives    Hycodan syrup  . Fluorescein Nausea And Vomiting    ? IV dye for retina specialist  . Lipitor [Atorvastatin Calcium]     Leg cramp  . Oxycodone Nausea And Vomiting    Patient vomited for 3 days after taking  . Sitagliptin Phosphate Nausea And Vomiting  . Sulfa Drugs Cross Reactors Nausea And Vomiting    Current Outpatient Medications  Medication Sig Dispense Refill  . acetaminophen (TYLENOL) 500 MG tablet Take 1,000 mg by mouth every 4 (four) hours as needed for moderate pain or fever.    Marland Kitchen aspirin EC 81 MG tablet Take 81 mg by mouth daily at 6 PM. 1700    . bimatoprost  (LUMIGAN) 0.01 % SOLN Place 1 drop into both eyes at bedtime.    . brimonidine-timolol (COMBIGAN) 0.2-0.5 % ophthalmic solution Place 1 drop into both eyes three times daily    . cholecalciferol (VITAMIN D) 1000 units tablet Take 1,000 Units by mouth daily.    . dorzolamide-timolol (COSOPT) 22.3-6.8 MG/ML ophthalmic solution Place 1 drop into both eyes 2 (two) times daily.    Marland Kitchen letrozole (FEMARA) 2.5 MG tablet Take 1 tablet (2.5 mg total) by mouth at bedtime. 90 tablet 3  . lisinopril (PRINIVIL,ZESTRIL) 20 MG tablet Take 1 tablet (20 mg total) by mouth daily. 90 tablet 3  . lovastatin (MEVACOR) 20 MG tablet Take 1 tablet (20 mg total) by mouth every evening. 90 tablet 3  . palbociclib (IBRANCE) 75 MG capsule Take 1 capsule (75 mg total) by mouth daily with breakfast. Take whole with food. 21 capsule 6  . pioglitazone-metformin (ACTOPLUS MET) 15-500 MG tablet Take 1 tablet by mouth daily. 90 tablet 3  . valACYclovir (VALTREX) 1000 MG tablet Take 1 tablet (1,000 mg total) by mouth daily. 30 tablet 0   No current facility-administered medications for this visit.    Facility-Administered Medications Ordered in Other Visits  Medication Dose Route Frequency Provider Last Rate Last Dose  . fludeoxyglucose F - 18 (FDG) injection 46.9 millicurie  62.9 millicurie Intravenous Once PRN Gus Height, Obrien         OBJECTIVE:  Vitals:   03/06/18 1023  BP: (!) 144/96  Pulse: 67  Resp: 18  Temp: 98.2 F (36.8 C)     Body mass index is 40.99 kg/m.    ECOG FS:1 - Symptomatic but completely ambulatory Filed Weights   03/06/18 1023  Weight: 238 lb 12.8 oz (108.3 kg)   GENERAL: Patient is a well appearing female in no acute distress HEENT:  Sclerae anicteric.  Oropharynx clear and moist. No ulcerations or evidence of oropharyngeal candidiasis. Neck is supple.  NODES:  No cervical, supraclavicular, or axillary lymphadenopathy palpated.  BREAST EXAM:  Declined, patient was examined last week by Dr.  Lucia Gaskins LUNGS:  Clear to auscultation bilaterally.  No wheezes or rhonchi. HEART:  Regular rate and rhythm. No murmur appreciated. ABDOMEN:  Soft, nontender.  Positive, normoactive bowel sounds. No organomegaly palpated. MSK:  No focal spinal tenderness to palpation. Full range of motion bilaterally in the upper extremities. EXTREMITIES:  No  peripheral edema.   SKIN:  Clear with no obvious rashes or skin changes. No nail dyscrasia. NEURO:  Nonfocal. Well oriented.  Appropriate affect.   LAB RESULTS:  CMP     Component Value Date/Time   NA 141 03/06/2018 0955   NA 140 05/29/2017 1254   K 3.9 03/06/2018 0955   K 4.4 05/29/2017 1254   CL 107 03/06/2018 0955   CO2 26 03/06/2018 0955   CO2 25 05/29/2017 1254   GLUCOSE 199 (H) 03/06/2018 0955   GLUCOSE 154 (H) 05/29/2017 1254   BUN 13 03/06/2018 0955   BUN 11.5 05/29/2017 1254   CREATININE 0.89 03/06/2018 0955   CREATININE 0.8 05/29/2017 1254   CALCIUM 9.2 03/06/2018 0955   CALCIUM 9.3 05/29/2017 1254   PROT 6.9 03/06/2018 0955   PROT 7.0 05/29/2017 1254   ALBUMIN 3.9 03/06/2018 0955   ALBUMIN 4.1 05/29/2017 1254   AST 15 03/06/2018 0955   AST 13 05/29/2017 1254   ALT 19 03/06/2018 0955   ALT 14 05/29/2017 1254   ALKPHOS 68 03/06/2018 0955   ALKPHOS 66 05/29/2017 1254   BILITOT 0.4 03/06/2018 0955   BILITOT 0.50 05/29/2017 1254   GFRNONAA >60 03/06/2018 0955   GFRAA >60 03/06/2018 0955    INo results found for: SPEP, UPEP  Lab Results  Component Value Date   WBC 1.9 (L) 03/06/2018   NEUTROABS 0.9 (L) 03/06/2018   HGB 11.4 (L) 03/06/2018   HCT 33.4 (L) 03/06/2018   MCV 92.8 03/06/2018   PLT 161 03/06/2018      Chemistry      Component Value Date/Time   NA 141 03/06/2018 0955   NA 140 05/29/2017 1254   K 3.9 03/06/2018 0955   K 4.4 05/29/2017 1254   CL 107 03/06/2018 0955   CO2 26 03/06/2018 0955   CO2 25 05/29/2017 1254   BUN 13 03/06/2018 0955   BUN 11.5 05/29/2017 1254   CREATININE 0.89 03/06/2018 0955    CREATININE 0.8 05/29/2017 1254      Component Value Date/Time   CALCIUM 9.2 03/06/2018 0955   CALCIUM 9.3 05/29/2017 1254   ALKPHOS 68 03/06/2018 0955   ALKPHOS 66 05/29/2017 1254   AST 15 03/06/2018 0955   AST 13 05/29/2017 1254   ALT 19 03/06/2018 0955   ALT 14 05/29/2017 1254   BILITOT 0.4 03/06/2018 0955   BILITOT 0.50 05/29/2017 1254       No results found for: LABCA2  No components found for: OQHUT654  No results for input(s): INR in the last 168 hours.  Urinalysis    Component Value Date/Time   COLORURINE YELLOW 08/18/2017 1322   APPEARANCEUR HAZY (A) 08/18/2017 1322   LABSPEC 1.010 08/18/2017 1322   PHURINE 7.0 08/18/2017 1322   GLUCOSEU NEGATIVE 08/18/2017 1322   GLUCOSEU NEGATIVE 10/30/2014 1513   HGBUR LARGE (A) 08/18/2017 1322   BILIRUBINUR NEGATIVE 08/18/2017 1322   KETONESUR NEGATIVE 08/18/2017 1322   PROTEINUR 30 (A) 08/18/2017 1322   UROBILINOGEN 0.2 10/30/2014 1513   NITRITE NEGATIVE 08/18/2017 1322   LEUKOCYTESUR LARGE (A) 08/18/2017 1322    STUDIES: Restaging Chest CT on 10/17/2017 showed no new or progressive metastatic disease in the chest. Small left lower lobe pulmonary nodule is stable. No new or enlarging pulmonary nodules. No recurrent thoracic nodal metastases.  New suspected postradiation change in the anterior upper right lung. Slightly decreased asymmetric skin thickening in the right breast compatible with evolving postradiation change. Interval stability of indeterminate 2.7 cm  renal cortical mass in the upper right kidney, increased in size since 2017 CT, renal cell carcinoma not excluded. Aortic Atherosclerosis (ICD10-I70.0).  ELIGIBLE FOR AVAILABLE RESEARCH PROTOCOL: no  ASSESSMENT: 72 y.o. [place] woman with a remote history of early stage endometrial cancer, now status post right breast upper outer quadrant biopsy 01/22/2016 for a clinically multifocal T2 N0, stage 2A invasive ductal carcinoma, grade 1, estrogen and  progesterone receptor positive, HER-2 negative, with an MIB-1 between 10 and 15%.  (1) right axillary lymph node biopsy 03/02/2016 positive  (2) genetics testing 01/13/2016 through the Custom gene panel offered by [facility] found no deleterious mutations in  ATM, BARD1, BRCA1, BRCA2, BRIP1, CDH1, CHEK2, EPCAM, FANCC, MLH1, MSH2, MSH6, MUTYH, NBN, PALB2, PMS2, POLD1, PTEN, RAD51C, RAD51D, TP53, and XRCC2  METASTATIC DISEASE: OCT 2017 (3) CT scans of the chest abdomen and pelvis obtained 03/10/2016 are consistent with bilateral lung metastases and mediastinal and hilar nodal involvement, but no liver or bone spread  (a) bronchoscopic lymph node biopsy 2 (station 7, 13R) 03/28/2016 confirms metastatic adenocarcinoma, estrogen receptor positive, HER-2 not amplified  (b) baseline CA-27-29 on 04/18/2016 was 137.5.  (4) letrozole started 03/15/2016, palbociclib added 03/29/2016 at 125 mg/day, 21/7  (a) dose decreased to 100 mg per day, 21/7, beginning with February cycle  (b) palbociclib held 01/31/2017, with increasing symptoms  (c) palbociclib resumed October 2018 at 75 mg daily  (d) palbociclib dose reduced to 75 mg every other day February through April 2019  (e) palbociclib dose resumed at 75 mg daily as of 10/17/2017  (5) status post double right lumpectomies and right axillary lymph node sampling 01/13/2017 for 2 separate invasive ductal carcinoma lesions, pT1a and pT1b, N1a, with negative margins, both lesions being estrogen and progesterone receptor positive and HER-2 negative  (6) adjuvant radiation completed 07/05/2017 1. 50.4 Gy in 28 fractions to the right breast and supraclavicular region using whole-breast tangent fields. 2. Boost to the seroma delivered an additional 10 Gy in 5 fractions. The total dose was 60.4 Gy.  (7) restaging studies:  (a) CT scan of the chest and bone scan 06/23/2017 showed stable scattered very small lung nodules, no bone lesions  (b) CT of the chest  10/17/2017 showed no new or progressive metastatic disease in the chest. The small left lower lobe pulmonary nodule is stable  (c) PET scan on 03/02/2018: shows no findings for residual or recurrent right breast cancer   (8) right upper pole renal lesion noted to be enlarging on CT scan 06/22/2017   PLAN: Golda is doing well today.  Her ANC is 0.9.  She is tolerating the Letrozole and Palbociclib well.  We reviewed her PET scan results today which show no evidence of disease!!!  This is very exciting!   She will continue on her current treatment plan with the Letrozole and Palbociclib daily.  I reviewed her labs and plan with Dr. Lindi Adie in detail.  She will return in one month for a lab.  She would like to skip an office visit (so long as she is feeling ok).  This is ok, so long as she has lab call us to notify that she is waiting for her CBC results, and that will give nursing and [place] the opportunity to review the CBC and give her any direction regarding the Palbociclib.    Her symptoms of a cold are consistent with allergies.  She knows if they worsen in any way whatsoever to call and I will send in antibiotics.  She would like note that she does not respond to Zpaks.    Kyannah's dizziness was reviewed.  We reviewed other causes of dizziness other than vertigo such as carotid atherosclerosis, and issues in the brain.  She would like to hold off on any imaging such as carotid dopplers or MRI brain until she sees Dr. Jana Hakim.  Considering how intermittent and occasional her symptoms are, this is reasonable.    She will return in 4 weeks for labs and in 8 weeks for labs and evaluation by Dr. Jana Hakim.  A total of (30) minutes of face-to-face time was spent with this patient with greater than 50% of that time in counseling and care-coordination.    Wilber Bihari, NP  03/06/18 5:38 PM Medical Oncology and Hematology [facility] [address] [place], [place] [address] Tel.  [phone]    Fax. [phone]

## 2018-03-06 NOTE — Telephone Encounter (Signed)
Ave pt avs and calendar

## 2018-03-07 LAB — CANCER ANTIGEN 27.29: CA 27.29: 26 U/mL (ref 0.0–38.6)

## 2018-03-12 ENCOUNTER — Other Ambulatory Visit: Payer: Self-pay | Admitting: Oncology

## 2018-03-14 ENCOUNTER — Other Ambulatory Visit: Payer: Self-pay

## 2018-03-14 DIAGNOSIS — Z170 Estrogen receptor positive status [ER+]: Secondary | ICD-10-CM

## 2018-03-14 DIAGNOSIS — L659 Nonscarring hair loss, unspecified: Secondary | ICD-10-CM

## 2018-03-14 DIAGNOSIS — C50411 Malignant neoplasm of upper-outer quadrant of right female breast: Secondary | ICD-10-CM

## 2018-03-14 NOTE — Progress Notes (Signed)
Pt called in emotional distress about sudden hair loss, depression, anger, and crying over the last 10 days. Pt is taking ibrance and letrozole. Pt states that she has had many chemo treatments before, but never had severe hair loss like she has in the last few days. Pt also under stress because of her insurance being dropped. Pt wants to know what to do, and if her medication is causing all these symptoms all of a sudden. Discussed pt frustration with Dr.Magrinat and arranged for pt to come in on Friday 10/4 to speak and discuss pt symptoms. Offered social services and counseling, but pt would like to wait for her appt with Dr.Magrinat. Confirmed time/date of appt, as well as lab appts to check for thyroid levels and cbc/cmet.

## 2018-03-15 NOTE — Progress Notes (Signed)
[facility]  [phone] [phone]     ID: [name] DOB: [birth year]  MR#: [id number]  JAS#:[id number]  Patient Care Team: Biagio Borg, MD as PCP - Stevan Born, MD as Consulting Physician (General Surgery) Magrinat, Virgie Dad, MD as Consulting Physician (Oncology) Kyung Rudd, MD as Consulting Physician (Radiation Oncology) Bobbye Charleston, MD as Consulting Physician (Obstetrics and Gynecology) Nada Libman, MD as Referring Physician (Specialist) Vevelyn Royals, MD as Consulting Physician (Ophthalmology) Lamonte Sakai Rose Fillers, MD as Consulting Physician (Pulmonary Disease) Alexis Frock, MD as Consulting Physician (Urology) OTHER MD:  CHIEF COMPLAINT: Estrogen receptor positive breast cancer  CURRENT TREATMENT: Letrozole, palbociclib   INTERVAL HISTORY: [name] returns today for follow-up and treatment of her estrogen receptor positive stage IV breast cancer accompanied by her husband. She continues on letrozole, with good tolerance. She denies issues with hot flashes or vaginal dryness.   She also continues on palbociclib, 75 mg daily for 21 days on and 7 days off. She is currently in her second week. She denies issues with diarrhea, nausea, or significant fatigue.  Since her last visit, she completed a PET scan on 03/02/2018 showing: Radiation changes involving the right breast and right chest wall but no findings for residual or recurrent right breast cancer. No locoregional adenopathy or findings for distant metastatic disease. Stable intra and extrahepatic biliary dilatation. Stable small anterior abdominal wall hernias containing fat.   REVIEW OF SYSTEMS: [name] reports that her BP is 144/54. [name] has been thinking about her breast cancer care over the last 2 years. She feels that there have been a few times where she did not receive adequate care, and she let these feelings build up. She notes a time where she was vomiting  and the nurses avoided her, which hurt her feelings. She feels very angry now. She has been walking for exercise, but she is gaining weight due to her diet. She is also sleeping more often throughout the day, and she has trouble sleeping at night. She notes that she can't always control when she sleeps during the day, and she even falls asleep while in mid-conversation with others. She denies unusual headaches, visual changes, nausea, vomiting, or dizziness. There has been no unusual cough, phlegm production, or pleurisy. There has been no change in bowel or bladder habits. She denies unexplained fatigue or unexplained weight loss, bleeding, rash, or fever. A detailed review of systems was otherwise stable.    BREAST CANCER HISTORY: From the original intake note:  [name] had screening mammography showing some suspicious calcifications in the right breast leading to right diagnostic mammography with ultrasonography 01/22/2016 at [facility]. The breast density was category C. In the upper right breast there was a 2.3 cm mass with additional masses measuring 0.9 and 0.7 cm. There was also a possible additional 0.8 mass in the lower inner quadrant. Ultrasound confirmed an irregular hypoechoic mass in the right breast upper outer quadrant measuring 2.0 cm. There were other masses measuring 0.7 and 0.8 cm by ultrasonography. The right axilla was sonographically benign.  Biopsy of a 12:00 and 4:00 mass in the right breast 01/22/2016 showed ([id number]) both specimens showing invasive ductal carcinoma, grade 1 or 2, both 95% estrogen receptor positive, both 95% progesterone receptor positive, both with strong staining intensity, with MIB-1 ranging from 10-15%, and both HER-2 negative, the signals ratio being 1.23-1.42, and the number per cell 1.85-2.59.  Her subsequent history is as detailed below    [place]  HISTORY: Past Medical History:  Diagnosis Date  . Breast cancer ([place])   . Cancer ([place]) 02/2016    right breast  . DIABETES MELLITUS, TYPE II 01/04/2007   only takes actoplus daily  . Dizziness and giddiness 02/29/2008  . DVT, HX OF    at age 89 in right buttocks  . Dyspnea    due to lung cancer  . Family history of breast cancer   . GERD 01/04/2007   pt reports resolved   . GLAUCOMA 07/30/2008   both eyes  . History of blood transfusion    no abnormal  reaction  . History of uterine cancer 2000   hysterectomy done  . HYPERLIPIDEMIA 01/04/2007   taking Pravastatin daily  . HYPERTENSION 01/04/2007   takes Lisinopril daily  . Joint pain   . Joint swelling   . Leg cramps   . LEG PAIN, LEFT 07/06/2007  . NUMBNESS 07/30/2008   in fingers;pt states from Diamox  . OSTEOARTHRITIS, HIP 09/25/2009  . OTITIS MEDIA, ACUTE, BILATERAL 02/29/2008  . Overweight(278.02) 01/04/2007  . Peripheral vascular disease ([place])   . Personal history of radiation therapy 2018  . Pneumonia   . PONV (postoperative nausea and vomiting)   . SLEEP APNEA, OBSTRUCTIVE    doesn't use a cpap;study done about 20yr ago  . TRANSIENT ISCHEMIC ATTACK, HX OF 01/04/2007  . Vision loss    left eye    PAST SURGICAL HISTORY: Past Surgical History:  Procedure Laterality Date  . ABDOMINAL HYSTERECTOMY  2000  . BREAST LUMPECTOMY Right 01/13/2017   x2  . BREAST LUMPECTOMY WITH RADIOACTIVE SEED AND SENTINEL LYMPH NODE BIOPSY Right 01/13/2017   Procedure: RIGHT BREAST RADIOACTIVE SEED X'S 2 GUIDED LUMPECTOMY WITH RADIOACTIVE SEED TARGETED AXILLARYLYMPH NODE EXCISION AND RIGHT AXILLARY SENTINEL LYMPH NODE BIOPSY;  Surgeon: NAlphonsa Overall MD;  Location[facility]  Service: General;  Laterality: Right;  2 SEEDS IN RIGHT BREAST 1 SEED IN RIGHT AXILLARY NODE  . CHOLECYSTECTOMY    . ENDOBRONCHIAL ULTRASOUND Bilateral 03/28/2016   Procedure: ENDOBRONCHIAL ULTRASOUND;  Surgeon: RCollene Gobble MD;  Location: [facility];  Service: Cardiopulmonary;  Laterality: Bilateral;  . EYE SURGERY  13   shunt left and lazer eye surgery on right  cataract and retenia tear with repair  . growth removal  2004   from thumb  . KNEE ARTHROSCOPY Right   . mulitple eye surgeries     both eyes, cataracts with ioc done both eyes  . OOPHORECTOMY    . right lumpectomy with axillary node dissection Right 01/2017  . TOTAL HIP ARTHROPLASTY  06/24/2011   Procedure: TOTAL HIP ARTHROPLASTY;  Surgeon: FKerin Salen  [facility] [facility]  Service: Orthopedics;  Laterality: Right;  . TOTAL HIP ARTHROPLASTY Left 11/12/2012   Dr RMayer Camel . TOTAL HIP ARTHROPLASTY Left 11/12/2012   Procedure: TOTAL HIP ARTHROPLASTY;  Surgeon: FKerin Salen MD;  Location[facility]  Service: Orthopedics;  Laterality: Left;  DEPUY PINNACLE    FAMILY HISTORY Family History  Problem Relation Age of Onset  . Dementia Mother   . Cancer Mother        Breast and lung cancer  . Stroke Sister   . Breast cancer Sister 528 . Heart attack Maternal Aunt   . Lung cancer Maternal Grandmother        non smoker  . Glaucoma Maternal Grandfather   . Anesthesia problems Neg Hx   The patient's father died at age 545 the patient's mother  died at age 31. She had breast and lung cancers diagnosed shortly before her death. The patient had no brothers, 2 sisters. One sister was diagnosed with breast cancer at the age of 76.  GYNECOLOGIC HISTORY:  No LMP recorded. Patient has had a hysterectomy. Menarche age 85, first live birth age 89, the patient is GX P1. She had a hysterectomy for endometrial cancer in the year 2000. She did not take hormone replacement. She did use oral contraceptives for more than 20 years remotely, with no complications.  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 She is home with [name]. He is a retired Dealer.Their son [name] also lives in [place].    ADVANCED DIRECTIVES: In place  HEALTH MAINTENANCE: Social History   Tobacco Use  . Smoking status: Never Smoker  .  Smokeless tobacco: Never Used  Substance Use Topics  . Alcohol use: No  . Drug use: No     Colonoscopy: Never  PAP: Status post hysterectomy  Bone density: Remote   Allergies  Allergen Reactions  . Codeine Hives    Hycodan syrup  . Fluorescein Nausea And Vomiting    ? IV dye for retina specialist  . Lipitor [Atorvastatin Calcium]     Leg cramp  . Oxycodone Nausea And Vomiting    Patient vomited for 3 days after taking  . Sitagliptin Phosphate Nausea And Vomiting  . Sulfa Drugs Cross Reactors Nausea And Vomiting    Current Outpatient Medications  Medication Sig Dispense Refill  . acetaminophen (TYLENOL) 500 MG tablet Take 1,000 mg by mouth every 4 (four) hours as needed for moderate pain or fever.    Marland Kitchen aspirin EC 81 MG tablet Take 81 mg by mouth daily at 6 PM. 1700    . bimatoprost (LUMIGAN) 0.01 % SOLN Place 1 drop into both eyes at bedtime.    . brimonidine-timolol (COMBIGAN) 0.2-0.5 % ophthalmic solution Place 1 drop into both eyes three times daily    . cholecalciferol (VITAMIN D) 1000 units tablet Take 1,000 Units by mouth daily.    . dorzolamide-timolol (COSOPT) 22.3-6.8 MG/ML ophthalmic solution Place 1 drop into both eyes 2 (two) times daily.    Marland Kitchen letrozole (FEMARA) 2.5 MG tablet Take 1 tablet (2.5 mg total) by mouth at bedtime. 90 tablet 3  . lisinopril (PRINIVIL,ZESTRIL) 20 MG tablet Take 1 tablet (20 mg total) by mouth daily. 90 tablet 3  . lovastatin (MEVACOR) 20 MG tablet Take 1 tablet (20 mg total) by mouth every evening. 90 tablet 3  . palbociclib (IBRANCE) 75 MG capsule Take 1 capsule (75 mg total) by mouth daily with breakfast. Take whole with food. 21 capsule 6  . pioglitazone-metformin (ACTOPLUS MET) 15-500 MG tablet Take 1 tablet by mouth daily. 90 tablet 3  . valACYclovir (VALTREX) 1000 MG tablet Take 1 tablet (1,000 mg total) by mouth daily. 30 tablet 0   No current facility-administered medications for this visit.      OBJECTIVE: Middle-aged white woman  in no acute distress Vitals:   03/16/18 1049  BP: (!) 144/56  Pulse: 74  Resp: 18  Temp: 98.8 F (37.1 C)  SpO2: 98%     Body mass index is 41.13 kg/m.    ECOG FS:1 - Symptomatic but completely ambulatory Filed Weights   03/16/18 1049  Weight: 239 lb 9.6 oz (108.7 kg)   Sclerae unicteric, EOMs intact Oropharynx clear and  moist No cervical or supraclavicular adenopathy Lungs no rales or rhonchi Heart regular rate and rhythm Abd soft, nontender, positive bowel sounds MSK no focal spinal tenderness, no upper extremity lymphedema Neuro: nonfocal, well oriented, appropriate affect Breasts: The right breast is status post lumpectomy and radiation.  There is no evidence of local recurrence.  The left breast is benign.  Both axillae are benign.    LAB RESULTS:  CMP     Component Value Date/Time   NA 141 03/06/2018 0955   NA 140 05/29/2017 1254   K 3.9 03/06/2018 0955   K 4.4 05/29/2017 1254   CL 107 03/06/2018 0955   CO2 26 03/06/2018 0955   CO2 25 05/29/2017 1254   GLUCOSE 199 (H) 03/06/2018 0955   GLUCOSE 154 (H) 05/29/2017 1254   BUN 13 03/06/2018 0955   BUN 11.5 05/29/2017 1254   CREATININE 0.89 03/06/2018 0955   CREATININE 0.8 05/29/2017 1254   CALCIUM 9.2 03/06/2018 0955   CALCIUM 9.3 05/29/2017 1254   PROT 6.9 03/06/2018 0955   PROT 7.0 05/29/2017 1254   ALBUMIN 3.9 03/06/2018 0955   ALBUMIN 4.1 05/29/2017 1254   AST 15 03/06/2018 0955   AST 13 05/29/2017 1254   ALT 19 03/06/2018 0955   ALT 14 05/29/2017 1254   ALKPHOS 68 03/06/2018 0955   ALKPHOS 66 05/29/2017 1254   BILITOT 0.4 03/06/2018 0955   BILITOT 0.50 05/29/2017 1254   GFRNONAA >60 03/06/2018 0955   GFRAA >60 03/06/2018 0955    INo results found for: SPEP, UPEP  Lab Results  Component Value Date   WBC 1.8 (L) 03/16/2018   NEUTROABS 1.1 (L) 03/16/2018   HGB 11.4 (L) 03/16/2018   HCT 33.9 (L) 03/16/2018   MCV 93.1 03/16/2018   PLT 235 03/16/2018      Chemistry      Component Value  Date/Time   NA 141 03/06/2018 0955   NA 140 05/29/2017 1254   K 3.9 03/06/2018 0955   K 4.4 05/29/2017 1254   CL 107 03/06/2018 0955   CO2 26 03/06/2018 0955   CO2 25 05/29/2017 1254   BUN 13 03/06/2018 0955   BUN 11.5 05/29/2017 1254   CREATININE 0.89 03/06/2018 0955   CREATININE 0.8 05/29/2017 1254      Component Value Date/Time   CALCIUM 9.2 03/06/2018 0955   CALCIUM 9.3 05/29/2017 1254   ALKPHOS 68 03/06/2018 0955   ALKPHOS 66 05/29/2017 1254   AST 15 03/06/2018 0955   AST 13 05/29/2017 1254   ALT 19 03/06/2018 0955   ALT 14 05/29/2017 1254   BILITOT 0.4 03/06/2018 0955   BILITOT 0.50 05/29/2017 1254       No results found for: LABCA2  No components found for: VEHMC947  No results for input(s): INR in the last 168 hours.  Urinalysis    Component Value Date/Time   COLORURINE YELLOW 08/18/2017 1322   APPEARANCEUR HAZY (A) 08/18/2017 1322   LABSPEC 1.010 08/18/2017 1322   PHURINE 7.0 08/18/2017 1322   GLUCOSEU NEGATIVE 08/18/2017 1322   GLUCOSEU NEGATIVE 10/30/2014 1513   HGBUR LARGE (A) 08/18/2017 1322   BILIRUBINUR NEGATIVE 08/18/2017 1322   KETONESUR NEGATIVE 08/18/2017 1322   PROTEINUR 30 (A) 08/18/2017 1322   UROBILINOGEN 0.2 10/30/2014 1513   NITRITE NEGATIVE 08/18/2017 1322   LEUKOCYTESUR LARGE (A) 08/18/2017 1322    STUDIES: Nm Pet Image Restag (ps) Skull Base To Thigh  Result Date: 03/02/2018 CLINICAL DATA:  Subsequent treatment strategy for breast cancer. EXAM:  NUCLEAR MEDICINE PET SKULL BASE TO THIGH TECHNIQUE: 12.5 mCi F-18 FDG was injected intravenously. Full-ring PET imaging was performed from the skull base to thigh after the radiotracer. CT data was obtained and used for attenuation correction and anatomic localization. Fasting blood glucose: 125 mg/dl COMPARISON:  Chest CT 10/17/2017 and MRI abdomen 01/13/2018 FINDINGS: Mediastinal blood pool activity: SUV max 3.25 NECK: No hypermetabolic lymph nodes in the neck. Incidental CT findings: none  CHEST: Skin thickening and mild hypermetabolism involving the right breast likely related to radiation. There are also diffuse interstitial changes consistent with radiation and there are remote postsurgical changes. No findings suspicious for recurrent tumor or chest wall tumor. No axillary or supraclavicular adenopathy. No enlarged or hypermetabolic mediastinal or hilar lymph nodes. No worrisome pulmonary lesions or hypermetabolism to suggest pulmonary metastatic disease. Incidental CT findings: none ABDOMEN/PELVIS: No abnormal hypermetabolic activity within the liver, pancreas, adrenal glands, or spleen. No hypermetabolic lymph nodes in the abdomen or pelvis. Incidental CT findings: Stable intra and extrahepatic biliary dilatation likely due to prior cholecystectomy. Stable advanced atherosclerotic calcifications involving the aorta iliac arteries. Small anterior abdominal wall hernias containing fat. SKELETON: No significant findings. No evidence of osseous metastatic disease. Bilateral hip prostheses are noted without complicating features Incidental CT findings: none IMPRESSION: 1. Radiation changes involving the right breast and right chest wall but no findings for residual or recurrent right breast cancer. No locoregional adenopathy or findings for distant metastatic disease. 2. Stable intra and extrahepatic biliary dilatation. Stable small anterior abdominal wall hernias containing fat. Electronically Signed   By: Marijo Sanes M.D.   On: 03/02/2018 15:47    ELIGIBLE FOR AVAILABLE RESEARCH PROTOCOL: no  ASSESSMENT: 72 y.o. [place] woman with a remote history of early stage endometrial cancer, now status post right breast upper outer quadrant biopsy 01/22/2016 for a clinically multifocal T2 N0, stage 2A invasive ductal carcinoma, grade 1, estrogen and progesterone receptor positive, HER-2 negative, with an MIB-1 between 10 and 15%.  (1) right axillary lymph node biopsy 03/02/2016  positive  (2) genetics testing 01/13/2016 through the Custom gene panel offered by [facility] found no deleterious mutations in  ATM, BARD1, BRCA1, BRCA2, BRIP1, CDH1, CHEK2, EPCAM, FANCC, MLH1, MSH2, MSH6, MUTYH, NBN, PALB2, PMS2, POLD1, PTEN, RAD51C, RAD51D, TP53, and XRCC2  METASTATIC DISEASE: OCT 2017 (3) CT scans of the chest abdomen and pelvis obtained 03/10/2016 are consistent with bilateral lung metastases and mediastinal and hilar nodal involvement, but no liver or bone spread  (a) bronchoscopic lymph node biopsy 2 (station 7, 13R) 03/28/2016 confirms metastatic adenocarcinoma, estrogen receptor positive, HER-2 not amplified  (b) baseline CA-27-29 on 04/18/2016 was 137.5.  (4) letrozole started 03/15/2016, palbociclib added 03/29/2016 at 125 mg/day, 21/7  (a) dose decreased to 100 mg per day, 21/7, beginning with February cycle  (b) palbociclib held 01/31/2017, with increasing symptoms  (c) palbociclib resumed October 2018 at 75 mg daily  (d) palbociclib dose reduced to 75 mg every other day February through April 2019  (e) palbociclib dose resumed at 75 mg daily as of 10/17/2017  (5) status post double right lumpectomies and right axillary lymph node sampling 01/13/2017 for 2 separate invasive ductal carcinoma lesions, pT1a and pT1b, N1a, with negative margins, both lesions being estrogen and progesterone receptor positive and HER-2 negative  (6) adjuvant radiation completed 07/05/2017 1. 50.4 Gy in 28 fractions to the right breast and supraclavicular region using whole-breast tangent fields. 2. Boost to the seroma delivered an additional 10 Gy in 5 fractions.  The total dose was 60.4 Gy.  (7) restaging studies:  (a) CT scan of the chest and bone scan 06/23/2017 showed stable scattered very small lung nodules, no bone lesions  (b) CT of the chest 10/17/2017 showed no new or progressive metastatic disease in the chest. The small left lower lobe pulmonary nodule is stable  (c) PET scan  on 03/02/2018: shows no findings for residual or recurrent right breast cancer   (8) right upper pole renal lesion noted to be enlarging on CT scan 06/22/2017  (a) no uptake on PET scan obtained 03/02/2018   PLAN: [name] is now just over a year out from definitive surgery for her breast cancer.  She is in clinical remission, with a PET scan showing no evidence of disease activity.  This is wonderful news.  It is also confusing for her.  She was not sure if she had cancer anymore, or whether she was cured.  We reviewed the fact that as far as we can tell stage IV breast cancer is not curable and that while it is in remission now it is likely to come out of remission at some point and at that point we would change her to a different treatment.  She is showing fairly classic symptoms of posttraumatic stress and depression.  We discussed this at length today and I am starting her on venlafaxine.  We discussed the possible toxicity side effects and complications of that agent and I went ahead and placed the prescription for her.  I am going to see her again in about 3 weeks just to make sure she is tolerating it well and also to consider upping the dose of venlafaxine  Otherwise the plan is to continue the letrozole and palbociclib indefinitely until there is definite evidence of disease progression  She knows to call for any other issues that may develop before the next visit.     Magrinat, Virgie Dad, MD  03/16/18 11:23 AM Medical Oncology and Hematology [facility] [address] [place], [place] [address] Tel. [phone]    Fax. [phone]  Alice Rieger, am acting as scribe for Chauncey Cruel MD.  I, Lurline Del MD, have reviewed the above documentation for accuracy and completeness, and I agree with the above.

## 2018-03-16 ENCOUNTER — Inpatient Hospital Stay: Payer: Medicare Other

## 2018-03-16 ENCOUNTER — Inpatient Hospital Stay: Payer: Medicare Other | Attending: Oncology | Admitting: Oncology

## 2018-03-16 ENCOUNTER — Telehealth: Payer: Self-pay | Admitting: Oncology

## 2018-03-16 VITALS — BP 144/56 | HR 74 | Temp 98.8°F | Resp 18 | Ht 64.0 in | Wt 239.6 lb

## 2018-03-16 DIAGNOSIS — Z8542 Personal history of malignant neoplasm of other parts of uterus: Secondary | ICD-10-CM | POA: Diagnosis not present

## 2018-03-16 DIAGNOSIS — Z90710 Acquired absence of both cervix and uterus: Secondary | ICD-10-CM

## 2018-03-16 DIAGNOSIS — Z923 Personal history of irradiation: Secondary | ICD-10-CM | POA: Diagnosis not present

## 2018-03-16 DIAGNOSIS — C7801 Secondary malignant neoplasm of right lung: Secondary | ICD-10-CM

## 2018-03-16 DIAGNOSIS — Z79811 Long term (current) use of aromatase inhibitors: Secondary | ICD-10-CM | POA: Diagnosis not present

## 2018-03-16 DIAGNOSIS — Z7984 Long term (current) use of oral hypoglycemic drugs: Secondary | ICD-10-CM | POA: Diagnosis not present

## 2018-03-16 DIAGNOSIS — C50411 Malignant neoplasm of upper-outer quadrant of right female breast: Secondary | ICD-10-CM

## 2018-03-16 DIAGNOSIS — C7800 Secondary malignant neoplasm of unspecified lung: Secondary | ICD-10-CM

## 2018-03-16 DIAGNOSIS — Z803 Family history of malignant neoplasm of breast: Secondary | ICD-10-CM

## 2018-03-16 DIAGNOSIS — Z7982 Long term (current) use of aspirin: Secondary | ICD-10-CM | POA: Diagnosis not present

## 2018-03-16 DIAGNOSIS — Z801 Family history of malignant neoplasm of trachea, bronchus and lung: Secondary | ICD-10-CM | POA: Insufficient documentation

## 2018-03-16 DIAGNOSIS — F3289 Other specified depressive episodes: Secondary | ICD-10-CM | POA: Insufficient documentation

## 2018-03-16 DIAGNOSIS — Z79899 Other long term (current) drug therapy: Secondary | ICD-10-CM

## 2018-03-16 DIAGNOSIS — Z170 Estrogen receptor positive status [ER+]: Secondary | ICD-10-CM

## 2018-03-16 LAB — CBC WITH DIFFERENTIAL/PLATELET
Basophils Absolute: 0 10*3/uL (ref 0.0–0.1)
Basophils Relative: 1 %
Eosinophils Absolute: 0.1 10*3/uL (ref 0.0–0.5)
Eosinophils Relative: 3 %
HCT: 33.9 % — ABNORMAL LOW (ref 34.8–46.6)
Hemoglobin: 11.4 g/dL — ABNORMAL LOW (ref 11.6–15.9)
Lymphocytes Relative: 32 %
Lymphs Abs: 0.6 10*3/uL — ABNORMAL LOW (ref 0.9–3.3)
MCH: 31.3 pg (ref 25.1–34.0)
MCHC: 33.6 g/dL (ref 31.5–36.0)
MCV: 93.1 fL (ref 79.5–101.0)
Monocytes Absolute: 0.1 10*3/uL (ref 0.1–0.9)
Monocytes Relative: 7 %
Neutro Abs: 1.1 10*3/uL — ABNORMAL LOW (ref 1.5–6.5)
Neutrophils Relative %: 57 %
Platelets: 235 10*3/uL (ref 145–400)
RBC: 3.64 MIL/uL — ABNORMAL LOW (ref 3.70–5.45)
RDW: 15.3 % — ABNORMAL HIGH (ref 11.2–14.5)
WBC: 1.8 10*3/uL — ABNORMAL LOW (ref 3.9–10.3)

## 2018-03-16 MED ORDER — VENLAFAXINE HCL ER 75 MG PO CP24
75.0000 mg | ORAL_CAPSULE | Freq: Every day | ORAL | 6 refills | Status: DC
Start: 2018-03-16 — End: 2018-04-30

## 2018-03-16 NOTE — Telephone Encounter (Signed)
Gave pt avs and calendar  °

## 2018-03-19 ENCOUNTER — Telehealth: Payer: Self-pay | Admitting: *Deleted

## 2018-03-19 NOTE — Telephone Encounter (Signed)
This RN spoke with pt per her VM stating she is unable to take the venlafaxine due to onset of nausea and vomiting post taking the medication on Saturday.  She began vomiting Saturday evening - with on set of nausea. She had further vomiting on Sunday after taking her second dose of venlafaxine.  She states " if I eat 24 x 7 that helps the nausea but not the vomiting "  [name] states she understands " why I need this type of medication and know there may be another type I can take "  Pt denies any thoughts of harm to self or others   " now that I know why I am feeling this way - really helps because I know what it is and that helps me have control "  Pt will stop the venlafaxine at this time.  This note will be given to MD for further recommendations.  Pt is presently scheduled for visit 10/21.

## 2018-03-21 ENCOUNTER — Other Ambulatory Visit: Payer: Self-pay

## 2018-03-21 MED ORDER — BUPROPION HCL ER (XL) 150 MG PO TB24: 150.0000 mg | ORAL_TABLET | Freq: Every day | ORAL | 0 refills | Status: DC

## 2018-03-23 ENCOUNTER — Other Ambulatory Visit: Payer: Self-pay | Admitting: Oncology

## 2018-03-23 DIAGNOSIS — C7800 Secondary malignant neoplasm of unspecified lung: Secondary | ICD-10-CM

## 2018-03-23 DIAGNOSIS — Z170 Estrogen receptor positive status [ER+]: Secondary | ICD-10-CM

## 2018-03-23 DIAGNOSIS — C50411 Malignant neoplasm of upper-outer quadrant of right female breast: Secondary | ICD-10-CM

## 2018-03-26 DIAGNOSIS — Z961 Presence of intraocular lens: Secondary | ICD-10-CM | POA: Diagnosis not present

## 2018-03-26 DIAGNOSIS — H353132 Nonexudative age-related macular degeneration, bilateral, intermediate dry stage: Secondary | ICD-10-CM | POA: Diagnosis not present

## 2018-03-26 DIAGNOSIS — H401134 Primary open-angle glaucoma, bilateral, indeterminate stage: Secondary | ICD-10-CM | POA: Diagnosis not present

## 2018-03-27 MED FILL — IBRANCE 75 MG CAPSULE: 75 | 28 days supply | Qty: 21 | Fill #0

## 2018-04-02 ENCOUNTER — Inpatient Hospital Stay: Payer: Medicare Other

## 2018-04-02 DIAGNOSIS — Z923 Personal history of irradiation: Secondary | ICD-10-CM | POA: Diagnosis not present

## 2018-04-02 DIAGNOSIS — Z79811 Long term (current) use of aromatase inhibitors: Secondary | ICD-10-CM | POA: Diagnosis not present

## 2018-04-02 DIAGNOSIS — C7801 Secondary malignant neoplasm of right lung: Secondary | ICD-10-CM

## 2018-04-02 DIAGNOSIS — F3289 Other specified depressive episodes: Secondary | ICD-10-CM | POA: Diagnosis not present

## 2018-04-02 DIAGNOSIS — Z8542 Personal history of malignant neoplasm of other parts of uterus: Secondary | ICD-10-CM | POA: Diagnosis not present

## 2018-04-02 DIAGNOSIS — Z170 Estrogen receptor positive status [ER+]: Secondary | ICD-10-CM | POA: Diagnosis not present

## 2018-04-02 DIAGNOSIS — C50411 Malignant neoplasm of upper-outer quadrant of right female breast: Secondary | ICD-10-CM | POA: Diagnosis not present

## 2018-04-02 DIAGNOSIS — L659 Nonscarring hair loss, unspecified: Secondary | ICD-10-CM

## 2018-04-02 LAB — CBC WITH DIFFERENTIAL (CANCER CENTER ONLY)
Abs Immature Granulocytes: 0.01 10*3/uL (ref 0.00–0.07)
Basophils Absolute: 0 10*3/uL (ref 0.0–0.1)
Basophils Relative: 0 %
Eosinophils Absolute: 0 10*3/uL (ref 0.0–0.5)
Eosinophils Relative: 1 %
HCT: 33 % — ABNORMAL LOW (ref 36.0–46.0)
Hemoglobin: 11.3 g/dL — ABNORMAL LOW (ref 12.0–15.0)
Immature Granulocytes: 0 %
Lymphocytes Relative: 30 %
Lymphs Abs: 0.7 10*3/uL (ref 0.7–4.0)
MCH: 31.9 pg (ref 26.0–34.0)
MCHC: 34.2 g/dL (ref 30.0–36.0)
MCV: 93.2 fL (ref 80.0–100.0)
Monocytes Absolute: 0.3 10*3/uL (ref 0.1–1.0)
Monocytes Relative: 14 %
Neutro Abs: 1.3 10*3/uL — ABNORMAL LOW (ref 1.7–7.7)
Neutrophils Relative %: 55 %
Platelet Count: 154 10*3/uL (ref 150–400)
RBC: 3.54 MIL/uL — ABNORMAL LOW (ref 3.87–5.11)
RDW: 15.6 % — ABNORMAL HIGH (ref 11.5–15.5)
WBC Count: 2.4 10*3/uL — ABNORMAL LOW (ref 4.0–10.5)
nRBC: 0 % (ref 0.0–0.2)

## 2018-04-02 LAB — CMP (CANCER CENTER ONLY)
ALT: 15 U/L (ref 0–44)
AST: 13 U/L — ABNORMAL LOW (ref 15–41)
Albumin: 3.6 g/dL (ref 3.5–5.0)
Alkaline Phosphatase: 67 U/L (ref 38–126)
Anion gap: 8 (ref 5–15)
BUN: 12 mg/dL (ref 8–23)
CO2: 23 mmol/L (ref 22–32)
Calcium: 9.1 mg/dL (ref 8.9–10.3)
Chloride: 108 mmol/L (ref 98–111)
Creatinine: 0.8 mg/dL (ref 0.44–1.00)
GFR, Est AFR Am: >60 mL/min (ref >60)
GFR, Estimated: >60 mL/min (ref >60)
Glucose, Bld: 219 mg/dL — ABNORMAL HIGH (ref 70–99)
Potassium: 4.2 mmol/L (ref 3.5–5.1)
Sodium: 139 mmol/L (ref 135–145)
Total Bilirubin: 0.4 mg/dL (ref 0.3–1.2)
Total Protein: 6.5 g/dL (ref 6.5–8.1)

## 2018-04-02 LAB — TSH: TSH: 0.746 u[IU]/mL (ref 0.308–3.960)

## 2018-04-02 LAB — T4, FREE: Free T4: 0.87 ng/dL (ref 0.82–1.77)

## 2018-04-03 ENCOUNTER — Telehealth: Payer: Self-pay | Admitting: *Deleted

## 2018-04-03 ENCOUNTER — Other Ambulatory Visit: Payer: Self-pay | Admitting: *Deleted

## 2018-04-03 ENCOUNTER — Other Ambulatory Visit: Payer: Medicare Other

## 2018-04-03 LAB — CANCER ANTIGEN 27.29: CA 27.29: 27 U/mL (ref 0.0–38.6)

## 2018-04-03 MED ORDER — CEPHALEXIN 500 MG PO CAPS: 500.0000 mg | ORAL_CAPSULE | Freq: Two times a day (BID) | ORAL | 0 refills | Status: DC

## 2018-04-03 NOTE — Telephone Encounter (Signed)
Received incomplete vm call from pt stating that she has had a cold 4 1/2 days.  Returned call & she states sometimes dry sounding & sometimes wet sounding cough.  There is some mucous but no color & mucous makes her sick on her stomach. She denies fever.  She has taken tylenol every 5 hours & took 2 aspirin this am.  She states from 3 am to 2 pm she has constantly coughed.  She feels that she has chest congestion but has a little runny nose.  She has taken a whole bottle of robitussin since Sat with no relief.  She reports not sleeping well.  She states she has to be careful with meds due to glaucoma & macular degeneration.  Message to Dr Jana Hakim. She is on letrozole & Ibrance.  Discussed with Dr Jana Hakim & he suggested to hold Ibrance & take keflex 500mg  BID x 5 days & call us back on Friday with update.  Informed pt.  She didn't want script for phenerganwith codeine for cough. ATB script sent to [facility].

## 2018-04-06 ENCOUNTER — Telehealth: Payer: Self-pay | Admitting: *Deleted

## 2018-04-06 NOTE — Telephone Encounter (Signed)
This RN spoke with pt per request to call per onset of chest congestion.   [name] states since starting the antibiotic she feels mildly better - but the cough has not improved.   She is using Robitussin DM with some benefit but " just get all gunked up and it like sticks in my throat "  Pt is sleeping in the recliner so she can keep her chest elevated.  This RN recommended for pt to obtain Muccinex DM ( she has used previously with benefit ), and to continue to hold her Ibrance.  [name] will call on Monday with update and any additional needs.

## 2018-04-23 ENCOUNTER — Ambulatory Visit: Payer: Medicare Other | Admitting: Oncology

## 2018-04-28 NOTE — Progress Notes (Signed)
[place]  [phone] [phone]     ID: [name] DOB: [birth year]  MR#: [id number]  UUV#:[id number]  Patient Care Team: Biagio Borg, MD as PCP - Stevan Born, MD as Consulting Physician (General Surgery) Lacole Komorowski, Virgie Dad, MD as Consulting Physician (Oncology) Kyung Rudd, MD as Consulting Physician (Radiation Oncology) Bobbye Charleston, MD as Consulting Physician (Obstetrics and Gynecology) Nada Libman, MD as Referring Physician (Specialist) Vevelyn Royals, MD as Consulting Physician (Ophthalmology) Lamonte Sakai Rose Fillers, MD as Consulting Physician (Pulmonary Disease) Alexis Frock, MD as Consulting Physician (Urology) OTHER MD:  CHIEF COMPLAINT: Estrogen receptor positive breast cancer  CURRENT TREATMENT: Letrozole, palbociclib   INTERVAL HISTORY: [name] returns today for follow-up and treatment of her estrogen receptor positive stage IV breast cancer accompanied by her husband. She continues on letrozole with no side effects or hot flashes.   She also continues on palbociclib, her last one was Saturday 04/28/18, she feels no side effects, she gets a little bit tired but with on other sidee effects such as fever and headaches.  Since her last visit, she completed a PET scan on 03/02/2018 showing: Radiation changes involving the right breast and right chest wall but no findings for residual or recurrent right breast cancer. No locoregional adenopathy or findings for distant metastatic disease. Stable intra and extrahepatic biliary dilatation. Stable small anterior abdominal wall hernias containing fat.   REVIEW OF SYSTEMS: [name] reports that for exercise, none. Has had a cough for 4 weeks, she is tired, she can't sleep, can only sleep if she takes "enough medication" such  Tylenol, robitussin. She had light fever and coughs up phlegm. She denies unusual headaches, visual changes, nausea, vomiting, or dizziness. There has  been no pleurisy. This been no change in bowel or bladder habits. She denies unexplained weight loss, bleeding, rash. A detailed review of systems was otherwise stable.    BREAST CANCER HISTORY: From the original intake note:  [name] had screening mammography showing some suspicious calcifications in the right breast leading to right diagnostic mammography with ultrasonography 01/22/2016 at [facility]. The breast density was category C. In the upper right breast there was a 2.3 cm mass with additional masses measuring 0.9 and 0.7 cm. There was also a possible additional 0.8 mass in the lower inner quadrant. Ultrasound confirmed an irregular hypoechoic mass in the right breast upper outer quadrant measuring 2.0 cm. There were other masses measuring 0.7 and 0.8 cm by ultrasonography. The right axilla was sonographically benign.  Biopsy of a 12:00 and 4:00 mass in the right breast 01/22/2016 showed ([id number]) both specimens showing invasive ductal carcinoma, grade 1 or 2, both 95% estrogen receptor positive, both 95% progesterone receptor positive, both with strong staining intensity, with MIB-1 ranging from 10-15%, and both HER-2 negative, the signals ratio being 1.23-1.42, and the number per cell 1.85-2.59.  Her subsequent history is as detailed below    PAST MEDICAL HISTORY: Past Medical History:  Diagnosis Date  . Breast cancer ([place])   . Cancer ([place]) 02/2016   right breast  . DIABETES MELLITUS, TYPE II 01/04/2007   only takes actoplus daily  . Dizziness and giddiness 02/29/2008  . DVT, HX OF    at age 4 in right buttocks  . Dyspnea    due to lung cancer  . Family history of breast cancer   . GERD 01/04/2007   pt reports resolved   . GLAUCOMA 07/30/2008   both eyes  . History of blood  transfusion    no abnormal  reaction  . History of uterine cancer 2000   hysterectomy done  . HYPERLIPIDEMIA 01/04/2007   taking Pravastatin daily  . HYPERTENSION 01/04/2007   takes Lisinopril daily  .  Joint pain   . Joint swelling   . Leg cramps   . LEG PAIN, LEFT 07/06/2007  . NUMBNESS 07/30/2008   in fingers;pt states from Diamox  . OSTEOARTHRITIS, HIP 09/25/2009  . OTITIS MEDIA, ACUTE, BILATERAL 02/29/2008  . Overweight(278.02) 01/04/2007  . Peripheral vascular disease ([place])   . Personal history of radiation therapy 2018  . Pneumonia   . PONV (postoperative nausea and vomiting)   . SLEEP APNEA, OBSTRUCTIVE    doesn't use a cpap;study done about 68yr ago  . TRANSIENT ISCHEMIC ATTACK, HX OF 01/04/2007  . Vision loss    left eye    PAST SURGICAL HISTORY: Past Surgical History:  Procedure Laterality Date  . ABDOMINAL HYSTERECTOMY  2000  . BREAST LUMPECTOMY Right 01/13/2017   x2  . BREAST LUMPECTOMY WITH RADIOACTIVE SEED AND SENTINEL LYMPH NODE BIOPSY Right 01/13/2017   Procedure: RIGHT BREAST RADIOACTIVE SEED X'S 2 GUIDED LUMPECTOMY WITH RADIOACTIVE SEED TARGETED AXILLARYLYMPH NODE EXCISION AND RIGHT AXILLARY SENTINEL LYMPH NODE BIOPSY;  Surgeon: NAlphonsa Overall MD;  Location[facility]  Service: General;  Laterality: Right;  2 SEEDS IN RIGHT BREAST 1 SEED IN RIGHT AXILLARY NODE  . CHOLECYSTECTOMY    . ENDOBRONCHIAL ULTRASOUND Bilateral 03/28/2016   Procedure: ENDOBRONCHIAL ULTRASOUND;  Surgeon: RCollene Gobble MD;  Location: [facility];  Service: Cardiopulmonary;  Laterality: Bilateral;  . EYE SURGERY  13   shunt left and lazer eye surgery on right cataract and retenia tear with repair  . growth removal  2004   from thumb  . KNEE ARTHROSCOPY Right   . mulitple eye surgeries     both eyes, cataracts with ioc done both eyes  . OOPHORECTOMY    . right lumpectomy with axillary node dissection Right 01/2017  . TOTAL HIP ARTHROPLASTY  06/24/2011   Procedure: TOTAL HIP ARTHROPLASTY;  Surgeon: FKerin Salen  [facility]  Service: Orthopedics;  Laterality: Right;  . TOTAL HIP ARTHROPLASTY Left 11/12/2012   Dr RMayer Camel . TOTAL HIP ARTHROPLASTY Left 11/12/2012   Procedure: TOTAL HIP  ARTHROPLASTY;  Surgeon: FKerin Salen MD;  [facility]  Service: Orthopedics;  Laterality: Left;  DEPUY PINNACLE    FAMILY HISTORY Family History  Problem Relation Age of Onset  . Dementia Mother   . Cancer Mother        Breast and lung cancer  . Stroke Sister   . Breast cancer Sister 572 . Heart attack Maternal Aunt   . Lung cancer Maternal Grandmother        non smoker  . Glaucoma Maternal Grandfather   . Anesthesia problems Neg Hx   The patient's father died at age 72 the patient's mother died at age 72 She had breast and lung cancers diagnosed shortly before her death. The patient had no brothers, 2 sisters. One sister was diagnosed with breast cancer at the age of 579  GYNECOLOGIC HISTORY:  No LMP recorded. Patient has had a hysterectomy. Menarche age 72 first live birth age 89 the patient is GX P1. She had a hysterectomy for endometrial cancer in the year 2000. She did not take hormone replacement. She did use oral contraceptives for more than 20 years remotely, with no complications.  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 She is home with her husband [name]. He is a retired Dealer.Their son [name] also lives in [place].    ADVANCED DIRECTIVES: In place  HEALTH MAINTENANCE: Social History   Tobacco Use  . Smoking status: Never Smoker  . Smokeless tobacco: Never Used  Substance Use Topics  . Alcohol use: No  . Drug use: No     Colonoscopy: Never  PAP: Status post hysterectomy  Bone density: Remote   Allergies  Allergen Reactions  . Codeine Hives    Hycodan syrup  . Fluorescein Nausea And Vomiting    ? IV dye for retina specialist  . Lipitor [Atorvastatin Calcium]     Leg cramp  . Oxycodone Nausea And Vomiting    Patient vomited for 3 days after taking  . Sitagliptin Phosphate Nausea And Vomiting  . Sulfa Drugs Cross Reactors Nausea And Vomiting     Current Outpatient Medications  Medication Sig Dispense Refill  . acetaminophen (TYLENOL) 500 MG tablet Take 1,000 mg by mouth every 4 (four) hours as needed for moderate pain or fever.    Marland Kitchen albuterol (PROVENTIL HFA;VENTOLIN HFA) 108 (90 Base) MCG/ACT inhaler Inhale 2 puffs into the lungs every 6 (six) hours as needed for wheezing or shortness of breath. Please provide spacer to pt 1 Inhaler 2  . aspirin EC 81 MG tablet Take 81 mg by mouth daily at 6 PM. 1700    . bimatoprost (LUMIGAN) 0.01 % SOLN Place 1 drop into both eyes at bedtime.    . brimonidine-timolol (COMBIGAN) 0.2-0.5 % ophthalmic solution Place 1 drop into both eyes three times daily    . cholecalciferol (VITAMIN D) 1000 units tablet Take 1,000 Units by mouth daily.    . dorzolamide-timolol (COSOPT) 22.3-6.8 MG/ML ophthalmic solution Place 1 drop into both eyes 2 (two) times daily.    [name] 75 MG capsule TAKE 1 CAPSULE (75 MG TOTAL) BY MOUTH DAILY WITH BREAKFAST. TAKE WHOLE WITH FOOD. 21 capsule 6  . letrozole (FEMARA) 2.5 MG tablet Take 1 tablet (2.5 mg total) by mouth at bedtime. 90 tablet 3  . lisinopril (PRINIVIL,ZESTRIL) 20 MG tablet Take 1 tablet (20 mg total) by mouth daily. 90 tablet 3  . lovastatin (MEVACOR) 20 MG tablet Take 1 tablet (20 mg total) by mouth every evening. 90 tablet 3  . palbociclib (IBRANCE) 75 MG capsule Take 1 capsule (75 mg total) by mouth daily with breakfast. Take whole with food. 21 capsule 6  . pioglitazone-metformin (ACTOPLUS MET) 15-500 MG tablet Take 1 tablet by mouth daily. 90 tablet 3  . valACYclovir (VALTREX) 1000 MG tablet Take 1 tablet (1,000 mg total) by mouth daily. 30 tablet 0   No current facility-administered medications for this visit.      OBJECTIVE: Middle-aged white woman who appears stated age 15:   04/30/18 1221 04/30/18 1228  BP:  (!) 180/84  Pulse: 73   Resp: 18   Temp: 99.2 F (37.3 C)   SpO2: 100%      Body mass index is 41.49 kg/m.    ECOG FS:1 -  Symptomatic but completely ambulatory Filed Weights   04/30/18 1221  Weight: 241 lb 11.2 oz (109.6 kg)   Sclerae unicteric, pupils round and equal Oropharynx clear and moist No cervical or supraclavicular adenopathy Lungs no rales or rhonchi; dry cough occasionally during exam  Heart regular rate and rhythm Abd soft, obese, nontender,  positive bowel sounds MSK no focal spinal tenderness, no upper extremity lymphedema Neuro: nonfocal, well oriented, appropriate affect Breasts: The right breast is status post lumpectomy and radiation with no evidence of disease recurrence.  The left breast is benign.  Both axillae are benign. Skin: Yeast rash at the inferior mammary fold bilaterally    LAB RESULTS:  CMP     Component Value Date/Time   NA 139 04/02/2018 1116   NA 140 05/29/2017 1254   K 4.2 04/02/2018 1116   K 4.4 05/29/2017 1254   CL 108 04/02/2018 1116   CO2 23 04/02/2018 1116   CO2 25 05/29/2017 1254   GLUCOSE 219 (H) 04/02/2018 1116   GLUCOSE 154 (H) 05/29/2017 1254   BUN 12 04/02/2018 1116   BUN 11.5 05/29/2017 1254   CREATININE 0.80 04/02/2018 1116   CREATININE 0.8 05/29/2017 1254   CALCIUM 9.1 04/02/2018 1116   CALCIUM 9.3 05/29/2017 1254   PROT 6.5 04/02/2018 1116   PROT 7.0 05/29/2017 1254   ALBUMIN 3.6 04/02/2018 1116   ALBUMIN 4.1 05/29/2017 1254   AST 13 (L) 04/02/2018 1116   AST 13 05/29/2017 1254   ALT 15 04/02/2018 1116   ALT 14 05/29/2017 1254   ALKPHOS 67 04/02/2018 1116   ALKPHOS 66 05/29/2017 1254   BILITOT 0.4 04/02/2018 1116   BILITOT 0.50 05/29/2017 1254   GFRNONAA >60 04/02/2018 1116   GFRAA >60 04/02/2018 1116    INo results found for: SPEP, UPEP  Lab Results  Component Value Date   WBC 1.8 (L) 04/30/2018   NEUTROABS 1.1 (L) 04/30/2018   HGB 11.7 (L) 04/30/2018   HCT 35.0 (L) 04/30/2018   MCV 95.4 04/30/2018   PLT 126 (L) 04/30/2018      Chemistry      Component Value Date/Time   NA 139 04/02/2018 1116   NA 140 05/29/2017 1254    K 4.2 04/02/2018 1116   K 4.4 05/29/2017 1254   CL 108 04/02/2018 1116   CO2 23 04/02/2018 1116   CO2 25 05/29/2017 1254   BUN 12 04/02/2018 1116   BUN 11.5 05/29/2017 1254   CREATININE 0.80 04/02/2018 1116   CREATININE 0.8 05/29/2017 1254      Component Value Date/Time   CALCIUM 9.1 04/02/2018 1116   CALCIUM 9.3 05/29/2017 1254   ALKPHOS 67 04/02/2018 1116   ALKPHOS 66 05/29/2017 1254   AST 13 (L) 04/02/2018 1116   AST 13 05/29/2017 1254   ALT 15 04/02/2018 1116   ALT 14 05/29/2017 1254   BILITOT 0.4 04/02/2018 1116   BILITOT 0.50 05/29/2017 1254       No results found for: LABCA2  No components found for: QIHKV425  No results for input(s): INR in the last 168 hours.  Urinalysis    Component Value Date/Time   COLORURINE YELLOW 08/18/2017 1322   APPEARANCEUR HAZY (A) 08/18/2017 1322   LABSPEC 1.010 08/18/2017 1322   PHURINE 7.0 08/18/2017 1322   GLUCOSEU NEGATIVE 08/18/2017 1322   GLUCOSEU NEGATIVE 10/30/2014 1513   HGBUR LARGE (A) 08/18/2017 1322   BILIRUBINUR NEGATIVE 08/18/2017 1322   KETONESUR NEGATIVE 08/18/2017 1322   PROTEINUR 30 (A) 08/18/2017 1322   UROBILINOGEN 0.2 10/30/2014 1513   NITRITE NEGATIVE 08/18/2017 1322   LEUKOCYTESUR LARGE (A) 08/18/2017 1322    STUDIES: No results found.  ELIGIBLE FOR AVAILABLE RESEARCH PROTOCOL: no  ASSESSMENT: 72 y.o. [place] woman with a remote history of early stage endometrial cancer, now status post right breast upper  outer quadrant biopsy 01/22/2016 for a clinically multifocal T2 N0, stage 2A invasive ductal carcinoma, grade 1, estrogen and progesterone receptor positive, HER-2 negative, with an MIB-1 between 10 and 15%.  (1) right axillary lymph node biopsy 03/02/2016 positive  (2) genetics testing 01/13/2016 through the Custom gene panel offered by [facility] found no deleterious mutations in  ATM, BARD1, BRCA1, BRCA2, BRIP1, CDH1, CHEK2, EPCAM, FANCC, MLH1, MSH2, MSH6, MUTYH, NBN, PALB2, PMS2, POLD1,  PTEN, RAD51C, RAD51D, TP53, and XRCC2  METASTATIC DISEASE: OCT 2017 (3) CT scans of the chest abdomen and pelvis obtained 03/10/2016 are consistent with bilateral lung metastases and mediastinal and hilar nodal involvement, but no liver or bone spread  (a) bronchoscopic lymph node biopsy 2 (station 7, 13R) 03/28/2016 confirms metastatic adenocarcinoma, estrogen receptor positive, HER-2 not amplified  (b) baseline CA-27-29 on 04/18/2016 was 137.5.  (4) letrozole started 03/15/2016, palbociclib added 03/29/2016 at 125 mg/day, 21/7  (a) dose decreased to 100 mg per day, 21/7, beginning with February cycle  (b) palbociclib held 01/31/2017, with increasing symptoms  (c) palbociclib resumed October 2018 at 75 mg daily  (d) palbociclib dose reduced to 75 mg every other day February through April 2019  (e) palbociclib dose resumed at 75 mg daily as of 10/17/2017  (5) status post double right lumpectomies and right axillary lymph node sampling 01/13/2017 for 2 separate invasive ductal carcinoma lesions, pT1a and pT1b, N1a, with negative margins, both lesions being estrogen and progesterone receptor positive and HER-2 negative  (6) adjuvant radiation completed 07/05/2017 1. 50.4 Gy in 28 fractions to the right breast and supraclavicular region using whole-breast tangent fields. 2. Boost to the seroma delivered an additional 10 Gy in 5 fractions. The total dose was 60.4 Gy.  (7) restaging studies:  (a) CT scan of the chest and bone scan 06/23/2017 showed stable scattered very small lung nodules, no bone lesions  (b) CT of the chest 10/17/2017 showed no new or progressive metastatic disease in the chest. The small left lower lobe pulmonary nodule is stable  (c) PET scan on 03/02/2018: shows no findings for residual or recurrent right breast cancer   (8) right upper pole renal lesion noted to be enlarging on CT scan 06/22/2017  (a) no uptake on PET scan obtained 03/02/2018   PLAN: [name] is now a  little over 2 years out from definitive diagnosis of metastatic breast cancer, with no evidence of disease activity.  This is very favorable.  She tolerates the letrozole well and she is doing well on palbociclib at the current dose.  Her counts are borderline and we will need to continue to follow them monthly.  I think she may have asthma as a reason for her cough and I prescribed an albuterol inhaler for her.  I discussed how to use it and I will try to get her a spacer to make it more easy.  We also discussed a Medrol Dosepak which I think would take care of the problem but it is likely to make her diabetes worse so we are holding off on that.  She has a ketoconazole cream to use for the yeast rash noted on the physical exam  At this point I will start seeing her on an every two-month basis.  Her next visit with me will be in January  She knows to call for any other issues that may develop before then.    Creta Dorame, Virgie Dad, MD  04/30/18 12:50 PM Medical Oncology and Hematology [facility]  [place], [place] [address] Tel. [phone]    Fax. [phone]    Elie Goody, am acting as scribe for Dr. Virgie Dad. Kasey Hansell.  I, Lurline Del MD, have reviewed the above documentation for accuracy and completeness, and I agree with the above.

## 2018-04-30 ENCOUNTER — Telehealth: Payer: Self-pay | Admitting: Oncology

## 2018-04-30 ENCOUNTER — Inpatient Hospital Stay (HOSPITAL_BASED_OUTPATIENT_CLINIC_OR_DEPARTMENT_OTHER): Payer: Medicare Other | Admitting: Oncology

## 2018-04-30 ENCOUNTER — Inpatient Hospital Stay: Payer: Medicare Other | Attending: Oncology

## 2018-04-30 VITALS — BP 180/84 | HR 73 | Temp 99.2°F | Resp 18 | Ht 64.0 in | Wt 241.7 lb

## 2018-04-30 DIAGNOSIS — Z801 Family history of malignant neoplasm of trachea, bronchus and lung: Secondary | ICD-10-CM

## 2018-04-30 DIAGNOSIS — Z79899 Other long term (current) drug therapy: Secondary | ICD-10-CM | POA: Diagnosis not present

## 2018-04-30 DIAGNOSIS — Z7984 Long term (current) use of oral hypoglycemic drugs: Secondary | ICD-10-CM

## 2018-04-30 DIAGNOSIS — I10 Essential (primary) hypertension: Secondary | ICD-10-CM

## 2018-04-30 DIAGNOSIS — K219 Gastro-esophageal reflux disease without esophagitis: Secondary | ICD-10-CM

## 2018-04-30 DIAGNOSIS — Z86718 Personal history of other venous thrombosis and embolism: Secondary | ICD-10-CM | POA: Insufficient documentation

## 2018-04-30 DIAGNOSIS — Z8673 Personal history of transient ischemic attack (TIA), and cerebral infarction without residual deficits: Secondary | ICD-10-CM

## 2018-04-30 DIAGNOSIS — Z90710 Acquired absence of both cervix and uterus: Secondary | ICD-10-CM

## 2018-04-30 DIAGNOSIS — Z8542 Personal history of malignant neoplasm of other parts of uterus: Secondary | ICD-10-CM | POA: Insufficient documentation

## 2018-04-30 DIAGNOSIS — Z170 Estrogen receptor positive status [ER+]: Secondary | ICD-10-CM

## 2018-04-30 DIAGNOSIS — Z79811 Long term (current) use of aromatase inhibitors: Secondary | ICD-10-CM

## 2018-04-30 DIAGNOSIS — R509 Fever, unspecified: Secondary | ICD-10-CM

## 2018-04-30 DIAGNOSIS — C7801 Secondary malignant neoplasm of right lung: Secondary | ICD-10-CM

## 2018-04-30 DIAGNOSIS — E119 Type 2 diabetes mellitus without complications: Secondary | ICD-10-CM | POA: Insufficient documentation

## 2018-04-30 DIAGNOSIS — Z803 Family history of malignant neoplasm of breast: Secondary | ICD-10-CM | POA: Insufficient documentation

## 2018-04-30 DIAGNOSIS — C50411 Malignant neoplasm of upper-outer quadrant of right female breast: Secondary | ICD-10-CM

## 2018-04-30 DIAGNOSIS — E785 Hyperlipidemia, unspecified: Secondary | ICD-10-CM | POA: Insufficient documentation

## 2018-04-30 DIAGNOSIS — R05 Cough: Secondary | ICD-10-CM

## 2018-04-30 DIAGNOSIS — C7800 Secondary malignant neoplasm of unspecified lung: Secondary | ICD-10-CM

## 2018-04-30 DIAGNOSIS — R5383 Other fatigue: Secondary | ICD-10-CM | POA: Diagnosis not present

## 2018-04-30 DIAGNOSIS — Z7982 Long term (current) use of aspirin: Secondary | ICD-10-CM

## 2018-04-30 DIAGNOSIS — K760 Fatty (change of) liver, not elsewhere classified: Secondary | ICD-10-CM

## 2018-04-30 DIAGNOSIS — B3789 Other sites of candidiasis: Secondary | ICD-10-CM | POA: Diagnosis not present

## 2018-04-30 DIAGNOSIS — Z923 Personal history of irradiation: Secondary | ICD-10-CM | POA: Diagnosis not present

## 2018-04-30 LAB — COMPREHENSIVE METABOLIC PANEL
ALT: 12 U/L (ref 0–44)
AST: 11 U/L — ABNORMAL LOW (ref 15–41)
Albumin: 3.8 g/dL (ref 3.5–5.0)
Alkaline Phosphatase: 72 U/L (ref 38–126)
Anion gap: 8 (ref 5–15)
BUN: 11 mg/dL (ref 8–23)
CO2: 26 mmol/L (ref 22–32)
Calcium: 9.2 mg/dL (ref 8.9–10.3)
Chloride: 108 mmol/L (ref 98–111)
Creatinine, Ser: 0.92 mg/dL (ref 0.44–1.00)
GFR calc Af Amer: >60 mL/min (ref >60)
GFR calc non Af Amer: >60 mL/min (ref >60)
Glucose, Bld: 217 mg/dL — ABNORMAL HIGH (ref 70–99)
Potassium: 4.2 mmol/L (ref 3.5–5.1)
Sodium: 142 mmol/L (ref 135–145)
Total Bilirubin: 0.5 mg/dL (ref 0.3–1.2)
Total Protein: 6.9 g/dL (ref 6.5–8.1)

## 2018-04-30 LAB — CBC WITH DIFFERENTIAL/PLATELET
Abs Immature Granulocytes: 0.01 10*3/uL (ref 0.00–0.07)
Basophils Absolute: 0 10*3/uL (ref 0.0–0.1)
Basophils Relative: 1 %
Eosinophils Absolute: 0 10*3/uL (ref 0.0–0.5)
Eosinophils Relative: 2 %
HCT: 35 % — ABNORMAL LOW (ref 36.0–46.0)
Hemoglobin: 11.7 g/dL — ABNORMAL LOW (ref 12.0–15.0)
Immature Granulocytes: 1 %
Lymphocytes Relative: 27 %
Lymphs Abs: 0.5 10*3/uL — ABNORMAL LOW (ref 0.7–4.0)
MCH: 31.9 pg (ref 26.0–34.0)
MCHC: 33.4 g/dL (ref 30.0–36.0)
MCV: 95.4 fL (ref 80.0–100.0)
Monocytes Absolute: 0.1 10*3/uL (ref 0.1–1.0)
Monocytes Relative: 7 %
Neutro Abs: 1.1 10*3/uL — ABNORMAL LOW (ref 1.7–7.7)
Neutrophils Relative %: 62 %
Platelets: 126 10*3/uL — ABNORMAL LOW (ref 150–400)
RBC: 3.67 MIL/uL — ABNORMAL LOW (ref 3.87–5.11)
RDW: 15 % (ref 11.5–15.5)
WBC: 1.8 10*3/uL — ABNORMAL LOW (ref 4.0–10.5)
nRBC: 0 % (ref 0.0–0.2)

## 2018-04-30 MED ORDER — ALBUTEROL SULFATE HFA 108 (90 BASE) MCG/ACT IN AERS: 2.0000 | INHALATION_SPRAY | Freq: Four times a day (QID) | RESPIRATORY_TRACT | 2 refills | Status: DC | PRN

## 2018-04-30 MED FILL — IBRANCE 75 MG CAPSULE: 75 | 28 days supply | Qty: 21 | Fill #1

## 2018-04-30 NOTE — Telephone Encounter (Signed)
Gave patient avs and calendar.   °

## 2018-05-01 ENCOUNTER — Other Ambulatory Visit: Payer: Medicare Other

## 2018-05-01 LAB — CANCER ANTIGEN 27.29: CA 27.29: 30.7 U/mL (ref 0.0–38.6)

## 2018-05-17 ENCOUNTER — Telehealth: Payer: Self-pay

## 2018-05-17 NOTE — Telephone Encounter (Signed)
Oral Oncology Patient Advocate Encounter  [name] came into the office and had me make a copy of her new insurance card that will go into effect 06/13/18. The [employer] where she has her grant to cover her copays at the pharmacy for her [place] sent her a re-enrollment text. The text stated that she could re-enroll on 05-14-18. I went on the website and it requested a PIN number, [name] did not receive a PIN number and The [place] [employer] representative would not give me that information when I called them. [name] called them and got her PIN number which is [id number], and they submitted her re-enrollment. With her PIN number I was able to check the website and they did submit her re-enrollment and the decision should be made by 06/27/18 as to whether she will get the grant for next year or not. I wrote her information down to apply for another grant if one comes available.   [name] verbalized understanding and great appreciation of this.  [place] Patient [place] Phone [phone] Fax [phone]

## 2018-05-23 DIAGNOSIS — H401134 Primary open-angle glaucoma, bilateral, indeterminate stage: Secondary | ICD-10-CM | POA: Diagnosis not present

## 2018-05-23 DIAGNOSIS — Z961 Presence of intraocular lens: Secondary | ICD-10-CM | POA: Diagnosis not present

## 2018-05-28 ENCOUNTER — Inpatient Hospital Stay: Payer: Medicare Other | Attending: Oncology

## 2018-05-28 DIAGNOSIS — C7800 Secondary malignant neoplasm of unspecified lung: Secondary | ICD-10-CM | POA: Diagnosis not present

## 2018-05-28 DIAGNOSIS — K760 Fatty (change of) liver, not elsewhere classified: Secondary | ICD-10-CM

## 2018-05-28 DIAGNOSIS — C170 Malignant neoplasm of duodenum: Secondary | ICD-10-CM | POA: Insufficient documentation

## 2018-05-28 DIAGNOSIS — Z923 Personal history of irradiation: Secondary | ICD-10-CM | POA: Diagnosis not present

## 2018-05-28 DIAGNOSIS — Z79811 Long term (current) use of aromatase inhibitors: Secondary | ICD-10-CM | POA: Insufficient documentation

## 2018-05-28 DIAGNOSIS — Z8542 Personal history of malignant neoplasm of other parts of uterus: Secondary | ICD-10-CM

## 2018-05-28 DIAGNOSIS — C7801 Secondary malignant neoplasm of right lung: Secondary | ICD-10-CM

## 2018-05-28 DIAGNOSIS — C50411 Malignant neoplasm of upper-outer quadrant of right female breast: Secondary | ICD-10-CM | POA: Diagnosis not present

## 2018-05-28 DIAGNOSIS — Z170 Estrogen receptor positive status [ER+]: Secondary | ICD-10-CM

## 2018-05-28 LAB — CBC WITH DIFFERENTIAL/PLATELET
Abs Immature Granulocytes: 0.01 10*3/uL (ref 0.00–0.07)
Basophils Absolute: 0 10*3/uL (ref 0.0–0.1)
Basophils Relative: 1 %
Eosinophils Absolute: 0 10*3/uL (ref 0.0–0.5)
Eosinophils Relative: 1 %
HCT: 35.1 % — ABNORMAL LOW (ref 36.0–46.0)
Hemoglobin: 11.9 g/dL — ABNORMAL LOW (ref 12.0–15.0)
Immature Granulocytes: 1 %
Lymphocytes Relative: 35 %
Lymphs Abs: 0.7 10*3/uL (ref 0.7–4.0)
MCH: 32 pg (ref 26.0–34.0)
MCHC: 33.9 g/dL (ref 30.0–36.0)
MCV: 94.4 fL (ref 80.0–100.0)
Monocytes Absolute: 0.2 10*3/uL (ref 0.1–1.0)
Monocytes Relative: 11 %
Neutro Abs: 1 10*3/uL — ABNORMAL LOW (ref 1.7–7.7)
Neutrophils Relative %: 51 %
Platelets: 147 10*3/uL — ABNORMAL LOW (ref 150–400)
RBC: 3.72 MIL/uL — ABNORMAL LOW (ref 3.87–5.11)
RDW: 15.1 % (ref 11.5–15.5)
WBC: 2 10*3/uL — ABNORMAL LOW (ref 4.0–10.5)
nRBC: 0 % (ref 0.0–0.2)

## 2018-05-28 LAB — COMPREHENSIVE METABOLIC PANEL
ALT: 16 U/L (ref 0–44)
AST: 14 U/L — ABNORMAL LOW (ref 15–41)
Albumin: 4.1 g/dL (ref 3.5–5.0)
Alkaline Phosphatase: 72 U/L (ref 38–126)
Anion gap: 11 (ref 5–15)
BUN: 13 mg/dL (ref 8–23)
CO2: 25 mmol/L (ref 22–32)
Calcium: 9.3 mg/dL (ref 8.9–10.3)
Chloride: 106 mmol/L (ref 98–111)
Creatinine, Ser: 0.81 mg/dL (ref 0.44–1.00)
GFR calc Af Amer: >60 mL/min (ref >60)
GFR calc non Af Amer: >60 mL/min (ref >60)
Glucose, Bld: 128 mg/dL — ABNORMAL HIGH (ref 70–99)
Potassium: 4.3 mmol/L (ref 3.5–5.1)
Sodium: 142 mmol/L (ref 135–145)
Total Bilirubin: 0.6 mg/dL (ref 0.3–1.2)
Total Protein: 7.2 g/dL (ref 6.5–8.1)

## 2018-05-28 MED FILL — IBRANCE 75 MG CAPSULE: 75 | 28 days supply | Qty: 21 | Fill #2

## 2018-05-29 DIAGNOSIS — E119 Type 2 diabetes mellitus without complications: Secondary | ICD-10-CM | POA: Diagnosis not present

## 2018-05-29 DIAGNOSIS — Z8673 Personal history of transient ischemic attack (TIA), and cerebral infarction without residual deficits: Secondary | ICD-10-CM | POA: Diagnosis not present

## 2018-05-29 DIAGNOSIS — Z7984 Long term (current) use of oral hypoglycemic drugs: Secondary | ICD-10-CM | POA: Diagnosis not present

## 2018-05-29 DIAGNOSIS — I10 Essential (primary) hypertension: Secondary | ICD-10-CM | POA: Diagnosis not present

## 2018-05-29 DIAGNOSIS — Z882 Allergy status to sulfonamides status: Secondary | ICD-10-CM | POA: Diagnosis not present

## 2018-05-29 DIAGNOSIS — T85398A Other mechanical complication of other ocular prosthetic devices, implants and grafts, initial encounter: Secondary | ICD-10-CM | POA: Diagnosis not present

## 2018-05-29 DIAGNOSIS — Z7982 Long term (current) use of aspirin: Secondary | ICD-10-CM | POA: Diagnosis not present

## 2018-05-29 DIAGNOSIS — Z885 Allergy status to narcotic agent status: Secondary | ICD-10-CM | POA: Diagnosis not present

## 2018-05-29 DIAGNOSIS — Z79899 Other long term (current) drug therapy: Secondary | ICD-10-CM | POA: Diagnosis not present

## 2018-05-29 DIAGNOSIS — G4730 Sleep apnea, unspecified: Secondary | ICD-10-CM | POA: Diagnosis not present

## 2018-05-29 DIAGNOSIS — H401124 Primary open-angle glaucoma, left eye, indeterminate stage: Secondary | ICD-10-CM | POA: Diagnosis not present

## 2018-05-29 DIAGNOSIS — E785 Hyperlipidemia, unspecified: Secondary | ICD-10-CM | POA: Diagnosis not present

## 2018-05-29 DIAGNOSIS — H209 Unspecified iridocyclitis: Secondary | ICD-10-CM | POA: Diagnosis not present

## 2018-05-29 DIAGNOSIS — Z888 Allergy status to other drugs, medicaments and biological substances status: Secondary | ICD-10-CM | POA: Diagnosis not present

## 2018-05-29 DIAGNOSIS — Z961 Presence of intraocular lens: Secondary | ICD-10-CM | POA: Diagnosis not present

## 2018-05-29 LAB — CANCER ANTIGEN 27.29: CA 27.29: 26.3 U/mL (ref 0.0–38.6)

## 2018-06-07 ENCOUNTER — Other Ambulatory Visit: Payer: Self-pay | Admitting: Adult Health

## 2018-06-07 DIAGNOSIS — C50411 Malignant neoplasm of upper-outer quadrant of right female breast: Secondary | ICD-10-CM

## 2018-06-07 DIAGNOSIS — Z170 Estrogen receptor positive status [ER+]: Secondary | ICD-10-CM

## 2018-06-07 NOTE — Telephone Encounter (Signed)
Oral Oncology Patient Advocate Encounter  I followed up with the [employer] today and [name] has been re-enrolled, she has been approved through 06/13/19.   I called her and gave her the good news, she verbalized understanding and great appreciation.  [place] Patient [place] Phone [phone] Fax [phone]

## 2018-06-11 ENCOUNTER — Other Ambulatory Visit: Payer: Self-pay

## 2018-06-11 DIAGNOSIS — Z170 Estrogen receptor positive status [ER+]: Secondary | ICD-10-CM

## 2018-06-11 DIAGNOSIS — C50411 Malignant neoplasm of upper-outer quadrant of right female breast: Secondary | ICD-10-CM

## 2018-06-11 MED ORDER — LETROZOLE 2.5 MG PO TABS: 2.5000 mg | ORAL_TABLET | Freq: Every day | ORAL | 3 refills | Status: DC

## 2018-06-14 ENCOUNTER — Telehealth: Payer: Self-pay

## 2018-06-14 NOTE — Telephone Encounter (Signed)
Pt called stating that medicare will not pay for a certain manufacturer of her letrozole. She can only get letrozole made by [employer], which currently on national back order at this time. Luckily, her [facility] pharmacy was able to provide her with a 30 day supply of this letrozole. Pt is coming in on 1/13 and will discuss with Dr.Magrinat about an alternative after she is done with her 30 day supply of letrozole.

## 2018-06-24 NOTE — Progress Notes (Signed)
[place]  [phone] [phone]     ID: [name] DOB: 03-31-1946  MR#: [id number]  YOM#:[id number]  Patient Care Team: Biagio Borg, MD as PCP - Stevan Born, MD as Consulting Physician (General Surgery) Magrinat, Virgie Dad, MD as Consulting Physician (Oncology) Kyung Rudd, MD as Consulting Physician (Radiation Oncology) Bobbye Charleston, MD as Consulting Physician (Obstetrics and Gynecology) Nada Libman, MD as Referring Physician (Specialist) Vevelyn Royals, MD as Consulting Physician (Ophthalmology) Lamonte Sakai Rose Fillers, MD as Consulting Physician (Pulmonary Disease) Alexis Frock, MD as Consulting Physician (Urology) OTHER MD:  CHIEF COMPLAINT: Estrogen receptor positive breast cancer  CURRENT TREATMENT: Letrozole, palbociclib   INTERVAL HISTORY: [name] returns today for follow-up and treatment of her estrogen receptor positive stage IV breast cancer.  The patient continues on letrozole. She tolerates this well and without any noticeable side effects.    The patient also continues on palbociclib. She tolerates this well and without any noticeable side effects.     Since her last visit here, she has not undergone any additional studies.  She has bilateral mammography in May.  Her most recent scan was PET scan in September 2019 which showed no active disease.  Results for [name], [name] (MRN [id number]) as of 06/25/2018 11:20  Ref. Range 02/06/2018 10:50 03/06/2018 09:55 04/02/2018 11:16 04/30/2018 12:01 05/28/2018 11:47  CA 27.29 Latest Ref Range: 0.0 - 38.6 U/mL 24.1 26.0 27.0 30.7 26.3    REVIEW OF SYSTEMS: [name] has noticed that her hair has become more thin. She has fatigue; she does the cooking at home, but she does housework sparingly. [name] states that her right breast hurt for about three days before Christmas, then, after Christmas, she thought she felt a lump, without redness on her left breast, but it went away  after about three days. The patient denies unusual headaches, visual changes, nausea, vomiting, or dizziness. There has been no unusual cough, phlegm production, or pleurisy. This been no change in bowel or bladder habits. The patient denies unexplained weight loss, bleeding, rash, or fever. A detailed review of systems was otherwise noncontributory.    BREAST CANCER HISTORY: From the original intake note:  [name] had screening mammography showing some suspicious calcifications in the right breast leading to right diagnostic mammography with ultrasonography 01/22/2016 at [facility]. The breast density was category C. In the upper right breast there was a 2.3 cm mass with additional masses measuring 0.9 and 0.7 cm. There was also a possible additional 0.8 mass in the lower inner quadrant. Ultrasound confirmed an irregular hypoechoic mass in the right breast upper outer quadrant measuring 2.0 cm. There were other masses measuring 0.7 and 0.8 cm by ultrasonography. The right axilla was sonographically benign.  Biopsy of a 12:00 and 4:00 mass in the right breast 01/22/2016 showed ([id number]) both specimens showing invasive ductal carcinoma, grade 1 or 2, both 95% estrogen receptor positive, both 95% progesterone receptor positive, both with strong staining intensity, with MIB-1 ranging from 10-15%, and both HER-2 negative, the signals ratio being 1.23-1.42, and the number per cell 1.85-2.59.  Her subsequent history is as detailed below    PAST MEDICAL HISTORY: Past Medical History:  Diagnosis Date  . Breast cancer ([place])   . Cancer ([place]) 02/2016   right breast  . DIABETES MELLITUS, TYPE II 01/04/2007   only takes actoplus daily  . Dizziness and giddiness 02/29/2008  . DVT, HX OF    at age 62 in right buttocks  .  Dyspnea    due to lung cancer  . Family history of breast cancer   . GERD 01/04/2007   pt reports resolved   . GLAUCOMA 07/30/2008   both eyes  . History of blood transfusion    no  abnormal  reaction  . History of uterine cancer 2000   hysterectomy done  . HYPERLIPIDEMIA 01/04/2007   taking Pravastatin daily  . HYPERTENSION 01/04/2007   takes Lisinopril daily  . Joint pain   . Joint swelling   . Leg cramps   . LEG PAIN, LEFT 07/06/2007  . NUMBNESS 07/30/2008   in fingers;pt states from Diamox  . OSTEOARTHRITIS, HIP 09/25/2009  . OTITIS MEDIA, ACUTE, BILATERAL 02/29/2008  . Overweight(278.02) 01/04/2007  . Peripheral vascular disease ([place])   . Personal history of radiation therapy 2018  . Pneumonia   . PONV (postoperative nausea and vomiting)   . SLEEP APNEA, OBSTRUCTIVE    doesn't use a cpap;study done about 63yr ago  . TRANSIENT ISCHEMIC ATTACK, HX OF 01/04/2007  . Vision loss    left eye    PAST SURGICAL HISTORY: Past Surgical History:  Procedure Laterality Date  . ABDOMINAL HYSTERECTOMY  2000  . BREAST LUMPECTOMY Right 01/13/2017   x2  . BREAST LUMPECTOMY WITH RADIOACTIVE SEED AND SENTINEL LYMPH NODE BIOPSY Right 01/13/2017   Procedure: RIGHT BREAST RADIOACTIVE SEED X'S 2 GUIDED LUMPECTOMY WITH RADIOACTIVE SEED TARGETED AXILLARYLYMPH NODE EXCISION AND RIGHT AXILLARY SENTINEL LYMPH NODE BIOPSY;  Surgeon: NAlphonsa Overall MD;  [facility]  Service: General;  Laterality: Right;  2 SEEDS IN RIGHT BREAST 1 SEED IN RIGHT AXILLARY NODE  . CHOLECYSTECTOMY    . ENDOBRONCHIAL ULTRASOUND Bilateral 03/28/2016   Procedure: ENDOBRONCHIAL ULTRASOUND;  Surgeon: RCollene Gobble MD;  Location: [facility];  Service: Cardiopulmonary;  Laterality: Bilateral;  . EYE SURGERY  13   shunt left and lazer eye surgery on right cataract and retenia tear with repair  . growth removal  2004   from thumb  . KNEE ARTHROSCOPY Right   . mulitple eye surgeries     both eyes, cataracts with ioc done both eyes  . OOPHORECTOMY    . right lumpectomy with axillary node dissection Right 01/2017  . TOTAL HIP ARTHROPLASTY  06/24/2011   Procedure: TOTAL HIP ARTHROPLASTY;  Surgeon: FKerin Salen  [place] [facility]  Service: Orthopedics;  Laterality: Right;  . TOTAL HIP ARTHROPLASTY Left 11/12/2012   Dr RMayer Camel . TOTAL HIP ARTHROPLASTY Left 11/12/2012   Procedure: TOTAL HIP ARTHROPLASTY;  Surgeon: FKerin Salen MD;  Location[facility]  Service: Orthopedics;  Laterality: Left;  DEPUY PINNACLE    FAMILY HISTORY Family History  Problem Relation Age of Onset  . Dementia Mother   . Cancer Mother        Breast and lung cancer  . Stroke Sister   . Breast cancer Sister 557 . Heart attack Maternal Aunt   . Lung cancer Maternal Grandmother        non smoker  . Glaucoma Maternal Grandfather   . Anesthesia problems Neg Hx   The patient's father died at age 8971 the patient's mother died at age 53926 She had breast and lung cancers diagnosed shortly before her death. The patient had no brothers, 2 sisters. One sister was diagnosed with breast cancer at the age of 550   GYNECOLOGIC HISTORY:  No LMP recorded. Patient has had a hysterectomy. Menarche age 73 first live birth age 73 the patient  is GX P1. She had a hysterectomy for endometrial cancer in the year 2000. She did not take hormone replacement. She did use oral contraceptives for more than 20 years remotely, with no complications.   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 She is home with [name]. He is a retired Dealer.Their son [name] also lives in [place].    ADVANCED DIRECTIVES: In place  HEALTH MAINTENANCE: Social History   Tobacco Use  . Smoking status: Never Smoker  . Smokeless tobacco: Never Used  Substance Use Topics  . Alcohol use: No  . Drug use: No     Colonoscopy: Never  PAP: Status post hysterectomy  Bone density: Remote   Allergies  Allergen Reactions  . Codeine Hives    Hycodan syrup  . Fluorescein Nausea And Vomiting    ? IV dye for retina specialist  . Lipitor [Atorvastatin Calcium]     Leg  cramp  . Oxycodone Nausea And Vomiting    Patient vomited for 3 days after taking  . Sitagliptin Phosphate Nausea And Vomiting  . Sulfa Drugs Cross Reactors Nausea And Vomiting    Current Outpatient Medications  Medication Sig Dispense Refill  . acetaminophen (TYLENOL) 500 MG tablet Take 1,000 mg by mouth every 4 (four) hours as needed for moderate pain or fever.    Marland Kitchen albuterol (PROVENTIL HFA;VENTOLIN HFA) 108 (90 Base) MCG/ACT inhaler Inhale 2 puffs into the lungs every 6 (six) hours as needed for wheezing or shortness of breath. Please provide spacer to pt 1 Inhaler 2  . aspirin EC 81 MG tablet Take 81 mg by mouth daily at 6 PM. 1700    . bimatoprost (LUMIGAN) 0.01 % SOLN Place 1 drop into both eyes at bedtime.    . brimonidine-timolol (COMBIGAN) 0.2-0.5 % ophthalmic solution Place 1 drop into both eyes three times daily    . cholecalciferol (VITAMIN D) 1000 units tablet Take 1,000 Units by mouth daily.    . dorzolamide-timolol (COSOPT) 22.3-6.8 MG/ML ophthalmic solution Place 1 drop into both eyes 2 (two) times daily.    [name] 75 MG capsule TAKE 1 CAPSULE (75 MG TOTAL) BY MOUTH DAILY WITH BREAKFAST. TAKE WHOLE WITH FOOD. 21 capsule 6  . letrozole (FEMARA) 2.5 MG tablet Take 1 tablet (2.5 mg total) by mouth at bedtime. 90 tablet 3  . lisinopril (PRINIVIL,ZESTRIL) 20 MG tablet Take 1 tablet (20 mg total) by mouth daily. 90 tablet 3  . lovastatin (MEVACOR) 20 MG tablet Take 1 tablet (20 mg total) by mouth every evening. 90 tablet 3  . palbociclib (IBRANCE) 75 MG capsule Take 1 capsule (75 mg total) by mouth daily with breakfast. Take whole with food. 21 capsule 6  . pioglitazone-metformin (ACTOPLUS MET) 15-500 MG tablet Take 1 tablet by mouth daily. 90 tablet 3  . valACYclovir (VALTREX) 1000 MG tablet Take 1 tablet (1,000 mg total) by mouth daily. 30 tablet 0   No current facility-administered medications for this visit.      OBJECTIVE: Middle-aged white woman in no acute  distress Vitals:   06/25/18 1110  BP: (!) 165/89  Pulse: 63  Resp: 18  Temp: 98.4 F (36.9 C)  SpO2: 99%     Body mass index is 41.13 kg/m.    ECOG FS:1 - Symptomatic but completely ambulatory Filed Weights   06/25/18 1110  Weight: 239 lb 9.6 oz (108.7 kg)   Sclerae  unicteric, EOMs intact Oropharynx clear and moist No cervical or supraclavicular adenopathy Lungs no rales or rhonchi Heart regular rate and rhythm Abd soft, nontender, positive bowel sounds MSK no focal spinal tenderness, no upper extremity lymphedema Neuro: nonfocal, well oriented, appropriate affect Breasts: The right breast is status post lumpectomy and radiation.  There is minimal distortion of the breast contour.  There is some skin coarsening as expected.  There is no evidence of disease recurrence.  The left breast is benign.  Both axillae are benign.    LAB RESULTS:  CMP     Component Value Date/Time   NA 142 05/28/2018 1147   NA 140 05/29/2017 1254   K 4.3 05/28/2018 1147   K 4.4 05/29/2017 1254   CL 106 05/28/2018 1147   CO2 25 05/28/2018 1147   CO2 25 05/29/2017 1254   GLUCOSE 128 (H) 05/28/2018 1147   GLUCOSE 154 (H) 05/29/2017 1254   BUN 13 05/28/2018 1147   BUN 11.5 05/29/2017 1254   CREATININE 0.81 05/28/2018 1147   CREATININE 0.80 04/02/2018 1116   CREATININE 0.8 05/29/2017 1254   CALCIUM 9.3 05/28/2018 1147   CALCIUM 9.3 05/29/2017 1254   PROT 7.2 05/28/2018 1147   PROT 7.0 05/29/2017 1254   ALBUMIN 4.1 05/28/2018 1147   ALBUMIN 4.1 05/29/2017 1254   AST 14 (L) 05/28/2018 1147   AST 13 (L) 04/02/2018 1116   AST 13 05/29/2017 1254   ALT 16 05/28/2018 1147   ALT 15 04/02/2018 1116   ALT 14 05/29/2017 1254   ALKPHOS 72 05/28/2018 1147   ALKPHOS 66 05/29/2017 1254   BILITOT 0.6 05/28/2018 1147   BILITOT 0.4 04/02/2018 1116   BILITOT 0.50 05/29/2017 1254   GFRNONAA >60 05/28/2018 1147   GFRNONAA >60 04/02/2018 1116   GFRAA >60 05/28/2018 1147   GFRAA >60 04/02/2018 1116     INo results found for: SPEP, UPEP  Lab Results  Component Value Date   WBC 1.9 (L) 06/25/2018   NEUTROABS 1.1 (L) 06/25/2018   HGB 11.9 (L) 06/25/2018   HCT 34.8 (L) 06/25/2018   MCV 95.3 06/25/2018   PLT 138 (L) 06/25/2018      Chemistry      Component Value Date/Time   NA 142 05/28/2018 1147   NA 140 05/29/2017 1254   K 4.3 05/28/2018 1147   K 4.4 05/29/2017 1254   CL 106 05/28/2018 1147   CO2 25 05/28/2018 1147   CO2 25 05/29/2017 1254   BUN 13 05/28/2018 1147   BUN 11.5 05/29/2017 1254   CREATININE 0.81 05/28/2018 1147   CREATININE 0.80 04/02/2018 1116   CREATININE 0.8 05/29/2017 1254      Component Value Date/Time   CALCIUM 9.3 05/28/2018 1147   CALCIUM 9.3 05/29/2017 1254   ALKPHOS 72 05/28/2018 1147   ALKPHOS 66 05/29/2017 1254   AST 14 (L) 05/28/2018 1147   AST 13 (L) 04/02/2018 1116   AST 13 05/29/2017 1254   ALT 16 05/28/2018 1147   ALT 15 04/02/2018 1116   ALT 14 05/29/2017 1254   BILITOT 0.6 05/28/2018 1147   BILITOT 0.4 04/02/2018 1116   BILITOT 0.50 05/29/2017 1254       No results found for: LABCA2  No components found for: LABCA125  No results for input(s): INR in the last 168 hours.  Urinalysis    Component Value Date/Time   COLORURINE YELLOW 08/18/2017 1322   APPEARANCEUR HAZY (A) 08/18/2017 1322   LABSPEC 1.010 08/18/2017 1322  PHURINE 7.0 08/18/2017 1322   GLUCOSEU NEGATIVE 08/18/2017 1322   GLUCOSEU NEGATIVE 10/30/2014 1513   HGBUR LARGE (A) 08/18/2017 1322   BILIRUBINUR NEGATIVE 08/18/2017 1322   KETONESUR NEGATIVE 08/18/2017 1322   PROTEINUR 30 (A) 08/18/2017 1322   UROBILINOGEN 0.2 10/30/2014 1513   NITRITE NEGATIVE 08/18/2017 1322   LEUKOCYTESUR LARGE (A) 08/18/2017 1322    STUDIES: No results found.  ELIGIBLE FOR AVAILABLE RESEARCH PROTOCOL: no  ASSESSMENT: 73 y.o. [place] woman with a remote history of early stage endometrial cancer, now status post right breast upper outer quadrant biopsy 01/22/2016  for a clinically multifocal T2 N0, stage 2A invasive ductal carcinoma, grade 1, estrogen and progesterone receptor positive, HER-2 negative, with an MIB-1 between 10 and 15%.  (1) right axillary lymph node biopsy 03/02/2016 positive  (2) genetics testing 01/13/2016 through the Custom gene panel offered by [facility] found no deleterious mutations in  ATM, BARD1, BRCA1, BRCA2, BRIP1, CDH1, CHEK2, EPCAM, FANCC, MLH1, MSH2, MSH6, MUTYH, NBN, PALB2, PMS2, POLD1, PTEN, RAD51C, RAD51D, TP53, and XRCC2  METASTATIC DISEASE: OCT 2017 (3) CT scans of the chest abdomen and pelvis obtained 03/10/2016 are consistent with bilateral lung metastases and mediastinal and hilar nodal involvement, but no liver or bone spread  (a) bronchoscopic lymph node biopsy 2 (station 7, 13R) 03/28/2016 confirms metastatic adenocarcinoma, estrogen receptor positive, HER-2 not amplified  (b) baseline CA-27-29 on 04/18/2016 was 137.5.  (4) letrozole started 03/15/2016, palbociclib added 03/29/2016 at 125 mg/day, 21/7  (a) dose decreased to 100 mg per day, 21/7, beginning with February cycle  (b) palbociclib held 01/31/2017, with increasing symptoms  (c) palbociclib resumed October 2018 at 75 mg daily  (d) palbociclib dose reduced to 75 mg every other day February through April 2019  (e) palbociclib dose resumed at 75 mg daily as of 10/17/2017  (5) status post double right lumpectomies and right axillary lymph node sampling 01/13/2017 for 2 separate invasive ductal carcinoma lesions, pT1a and pT1b, N1a, with negative margins, both lesions being estrogen and progesterone receptor positive and HER-2 negative  (6) adjuvant radiation completed 07/05/2017 1. 50.4 Gy in 28 fractions to the right breast and supraclavicular region using whole-breast tangent fields. 2. Boost to the seroma delivered an additional 10 Gy in 5 fractions. The total dose was 60.4 Gy.  (7) restaging studies:  (a) CT scan of the chest and bone scan 06/23/2017  showed stable scattered very small lung nodules, no bone lesions  (b) CT of the chest 10/17/2017 showed no new or progressive metastatic disease in the chest. The small left lower lobe pulmonary nodule is stable  (c) PET scan on 03/02/2018: shows no findings for residual or recurrent right breast cancer   (8) right upper pole renal lesion noted to be enlarging on CT scan 06/22/2017  (a) no uptake on PET scan obtained 03/02/2018   PLAN: [name] is now more than 2 years out from definitive diagnosis of metastatic breast cancer, with no clinical evidence of disease activity.  This is very favorable.  She is tolerating the palbociclib and letrozole remarkably well.  We are continuing with the current dose.  Her ANC of course is on the borderline range and we will continue to check her lab work on a monthly basis for close monitoring.  If the tumor marker trends upward or she is no longer able to tolerate the palbociclib because of counts or other issues we will consider Verzenio.  She will have mammography in March and see [place] shortly after  that.  She will then have a bone scan in late September or early October and see me shortly after that.  She knows to call for any other issues that may develop before the next visit here.   Magrinat, Virgie Dad, MD  06/25/18 11:30 AM Medical Oncology and Hematology [facility]: [address] [place], [place] [address] Tel. [phone]    Fax. [phone]   I, Jacqualyn Posey am acting as a Education administrator for Chauncey Cruel, MD.   I, Lurline Del MD, have reviewed the above documentation for accuracy and completeness, and I agree with the above.

## 2018-06-25 ENCOUNTER — Inpatient Hospital Stay (HOSPITAL_BASED_OUTPATIENT_CLINIC_OR_DEPARTMENT_OTHER): Payer: Medicare Other | Admitting: Oncology

## 2018-06-25 ENCOUNTER — Inpatient Hospital Stay: Payer: Medicare Other | Attending: Oncology

## 2018-06-25 ENCOUNTER — Inpatient Hospital Stay: Payer: Medicare Other

## 2018-06-25 ENCOUNTER — Telehealth: Payer: Self-pay | Admitting: Oncology

## 2018-06-25 VITALS — BP 165/89 | HR 63 | Temp 98.4°F | Resp 18 | Ht 64.0 in | Wt 239.6 lb

## 2018-06-25 DIAGNOSIS — C50411 Malignant neoplasm of upper-outer quadrant of right female breast: Secondary | ICD-10-CM | POA: Insufficient documentation

## 2018-06-25 DIAGNOSIS — Z801 Family history of malignant neoplasm of trachea, bronchus and lung: Secondary | ICD-10-CM | POA: Insufficient documentation

## 2018-06-25 DIAGNOSIS — M1990 Unspecified osteoarthritis, unspecified site: Secondary | ICD-10-CM | POA: Diagnosis not present

## 2018-06-25 DIAGNOSIS — E785 Hyperlipidemia, unspecified: Secondary | ICD-10-CM

## 2018-06-25 DIAGNOSIS — C7800 Secondary malignant neoplasm of unspecified lung: Secondary | ICD-10-CM

## 2018-06-25 DIAGNOSIS — Z923 Personal history of irradiation: Secondary | ICD-10-CM

## 2018-06-25 DIAGNOSIS — H409 Unspecified glaucoma: Secondary | ICD-10-CM

## 2018-06-25 DIAGNOSIS — E782 Mixed hyperlipidemia: Secondary | ICD-10-CM

## 2018-06-25 DIAGNOSIS — K219 Gastro-esophageal reflux disease without esophagitis: Secondary | ICD-10-CM | POA: Diagnosis not present

## 2018-06-25 DIAGNOSIS — K760 Fatty (change of) liver, not elsewhere classified: Secondary | ICD-10-CM

## 2018-06-25 DIAGNOSIS — Z79899 Other long term (current) drug therapy: Secondary | ICD-10-CM | POA: Diagnosis not present

## 2018-06-25 DIAGNOSIS — Z8673 Personal history of transient ischemic attack (TIA), and cerebral infarction without residual deficits: Secondary | ICD-10-CM | POA: Diagnosis not present

## 2018-06-25 DIAGNOSIS — E119 Type 2 diabetes mellitus without complications: Secondary | ICD-10-CM | POA: Insufficient documentation

## 2018-06-25 DIAGNOSIS — Z79811 Long term (current) use of aromatase inhibitors: Secondary | ICD-10-CM

## 2018-06-25 DIAGNOSIS — Z7984 Long term (current) use of oral hypoglycemic drugs: Secondary | ICD-10-CM | POA: Diagnosis not present

## 2018-06-25 DIAGNOSIS — R5383 Other fatigue: Secondary | ICD-10-CM | POA: Diagnosis not present

## 2018-06-25 DIAGNOSIS — Z7982 Long term (current) use of aspirin: Secondary | ICD-10-CM | POA: Insufficient documentation

## 2018-06-25 DIAGNOSIS — Z803 Family history of malignant neoplasm of breast: Secondary | ICD-10-CM | POA: Diagnosis not present

## 2018-06-25 DIAGNOSIS — Z90710 Acquired absence of both cervix and uterus: Secondary | ICD-10-CM | POA: Diagnosis not present

## 2018-06-25 DIAGNOSIS — Z170 Estrogen receptor positive status [ER+]: Secondary | ICD-10-CM | POA: Diagnosis not present

## 2018-06-25 DIAGNOSIS — Z8542 Personal history of malignant neoplasm of other parts of uterus: Secondary | ICD-10-CM

## 2018-06-25 DIAGNOSIS — I10 Essential (primary) hypertension: Secondary | ICD-10-CM | POA: Diagnosis not present

## 2018-06-25 DIAGNOSIS — I700 Atherosclerosis of aorta: Secondary | ICD-10-CM

## 2018-06-25 DIAGNOSIS — C7801 Secondary malignant neoplasm of right lung: Secondary | ICD-10-CM

## 2018-06-25 LAB — CBC WITH DIFFERENTIAL/PLATELET
Abs Immature Granulocytes: 0 10*3/uL (ref 0.00–0.07)
Basophils Absolute: 0 10*3/uL (ref 0.0–0.1)
Basophils Relative: 1 %
Eosinophils Absolute: 0 10*3/uL (ref 0.0–0.5)
Eosinophils Relative: 1 %
HCT: 34.8 % — ABNORMAL LOW (ref 36.0–46.0)
Hemoglobin: 11.9 g/dL — ABNORMAL LOW (ref 12.0–15.0)
Immature Granulocytes: 0 %
Lymphocytes Relative: 30 %
Lymphs Abs: 0.6 10*3/uL — ABNORMAL LOW (ref 0.7–4.0)
MCH: 32.6 pg (ref 26.0–34.0)
MCHC: 34.2 g/dL (ref 30.0–36.0)
MCV: 95.3 fL (ref 80.0–100.0)
Monocytes Absolute: 0.2 10*3/uL (ref 0.1–1.0)
Monocytes Relative: 10 %
Neutro Abs: 1.1 10*3/uL — ABNORMAL LOW (ref 1.7–7.7)
Neutrophils Relative %: 58 %
Platelets: 138 10*3/uL — ABNORMAL LOW (ref 150–400)
RBC: 3.65 MIL/uL — ABNORMAL LOW (ref 3.87–5.11)
RDW: 14.8 % (ref 11.5–15.5)
WBC: 1.9 10*3/uL — ABNORMAL LOW (ref 4.0–10.5)
nRBC: 0 % (ref 0.0–0.2)

## 2018-06-25 LAB — COMPREHENSIVE METABOLIC PANEL
ALT: 15 U/L (ref 0–44)
AST: 11 U/L — ABNORMAL LOW (ref 15–41)
Albumin: 4 g/dL (ref 3.5–5.0)
Alkaline Phosphatase: 71 U/L (ref 38–126)
Anion gap: 8 (ref 5–15)
BUN: 11 mg/dL (ref 8–23)
CO2: 24 mmol/L (ref 22–32)
Calcium: 9.1 mg/dL (ref 8.9–10.3)
Chloride: 108 mmol/L (ref 98–111)
Creatinine, Ser: 0.85 mg/dL (ref 0.44–1.00)
GFR calc Af Amer: >60 mL/min (ref >60)
GFR calc non Af Amer: >60 mL/min (ref >60)
Glucose, Bld: 193 mg/dL — ABNORMAL HIGH (ref 70–99)
Potassium: 4.1 mmol/L (ref 3.5–5.1)
Sodium: 140 mmol/L (ref 135–145)
Total Bilirubin: 0.6 mg/dL (ref 0.3–1.2)
Total Protein: 7 g/dL (ref 6.5–8.1)

## 2018-06-25 MED FILL — IBRANCE 75 MG CAPSULE: 75 | 28 days supply | Qty: 21 | Fill #3

## 2018-06-25 NOTE — Telephone Encounter (Signed)
Gave avs and calendar ° °

## 2018-06-26 LAB — CANCER ANTIGEN 27.29: CA 27.29: 28.2 U/mL (ref 0.0–38.6)

## 2018-06-29 ENCOUNTER — Telehealth: Payer: Self-pay | Admitting: Adult Health

## 2018-06-29 NOTE — Telephone Encounter (Signed)
Patient called to reschedule  °

## 2018-07-23 ENCOUNTER — Other Ambulatory Visit: Payer: Medicare Other

## 2018-07-24 ENCOUNTER — Other Ambulatory Visit: Payer: Medicare Other

## 2018-07-25 ENCOUNTER — Inpatient Hospital Stay: Payer: Medicare Other | Attending: Oncology

## 2018-07-25 DIAGNOSIS — K760 Fatty (change of) liver, not elsewhere classified: Secondary | ICD-10-CM

## 2018-07-25 DIAGNOSIS — Z79811 Long term (current) use of aromatase inhibitors: Secondary | ICD-10-CM | POA: Diagnosis not present

## 2018-07-25 DIAGNOSIS — Z79899 Other long term (current) drug therapy: Secondary | ICD-10-CM | POA: Insufficient documentation

## 2018-07-25 DIAGNOSIS — Z170 Estrogen receptor positive status [ER+]: Secondary | ICD-10-CM | POA: Diagnosis not present

## 2018-07-25 DIAGNOSIS — C50411 Malignant neoplasm of upper-outer quadrant of right female breast: Secondary | ICD-10-CM | POA: Diagnosis not present

## 2018-07-25 DIAGNOSIS — Z8542 Personal history of malignant neoplasm of other parts of uterus: Secondary | ICD-10-CM

## 2018-07-25 DIAGNOSIS — C7801 Secondary malignant neoplasm of right lung: Secondary | ICD-10-CM

## 2018-07-25 LAB — COMPREHENSIVE METABOLIC PANEL
ALT: 13 U/L (ref 0–44)
AST: 11 U/L — ABNORMAL LOW (ref 15–41)
Albumin: 3.7 g/dL (ref 3.5–5.0)
Alkaline Phosphatase: 63 U/L (ref 38–126)
Anion gap: 7 (ref 5–15)
BUN: 14 mg/dL (ref 8–23)
CO2: 26 mmol/L (ref 22–32)
Calcium: 8.9 mg/dL (ref 8.9–10.3)
Chloride: 108 mmol/L (ref 98–111)
Creatinine, Ser: 0.87 mg/dL (ref 0.44–1.00)
GFR calc Af Amer: >60 mL/min (ref >60)
GFR calc non Af Amer: >60 mL/min (ref >60)
Glucose, Bld: 245 mg/dL — ABNORMAL HIGH (ref 70–99)
Potassium: 4.3 mmol/L (ref 3.5–5.1)
Sodium: 141 mmol/L (ref 135–145)
Total Bilirubin: 0.5 mg/dL (ref 0.3–1.2)
Total Protein: 6.6 g/dL (ref 6.5–8.1)

## 2018-07-25 LAB — CBC WITH DIFFERENTIAL/PLATELET
Abs Immature Granulocytes: 0.01 10*3/uL (ref 0.00–0.07)
Basophils Absolute: 0 10*3/uL (ref 0.0–0.1)
Basophils Relative: 1 %
Eosinophils Absolute: 0 10*3/uL (ref 0.0–0.5)
Eosinophils Relative: 2 %
HCT: 33.3 % — ABNORMAL LOW (ref 36.0–46.0)
Hemoglobin: 11.5 g/dL — ABNORMAL LOW (ref 12.0–15.0)
Immature Granulocytes: 1 %
Lymphocytes Relative: 40 %
Lymphs Abs: 0.6 10*3/uL — ABNORMAL LOW (ref 0.7–4.0)
MCH: 32.6 pg (ref 26.0–34.0)
MCHC: 34.5 g/dL (ref 30.0–36.0)
MCV: 94.3 fL (ref 80.0–100.0)
Monocytes Absolute: 0.2 10*3/uL (ref 0.1–1.0)
Monocytes Relative: 14 %
Neutro Abs: 0.6 10*3/uL — ABNORMAL LOW (ref 1.7–7.7)
Neutrophils Relative %: 42 %
Platelets: 132 10*3/uL — ABNORMAL LOW (ref 150–400)
RBC: 3.53 MIL/uL — ABNORMAL LOW (ref 3.87–5.11)
RDW: 14.8 % (ref 11.5–15.5)
WBC: 1.5 10*3/uL — ABNORMAL LOW (ref 4.0–10.5)
nRBC: 0 % (ref 0.0–0.2)

## 2018-07-25 MED FILL — IBRANCE 75 MG CAPSULE: 75 | 28 days supply | Qty: 21 | Fill #4

## 2018-07-26 LAB — CANCER ANTIGEN 27.29: CA 27.29: 25.5 U/mL (ref 0.0–38.6)

## 2018-07-27 ENCOUNTER — Telehealth: Payer: Self-pay | Admitting: *Deleted

## 2018-07-27 NOTE — Telephone Encounter (Signed)
This RN spoke with pt per her call regarding results of CBC with restart of next cycle due 2/16.  Noted ANC of 0.6  Per discussion including pt stating she has a " cold with a cough " - she will hold restart and come in 08/02/2018 for recheck.  Noted as well was blood sugar of 225 with pt stating she has been using cough syrup with concentrated sugar.  No other needs at this time.  Appointment for recheck entered by this RN.

## 2018-08-02 ENCOUNTER — Telehealth: Payer: Self-pay | Admitting: *Deleted

## 2018-08-02 ENCOUNTER — Inpatient Hospital Stay: Payer: Medicare Other

## 2018-08-02 ENCOUNTER — Telehealth: Payer: Self-pay | Admitting: Oncology

## 2018-08-02 DIAGNOSIS — Z79899 Other long term (current) drug therapy: Secondary | ICD-10-CM | POA: Diagnosis not present

## 2018-08-02 DIAGNOSIS — Z170 Estrogen receptor positive status [ER+]: Secondary | ICD-10-CM | POA: Diagnosis not present

## 2018-08-02 DIAGNOSIS — C50411 Malignant neoplasm of upper-outer quadrant of right female breast: Secondary | ICD-10-CM | POA: Diagnosis not present

## 2018-08-02 DIAGNOSIS — C7801 Secondary malignant neoplasm of right lung: Secondary | ICD-10-CM

## 2018-08-02 DIAGNOSIS — Z8542 Personal history of malignant neoplasm of other parts of uterus: Secondary | ICD-10-CM

## 2018-08-02 DIAGNOSIS — Z79811 Long term (current) use of aromatase inhibitors: Secondary | ICD-10-CM | POA: Diagnosis not present

## 2018-08-02 LAB — CBC WITH DIFFERENTIAL/PLATELET
Abs Immature Granulocytes: 0.04 10*3/uL (ref 0.00–0.07)
Basophils Absolute: 0 10*3/uL (ref 0.0–0.1)
Basophils Relative: 2 %
Eosinophils Absolute: 0 10*3/uL (ref 0.0–0.5)
Eosinophils Relative: 1 %
HCT: 33.7 % — ABNORMAL LOW (ref 36.0–46.0)
Hemoglobin: 11.4 g/dL — ABNORMAL LOW (ref 12.0–15.0)
Immature Granulocytes: 2 %
Lymphocytes Relative: 33 %
Lymphs Abs: 0.9 10*3/uL (ref 0.7–4.0)
MCH: 32.1 pg (ref 26.0–34.0)
MCHC: 33.8 g/dL (ref 30.0–36.0)
MCV: 94.9 fL (ref 80.0–100.0)
Monocytes Absolute: 0.3 10*3/uL (ref 0.1–1.0)
Monocytes Relative: 12 %
Neutro Abs: 1.4 10*3/uL — ABNORMAL LOW (ref 1.7–7.7)
Neutrophils Relative %: 50 %
Platelets: 181 10*3/uL (ref 150–400)
RBC: 3.55 MIL/uL — ABNORMAL LOW (ref 3.87–5.11)
RDW: 14.5 % (ref 11.5–15.5)
WBC: 2.7 10*3/uL — ABNORMAL LOW (ref 4.0–10.5)
nRBC: 0 % (ref 0.0–0.2)

## 2018-08-02 NOTE — Telephone Encounter (Signed)
Pt came in for lab today per hold on Ibrance last week due to decreased [place] and pt having active chest congestion.  Noted ANC today of 1.4.  Chest congestion has improved.  [name] will start her next 21 day cycle tomorrow.   This RN sent inbox request to reschedule appointments appropriately per Ibrance cycle.

## 2018-08-02 NOTE — Telephone Encounter (Signed)
Scheduled appt per 2/20 sch message - pt is aware of appt date and time   

## 2018-08-20 ENCOUNTER — Ambulatory Visit: Payer: Medicare Other | Admitting: Adult Health

## 2018-08-20 ENCOUNTER — Other Ambulatory Visit: Payer: Medicare Other

## 2018-08-27 ENCOUNTER — Other Ambulatory Visit: Payer: Self-pay

## 2018-08-27 ENCOUNTER — Encounter: Payer: Self-pay | Admitting: Adult Health

## 2018-08-27 ENCOUNTER — Inpatient Hospital Stay: Payer: Medicare Other | Attending: Oncology

## 2018-08-27 ENCOUNTER — Inpatient Hospital Stay (HOSPITAL_BASED_OUTPATIENT_CLINIC_OR_DEPARTMENT_OTHER): Payer: Medicare Other | Admitting: Adult Health

## 2018-08-27 VITALS — BP 170/70 | HR 61 | Temp 98.4°F | Resp 18 | Ht 64.0 in | Wt 241.6 lb

## 2018-08-27 DIAGNOSIS — L659 Nonscarring hair loss, unspecified: Secondary | ICD-10-CM

## 2018-08-27 DIAGNOSIS — Z803 Family history of malignant neoplasm of breast: Secondary | ICD-10-CM | POA: Diagnosis not present

## 2018-08-27 DIAGNOSIS — E119 Type 2 diabetes mellitus without complications: Secondary | ICD-10-CM

## 2018-08-27 DIAGNOSIS — Z85118 Personal history of other malignant neoplasm of bronchus and lung: Secondary | ICD-10-CM | POA: Insufficient documentation

## 2018-08-27 DIAGNOSIS — R5381 Other malaise: Secondary | ICD-10-CM

## 2018-08-27 DIAGNOSIS — I10 Essential (primary) hypertension: Secondary | ICD-10-CM | POA: Diagnosis not present

## 2018-08-27 DIAGNOSIS — R5383 Other fatigue: Secondary | ICD-10-CM | POA: Insufficient documentation

## 2018-08-27 DIAGNOSIS — Z86718 Personal history of other venous thrombosis and embolism: Secondary | ICD-10-CM | POA: Diagnosis not present

## 2018-08-27 DIAGNOSIS — G893 Neoplasm related pain (acute) (chronic): Secondary | ICD-10-CM

## 2018-08-27 DIAGNOSIS — Z8542 Personal history of malignant neoplasm of other parts of uterus: Secondary | ICD-10-CM | POA: Diagnosis not present

## 2018-08-27 DIAGNOSIS — Z79811 Long term (current) use of aromatase inhibitors: Secondary | ICD-10-CM | POA: Diagnosis not present

## 2018-08-27 DIAGNOSIS — Z170 Estrogen receptor positive status [ER+]: Secondary | ICD-10-CM

## 2018-08-27 DIAGNOSIS — C50411 Malignant neoplasm of upper-outer quadrant of right female breast: Secondary | ICD-10-CM

## 2018-08-27 DIAGNOSIS — Z801 Family history of malignant neoplasm of trachea, bronchus and lung: Secondary | ICD-10-CM

## 2018-08-27 DIAGNOSIS — Z923 Personal history of irradiation: Secondary | ICD-10-CM

## 2018-08-27 DIAGNOSIS — Z79899 Other long term (current) drug therapy: Secondary | ICD-10-CM | POA: Diagnosis not present

## 2018-08-27 DIAGNOSIS — Z7982 Long term (current) use of aspirin: Secondary | ICD-10-CM | POA: Diagnosis not present

## 2018-08-27 DIAGNOSIS — C7801 Secondary malignant neoplasm of right lung: Secondary | ICD-10-CM

## 2018-08-27 DIAGNOSIS — K760 Fatty (change of) liver, not elsewhere classified: Secondary | ICD-10-CM

## 2018-08-27 LAB — CBC WITH DIFFERENTIAL/PLATELET
Abs Immature Granulocytes: 0.01 10*3/uL (ref 0.00–0.07)
Basophils Absolute: 0 10*3/uL (ref 0.0–0.1)
Basophils Relative: 1 %
Eosinophils Absolute: 0 10*3/uL (ref 0.0–0.5)
Eosinophils Relative: 2 %
HCT: 36.8 % (ref 36.0–46.0)
Hemoglobin: 12.5 g/dL (ref 12.0–15.0)
Immature Granulocytes: 0 %
Lymphocytes Relative: 37 %
Lymphs Abs: 0.9 10*3/uL (ref 0.7–4.0)
MCH: 32.2 pg (ref 26.0–34.0)
MCHC: 34 g/dL (ref 30.0–36.0)
MCV: 94.8 fL (ref 80.0–100.0)
Monocytes Absolute: 0.3 10*3/uL (ref 0.1–1.0)
Monocytes Relative: 13 %
Neutro Abs: 1.2 10*3/uL — ABNORMAL LOW (ref 1.7–7.7)
Neutrophils Relative %: 47 %
Platelets: 132 10*3/uL — ABNORMAL LOW (ref 150–400)
RBC: 3.88 MIL/uL (ref 3.87–5.11)
RDW: 14.8 % (ref 11.5–15.5)
WBC: 2.5 10*3/uL — ABNORMAL LOW (ref 4.0–10.5)
nRBC: 0 % (ref 0.0–0.2)

## 2018-08-27 MED FILL — IBRANCE 75 MG CAPSULE: 75 | 28 days supply | Qty: 21 | Fill #5

## 2018-08-27 NOTE — Progress Notes (Signed)
[place]  [phone] Fax:(336) [phone]     ID: JIAH BARI DOB: 1945/07/04  MR#: [id number]  EHU#:[id number]  Patient Care Team: Biagio Borg, MD as PCP - Stevan Born, MD as Consulting Physician (General Surgery) Magrinat, Virgie Dad, MD as Consulting Physician (Oncology) Kyung Rudd, MD as Consulting Physician (Radiation Oncology) Bobbye Charleston, MD as Consulting Physician (Obstetrics and Gynecology) Nada Libman, MD as Referring Physician (Specialist) Vevelyn Royals, MD as Consulting Physician (Ophthalmology) Lamonte Sakai Rose Fillers, MD as Consulting Physician (Pulmonary Disease) Alexis Frock, MD as Consulting Physician (Urology) OTHER MD:  CHIEF COMPLAINT: Estrogen receptor positive breast cancer  CURRENT TREATMENT: Letrozole, palbociclib   INTERVAL HISTORY: [name] returns today for follow-up and treatment of her estrogen receptor positive stage IV breast cancer.  The patient continues on letrozole. She tolerates this well and without any noticeable side effects.    The patient also continues on palbociclib. She has started to feel more poorly lately and is due to restart Ibrance on Thursday.    Since her last visit here, she has not undergone any additional studies.  She has bilateral mammography in May.  Her most recent scan was PET scan in September 2019 which showed no active disease.  Her tumor markers have remained normal.   REVIEW OF SYSTEMS: [name] had a difficult time getting her blood drawn today.  She was stuck 7 times.  She notes she is eating more salad and drinking more water.  [name] feels tired.  She says she has more hair thinning, and notes that when she talked to [facility], she was recommended thyroid testing.  She says she doesn't feel sick, but she doesn't feel good either.  She has some breast pain that has been present since her diagnosis.    [name] is feeling ok otherwise.  She denies fevers or chills.  She is  without chest pain, palpitations, cough, or shortness of breath.  She hasn't noted bowel/bladder changes, nausea, or vomiting.  A detailed ROS was otherwise non contributory.     BREAST CANCER HISTORY: From the original intake note:  Shatori had screening mammography showing some suspicious calcifications in the right breast leading to right diagnostic mammography with ultrasonography 01/22/2016 at [facility]. The breast density was category C. In the upper right breast there was a 2.3 cm mass with additional masses measuring 0.9 and 0.7 cm. There was also a possible additional 0.8 mass in the lower inner quadrant. Ultrasound confirmed an irregular hypoechoic mass in the right breast upper outer quadrant measuring 2.0 cm. There were other masses measuring 0.7 and 0.8 cm by ultrasonography. The right axilla was sonographically benign.  Biopsy of a 12:00 and 4:00 mass in the right breast 01/22/2016 showed ([id number]) both specimens showing invasive ductal carcinoma, grade 1 or 2, both 95% estrogen receptor positive, both 95% progesterone receptor positive, both with strong staining intensity, with MIB-1 ranging from 10-15%, and both HER-2 negative, the signals ratio being 1.23-1.42, and the number per cell 1.85-2.59.  Her subsequent history is as detailed below    PAST MEDICAL HISTORY: Past Medical History:  Diagnosis Date  . Breast cancer ([place])   . Cancer ([place]) 02/2016   right breast  . DIABETES MELLITUS, TYPE II 01/04/2007   only takes actoplus daily  . Dizziness and giddiness 02/29/2008  . DVT, HX OF    at age 43 in right buttocks  . Dyspnea    due to lung cancer  . Family history of breast  cancer   . GERD 01/04/2007   pt reports resolved   . GLAUCOMA 07/30/2008   both eyes  . History of blood transfusion    no abnormal  reaction  . History of uterine cancer 2000   hysterectomy done  . HYPERLIPIDEMIA 01/04/2007   taking Pravastatin daily  . HYPERTENSION 01/04/2007   takes Lisinopril  daily  . Joint pain   . Joint swelling   . Leg cramps   . LEG PAIN, LEFT 07/06/2007  . NUMBNESS 07/30/2008   in fingers;pt states from Diamox  . OSTEOARTHRITIS, HIP 09/25/2009  . OTITIS MEDIA, ACUTE, BILATERAL 02/29/2008  . Overweight(278.02) 01/04/2007  . Peripheral vascular disease ([place])   . Personal history of radiation therapy 2018  . Pneumonia   . PONV (postoperative nausea and vomiting)   . SLEEP APNEA, OBSTRUCTIVE    doesn't use a cpap;study done about 70yr ago  . TRANSIENT ISCHEMIC ATTACK, HX OF 01/04/2007  . Vision loss    left eye    PAST SURGICAL HISTORY: Past Surgical History:  Procedure Laterality Date  . ABDOMINAL HYSTERECTOMY  2000  . BREAST LUMPECTOMY Right 01/13/2017   x2  . BREAST LUMPECTOMY WITH RADIOACTIVE SEED AND SENTINEL LYMPH NODE BIOPSY Right 01/13/2017   Procedure: RIGHT BREAST RADIOACTIVE SEED X'S 2 GUIDED LUMPECTOMY WITH RADIOACTIVE SEED TARGETED AXILLARYLYMPH NODE EXCISION AND RIGHT AXILLARY SENTINEL LYMPH NODE BIOPSY;  Surgeon: NAlphonsa Overall MD;  [place] [facility]  Service: General;  Laterality: Right;  2 SEEDS IN RIGHT BREAST 1 SEED IN RIGHT AXILLARY NODE  . CHOLECYSTECTOMY    . ENDOBRONCHIAL ULTRASOUND Bilateral 03/28/2016   Procedure: ENDOBRONCHIAL ULTRASOUND;  Surgeon: RCollene Gobble MD;  Location: [facility];  Service: Cardiopulmonary;  Laterality: Bilateral;  . EYE SURGERY  13   shunt left and lazer eye surgery on right cataract and retenia tear with repair  . growth removal  2004   from thumb  . KNEE ARTHROSCOPY Right   . mulitple eye surgeries     both eyes, cataracts with ioc done both eyes  . OOPHORECTOMY    . right lumpectomy with axillary node dissection Right 01/2017  . TOTAL HIP ARTHROPLASTY  06/24/2011   Procedure: TOTAL HIP ARTHROPLASTY;  Surgeon: FKerin Salen  [place] [facility]  Service: Orthopedics;  Laterality: Right;  . TOTAL HIP ARTHROPLASTY Left 11/12/2012   Dr RMayer Camel . TOTAL HIP ARTHROPLASTY Left 11/12/2012   Procedure:  TOTAL HIP ARTHROPLASTY;  Surgeon: FKerin Salen MD;  [place] [facility]  Service: Orthopedics;  Laterality: Left;  DEPUY PINNACLE    FAMILY HISTORY Family History  Problem Relation Age of Onset  . Dementia Mother   . Cancer Mother        Breast and lung cancer  . Stroke Sister   . Breast cancer Sister 568 . Heart attack Maternal Aunt   . Lung cancer Maternal Grandmother        non smoker  . Glaucoma Maternal Grandfather   . Anesthesia problems Neg Hx   The patient's father died at age 73 the patient's mother died at age 73 She had breast and lung cancers diagnosed shortly before her death. The patient had no brothers, 2 sisters. One sister was diagnosed with breast cancer at the age of 537   GYNECOLOGIC HISTORY:  No LMP recorded. Patient has had a hysterectomy. Menarche age 73 first live birth age 73 the patient is GX P1. She had a hysterectomy for endometrial cancer in the year 2000.  She did not take hormone replacement. She did use oral contraceptives for more than 20 years remotely, with no complications.   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 She is home with [name]. He is a retired Dealer.Their son [name] also lives in [place].    ADVANCED DIRECTIVES: In place  HEALTH MAINTENANCE: Social History   Tobacco Use  . Smoking status: Never Smoker  . Smokeless tobacco: Never Used  Substance Use Topics  . Alcohol use: No  . Drug use: No     Colonoscopy: Never  PAP: Status post hysterectomy  Bone density: Remote   Allergies  Allergen Reactions  . Codeine Hives    Hycodan syrup  . Fluorescein Nausea And Vomiting    ? IV dye for retina specialist  . Lipitor [Atorvastatin Calcium]     Leg cramp  . Oxycodone Nausea And Vomiting    Patient vomited for 3 days after taking  . Sitagliptin Phosphate Nausea And Vomiting  . Sulfa Drugs Cross Reactors Nausea And  Vomiting    Current Outpatient Medications  Medication Sig Dispense Refill  . IBRANCE 75 MG capsule TAKE 1 CAPSULE (75 MG TOTAL) BY MOUTH DAILY WITH BREAKFAST. TAKE WHOLE WITH FOOD. 21 capsule 6  . acetaminophen (TYLENOL) 500 MG tablet Take 1,000 mg by mouth every 4 (four) hours as needed for moderate pain or fever.    Marland Kitchen aspirin EC 81 MG tablet Take 81 mg by mouth daily at 6 PM. 1700    . bimatoprost (LUMIGAN) 0.01 % SOLN Place 1 drop into both eyes at bedtime.    . brimonidine-timolol (COMBIGAN) 0.2-0.5 % ophthalmic solution Place 1 drop into both eyes three times daily    . cholecalciferol (VITAMIN D) 1000 units tablet Take 1,000 Units by mouth daily.    . dorzolamide-timolol (COSOPT) 22.3-6.8 MG/ML ophthalmic solution Place 1 drop into both eyes 2 (two) times daily.    Marland Kitchen letrozole (FEMARA) 2.5 MG tablet Take 1 tablet (2.5 mg total) by mouth at bedtime. 90 tablet 3  . lisinopril (PRINIVIL,ZESTRIL) 20 MG tablet Take 1 tablet (20 mg total) by mouth daily. 90 tablet 3  . lovastatin (MEVACOR) 20 MG tablet Take 1 tablet (20 mg total) by mouth every evening. 90 tablet 3  . pioglitazone-metformin (ACTOPLUS MET) 15-500 MG tablet Take 1 tablet by mouth daily. 90 tablet 3   No current facility-administered medications for this visit.      OBJECTIVE: Vitals:   08/27/18 1356  BP: (!) 189/82  Pulse: 61  Resp: 18  Temp: 98.4 F (36.9 C)  SpO2: 100%     Body mass index is 41.47 kg/m.    ECOG FS:1 - Symptomatic but completely ambulatory Filed Weights   08/27/18 1356  Weight: 241 lb 9.6 oz (109.6 kg)   GENERAL: Patient is a tired appearing female in no acute distress HEENT:  Sclerae anicteric.  Oropharynx clear and moist. No ulcerations or evidence of oropharyngeal candidiasis. Neck is supple.  NODES:  No cervical, supraclavicular, or axillary lymphadenopathy palpated.  BREAST EXAM:  Deferred. LUNGS:  Clear to auscultation bilaterally.  No wheezes or rhonchi. HEART:  Regular rate and rhythm.  No murmur appreciated. ABDOMEN:  Soft, nontender.  Positive, normoactive bowel sounds. No organomegaly palpated. MSK:  No focal spinal tenderness to palpation. Full range of motion bilaterally in the upper extremities. EXTREMITIES:  No peripheral edema.   SKIN:  Clear with no obvious rashes or skin changes. No nail dyscrasia. NEURO:  Nonfocal. Well oriented.  Slightly abrupt tone today.      LAB RESULTS:  CMP     Component Value Date/Time   NA 141 07/25/2018 1133   NA 140 05/29/2017 1254   K 4.3 07/25/2018 1133   K 4.4 05/29/2017 1254   CL 108 07/25/2018 1133   CO2 26 07/25/2018 1133   CO2 25 05/29/2017 1254   GLUCOSE 245 (H) 07/25/2018 1133   GLUCOSE 154 (H) 05/29/2017 1254   BUN 14 07/25/2018 1133   BUN 11.5 05/29/2017 1254   CREATININE 0.87 07/25/2018 1133   CREATININE 0.80 04/02/2018 1116   CREATININE 0.8 05/29/2017 1254   CALCIUM 8.9 07/25/2018 1133   CALCIUM 9.3 05/29/2017 1254   PROT 6.6 07/25/2018 1133   PROT 7.0 05/29/2017 1254   ALBUMIN 3.7 07/25/2018 1133   ALBUMIN 4.1 05/29/2017 1254   AST 11 (L) 07/25/2018 1133   AST 13 (L) 04/02/2018 1116   AST 13 05/29/2017 1254   ALT 13 07/25/2018 1133   ALT 15 04/02/2018 1116   ALT 14 05/29/2017 1254   ALKPHOS 63 07/25/2018 1133   ALKPHOS 66 05/29/2017 1254   BILITOT 0.5 07/25/2018 1133   BILITOT 0.4 04/02/2018 1116   BILITOT 0.50 05/29/2017 1254   GFRNONAA >60 07/25/2018 1133   GFRNONAA >60 04/02/2018 1116   GFRAA >60 07/25/2018 1133   GFRAA >60 04/02/2018 1116    INo results found for: SPEP, UPEP  Lab Results  Component Value Date   WBC 2.5 (L) 08/27/2018   NEUTROABS 1.2 (L) 08/27/2018   HGB 12.5 08/27/2018   HCT 36.8 08/27/2018   MCV 94.8 08/27/2018   PLT 132 (L) 08/27/2018      Chemistry      Component Value Date/Time   NA 141 07/25/2018 1133   NA 140 05/29/2017 1254   K 4.3 07/25/2018 1133   K 4.4 05/29/2017 1254   CL 108 07/25/2018 1133   CO2 26 07/25/2018 1133   CO2 25 05/29/2017 1254    BUN 14 07/25/2018 1133   BUN 11.5 05/29/2017 1254   CREATININE 0.87 07/25/2018 1133   CREATININE 0.80 04/02/2018 1116   CREATININE 0.8 05/29/2017 1254      Component Value Date/Time   CALCIUM 8.9 07/25/2018 1133   CALCIUM 9.3 05/29/2017 1254   ALKPHOS 63 07/25/2018 1133   ALKPHOS 66 05/29/2017 1254   AST 11 (L) 07/25/2018 1133   AST 13 (L) 04/02/2018 1116   AST 13 05/29/2017 1254   ALT 13 07/25/2018 1133   ALT 15 04/02/2018 1116   ALT 14 05/29/2017 1254   BILITOT 0.5 07/25/2018 1133   BILITOT 0.4 04/02/2018 1116   BILITOT 0.50 05/29/2017 1254       No results found for: LABCA2  No components found for: LABCA125  No results for input(s): INR in the last 168 hours.  Urinalysis    Component Value Date/Time   COLORURINE YELLOW 08/18/2017 1322   APPEARANCEUR HAZY (A) 08/18/2017 1322   LABSPEC 1.010 08/18/2017 1322   PHURINE 7.0 08/18/2017 1322   GLUCOSEU NEGATIVE 08/18/2017 1322   GLUCOSEU NEGATIVE 10/30/2014 1513   HGBUR LARGE (A) 08/18/2017 1322   BILIRUBINUR NEGATIVE 08/18/2017 1322   KETONESUR NEGATIVE 08/18/2017 1322   PROTEINUR 30 (A) 08/18/2017 1322   UROBILINOGEN 0.2 10/30/2014 1513   NITRITE NEGATIVE 08/18/2017 1322   LEUKOCYTESUR LARGE (A) 08/18/2017 1322    STUDIES: No results found.  ELIGIBLE FOR AVAILABLE RESEARCH PROTOCOL: no  ASSESSMENT: 73 y.o. [place] woman with a remote history of early stage endometrial cancer, now status post right breast upper outer quadrant biopsy 01/22/2016 for a clinically multifocal T2 N0, stage 2A invasive ductal carcinoma, grade 1, estrogen and progesterone receptor positive, HER-2 negative, with an MIB-1 between 10 and 15%.  (1) right axillary lymph node biopsy 03/02/2016 positive  (2) genetics testing 01/13/2016 through the Custom gene panel offered by [facility] found no deleterious mutations in  ATM, BARD1, BRCA1, BRCA2, BRIP1, CDH1, CHEK2, EPCAM, FANCC, MLH1, MSH2, MSH6, MUTYH, NBN, PALB2, PMS2, POLD1, PTEN,  RAD51C, RAD51D, TP53, and XRCC2  METASTATIC DISEASE: OCT 2017 (3) CT scans of the chest abdomen and pelvis obtained 03/10/2016 are consistent with bilateral lung metastases and mediastinal and hilar nodal involvement, but no liver or bone spread  (a) bronchoscopic lymph node biopsy 2 (station 7, 13R) 03/28/2016 confirms metastatic adenocarcinoma, estrogen receptor positive, HER-2 not amplified  (b) baseline CA-27-29 on 04/18/2016 was 137.5.  (4) letrozole started 03/15/2016, palbociclib added 03/29/2016 at 125 mg/day, 21/7  (a) dose decreased to 100 mg per day, 21/7, beginning with February cycle  (b) palbociclib held 01/31/2017, with increasing symptoms  (c) palbociclib resumed October 2018 at 75 mg daily  (d) palbociclib dose reduced to 75 mg every other day February through April 2019  (e) palbociclib dose resumed at 75 mg daily as of 10/17/2017  (5) status post double right lumpectomies and right axillary lymph node sampling 01/13/2017 for 2 separate invasive ductal carcinoma lesions, pT1a and pT1b, N1a, with negative margins, both lesions being estrogen and progesterone receptor positive and HER-2 negative  (6) adjuvant radiation completed 07/05/2017 1. 50.4 Gy in 28 fractions to the right breast and supraclavicular region using whole-breast tangent fields. 2. Boost to the seroma delivered an additional 10 Gy in 5 fractions. The total dose was 60.4 Gy.  (7) restaging studies:  (a) CT scan of the chest and bone scan 06/23/2017 showed stable scattered very small lung nodules, no bone lesions  (b) CT of the chest 10/17/2017 showed no new or progressive metastatic disease in the chest. The small left lower lobe pulmonary nodule is stable  (c) PET scan on 03/02/2018: shows no findings for residual or recurrent right breast cancer   (8) right upper pole renal lesion noted to be enlarging on CT scan 06/22/2017  (a) no uptake on PET scan obtained 03/02/2018   PLAN: [name] is doing well  today.  She has no clinical signs of progression today.  Her ANC is 1.2  Today and I reviewed this with her in detail.  She will restart her Palbociclib on Thursday and she will continue taking her letrozole.  [name] has had worsening with hair thinning.  I added a TSH level to be drawn with her next set of labs.  She does not want to get any additional sticks today. Unfortunately, the only labs we were able to get this time were CBC.  We will test her thyroid with her next labs.  I noted that sometimes the Letrozole and palbociclib can worsen the hair thinning.  [name] does not think this is the case, since she has been on these two medications for quite some time.  I apologized to [name] for the many sticks that she had to have for lab.  She was very frustrated about this.  [name] also had several questions about her blood pressure and what blood pressure means.  As I explained this  to her, she says that she knows what blood pressure means, but she wanted to know what a blood pressure of 84 systolic meant.  I reviewed the different blood pressure whether elevated or decreased can be caused by different things.  Her systolic blood pressure is 189 today.  She takes lisinopril '20mg'$  daily.  She was also stuck several times in lab before coming over here to get her blood pressure checked.  She doesn't have any symptoms of increased blood pressure.  My nurse [name] will recheck her blood pressure before she leaves today.    [name] notes some differences in billing amongst providers, and I attempted to offer explanations for reasons, which depend on what is covered in each visit, or how much time is spent in each visit.  She did not let me finish reviewing this with her.    [name] will return in 4 weeks for lab only and in 8 weeks for labs, and f/u with Dr. Jana Hakim.  She knows to call for any other issues that may develop before the next visit here.  A total of (20) minutes of face-to-face time was spent with this  patient with greater than 50% of that time in counseling and care-coordination.    Shannon Bihari, NP  08/27/18 2:40 PM Medical Oncology and Hematology [facility] [address] [place], [place] [address] Tel. [phone]    Fax. [phone]

## 2018-09-03 ENCOUNTER — Telehealth: Payer: Self-pay | Admitting: *Deleted

## 2018-09-03 NOTE — Telephone Encounter (Signed)
Pt has not been seen since feb 2019 by me, BP good at that time on lisinopril 20 qd only  I wonder if she has run out of her med  Shirron to clarify with pt - is she actually taking her med recently or has she run out?  She will need ROV next available as well

## 2018-09-03 NOTE — Telephone Encounter (Signed)
This RN spoke with the patient per her concern due to noted elevations in her BP per readings at visits in this office.  Per prior discussion - pt was asked to check her bp at home at the same time of day.  [name] started checking her b/p daily at 730 am starting 08/29/2018 with readings of  3/18: 186/86   3/20: 195/180  3/21: 196/133 with recheck at noon of 144/82  3/22: 201/101 with recheck within 30 minutes of 172/100.  [name] states she takes per b/p medicine in pm at around 6pm.  She states she b/p medication is prescribed by Dr Cathlean Cower.  She is due for refills of her medication " so I need to see him anyway "  This RN informed pt above readings would be forwarded to Dr Gwynn Burly office for review and possible further recommendations per noted blood pressure readings.

## 2018-09-04 ENCOUNTER — Ambulatory Visit (INDEPENDENT_AMBULATORY_CARE_PROVIDER_SITE_OTHER): Payer: Medicare Other | Admitting: Internal Medicine

## 2018-09-04 ENCOUNTER — Other Ambulatory Visit: Payer: Self-pay

## 2018-09-04 ENCOUNTER — Encounter: Payer: Self-pay | Admitting: Internal Medicine

## 2018-09-04 VITALS — BP 134/88 | HR 72 | Temp 98.6°F | Ht 64.0 in | Wt 243.0 lb

## 2018-09-04 DIAGNOSIS — E119 Type 2 diabetes mellitus without complications: Secondary | ICD-10-CM | POA: Diagnosis not present

## 2018-09-04 DIAGNOSIS — E782 Mixed hyperlipidemia: Secondary | ICD-10-CM | POA: Diagnosis not present

## 2018-09-04 DIAGNOSIS — I10 Essential (primary) hypertension: Secondary | ICD-10-CM

## 2018-09-04 LAB — POCT GLYCOSYLATED HEMOGLOBIN (HGB A1C): Hemoglobin A1C: 6.7 % — AB (ref 4.0–5.6)

## 2018-09-04 MED ORDER — PIOGLITAZONE HCL-METFORMIN HCL 15-500 MG PO TABS: 1.0000 | ORAL_TABLET | Freq: Every day | ORAL | 3 refills | Status: DC

## 2018-09-04 MED ORDER — AMLODIPINE BESYLATE 10 MG PO TABS: 10.0000 mg | ORAL_TABLET | Freq: Every day | ORAL | 3 refills | Status: DC

## 2018-09-04 MED ORDER — LOVASTATIN 20 MG PO TABS: 20.0000 mg | ORAL_TABLET | Freq: Every evening | ORAL | 0 refills | Status: DC

## 2018-09-04 MED ORDER — LOVASTATIN 20 MG PO TABS: 20.0000 mg | ORAL_TABLET | Freq: Every evening | ORAL | 3 refills | Status: DC

## 2018-09-04 MED ORDER — LISINOPRIL 40 MG PO TABS: 40.0000 mg | ORAL_TABLET | Freq: Every day | ORAL | 3 refills | Status: DC

## 2018-09-04 NOTE — Patient Instructions (Signed)
Please take all new medication as prescribed - the lisinopril to 40 mg per day  Please take all new medication as prescribed- the amlodipine 10 mg per day  Your A1c was OK today  Please continue all other medications as before, and refills have been done if requested.  Please have the pharmacy call with any other refills you may need.  Please continue your efforts at being more active, low cholesterol diet, and weight control.  Please keep your appointments with your specialists as you may have planned  Please return in 3 months, or sooner if needed

## 2018-09-04 NOTE — Telephone Encounter (Signed)
Same answer.  If she has medication she should continue.  We would be happy to see her in fhe office, today would be fine..  I declines to adjust BP or cholesterol medication by email. Thanks

## 2018-09-04 NOTE — Telephone Encounter (Signed)
I have followed up with the patient to ask covid-19 screening questions.  Pt denies any fever, chills, SOB, cough, and n/v/d

## 2018-09-04 NOTE — Assessment & Plan Note (Signed)
stable overall by history and exam, recent data reviewed with pt, and pt to continue medical treatment as before,  to f/u any worsening symptoms or concerns, declines f/u lab today 

## 2018-09-04 NOTE — Telephone Encounter (Signed)
Patient scheduled for today

## 2018-09-04 NOTE — Telephone Encounter (Signed)
Spoke with pt. Pt is adamant that her BP has been running high for 2 years or more. Pt states that she has been taking Lisinopril 20mg  as instructed. However she is about to run out of BP med this week and also needs a refill on Lovastatin. Would you do a dosage/med change for the patient to try and she schedules an OV? How would you like to proceed? Please advise.

## 2018-09-04 NOTE — Assessment & Plan Note (Signed)
Mod to severely elevated recently ? De to wt gain, o/w stable overall by history and exam, recent data reviewed with pt, and pt to increase the lisinopril 40 qd, and add amlodipine 10 qd to f/u BP at home and next visit,  to f/u any worsening symptoms or concerns

## 2018-09-04 NOTE — Assessment & Plan Note (Signed)
stable overall by history and exam, recent data reviewed with pt, and pt to continue medical treatment as before,  to f/u any worsening symptoms or concerns  

## 2018-09-04 NOTE — Progress Notes (Signed)
Subjective:    Patient ID: [name], female    DOB: 1946-06-06, 73 y.o.   MRN: [id number]  HPI  Here for yearly f/u;  Overall doing ok;  Pt denies Chest pain, worsening SOB, DOE, wheezing, orthopnea, PND, worsening LE edema, palpitations, dizziness or syncope.  Pt denies neurological change such as new headache, facial or extremity weakness.  Pt denies polydipsia, polyuria, or low sugar symptoms. Pt states overall good compliance with treatment and medications, good tolerability, and has been trying to follow appropriate diet.  Pt denies worsening depressive symptoms, suicidal ideation or panic. No fever, night sweats, wt loss, loss of appetite, or other constitutional symptoms.  Pt states good ability with ADL's, has low fall risk, home safety reviewed and adequate, no other significant changes in hearing or vision, and not active with exercise. Conts to have significant fatigue with sleeping about 3 hrs per day after taking the ibrance.  Now in remission per pt. Has gained wt recently with less activity  BP has been mod to severely elevated in the past months despite normal reading today Wt Readings from Last 3 Encounters:  09/04/18 243 lb (110.2 kg)  08/27/18 241 lb 9.6 oz (109.6 kg)  06/25/18 239 lb 9.6 oz (108.7 kg)   BP Readings from Last 3 Encounters:  09/04/18 134/88  08/27/18 (!) 170/70  06/25/18 (!) 165/89   Past Medical History:  Diagnosis Date  . Breast cancer ([place])   . Cancer ([place]) 02/2016   right breast  . DIABETES MELLITUS, TYPE II 01/04/2007   only takes actoplus daily  . Dizziness and giddiness 02/29/2008  . DVT, HX OF    at age 58 in right buttocks  . Dyspnea    due to lung cancer  . Family history of breast cancer   . GERD 01/04/2007   pt reports resolved   . GLAUCOMA 07/30/2008   both eyes  . History of blood transfusion    no abnormal  reaction  . History of uterine cancer 2000   hysterectomy done  . HYPERLIPIDEMIA 01/04/2007   taking Pravastatin daily  .  HYPERTENSION 01/04/2007   takes Lisinopril daily  . Joint pain   . Joint swelling   . Leg cramps   . LEG PAIN, LEFT 07/06/2007  . NUMBNESS 07/30/2008   in fingers;pt states from [facility]  . OSTEOARTHRITIS, HIP 09/25/2009  . OTITIS MEDIA, ACUTE, BILATERAL 02/29/2008  . Overweight(278.02) 01/04/2007  . Peripheral vascular disease ([place])   . Personal history of radiation therapy 2018  . Pneumonia   . PONV (postoperative nausea and vomiting)   . SLEEP APNEA, OBSTRUCTIVE    doesn't use a cpap;study done about 15yrs ago  . TRANSIENT ISCHEMIC ATTACK, HX OF 01/04/2007  . Vision loss    left eye   Past Surgical History:  Procedure Laterality Date  . ABDOMINAL HYSTERECTOMY  2000  . BREAST LUMPECTOMY Right 01/13/2017   x2  . BREAST LUMPECTOMY WITH RADIOACTIVE SEED AND SENTINEL LYMPH NODE BIOPSY Right 01/13/2017   Procedure: RIGHT BREAST RADIOACTIVE SEED X'S 2 GUIDED LUMPECTOMY WITH RADIOACTIVE SEED TARGETED AXILLARYLYMPH NODE EXCISION AND RIGHT AXILLARY SENTINEL LYMPH NODE BIOPSY;  Surgeon: Alphonsa Overall, MD;  Location: [facility];  Service: General;  Laterality: Right;  2 SEEDS IN RIGHT BREAST 1 SEED IN RIGHT AXILLARY NODE  . CHOLECYSTECTOMY    . ENDOBRONCHIAL ULTRASOUND Bilateral 03/28/2016   Procedure: ENDOBRONCHIAL ULTRASOUND;  Surgeon: Collene Gobble, MD;  Location: [facility];  Service: Cardiopulmonary;  Laterality:  Bilateral;  . EYE SURGERY  13   shunt left and lazer eye surgery on right cataract and retenia tear with repair  . growth removal  2004   from thumb  . KNEE ARTHROSCOPY Right   . mulitple eye surgeries     both eyes, cataracts with ioc done both eyes  . OOPHORECTOMY    . right lumpectomy with axillary node dissection Right 01/2017  . TOTAL HIP ARTHROPLASTY  06/24/2011   Procedure: TOTAL HIP ARTHROPLASTY;  Surgeon: Kerin Salen;  Location: [facility];  Service: Orthopedics;  Laterality: Right;  . TOTAL HIP ARTHROPLASTY Left 11/12/2012   Dr Mayer Camel  . TOTAL HIP ARTHROPLASTY Left 11/12/2012    Procedure: TOTAL HIP ARTHROPLASTY;  Surgeon: Kerin Salen, MD;  Location: [facility];  Service: Orthopedics;  Laterality: Left;  DEPUY PINNACLE    reports that she has never smoked. She has never used smokeless tobacco. She reports that she does not drink alcohol or use drugs. family history includes Breast cancer (age of onset: 16) in her sister; Cancer in her mother; Dementia in her mother; Glaucoma in her maternal grandfather; Heart attack in her maternal aunt; Lung cancer in her maternal grandmother; Stroke in her sister. Allergies  Allergen Reactions  . Codeine Hives    Hycodan syrup  . Fluorescein Nausea And Vomiting    ? IV dye for retina specialist  . Lipitor [Atorvastatin Calcium]     Leg cramp  . Oxycodone Nausea And Vomiting    Patient vomited for 3 days after taking  . Sitagliptin Phosphate Nausea And Vomiting  . Sulfa Drugs Cross Reactors Nausea And Vomiting   Current Outpatient Medications on File Prior to Visit  Medication Sig Dispense Refill  . acetaminophen (TYLENOL) 500 MG tablet Take 1,000 mg by mouth every 4 (four) hours as needed for moderate pain or fever.    Marland Kitchen aspirin EC 81 MG tablet Take 81 mg by mouth daily at 6 PM. 1700    . bimatoprost (LUMIGAN) 0.01 % SOLN Place 1 drop into both eyes at bedtime.    . brimonidine-timolol (COMBIGAN) 0.2-0.5 % ophthalmic solution Place 1 drop into both eyes three times daily    . cholecalciferol (VITAMIN D) 1000 units tablet Take 1,000 Units by mouth daily.    . dorzolamide-timolol (COSOPT) 22.3-6.8 MG/ML ophthalmic solution Place 1 drop into both eyes 2 (two) times daily.    [name] 75 MG capsule TAKE 1 CAPSULE (75 MG TOTAL) BY MOUTH DAILY WITH BREAKFAST. TAKE WHOLE WITH FOOD. 21 capsule 6  . letrozole (FEMARA) 2.5 MG tablet Take 1 tablet (2.5 mg total) by mouth at bedtime. 90 tablet 3   No current facility-administered medications on file prior to visit.    Review of Systems  Constitutional: Negative for other unusual  diaphoresis or sweats HENT: Negative for ear discharge or swelling Eyes: Negative for other worsening visual disturbances Respiratory: Negative for stridor or other swelling  Gastrointestinal: Negative for worsening distension or other blood Genitourinary: Negative for retention or other urinary change Musculoskeletal: Negative for other MSK pain or swelling Skin: Negative for color change or other new lesions Neurological: Negative for worsening tremors and other numbness  Psychiatric/Behavioral: Negative for worsening agitation or other fatigue ALl other system neg per pt    Objective:   Physical Exam BP 134/88   Pulse 72   Temp 98.6 F (37 C) (Oral)   Ht 5\' 4"  (1.626 m)   Wt 243 lb (110.2 kg)   SpO2  98%   BMI 41.71 kg/m  VS noted,  Constitutional: Pt appears in NAD HENT: Head: NCAT.  Right Ear: External ear normal.  Left Ear: External ear normal.  Eyes: . Pupils are equal, round, and reactive to light. Conjunctivae and EOM are normal Nose: without d/c or deformity Neck: Neck supple. Gross normal ROM Cardiovascular: Normal rate and regular rhythm.   Pulmonary/Chest: Effort normal and breath sounds without rales or wheezing.  Abd:  Soft, NT, ND, + BS, no organomegaly Neurological: Pt is alert. At baseline orientation, motor grossly intact Skin: Skin is warm. No rashes, other new lesions, no LE edema Psychiatric: Pt behavior is normal without agitation  No other exam findings  Lab Results  Component Value Date   WBC 2.5 (L) 08/27/2018   HGB 12.5 08/27/2018   HCT 36.8 08/27/2018   PLT 132 (L) 08/27/2018   GLUCOSE 245 (H) 07/25/2018   CHOL 195 04/29/2015   TRIG 129.0 04/29/2015   HDL 52.40 04/29/2015   LDLCALC 117 (H) 04/29/2015   ALT 13 07/25/2018   AST 11 (L) 07/25/2018   NA 141 07/25/2018   K 4.3 07/25/2018   CL 108 07/25/2018   CREATININE 0.87 07/25/2018   BUN 14 07/25/2018   CO2 26 07/25/2018   TSH 0.746 04/02/2018   INR 1.06 11/08/2012   HGBA1C 6.7  07/20/2017   MICROALBUR <0.7 10/30/2014   Contains abnormal data POCT glycosylated hemoglobin (Hb A1C)  Order: [id number]  Status:  Final result Visible to patient:  No (Not Released) Dx:  Type 2 diabetes mellitus without comp...   Ref Range & Units 15:58 (09/04/18) 12yr ago (07/20/17) 50yr ago (01/04/17) 82yr ago (04/29/15) 31yr ago (10/30/14) 46yr ago (09/27/13) 38yr ago (05/28/12)  Hemoglobin A1C 4.0 - 5.6 % 6.7Abnormal   6.7 R 6.7High  R, CM 7.3High  R, CM 6.9High  R, CM 6.5 R, CM 8.5High            Assessment & Plan:

## 2018-09-06 ENCOUNTER — Other Ambulatory Visit: Payer: Self-pay

## 2018-09-06 DIAGNOSIS — Z170 Estrogen receptor positive status [ER+]: Secondary | ICD-10-CM

## 2018-09-06 DIAGNOSIS — C50411 Malignant neoplasm of upper-outer quadrant of right female breast: Secondary | ICD-10-CM

## 2018-09-06 MED ORDER — LETROZOLE 2.5 MG PO TABS: 2.5000 mg | ORAL_TABLET | Freq: Every day | ORAL | 0 refills | Status: DC

## 2018-09-17 ENCOUNTER — Other Ambulatory Visit: Payer: Medicare Other

## 2018-09-24 ENCOUNTER — Inpatient Hospital Stay: Payer: Medicare Other | Attending: Oncology

## 2018-09-24 ENCOUNTER — Other Ambulatory Visit: Payer: Self-pay

## 2018-09-24 DIAGNOSIS — C7801 Secondary malignant neoplasm of right lung: Secondary | ICD-10-CM

## 2018-09-24 DIAGNOSIS — Z8542 Personal history of malignant neoplasm of other parts of uterus: Secondary | ICD-10-CM | POA: Insufficient documentation

## 2018-09-24 DIAGNOSIS — K760 Fatty (change of) liver, not elsewhere classified: Secondary | ICD-10-CM | POA: Insufficient documentation

## 2018-09-24 DIAGNOSIS — Z170 Estrogen receptor positive status [ER+]: Secondary | ICD-10-CM | POA: Diagnosis not present

## 2018-09-24 DIAGNOSIS — L659 Nonscarring hair loss, unspecified: Secondary | ICD-10-CM

## 2018-09-24 DIAGNOSIS — C50411 Malignant neoplasm of upper-outer quadrant of right female breast: Secondary | ICD-10-CM | POA: Diagnosis not present

## 2018-09-24 LAB — COMPREHENSIVE METABOLIC PANEL
ALT: 13 U/L (ref 0–44)
AST: 15 U/L (ref 15–41)
Albumin: 3.9 g/dL (ref 3.5–5.0)
Alkaline Phosphatase: 72 U/L (ref 38–126)
Anion gap: 10 (ref 5–15)
BUN: 12 mg/dL (ref 8–23)
CO2: 23 mmol/L (ref 22–32)
Calcium: 8.7 mg/dL — ABNORMAL LOW (ref 8.9–10.3)
Chloride: 107 mmol/L (ref 98–111)
Creatinine, Ser: 0.85 mg/dL (ref 0.44–1.00)
GFR calc Af Amer: >60 mL/min (ref >60)
GFR calc non Af Amer: >60 mL/min (ref >60)
Glucose, Bld: 190 mg/dL — ABNORMAL HIGH (ref 70–99)
Potassium: 4.1 mmol/L (ref 3.5–5.1)
Sodium: 140 mmol/L (ref 135–145)
Total Bilirubin: 0.5 mg/dL (ref 0.3–1.2)
Total Protein: 7.3 g/dL (ref 6.5–8.1)

## 2018-09-24 LAB — TSH: TSH: 1.172 u[IU]/mL (ref 0.308–3.960)

## 2018-09-24 LAB — CBC WITH DIFFERENTIAL/PLATELET
Abs Immature Granulocytes: 0.01 10*3/uL (ref 0.00–0.07)
Basophils Absolute: 0 10*3/uL (ref 0.0–0.1)
Basophils Relative: 1 %
Eosinophils Absolute: 0 10*3/uL (ref 0.0–0.5)
Eosinophils Relative: 1 %
HCT: 37 % (ref 36.0–46.0)
Hemoglobin: 12 g/dL (ref 12.0–15.0)
Immature Granulocytes: 1 %
Lymphocytes Relative: 39 %
Lymphs Abs: 0.7 10*3/uL (ref 0.7–4.0)
MCH: 32.2 pg (ref 26.0–34.0)
MCHC: 32.4 g/dL (ref 30.0–36.0)
MCV: 99.2 fL (ref 80.0–100.0)
Monocytes Absolute: 0.2 10*3/uL (ref 0.1–1.0)
Monocytes Relative: 9 %
Neutro Abs: 0.9 10*3/uL — ABNORMAL LOW (ref 1.7–7.7)
Neutrophils Relative %: 49 %
Platelets: 152 10*3/uL (ref 150–400)
RBC: 3.73 MIL/uL — ABNORMAL LOW (ref 3.87–5.11)
RDW: 15.2 % (ref 11.5–15.5)
WBC: 1.7 10*3/uL — ABNORMAL LOW (ref 4.0–10.5)
nRBC: 0 % (ref 0.0–0.2)

## 2018-09-24 MED FILL — IBRANCE 75 MG CAPSULE: 75 | 28 days supply | Qty: 21 | Fill #6

## 2018-09-25 LAB — CANCER ANTIGEN 27.29: CA 27.29: 35.7 U/mL (ref 0.0–38.6)

## 2018-10-15 ENCOUNTER — Other Ambulatory Visit: Payer: Medicare Other

## 2018-10-15 ENCOUNTER — Other Ambulatory Visit: Payer: Self-pay

## 2018-10-15 ENCOUNTER — Ambulatory Visit
Admission: RE | Admit: 2018-10-15 | Discharge: 2018-10-15 | Disposition: A | Discharge status: Home / Self Care | Payer: Medicare Other | Source: Ambulatory Visit | Attending: Oncology | Admitting: Oncology

## 2018-10-15 ENCOUNTER — Ambulatory Visit: Payer: Medicare Other | Admitting: Adult Health

## 2018-10-15 DIAGNOSIS — C7800 Secondary malignant neoplasm of unspecified lung: Secondary | ICD-10-CM

## 2018-10-15 DIAGNOSIS — C50411 Malignant neoplasm of upper-outer quadrant of right female breast: Secondary | ICD-10-CM | POA: Diagnosis not present

## 2018-10-15 DIAGNOSIS — I10 Essential (primary) hypertension: Secondary | ICD-10-CM

## 2018-10-15 DIAGNOSIS — E782 Mixed hyperlipidemia: Secondary | ICD-10-CM

## 2018-10-15 DIAGNOSIS — Z170 Estrogen receptor positive status [ER+]: Secondary | ICD-10-CM

## 2018-10-15 DIAGNOSIS — I700 Atherosclerosis of aorta: Secondary | ICD-10-CM

## 2018-10-17 ENCOUNTER — Other Ambulatory Visit: Payer: Self-pay | Admitting: Oncology

## 2018-10-17 DIAGNOSIS — C7800 Secondary malignant neoplasm of unspecified lung: Secondary | ICD-10-CM

## 2018-10-17 DIAGNOSIS — C50411 Malignant neoplasm of upper-outer quadrant of right female breast: Secondary | ICD-10-CM

## 2018-10-17 DIAGNOSIS — Z170 Estrogen receptor positive status [ER+]: Secondary | ICD-10-CM

## 2018-10-18 ENCOUNTER — Telehealth: Payer: Self-pay | Admitting: Oncology

## 2018-10-18 MED FILL — IBRANCE 75 MG CAPSULE: 75 | 28 days supply | Qty: 21 | Fill #0

## 2018-10-18 NOTE — Telephone Encounter (Signed)
Called regarding upcoming [facility] appointment, screen test complete and patient is notified.

## 2018-10-18 NOTE — Progress Notes (Signed)
[facility]  [phone] [phone]     ID: [name] DOB: 03/03/1946  MR#: [id number]  NGE#:[id number]  Patient Care Team: Biagio Borg, MD as PCP - Stevan Born, MD as Consulting Physician (General Surgery) Magrinat, Virgie Dad, MD as Consulting Physician (Oncology) Kyung Rudd, MD as Consulting Physician (Radiation Oncology) Bobbye Charleston, MD as Consulting Physician (Obstetrics and Gynecology) Nada Libman, MD as Referring Physician (Specialist) Vevelyn Royals, MD as Consulting Physician (Ophthalmology) Lamonte Sakai Rose Fillers, MD as Consulting Physician (Pulmonary Disease) Alexis Frock, MD as Consulting Physician (Urology) OTHER MD:  I connected with Vanessa Kick on 10/22/18 at 11:30 AM EDT by video enabled telemedicine visit and verified that I am speaking with the correct person using two identifiers.   I discussed the limitations, risks, security and privacy concerns of performing an evaluation and management service by telemedicine and the availability of in-person appointments. I also discussed with the patient that there may be a patient responsible charge related to this service. The patient expressed understanding and agreed to proceed.   Other persons participating in the visit and their role in the encounter: Wilburn Mylar, scribe   Patient's location: home  Provider's location: [place]    CHIEF COMPLAINT: Estrogen receptor positive breast cancer  CURRENT TREATMENT: Letrozole, palbociclib   INTERVAL HISTORY: [name] is seen today for follow-up and treatment of her estrogen receptor positive stage IV breast cancer.  The patient continues on letrozole. She tolerates this well and without any noticeable side effects. She denies hot flashes. She reports vaginal dryness but notes this is not a new problem.  The patient also continues on palbociclib. She will start her next cycle tomorrow. She  reports fatigue that requires a nap in the mornings. She denies nausea but reports occasional feeling of food getting caught in her throat.  Since her last visit, she underwent diagnostic bilateral mammogram on 10/15/2018, with results showing: breast density category C; no mammographic evidence of malignancy in the bilateral breasts.  An order for a bone scan is in place but has not been scheduled.   REVIEW OF SYSTEMS: [name] reports doing well overall. She states her hair is thinning, but she is not sure why. She reports she is keeping to a diet that controls her blood sugar. She states she saw her PCP and was given new blood pressure medications. She reports feeling better with these. The patient denies unusual headaches, visual changes, nausea, vomiting, stiff neck, dizziness, or gait imbalance. There has been no cough, phlegm production, or pleurisy, no chest pain or pressure, and no change in bowel or bladder habits. The patient denies fever, rash, bleeding, unexplained fatigue or unexplained weight loss. A detailed review of systems was otherwise entirely negative.   BREAST CANCER HISTORY: From the original intake note:  [name] had screening mammography showing some suspicious calcifications in the right breast leading to right diagnostic mammography with ultrasonography 01/22/2016 at [facility]. The breast density was category C. In the upper right breast there was a 2.3 cm mass with additional masses measuring 0.9 and 0.7 cm. There was also a possible additional 0.8 mass in the lower inner quadrant. Ultrasound confirmed an irregular hypoechoic mass in the right breast upper outer quadrant measuring 2.0 cm. There were other masses measuring 0.7 and 0.8 cm by ultrasonography. The right axilla was sonographically benign.  Biopsy of a 12:00 and 4:00 mass in the right breast 01/22/2016 showed ([id number]) both specimens showing invasive  ductal carcinoma, grade 1 or 2, both 95% estrogen receptor  positive, both 95% progesterone receptor positive, both with strong staining intensity, with MIB-1 ranging from 10-15%, and both HER-2 negative, the signals ratio being 1.23-1.42, and the number per cell 1.85-2.59.  Her subsequent history is as detailed below    PAST MEDICAL HISTORY: Past Medical History:  Diagnosis Date  . Breast cancer ([place])   . Cancer ([place]) 02/2016   right breast  . DIABETES MELLITUS, TYPE II 01/04/2007   only takes actoplus daily  . Dizziness and giddiness 02/29/2008  . DVT, HX OF    at age 24 in right buttocks  . Dyspnea    due to lung cancer  . Family history of breast cancer   . GERD 01/04/2007   pt reports resolved   . GLAUCOMA 07/30/2008   both eyes  . History of blood transfusion    no abnormal  reaction  . History of uterine cancer 2000   hysterectomy done  . HYPERLIPIDEMIA 01/04/2007   taking Pravastatin daily  . HYPERTENSION 01/04/2007   takes Lisinopril daily  . Joint pain   . Joint swelling   . Leg cramps   . LEG PAIN, LEFT 07/06/2007  . NUMBNESS 07/30/2008   in fingers;pt states from Diamox  . OSTEOARTHRITIS, HIP 09/25/2009  . OTITIS MEDIA, ACUTE, BILATERAL 02/29/2008  . Overweight(278.02) 01/04/2007  . Peripheral vascular disease ([place])   . Personal history of radiation therapy 2018  . Pneumonia   . PONV (postoperative nausea and vomiting)   . SLEEP APNEA, OBSTRUCTIVE    doesn't use a cpap;study done about 76yr ago  . TRANSIENT ISCHEMIC ATTACK, HX OF 01/04/2007  . Vision loss    left eye    PAST SURGICAL HISTORY: Past Surgical History:  Procedure Laterality Date  . ABDOMINAL HYSTERECTOMY  2000  . BREAST LUMPECTOMY Right 01/13/2017   x2  . BREAST LUMPECTOMY WITH RADIOACTIVE SEED AND SENTINEL LYMPH NODE BIOPSY Right 01/13/2017   Procedure: RIGHT BREAST RADIOACTIVE SEED X'S 2 GUIDED LUMPECTOMY WITH RADIOACTIVE SEED TARGETED AXILLARYLYMPH NODE EXCISION AND RIGHT AXILLARY SENTINEL LYMPH NODE BIOPSY;  Surgeon: NAlphonsa Overall MD;  [facility]  Service: General;  Laterality: Right;  2 SEEDS IN RIGHT BREAST 1 SEED IN RIGHT AXILLARY NODE  . CHOLECYSTECTOMY    . ENDOBRONCHIAL ULTRASOUND Bilateral 03/28/2016   Procedure: ENDOBRONCHIAL ULTRASOUND;  Surgeon: RCollene Gobble MD;  Location: [facility];  Service: Cardiopulmonary;  Laterality: Bilateral;  . EYE SURGERY  13   shunt left and lazer eye surgery on right cataract and retenia tear with repair  . growth removal  2004   from thumb  . KNEE ARTHROSCOPY Right   . mulitple eye surgeries     both eyes, cataracts with ioc done both eyes  . OOPHORECTOMY    . right lumpectomy with axillary node dissection Right 01/2017  . TOTAL HIP ARTHROPLASTY  06/24/2011   Procedure: TOTAL HIP ARTHROPLASTY;  Surgeon: FKerin Salen  [facility]  Service: Orthopedics;  Laterality: Right;  . TOTAL HIP ARTHROPLASTY Left 11/12/2012   Dr RMayer Camel . TOTAL HIP ARTHROPLASTY Left 11/12/2012   Procedure: TOTAL HIP ARTHROPLASTY;  Surgeon: FKerin Salen MD;  [facility]  Service: Orthopedics;  Laterality: Left;  DEPUY PINNACLE    FAMILY HISTORY Family History  Problem Relation Age of Onset  . Dementia Mother   . Cancer Mother        Breast and lung cancer  . Stroke  Sister   . Breast cancer Sister 38  . Heart attack Maternal Aunt   . Lung cancer Maternal Grandmother        non smoker  . Glaucoma Maternal Grandfather   . Anesthesia problems Neg Hx   The patient's father died at age 71, the patient's mother died at age 60. She had breast and lung cancers diagnosed shortly before her death. The patient had no brothers, 2 sisters. One sister was diagnosed with breast cancer at the age of 71.   GYNECOLOGIC HISTORY:  No LMP recorded. Patient has had a hysterectomy. Menarche age 64, first live birth age 29, the patient is GX P1. She had a hysterectomy for endometrial cancer in the year 2000. She did not take hormone replacement. She did use oral contraceptives for more than 20 years remotely, with  no complications.   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 She is home with [name]. He is a retired Dealer.Their son [name] also lives in [place].    ADVANCED DIRECTIVES: In place  HEALTH MAINTENANCE: Social History   Tobacco Use  . Smoking status: Never Smoker  . Smokeless tobacco: Never Used  Substance Use Topics  . Alcohol use: No  . Drug use: No     Colonoscopy: Never  PAP: Status post hysterectomy  Bone density: Remote   Allergies  Allergen Reactions  . Codeine Hives    Hycodan syrup  . Fluorescein Nausea And Vomiting    ? IV dye for retina specialist  . Lipitor [Atorvastatin Calcium]     Leg cramp  . Oxycodone Nausea And Vomiting    Patient vomited for 3 days after taking  . Sitagliptin Phosphate Nausea And Vomiting  . Sulfa Drugs Cross Reactors Nausea And Vomiting    Current Outpatient Medications  Medication Sig Dispense Refill  . acetaminophen (TYLENOL) 500 MG tablet Take 1,000 mg by mouth every 4 (four) hours as needed for moderate pain or fever.    Marland Kitchen amLODipine (NORVASC) 10 MG tablet Take 1 tablet (10 mg total) by mouth daily. 90 tablet 3  . aspirin EC 81 MG tablet Take 81 mg by mouth daily at 6 PM. 1700    . bimatoprost (LUMIGAN) 0.01 % SOLN Place 1 drop into both eyes at bedtime.    . brimonidine-timolol (COMBIGAN) 0.2-0.5 % ophthalmic solution Place 1 drop into both eyes three times daily    . cholecalciferol (VITAMIN D) 1000 units tablet Take 1,000 Units by mouth daily.    . dorzolamide-timolol (COSOPT) 22.3-6.8 MG/ML ophthalmic solution Place 1 drop into both eyes 2 (two) times daily.    [name] 75 MG capsule TAKE 1 CAPSULE (75 MG TOTAL) BY MOUTH DAILY WITH BREAKFAST. TAKE WHOLE WITH FOOD. 21 capsule 6  . letrozole (FEMARA) 2.5 MG tablet Take 1 tablet (2.5 mg total) by mouth at bedtime. 90 tablet 0  . lisinopril (PRINIVIL,ZESTRIL) 40 MG tablet  Take 1 tablet (40 mg total) by mouth daily. 90 tablet 3  . lovastatin (MEVACOR) 20 MG tablet Take 1 tablet (20 mg total) by mouth every evening. 90 tablet 3  . pioglitazone-metformin (ACTOPLUS MET) 15-500 MG tablet Take 1 tablet by mouth daily. 90 tablet 3   No current facility-administered medications for this visit.      OBJECTIVE: Older white woman in no acute distress  There were no vitals filed for this visit.   There  is no height or weight on file to calculate BMI.    ECOG FS:1 - Symptomatic but completely ambulatory There were no vitals filed for this visit.   LAB RESULTS:  CMP     Component Value Date/Time   NA 140 09/24/2018 1058   NA 140 05/29/2017 1254   K 4.1 09/24/2018 1058   K 4.4 05/29/2017 1254   CL 107 09/24/2018 1058   CO2 23 09/24/2018 1058   CO2 25 05/29/2017 1254   GLUCOSE 190 (H) 09/24/2018 1058   GLUCOSE 154 (H) 05/29/2017 1254   BUN 12 09/24/2018 1058   BUN 11.5 05/29/2017 1254   CREATININE 0.85 09/24/2018 1058   CREATININE 0.80 04/02/2018 1116   CREATININE 0.8 05/29/2017 1254   CALCIUM 8.7 (L) 09/24/2018 1058   CALCIUM 9.3 05/29/2017 1254   PROT 7.3 09/24/2018 1058   PROT 7.0 05/29/2017 1254   ALBUMIN 3.9 09/24/2018 1058   ALBUMIN 4.1 05/29/2017 1254   AST 15 09/24/2018 1058   AST 13 (L) 04/02/2018 1116   AST 13 05/29/2017 1254   ALT 13 09/24/2018 1058   ALT 15 04/02/2018 1116   ALT 14 05/29/2017 1254   ALKPHOS 72 09/24/2018 1058   ALKPHOS 66 05/29/2017 1254   BILITOT 0.5 09/24/2018 1058   BILITOT 0.4 04/02/2018 1116   BILITOT 0.50 05/29/2017 1254   GFRNONAA >60 09/24/2018 1058   GFRNONAA >60 04/02/2018 1116   GFRAA >60 09/24/2018 1058   GFRAA >60 04/02/2018 1116    INo results found for: SPEP, UPEP  Lab Results  Component Value Date   WBC 1.7 (L) 09/24/2018   NEUTROABS 0.9 (L) 09/24/2018   HGB 12.0 09/24/2018   HCT 37.0 09/24/2018   MCV 99.2 09/24/2018   PLT 152 09/24/2018      Chemistry      Component Value Date/Time    NA 140 09/24/2018 1058   NA 140 05/29/2017 1254   K 4.1 09/24/2018 1058   K 4.4 05/29/2017 1254   CL 107 09/24/2018 1058   CO2 23 09/24/2018 1058   CO2 25 05/29/2017 1254   BUN 12 09/24/2018 1058   BUN 11.5 05/29/2017 1254   CREATININE 0.85 09/24/2018 1058   CREATININE 0.80 04/02/2018 1116   CREATININE 0.8 05/29/2017 1254      Component Value Date/Time   CALCIUM 8.7 (L) 09/24/2018 1058   CALCIUM 9.3 05/29/2017 1254   ALKPHOS 72 09/24/2018 1058   ALKPHOS 66 05/29/2017 1254   AST 15 09/24/2018 1058   AST 13 (L) 04/02/2018 1116   AST 13 05/29/2017 1254   ALT 13 09/24/2018 1058   ALT 15 04/02/2018 1116   ALT 14 05/29/2017 1254   BILITOT 0.5 09/24/2018 1058   BILITOT 0.4 04/02/2018 1116   BILITOT 0.50 05/29/2017 1254       No results found for: LABCA2  No components found for: [id number]  No results for input(s): INR in the last 168 hours.  Urinalysis    Component Value Date/Time   COLORURINE YELLOW 08/18/2017 1322   APPEARANCEUR HAZY (A) 08/18/2017 1322   LABSPEC 1.010 08/18/2017 1322   PHURINE 7.0 08/18/2017 1322   GLUCOSEU NEGATIVE 08/18/2017 1322   GLUCOSEU NEGATIVE 10/30/2014 1513   HGBUR LARGE (A) 08/18/2017 1322   BILIRUBINUR NEGATIVE 08/18/2017 1322   KETONESUR NEGATIVE 08/18/2017 1322   PROTEINUR 30 (A) 08/18/2017 1322   UROBILINOGEN 0.2 10/30/2014 1513   NITRITE NEGATIVE 08/18/2017 1322   LEUKOCYTESUR LARGE (A) 08/18/2017 1322    STUDIES:  Mm Diag Breast Tomo Bilateral  Result Date: 10/15/2018 CLINICAL DATA:  73 year old female presenting for routine annual surveillance status post right breast lumpectomy x2 in 2018. EXAM: DIGITAL DIAGNOSTIC BILATERAL MAMMOGRAM WITH CAD AND TOMO COMPARISON:  Previous exam(s). ACR Breast Density Category c: The breast tissue is heterogeneously dense, which may obscure small masses. FINDINGS: The lumpectomy sites in the right breast are stable. No suspicious calcifications, masses or areas of distortion are seen in the  bilateral breasts. Mammographic images were processed with CAD. IMPRESSION: Stable right breast lumpectomy sites. No mammographic evidence of malignancy in the bilateral breasts. RECOMMENDATION: Diagnostic mammogram is suggested in 1 year. (Code:[id number]-01Y) I have discussed the findings and recommendations with the patient. Results were also provided in writing at the conclusion of the visit. If applicable, a reminder letter will be sent to the patient regarding the next appointment. BI-RADS CATEGORY  2: Benign. Electronically Signed   By: Ammie Ferrier M.D.   On: 10/15/2018 10:37    ELIGIBLE FOR AVAILABLE RESEARCH PROTOCOL: no  ASSESSMENT: 73 y.o. [place] woman with a remote history of early stage endometrial cancer, now status post right breast upper outer quadrant biopsy 01/22/2016 for a clinically multifocal T2 N0, stage 2A invasive ductal carcinoma, grade 1, estrogen and progesterone receptor positive, HER-2 negative, with an MIB-1 between 10 and 15%.  (1) right axillary lymph node biopsy 03/02/2016 positive  (2) genetics testing 01/13/2016 through the Custom gene panel offered by [facility] found no deleterious mutations in  ATM, BARD1, BRCA1, BRCA2, BRIP1, CDH1, CHEK2, EPCAM, FANCC, MLH1, MSH2, MSH6, MUTYH, NBN, PALB2, PMS2, POLD1, PTEN, RAD51C, RAD51D, TP53, and XRCC2  METASTATIC DISEASE: OCT 2017 (3) CT scans of the chest abdomen and pelvis obtained 03/10/2016 are consistent with bilateral lung metastases and mediastinal and hilar nodal involvement, but no liver or bone spread  (a) bronchoscopic lymph node biopsy 2 (station 7, 13R) 03/28/2016 confirms metastatic adenocarcinoma, estrogen receptor positive, HER-2 not amplified  (b) baseline CA-27-29 on 04/18/2016 was 137.5.  (4) letrozole started 03/15/2016, palbociclib added 03/29/2016 at 125 mg/day, 21/7  (a) dose decreased to 100 mg per day, 21/7, beginning with February cycle  (b) palbociclib held 01/31/2017, with increasing  symptoms  (c) palbociclib resumed October 2018 at 75 mg daily  (d) palbociclib dose reduced to 75 mg every other day February through April 2019  (e) palbociclib dose resumed at 75 mg daily as of 10/17/2017  (5) status post double right lumpectomies and right axillary lymph node sampling 01/13/2017 for 2 separate invasive ductal carcinoma lesions, pT1a and pT1b, N1a, with negative margins, both lesions being estrogen and progesterone receptor positive and HER-2 negative  (6) adjuvant radiation completed 07/05/2017 1. 50.4 Gy in 28 fractions to the right breast and supraclavicular region using whole-breast tangent fields. 2. Boost to the seroma delivered an additional 10 Gy in 5 fractions. The total dose was 60.4 Gy.  (7) restaging studies:  (a) CT scan of the chest and bone scan 06/23/2017 showed stable scattered very small lung nodules, no bone lesions  (b) CT of the chest 10/17/2017 showed no new or progressive metastatic disease in the chest. The small left lower lobe pulmonary nodule is stable  (c) PET scan on 03/02/2018: shows no findings for residual or recurrent right breast cancer   (8) right upper pole renal lesion noted to be enlarging on CT scan 06/22/2017  (a) no uptake on PET scan obtained 03/02/2018   PLAN: [name] is now 2-1/2 years out from definitive diagnosis of  metastatic breast cancer, with no evidence of disease activity.  This is very favorable.  She is tolerating the letrozole and palbociclib remarkably well and the plan is to continue those until there is definite evidence of disease progression.  Because of the current pandemic we are not going to expose her to visits here just to get lab work.  Instead I am going to schedule her to see me early July.  Hopefully by then things will have quieted down somewhat and it will be safer for her to make doctor visits.  In the meantime I commended her pandemic prevention plan and encouraged her to continue to stay as active as  possible.  She knows to call us for any other issue that may develop before her next visit.   Virgie Dad. Magrinat, MD  10/22/18 11:50 AM Medical Oncology and Hematology [facility] [address] [place], [place] [address] Tel. [phone]    Fax. [phone]    I, Wilburn Mylar, am acting as scribe for Dr. Virgie Dad. Magrinat.  I, Lurline Del MD, have reviewed the above documentation for accuracy and completeness, and I agree with the above.

## 2018-10-22 ENCOUNTER — Inpatient Hospital Stay: Payer: Medicare Other | Attending: Oncology | Admitting: Oncology

## 2018-10-22 ENCOUNTER — Other Ambulatory Visit: Payer: Medicare Other

## 2018-10-22 DIAGNOSIS — C50411 Malignant neoplasm of upper-outer quadrant of right female breast: Secondary | ICD-10-CM

## 2018-10-22 DIAGNOSIS — C7800 Secondary malignant neoplasm of unspecified lung: Secondary | ICD-10-CM | POA: Diagnosis not present

## 2018-10-22 DIAGNOSIS — Z170 Estrogen receptor positive status [ER+]: Secondary | ICD-10-CM | POA: Diagnosis not present

## 2018-10-23 ENCOUNTER — Telehealth: Payer: Self-pay | Admitting: Oncology

## 2018-10-23 NOTE — Telephone Encounter (Signed)
Called regarding schedule °

## 2018-11-14 MED FILL — IBRANCE 75 MG CAPSULE: 75 | 28 days supply | Qty: 21 | Fill #1

## 2018-11-21 DIAGNOSIS — H401134 Primary open-angle glaucoma, bilateral, indeterminate stage: Secondary | ICD-10-CM | POA: Diagnosis not present

## 2018-11-21 DIAGNOSIS — E119 Type 2 diabetes mellitus without complications: Secondary | ICD-10-CM | POA: Diagnosis not present

## 2018-11-21 DIAGNOSIS — Z961 Presence of intraocular lens: Secondary | ICD-10-CM | POA: Diagnosis not present

## 2018-11-29 ENCOUNTER — Telehealth: Payer: Self-pay | Admitting: Internal Medicine

## 2018-11-29 ENCOUNTER — Other Ambulatory Visit: Payer: Self-pay

## 2018-11-29 ENCOUNTER — Telehealth: Payer: Self-pay | Admitting: *Deleted

## 2018-11-29 DIAGNOSIS — Z20822 Contact with and (suspected) exposure to covid-19: Secondary | ICD-10-CM

## 2018-11-29 DIAGNOSIS — R6889 Other general symptoms and signs: Secondary | ICD-10-CM | POA: Diagnosis not present

## 2018-11-29 NOTE — Addendum Note (Signed)
Addended by: Torrie Mayers on: 11/29/2018 11:28 AM   Modules accepted: Orders

## 2018-11-29 NOTE — Telephone Encounter (Signed)
Patient called to have her test rescheduled to 1430 due to her husband is waiting for a referral from Dr. Jenny Reichmann for him to be tested.

## 2018-11-29 NOTE — Telephone Encounter (Signed)
Please schedule pt for covid19 testing due to possible exposure. Her contact number is [phone].

## 2018-11-29 NOTE — Telephone Encounter (Signed)
Pt reports ate (dined in) at restaurant that was later that week closed for positive exposure of employee. Pt is asymptomatic. She is requesting covid testing for both she and her husband. Husband [name]) is asymptomatic as well. Pt is able to do virtual appt if needed "Later in day;" prefers telephone call.  Pt email and ph # verified. CB# [phone]

## 2018-11-29 NOTE — Telephone Encounter (Signed)
Patient and husband scheduled for covid testing today @ [facility]. Instructions given and order placed.

## 2018-12-01 LAB — NOVEL CORONAVIRUS, NAA: SARS-CoV-2, NAA: NOT DETECTED

## 2018-12-03 ENCOUNTER — Other Ambulatory Visit: Payer: Self-pay | Admitting: Oncology

## 2018-12-03 DIAGNOSIS — C50411 Malignant neoplasm of upper-outer quadrant of right female breast: Secondary | ICD-10-CM

## 2018-12-03 DIAGNOSIS — Z170 Estrogen receptor positive status [ER+]: Secondary | ICD-10-CM

## 2018-12-04 DIAGNOSIS — S83242A Other tear of medial meniscus, current injury, left knee, initial encounter: Secondary | ICD-10-CM | POA: Diagnosis not present

## 2018-12-06 ENCOUNTER — Telehealth: Payer: Self-pay | Admitting: *Deleted

## 2018-12-11 ENCOUNTER — Telehealth: Payer: Self-pay | Admitting: Pharmacist

## 2018-12-11 DIAGNOSIS — C50411 Malignant neoplasm of upper-outer quadrant of right female breast: Secondary | ICD-10-CM

## 2018-12-11 DIAGNOSIS — Z170 Estrogen receptor positive status [ER+]: Secondary | ICD-10-CM

## 2018-12-11 MED ORDER — PALBOCICLIB 75 MG PO TABS: 75.0000 mg | ORAL_TABLET | Freq: Every day | ORAL | 6 refills | Status: DC

## 2018-12-11 NOTE — Telephone Encounter (Signed)
Oral Oncology Pharmacist Encounter  Ibrance (palbociclib) is changing formulation from capsules to tablets. New Ibrance prescription for the 75 mg tablets has been E scribed to the [facility].  Johny Drilling, PharmD, BCPS, BCOP  12/11/2018  3:51 PM Oral Oncology Clinic [phone]

## 2018-12-13 ENCOUNTER — Inpatient Hospital Stay: Payer: Medicare Other | Attending: Oncology

## 2018-12-13 ENCOUNTER — Other Ambulatory Visit: Payer: Self-pay

## 2018-12-13 DIAGNOSIS — Z170 Estrogen receptor positive status [ER+]: Secondary | ICD-10-CM | POA: Diagnosis not present

## 2018-12-13 DIAGNOSIS — Z86718 Personal history of other venous thrombosis and embolism: Secondary | ICD-10-CM | POA: Insufficient documentation

## 2018-12-13 DIAGNOSIS — Z7982 Long term (current) use of aspirin: Secondary | ICD-10-CM | POA: Insufficient documentation

## 2018-12-13 DIAGNOSIS — Z803 Family history of malignant neoplasm of breast: Secondary | ICD-10-CM | POA: Diagnosis not present

## 2018-12-13 DIAGNOSIS — Z79811 Long term (current) use of aromatase inhibitors: Secondary | ICD-10-CM | POA: Diagnosis not present

## 2018-12-13 DIAGNOSIS — Z7984 Long term (current) use of oral hypoglycemic drugs: Secondary | ICD-10-CM | POA: Diagnosis not present

## 2018-12-13 DIAGNOSIS — C50411 Malignant neoplasm of upper-outer quadrant of right female breast: Secondary | ICD-10-CM

## 2018-12-13 DIAGNOSIS — Z8542 Personal history of malignant neoplasm of other parts of uterus: Secondary | ICD-10-CM

## 2018-12-13 DIAGNOSIS — Z79899 Other long term (current) drug therapy: Secondary | ICD-10-CM | POA: Diagnosis not present

## 2018-12-13 DIAGNOSIS — E119 Type 2 diabetes mellitus without complications: Secondary | ICD-10-CM | POA: Diagnosis not present

## 2018-12-13 DIAGNOSIS — C7801 Secondary malignant neoplasm of right lung: Secondary | ICD-10-CM

## 2018-12-13 DIAGNOSIS — Z923 Personal history of irradiation: Secondary | ICD-10-CM | POA: Insufficient documentation

## 2018-12-13 DIAGNOSIS — C7800 Secondary malignant neoplasm of unspecified lung: Secondary | ICD-10-CM | POA: Diagnosis not present

## 2018-12-13 DIAGNOSIS — K760 Fatty (change of) liver, not elsewhere classified: Secondary | ICD-10-CM

## 2018-12-13 LAB — CBC WITH DIFFERENTIAL/PLATELET
Abs Immature Granulocytes: 0.02 10*3/uL (ref 0.00–0.07)
Basophils Absolute: 0 10*3/uL (ref 0.0–0.1)
Basophils Relative: 1 %
Eosinophils Absolute: 0.1 10*3/uL (ref 0.0–0.5)
Eosinophils Relative: 3 %
HCT: 33 % — ABNORMAL LOW (ref 36.0–46.0)
Hemoglobin: 11 g/dL — ABNORMAL LOW (ref 12.0–15.0)
Immature Granulocytes: 1 %
Lymphocytes Relative: 33 %
Lymphs Abs: 0.7 10*3/uL (ref 0.7–4.0)
MCH: 32.6 pg (ref 26.0–34.0)
MCHC: 33.3 g/dL (ref 30.0–36.0)
MCV: 97.9 fL (ref 80.0–100.0)
Monocytes Absolute: 0.2 10*3/uL (ref 0.1–1.0)
Monocytes Relative: 9 %
Neutro Abs: 1.1 10*3/uL — ABNORMAL LOW (ref 1.7–7.7)
Neutrophils Relative %: 53 %
Platelets: 164 10*3/uL (ref 150–400)
RBC: 3.37 MIL/uL — ABNORMAL LOW (ref 3.87–5.11)
RDW: 14.6 % (ref 11.5–15.5)
WBC: 2 10*3/uL — ABNORMAL LOW (ref 4.0–10.5)
nRBC: 0 % (ref 0.0–0.2)

## 2018-12-13 LAB — COMPREHENSIVE METABOLIC PANEL
ALT: 15 U/L (ref 0–44)
AST: 11 U/L — ABNORMAL LOW (ref 15–41)
Albumin: 3.8 g/dL (ref 3.5–5.0)
Alkaline Phosphatase: 65 U/L (ref 38–126)
Anion gap: 10 (ref 5–15)
BUN: 23 mg/dL (ref 8–23)
CO2: 25 mmol/L (ref 22–32)
Calcium: 8.7 mg/dL — ABNORMAL LOW (ref 8.9–10.3)
Chloride: 104 mmol/L (ref 98–111)
Creatinine, Ser: 1.07 mg/dL — ABNORMAL HIGH (ref 0.44–1.00)
GFR calc Af Amer: 60 mL/min — ABNORMAL LOW (ref >60)
GFR calc non Af Amer: 51 mL/min — ABNORMAL LOW (ref >60)
Glucose, Bld: 205 mg/dL — ABNORMAL HIGH (ref 70–99)
Potassium: 4.6 mmol/L (ref 3.5–5.1)
Sodium: 139 mmol/L (ref 135–145)
Total Bilirubin: 0.4 mg/dL (ref 0.3–1.2)
Total Protein: 6.7 g/dL (ref 6.5–8.1)

## 2018-12-13 MED FILL — IBRANCE 75 MG TABS: 75 | 28 days supply | Qty: 21 | Fill #0

## 2018-12-14 LAB — CANCER ANTIGEN 27.29: CA 27.29: 34.4 U/mL (ref 0.0–38.6)

## 2018-12-17 NOTE — Progress Notes (Signed)
[place]  [phone] Fax:([phone]) [phone]     ID: KARTHIKA GLASPER DOB: 1945/12/14  MR#: [id number]  NOB#:[id number]  Patient Care Team: Biagio Borg, MD as PCP - Stevan Born, MD as Consulting Physician (General Surgery) Giovany Cosby, Virgie Dad, MD as Consulting Physician (Oncology) Kyung Rudd, MD as Consulting Physician (Radiation Oncology) Bobbye Charleston, MD as Consulting Physician (Obstetrics and Gynecology) Nada Libman, MD as Referring Physician (Specialist) Vevelyn Royals, MD as Consulting Physician (Ophthalmology) Lamonte Sakai Rose Fillers, MD as Consulting Physician (Pulmonary Disease) Alexis Frock, MD as Consulting Physician (Urology) Ander Slade, Carlisle Beers, MD as Referring Physician (Ophthalmology) OTHER MD:   CHIEF COMPLAINT: Estrogen receptor positive breast cancer  CURRENT TREATMENT: Letrozole, palbociclib   INTERVAL HISTORY: Raushanah returns today for follow-up and treatment of her estrogen receptor positive stage IV breast cancer.  She continues on letrozole. She tolerates this well and without any noticeable side effects.    She also continues on palbociclib. She is currently in her off week, with tomorrow starting her first day of her new cycle. She tolerates this well and without any noticeable side effects.    In late June the patient may have had a COVID exposure.  She and her husband were tested.  Her test on 11/29/2018 was negative  There is no bone density screening on file.   Since her last visit here, she has not undergone any additional studies.     REVIEW OF SYSTEMS: Ashlei notes that she is extremely fatigued. She wakes up around 7 am, eats breakfast, goes back to sleep at 9 am, and wakes up at noon. She does have a very irregular sleep pattern at night. She wakes up in the middle of the night and is up for a few hours. For exercise, she likes to get outside and walk; she does have a walking cane and has not fallen. She  notes that she has been going blind and she can no longer drive.  She also said that she has started to develop sores on her skin. She has also started to have some hair loss. She has also been having leg cramps, mostly in the foot. More recently she has noticed that she has begun to choke while she is eating or drinking, to the point that it would make her throw up. She is chilled despite what would be considered comfortable, particularly indoors. The patient denies unusual headaches, nausea,  or dizziness. There has been no unusual cough, phlegm production, or pleurisy. This been no change in bladder habits. The patient denies unexplained weight loss, bleeding, or fever. A detailed review of systems was otherwise noncontributory.    BREAST CANCER HISTORY: From the original intake note:  Donatella had screening mammography showing some suspicious calcifications in the right breast leading to right diagnostic mammography with ultrasonography 01/22/2016 at [facility]. The breast density was category C. In the upper right breast there was a 2.3 cm mass with additional masses measuring 0.9 and 0.7 cm. There was also a possible additional 0.8 mass in the lower inner quadrant. Ultrasound confirmed an irregular hypoechoic mass in the right breast upper outer quadrant measuring 2.0 cm. There were other masses measuring 0.7 and 0.8 cm by ultrasonography. The right axilla was sonographically benign.  Biopsy of a 12:00 and 4:00 mass in the right breast 01/22/2016 showed ([id number]) both specimens showing invasive ductal carcinoma, grade 1 or 2, both 95% estrogen receptor positive, both 95% progesterone receptor positive, both with strong staining intensity,  with MIB-1 ranging from 10-15%, and both HER-2 negative, the signals ratio being 1.23-1.42, and the number per cell 1.85-2.59.  Her subsequent history is as detailed below    PAST MEDICAL HISTORY: Past Medical History:  Diagnosis Date   Breast cancer ([place])     Cancer ([place]) 02/2016   right breast   DIABETES MELLITUS, TYPE II 01/04/2007   only takes actoplus daily   Dizziness and giddiness 02/29/2008   DVT, HX OF    at age 73 in right buttocks   Dyspnea    due to lung cancer   Family history of breast cancer    GERD 01/04/2007   pt reports resolved    GLAUCOMA 07/30/2008   both eyes   History of blood transfusion    no abnormal  reaction   History of uterine cancer 2000   hysterectomy done   HYPERLIPIDEMIA 01/04/2007   taking Pravastatin daily   HYPERTENSION 01/04/2007   takes Lisinopril daily   Joint pain    Joint swelling    Leg cramps    LEG PAIN, LEFT 07/06/2007   NUMBNESS 07/30/2008   in fingers;pt states from [facility]   OSTEOARTHRITIS, HIP 09/25/2009   OTITIS MEDIA, ACUTE, BILATERAL 02/29/2008   Overweight(278.02) 01/04/2007   Peripheral vascular disease ([place])    Personal history of radiation therapy 2018   Pneumonia    PONV (postoperative nausea and vomiting)    SLEEP APNEA, OBSTRUCTIVE    doesn't use a cpap;study done about 17yr ago   [place] HX OF 01/04/2007   Vision loss    left eye    PAST SURGICAL HISTORY: Past Surgical History:  Procedure Laterality Date   ABDOMINAL HYSTERECTOMY  2000   BREAST LUMPECTOMY Right 01/13/2017   x2   BREAST LUMPECTOMY WITH RADIOACTIVE SEED AND SENTINEL LYMPH NODE BIOPSY Right 01/13/2017   Procedure: RIGHT BREAST RADIOACTIVE SEED X'S 2 GUIDED LUMPECTOMY WITH RADIOACTIVE SEED TARGETED AXILLARYLYMPH NODE EXCISION AND RIGHT AXILLARY SENTINEL LYMPH NODE BIOPSY;  Surgeon: NAlphonsa Overall MD;  [place] [facility]  Service: General;  Laterality: Right;  2 SEEDS IN RIGHT BREAST 1 SEED IN RIGHT AXILLARY NODE   CHOLECYSTECTOMY     ENDOBRONCHIAL ULTRASOUND Bilateral 03/28/2016   Procedure: ENDOBRONCHIAL ULTRASOUND;  Surgeon: RCollene Gobble MD;  Location: [facility];  Service: Cardiopulmonary;  Laterality: Bilateral;   EYE SURGERY  13   shunt left and lazer  eye surgery on right cataract and retenia tear with repair   growth removal  2004   from thumb   KNEE ARTHROSCOPY Right    mulitple eye surgeries     both eyes, cataracts with ioc done both eyes   OOPHORECTOMY     right lumpectomy with axillary node dissection Right 01/2017   TOTAL HIP ARTHROPLASTY  06/24/2011   Procedure: TOTAL HIP ARTHROPLASTY;  Surgeon: FKerin Salen  [place] [facility]  Service: Orthopedics;  Laterality: Right;   TOTAL HIP ARTHROPLASTY Left 11/12/2012   Dr RMayer Camel  TOTAL HIP ARTHROPLASTY Left 11/12/2012   Procedure: TOTAL HIP ARTHROPLASTY;  Surgeon: FKerin Salen MD;  [place] [facility]  Service: Orthopedics;  Laterality: Left;  DEPUY PINNACLE    FAMILY HISTORY Family History  Problem Relation Age of Onset   Dementia Mother    Cancer Mother        Breast and lung cancer   Stroke Sister    Breast cancer Sister [phone]  Heart attack Maternal Aunt    Lung cancer Maternal Grandmother  non smoker   Glaucoma Maternal Grandfather    Anesthesia problems Neg Hx    The patient's father died at age 44, the patient's mother died at age 60. She had breast and lung cancers diagnosed shortly before her death. The patient had no brothers, 2 sisters. One sister was diagnosed with breast cancer at the age of 66.   GYNECOLOGIC HISTORY:  No LMP recorded. Patient has had a hysterectomy. Menarche age 29, first live birth age 68, the patient is GX P1. She had a hysterectomy for endometrial cancer in the year 2000. She did not take hormone replacement. She did use oral contraceptives for more than 20 years remotely, with no complications.   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 She is home with [name]. He is a retired Dealer.Their son [name] also lives in [place].    ADVANCED DIRECTIVES: In place   HEALTH MAINTENANCE: Social History   Tobacco Use    Smoking status: Never Smoker   Smokeless tobacco: Never Used  Substance Use Topics   Alcohol use: No   Drug use: No     Colonoscopy: Never  PAP: Status post hysterectomy  Bone density: Remote   Allergies  Allergen Reactions   Codeine Hives    Hycodan syrup   Fluorescein Nausea And Vomiting    ? IV dye for retina specialist   Lipitor [Atorvastatin Calcium]     Leg cramp   Oxycodone Nausea And Vomiting    Patient vomited for 3 days after taking   Sitagliptin Phosphate Nausea And Vomiting   Sulfa Drugs Cross Reactors Nausea And Vomiting    Current Outpatient Medications  Medication Sig Dispense Refill   acetaminophen (TYLENOL) 500 MG tablet Take 1,000 mg by mouth every 4 (four) hours as needed for moderate pain or fever.     amLODipine (NORVASC) 10 MG tablet Take 1 tablet (10 mg total) by mouth daily. 90 tablet 3   aspirin EC 81 MG tablet Take 81 mg by mouth daily at 6 PM. 1700     bimatoprost (LUMIGAN) 0.01 % SOLN Place 1 drop into both eyes at bedtime.     brimonidine-timolol (COMBIGAN) 0.2-0.5 % ophthalmic solution Place 1 drop into both eyes three times daily     cholecalciferol (VITAMIN D) 1000 units tablet Take 1,000 Units by mouth daily.     dorzolamide-timolol (COSOPT) 22.3-6.8 MG/ML ophthalmic solution Place 1 drop into both eyes 2 (two) times daily.     letrozole (FEMARA) 2.5 MG tablet TAKE 1 TABLET BY MOUTH AT BEDTIME 30 tablet 0   lisinopril (PRINIVIL,ZESTRIL) 40 MG tablet Take 1 tablet (40 mg total) by mouth daily. 90 tablet 3   lovastatin (MEVACOR) 20 MG tablet Take 1 tablet (20 mg total) by mouth every evening. 90 tablet 3   palbociclib (IBRANCE) 75 MG tablet Take 1 tablet (75 mg total) by mouth daily. Take for 21 days on, 7 days off, repeat every 28 days. 21 tablet 6   pioglitazone-metformin (ACTOPLUS MET) 15-500 MG tablet Take 1 tablet by mouth daily. 90 tablet 3   No current facility-administered medications for this visit.        OBJECTIVE: Older white woman in no acute distress  Vitals:   12/18/18 1129  BP: (!) 151/56  Pulse: 72  Resp: 16  Temp: 98.7 F (37.1 C)  SpO2: 100%      Body mass index  is 42.29 kg/m.      ECOG FS:1 - Symptomatic but completely ambulatory   Filed Weights   12/18/18 1129  Weight: 246 lb 6.4 oz (111.8 kg)   Sclerae unicteric, EOMs intact Wearing a mask No cervical or supraclavicular adenopathy Lungs no rales or rhonchi Heart regular rate and rhythm Abd soft, nontender, positive bowel sounds MSK no focal spinal tenderness, no upper extremity lymphedema Neuro: nonfocal, well oriented, appropriate affect Breasts: Deferred   LAB RESULTS:  CMP     Component Value Date/Time   NA 139 12/13/2018 1123   NA 140 05/29/2017 1254   K 4.6 12/13/2018 1123   K 4.4 05/29/2017 1254   CL 104 12/13/2018 1123   CO2 25 12/13/2018 1123   CO2 25 05/29/2017 1254   GLUCOSE 205 (H) 12/13/2018 1123   GLUCOSE 154 (H) 05/29/2017 1254   BUN 23 12/13/2018 1123   BUN 11.5 05/29/2017 1254   CREATININE 1.07 (H) 12/13/2018 1123   CREATININE 0.80 04/02/2018 1116   CREATININE 0.8 05/29/2017 1254   CALCIUM 8.7 (L) 12/13/2018 1123   CALCIUM 9.3 05/29/2017 1254   PROT 6.7 12/13/2018 1123   PROT 7.0 05/29/2017 1254   ALBUMIN 3.8 12/13/2018 1123   ALBUMIN 4.1 05/29/2017 1254   AST 11 (L) 12/13/2018 1123   AST 13 (L) 04/02/2018 1116   AST 13 05/29/2017 1254   ALT 15 12/13/2018 1123   ALT 15 04/02/2018 1116   ALT 14 05/29/2017 1254   ALKPHOS 65 12/13/2018 1123   ALKPHOS 66 05/29/2017 1254   BILITOT 0.4 12/13/2018 1123   BILITOT 0.4 04/02/2018 1116   BILITOT 0.50 05/29/2017 1254   GFRNONAA 51 (L) 12/13/2018 1123   GFRNONAA >60 04/02/2018 1116   GFRAA 60 (L) 12/13/2018 1123   GFRAA >60 04/02/2018 1116    INo results found for: SPEP, UPEP  Lab Results  Component Value Date   WBC 2.0 (L) 12/13/2018   NEUTROABS 1.1 (L) 12/13/2018   HGB 11.0 (L) 12/13/2018   HCT 33.0 (L) 12/13/2018    MCV 97.9 12/13/2018   PLT 164 12/13/2018      Chemistry      Component Value Date/Time   NA 139 12/13/2018 1123   NA 140 05/29/2017 1254   K 4.6 12/13/2018 1123   K 4.4 05/29/2017 1254   CL 104 12/13/2018 1123   CO2 25 12/13/2018 1123   CO2 25 05/29/2017 1254   BUN 23 12/13/2018 1123   BUN 11.5 05/29/2017 1254   CREATININE 1.07 (H) 12/13/2018 1123   CREATININE 0.80 04/02/2018 1116   CREATININE 0.8 05/29/2017 1254      Component Value Date/Time   CALCIUM 8.7 (L) 12/13/2018 1123   CALCIUM 9.3 05/29/2017 1254   ALKPHOS 65 12/13/2018 1123   ALKPHOS 66 05/29/2017 1254   AST 11 (L) 12/13/2018 1123   AST 13 (L) 04/02/2018 1116   AST 13 05/29/2017 1254   ALT 15 12/13/2018 1123   ALT 15 04/02/2018 1116   ALT 14 05/29/2017 1254   BILITOT 0.4 12/13/2018 1123   BILITOT 0.4 04/02/2018 1116   BILITOT 0.50 05/29/2017 1254       No results found for: LABCA2  No components found for: [id number]  No results for input(s): INR in the last 168 hours.  Urinalysis    Component Value Date/Time   COLORURINE YELLOW 08/18/2017 1322   APPEARANCEUR HAZY (A) 08/18/2017 1322   LABSPEC 1.010 08/18/2017 1322   PHURINE 7.0 08/18/2017 1322   GLUCOSEU  NEGATIVE 08/18/2017 1322   GLUCOSEU NEGATIVE 10/30/2014 1513   HGBUR LARGE (A) 08/18/2017 1322   BILIRUBINUR NEGATIVE 08/18/2017 1322   KETONESUR NEGATIVE 08/18/2017 1322   PROTEINUR 30 (A) 08/18/2017 1322   UROBILINOGEN 0.2 10/30/2014 1513   NITRITE NEGATIVE 08/18/2017 1322   LEUKOCYTESUR LARGE (A) 08/18/2017 1322    STUDIES: No results found.  ELIGIBLE FOR AVAILABLE RESEARCH PROTOCOL: no  ASSESSMENT: 74 y.o. [place] woman with a remote history of early stage endometrial cancer, now status post right breast upper outer quadrant biopsy 01/22/2016 for a clinically multifocal T2 N0, stage 2A invasive ductal carcinoma, grade 1, estrogen and progesterone receptor positive, HER-2 negative, with an MIB-1 between 10 and 15%.  (1) right  axillary lymph node biopsy 03/02/2016 positive  (2) genetics testing 01/13/2016 through the Custom gene panel offered by [facility] found no deleterious mutations in  ATM, BARD1, BRCA1, BRCA2, BRIP1, CDH1, CHEK2, EPCAM, FANCC, MLH1, MSH2, MSH6, MUTYH, NBN, PALB2, PMS2, POLD1, PTEN, RAD51C, RAD51D, TP53, and XRCC2  METASTATIC DISEASE: OCT 2017 (3) CT scans of the chest abdomen and pelvis obtained 03/10/2016 are consistent with bilateral lung metastases and mediastinal and hilar nodal involvement, but no liver or bone spread  (a) bronchoscopic lymph node biopsy 2 (station 7, 13R) 03/28/2016 confirms metastatic adenocarcinoma, estrogen receptor positive, HER-2 not amplified  (b) baseline CA-27-29 on 04/18/2016 was 137.5.  (4) letrozole started 03/15/2016, palbociclib added 03/29/2016 at 125 mg/day, 21/7  (a) dose decreased to 100 mg per day, 21/7, beginning with February cycle  (b) palbociclib held 01/31/2017, with increasing symptoms  (c) palbociclib resumed October 2018 at 75 mg daily  (d) palbociclib dose reduced to 75 mg every other day February through April 2019  (e) palbociclib dose resumed at 75 mg daily as of 10/17/2017  (5) status post double right lumpectomies and right axillary lymph node sampling 01/13/2017 for 2 separate invasive ductal carcinoma lesions, pT1a and pT1b, N1a, with negative margins, both lesions being estrogen and progesterone receptor positive and HER-2 negative  (6) adjuvant radiation completed 07/05/2017 1. 50.4 Gy in 28 fractions to the right breast and supraclavicular region using whole-breast tangent fields. 2. Boost to the seroma delivered an additional 10 Gy in 5 fractions. The total dose was 60.4 Gy.  (7) restaging studies:  (a) CT scan of the chest and bone scan 06/23/2017 showed stable scattered very small lung nodules, no bone lesions  (b) CT of the chest 10/17/2017 showed no new or progressive metastatic disease in the chest. The small left lower lobe  pulmonary nodule is stable  (c) PET scan on 03/02/2018: shows no findings for residual or recurrent right breast cancer   (8) right upper pole renal lesion noted to be enlarging on CT scan 06/22/2017  (a) no uptake on PET scan obtained 03/02/2018   PLAN: Armentha i is now 3 years out from definitive diagnosis of metastatic breast cancer.  Her disease is well controlled.  She is tolerating her treatment well.  We are continuing the letrozole and palbociclib at the current dose.  The white cell count is acceptable.  It is possible that in a few months we may need to drop the palbociclib dose further  I think she is severely deconditioned and I suggested that she exercise twice daily what ever she can get done.  If all she can do is 3 minutes on the bike then that is what she should do.  If she does that daily however after a while she will be  able to stay on the stationary bike anywhere from 10 to 30 minutes twice daily.  I think she would feel considerably better if she could do that  We discussed swallowing dysfunction which is a common issue as patients become more mature.  There are ways that she can deal with this and we discussed it.  I was sorry to learn that she continues to lose her vision.  She is taking appropriate COVID precautions  She will return in 3 months.  Before that visit we will do a repeat PET scan.  She knows to call for any other issue that may develop before her return here.       Virgie Dad. Jaquez Farrington, MD  12/18/18 1:00 PM Medical Oncology and Hematology [facility] [address] [place], [place] [address] Tel. [phone]    Fax. [phone]   I, Jacqualyn Posey am acting as a Education administrator for Chauncey Cruel, MD.   I, Lurline Del MD, have reviewed the above documentation for accuracy and completeness, and I agree with the above.

## 2018-12-18 ENCOUNTER — Other Ambulatory Visit: Payer: Self-pay

## 2018-12-18 ENCOUNTER — Inpatient Hospital Stay (HOSPITAL_BASED_OUTPATIENT_CLINIC_OR_DEPARTMENT_OTHER): Payer: Medicare Other | Admitting: Oncology

## 2018-12-18 VITALS — BP 151/56 | HR 72 | Temp 98.7°F | Resp 16 | Ht 64.0 in | Wt 246.4 lb

## 2018-12-18 DIAGNOSIS — C7800 Secondary malignant neoplasm of unspecified lung: Secondary | ICD-10-CM | POA: Diagnosis not present

## 2018-12-18 DIAGNOSIS — Z803 Family history of malignant neoplasm of breast: Secondary | ICD-10-CM | POA: Diagnosis not present

## 2018-12-18 DIAGNOSIS — Z79811 Long term (current) use of aromatase inhibitors: Secondary | ICD-10-CM

## 2018-12-18 DIAGNOSIS — Z170 Estrogen receptor positive status [ER+]: Secondary | ICD-10-CM

## 2018-12-18 DIAGNOSIS — Z7982 Long term (current) use of aspirin: Secondary | ICD-10-CM | POA: Diagnosis not present

## 2018-12-18 DIAGNOSIS — Z923 Personal history of irradiation: Secondary | ICD-10-CM

## 2018-12-18 DIAGNOSIS — Z86718 Personal history of other venous thrombosis and embolism: Secondary | ICD-10-CM

## 2018-12-18 DIAGNOSIS — C50411 Malignant neoplasm of upper-outer quadrant of right female breast: Secondary | ICD-10-CM

## 2018-12-18 DIAGNOSIS — E119 Type 2 diabetes mellitus without complications: Secondary | ICD-10-CM

## 2018-12-18 DIAGNOSIS — Z7984 Long term (current) use of oral hypoglycemic drugs: Secondary | ICD-10-CM | POA: Diagnosis not present

## 2018-12-18 DIAGNOSIS — Z79899 Other long term (current) drug therapy: Secondary | ICD-10-CM | POA: Diagnosis not present

## 2018-12-18 MED ORDER — PALBOCICLIB 75 MG PO TABS: 75.0000 mg | ORAL_TABLET | Freq: Every day | ORAL | 6 refills | Status: DC

## 2018-12-18 MED ORDER — LETROZOLE 2.5 MG PO TABS: 2.5000 mg | ORAL_TABLET | Freq: Every day | ORAL | 4 refills | Status: DC

## 2018-12-19 ENCOUNTER — Telehealth: Payer: Self-pay | Admitting: Oncology

## 2018-12-19 NOTE — Telephone Encounter (Signed)
I talk with patient regarding schedule  

## 2019-01-14 MED FILL — IBRANCE 75 MG TABS: 75 | 28 days supply | Qty: 21 | Fill #0

## 2019-01-18 ENCOUNTER — Other Ambulatory Visit: Payer: Self-pay

## 2019-01-18 ENCOUNTER — Telehealth: Payer: Self-pay | Admitting: Internal Medicine

## 2019-01-18 ENCOUNTER — Encounter: Payer: Self-pay | Admitting: Internal Medicine

## 2019-01-18 ENCOUNTER — Ambulatory Visit (INDEPENDENT_AMBULATORY_CARE_PROVIDER_SITE_OTHER): Payer: Medicare Other | Admitting: Internal Medicine

## 2019-01-18 VITALS — BP 124/74 | HR 91 | Temp 98.4°F | Ht 64.0 in | Wt 249.0 lb

## 2019-01-18 DIAGNOSIS — F418 Other specified anxiety disorders: Secondary | ICD-10-CM | POA: Diagnosis not present

## 2019-01-18 DIAGNOSIS — Z0001 Encounter for general adult medical examination with abnormal findings: Secondary | ICD-10-CM

## 2019-01-18 DIAGNOSIS — I10 Essential (primary) hypertension: Secondary | ICD-10-CM

## 2019-01-18 DIAGNOSIS — Z0000 Encounter for general adult medical examination without abnormal findings: Secondary | ICD-10-CM | POA: Diagnosis not present

## 2019-01-18 DIAGNOSIS — E119 Type 2 diabetes mellitus without complications: Secondary | ICD-10-CM

## 2019-01-18 DIAGNOSIS — R609 Edema, unspecified: Secondary | ICD-10-CM | POA: Diagnosis not present

## 2019-01-18 MED ORDER — AMLODIPINE BESYLATE 5 MG PO TABS: 5.0000 mg | ORAL_TABLET | Freq: Every day | ORAL | 3 refills | Status: DC

## 2019-01-18 MED ORDER — HYDROCHLOROTHIAZIDE 25 MG PO TABS: 12.5000 mg | ORAL_TABLET | Freq: Every day | ORAL | 3 refills | Status: DC

## 2019-01-18 NOTE — Telephone Encounter (Signed)
Since amlodipine is not a treatment for swelling, please consider ROV

## 2019-01-18 NOTE — Telephone Encounter (Signed)
Appointment scheduled.

## 2019-01-18 NOTE — Progress Notes (Signed)
Subjective:    Patient ID: [name], female    DOB: 02/05/1946, 73 y.o.   MRN: [id number]  HPI  Here for wellness and f/u;  Overall doing ok;  Pt denies neurological change such as new headache, facial or extremity weakness.  Pt denies polydipsia, polyuria, or low sugar symptoms. Pt states overall good compliance with treatment and medications, good tolerability, and has been trying to follow appropriate diet.   No fever, night sweats, wt loss, loss of appetite, or other constitutional symptoms.  Pt states good ability with ADL's, has low fall risk, home safety reviewed and adequate, no other significant changes in hearing or vision, and only occasionally active with exercise. Wt Readings from Last 3 Encounters:  01/18/19 249 lb (112.9 kg)  12/18/18 246 lb 6.4 oz (111.8 kg)  09/04/18 243 lb (110.2 kg)   BP Readings from Last 3 Encounters:  01/18/19 124/74  12/18/18 (!) 151/56  09/04/18 134/88  Also, Pt denies chest pain, increased sob or doe, wheezing, orthopnea, PND, palpitations, dizziness or syncope, except there is increased bilateral LE swelling since the increased amlodipine to 10 mg about mar 2020.  Denies worsening depressive symptoms, suicidal ideation, or panic; has ongoing anxiety. Past Medical History:  Diagnosis Date  . Breast cancer ([place])   . Cancer ([place]) 02/2016   right breast  . DIABETES MELLITUS, TYPE II 01/04/2007   only takes actoplus daily  . Dizziness and giddiness 02/29/2008  . DVT, HX OF    at age 55 in right buttocks  . Dyspnea    due to lung cancer  . Family history of breast cancer   . GERD 01/04/2007   pt reports resolved   . GLAUCOMA 07/30/2008   both eyes  . History of blood transfusion    no abnormal  reaction  . History of uterine cancer 2000   hysterectomy done  . HYPERLIPIDEMIA 01/04/2007   taking Pravastatin daily  . HYPERTENSION 01/04/2007   takes Lisinopril daily  . Joint pain   . Joint swelling   . Leg cramps   . LEG PAIN, LEFT  07/06/2007  . NUMBNESS 07/30/2008   in fingers;pt states from [facility]  . OSTEOARTHRITIS, HIP 09/25/2009  . OTITIS MEDIA, ACUTE, BILATERAL 02/29/2008  . Overweight(278.02) 01/04/2007  . Peripheral vascular disease ([place])   . Personal history of radiation therapy 2018  . Pneumonia   . PONV (postoperative nausea and vomiting)   . SLEEP APNEA, OBSTRUCTIVE    doesn't use a cpap;study done about 75yr ago  . TRANSIENT ISCHEMIC ATTACK, HX OF 01/04/2007  . Vision loss    left eye   Past Surgical History:  Procedure Laterality Date  . ABDOMINAL HYSTERECTOMY  2000  . BREAST LUMPECTOMY Right 01/13/2017   x2  . BREAST LUMPECTOMY WITH RADIOACTIVE SEED AND SENTINEL LYMPH NODE BIOPSY Right 01/13/2017   Procedure: RIGHT BREAST RADIOACTIVE SEED X'S 2 GUIDED LUMPECTOMY WITH RADIOACTIVE SEED TARGETED AXILLARYLYMPH NODE EXCISION AND RIGHT AXILLARY SENTINEL LYMPH NODE BIOPSY;  Surgeon: NAlphonsa Overall MD;  [facility]  Service: General;  Laterality: Right;  2 SEEDS IN RIGHT BREAST 1 SEED IN RIGHT AXILLARY NODE  . CHOLECYSTECTOMY    . ENDOBRONCHIAL ULTRASOUND Bilateral 03/28/2016   Procedure: ENDOBRONCHIAL ULTRASOUND;  Surgeon: RCollene Gobble MD;  Location: [facility];  Service: Cardiopulmonary;  Laterality: Bilateral;  . EYE SURGERY  13   shunt left and lazer eye surgery on right cataract and retenia tear with repair  . growth removal  2004   from thumb  . KNEE ARTHROSCOPY Right   . mulitple eye surgeries     both eyes, cataracts with ioc done both eyes  . OOPHORECTOMY    . right lumpectomy with axillary node dissection Right 01/2017  . TOTAL HIP ARTHROPLASTY  06/24/2011   Procedure: TOTAL HIP ARTHROPLASTY;  Surgeon: Kerin Salen;  Location: [facility];  Service: Orthopedics;  Laterality: Right;  . TOTAL HIP ARTHROPLASTY Left 11/12/2012   Dr Mayer Camel  . TOTAL HIP ARTHROPLASTY Left 11/12/2012   Procedure: TOTAL HIP ARTHROPLASTY;  Surgeon: Kerin Salen, MD;  Location: [facility];  Service: Orthopedics;  Laterality:  Left;  DEPUY PINNACLE    reports that she has never smoked. She has never used smokeless tobacco. She reports that she does not drink alcohol or use drugs. family history includes Breast cancer (age of onset: 35) in her sister; Cancer in her mother; Dementia in her mother; Glaucoma in her maternal grandfather; Heart attack in her maternal aunt; Lung cancer in her maternal grandmother; Stroke in her sister. Allergies  Allergen Reactions  . Codeine Hives    Hycodan syrup  . Fluorescein Nausea And Vomiting    ? IV dye for retina specialist  . Lipitor [Atorvastatin Calcium]     Leg cramp  . Oxycodone Nausea And Vomiting    Patient vomited for 3 days after taking  . Sitagliptin Phosphate Nausea And Vomiting  . Sulfa Drugs Cross Reactors Nausea And Vomiting   Current Outpatient Medications on File Prior to Visit  Medication Sig Dispense Refill  . acetaminophen (TYLENOL) 500 MG tablet Take 1,000 mg by mouth every 4 (four) hours as needed for moderate pain or fever.    Marland Kitchen aspirin EC 81 MG tablet Take 81 mg by mouth daily at 6 PM. 1700    . bimatoprost (LUMIGAN) 0.01 % SOLN Place 1 drop into both eyes at bedtime.    . brimonidine-timolol (COMBIGAN) 0.2-0.5 % ophthalmic solution Place 1 drop into both eyes three times daily    . cholecalciferol (VITAMIN D) 1000 units tablet Take 1,000 Units by mouth daily.    . dorzolamide-timolol (COSOPT) 22.3-6.8 MG/ML ophthalmic solution Place 1 drop into both eyes 2 (two) times daily.    Marland Kitchen letrozole (FEMARA) 2.5 MG tablet Take 1 tablet (2.5 mg total) by mouth at bedtime. 90 tablet 4  . lisinopril (PRINIVIL,ZESTRIL) 40 MG tablet Take 1 tablet (40 mg total) by mouth daily. 90 tablet 3  . lovastatin (MEVACOR) 20 MG tablet Take 1 tablet (20 mg total) by mouth every evening. 90 tablet 3  . palbociclib (IBRANCE) 75 MG tablet Take 1 tablet (75 mg total) by mouth daily. Take for 21 days on, 7 days off, repeat every 28 days. 21 tablet 6  . pioglitazone-metformin  (ACTOPLUS MET) 15-500 MG tablet Take 1 tablet by mouth daily. 90 tablet 3   No current facility-administered medications on file prior to visit.    Review of Systems Constitutional: Negative for other unusual diaphoresis, sweats, appetite or weight changes HENT: Negative for other worsening hearing loss, ear pain, facial swelling, mouth sores or neck stiffness.   Eyes: Negative for other worsening pain, redness or other visual disturbance.  Respiratory: Negative for other stridor or swelling Cardiovascular: Negative for other palpitations or other chest pain  Gastrointestinal: Negative for worsening diarrhea or loose stools, blood in stool, distention or other pain Genitourinary: Negative for hematuria, flank pain or other change in urine volume.  Musculoskeletal: Negative for myalgias or  other joint swelling.  Skin: Negative for other color change, or other wound or worsening drainage.  Neurological: Negative for other syncope or numbness. Hematological: Negative for other adenopathy or swelling Psychiatric/Behavioral: Negative for hallucinations, other worsening agitation, SI, self-injury, or new decreased concentration All other system neg per pt    Objective:   Physical Exam BP 124/74   Pulse 91   Temp 98.4 F (36.9 C) (Oral)   Ht '5\' 4"'$  (1.626 m)   Wt 249 lb (112.9 kg)   SpO2 96%   BMI 42.74 kg/m  VS noted,  Constitutional: Pt is oriented to person, place, and time. Appears well-developed and well-nourished, in no significant distress and comfortable Head: Normocephalic and atraumatic  Eyes: Conjunctivae and EOM are normal. Pupils are equal, round, and reactive to light Right Ear: External ear normal without discharge Left Ear: External ear normal without discharge Nose: Nose without discharge or deformity Mouth/Throat: Oropharynx is without other ulcerations and moist  Neck: Normal range of motion. Neck supple. No JVD present. No tracheal deviation present or significant  neck LA or mass Cardiovascular: Normal rate, regular rhythm, normal heart sounds and intact distal pulses.   Pulmonary/Chest: WOB normal and breath sounds without rales or wheezing  Abdominal: Soft. Bowel sounds are normal. NT. No HSM  Musculoskeletal: Normal range of motion. Exhibits 1+ bilat LE edema, I cannot appreciated RUE swelling Lymphadenopathy: Has no other cervical adenopathy.  Neurological: Pt is alert and oriented to person, place, and time. Pt has normal reflexes. No cranial nerve deficit. Motor grossly intact, Gait intact Skin: Skin is warm and dry. No rash noted or new ulcerations Psychiatric:  Has depressed nervous irritable mood and affect. Behavior is normal without agitation No other exam findings  Lab Results  Component Value Date   WBC 2.0 (L) 12/13/2018   HGB 11.0 (L) 12/13/2018   HCT 33.0 (L) 12/13/2018   PLT 164 12/13/2018   GLUCOSE 205 (H) 12/13/2018   CHOL 195 04/29/2015   TRIG 129.0 04/29/2015   HDL 52.40 04/29/2015   LDLCALC 117 (H) 04/29/2015   ALT 15 12/13/2018   AST 11 (L) 12/13/2018   NA 139 12/13/2018   K 4.6 12/13/2018   CL 104 12/13/2018   CREATININE 1.07 (H) 12/13/2018   BUN 23 12/13/2018   CO2 25 12/13/2018   TSH 1.172 09/24/2018   INR 1.06 11/08/2012   HGBA1C 6.7 (A) 09/04/2018   MICROALBUR <0.7 10/30/2014   Declines labs today    Assessment & Plan:

## 2019-01-18 NOTE — Telephone Encounter (Signed)
Pt states she was advised to call and let Dr Jenny Reichmann know if the welling on her rt side (arm/leg) continued after adding amLODipine (NORVASC) 10 MG tablet.  Pt states swelling has continued and would like to know what Dr Jenny Reichmann advises as far as next steps.  Please contact pt: [phone]

## 2019-01-19 ENCOUNTER — Encounter: Payer: Self-pay | Admitting: Internal Medicine

## 2019-01-19 DIAGNOSIS — R609 Edema, unspecified: Secondary | ICD-10-CM | POA: Insufficient documentation

## 2019-01-19 NOTE — Assessment & Plan Note (Signed)
Controlled but with reduced amlodipine will need to add HCT 12.5 qd

## 2019-01-19 NOTE — Assessment & Plan Note (Addendum)
Edema most likely amlodipine related, to decreased the amlodipine to 5 mg  In addition to the time spent performing CPE, I spent an additional 25 minutes face to face,in which greater than 50% of this time was spent in counseling and coordination of care for patient's acute illness as documented, including the differential dx, treatment, further evaluation and other management of peripheral edema , HTN, DM, and depression anxiety

## 2019-01-19 NOTE — Assessment & Plan Note (Signed)
stable overall by history and exam, recent data reviewed with pt, and pt to continue medical treatment as before,  to f/u any worsening symptoms or concerns  

## 2019-01-19 NOTE — Assessment & Plan Note (Signed)
stable overall by history and exam, recent data reviewed with pt, and pt to continue medical treatment as before,  to f/u any worsening symptoms or concerns, declines f/u lab today

## 2019-01-19 NOTE — Assessment & Plan Note (Signed)

## 2019-01-21 ENCOUNTER — Telehealth: Payer: Self-pay | Admitting: Internal Medicine

## 2019-01-21 NOTE — Telephone Encounter (Signed)
Pt states she was told to take 1/2 of a hydrochlorothiazide (HYDRODIURIL) 25 MG tablet.  Pt states that she can cut it in half but only one half comes out whole - the remainder of the pill turns into powder.  Pt wants to know if she should take a full pill every other day?

## 2019-01-21 NOTE — Telephone Encounter (Signed)
No, I would still take half but try to use a pill cutter from the pharmacy and not a knife as this works well for other patients

## 2019-01-22 MED ORDER — HYDROCHLOROTHIAZIDE 12.5 MG PO CAPS: 12.5000 mg | ORAL_CAPSULE | Freq: Every day | ORAL | 3 refills | Status: DC

## 2019-01-22 NOTE — Telephone Encounter (Signed)
This is the first patient I know of in 25 yrs who has not been able to successfully cut the pill, maybe several thousand patients.  This is very unusual  And a bit odd  I have changed to the 12.5 mg capsule - done erx

## 2019-01-22 NOTE — Telephone Encounter (Signed)
Pt has been informed.

## 2019-01-22 NOTE — Telephone Encounter (Signed)
Pt stated that she is using a pill cutter but the issue is that the pill is "smaller than an 81mg  of Asprin". She stated after she cuts it one half is fine but the other half is powder. Please advise.

## 2019-01-22 NOTE — Addendum Note (Signed)
Addended by: Biagio Borg on: 01/22/2019 12:56 PM   Modules accepted: Orders

## 2019-02-05 ENCOUNTER — Other Ambulatory Visit: Payer: Self-pay | Admitting: *Deleted

## 2019-02-05 ENCOUNTER — Telehealth: Payer: Self-pay | Admitting: *Deleted

## 2019-02-07 MED FILL — IBRANCE 75 MG TABS: 75 | 28 days supply | Qty: 21 | Fill #1

## 2019-02-19 ENCOUNTER — Ambulatory Visit (HOSPITAL_COMMUNITY)
Admission: RE | Admit: 2019-02-19 | Discharge: 2019-02-19 | Disposition: A | Discharge status: Home / Self Care | Payer: Medicare Other | Source: Ambulatory Visit | Attending: Oncology | Admitting: Oncology

## 2019-02-19 ENCOUNTER — Other Ambulatory Visit: Payer: Self-pay

## 2019-02-19 DIAGNOSIS — Z170 Estrogen receptor positive status [ER+]: Secondary | ICD-10-CM | POA: Insufficient documentation

## 2019-02-19 DIAGNOSIS — I2510 Atherosclerotic heart disease of native coronary artery without angina pectoris: Secondary | ICD-10-CM | POA: Diagnosis not present

## 2019-02-19 DIAGNOSIS — I700 Atherosclerosis of aorta: Secondary | ICD-10-CM | POA: Insufficient documentation

## 2019-02-19 DIAGNOSIS — C50411 Malignant neoplasm of upper-outer quadrant of right female breast: Secondary | ICD-10-CM | POA: Insufficient documentation

## 2019-02-19 DIAGNOSIS — Z9049 Acquired absence of other specified parts of digestive tract: Secondary | ICD-10-CM | POA: Insufficient documentation

## 2019-02-19 DIAGNOSIS — Z923 Personal history of irradiation: Secondary | ICD-10-CM | POA: Insufficient documentation

## 2019-02-19 DIAGNOSIS — C50911 Malignant neoplasm of unspecified site of right female breast: Secondary | ICD-10-CM | POA: Diagnosis not present

## 2019-02-19 DIAGNOSIS — K449 Diaphragmatic hernia without obstruction or gangrene: Secondary | ICD-10-CM | POA: Insufficient documentation

## 2019-02-19 LAB — GLUCOSE, CAPILLARY: Glucose-Capillary: 170 mg/dL — ABNORMAL HIGH (ref 70–99)

## 2019-02-19 MED ORDER — FLUDEOXYGLUCOSE F - 18 (FDG) INJECTION
12.3800 | Freq: Once | INTRAVENOUS | Status: AC | PRN
  Administered 2019-02-19: 12.38 via INTRAVENOUS

## 2019-02-25 DIAGNOSIS — Z23 Encounter for immunization: Secondary | ICD-10-CM | POA: Diagnosis not present

## 2019-03-05 MED FILL — IBRANCE 75 MG TABS: 75 | 28 days supply | Qty: 21 | Fill #2

## 2019-03-05 NOTE — Progress Notes (Signed)
[name]  [phone] [phone]     ID: [name] DOB: 1946-02-27  MR#: [id number]  WCB#:[id number]  Patient Care Team: Biagio Borg, MD as PCP - Stevan Born, MD as Consulting Physician (General Surgery) Aowyn Rozeboom, Virgie Dad, MD as Consulting Physician (Oncology) Kyung Rudd, MD as Consulting Physician (Radiation Oncology) Bobbye Charleston, MD as Consulting Physician (Obstetrics and Gynecology) Nada Libman, MD as Referring Physician (Specialist) Vevelyn Royals, MD as Consulting Physician (Ophthalmology) Lamonte Sakai Rose Fillers, MD as Consulting Physician (Pulmonary Disease) Alexis Frock, MD as Consulting Physician (Urology) Ander Slade, Carlisle Beers, MD as Referring Physician (Ophthalmology) OTHER MD:   CHIEF COMPLAINT: Estrogen receptor positive breast cancer  CURRENT TREATMENT: Letrozole, palbociclib   INTERVAL HISTORY: [name] returns today for follow-up and treatment of her estrogen receptor positive stage IV breast cancer. She was last seen here on 12/18/2018.   She continues on letrozole.  She tolerates this well with no hot flashes or vaginal dryness issues.  She obtains a very good price.  She is also on palbociclib.  She thinks this is causing her hair loss and to feel tired.  She has no nausea or rash or diarrhea associated with this.  Thre is no bone density screening on file.   Since her last visit here, she underwent a PET scan on 02/19/2019 showing: Radiation changes within the right breast, without findings of recurrent or metastatic disease. Cholecystectomy with persistent biliary duct dilatation, possibly progressive. Correlate with bilirubin levels. Incidental findings, including hiatal hernia, aortic and coronary artery atherosclerosis.    REVIEW OF SYSTEMS: [name] is keeping appropriate pandemic precautions.  Her husband does the shopping and also the cooking.  She does the housework.  She is not exercising  regularly.  She has had no unusual headaches, visual changes, cough, phlegm production, pleurisy, shortness of breath, or any change in bladder habits.  A detailed review of systems was otherwise stable.   BREAST CANCER HISTORY: From the original intake note:  [place] had screening mammography showing some suspicious calcifications in the right breast leading to right diagnostic mammography with ultrasonography 01/22/2016 at [facility]. The breast density was category C. In the upper right breast there was a 2.3 cm mass with additional masses measuring 0.9 and 0.7 cm. There was also a possible additional 0.8 mass in the lower inner quadrant. Ultrasound confirmed an irregular hypoechoic mass in the right breast upper outer quadrant measuring 2.0 cm. There were other masses measuring 0.7 and 0.8 cm by ultrasonography. The right axilla was sonographically benign.  Biopsy of a 12:00 and 4:00 mass in the right breast 01/22/2016 showed ([id number]) both specimens showing invasive ductal carcinoma, grade 1 or 2, both 95% estrogen receptor positive, both 95% progesterone receptor positive, both with strong staining intensity, with MIB-1 ranging from 10-15%, and both HER-2 negative, the signals ratio being 1.23-1.42, and the number per cell 1.85-2.59.  Her subsequent history is as detailed below    PAST MEDICAL HISTORY: Past Medical History:  Diagnosis Date  . Breast cancer ([place])   . Cancer ([place]) 02/2016   right breast  . DIABETES MELLITUS, TYPE II 01/04/2007   only takes actoplus daily  . Dizziness and giddiness 02/29/2008  . DVT, HX OF    at age 75 in right buttocks  . Dyspnea    due to lung cancer  . Family history of breast cancer   . GERD 01/04/2007   pt reports resolved   . GLAUCOMA 07/30/2008   both  eyes  . History of blood transfusion    no abnormal  reaction  . History of uterine cancer 2000   hysterectomy done  . HYPERLIPIDEMIA 01/04/2007   taking Pravastatin daily  . HYPERTENSION  01/04/2007   takes Lisinopril daily  . Joint pain   . Joint swelling   . Leg cramps   . LEG PAIN, LEFT 07/06/2007  . NUMBNESS 07/30/2008   in fingers;pt states from Diamox  . OSTEOARTHRITIS, HIP 09/25/2009  . OTITIS MEDIA, ACUTE, BILATERAL 02/29/2008  . Overweight(278.02) 01/04/2007  . Peripheral vascular disease ([place])   . Personal history of radiation therapy 2018  . Pneumonia   . PONV (postoperative nausea and vomiting)   . SLEEP APNEA, OBSTRUCTIVE    doesn't use a cpap;study done about 45yr ago  . TRANSIENT ISCHEMIC ATTACK, HX OF 01/04/2007  . Vision loss    left eye    PAST SURGICAL HISTORY: Past Surgical History:  Procedure Laterality Date  . ABDOMINAL HYSTERECTOMY  2000  . BREAST LUMPECTOMY Right 01/13/2017   x2  . BREAST LUMPECTOMY WITH RADIOACTIVE SEED AND SENTINEL LYMPH NODE BIOPSY Right 01/13/2017   Procedure: RIGHT BREAST RADIOACTIVE SEED X'S 2 GUIDED LUMPECTOMY WITH RADIOACTIVE SEED TARGETED AXILLARYLYMPH NODE EXCISION AND RIGHT AXILLARY SENTINEL LYMPH NODE BIOPSY;  Surgeon: NAlphonsa Overall MD;  [facility]  Service: General;  Laterality: Right;  2 SEEDS IN RIGHT BREAST 1 SEED IN RIGHT AXILLARY NODE  . CHOLECYSTECTOMY    . ENDOBRONCHIAL ULTRASOUND Bilateral 03/28/2016   Procedure: ENDOBRONCHIAL ULTRASOUND;  Surgeon: RCollene Gobble MD;  Location: [facility];  Service: Cardiopulmonary;  Laterality: Bilateral;  . EYE SURGERY  13   shunt left and lazer eye surgery on right cataract and retenia tear with repair  . growth removal  2004   from thumb  . KNEE ARTHROSCOPY Right   . mulitple eye surgeries     both eyes, cataracts with ioc done both eyes  . OOPHORECTOMY    . right lumpectomy with axillary node dissection Right 01/2017  . TOTAL HIP ARTHROPLASTY  06/24/2011   Procedure: TOTAL HIP ARTHROPLASTY;  Surgeon: FKerin Salen  [facility]  Service: Orthopedics;  Laterality: Right;  . TOTAL HIP ARTHROPLASTY Left 11/12/2012   Dr RMayer Camel . TOTAL HIP ARTHROPLASTY  Left 11/12/2012   Procedure: TOTAL HIP ARTHROPLASTY;  Surgeon: FKerin Salen MD;  [facility]  Service: Orthopedics;  Laterality: Left;  DEPUY PINNACLE    FAMILY HISTORY Family History  Problem Relation Age of Onset  . Dementia Mother   . Cancer Mother        Breast and lung cancer  . Stroke Sister   . Breast cancer Sister 536 . Heart attack Maternal Aunt   . Lung cancer Maternal Grandmother        non smoker  . Glaucoma Maternal Grandfather   . Anesthesia problems Neg Hx    The patient's father died at age 73 the patient's mother died at age 73 She had breast and lung cancers diagnosed shortly before her death. The patient had no brothers, 2 sisters. One sister was diagnosed with breast cancer at the age of 56   GYNECOLOGIC HISTORY:  No LMP recorded. Patient has had a hysterectomy. Menarche age 73 first live birth age 73 the patient is GX P1. She had a hysterectomy for endometrial cancer in the year 2000. She did not take hormone replacement. She did use oral contraceptives for more than 20 years remotely, with  no complications.   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 She is home with [name]. He is a retired Dealer.Their son [name] also lives in [place].    ADVANCED DIRECTIVES: In place   HEALTH MAINTENANCE: Social History   Tobacco Use  . Smoking status: Never Smoker  . Smokeless tobacco: Never Used  Substance Use Topics  . Alcohol use: No  . Drug use: No     Colonoscopy: Never  PAP: Status post hysterectomy  Bone density: Remote   Allergies  Allergen Reactions  . Codeine Hives    Hycodan syrup  . Fluorescein Nausea And Vomiting    ? IV dye for retina specialist  . Lipitor [Atorvastatin Calcium]     Leg cramp  . Oxycodone Nausea And Vomiting    Patient vomited for 3 days after taking  . Sitagliptin Phosphate Nausea And Vomiting  . Sulfa Drugs  Cross Reactors Nausea And Vomiting    Current Outpatient Medications  Medication Sig Dispense Refill  . acetaminophen (TYLENOL) 500 MG tablet Take 1,000 mg by mouth every 4 (four) hours as needed for moderate pain or fever.    Marland Kitchen amLODipine (NORVASC) 5 MG tablet Take 1 tablet (5 mg total) by mouth daily. 90 tablet 3  . aspirin EC 81 MG tablet Take 81 mg by mouth daily at 6 PM. 1700    . bimatoprost (LUMIGAN) 0.01 % SOLN Place 1 drop into both eyes at bedtime.    . brimonidine-timolol (COMBIGAN) 0.2-0.5 % ophthalmic solution Place 1 drop into both eyes three times daily    . cholecalciferol (VITAMIN D) 1000 units tablet Take 1,000 Units by mouth daily.    . dorzolamide-timolol (COSOPT) 22.3-6.8 MG/ML ophthalmic solution Place 1 drop into both eyes 2 (two) times daily.    . hydrochlorothiazide (MICROZIDE) 12.5 MG capsule Take 1 capsule (12.5 mg total) by mouth daily. 90 capsule 3  . letrozole (FEMARA) 2.5 MG tablet Take 1 tablet (2.5 mg total) by mouth at bedtime. 90 tablet 4  . lisinopril (PRINIVIL,ZESTRIL) 40 MG tablet Take 1 tablet (40 mg total) by mouth daily. 90 tablet 3  . lovastatin (MEVACOR) 20 MG tablet Take 1 tablet (20 mg total) by mouth every evening. 90 tablet 3  . palbociclib (IBRANCE) 75 MG tablet Take 1 tablet (75 mg total) by mouth daily. Take for 21 days on, 7 days off, repeat every 28 days. 21 tablet 6  . pioglitazone-metformin (ACTOPLUS MET) 15-500 MG tablet Take 1 tablet by mouth daily. 90 tablet 3   No current facility-administered medications for this visit.      OBJECTIVE: Morbidly obese white woman in no acute distress  Vitals:   03/06/19 1030  BP: (!) 141/89  Pulse: 75  Resp: 18  Temp: 98.3 F (36.8 C)  SpO2: 98%   Wt Readings from Last 3 Encounters:  03/06/19 248 lb 14.4 oz (112.9 kg)  01/18/19 249 lb (112.9 kg)  12/18/18 246 lb 6.4 oz (111.8 kg)   Body mass index is 42.72 kg/m.    ECOG FS:1 - Symptomatic but completely ambulatory  Ocular: Sclerae  unicteric, pupils round and equal Ear-nose-throat: Wearing a mask Lymphatic: No cervical or supraclavicular adenopathy Lungs no rales or rhonchi Heart regular rate and rhythm Abd soft, nontender, positive bowel sounds MSK no focal spinal tenderness, no joint edema Neuro: non-focal, well-oriented, appropriate affect Breasts:     LAB RESULTS:  CMP     Component Value Date/Time   NA 141 03/06/2019 1002   NA 140 05/29/2017 1254   K 4.3 03/06/2019 1002   K 4.4 05/29/2017 1254   CL 108 03/06/2019 1002   CO2 25 03/06/2019 1002   CO2 25 05/29/2017 1254   GLUCOSE 172 (H) 03/06/2019 1002   GLUCOSE 154 (H) 05/29/2017 1254   BUN 16 03/06/2019 1002   BUN 11.5 05/29/2017 1254   CREATININE 0.92 03/06/2019 1002   CREATININE 0.80 04/02/2018 1116   CREATININE 0.8 05/29/2017 1254   CALCIUM 8.9 03/06/2019 1002   CALCIUM 9.3 05/29/2017 1254   PROT 6.6 03/06/2019 1002   PROT 7.0 05/29/2017 1254   ALBUMIN 4.0 03/06/2019 1002   ALBUMIN 4.1 05/29/2017 1254   AST 12 (L) 03/06/2019 1002   AST 13 (L) 04/02/2018 1116   AST 13 05/29/2017 1254   ALT 12 03/06/2019 1002   ALT 15 04/02/2018 1116   ALT 14 05/29/2017 1254   ALKPHOS 50 03/06/2019 1002   ALKPHOS 66 05/29/2017 1254   BILITOT 0.5 03/06/2019 1002   BILITOT 0.4 04/02/2018 1116   BILITOT 0.50 05/29/2017 1254   GFRNONAA >60 03/06/2019 1002   GFRNONAA >60 04/02/2018 1116   GFRAA >60 03/06/2019 1002   GFRAA >60 04/02/2018 1116    INo results found for: SPEP, UPEP  Lab Results  Component Value Date   WBC 1.8 (L) 03/06/2019   NEUTROABS 1.0 (L) 03/06/2019   HGB 11.5 (L) 03/06/2019   HCT 33.5 (L) 03/06/2019   MCV 98.0 03/06/2019   PLT 157 03/06/2019      Chemistry      Component Value Date/Time   NA 141 03/06/2019 1002   NA 140 05/29/2017 1254   K 4.3 03/06/2019 1002   K 4.4 05/29/2017 1254   CL 108 03/06/2019 1002   CO2 25 03/06/2019 1002   CO2 25 05/29/2017 1254   BUN 16 03/06/2019 1002   BUN 11.5 05/29/2017 1254    CREATININE 0.92 03/06/2019 1002   CREATININE 0.80 04/02/2018 1116   CREATININE 0.8 05/29/2017 1254      Component Value Date/Time   CALCIUM 8.9 03/06/2019 1002   CALCIUM 9.3 05/29/2017 1254   ALKPHOS 50 03/06/2019 1002   ALKPHOS 66 05/29/2017 1254   AST 12 (L) 03/06/2019 1002   AST 13 (L) 04/02/2018 1116   AST 13 05/29/2017 1254   ALT 12 03/06/2019 1002   ALT 15 04/02/2018 1116   ALT 14 05/29/2017 1254   BILITOT 0.5 03/06/2019 1002   BILITOT 0.4 04/02/2018 1116   BILITOT 0.50 05/29/2017 1254       No results found for: LABCA2  No components found for: POEUM353  No results for input(s): INR in the last 168 hours.  Urinalysis    Component Value Date/Time   COLORURINE YELLOW 08/18/2017 1322   APPEARANCEUR HAZY (A) 08/18/2017 1322   LABSPEC 1.010 08/18/2017 1322   PHURINE 7.0 08/18/2017 1322   GLUCOSEU NEGATIVE 08/18/2017 1322   GLUCOSEU NEGATIVE 10/30/2014 1513   HGBUR LARGE (A) 08/18/2017 1322   BILIRUBINUR NEGATIVE 08/18/2017 1322   KETONESUR NEGATIVE 08/18/2017 1322   PROTEINUR 30 (A) 08/18/2017 1322   UROBILINOGEN 0.2 10/30/2014 1513   NITRITE NEGATIVE 08/18/2017 1322   LEUKOCYTESUR LARGE (A) 08/18/2017 1322    STUDIES: Nm Pet Image Restag (ps) Skull Base To Thigh  Result Date: 02/19/2019 CLINICAL DATA:  Subsequent treatment strategy for restaging of right-sided breast cancer. EXAM: NUCLEAR MEDICINE PET SKULL BASE  TO THIGH TECHNIQUE: 12.4 mCi F-18 FDG was injected intravenously. Full-ring PET imaging was performed from the skull base to thigh after the radiotracer. CT data was obtained and used for attenuation correction and anatomic localization. Fasting blood glucose: 170 mg/dl COMPARISON:  03/02/2018 FINDINGS: Mild limitations secondary to patient body habitus. Mediastinal blood pool activity: SUV max 3.9 Liver activity: SUV max NA NECK: No areas of abnormal hypermetabolism. Incidental CT findings: No cervical adenopathy. Surgical changes about both globes. CHEST:  No pulmonary parenchymal or thoracic nodal hypermetabolism. Incidental CT findings: No axillary adenopathy. Aortic and lad coronary artery calcification. Tiny hiatal hernia. ABDOMEN/PELVIS: No abdominopelvic parenchymal or nodal hypermetabolism. Incidental CT findings: Cholecystectomy. Intra and extrahepatic biliary duct dilatation again identified. In the porta hepatis, the common duct measures 1.9 cm on 100/4 versus 1.7 cm on the prior. 9 mm left adrenal nodule is not significantly changed. Abdominal aortic atherosclerosis. Scattered colonic diverticula. Degraded evaluation of the pelvis, secondary to beam hardening artifact from bilateral hip arthroplasty. Fat containing ventral abdominopelvic wall hernias again identified. SKELETON: Improvement in hypermetabolism about the right breast which is likely radiation induced. Persistent skin thickening. No abnormal marrow hypermetabolism. Incidental CT findings: Right breast surgical clips. IMPRESSION: 1. Radiation changes within the right breast, without findings of recurrent or metastatic disease. 2. Cholecystectomy with persistent biliary duct dilatation, possibly progressive. Correlate with bilirubin levels. 3. Incidental findings, including hiatal hernia, aortic and coronary artery atherosclerosis. Electronically Signed   By: Abigail Miyamoto M.D.   On: 02/19/2019 11:47    ELIGIBLE FOR AVAILABLE RESEARCH PROTOCOL: no  ASSESSMENT: 73 y.o. [place] woman with a remote history of early stage endometrial cancer, subsequently status post right breast upper outer quadrant biopsy 01/22/2016 for a clinically multifocal T2 N0, stage 2A invasive ductal carcinoma, grade 1, estrogen and progesterone receptor positive, HER-2 negative, with an MIB-1 between 10 and 15%.  (1) right axillary lymph node biopsy 03/02/2016 positive  (2) genetics testing 01/13/2016 through the Custom gene panel offered by [facility] found no deleterious mutations in  ATM, BARD1, BRCA1, BRCA2,  BRIP1, CDH1, CHEK2, EPCAM, FANCC, MLH1, MSH2, MSH6, MUTYH, NBN, PALB2, PMS2, POLD1, PTEN, RAD51C, RAD51D, TP53, and XRCC2  METASTATIC DISEASE: OCT 2017 (3) CT scans of the chest abdomen and pelvis obtained 03/10/2016 are consistent with bilateral lung metastases and mediastinal and hilar nodal involvement, but no liver or bone spread  (a) bronchoscopic lymph node biopsy 2 (station 7, 13R) 03/28/2016 confirms metastatic adenocarcinoma, estrogen receptor positive, HER-2 not amplified  (b) baseline CA-27-29 on 04/18/2016 was 137.5.  (4) letrozole started 03/15/2016, palbociclib added 03/29/2016 at 125 mg/day, 21/7  (a) dose decreased to 100 mg per day, 21/7, beginning with February cycle  (b) palbociclib held 01/31/2017, with increasing symptoms  (c) palbociclib resumed October 2018 at 75 mg daily  (d) palbociclib dose reduced to 75 mg every other day February through April 2019  (e) palbociclib dose resumed at 75 mg daily as of 10/17/2017  (5) status post double right lumpectomies and right axillary lymph node sampling 01/13/2017 for 2 separate invasive ductal carcinoma lesions, pT1a and pT1b, N1a, with negative margins, both lesions being estrogen and progesterone receptor positive and HER-2 negative  (6) adjuvant radiation completed 07/05/2017 1. 50.4 Gy in 28 fractions to the right breast and supraclavicular region using whole-breast tangent fields. 2. Boost to the seroma delivered an additional 10 Gy in 5 fractions. The total dose was 60.4 Gy.  (7) restaging studies:  (a) CT scan of the chest and bone  scan 06/23/2017 showed stable scattered very small lung nodules, no bone lesions  (b) CT of the chest 10/17/2017 showed no new or progressive metastatic disease in the chest. The small left lower lobe pulmonary nodule is stable  (c) PET scan on 03/02/2018: shows no findings for residual or recurrent right breast cancer   (8) right upper pole renal lesion noted to be enlarging on CT scan  06/22/2017  (a) no uptake on PET scan obtained 03/02/2018   PLAN: [name] is now 3 years out from definitive diagnosis of metastatic breast cancer.  Her PET scan could not be any better.  We discussed it at length today.  More specifically her disease is well controlled and she is tolerating her treatment well.  Her ANC is 1.0.  I think we need to follow this closely and consider dropping the palbociclib dose further.  Accordingly I am setting her up for monthly lab work.  We do have at least one other dose level we could go to if necessary.  I encouraged her to be as active as she can be.  She is doing a good job in terms of keeping herself safe from the current pandemic.  She will see me again in 3 months.  She knows to call for any other issues that may develop before that visit.    Caylin Nass, Virgie Dad, MD  03/06/19 6:36 PM Medical Oncology and Hematology [facility] [place], [place] [address] Tel. [phone]    Fax. [phone]  I, Jacqualyn Posey am acting as a Education administrator for Chauncey Cruel, MD.   I, Lurline Del MD, have reviewed the above documentation for accuracy and completeness, and I agree with the above.

## 2019-03-06 ENCOUNTER — Inpatient Hospital Stay (HOSPITAL_BASED_OUTPATIENT_CLINIC_OR_DEPARTMENT_OTHER): Payer: Medicare Other | Admitting: Oncology

## 2019-03-06 ENCOUNTER — Inpatient Hospital Stay: Payer: Medicare Other | Attending: Oncology

## 2019-03-06 ENCOUNTER — Other Ambulatory Visit: Payer: Self-pay

## 2019-03-06 VITALS — BP 141/89 | HR 75 | Temp 98.3°F | Resp 18 | Ht 64.0 in | Wt 248.9 lb

## 2019-03-06 DIAGNOSIS — Z170 Estrogen receptor positive status [ER+]: Secondary | ICD-10-CM

## 2019-03-06 DIAGNOSIS — Z801 Family history of malignant neoplasm of trachea, bronchus and lung: Secondary | ICD-10-CM | POA: Diagnosis not present

## 2019-03-06 DIAGNOSIS — K760 Fatty (change of) liver, not elsewhere classified: Secondary | ICD-10-CM

## 2019-03-06 DIAGNOSIS — Z90710 Acquired absence of both cervix and uterus: Secondary | ICD-10-CM | POA: Insufficient documentation

## 2019-03-06 DIAGNOSIS — I10 Essential (primary) hypertension: Secondary | ICD-10-CM | POA: Insufficient documentation

## 2019-03-06 DIAGNOSIS — Z8542 Personal history of malignant neoplasm of other parts of uterus: Secondary | ICD-10-CM

## 2019-03-06 DIAGNOSIS — C50411 Malignant neoplasm of upper-outer quadrant of right female breast: Secondary | ICD-10-CM

## 2019-03-06 DIAGNOSIS — C7801 Secondary malignant neoplasm of right lung: Secondary | ICD-10-CM

## 2019-03-06 DIAGNOSIS — E119 Type 2 diabetes mellitus without complications: Secondary | ICD-10-CM | POA: Insufficient documentation

## 2019-03-06 DIAGNOSIS — Z923 Personal history of irradiation: Secondary | ICD-10-CM | POA: Diagnosis not present

## 2019-03-06 DIAGNOSIS — Z7982 Long term (current) use of aspirin: Secondary | ICD-10-CM | POA: Insufficient documentation

## 2019-03-06 DIAGNOSIS — R918 Other nonspecific abnormal finding of lung field: Secondary | ICD-10-CM | POA: Diagnosis not present

## 2019-03-06 DIAGNOSIS — Z79899 Other long term (current) drug therapy: Secondary | ICD-10-CM | POA: Diagnosis not present

## 2019-03-06 DIAGNOSIS — Z79811 Long term (current) use of aromatase inhibitors: Secondary | ICD-10-CM | POA: Diagnosis not present

## 2019-03-06 DIAGNOSIS — Z7984 Long term (current) use of oral hypoglycemic drugs: Secondary | ICD-10-CM | POA: Insufficient documentation

## 2019-03-06 DIAGNOSIS — Z803 Family history of malignant neoplasm of breast: Secondary | ICD-10-CM | POA: Diagnosis not present

## 2019-03-06 DIAGNOSIS — Z86718 Personal history of other venous thrombosis and embolism: Secondary | ICD-10-CM | POA: Diagnosis not present

## 2019-03-06 LAB — CBC WITH DIFFERENTIAL/PLATELET
Abs Immature Granulocytes: 0.01 10*3/uL (ref 0.00–0.07)
Basophils Absolute: 0 10*3/uL (ref 0.0–0.1)
Basophils Relative: 1 %
Eosinophils Absolute: 0 10*3/uL (ref 0.0–0.5)
Eosinophils Relative: 1 %
HCT: 33.5 % — ABNORMAL LOW (ref 36.0–46.0)
Hemoglobin: 11.5 g/dL — ABNORMAL LOW (ref 12.0–15.0)
Immature Granulocytes: 1 %
Lymphocytes Relative: 30 %
Lymphs Abs: 0.5 10*3/uL — ABNORMAL LOW (ref 0.7–4.0)
MCH: 33.6 pg (ref 26.0–34.0)
MCHC: 34.3 g/dL (ref 30.0–36.0)
MCV: 98 fL (ref 80.0–100.0)
Monocytes Absolute: 0.2 10*3/uL (ref 0.1–1.0)
Monocytes Relative: 9 %
Neutro Abs: 1 10*3/uL — ABNORMAL LOW (ref 1.7–7.7)
Neutrophils Relative %: 58 %
Platelets: 157 10*3/uL (ref 150–400)
RBC: 3.42 MIL/uL — ABNORMAL LOW (ref 3.87–5.11)
RDW: 14 % (ref 11.5–15.5)
WBC: 1.8 10*3/uL — ABNORMAL LOW (ref 4.0–10.5)
nRBC: 0 % (ref 0.0–0.2)

## 2019-03-06 LAB — COMPREHENSIVE METABOLIC PANEL
ALT: 12 U/L (ref 0–44)
AST: 12 U/L — ABNORMAL LOW (ref 15–41)
Albumin: 4 g/dL (ref 3.5–5.0)
Alkaline Phosphatase: 50 U/L (ref 38–126)
Anion gap: 8 (ref 5–15)
BUN: 16 mg/dL (ref 8–23)
CO2: 25 mmol/L (ref 22–32)
Calcium: 8.9 mg/dL (ref 8.9–10.3)
Chloride: 108 mmol/L (ref 98–111)
Creatinine, Ser: 0.92 mg/dL (ref 0.44–1.00)
GFR calc Af Amer: >60 mL/min (ref >60)
GFR calc non Af Amer: >60 mL/min (ref >60)
Glucose, Bld: 172 mg/dL — ABNORMAL HIGH (ref 70–99)
Potassium: 4.3 mmol/L (ref 3.5–5.1)
Sodium: 141 mmol/L (ref 135–145)
Total Bilirubin: 0.5 mg/dL (ref 0.3–1.2)
Total Protein: 6.6 g/dL (ref 6.5–8.1)

## 2019-03-07 ENCOUNTER — Telehealth: Payer: Self-pay | Admitting: Oncology

## 2019-03-07 LAB — CANCER ANTIGEN 27.29: CA 27.29: 43.3 U/mL — ABNORMAL HIGH (ref 0.0–38.6)

## 2019-03-07 NOTE — Telephone Encounter (Signed)
I talk with patient she will look at my chart

## 2019-03-13 ENCOUNTER — Other Ambulatory Visit (HOSPITAL_COMMUNITY): Payer: Medicare Other

## 2019-03-20 ENCOUNTER — Other Ambulatory Visit: Payer: Self-pay

## 2019-03-20 DIAGNOSIS — Z20822 Contact with and (suspected) exposure to covid-19: Secondary | ICD-10-CM

## 2019-03-20 DIAGNOSIS — Z20828 Contact with and (suspected) exposure to other viral communicable diseases: Secondary | ICD-10-CM | POA: Diagnosis not present

## 2019-03-22 LAB — NOVEL CORONAVIRUS, NAA: SARS-CoV-2, NAA: NOT DETECTED

## 2019-03-25 ENCOUNTER — Telehealth: Payer: Self-pay | Admitting: Internal Medicine

## 2019-03-25 MED ORDER — PIOGLITAZONE HCL-METFORMIN HCL 15-500 MG PO TABS: 1.0000 | ORAL_TABLET | Freq: Every day | ORAL | 3 refills | Status: DC

## 2019-03-25 NOTE — Telephone Encounter (Signed)
Patient states she saw on the news pioglitazone-metformin (ACTOPLUS MET) 15-500 MG tablet causes bladder infection, please advise

## 2019-03-26 ENCOUNTER — Telehealth: Payer: Self-pay | Admitting: Internal Medicine

## 2019-03-26 NOTE — Telephone Encounter (Signed)
Pt has been informed and expressed understanding.  

## 2019-03-26 NOTE — Telephone Encounter (Signed)
Pt calling again to find out why she hasn't gotten a call back about this medication. Pt states this is her third call in regards to this - she is very upset.

## 2019-03-26 NOTE — Telephone Encounter (Signed)
AccessNurse Call: 03/23/2019 at 9:09am -   Patient called stating that she saw on the news that diabetes medication can cause cancer. She said that she is taking one of the medications that was listed. She mentioned that she has been battling cancer and would like to speak to someone about it.  Please advise.

## 2019-03-26 NOTE — Telephone Encounter (Signed)
It is my understanding that the cancer related medication that was recalled was metformin ER.  This is different from the actosplusmet.  Please have pt ask her pharmacist about this, but I dont think her medication has been recalled or needs to be changed

## 2019-03-26 NOTE — Telephone Encounter (Signed)
I followed up with pt. Informed her that the original message was sent to PCP and he has not had a chance to respond yet. Once I have some additional information in regard to the pioglitazone-metformin medication I would follow up with her. She expressed understanding.

## 2019-03-27 MED ORDER — METFORMIN HCL 500 MG PO TABS: 500.0000 mg | ORAL_TABLET | Freq: Two times a day (BID) | ORAL | 3 refills | Status: DC

## 2019-03-27 NOTE — Telephone Encounter (Signed)
Patient called in again in regards to this medication and she is wanting an alternative still. Went over Dr.John's note with her and she stated the medication is not recalled but banned everywhere if you take it for over a year it can cause bladder cancer. Patient is wanting clarification and a new medication. Please advise.

## 2019-03-27 NOTE — Addendum Note (Signed)
Addended by: Biagio Borg on: 03/27/2019 12:28 PM   Modules accepted: Orders

## 2019-03-27 NOTE — Telephone Encounter (Signed)
No, this medication has been banned in the [place] market (as is proven by the fact she has this in hand)  The link to bladder cancer is not proven, despite what the lawyer ads say on TV  We can change if she likes however, to metformin 500 mg twice per day - done erx

## 2019-03-27 NOTE — Telephone Encounter (Signed)
Pt has been informed and expressed understanding.  

## 2019-04-09 ENCOUNTER — Other Ambulatory Visit: Payer: Self-pay

## 2019-04-09 ENCOUNTER — Inpatient Hospital Stay: Payer: Medicare Other | Attending: Oncology

## 2019-04-09 DIAGNOSIS — Z79811 Long term (current) use of aromatase inhibitors: Secondary | ICD-10-CM | POA: Diagnosis not present

## 2019-04-09 DIAGNOSIS — C7800 Secondary malignant neoplasm of unspecified lung: Secondary | ICD-10-CM | POA: Diagnosis not present

## 2019-04-09 DIAGNOSIS — Z8542 Personal history of malignant neoplasm of other parts of uterus: Secondary | ICD-10-CM

## 2019-04-09 DIAGNOSIS — Z170 Estrogen receptor positive status [ER+]: Secondary | ICD-10-CM | POA: Insufficient documentation

## 2019-04-09 DIAGNOSIS — C50411 Malignant neoplasm of upper-outer quadrant of right female breast: Secondary | ICD-10-CM

## 2019-04-09 DIAGNOSIS — Z923 Personal history of irradiation: Secondary | ICD-10-CM | POA: Diagnosis not present

## 2019-04-09 DIAGNOSIS — K760 Fatty (change of) liver, not elsewhere classified: Secondary | ICD-10-CM

## 2019-04-09 DIAGNOSIS — C7801 Secondary malignant neoplasm of right lung: Secondary | ICD-10-CM

## 2019-04-09 LAB — COMPREHENSIVE METABOLIC PANEL
ALT: 13 U/L (ref 0–44)
AST: 11 U/L — ABNORMAL LOW (ref 15–41)
Albumin: 3.9 g/dL (ref 3.5–5.0)
Alkaline Phosphatase: 53 U/L (ref 38–126)
Anion gap: 10 (ref 5–15)
BUN: 14 mg/dL (ref 8–23)
CO2: 24 mmol/L (ref 22–32)
Calcium: 9.1 mg/dL (ref 8.9–10.3)
Chloride: 107 mmol/L (ref 98–111)
Creatinine, Ser: 0.84 mg/dL (ref 0.44–1.00)
GFR calc Af Amer: >60 mL/min (ref >60)
GFR calc non Af Amer: >60 mL/min (ref >60)
Glucose, Bld: 174 mg/dL — ABNORMAL HIGH (ref 70–99)
Potassium: 4.3 mmol/L (ref 3.5–5.1)
Sodium: 141 mmol/L (ref 135–145)
Total Bilirubin: 0.4 mg/dL (ref 0.3–1.2)
Total Protein: 6.7 g/dL (ref 6.5–8.1)

## 2019-04-09 MED FILL — IBRANCE 75 MG TABS: 75 | 28 days supply | Qty: 21 | Fill #3

## 2019-04-10 LAB — CANCER ANTIGEN 27.29: CA 27.29: 32.6 U/mL (ref 0.0–38.6)

## 2019-04-12 ENCOUNTER — Telehealth: Payer: Self-pay

## 2019-04-12 ENCOUNTER — Inpatient Hospital Stay: Payer: Medicare Other

## 2019-04-12 ENCOUNTER — Other Ambulatory Visit: Payer: Self-pay

## 2019-04-12 DIAGNOSIS — Z170 Estrogen receptor positive status [ER+]: Secondary | ICD-10-CM

## 2019-04-12 DIAGNOSIS — C50411 Malignant neoplasm of upper-outer quadrant of right female breast: Secondary | ICD-10-CM

## 2019-04-12 DIAGNOSIS — C7800 Secondary malignant neoplasm of unspecified lung: Secondary | ICD-10-CM | POA: Diagnosis not present

## 2019-04-12 DIAGNOSIS — Z79811 Long term (current) use of aromatase inhibitors: Secondary | ICD-10-CM | POA: Diagnosis not present

## 2019-04-12 DIAGNOSIS — Z923 Personal history of irradiation: Secondary | ICD-10-CM | POA: Diagnosis not present

## 2019-04-12 LAB — CBC WITH DIFFERENTIAL (CANCER CENTER ONLY)
Abs Immature Granulocytes: 0.01 10*3/uL (ref 0.00–0.07)
Basophils Absolute: 0 10*3/uL (ref 0.0–0.1)
Basophils Relative: 1 %
Eosinophils Absolute: 0 10*3/uL (ref 0.0–0.5)
Eosinophils Relative: 1 %
HCT: 37.3 % (ref 36.0–46.0)
Hemoglobin: 12.7 g/dL (ref 12.0–15.0)
Immature Granulocytes: 0 %
Lymphocytes Relative: 33 %
Lymphs Abs: 0.8 10*3/uL (ref 0.7–4.0)
MCH: 33.2 pg (ref 26.0–34.0)
MCHC: 34 g/dL (ref 30.0–36.0)
MCV: 97.4 fL (ref 80.0–100.0)
Monocytes Absolute: 0.4 10*3/uL (ref 0.1–1.0)
Monocytes Relative: 17 %
Neutro Abs: 1.2 10*3/uL — ABNORMAL LOW (ref 1.7–7.7)
Neutrophils Relative %: 48 %
Platelet Count: 157 10*3/uL (ref 150–400)
RBC: 3.83 MIL/uL — ABNORMAL LOW (ref 3.87–5.11)
RDW: 14.8 % (ref 11.5–15.5)
WBC Count: 2.5 10*3/uL — ABNORMAL LOW (ref 4.0–10.5)
nRBC: 0 % (ref 0.0–0.2)

## 2019-04-12 NOTE — Telephone Encounter (Signed)
Pt called requesting results from lab work on 10/27, to follow up on referral to PCP, and ask about discontinuing some of her medications.  Labs reviewed with pt.  Pt states she should have had a CBC drawn also. Pt states she has been waiting 3 weeks for referral to a PCP. This RN advised pt she should not discontinue any of her medications without consulting with the prescribing physician first.  Pt agrees.  Above will be reviewed with MD upon return on 04/15/19

## 2019-04-15 ENCOUNTER — Telehealth: Payer: Self-pay

## 2019-04-15 NOTE — Telephone Encounter (Signed)
Oral Oncology Patient Advocate Encounter  The patient called me to inform me that she received a text from [employer] to re-enroll for her grant. The grant will expire 06/13/19.  The patient did the re-enrollment online and will receive a letter in the mail from [employer].  The reference number for her re-enrollment is [id number], she is on the purple plan and her PIN # is [id number].  The patient will call and let us know if she was approved or not when she receives the letter from [employer] in the mail.  Oral Oncology clinic will continue to follow.  [place] Patient [place] Phone [phone] Fax [phone] 04/15/2019   1:58 PM

## 2019-05-06 ENCOUNTER — Telehealth: Payer: Self-pay | Admitting: Pharmacist

## 2019-05-06 MED FILL — IBRANCE 75 MG TABS: 75 | 28 days supply | Qty: 21 | Fill #4

## 2019-05-06 NOTE — Telephone Encounter (Signed)
Oral Oncology Pharmacist Encounter  Received call from patient with information that she has been successfully reenrolled to receive foundation grant copayment coverage for [name] (palbociclib) from [employer] for calendar year 2021.  Patient will bring the updated foundation grant information to the [employer] today or tomorrow.  I have contacted the pharmacy and alerted them the patient will be coming by with this information.  Johny Drilling, PharmD, BCPS, BCOP  05/06/2019 9:27 AM Oral Oncology Clinic [phone]

## 2019-05-07 ENCOUNTER — Other Ambulatory Visit: Payer: Self-pay | Admitting: *Deleted

## 2019-05-07 ENCOUNTER — Other Ambulatory Visit: Payer: Self-pay

## 2019-05-07 ENCOUNTER — Inpatient Hospital Stay: Payer: Medicare Other | Attending: Oncology

## 2019-05-07 DIAGNOSIS — Z8542 Personal history of malignant neoplasm of other parts of uterus: Secondary | ICD-10-CM

## 2019-05-07 DIAGNOSIS — K760 Fatty (change of) liver, not elsewhere classified: Secondary | ICD-10-CM

## 2019-05-07 DIAGNOSIS — C50411 Malignant neoplasm of upper-outer quadrant of right female breast: Secondary | ICD-10-CM

## 2019-05-07 DIAGNOSIS — C7800 Secondary malignant neoplasm of unspecified lung: Secondary | ICD-10-CM | POA: Diagnosis not present

## 2019-05-07 DIAGNOSIS — Z170 Estrogen receptor positive status [ER+]: Secondary | ICD-10-CM

## 2019-05-07 DIAGNOSIS — Z79811 Long term (current) use of aromatase inhibitors: Secondary | ICD-10-CM | POA: Diagnosis not present

## 2019-05-07 DIAGNOSIS — Z923 Personal history of irradiation: Secondary | ICD-10-CM | POA: Diagnosis not present

## 2019-05-07 DIAGNOSIS — C7801 Secondary malignant neoplasm of right lung: Secondary | ICD-10-CM

## 2019-05-07 LAB — CBC WITH DIFFERENTIAL (CANCER CENTER ONLY)
Abs Immature Granulocytes: 0.01 10*3/uL (ref 0.00–0.07)
Basophils Absolute: 0 10*3/uL (ref 0.0–0.1)
Basophils Relative: 1 %
Eosinophils Absolute: 0 10*3/uL (ref 0.0–0.5)
Eosinophils Relative: 1 %
HCT: 32.7 % — ABNORMAL LOW (ref 36.0–46.0)
Hemoglobin: 11.1 g/dL — ABNORMAL LOW (ref 12.0–15.0)
Immature Granulocytes: 1 %
Lymphocytes Relative: 34 %
Lymphs Abs: 0.7 10*3/uL (ref 0.7–4.0)
MCH: 33.5 pg (ref 26.0–34.0)
MCHC: 33.9 g/dL (ref 30.0–36.0)
MCV: 98.8 fL (ref 80.0–100.0)
Monocytes Absolute: 0.3 10*3/uL (ref 0.1–1.0)
Monocytes Relative: 16 %
Neutro Abs: 0.9 10*3/uL — ABNORMAL LOW (ref 1.7–7.7)
Neutrophils Relative %: 47 %
Platelet Count: 136 10*3/uL — ABNORMAL LOW (ref 150–400)
RBC: 3.31 MIL/uL — ABNORMAL LOW (ref 3.87–5.11)
RDW: 14.9 % (ref 11.5–15.5)
WBC Count: 1.9 10*3/uL — ABNORMAL LOW (ref 4.0–10.5)
nRBC: 0 % (ref 0.0–0.2)

## 2019-05-07 LAB — COMPREHENSIVE METABOLIC PANEL
ALT: 15 U/L (ref 0–44)
AST: 12 U/L — ABNORMAL LOW (ref 15–41)
Albumin: 3.9 g/dL (ref 3.5–5.0)
Alkaline Phosphatase: 49 U/L (ref 38–126)
Anion gap: 9 (ref 5–15)
BUN: 14 mg/dL (ref 8–23)
CO2: 26 mmol/L (ref 22–32)
Calcium: 8.8 mg/dL — ABNORMAL LOW (ref 8.9–10.3)
Chloride: 105 mmol/L (ref 98–111)
Creatinine, Ser: 0.83 mg/dL (ref 0.44–1.00)
GFR calc Af Amer: >60 mL/min (ref >60)
GFR calc non Af Amer: >60 mL/min (ref >60)
Glucose, Bld: 210 mg/dL — ABNORMAL HIGH (ref 70–99)
Potassium: 4.1 mmol/L (ref 3.5–5.1)
Sodium: 140 mmol/L (ref 135–145)
Total Bilirubin: 0.5 mg/dL (ref 0.3–1.2)
Total Protein: 6.6 g/dL (ref 6.5–8.1)

## 2019-05-09 LAB — CANCER ANTIGEN 27.29: CA 27.29: 28.4 U/mL (ref 0.0–38.6)

## 2019-05-29 DIAGNOSIS — E119 Type 2 diabetes mellitus without complications: Secondary | ICD-10-CM | POA: Diagnosis not present

## 2019-05-29 DIAGNOSIS — H401134 Primary open-angle glaucoma, bilateral, indeterminate stage: Secondary | ICD-10-CM | POA: Diagnosis not present

## 2019-05-29 DIAGNOSIS — Z961 Presence of intraocular lens: Secondary | ICD-10-CM | POA: Diagnosis not present

## 2019-06-03 MED FILL — IBRANCE 75 MG TABS: 75 | 28 days supply | Qty: 21 | Fill #5

## 2019-06-03 NOTE — Progress Notes (Signed)
[name]  Telephone:([phone]) [phone] Fax:(336) [phone]     ID: ENNIS DELPOZO DOB: 08/03/1945  MR#: [id number]  KPT#:[id number]  Patient Care Team: Biagio Borg, MD as PCP - Stevan Born, MD as Consulting Physician (General Surgery) Magrinat, Virgie Dad, MD as Consulting Physician (Oncology) Kyung Rudd, MD as Consulting Physician (Radiation Oncology) Bobbye Charleston, MD as Consulting Physician (Obstetrics and Gynecology) Nada Libman, MD as Referring Physician (Specialist) Vevelyn Royals, MD as Consulting Physician (Ophthalmology) Lamonte Sakai Rose Fillers, MD as Consulting Physician (Pulmonary Disease) Alexis Frock, MD as Consulting Physician (Urology) Ander Slade, Carlisle Beers, MD as Referring Physician (Ophthalmology) OTHER MD:   CHIEF COMPLAINT: Estrogen receptor positive breast cancer  CURRENT TREATMENT: Letrozole, palbociclib   INTERVAL HISTORY: [name] returns today for follow-up and treatment of her estrogen receptor positive stage IV breast cancer.  [name] is not with her today.  She continues on letrozole.  Hot flushes have never been a problem.  The main issue she is having is hair loss which may be due to the letrozole or may be due to the palbociclib.  She does very well with the palbociclib also.  She really has no nausea.  She has some fatigue but it is not clear if that is related to that medication.  Thre is no bone density screening on file.   Since her last visit here, she underwent coronavirus testing on 03/20/2019, which was negative.   REVIEW OF SYSTEMS: [name] remains normally active.  She gets up around 7 in the morning watches an hour of TV, has breakfast, takes a shower, and then usually she goes out with her husband and they do errands until the middle of the afternoon.  She is not exactly exercising regularly but she stays on her feet she says.  She does housework.  She has had no unusual headaches visual changes cough  phlegm production or pleurisy.  She is planning to change primary care physicians she is status.  Again the main thing that bothers her is the hearing loss.  She says she cannot cook anymore because her hair was all over the food.  Her husband does the cooking.  A detailed review of systems today was otherwise stable.   BREAST CANCER HISTORY: From the original intake note:  Anisa had screening mammography showing some suspicious calcifications in the right breast leading to right diagnostic mammography with ultrasonography 01/22/2016 at [facility]. The breast density was category C. In the upper right breast there was a 2.3 cm mass with additional masses measuring 0.9 and 0.7 cm. There was also a possible additional 0.8 mass in the lower inner quadrant. Ultrasound confirmed an irregular hypoechoic mass in the right breast upper outer quadrant measuring 2.0 cm. There were other masses measuring 0.7 and 0.8 cm by ultrasonography. The right axilla was sonographically benign.  Biopsy of a 12:00 and 4:00 mass in the right breast 01/22/2016 showed ([id number]) both specimens showing invasive ductal carcinoma, grade 1 or 2, both 95% estrogen receptor positive, both 95% progesterone receptor positive, both with strong staining intensity, with MIB-1 ranging from 10-15%, and both HER-2 negative, the signals ratio being 1.23-1.42, and the number per cell 1.85-2.59.  Her subsequent history is as detailed below   PAST MEDICAL HISTORY: Past Medical History:  Diagnosis Date  . Breast cancer ([place])   . Cancer ([place]) 02/2016   right breast  . DIABETES MELLITUS, TYPE II 01/04/2007   only takes actoplus daily  . Dizziness and giddiness  02/29/2008  . DVT, HX OF    at age 25 in right buttocks  . Dyspnea    due to lung cancer  . Family history of breast cancer   . GERD 01/04/2007   pt reports resolved   . GLAUCOMA 07/30/2008   both eyes  . History of blood transfusion    no abnormal  reaction  . History of  uterine cancer 2000   hysterectomy done  . HYPERLIPIDEMIA 01/04/2007   taking Pravastatin daily  . HYPERTENSION 01/04/2007   takes Lisinopril daily  . Joint pain   . Joint swelling   . Leg cramps   . LEG PAIN, LEFT 07/06/2007  . NUMBNESS 07/30/2008   in fingers;pt states from Diamox  . OSTEOARTHRITIS, HIP 09/25/2009  . OTITIS MEDIA, ACUTE, BILATERAL 02/29/2008  . Overweight(278.02) 01/04/2007  . Peripheral vascular disease ([place])   . Personal history of radiation therapy 2018  . Pneumonia   . PONV (postoperative nausea and vomiting)   . SLEEP APNEA, OBSTRUCTIVE    doesn't use a cpap;study done about 13yr ago  . TRANSIENT ISCHEMIC ATTACK, HX OF 01/04/2007  . Vision loss    left eye    PAST SURGICAL HISTORY: Past Surgical History:  Procedure Laterality Date  . ABDOMINAL HYSTERECTOMY  2000  . BREAST LUMPECTOMY Right 01/13/2017   x2  . BREAST LUMPECTOMY WITH RADIOACTIVE SEED AND SENTINEL LYMPH NODE BIOPSY Right 01/13/2017   Procedure: RIGHT BREAST RADIOACTIVE SEED X'S 2 GUIDED LUMPECTOMY WITH RADIOACTIVE SEED TARGETED AXILLARYLYMPH NODE EXCISION AND RIGHT AXILLARY SENTINEL LYMPH NODE BIOPSY;  Surgeon: NAlphonsa Overall MD;  Location: [facility]  Service: General;  Laterality: Right;  2 SEEDS IN RIGHT BREAST 1 SEED IN RIGHT AXILLARY NODE  . CHOLECYSTECTOMY    . ENDOBRONCHIAL ULTRASOUND Bilateral 03/28/2016   Procedure: ENDOBRONCHIAL ULTRASOUND;  Surgeon: RCollene Gobble MD;  Location: [facility];  Service: Cardiopulmonary;  Laterality: Bilateral;  . EYE SURGERY  13   shunt left and lazer eye surgery on right cataract and retenia tear with repair  . growth removal  2004   from thumb  . KNEE ARTHROSCOPY Right   . mulitple eye surgeries     both eyes, cataracts with ioc done both eyes  . OOPHORECTOMY    . right lumpectomy with axillary node dissection Right 01/2017  . TOTAL HIP ARTHROPLASTY  06/24/2011   Procedure: TOTAL HIP ARTHROPLASTY;  Surgeon: FKerin Salen  [facility]  Service:  Orthopedics;  Laterality: Right;  . TOTAL HIP ARTHROPLASTY Left 11/12/2012   Dr RMayer Camel . TOTAL HIP ARTHROPLASTY Left 11/12/2012   Procedure: TOTAL HIP ARTHROPLASTY;  Surgeon: FKerin Salen MD;  Location[facility]  Service: Orthopedics;  Laterality: Left;  DEPUY PINNACLE    FAMILY HISTORY Family History  Problem Relation Age of Onset  . Dementia Mother   . Cancer Mother        Breast and lung cancer  . Stroke Sister   . Breast cancer Sister 540 . Heart attack Maternal Aunt   . Lung cancer Maternal Grandmother        non smoker  . Glaucoma Maternal Grandfather   . Anesthesia problems Neg Hx    The patient's father died at age 73 the patient's mother died at age 73 She had breast and lung cancers diagnosed shortly before her death. The patient had no brothers, 2 sisters. One sister was diagnosed with breast cancer at the age of 529   GYNECOLOGIC HISTORY:  No LMP recorded. Patient has had a hysterectomy. Menarche age 70, first live birth age 57, the patient is GX P1. She had a hysterectomy for endometrial cancer in the year 2000. She did not take hormone replacement. She did use oral contraceptives for more than 20 years remotely, with no complications.   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 She is home with [name]. He is a retired Dealer.Their son [name] also lives in [place].    ADVANCED DIRECTIVES: In place   HEALTH MAINTENANCE: Social History   Tobacco Use  . Smoking status: Never Smoker  . Smokeless tobacco: Never Used  Substance Use Topics  . Alcohol use: No  . Drug use: No     Colonoscopy: Never  PAP: Status post hysterectomy  Bone density: Remote   Allergies  Allergen Reactions  . Codeine Hives    Hycodan syrup  . Fluorescein Nausea And Vomiting    ? IV dye for retina specialist  . Lipitor [Atorvastatin Calcium]     Leg cramp  . Oxycodone Nausea And  Vomiting    Patient vomited for 3 days after taking  . Sitagliptin Phosphate Nausea And Vomiting  . Sulfa Drugs Cross Reactors Nausea And Vomiting    Current Outpatient Medications  Medication Sig Dispense Refill  . acetaminophen (TYLENOL) 500 MG tablet Take 1,000 mg by mouth every 4 (four) hours as needed for moderate pain or fever.    Marland Kitchen amLODipine (NORVASC) 5 MG tablet Take 1 tablet (5 mg total) by mouth daily. 90 tablet 3  . aspirin EC 81 MG tablet Take 81 mg by mouth daily at 6 PM. 1700    . bimatoprost (LUMIGAN) 0.01 % SOLN Place 1 drop into both eyes at bedtime.    . brimonidine-timolol (COMBIGAN) 0.2-0.5 % ophthalmic solution Place 1 drop into both eyes three times daily    . cholecalciferol (VITAMIN D) 1000 units tablet Take 1,000 Units by mouth daily.    . dorzolamide-timolol (COSOPT) 22.3-6.8 MG/ML ophthalmic solution Place 1 drop into both eyes 2 (two) times daily.    . hydrochlorothiazide (MICROZIDE) 12.5 MG capsule Take 1 capsule (12.5 mg total) by mouth daily. 90 capsule 3  . letrozole (FEMARA) 2.5 MG tablet Take 1 tablet (2.5 mg total) by mouth at bedtime. 90 tablet 4  . lisinopril (PRINIVIL,ZESTRIL) 40 MG tablet Take 1 tablet (40 mg total) by mouth daily. 90 tablet 3  . lovastatin (MEVACOR) 20 MG tablet Take 1 tablet (20 mg total) by mouth every evening. 90 tablet 3  . metFORMIN (GLUCOPHAGE) 500 MG tablet Take 1 tablet (500 mg total) by mouth 2 (two) times daily with a meal. 180 tablet 3  . palbociclib (IBRANCE) 75 MG tablet Take 1 tablet (75 mg total) by mouth daily. Take for 21 days on, 7 days off, repeat every 28 days. 21 tablet 6   No current facility-administered medications for this visit.     OBJECTIVE: Morbidly obese white woman who appears stated age  41:   06/04/19 0918  BP: (!) 154/71  Pulse: 87  Resp: 18  Temp: 98 F (36.7 C)  SpO2: 100%   Wt Readings from Last 3 Encounters:  06/04/19 251 lb 4.8 oz (114 kg)  03/06/19 248 lb 14.4 oz (112.9 kg)    01/18/19 249 lb (112.9 kg)   Body mass index is 43.14 kg/m.    ECOG FS:1 -  Symptomatic but completely ambulatory  Sclerae unicteric, EOMs intact Wearing a mask No cervical or supraclavicular adenopathy Lungs no rales or rhonchi Heart regular rate and rhythm Abd soft, nontender, positive bowel sounds MSK no focal spinal tenderness, no upper extremity lymphedema Neuro: nonfocal, well oriented, appropriate affect Breasts: The right breast is status post lumpectomy and radiation.  There is mild distortion of the breast contour.  She has a little bit of discomfort in the right axilla on palpation but there is no palpable mass.  Left breast is benign.  Left axilla is benign.   LAB RESULTS:  CMP     Component Value Date/Time   NA 141 06/04/2019 0901   NA 140 05/29/2017 1254   K 4.3 06/04/2019 0901   K 4.4 05/29/2017 1254   CL 105 06/04/2019 0901   CO2 26 06/04/2019 0901   CO2 25 05/29/2017 1254   GLUCOSE 249 (H) 06/04/2019 0901   GLUCOSE 154 (H) 05/29/2017 1254   BUN 15 06/04/2019 0901   BUN 11.5 05/29/2017 1254   CREATININE 0.90 06/04/2019 0901   CREATININE 0.80 04/02/2018 1116   CREATININE 0.8 05/29/2017 1254   CALCIUM 8.9 06/04/2019 0901   CALCIUM 9.3 05/29/2017 1254   PROT 6.9 06/04/2019 0901   PROT 7.0 05/29/2017 1254   ALBUMIN 3.9 06/04/2019 0901   ALBUMIN 4.1 05/29/2017 1254   AST 13 (L) 06/04/2019 0901   AST 13 (L) 04/02/2018 1116   AST 13 05/29/2017 1254   ALT 14 06/04/2019 0901   ALT 15 04/02/2018 1116   ALT 14 05/29/2017 1254   ALKPHOS 54 06/04/2019 0901   ALKPHOS 66 05/29/2017 1254   BILITOT 0.4 06/04/2019 0901   BILITOT 0.4 04/02/2018 1116   BILITOT 0.50 05/29/2017 1254   GFRNONAA >60 06/04/2019 0901   GFRNONAA >60 04/02/2018 1116   GFRAA >60 06/04/2019 0901   GFRAA >60 04/02/2018 1116    INo results found for: SPEP, UPEP  Lab Results  Component Value Date   WBC 1.9 (L) 05/07/2019   NEUTROABS 0.9 (L) 05/07/2019   HGB 11.1 (L) 05/07/2019   HCT  32.7 (L) 05/07/2019   MCV 98.8 05/07/2019   PLT 136 (L) 05/07/2019      Chemistry      Component Value Date/Time   NA 141 06/04/2019 0901   NA 140 05/29/2017 1254   K 4.3 06/04/2019 0901   K 4.4 05/29/2017 1254   CL 105 06/04/2019 0901   CO2 26 06/04/2019 0901   CO2 25 05/29/2017 1254   BUN 15 06/04/2019 0901   BUN 11.5 05/29/2017 1254   CREATININE 0.90 06/04/2019 0901   CREATININE 0.80 04/02/2018 1116   CREATININE 0.8 05/29/2017 1254      Component Value Date/Time   CALCIUM 8.9 06/04/2019 0901   CALCIUM 9.3 05/29/2017 1254   ALKPHOS 54 06/04/2019 0901   ALKPHOS 66 05/29/2017 1254   AST 13 (L) 06/04/2019 0901   AST 13 (L) 04/02/2018 1116   AST 13 05/29/2017 1254   ALT 14 06/04/2019 0901   ALT 15 04/02/2018 1116   ALT 14 05/29/2017 1254   BILITOT 0.4 06/04/2019 0901   BILITOT 0.4 04/02/2018 1116   BILITOT 0.50 05/29/2017 1254       No results found for: LABCA2  No components found for: LABCA125  No results for input(s): INR in the last 168 hours.  Urinalysis    Component Value Date/Time   COLORURINE YELLOW 08/18/2017 1322   APPEARANCEUR HAZY (A) 08/18/2017 1322  LABSPEC 1.010 08/18/2017 1322   PHURINE 7.0 08/18/2017 1322   GLUCOSEU NEGATIVE 08/18/2017 1322   GLUCOSEU NEGATIVE 10/30/2014 1513   HGBUR LARGE (A) 08/18/2017 1322   BILIRUBINUR NEGATIVE 08/18/2017 1322   KETONESUR NEGATIVE 08/18/2017 1322   PROTEINUR 30 (A) 08/18/2017 1322   UROBILINOGEN 0.2 10/30/2014 1513   NITRITE NEGATIVE 08/18/2017 1322   LEUKOCYTESUR LARGE (A) 08/18/2017 1322    STUDIES: No results found.    ELIGIBLE FOR AVAILABLE RESEARCH PROTOCOL: no  ASSESSMENT: 73 y.o. [place] woman with a remote history of early stage endometrial cancer, subsequently status post right breast upper outer quadrant biopsy 01/22/2016 for a clinically multifocal T2 N0, stage 2A invasive ductal carcinoma, grade 1, estrogen and progesterone receptor positive, HER-2 negative, with an MIB-1  between 10 and 15%.  (1) right axillary lymph node biopsy 03/02/2016 positive  (2) genetics testing 01/13/2016 through the Custom gene panel offered by [facility] found no deleterious mutations in  ATM, BARD1, BRCA1, BRCA2, BRIP1, CDH1, CHEK2, EPCAM, FANCC, MLH1, MSH2, MSH6, MUTYH, NBN, PALB2, PMS2, POLD1, PTEN, RAD51C, RAD51D, TP53, and XRCC2  METASTATIC DISEASE: OCT 2017 (3) CT scans of the chest abdomen and pelvis obtained 03/10/2016 are consistent with bilateral lung metastases and mediastinal and hilar nodal involvement, but no liver or bone spread  (a) bronchoscopic lymph node biopsy 2 (station 7, 13R) 03/28/2016 confirms metastatic adenocarcinoma, estrogen receptor positive, HER-2 not amplified  (b) baseline CA-27-29 on 04/18/2016 was 137.5.  (4) letrozole started 03/15/2016, palbociclib added 03/29/2016 at 125 mg/day, 21/7  (a) dose decreased to 100 mg per day, 21/7, beginning with February cycle  (b) palbociclib held 01/31/2017, with increasing symptoms  (c) palbociclib resumed October 2018 at 75 mg daily  (d) palbociclib dose reduced to 75 mg every other day February through April 2019  (e) palbociclib dose resumed at 75 mg daily as of 10/17/2017  (5) status post double right lumpectomies and right axillary lymph node sampling 01/13/2017 for 2 separate invasive ductal carcinoma lesions, pT1a and pT1b, N1a, with negative margins, both lesions being estrogen and progesterone receptor positive and HER-2 negative  (6) adjuvant radiation completed 07/05/2017 1. 50.4 Gy in 28 fractions to the right breast and supraclavicular region using whole-breast tangent fields. 2. Boost to the seroma delivered an additional 10 Gy in 5 fractions. The total dose was 60.4 Gy.  (7) restaging studies:  (a) CT scan of the chest and bone scan 06/23/2017 showed stable scattered very small lung nodules, no bone lesions  (b) CT of the chest 10/17/2017 showed no new or progressive metastatic disease in the  chest. The small left lower lobe pulmonary nodule is stable  (c) PET scan on 03/02/2018: shows no findings for residual or recurrent right breast cancer   (8) right upper pole renal lesion noted to be enlarging on CT scan 06/22/2017  (a) no uptake on PET scan obtained 03/02/2018   PLAN: [name] is just a little over 3 years out from definitive diagnosis of metastatic breast cancer.  Her disease is well controlled and her PET scan in September showed no increased uptake.  That is very favorable.  She generally is tolerating the letrozole and palbociclib well.  The concern is that her [place] has been low and of course the second issue is that she is losing her hair.  I suggested she start biotin.  She can get that over-the-counter.  It may be 2 or 3 months before she sees a significant difference.  She is interested in changing  primary care physicians and I gave her some information regarding alternative medical groups.  Her ANC today is 1.0 which is stable.  She has good hemoglobin and platelet count.  Her sugar is a little bit up and we discussed that.  Otherwise the plan is to continue to check labs monthly.  She will see me again in 2 months  She knows to call for any other issue that may develop before that visit.    Monterio Bob, Virgie Dad, MD  06/04/19 9:51 AM Medical Oncology and Hematology [facility] [place], [place] [address] Tel. [phone]    Fax. [phone]   I, Wilburn Mylar, am acting as scribe for Dr. Virgie Dad. [name].  I, Lurline Del MD, have reviewed the above documentation for accuracy and completeness, and I agree with the above.

## 2019-06-04 ENCOUNTER — Other Ambulatory Visit: Payer: Self-pay

## 2019-06-04 ENCOUNTER — Inpatient Hospital Stay: Payer: Medicare Other | Attending: Oncology

## 2019-06-04 ENCOUNTER — Other Ambulatory Visit: Payer: Self-pay | Admitting: *Deleted

## 2019-06-04 ENCOUNTER — Inpatient Hospital Stay (HOSPITAL_BASED_OUTPATIENT_CLINIC_OR_DEPARTMENT_OTHER): Payer: Medicare Other | Admitting: Oncology

## 2019-06-04 VITALS — BP 154/71 | HR 87 | Temp 98.0°F | Resp 18 | Ht 64.0 in | Wt 251.3 lb

## 2019-06-04 DIAGNOSIS — C7800 Secondary malignant neoplasm of unspecified lung: Secondary | ICD-10-CM

## 2019-06-04 DIAGNOSIS — Z170 Estrogen receptor positive status [ER+]: Secondary | ICD-10-CM

## 2019-06-04 DIAGNOSIS — C50411 Malignant neoplasm of upper-outer quadrant of right female breast: Secondary | ICD-10-CM | POA: Diagnosis present

## 2019-06-04 DIAGNOSIS — C773 Secondary and unspecified malignant neoplasm of axilla and upper limb lymph nodes: Secondary | ICD-10-CM | POA: Insufficient documentation

## 2019-06-04 DIAGNOSIS — K760 Fatty (change of) liver, not elsewhere classified: Secondary | ICD-10-CM

## 2019-06-04 DIAGNOSIS — Z7984 Long term (current) use of oral hypoglycemic drugs: Secondary | ICD-10-CM | POA: Diagnosis not present

## 2019-06-04 DIAGNOSIS — Z7982 Long term (current) use of aspirin: Secondary | ICD-10-CM | POA: Diagnosis not present

## 2019-06-04 DIAGNOSIS — Z801 Family history of malignant neoplasm of trachea, bronchus and lung: Secondary | ICD-10-CM | POA: Diagnosis not present

## 2019-06-04 DIAGNOSIS — Z803 Family history of malignant neoplasm of breast: Secondary | ICD-10-CM | POA: Diagnosis not present

## 2019-06-04 DIAGNOSIS — Z90710 Acquired absence of both cervix and uterus: Secondary | ICD-10-CM | POA: Insufficient documentation

## 2019-06-04 DIAGNOSIS — Z8249 Family history of ischemic heart disease and other diseases of the circulatory system: Secondary | ICD-10-CM | POA: Diagnosis not present

## 2019-06-04 DIAGNOSIS — Z90721 Acquired absence of ovaries, unilateral: Secondary | ICD-10-CM | POA: Diagnosis not present

## 2019-06-04 DIAGNOSIS — Z9221 Personal history of antineoplastic chemotherapy: Secondary | ICD-10-CM | POA: Insufficient documentation

## 2019-06-04 DIAGNOSIS — Z923 Personal history of irradiation: Secondary | ICD-10-CM | POA: Insufficient documentation

## 2019-06-04 DIAGNOSIS — Z86718 Personal history of other venous thrombosis and embolism: Secondary | ICD-10-CM | POA: Insufficient documentation

## 2019-06-04 DIAGNOSIS — T451X5A Adverse effect of antineoplastic and immunosuppressive drugs, initial encounter: Secondary | ICD-10-CM

## 2019-06-04 DIAGNOSIS — Z79811 Long term (current) use of aromatase inhibitors: Secondary | ICD-10-CM | POA: Insufficient documentation

## 2019-06-04 DIAGNOSIS — Z8542 Personal history of malignant neoplasm of other parts of uterus: Secondary | ICD-10-CM | POA: Insufficient documentation

## 2019-06-04 DIAGNOSIS — C7801 Secondary malignant neoplasm of right lung: Secondary | ICD-10-CM

## 2019-06-04 DIAGNOSIS — Z79899 Other long term (current) drug therapy: Secondary | ICD-10-CM | POA: Diagnosis not present

## 2019-06-04 DIAGNOSIS — G620 Drug-induced polyneuropathy: Secondary | ICD-10-CM | POA: Diagnosis not present

## 2019-06-04 LAB — CBC WITH DIFFERENTIAL (CANCER CENTER ONLY)
Abs Immature Granulocytes: 0.01 10*3/uL (ref 0.00–0.07)
Basophils Absolute: 0 10*3/uL (ref 0.0–0.1)
Basophils Relative: 1 %
Eosinophils Absolute: 0 10*3/uL (ref 0.0–0.5)
Eosinophils Relative: 1 %
HCT: 34.3 % — ABNORMAL LOW (ref 36.0–46.0)
Hemoglobin: 11.5 g/dL — ABNORMAL LOW (ref 12.0–15.0)
Immature Granulocytes: 1 %
Lymphocytes Relative: 32 %
Lymphs Abs: 0.6 10*3/uL — ABNORMAL LOW (ref 0.7–4.0)
MCH: 33.4 pg (ref 26.0–34.0)
MCHC: 33.5 g/dL (ref 30.0–36.0)
MCV: 99.7 fL (ref 80.0–100.0)
Monocytes Absolute: 0.3 10*3/uL (ref 0.1–1.0)
Monocytes Relative: 15 %
Neutro Abs: 1 10*3/uL — ABNORMAL LOW (ref 1.7–7.7)
Neutrophils Relative %: 50 %
Platelet Count: 149 10*3/uL — ABNORMAL LOW (ref 150–400)
RBC: 3.44 MIL/uL — ABNORMAL LOW (ref 3.87–5.11)
RDW: 14.8 % (ref 11.5–15.5)
WBC Count: 2 10*3/uL — ABNORMAL LOW (ref 4.0–10.5)
nRBC: 0 % (ref 0.0–0.2)

## 2019-06-04 LAB — COMPREHENSIVE METABOLIC PANEL
ALT: 14 U/L (ref 0–44)
AST: 13 U/L — ABNORMAL LOW (ref 15–41)
Albumin: 3.9 g/dL (ref 3.5–5.0)
Alkaline Phosphatase: 54 U/L (ref 38–126)
Anion gap: 10 (ref 5–15)
BUN: 15 mg/dL (ref 8–23)
CO2: 26 mmol/L (ref 22–32)
Calcium: 8.9 mg/dL (ref 8.9–10.3)
Chloride: 105 mmol/L (ref 98–111)
Creatinine, Ser: 0.9 mg/dL (ref 0.44–1.00)
GFR calc Af Amer: >60 mL/min (ref >60)
GFR calc non Af Amer: >60 mL/min (ref >60)
Glucose, Bld: 249 mg/dL — ABNORMAL HIGH (ref 70–99)
Potassium: 4.3 mmol/L (ref 3.5–5.1)
Sodium: 141 mmol/L (ref 135–145)
Total Bilirubin: 0.4 mg/dL (ref 0.3–1.2)
Total Protein: 6.9 g/dL (ref 6.5–8.1)

## 2019-06-05 ENCOUNTER — Telehealth: Payer: Self-pay | Admitting: Oncology

## 2019-06-05 LAB — CANCER ANTIGEN 27.29: CA 27.29: 34.4 U/mL (ref 0.0–38.6)

## 2019-06-05 NOTE — Telephone Encounter (Signed)
I talk with patient regarding schedule  

## 2019-06-24 ENCOUNTER — Other Ambulatory Visit: Payer: Self-pay | Admitting: Internal Medicine

## 2019-06-24 MED ORDER — METFORMIN HCL ER 500 MG PO TB24: 500.0000 mg | ORAL_TABLET | Freq: Every day | ORAL | 3 refills | Status: DC

## 2019-06-24 MED ORDER — PIOGLITAZONE HCL 15 MG PO TABS: 15.0000 mg | ORAL_TABLET | Freq: Every day | ORAL | 3 refills | Status: DC

## 2019-07-01 MED FILL — IBRANCE 75 MG TABS: 75 | 28 days supply | Qty: 21 | Fill #6

## 2019-07-02 ENCOUNTER — Inpatient Hospital Stay: Payer: Medicare Other | Attending: Oncology

## 2019-07-02 ENCOUNTER — Other Ambulatory Visit: Payer: Self-pay

## 2019-07-02 DIAGNOSIS — Z170 Estrogen receptor positive status [ER+]: Secondary | ICD-10-CM | POA: Diagnosis not present

## 2019-07-02 DIAGNOSIS — Z79811 Long term (current) use of aromatase inhibitors: Secondary | ICD-10-CM | POA: Diagnosis not present

## 2019-07-02 DIAGNOSIS — C50411 Malignant neoplasm of upper-outer quadrant of right female breast: Secondary | ICD-10-CM

## 2019-07-02 DIAGNOSIS — C7801 Secondary malignant neoplasm of right lung: Secondary | ICD-10-CM

## 2019-07-02 DIAGNOSIS — Z8542 Personal history of malignant neoplasm of other parts of uterus: Secondary | ICD-10-CM

## 2019-07-02 DIAGNOSIS — K760 Fatty (change of) liver, not elsewhere classified: Secondary | ICD-10-CM

## 2019-07-02 LAB — CBC WITH DIFFERENTIAL (CANCER CENTER ONLY)
Abs Immature Granulocytes: 0.01 10*3/uL (ref 0.00–0.07)
Basophils Absolute: 0 10*3/uL (ref 0.0–0.1)
Basophils Relative: 1 %
Eosinophils Absolute: 0 10*3/uL (ref 0.0–0.5)
Eosinophils Relative: 1 %
HCT: 34 % — ABNORMAL LOW (ref 36.0–46.0)
Hemoglobin: 11.4 g/dL — ABNORMAL LOW (ref 12.0–15.0)
Immature Granulocytes: 1 %
Lymphocytes Relative: 36 %
Lymphs Abs: 0.7 10*3/uL (ref 0.7–4.0)
MCH: 32.8 pg (ref 26.0–34.0)
MCHC: 33.5 g/dL (ref 30.0–36.0)
MCV: 97.7 fL (ref 80.0–100.0)
Monocytes Absolute: 0.3 10*3/uL (ref 0.1–1.0)
Monocytes Relative: 16 %
Neutro Abs: 0.9 10*3/uL — ABNORMAL LOW (ref 1.7–7.7)
Neutrophils Relative %: 45 %
Platelet Count: 137 10*3/uL — ABNORMAL LOW (ref 150–400)
RBC: 3.48 MIL/uL — ABNORMAL LOW (ref 3.87–5.11)
RDW: 15 % (ref 11.5–15.5)
WBC Count: 2 10*3/uL — ABNORMAL LOW (ref 4.0–10.5)
nRBC: 0 % (ref 0.0–0.2)

## 2019-07-02 LAB — COMPREHENSIVE METABOLIC PANEL
ALT: 16 U/L (ref 0–44)
AST: 13 U/L — ABNORMAL LOW (ref 15–41)
Albumin: 4.3 g/dL (ref 3.5–5.0)
Alkaline Phosphatase: 58 U/L (ref 38–126)
Anion gap: 10 (ref 5–15)
BUN: 12 mg/dL (ref 8–23)
CO2: 26 mmol/L (ref 22–32)
Calcium: 8.8 mg/dL — ABNORMAL LOW (ref 8.9–10.3)
Chloride: 104 mmol/L (ref 98–111)
Creatinine, Ser: 0.83 mg/dL (ref 0.44–1.00)
GFR calc Af Amer: >60 mL/min (ref >60)
GFR calc non Af Amer: >60 mL/min (ref >60)
Glucose, Bld: 179 mg/dL — ABNORMAL HIGH (ref 70–99)
Potassium: 4.2 mmol/L (ref 3.5–5.1)
Sodium: 140 mmol/L (ref 135–145)
Total Bilirubin: 0.6 mg/dL (ref 0.3–1.2)
Total Protein: 7.3 g/dL (ref 6.5–8.1)

## 2019-07-03 LAB — CANCER ANTIGEN 27.29: CA 27.29: 44.2 U/mL — ABNORMAL HIGH (ref 0.0–38.6)

## 2019-07-03 NOTE — Telephone Encounter (Signed)
No entry 

## 2019-07-24 ENCOUNTER — Other Ambulatory Visit: Payer: Self-pay | Admitting: Oncology

## 2019-07-24 DIAGNOSIS — Z170 Estrogen receptor positive status [ER+]: Secondary | ICD-10-CM

## 2019-07-24 DIAGNOSIS — C50411 Malignant neoplasm of upper-outer quadrant of right female breast: Secondary | ICD-10-CM

## 2019-07-29 MED FILL — IBRANCE 75 MG TABS: 75 | 28 days supply | Qty: 21 | Fill #0

## 2019-07-29 NOTE — Progress Notes (Signed)
[place]  [phone] [phone]     ID: [name] DOB: 07-May-1946  MR#: [id number]  TSV#:[id number]  Patient Care Team: Biagio Borg, MD as PCP - General (Internal Medicine) Alphonsa Overall, MD as Consulting Physician (General Surgery) Sentoria Brent, Virgie Dad, MD as Consulting Physician (Oncology) Kyung Rudd, MD as Consulting Physician (Radiation Oncology) Bobbye Charleston, MD as Consulting Physician (Obstetrics and Gynecology) Nada Libman, MD as Referring Physician (Specialist) Vevelyn Royals, MD as Consulting Physician (Ophthalmology) Lamonte Sakai Rose Fillers, MD as Consulting Physician (Pulmonary Disease) Alexis Frock, MD as Consulting Physician (Urology) Ander Slade, Carlisle Beers, MD as Referring Physician (Ophthalmology) OTHER MD:   CHIEF COMPLAINT: Estrogen receptor positive breast cancer  CURRENT TREATMENT: Letrozole, palbociclib   INTERVAL HISTORY: [name] returns today for follow-up of her estrogen receptor positive stage IV breast cancer.    She continues on letrozole.  Aside from hair loss she tolerates this well.  Vaginal dryness is not a major concern and hot flashes never have been an issue.  She does very well with the palbociclib also.  She is just completing her "off" week.  Her tumor marker is slightly up: Lab Results  Component Value Date   CA2729 35.5 07/30/2019   CA2729 44.2 (H) 07/02/2019   CA2729 34.4 06/04/2019   CA2729 28.4 05/07/2019   CA2729 32.6 04/09/2019   She will be due for repeat mammogram in 10/2019.    REVIEW OF SYSTEMS: [name] tells me she is "not feeling well".  She is more short of breath, her blood pressure is up, she is got slight nausea.  At home she is watching TV.  She has a bicycle at home and she does use it sometimes, but not regularly.  She has received her first COVID-19 shot and the second 1 will be later this week.  Aside from these issues a detailed review of systems today was  stable   BREAST CANCER HISTORY: From the original intake note:  [name] had screening mammography showing some suspicious calcifications in the right breast leading to right diagnostic mammography with ultrasonography 01/22/2016 at [facility]. The breast density was category C. In the upper right breast there was a 2.3 cm mass with additional masses measuring 0.9 and 0.7 cm. There was also a possible additional 0.8 mass in the lower inner quadrant. Ultrasound confirmed an irregular hypoechoic mass in the right breast upper outer quadrant measuring 2.0 cm. There were other masses measuring 0.7 and 0.8 cm by ultrasonography. The right axilla was sonographically benign.  Biopsy of a 12:00 and 4:00 mass in the right breast 01/22/2016 showed ([id number]) both specimens showing invasive ductal carcinoma, grade 1 or 2, both 95% estrogen receptor positive, both 95% progesterone receptor positive, both with strong staining intensity, with MIB-1 ranging from 10-15%, and both HER-2 negative, the signals ratio being 1.23-1.42, and the number per cell 1.85-2.59.  Her subsequent history is as detailed below   PAST MEDICAL HISTORY: Past Medical History:  Diagnosis Date   Breast cancer ([place])    Cancer ([place]) 02/2016   right breast   DIABETES MELLITUS, TYPE II 01/04/2007   only takes actoplus daily   Dizziness and giddiness 02/29/2008   DVT, HX OF    at age 57 in right buttocks   Dyspnea    due to lung cancer   Family history of breast cancer    GERD 01/04/2007   pt reports resolved    GLAUCOMA 07/30/2008   both eyes   History of  blood transfusion    no abnormal  reaction   History of uterine cancer 2000   hysterectomy done   HYPERLIPIDEMIA 01/04/2007   taking Pravastatin daily   HYPERTENSION 01/04/2007   takes Lisinopril daily   Joint pain    Joint swelling    Leg cramps    LEG PAIN, LEFT 07/06/2007   NUMBNESS 07/30/2008   in fingers;pt states from [facility]   OSTEOARTHRITIS, HIP  09/25/2009   OTITIS MEDIA, ACUTE, BILATERAL 02/29/2008   Overweight(278.02) 01/04/2007   Peripheral vascular disease ([place])    Personal history of radiation therapy 2018   Pneumonia    PONV (postoperative nausea and vomiting)    SLEEP APNEA, OBSTRUCTIVE    doesn't use a cpap;study done about 64yr ago   TDaniels HX OF 01/04/2007   Vision loss    left eye    PAST SURGICAL HISTORY: Past Surgical History:  Procedure Laterality Date   ABDOMINAL HYSTERECTOMY  2000   BREAST LUMPECTOMY Right 01/13/2017   x2   BREAST LUMPECTOMY WITH RADIOACTIVE SEED AND SENTINEL LYMPH NODE BIOPSY Right 01/13/2017   Procedure: RIGHT BREAST RADIOACTIVE SEED X'S 2 GUIDED LUMPECTOMY WITH RADIOACTIVE SEED TARGETED AXILLARYLYMPH NODE EXCISION AND RIGHT AXILLARY SENTINEL LYMPH NODE BIOPSY;  Surgeon: NAlphonsa Overall MD;  [facility]  Service: General;  Laterality: Right;  2 SEEDS IN RIGHT BREAST 1 SEED IN RIGHT AXILLARY NODE   CHOLECYSTECTOMY     ENDOBRONCHIAL ULTRASOUND Bilateral 03/28/2016   Procedure: ENDOBRONCHIAL ULTRASOUND;  Surgeon: RCollene Gobble MD;  Location: [facility];  Service: Cardiopulmonary;  Laterality: Bilateral;   EYE SURGERY  13   shunt left and lazer eye surgery on right cataract and retenia tear with repair   growth removal  2004   from thumb   KNEE ARTHROSCOPY Right    mulitple eye surgeries     both eyes, cataracts with ioc done both eyes   OOPHORECTOMY     right lumpectomy with axillary node dissection Right 01/2017   TOTAL HIP ARTHROPLASTY  06/24/2011   Procedure: TOTAL HIP ARTHROPLASTY;  Surgeon: FKerin Salen  [facility]  Service: Orthopedics;  Laterality: Right;   TOTAL HIP ARTHROPLASTY Left 11/12/2012   Dr RMayer Camel  TOTAL HIP ARTHROPLASTY Left 11/12/2012   Procedure: TOTAL HIP ARTHROPLASTY;  Surgeon: FKerin Salen MD;  [facility]  Service: Orthopedics;  Laterality: Left;  DEPUY PINNACLE    FAMILY HISTORY Family History   Problem Relation Age of Onset   Dementia Mother    Cancer Mother        Breast and lung cancer   Stroke Sister    Breast cancer Sister 570  Heart attack Maternal Aunt    Lung cancer Maternal Grandmother        non smoker   Glaucoma Maternal Grandfather    Anesthesia problems Neg Hx    The patient's father died at age 74 the patient's mother died at age 74 She had breast and lung cancers diagnosed shortly before her death. The patient had no brothers, 2 sisters. One sister was diagnosed with breast cancer at the age of 517   GYNECOLOGIC HISTORY:  No LMP recorded. Patient has had a hysterectomy. Menarche age 74 first live birth age 74 the patient is GX P1. She had a hysterectomy for endometrial cancer in the year 2000. She did not take hormone replacement. She did use oral contraceptives for more than 20 years remotely, with no complications.   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 She is home with [name]. He is a retired Dealer.Their son [name] also lives in [place].    ADVANCED DIRECTIVES: In place   HEALTH MAINTENANCE: Social History   Tobacco Use   Smoking status: Never Smoker   Smokeless tobacco: Never Used  Substance Use Topics   Alcohol use: No   Drug use: No     Colonoscopy: Never  PAP: Status post hysterectomy  Bone density: Remote   Allergies  Allergen Reactions   Codeine Hives    Hycodan syrup   Fluorescein Nausea And Vomiting    ? IV dye for retina specialist   Lipitor [Atorvastatin Calcium]     Leg cramp   Oxycodone Nausea And Vomiting    Patient vomited for 3 days after taking   Sitagliptin Phosphate Nausea And Vomiting   Sulfa Drugs Cross Reactors Nausea And Vomiting    Current Outpatient Medications  Medication Sig Dispense Refill   acetaminophen (TYLENOL) 500 MG tablet Take 1,000 mg by mouth every 4 (four) hours as  needed for moderate pain or fever.     amLODipine (NORVASC) 5 MG tablet Take 1 tablet (5 mg total) by mouth daily. 90 tablet 3   aspirin EC 81 MG tablet Take 81 mg by mouth daily at 6 PM. 1700     brimonidine-timolol (COMBIGAN) 0.2-0.5 % ophthalmic solution Place 1 drop into both eyes three times daily     cholecalciferol (VITAMIN D) 1000 units tablet Take 1,000 Units by mouth daily.     dorzolamide-timolol (COSOPT) 22.3-6.8 MG/ML ophthalmic solution Place 1 drop into both eyes 2 (two) times daily.     hydrochlorothiazide (MICROZIDE) 12.5 MG capsule Take 1 capsule (12.5 mg total) by mouth daily. 90 capsule 3   IBRANCE 75 MG tablet TAKE 1 TABLET (75 MG TOTAL) BY MOUTH DAILY. TAKE FOR 21 DAYS ON, 7 DAYS OFF, REPEAT EVERY 28 DAYS. 21 tablet 6   letrozole (FEMARA) 2.5 MG tablet Take 1 tablet (2.5 mg total) by mouth at bedtime. 90 tablet 4   lisinopril (PRINIVIL,ZESTRIL) 40 MG tablet Take 1 tablet (40 mg total) by mouth daily. 90 tablet 3   lovastatin (MEVACOR) 20 MG tablet Take 1 tablet (20 mg total) by mouth every evening. 90 tablet 3   metFORMIN (GLUCOPHAGE-XR) 500 MG 24 hr tablet Take 1 tablet (500 mg total) by mouth daily with breakfast. 180 tablet 3   pioglitazone (ACTOS) 15 MG tablet Take 1 tablet (15 mg total) by mouth daily. 90 tablet 3   No current facility-administered medications for this visit.     OBJECTIVE: Morbidly obese white woman who appears stated age  48:   07/30/19 1107  BP: (!) 175/89  Pulse: 78  Resp: 18  Temp: 98.3 F (36.8 C)  SpO2: 100%   Wt Readings from Last 3 Encounters:  07/30/19 252 lb 4.8 oz (114.4 kg)  06/04/19 251 lb 4.8 oz (114 kg)  03/06/19 248 lb 14.4 oz (112.9 kg)   Body mass index is 43.31 kg/m.    ECOG FS:1 - Symptomatic but completely ambulatory  Sclerae unicteric, EOMs intact Wearing a mask No cervical or supraclavicular adenopathy Lungs no rales or rhonchi Heart regular rate and rhythm Abd soft, nontender, positive  bowel sounds MSK no focal spinal tenderness, no upper extremity lymphedema Neuro: nonfocal, well oriented, appropriate affect Breasts: The right breast has undergone lumpectomy  and radiation.  There is no evidence of local recurrence.  The left breast is benign.  Both axillae are benign.   LAB RESULTS:  CMP     Component Value Date/Time   NA 141 07/30/2019 1028   NA 140 05/29/2017 1254   K 4.3 07/30/2019 1028   K 4.4 05/29/2017 1254   CL 108 07/30/2019 1028   CO2 25 07/30/2019 1028   CO2 25 05/29/2017 1254   GLUCOSE 189 (H) 07/30/2019 1028   GLUCOSE 154 (H) 05/29/2017 1254   BUN 12 07/30/2019 1028   BUN 11.5 05/29/2017 1254   CREATININE 0.82 07/30/2019 1028   CREATININE 0.80 04/02/2018 1116   CREATININE 0.8 05/29/2017 1254   CALCIUM 8.7 (L) 07/30/2019 1028   CALCIUM 9.3 05/29/2017 1254   PROT 6.7 07/30/2019 1028   PROT 7.0 05/29/2017 1254   ALBUMIN 4.0 07/30/2019 1028   ALBUMIN 4.1 05/29/2017 1254   AST 12 (L) 07/30/2019 1028   AST 13 (L) 04/02/2018 1116   AST 13 05/29/2017 1254   ALT 19 07/30/2019 1028   ALT 15 04/02/2018 1116   ALT 14 05/29/2017 1254   ALKPHOS 53 07/30/2019 1028   ALKPHOS 66 05/29/2017 1254   BILITOT 0.5 07/30/2019 1028   BILITOT 0.4 04/02/2018 1116   BILITOT 0.50 05/29/2017 1254   GFRNONAA >60 07/30/2019 1028   GFRNONAA >60 04/02/2018 1116   GFRAA >60 07/30/2019 1028   GFRAA >60 04/02/2018 1116    INo results found for: SPEP, UPEP  Lab Results  Component Value Date   WBC 1.8 (L) 07/30/2019   NEUTROABS 0.9 (L) 07/30/2019   HGB 11.2 (L) 07/30/2019   HCT 32.9 (L) 07/30/2019   MCV 96.2 07/30/2019   PLT 112 (L) 07/30/2019      Chemistry      Component Value Date/Time   NA 141 07/30/2019 1028   NA 140 05/29/2017 1254   K 4.3 07/30/2019 1028   K 4.4 05/29/2017 1254   CL 108 07/30/2019 1028   CO2 25 07/30/2019 1028   CO2 25 05/29/2017 1254   BUN 12 07/30/2019 1028   BUN 11.5 05/29/2017 1254   CREATININE 0.82 07/30/2019 1028    CREATININE 0.80 04/02/2018 1116   CREATININE 0.8 05/29/2017 1254      Component Value Date/Time   CALCIUM 8.7 (L) 07/30/2019 1028   CALCIUM 9.3 05/29/2017 1254   ALKPHOS 53 07/30/2019 1028   ALKPHOS 66 05/29/2017 1254   AST 12 (L) 07/30/2019 1028   AST 13 (L) 04/02/2018 1116   AST 13 05/29/2017 1254   ALT 19 07/30/2019 1028   ALT 15 04/02/2018 1116   ALT 14 05/29/2017 1254   BILITOT 0.5 07/30/2019 1028   BILITOT 0.4 04/02/2018 1116   BILITOT 0.50 05/29/2017 1254       No results found for: LABCA2  No components found for: [id number]  No results for input(s): INR in the last 168 hours.  Urinalysis    Component Value Date/Time   COLORURINE YELLOW 08/18/2017 1322   APPEARANCEUR HAZY (A) 08/18/2017 1322   LABSPEC 1.010 08/18/2017 1322   PHURINE 7.0 08/18/2017 1322   GLUCOSEU NEGATIVE 08/18/2017 1322   GLUCOSEU NEGATIVE 10/30/2014 1513   HGBUR LARGE (A) 08/18/2017 1322   BILIRUBINUR NEGATIVE 08/18/2017 1322   KETONESUR NEGATIVE 08/18/2017 1322   PROTEINUR 30 (A) 08/18/2017 1322   UROBILINOGEN 0.2 10/30/2014 1513   NITRITE NEGATIVE 08/18/2017 1322   LEUKOCYTESUR LARGE (A) 08/18/2017 1322    STUDIES: No results  found.    ELIGIBLE FOR AVAILABLE RESEARCH PROTOCOL: no  ASSESSMENT: 74 y.o. [place] woman with a remote history of early stage endometrial cancer, subsequently status post right breast upper outer quadrant biopsy 01/22/2016 for a clinically multifocal T2 N0, stage 2A invasive ductal carcinoma, grade 1, estrogen and progesterone receptor positive, HER-2 negative, with an MIB-1 between 10 and 15%.  (1) right axillary lymph node biopsy 03/02/2016 positive  (2) genetics testing 01/13/2016 through the Custom gene panel offered by [facility] found no deleterious mutations in  ATM, BARD1, BRCA1, BRCA2, BRIP1, CDH1, CHEK2, EPCAM, FANCC, MLH1, MSH2, MSH6, MUTYH, NBN, PALB2, PMS2, POLD1, PTEN, RAD51C, RAD51D, TP53, and XRCC2  METASTATIC DISEASE: OCT 2017 (3) CT scans  of the chest abdomen and pelvis obtained 03/10/2016 are consistent with bilateral lung metastases and mediastinal and hilar nodal involvement, but no liver or bone spread  (a) bronchoscopic lymph node biopsy 2 (station 7, 13R) 03/28/2016 confirms metastatic adenocarcinoma, estrogen receptor positive, HER-2 not amplified  (b) baseline CA-27-29 on 04/18/2016 was 137.5.  (4) letrozole started 03/15/2016, palbociclib added 03/29/2016 at 125 mg/day, 21/7  (a) dose decreased to 100 mg per day, 21/7, beginning with February cycle  (b) palbociclib held 01/31/2017, with increasing symptoms  (c) palbociclib resumed October 2018 at 75 mg daily  (d) palbociclib dose reduced to 75 mg every other day February through April 2019  (e) palbociclib dose resumed at 75 mg daily as of 10/17/2017  (f) palbociclib dose decreased to 75 mg every other day beginning 07/31/2019  (5) status post double right lumpectomies and right axillary lymph node sampling 01/13/2017 for 2 separate invasive ductal carcinoma lesions, pT1a and pT1b, N1a, with negative margins, both lesions being estrogen and progesterone receptor positive and HER-2 negative  (6) adjuvant radiation completed 07/05/2017 1. 50.4 Gy in 28 fractions to the right breast and supraclavicular region using whole-breast tangent fields. 2. Boost to the seroma delivered an additional 10 Gy in 5 fractions. The total dose was 60.4 Gy.  (7) restaging studies:  (a) CT scan of the chest and bone scan 06/23/2017 showed stable scattered very small lung nodules, no bone lesions  (b) CT of the chest 10/17/2017 showed no new or progressive metastatic disease in the chest. The small left lower lobe pulmonary nodule is stable  (c) PET scan on 03/02/2018: shows no findings for residual or recurrent right breast cancer   (8) right upper pole renal lesion noted to be enlarging on CT scan 06/22/2017  (a) no uptake on PET scan obtained 03/02/2018   PLAN: Finnlee is now 3-1/2  years out from definitive diagnosis of metastatic breast cancer.  Clinically her disease has been very well controlled and her most recent PET scan almost 6 months ago was essentially negative.  She has been following her markers very closely and is concerned about the slight uptake in her CA 27-29.  She also has been "feeling bad" and somewhat nonspecific manner.  It could all be deconditioning.  We discussed her using the bicycle at home 5 minutes at a time 3-4 times a day to see if she can improve a little bit in her exercise tolerance.  I am dropping the palbociclib again to 75 mg every other day given her persistently low ANC.  Otherwise I am setting her up for a repeat CT of the chest in March, before her return visit here.  I anticipate good news  Total encounter time 30 minutes.*    Shizuo Biskup, Virgie Dad, MD  07/31/19  8:19 AM Medical Oncology and Hematology [facility] [place], [employer] [employer] [address] Tel. [phone]    Fax. [phone]   I, Wilburn Mylar, am acting as scribe for Dr. Virgie Dad. Quaron Delacruz.  I, Lurline Del MD, have reviewed the above documentation for accuracy and completeness, and I agree with the above.  *Total Encounter Time as defined by the Centers for Medicare and Medicaid Services includes, in addition to the face-to-face time of a patient visit (documented in the note above) non-face-to-face time: obtaining and reviewing outside history, ordering and reviewing medications, tests or procedures, care coordination (communications with other health care professionals or caregivers) and documentation in the medical record.

## 2019-07-30 ENCOUNTER — Inpatient Hospital Stay: Payer: Medicare Other | Attending: Oncology

## 2019-07-30 ENCOUNTER — Inpatient Hospital Stay (HOSPITAL_BASED_OUTPATIENT_CLINIC_OR_DEPARTMENT_OTHER): Payer: Medicare Other | Admitting: Oncology

## 2019-07-30 ENCOUNTER — Ambulatory Visit: Payer: Medicare Other | Admitting: Oncology

## 2019-07-30 ENCOUNTER — Telehealth: Payer: Self-pay | Admitting: Oncology

## 2019-07-30 ENCOUNTER — Other Ambulatory Visit: Payer: Self-pay

## 2019-07-30 VITALS — BP 175/89 | HR 78 | Temp 98.3°F | Resp 18 | Ht 64.0 in | Wt 252.3 lb

## 2019-07-30 DIAGNOSIS — Z8542 Personal history of malignant neoplasm of other parts of uterus: Secondary | ICD-10-CM | POA: Diagnosis not present

## 2019-07-30 DIAGNOSIS — Z86718 Personal history of other venous thrombosis and embolism: Secondary | ICD-10-CM | POA: Diagnosis not present

## 2019-07-30 DIAGNOSIS — C50411 Malignant neoplasm of upper-outer quadrant of right female breast: Secondary | ICD-10-CM

## 2019-07-30 DIAGNOSIS — Z79811 Long term (current) use of aromatase inhibitors: Secondary | ICD-10-CM | POA: Diagnosis not present

## 2019-07-30 DIAGNOSIS — Z7982 Long term (current) use of aspirin: Secondary | ICD-10-CM | POA: Insufficient documentation

## 2019-07-30 DIAGNOSIS — C7801 Secondary malignant neoplasm of right lung: Secondary | ICD-10-CM

## 2019-07-30 DIAGNOSIS — Z923 Personal history of irradiation: Secondary | ICD-10-CM | POA: Diagnosis not present

## 2019-07-30 DIAGNOSIS — Z7984 Long term (current) use of oral hypoglycemic drugs: Secondary | ICD-10-CM | POA: Insufficient documentation

## 2019-07-30 DIAGNOSIS — Z79899 Other long term (current) drug therapy: Secondary | ICD-10-CM | POA: Insufficient documentation

## 2019-07-30 DIAGNOSIS — E119 Type 2 diabetes mellitus without complications: Secondary | ICD-10-CM | POA: Diagnosis not present

## 2019-07-30 DIAGNOSIS — Z803 Family history of malignant neoplasm of breast: Secondary | ICD-10-CM | POA: Diagnosis not present

## 2019-07-30 DIAGNOSIS — C7800 Secondary malignant neoplasm of unspecified lung: Secondary | ICD-10-CM

## 2019-07-30 DIAGNOSIS — C773 Secondary and unspecified malignant neoplasm of axilla and upper limb lymph nodes: Secondary | ICD-10-CM | POA: Diagnosis not present

## 2019-07-30 DIAGNOSIS — Z8349 Family history of other endocrine, nutritional and metabolic diseases: Secondary | ICD-10-CM | POA: Diagnosis not present

## 2019-07-30 DIAGNOSIS — T451X5A Adverse effect of antineoplastic and immunosuppressive drugs, initial encounter: Secondary | ICD-10-CM | POA: Diagnosis not present

## 2019-07-30 DIAGNOSIS — Z90710 Acquired absence of both cervix and uterus: Secondary | ICD-10-CM | POA: Diagnosis not present

## 2019-07-30 DIAGNOSIS — Z8249 Family history of ischemic heart disease and other diseases of the circulatory system: Secondary | ICD-10-CM | POA: Insufficient documentation

## 2019-07-30 DIAGNOSIS — Z170 Estrogen receptor positive status [ER+]: Secondary | ICD-10-CM | POA: Insufficient documentation

## 2019-07-30 DIAGNOSIS — G620 Drug-induced polyneuropathy: Secondary | ICD-10-CM

## 2019-07-30 DIAGNOSIS — Z9221 Personal history of antineoplastic chemotherapy: Secondary | ICD-10-CM | POA: Insufficient documentation

## 2019-07-30 DIAGNOSIS — R918 Other nonspecific abnormal finding of lung field: Secondary | ICD-10-CM | POA: Insufficient documentation

## 2019-07-30 DIAGNOSIS — I10 Essential (primary) hypertension: Secondary | ICD-10-CM | POA: Insufficient documentation

## 2019-07-30 DIAGNOSIS — Z801 Family history of malignant neoplasm of trachea, bronchus and lung: Secondary | ICD-10-CM | POA: Insufficient documentation

## 2019-07-30 DIAGNOSIS — K760 Fatty (change of) liver, not elsewhere classified: Secondary | ICD-10-CM

## 2019-07-30 LAB — COMPREHENSIVE METABOLIC PANEL
ALT: 19 U/L (ref 0–44)
AST: 12 U/L — ABNORMAL LOW (ref 15–41)
Albumin: 4 g/dL (ref 3.5–5.0)
Alkaline Phosphatase: 53 U/L (ref 38–126)
Anion gap: 8 (ref 5–15)
BUN: 12 mg/dL (ref 8–23)
CO2: 25 mmol/L (ref 22–32)
Calcium: 8.7 mg/dL — ABNORMAL LOW (ref 8.9–10.3)
Chloride: 108 mmol/L (ref 98–111)
Creatinine, Ser: 0.82 mg/dL (ref 0.44–1.00)
GFR calc Af Amer: >60 mL/min (ref >60)
GFR calc non Af Amer: >60 mL/min (ref >60)
Glucose, Bld: 189 mg/dL — ABNORMAL HIGH (ref 70–99)
Potassium: 4.3 mmol/L (ref 3.5–5.1)
Sodium: 141 mmol/L (ref 135–145)
Total Bilirubin: 0.5 mg/dL (ref 0.3–1.2)
Total Protein: 6.7 g/dL (ref 6.5–8.1)

## 2019-07-30 LAB — CBC WITH DIFFERENTIAL (CANCER CENTER ONLY)
Abs Immature Granulocytes: 0.02 10*3/uL (ref 0.00–0.07)
Basophils Absolute: 0 10*3/uL (ref 0.0–0.1)
Basophils Relative: 1 %
Eosinophils Absolute: 0 10*3/uL (ref 0.0–0.5)
Eosinophils Relative: 1 %
HCT: 32.9 % — ABNORMAL LOW (ref 36.0–46.0)
Hemoglobin: 11.2 g/dL — ABNORMAL LOW (ref 12.0–15.0)
Immature Granulocytes: 1 %
Lymphocytes Relative: 35 %
Lymphs Abs: 0.6 10*3/uL — ABNORMAL LOW (ref 0.7–4.0)
MCH: 32.7 pg (ref 26.0–34.0)
MCHC: 34 g/dL (ref 30.0–36.0)
MCV: 96.2 fL (ref 80.0–100.0)
Monocytes Absolute: 0.3 10*3/uL (ref 0.1–1.0)
Monocytes Relative: 14 %
Neutro Abs: 0.9 10*3/uL — ABNORMAL LOW (ref 1.7–7.7)
Neutrophils Relative %: 48 %
Platelet Count: 112 10*3/uL — ABNORMAL LOW (ref 150–400)
RBC: 3.42 MIL/uL — ABNORMAL LOW (ref 3.87–5.11)
RDW: 15 % (ref 11.5–15.5)
WBC Count: 1.8 10*3/uL — ABNORMAL LOW (ref 4.0–10.5)
nRBC: 1.1 % — ABNORMAL HIGH (ref 0.0–0.2)

## 2019-07-30 NOTE — Telephone Encounter (Signed)
I talk with patient regarding schedule  

## 2019-07-31 ENCOUNTER — Telehealth: Payer: Self-pay | Admitting: *Deleted

## 2019-07-31 LAB — CANCER ANTIGEN 27.29: CA 27.29: 35.5 U/mL (ref 0.0–38.6)

## 2019-07-31 NOTE — Telephone Encounter (Signed)
This RN spoke with pt per her call -  Regarding 3 concerns  1. upcoming dental work and being on [place].  In April she will be having an implant, a bridge and a cap- the date of the insertion of dental post for implant is 4/25.  She states she will be having work done over a 4 week period " will I need to be off my Ibrance the whole time ?"  She is concerned about being off medication due to current status of control of disease.  This RN informed pt above can be discussed further at pending visit with MD in March - but likely she may only have to be off for insertion of post which is a more invasive procedure.  2. She states her tumor marker has decreased to 35 " normal range " and is asking if she should proceed with scheduling of CT " because he was getting it because my number was elevated "  3. She wanted to " just let Dr Jana Hakim know because it states on the sheets I get with my Ibrance - I have episodes of vomiting "  She states " sometimes at dinner time- after I eat- I start coughing - and then vomit "  She denies nausea- " and am trying to figure out what is making me cough - that then causes me to vomit "  She states besides the inquiry about the scan- the other issues can be discussed at MD visit in March.  Above will be communicated with MD and call returned to pt regarding CT scan.

## 2019-08-02 ENCOUNTER — Other Ambulatory Visit: Payer: Self-pay | Admitting: Oncology

## 2019-08-26 ENCOUNTER — Telehealth: Payer: Self-pay | Admitting: *Deleted

## 2019-08-26 NOTE — Telephone Encounter (Signed)
This RN spoke with the patient per her need to cancel appointment with Dr Jana Hakim due to other appointments needed to do this week with herself and her husband.  Concern per [name] is coordinating Ibrance therapy with pending dental implants in April.  Presently [name] is due to restart her Ibrance on 3/17 with cycle ending 4/6.  She is scheduled to see Dr Jana Hakim on 4/13 with dental work planned for 4/20.  This RN informed pt above is appropriate and works well for checking lab work for dental implants.  No further needs at this time.  Of note pt declines to be seen by midlevel- she prefers to be seen by MD only.

## 2019-08-27 ENCOUNTER — Other Ambulatory Visit: Payer: Medicare Other

## 2019-08-27 ENCOUNTER — Inpatient Hospital Stay: Payer: Medicare Other | Attending: Oncology

## 2019-08-27 ENCOUNTER — Inpatient Hospital Stay: Payer: Medicare Other | Admitting: Adult Health

## 2019-08-27 ENCOUNTER — Ambulatory Visit: Payer: Medicare Other | Admitting: Oncology

## 2019-08-27 ENCOUNTER — Other Ambulatory Visit: Payer: Self-pay

## 2019-08-27 DIAGNOSIS — C50411 Malignant neoplasm of upper-outer quadrant of right female breast: Secondary | ICD-10-CM | POA: Diagnosis not present

## 2019-08-27 DIAGNOSIS — Z8542 Personal history of malignant neoplasm of other parts of uterus: Secondary | ICD-10-CM

## 2019-08-27 DIAGNOSIS — C7801 Secondary malignant neoplasm of right lung: Secondary | ICD-10-CM

## 2019-08-27 DIAGNOSIS — K760 Fatty (change of) liver, not elsewhere classified: Secondary | ICD-10-CM

## 2019-08-27 DIAGNOSIS — Z170 Estrogen receptor positive status [ER+]: Secondary | ICD-10-CM | POA: Insufficient documentation

## 2019-08-27 DIAGNOSIS — C7800 Secondary malignant neoplasm of unspecified lung: Secondary | ICD-10-CM | POA: Diagnosis not present

## 2019-08-27 LAB — CBC WITH DIFFERENTIAL (CANCER CENTER ONLY)
Abs Immature Granulocytes: 0.03 10*3/uL (ref 0.00–0.07)
Basophils Absolute: 0 10*3/uL (ref 0.0–0.1)
Basophils Relative: 1 %
Eosinophils Absolute: 0 10*3/uL (ref 0.0–0.5)
Eosinophils Relative: 1 %
HCT: 36.3 % (ref 36.0–46.0)
Hemoglobin: 12.2 g/dL (ref 12.0–15.0)
Immature Granulocytes: 1 %
Lymphocytes Relative: 20 %
Lymphs Abs: 0.6 10*3/uL — ABNORMAL LOW (ref 0.7–4.0)
MCH: 32.4 pg (ref 26.0–34.0)
MCHC: 33.6 g/dL (ref 30.0–36.0)
MCV: 96.3 fL (ref 80.0–100.0)
Monocytes Absolute: 0.3 10*3/uL (ref 0.1–1.0)
Monocytes Relative: 11 %
Neutro Abs: 2.2 10*3/uL (ref 1.7–7.7)
Neutrophils Relative %: 66 %
Platelet Count: DECREASED 10*3/uL (ref 150–400)
RBC: 3.77 MIL/uL — ABNORMAL LOW (ref 3.87–5.11)
RDW: 14.2 % (ref 11.5–15.5)
WBC Count: 3.2 10*3/uL — ABNORMAL LOW (ref 4.0–10.5)
nRBC: 0 % (ref 0.0–0.2)

## 2019-08-27 LAB — COMPREHENSIVE METABOLIC PANEL
ALT: 21 U/L (ref 0–44)
AST: 17 U/L (ref 15–41)
Albumin: 3.8 g/dL (ref 3.5–5.0)
Alkaline Phosphatase: 69 U/L (ref 38–126)
Anion gap: 10 (ref 5–15)
BUN: 10 mg/dL (ref 8–23)
CO2: 25 mmol/L (ref 22–32)
Calcium: 8.9 mg/dL (ref 8.9–10.3)
Chloride: 105 mmol/L (ref 98–111)
Creatinine, Ser: 0.83 mg/dL (ref 0.44–1.00)
GFR calc Af Amer: >60 mL/min (ref >60)
GFR calc non Af Amer: >60 mL/min (ref >60)
Glucose, Bld: 295 mg/dL — ABNORMAL HIGH (ref 70–99)
Potassium: 4.6 mmol/L (ref 3.5–5.1)
Sodium: 140 mmol/L (ref 135–145)
Total Bilirubin: 0.4 mg/dL (ref 0.3–1.2)
Total Protein: 6.6 g/dL (ref 6.5–8.1)

## 2019-08-28 ENCOUNTER — Inpatient Hospital Stay: Payer: Medicare Other

## 2019-08-28 ENCOUNTER — Ambulatory Visit: Payer: Medicare Other | Admitting: Oncology

## 2019-08-28 DIAGNOSIS — H401134 Primary open-angle glaucoma, bilateral, indeterminate stage: Secondary | ICD-10-CM | POA: Diagnosis not present

## 2019-08-28 DIAGNOSIS — Z961 Presence of intraocular lens: Secondary | ICD-10-CM | POA: Diagnosis not present

## 2019-08-28 DIAGNOSIS — E119 Type 2 diabetes mellitus without complications: Secondary | ICD-10-CM | POA: Diagnosis not present

## 2019-08-28 LAB — CANCER ANTIGEN 27.29: CA 27.29: 38.3 U/mL (ref 0.0–38.6)

## 2019-09-03 ENCOUNTER — Other Ambulatory Visit: Payer: Self-pay | Admitting: Oncology

## 2019-09-03 DIAGNOSIS — Z9889 Other specified postprocedural states: Secondary | ICD-10-CM

## 2019-09-07 ENCOUNTER — Other Ambulatory Visit: Payer: Self-pay | Admitting: Internal Medicine

## 2019-09-07 NOTE — Telephone Encounter (Signed)
Please refill as per office routine med refill policy (all routine meds refilled for 3 mo or monthly per pt preference up to one year from last visit, then month to month grace period for 3 mo, then further med refills will have to be denied)  

## 2019-09-23 NOTE — Progress Notes (Signed)
[place]  [phone] [phone]     ID: [name] DOB: 1945/07/10  MR#: [id number]  HMC#:[id number]  Patient Care Team: Biagio Borg, MD as PCP - General (Internal Medicine) Alphonsa Overall, MD as Consulting Physician (General Surgery) Sharvil Hoey, Virgie Dad, MD as Consulting Physician (Oncology) Kyung Rudd, MD as Consulting Physician (Radiation Oncology) Bobbye Charleston, MD as Consulting Physician (Obstetrics and Gynecology) Nada Libman, MD as Referring Physician (Specialist) Vevelyn Royals, MD as Consulting Physician (Ophthalmology) Lamonte Sakai Rose Fillers, MD as Consulting Physician (Pulmonary Disease) Alexis Frock, MD as Consulting Physician (Urology) Ander Slade, Carlisle Beers, MD as Referring Physician (Ophthalmology) OTHER MD:   CHIEF COMPLAINT: Estrogen receptor positive breast cancer  CURRENT TREATMENT: Letrozole, palbociclib   INTERVAL HISTORY: [name] returns today for follow-up of her estrogen receptor positive stage IV breast cancer.    She continues on letrozole.  She is tolerating this well.  She feels her hair is growing back some at this point.  Hot flashes and vaginal dryness are not a major issue.  She does very well with the palbociclib also. At her last visit on 07/30/2019, this was dropped to 75 mg every other day given her persistently low ANC.  She is interested in trying to get it back to daily if her counts will allow.  We are continuing to follow her tumor marker: Lab Results  Component Value Date   CA2729 38.3 08/27/2019   CA2729 35.5 07/30/2019   CA2729 44.2 (H) 07/02/2019   CA2729 34.4 06/04/2019   CA2729 28.4 05/07/2019   She is scheduled for annual mammogram on 10/17/2019.    REVIEW OF SYSTEMS: [name] has lost several pounds.  She is having a much better diet.  She is exercising by walking and she also gets on her stationary bike 2 times a day.  She wants to get off diabetic medication if possible and in any  case she wants to avoid having to go on insulin at some point in the future.  She is looking forward to her husband's 80th birthday coming up this week   BREAST CANCER HISTORY: From the original intake note:  [name] had screening mammography showing some suspicious calcifications in the right breast leading to right diagnostic mammography with ultrasonography 01/22/2016 at [facility]. The breast density was category C. In the upper right breast there was a 2.3 cm mass with additional masses measuring 0.9 and 0.7 cm. There was also a possible additional 0.8 mass in the lower inner quadrant. Ultrasound confirmed an irregular hypoechoic mass in the right breast upper outer quadrant measuring 2.0 cm. There were other masses measuring 0.7 and 0.8 cm by ultrasonography. The right axilla was sonographically benign.  Biopsy of a 12:00 and 4:00 mass in the right breast 01/22/2016 showed ([id number]) both specimens showing invasive ductal carcinoma, grade 1 or 2, both 95% estrogen receptor positive, both 95% progesterone receptor positive, both with strong staining intensity, with MIB-1 ranging from 10-15%, and both HER-2 negative, the signals ratio being 1.23-1.42, and the number per cell 1.85-2.59.  Her subsequent history is as detailed below   PAST MEDICAL HISTORY: Past Medical History:  Diagnosis Date  . Breast cancer ([place])   . Cancer ([place]) 02/2016   right breast  . DIABETES MELLITUS, TYPE II 01/04/2007   only takes actoplus daily  . Dizziness and giddiness 02/29/2008  . DVT, HX OF    at age 89 in right buttocks  . Dyspnea    due to lung cancer  .  Family history of breast cancer   . GERD 01/04/2007   pt reports resolved   . GLAUCOMA 07/30/2008   both eyes  . History of blood transfusion    no abnormal  reaction  . History of uterine cancer 2000   hysterectomy done  . HYPERLIPIDEMIA 01/04/2007   taking Pravastatin daily  . HYPERTENSION 01/04/2007   takes Lisinopril daily  . Joint pain   .  Joint swelling   . Leg cramps   . LEG PAIN, LEFT 07/06/2007  . NUMBNESS 07/30/2008   in fingers;pt states from Diamox  . OSTEOARTHRITIS, HIP 09/25/2009  . OTITIS MEDIA, ACUTE, BILATERAL 02/29/2008  . Overweight(278.02) 01/04/2007  . Peripheral vascular disease ([place])   . Personal history of radiation therapy 2018  . Pneumonia   . PONV (postoperative nausea and vomiting)   . SLEEP APNEA, OBSTRUCTIVE    doesn't use a cpap;study done about 50yr ago  . TRANSIENT ISCHEMIC ATTACK, HX OF 01/04/2007  . Vision loss    left eye    PAST SURGICAL HISTORY: Past Surgical History:  Procedure Laterality Date  . ABDOMINAL HYSTERECTOMY  2000  . BREAST LUMPECTOMY Right 01/13/2017   x2  . BREAST LUMPECTOMY WITH RADIOACTIVE SEED AND SENTINEL LYMPH NODE BIOPSY Right 01/13/2017   Procedure: RIGHT BREAST RADIOACTIVE SEED X'S 2 GUIDED LUMPECTOMY WITH RADIOACTIVE SEED TARGETED AXILLARYLYMPH NODE EXCISION AND RIGHT AXILLARY SENTINEL LYMPH NODE BIOPSY;  Surgeon: NAlphonsa Overall MD;  [facility] [facility]  Service: General;  Laterality: Right;  2 SEEDS IN RIGHT BREAST 1 SEED IN RIGHT AXILLARY NODE  . CHOLECYSTECTOMY    . ENDOBRONCHIAL ULTRASOUND Bilateral 03/28/2016   Procedure: ENDOBRONCHIAL ULTRASOUND;  Surgeon: RCollene Gobble MD;  Location: [facility];  Service: Cardiopulmonary;  Laterality: Bilateral;  . EYE SURGERY  13   shunt left and lazer eye surgery on right cataract and retenia tear with repair  . growth removal  2004   from thumb  . KNEE ARTHROSCOPY Right   . mulitple eye surgeries     both eyes, cataracts with ioc done both eyes  . OOPHORECTOMY    . right lumpectomy with axillary node dissection Right 01/2017  . TOTAL HIP ARTHROPLASTY  06/24/2011   Procedure: TOTAL HIP ARTHROPLASTY;  Surgeon: FKerin Salen  [facility]  Service: Orthopedics;  Laterality: Right;  . TOTAL HIP ARTHROPLASTY Left 11/12/2012   Dr RMayer Camel . TOTAL HIP ARTHROPLASTY Left 11/12/2012   Procedure: TOTAL HIP ARTHROPLASTY;   Surgeon: FKerin Salen MD;  [facility]  Service: Orthopedics;  Laterality: Left;  DEPUY PINNACLE    FAMILY HISTORY Family History  Problem Relation Age of Onset  . Dementia Mother   . Cancer Mother        Breast and lung cancer  . Stroke Sister   . Breast cancer Sister 527 . Heart attack Maternal Aunt   . Lung cancer Maternal Grandmother        non smoker  . Glaucoma Maternal Grandfather   . Anesthesia problems Neg Hx    The patient's father died at age 549 the patient's mother died at age 74 She had breast and lung cancers diagnosed shortly before her death. The patient had no brothers, 2 sisters. One sister was diagnosed with breast cancer at the age of 558   GYNECOLOGIC HISTORY:  No LMP recorded. Patient has had a hysterectomy. Menarche age 74 first live birth age 74 the patient is GX P1. She had a hysterectomy for endometrial  cancer in the year 2000. She did not take hormone replacement. She did use oral contraceptives for more than 20 years remotely, with no complications.   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 She is home with her husband [name]. He is a retired Dealer.Their son [name] also lives in [place].    ADVANCED DIRECTIVES: In place   HEALTH MAINTENANCE: Social History   Tobacco Use  . Smoking status: Never Smoker  . Smokeless tobacco: Never Used  Substance Use Topics  . Alcohol use: No  . Drug use: No     Colonoscopy: Never  PAP: Status post hysterectomy  Bone density: Remote   Allergies  Allergen Reactions  . Codeine Hives    Hycodan syrup  . Fluorescein Nausea And Vomiting    ? IV dye for retina specialist  . Lipitor [Atorvastatin Calcium]     Leg cramp  . Oxycodone Nausea And Vomiting    Patient vomited for 3 days after taking  . Sitagliptin Phosphate Nausea And Vomiting  . Sulfa Drugs Cross Reactors Nausea And Vomiting    Current  Outpatient Medications  Medication Sig Dispense Refill  . acetaminophen (TYLENOL) 500 MG tablet Take 1,000 mg by mouth every 4 (four) hours as needed for moderate pain or fever.    Marland Kitchen amLODipine (NORVASC) 5 MG tablet Take 1 tablet (5 mg total) by mouth daily. 90 tablet 3  . aspirin EC 81 MG tablet Take 81 mg by mouth daily at 6 PM. 1700    . brimonidine-timolol (COMBIGAN) 0.2-0.5 % ophthalmic solution Place 1 drop into both eyes three times daily    . cholecalciferol (VITAMIN D) 1000 units tablet Take 1,000 Units by mouth daily.    . dorzolamide-timolol (COSOPT) 22.3-6.8 MG/ML ophthalmic solution Place 1 drop into both eyes 2 (two) times daily.    Marland Kitchen letrozole (FEMARA) 2.5 MG tablet Take 1 tablet (2.5 mg total) by mouth at bedtime. 90 tablet 4  . lisinopril (ZESTRIL) 40 MG tablet Take 1 tablet by mouth once daily 90 tablet 0  . lovastatin (MEVACOR) 20 MG tablet TAKE 1 TABLET BY MOUTH ONCE DAILY IN THE EVENING 90 tablet 0  . metFORMIN (GLUCOPHAGE-XR) 500 MG 24 hr tablet Take 1 tablet (500 mg total) by mouth daily with breakfast. 180 tablet 3  . palbociclib (IBRANCE) 75 MG tablet TAKE 1 TABLET (75 MG TOTAL) BY MOUTH DAILY. TAKE FOR 21 DAYS ON, 7 DAYS OFF, REPEAT EVERY 28 DAYS. 21 tablet 6  . pioglitazone (ACTOS) 15 MG tablet Take 1 tablet (15 mg total) by mouth daily. 90 tablet 3   No current facility-administered medications for this visit.     OBJECTIVE: white woman in no acute distress  Vitals:   09/24/19 1525  BP: (!) 163/96  Pulse: 92  Resp: 18  Temp: 98.3 F (36.8 C)  SpO2: 95%   Wt Readings from Last 3 Encounters:  09/24/19 245 lb 14.4 oz (111.5 kg)  07/30/19 252 lb 4.8 oz (114.4 kg)  06/04/19 251 lb 4.8 oz (114 kg)   Body mass index is 42.21 kg/m.    ECOG FS:1 - Symptomatic but completely ambulatory  Sclerae unicteric, EOMs intact Wearing a mask No cervical or supraclavicular adenopathy Lungs no rales or rhonchi Heart regular rate and rhythm Abd soft, obese, nontender,  positive bowel sounds MSK no focal spinal tenderness, no upper extremity lymphedema Neuro: nonfocal, well oriented, appropriate affect  Breasts: Deferred   LAB RESULTS:  CMP     Component Value Date/Time   NA 141 09/24/2019 1455   NA 140 05/29/2017 1254   K 4.4 09/24/2019 1455   K 4.4 05/29/2017 1254   CL 107 09/24/2019 1455   CO2 25 09/24/2019 1455   CO2 25 05/29/2017 1254   GLUCOSE 154 (H) 09/24/2019 1455   GLUCOSE 154 (H) 05/29/2017 1254   BUN 10 09/24/2019 1455   BUN 11.5 05/29/2017 1254   CREATININE 0.79 09/24/2019 1455   CREATININE 0.80 04/02/2018 1116   CREATININE 0.8 05/29/2017 1254   CALCIUM 9.2 09/24/2019 1455   CALCIUM 9.3 05/29/2017 1254   PROT 6.9 09/24/2019 1455   PROT 7.0 05/29/2017 1254   ALBUMIN 3.9 09/24/2019 1455   ALBUMIN 4.1 05/29/2017 1254   AST 34 09/24/2019 1455   AST 13 (L) 04/02/2018 1116   AST 13 05/29/2017 1254   ALT 38 09/24/2019 1455   ALT 15 04/02/2018 1116   ALT 14 05/29/2017 1254   ALKPHOS 67 09/24/2019 1455   ALKPHOS 66 05/29/2017 1254   BILITOT 0.4 09/24/2019 1455   BILITOT 0.4 04/02/2018 1116   BILITOT 0.50 05/29/2017 1254   GFRNONAA >60 09/24/2019 1455   GFRNONAA >60 04/02/2018 1116   GFRAA >60 09/24/2019 1455   GFRAA >60 04/02/2018 1116    INo results found for: SPEP, UPEP  Lab Results  Component Value Date   WBC 3.5 (L) 09/24/2019   NEUTROABS 2.2 09/24/2019   HGB 11.9 (L) 09/24/2019   HCT 36.0 09/24/2019   MCV 95.0 09/24/2019   PLT 175 09/24/2019      Chemistry      Component Value Date/Time   NA 141 09/24/2019 1455   NA 140 05/29/2017 1254   K 4.4 09/24/2019 1455   K 4.4 05/29/2017 1254   CL 107 09/24/2019 1455   CO2 25 09/24/2019 1455   CO2 25 05/29/2017 1254   BUN 10 09/24/2019 1455   BUN 11.5 05/29/2017 1254   CREATININE 0.79 09/24/2019 1455   CREATININE 0.80 04/02/2018 1116   CREATININE 0.8 05/29/2017 1254      Component Value Date/Time   CALCIUM 9.2 09/24/2019 1455   CALCIUM 9.3 05/29/2017 1254     ALKPHOS 67 09/24/2019 1455   ALKPHOS 66 05/29/2017 1254   AST 34 09/24/2019 1455   AST 13 (L) 04/02/2018 1116   AST 13 05/29/2017 1254   ALT 38 09/24/2019 1455   ALT 15 04/02/2018 1116   ALT 14 05/29/2017 1254   BILITOT 0.4 09/24/2019 1455   BILITOT 0.4 04/02/2018 1116   BILITOT 0.50 05/29/2017 1254       No results found for: LABCA2  No components found for: LABCA125  No results for input(s): INR in the last 168 hours.  Urinalysis    Component Value Date/Time   COLORURINE YELLOW 08/18/2017 1322   APPEARANCEUR HAZY (A) 08/18/2017 1322   LABSPEC 1.010 08/18/2017 1322   PHURINE 7.0 08/18/2017 1322   GLUCOSEU NEGATIVE 08/18/2017 1322   GLUCOSEU NEGATIVE 10/30/2014 1513   HGBUR LARGE (A) 08/18/2017 1322   BILIRUBINUR NEGATIVE 08/18/2017 1322   KETONESUR NEGATIVE 08/18/2017 1322   PROTEINUR 30 (A) 08/18/2017 1322   UROBILINOGEN 0.2 10/30/2014 1513   NITRITE NEGATIVE 08/18/2017 1322   LEUKOCYTESUR LARGE (A) 08/18/2017 1322    STUDIES: No results found.    ELIGIBLE FOR AVAILABLE RESEARCH PROTOCOL: no  ASSESSMENT: 74 y.o. [place] woman with a remote history of early stage endometrial  cancer, subsequently status post right breast upper outer quadrant biopsy 01/22/2016 for a clinically multifocal T2 N0, stage 2A invasive ductal carcinoma, grade 1, estrogen and progesterone receptor positive, HER-2 negative, with an MIB-1 between 10 and 15%.  (1) right axillary lymph node biopsy 03/02/2016 positive  (2) genetics testing 01/13/2016 through the Custom gene panel offered by [facility] found no deleterious mutations in  ATM, BARD1, BRCA1, BRCA2, BRIP1, CDH1, CHEK2, EPCAM, FANCC, MLH1, MSH2, MSH6, MUTYH, NBN, PALB2, PMS2, POLD1, PTEN, RAD51C, RAD51D, TP53, and XRCC2  METASTATIC DISEASE: OCT 2017 (3) CT scans of the chest abdomen and pelvis obtained 03/10/2016 are consistent with bilateral lung metastases and mediastinal and hilar nodal involvement, but no liver or bone  spread  (a) bronchoscopic lymph node biopsy 2 (station 7, 13R) 03/28/2016 confirms metastatic adenocarcinoma, estrogen receptor positive, HER-2 not amplified  (b) baseline CA-27-29 on 04/18/2016 was 137.5.  (4) letrozole started 03/15/2016, palbociclib added 03/29/2016 at 125 mg/day, 21/7  (a) dose decreased to 100 mg per day, 21/7, beginning with February cycle  (b) palbociclib held 01/31/2017, with increasing symptoms  (c) palbociclib resumed October 2018 at 75 mg daily  (d) palbociclib dose reduced to 75 mg every other day February through April 2019  (e) palbociclib dose resumed at 75 mg daily as of 10/17/2017  (f) palbociclib dose decreased to 75 mg every other day beginning 07/31/2019  (g) palbociclib resumed at 75 mg daily, 21 days on 7 off, as of 08/25/2019  (5) status post double right lumpectomies and right axillary lymph node sampling 01/13/2017 for 2 separate invasive ductal carcinoma lesions, pT1a and pT1b, N1a, with negative margins, both lesions being estrogen and progesterone receptor positive and HER-2 negative  (6) adjuvant radiation completed 07/05/2017 1. 50.4 Gy in 28 fractions to the right breast and supraclavicular region using whole-breast tangent fields. 2. Boost to the seroma delivered an additional 10 Gy in 5 fractions. The total dose was 60.4 Gy.  (7) restaging studies:  (a) CT scan of the chest and bone scan 06/23/2017 showed stable scattered very small lung nodules, no bone lesions  (b) CT of the chest 10/17/2017 showed no new or progressive metastatic disease in the chest. The small left lower lobe pulmonary nodule is stable  (c) PET scan on 03/02/2018: shows no findings for residual or recurrent right breast cancer   (8) right upper pole renal lesion noted to be enlarging on CT scan 06/22/2017  (a) no uptake on PET scan obtained 03/02/2018   PLAN: Shannell is now 3-1/2 years out from definitive diagnosis of metastatic breast cancer.  Her disease is very well  controlled.  She is tolerating her treatment well.  This is very favorable.  I am delighted that she has decided to take her health on hand.  She has a much better diet and much better exercise program.  I am sure if she continues it she can obtain her goal, which is to if possible, off diabetic medication.  Her counts have recovered significantly and I think we can go back to daily palbociclib, 21 on and 7 off as before.  She is having significant problems with access.  We are going to do fingerstick CBCs monthly and every 3 months she will have the full set of labs by phlebotomy.  This means that we will check the CA 27-29 only every 3 months.  She will see me again in July.  She knows to call for any other issue that may develop before then  Total  encounter time 30 minutes.*   Henslee Lottman, Virgie Dad, MD  09/24/19 5:46 PM Medical Oncology and Hematology [facility] [place], [place] [address] Tel. [phone]    Fax. [phone]   I, Wilburn Mylar, am acting as scribe for Dr. Virgie Dad. Shama Monfils.  I, Lurline Del MD, have reviewed the above documentation for accuracy and completeness, and I agree with the above.   *Total Encounter Time as defined by the Centers for Medicare and Medicaid Services includes, in addition to the face-to-face time of a patient visit (documented in the note above) non-face-to-face time: obtaining and reviewing outside history, ordering and reviewing medications, tests or procedures, care coordination (communications with other health care professionals or caregivers) and documentation in the medical record.

## 2019-09-24 ENCOUNTER — Other Ambulatory Visit: Payer: Self-pay | Admitting: Oncology

## 2019-09-24 ENCOUNTER — Other Ambulatory Visit: Payer: Medicare Other

## 2019-09-24 ENCOUNTER — Inpatient Hospital Stay: Payer: Medicare Other | Attending: Oncology | Admitting: Oncology

## 2019-09-24 ENCOUNTER — Inpatient Hospital Stay: Payer: Medicare Other

## 2019-09-24 ENCOUNTER — Other Ambulatory Visit: Payer: Self-pay

## 2019-09-24 VITALS — BP 163/96 | HR 92 | Temp 98.3°F | Resp 18 | Ht 64.0 in | Wt 245.9 lb

## 2019-09-24 DIAGNOSIS — Z8542 Personal history of malignant neoplasm of other parts of uterus: Secondary | ICD-10-CM | POA: Insufficient documentation

## 2019-09-24 DIAGNOSIS — Z79899 Other long term (current) drug therapy: Secondary | ICD-10-CM | POA: Diagnosis not present

## 2019-09-24 DIAGNOSIS — Z8249 Family history of ischemic heart disease and other diseases of the circulatory system: Secondary | ICD-10-CM | POA: Diagnosis not present

## 2019-09-24 DIAGNOSIS — Z90721 Acquired absence of ovaries, unilateral: Secondary | ICD-10-CM | POA: Insufficient documentation

## 2019-09-24 DIAGNOSIS — E119 Type 2 diabetes mellitus without complications: Secondary | ICD-10-CM | POA: Insufficient documentation

## 2019-09-24 DIAGNOSIS — Z8349 Family history of other endocrine, nutritional and metabolic diseases: Secondary | ICD-10-CM | POA: Diagnosis not present

## 2019-09-24 DIAGNOSIS — C7800 Secondary malignant neoplasm of unspecified lung: Secondary | ICD-10-CM | POA: Diagnosis not present

## 2019-09-24 DIAGNOSIS — Z9079 Acquired absence of other genital organ(s): Secondary | ICD-10-CM | POA: Insufficient documentation

## 2019-09-24 DIAGNOSIS — Z86718 Personal history of other venous thrombosis and embolism: Secondary | ICD-10-CM | POA: Insufficient documentation

## 2019-09-24 DIAGNOSIS — G620 Drug-induced polyneuropathy: Secondary | ICD-10-CM | POA: Diagnosis not present

## 2019-09-24 DIAGNOSIS — K760 Fatty (change of) liver, not elsewhere classified: Secondary | ICD-10-CM

## 2019-09-24 DIAGNOSIS — T451X5A Adverse effect of antineoplastic and immunosuppressive drugs, initial encounter: Secondary | ICD-10-CM | POA: Diagnosis not present

## 2019-09-24 DIAGNOSIS — I10 Essential (primary) hypertension: Secondary | ICD-10-CM | POA: Insufficient documentation

## 2019-09-24 DIAGNOSIS — Z7982 Long term (current) use of aspirin: Secondary | ICD-10-CM | POA: Insufficient documentation

## 2019-09-24 DIAGNOSIS — Z7984 Long term (current) use of oral hypoglycemic drugs: Secondary | ICD-10-CM | POA: Diagnosis not present

## 2019-09-24 DIAGNOSIS — C7801 Secondary malignant neoplasm of right lung: Secondary | ICD-10-CM

## 2019-09-24 DIAGNOSIS — Z923 Personal history of irradiation: Secondary | ICD-10-CM | POA: Diagnosis not present

## 2019-09-24 DIAGNOSIS — Z801 Family history of malignant neoplasm of trachea, bronchus and lung: Secondary | ICD-10-CM | POA: Diagnosis not present

## 2019-09-24 DIAGNOSIS — Z79811 Long term (current) use of aromatase inhibitors: Secondary | ICD-10-CM | POA: Diagnosis not present

## 2019-09-24 DIAGNOSIS — Z90710 Acquired absence of both cervix and uterus: Secondary | ICD-10-CM | POA: Diagnosis not present

## 2019-09-24 DIAGNOSIS — Z170 Estrogen receptor positive status [ER+]: Secondary | ICD-10-CM

## 2019-09-24 DIAGNOSIS — Z803 Family history of malignant neoplasm of breast: Secondary | ICD-10-CM | POA: Diagnosis not present

## 2019-09-24 DIAGNOSIS — Z9221 Personal history of antineoplastic chemotherapy: Secondary | ICD-10-CM | POA: Diagnosis not present

## 2019-09-24 DIAGNOSIS — C50411 Malignant neoplasm of upper-outer quadrant of right female breast: Secondary | ICD-10-CM

## 2019-09-24 DIAGNOSIS — Z7189 Other specified counseling: Secondary | ICD-10-CM

## 2019-09-24 DIAGNOSIS — C773 Secondary and unspecified malignant neoplasm of axilla and upper limb lymph nodes: Secondary | ICD-10-CM | POA: Diagnosis not present

## 2019-09-24 LAB — CBC WITH DIFFERENTIAL (CANCER CENTER ONLY)
Abs Immature Granulocytes: 0.03 10*3/uL (ref 0.00–0.07)
Basophils Absolute: 0 10*3/uL (ref 0.0–0.1)
Basophils Relative: 1 %
Eosinophils Absolute: 0 10*3/uL (ref 0.0–0.5)
Eosinophils Relative: 1 %
HCT: 36 % (ref 36.0–46.0)
Hemoglobin: 11.9 g/dL — ABNORMAL LOW (ref 12.0–15.0)
Immature Granulocytes: 1 %
Lymphocytes Relative: 22 %
Lymphs Abs: 0.8 10*3/uL (ref 0.7–4.0)
MCH: 31.4 pg (ref 26.0–34.0)
MCHC: 33.1 g/dL (ref 30.0–36.0)
MCV: 95 fL (ref 80.0–100.0)
Monocytes Absolute: 0.4 10*3/uL (ref 0.1–1.0)
Monocytes Relative: 11 %
Neutro Abs: 2.2 10*3/uL (ref 1.7–7.7)
Neutrophils Relative %: 64 %
Platelet Count: 175 10*3/uL (ref 150–400)
RBC: 3.79 MIL/uL — ABNORMAL LOW (ref 3.87–5.11)
RDW: 14.2 % (ref 11.5–15.5)
WBC Count: 3.5 10*3/uL — ABNORMAL LOW (ref 4.0–10.5)
nRBC: 0 % (ref 0.0–0.2)

## 2019-09-24 LAB — COMPREHENSIVE METABOLIC PANEL
ALT: 38 U/L (ref 0–44)
AST: 34 U/L (ref 15–41)
Albumin: 3.9 g/dL (ref 3.5–5.0)
Alkaline Phosphatase: 67 U/L (ref 38–126)
Anion gap: 9 (ref 5–15)
BUN: 10 mg/dL (ref 8–23)
CO2: 25 mmol/L (ref 22–32)
Calcium: 9.2 mg/dL (ref 8.9–10.3)
Chloride: 107 mmol/L (ref 98–111)
Creatinine, Ser: 0.79 mg/dL (ref 0.44–1.00)
GFR calc Af Amer: >60 mL/min (ref >60)
GFR calc non Af Amer: >60 mL/min (ref >60)
Glucose, Bld: 154 mg/dL — ABNORMAL HIGH (ref 70–99)
Potassium: 4.4 mmol/L (ref 3.5–5.1)
Sodium: 141 mmol/L (ref 135–145)
Total Bilirubin: 0.4 mg/dL (ref 0.3–1.2)
Total Protein: 6.9 g/dL (ref 6.5–8.1)

## 2019-09-24 MED ORDER — PALBOCICLIB 75 MG PO TABS: ORAL_TABLET | ORAL | 6 refills | Status: DC

## 2019-09-24 MED ORDER — LETROZOLE 2.5 MG PO TABS: 2.5000 mg | ORAL_TABLET | Freq: Every day | ORAL | 4 refills | Status: DC

## 2019-09-24 MED FILL — IBRANCE 75 MG TABS: 75 | 28 days supply | Qty: 21 | Fill #1

## 2019-09-25 ENCOUNTER — Telehealth: Payer: Self-pay | Admitting: Oncology

## 2019-09-25 LAB — CANCER ANTIGEN 27.29: CA 27.29: 31.8 U/mL (ref 0.0–38.6)

## 2019-09-25 NOTE — Telephone Encounter (Signed)
Scheduled appts per 4/13 los. Pt confirmed appt date and time.  

## 2019-10-16 DIAGNOSIS — H20013 Primary iridocyclitis, bilateral: Secondary | ICD-10-CM | POA: Diagnosis not present

## 2019-10-16 DIAGNOSIS — H35351 Cystoid macular degeneration, right eye: Secondary | ICD-10-CM | POA: Diagnosis not present

## 2019-10-16 DIAGNOSIS — H353132 Nonexudative age-related macular degeneration, bilateral, intermediate dry stage: Secondary | ICD-10-CM | POA: Diagnosis not present

## 2019-10-16 DIAGNOSIS — E119 Type 2 diabetes mellitus without complications: Secondary | ICD-10-CM | POA: Diagnosis not present

## 2019-10-17 ENCOUNTER — Ambulatory Visit
Admission: RE | Admit: 2019-10-17 | Discharge: 2019-10-17 | Disposition: A | Discharge status: Home / Self Care | Payer: Medicare Other | Source: Ambulatory Visit | Attending: Oncology | Admitting: Oncology

## 2019-10-17 ENCOUNTER — Other Ambulatory Visit: Payer: Self-pay

## 2019-10-17 DIAGNOSIS — Z9889 Other specified postprocedural states: Secondary | ICD-10-CM

## 2019-10-17 DIAGNOSIS — R922 Inconclusive mammogram: Secondary | ICD-10-CM | POA: Diagnosis not present

## 2019-10-17 DIAGNOSIS — Z853 Personal history of malignant neoplasm of breast: Secondary | ICD-10-CM | POA: Diagnosis not present

## 2019-10-22 ENCOUNTER — Other Ambulatory Visit: Payer: Self-pay

## 2019-10-22 ENCOUNTER — Inpatient Hospital Stay: Payer: Medicare Other | Attending: Oncology

## 2019-10-22 DIAGNOSIS — C773 Secondary and unspecified malignant neoplasm of axilla and upper limb lymph nodes: Secondary | ICD-10-CM | POA: Insufficient documentation

## 2019-10-22 DIAGNOSIS — Z170 Estrogen receptor positive status [ER+]: Secondary | ICD-10-CM | POA: Diagnosis not present

## 2019-10-22 DIAGNOSIS — C50411 Malignant neoplasm of upper-outer quadrant of right female breast: Secondary | ICD-10-CM | POA: Diagnosis not present

## 2019-10-22 DIAGNOSIS — Z79811 Long term (current) use of aromatase inhibitors: Secondary | ICD-10-CM | POA: Insufficient documentation

## 2019-10-22 LAB — CBC WITH DIFFERENTIAL (CANCER CENTER ONLY)
Abs Immature Granulocytes: 0.02 10*3/uL (ref 0.00–0.07)
Basophils Absolute: 0 10*3/uL (ref 0.0–0.1)
Basophils Relative: 1 %
Eosinophils Absolute: 0 10*3/uL (ref 0.0–0.5)
Eosinophils Relative: 1 %
HCT: 35.4 % — ABNORMAL LOW (ref 36.0–46.0)
Hemoglobin: 11.9 g/dL — ABNORMAL LOW (ref 12.0–15.0)
Immature Granulocytes: 1 %
Lymphocytes Relative: 40 %
Lymphs Abs: 0.8 10*3/uL (ref 0.7–4.0)
MCH: 31.2 pg (ref 26.0–34.0)
MCHC: 33.6 g/dL (ref 30.0–36.0)
MCV: 92.9 fL (ref 80.0–100.0)
Monocytes Absolute: 0.2 10*3/uL (ref 0.1–1.0)
Monocytes Relative: 11 %
Neutro Abs: 1 10*3/uL — ABNORMAL LOW (ref 1.7–7.7)
Neutrophils Relative %: 46 %
Platelet Count: 140 10*3/uL — ABNORMAL LOW (ref 150–400)
RBC: 3.81 MIL/uL — ABNORMAL LOW (ref 3.87–5.11)
RDW: 15.9 % — ABNORMAL HIGH (ref 11.5–15.5)
WBC Count: 2.1 10*3/uL — ABNORMAL LOW (ref 4.0–10.5)
nRBC: 0 % (ref 0.0–0.2)

## 2019-10-30 DIAGNOSIS — H20013 Primary iridocyclitis, bilateral: Secondary | ICD-10-CM | POA: Diagnosis not present

## 2019-11-03 IMAGING — [facility] DIGITAL DIAGNOSTIC BILATERAL MAMMOGRAM WITH TOMO AND CAD
6 of 9 series · 6 of 25 positions shown · non-contrast
Comparison: Previous exam(s).

CLINICAL DATA: 72-year-old female presenting for routine annual
surveillance status post right breast lumpectomy x2 in 9045.

EXAM:
DIGITAL DIAGNOSTIC BILATERAL MAMMOGRAM WITH CAD AND TOMO

[R CC]
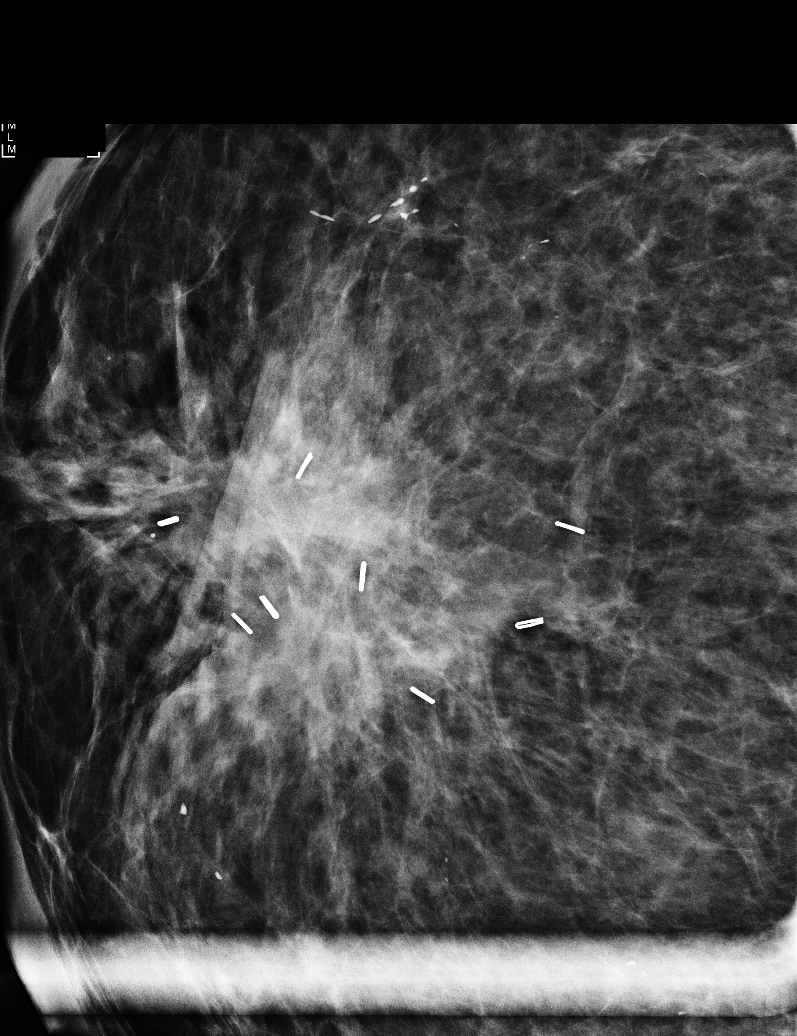

[R MLO synth-2D]
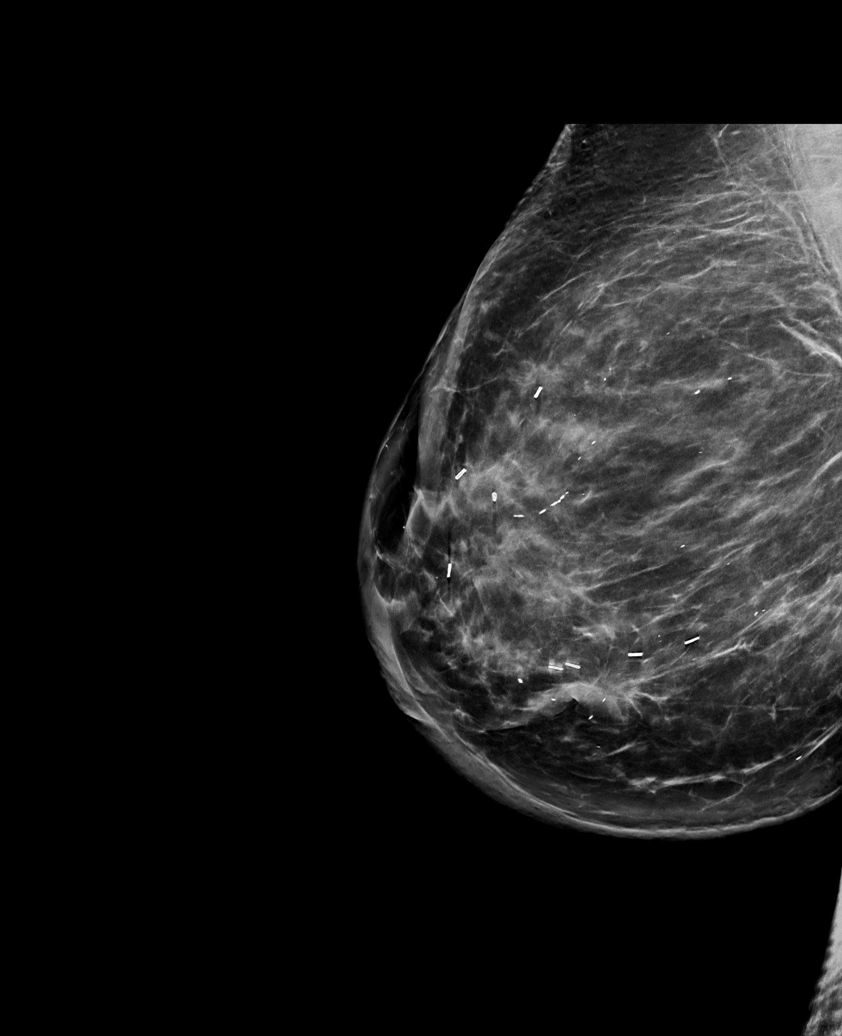

[R CC synth-2D]
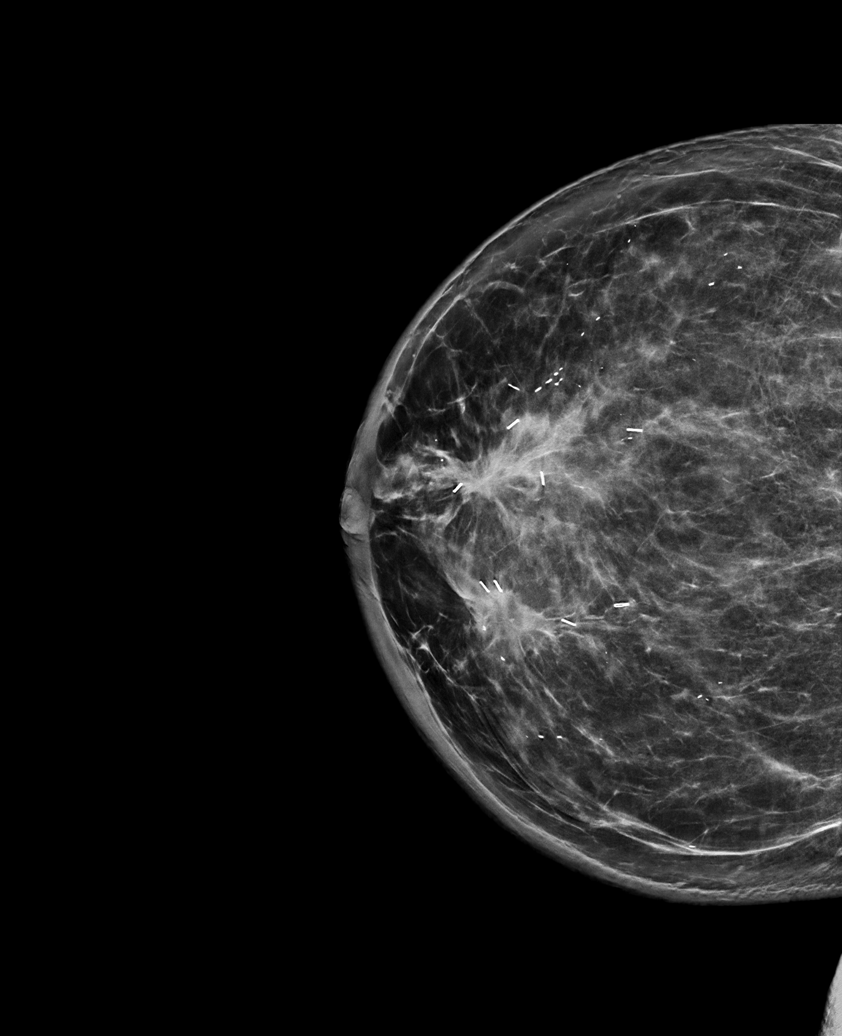

[L MLO synth-2D]
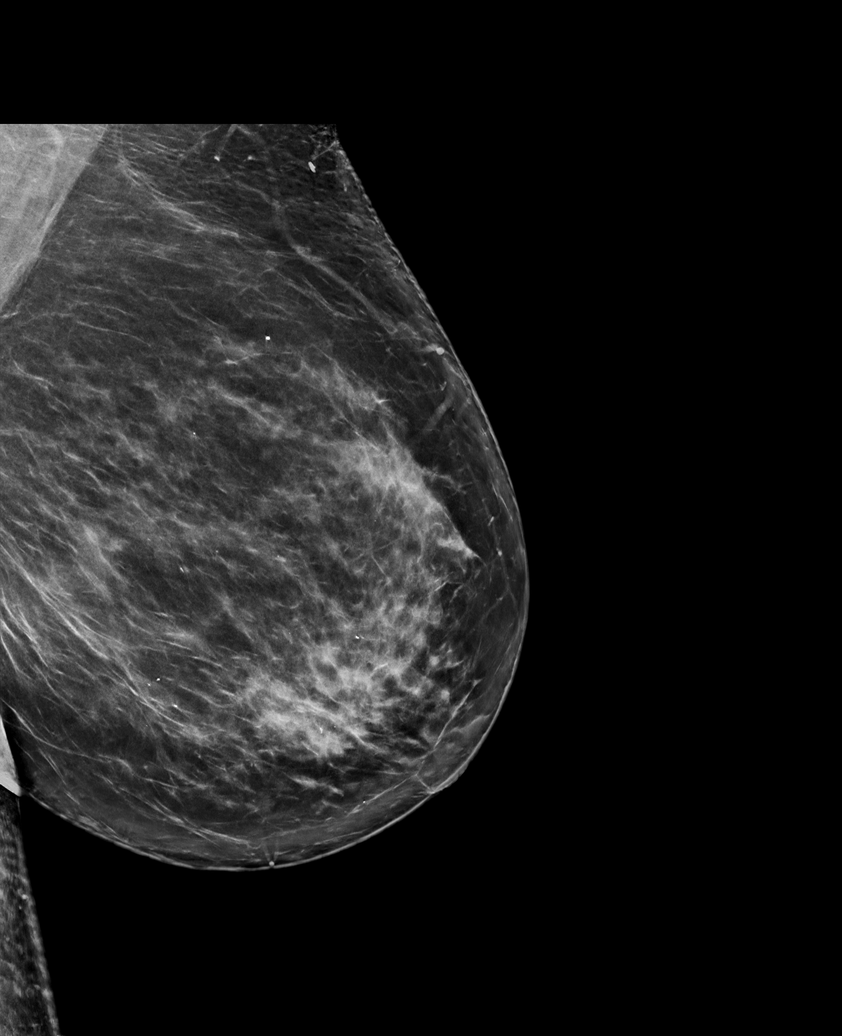

[L CC synth-2D]
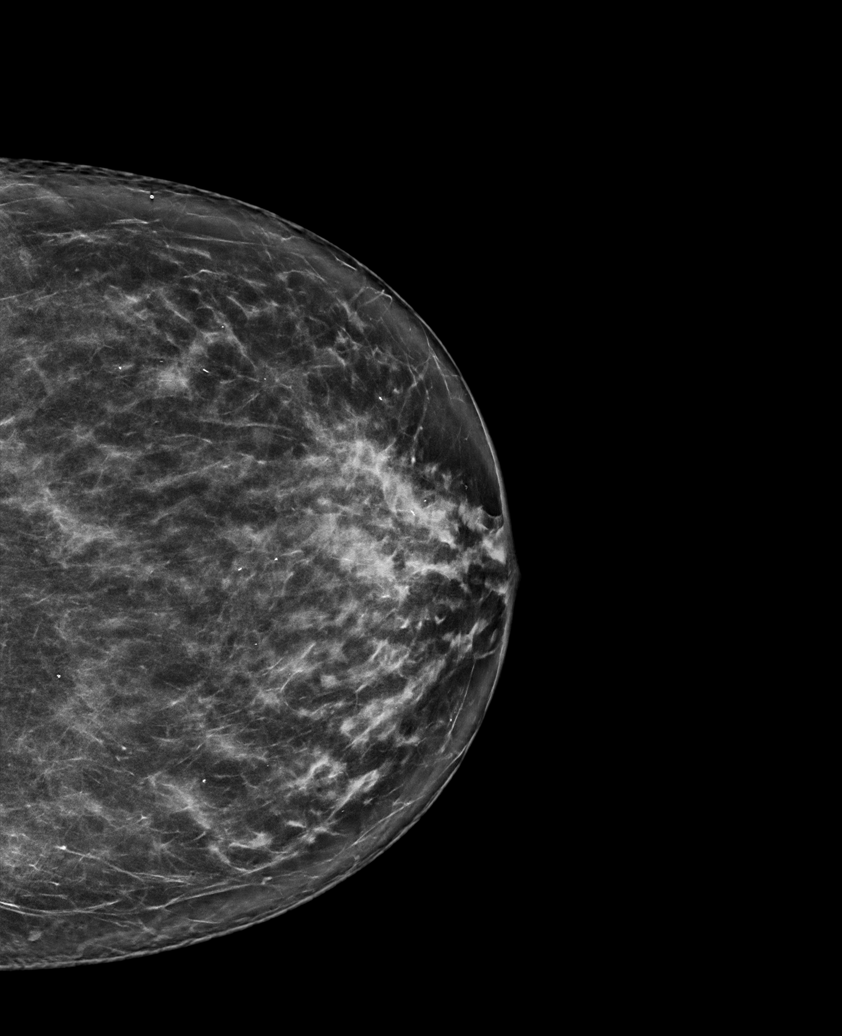

[R CC tomo · tomo slice 44/87.0]
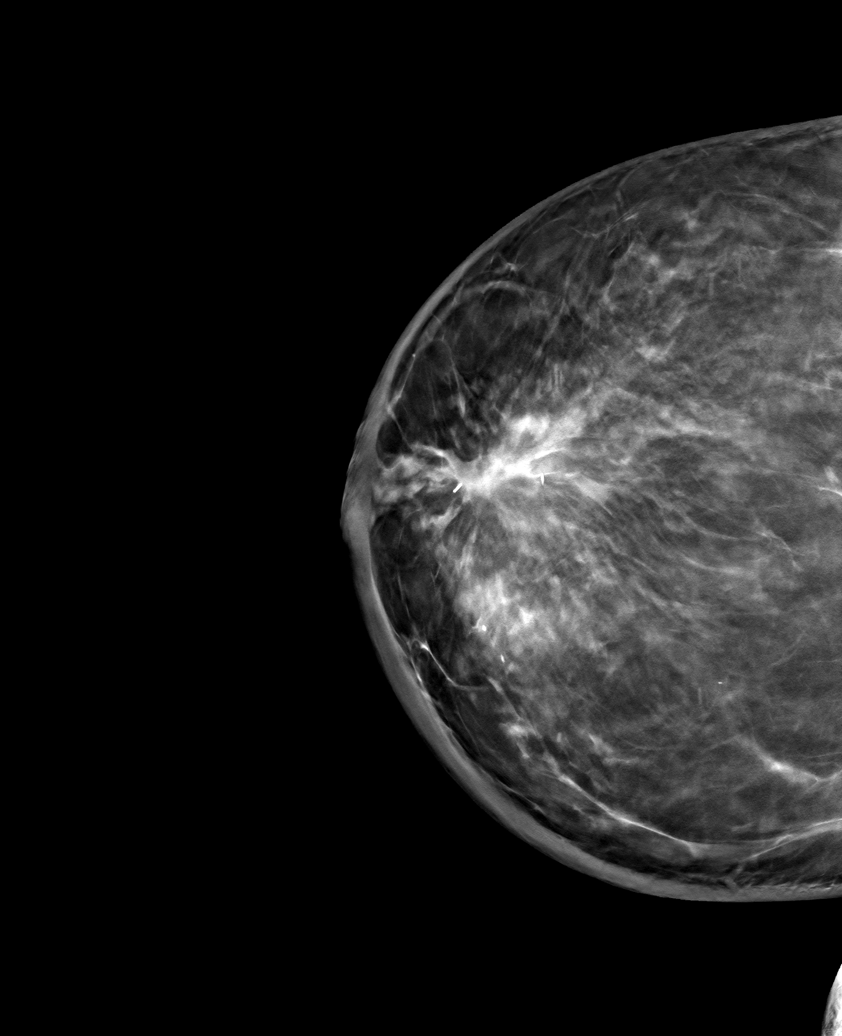

[6 of 25 positions shown; findings below may reference images not displayed]

ACR Breast Density Category c: The breast tissue is heterogeneously
dense, which may obscure small masses.
FINDINGS: The lumpectomy sites in the right breast are stable. No suspicious
calcifications, masses or areas of distortion are seen in the
bilateral breasts.

Mammographic images were processed with CAD.
IMPRESSION: Stable right breast lumpectomy sites. No mammographic evidence of
malignancy in the bilateral breasts.

RECOMMENDATION:
Diagnostic mammogram is suggested in 1 year. (Code:VQ-M-929)

I have discussed the findings and recommendations with the patient.
Results were also provided in writing at the conclusion of the
visit. If applicable, a reminder letter will be sent to the patient
regarding the next appointment.

BI-RADS CATEGORY  2: Benign.

## 2019-11-19 ENCOUNTER — Inpatient Hospital Stay: Payer: Medicare Other | Attending: Oncology

## 2019-11-19 ENCOUNTER — Other Ambulatory Visit: Payer: Self-pay

## 2019-11-19 DIAGNOSIS — Z8542 Personal history of malignant neoplasm of other parts of uterus: Secondary | ICD-10-CM | POA: Diagnosis not present

## 2019-11-19 DIAGNOSIS — C773 Secondary and unspecified malignant neoplasm of axilla and upper limb lymph nodes: Secondary | ICD-10-CM | POA: Diagnosis present

## 2019-11-19 DIAGNOSIS — E119 Type 2 diabetes mellitus without complications: Secondary | ICD-10-CM | POA: Insufficient documentation

## 2019-11-19 DIAGNOSIS — Z7982 Long term (current) use of aspirin: Secondary | ICD-10-CM | POA: Diagnosis not present

## 2019-11-19 DIAGNOSIS — G620 Drug-induced polyneuropathy: Secondary | ICD-10-CM

## 2019-11-19 DIAGNOSIS — C50411 Malignant neoplasm of upper-outer quadrant of right female breast: Secondary | ICD-10-CM

## 2019-11-19 DIAGNOSIS — Z79811 Long term (current) use of aromatase inhibitors: Secondary | ICD-10-CM | POA: Insufficient documentation

## 2019-11-19 DIAGNOSIS — C7800 Secondary malignant neoplasm of unspecified lung: Secondary | ICD-10-CM

## 2019-11-19 DIAGNOSIS — Z803 Family history of malignant neoplasm of breast: Secondary | ICD-10-CM | POA: Diagnosis not present

## 2019-11-19 DIAGNOSIS — Z90710 Acquired absence of both cervix and uterus: Secondary | ICD-10-CM | POA: Insufficient documentation

## 2019-11-19 DIAGNOSIS — I10 Essential (primary) hypertension: Secondary | ICD-10-CM | POA: Insufficient documentation

## 2019-11-19 DIAGNOSIS — Z79899 Other long term (current) drug therapy: Secondary | ICD-10-CM | POA: Diagnosis not present

## 2019-11-19 DIAGNOSIS — T451X5A Adverse effect of antineoplastic and immunosuppressive drugs, initial encounter: Secondary | ICD-10-CM

## 2019-11-19 DIAGNOSIS — Z170 Estrogen receptor positive status [ER+]: Secondary | ICD-10-CM | POA: Diagnosis not present

## 2019-11-19 DIAGNOSIS — Z7189 Other specified counseling: Secondary | ICD-10-CM

## 2019-11-19 LAB — COMPREHENSIVE METABOLIC PANEL
ALT: 16 U/L (ref 0–44)
AST: 16 U/L (ref 15–41)
Albumin: 3.9 g/dL (ref 3.5–5.0)
Alkaline Phosphatase: 66 U/L (ref 38–126)
Anion gap: 11 (ref 5–15)
BUN: 11 mg/dL (ref 8–23)
CO2: 23 mmol/L (ref 22–32)
Calcium: 9.2 mg/dL (ref 8.9–10.3)
Chloride: 108 mmol/L (ref 98–111)
Creatinine, Ser: 0.85 mg/dL (ref 0.44–1.00)
GFR calc Af Amer: >60 mL/min (ref >60)
GFR calc non Af Amer: >60 mL/min (ref >60)
Glucose, Bld: 147 mg/dL — ABNORMAL HIGH (ref 70–99)
Potassium: 4.3 mmol/L (ref 3.5–5.1)
Sodium: 142 mmol/L (ref 135–145)
Total Bilirubin: 0.5 mg/dL (ref 0.3–1.2)
Total Protein: 7 g/dL (ref 6.5–8.1)

## 2019-11-19 LAB — CBC WITH DIFFERENTIAL (CANCER CENTER ONLY)
Abs Immature Granulocytes: 0.01 10*3/uL (ref 0.00–0.07)
Basophils Absolute: 0 10*3/uL (ref 0.0–0.1)
Basophils Relative: 1 %
Eosinophils Absolute: 0 10*3/uL (ref 0.0–0.5)
Eosinophils Relative: 1 %
HCT: 35.4 % — ABNORMAL LOW (ref 36.0–46.0)
Hemoglobin: 11.8 g/dL — ABNORMAL LOW (ref 12.0–15.0)
Immature Granulocytes: 1 %
Lymphocytes Relative: 32 %
Lymphs Abs: 0.7 10*3/uL (ref 0.7–4.0)
MCH: 31.3 pg (ref 26.0–34.0)
MCHC: 33.3 g/dL (ref 30.0–36.0)
MCV: 93.9 fL (ref 80.0–100.0)
Monocytes Absolute: 0.3 10*3/uL (ref 0.1–1.0)
Monocytes Relative: 12 %
Neutro Abs: 1.2 10*3/uL — ABNORMAL LOW (ref 1.7–7.7)
Neutrophils Relative %: 53 %
Platelet Count: 128 10*3/uL — ABNORMAL LOW (ref 150–400)
RBC: 3.77 MIL/uL — ABNORMAL LOW (ref 3.87–5.11)
RDW: 16.4 % — ABNORMAL HIGH (ref 11.5–15.5)
WBC Count: 2.2 10*3/uL — ABNORMAL LOW (ref 4.0–10.5)
nRBC: 0 % (ref 0.0–0.2)

## 2019-11-20 LAB — CANCER ANTIGEN 27.29: CA 27.29: 37.6 U/mL (ref 0.0–38.6)

## 2019-12-07 ENCOUNTER — Other Ambulatory Visit: Payer: Self-pay | Admitting: Internal Medicine

## 2019-12-07 NOTE — Telephone Encounter (Signed)
Please refill as per office routine med refill policy (all routine meds refilled for 3 mo or monthly per pt preference up to one year from last visit, then month to month grace period for 3 mo, then further med refills will have to be denied)  

## 2019-12-16 MED FILL — IBRANCE 75 MG TABS: 75 | 28 days supply | Qty: 21 | Fill #4

## 2019-12-16 NOTE — Progress Notes (Signed)
[place]  [phone] [phone]     ID: [name] DOB: [birth year]  MR#: [id number]  GLO#:[id number]  Patient Care Team: Biagio Borg, MD as PCP - General (Internal Medicine) Alphonsa Overall, MD as Consulting Physician (General Surgery) Dietrich Ke, Virgie Dad, MD as Consulting Physician (Oncology) Kyung Rudd, MD as Consulting Physician (Radiation Oncology) Bobbye Charleston, MD as Consulting Physician (Obstetrics and Gynecology) Nada Libman, MD as Referring Physician (Specialist) Vevelyn Royals, MD as Consulting Physician (Ophthalmology) Lamonte Sakai Rose Fillers, MD as Consulting Physician (Pulmonary Disease) Alexis Frock, MD as Consulting Physician (Urology) Ander Slade, Carlisle Beers, MD as Referring Physician (Ophthalmology) OTHER MD:   CHIEF COMPLAINT: Estrogen receptor positive breast cancer  CURRENT TREATMENT: Letrozole, palbociclib   INTERVAL HISTORY: [name] returns today for follow-up of her estrogen receptor positive stage IV breast cancer.    She continues on letrozole.  She is tolerating this well.  She feels her hair is growing back.  Hot flashes are not an issue.  She does very well with the palbociclib also. She is back to taking this daily, after experiencing low counts in 08/2019.  She has no symptoms related to this that she is aware of.  Since her last visit, she underwent bilateral diagnostic mammography with tomography at [facility] on 10/17/2019 showing: breast density category C; no evidence of malignancy in either breast.   We are continuing to follow her tumor marker: Lab Results  Component Value Date   CA2729 37.6 11/19/2019   CA2729 31.8 09/24/2019   CA2729 38.3 08/27/2019   CA2729 35.5 07/30/2019   CA2729 44.2 (H) 07/02/2019    REVIEW OF SYSTEMS: [name] has very poor access on the months when we only do a CBC she should have only a fingerstick.  I have the lab instruction explaining this.  Today of course she  had additional labs so they had to do venipuncture.  She is watching what she eats and eating less and this is resulting in weight loss and also much better sugar control.  She was shocked when her sugar went up to 800 and she made about to herself that she was going to control lab.  She is doing a very good job so far.  When she is not doing yet is exercising.  Today we talked about her walking.  She can go 5 minutes in 1 direction and then turn around and do 5 minutes the other direction.  I think if she does that that will complement her at times diet control.  She tells me her uveitis has been active and she is on drops through her eye doctor in [place].  They burn when she puts them in but she thinks it may be helping.  Aside from these issues a detailed review of systems today was stable   BREAST CANCER HISTORY: From the original intake note:  [name] had screening mammography showing some suspicious calcifications in the right breast leading to right diagnostic mammography with ultrasonography 01/22/2016 at [facility]. The breast density was category C. In the upper right breast there was a 2.3 cm mass with additional masses measuring 0.9 and 0.7 cm. There was also a possible additional 0.8 mass in the lower inner quadrant. Ultrasound confirmed an irregular hypoechoic mass in the right breast upper outer quadrant measuring 2.0 cm. There were other masses measuring 0.7 and 0.8 cm by ultrasonography. The right axilla was sonographically benign.  Biopsy of a 12:00 and 4:00 mass in the right breast  01/22/2016 showed ([id number]) both specimens showing invasive ductal carcinoma, grade 1 or 2, both 95% estrogen receptor positive, both 95% progesterone receptor positive, both with strong staining intensity, with MIB-1 ranging from 10-15%, and both HER-2 negative, the signals ratio being 1.23-1.42, and the number per cell 1.85-2.59.  Her subsequent history is as detailed below   PAST MEDICAL HISTORY: Past  Medical History:  Diagnosis Date  . Breast cancer ([place])   . Cancer ([place]) 02/2016   right breast  . DIABETES MELLITUS, TYPE II 01/04/2007   only takes actoplus daily  . Dizziness and giddiness 02/29/2008  . DVT, HX OF    at age 64 in right buttocks  . Dyspnea    due to lung cancer  . Family history of breast cancer   . GERD 01/04/2007   pt reports resolved   . GLAUCOMA 07/30/2008   both eyes  . History of blood transfusion    no abnormal  reaction  . History of uterine cancer 2000   hysterectomy done  . HYPERLIPIDEMIA 01/04/2007   taking Pravastatin daily  . HYPERTENSION 01/04/2007   takes Lisinopril daily  . Joint pain   . Joint swelling   . Leg cramps   . LEG PAIN, LEFT 07/06/2007  . NUMBNESS 07/30/2008   in fingers;pt states from Diamox  . OSTEOARTHRITIS, HIP 09/25/2009  . OTITIS MEDIA, ACUTE, BILATERAL 02/29/2008  . Overweight(278.02) 01/04/2007  . Peripheral vascular disease ([place])   . Personal history of radiation therapy 2018  . Pneumonia   . PONV (postoperative nausea and vomiting)   . SLEEP APNEA, OBSTRUCTIVE    doesn't use a cpap;study done about 68yr ago  . TRANSIENT ISCHEMIC ATTACK, HX OF 01/04/2007  . Vision loss    left eye    PAST SURGICAL HISTORY: Past Surgical History:  Procedure Laterality Date  . ABDOMINAL HYSTERECTOMY  2000  . BREAST LUMPECTOMY Right 01/13/2017   x2  . BREAST LUMPECTOMY WITH RADIOACTIVE SEED AND SENTINEL LYMPH NODE BIOPSY Right 01/13/2017   Procedure: RIGHT BREAST RADIOACTIVE SEED X'S 2 GUIDED LUMPECTOMY WITH RADIOACTIVE SEED TARGETED AXILLARYLYMPH NODE EXCISION AND RIGHT AXILLARY SENTINEL LYMPH NODE BIOPSY;  Surgeon: NAlphonsa Overall MD;  [facility]  Service: General;  Laterality: Right;  2 SEEDS IN RIGHT BREAST 1 SEED IN RIGHT AXILLARY NODE  . CHOLECYSTECTOMY    . ENDOBRONCHIAL ULTRASOUND Bilateral 03/28/2016   Procedure: ENDOBRONCHIAL ULTRASOUND;  Surgeon: RCollene Gobble MD;  Location: [facility];  Service: Cardiopulmonary;   Laterality: Bilateral;  . EYE SURGERY  13   shunt left and lazer eye surgery on right cataract and retenia tear with repair  . growth removal  2004   from thumb  . KNEE ARTHROSCOPY Right   . mulitple eye surgeries     both eyes, cataracts with ioc done both eyes  . OOPHORECTOMY    . right lumpectomy with axillary node dissection Right 01/2017  . TOTAL HIP ARTHROPLASTY  06/24/2011   Procedure: TOTAL HIP ARTHROPLASTY;  Surgeon: FKerin Salen  [facility]  Service: Orthopedics;  Laterality: Right;  . TOTAL HIP ARTHROPLASTY Left 11/12/2012   Dr RMayer Camel . TOTAL HIP ARTHROPLASTY Left 11/12/2012   Procedure: TOTAL HIP ARTHROPLASTY;  Surgeon: FKerin Salen MD;  [facility]  Service: Orthopedics;  Laterality: Left;  DEPUY PINNACLE    FAMILY HISTORY Family History  Problem Relation Age of Onset  . Dementia Mother   . Cancer Mother  Breast and lung cancer  . Stroke Sister   . Breast cancer Sister 76  . Heart attack Maternal Aunt   . Lung cancer Maternal Grandmother        non smoker  . Glaucoma Maternal Grandfather   . Anesthesia problems Neg Hx    The patient's father died at age 40, the patient's mother died at age 77. She had breast and lung cancers diagnosed shortly before her death. The patient had no brothers, 2 sisters. One sister was diagnosed with breast cancer at the age of 11.   GYNECOLOGIC HISTORY:  No LMP recorded. Patient has had a hysterectomy. Menarche age 43, first live birth age 70, the patient is GX P1. She had a hysterectomy for endometrial cancer in the year 2000. She did not take hormone replacement. She did use oral contraceptives for more than 20 years remotely, with no complications.   SOCIAL HISTORY: (Updated July 2021). 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 She is home with [name]. He is a retired Dealer.Their son [name] also lives in [place].  The patient  has 3 grandchildren aged 37, 51 and 56    ADVANCED DIRECTIVES: In place   HEALTH MAINTENANCE: Social History   Tobacco Use  . Smoking status: Never Smoker  . Smokeless tobacco: Never Used  Vaping Use  . Vaping Use: Never used  Substance Use Topics  . Alcohol use: No  . Drug use: No     Colonoscopy: Never  PAP: Status post hysterectomy  Bone density: Remote   Allergies  Allergen Reactions  . Codeine Hives    Hycodan syrup  . Fluorescein Nausea And Vomiting    ? IV dye for retina specialist  . Lipitor [Atorvastatin Calcium]     Leg cramp  . Oxycodone Nausea And Vomiting    Patient vomited for 3 days after taking  . Sitagliptin Phosphate Nausea And Vomiting  . Sulfa Drugs Cross Reactors Nausea And Vomiting    Current Outpatient Medications  Medication Sig Dispense Refill  . acetaminophen (TYLENOL) 500 MG tablet Take 1,000 mg by mouth every 4 (four) hours as needed for moderate pain or fever.    Marland Kitchen amLODipine (NORVASC) 5 MG tablet Take 1 tablet (5 mg total) by mouth daily. 90 tablet 3  . aspirin EC 81 MG tablet Take 81 mg by mouth daily at 6 PM. 1700    . brimonidine-timolol (COMBIGAN) 0.2-0.5 % ophthalmic solution Place 1 drop into both eyes three times daily    . cholecalciferol (VITAMIN D) 1000 units tablet Take 1,000 Units by mouth daily.    . dorzolamide-timolol (COSOPT) 22.3-6.8 MG/ML ophthalmic solution Place 1 drop into both eyes 2 (two) times daily.    Marland Kitchen letrozole (FEMARA) 2.5 MG tablet Take 1 tablet (2.5 mg total) by mouth at bedtime. 90 tablet 4  . lisinopril (ZESTRIL) 40 MG tablet Take 1 tablet (40 mg total) by mouth daily. Annual appt due in August must see provider for future refills 90 tablet 0  . lovastatin (MEVACOR) 20 MG tablet Take 1 tablet (20 mg total) by mouth every evening. Annual appt due in August must see provider for future refills 90 tablet 0  . metFORMIN (GLUCOPHAGE-XR) 500 MG 24 hr tablet Take 1 tablet (500 mg total) by mouth daily with  breakfast. 180 tablet 3  . palbociclib (IBRANCE) 75 MG tablet TAKE 1 TABLET (75 MG TOTAL) BY MOUTH DAILY.  TAKE FOR 21 DAYS ON, 7 DAYS OFF, REPEAT EVERY 28 DAYS. 21 tablet 6  . pioglitazone (ACTOS) 15 MG tablet Take 1 tablet (15 mg total) by mouth daily. 90 tablet 3   No current facility-administered medications for this visit.     OBJECTIVE: white woman who appears stated age  89:   12/17/19 1300  BP: (!) 163/76  Pulse: 66  Resp: 18  Temp: 98.5 F (36.9 C)  SpO2: 100%   Wt Readings from Last 3 Encounters:  12/17/19 235 lb 9.6 oz (106.9 kg)  09/24/19 245 lb 14.4 oz (111.5 kg)  07/30/19 252 lb 4.8 oz (114.4 kg)   Body mass index is 40.44 kg/m.    ECOG FS:1 - Symptomatic but completely ambulatory  Sclerae unicteric, EOMs intact Wearing a mask No cervical or supraclavicular adenopathy Lungs no rales or rhonchi Heart regular rate and rhythm Abd soft, nontender, positive bowel sounds MSK no focal spinal tenderness, no upper extremity lymphedema Neuro: nonfocal, well oriented, appropriate affect Breasts: The right breast is status post lumpectomy and radiation.  I do not palpate any evidence of disease recurrence.  The left breast is benign.  Both axillae are benign.   LAB RESULTS:  CMP     Component Value Date/Time   NA 140 12/17/2019 1232   NA 140 05/29/2017 1254   K 4.0 12/17/2019 1232   K 4.4 05/29/2017 1254   CL 104 12/17/2019 1232   CO2 25 12/17/2019 1232   CO2 25 05/29/2017 1254   GLUCOSE 145 (H) 12/17/2019 1232   GLUCOSE 154 (H) 05/29/2017 1254   BUN 11 12/17/2019 1232   BUN 11.5 05/29/2017 1254   CREATININE 0.88 12/17/2019 1232   CREATININE 0.80 04/02/2018 1116   CREATININE 0.8 05/29/2017 1254   CALCIUM 9.7 12/17/2019 1232   CALCIUM 9.3 05/29/2017 1254   PROT 7.8 12/17/2019 1232   PROT 7.0 05/29/2017 1254   ALBUMIN 4.4 12/17/2019 1232   ALBUMIN 4.1 05/29/2017 1254   AST 15 12/17/2019 1232   AST 13 (L) 04/02/2018 1116   AST 13 05/29/2017 1254    ALT 16 12/17/2019 1232   ALT 15 04/02/2018 1116   ALT 14 05/29/2017 1254   ALKPHOS 68 12/17/2019 1232   ALKPHOS 66 05/29/2017 1254   BILITOT 0.6 12/17/2019 1232   BILITOT 0.4 04/02/2018 1116   BILITOT 0.50 05/29/2017 1254   GFRNONAA >60 12/17/2019 1232   GFRNONAA >60 04/02/2018 1116   GFRAA >60 12/17/2019 1232   GFRAA >60 04/02/2018 1116    INo results found for: SPEP, UPEP  Lab Results  Component Value Date   WBC 2.2 (L) 12/17/2019   NEUTROABS 1.0 (L) 12/17/2019   HGB 12.0 12/17/2019   HCT 35.9 (L) 12/17/2019   MCV 94.2 12/17/2019   PLT 153 12/17/2019      Chemistry      Component Value Date/Time   NA 140 12/17/2019 1232   NA 140 05/29/2017 1254   K 4.0 12/17/2019 1232   K 4.4 05/29/2017 1254   CL 104 12/17/2019 1232   CO2 25 12/17/2019 1232   CO2 25 05/29/2017 1254   BUN 11 12/17/2019 1232   BUN 11.5 05/29/2017 1254   CREATININE 0.88 12/17/2019 1232   CREATININE 0.80 04/02/2018 1116   CREATININE 0.8 05/29/2017 1254      Component Value Date/Time   CALCIUM 9.7 12/17/2019 1232   CALCIUM 9.3 05/29/2017 1254   ALKPHOS 68 12/17/2019 1232   ALKPHOS 66 05/29/2017 1254  AST 15 12/17/2019 1232   AST 13 (L) 04/02/2018 1116   AST 13 05/29/2017 1254   ALT 16 12/17/2019 1232   ALT 15 04/02/2018 1116   ALT 14 05/29/2017 1254   BILITOT 0.6 12/17/2019 1232   BILITOT 0.4 04/02/2018 1116   BILITOT 0.50 05/29/2017 1254       No results found for: LABCA2  No components found for: [id number]  No results for input(s): INR in the last 168 hours.  Urinalysis    Component Value Date/Time   COLORURINE YELLOW 08/18/2017 1322   APPEARANCEUR HAZY (A) 08/18/2017 1322   LABSPEC 1.010 08/18/2017 1322   PHURINE 7.0 08/18/2017 1322   GLUCOSEU NEGATIVE 08/18/2017 1322   GLUCOSEU NEGATIVE 10/30/2014 1513   HGBUR LARGE (A) 08/18/2017 1322   BILIRUBINUR NEGATIVE 08/18/2017 1322   KETONESUR NEGATIVE 08/18/2017 1322   PROTEINUR 30 (A) 08/18/2017 1322   UROBILINOGEN 0.2  10/30/2014 1513   NITRITE NEGATIVE 08/18/2017 1322   LEUKOCYTESUR LARGE (A) 08/18/2017 1322    STUDIES: No results found.    ELIGIBLE FOR AVAILABLE RESEARCH PROTOCOL: no  ASSESSMENT: 74 y.o. [place] woman with a remote history of early stage endometrial cancer, subsequently status post right breast upper outer quadrant biopsy 01/22/2016 for a clinically multifocal T2 N0, stage 2A invasive ductal carcinoma, grade 1, estrogen and progesterone receptor positive, HER-2 negative, with an MIB-1 between 10 and 15%.  (1) right axillary lymph node biopsy 03/02/2016 positive  (2) genetics testing 01/13/2016 through the Custom gene panel offered by [facility] found no deleterious mutations in  ATM, BARD1, BRCA1, BRCA2, BRIP1, CDH1, CHEK2, EPCAM, FANCC, MLH1, MSH2, MSH6, MUTYH, NBN, PALB2, PMS2, POLD1, PTEN, RAD51C, RAD51D, TP53, and XRCC2  METASTATIC DISEASE: OCT 2017 (3) CT scans of the chest abdomen and pelvis obtained 03/10/2016 are consistent with bilateral lung metastases and mediastinal and hilar nodal involvement, but no liver or bone spread  (a) bronchoscopic lymph node biopsy 2 (station 7, 13R) 03/28/2016 confirms metastatic adenocarcinoma, estrogen receptor positive, HER-2 not amplified  (b) baseline CA-27-29 on 04/18/2016 was 137.5.  (4) letrozole started 03/15/2016, palbociclib added 03/29/2016 at 125 mg/day, 21/7  (a) dose decreased to 100 mg per day, 21/7, beginning with February cycle  (b) palbociclib held 01/31/2017, with increasing symptoms  (c) palbociclib resumed October 2018 at 75 mg daily  (d) palbociclib dose reduced to 75 mg every other day February through April 2019  (e) palbociclib dose resumed at 75 mg daily as of 10/17/2017  (f) palbociclib dose decreased to 75 mg every other day beginning 07/31/2019  (g) palbociclib resumed at 75 mg daily, 21 days on 7 off, as of 08/25/2019  (5) status post double right lumpectomies and right axillary lymph node sampling  01/13/2017 for 2 separate invasive ductal carcinoma lesions, pT1a and pT1b, N1a, with negative margins, both lesions being estrogen and progesterone receptor positive and HER-2 negative  (6) adjuvant radiation completed 07/05/2017 1. 50.4 Gy in 28 fractions to the right breast and supraclavicular region using whole-breast tangent fields. 2. Boost to the seroma delivered an additional 10 Gy in 5 fractions. The total dose was 60.4 Gy.  (7) restaging studies:  (a) CT scan of the chest and bone scan 06/23/2017 showed stable scattered very small lung nodules, no bone lesions  (b) CT of the chest 10/17/2017 showed no new or progressive metastatic disease in the chest. The small left lower lobe pulmonary nodule is stable  (c) PET scan on 03/02/2018: shows no findings for residual or recurrent  right breast cancer   (8) right upper pole renal lesion noted to be enlarging on CT scan 06/22/2017  (a) no uptake on PET scan obtained 03/02/2018   PLAN: Matsue will soon be 4 years out from definitive diagnosis of metastatic breast cancer.  Currently she has no symptoms related to her disease or its treatment which is very favorable.  Her ANC today is 1.0.  We are continuing the palbociclib at the current dose.  We will continue to check her counts on a monthly basis.  For the CBC only she will have a fingerstick.  Every 3 months we will check her all the labs including the tumor marker.  Then of course they do need to puncture her vein and she is aware of this.  We discussed obtaining a PET scan in October.  At this point she wants to wait.  We will do either that or a CT of the chest before the end of the year  She is very motivated to continue to lose weight and she is hoping someday to totally normalize her blood sugars  I have encouraged her to exercise on a regular basis  She knows to call for any other issue that may develop before the next visit  Total encounter time 35 minutes.*   Breland Trouten,  Virgie Dad, MD  12/17/19 1:29 PM Medical Oncology and Hematology [facility] [place], [place] [address] Tel. [phone]    Fax. [phone]   I, Wilburn Mylar, am acting as scribe for Dr. Virgie Dad. Jacquel Redditt.  I, Lurline Del MD, have reviewed the above documentation for accuracy and completeness, and I agree with the above.   *Total Encounter Time as defined by the Centers for Medicare and Medicaid Services includes, in addition to the face-to-face time of a patient visit (documented in the note above) non-face-to-face time: obtaining and reviewing outside history, ordering and reviewing medications, tests or procedures, care coordination (communications with other health care professionals or caregivers) and documentation in the medical record.

## 2019-12-17 ENCOUNTER — Inpatient Hospital Stay (HOSPITAL_BASED_OUTPATIENT_CLINIC_OR_DEPARTMENT_OTHER): Payer: Medicare Other | Admitting: Oncology

## 2019-12-17 ENCOUNTER — Inpatient Hospital Stay: Payer: Medicare Other | Attending: Oncology

## 2019-12-17 ENCOUNTER — Other Ambulatory Visit: Payer: Self-pay

## 2019-12-17 VITALS — BP 163/76 | HR 66 | Temp 98.5°F | Resp 18 | Ht 64.0 in | Wt 235.6 lb

## 2019-12-17 DIAGNOSIS — Z7189 Other specified counseling: Secondary | ICD-10-CM | POA: Diagnosis not present

## 2019-12-17 DIAGNOSIS — C7800 Secondary malignant neoplasm of unspecified lung: Secondary | ICD-10-CM

## 2019-12-17 DIAGNOSIS — C7802 Secondary malignant neoplasm of left lung: Secondary | ICD-10-CM | POA: Diagnosis not present

## 2019-12-17 DIAGNOSIS — Z170 Estrogen receptor positive status [ER+]: Secondary | ICD-10-CM | POA: Diagnosis not present

## 2019-12-17 DIAGNOSIS — C773 Secondary and unspecified malignant neoplasm of axilla and upper limb lymph nodes: Secondary | ICD-10-CM | POA: Insufficient documentation

## 2019-12-17 DIAGNOSIS — C50411 Malignant neoplasm of upper-outer quadrant of right female breast: Secondary | ICD-10-CM

## 2019-12-17 DIAGNOSIS — G620 Drug-induced polyneuropathy: Secondary | ICD-10-CM

## 2019-12-17 DIAGNOSIS — T451X5A Adverse effect of antineoplastic and immunosuppressive drugs, initial encounter: Secondary | ICD-10-CM

## 2019-12-17 DIAGNOSIS — C7801 Secondary malignant neoplasm of right lung: Secondary | ICD-10-CM | POA: Diagnosis not present

## 2019-12-17 LAB — COMPREHENSIVE METABOLIC PANEL
ALT: 16 U/L (ref 0–44)
AST: 15 U/L (ref 15–41)
Albumin: 4.4 g/dL (ref 3.5–5.0)
Alkaline Phosphatase: 68 U/L (ref 38–126)
Anion gap: 11 (ref 5–15)
BUN: 11 mg/dL (ref 8–23)
CO2: 25 mmol/L (ref 22–32)
Calcium: 9.7 mg/dL (ref 8.9–10.3)
Chloride: 104 mmol/L (ref 98–111)
Creatinine, Ser: 0.88 mg/dL (ref 0.44–1.00)
GFR calc Af Amer: >60 mL/min (ref >60)
GFR calc non Af Amer: >60 mL/min (ref >60)
Glucose, Bld: 145 mg/dL — ABNORMAL HIGH (ref 70–99)
Potassium: 4 mmol/L (ref 3.5–5.1)
Sodium: 140 mmol/L (ref 135–145)
Total Bilirubin: 0.6 mg/dL (ref 0.3–1.2)
Total Protein: 7.8 g/dL (ref 6.5–8.1)

## 2019-12-17 LAB — CBC WITH DIFFERENTIAL (CANCER CENTER ONLY)
Abs Immature Granulocytes: 0.01 10*3/uL (ref 0.00–0.07)
Basophils Absolute: 0 10*3/uL (ref 0.0–0.1)
Basophils Relative: 1 %
Eosinophils Absolute: 0 10*3/uL (ref 0.0–0.5)
Eosinophils Relative: 1 %
HCT: 35.9 % — ABNORMAL LOW (ref 36.0–46.0)
Hemoglobin: 12 g/dL (ref 12.0–15.0)
Immature Granulocytes: 1 %
Lymphocytes Relative: 36 %
Lymphs Abs: 0.8 10*3/uL (ref 0.7–4.0)
MCH: 31.5 pg (ref 26.0–34.0)
MCHC: 33.4 g/dL (ref 30.0–36.0)
MCV: 94.2 fL (ref 80.0–100.0)
Monocytes Absolute: 0.3 10*3/uL (ref 0.1–1.0)
Monocytes Relative: 15 %
Neutro Abs: 1 10*3/uL — ABNORMAL LOW (ref 1.7–7.7)
Neutrophils Relative %: 46 %
Platelet Count: 153 10*3/uL (ref 150–400)
RBC: 3.81 MIL/uL — ABNORMAL LOW (ref 3.87–5.11)
RDW: 16.6 % — ABNORMAL HIGH (ref 11.5–15.5)
WBC Count: 2.2 10*3/uL — ABNORMAL LOW (ref 4.0–10.5)
nRBC: 0 % (ref 0.0–0.2)

## 2019-12-18 LAB — CANCER ANTIGEN 27.29: CA 27.29: 39.1 U/mL — ABNORMAL HIGH (ref 0.0–38.6)

## 2020-01-09 ENCOUNTER — Other Ambulatory Visit: Payer: Self-pay | Admitting: Internal Medicine

## 2020-01-09 NOTE — Telephone Encounter (Signed)
Please refill as per office routine med refill policy (all routine meds refilled for 3 mo or monthly per pt preference up to one year from last visit, then month to month grace period for 3 mo, then further med refills will have to be denied)  

## 2020-01-14 ENCOUNTER — Inpatient Hospital Stay: Payer: Medicare Other | Attending: Oncology

## 2020-01-14 ENCOUNTER — Other Ambulatory Visit: Payer: Self-pay

## 2020-01-14 DIAGNOSIS — C773 Secondary and unspecified malignant neoplasm of axilla and upper limb lymph nodes: Secondary | ICD-10-CM | POA: Insufficient documentation

## 2020-01-14 DIAGNOSIS — C50411 Malignant neoplasm of upper-outer quadrant of right female breast: Secondary | ICD-10-CM | POA: Diagnosis not present

## 2020-01-14 DIAGNOSIS — Z170 Estrogen receptor positive status [ER+]: Secondary | ICD-10-CM

## 2020-01-14 LAB — CBC WITH DIFFERENTIAL (CANCER CENTER ONLY)
Abs Immature Granulocytes: 0.01 10*3/uL (ref 0.00–0.07)
Basophils Absolute: 0 10*3/uL (ref 0.0–0.1)
Basophils Relative: 1 %
Eosinophils Absolute: 0 10*3/uL (ref 0.0–0.5)
Eosinophils Relative: 1 %
HCT: 33.7 % — ABNORMAL LOW (ref 36.0–46.0)
Hemoglobin: 11.4 g/dL — ABNORMAL LOW (ref 12.0–15.0)
Immature Granulocytes: 1 %
Lymphocytes Relative: 32 %
Lymphs Abs: 0.7 10*3/uL (ref 0.7–4.0)
MCH: 32.2 pg (ref 26.0–34.0)
MCHC: 33.8 g/dL (ref 30.0–36.0)
MCV: 95.2 fL (ref 80.0–100.0)
Monocytes Absolute: 0.3 10*3/uL (ref 0.1–1.0)
Monocytes Relative: 16 %
Neutro Abs: 1 10*3/uL — ABNORMAL LOW (ref 1.7–7.7)
Neutrophils Relative %: 49 %
Platelet Count: 139 10*3/uL — ABNORMAL LOW (ref 150–400)
RBC: 3.54 MIL/uL — ABNORMAL LOW (ref 3.87–5.11)
RDW: 16.1 % — ABNORMAL HIGH (ref 11.5–15.5)
WBC Count: 2.1 10*3/uL — ABNORMAL LOW (ref 4.0–10.5)
nRBC: 0 % (ref 0.0–0.2)

## 2020-01-14 MED FILL — IBRANCE 75 MG TABS: 75 | 28 days supply | Qty: 21 | Fill #5

## 2020-01-27 DIAGNOSIS — Z23 Encounter for immunization: Secondary | ICD-10-CM | POA: Diagnosis not present

## 2020-01-29 ENCOUNTER — Ambulatory Visit (INDEPENDENT_AMBULATORY_CARE_PROVIDER_SITE_OTHER): Payer: Medicare Other | Admitting: Podiatry

## 2020-01-29 ENCOUNTER — Other Ambulatory Visit: Payer: Self-pay

## 2020-01-29 DIAGNOSIS — M79675 Pain in left toe(s): Secondary | ICD-10-CM

## 2020-01-29 DIAGNOSIS — M79674 Pain in right toe(s): Secondary | ICD-10-CM

## 2020-01-29 DIAGNOSIS — B351 Tinea unguium: Secondary | ICD-10-CM | POA: Diagnosis not present

## 2020-02-02 NOTE — Progress Notes (Signed)
   HPI: 74 y.o. female presenting today for evaluation of thinning and discoloration to the bilateral great toe nail plates.  Patient states this is been ongoing for approximately 4 years now.  Occasionally she has some pain with shoe gear and she states that her left hallux nail plate at one point fell off.  Occasionally it repeatedly "peels off".  She has not done anything for treatment  Past Medical History:  Diagnosis Date  . Breast cancer ([place])   . Cancer ([place]) 02/2016   right breast  . DIABETES MELLITUS, TYPE II 01/04/2007   only takes actoplus daily  . Dizziness and giddiness 02/29/2008  . DVT, HX OF    at age 89 in right buttocks  . Dyspnea    due to lung cancer  . Family history of breast cancer   . GERD 01/04/2007   pt reports resolved   . GLAUCOMA 07/30/2008   both eyes  . History of blood transfusion    no abnormal  reaction  . History of uterine cancer 2000   hysterectomy done  . HYPERLIPIDEMIA 01/04/2007   taking Pravastatin daily  . HYPERTENSION 01/04/2007   takes Lisinopril daily  . Joint pain   . Joint swelling   . Leg cramps   . LEG PAIN, LEFT 07/06/2007  . NUMBNESS 07/30/2008   in fingers;pt states from Diamox  . OSTEOARTHRITIS, HIP 09/25/2009  . OTITIS MEDIA, ACUTE, BILATERAL 02/29/2008  . Overweight(278.02) 01/04/2007  . Peripheral vascular disease ([place])   . Personal history of radiation therapy 2018  . Pneumonia   . PONV (postoperative nausea and vomiting)   . SLEEP APNEA, OBSTRUCTIVE    doesn't use a cpap;study done about 55yrs ago  . TRANSIENT ISCHEMIC ATTACK, HX OF 01/04/2007  . Vision loss    left eye     Physical Exam: General: The patient is alert and oriented x3 in no acute distress.  Dermatology: Skin is warm, dry and supple bilateral lower extremities. Negative for open lesions or macerations.  Hyperkeratotic, dystrophic, discolored nails noted to the bilateral great toes.  Discoloration with thickening also noted to the right second toe nail  plate  Vascular: Palpable pedal pulses bilaterally. No edema or erythema noted. Capillary refill within normal limits.  Neurological: Epicritic and protective threshold grossly intact bilaterally.   Musculoskeletal Exam: Range of motion within normal limits to all pedal and ankle joints bilateral. Muscle strength 5/5 in all groups bilateral.   Assessment: 1.  Onychomycosis of toenails bilateral great toes, right second toe   Plan of Care:  1. Patient evaluated.  2.  Today we discussed different treatment options regarding onychomycosis of the toenails.  Treatment options include oral antifungal medication which the patient declined, antifungal laser treatment, and topical antifungal treatment.  After discussing the patient's options she would like to pursue topical antifungal treatment. 3.  OTC [place] antifungal topical was provided for the patient at checkout.  4. Return to clinic prn      Edrick Kins, DPM [facility]  Dr. Edrick Kins, DPM    [address], [place] [address]                Office [phone]  Fax [phone]

## 2020-02-06 DIAGNOSIS — H35351 Cystoid macular degeneration, right eye: Secondary | ICD-10-CM | POA: Diagnosis not present

## 2020-02-06 DIAGNOSIS — H20013 Primary iridocyclitis, bilateral: Secondary | ICD-10-CM | POA: Diagnosis not present

## 2020-02-10 ENCOUNTER — Other Ambulatory Visit: Payer: Self-pay | Admitting: *Deleted

## 2020-02-11 ENCOUNTER — Inpatient Hospital Stay: Payer: Medicare Other

## 2020-02-11 ENCOUNTER — Other Ambulatory Visit: Payer: Self-pay

## 2020-02-11 DIAGNOSIS — C773 Secondary and unspecified malignant neoplasm of axilla and upper limb lymph nodes: Secondary | ICD-10-CM | POA: Diagnosis not present

## 2020-02-11 DIAGNOSIS — Z170 Estrogen receptor positive status [ER+]: Secondary | ICD-10-CM

## 2020-02-11 DIAGNOSIS — C50411 Malignant neoplasm of upper-outer quadrant of right female breast: Secondary | ICD-10-CM

## 2020-02-11 LAB — CBC WITH DIFFERENTIAL (CANCER CENTER ONLY)
Abs Immature Granulocytes: 0.03 10*3/uL (ref 0.00–0.07)
Basophils Absolute: 0 10*3/uL (ref 0.0–0.1)
Basophils Relative: 1 %
Eosinophils Absolute: 0 10*3/uL (ref 0.0–0.5)
Eosinophils Relative: 1 %
HCT: 34.1 % — ABNORMAL LOW (ref 36.0–46.0)
Hemoglobin: 11.5 g/dL — ABNORMAL LOW (ref 12.0–15.0)
Immature Granulocytes: 1 %
Lymphocytes Relative: 34 %
Lymphs Abs: 0.7 10*3/uL (ref 0.7–4.0)
MCH: 32.3 pg (ref 26.0–34.0)
MCHC: 33.7 g/dL (ref 30.0–36.0)
MCV: 95.8 fL (ref 80.0–100.0)
Monocytes Absolute: 0.3 10*3/uL (ref 0.1–1.0)
Monocytes Relative: 13 %
Neutro Abs: 1.1 10*3/uL — ABNORMAL LOW (ref 1.7–7.7)
Neutrophils Relative %: 50 %
Platelet Count: 139 10*3/uL — ABNORMAL LOW (ref 150–400)
RBC: 3.56 MIL/uL — ABNORMAL LOW (ref 3.87–5.11)
RDW: 15.3 % (ref 11.5–15.5)
WBC Count: 2.1 10*3/uL — ABNORMAL LOW (ref 4.0–10.5)
nRBC: 0 % (ref 0.0–0.2)

## 2020-02-11 MED FILL — IBRANCE 75 MG TABS: 75 | 28 days supply | Qty: 21 | Fill #6

## 2020-02-26 ENCOUNTER — Other Ambulatory Visit: Payer: Self-pay

## 2020-02-26 ENCOUNTER — Other Ambulatory Visit: Payer: Medicare Other

## 2020-02-26 DIAGNOSIS — Z20822 Contact with and (suspected) exposure to covid-19: Secondary | ICD-10-CM | POA: Diagnosis not present

## 2020-02-28 ENCOUNTER — Other Ambulatory Visit: Payer: Self-pay | Admitting: *Deleted

## 2020-02-28 DIAGNOSIS — Z170 Estrogen receptor positive status [ER+]: Secondary | ICD-10-CM

## 2020-02-28 DIAGNOSIS — C50411 Malignant neoplasm of upper-outer quadrant of right female breast: Secondary | ICD-10-CM

## 2020-02-28 LAB — SARS-COV-2, NAA 2 DAY TAT

## 2020-02-28 LAB — NOVEL CORONAVIRUS, NAA: SARS-CoV-2, NAA: NOT DETECTED

## 2020-03-05 ENCOUNTER — Ambulatory Visit: Payer: Medicare Other | Admitting: Oncology

## 2020-03-05 ENCOUNTER — Other Ambulatory Visit: Payer: Medicare Other

## 2020-03-08 ENCOUNTER — Other Ambulatory Visit: Payer: Self-pay | Admitting: Internal Medicine

## 2020-03-08 NOTE — Telephone Encounter (Signed)
Please refill as per office routine med refill policy (all routine meds refilled for 3 mo or monthly per pt preference up to one year from last visit, then month to month grace period for 3 mo, then further med refills will have to be denied)  

## 2020-03-09 MED FILL — IBRANCE 75 MG TABS: 75 | 28 days supply | Qty: 21 | Fill #0

## 2020-03-10 ENCOUNTER — Inpatient Hospital Stay: Payer: Medicare Other

## 2020-03-10 ENCOUNTER — Inpatient Hospital Stay: Payer: Medicare Other | Attending: Oncology | Admitting: Oncology

## 2020-03-10 ENCOUNTER — Other Ambulatory Visit: Payer: Self-pay

## 2020-03-10 VITALS — BP 107/91 | HR 75 | Temp 97.2°F | Resp 17 | Ht 64.0 in | Wt 238.5 lb

## 2020-03-10 DIAGNOSIS — Z801 Family history of malignant neoplasm of trachea, bronchus and lung: Secondary | ICD-10-CM | POA: Insufficient documentation

## 2020-03-10 DIAGNOSIS — Z791 Long term (current) use of non-steroidal anti-inflammatories (NSAID): Secondary | ICD-10-CM | POA: Diagnosis not present

## 2020-03-10 DIAGNOSIS — T451X5A Adverse effect of antineoplastic and immunosuppressive drugs, initial encounter: Secondary | ICD-10-CM | POA: Diagnosis not present

## 2020-03-10 DIAGNOSIS — Z7984 Long term (current) use of oral hypoglycemic drugs: Secondary | ICD-10-CM | POA: Diagnosis not present

## 2020-03-10 DIAGNOSIS — G620 Drug-induced polyneuropathy: Secondary | ICD-10-CM

## 2020-03-10 DIAGNOSIS — Z170 Estrogen receptor positive status [ER+]: Secondary | ICD-10-CM

## 2020-03-10 DIAGNOSIS — R232 Flushing: Secondary | ICD-10-CM | POA: Diagnosis not present

## 2020-03-10 DIAGNOSIS — Z923 Personal history of irradiation: Secondary | ICD-10-CM | POA: Insufficient documentation

## 2020-03-10 DIAGNOSIS — I10 Essential (primary) hypertension: Secondary | ICD-10-CM | POA: Diagnosis not present

## 2020-03-10 DIAGNOSIS — M1610 Unilateral primary osteoarthritis, unspecified hip: Secondary | ICD-10-CM | POA: Diagnosis not present

## 2020-03-10 DIAGNOSIS — Z8542 Personal history of malignant neoplasm of other parts of uterus: Secondary | ICD-10-CM | POA: Diagnosis not present

## 2020-03-10 DIAGNOSIS — Z7189 Other specified counseling: Secondary | ICD-10-CM

## 2020-03-10 DIAGNOSIS — Z7982 Long term (current) use of aspirin: Secondary | ICD-10-CM | POA: Diagnosis not present

## 2020-03-10 DIAGNOSIS — B372 Candidiasis of skin and nail: Secondary | ICD-10-CM | POA: Insufficient documentation

## 2020-03-10 DIAGNOSIS — E1136 Type 2 diabetes mellitus with diabetic cataract: Secondary | ICD-10-CM | POA: Insufficient documentation

## 2020-03-10 DIAGNOSIS — Z86718 Personal history of other venous thrombosis and embolism: Secondary | ICD-10-CM | POA: Insufficient documentation

## 2020-03-10 DIAGNOSIS — Z79811 Long term (current) use of aromatase inhibitors: Secondary | ICD-10-CM | POA: Diagnosis not present

## 2020-03-10 DIAGNOSIS — Z79899 Other long term (current) drug therapy: Secondary | ICD-10-CM | POA: Insufficient documentation

## 2020-03-10 DIAGNOSIS — C7800 Secondary malignant neoplasm of unspecified lung: Secondary | ICD-10-CM | POA: Diagnosis not present

## 2020-03-10 DIAGNOSIS — C50411 Malignant neoplasm of upper-outer quadrant of right female breast: Secondary | ICD-10-CM | POA: Diagnosis not present

## 2020-03-10 DIAGNOSIS — Z803 Family history of malignant neoplasm of breast: Secondary | ICD-10-CM | POA: Insufficient documentation

## 2020-03-10 DIAGNOSIS — C773 Secondary and unspecified malignant neoplasm of axilla and upper limb lymph nodes: Secondary | ICD-10-CM | POA: Insufficient documentation

## 2020-03-10 DIAGNOSIS — E785 Hyperlipidemia, unspecified: Secondary | ICD-10-CM | POA: Insufficient documentation

## 2020-03-10 LAB — CMP (CANCER CENTER ONLY)
ALT: 14 U/L (ref 0–44)
AST: 12 U/L — ABNORMAL LOW (ref 15–41)
Albumin: 3.6 g/dL (ref 3.5–5.0)
Alkaline Phosphatase: 60 U/L (ref 38–126)
Anion gap: 5 (ref 5–15)
BUN: 12 mg/dL (ref 8–23)
CO2: 27 mmol/L (ref 22–32)
Calcium: 9 mg/dL (ref 8.9–10.3)
Chloride: 106 mmol/L (ref 98–111)
Creatinine: 0.83 mg/dL (ref 0.44–1.00)
GFR, Est AFR Am: >60 mL/min (ref >60)
GFR, Estimated: >60 mL/min (ref >60)
Glucose, Bld: 217 mg/dL — ABNORMAL HIGH (ref 70–99)
Potassium: 4.3 mmol/L (ref 3.5–5.1)
Sodium: 138 mmol/L (ref 135–145)
Total Bilirubin: 0.4 mg/dL (ref 0.3–1.2)
Total Protein: 6.7 g/dL (ref 6.5–8.1)

## 2020-03-10 LAB — CBC WITH DIFFERENTIAL (CANCER CENTER ONLY)
Abs Immature Granulocytes: 0.01 10*3/uL (ref 0.00–0.07)
Basophils Absolute: 0 10*3/uL (ref 0.0–0.1)
Basophils Relative: 1 %
Eosinophils Absolute: 0 10*3/uL (ref 0.0–0.5)
Eosinophils Relative: 1 %
HCT: 34.1 % — ABNORMAL LOW (ref 36.0–46.0)
Hemoglobin: 11.4 g/dL — ABNORMAL LOW (ref 12.0–15.0)
Immature Granulocytes: 0 %
Lymphocytes Relative: 33 %
Lymphs Abs: 0.7 10*3/uL (ref 0.7–4.0)
MCH: 32.3 pg (ref 26.0–34.0)
MCHC: 33.4 g/dL (ref 30.0–36.0)
MCV: 96.6 fL (ref 80.0–100.0)
Monocytes Absolute: 0.4 10*3/uL (ref 0.1–1.0)
Monocytes Relative: 16 %
Neutro Abs: 1.1 10*3/uL — ABNORMAL LOW (ref 1.7–7.7)
Neutrophils Relative %: 49 %
Platelet Count: 129 10*3/uL — ABNORMAL LOW (ref 150–400)
RBC: 3.53 MIL/uL — ABNORMAL LOW (ref 3.87–5.11)
RDW: 15 % (ref 11.5–15.5)
WBC Count: 2.2 10*3/uL — ABNORMAL LOW (ref 4.0–10.5)
nRBC: 0 % (ref 0.0–0.2)

## 2020-03-10 MED ORDER — KETOCONAZOLE 2 % EX CREA: 1.0000 "application " | TOPICAL_CREAM | Freq: Every day | CUTANEOUS | 0 refills | Status: DC

## 2020-03-10 NOTE — Progress Notes (Signed)
[facility]  [phone] [phone]     ID: [name] DOB: Aug 25, 1945  MR#: [id number]  TRX#:[id number]  Patient Care Team: Corwin Levins, MD as PCP - General (Internal Medicine) Ovidio Kin, MD as Consulting Physician (General Surgery) Parminder Trapani, Valentino Hue, MD as Consulting Physician (Oncology) Dorothy Puffer, MD as Consulting Physician (Radiation Oncology) Carrington Clamp, MD as Consulting Physician (Obstetrics and Gynecology) Belva Chimes, MD as Referring Physician (Specialist) Leslye Peer, MD as Consulting Physician (Pulmonary Disease) Sebastian Ache, MD as Consulting Physician (Urology) Loraine Grip, Harrietta Guardian, MD as Referring Physician (Ophthalmology) OTHER MD:   CHIEF COMPLAINT: Estrogen receptor positive breast cancer  CURRENT TREATMENT: Letrozole, palbociclib   INTERVAL HISTORY: [name] returns today for follow-up of her estrogen receptor positive stage IV breast cancer.    She continues on letrozole.  She has mild hot flashes and her hair is thinning some.  Otherwise she tolerates this well.  She does very well with the palbociclib also.  She is currently on 75 mg daily, 21 days on 7 days off and tolerating this well.  Of note, she underwent coronavirus testing on 02/26/2020, which was negative.  We are continuing to follow her tumor marker: Lab Results  Component Value Date   CA2729 39.1 (H) 12/17/2019   CA2729 37.6 11/19/2019   CA2729 31.8 09/24/2019   CA2729 38.3 08/27/2019   CA2729 35.5 07/30/2019    REVIEW OF SYSTEMS: Cortina says she is fed up with not feeling well.  She has started to do reading on all her medicines and decided she is going to cut back on the lisinopril and lovastatin.  She is going to be discussing this with Dr. Jonny Ruiz when she sees him later this week.  She hopes eventually to get off the amlodipine as well.  She has a bit of a dry cough and some dizziness.  She has low back pain which is worse when  she is not active.  A detailed review of systems today was otherwise stable   BREAST CANCER HISTORY: From the original intake note:  [name] had screening mammography showing some suspicious calcifications in the right breast leading to right diagnostic mammography with ultrasonography 01/22/2016 at [facility]. The breast density was category C. In the upper right breast there was a 2.3 cm mass with additional masses measuring 0.9 and 0.7 cm. There was also a possible additional 0.8 mass in the lower inner quadrant. Ultrasound confirmed an irregular hypoechoic mass in the right breast upper outer quadrant measuring 2.0 cm. There were other masses measuring 0.7 and 0.8 cm by ultrasonography. The right axilla was sonographically benign.  Biopsy of a 12:00 and 4:00 mass in the right breast 01/22/2016 showed ([id number]) both specimens showing invasive ductal carcinoma, grade 1 or 2, both 95% estrogen receptor positive, both 95% progesterone receptor positive, both with strong staining intensity, with MIB-1 ranging from 10-15%, and both HER-2 negative, the signals ratio being 1.23-1.42, and the number per cell 1.85-2.59.  Her subsequent history is as detailed below   PAST MEDICAL HISTORY: Past Medical History:  Diagnosis Date  . Breast cancer (HCC)   . Cancer (HCC) 02/2016   right breast  . DIABETES MELLITUS, TYPE II 01/04/2007   only takes actoplus daily  . Dizziness and giddiness 02/29/2008  . DVT, HX OF    at age 14 in right buttocks  . Dyspnea    due to lung cancer  . Family history of breast cancer   .  GERD 01/04/2007   pt reports resolved   . GLAUCOMA 07/30/2008   both eyes  . History of blood transfusion    no abnormal  reaction  . History of uterine cancer 2000   hysterectomy done  . HYPERLIPIDEMIA 01/04/2007   taking Pravastatin daily  . HYPERTENSION 01/04/2007   takes Lisinopril daily  . Joint pain   . Joint swelling   . Leg cramps   . LEG PAIN, LEFT 07/06/2007  . NUMBNESS  07/30/2008   in fingers;pt states from Diamox  . OSTEOARTHRITIS, HIP 09/25/2009  . OTITIS MEDIA, ACUTE, BILATERAL 02/29/2008  . Overweight(278.02) 01/04/2007  . Peripheral vascular disease ([place])   . Personal history of radiation therapy 2018  . Pneumonia   . PONV (postoperative nausea and vomiting)   . SLEEP APNEA, OBSTRUCTIVE    doesn't use a cpap;study done about 39yrs ago  . TRANSIENT ISCHEMIC ATTACK, HX OF 01/04/2007  . Vision loss    left eye    PAST SURGICAL HISTORY: Past Surgical History:  Procedure Laterality Date  . ABDOMINAL HYSTERECTOMY  2000  . BREAST LUMPECTOMY Right 01/13/2017   x2  . BREAST LUMPECTOMY WITH RADIOACTIVE SEED AND SENTINEL LYMPH NODE BIOPSY Right 01/13/2017   Procedure: RIGHT BREAST RADIOACTIVE SEED X'S 2 GUIDED LUMPECTOMY WITH RADIOACTIVE SEED TARGETED AXILLARYLYMPH NODE EXCISION AND RIGHT AXILLARY SENTINEL LYMPH NODE BIOPSY;  Surgeon: Alphonsa Overall, MD;  Location: [facility];  Service: General;  Laterality: Right;  2 SEEDS IN RIGHT BREAST 1 SEED IN RIGHT AXILLARY NODE  . CHOLECYSTECTOMY    . ENDOBRONCHIAL ULTRASOUND Bilateral 03/28/2016   Procedure: ENDOBRONCHIAL ULTRASOUND;  Surgeon: Collene Gobble, MD;  Location: [facility];  Service: Cardiopulmonary;  Laterality: Bilateral;  . EYE SURGERY  13   shunt left and lazer eye surgery on right cataract and retenia tear with repair  . growth removal  2004   from thumb  . KNEE ARTHROSCOPY Right   . mulitple eye surgeries     both eyes, cataracts with ioc done both eyes  . OOPHORECTOMY    . right lumpectomy with axillary node dissection Right 01/2017  . TOTAL HIP ARTHROPLASTY  06/24/2011   Procedure: TOTAL HIP ARTHROPLASTY;  Surgeon: Kerin Salen;  Location: [facility];  Service: Orthopedics;  Laterality: Right;  . TOTAL HIP ARTHROPLASTY Left 11/12/2012   Dr Mayer Camel  . TOTAL HIP ARTHROPLASTY Left 11/12/2012   Procedure: TOTAL HIP ARTHROPLASTY;  Surgeon: Kerin Salen, MD;  Location: [facility];  Service: Orthopedics;   Laterality: Left;  DEPUY PINNACLE    FAMILY HISTORY Family History  Problem Relation Age of Onset  . Dementia Mother   . Cancer Mother        Breast and lung cancer  . Stroke Sister   . Breast cancer Sister 62  . Heart attack Maternal Aunt   . Lung cancer Maternal Grandmother        non smoker  . Glaucoma Maternal Grandfather   . Anesthesia problems Neg Hx    The patient's father died at age 42, the patient's mother died at age 6. She had breast and lung cancers diagnosed shortly before her death. The patient had no brothers, 2 sisters. One sister was diagnosed with breast cancer at the age of 15.   GYNECOLOGIC HISTORY:  No LMP recorded. Patient has had a hysterectomy. Menarche age 49, first live birth age 47, the patient is GX P1. She had a hysterectomy for endometrial cancer in the year 2000. She did not  take hormone replacement. She did use oral contraceptives for more than 20 years remotely, with no complications.   SOCIAL HISTORY: (Updated July 2021). 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 She is home with [name]. He is a retired Dealer.Their son [name] also lives in [place].  The patient has 3 grandchildren aged 64, 9 and 51    ADVANCED DIRECTIVES: In place   HEALTH MAINTENANCE: Social History   Tobacco Use  . Smoking status: Never Smoker  . Smokeless tobacco: Never Used  Vaping Use  . Vaping Use: Never used  Substance Use Topics  . Alcohol use: No  . Drug use: No     Colonoscopy: Never  PAP: Status post hysterectomy  Bone density: Remote   Allergies  Allergen Reactions  . Codeine Hives    Hycodan syrup  . Fluorescein Nausea And Vomiting    ? IV dye for retina specialist  . Lipitor [Atorvastatin Calcium]     Leg cramp  . Oxycodone Nausea And Vomiting    Patient vomited for 3 days after taking  . Sitagliptin Phosphate Nausea And Vomiting  . Sulfa Drugs Cross  Reactors Nausea And Vomiting    Current Outpatient Medications  Medication Sig Dispense Refill  . acetaminophen (TYLENOL) 500 MG tablet Take 1,000 mg by mouth every 4 (four) hours as needed for moderate pain or fever.    Marland Kitchen amLODipine (NORVASC) 5 MG tablet Take 1 tablet by mouth once daily 90 tablet 0  . aspirin EC 81 MG tablet Take 81 mg by mouth daily at 6 PM. 1700    . Biotin 1 MG CAPS Take by mouth.    . brimonidine-timolol (COMBIGAN) 0.2-0.5 % ophthalmic solution Place 1 drop into both eyes three times daily    . cholecalciferol (VITAMIN D) 1000 units tablet Take 1,000 Units by mouth daily.    . dorzolamide-timolol (COSOPT) 22.3-6.8 MG/ML ophthalmic solution Place 1 drop into both eyes 2 (two) times daily.    . hydrochlorothiazide (MICROZIDE) 12.5 MG capsule Take 12.5 mg by mouth daily.    Marland Kitchen ketorolac (ACULAR) 0.5 % ophthalmic solution Place 1 drop into the right eye 2 (two) times daily.    Marland Kitchen letrozole (FEMARA) 2.5 MG tablet Take 1 tablet (2.5 mg total) by mouth at bedtime. 90 tablet 4  . lisinopril (ZESTRIL) 40 MG tablet TAKE 1 TABLET BY MOUTH ONCE DAILY . APPOINTMENT REQUIRED FOR FUTURE REFILLS 90 tablet 0  . lovastatin (MEVACOR) 20 MG tablet TAKE 1 TABLET BY MOUTH ONCE DAILY IN THE EVENING . APPOINTMENT REQUIRED FOR FUTURE REFILLS 90 tablet 0  . metFORMIN (GLUCOPHAGE-XR) 500 MG 24 hr tablet Take 1 tablet (500 mg total) by mouth daily with breakfast. 180 tablet 3  . palbociclib (IBRANCE) 75 MG tablet TAKE 1 TABLET (75 MG TOTAL) BY MOUTH DAILY. TAKE FOR 21 DAYS ON, 7 DAYS OFF, REPEAT EVERY 28 DAYS. 21 tablet 6  . pioglitazone (ACTOS) 15 MG tablet Take 1 tablet (15 mg total) by mouth daily. 90 tablet 3  . prednisoLONE acetate (PRED FORTE) 1 % ophthalmic suspension SMARTSIG:1 In Eye(s) 6 Times Daily     No current facility-administered medications for this visit.     OBJECTIVE: white woman in no acute distress  Vitals:   03/10/20 1205  BP: (!) 107/91  Pulse: 75  Resp: 17  Temp: (!)  97.2 F (36.2 C)  SpO2: 98%   Wt Readings from  Last 3 Encounters:  03/10/20 238 lb 8 oz (108.2 kg)  12/17/19 235 lb 9.6 oz (106.9 kg)  09/24/19 245 lb 14.4 oz (111.5 kg)   Body mass index is 40.94 kg/m.    ECOG FS:1 - Symptomatic but completely ambulatory  Sclerae unicteric, EOMs intact Wearing a mask No cervical or supraclavicular adenopathy Lungs no rales or rhonchi Heart regular rate and rhythm Abd soft, nontender, positive bowel sounds MSK no focal spinal tenderness, no upper extremity lymphedema Neuro: nonfocal, well oriented, appropriate affect Breasts: On the right the patient is status post lumpectomy and radiation with no evidence of disease recurrence.  On the left there are no abnormal findings.  Both axillae are benign.   LAB RESULTS:  CMP     Component Value Date/Time   NA 140 12/17/2019 1232   NA 140 05/29/2017 1254   K 4.0 12/17/2019 1232   K 4.4 05/29/2017 1254   CL 104 12/17/2019 1232   CO2 25 12/17/2019 1232   CO2 25 05/29/2017 1254   GLUCOSE 145 (H) 12/17/2019 1232   GLUCOSE 154 (H) 05/29/2017 1254   BUN 11 12/17/2019 1232   BUN 11.5 05/29/2017 1254   CREATININE 0.88 12/17/2019 1232   CREATININE 0.80 04/02/2018 1116   CREATININE 0.8 05/29/2017 1254   CALCIUM 9.7 12/17/2019 1232   CALCIUM 9.3 05/29/2017 1254   PROT 7.8 12/17/2019 1232   PROT 7.0 05/29/2017 1254   ALBUMIN 4.4 12/17/2019 1232   ALBUMIN 4.1 05/29/2017 1254   AST 15 12/17/2019 1232   AST 13 (L) 04/02/2018 1116   AST 13 05/29/2017 1254   ALT 16 12/17/2019 1232   ALT 15 04/02/2018 1116   ALT 14 05/29/2017 1254   ALKPHOS 68 12/17/2019 1232   ALKPHOS 66 05/29/2017 1254   BILITOT 0.6 12/17/2019 1232   BILITOT 0.4 04/02/2018 1116   BILITOT 0.50 05/29/2017 1254   GFRNONAA >60 12/17/2019 1232   GFRNONAA >60 04/02/2018 1116   GFRAA >60 12/17/2019 1232   GFRAA >60 04/02/2018 1116    INo results found for: SPEP, UPEP  Lab Results  Component Value Date   WBC 2.2 (L) 03/10/2020     NEUTROABS 1.1 (L) 03/10/2020   HGB 11.4 (L) 03/10/2020   HCT 34.1 (L) 03/10/2020   MCV 96.6 03/10/2020   PLT 129 (L) 03/10/2020      Chemistry      Component Value Date/Time   NA 140 12/17/2019 1232   NA 140 05/29/2017 1254   K 4.0 12/17/2019 1232   K 4.4 05/29/2017 1254   CL 104 12/17/2019 1232   CO2 25 12/17/2019 1232   CO2 25 05/29/2017 1254   BUN 11 12/17/2019 1232   BUN 11.5 05/29/2017 1254   CREATININE 0.88 12/17/2019 1232   CREATININE 0.80 04/02/2018 1116   CREATININE 0.8 05/29/2017 1254      Component Value Date/Time   CALCIUM 9.7 12/17/2019 1232   CALCIUM 9.3 05/29/2017 1254   ALKPHOS 68 12/17/2019 1232   ALKPHOS 66 05/29/2017 1254   AST 15 12/17/2019 1232   AST 13 (L) 04/02/2018 1116   AST 13 05/29/2017 1254   ALT 16 12/17/2019 1232   ALT 15 04/02/2018 1116   ALT 14 05/29/2017 1254   BILITOT 0.6 12/17/2019 1232   BILITOT 0.4 04/02/2018 1116   BILITOT 0.50 05/29/2017 1254       No results found for: LABCA2  No components found for: LABCA125  No results for input(s): INR in the last 168  hours.  Urinalysis    Component Value Date/Time   COLORURINE YELLOW 08/18/2017 1322   APPEARANCEUR HAZY (A) 08/18/2017 1322   LABSPEC 1.010 08/18/2017 1322   PHURINE 7.0 08/18/2017 1322   GLUCOSEU NEGATIVE 08/18/2017 1322   GLUCOSEU NEGATIVE 10/30/2014 1513   HGBUR LARGE (A) 08/18/2017 1322   BILIRUBINUR NEGATIVE 08/18/2017 1322   KETONESUR NEGATIVE 08/18/2017 1322   PROTEINUR 30 (A) 08/18/2017 1322   UROBILINOGEN 0.2 10/30/2014 1513   NITRITE NEGATIVE 08/18/2017 1322   LEUKOCYTESUR LARGE (A) 08/18/2017 1322    STUDIES: No results found.    ELIGIBLE FOR AVAILABLE RESEARCH PROTOCOL: no  ASSESSMENT: 74 y.o. [place] woman with a remote history of early stage endometrial cancer, subsequently status post right breast upper outer quadrant biopsy 01/22/2016 for a clinically multifocal T2 N0, stage 2A invasive ductal carcinoma, grade 1, estrogen and  progesterone receptor positive, HER-2 negative, with an MIB-1 between 10 and 15%.  (1) right axillary lymph node biopsy 03/02/2016 positive  (2) genetics testing 01/13/2016 through the Custom gene panel offered by [facility] found no deleterious mutations in  ATM, BARD1, BRCA1, BRCA2, BRIP1, CDH1, CHEK2, EPCAM, FANCC, MLH1, MSH2, MSH6, MUTYH, NBN, PALB2, PMS2, POLD1, PTEN, RAD51C, RAD51D, TP53, and XRCC2  METASTATIC DISEASE: OCT 2017 (3) CT scans of the chest abdomen and pelvis obtained 03/10/2016 are consistent with bilateral lung metastases and mediastinal and hilar nodal involvement, but no liver or bone spread  (a) bronchoscopic lymph node biopsy 2 (station 7, 13R) 03/28/2016 confirms metastatic adenocarcinoma, estrogen receptor positive, HER-2 not amplified  (b) baseline CA-27-29 on 04/18/2016 was 137.5.  (4) letrozole started 03/15/2016, palbociclib added 03/29/2016 at 125 mg/day, 21/7  (a) dose decreased to 100 mg per day, 21/7, beginning with February cycle  (b) palbociclib held 01/31/2017, with increasing symptoms  (c) palbociclib resumed October 2018 at 75 mg daily  (d) palbociclib dose reduced to 75 mg every other day February through April 2019  (e) palbociclib dose resumed at 75 mg daily as of 10/17/2017  (f) palbociclib dose decreased to 75 mg every other day beginning 07/31/2019  (g) palbociclib resumed at 75 mg daily, 21 days on 7 off, as of 08/25/2019  (5) status post double right lumpectomies and right axillary lymph node sampling 01/13/2017 for 2 separate invasive ductal carcinoma lesions, pT1a and pT1b, N1a, with negative margins, both lesions being estrogen and progesterone receptor positive and HER-2 negative  (6) adjuvant radiation completed 07/05/2017 1. 50.4 Gy in 28 fractions to the right breast and supraclavicular region using whole-breast tangent fields. 2. Boost to the seroma delivered an additional 10 Gy in 5 fractions. The total dose was 60.4 Gy.  (7) restaging  studies:  (a) CT scan of the chest and bone scan 06/23/2017 showed stable scattered very small lung nodules, no bone lesions  (b) CT of the chest 10/17/2017 showed no new or progressive metastatic disease in the chest. The small left lower lobe pulmonary nodule is stable  (c) PET scan on 03/02/2018: shows no findings for residual or recurrent right breast cancer   (8) right upper pole renal lesion noted to be enlarging on CT scan 06/22/2017  (a) no uptake on PET scan obtained 03/02/2018   PLAN: Quinetta is just about 4 years out from definitive diagnosis of metastatic breast cancer.  Her disease appears to be well controlled and as best as I can tell she does not have any symptoms related to it.  She is also tolerating the treatment well.  She has  cytopenias from the palbociclib but no nausea or significant fatigue.  She does not have hot flashes or significant vaginal dryness from the letrozole.  She is wanting to feel better which I think is a very good goal.  I think good walking program and a strict diet would gradually get her there.  She is hoping to make some changes in her blood pressure medicines and she is going to be discussing that with her primary care physician.  She does have a yeast infection under the right inframammary fold and we discussed ketoconazole cream which I am prescribing for her  Otherwise she will see me again in a month.  I am going to obtain a repeat CT of the chest prior to that visit to restage her  Total encounter time 35 minutes.*  Goebel Hellums, Virgie Dad, MD  03/10/20 12:12 PM Medical Oncology and Hematology [facility] [place],  [address] Tel. [phone]    Fax. [phone]   I, Wilburn Mylar, am acting as scribe for Dr. Virgie Dad. Haylin Camilli.  I, Lurline Del MD, have reviewed the above documentation for accuracy and completeness, and I agree with the above.   *Total Encounter Time as defined by the Centers for  Medicare and Medicaid Services includes, in addition to the face-to-face time of a patient visit (documented in the note above) non-face-to-face time: obtaining and reviewing outside history, ordering and reviewing medications, tests or procedures, care coordination (communications with other health care professionals or caregivers) and documentation in the medical record.

## 2020-03-11 ENCOUNTER — Telehealth: Payer: Self-pay | Admitting: Oncology

## 2020-03-11 LAB — CANCER ANTIGEN 27.29: CA 27.29: 27 U/mL (ref 0.0–38.6)

## 2020-03-11 NOTE — Telephone Encounter (Signed)
Scheduled appts per 9/28 los. Pt confirmed appt date and time.

## 2020-03-17 ENCOUNTER — Other Ambulatory Visit: Payer: Self-pay

## 2020-03-17 ENCOUNTER — Encounter: Payer: Self-pay | Admitting: Internal Medicine

## 2020-03-17 ENCOUNTER — Ambulatory Visit (INDEPENDENT_AMBULATORY_CARE_PROVIDER_SITE_OTHER): Payer: Medicare Other | Admitting: Internal Medicine

## 2020-03-17 VITALS — BP 100/68 | HR 68 | Temp 98.7°F | Ht 64.0 in | Wt 238.0 lb

## 2020-03-17 DIAGNOSIS — K219 Gastro-esophageal reflux disease without esophagitis: Secondary | ICD-10-CM | POA: Diagnosis not present

## 2020-03-17 DIAGNOSIS — E782 Mixed hyperlipidemia: Secondary | ICD-10-CM

## 2020-03-17 DIAGNOSIS — I10 Essential (primary) hypertension: Secondary | ICD-10-CM | POA: Diagnosis not present

## 2020-03-17 DIAGNOSIS — E1165 Type 2 diabetes mellitus with hyperglycemia: Secondary | ICD-10-CM

## 2020-03-17 LAB — POCT GLYCOSYLATED HEMOGLOBIN (HGB A1C): Hemoglobin A1C: 6.7 % — AB (ref 4.0–5.6)

## 2020-03-17 MED ORDER — LISINOPRIL 5 MG PO TABS: 5.0000 mg | ORAL_TABLET | Freq: Every day | ORAL | 3 refills | Status: DC

## 2020-03-17 MED ORDER — LOVASTATIN 20 MG PO TABS
10.0000 mg | ORAL_TABLET | Freq: Every day | ORAL | 3 refills | Status: DC
Start: 2020-03-17 — End: 2020-07-22

## 2020-03-17 NOTE — Patient Instructions (Addendum)
Ok to stop the amlodopine, the lisinopril 40 mg, the hCT fluid pill, and the metformin ER 500 mg since the A1c was 6.7  Please take all new medication as prescribed - the very low dose lisinopril 5 mg per day  Ok to take Half of the statin for now  Please continue all other medications as before, and refills have been done if requested.  Please have the pharmacy call with any other refills you may need.  Please continue your efforts at being more active, low cholesterol diet, and weight control.  Please keep your appointments with your specialists as you may have planned  Please make an Appointment to return in 6 months, or sooner if needed

## 2020-03-17 NOTE — Progress Notes (Signed)
Subjective:    Patient ID: [name], female    DOB: 1945-08-09, 74 y.o.   MRN: [id number]  HPI  Here to f/u; overall doing ok,  Pt denies chest pain, increasing sob or doe, wheezing, orthopnea, PND, increased LE swelling, palpitations, dizziness or syncope.  Pt denies new neurological symptoms such as new headache, or facial or extremity weakness or numbness.  Pt denies polydipsia, polyuria, or low sugar episode.  Pt states overall good compliance with meds, mostly trying to follow appropriate diet, with wt overall stable,  but little exercise however. Has been on statin for many years, now after getting over her cancer tx side effects, now thinks her back pain may be a side effect, and also has HAs nighttime with getting up may be related to the amlodipine and lowr BP recent, also with some wt loss.  Avoids cooking lately due to hair thinning and even falling in the food.peak wt in the past as much as 252, most recently has lost about 17 lbs.  Denies worsening reflux, abd pain, dysphagia, n/v, bowel change or blood. Wt Readings from Last 3 Encounters:  03/17/20 238 lb (108 kg)  03/10/20 238 lb 8 oz (108.2 kg)  12/17/19 235 lb 9.6 oz (106.9 kg)  BP has been on lower side at home, currently actually only taking 20 mg lisinorpil since sept 28.  BP Readings from Last 3 Encounters:  03/17/20 100/68  03/10/20 (!) 107/91  12/17/19 (!) 163/76   Past Medical History:  Diagnosis Date   Breast cancer ([place])    Cancer ([place]) 02/2016   right breast   DIABETES MELLITUS, TYPE II 01/04/2007   only takes actoplus daily   Dizziness and giddiness 02/29/2008   DVT, HX OF    at age 56 in right buttocks   Dyspnea    due to lung cancer   Family history of breast cancer    GERD 01/04/2007   pt reports resolved    GLAUCOMA 07/30/2008   both eyes   History of blood transfusion    no abnormal  reaction   History of uterine cancer 2000   hysterectomy done   HYPERLIPIDEMIA 01/04/2007   taking  Pravastatin daily   HYPERTENSION 01/04/2007   takes Lisinopril daily   Joint pain    Joint swelling    Leg cramps    LEG PAIN, LEFT 07/06/2007   NUMBNESS 07/30/2008   in fingers;pt states from [facility]   OSTEOARTHRITIS, HIP 09/25/2009   OTITIS MEDIA, ACUTE, BILATERAL 02/29/2008   Overweight(278.02) 01/04/2007   Peripheral vascular disease ([place])    Personal history of radiation therapy 2018   Pneumonia    PONV (postoperative nausea and vomiting)    SLEEP APNEA, OBSTRUCTIVE    doesn't use a cpap;study done about 64yrs ago   [place], HX OF 01/04/2007   Vision loss    left eye   Past Surgical History:  Procedure Laterality Date   ABDOMINAL HYSTERECTOMY  2000   BREAST LUMPECTOMY Right 01/13/2017   x2   BREAST LUMPECTOMY WITH RADIOACTIVE SEED AND SENTINEL LYMPH NODE BIOPSY Right 01/13/2017   Procedure: RIGHT BREAST RADIOACTIVE SEED X'S 2 GUIDED LUMPECTOMY WITH RADIOACTIVE SEED TARGETED AXILLARYLYMPH NODE EXCISION AND RIGHT AXILLARY SENTINEL LYMPH NODE BIOPSY;  Surgeon: Alphonsa Overall, MD;  Location: [facility];  Service: General;  Laterality: Right;  2 SEEDS IN RIGHT BREAST 1 SEED IN RIGHT AXILLARY NODE   CHOLECYSTECTOMY     ENDOBRONCHIAL ULTRASOUND Bilateral 03/28/2016  Procedure: ENDOBRONCHIAL ULTRASOUND;  Surgeon: Collene Gobble, MD;  Location: [facility];  Service: Cardiopulmonary;  Laterality: Bilateral;   EYE SURGERY  13   shunt left and lazer eye surgery on right cataract and retenia tear with repair   growth removal  2004   from thumb   KNEE ARTHROSCOPY Right    mulitple eye surgeries     both eyes, cataracts with ioc done both eyes   OOPHORECTOMY     right lumpectomy with axillary node dissection Right 01/2017   TOTAL HIP ARTHROPLASTY  06/24/2011   Procedure: TOTAL HIP ARTHROPLASTY;  Surgeon: Kerin Salen;  Location: [facility];  Service: Orthopedics;  Laterality: Right;   TOTAL HIP ARTHROPLASTY Left 11/12/2012   Dr Mayer Camel   TOTAL HIP  ARTHROPLASTY Left 11/12/2012   Procedure: TOTAL HIP ARTHROPLASTY;  Surgeon: Kerin Salen, MD;  Location: [facility];  Service: Orthopedics;  Laterality: Left;  DEPUY PINNACLE    reports that she has never smoked. She has never used smokeless tobacco. She reports that she does not drink alcohol and does not use drugs. family history includes Breast cancer (age of onset: 55) in her sister; Cancer in her mother; Dementia in her mother; Glaucoma in her maternal grandfather; Heart attack in her maternal aunt; Lung cancer in her maternal grandmother; Stroke in her sister. Allergies  Allergen Reactions   Codeine Hives    Hycodan syrup   Fluorescein Nausea And Vomiting    ? IV dye for retina specialist   Lipitor [Atorvastatin Calcium]     Leg cramp   Oxycodone Nausea And Vomiting    Patient vomited for 3 days after taking   Sitagliptin Phosphate Nausea And Vomiting   Sulfa Drugs Cross Reactors Nausea And Vomiting   Current Outpatient Medications on File Prior to Visit  Medication Sig Dispense Refill   acetaminophen (TYLENOL) 500 MG tablet Take 1,000 mg by mouth every 4 (four) hours as needed for moderate pain or fever.     aspirin EC 81 MG tablet Take 81 mg by mouth daily at 6 PM. 1700     Biotin 1 MG CAPS Take by mouth.     brimonidine (ALPHAGAN) 0.2 % ophthalmic solution 1 drop 3 (three) times daily.     brimonidine-timolol (COMBIGAN) 0.2-0.5 % ophthalmic solution Place 1 drop into both eyes three times daily     cholecalciferol (VITAMIN D) 1000 units tablet Take 1,000 Units by mouth daily.     dorzolamide-timolol (COSOPT) 22.3-6.8 MG/ML ophthalmic solution Place 1 drop into both eyes 2 (two) times daily.     ketoconazole (NIZORAL) 2 % cream Apply 1 application topically daily. 15 g 0   ketorolac (ACULAR) 0.5 % ophthalmic solution Place 1 drop into the right eye 2 (two) times daily.     letrozole (FEMARA) 2.5 MG tablet Take 1 tablet (2.5 mg total) by mouth at bedtime. 90 tablet 4    palbociclib (IBRANCE) 75 MG tablet TAKE 1 TABLET (75 MG TOTAL) BY MOUTH DAILY. TAKE FOR 21 DAYS ON, 7 DAYS OFF, REPEAT EVERY 28 DAYS. 21 tablet 6   pioglitazone (ACTOS) 15 MG tablet Take 1 tablet (15 mg total) by mouth daily. 90 tablet 3   prednisoLONE acetate (PRED FORTE) 1 % ophthalmic suspension SMARTSIG:1 In Eye(s) 6 Times Daily     No current facility-administered medications on file prior to visit.   Review of Systems All otherwise neg per pt    Objective:   Physical Exam BP 100/68 (BP Location:  Left Arm, Patient Position: Sitting, Cuff Size: Large)    Pulse 68    Temp 98.7 F (37.1 C) (Oral)    Ht 5\' 4"  (1.626 m)    Wt 238 lb (108 kg)    SpO2 97%    BMI 40.85 kg/m  VS noted,  Constitutional: Pt appears in NAD HENT: Head: NCAT.  Right Ear: External ear normal.  Left Ear: External ear normal.  Eyes: . Pupils are equal, round, and reactive to light. Conjunctivae and EOM are normal Nose: without d/c or deformity Neck: Neck supple. Gross normal ROM Cardiovascular: Normal rate and regular rhythm.   Pulmonary/Chest: Effort normal and breath sounds without rales or wheezing.  Abd:  Soft, NT, ND, + BS, no organomegaly Neurological: Pt is alert. At baseline orientation, motor grossly intact Skin: Skin is warm. No rashes, other new lesions, no LE edema Psychiatric: Pt behavior is normal without agitation  All otherwise neg per pt  Lab Results  Component Value Date   WBC 2.2 (L) 03/10/2020   HGB 11.4 (L) 03/10/2020   HCT 34.1 (L) 03/10/2020   PLT 129 (L) 03/10/2020   GLUCOSE 217 (H) 03/10/2020   CHOL 195 04/29/2015   TRIG 129.0 04/29/2015   HDL 52.40 04/29/2015   LDLCALC 117 (H) 04/29/2015   ALT 14 03/10/2020   AST 12 (L) 03/10/2020   NA 138 03/10/2020   K 4.3 03/10/2020   CL 106 03/10/2020   CREATININE 0.83 03/10/2020   BUN 12 03/10/2020   CO2 27 03/10/2020   TSH 1.172 09/24/2018   INR 1.06 11/08/2012   HGBA1C 6.7 (A) 09/04/2018   MICROALBUR <0.7 10/30/2014    Hgba1c - poct today -  Contains abnormal dataPOCT HgB A1C Order: [id number] Status:  Final result Visible to patient:  No (scheduled for 03/17/2020 1:34 PM) Dx:  Type 2 diabetes mellitus with hypergl...  0 Result Notes   1 HM Topic  Ref Range & Units 12:32 1 yr ago 2 yr ago 3 yr ago 4 yr ago  Hemoglobin A1C 4.0 - 5.6 % 6.7Abnormal  6.7Abnormal  6.7 R  6.7High R, CM  7.3High R,            Assessment & Plan:

## 2020-03-22 ENCOUNTER — Encounter: Payer: Self-pay | Admitting: Internal Medicine

## 2020-03-22 NOTE — Assessment & Plan Note (Addendum)
stable overall by history and exam, recent data reviewed with pt, and pt to continue medical treatment as before except ok stop metformin with recent wt loss,  to f/u any worsening symptoms or concerns  I spent 31 minutes in preparing to see the patient by review of recent labs, imaging and procedures, obtaining and reviewing separately obtained history, communicating with the patient and family or caregiver, ordering medications, tests or procedures, and documenting clinical information in the EHR including the differential Dx, treatment, and any further evaluation and other management of htn, dm, hld, gerd

## 2020-03-22 NOTE — Assessment & Plan Note (Signed)
stable overall by history and exam, recent data reviewed with pt, and pt to continue medical treatment as before,  to f/u any worsening symptoms or concerns  

## 2020-03-22 NOTE — Assessment & Plan Note (Signed)
Ok to take hALF statin per pt request as she believes it may cause dizziness

## 2020-03-22 NOTE — Assessment & Plan Note (Addendum)
Overcontrolled, to d/c amlodipine and hct, change lisinopril to 5 qd only

## 2020-03-31 ENCOUNTER — Telehealth: Payer: Self-pay

## 2020-03-31 NOTE — Telephone Encounter (Signed)
This LPN called pt to make her aware that CT chest was scheduled for 04/03/20 at 1315. Pr understands to arrive at 1315 for 1330 scan and that she should have liquids only 4 hours prior to CT. Pt will f/u with Dr Jana Hakim 10/26.

## 2020-04-03 ENCOUNTER — Ambulatory Visit (HOSPITAL_COMMUNITY)
Admission: RE | Admit: 2020-04-03 | Discharge: 2020-04-03 | Disposition: A | Discharge status: Home / Self Care | Payer: Medicare Other | Source: Ambulatory Visit | Attending: Oncology | Admitting: Oncology

## 2020-04-03 ENCOUNTER — Other Ambulatory Visit: Payer: Self-pay

## 2020-04-03 ENCOUNTER — Encounter (HOSPITAL_COMMUNITY): Payer: Self-pay

## 2020-04-03 DIAGNOSIS — C7800 Secondary malignant neoplasm of unspecified lung: Secondary | ICD-10-CM | POA: Diagnosis not present

## 2020-04-03 DIAGNOSIS — Z170 Estrogen receptor positive status [ER+]: Secondary | ICD-10-CM | POA: Insufficient documentation

## 2020-04-03 DIAGNOSIS — Z7189 Other specified counseling: Secondary | ICD-10-CM | POA: Insufficient documentation

## 2020-04-03 DIAGNOSIS — I2510 Atherosclerotic heart disease of native coronary artery without angina pectoris: Secondary | ICD-10-CM | POA: Diagnosis not present

## 2020-04-03 DIAGNOSIS — G620 Drug-induced polyneuropathy: Secondary | ICD-10-CM

## 2020-04-03 DIAGNOSIS — C50411 Malignant neoplasm of upper-outer quadrant of right female breast: Secondary | ICD-10-CM

## 2020-04-03 DIAGNOSIS — T451X5A Adverse effect of antineoplastic and immunosuppressive drugs, initial encounter: Secondary | ICD-10-CM | POA: Insufficient documentation

## 2020-04-03 DIAGNOSIS — C50919 Malignant neoplasm of unspecified site of unspecified female breast: Secondary | ICD-10-CM | POA: Diagnosis not present

## 2020-04-03 DIAGNOSIS — I700 Atherosclerosis of aorta: Secondary | ICD-10-CM | POA: Diagnosis not present

## 2020-04-03 MED ORDER — IOHEXOL 300 MG/ML  SOLN
75.0000 mL | Freq: Once | INTRAMUSCULAR | Status: AC | PRN
  Administered 2020-04-03: 75 mL via INTRAVENOUS

## 2020-04-06 NOTE — Progress Notes (Signed)
[place]  [phone] [phone]     ID: LASHE OLIVEIRA DOB: 1946/01/25  MR#: [id number]  VQQ#:[id number]  Patient Care Team: Biagio Borg, MD as PCP - General (Internal Medicine) Alphonsa Overall, MD as Consulting Physician (General Surgery) Flordia Kassem, Virgie Dad, MD as Consulting Physician (Oncology) Kyung Rudd, MD as Consulting Physician (Radiation Oncology) Bobbye Charleston, MD as Consulting Physician (Obstetrics and Gynecology) Nada Libman, MD as Referring Physician (Specialist) Collene Gobble, MD as Consulting Physician (Pulmonary Disease) Alexis Frock, MD as Consulting Physician (Urology) Ander Slade, Carlisle Beers, MD as Referring Physician (Ophthalmology) OTHER MD:   CHIEF COMPLAINT: Estrogen receptor positive breast cancer  CURRENT TREATMENT: Letrozole, palbociclib   INTERVAL HISTORY: Tasneem returns today for follow-up of her estrogen receptor positive stage IV breast cancer.  She is accompanied by [name]  Since her last visit, she underwent restaging chest CT on 04/03/2020 showing: stable, no specific findings to suggest residual or recurrence of tumor or metastatic disease; increase in size of hyperdense lesion arising from upper pole of right kidney, favored benign.  She continues on letrozole.  She has mild hot flashes and her hair is thinning some.  Otherwise she tolerates this well.  She does very well with the palbociclib also.  She is currently on 75 mg daily, 21 days on 7 days off and tolerating this well.  We are continuing to follow her tumor marker: Lab Results  Component Value Date   CA2729 27.0 03/10/2020   CA2729 39.1 (H) 12/17/2019   CA2729 37.6 11/19/2019   CA2729 31.8 09/24/2019   CA2729 38.3 08/27/2019    REVIEW OF SYSTEMS: Vercie had very many questions about her scan.  She understands that is favorable but she really wanted to understand every part of it for example what is a hiatal hernia and  does it matter and so on.  She is doing her best to lose weight.  She is finding it more difficult than she anticipated.  She would like to get off more medications and is very pleased that her primary care physician took her off some of the blood pressure medicines.  She is doing some walking and tells me her most recent A1c was 6.7.  She has a little bit of phlegm in the morning.  She has a little headache here and there but really there are no significant symptoms associated with her cancer or its treatment.  A detailed review of systems today was otherwise stable   BREAST CANCER HISTORY: From the original intake note:  Clariece had screening mammography showing some suspicious calcifications in the right breast leading to right diagnostic mammography with ultrasonography 01/22/2016 at [facility]. The breast density was category C. In the upper right breast there was a 2.3 cm mass with additional masses measuring 0.9 and 0.7 cm. There was also a possible additional 0.8 mass in the lower inner quadrant. Ultrasound confirmed an irregular hypoechoic mass in the right breast upper outer quadrant measuring 2.0 cm. There were other masses measuring 0.7 and 0.8 cm by ultrasonography. The right axilla was sonographically benign.  Biopsy of a 12:00 and 4:00 mass in the right breast 01/22/2016 showed ([id number]) both specimens showing invasive ductal carcinoma, grade 1 or 2, both 95% estrogen receptor positive, both 95% progesterone receptor positive, both with strong staining intensity, with MIB-1 ranging from 10-15%, and both HER-2 negative, the signals ratio being 1.23-1.42, and the number per cell 1.85-2.59.  Her subsequent history is as detailed  below   PAST MEDICAL HISTORY: Past Medical History:  Diagnosis Date  . Breast cancer ([place])   . Cancer ([place]) 02/2016   right breast  . DIABETES MELLITUS, TYPE II 01/04/2007   only takes actoplus daily  . Dizziness and giddiness 02/29/2008  . DVT, HX OF    at age  74 in right buttocks  . Dyspnea    due to lung cancer  . Family history of breast cancer   . GERD 01/04/2007   pt reports resolved   . GLAUCOMA 07/30/2008   both eyes  . History of blood transfusion    no abnormal  reaction  . History of uterine cancer 2000   hysterectomy done  . HYPERLIPIDEMIA 01/04/2007   taking Pravastatin daily  . HYPERTENSION 01/04/2007   takes Lisinopril daily  . Joint pain   . Joint swelling   . Leg cramps   . LEG PAIN, LEFT 07/06/2007  . NUMBNESS 07/30/2008   in fingers;pt states from Diamox  . OSTEOARTHRITIS, HIP 09/25/2009  . OTITIS MEDIA, ACUTE, BILATERAL 02/29/2008  . Overweight(278.02) 01/04/2007  . Peripheral vascular disease ([place])   . Personal history of radiation therapy 2018  . Pneumonia   . PONV (postoperative nausea and vomiting)   . SLEEP APNEA, OBSTRUCTIVE    doesn't use a cpap;study done about 66yrs ago  . TRANSIENT ISCHEMIC ATTACK, HX OF 01/04/2007  . Vision loss    left eye    PAST SURGICAL HISTORY: Past Surgical History:  Procedure Laterality Date  . ABDOMINAL HYSTERECTOMY  2000  . BREAST LUMPECTOMY Right 01/13/2017   x2  . BREAST LUMPECTOMY WITH RADIOACTIVE SEED AND SENTINEL LYMPH NODE BIOPSY Right 01/13/2017   Procedure: RIGHT BREAST RADIOACTIVE SEED X'S 2 GUIDED LUMPECTOMY WITH RADIOACTIVE SEED TARGETED AXILLARYLYMPH NODE EXCISION AND RIGHT AXILLARY SENTINEL LYMPH NODE BIOPSY;  Surgeon: Alphonsa Overall, MD;  Location: [facility];  Service: General;  Laterality: Right;  2 SEEDS IN RIGHT BREAST 1 SEED IN RIGHT AXILLARY NODE  . CHOLECYSTECTOMY    . ENDOBRONCHIAL ULTRASOUND Bilateral 03/28/2016   Procedure: ENDOBRONCHIAL ULTRASOUND;  Surgeon: Collene Gobble, MD;  Location: [facility];  Service: Cardiopulmonary;  Laterality: Bilateral;  . EYE SURGERY  13   shunt left and lazer eye surgery on right cataract and retenia tear with repair  . growth removal  2004   from thumb  . KNEE ARTHROSCOPY Right   . mulitple eye surgeries     both eyes,  cataracts with ioc done both eyes  . OOPHORECTOMY    . right lumpectomy with axillary node dissection Right 01/2017  . TOTAL HIP ARTHROPLASTY  06/24/2011   Procedure: TOTAL HIP ARTHROPLASTY;  Surgeon: Kerin Salen;  Location: [facility];  Service: Orthopedics;  Laterality: Right;  . TOTAL HIP ARTHROPLASTY Left 11/12/2012   Dr Mayer Camel  . TOTAL HIP ARTHROPLASTY Left 11/12/2012   Procedure: TOTAL HIP ARTHROPLASTY;  Surgeon: Kerin Salen, MD;  Location: [facility];  Service: Orthopedics;  Laterality: Left;  DEPUY PINNACLE    FAMILY HISTORY Family History  Problem Relation Age of Onset  . Dementia Mother   . Cancer Mother        Breast and lung cancer  . Stroke Sister   . Breast cancer Sister 7  . Heart attack Maternal Aunt   . Lung cancer Maternal Grandmother        non smoker  . Glaucoma Maternal Grandfather   . Anesthesia problems Neg Hx    The patient's father died  at age 53, the patient's mother died at age 86. She had breast and lung cancers diagnosed shortly before her death. The patient had no brothers, 2 sisters. One sister was diagnosed with breast cancer at the age of 31.   GYNECOLOGIC HISTORY:  No LMP recorded. Patient has had a hysterectomy. Menarche age 46, first live birth age 46, the patient is GX P1. She had a hysterectomy for endometrial cancer in the year 2000. She did not take hormone replacement. She did use oral contraceptives for more than 20 years remotely, with no complications.   SOCIAL HISTORY: (Updated July 2021). 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 She is home with [name]. He is a retired Dealer.Their son [name] also lives in [place].  The patient has 3 grandchildren aged 28, 26 and 77    ADVANCED DIRECTIVES: In place   HEALTH MAINTENANCE: Social History   Tobacco Use  . Smoking status: Never Smoker  . Smokeless tobacco: Never Used  Vaping Use  . Vaping Use: Never used    Substance Use Topics  . Alcohol use: No  . Drug use: No     Colonoscopy: Never  PAP: Status post hysterectomy  Bone density: Remote   Allergies  Allergen Reactions  . Codeine Hives    Hycodan syrup  . Fluorescein Nausea And Vomiting    ? IV dye for retina specialist  . Lipitor [Atorvastatin Calcium]     Leg cramp  . Oxycodone Nausea And Vomiting    Patient vomited for 3 days after taking  . Sitagliptin Phosphate Nausea And Vomiting  . Sulfa Drugs Cross Reactors Nausea And Vomiting    Current Outpatient Medications  Medication Sig Dispense Refill  . acetaminophen (TYLENOL) 500 MG tablet Take 1,000 mg by mouth every 4 (four) hours as needed for moderate pain or fever.    Marland Kitchen aspirin EC 81 MG tablet Take 81 mg by mouth daily at 6 PM. 1700    . Biotin 1 MG CAPS Take by mouth.    . brimonidine (ALPHAGAN) 0.2 % ophthalmic solution 1 drop 3 (three) times daily.    . brimonidine-timolol (COMBIGAN) 0.2-0.5 % ophthalmic solution Place 1 drop into both eyes three times daily    . cholecalciferol (VITAMIN D) 1000 units tablet Take 1,000 Units by mouth daily.    . dorzolamide-timolol (COSOPT) 22.3-6.8 MG/ML ophthalmic solution Place 1 drop into both eyes 2 (two) times daily.    Marland Kitchen ketoconazole (NIZORAL) 2 % cream Apply 1 application topically daily. 15 g 0  . ketorolac (ACULAR) 0.5 % ophthalmic solution Place 1 drop into the right eye 2 (two) times daily.    Marland Kitchen letrozole (FEMARA) 2.5 MG tablet Take 1 tablet (2.5 mg total) by mouth at bedtime. 90 tablet 4  . lisinopril (ZESTRIL) 5 MG tablet Take 1 tablet (5 mg total) by mouth daily. 90 tablet 3  . lovastatin (MEVACOR) 20 MG tablet Take 0.5 tablets (10 mg total) by mouth at bedtime. 90 tablet 3  . palbociclib (IBRANCE) 75 MG tablet TAKE 1 TABLET (75 MG TOTAL) BY MOUTH DAILY. TAKE FOR 21 DAYS ON, 7 DAYS OFF, REPEAT EVERY 28 DAYS. 21 tablet 6  . pioglitazone (ACTOS) 15 MG tablet Take 1 tablet (15 mg total) by mouth daily. 90 tablet 3  .  prednisoLONE acetate (PRED FORTE) 1 % ophthalmic suspension SMARTSIG:1 In Eye(s) 6 Times Daily     No current  facility-administered medications for this visit.     OBJECTIVE: white woman who appears stated age  74:   04/07/20 1140  BP: (!) 176/50  Pulse: 66  Resp: 18  Temp: (!) 97 F (36.1 C)  SpO2: 100%   Wt Readings from Last 3 Encounters:  04/07/20 238 lb 14.4 oz (108.4 kg)  03/17/20 238 lb (108 kg)  03/10/20 238 lb 8 oz (108.2 kg)   Body mass index is 41.01 kg/m.    ECOG FS:1 - Symptomatic but completely ambulatory  Sclerae unicteric, EOMs intact Wearing a mask No cervical or supraclavicular adenopathy Lungs no rales or rhonchi Heart regular rate and rhythm Abd soft, nontender, positive bowel sounds MSK no focal spinal tenderness, no upper extremity lymphedema Neuro: nonfocal, well oriented, appropriate affect Breasts: The right breast is status post lumpectomy and radiation.  There is no evidence of local recurrence.  The left breast is benign.  Both axillae are benign.   LAB RESULTS:  CMP     Component Value Date/Time   NA 139 04/07/2020 1119   NA 140 05/29/2017 1254   K 4.3 04/07/2020 1119   K 4.4 05/29/2017 1254   CL 107 04/07/2020 1119   CO2 26 04/07/2020 1119   CO2 25 05/29/2017 1254   GLUCOSE 204 (H) 04/07/2020 1119   GLUCOSE 154 (H) 05/29/2017 1254   BUN 13 04/07/2020 1119   BUN 11.5 05/29/2017 1254   CREATININE 0.81 04/07/2020 1119   CREATININE 0.83 03/10/2020 1138   CREATININE 0.8 05/29/2017 1254   CALCIUM 9.4 04/07/2020 1119   CALCIUM 9.3 05/29/2017 1254   PROT 6.8 04/07/2020 1119   PROT 7.0 05/29/2017 1254   ALBUMIN 4.0 04/07/2020 1119   ALBUMIN 4.1 05/29/2017 1254   AST 13 (L) 04/07/2020 1119   AST 12 (L) 03/10/2020 1138   AST 13 05/29/2017 1254   ALT 15 04/07/2020 1119   ALT 14 03/10/2020 1138   ALT 14 05/29/2017 1254   ALKPHOS 64 04/07/2020 1119   ALKPHOS 66 05/29/2017 1254   BILITOT 0.5 04/07/2020 1119   BILITOT 0.4  03/10/2020 1138   BILITOT 0.50 05/29/2017 1254   GFRNONAA >60 04/07/2020 1119   GFRNONAA >60 03/10/2020 1138   GFRAA >60 03/10/2020 1138    INo results found for: SPEP, UPEP  Lab Results  Component Value Date   WBC 2.3 (L) 04/07/2020   NEUTROABS 1.2 (L) 04/07/2020   HGB 12.0 04/07/2020   HCT 35.5 (L) 04/07/2020   MCV 95.4 04/07/2020   PLT 141 (L) 04/07/2020      Chemistry      Component Value Date/Time   NA 139 04/07/2020 1119   NA 140 05/29/2017 1254   K 4.3 04/07/2020 1119   K 4.4 05/29/2017 1254   CL 107 04/07/2020 1119   CO2 26 04/07/2020 1119   CO2 25 05/29/2017 1254   BUN 13 04/07/2020 1119   BUN 11.5 05/29/2017 1254   CREATININE 0.81 04/07/2020 1119   CREATININE 0.83 03/10/2020 1138   CREATININE 0.8 05/29/2017 1254      Component Value Date/Time   CALCIUM 9.4 04/07/2020 1119   CALCIUM 9.3 05/29/2017 1254   ALKPHOS 64 04/07/2020 1119   ALKPHOS 66 05/29/2017 1254   AST 13 (L) 04/07/2020 1119   AST 12 (L) 03/10/2020 1138   AST 13 05/29/2017 1254   ALT 15 04/07/2020 1119   ALT 14 03/10/2020 1138   ALT 14 05/29/2017 1254   BILITOT 0.5 04/07/2020 1119   BILITOT  0.4 03/10/2020 1138   BILITOT 0.50 05/29/2017 1254       No results found for: LABCA2  No components found for: [id number]  No results for input(s): INR in the last 168 hours.  Urinalysis    Component Value Date/Time   COLORURINE YELLOW 08/18/2017 1322   APPEARANCEUR HAZY (A) 08/18/2017 1322   LABSPEC 1.010 08/18/2017 1322   PHURINE 7.0 08/18/2017 1322   GLUCOSEU NEGATIVE 08/18/2017 1322   GLUCOSEU NEGATIVE 10/30/2014 1513   HGBUR LARGE (A) 08/18/2017 1322   BILIRUBINUR NEGATIVE 08/18/2017 1322   KETONESUR NEGATIVE 08/18/2017 1322   PROTEINUR 30 (A) 08/18/2017 1322   UROBILINOGEN 0.2 10/30/2014 1513   NITRITE NEGATIVE 08/18/2017 1322   LEUKOCYTESUR LARGE (A) 08/18/2017 1322    STUDIES: CT Chest W Contrast  Result Date: 04/04/2020 CLINICAL DATA:  Restaging breast cancer. History of  lung metastases. EXAM: CT CHEST WITH CONTRAST TECHNIQUE: Multidetector CT imaging of the chest was performed during intravenous contrast administration. CONTRAST:  36mL OMNIPAQUE IOHEXOL 300 MG/ML  SOLN COMPARISON:  PET-CT 02/19/2019 and CT chest 10/17/2017. FINDINGS: Cardiovascular: Heart size appears within normal limits. No pericardial effusion. Aortic atherosclerosis. Coronary artery calcifications. Mediastinum/Nodes: Stable heterogeneous enlarged thyroid gland with scattered internal calcifications and asymmetric enlargement of the left thyroid lobe. The trachea appears patent and is midline.  Small hiatal hernia. No enlarged axillary, supraclavicular, mediastinal, or hilar lymph nodes. Lungs/Pleura: No pleural effusion. Unchanged appearance of right apical scarring. Mild subpleural fibrosis is identified within the anterior right upper lobe, likely reflecting changes from external beam radiation. No suspicious pulmonary nodule or mass. Upper Abdomen: Previous cholecystectomy. No acute findings. Previous cholecystectomy with chronic increase caliber of the common bile duct which measures 1.9 cm on today's study. Previously noted hyperdense right kidney lesion arising from the upper pole measures 2.9 x 2.8 cm and 41 Hounsfield units, image 140/2. On 10/17/2017 this measured 2.7 x 2.5 cm, image 130/2. Unchanged left adrenal nodule measuring 1 cm, image 144/2, compatible with a benign adenoma. Musculoskeletal: Post treatment changes involving the right breast are again noted including asymmetric skin thickening. Thoracic degenerative disc disease. IMPRESSION: 1. Stable CT of the chest. No specific findings identified to suggest residual or recurrence of tumor or metastatic disease. 2. Increase in size of hyperdense lesion arising from the upper pole of right kidney. Although this is favored to represent a benign hemorrhagic or proteinaceous cyst given the interval increase in size consider more definitive  characterization with nonemergent renal protocol MRI without and with contrast material. 3. Stable left adrenal nodule, compatible with a benign adenoma. 4. Coronary artery calcifications. 5. Aortic atherosclerosis. Aortic Atherosclerosis (ICD10-I70.0). Electronically Signed   By: Kerby Moors M.D.   On: 04/04/2020 10:54      ELIGIBLE FOR AVAILABLE RESEARCH PROTOCOL: no  ASSESSMENT: 74 y.o. [place] woman with a remote history of early stage endometrial cancer, subsequently status post right breast upper outer quadrant biopsy 01/22/2016 for a clinically multifocal T2 N0, stage 2A invasive ductal carcinoma, grade 1, estrogen and progesterone receptor positive, HER-2 negative, with an MIB-1 between 10 and 15%.  (1) right axillary lymph node biopsy 03/02/2016 positive  (2) genetics testing 01/13/2016 through the Custom gene panel offered by [facility] found no deleterious mutations in  ATM, BARD1, BRCA1, BRCA2, BRIP1, CDH1, CHEK2, EPCAM, FANCC, MLH1, MSH2, MSH6, MUTYH, NBN, PALB2, PMS2, POLD1, PTEN, RAD51C, RAD51D, TP53, and XRCC2  METASTATIC DISEASE: OCT 2017 (3) CT scans of the chest abdomen and pelvis obtained 03/10/2016 are consistent with  bilateral lung metastases and mediastinal and hilar nodal involvement, but no liver or bone spread  (a) bronchoscopic lymph node biopsy 2 (station 7, 13R) 03/28/2016 confirms metastatic adenocarcinoma, estrogen receptor positive, HER-2 not amplified  (b) baseline CA-27-29 on 04/18/2016 was 137.5.  (4) letrozole started 03/15/2016, palbociclib added 03/29/2016 at 125 mg/day, 21/7  (a) dose decreased to 100 mg per day, 21/7, beginning with February cycle  (b) palbociclib held 01/31/2017, with increasing symptoms  (c) palbociclib resumed October 2018 at 75 mg daily  (d) palbociclib dose reduced to 75 mg every other day February through April 2019  (e) palbociclib dose resumed at 75 mg daily as of 10/17/2017  (f) palbociclib dose decreased to 75 mg every  other day beginning 07/31/2019  (g) palbociclib resumed at 75 mg daily, 21 days on 7 off, as of 08/25/2019  (5) status post double right lumpectomies and right axillary lymph node sampling 01/13/2017 for 2 separate invasive ductal carcinoma lesions, pT1a and pT1b, N1a, with negative margins, both lesions being estrogen and progesterone receptor positive and HER-2 negative  (6) adjuvant radiation completed 07/05/2017 1. 50.4 Gy in 28 fractions to the right breast and supraclavicular region using whole-breast tangent fields. 2. Boost to the seroma delivered an additional 10 Gy in 5 fractions. The total dose was 60.4 Gy.  (7) restaging studies:  (a) CT scan of the chest and bone scan 06/23/2017 showed stable scattered very small lung nodules, no bone lesions  (b) CT of the chest 10/17/2017 showed no new or progressive metastatic disease in the chest. The small left lower lobe pulmonary nodule is stable  (c) PET scan on 03/02/2018: shows no findings for residual or recurrent right breast cancer   (8) right upper pole renal lesion noted to be enlarging on CT scan 06/22/2017  (a) no uptake on PET scan obtained 03/02/2018   PLAN: Shaunita is 4 years out from definitive diagnosis of her metastatic breast cancer.  Currently I do not note any symptoms related to her disease.  She is tolerating treatment quite well.  As noted above she was very interested in understanding her CAT scan and all its details.  We reviewed what aortic atherosclerosis means, what coronary calcifications mean, what hiatal hernia is and so on.  And then I think she felt reassured that there was no bad news and that study.  In fact she is doing quite well overall.  I have not had to change her palbociclib dose any further.  She will need however to continue monthly monitoring because were very close to going back to 75 mg every other day  She would prefer not to be seen so frequently.  That is fine and she knows to call for  problems if they occur of course.  Otherwise I will see her again in 12 weeks.  She will continue to have her labs every 4 weeks as before  Total encounter time 40 minutes.*   Brixton Schnapp, Valentino Hue, MD  04/07/20 3:37 PM Medical Oncology and Hematology [facility] [address] [place], [place] [address] Tel. [phone]    Fax. [phone]   I, Mickie Bail, am acting as scribe for Dr. Valentino Hue. Kaylena Pacifico.  I, Ruthann Cancer MD, have reviewed the above documentation for accuracy and completeness, and I agree with the above.   *Total Encounter Time as defined by the Centers for Medicare and Medicaid Services includes, in addition to the face-to-face time of a patient visit (documented in the note above)  non-face-to-face time: obtaining and reviewing outside history, ordering and reviewing medications, tests or procedures, care coordination (communications with other health care professionals or caregivers) and documentation in the medical record.

## 2020-04-07 ENCOUNTER — Inpatient Hospital Stay: Payer: Medicare Other | Attending: Oncology | Admitting: Oncology

## 2020-04-07 ENCOUNTER — Inpatient Hospital Stay: Payer: Medicare Other

## 2020-04-07 ENCOUNTER — Other Ambulatory Visit: Payer: Self-pay

## 2020-04-07 VITALS — BP 176/50 | HR 66 | Temp 97.0°F | Resp 18 | Ht 64.0 in | Wt 238.9 lb

## 2020-04-07 DIAGNOSIS — M1990 Unspecified osteoarthritis, unspecified site: Secondary | ICD-10-CM | POA: Diagnosis not present

## 2020-04-07 DIAGNOSIS — C7800 Secondary malignant neoplasm of unspecified lung: Secondary | ICD-10-CM | POA: Insufficient documentation

## 2020-04-07 DIAGNOSIS — G620 Drug-induced polyneuropathy: Secondary | ICD-10-CM

## 2020-04-07 DIAGNOSIS — E785 Hyperlipidemia, unspecified: Secondary | ICD-10-CM | POA: Diagnosis not present

## 2020-04-07 DIAGNOSIS — C50411 Malignant neoplasm of upper-outer quadrant of right female breast: Secondary | ICD-10-CM | POA: Diagnosis not present

## 2020-04-07 DIAGNOSIS — Z79811 Long term (current) use of aromatase inhibitors: Secondary | ICD-10-CM | POA: Diagnosis not present

## 2020-04-07 DIAGNOSIS — Z7982 Long term (current) use of aspirin: Secondary | ICD-10-CM | POA: Diagnosis not present

## 2020-04-07 DIAGNOSIS — Z79899 Other long term (current) drug therapy: Secondary | ICD-10-CM | POA: Diagnosis not present

## 2020-04-07 DIAGNOSIS — T451X5A Adverse effect of antineoplastic and immunosuppressive drugs, initial encounter: Secondary | ICD-10-CM

## 2020-04-07 DIAGNOSIS — Z170 Estrogen receptor positive status [ER+]: Secondary | ICD-10-CM

## 2020-04-07 DIAGNOSIS — I10 Essential (primary) hypertension: Secondary | ICD-10-CM | POA: Diagnosis not present

## 2020-04-07 DIAGNOSIS — Z923 Personal history of irradiation: Secondary | ICD-10-CM | POA: Insufficient documentation

## 2020-04-07 DIAGNOSIS — Z7189 Other specified counseling: Secondary | ICD-10-CM

## 2020-04-07 LAB — COMPREHENSIVE METABOLIC PANEL
ALT: 15 U/L (ref 0–44)
AST: 13 U/L — ABNORMAL LOW (ref 15–41)
Albumin: 4 g/dL (ref 3.5–5.0)
Alkaline Phosphatase: 64 U/L (ref 38–126)
Anion gap: 6 (ref 5–15)
BUN: 13 mg/dL (ref 8–23)
CO2: 26 mmol/L (ref 22–32)
Calcium: 9.4 mg/dL (ref 8.9–10.3)
Chloride: 107 mmol/L (ref 98–111)
Creatinine, Ser: 0.81 mg/dL (ref 0.44–1.00)
GFR, Estimated: >60 mL/min (ref >60)
Glucose, Bld: 204 mg/dL — ABNORMAL HIGH (ref 70–99)
Potassium: 4.3 mmol/L (ref 3.5–5.1)
Sodium: 139 mmol/L (ref 135–145)
Total Bilirubin: 0.5 mg/dL (ref 0.3–1.2)
Total Protein: 6.8 g/dL (ref 6.5–8.1)

## 2020-04-07 LAB — CBC WITH DIFFERENTIAL (CANCER CENTER ONLY)
Abs Immature Granulocytes: 0.02 10*3/uL (ref 0.00–0.07)
Basophils Absolute: 0 10*3/uL (ref 0.0–0.1)
Basophils Relative: 1 %
Eosinophils Absolute: 0 10*3/uL (ref 0.0–0.5)
Eosinophils Relative: 1 %
HCT: 35.5 % — ABNORMAL LOW (ref 36.0–46.0)
Hemoglobin: 12 g/dL (ref 12.0–15.0)
Immature Granulocytes: 1 %
Lymphocytes Relative: 30 %
Lymphs Abs: 0.7 10*3/uL (ref 0.7–4.0)
MCH: 32.3 pg (ref 26.0–34.0)
MCHC: 33.8 g/dL (ref 30.0–36.0)
MCV: 95.4 fL (ref 80.0–100.0)
Monocytes Absolute: 0.3 10*3/uL (ref 0.1–1.0)
Monocytes Relative: 15 %
Neutro Abs: 1.2 10*3/uL — ABNORMAL LOW (ref 1.7–7.7)
Neutrophils Relative %: 52 %
Platelet Count: 141 10*3/uL — ABNORMAL LOW (ref 150–400)
RBC: 3.72 MIL/uL — ABNORMAL LOW (ref 3.87–5.11)
RDW: 14.8 % (ref 11.5–15.5)
WBC Count: 2.3 10*3/uL — ABNORMAL LOW (ref 4.0–10.5)
nRBC: 0 % (ref 0.0–0.2)

## 2020-04-07 MED FILL — IBRANCE 75 MG TABS: 75 | 28 days supply | Qty: 21 | Fill #1

## 2020-04-08 LAB — CANCER ANTIGEN 27.29: CA 27.29: 40.4 U/mL — ABNORMAL HIGH (ref 0.0–38.6)

## 2020-04-11 DIAGNOSIS — Z23 Encounter for immunization: Secondary | ICD-10-CM | POA: Diagnosis not present

## 2020-04-22 ENCOUNTER — Telehealth: Payer: Self-pay

## 2020-04-22 NOTE — Telephone Encounter (Signed)
Pt called stating she is very upset because her insurance company has cancelled her for the 2022 year d/t her Ibrance. Pt attempted to call Benjamine Mola in financial assistance for help and pt states Benjamine Mola told her it is up to the pt to find insurance that will cover her. Essentially pt called stating she wanted to "vent to Val" but understands that Val is currently out of work. Pt is going to look into finding a new insurance company and will contact us if needed.

## 2020-04-30 DIAGNOSIS — H35351 Cystoid macular degeneration, right eye: Secondary | ICD-10-CM | POA: Diagnosis not present

## 2020-04-30 DIAGNOSIS — H353132 Nonexudative age-related macular degeneration, bilateral, intermediate dry stage: Secondary | ICD-10-CM | POA: Diagnosis not present

## 2020-04-30 DIAGNOSIS — H20013 Primary iridocyclitis, bilateral: Secondary | ICD-10-CM | POA: Diagnosis not present

## 2020-05-04 ENCOUNTER — Telehealth: Payer: Self-pay

## 2020-05-04 NOTE — Telephone Encounter (Signed)
Pt called stating she is very upset about the situation about her ibrance, per previous note and would like to discuss it with Dr Jana Hakim himself. Pt was offered a discussion with oral chemo pharmacy and pt states she has spoken to everyone she can speak to about it and just wants to tell Dr Jana Hakim about it herself. Pt is coming in tomorrow for lab work and hopes to just speak with him for 5 minutes.

## 2020-05-05 ENCOUNTER — Other Ambulatory Visit: Payer: Self-pay | Admitting: Oncology

## 2020-05-05 ENCOUNTER — Other Ambulatory Visit: Payer: Self-pay

## 2020-05-05 ENCOUNTER — Inpatient Hospital Stay: Payer: Medicare Other | Attending: Oncology

## 2020-05-05 ENCOUNTER — Telehealth: Payer: Self-pay

## 2020-05-05 DIAGNOSIS — Z170 Estrogen receptor positive status [ER+]: Secondary | ICD-10-CM

## 2020-05-05 DIAGNOSIS — C50411 Malignant neoplasm of upper-outer quadrant of right female breast: Secondary | ICD-10-CM

## 2020-05-05 LAB — CBC WITH DIFFERENTIAL (CANCER CENTER ONLY)
Abs Immature Granulocytes: 0.01 10*3/uL (ref 0.00–0.07)
Basophils Absolute: 0 10*3/uL (ref 0.0–0.1)
Basophils Relative: 1 %
Eosinophils Absolute: 0 10*3/uL (ref 0.0–0.5)
Eosinophils Relative: 1 %
HCT: 36.6 % (ref 36.0–46.0)
Hemoglobin: 12.2 g/dL (ref 12.0–15.0)
Immature Granulocytes: 0 %
Lymphocytes Relative: 30 %
Lymphs Abs: 0.7 10*3/uL (ref 0.7–4.0)
MCH: 32.3 pg (ref 26.0–34.0)
MCHC: 33.3 g/dL (ref 30.0–36.0)
MCV: 96.8 fL (ref 80.0–100.0)
Monocytes Absolute: 0.3 10*3/uL (ref 0.1–1.0)
Monocytes Relative: 14 %
Neutro Abs: 1.2 10*3/uL — ABNORMAL LOW (ref 1.7–7.7)
Neutrophils Relative %: 54 %
Platelet Count: 145 10*3/uL — ABNORMAL LOW (ref 150–400)
RBC: 3.78 MIL/uL — ABNORMAL LOW (ref 3.87–5.11)
RDW: 14.7 % (ref 11.5–15.5)
WBC Count: 2.3 10*3/uL — ABNORMAL LOW (ref 4.0–10.5)
nRBC: 0 % (ref 0.0–0.2)

## 2020-05-05 MED FILL — IBRANCE 75 MG TABS: 75 | 28 days supply | Qty: 21 | Fill #2

## 2020-05-05 NOTE — Telephone Encounter (Signed)
Attempted to reach patient to provide update and offer assistance with [place] and insurance  No answer. Left voicemail for patient to call back to discuss.  [place] Patient [place] Phone [phone] Fax [phone] 05/05/2020 3:18 PM

## 2020-05-05 NOTE — Progress Notes (Signed)
Ms. Noren stopped by today at the let me know that she was very dissatisfied with the service she received from Benjamine Mola [facility]) and a oral pharmacy support group.  She says she called and spoke with her a couple of times and was not helped and was hung up on.  She later received a call from what she originally thought was a [place] call but turned out to be related to her medication and she herself was able to get the Ibrance taken care of.  I called Benjamine Mola and asked her to either please call the patient and try to figure out for things wrong or ask her supervisor to call the patient and figure it out.  I also asked her to send me a note afterwards so we can close this off.

## 2020-05-06 NOTE — Telephone Encounter (Signed)
Patient returned phone call today. I apologized that she was dissatisfied with my service.  She was wanting me to recommend a medicare D plan for her to sign up with for 2022 and assist in signing her up and I am not credentialed or licensed to do that. She stated she contacted her insurance agent who assisted her in signing up for a plan for 2022. Patient states everything is taken care of and she no longer needs my assistance.  [place] Patient [place] Phone [phone] Fax [phone] 05/06/2020 8:47 AM

## 2020-05-18 DIAGNOSIS — E119 Type 2 diabetes mellitus without complications: Secondary | ICD-10-CM | POA: Diagnosis not present

## 2020-05-18 DIAGNOSIS — H401134 Primary open-angle glaucoma, bilateral, indeterminate stage: Secondary | ICD-10-CM | POA: Diagnosis not present

## 2020-05-18 DIAGNOSIS — Z961 Presence of intraocular lens: Secondary | ICD-10-CM | POA: Diagnosis not present

## 2020-06-02 ENCOUNTER — Other Ambulatory Visit: Payer: Self-pay

## 2020-06-02 ENCOUNTER — Inpatient Hospital Stay: Payer: Medicare Other | Attending: Oncology

## 2020-06-02 DIAGNOSIS — C50411 Malignant neoplasm of upper-outer quadrant of right female breast: Secondary | ICD-10-CM | POA: Diagnosis not present

## 2020-06-02 DIAGNOSIS — C7802 Secondary malignant neoplasm of left lung: Secondary | ICD-10-CM | POA: Diagnosis not present

## 2020-06-02 DIAGNOSIS — Z170 Estrogen receptor positive status [ER+]: Secondary | ICD-10-CM | POA: Diagnosis not present

## 2020-06-02 DIAGNOSIS — Z79811 Long term (current) use of aromatase inhibitors: Secondary | ICD-10-CM | POA: Insufficient documentation

## 2020-06-02 LAB — CBC WITH DIFFERENTIAL/PLATELET
Abs Immature Granulocytes: 0.01 10*3/uL (ref 0.00–0.07)
Basophils Absolute: 0 10*3/uL (ref 0.0–0.1)
Basophils Relative: 1 %
Eosinophils Absolute: 0 10*3/uL (ref 0.0–0.5)
Eosinophils Relative: 1 %
HCT: 36.3 % (ref 36.0–46.0)
Hemoglobin: 12.1 g/dL (ref 12.0–15.0)
Immature Granulocytes: 1 %
Lymphocytes Relative: 31 %
Lymphs Abs: 0.7 10*3/uL (ref 0.7–4.0)
MCH: 32.3 pg (ref 26.0–34.0)
MCHC: 33.3 g/dL (ref 30.0–36.0)
MCV: 96.8 fL (ref 80.0–100.0)
Monocytes Absolute: 0.3 10*3/uL (ref 0.1–1.0)
Monocytes Relative: 13 %
Neutro Abs: 1.2 10*3/uL — ABNORMAL LOW (ref 1.7–7.7)
Neutrophils Relative %: 53 %
Platelets: 133 10*3/uL — ABNORMAL LOW (ref 150–400)
RBC: 3.75 MIL/uL — ABNORMAL LOW (ref 3.87–5.11)
RDW: 14.8 % (ref 11.5–15.5)
WBC: 2.2 10*3/uL — ABNORMAL LOW (ref 4.0–10.5)
nRBC: 0 % (ref 0.0–0.2)

## 2020-06-02 MED FILL — IBRANCE 75 MG TABS: 75 | 28 days supply | Qty: 21 | Fill #3

## 2020-06-22 ENCOUNTER — Telehealth: Payer: Self-pay

## 2020-06-22 NOTE — Telephone Encounter (Signed)
Oral Oncology Patient Advocate Encounter   Was successful in securing patient a $4000 grant from [employer] to provide copayment coverage for Ibrance.  This will keep the out of pocket expense at $0.        The billing information is as follows and has been shared with [place].   Member ID: [id number] Group ID: [id number] RxBin: [id number] PCN: [id number] Dates of Eligibility: 06/17/20 through 06/17/21  Fund name:  Breast  [name] Patient [place] Phone [phone] Fax [phone] 06/22/2020 3:28 PM

## 2020-06-25 MED FILL — IBRANCE 75 MG TABS: 75 | 28 days supply | Qty: 21 | Fill #4

## 2020-06-29 NOTE — Progress Notes (Signed)
[name]  [phone] [phone]     ID: [name] DOB: [birth year]  MR#: [id number]  KDX#:[id number]  Patient Care Team: Biagio Borg, MD as PCP - General (Internal Medicine) Alphonsa Overall, MD as Consulting Physician (General Surgery) Regana Kemple, Virgie Dad, MD as Consulting Physician (Oncology) Kyung Rudd, MD as Consulting Physician (Radiation Oncology) Bobbye Charleston, MD as Consulting Physician (Obstetrics and Gynecology) Nada Libman, MD as Referring Physician (Specialist) Collene Gobble, MD as Consulting Physician (Pulmonary Disease) Alexis Frock, MD as Consulting Physician (Urology) Ander Slade, Carlisle Beers, MD as Referring Physician (Ophthalmology) OTHER MD:   CHIEF COMPLAINT: Estrogen receptor positive breast cancer  CURRENT TREATMENT: Letrozole, palbociclib   INTERVAL HISTORY: [name] returns today for follow-up of her estrogen receptor positive stage IV breast cancer.  She is accompanied by [name]  She continues on letrozole.  Aside from hair thinning she reports no symptoms or complications related to this medication  She does very well with the palbociclib also.  She is currently on 75 mg daily, 21 days on 7 days off.  She says it does make her feel tired and frequently after taking her [name] she will take a nap.  We discussed her taking it at night.   We are continuing to follow her tumor marker: Lab Results  Component Value Date   CA2729 40.4 (H) 04/07/2020   CA2729 27.0 03/10/2020   CA2729 39.1 (H) 12/17/2019   CA2729 37.6 11/19/2019   CA2729 31.8 09/24/2019    REVIEW OF SYSTEMS: [name] is not very active in terms of physical activity.  [name] does the cooking, housework, Medical sales representative, and yard work.  She usually goes to bed at 10 or 11 at night, gets up about 3 AM, gets back to bed around 5, stays there until 8, has breakfast, gets back to bed and finally gets out of bed at 10 in the morning.  She  complains of right arm really right shoulder pain and says this has been present since her radiation treatments.  She has occasional discomfort in the right axilla.  She has a little of sniffly cough in the morning.  None of the symptoms are new.  She tells me she had a wonderful Christmas with her family.  Detailed review of systems was very stable.   COVID 19 VACCINATION STATUS: Status post Pfizer x2 followed by booster August 2021   BREAST CANCER HISTORY: From the original intake note:  [name] had screening mammography showing some suspicious calcifications in the right breast leading to right diagnostic mammography with ultrasonography 01/22/2016 at [place]. The breast density was category C. In the upper right breast there was a 2.3 cm mass with additional masses measuring 0.9 and 0.7 cm. There was also a possible additional 0.8 mass in the lower inner quadrant. Ultrasound confirmed an irregular hypoechoic mass in the right breast upper outer quadrant measuring 2.0 cm. There were other masses measuring 0.7 and 0.8 cm by ultrasonography. The right axilla was sonographically benign.  Biopsy of a 12:00 and 4:00 mass in the right breast 01/22/2016 showed ([id number]) both specimens showing invasive ductal carcinoma, grade 1 or 2, both 95% estrogen receptor positive, both 95% progesterone receptor positive, both with strong staining intensity, with MIB-1 ranging from 10-15%, and both HER-2 negative, the signals ratio being 1.23-1.42, and the number per cell 1.85-2.59.  Her subsequent history is as detailed below   PAST MEDICAL HISTORY: Past Medical History:  Diagnosis Date  .  Breast cancer ([place])   . Cancer ([place]) 02/2016   right breast  . DIABETES MELLITUS, TYPE II 01/04/2007   only takes actoplus daily  . Dizziness and giddiness 02/29/2008  . DVT, HX OF    at age 30 in right buttocks  . Dyspnea    due to lung cancer  . Family history of breast cancer   . GERD 01/04/2007   pt reports  resolved   . GLAUCOMA 07/30/2008   both eyes  . History of blood transfusion    no abnormal  reaction  . History of uterine cancer 2000   hysterectomy done  . HYPERLIPIDEMIA 01/04/2007   taking Pravastatin daily  . HYPERTENSION 01/04/2007   takes Lisinopril daily  . Joint pain   . Joint swelling   . Leg cramps   . LEG PAIN, LEFT 07/06/2007  . NUMBNESS 07/30/2008   in fingers;pt states from Diamox  . OSTEOARTHRITIS, HIP 09/25/2009  . OTITIS MEDIA, ACUTE, BILATERAL 02/29/2008  . Overweight(278.02) 01/04/2007  . Peripheral vascular disease ([place])   . Personal history of radiation therapy 2018  . Pneumonia   . PONV (postoperative nausea and vomiting)   . SLEEP APNEA, OBSTRUCTIVE    doesn't use a cpap;study done about 58yr ago  . TRANSIENT ISCHEMIC ATTACK, HX OF 01/04/2007  . Vision loss    left eye    PAST SURGICAL HISTORY: Past Surgical History:  Procedure Laterality Date  . ABDOMINAL HYSTERECTOMY  2000  . BREAST LUMPECTOMY Right 01/13/2017   x2  . BREAST LUMPECTOMY WITH RADIOACTIVE SEED AND SENTINEL LYMPH NODE BIOPSY Right 01/13/2017   Procedure: RIGHT BREAST RADIOACTIVE SEED X'S 2 GUIDED LUMPECTOMY WITH RADIOACTIVE SEED TARGETED AXILLARYLYMPH NODE EXCISION AND RIGHT AXILLARY SENTINEL LYMPH NODE BIOPSY;  Surgeon: NAlphonsa Overall MD;  Location: [facility]  Service: General;  Laterality: Right;  2 SEEDS IN RIGHT BREAST 1 SEED IN RIGHT AXILLARY NODE  . CHOLECYSTECTOMY    . ENDOBRONCHIAL ULTRASOUND Bilateral 03/28/2016   Procedure: ENDOBRONCHIAL ULTRASOUND;  Surgeon: RCollene Gobble MD;  Location: [facility];  Service: Cardiopulmonary;  Laterality: Bilateral;  . EYE SURGERY  13   shunt left and lazer eye surgery on right cataract and retenia tear with repair  . growth removal  2004   from thumb  . KNEE ARTHROSCOPY Right   . mulitple eye surgeries     both eyes, cataracts with ioc done both eyes  . OOPHORECTOMY    . right lumpectomy with axillary node dissection Right 01/2017  . TOTAL  HIP ARTHROPLASTY  06/24/2011   Procedure: TOTAL HIP ARTHROPLASTY;  Surgeon: FKerin Salen  Location: [facility]  Service: Orthopedics;  Laterality: Right;  . TOTAL HIP ARTHROPLASTY Left 11/12/2012   Dr RMayer Camel . TOTAL HIP ARTHROPLASTY Left 11/12/2012   Procedure: TOTAL HIP ARTHROPLASTY;  Surgeon: FKerin Salen MD;  [facility]  Service: Orthopedics;  Laterality: Left;  DEPUY PINNACLE    FAMILY HISTORY Family History  Problem Relation Age of Onset  . Dementia Mother   . Cancer Mother        Breast and lung cancer  . Stroke Sister   . Breast cancer Sister 538 . Heart attack Maternal Aunt   . Lung cancer Maternal Grandmother        non smoker  . Glaucoma Maternal Grandfather   . Anesthesia problems Neg Hx   The patient's father died at age 75 the patient's mother died at age 75 She had breast and lung  cancers diagnosed shortly before her death. The patient had no brothers, 2 sisters. One sister was diagnosed with breast cancer at the age of 25.   GYNECOLOGIC HISTORY:  No LMP recorded. Patient has had a hysterectomy. Menarche age 52, first live birth age 76, the patient is GX P1. She had a hysterectomy for endometrial cancer in the year 2000. She did not take hormone replacement. She did use oral contraceptives for more than 20 years remotely, with no complications.   SOCIAL HISTORY: (Updated July 2021). 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 She is home with [name]. He is a retired Dealer.Their son [name] also lives in [place].  The patient has 3 grandchildren aged 92, 47 and 50    ADVANCED DIRECTIVES: In place   HEALTH MAINTENANCE: Social History   Tobacco Use  . Smoking status: Never Smoker  . Smokeless tobacco: Never Used  Vaping Use  . Vaping Use: Never used  Substance Use Topics  . Alcohol use: No  . Drug use: No     Colonoscopy: Never  PAP: Status post hysterectomy  Bone  density: Remote   Allergies  Allergen Reactions  . Codeine Hives    Hycodan syrup  . Fluorescein Nausea And Vomiting    ? IV dye for retina specialist  . Lipitor [Atorvastatin Calcium]     Leg cramp  . Oxycodone Nausea And Vomiting    Patient vomited for 3 days after taking  . Sitagliptin Phosphate Nausea And Vomiting  . Sulfa Drugs Cross Reactors Nausea And Vomiting    Current Outpatient Medications  Medication Sig Dispense Refill  . acetaminophen (TYLENOL) 500 MG tablet Take 1,000 mg by mouth every 4 (four) hours as needed for moderate pain or fever.    Marland Kitchen aspirin EC 81 MG tablet Take 81 mg by mouth daily at 6 PM. 1700    . Biotin 1 MG CAPS Take by mouth.    . brimonidine (ALPHAGAN) 0.2 % ophthalmic solution 1 drop 3 (three) times daily.    . brimonidine-timolol (COMBIGAN) 0.2-0.5 % ophthalmic solution Place 1 drop into both eyes three times daily    . cholecalciferol (VITAMIN D) 1000 units tablet Take 1,000 Units by mouth daily.    . dorzolamide-timolol (COSOPT) 22.3-6.8 MG/ML ophthalmic solution Place 1 drop into both eyes 2 (two) times daily.    Marland Kitchen ketoconazole (NIZORAL) 2 % cream Apply 1 application topically daily. 15 g 0  . ketorolac (ACULAR) 0.5 % ophthalmic solution Place 1 drop into the right eye 2 (two) times daily.    Marland Kitchen letrozole (FEMARA) 2.5 MG tablet Take 1 tablet (2.5 mg total) by mouth at bedtime. 90 tablet 4  . lisinopril (ZESTRIL) 5 MG tablet Take 1 tablet (5 mg total) by mouth daily. 90 tablet 3  . lovastatin (MEVACOR) 20 MG tablet Take 0.5 tablets (10 mg total) by mouth at bedtime. 90 tablet 3  . palbociclib (IBRANCE) 75 MG tablet TAKE 1 TABLET (75 MG TOTAL) BY MOUTH DAILY. TAKE FOR 21 DAYS ON, 7 DAYS OFF, REPEAT EVERY 28 DAYS. 21 tablet 6  . pioglitazone (ACTOS) 15 MG tablet Take 1 tablet (15 mg total) by mouth daily. 90 tablet 3  . prednisoLONE acetate (PRED FORTE) 1 % ophthalmic suspension SMARTSIG:1 In Eye(s) 6 Times Daily     No current facility-administered  medications for this visit.     OBJECTIVE: white woman who appears stated age  Vitals:   06/30/20 1054  BP: (!) 178/60  Pulse: 88  Resp: 16  Temp: (!) 97.5 F (36.4 C)  SpO2: 99%   Wt Readings from Last 3 Encounters:  06/30/20 241 lb 14.4 oz (109.7 kg)  04/07/20 238 lb 14.4 oz (108.4 kg)  03/17/20 238 lb (108 kg)   Body mass index is 41.52 kg/m.    ECOG FS:1 - Symptomatic but completely ambulatory  Sclerae unicteric, EOMs intact Wearing a mask No cervical or supraclavicular adenopathy Lungs no rales or rhonchi Heart regular rate and rhythm Abd soft, nontender, positive bowel sounds MSK no focal spinal tenderness, no upper extremity lymphedema Neuro: nonfocal, well oriented, appropriate affect Breasts: The right breast is status post lumpectomy and radiation.  There is some distortion of the contour because of the treatment and some tenderness to palpation but there is no evidence of disease recurrence.  The left breast is benign.  Both axillae are benign.   LAB RESULTS:  CMP     Component Value Date/Time   NA 139 04/07/2020 1119   NA 140 05/29/2017 1254   K 4.3 04/07/2020 1119   K 4.4 05/29/2017 1254   CL 107 04/07/2020 1119   CO2 26 04/07/2020 1119   CO2 25 05/29/2017 1254   GLUCOSE 204 (H) 04/07/2020 1119   GLUCOSE 154 (H) 05/29/2017 1254   BUN 13 04/07/2020 1119   BUN 11.5 05/29/2017 1254   CREATININE 0.81 04/07/2020 1119   CREATININE 0.83 03/10/2020 1138   CREATININE 0.8 05/29/2017 1254   CALCIUM 9.4 04/07/2020 1119   CALCIUM 9.3 05/29/2017 1254   PROT 6.8 04/07/2020 1119   PROT 7.0 05/29/2017 1254   ALBUMIN 4.0 04/07/2020 1119   ALBUMIN 4.1 05/29/2017 1254   AST 13 (L) 04/07/2020 1119   AST 12 (L) 03/10/2020 1138   AST 13 05/29/2017 1254   ALT 15 04/07/2020 1119   ALT 14 03/10/2020 1138   ALT 14 05/29/2017 1254   ALKPHOS 64 04/07/2020 1119   ALKPHOS 66 05/29/2017 1254   BILITOT 0.5 04/07/2020 1119   BILITOT 0.4 03/10/2020 1138   BILITOT 0.50  05/29/2017 1254   GFRNONAA >60 04/07/2020 1119   GFRNONAA >60 03/10/2020 1138   GFRAA >60 03/10/2020 1138    INo results found for: SPEP, UPEP  Lab Results  Component Value Date   WBC 2.0 (L) 06/30/2020   NEUTROABS 1.0 (L) 06/30/2020   HGB 11.9 (L) 06/30/2020   HCT 35.5 (L) 06/30/2020   MCV 96.2 06/30/2020   PLT 137 (L) 06/30/2020      Chemistry      Component Value Date/Time   NA 139 04/07/2020 1119   NA 140 05/29/2017 1254   K 4.3 04/07/2020 1119   K 4.4 05/29/2017 1254   CL 107 04/07/2020 1119   CO2 26 04/07/2020 1119   CO2 25 05/29/2017 1254   BUN 13 04/07/2020 1119   BUN 11.5 05/29/2017 1254   CREATININE 0.81 04/07/2020 1119   CREATININE 0.83 03/10/2020 1138   CREATININE 0.8 05/29/2017 1254      Component Value Date/Time   CALCIUM 9.4 04/07/2020 1119   CALCIUM 9.3 05/29/2017 1254   ALKPHOS 64 04/07/2020 1119   ALKPHOS 66 05/29/2017 1254   AST 13 (L) 04/07/2020 1119   AST 12 (L) 03/10/2020 1138   AST 13 05/29/2017 1254   ALT 15 04/07/2020 1119   ALT 14 03/10/2020 1138   ALT 14 05/29/2017 1254   BILITOT 0.5 04/07/2020 1119  BILITOT 0.4 03/10/2020 1138   BILITOT 0.50 05/29/2017 1254       No results found for: LABCA2  No components found for: GHWEX937  No results for input(s): INR in the last 168 hours.  Urinalysis    Component Value Date/Time   COLORURINE YELLOW 08/18/2017 1322   APPEARANCEUR HAZY (A) 08/18/2017 1322   LABSPEC 1.010 08/18/2017 1322   PHURINE 7.0 08/18/2017 1322   GLUCOSEU NEGATIVE 08/18/2017 1322   GLUCOSEU NEGATIVE 10/30/2014 1513   HGBUR LARGE (A) 08/18/2017 1322   BILIRUBINUR NEGATIVE 08/18/2017 1322   KETONESUR NEGATIVE 08/18/2017 1322   PROTEINUR 30 (A) 08/18/2017 1322   UROBILINOGEN 0.2 10/30/2014 1513   NITRITE NEGATIVE 08/18/2017 1322   LEUKOCYTESUR LARGE (A) 08/18/2017 1322    STUDIES: No results found.    ELIGIBLE FOR AVAILABLE RESEARCH PROTOCOL: no  ASSESSMENT: 75 y.o. [place] woman with a remote  history of early stage endometrial cancer, subsequently status post right breast upper outer quadrant biopsy 01/22/2016 for a clinically multifocal T2 N0, stage 2A invasive ductal carcinoma, grade 1, estrogen and progesterone receptor positive, HER-2 negative, with an MIB-1 between 10 and 15%.  (1) right axillary lymph node biopsy 03/02/2016 positive  (2) genetics testing 01/13/2016 through the Custom gene panel offered by [facility] found no deleterious mutations in  ATM, BARD1, BRCA1, BRCA2, BRIP1, CDH1, CHEK2, EPCAM, FANCC, MLH1, MSH2, MSH6, MUTYH, NBN, PALB2, PMS2, POLD1, PTEN, RAD51C, RAD51D, TP53, and XRCC2  METASTATIC DISEASE: OCT 2017 (3) CT scans of the chest abdomen and pelvis obtained 03/10/2016 are consistent with bilateral lung metastases and mediastinal and hilar nodal involvement, but no liver or bone spread  (a) bronchoscopic lymph node biopsy 2 (station 7, 13R) 03/28/2016 confirms metastatic adenocarcinoma, estrogen receptor positive, HER-2 not amplified  (b) baseline CA-27-29 on 04/18/2016 was 137.5.  (4) letrozole started 03/15/2016, palbociclib added 03/29/2016 at 125 mg/day, 21/7  (a) dose decreased to 100 mg per day, 21/7, beginning with February cycle  (b) palbociclib held 01/31/2017, with increasing symptoms  (c) palbociclib resumed October 2018 at 75 mg daily  (d) palbociclib dose reduced to 75 mg every other day February through April 2019  (e) palbociclib dose resumed at 75 mg daily as of 10/17/2017  (f) palbociclib dose decreased to 75 mg every other day beginning 07/31/2019  (g) palbociclib resumed at 75 mg daily, 21 days on 7 off, as of 08/25/2019  (5) status post double right lumpectomies and right axillary lymph node sampling 01/13/2017 for 2 separate invasive ductal carcinoma lesions, pT1a and pT1b, N1a, with negative margins, both lesions being estrogen and progesterone receptor positive and HER-2 negative  (6) adjuvant radiation completed 07/05/2017 1. 50.4 Gy  in 28 fractions to the right breast and supraclavicular region using whole-breast tangent fields. 2. Boost to the seroma delivered an additional 10 Gy in 5 fractions. The total dose was 60.4 Gy.  (7) restaging studies:  (a) CT scan of the chest and bone scan 06/23/2017 showed stable scattered very small lung nodules, no bone lesions  (b) CT of the chest 10/17/2017 showed no new or progressive metastatic disease in the chest. The small left lower lobe pulmonary nodule is stable  (c) PET scan on 03/02/2018: shows no findings for residual or recurrent right breast cancer  (d) chest CT scan stable, questionable right renal cyst noted   (8) right upper pole renal lesion noted to be enlarging on CT scan 06/22/2017  (a) no uptake on PET scan obtained 03/02/2018   PLAN: [name]  is a little over 4 years out from definitive diagnosis of metastatic breast cancer.  Her disease is clinically well controlled.  She is tolerating her treatment well.  Accordingly we are making no changes in her treatment.  Her ANC is right at the borderline for we might need to drop her Ibrance dose further.  Accordingly we are continuing to check it every 4 weeks.  We are checking her tumor marker every 4weeks.  She will have a restaging CT of the chest in April and see me shortly thereafter.  She did have a complex kidney cyst and this will allow [place] to get that 1 more look at that.  She tells me she had a bad experience with one of our oral chemotherapy pharmacy specialists.  We had dealt with this previously.  That person called her and apologized.  Nevertheless [name] is still reluctant to deal with our specialist here and instead is dealing with the [facility] folks whom she finds very congenial.  She was able to get her grant for [facility] reauthorized for the whole of this year.  Otherwise says she will return to see me in 3 months.  She knows to call for any other issue that may develop before that  visit  Total encounter time 25 minutes.*   Maythe Deramo, Virgie Dad, MD  06/30/20 11:26 AM Medical Oncology and Hematology [facility] [place], [place] [address] Tel. [phone]    Fax. [phone]   I, Wilburn Mylar, am acting as scribe for Dr. Virgie Dad. Kemon Devincenzi.  I, Lurline Del MD, have reviewed the above documentation for accuracy and completeness, and I agree with the above.   *Total Encounter Time as defined by the Centers for Medicare and Medicaid Services includes, in addition to the face-to-face time of a patient visit (documented in the note above) non-face-to-face time: obtaining and reviewing outside history, ordering and reviewing medications, tests or procedures, care coordination (communications with other health care professionals or caregivers) and documentation in the medical record.

## 2020-06-30 ENCOUNTER — Inpatient Hospital Stay: Payer: Medicare Other | Attending: Oncology

## 2020-06-30 ENCOUNTER — Inpatient Hospital Stay (HOSPITAL_BASED_OUTPATIENT_CLINIC_OR_DEPARTMENT_OTHER): Payer: Medicare Other | Admitting: Oncology

## 2020-06-30 ENCOUNTER — Other Ambulatory Visit: Payer: Self-pay

## 2020-06-30 ENCOUNTER — Other Ambulatory Visit: Payer: Self-pay | Admitting: Oncology

## 2020-06-30 VITALS — BP 178/60 | HR 88 | Temp 97.5°F | Resp 16 | Ht 64.0 in | Wt 241.9 lb

## 2020-06-30 DIAGNOSIS — Z79899 Other long term (current) drug therapy: Secondary | ICD-10-CM | POA: Diagnosis not present

## 2020-06-30 DIAGNOSIS — Z79811 Long term (current) use of aromatase inhibitors: Secondary | ICD-10-CM | POA: Insufficient documentation

## 2020-06-30 DIAGNOSIS — Z8679 Personal history of other diseases of the circulatory system: Secondary | ICD-10-CM

## 2020-06-30 DIAGNOSIS — Z170 Estrogen receptor positive status [ER+]: Secondary | ICD-10-CM | POA: Diagnosis not present

## 2020-06-30 DIAGNOSIS — G620 Drug-induced polyneuropathy: Secondary | ICD-10-CM

## 2020-06-30 DIAGNOSIS — C50411 Malignant neoplasm of upper-outer quadrant of right female breast: Secondary | ICD-10-CM

## 2020-06-30 DIAGNOSIS — Z8249 Family history of ischemic heart disease and other diseases of the circulatory system: Secondary | ICD-10-CM | POA: Diagnosis not present

## 2020-06-30 DIAGNOSIS — C7800 Secondary malignant neoplasm of unspecified lung: Secondary | ICD-10-CM

## 2020-06-30 DIAGNOSIS — E119 Type 2 diabetes mellitus without complications: Secondary | ICD-10-CM

## 2020-06-30 DIAGNOSIS — T451X5A Adverse effect of antineoplastic and immunosuppressive drugs, initial encounter: Secondary | ICD-10-CM | POA: Diagnosis not present

## 2020-06-30 DIAGNOSIS — Z7984 Long term (current) use of oral hypoglycemic drugs: Secondary | ICD-10-CM | POA: Diagnosis not present

## 2020-06-30 DIAGNOSIS — Z923 Personal history of irradiation: Secondary | ICD-10-CM | POA: Insufficient documentation

## 2020-06-30 DIAGNOSIS — K760 Fatty (change of) liver, not elsewhere classified: Secondary | ICD-10-CM

## 2020-06-30 DIAGNOSIS — Z801 Family history of malignant neoplasm of trachea, bronchus and lung: Secondary | ICD-10-CM | POA: Diagnosis not present

## 2020-06-30 DIAGNOSIS — Z7189 Other specified counseling: Secondary | ICD-10-CM | POA: Diagnosis not present

## 2020-06-30 DIAGNOSIS — R590 Localized enlarged lymph nodes: Secondary | ICD-10-CM | POA: Diagnosis not present

## 2020-06-30 DIAGNOSIS — Z7982 Long term (current) use of aspirin: Secondary | ICD-10-CM | POA: Diagnosis not present

## 2020-06-30 DIAGNOSIS — G4733 Obstructive sleep apnea (adult) (pediatric): Secondary | ICD-10-CM | POA: Diagnosis not present

## 2020-06-30 DIAGNOSIS — Z86718 Personal history of other venous thrombosis and embolism: Secondary | ICD-10-CM | POA: Insufficient documentation

## 2020-06-30 DIAGNOSIS — Z8349 Family history of other endocrine, nutritional and metabolic diseases: Secondary | ICD-10-CM | POA: Insufficient documentation

## 2020-06-30 DIAGNOSIS — Z803 Family history of malignant neoplasm of breast: Secondary | ICD-10-CM | POA: Insufficient documentation

## 2020-06-30 LAB — CMP (CANCER CENTER ONLY)
ALT: 14 U/L (ref 0–44)
AST: 17 U/L (ref 15–41)
Albumin: 3.9 g/dL (ref 3.5–5.0)
Alkaline Phosphatase: 66 U/L (ref 38–126)
Anion gap: 7 (ref 5–15)
BUN: 14 mg/dL (ref 8–23)
CO2: 28 mmol/L (ref 22–32)
Calcium: 9 mg/dL (ref 8.9–10.3)
Chloride: 105 mmol/L (ref 98–111)
Creatinine: 0.77 mg/dL (ref 0.44–1.00)
GFR, Estimated: >60 mL/min (ref >60)
Glucose, Bld: 239 mg/dL — ABNORMAL HIGH (ref 70–99)
Potassium: 4.1 mmol/L (ref 3.5–5.1)
Sodium: 140 mmol/L (ref 135–145)
Total Bilirubin: 0.5 mg/dL (ref 0.3–1.2)
Total Protein: 6.7 g/dL (ref 6.5–8.1)

## 2020-06-30 LAB — CBC WITH DIFFERENTIAL/PLATELET
Abs Immature Granulocytes: 0.01 10*3/uL (ref 0.00–0.07)
Basophils Absolute: 0 10*3/uL (ref 0.0–0.1)
Basophils Relative: 2 %
Eosinophils Absolute: 0 10*3/uL (ref 0.0–0.5)
Eosinophils Relative: 1 %
HCT: 35.5 % — ABNORMAL LOW (ref 36.0–46.0)
Hemoglobin: 11.9 g/dL — ABNORMAL LOW (ref 12.0–15.0)
Immature Granulocytes: 1 %
Lymphocytes Relative: 33 %
Lymphs Abs: 0.7 10*3/uL (ref 0.7–4.0)
MCH: 32.2 pg (ref 26.0–34.0)
MCHC: 33.5 g/dL (ref 30.0–36.0)
MCV: 96.2 fL (ref 80.0–100.0)
Monocytes Absolute: 0.3 10*3/uL (ref 0.1–1.0)
Monocytes Relative: 13 %
Neutro Abs: 1 10*3/uL — ABNORMAL LOW (ref 1.7–7.7)
Neutrophils Relative %: 50 %
Platelets: 137 10*3/uL — ABNORMAL LOW (ref 150–400)
RBC: 3.69 MIL/uL — ABNORMAL LOW (ref 3.87–5.11)
RDW: 14.7 % (ref 11.5–15.5)
WBC: 2 10*3/uL — ABNORMAL LOW (ref 4.0–10.5)
nRBC: 0 % (ref 0.0–0.2)

## 2020-06-30 MED ORDER — PALBOCICLIB 75 MG PO TABS: ORAL_TABLET | ORAL | 6 refills | Status: DC

## 2020-06-30 MED ORDER — LETROZOLE 2.5 MG PO TABS: 2.5000 mg | ORAL_TABLET | Freq: Every day | ORAL | 4 refills | Status: DC

## 2020-06-30 NOTE — Addendum Note (Signed)
Addended by: Chauncey Cruel on: 06/30/2020 11:33 AM   Modules accepted: Orders

## 2020-07-01 LAB — CANCER ANTIGEN 27.29: CA 27.29: 32.1 U/mL (ref 0.0–38.6)

## 2020-07-04 MED FILL — LETROZOLE 2.5 MG TABLET: 2.5 | 90 days supply | Qty: 90 | Fill #0

## 2020-07-22 ENCOUNTER — Telehealth: Payer: Self-pay

## 2020-07-22 ENCOUNTER — Telehealth: Payer: Self-pay | Admitting: Internal Medicine

## 2020-07-22 MED ORDER — LOVASTATIN 20 MG PO TABS
20.0000 mg | ORAL_TABLET | Freq: Every day | ORAL | 3 refills | Status: DC
Start: 2020-07-22 — End: 2020-12-29

## 2020-07-22 NOTE — Telephone Encounter (Signed)
Left pt vc to call office back to notify us of the specific questions she had.

## 2020-07-22 NOTE — Telephone Encounter (Signed)
Patient called and said she had a few questions about lovastatin (MEVACOR) 20 MG tablet. She would not specify the questions. She can be reached at [phone].

## 2020-07-22 NOTE — Telephone Encounter (Signed)
Ok to ask pt to take the whole pill  I think the rx with the Half pill was an error, as the half pill is so small it likely would not be effective if at all  [facility] to please take whole pill as long as she tolerated this, and I sent a new prescription

## 2020-07-23 NOTE — Telephone Encounter (Signed)
Pt notified    Ok to ask pt to take the whole pill  I think the rx with the Half pill was an error, as the half pill is so small it likely would not be effective if at all  [facility] to please take whole pill as long as she tolerated this, and I sent a new prescription

## 2020-07-27 MED FILL — IBRANCE 75 MG TABS: 75 | 28 days supply | Qty: 21 | Fill #5

## 2020-07-28 ENCOUNTER — Inpatient Hospital Stay: Payer: Medicare Other | Attending: Oncology

## 2020-07-28 ENCOUNTER — Other Ambulatory Visit: Payer: Self-pay

## 2020-07-28 ENCOUNTER — Telehealth: Payer: Self-pay

## 2020-07-28 DIAGNOSIS — C7800 Secondary malignant neoplasm of unspecified lung: Secondary | ICD-10-CM | POA: Diagnosis not present

## 2020-07-28 DIAGNOSIS — Z79811 Long term (current) use of aromatase inhibitors: Secondary | ICD-10-CM | POA: Insufficient documentation

## 2020-07-28 DIAGNOSIS — C50411 Malignant neoplasm of upper-outer quadrant of right female breast: Secondary | ICD-10-CM | POA: Diagnosis not present

## 2020-07-28 DIAGNOSIS — Z170 Estrogen receptor positive status [ER+]: Secondary | ICD-10-CM | POA: Diagnosis not present

## 2020-07-28 LAB — CBC WITH DIFFERENTIAL/PLATELET
Abs Immature Granulocytes: 0 10*3/uL (ref 0.00–0.07)
Basophils Absolute: 0 10*3/uL (ref 0.0–0.1)
Basophils Relative: 1 %
Eosinophils Absolute: 0 10*3/uL (ref 0.0–0.5)
Eosinophils Relative: 1 %
HCT: 35.3 % — ABNORMAL LOW (ref 36.0–46.0)
Hemoglobin: 12 g/dL (ref 12.0–15.0)
Immature Granulocytes: 0 %
Lymphocytes Relative: 37 %
Lymphs Abs: 0.7 10*3/uL (ref 0.7–4.0)
MCH: 32.1 pg (ref 26.0–34.0)
MCHC: 34 g/dL (ref 30.0–36.0)
MCV: 94.4 fL (ref 80.0–100.0)
Monocytes Absolute: 0.3 10*3/uL (ref 0.1–1.0)
Monocytes Relative: 15 %
Neutro Abs: 0.9 10*3/uL — ABNORMAL LOW (ref 1.7–7.7)
Neutrophils Relative %: 46 %
Platelets: 112 10*3/uL — ABNORMAL LOW (ref 150–400)
RBC: 3.74 MIL/uL — ABNORMAL LOW (ref 3.87–5.11)
RDW: 15.1 % (ref 11.5–15.5)
WBC: 1.9 10*3/uL — ABNORMAL LOW (ref 4.0–10.5)
nRBC: 0 % (ref 0.0–0.2)

## 2020-07-28 NOTE — Telephone Encounter (Signed)
Oral Oncology Patient Advocate Encounter  Met patient in [place] to complete application for [employer] in an effort to reduce patient's out of pocket expense for Ibrance to $0.    Application completed and faxed to [phone].   [employer] patient assistance phone number for follow up is [phone].   This encounter will be updated until final determination.   [place] Patient [place] Phone [phone] Fax [phone] 07/28/2020 11:14 AM

## 2020-08-06 NOTE — Telephone Encounter (Signed)
Patient is approved for Ibrance at no cost from [employer] 08/06/20-06/12/21  [place] uses [place] Patient [place] Phone [phone] Fax [phone] 08/06/2020 11:06 AM

## 2020-08-10 ENCOUNTER — Other Ambulatory Visit: Payer: Self-pay | Admitting: Internal Medicine

## 2020-08-10 NOTE — Telephone Encounter (Signed)
Please refill as per office routine med refill policy (all routine meds refilled for 3 mo or monthly per pt preference up to one year from last visit, then month to month grace period for 3 mo, then further med refills will have to be denied)  

## 2020-08-25 ENCOUNTER — Inpatient Hospital Stay: Payer: Medicare Other | Attending: Oncology

## 2020-08-25 ENCOUNTER — Other Ambulatory Visit: Payer: Self-pay

## 2020-08-25 DIAGNOSIS — C50411 Malignant neoplasm of upper-outer quadrant of right female breast: Secondary | ICD-10-CM

## 2020-08-25 DIAGNOSIS — Z170 Estrogen receptor positive status [ER+]: Secondary | ICD-10-CM | POA: Insufficient documentation

## 2020-08-25 LAB — CBC WITH DIFFERENTIAL/PLATELET
Abs Immature Granulocytes: 0.01 10*3/uL (ref 0.00–0.07)
Basophils Absolute: 0 10*3/uL (ref 0.0–0.1)
Basophils Relative: 1 %
Eosinophils Absolute: 0 10*3/uL (ref 0.0–0.5)
Eosinophils Relative: 1 %
HCT: 35.9 % — ABNORMAL LOW (ref 36.0–46.0)
Hemoglobin: 12.3 g/dL (ref 12.0–15.0)
Immature Granulocytes: 0 %
Lymphocytes Relative: 33 %
Lymphs Abs: 0.8 10*3/uL (ref 0.7–4.0)
MCH: 32.7 pg (ref 26.0–34.0)
MCHC: 34.3 g/dL (ref 30.0–36.0)
MCV: 95.5 fL (ref 80.0–100.0)
Monocytes Absolute: 0.3 10*3/uL (ref 0.1–1.0)
Monocytes Relative: 14 %
Neutro Abs: 1.3 10*3/uL — ABNORMAL LOW (ref 1.7–7.7)
Neutrophils Relative %: 51 %
Platelets: 136 10*3/uL — ABNORMAL LOW (ref 150–400)
RBC: 3.76 MIL/uL — ABNORMAL LOW (ref 3.87–5.11)
RDW: 14.7 % (ref 11.5–15.5)
WBC: 2.5 10*3/uL — ABNORMAL LOW (ref 4.0–10.5)
nRBC: 0 % (ref 0.0–0.2)

## 2020-08-27 ENCOUNTER — Telehealth: Payer: Self-pay

## 2020-08-27 NOTE — Telephone Encounter (Signed)
Returned call to pt about what she can take for cold and cough. Pt wanted to take robitussin and was told it is not contraindicated with Ibrance. Pt verbalized understanding.

## 2020-09-02 ENCOUNTER — Other Ambulatory Visit: Payer: Self-pay | Admitting: Oncology

## 2020-09-02 DIAGNOSIS — Z9889 Other specified postprocedural states: Secondary | ICD-10-CM

## 2020-09-10 ENCOUNTER — Other Ambulatory Visit (HOSPITAL_COMMUNITY): Payer: Self-pay

## 2020-09-10 DIAGNOSIS — Z23 Encounter for immunization: Secondary | ICD-10-CM | POA: Diagnosis not present

## 2020-09-17 ENCOUNTER — Other Ambulatory Visit (HOSPITAL_COMMUNITY): Payer: Self-pay

## 2020-09-17 DIAGNOSIS — H2013 Chronic iridocyclitis, bilateral: Secondary | ICD-10-CM | POA: Diagnosis not present

## 2020-09-17 DIAGNOSIS — H35351 Cystoid macular degeneration, right eye: Secondary | ICD-10-CM | POA: Diagnosis not present

## 2020-09-18 ENCOUNTER — Inpatient Hospital Stay: Payer: Medicare Other | Attending: Oncology

## 2020-09-18 ENCOUNTER — Other Ambulatory Visit: Payer: Self-pay

## 2020-09-18 ENCOUNTER — Ambulatory Visit (HOSPITAL_COMMUNITY)
Admission: RE | Admit: 2020-09-18 | Discharge: 2020-09-18 | Disposition: A | Discharge status: Home / Self Care | Payer: Medicare Other | Source: Ambulatory Visit | Attending: Oncology | Admitting: Oncology

## 2020-09-18 DIAGNOSIS — Z170 Estrogen receptor positive status [ER+]: Secondary | ICD-10-CM | POA: Insufficient documentation

## 2020-09-18 DIAGNOSIS — Z853 Personal history of malignant neoplasm of breast: Secondary | ICD-10-CM | POA: Diagnosis not present

## 2020-09-18 DIAGNOSIS — Z8679 Personal history of other diseases of the circulatory system: Secondary | ICD-10-CM | POA: Diagnosis not present

## 2020-09-18 DIAGNOSIS — I10 Essential (primary) hypertension: Secondary | ICD-10-CM | POA: Insufficient documentation

## 2020-09-18 DIAGNOSIS — G4733 Obstructive sleep apnea (adult) (pediatric): Secondary | ICD-10-CM | POA: Insufficient documentation

## 2020-09-18 DIAGNOSIS — G620 Drug-induced polyneuropathy: Secondary | ICD-10-CM | POA: Diagnosis not present

## 2020-09-18 DIAGNOSIS — Z923 Personal history of irradiation: Secondary | ICD-10-CM | POA: Insufficient documentation

## 2020-09-18 DIAGNOSIS — R590 Localized enlarged lymph nodes: Secondary | ICD-10-CM | POA: Insufficient documentation

## 2020-09-18 DIAGNOSIS — Z7984 Long term (current) use of oral hypoglycemic drugs: Secondary | ICD-10-CM | POA: Insufficient documentation

## 2020-09-18 DIAGNOSIS — K219 Gastro-esophageal reflux disease without esophagitis: Secondary | ICD-10-CM | POA: Insufficient documentation

## 2020-09-18 DIAGNOSIS — I2510 Atherosclerotic heart disease of native coronary artery without angina pectoris: Secondary | ICD-10-CM | POA: Diagnosis not present

## 2020-09-18 DIAGNOSIS — Z79899 Other long term (current) drug therapy: Secondary | ICD-10-CM | POA: Insufficient documentation

## 2020-09-18 DIAGNOSIS — Z79811 Long term (current) use of aromatase inhibitors: Secondary | ICD-10-CM | POA: Insufficient documentation

## 2020-09-18 DIAGNOSIS — E119 Type 2 diabetes mellitus without complications: Secondary | ICD-10-CM | POA: Diagnosis not present

## 2020-09-18 DIAGNOSIS — Z8673 Personal history of transient ischemic attack (TIA), and cerebral infarction without residual deficits: Secondary | ICD-10-CM | POA: Insufficient documentation

## 2020-09-18 DIAGNOSIS — C50411 Malignant neoplasm of upper-outer quadrant of right female breast: Secondary | ICD-10-CM

## 2020-09-18 DIAGNOSIS — C7800 Secondary malignant neoplasm of unspecified lung: Secondary | ICD-10-CM

## 2020-09-18 DIAGNOSIS — T451X5A Adverse effect of antineoplastic and immunosuppressive drugs, initial encounter: Secondary | ICD-10-CM

## 2020-09-18 DIAGNOSIS — Z7982 Long term (current) use of aspirin: Secondary | ICD-10-CM | POA: Insufficient documentation

## 2020-09-18 DIAGNOSIS — Z86718 Personal history of other venous thrombosis and embolism: Secondary | ICD-10-CM | POA: Insufficient documentation

## 2020-09-18 DIAGNOSIS — L989 Disorder of the skin and subcutaneous tissue, unspecified: Secondary | ICD-10-CM | POA: Diagnosis not present

## 2020-09-18 DIAGNOSIS — K760 Fatty (change of) liver, not elsewhere classified: Secondary | ICD-10-CM | POA: Diagnosis not present

## 2020-09-18 DIAGNOSIS — Z8542 Personal history of malignant neoplasm of other parts of uterus: Secondary | ICD-10-CM | POA: Insufficient documentation

## 2020-09-18 DIAGNOSIS — Z803 Family history of malignant neoplasm of breast: Secondary | ICD-10-CM | POA: Insufficient documentation

## 2020-09-18 DIAGNOSIS — E785 Hyperlipidemia, unspecified: Secondary | ICD-10-CM | POA: Insufficient documentation

## 2020-09-18 DIAGNOSIS — R0602 Shortness of breath: Secondary | ICD-10-CM | POA: Diagnosis not present

## 2020-09-18 DIAGNOSIS — E1136 Type 2 diabetes mellitus with diabetic cataract: Secondary | ICD-10-CM | POA: Insufficient documentation

## 2020-09-18 DIAGNOSIS — Z7189 Other specified counseling: Secondary | ICD-10-CM

## 2020-09-18 DIAGNOSIS — E1151 Type 2 diabetes mellitus with diabetic peripheral angiopathy without gangrene: Secondary | ICD-10-CM | POA: Insufficient documentation

## 2020-09-18 DIAGNOSIS — I700 Atherosclerosis of aorta: Secondary | ICD-10-CM | POA: Insufficient documentation

## 2020-09-18 DIAGNOSIS — Z85118 Personal history of other malignant neoplasm of bronchus and lung: Secondary | ICD-10-CM | POA: Insufficient documentation

## 2020-09-18 LAB — CBC WITH DIFFERENTIAL/PLATELET
Abs Immature Granulocytes: 0.01 10*3/uL (ref 0.00–0.07)
Basophils Absolute: 0 10*3/uL (ref 0.0–0.1)
Basophils Relative: 1 %
Eosinophils Absolute: 0 10*3/uL (ref 0.0–0.5)
Eosinophils Relative: 2 %
HCT: 35.2 % — ABNORMAL LOW (ref 36.0–46.0)
Hemoglobin: 12 g/dL (ref 12.0–15.0)
Immature Granulocytes: 1 %
Lymphocytes Relative: 35 %
Lymphs Abs: 0.7 10*3/uL (ref 0.7–4.0)
MCH: 32.5 pg (ref 26.0–34.0)
MCHC: 34.1 g/dL (ref 30.0–36.0)
MCV: 95.4 fL (ref 80.0–100.0)
Monocytes Absolute: 0.2 10*3/uL (ref 0.1–1.0)
Monocytes Relative: 9 %
Neutro Abs: 1.1 10*3/uL — ABNORMAL LOW (ref 1.7–7.7)
Neutrophils Relative %: 52 %
Platelets: 171 10*3/uL (ref 150–400)
RBC: 3.69 MIL/uL — ABNORMAL LOW (ref 3.87–5.11)
RDW: 14.5 % (ref 11.5–15.5)
WBC: 2.1 10*3/uL — ABNORMAL LOW (ref 4.0–10.5)
nRBC: 0 % (ref 0.0–0.2)

## 2020-09-18 LAB — COMPREHENSIVE METABOLIC PANEL
ALT: 15 U/L (ref 0–44)
AST: 12 U/L — ABNORMAL LOW (ref 15–41)
Albumin: 3.9 g/dL (ref 3.5–5.0)
Alkaline Phosphatase: 83 U/L (ref 38–126)
Anion gap: 13 (ref 5–15)
BUN: 18 mg/dL (ref 8–23)
CO2: 24 mmol/L (ref 22–32)
Calcium: 8.7 mg/dL — ABNORMAL LOW (ref 8.9–10.3)
Chloride: 104 mmol/L (ref 98–111)
Creatinine, Ser: 1.06 mg/dL — ABNORMAL HIGH (ref 0.44–1.00)
GFR, Estimated: 55 mL/min — ABNORMAL LOW (ref >60)
Glucose, Bld: 230 mg/dL — ABNORMAL HIGH (ref 70–99)
Potassium: 4.4 mmol/L (ref 3.5–5.1)
Sodium: 141 mmol/L (ref 135–145)
Total Bilirubin: 0.4 mg/dL (ref 0.3–1.2)
Total Protein: 6.9 g/dL (ref 6.5–8.1)

## 2020-09-18 MED ORDER — IOHEXOL 300 MG/ML  SOLN
75.0000 mL | Freq: Once | INTRAMUSCULAR | Status: AC | PRN
  Administered 2020-09-18: 75 mL via INTRAVENOUS

## 2020-09-19 LAB — CANCER ANTIGEN 27.29: CA 27.29: 32.3 U/mL (ref 0.0–38.6)

## 2020-09-20 NOTE — Progress Notes (Signed)
[facility]  [phone] [phone]     ID: [name] DOB: [birth year]  MR#: [id number]  BZJ#:[id number]  Patient Care Team: Biagio Borg, MD as PCP - General (Internal Medicine) Alphonsa Overall, MD as Consulting Physician (General Surgery) Kayslee Furey, Virgie Dad, MD as Consulting Physician (Oncology) Kyung Rudd, MD as Consulting Physician (Radiation Oncology) Bobbye Charleston, MD as Consulting Physician (Obstetrics and Gynecology) Nada Libman, MD as Referring Physician (Specialist) Collene Gobble, MD as Consulting Physician (Pulmonary Disease) Alexis Frock, MD as Consulting Physician (Urology) Ander Slade, Carlisle Beers, MD as Referring Physician (Ophthalmology) OTHER MD:   CHIEF COMPLAINT: Estrogen receptor positive breast cancer  CURRENT TREATMENT: Letrozole, palbociclib   INTERVAL HISTORY: [name] returns today for follow-up of her estrogen receptor positive stage IV breast cancer.  She is accompanied by her husband [name]  Since her last visit, she underwent chest CT on 09/18/20 showing: No measurable disease.  Significant atherosclerotic disease.  She continues on letrozole.  Aside from hair thinning she reports no symptoms or complications related to this medication  She does very well with the palbociclib also.  She is currently on 75 mg daily, 21 days on 7 days off.   We are continuing to follow her tumor marker: Lab Results  Component Value Date   CA2729 32.3 09/18/2020   CA2729 32.1 06/30/2020   CA2729 40.4 (H) 04/07/2020   CA2729 27.0 03/10/2020   CA2729 39.1 (H) 12/17/2019   She is scheduled for annual mammography on 10/19/2020.   REVIEW OF SYSTEMS: [name] is getting into worsening problems with access.  We discussed her possibly using the right arm.  She is having symptoms in her neck which are very consistent with arthritis.  She does have severe pain in the right shoulder which is constant.  It is not necessarily worsened  with motion.  It is not better or worse when she moves the neck.  She does not have any back pain.  She has gotten a treadmill and she is doing 3 minutes twice a day right now.  Aside from that a detailed review of systems today was stable   COVID 19 VACCINATION STATUS: Status post [place] x2 followed by booster August 2021   BREAST CANCER HISTORY: From the original intake note:  [name] had screening mammography showing some suspicious calcifications in the right breast leading to right diagnostic mammography with ultrasonography 01/22/2016 at [facility]. The breast density was category C. In the upper right breast there was a 2.3 cm mass with additional masses measuring 0.9 and 0.7 cm. There was also a possible additional 0.8 mass in the lower inner quadrant. Ultrasound confirmed an irregular hypoechoic mass in the right breast upper outer quadrant measuring 2.0 cm. There were other masses measuring 0.7 and 0.8 cm by ultrasonography. The right axilla was sonographically benign.  Biopsy of a 12:00 and 4:00 mass in the right breast 01/22/2016 showed ([id number]) both specimens showing invasive ductal carcinoma, grade 1 or 2, both 95% estrogen receptor positive, both 95% progesterone receptor positive, both with strong staining intensity, with MIB-1 ranging from 10-15%, and both HER-2 negative, the signals ratio being 1.23-1.42, and the number per cell 1.85-2.59.  Her subsequent history is as detailed below   PAST MEDICAL HISTORY: Past Medical History:  Diagnosis Date  . Breast cancer ([place])   . Cancer ([place]) 02/2016   right breast  . DIABETES MELLITUS, TYPE II 01/04/2007   only takes actoplus daily  . Dizziness and giddiness 02/29/2008  .  DVT, HX OF    at age 67 in right buttocks  . Dyspnea    due to lung cancer  . Family history of breast cancer   . GERD 01/04/2007   pt reports resolved   . GLAUCOMA 07/30/2008   both eyes  . History of blood transfusion    no abnormal  reaction  . History of  uterine cancer 2000   hysterectomy done  . HYPERLIPIDEMIA 01/04/2007   taking Pravastatin daily  . HYPERTENSION 01/04/2007   takes Lisinopril daily  . Joint pain   . Joint swelling   . Leg cramps   . LEG PAIN, LEFT 07/06/2007  . NUMBNESS 07/30/2008   in fingers;pt states from Diamox  . OSTEOARTHRITIS, HIP 09/25/2009  . OTITIS MEDIA, ACUTE, BILATERAL 02/29/2008  . Overweight(278.02) 01/04/2007  . Peripheral vascular disease ([place])   . Personal history of radiation therapy 2018  . Pneumonia   . PONV (postoperative nausea and vomiting)   . SLEEP APNEA, OBSTRUCTIVE    doesn't use a cpap;study done about 36yr ago  . TRANSIENT ISCHEMIC ATTACK, HX OF 01/04/2007  . Vision loss    left eye    PAST SURGICAL HISTORY: Past Surgical History:  Procedure Laterality Date  . ABDOMINAL HYSTERECTOMY  2000  . BREAST LUMPECTOMY Right 01/13/2017   x2  . BREAST LUMPECTOMY WITH RADIOACTIVE SEED AND SENTINEL LYMPH NODE BIOPSY Right 01/13/2017   Procedure: RIGHT BREAST RADIOACTIVE SEED X'S 2 GUIDED LUMPECTOMY WITH RADIOACTIVE SEED TARGETED AXILLARYLYMPH NODE EXCISION AND RIGHT AXILLARY SENTINEL LYMPH NODE BIOPSY;  Surgeon: NAlphonsa Overall MD;  [facility]  Service: General;  Laterality: Right;  2 SEEDS IN RIGHT BREAST 1 SEED IN RIGHT AXILLARY NODE  . CHOLECYSTECTOMY    . ENDOBRONCHIAL ULTRASOUND Bilateral 03/28/2016   Procedure: ENDOBRONCHIAL ULTRASOUND;  Surgeon: RCollene Gobble MD;  Location: [facility];  Service: Cardiopulmonary;  Laterality: Bilateral;  . EYE SURGERY  13   shunt left and lazer eye surgery on right cataract and retenia tear with repair  . growth removal  2004   from thumb  . KNEE ARTHROSCOPY Right   . mulitple eye surgeries     both eyes, cataracts with ioc done both eyes  . OOPHORECTOMY    . right lumpectomy with axillary node dissection Right 01/2017  . TOTAL HIP ARTHROPLASTY  06/24/2011   Procedure: TOTAL HIP ARTHROPLASTY;  Surgeon: FKerin Salen  [facility]  Service:  Orthopedics;  Laterality: Right;  . TOTAL HIP ARTHROPLASTY Left 11/12/2012   Dr RMayer Camel . TOTAL HIP ARTHROPLASTY Left 11/12/2012   Procedure: TOTAL HIP ARTHROPLASTY;  Surgeon: FKerin Salen MD;  [facility]  Service: Orthopedics;  Laterality: Left;  DEPUY PINNACLE    FAMILY HISTORY Family History  Problem Relation Age of Onset  . Dementia Mother   . Cancer Mother        Breast and lung cancer  . Stroke Sister   . Breast cancer Sister 519 . Heart attack Maternal Aunt   . Lung cancer Maternal Grandmother        non smoker  . Glaucoma Maternal Grandfather   . Anesthesia problems Neg Hx   The patient's father died at age 75 the patient's mother died at age 75 She had breast and lung cancers diagnosed shortly before her death. The patient had no brothers, 2 sisters. One sister was diagnosed with breast cancer at the age of 575   GYNECOLOGIC HISTORY:  No LMP recorded. Patient  has had a hysterectomy. Menarche age 35, first live birth age 54, the patient is GX P1. She had a hysterectomy for endometrial cancer in the year 2000. She did not take hormone replacement. She did use oral contraceptives for more than 20 years remotely, with no complications.   SOCIAL HISTORY: (Updated July 2021). 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 She is home with her husband [name]. He is a retired Dealer.Their son [name] also lives in [place].  The patient has 3 grandchildren aged 67, 6 and 46    ADVANCED DIRECTIVES: In place   HEALTH MAINTENANCE: Social History   Tobacco Use  . Smoking status: Never Smoker  . Smokeless tobacco: Never Used  Vaping Use  . Vaping Use: Never used  Substance Use Topics  . Alcohol use: No  . Drug use: No     Colonoscopy: Never  PAP: Status post hysterectomy  Bone density: Remote   Allergies  Allergen Reactions  . Codeine Hives    Hycodan syrup  . Fluorescein Nausea And Vomiting     ? IV dye for retina specialist  . Lipitor [Atorvastatin Calcium]     Leg cramp  . Oxycodone Nausea And Vomiting    Patient vomited for 3 days after taking  . Sitagliptin Phosphate Nausea And Vomiting  . Sulfa Drugs Cross Reactors Nausea And Vomiting    Current Outpatient Medications  Medication Sig Dispense Refill  . acetaminophen (TYLENOL) 500 MG tablet Take 1,000 mg by mouth every 4 (four) hours as needed for moderate pain or fever.    Marland Kitchen aspirin EC 81 MG tablet Take 81 mg by mouth daily at 6 PM. 1700    . Biotin 1 MG CAPS Take by mouth.    . brimonidine-timolol (COMBIGAN) 0.2-0.5 % ophthalmic solution Place 1 drop into both eyes three times daily    . cholecalciferol (VITAMIN D) 1000 units tablet Take 1,000 Units by mouth daily.    . dorzolamide-timolol (COSOPT) 22.3-6.8 MG/ML ophthalmic solution Place 1 drop into both eyes 2 (two) times daily.    Marland Kitchen ketoconazole (NIZORAL) 2 % cream Apply 1 application topically daily. 15 g 0  . letrozole (FEMARA) 2.5 MG tablet TAKE 1 TABLET BY MOUTH AT BEDTIME. 90 tablet 4  . lisinopril (ZESTRIL) 5 MG tablet Take 1 tablet (5 mg total) by mouth daily. 90 tablet 3  . lovastatin (MEVACOR) 20 MG tablet Take 1 tablet (20 mg total) by mouth at bedtime. 90 tablet 3  . Netarsudil-Latanoprost (ROCKLATAN) 0.02-0.005 % SOLN Apply to eye.    . palbociclib (IBRANCE) 75 MG tablet TAKE 1 TABLET (75 MG TOTAL) BY MOUTH DAILY. TAKE FOR 21 DAYS ON, 7 DAYS OFF, REPEAT EVERY 28 DAYS. 21 tablet 6  . pioglitazone (ACTOS) 15 MG tablet Take 1 tablet by mouth once daily 90 tablet 0  . prednisoLONE acetate (PRED FORTE) 1 % ophthalmic suspension SMARTSIG:1 In Eye(s) 6 Times Daily     No current facility-administered medications for this visit.     OBJECTIVE: white woman who appears stated age  2:   09/21/20 1058  BP: (!) 158/60  Pulse: 67  Resp: 18  Temp: 97.7 F (36.5 C)  SpO2: 100%   Wt Readings from Last 3 Encounters:  09/21/20 244 lb 1.6 oz (110.7 kg)   06/30/20 241 lb 14.4 oz (109.7 kg)  04/07/20 238 lb 14.4 oz (108.4 kg)   Body mass index is 41.9  kg/m.    ECOG FS:1 - Symptomatic but completely ambulatory  Sclerae unicteric, EOMs intact Wearing a mask No cervical or supraclavicular adenopathy Lungs no rales or rhonchi Heart regular rate and rhythm Abd soft, obese, nontender, positive bowel sounds MSK no focal spinal tenderness, no upper extremity lymphedema Neuro: nonfocal, well oriented, appropriate affect Breasts: Deferred   LAB RESULTS:  CMP     Component Value Date/Time   NA 141 09/18/2020 1447   NA 140 05/29/2017 1254   K 4.4 09/18/2020 1447   K 4.4 05/29/2017 1254   CL 104 09/18/2020 1447   CO2 24 09/18/2020 1447   CO2 25 05/29/2017 1254   GLUCOSE 230 (H) 09/18/2020 1447   GLUCOSE 154 (H) 05/29/2017 1254   BUN 18 09/18/2020 1447   BUN 11.5 05/29/2017 1254   CREATININE 1.06 (H) 09/18/2020 1447   CREATININE 0.77 06/30/2020 1030   CREATININE 0.8 05/29/2017 1254   CALCIUM 8.7 (L) 09/18/2020 1447   CALCIUM 9.3 05/29/2017 1254   PROT 6.9 09/18/2020 1447   PROT 7.0 05/29/2017 1254   ALBUMIN 3.9 09/18/2020 1447   ALBUMIN 4.1 05/29/2017 1254   AST 12 (L) 09/18/2020 1447   AST 17 06/30/2020 1030   AST 13 05/29/2017 1254   ALT 15 09/18/2020 1447   ALT 14 06/30/2020 1030   ALT 14 05/29/2017 1254   ALKPHOS 83 09/18/2020 1447   ALKPHOS 66 05/29/2017 1254   BILITOT 0.4 09/18/2020 1447   BILITOT 0.5 06/30/2020 1030   BILITOT 0.50 05/29/2017 1254   GFRNONAA 55 (L) 09/18/2020 1447   GFRNONAA >60 06/30/2020 1030   GFRAA >60 03/10/2020 1138    INo results found for: SPEP, UPEP  Lab Results  Component Value Date   WBC 2.1 (L) 09/18/2020   NEUTROABS 1.1 (L) 09/18/2020   HGB 12.0 09/18/2020   HCT 35.2 (L) 09/18/2020   MCV 95.4 09/18/2020   PLT 171 09/18/2020      Chemistry      Component Value Date/Time   NA 141 09/18/2020 1447   NA 140 05/29/2017 1254   K 4.4 09/18/2020 1447   K 4.4 05/29/2017 1254    CL 104 09/18/2020 1447   CO2 24 09/18/2020 1447   CO2 25 05/29/2017 1254   BUN 18 09/18/2020 1447   BUN 11.5 05/29/2017 1254   CREATININE 1.06 (H) 09/18/2020 1447   CREATININE 0.77 06/30/2020 1030   CREATININE 0.8 05/29/2017 1254      Component Value Date/Time   CALCIUM 8.7 (L) 09/18/2020 1447   CALCIUM 9.3 05/29/2017 1254   ALKPHOS 83 09/18/2020 1447   ALKPHOS 66 05/29/2017 1254   AST 12 (L) 09/18/2020 1447   AST 17 06/30/2020 1030   AST 13 05/29/2017 1254   ALT 15 09/18/2020 1447   ALT 14 06/30/2020 1030   ALT 14 05/29/2017 1254   BILITOT 0.4 09/18/2020 1447   BILITOT 0.5 06/30/2020 1030   BILITOT 0.50 05/29/2017 1254       No results found for: LABCA2  No components found for: QZESP233  No results for input(s): INR in the last 168 hours.  Urinalysis    Component Value Date/Time   COLORURINE YELLOW 08/18/2017 1322   APPEARANCEUR HAZY (A) 08/18/2017 1322   LABSPEC 1.010 08/18/2017 1322   PHURINE 7.0 08/18/2017 1322   GLUCOSEU NEGATIVE 08/18/2017 1322   GLUCOSEU NEGATIVE 10/30/2014 1513   HGBUR LARGE (A) 08/18/2017 1322   BILIRUBINUR NEGATIVE 08/18/2017 1322   KETONESUR NEGATIVE 08/18/2017 1322  PROTEINUR 30 (A) 08/18/2017 1322   UROBILINOGEN 0.2 10/30/2014 1513   NITRITE NEGATIVE 08/18/2017 1322   LEUKOCYTESUR LARGE (A) 08/18/2017 1322    STUDIES: CT Chest W Contrast  Result Date: 09/21/2020 CLINICAL DATA:  75 year old female with history of breast cancer. Evaluate for metastatic disease. Chest pain and shortness of breath. EXAM: CT CHEST WITH CONTRAST TECHNIQUE: Multidetector CT imaging of the chest was performed during intravenous contrast administration. CONTRAST:  24m OMNIPAQUE IOHEXOL 300 MG/ML  SOLN COMPARISON:  Chest CT 04/03/2020. FINDINGS: Cardiovascular: Heart size is normal. There is no significant pericardial fluid, thickening or pericardial calcification. There is aortic atherosclerosis, as well as atherosclerosis of the great vessels of the  mediastinum and the coronary arteries, including calcified atherosclerotic plaque in the left main, left anterior descending, left circumflex and right coronary arteries. Mediastinum/Nodes: No pathologically enlarged mediastinal or hilar lymph nodes. Esophagus is unremarkable in appearance. No axillary lymphadenopathy. Lungs/Pleura: No acute consolidative airspace disease. No pleural effusions. Some patchy areas of post infectious or inflammatory scarring are again noted in the posterior aspect of the left lower lobe and anterior aspects of the upper lobes of the lungs bilaterally (right greater than left). No suspicious appearing pulmonary nodules or masses are noted. Upper Abdomen: Status post cholecystectomy. Mild intrahepatic and severe extrahepatic biliary ductal dilatation, stable compared to the prior study, presumably reflective of benign post cholecystectomy physiology. Atherosclerosis in the abdominal aorta. Small left adrenal nodule incompletely characterized but stable compared to prior studies measuring 1 cm, likely a adenoma. 3 cm intermediate attenuation lesion (42 HU) in the upper pole of the right kidney, incompletely characterize, but stable compared to prior studies, favored to represent a proteinaceous cyst. Musculoskeletal: Post lobectomy changes in the right breast where there is also some asymmetric skin thickening, presumably related to prior radiation therapy. There are no aggressive appearing lytic or blastic lesions noted in the visualized portions of the skeleton. IMPRESSION: 1. No findings to suggest metastatic disease in the thorax. 2. Aortic atherosclerosis, in addition to left main and 3 vessel coronary artery disease. Assessment for potential risk factor modification, dietary therapy or pharmacologic therapy may be warranted, if clinically indicated. 3. Additional incidental findings, similar to prior studies, as above. Aortic Atherosclerosis (ICD10-I70.0). Electronically Signed    By: DVinnie LangtonM.D.   On: 09/21/2020 10:29      ELIGIBLE FOR AVAILABLE RESEARCH PROTOCOL: no  ASSESSMENT: 75y.o. [place] woman with a remote history of early stage endometrial cancer, subsequently status post right breast upper outer quadrant biopsy 01/22/2016 for a clinically multifocal T2 N0, stage 2A invasive ductal carcinoma, grade 1, estrogen and progesterone receptor positive, HER-2 negative, with an MIB-1 between 10 and 15%.  (1) right axillary lymph node biopsy 03/02/2016 positive  (2) genetics testing 01/13/2016 through the Custom gene panel offered by [facility] found no deleterious mutations in  ATM, BARD1, BRCA1, BRCA2, BRIP1, CDH1, CHEK2, EPCAM, FANCC, MLH1, MSH2, MSH6, MUTYH, NBN, PALB2, PMS2, POLD1, PTEN, RAD51C, RAD51D, TP53, and XRCC2  METASTATIC DISEASE: OCT 2017 (3) CT scans of the chest abdomen and pelvis obtained 03/10/2016 are consistent with bilateral lung metastases and mediastinal and hilar nodal involvement, but no liver or bone spread  (a) bronchoscopic lymph node biopsy 2 (station 7, 13R) 03/28/2016 confirms metastatic adenocarcinoma, estrogen receptor positive, HER-2 not amplified  (b) baseline CA-27-29 on 04/18/2016 was 137.5.  (4) letrozole started 03/15/2016, palbociclib added 03/29/2016 at 125 mg/day, 21/7  (a) dose decreased to 100 mg per day, 21/7, beginning with  February cycle  (b) palbociclib held 01/31/2017, with increasing symptoms  (c) palbociclib resumed October 2018 at 75 mg daily  (d) palbociclib dose reduced to 75 mg every other day February through April 2019  (e) palbociclib dose resumed at 75 mg daily as of 10/17/2017  (f) palbociclib dose decreased to 75 mg every other day beginning 07/31/2019  (g) palbociclib resumed at 75 mg daily, 21 days on 7 off, as of 08/25/2019  (5) status post double right lumpectomies and right axillary lymph node sampling 01/13/2017 for 2 separate invasive ductal carcinoma lesions, pT1a and pT1b, N1a,  with negative margins, both lesions being estrogen and progesterone receptor positive and HER-2 negative  (6) adjuvant radiation completed 07/05/2017 1. 50.4 Gy in 28 fractions to the right breast and supraclavicular region using whole-breast tangent fields. 2. Boost to the seroma delivered an additional 10 Gy in 5 fractions. The total dose was 60.4 Gy.  (7) restaging studies:  (a) CT scan of the chest and bone scan 06/23/2017 showed stable scattered very small lung nodules, no bone lesions  (b) CT of the chest 10/17/2017 showed no new or progressive metastatic disease in the chest. The small left lower lobe pulmonary nodule is stable  (c) PET scan on 03/02/2018: shows no findings for residual or recurrent right breast cancer  (d) chest CT scan stable, questionable right renal cyst noted  (e) chest CT 09/18/2020 shows no measurable disease   (8) right upper pole renal lesion noted to be enlarging on CT scan 06/22/2017  (a) no uptake on PET scan obtained 03/02/2018   PLAN: [facility] is now 4-1/2 years out from definitive diagnosis of metastatic breast cancer.  Her disease is very well controlled, with no measurable disease at present, and no symptoms related to her disease.  She is also tolerating letrozole and palbociclib well.  We are continuing the palbociclib at the same dose.  Her next cycle will start 09/23/2020 and she knows to call the company to get it mailed on time.  At this point we can start seeing her on an every 59-monthbasis.  She is very pleased with that.  He is already scheduled for mammography in May.  I think it would be a good idea to obtain an MRI of the right shoulder.  That will show I hope that there is no cancer there.  It may show significant problems in terms of rotator cuff and possible labral tear and she might benefit from referral to an orthopedist depending on those results.  She is agreeable.  I am delighted that she is getting on the treadmill twice daily.  I  think by the time she sees me again in late May she may have doubled the time on the treadmill she spends every day.  Total encounter time today 35 minutes.*   Earnie Rockhold, GVirgie Dad MD  09/21/20 11:30 AM [facility] [place] [id number]. [phone]   Fax. [phone]  I, KWilburn Mylar am acting as scribe for Dr. GVirgie Dad Tremar Wickens.  I, GLurline DelMD, have reviewed the above documentation for accuracy and completeness, and I agree with the above.   *Total Encounter Time as defined by the Centers for Medicare and Medicaid Services includes, in addition to the face-to-face time of a patient visit (documented in the note above) non-face-to-face time: obtaining and reviewing outside history, ordering and reviewing medications, tests or procedures, care coordination (communications with other health  care professionals or caregivers) and documentation in the medical record.

## 2020-09-21 ENCOUNTER — Inpatient Hospital Stay (HOSPITAL_BASED_OUTPATIENT_CLINIC_OR_DEPARTMENT_OTHER): Payer: Medicare Other | Admitting: Oncology

## 2020-09-21 ENCOUNTER — Inpatient Hospital Stay: Payer: Medicare Other

## 2020-09-21 ENCOUNTER — Other Ambulatory Visit: Payer: Self-pay

## 2020-09-21 VITALS — BP 158/60 | HR 67 | Temp 97.7°F | Resp 18 | Ht 64.0 in | Wt 244.1 lb

## 2020-09-21 DIAGNOSIS — G4733 Obstructive sleep apnea (adult) (pediatric): Secondary | ICD-10-CM | POA: Diagnosis not present

## 2020-09-21 DIAGNOSIS — C7800 Secondary malignant neoplasm of unspecified lung: Secondary | ICD-10-CM

## 2020-09-21 DIAGNOSIS — Z7982 Long term (current) use of aspirin: Secondary | ICD-10-CM | POA: Diagnosis not present

## 2020-09-21 DIAGNOSIS — Z170 Estrogen receptor positive status [ER+]: Secondary | ICD-10-CM

## 2020-09-21 DIAGNOSIS — E1151 Type 2 diabetes mellitus with diabetic peripheral angiopathy without gangrene: Secondary | ICD-10-CM | POA: Diagnosis not present

## 2020-09-21 DIAGNOSIS — Z8673 Personal history of transient ischemic attack (TIA), and cerebral infarction without residual deficits: Secondary | ICD-10-CM | POA: Diagnosis not present

## 2020-09-21 DIAGNOSIS — Z923 Personal history of irradiation: Secondary | ICD-10-CM | POA: Diagnosis not present

## 2020-09-21 DIAGNOSIS — E1136 Type 2 diabetes mellitus with diabetic cataract: Secondary | ICD-10-CM | POA: Diagnosis not present

## 2020-09-21 DIAGNOSIS — Z7984 Long term (current) use of oral hypoglycemic drugs: Secondary | ICD-10-CM | POA: Diagnosis not present

## 2020-09-21 DIAGNOSIS — Z85118 Personal history of other malignant neoplasm of bronchus and lung: Secondary | ICD-10-CM | POA: Diagnosis not present

## 2020-09-21 DIAGNOSIS — E785 Hyperlipidemia, unspecified: Secondary | ICD-10-CM | POA: Diagnosis not present

## 2020-09-21 DIAGNOSIS — I2510 Atherosclerotic heart disease of native coronary artery without angina pectoris: Secondary | ICD-10-CM | POA: Diagnosis not present

## 2020-09-21 DIAGNOSIS — Z86718 Personal history of other venous thrombosis and embolism: Secondary | ICD-10-CM | POA: Diagnosis not present

## 2020-09-21 DIAGNOSIS — C50411 Malignant neoplasm of upper-outer quadrant of right female breast: Secondary | ICD-10-CM

## 2020-09-21 DIAGNOSIS — Z803 Family history of malignant neoplasm of breast: Secondary | ICD-10-CM | POA: Diagnosis not present

## 2020-09-21 DIAGNOSIS — Z8542 Personal history of malignant neoplasm of other parts of uterus: Secondary | ICD-10-CM | POA: Diagnosis not present

## 2020-09-21 DIAGNOSIS — Z79899 Other long term (current) drug therapy: Secondary | ICD-10-CM | POA: Diagnosis not present

## 2020-09-21 DIAGNOSIS — I700 Atherosclerosis of aorta: Secondary | ICD-10-CM | POA: Diagnosis not present

## 2020-09-21 DIAGNOSIS — K219 Gastro-esophageal reflux disease without esophagitis: Secondary | ICD-10-CM | POA: Diagnosis not present

## 2020-09-21 DIAGNOSIS — I10 Essential (primary) hypertension: Secondary | ICD-10-CM | POA: Diagnosis not present

## 2020-09-21 DIAGNOSIS — Z79811 Long term (current) use of aromatase inhibitors: Secondary | ICD-10-CM | POA: Diagnosis not present

## 2020-09-22 ENCOUNTER — Telehealth: Payer: Self-pay

## 2020-09-22 NOTE — Telephone Encounter (Signed)
Pt called stating she will not be going for MRI ordered by Dr Jana Hakim for her (r) shoulder as she wants to first try home remedies and if it persists she will go for the MRI.

## 2020-09-23 ENCOUNTER — Telehealth: Payer: Self-pay | Admitting: Oncology

## 2020-09-23 NOTE — Telephone Encounter (Signed)
Scheduled per 4/11 los. Called and spoke with pt, confirmed 5/31 appts

## 2020-10-05 ENCOUNTER — Telehealth: Payer: Self-pay | Admitting: Internal Medicine

## 2020-10-05 NOTE — Telephone Encounter (Signed)
Unfortunately I believe chromium is not an FDA approved medication, and though I think this likely would not cause a problem, I cannot be sure, and it should only be taken at her own risk.   There are currently no medications approved for long term use for obesity as well, and there have been several that have been taken off the market due to high risk such as belviq and fenfluramine.  So I am not very optimistic about current options these days, except for reduced calories and more activity if that is possible.  thanks

## 2020-10-05 NOTE — Telephone Encounter (Signed)
Patient called and said that she had bought Chromium to try to loose weight. She was wondering if that was okay to take with her other medications. She said if not could Dr. Jenny Reichmann recommend something else. She can be reached at [phone]. Please advise

## 2020-10-06 NOTE — Telephone Encounter (Signed)
Patient notified

## 2020-10-14 ENCOUNTER — Other Ambulatory Visit: Payer: Self-pay | Admitting: Oncology

## 2020-10-14 DIAGNOSIS — Z853 Personal history of malignant neoplasm of breast: Secondary | ICD-10-CM

## 2020-10-19 ENCOUNTER — Ambulatory Visit
Admission: RE | Admit: 2020-10-19 | Discharge: 2020-10-19 | Disposition: A | Discharge status: Home / Self Care | Payer: Medicare Other | Source: Ambulatory Visit | Attending: Oncology | Admitting: Oncology

## 2020-10-19 ENCOUNTER — Other Ambulatory Visit: Payer: Self-pay

## 2020-10-19 DIAGNOSIS — Z853 Personal history of malignant neoplasm of breast: Secondary | ICD-10-CM

## 2020-10-19 DIAGNOSIS — R922 Inconclusive mammogram: Secondary | ICD-10-CM | POA: Diagnosis not present

## 2020-10-20 ENCOUNTER — Other Ambulatory Visit (HOSPITAL_COMMUNITY): Payer: Self-pay

## 2020-10-20 MED FILL — Letrozole Tab 2.5 MG: ORAL | 90 days supply | Qty: 90 | Fill #0 | Status: AC

## 2020-11-09 ENCOUNTER — Other Ambulatory Visit: Payer: Self-pay | Admitting: Internal Medicine

## 2020-11-09 NOTE — Progress Notes (Signed)
[place]  [phone] [phone]) [phone]     ID: [name] DOB: 1945-08-25  MR#: [id number]  YCX#:[id number]  Patient Care Team: Biagio Borg, MD as PCP - General (Internal Medicine) Alphonsa Overall, MD as Consulting Physician (General Surgery) Ima Hafner, Virgie Dad, MD as Consulting Physician (Oncology) Kyung Rudd, MD as Consulting Physician (Radiation Oncology) Bobbye Charleston, MD as Consulting Physician (Obstetrics and Gynecology) Nada Libman, MD as Referring Physician (Specialist) Collene Gobble, MD as Consulting Physician (Pulmonary Disease) Alexis Frock, MD as Consulting Physician (Urology) Ander Slade, Carlisle Beers, MD as Referring Physician (Ophthalmology) OTHER MD:   CHIEF COMPLAINT: Estrogen receptor positive breast cancer  CURRENT TREATMENT: Letrozole, palbociclib   INTERVAL HISTORY: [name] returns today for follow-up of her estrogen receptor positive stage IV breast cancer.  She is accompanied by her husband [name]  Since her last visit, she underwent bilateral diagnostic mammography with tomography at [place] on 10/19/2020 showing: breast density category C; no evidence of malignancy in either breast.   She continues on letrozole.  Aside from hair thinning she reports no symptoms or complications related to this medication  She does very well with the palbociclib also.  She is currently on 75 mg daily, 21 days on 7 days off.   We are continuing to follow her tumor marker: Lab Results  Component Value Date   CA2729 32.3 09/18/2020   CA2729 32.1 06/30/2020   CA2729 40.4 (H) 04/07/2020   CA2729 27.0 03/10/2020   CA2729 39.1 (H) 12/17/2019    REVIEW OF SYSTEMS: [name] is trying to lose weight.  She lost 10 pounds and then she gained it back.  She is very concerned about this but she notes this has been a lifelong issue for her.  Aside from that she tells me she really does not like having mammograms and she wish wishes she  did not have to have them.  She is very concerned that her [employer] "scholarship" May and September of this year.  Aside from these issues a detailed review of systems today was stable   COVID 19 VACCINATION STATUS: Status post [place] x2 followed by booster August 2021   BREAST CANCER HISTORY: From the original intake note:  [name] had screening mammography showing some suspicious calcifications in the right breast leading to right diagnostic mammography with ultrasonography 01/22/2016 at [facility]. The breast density was category C. In the upper right breast there was a 2.3 cm mass with additional masses measuring 0.9 and 0.7 cm. There was also a possible additional 0.8 mass in the lower inner quadrant. Ultrasound confirmed an irregular hypoechoic mass in the right breast upper outer quadrant measuring 2.0 cm. There were other masses measuring 0.7 and 0.8 cm by ultrasonography. The right axilla was sonographically benign.  Biopsy of a 12:00 and 4:00 mass in the right breast 01/22/2016 showed ([id number]) both specimens showing invasive ductal carcinoma, grade 1 or 2, both 95% estrogen receptor positive, both 95% progesterone receptor positive, both with strong staining intensity, with MIB-1 ranging from 10-15%, and both HER-2 negative, the signals ratio being 1.23-1.42, and the number per cell 1.85-2.59.  Her subsequent history is as detailed below   PAST MEDICAL HISTORY: Past Medical History:  Diagnosis Date  . Breast cancer ([place])   . Cancer ([place]) 02/2016   right breast  . DIABETES MELLITUS, TYPE II 01/04/2007   only takes actoplus daily  . Dizziness and giddiness 02/29/2008  . DVT, HX OF    at age  26 in right buttocks  . Dyspnea    due to lung cancer  . Family history of breast cancer   . GERD 01/04/2007   pt reports resolved   . GLAUCOMA 07/30/2008   both eyes  . History of blood transfusion    no abnormal  reaction  . History of uterine cancer 2000   hysterectomy done  .  HYPERLIPIDEMIA 01/04/2007   taking Pravastatin daily  . HYPERTENSION 01/04/2007   takes Lisinopril daily  . Joint pain   . Joint swelling   . Leg cramps   . LEG PAIN, LEFT 07/06/2007  . NUMBNESS 07/30/2008   in fingers;pt states from Diamox  . OSTEOARTHRITIS, HIP 09/25/2009  . OTITIS MEDIA, ACUTE, BILATERAL 02/29/2008  . Overweight(278.02) 01/04/2007  . Peripheral vascular disease ([place])   . Personal history of radiation therapy 2018  . Pneumonia   . PONV (postoperative nausea and vomiting)   . SLEEP APNEA, OBSTRUCTIVE    doesn't use a cpap;study done about 19yrs ago  . TRANSIENT ISCHEMIC ATTACK, HX OF 01/04/2007  . Vision loss    left eye    PAST SURGICAL HISTORY: Past Surgical History:  Procedure Laterality Date  . ABDOMINAL HYSTERECTOMY  2000  . BREAST LUMPECTOMY Right 01/13/2017   x2  . BREAST LUMPECTOMY WITH RADIOACTIVE SEED AND SENTINEL LYMPH NODE BIOPSY Right 01/13/2017   Procedure: RIGHT BREAST RADIOACTIVE SEED X'S 2 GUIDED LUMPECTOMY WITH RADIOACTIVE SEED TARGETED AXILLARYLYMPH NODE EXCISION AND RIGHT AXILLARY SENTINEL LYMPH NODE BIOPSY;  Surgeon: Alphonsa Overall, MD;  Location: [facility];  Service: General;  Laterality: Right;  2 SEEDS IN RIGHT BREAST 1 SEED IN RIGHT AXILLARY NODE  . CHOLECYSTECTOMY    . ENDOBRONCHIAL ULTRASOUND Bilateral 03/28/2016   Procedure: ENDOBRONCHIAL ULTRASOUND;  Surgeon: Collene Gobble, MD;  Location: [facility];  Service: Cardiopulmonary;  Laterality: Bilateral;  . EYE SURGERY  13   shunt left and lazer eye surgery on right cataract and retenia tear with repair  . growth removal  2004   from thumb  . KNEE ARTHROSCOPY Right   . mulitple eye surgeries     both eyes, cataracts with ioc done both eyes  . OOPHORECTOMY    . right lumpectomy with axillary node dissection Right 01/2017  . TOTAL HIP ARTHROPLASTY  06/24/2011   Procedure: TOTAL HIP ARTHROPLASTY;  Surgeon: Kerin Salen;  Location: [facility];  Service: Orthopedics;  Laterality: Right;  . TOTAL HIP  ARTHROPLASTY Left 11/12/2012   Dr Mayer Camel  . TOTAL HIP ARTHROPLASTY Left 11/12/2012   Procedure: TOTAL HIP ARTHROPLASTY;  Surgeon: Kerin Salen, MD;  Location: [facility];  Service: Orthopedics;  Laterality: Left;  DEPUY PINNACLE    FAMILY HISTORY Family History  Problem Relation Age of Onset  . Dementia Mother   . Cancer Mother        Breast and lung cancer  . Stroke Sister   . Breast cancer Sister 34  . Heart attack Maternal Aunt   . Lung cancer Maternal Grandmother        non smoker  . Glaucoma Maternal Grandfather   . Anesthesia problems Neg Hx   The patient's father died at age 17, the patient's mother died at age 60. She had breast and lung cancers diagnosed shortly before her death. The patient had no brothers, 2 sisters. One sister was diagnosed with breast cancer at the age of 34.   GYNECOLOGIC HISTORY:  No LMP recorded. Patient has had a hysterectomy. Menarche age 64, first  live birth age 60, the patient is GX P1. She had a hysterectomy for endometrial cancer in the year 2000. She did not take hormone replacement. She did use oral contraceptives for more than 20 years remotely, with no complications.   SOCIAL HISTORY: (Updated July 2021). 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 She is home with her husband [name]. He is a retired Dealer.Their son [name] also lives in [place].  The patient has 3 grandchildren aged 79, 26 and 72    ADVANCED DIRECTIVES: In place   HEALTH MAINTENANCE: Social History   Tobacco Use  . Smoking status: Never Smoker  . Smokeless tobacco: Never Used  Vaping Use  . Vaping Use: Never used  Substance Use Topics  . Alcohol use: No  . Drug use: No     Colonoscopy: Never  PAP: Status post hysterectomy  Bone density: Remote   Allergies  Allergen Reactions  . Codeine Hives    Hycodan syrup  . Fluorescein Nausea And Vomiting    ? IV dye for retina specialist  . Lipitor  [Atorvastatin Calcium]     Leg cramp  . Oxycodone Nausea And Vomiting    Patient vomited for 3 days after taking  . Sitagliptin Phosphate Nausea And Vomiting  . Sulfa Drugs Cross Reactors Nausea And Vomiting    Current Outpatient Medications  Medication Sig Dispense Refill  . acetaminophen (TYLENOL) 500 MG tablet Take 1,000 mg by mouth every 4 (four) hours as needed for moderate pain or fever.    Marland Kitchen aspirin EC 81 MG tablet Take 81 mg by mouth daily at 6 PM. 1700    . Biotin 1 MG CAPS Take by mouth.    . brimonidine-timolol (COMBIGAN) 0.2-0.5 % ophthalmic solution Place 1 drop into both eyes three times daily    . cholecalciferol (VITAMIN D) 1000 units tablet Take 1,000 Units by mouth daily.    . dorzolamide-timolol (COSOPT) 22.3-6.8 MG/ML ophthalmic solution Place 1 drop into both eyes 2 (two) times daily.    Marland Kitchen ketoconazole (NIZORAL) 2 % cream Apply 1 application topically daily. 15 g 0  . letrozole (FEMARA) 2.5 MG tablet TAKE 1 TABLET BY MOUTH AT BEDTIME. 90 tablet 4  . lisinopril (ZESTRIL) 5 MG tablet Take 1 tablet (5 mg total) by mouth daily. 90 tablet 3  . lovastatin (MEVACOR) 20 MG tablet Take 1 tablet (20 mg total) by mouth at bedtime. 90 tablet 3  . Netarsudil-Latanoprost (ROCKLATAN) 0.02-0.005 % SOLN Apply to eye.    . palbociclib (IBRANCE) 75 MG tablet TAKE 1 TABLET (75 MG TOTAL) BY MOUTH DAILY. TAKE FOR 21 DAYS ON, 7 DAYS OFF, REPEAT EVERY 28 DAYS. 21 tablet 6  . pioglitazone (ACTOS) 15 MG tablet Take 1 tablet by mouth once daily 90 tablet 0  . prednisoLONE acetate (PRED FORTE) 1 % ophthalmic suspension SMARTSIG:1 In Eye(s) 6 Times Daily     No current facility-administered medications for this visit.     OBJECTIVE: white woman who appears stated age  75:   11/10/20 1218  BP: (!) 165/62  Pulse: 64  Resp: 18  Temp: (!) 97.5 F (36.4 C)  SpO2: 100%   Wt Readings from Last 3 Encounters:  11/10/20 241 lb 12.8 oz (109.7 kg)  09/21/20 244 lb 1.6 oz (110.7 kg)   06/30/20 241 lb 14.4 oz (109.7 kg)   Body mass index is 41.5 kg/m.    ECOG FS:1 -  Symptomatic but completely ambulatory  Sclerae unicteric, EOMs intact Wearing a mask No cervical or supraclavicular adenopathy Lungs no rales or rhonchi Heart regular rate and rhythm Abd soft, nontender, positive bowel sounds MSK no focal spinal tenderness, no upper extremity lymphedema Neuro: nonfocal, well oriented, appropriate affect Breasts: Deferred   LAB RESULTS:  CMP     Component Value Date/Time   NA 141 09/18/2020 1447   NA 140 05/29/2017 1254   K 4.4 09/18/2020 1447   K 4.4 05/29/2017 1254   CL 104 09/18/2020 1447   CO2 24 09/18/2020 1447   CO2 25 05/29/2017 1254   GLUCOSE 230 (H) 09/18/2020 1447   GLUCOSE 154 (H) 05/29/2017 1254   BUN 18 09/18/2020 1447   BUN 11.5 05/29/2017 1254   CREATININE 1.06 (H) 09/18/2020 1447   CREATININE 0.77 06/30/2020 1030   CREATININE 0.8 05/29/2017 1254   CALCIUM 8.7 (L) 09/18/2020 1447   CALCIUM 9.3 05/29/2017 1254   PROT 6.9 09/18/2020 1447   PROT 7.0 05/29/2017 1254   ALBUMIN 3.9 09/18/2020 1447   ALBUMIN 4.1 05/29/2017 1254   AST 12 (L) 09/18/2020 1447   AST 17 06/30/2020 1030   AST 13 05/29/2017 1254   ALT 15 09/18/2020 1447   ALT 14 06/30/2020 1030   ALT 14 05/29/2017 1254   ALKPHOS 83 09/18/2020 1447   ALKPHOS 66 05/29/2017 1254   BILITOT 0.4 09/18/2020 1447   BILITOT 0.5 06/30/2020 1030   BILITOT 0.50 05/29/2017 1254   GFRNONAA 55 (L) 09/18/2020 1447   GFRNONAA >60 06/30/2020 1030   GFRAA >60 03/10/2020 1138    INo results found for: SPEP, UPEP  Lab Results  Component Value Date   WBC 2.4 (L) 11/10/2020   NEUTROABS 1.3 (L) 11/10/2020   HGB 12.9 11/10/2020   HCT 38.2 11/10/2020   MCV 94.3 11/10/2020   PLT 183 11/10/2020      Chemistry      Component Value Date/Time   NA 141 09/18/2020 1447   NA 140 05/29/2017 1254   K 4.4 09/18/2020 1447   K 4.4 05/29/2017 1254   CL 104 09/18/2020 1447   CO2 24 09/18/2020  1447   CO2 25 05/29/2017 1254   BUN 18 09/18/2020 1447   BUN 11.5 05/29/2017 1254   CREATININE 1.06 (H) 09/18/2020 1447   CREATININE 0.77 06/30/2020 1030   CREATININE 0.8 05/29/2017 1254      Component Value Date/Time   CALCIUM 8.7 (L) 09/18/2020 1447   CALCIUM 9.3 05/29/2017 1254   ALKPHOS 83 09/18/2020 1447   ALKPHOS 66 05/29/2017 1254   AST 12 (L) 09/18/2020 1447   AST 17 06/30/2020 1030   AST 13 05/29/2017 1254   ALT 15 09/18/2020 1447   ALT 14 06/30/2020 1030   ALT 14 05/29/2017 1254   BILITOT 0.4 09/18/2020 1447   BILITOT 0.5 06/30/2020 1030   BILITOT 0.50 05/29/2017 1254       No results found for: LABCA2  No components found for: ONGEX528  No results for input(s): INR in the last 168 hours.  Urinalysis    Component Value Date/Time   COLORURINE YELLOW 08/18/2017 1322   APPEARANCEUR HAZY (A) 08/18/2017 1322   LABSPEC 1.010 08/18/2017 1322   PHURINE 7.0 08/18/2017 1322   GLUCOSEU NEGATIVE 08/18/2017 1322   GLUCOSEU NEGATIVE 10/30/2014 1513   HGBUR LARGE (A) 08/18/2017 1322   BILIRUBINUR NEGATIVE 08/18/2017 1322   KETONESUR NEGATIVE 08/18/2017 1322   PROTEINUR 30 (A) 08/18/2017 1322   UROBILINOGEN 0.2  10/30/2014 1513   NITRITE NEGATIVE 08/18/2017 1322   LEUKOCYTESUR LARGE (A) 08/18/2017 1322    STUDIES: MM DIAG BREAST TOMO BILATERAL  Result Date: 10/19/2020 CLINICAL DATA:  75 year old female status post malignant right lumpectomy in 2018. EXAM: DIGITAL DIAGNOSTIC BILATERAL MAMMOGRAM WITH TOMOSYNTHESIS AND CAD TECHNIQUE: Bilateral digital diagnostic mammography and breast tomosynthesis was performed. The images were evaluated with computer-aided detection. COMPARISON:  Previous exam(s). ACR Breast Density Category c: The breast tissue is heterogeneously dense, which may obscure small masses. FINDINGS: Stable post lumpectomy changes noted in the central right breast. No new or suspicious findings in either breast. The parenchymal pattern is stable. IMPRESSION: 1.  No mammographic evidence of malignancy in either breast. 2. Stable right breast posttreatment changes. RECOMMENDATION: Per protocol, as the patient is now 2 or more years status post lumpectomy, she may return to annual screening mammography in 1 year. However, given the history of breast cancer, the patient remains eligible for annual diagnostic mammography if preferred. I have discussed the findings and recommendations with the patient. If applicable, a reminder letter will be sent to the patient regarding the next appointment. BI-RADS CATEGORY  2: Benign. Electronically Signed   By: Kristopher Oppenheim M.D.   On: 10/19/2020 10:55      ELIGIBLE FOR AVAILABLE RESEARCH PROTOCOL: no  ASSESSMENT: 75 y.o. [place] woman with a remote history of early stage endometrial cancer, subsequently status post right breast upper outer quadrant biopsy 01/22/2016 for a clinically multifocal T2 N0, stage 2A invasive ductal carcinoma, grade 1, estrogen and progesterone receptor positive, HER-2 negative, with an MIB-1 between 10 and 15%.  (1) right axillary lymph node biopsy 03/02/2016 positive  (2) genetics testing 01/13/2016 through the Custom gene panel offered by [facility] found no deleterious mutations in  ATM, BARD1, BRCA1, BRCA2, BRIP1, CDH1, CHEK2, EPCAM, FANCC, MLH1, MSH2, MSH6, MUTYH, NBN, PALB2, PMS2, POLD1, PTEN, RAD51C, RAD51D, TP53, and XRCC2  METASTATIC DISEASE: OCT 2017 (3) CT scans of the chest abdomen and pelvis obtained 03/10/2016 are consistent with bilateral lung metastases and mediastinal and hilar nodal involvement, but no liver or bone spread  (a) bronchoscopic lymph node biopsy 2 (station 7, 13R) 03/28/2016 confirms metastatic adenocarcinoma, estrogen receptor positive, HER-2 not amplified  (b) baseline CA-27-29 on 04/18/2016 was 137.5.  (4) letrozole started 03/15/2016, palbociclib added 03/29/2016 at 125 mg/day, 21/7  (a) dose decreased to 100 mg per day, 21/7, beginning with February  cycle  (b) palbociclib held 01/31/2017, with increasing symptoms  (c) palbociclib resumed October 2018 at 75 mg daily  (d) palbociclib dose reduced to 75 mg every other day February through April 2019  (e) palbociclib dose resumed at 75 mg daily as of 10/17/2017  (f) palbociclib dose decreased to 75 mg every other day beginning 07/31/2019  (g) palbociclib resumed at 75 mg daily, 21 days on 7 off, as of 08/25/2019  (5) status post double right lumpectomies and right axillary lymph node sampling 01/13/2017 for 2 separate invasive ductal carcinoma lesions, pT1a and pT1b, N1a, with negative margins, both lesions being estrogen and progesterone receptor positive and HER-2 negative  (6) adjuvant radiation completed 07/05/2017 1. 50.4 Gy in 28 fractions to the right breast and supraclavicular region using whole-breast tangent fields. 2. Boost to the seroma delivered an additional 10 Gy in 5 fractions. The total dose was 60.4 Gy.  (7) restaging studies:  (a) CT scan of the chest and bone scan 06/23/2017 showed stable scattered very small lung nodules, no bone lesions  (b) CT  of the chest 10/17/2017 showed no new or progressive metastatic disease in the chest. The small left lower lobe pulmonary nodule is stable  (c) PET scan on 03/02/2018: shows no findings for residual or recurrent right breast cancer  (d) chest CT scan stable, questionable right renal cyst noted  (e) chest CT 09/18/2020 shows no measurable disease   (8) right upper pole renal lesion noted to be enlarging on CT scan 06/22/2017  (a) no uptake on PET scan obtained 03/02/2018   PLAN: Rayssa is now just about 5 years out from definitive diagnosis of metastatic breast cancer, with very well-controlled disease.  This is very favorable.  It is also favorable that she is still on her initial treatment with letrozole and palbociclib.  She understands we will be continuing this so long as she can tolerate it and there is no evidence of  disease progression.  Her ANC is acceptable.  She really dislikes breast exams and she understands that they are not that sensitive.  She is also concerned about the density of her breast which is C.  All this was discussed in detail today.  Which she would like to do is see [place] every 3 months with labs and physical exam including a breast exam.  I am fine with that so long as she has a low threshold to call us with any questions or concerns which she is agreeable to  Accordingly I will see her again in mid August.  At that time she will probably need to have her [employer] renewed and we will work with our pharmacy folks to get that accomplished  Total encounter time 25 minutes.*   Aleiah Mohammed, Virgie Dad, MD  11/10/20 12:58 PM Medical Oncology and Hematology [facility] [place], [place] [address] Tel. [phone]    Fax. [phone]   I, Wilburn Mylar, am acting as scribe for Dr. Virgie Dad. Suleiman Finigan.  I, Lurline Del MD, have reviewed the above documentation for accuracy and completeness, and I agree with the above.   *Total Encounter Time as defined by the Centers for Medicare and Medicaid Services includes, in addition to the face-to-face time of a patient visit (documented in the note above) non-face-to-face time: obtaining and reviewing outside history, ordering and reviewing medications, tests or procedures, care coordination (communications with other health care professionals or caregivers) and documentation in the medical record.

## 2020-11-10 ENCOUNTER — Inpatient Hospital Stay: Payer: Medicare Other

## 2020-11-10 ENCOUNTER — Other Ambulatory Visit: Payer: Self-pay

## 2020-11-10 ENCOUNTER — Inpatient Hospital Stay: Payer: Medicare Other | Attending: Oncology | Admitting: Oncology

## 2020-11-10 VITALS — BP 165/62 | HR 64 | Temp 97.5°F | Resp 18 | Ht 64.0 in | Wt 241.8 lb

## 2020-11-10 DIAGNOSIS — C7800 Secondary malignant neoplasm of unspecified lung: Secondary | ICD-10-CM

## 2020-11-10 DIAGNOSIS — Z79899 Other long term (current) drug therapy: Secondary | ICD-10-CM | POA: Insufficient documentation

## 2020-11-10 DIAGNOSIS — Z79811 Long term (current) use of aromatase inhibitors: Secondary | ICD-10-CM | POA: Diagnosis not present

## 2020-11-10 DIAGNOSIS — C50411 Malignant neoplasm of upper-outer quadrant of right female breast: Secondary | ICD-10-CM

## 2020-11-10 DIAGNOSIS — Z170 Estrogen receptor positive status [ER+]: Secondary | ICD-10-CM | POA: Insufficient documentation

## 2020-11-10 DIAGNOSIS — Z7982 Long term (current) use of aspirin: Secondary | ICD-10-CM | POA: Insufficient documentation

## 2020-11-10 DIAGNOSIS — E1136 Type 2 diabetes mellitus with diabetic cataract: Secondary | ICD-10-CM | POA: Diagnosis not present

## 2020-11-10 DIAGNOSIS — Z923 Personal history of irradiation: Secondary | ICD-10-CM | POA: Diagnosis not present

## 2020-11-10 DIAGNOSIS — Z7984 Long term (current) use of oral hypoglycemic drugs: Secondary | ICD-10-CM | POA: Diagnosis not present

## 2020-11-10 DIAGNOSIS — E785 Hyperlipidemia, unspecified: Secondary | ICD-10-CM | POA: Insufficient documentation

## 2020-11-10 DIAGNOSIS — E1151 Type 2 diabetes mellitus with diabetic peripheral angiopathy without gangrene: Secondary | ICD-10-CM | POA: Diagnosis not present

## 2020-11-10 DIAGNOSIS — Z86718 Personal history of other venous thrombosis and embolism: Secondary | ICD-10-CM | POA: Insufficient documentation

## 2020-11-10 LAB — CBC WITH DIFFERENTIAL/PLATELET
Abs Immature Granulocytes: 0.01 10*3/uL (ref 0.00–0.07)
Basophils Absolute: 0 10*3/uL (ref 0.0–0.1)
Basophils Relative: 1 %
Eosinophils Absolute: 0 10*3/uL (ref 0.0–0.5)
Eosinophils Relative: 1 %
HCT: 38.2 % (ref 36.0–46.0)
Hemoglobin: 12.9 g/dL (ref 12.0–15.0)
Immature Granulocytes: 0 %
Lymphocytes Relative: 32 %
Lymphs Abs: 0.8 10*3/uL (ref 0.7–4.0)
MCH: 31.9 pg (ref 26.0–34.0)
MCHC: 33.8 g/dL (ref 30.0–36.0)
MCV: 94.3 fL (ref 80.0–100.0)
Monocytes Absolute: 0.3 10*3/uL (ref 0.1–1.0)
Monocytes Relative: 11 %
Neutro Abs: 1.3 10*3/uL — ABNORMAL LOW (ref 1.7–7.7)
Neutrophils Relative %: 55 %
Platelets: 183 10*3/uL (ref 150–400)
RBC: 4.05 MIL/uL (ref 3.87–5.11)
RDW: 14.5 % (ref 11.5–15.5)
WBC: 2.4 10*3/uL — ABNORMAL LOW (ref 4.0–10.5)
nRBC: 0 % (ref 0.0–0.2)

## 2020-11-10 NOTE — Telephone Encounter (Signed)
Please refill as per office routine med refill policy (all routine meds refilled for 3 mo or monthly per pt preference up to one year from last visit, then month to month grace period for 3 mo, then further med refills will have to be denied)  

## 2020-11-20 DIAGNOSIS — H209 Unspecified iridocyclitis: Secondary | ICD-10-CM | POA: Diagnosis not present

## 2020-11-20 DIAGNOSIS — H401134 Primary open-angle glaucoma, bilateral, indeterminate stage: Secondary | ICD-10-CM | POA: Diagnosis not present

## 2020-11-20 DIAGNOSIS — E119 Type 2 diabetes mellitus without complications: Secondary | ICD-10-CM | POA: Diagnosis not present

## 2020-11-20 DIAGNOSIS — H04123 Dry eye syndrome of bilateral lacrimal glands: Secondary | ICD-10-CM | POA: Diagnosis not present

## 2020-11-20 DIAGNOSIS — Z961 Presence of intraocular lens: Secondary | ICD-10-CM | POA: Diagnosis not present

## 2020-12-21 ENCOUNTER — Telehealth: Payer: Self-pay | Admitting: Internal Medicine

## 2020-12-24 NOTE — Telephone Encounter (Signed)
Patient is requesting a refill

## 2020-12-24 NOTE — Telephone Encounter (Addendum)
Pt is requesting a rx refill for ACTOS. As stated on pt med list pt is to cone into the clinic and see provider for any further rx refills. . **I was able to schedule pt to come in to see Dr. Jenny Reichmann on 12/31/20 @ 11:20.  Pt did not answer the phone. Was not able to lvm as the phone just rang out.

## 2020-12-29 ENCOUNTER — Ambulatory Visit (INDEPENDENT_AMBULATORY_CARE_PROVIDER_SITE_OTHER): Payer: Medicare Other | Admitting: Internal Medicine

## 2020-12-29 ENCOUNTER — Other Ambulatory Visit: Payer: Self-pay

## 2020-12-29 ENCOUNTER — Encounter: Payer: Self-pay | Admitting: Internal Medicine

## 2020-12-29 VITALS — BP 146/78 | HR 68 | Temp 98.2°F | Ht 64.0 in | Wt 240.0 lb

## 2020-12-29 DIAGNOSIS — E782 Mixed hyperlipidemia: Secondary | ICD-10-CM

## 2020-12-29 DIAGNOSIS — I10 Essential (primary) hypertension: Secondary | ICD-10-CM | POA: Diagnosis not present

## 2020-12-29 DIAGNOSIS — E1165 Type 2 diabetes mellitus with hyperglycemia: Secondary | ICD-10-CM | POA: Diagnosis not present

## 2020-12-29 LAB — POCT GLYCOSYLATED HEMOGLOBIN (HGB A1C)
HbA1c POC (<> result, manual entry): 8.7 % (ref 4.0–5.6)
HbA1c, POC (controlled diabetic range): 8.7 % — AB (ref 0.0–7.0)
HbA1c, POC (prediabetic range): 8.7 % — AB (ref 5.7–6.4)
Hemoglobin A1C: 8.7 % — AB (ref 4.0–5.6)

## 2020-12-29 MED ORDER — LOVASTATIN 20 MG PO TABS
20.0000 mg | ORAL_TABLET | Freq: Every day | ORAL | 3 refills | Status: DC
Start: 2020-12-29 — End: 2021-06-09

## 2020-12-29 MED ORDER — LISINOPRIL 5 MG PO TABS
5.0000 mg | ORAL_TABLET | Freq: Every day | ORAL | 3 refills | Status: DC
Start: 2020-12-29 — End: 2021-06-09

## 2020-12-29 MED ORDER — PIOGLITAZONE HCL 15 MG PO TABS
15.0000 mg | ORAL_TABLET | Freq: Every day | ORAL | 3 refills | Status: DC
Start: 2020-12-29 — End: 2021-06-09

## 2020-12-29 NOTE — Assessment & Plan Note (Signed)
Lab Results  Component Value Date   HGBA1C 8.7 (A) 12/29/2020   HGBA1C 8.7 12/29/2020   HGBA1C 8.7 (A) 12/29/2020   HGBA1C 8.7 (A) 12/29/2020   Uncontrolled, pt for cont'd same tx as delcines med change, pt to continue current medical treatment  - actos 15

## 2020-12-29 NOTE — Progress Notes (Signed)
Patient ID: [name], female   DOB: 11-26-1945, 75 y.o.   MRN: [id number]        Chief Complaint: follow up HTN, HLD and hyperglycemia with her yearly visit as she flat refuses to f/u at least every 6 months as recommended       HPI:  [name] is a 75 y.o. female here overall tired of medical visits and testing, has been followed closely per oncology and will continue there; Pt denies chest pain, increased sob or doe, wheezing, orthopnea, PND, increased LE swelling, palpitations, dizziness or syncope.   Pt denies polydipsia, polyuria, or new focal neuro s/s.   Pt denies fever, night sweats, loss of appetite, or other constitutional symptoms.  Pt adamant BP at hone < 140/90 and refuses any med change today.  Not concerned about her overall sugar control enough for routine A1c checks, though ok for check today         Wt Readings from Last 3 Encounters:  12/29/20 240 lb (108.9 kg)  11/10/20 241 lb 12.8 oz (109.7 kg)  09/21/20 244 lb 1.6 oz (110.7 kg)   BP Readings from Last 3 Encounters:  12/29/20 (!) 146/78  11/10/20 (!) 165/62  09/21/20 (!) 158/60         Past Medical History:  Diagnosis Date   Breast cancer ([place])    Cancer ([place]) 02/2016   right breast   DIABETES MELLITUS, TYPE II 01/04/2007   only takes actoplus daily   Dizziness and giddiness 02/29/2008   DVT, HX OF    at age 88 in right buttocks   Dyspnea    due to lung cancer   Family history of breast cancer    GERD 01/04/2007   pt reports resolved    GLAUCOMA 07/30/2008   both eyes   History of blood transfusion    no abnormal  reaction   History of uterine cancer 2000   hysterectomy done   HYPERLIPIDEMIA 01/04/2007   taking Pravastatin daily   HYPERTENSION 01/04/2007   takes Lisinopril daily   Joint pain    Joint swelling    Leg cramps    LEG PAIN, LEFT 07/06/2007   NUMBNESS 07/30/2008   in fingers;pt states from [facility]   OSTEOARTHRITIS, HIP 09/25/2009   OTITIS MEDIA, ACUTE, BILATERAL 02/29/2008    Overweight(278.02) 01/04/2007   Peripheral vascular disease ([place])    Personal history of radiation therapy 2018   Pneumonia    PONV (postoperative nausea and vomiting)    SLEEP APNEA, OBSTRUCTIVE    doesn't use a cpap;study done about 14yrs ago   [place], HX OF 01/04/2007   Vision loss    left eye   Past Surgical History:  Procedure Laterality Date   ABDOMINAL HYSTERECTOMY  2000   BREAST LUMPECTOMY Right 01/13/2017   x2   BREAST LUMPECTOMY WITH RADIOACTIVE SEED AND SENTINEL LYMPH NODE BIOPSY Right 01/13/2017   Procedure: RIGHT BREAST RADIOACTIVE SEED X'S 2 GUIDED LUMPECTOMY WITH RADIOACTIVE SEED TARGETED AXILLARYLYMPH NODE EXCISION AND RIGHT AXILLARY SENTINEL LYMPH NODE BIOPSY;  Surgeon: Alphonsa Overall, MD;  Location: [facility];  Service: General;  Laterality: Right;  2 SEEDS IN RIGHT BREAST 1 SEED IN RIGHT AXILLARY NODE   CHOLECYSTECTOMY     ENDOBRONCHIAL ULTRASOUND Bilateral 03/28/2016   Procedure: ENDOBRONCHIAL ULTRASOUND;  Surgeon: Collene Gobble, MD;  Location: [facility];  Service: Cardiopulmonary;  Laterality: Bilateral;   EYE SURGERY  13   shunt left and lazer eye surgery on right  cataract and retenia tear with repair   growth removal  2004   from thumb   KNEE ARTHROSCOPY Right    mulitple eye surgeries     both eyes, cataracts with ioc done both eyes   OOPHORECTOMY     right lumpectomy with axillary node dissection Right 01/2017   TOTAL HIP ARTHROPLASTY  06/24/2011   Procedure: TOTAL HIP ARTHROPLASTY;  Surgeon: Kerin Salen;  Location: [facility];  Service: Orthopedics;  Laterality: Right;   TOTAL HIP ARTHROPLASTY Left 11/12/2012   Dr Mayer Camel   TOTAL HIP ARTHROPLASTY Left 11/12/2012   Procedure: TOTAL HIP ARTHROPLASTY;  Surgeon: Kerin Salen, MD;  Location: [facility];  Service: Orthopedics;  Laterality: Left;  DEPUY PINNACLE    reports that she has never smoked. She has never used smokeless tobacco. She reports that she does not drink alcohol and does not use  drugs. family history includes Breast cancer (age of onset: 59) in her sister; Cancer in her mother; Dementia in her mother; Glaucoma in her maternal grandfather; Heart attack in her maternal aunt; Lung cancer in her maternal grandmother; Stroke in her sister. Allergies  Allergen Reactions   Codeine Hives    Hycodan syrup   Fluorescein Nausea And Vomiting    ? IV dye for retina specialist   Lipitor [Atorvastatin Calcium]     Leg cramp   Oxycodone Nausea And Vomiting    Patient vomited for 3 days after taking   Sitagliptin Phosphate Nausea And Vomiting   Sulfa Drugs Cross Reactors Nausea And Vomiting   Current Outpatient Medications on File Prior to Visit  Medication Sig Dispense Refill   acetaminophen (TYLENOL) 500 MG tablet Take 1,000 mg by mouth every 4 (four) hours as needed for moderate pain or fever.     aspirin EC 81 MG tablet Take 81 mg by mouth daily at 6 PM. 1700     Biotin 1 MG CAPS Take by mouth.     brimonidine (ALPHAGAN) 0.2 % ophthalmic solution INSTILL 1 DROP INTO EACH EYE THREE TIMES DAILY     brimonidine-timolol (COMBIGAN) 0.2-0.5 % ophthalmic solution Place 1 drop into both eyes three times daily     cholecalciferol (VITAMIN D) 1000 units tablet Take 1,000 Units by mouth daily.     dorzolamide-timolol (COSOPT) 22.3-6.8 MG/ML ophthalmic solution Place 1 drop into both eyes 2 (two) times daily.     letrozole (FEMARA) 2.5 MG tablet TAKE 1 TABLET BY MOUTH AT BEDTIME. 90 tablet 4   Netarsudil-Latanoprost (ROCKLATAN) 0.02-0.005 % SOLN Apply to eye.     palbociclib (IBRANCE) 75 MG tablet TAKE 1 TABLET (75 MG TOTAL) BY MOUTH DAILY. TAKE FOR 21 DAYS ON, 7 DAYS OFF, REPEAT EVERY 28 DAYS. 21 tablet 6   prednisoLONE acetate (PRED FORTE) 1 % ophthalmic suspension SMARTSIG:1 In Eye(s) 6 Times Daily     No current facility-administered medications on file prior to visit.        ROS:  All others reviewed and negative.  Objective        PE:  BP (!) 146/78 (BP Location: Left Arm,  Patient Position: Sitting, Cuff Size: Normal)   Pulse 68   Temp 98.2 F (36.8 C) (Oral)   Ht 5\' 4"  (1.626 m)   Wt 240 lb (108.9 kg)   SpO2 98%   BMI 41.20 kg/m                 Constitutional: Pt appears in NAD  HENT: Head: NCAT.                Right Ear: External ear normal.                 Left Ear: External ear normal.                Eyes: . Pupils are equal, round, and reactive to light. Conjunctivae and EOM are normal               Nose: without d/c or deformity               Neck: Neck supple. Gross normal ROM               Cardiovascular: Normal rate and regular rhythm.                 Pulmonary/Chest: Effort normal and breath sounds without rales or wheezing.                Abd:  Soft, NT, ND, + BS, no organomegaly               Neurological: Pt is alert. At baseline orientation, motor grossly intact               Skin: Skin is warm. No rashes, no other new lesions, LE edema - none               Psychiatric: Pt behavior is normal without agitation   Micro: none  Cardiac tracings I have personally interpreted today:  none  Pertinent Radiological findings (summarize): none   Lab Results  Component Value Date   WBC 2.4 (L) 11/10/2020   HGB 12.9 11/10/2020   HCT 38.2 11/10/2020   PLT 183 11/10/2020   GLUCOSE 230 (H) 09/18/2020   CHOL 195 04/29/2015   TRIG 129.0 04/29/2015   HDL 52.40 04/29/2015   LDLCALC 117 (H) 04/29/2015   ALT 15 09/18/2020   AST 12 (L) 09/18/2020   NA 141 09/18/2020   K 4.4 09/18/2020   CL 104 09/18/2020   CREATININE 1.06 (H) 09/18/2020   BUN 18 09/18/2020   CO2 24 09/18/2020   TSH 1.172 09/24/2018   INR 1.06 11/08/2012   HGBA1C 8.7 (A) 12/29/2020   HGBA1C 8.7 12/29/2020   HGBA1C 8.7 (A) 12/29/2020   HGBA1C 8.7 (A) 12/29/2020   MICROALBUR <0.7 10/30/2014   POCT glycosylated hemoglobin (Hb A1C) Order: [id number] Status: Final result   Visible to patient: No (scheduled for 12/29/2020  5:12 PM)   Dx: Type 2 diabetes mellitus  with hypergl...   0 Result Notes   1 HM Topic  Component Ref Range & Units 16:11  (12/29/20) 9 mo ago  (03/17/20) 2 yr ago  (09/04/18) 3 yr ago  (07/20/17) 3 yr ago  (01/04/17) 5 yr ago  (04/29/15) 6 yr ago  (10/30/14)  Hemoglobin A1C 4.0 - 5.6 % 8.7 Abnormal   6.7 Abnormal   6.7 Abnormal   6.7 R  6.7 High  R, CM  7.3 High  R, CM  6.9 High  R, CM       Assessment/Plan:  [name] is a 75 y.o. White or Caucasian [1] female with  has a past medical history of Breast cancer ([place]), Cancer ([place]) (02/2016), DIABETES MELLITUS, TYPE II (01/04/2007), Dizziness and giddiness (02/29/2008), DVT, HX OF, Dyspnea, Family history of breast cancer, GERD (01/04/2007), GLAUCOMA (07/30/2008), History of blood transfusion, History of uterine cancer (2000), HYPERLIPIDEMIA (  01/04/2007), HYPERTENSION (01/04/2007), Joint pain, Joint swelling, Leg cramps, LEG PAIN, LEFT (07/06/2007), NUMBNESS (07/30/2008), OSTEOARTHRITIS, HIP (09/25/2009), OTITIS MEDIA, ACUTE, BILATERAL (02/29/2008), Overweight(278.02) (01/04/2007), Peripheral vascular disease ([place]), Personal history of radiation therapy (2018), Pneumonia, PONV (postoperative nausea and vomiting), SLEEP APNEA, OBSTRUCTIVE, TRANSIENT ISCHEMIC ATTACK, HX OF (01/04/2007), and Vision loss.  Diabetes Lab Results  Component Value Date   HGBA1C 8.7 (A) 12/29/2020   HGBA1C 8.7 12/29/2020   HGBA1C 8.7 (A) 12/29/2020   HGBA1C 8.7 (A) 12/29/2020   Uncontrolled, pt for cont'd same tx as delcines med change, pt to continue current medical treatment  - actos 15   Essential hypertension BP Readings from Last 3 Encounters:  12/29/20 (!) 146/78  11/10/20 (!) 165/62  09/21/20 (!) 158/60   Uncontrolled,, pt to continue medical treatment as declines any change - lisinopril 5   Hyperlipidemia . Lab Results  Component Value Date   LDLCALC 117 (H) 04/29/2015   Last LDL uncontrolled, goal ldl < 70 pt to continue current statin lovsatatin 20 as declines change  Followup: Return in  about 1 year (around 12/29/2021).  Cathlean Cower, MD 12/29/2020 10:00 PM [facility]

## 2020-12-29 NOTE — Patient Instructions (Signed)
Please continue all other medications as before, and refills have been done if requested.  Please have the pharmacy call with any other refills you may need.  Please continue your efforts at being more active, low cholesterol diet, and weight control.  You are otherwise up to date with prevention measures today.  Please keep your appointments with your specialists as you may have planned  Please make an Appointment to return for your 1 year visit, or sooner if needed

## 2020-12-29 NOTE — Assessment & Plan Note (Signed)
BP Readings from Last 3 Encounters:  12/29/20 (!) 146/78  11/10/20 (!) 165/62  09/21/20 (!) 158/60   Uncontrolled,, pt to continue medical treatment as declines any change - lisinopril 5

## 2020-12-29 NOTE — Assessment & Plan Note (Signed)
.   Lab Results  Component Value Date   LDLCALC 117 (H) 04/29/2015   Last LDL uncontrolled, goal ldl < 70 pt to continue current statin lovsatatin 20 as declines change

## 2020-12-31 ENCOUNTER — Ambulatory Visit: Payer: Medicare Other | Admitting: Internal Medicine

## 2021-01-18 ENCOUNTER — Other Ambulatory Visit (HOSPITAL_COMMUNITY): Payer: Self-pay

## 2021-01-18 MED FILL — Letrozole Tab 2.5 MG: ORAL | 90 days supply | Qty: 90 | Fill #1 | Status: AC

## 2021-01-21 DIAGNOSIS — H2013 Chronic iridocyclitis, bilateral: Secondary | ICD-10-CM | POA: Diagnosis not present

## 2021-01-21 DIAGNOSIS — H353132 Nonexudative age-related macular degeneration, bilateral, intermediate dry stage: Secondary | ICD-10-CM | POA: Diagnosis not present

## 2021-01-21 DIAGNOSIS — H35351 Cystoid macular degeneration, right eye: Secondary | ICD-10-CM | POA: Diagnosis not present

## 2021-01-21 DIAGNOSIS — E119 Type 2 diabetes mellitus without complications: Secondary | ICD-10-CM | POA: Diagnosis not present

## 2021-01-26 ENCOUNTER — Inpatient Hospital Stay (HOSPITAL_BASED_OUTPATIENT_CLINIC_OR_DEPARTMENT_OTHER): Payer: Medicare Other | Admitting: Oncology

## 2021-01-26 ENCOUNTER — Inpatient Hospital Stay: Payer: Medicare Other | Attending: Oncology

## 2021-01-26 ENCOUNTER — Other Ambulatory Visit: Payer: Self-pay

## 2021-01-26 ENCOUNTER — Other Ambulatory Visit (HOSPITAL_COMMUNITY): Payer: Self-pay

## 2021-01-26 VITALS — BP 161/90 | HR 89 | Temp 97.4°F | Resp 18 | Ht 64.0 in | Wt 241.9 lb

## 2021-01-26 DIAGNOSIS — E785 Hyperlipidemia, unspecified: Secondary | ICD-10-CM | POA: Insufficient documentation

## 2021-01-26 DIAGNOSIS — Z7189 Other specified counseling: Secondary | ICD-10-CM

## 2021-01-26 DIAGNOSIS — Z9049 Acquired absence of other specified parts of digestive tract: Secondary | ICD-10-CM | POA: Insufficient documentation

## 2021-01-26 DIAGNOSIS — C7800 Secondary malignant neoplasm of unspecified lung: Secondary | ICD-10-CM

## 2021-01-26 DIAGNOSIS — Z86718 Personal history of other venous thrombosis and embolism: Secondary | ICD-10-CM | POA: Insufficient documentation

## 2021-01-26 DIAGNOSIS — G4733 Obstructive sleep apnea (adult) (pediatric): Secondary | ICD-10-CM | POA: Insufficient documentation

## 2021-01-26 DIAGNOSIS — E119 Type 2 diabetes mellitus without complications: Secondary | ICD-10-CM | POA: Insufficient documentation

## 2021-01-26 DIAGNOSIS — Z882 Allergy status to sulfonamides status: Secondary | ICD-10-CM | POA: Insufficient documentation

## 2021-01-26 DIAGNOSIS — Z79811 Long term (current) use of aromatase inhibitors: Secondary | ICD-10-CM | POA: Diagnosis not present

## 2021-01-26 DIAGNOSIS — Z170 Estrogen receptor positive status [ER+]: Secondary | ICD-10-CM

## 2021-01-26 DIAGNOSIS — G620 Drug-induced polyneuropathy: Secondary | ICD-10-CM

## 2021-01-26 DIAGNOSIS — Z6841 Body Mass Index (BMI) 40.0 and over, adult: Secondary | ICD-10-CM | POA: Diagnosis not present

## 2021-01-26 DIAGNOSIS — Z83511 Family history of glaucoma: Secondary | ICD-10-CM | POA: Insufficient documentation

## 2021-01-26 DIAGNOSIS — Z8673 Personal history of transient ischemic attack (TIA), and cerebral infarction without residual deficits: Secondary | ICD-10-CM | POA: Diagnosis not present

## 2021-01-26 DIAGNOSIS — Z803 Family history of malignant neoplasm of breast: Secondary | ICD-10-CM | POA: Diagnosis not present

## 2021-01-26 DIAGNOSIS — Z923 Personal history of irradiation: Secondary | ICD-10-CM | POA: Diagnosis not present

## 2021-01-26 DIAGNOSIS — Z79899 Other long term (current) drug therapy: Secondary | ICD-10-CM | POA: Insufficient documentation

## 2021-01-26 DIAGNOSIS — C50411 Malignant neoplasm of upper-outer quadrant of right female breast: Secondary | ICD-10-CM | POA: Insufficient documentation

## 2021-01-26 DIAGNOSIS — Z818 Family history of other mental and behavioral disorders: Secondary | ICD-10-CM | POA: Insufficient documentation

## 2021-01-26 DIAGNOSIS — T451X5A Adverse effect of antineoplastic and immunosuppressive drugs, initial encounter: Secondary | ICD-10-CM

## 2021-01-26 DIAGNOSIS — Z801 Family history of malignant neoplasm of trachea, bronchus and lung: Secondary | ICD-10-CM | POA: Diagnosis not present

## 2021-01-26 DIAGNOSIS — Z823 Family history of stroke: Secondary | ICD-10-CM | POA: Insufficient documentation

## 2021-01-26 DIAGNOSIS — Z885 Allergy status to narcotic agent status: Secondary | ICD-10-CM | POA: Insufficient documentation

## 2021-01-26 DIAGNOSIS — Z8249 Family history of ischemic heart disease and other diseases of the circulatory system: Secondary | ICD-10-CM | POA: Insufficient documentation

## 2021-01-26 DIAGNOSIS — I10 Essential (primary) hypertension: Secondary | ICD-10-CM | POA: Diagnosis not present

## 2021-01-26 DIAGNOSIS — Z90721 Acquired absence of ovaries, unilateral: Secondary | ICD-10-CM | POA: Diagnosis not present

## 2021-01-26 DIAGNOSIS — N6489 Other specified disorders of breast: Secondary | ICD-10-CM | POA: Diagnosis not present

## 2021-01-26 LAB — COMPREHENSIVE METABOLIC PANEL
ALT: 14 U/L (ref 0–44)
AST: 11 U/L — ABNORMAL LOW (ref 15–41)
Albumin: 3.9 g/dL (ref 3.5–5.0)
Alkaline Phosphatase: 79 U/L (ref 38–126)
Anion gap: 7 (ref 5–15)
BUN: 18 mg/dL (ref 8–23)
CO2: 25 mmol/L (ref 22–32)
Calcium: 9.2 mg/dL (ref 8.9–10.3)
Chloride: 104 mmol/L (ref 98–111)
Creatinine, Ser: 0.89 mg/dL (ref 0.44–1.00)
GFR, Estimated: >60 mL/min (ref >60)
Glucose, Bld: 260 mg/dL — ABNORMAL HIGH (ref 70–99)
Potassium: 4.3 mmol/L (ref 3.5–5.1)
Sodium: 136 mmol/L (ref 135–145)
Total Bilirubin: 0.4 mg/dL (ref 0.3–1.2)
Total Protein: 6.9 g/dL (ref 6.5–8.1)

## 2021-01-26 LAB — CBC WITH DIFFERENTIAL/PLATELET
Abs Immature Granulocytes: 0 10*3/uL (ref 0.00–0.07)
Basophils Absolute: 0 10*3/uL (ref 0.0–0.1)
Basophils Relative: 1 %
Eosinophils Absolute: 0.1 10*3/uL (ref 0.0–0.5)
Eosinophils Relative: 3 %
HCT: 36.4 % (ref 36.0–46.0)
Hemoglobin: 12.5 g/dL (ref 12.0–15.0)
Immature Granulocytes: 0 %
Lymphocytes Relative: 31 %
Lymphs Abs: 0.6 10*3/uL — ABNORMAL LOW (ref 0.7–4.0)
MCH: 32.6 pg (ref 26.0–34.0)
MCHC: 34.3 g/dL (ref 30.0–36.0)
MCV: 94.8 fL (ref 80.0–100.0)
Monocytes Absolute: 0.2 10*3/uL (ref 0.1–1.0)
Monocytes Relative: 9 %
Neutro Abs: 1.2 10*3/uL — ABNORMAL LOW (ref 1.7–7.7)
Neutrophils Relative %: 56 %
Platelets: 266 10*3/uL (ref 150–400)
RBC: 3.84 MIL/uL — ABNORMAL LOW (ref 3.87–5.11)
RDW: 14.6 % (ref 11.5–15.5)
WBC: 2.1 10*3/uL — ABNORMAL LOW (ref 4.0–10.5)
nRBC: 0 % (ref 0.0–0.2)

## 2021-01-26 MED ORDER — PALBOCICLIB 75 MG PO TABS: ORAL_TABLET | ORAL | 6 refills | Status: DC

## 2021-01-26 MED ORDER — PALBOCICLIB 75 MG PO TABS
ORAL_TABLET | ORAL | 6 refills | Status: DC
  Filled 2021-01-26: qty 21, fill #0

## 2021-01-26 MED ORDER — LETROZOLE 2.5 MG PO TABS: ORAL_TABLET | Freq: Every day | ORAL | 4 refills | Status: AC

## 2021-01-26 NOTE — Progress Notes (Signed)
Oral Oncology Pharmacist Encounter  Prescription refill for palbociclib [name]) sent to [facility] in error. Patient enrolled in manufacturer assistance and receives medication through [facility]. Prescription redirected to [facility].  Drema Halon, PharmD Hematology/Oncology Clinical Pharmacist [facility] [phone]

## 2021-01-26 NOTE — Progress Notes (Signed)
[facility]  [phone] [phone]     ID: Shannon MCCLEESE DOB: [birth year]  MR#: [id number]  AFB#:[id number]  Patient Care Team: Biagio Borg, MD as PCP - General (Internal Medicine) Alphonsa Overall, MD as Consulting Physician (General Surgery) Izaia Say, Virgie Dad, MD as Consulting Physician (Oncology) Kyung Rudd, MD as Consulting Physician (Radiation Oncology) Bobbye Charleston, MD as Consulting Physician (Obstetrics and Gynecology) Nada Libman, MD as Referring Physician (Specialist) Collene Gobble, MD as Consulting Physician (Pulmonary Disease) Alexis Frock, MD as Consulting Physician (Urology) Ander Slade, Carlisle Beers, MD as Referring Physician (Ophthalmology) Raina Mina, RPH-CPP (Pharmacist) OTHER MD:   CHIEF COMPLAINT: Estrogen receptor positive breast cancer  CURRENT TREATMENT: Letrozole, palbociclib   INTERVAL HISTORY: [name] returns today for follow-up of her estrogen receptor positive stage IV breast cancer.  She is accompanied by her husband [name].  Currently we are seeing her on an every 21-month basis with labs and visit and considering scans every 6 to 12 months.  Since her last visit, she underwent bilateral diagnostic mammography with tomography at [facility] on 10/19/2020 showing: breast density category C; no evidence of malignancy in either breast.   She continues on letrozole.  Aside from hair thinning she reports no symptoms or complications related to this medication.  She does very well with the palbociclib also.  She is currently on 75 mg daily, 21 days on 7 days off.  She has a good relationship with our oral chemotherapy pharmacy agents and is able to contact them directly if she has questions.  We are continuing to follow her tumor marker: This has continued in the normal range, with a repeat due today.  Her last chest CT scan was 09/21/2020 and showed no measurable disease.   REVIEW OF SYSTEMS: [name]  feels very fatigued all the time.  Sometimes she just does not feel she can go anymore.  When that happens she just goes to bed or lies down or sits down for a while.  She is trying to walk for exercise.  She tells me she cannot swim because she has had LASIK surgery.  She despair is of losing weight.  She is already on a good diet she feels.  A detailed review of systems was otherwise stable.   COVID 19 VACCINATION STATUS: Status post Pfizer x2 followed by booster August 2021   BREAST CANCER HISTORY: From the original intake note:  [name] had screening mammography showing some suspicious calcifications in the right breast leading to right diagnostic mammography with ultrasonography 01/22/2016 at [facility]. The breast density was category C. In the upper right breast there was a 2.3 cm mass with additional masses measuring 0.9 and 0.7 cm. There was also a possible additional 0.8 mass in the lower inner quadrant. Ultrasound confirmed an irregular hypoechoic mass in the right breast upper outer quadrant measuring 2.0 cm. There were other masses measuring 0.7 and 0.8 cm by ultrasonography. The right axilla was sonographically benign.  Biopsy of a 12:00 and 4:00 mass in the right breast 01/22/2016 showed ([id number]) both specimens showing invasive ductal carcinoma, grade 1 or 2, both 95% estrogen receptor positive, both 95% progesterone receptor positive, both with strong staining intensity, with MIB-1 ranging from 10-15%, and both HER-2 negative, the signals ratio being 1.23-1.42, and the number per cell 1.85-2.59.  Her subsequent history is as detailed below   PAST MEDICAL HISTORY: Past Medical History:  Diagnosis Date   Breast cancer ([place])  Cancer ([place]) 02/2016   right breast   DIABETES MELLITUS, TYPE II 01/04/2007   only takes actoplus daily   Dizziness and giddiness 02/29/2008   DVT, HX OF    at age 50 in right buttocks   Dyspnea    due to lung cancer   Family history of breast cancer     GERD 01/04/2007   pt reports resolved    GLAUCOMA 07/30/2008   both eyes   History of blood transfusion    no abnormal  reaction   History of uterine cancer 2000   hysterectomy done   HYPERLIPIDEMIA 01/04/2007   taking Pravastatin daily   HYPERTENSION 01/04/2007   takes Lisinopril daily   Joint pain    Joint swelling    Leg cramps    LEG PAIN, LEFT 07/06/2007   NUMBNESS 07/30/2008   in fingers;pt states from Di[facility]   OSTEOARTHRITIS, HIP 09/25/2009   OTITIS MEDIA, ACUTE, BILATERAL 02/29/2008   Overweight(278.02) 01/04/2007   Peripheral vascular disease ([place])    Personal history of radiation therapy 2018   Pneumonia    PONV (postoperative nausea and vomiting)    SLEEP APNEA, OBSTRUCTIVE    doesn't use a cpap;study done about 75yrs ago   [place], HX OF 01/04/2007   Vision loss    left eye    PAST SURGICAL HISTORY: Past Surgical History:  Procedure Laterality Date   ABDOMINAL HYSTERECTOMY  2000   BREAST LUMPECTOMY Right 01/13/2017   x2   BREAST LUMPECTOMY WITH RADIOACTIVE SEED AND SENTINEL LYMPH NODE BIOPSY Right 01/13/2017   Procedure: RIGHT BREAST RADIOACTIVE SEED X'S 2 GUIDED LUMPECTOMY WITH RADIOACTIVE SEED TARGETED AXILLARYLYMPH NODE EXCISION AND RIGHT AXILLARY SENTINEL LYMPH NODE BIOPSY;  Surgeon: Alphonsa Overall, MD;  Location: [facility];  Service: General;  Laterality: Right;  2 SEEDS IN RIGHT BREAST 1 SEED IN RIGHT AXILLARY NODE   CHOLECYSTECTOMY     ENDOBRONCHIAL ULTRASOUND Bilateral 03/28/2016   Procedure: ENDOBRONCHIAL ULTRASOUND;  Surgeon: Collene Gobble, MD;  Location: [facility];  Service: Cardiopulmonary;  Laterality: Bilateral;   EYE SURGERY  13   shunt left and lazer eye surgery on right cataract and retenia tear with repair   growth removal  2004   from thumb   KNEE ARTHROSCOPY Right    mulitple eye surgeries     both eyes, cataracts with ioc done both eyes   OOPHORECTOMY     right lumpectomy with axillary node dissection Right 01/2017   TOTAL  HIP ARTHROPLASTY  06/24/2011   Procedure: TOTAL HIP ARTHROPLASTY;  Surgeon: Kerin Salen;  Location: [facility];  Service: Orthopedics;  Laterality: Right;   TOTAL HIP ARTHROPLASTY Left 11/12/2012   Dr Mayer Camel   TOTAL HIP ARTHROPLASTY Left 11/12/2012   Procedure: TOTAL HIP ARTHROPLASTY;  Surgeon: Kerin Salen, MD;  Location: [facility];  Service: Orthopedics;  Laterality: Left;  DEPUY PINNACLE    FAMILY HISTORY Family History  Problem Relation Age of Onset   Dementia Mother    Cancer Mother        Breast and lung cancer   Stroke Sister    Breast cancer Sister 72   Heart attack Maternal Aunt    Lung cancer Maternal Grandmother        non smoker   Glaucoma Maternal Grandfather    Anesthesia problems Neg Hx   The patient's father died at age 61, the patient's mother died at age 34. She had breast and lung cancers diagnosed shortly before her death.  The patient had no brothers, 2 sisters. One sister was diagnosed with breast cancer at the age of 32.   GYNECOLOGIC HISTORY:  No LMP recorded. Patient has had a hysterectomy. Menarche age 63, first live birth age 33, the patient is GX P1. She had a hysterectomy for endometrial cancer in the year 2000. She did not take hormone replacement. She did use oral contraceptives for more than 20 years remotely, with no complications.   SOCIAL HISTORY: (Updated July 2021). 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 She is home with her husband [name]. He is a retired Dealer.Their son [name] also lives in [place].  The patient has 3 grandchildren aged 49, 37 and 82    ADVANCED DIRECTIVES: In place   HEALTH MAINTENANCE: Social History   Tobacco Use   Smoking status: Never   Smokeless tobacco: Never  Vaping Use   Vaping Use: Never used  Substance Use Topics   Alcohol use: No   Drug use: No     Colonoscopy: Never  PAP: Status post hysterectomy  Bone density: Remote   Allergies   Allergen Reactions   Codeine Hives    Hycodan syrup   Fluorescein Nausea And Vomiting    ? IV dye for retina specialist   Lipitor [Atorvastatin Calcium]     Leg cramp   Oxycodone Nausea And Vomiting    Patient vomited for 3 days after taking   Sitagliptin Phosphate Nausea And Vomiting   Sulfa Drugs Cross Reactors Nausea And Vomiting    Current Outpatient Medications  Medication Sig Dispense Refill   acetaminophen (TYLENOL) 500 MG tablet Take 1,000 mg by mouth every 4 (four) hours as needed for moderate pain or fever.     aspirin EC 81 MG tablet Take 81 mg by mouth daily at 6 PM. 1700     Biotin 1 MG CAPS Take by mouth.     brimonidine (ALPHAGAN) 0.2 % ophthalmic solution INSTILL 1 DROP INTO EACH EYE THREE TIMES DAILY     brimonidine-timolol (COMBIGAN) 0.2-0.5 % ophthalmic solution Place 1 drop into both eyes three times daily     cholecalciferol (VITAMIN D) 1000 units tablet Take 1,000 Units by mouth daily.     dorzolamide-timolol (COSOPT) 22.3-6.8 MG/ML ophthalmic solution Place 1 drop into both eyes 2 (two) times daily.     letrozole (FEMARA) 2.5 MG tablet TAKE 1 TABLET BY MOUTH AT BEDTIME. 90 tablet 4   lisinopril (ZESTRIL) 5 MG tablet Take 1 tablet (5 mg total) by mouth daily. 90 tablet 3   lovastatin (MEVACOR) 20 MG tablet Take 1 tablet (20 mg total) by mouth at bedtime. 90 tablet 3   Netarsudil-Latanoprost (ROCKLATAN) 0.02-0.005 % SOLN Apply to eye.     palbociclib (IBRANCE) 75 MG tablet TAKE 1 TABLET (75 MG TOTAL) BY MOUTH DAILY. TAKE FOR 21 DAYS ON, 7 DAYS OFF, REPEAT EVERY 28 DAYS. 21 tablet 6   pioglitazone (ACTOS) 15 MG tablet Take 1 tablet (15 mg total) by mouth daily. 90 tablet 3   prednisoLONE acetate (PRED FORTE) 1 % ophthalmic suspension SMARTSIG:1 In Eye(s) 6 Times Daily     No current facility-administered medications for this visit.     OBJECTIVE: white woman who appears stated age  57:   01/26/21 1215  BP: (!) 161/90  Pulse: 89  Resp: 18  Temp: (!)  97.4 F (36.3 C)  SpO2: 96%    Wt Readings from  Last 3 Encounters:  01/26/21 241 lb 14.4 oz (109.7 kg)  12/29/20 240 lb (108.9 kg)  11/10/20 241 lb 12.8 oz (109.7 kg)   Body mass index is 41.52 kg/m.    ECOG FS:1 - Symptomatic but completely ambulatory  Sclerae unicteric, EOMs intact Wearing a mask No cervical or supraclavicular adenopathy Lungs no rales or rhonchi Heart regular rate and rhythm Abd soft, obese, nontender, positive bowel sounds MSK no focal spinal tenderness, no right upper extremity lymphedema Neuro: nonfocal, well oriented, frustrated affect Breasts: The right breast is status post lumpectomies and radiation.  There is no evidence of local recurrence.  The left breast and both axillae are benign   LAB RESULTS:  CMP     Component Value Date/Time   NA 136 01/26/2021 1155   NA 140 05/29/2017 1254   K 4.3 01/26/2021 1155   K 4.4 05/29/2017 1254   CL 104 01/26/2021 1155   CO2 25 01/26/2021 1155   CO2 25 05/29/2017 1254   GLUCOSE 260 (H) 01/26/2021 1155   GLUCOSE 154 (H) 05/29/2017 1254   BUN 18 01/26/2021 1155   BUN 11.5 05/29/2017 1254   CREATININE 0.89 01/26/2021 1155   CREATININE 0.77 06/30/2020 1030   CREATININE 0.8 05/29/2017 1254   CALCIUM 9.2 01/26/2021 1155   CALCIUM 9.3 05/29/2017 1254   PROT 6.9 01/26/2021 1155   PROT 7.0 05/29/2017 1254   ALBUMIN 3.9 01/26/2021 1155   ALBUMIN 4.1 05/29/2017 1254   AST 11 (L) 01/26/2021 1155   AST 17 06/30/2020 1030   AST 13 05/29/2017 1254   ALT 14 01/26/2021 1155   ALT 14 06/30/2020 1030   ALT 14 05/29/2017 1254   ALKPHOS 79 01/26/2021 1155   ALKPHOS 66 05/29/2017 1254   BILITOT 0.4 01/26/2021 1155   BILITOT 0.5 06/30/2020 1030   BILITOT 0.50 05/29/2017 1254   GFRNONAA >60 01/26/2021 1155   GFRNONAA >60 06/30/2020 1030   GFRAA >60 03/10/2020 1138    INo results found for: SPEP, UPEP  Lab Results  Component Value Date   WBC 2.1 (L) 01/26/2021   NEUTROABS 1.2 (L) 01/26/2021   HGB 12.5  01/26/2021   HCT 36.4 01/26/2021   MCV 94.8 01/26/2021   PLT 266 01/26/2021      Chemistry      Component Value Date/Time   NA 136 01/26/2021 1155   NA 140 05/29/2017 1254   K 4.3 01/26/2021 1155   K 4.4 05/29/2017 1254   CL 104 01/26/2021 1155   CO2 25 01/26/2021 1155   CO2 25 05/29/2017 1254   BUN 18 01/26/2021 1155   BUN 11.5 05/29/2017 1254   CREATININE 0.89 01/26/2021 1155   CREATININE 0.77 06/30/2020 1030   CREATININE 0.8 05/29/2017 1254      Component Value Date/Time   CALCIUM 9.2 01/26/2021 1155   CALCIUM 9.3 05/29/2017 1254   ALKPHOS 79 01/26/2021 1155   ALKPHOS 66 05/29/2017 1254   AST 11 (L) 01/26/2021 1155   AST 17 06/30/2020 1030   AST 13 05/29/2017 1254   ALT 14 01/26/2021 1155   ALT 14 06/30/2020 1030   ALT 14 05/29/2017 1254   BILITOT 0.4 01/26/2021 1155   BILITOT 0.5 06/30/2020 1030   BILITOT 0.50 05/29/2017 1254       No results found for: LABCA2  No components found for: LABCA125  No results for input(s): INR in the last 168 hours.  Urinalysis    Component Value Date/Time   COLORURINE YELLOW 08/18/2017 1322  APPEARANCEUR HAZY (A) 08/18/2017 1322   LABSPEC 1.010 08/18/2017 1322   PHURINE 7.0 08/18/2017 1322   GLUCOSEU NEGATIVE 08/18/2017 1322   GLUCOSEU NEGATIVE 10/30/2014 1513   HGBUR LARGE (A) 08/18/2017 1322   BILIRUBINUR NEGATIVE 08/18/2017 1322   KETONESUR NEGATIVE 08/18/2017 1322   PROTEINUR 30 (A) 08/18/2017 1322   UROBILINOGEN 0.2 10/30/2014 1513   NITRITE NEGATIVE 08/18/2017 1322   LEUKOCYTESUR LARGE (A) 08/18/2017 1322    STUDIES: No results found.     ELIGIBLE FOR AVAILABLE RESEARCH PROTOCOL: no  ASSESSMENT: 75 y.o. [place] woman with a remote history of early stage endometrial cancer, subsequently status post right breast upper outer quadrant biopsy 01/22/2016 for a clinically multifocal T2 N0, stage 2A invasive ductal carcinoma, grade 1, estrogen and progesterone receptor positive, HER-2 negative, with an  MIB-1 between 10 and 15%.  (1) right axillary lymph node biopsy 03/02/2016 positive  (2) genetics testing 01/13/2016 through the Custom gene panel offered by [facility] found no deleterious mutations in  ATM, BARD1, BRCA1, BRCA2, BRIP1, CDH1, CHEK2, EPCAM, FANCC, MLH1, MSH2, MSH6, MUTYH, NBN, PALB2, PMS2, POLD1, PTEN, RAD51C, RAD51D, TP53, and XRCC2  METASTATIC DISEASE: OCT 2017 (3) CT scans of the chest abdomen and pelvis obtained 03/10/2016 are consistent with bilateral lung metastases and mediastinal and hilar nodal involvement, but no liver or bone spread  (a) bronchoscopic lymph node biopsy 2 (station 7, 13R) 03/28/2016 confirms metastatic adenocarcinoma, estrogen receptor positive, HER-2 not amplified  (b) baseline CA-27-29 on 04/18/2016 was 137.5.  (4) letrozole started 03/15/2016, palbociclib added 03/29/2016 at 125 mg/day, 21/7  (a) dose decreased to 100 mg per day, 21/7, beginning with February cycle  (b) palbociclib held 01/31/2017, with increasing symptoms  (c) palbociclib resumed October 2018 at 75 mg daily  (d) palbociclib dose reduced to 75 mg every other day February through April 2019  (e) palbociclib dose resumed at 75 mg daily as of 10/17/2017  (f) palbociclib dose decreased to 75 mg every other day beginning 07/31/2019  (g) palbociclib resumed at 75 mg daily, 21 days on 7 off, as of 08/25/2019  (5) status post double right lumpectomies and right axillary lymph node sampling 01/13/2017 for 2 separate invasive ductal carcinoma lesions, pT1a and pT1b, N1a, with negative margins, both lesions being estrogen and progesterone receptor positive and HER-2 negative  (6) adjuvant radiation completed 07/05/2017 1. 50.4 Gy in 28 fractions to the right breast and supraclavicular region using whole-breast tangent fields. 2. Boost to the seroma delivered an additional 10 Gy in 5 fractions. The total dose was 60.4 Gy.  (7) restaging studies:  (a) CT scan of the chest and bone scan  06/23/2017 showed stable scattered very small lung nodules, no bone lesions  (b) CT of the chest 10/17/2017 showed no new or progressive metastatic disease in the chest. The small left lower lobe pulmonary nodule is stable  (c) PET scan on 03/02/2018: shows no findings for residual or recurrent right breast cancer  (d) chest CT scan stable, questionable right renal cyst noted  (e) chest CT 09/18/2020 shows no measurable disease   (8) right upper pole renal lesion noted to be enlarging on CT scan 06/22/2017  (a) no uptake on PET scan obtained 03/02/2018   PLAN: Nalee is now close to 5 years out from definitive diagnosis of metastatic breast cancer.  Her disease is very well controlled.  She is tolerating treatment moderately well.  Her white cell count remains borderline but we are not having to make any changes  in her palbociclib dose at this point.  I do not have a simple solution for the fatigue and weight issues.  I encouraged her to walk as much as she can.  I did give her information on the wellness and weight loss program that [facility] she will investigate whether her insurance will cover her participating.  Otherwise she will return to see me in 3 months, with lab work at that time.  She will also have a CT of the chest prior to that visit.  Total encounter time 25 minutes.*   Shadrick Senne, Virgie Dad, MD  01/26/21 2:41 PM Medical Oncology and Hematology [facility] [place], [place] [address] Tel. [phone]    Fax. [phone]   I, Wilburn Mylar, am acting as scribe for Dr. Virgie Dad. Mirren Gest.  I, Lurline Del MD, have reviewed the above documentation for accuracy and completeness, and I agree with the above.   *Total Encounter Time as defined by the Centers for Medicare and Medicaid Services includes, in addition to the face-to-face time of a patient visit (documented in the note above) non-face-to-face time: obtaining and reviewing  outside history, ordering and reviewing medications, tests or procedures, care coordination (communications with other health care professionals or caregivers) and documentation in the medical record.

## 2021-01-27 LAB — CANCER ANTIGEN 27.29: CA 27.29: 37.2 U/mL (ref 0.0–38.6)

## 2021-02-22 ENCOUNTER — Other Ambulatory Visit (HOSPITAL_COMMUNITY): Payer: Self-pay

## 2021-02-24 ENCOUNTER — Ambulatory Visit (INDEPENDENT_AMBULATORY_CARE_PROVIDER_SITE_OTHER): Payer: Medicare Other

## 2021-02-24 DIAGNOSIS — Z0000 Encounter for general adult medical examination without abnormal findings: Secondary | ICD-10-CM

## 2021-02-24 NOTE — Progress Notes (Signed)
I connected with [name] today by telephone and verified that I am speaking with the correct person using two identifiers. Location patient: home Location provider: work Persons participating in the virtual visit: patient, provider.   I discussed the limitations, risks, security and privacy concerns of performing an evaluation and management service by telephone and the availability of in person appointments. I also discussed with the patient that there may be a patient responsible charge related to this service. The patient expressed understanding and verbally consented to this telephonic visit.    Interactive audio and video telecommunications were attempted between this provider and patient, however failed, due to patient having technical difficulties OR patient did not have access to video capability.  We continued and completed visit with audio only.  Some vital signs may be absent or patient reported.   Time Spent with patient on telephone encounter: 30 minutes  Subjective:   [name] is a 75 y.o. female who presents for Medicare Annual (Subsequent) preventive examination.  Review of Systems     Cardiac Risk Factors include: advanced age (>73mn, >>81women);dyslipidemia;family history of premature cardiovascular disease;hypertension;obesity (BMI >30kg/m2)     Objective:    There were no vitals filed for this visit. There is no height or weight on file to calculate BMI.  Advanced Directives 02/24/2021 08/08/2017 04/25/2017 02/02/2017 01/04/2017 12/21/2016 07/22/2016  Does Patient Have a Medical Advance Directive? Yes Yes Yes Yes Yes Yes No  Type of Advance Directive Living will;Healthcare Power of [place]Living will [place] - [place] -  Does patient want to make changes to medical advance directive? No - Patient declined - - - - - -  Copy of  [place] Chart? No - copy requested Yes - Yes - - -  Would patient like information on creating a medical advance directive? - - - - - - No - Patient declined  Pre-existing out of facility DNR order (yellow form or pink MOST form) - - - - - - -    Current Medications (verified) Outpatient Encounter Medications as of 02/24/2021  Medication Sig   acetaminophen (TYLENOL) 500 MG tablet Take 1,000 mg by mouth every 4 (four) hours as needed for moderate pain or fever.   aspirin EC 81 MG tablet Take 81 mg by mouth daily at 6 PM. 1700   Biotin 1 MG CAPS Take by mouth.   brimonidine (ALPHAGAN) 0.2 % ophthalmic solution INSTILL 1 DROP INTO EACH EYE THREE TIMES DAILY   brimonidine-timolol (COMBIGAN) 0.2-0.5 % ophthalmic solution Place 1 drop into both eyes three times daily   cholecalciferol (VITAMIN D) 1000 units tablet Take 1,000 Units by mouth daily.   dorzolamide-timolol (COSOPT) 22.3-6.8 MG/ML ophthalmic solution Place 1 drop into both eyes 2 (two) times daily.   letrozole (FEMARA) 2.5 MG tablet TAKE 1 TABLET BY MOUTH AT BEDTIME.   lisinopril (ZESTRIL) 5 MG tablet Take 1 tablet (5 mg total) by mouth daily.   lovastatin (MEVACOR) 20 MG tablet Take 1 tablet (20 mg total) by mouth at bedtime.   Netarsudil-Latanoprost (ROCKLATAN) 0.02-0.005 % SOLN Apply to eye.   palbociclib (IBRANCE) 75 MG tablet TAKE 1 TABLET (75 MG TOTAL) BY MOUTH DAILY. TAKE FOR 21 DAYS ON, 7 DAYS OFF, REPEAT EVERY 28 DAYS.   pioglitazone (ACTOS) 15 MG tablet Take 1 tablet (15 mg total) by mouth daily.   prednisoLONE acetate (PRED FORTE)  1 % ophthalmic suspension SMARTSIG:1 In Eye(s) 6 Times Daily   No facility-administered encounter medications on file as of 02/24/2021.    Allergies (verified) Codeine, Fluorescein, Lipitor [atorvastatin calcium], Oxycodone, Sitagliptin phosphate, and Sulfa drugs cross reactors   History: Past Medical History:  Diagnosis Date   Breast cancer ([place])    Cancer ([place])  02/2016   right breast   DIABETES MELLITUS, TYPE II 01/04/2007   only takes actoplus daily   Dizziness and giddiness 02/29/2008   DVT, HX OF    at age 51 in right buttocks   Dyspnea    due to lung cancer   Family history of breast cancer    GERD 01/04/2007   pt reports resolved    GLAUCOMA 07/30/2008   both eyes   History of blood transfusion    no abnormal  reaction   History of uterine cancer 2000   hysterectomy done   HYPERLIPIDEMIA 01/04/2007   taking Pravastatin daily   HYPERTENSION 01/04/2007   takes Lisinopril daily   Joint pain    Joint swelling    Leg cramps    LEG PAIN, LEFT 07/06/2007   NUMBNESS 07/30/2008   in fingers;pt states from [facility]   OSTEOARTHRITIS, HIP 09/25/2009   OTITIS MEDIA, ACUTE, BILATERAL 02/29/2008   Overweight(278.02) 01/04/2007   Peripheral vascular disease ([place])    Personal history of radiation therapy 2018   Pneumonia    PONV (postoperative nausea and vomiting)    SLEEP APNEA, OBSTRUCTIVE    doesn't use a cpap;study done about 3yr ago   [place] OF 01/04/2007   Vision loss    left eye   Past Surgical History:  Procedure Laterality Date   ABDOMINAL HYSTERECTOMY  2000   BREAST LUMPECTOMY Right 01/13/2017   x2   BREAST LUMPECTOMY WITH RADIOACTIVE SEED AND SENTINEL LYMPH NODE BIOPSY Right 01/13/2017   Procedure: RIGHT BREAST RADIOACTIVE SEED X'S 2 GUIDED LUMPECTOMY WITH RADIOACTIVE SEED TARGETED AXILLARYLYMPH NODE EXCISION AND RIGHT AXILLARY SENTINEL LYMPH NODE BIOPSY;  Surgeon: NAlphonsa Overall MD;  [facility]  Service: General;  Laterality: Right;  2 SEEDS IN RIGHT BREAST 1 SEED IN RIGHT AXILLARY NODE   CHOLECYSTECTOMY     ENDOBRONCHIAL ULTRASOUND Bilateral 03/28/2016   Procedure: ENDOBRONCHIAL ULTRASOUND;  Surgeon: RCollene Gobble MD;  Location: [facility];  Service: Cardiopulmonary;  Laterality: Bilateral;   EYE SURGERY  13   shunt left and lazer eye surgery on right cataract and retenia tear with repair   growth  removal  2004   from thumb   KNEE ARTHROSCOPY Right    mulitple eye surgeries     both eyes, cataracts with ioc done both eyes   OOPHORECTOMY     right lumpectomy with axillary node dissection Right 01/2017   TOTAL HIP ARTHROPLASTY  06/24/2011   Procedure: TOTAL HIP ARTHROPLASTY;  Surgeon: FKerin Salen  [facility]  Service: Orthopedics;  Laterality: Right;   TOTAL HIP ARTHROPLASTY Left 11/12/2012   Dr RMayer Camel  TOTAL HIP ARTHROPLASTY Left 11/12/2012   Procedure: TOTAL HIP ARTHROPLASTY;  Surgeon: FKerin Salen MD;  Location[facility]  Service: Orthopedics;  Laterality: Left;  DEPUY PINNACLE   Family History  Problem Relation Age of Onset   Dementia Mother    Cancer Mother        Breast and lung cancer   Stroke Sister    Breast cancer Sister 541  Heart attack Maternal Aunt    Lung cancer Maternal  Grandmother        non smoker   Glaucoma Maternal Grandfather    Anesthesia problems Neg Hx    Social History   Socioeconomic History   Marital status: Married    Spouse name: Not on file   Number of children: Not on file   Years of education: Not on file   Highest education level: Not on file  Occupational History   Occupation: office administrator    Employer: [employer]  Tobacco Use   Smoking status: Never   Smokeless tobacco: Never  Vaping Use   Vaping Use: Never used  Substance and Sexual Activity   Alcohol use: No   Drug use: No   Sexual activity: Not Currently  Other Topics Concern   Not on file  Social History Narrative   Not on file   Social Determinants of Health   Financial Resource Strain: Low Risk    Difficulty of Paying Living Expenses: Not hard at all  Food Insecurity: No Food Insecurity   Worried About Charity fundraiser in the Last Year: Never true   [place] in the Last Year: Never true  Transportation Needs: No Transportation Needs   Lack of Transportation (Medical): No   Lack of Transportation (Non-Medical): No  Physical  Activity: Sufficiently Active   Days of Exercise per Week: 5 days   Minutes of Exercise per Session: 30 min  Stress: No Stress Concern Present   Feeling of Stress : Not at all  Social Connections: Socially Integrated   Frequency of Communication with Friends and Family: More than three times a week   Frequency of Social Gatherings with Friends and Family: More than three times a week   Attends Religious Services: More than 4 times per year   Active Member of [employer] or Organizations: Yes   Attends Music therapist: More than 4 times per year   Marital Status: Married    Tobacco Counseling Counseling given: Not Answered   Clinical Intake:  Pre-visit preparation completed: Yes  Pain : No/denies pain     Nutritional Risks: None Diabetes: Yes CBG done?: No Did pt. bring in CBG monitor from home?: No  How often do you need to have someone help you when you read instructions, pamphlets, or other written materials from your doctor or pharmacy?: 1 - Never What is the last grade level you completed in school?: Some college; no degree  Diabetic? yes  Interpreter Needed?: No  Information entered by :: Lisette Abu, LPN   Activities of Daily Living In your present state of health, do you have any difficulty performing the following activities: 02/24/2021 03/17/2020  Hearing? N N  Vision? N N  Difficulty concentrating or making decisions? N N  Walking or climbing stairs? N N  Dressing or bathing? N N  Doing errands, shopping? N N  Preparing Food and eating ? N -  Using the Toilet? N -  In the past six months, have you accidently leaked urine? N -  Do you have problems with loss of bowel control? N -  Managing your Medications? N -  Managing your Finances? N -  Housekeeping or managing your Housekeeping? N -  Some recent data might be hidden    Patient Care Team: Biagio Borg, MD as PCP - General (Internal Medicine) Alphonsa Overall, MD as Consulting  Physician (General Surgery) Magrinat, Virgie Dad, MD as Consulting Physician (Oncology) Kyung Rudd, MD as Consulting Physician (Radiation Oncology)  Bobbye Charleston, MD as Consulting Physician (Obstetrics and Gynecology) Nada Libman, MD as Referring Physician (Specialist) Collene Gobble, MD as Consulting Physician (Pulmonary Disease) Alexis Frock, MD as Consulting Physician (Urology) Ander Slade, Carlisle Beers, MD as Referring Physician (Ophthalmology) Raina Mina, RPH-CPP (Pharmacist)  Indicate any recent Medical Services you may have received from other than [facility] providers in the past year (date may be approximate).     Assessment:   This is a routine wellness examination for [name].  Hearing/Vision screen Hearing Screening - Comments:: Patient denied any hearing difficulty. Vision Screening - Comments:: Patient uses eyeglasses during reading. Eye exams done by Dr. Jovita Kussmaul.  Dietary issues and exercise activities discussed: Current Exercise Habits: Home exercise routine, Type of exercise: walking, Time (Minutes): 30, Frequency (Times/Week): 5, Weekly Exercise (Minutes/Week): 150, Intensity: Moderate, Exercise limited by: respiratory conditions(s);orthopedic condition(s)   Goals Addressed               This Visit's Progress     Patient Stated (pt-stated)        My goal is to get rid of my cancer.      Depression Screen PHQ 2/9 Scores 02/24/2021 03/17/2020 01/18/2019 09/04/2018 08/08/2017 07/20/2017 04/25/2017  PHQ - 2 Score 0 0 0 0 0 0 0  PHQ- 9 Score - 0 - - - - -    Fall Risk Fall Risk  02/24/2021 03/17/2020 01/18/2019 09/04/2018 08/08/2017  Falls in the past year? 0 0 0 0 No  Comment - - - - -  Number falls in past yr: 0 0 - - -  Injury with Fall? 0 0 - - -  Risk for fall due to : No Fall Risks No Fall Risks - - -  Follow up Falls evaluation completed Falls evaluation completed - - -    FALL RISK PREVENTION PERTAINING TO THE HOME:  Any stairs in or around the  home? No  If so, are there any without handrails? No  Home free of loose throw rugs in walkways, pet beds, electrical cords, etc? Yes  Adequate lighting in your home to reduce risk of falls? Yes   ASSISTIVE DEVICES UTILIZED TO PREVENT FALLS:  Life alert? No  Use of a cane, walker or w/c? No  Grab bars in the bathroom? No  Shower chair or bench in shower? Yes  Elevated toilet seat or a handicapped toilet? Yes   TIMED UP AND GO:  Was the test performed? No .  Length of time to ambulate 10 feet: n/a sec.   Gait steady and fast with assistive device  Cognitive Function: Normal cognitive status assessed by direct observation by this Nurse Health Advisor. No abnormalities found.          Immunizations Immunization History  Administered Date(s) Administered   Influenza Split 04/05/2012, 03/16/2020   Influenza Whole 03/27/2006, 04/14/2008   Influenza,inj,Quad PF,6+ Mos 03/27/2014   Influenza-Unspecified 03/13/2013, 03/11/2015, 03/07/2016   PFIZER(Purple Top)SARS-COV-2 Vaccination 07/03/2019, 07/24/2019, 02/27/2020, 09/10/2020    TDAP status: Due, Education has been provided regarding the importance of this vaccine. Advised may receive this vaccine at local pharmacy or Health Dept. Aware to provide a copy of the vaccination record if obtained from local pharmacy or Health Dept. Verbalized acceptance and understanding.  Flu Vaccine status: Up to date  Pneumococcal vaccine status: Due, Education has been provided regarding the importance of this vaccine. Advised may receive this vaccine at local pharmacy or Health Dept. Aware to provide a copy of the vaccination record  if obtained from local pharmacy or Health Dept. Verbalized acceptance and understanding.  Covid-19 vaccine status: Information provided on how to obtain vaccines.   Qualifies for Shingles Vaccine? Yes   Zostavax completed No   Shingrix Completed?: No.    Education has been provided regarding the importance of this  vaccine. Patient has been advised to call insurance company to determine out of pocket expense if they have not yet received this vaccine. Advised may also receive vaccine at local pharmacy or Health Dept. Verbalized acceptance and understanding.  Screening Tests Health Maintenance  Topic Date Due   COVID-19 Vaccine (5 - Booster for Pfizer series) 01/10/2021   INFLUENZA VACCINE  01/11/2021   Zoster Vaccines- Shingrix (1 of 2) 03/31/2021 (Originally 11/15/1964)   DEXA SCAN  12/29/2021 (Originally 11/16/2010)   COLONOSCOPY (Pts 45-38yr Insurance coverage will need to be confirmed)  12/29/2021 (Originally 11/16/1990)   TETANUS/TDAP  12/29/2021 (Originally 11/15/1964)   Hepatitis C Screening  12/29/2021 (Originally 11/16/1963)   PNA vac Low Risk Adult (1 of 2 - PCV13) 12/29/2021 (Originally 11/16/2010)   FOOT EXAM  03/17/2021   HEMOGLOBIN A1C  07/01/2021   OPHTHALMOLOGY EXAM  11/30/2021   HPV VACCINES  Aged Out    Health Maintenance  Health Maintenance Due  Topic Date Due   COVID-19 Vaccine (5 - Booster for [place]series) 01/10/2021   INFLUENZA VACCINE  01/11/2021    Colorectal cancer screening: No longer required.  (Patient declined)  Mammogram status: Completed 10/19/2020. Repeat every year  Bone density status: never done  Lung Cancer Screening: (Low Dose CT Chest recommended if Age 75-80years, 30 pack-year currently smoking OR have quit w/in 15years.) does not qualify.   Lung Cancer Screening Referral: no  Additional Screening:  Hepatitis C Screening: does qualify; Completed no  Vision Screening: Recommended annual ophthalmology exams for early detection of glaucoma and other disorders of the eye. Is the patient up to date with their annual eye exam?  Yes  Who is the provider or what is the name of the office in which the patient attends annual eye exams? Dr. FJovita KussmaulIf pt is not established with a provider, would they like to be referred to a provider to establish care? No .    Dental Screening: Recommended annual dental exams for proper oral hygiene  Community Resource Referral / Chronic Care Management: CRR required this visit?  No   CCM required this visit?  No      Plan:     I have personally reviewed and noted the following in the patient's chart:   Medical and social history Use of alcohol, tobacco or illicit drugs  Current medications and supplements including opioid prescriptions.  Functional ability and status Nutritional status Physical activity Advanced directives List of other physicians Hospitalizations, surgeries, and ER visits in previous 12 months Vitals Screenings to include cognitive, depression, and falls Referrals and appointments  In addition, I have reviewed and discussed with patient certain preventive protocols, quality metrics, and best practice recommendations. A written personalized care plan for preventive services as well as general preventive health recommendations were provided to patient.     SSheral Flow LPN   [id number]  Nurse Notes:  Patient is cogitatively intact. There were no vitals filed for this visit. There is no height or weight on file to calculate BMI. Patient stated that she has no issues with gait or balance; does not use any assistive devices.

## 2021-02-24 NOTE — Patient Instructions (Signed)
[name] , Thank you for taking time to come for your Medicare Wellness Visit. I appreciate your ongoing commitment to your health goals. Please review the following plan we discussed and let me know if I can assist you in the future.   Screening recommendations/referrals: Colonoscopy: never done; patient declined Mammogram: 10/19/2020 Bone Density: never done Recommended yearly ophthalmology/optometry visit for glaucoma screening and checkup Recommended yearly dental visit for hygiene and checkup  Vaccinations: Influenza vaccine: 03/16/2020 Pneumococcal vaccine: never done Tdap vaccine: never done Shingles vaccine: never done   Covid-19: 07/03/2019, 07/24/2019, 02/27/2020, 08/23/2020  Advanced directives: Please bring a copy of your health care power of attorney and living will to the office at your convenience.  Conditions/risks identified: Yes; My goal is to get rid of cancer.  Next appointment: Please schedule your next Medicare Wellness Visit with your Nurse Health Advisor in 1 year by calling [phone].   Preventive Care 75 Years and Older, Female Preventive care refers to lifestyle choices and visits with your health care provider that can promote health and wellness. What does preventive care include? A yearly physical exam. This is also called an annual well check. Dental exams once or twice a year. Routine eye exams. Ask your health care provider how often you should have your eyes checked. Personal lifestyle choices, including: Daily care of your teeth and gums. Regular physical activity. Eating a healthy diet. Avoiding tobacco and drug use. Limiting alcohol use. Practicing safe sex. Taking low-dose aspirin every day. Taking vitamin and mineral supplements as recommended by your health care provider. What happens during an annual well check? The services and screenings done by your health care provider during your annual well check will depend on your age, overall  health, lifestyle risk factors, and family history of disease. Counseling  Your health care provider may ask you questions about your: Alcohol use. Tobacco use. Drug use. Emotional well-being. Home and relationship well-being. Sexual activity. Eating habits. History of falls. Memory and ability to understand (cognition). Work and work Statistician. Reproductive health. Screening  You may have the following tests or measurements: Height, weight, and BMI. Blood pressure. Lipid and cholesterol levels. These may be checked every 5 years, or more frequently if you are over 70 years old. Skin check. Lung cancer screening. You may have this screening every year starting at age 75 if you have a 30-pack-year history of smoking and currently smoke or have quit within the past 15 years. Fecal occult blood test (FOBT) of the stool. You may have this test every year starting at age 75. Flexible sigmoidoscopy or colonoscopy. You may have a sigmoidoscopy every 5 years or a colonoscopy every 10 years starting at age 75. Hepatitis C blood test. Hepatitis B blood test. Sexually transmitted disease (STD) testing. Diabetes screening. This is done by checking your blood sugar (glucose) after you have not eaten for a while (fasting). You may have this done every 1-3 years. Bone density scan. This is done to screen for osteoporosis. You may have this done starting at age 75. Mammogram. This may be done every 1-2 years. Talk to your health care provider about how often you should have regular mammograms. Talk with your health care provider about your test results, treatment options, and if necessary, the need for more tests. Vaccines  Your health care provider may recommend certain vaccines, such as: Influenza vaccine. This is recommended every year. Tetanus, diphtheria, and acellular pertussis (Tdap, Td) vaccine. You may need a Td booster every 10  years. Zoster vaccine. You may need this after age  75. Pneumococcal 13-valent conjugate (PCV13) vaccine. One dose is recommended after age 75. Pneumococcal polysaccharide (PPSV23) vaccine. One dose is recommended after age 75. Talk to your health care provider about which screenings and vaccines you need and how often you need them. This information is not intended to replace advice given to you by your health care provider. Make sure you discuss any questions you have with your health care provider. Document Released: 06/26/2015 Document Revised: 02/17/2016 Document Reviewed: 03/31/2015 Elsevier Interactive Patient Education  2017 [place] Prevention in the Home Falls can cause injuries. They can happen to people of all ages. There are many things you can do to make your home safe and to help prevent falls. What can I do on the outside of my home? Regularly fix the edges of walkways and driveways and fix any cracks. Remove anything that might make you trip as you walk through a door, such as a raised step or threshold. Trim any bushes or trees on the path to your home. Use bright outdoor lighting. Clear any walking paths of anything that might make someone trip, such as rocks or tools. Regularly check to see if handrails are loose or broken. Make sure that both sides of any steps have handrails. Any raised decks and porches should have guardrails on the edges. Have any leaves, snow, or ice cleared regularly. Use sand or salt on walking paths during winter. Clean up any spills in your garage right away. This includes oil or grease spills. What can I do in the bathroom? Use night lights. Install grab bars by the toilet and in the tub and shower. Do not use towel bars as grab bars. Use non-skid mats or decals in the tub or shower. If you need to sit down in the shower, use a plastic, non-slip stool. Keep the floor dry. Clean up any water that spills on the floor as soon as it happens. Remove soap buildup in the tub or shower  regularly. Attach bath mats securely with double-sided non-slip rug tape. Do not have throw rugs and other things on the floor that can make you trip. What can I do in the bedroom? Use night lights. Make sure that you have a light by your bed that is easy to reach. Do not use any sheets or blankets that are too big for your bed. They should not hang down onto the floor. Have a firm chair that has side arms. You can use this for support while you get dressed. Do not have throw rugs and other things on the floor that can make you trip. What can I do in the kitchen? Clean up any spills right away. Avoid walking on wet floors. Keep items that you use a lot in easy-to-reach places. If you need to reach something above you, use a strong step stool that has a grab bar. Keep electrical cords out of the way. Do not use floor polish or wax that makes floors slippery. If you must use wax, use non-skid floor wax. Do not have throw rugs and other things on the floor that can make you trip. What can I do with my stairs? Do not leave any items on the stairs. Make sure that there are handrails on both sides of the stairs and use them. Fix handrails that are broken or loose. Make sure that handrails are as long as the stairways. Check any carpeting to make sure  that it is firmly attached to the stairs. Fix any carpet that is loose or worn. Avoid having throw rugs at the top or bottom of the stairs. If you do have throw rugs, attach them to the floor with carpet tape. Make sure that you have a light switch at the top of the stairs and the bottom of the stairs. If you do not have them, ask someone to add them for you. What else can I do to help prevent falls? Wear shoes that: Do not have high heels. Have rubber bottoms. Are comfortable and fit you well. Are closed at the toe. Do not wear sandals. If you use a stepladder: Make sure that it is fully opened. Do not climb a closed stepladder. Make sure that  both sides of the stepladder are locked into place. Ask someone to hold it for you, if possible. Clearly mark and make sure that you can see: Any grab bars or handrails. First and last steps. Where the edge of each step is. Use tools that help you move around (mobility aids) if they are needed. These include: Canes. Walkers. Scooters. Crutches. Turn on the lights when you go into a dark area. Replace any light bulbs as soon as they burn out. Set up your furniture so you have a clear path. Avoid moving your furniture around. If any of your floors are uneven, fix them. If there are any pets around you, be aware of where they are. Review your medicines with your doctor. Some medicines can make you feel dizzy. This can increase your chance of falling. Ask your doctor what other things that you can do to help prevent falls. This information is not intended to replace advice given to you by your health care provider. Make sure you discuss any questions you have with your health care provider. Document Released: 03/26/2009 Document Revised: 11/05/2015 Document Reviewed: 07/04/2014 Elsevier Interactive Patient Education  2017 [employer].

## 2021-03-17 DIAGNOSIS — Z23 Encounter for immunization: Secondary | ICD-10-CM | POA: Diagnosis not present

## 2021-03-18 ENCOUNTER — Other Ambulatory Visit: Payer: Self-pay

## 2021-03-18 ENCOUNTER — Ambulatory Visit
Admission: EM | Admit: 2021-03-18 | Discharge: 2021-03-18 | Disposition: A | Discharge status: Home / Self Care | Payer: Medicare Other | Attending: Urgent Care | Admitting: Urgent Care

## 2021-03-18 DIAGNOSIS — M545 Low back pain, unspecified: Secondary | ICD-10-CM

## 2021-03-18 MED ORDER — MELOXICAM 7.5 MG PO TABS: 7.5000 mg | ORAL_TABLET | Freq: Every day | ORAL | 0 refills | Status: DC

## 2021-03-18 MED ORDER — METHOCARBAMOL 500 MG PO TABS: 500.0000 mg | ORAL_TABLET | Freq: Two times a day (BID) | ORAL | 0 refills | Status: DC

## 2021-03-18 NOTE — Discharge Instructions (Addendum)
Please take meloxicam once daily for pain and inflammation of your back. Hold off on your Advil. Use methocarbamol (Robaxin) twice daily as a muscle relaxant. Continue using Tylenol for your pains as well.

## 2021-03-18 NOTE — ED Provider Notes (Signed)
[name]   MRN: [id number] DOB: 11-21-1945  Subjective:   [name] is a 75 y.o. female presenting for 1 week history of persistent bilateral low back pain.  Patient states that she woke up with the symptoms.  No fall, trauma, weakness, numbness or tingling.  She does have a harder time bending.  Admits that she is actually been improving over the past week but wanted to be evaluated.  No changes to bowel or urinary habits.  She does have a history of spondylosis, last seen 06/15/2009.  She has uncontrolled diabetes, not treated with insulin.  No current facility-administered medications for this encounter.  Current Outpatient Medications:    acetaminophen (TYLENOL) 500 MG tablet, Take 1,000 mg by mouth every 4 (four) hours as needed for moderate pain or fever., Disp: , Rfl:    aspirin EC 81 MG tablet, Take 81 mg by mouth daily at 6 PM. 1700, Disp: , Rfl:    Biotin 1 MG CAPS, Take by mouth., Disp: , Rfl:    brimonidine (ALPHAGAN) 0.2 % ophthalmic solution, INSTILL 1 DROP INTO EACH EYE THREE TIMES DAILY, Disp: , Rfl:    brimonidine-timolol (COMBIGAN) 0.2-0.5 % ophthalmic solution, Place 1 drop into both eyes three times daily, Disp: , Rfl:    cholecalciferol (VITAMIN D) 1000 units tablet, Take 1,000 Units by mouth daily., Disp: , Rfl:    dorzolamide-timolol (COSOPT) 22.3-6.8 MG/ML ophthalmic solution, Place 1 drop into both eyes 2 (two) times daily., Disp: , Rfl:    letrozole (FEMARA) 2.5 MG tablet, TAKE 1 TABLET BY MOUTH AT BEDTIME., Disp: 90 tablet, Rfl: 4   lisinopril (ZESTRIL) 5 MG tablet, Take 1 tablet (5 mg total) by mouth daily., Disp: 90 tablet, Rfl: 3   lovastatin (MEVACOR) 20 MG tablet, Take 1 tablet (20 mg total) by mouth at bedtime., Disp: 90 tablet, Rfl: 3   Netarsudil-Latanoprost (ROCKLATAN) 0.02-0.005 % SOLN, Apply to eye., Disp: , Rfl:    palbociclib (IBRANCE) 75 MG tablet, TAKE 1 TABLET (75 MG TOTAL) BY MOUTH DAILY. TAKE FOR 21 DAYS ON, 7 DAYS OFF, REPEAT  EVERY 28 DAYS., Disp: 21 tablet, Rfl: 6   pioglitazone (ACTOS) 15 MG tablet, Take 1 tablet (15 mg total) by mouth daily., Disp: 90 tablet, Rfl: 3   prednisoLONE acetate (PRED FORTE) 1 % ophthalmic suspension, SMARTSIG:1 In Eye(s) 6 Times Daily, Disp: , Rfl:    Allergies  Allergen Reactions   Codeine Hives    Hycodan syrup   Fluorescein Nausea And Vomiting    ? IV dye for retina specialist   Lipitor [Atorvastatin Calcium]     Leg cramp   Oxycodone Nausea And Vomiting    Patient vomited for 3 days after taking   Sitagliptin Phosphate Nausea And Vomiting   Sulfa Drugs Cross Reactors Nausea And Vomiting    Past Medical History:  Diagnosis Date   Breast cancer ([place])    Cancer ([place]) 02/2016   right breast   DIABETES MELLITUS, TYPE II 01/04/2007   only takes actoplus daily   Dizziness and giddiness 02/29/2008   DVT, HX OF    at age 52 in right buttocks   Dyspnea    due to lung cancer   Family history of breast cancer    GERD 01/04/2007   pt reports resolved    GLAUCOMA 07/30/2008   both eyes   History of blood transfusion    no abnormal  reaction   History of uterine cancer 2000   hysterectomy done  HYPERLIPIDEMIA 01/04/2007   taking Pravastatin daily   HYPERTENSION 01/04/2007   takes Lisinopril daily   Joint pain    Joint swelling    Leg cramps    LEG PAIN, LEFT 07/06/2007   NUMBNESS 07/30/2008   in fingers;pt states from [facility]   OSTEOARTHRITIS, HIP 09/25/2009   OTITIS MEDIA, ACUTE, BILATERAL 02/29/2008   Overweight(278.02) 01/04/2007   Peripheral vascular disease ([place])    Personal history of radiation therapy 2018   Pneumonia    PONV (postoperative nausea and vomiting)    SLEEP APNEA, OBSTRUCTIVE    doesn't use a cpap;study done about 55yrs ago   [place], HX OF 01/04/2007   Vision loss    left eye     Past Surgical History:  Procedure Laterality Date   ABDOMINAL HYSTERECTOMY  2000   BREAST LUMPECTOMY Right 01/13/2017   x2   BREAST LUMPECTOMY  WITH RADIOACTIVE SEED AND SENTINEL LYMPH NODE BIOPSY Right 01/13/2017   Procedure: RIGHT BREAST RADIOACTIVE SEED X'S 2 GUIDED LUMPECTOMY WITH RADIOACTIVE SEED TARGETED AXILLARYLYMPH NODE EXCISION AND RIGHT AXILLARY SENTINEL LYMPH NODE BIOPSY;  Surgeon: Alphonsa Overall, MD;  Location: [facility];  Service: General;  Laterality: Right;  2 SEEDS IN RIGHT BREAST 1 SEED IN RIGHT AXILLARY NODE   CHOLECYSTECTOMY     ENDOBRONCHIAL ULTRASOUND Bilateral 03/28/2016   Procedure: ENDOBRONCHIAL ULTRASOUND;  Surgeon: Collene Gobble, MD;  Location: [facility];  Service: Cardiopulmonary;  Laterality: Bilateral;   EYE SURGERY  13   shunt left and lazer eye surgery on right cataract and retenia tear with repair   growth removal  2004   from thumb   KNEE ARTHROSCOPY Right    mulitple eye surgeries     both eyes, cataracts with ioc done both eyes   OOPHORECTOMY     right lumpectomy with axillary node dissection Right 01/2017   TOTAL HIP ARTHROPLASTY  06/24/2011   Procedure: TOTAL HIP ARTHROPLASTY;  Surgeon: Kerin Salen;  Location: [facility];  Service: Orthopedics;  Laterality: Right;   TOTAL HIP ARTHROPLASTY Left 11/12/2012   Dr Mayer Camel   TOTAL HIP ARTHROPLASTY Left 11/12/2012   Procedure: TOTAL HIP ARTHROPLASTY;  Surgeon: Kerin Salen, MD;  Location: [facility];  Service: Orthopedics;  Laterality: Left;  DEPUY PINNACLE    Family History  Problem Relation Age of Onset   Dementia Mother    Cancer Mother        Breast and lung cancer   Stroke Sister    Breast cancer Sister 50   Heart attack Maternal Aunt    Lung cancer Maternal Grandmother        non smoker   Glaucoma Maternal Grandfather    Anesthesia problems Neg Hx     Social History   Tobacco Use   Smoking status: Never   Smokeless tobacco: Never  Vaping Use   Vaping Use: Never used  Substance Use Topics   Alcohol use: No   Drug use: No    ROS   Objective:   Vitals: BP (!) 172/88 (BP Location: Left Arm)   Pulse 84   Temp 98.3 F (36.8 C) (Oral)    Resp 18   SpO2 96%   BP recheck 156/74.   Physical Exam Constitutional:      General: She is not in acute distress.    Appearance: Normal appearance. She is well-developed. She is obese. She is not ill-appearing, toxic-appearing or diaphoretic.  HENT:     Head: Normocephalic and atraumatic.  Nose: Nose normal.     Mouth/Throat:     Mouth: Mucous membranes are moist.     Pharynx: Oropharynx is clear.  Eyes:     General: No scleral icterus.       Right eye: No discharge.        Left eye: No discharge.     Extraocular Movements: Extraocular movements intact.     Conjunctiva/sclera: Conjunctivae normal.     Pupils: Pupils are equal, round, and reactive to light.  Cardiovascular:     Rate and Rhythm: Normal rate.  Pulmonary:     Effort: Pulmonary effort is normal.  Musculoskeletal:     Comments: Near full range of motion throughout.  Strength 5/5 for lower extremities.  Patient ambulates without any assistance at expected pace.  No ecchymosis, swelling, lacerations or abrasions.  Patient does have paraspinal muscle tenderness along the lumbar back excluding the midline.  Negative straight leg raise bilaterally.    Skin:    General: Skin is warm and dry.  Neurological:     General: No focal deficit present.     Mental Status: She is alert and oriented to person, place, and time.     Motor: No weakness.     Coordination: Coordination normal.     Gait: Gait normal.     Deep Tendon Reflexes: Reflexes normal.  Psychiatric:        Mood and Affect: Mood normal.        Behavior: Behavior normal.        Thought Content: Thought content normal.        Judgment: Judgment normal.    Recent Results (from the past 2160 hour(s))  POCT glycosylated hemoglobin (Hb A1C)     Status: Abnormal   Collection Time: 12/29/20  4:11 PM  Result Value Ref Range   Hemoglobin A1C 8.7 (A) 4.0 - 5.6 %   HbA1c POC (<> result, manual entry) 8.7 4.0 - 5.6 %   HbA1c, POC (prediabetic range) 8.7 (A) 5.7  - 6.4 %   HbA1c, POC (controlled diabetic range) 8.7 (A) 0.0 - 7.0 %  CBC with Differential/Platelet     Status: Abnormal   Collection Time: 01/26/21 11:55 AM  Result Value Ref Range   WBC 2.1 (L) 4.0 - 10.5 K/uL   RBC 3.84 (L) 3.87 - 5.11 MIL/uL   Hemoglobin 12.5 12.0 - 15.0 g/dL   HCT 36.4 36.0 - 46.0 %   MCV 94.8 80.0 - 100.0 fL   MCH 32.6 26.0 - 34.0 pg   MCHC 34.3 30.0 - 36.0 g/dL   RDW 14.6 11.5 - 15.5 %   Platelets 266 150 - 400 K/uL   nRBC 0.0 0.0 - 0.2 %   Neutrophils Relative % 56 %   Neutro Abs 1.2 (L) 1.7 - 7.7 K/uL   Lymphocytes Relative 31 %   Lymphs Abs 0.6 (L) 0.7 - 4.0 K/uL   Monocytes Relative 9 %   Monocytes Absolute 0.2 0.1 - 1.0 K/uL   Eosinophils Relative 3 %   Eosinophils Absolute 0.1 0.0 - 0.5 K/uL   Basophils Relative 1 %   Basophils Absolute 0.0 0.0 - 0.1 K/uL   WBC Morphology OCC REACTIVE LYMPHS PRESENT    Smear Review Reviewed    Immature Granulocytes 0 %   Abs Immature Granulocytes 0.00 0.00 - 0.07 K/uL    Comment: Performed at [facility], [address], [place], [place] [address]  CA 27.29  Status: None   Collection Time: 01/26/21 11:55 AM  Result Value Ref Range   CA 27.29 37.2 0.0 - 38.6 U/mL    Comment: (NOTE) Siemens Centaur Immunochemiluminometric Methodology (ICMA) Values obtained with different assay methods or kits cannot be used interchangeably. Results cannot be interpreted as absolute evidence of the presence or absence of malignant disease. Performed At: [facility] [place], [place] [id number] Rush Farmer MD LE:[id number]   Comprehensive metabolic panel     Status: Abnormal   Collection Time: 01/26/21 11:55 AM  Result Value Ref Range   Sodium 136 135 - 145 mmol/L   Potassium 4.3 3.5 - 5.1 mmol/L   Chloride 104 98 - 111 mmol/L   CO2 25 22 - 32 mmol/L   Glucose, Bld 260 (H) 70 - 99 mg/dL    Comment: Glucose reference range applies only to samples taken after fasting for  at least 8 hours.   BUN 18 8 - 23 mg/dL   Creatinine, Ser 0.89 0.44 - 1.00 mg/dL   Calcium 9.2 8.9 - 10.3 mg/dL   Total Protein 6.9 6.5 - 8.1 g/dL   Albumin 3.9 3.5 - 5.0 g/dL   AST 11 (L) 15 - 41 U/L   ALT 14 0 - 44 U/L   Alkaline Phosphatase 79 38 - 126 U/L   Total Bilirubin 0.4 0.3 - 1.2 mg/dL   GFR, Estimated >60 >60 mL/min    Comment: (NOTE) Calculated using the CKD-EPI Creatinine Equation (2021)    Anion gap 7 5 - 15    Comment: Performed at [facility], [place] [address] [place], [place] [address]    Assessment and Plan :   PDMP not reviewed this encounter.  1. Acute bilateral low back pain without sciatica     Patient has been doing well with Tylenol, Advil and is progressing in the right direction.  She has no midline tenderness and therefore without that for trauma will defer imaging.  Offered her meloxicam, Robaxin. Counseled patient on potential for adverse effects with medications prescribed/recommended today, ER and return-to-clinic precautions discussed, patient verbalized understanding.    Jaynee Eagles, [place] 03/18/21 [id number]

## 2021-03-18 NOTE — ED Triage Notes (Signed)
Pt c/o lower back pain since last Wednesday after getting out of bed. Denies injury. States using ice, taking tylenol and Advil, and doing stretches. States unable to bend down.

## 2021-03-25 ENCOUNTER — Other Ambulatory Visit: Payer: Self-pay

## 2021-03-25 ENCOUNTER — Ambulatory Visit
Admission: EM | Admit: 2021-03-25 | Discharge: 2021-03-25 | Discharge status: Against Medical Advice | Payer: Medicare Other

## 2021-03-27 DIAGNOSIS — U071 COVID-19: Secondary | ICD-10-CM | POA: Diagnosis not present

## 2021-04-13 ENCOUNTER — Telehealth: Payer: Self-pay

## 2021-04-13 NOTE — Telephone Encounter (Signed)
Oral Oncology [name] Advocate Encounter  Met [name] in [place] to complete re-enrollment application for [facility] in an effort to reduce [name]'s out of pocket expense for [facility] to $0.    Application completed and faxed to [phone].   [employer] [name] assistance phone number for follow up is [phone].   This encounter will be updated until final determination.   [name] [name] [place] Phone [phone] Fax [phone] 04/13/2021 4:15 PM

## 2021-04-19 DIAGNOSIS — Z23 Encounter for immunization: Secondary | ICD-10-CM | POA: Diagnosis not present

## 2021-04-28 ENCOUNTER — Other Ambulatory Visit: Payer: Self-pay | Admitting: *Deleted

## 2021-04-28 ENCOUNTER — Other Ambulatory Visit: Payer: Self-pay

## 2021-04-28 ENCOUNTER — Inpatient Hospital Stay: Payer: Medicare Other | Attending: Oncology

## 2021-04-28 ENCOUNTER — Other Ambulatory Visit: Payer: Medicare Other

## 2021-04-28 ENCOUNTER — Ambulatory Visit: Payer: Medicare Other | Admitting: Oncology

## 2021-04-28 ENCOUNTER — Ambulatory Visit (HOSPITAL_COMMUNITY)
Admission: RE | Admit: 2021-04-28 | Discharge: 2021-04-28 | Disposition: A | Discharge status: Home / Self Care | Payer: Medicare Other | Source: Ambulatory Visit | Attending: Oncology | Admitting: Oncology

## 2021-04-28 DIAGNOSIS — Z923 Personal history of irradiation: Secondary | ICD-10-CM | POA: Insufficient documentation

## 2021-04-28 DIAGNOSIS — C7800 Secondary malignant neoplasm of unspecified lung: Secondary | ICD-10-CM | POA: Diagnosis not present

## 2021-04-28 DIAGNOSIS — Z170 Estrogen receptor positive status [ER+]: Secondary | ICD-10-CM

## 2021-04-28 DIAGNOSIS — C50411 Malignant neoplasm of upper-outer quadrant of right female breast: Secondary | ICD-10-CM | POA: Insufficient documentation

## 2021-04-28 DIAGNOSIS — R911 Solitary pulmonary nodule: Secondary | ICD-10-CM | POA: Diagnosis not present

## 2021-04-28 DIAGNOSIS — Z7189 Other specified counseling: Secondary | ICD-10-CM

## 2021-04-28 DIAGNOSIS — G620 Drug-induced polyneuropathy: Secondary | ICD-10-CM

## 2021-04-28 DIAGNOSIS — I700 Atherosclerosis of aorta: Secondary | ICD-10-CM | POA: Diagnosis not present

## 2021-04-28 DIAGNOSIS — Z79811 Long term (current) use of aromatase inhibitors: Secondary | ICD-10-CM | POA: Insufficient documentation

## 2021-04-28 DIAGNOSIS — T451X5A Adverse effect of antineoplastic and immunosuppressive drugs, initial encounter: Secondary | ICD-10-CM

## 2021-04-28 LAB — COMPREHENSIVE METABOLIC PANEL
ALT: 14 U/L (ref 0–44)
AST: 11 U/L — ABNORMAL LOW (ref 15–41)
Albumin: 3.9 g/dL (ref 3.5–5.0)
Alkaline Phosphatase: 86 U/L (ref 38–126)
Anion gap: 9 (ref 5–15)
BUN: 14 mg/dL (ref 8–23)
CO2: 23 mmol/L (ref 22–32)
Calcium: 8.9 mg/dL (ref 8.9–10.3)
Chloride: 106 mmol/L (ref 98–111)
Creatinine, Ser: 0.91 mg/dL (ref 0.44–1.00)
GFR, Estimated: >60 mL/min (ref >60)
Glucose, Bld: 274 mg/dL — ABNORMAL HIGH (ref 70–99)
Potassium: 4.5 mmol/L (ref 3.5–5.1)
Sodium: 138 mmol/L (ref 135–145)
Total Bilirubin: 0.5 mg/dL (ref 0.3–1.2)
Total Protein: 7.1 g/dL (ref 6.5–8.1)

## 2021-04-28 LAB — CBC WITH DIFFERENTIAL/PLATELET
Abs Immature Granulocytes: 0.01 10*3/uL (ref 0.00–0.07)
Basophils Absolute: 0 10*3/uL (ref 0.0–0.1)
Basophils Relative: 1 %
Eosinophils Absolute: 0 10*3/uL (ref 0.0–0.5)
Eosinophils Relative: 1 %
HCT: 37.8 % (ref 36.0–46.0)
Hemoglobin: 12.6 g/dL (ref 12.0–15.0)
Immature Granulocytes: 0 %
Lymphocytes Relative: 26 %
Lymphs Abs: 0.6 10*3/uL — ABNORMAL LOW (ref 0.7–4.0)
MCH: 32.1 pg (ref 26.0–34.0)
MCHC: 33.3 g/dL (ref 30.0–36.0)
MCV: 96.4 fL (ref 80.0–100.0)
Monocytes Absolute: 0.2 10*3/uL (ref 0.1–1.0)
Monocytes Relative: 7 %
Neutro Abs: 1.5 10*3/uL — ABNORMAL LOW (ref 1.7–7.7)
Neutrophils Relative %: 65 %
Platelets: 169 10*3/uL (ref 150–400)
RBC: 3.92 MIL/uL (ref 3.87–5.11)
RDW: 14.6 % (ref 11.5–15.5)
Smear Review: NORMAL
WBC: 2.3 10*3/uL — ABNORMAL LOW (ref 4.0–10.5)
nRBC: 0 % (ref 0.0–0.2)

## 2021-04-28 MED ORDER — IOHEXOL 350 MG/ML SOLN
60.0000 mL | Freq: Once | INTRAVENOUS | Status: AC | PRN
  Administered 2021-04-28: 60 mL via INTRAVENOUS

## 2021-04-29 LAB — CANCER ANTIGEN 27.29: CA 27.29: 43.1 U/mL — ABNORMAL HIGH (ref 0.0–38.6)

## 2021-05-03 NOTE — Progress Notes (Signed)
[facility]  [phone] [phone]     ID: TRIVIA HEFFELFINGER DOB: [birth year]  MR#: [id number]  KTG#:[id number]  Patient Care Team: Biagio Borg, MD as PCP - General (Internal Medicine) Alphonsa Overall, MD as Consulting Physician (General Surgery) Marijose Curington, Virgie Dad, MD as Consulting Physician (Oncology) Kyung Rudd, MD as Consulting Physician (Radiation Oncology) Bobbye Charleston, MD as Consulting Physician (Obstetrics and Gynecology) Nada Libman, MD as Referring Physician (Specialist) Collene Gobble, MD as Consulting Physician (Pulmonary Disease) Alexis Frock, MD as Consulting Physician (Urology) Ander Slade, Carlisle Beers, MD as Referring Physician (Ophthalmology) Raina Mina, RPH-CPP (Pharmacist) OTHER MD:   CHIEF COMPLAINT: Estrogen receptor positive breast cancer  CURRENT TREATMENT: Letrozole, palbociclib   INTERVAL HISTORY: Shawan returns today for follow-up of her estrogen receptor positive stage IV breast cancer.  She is accompanied by [name].  At this point we are seeing her every second or third month depending on intervening events.  Since her last visit, she underwent restaging chest CT on 04/28/2021 showing: no signs of metastatic disease; left thyroid nodule greater than 2 cm noted, with suggested thyroid ultrasound to follow.  She continues on letrozole.  Aside from hair thinning she reports no symptoms or complications related to this medication.  She does very well with the palbociclib also.  She is currently on 75 mg daily, 21 days on 7 days off.  She has a good relationship with our oral chemotherapy pharmacy agents and is able to contact them directly if she has questions.  We are continuing to follow her tumor marker: Lab Results  Component Value Date   CA2729 43.1 (H) 04/28/2021   CA2729 37.2 01/26/2021   CA2729 32.3 09/18/2020   CA2729 32.1 06/30/2020   CA2729 40.4 (H) 04/07/2020    REVIEW OF  SYSTEMS: Katha tells me she fell on the day of her CAT scan.  She had had nothing to eat that day and went to the bathroom and then when she stood up from the toilet did not quite faint but did slump down on the floor.  She was not able to get up without her husband's help.  She did not hit her head "or really any other part close quotes.  She is not aware of any palpitations passing out, or any other symptoms other than mild dizziness.  She did have COVID about a month ago and still has a little bit of a morning cough associated with that.  A detailed review of systems today was otherwise stable.   COVID 19 VACCINATION STATUS: Status post Pfizer x2 followed by booster August 2021; had COVID November 2022   BREAST CANCER HISTORY: From the original intake note:  Oriya had screening mammography showing some suspicious calcifications in the right breast leading to right diagnostic mammography with ultrasonography 01/22/2016 at [facility]. The breast density was category C. In the upper right breast there was a 2.3 cm mass with additional masses measuring 0.9 and 0.7 cm. There was also a possible additional 0.8 mass in the lower inner quadrant. Ultrasound confirmed an irregular hypoechoic mass in the right breast upper outer quadrant measuring 2.0 cm. There were other masses measuring 0.7 and 0.8 cm by ultrasonography. The right axilla was sonographically benign.  Biopsy of a 12:00 and 4:00 mass in the right breast 01/22/2016 showed ([id number]) both specimens showing invasive ductal carcinoma, grade 1 or 2, both 95% estrogen receptor positive, both 95% progesterone receptor positive, both with strong staining intensity,  with MIB-1 ranging from 10-15%, and both HER-2 negative, the signals ratio being 1.23-1.42, and the number per cell 1.85-2.59.  Her subsequent history is as detailed below   PAST MEDICAL HISTORY: Past Medical History:  Diagnosis Date   Breast cancer ([place])    Cancer ([place]) 02/2016    right breast   DIABETES MELLITUS, TYPE II 01/04/2007   only takes actoplus daily   Dizziness and giddiness 02/29/2008   DVT, HX OF    at age 36 in right buttocks   Dyspnea    due to lung cancer   Family history of breast cancer    GERD 01/04/2007   pt reports resolved    GLAUCOMA 07/30/2008   both eyes   History of blood transfusion    no abnormal  reaction   History of uterine cancer 2000   hysterectomy done   HYPERLIPIDEMIA 01/04/2007   taking Pravastatin daily   HYPERTENSION 01/04/2007   takes Lisinopril daily   Joint pain    Joint swelling    Leg cramps    LEG PAIN, LEFT 07/06/2007   NUMBNESS 07/30/2008   in fingers;pt states from Diamox   OSTEOARTHRITIS, HIP 09/25/2009   OTITIS MEDIA, ACUTE, BILATERAL 02/29/2008   Overweight(278.02) 01/04/2007   Peripheral vascular disease ([place])    Personal history of radiation therapy 2018   Pneumonia    PONV (postoperative nausea and vomiting)    SLEEP APNEA, OBSTRUCTIVE    doesn't use a cpap;study done about 34yr ago   [place] HX OF 01/04/2007   Vision loss    left eye    PAST SURGICAL HISTORY: Past Surgical History:  Procedure Laterality Date   ABDOMINAL HYSTERECTOMY  2000   BREAST LUMPECTOMY Right 01/13/2017   x2   BREAST LUMPECTOMY WITH RADIOACTIVE SEED AND SENTINEL LYMPH NODE BIOPSY Right 01/13/2017   Procedure: RIGHT BREAST RADIOACTIVE SEED X'S 2 GUIDED LUMPECTOMY WITH RADIOACTIVE SEED TARGETED AXILLARYLYMPH NODE EXCISION AND RIGHT AXILLARY SENTINEL LYMPH NODE BIOPSY;  Surgeon: NAlphonsa Overall MD;  Location: [facility]  Service: General;  Laterality: Right;  2 SEEDS IN RIGHT BREAST 1 SEED IN RIGHT AXILLARY NODE   CHOLECYSTECTOMY     ENDOBRONCHIAL ULTRASOUND Bilateral 03/28/2016   Procedure: ENDOBRONCHIAL ULTRASOUND;  Surgeon: RCollene Gobble MD;  Location: [facility];  Service: Cardiopulmonary;  Laterality: Bilateral;   EYE SURGERY  13   shunt left and lazer eye surgery on right cataract and retenia tear with  repair   growth removal  2004   from thumb   KNEE ARTHROSCOPY Right    mulitple eye surgeries     both eyes, cataracts with ioc done both eyes   OOPHORECTOMY     right lumpectomy with axillary node dissection Right 01/2017   TOTAL HIP ARTHROPLASTY  06/24/2011   Procedure: TOTAL HIP ARTHROPLASTY;  Surgeon: FKerin Salen  [facility]  Service: Orthopedics;  Laterality: Right;   TOTAL HIP ARTHROPLASTY Left 11/12/2012   Dr RMayer Camel  TOTAL HIP ARTHROPLASTY Left 11/12/2012   Procedure: TOTAL HIP ARTHROPLASTY;  Surgeon: FKerin Salen MD;  [facility]  Service: Orthopedics;  Laterality: Left;  DEPUY PINNACLE    FAMILY HISTORY Family History  Problem Relation Age of Onset   Dementia Mother    Cancer Mother        Breast and lung cancer   Stroke Sister    Breast cancer Sister 551  Heart attack Maternal Aunt    Lung cancer Maternal Grandmother  non smoker   Glaucoma Maternal Grandfather    Anesthesia problems Neg Hx   The patient's father died at age 49, the patient's mother died at age 51. She had breast and lung cancers diagnosed shortly before her death. The patient had no brothers, 2 sisters. One sister was diagnosed with breast cancer at the age of 91.   GYNECOLOGIC HISTORY:  No LMP recorded. Patient has had a hysterectomy. Menarche age 89, first live birth age 1, the patient is GX P1. She had a hysterectomy for endometrial cancer in the year 2000. She did not take hormone replacement. She did use oral contraceptives for more than 20 years remotely, with no complications.   SOCIAL HISTORY: (Updated July 2021). 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 She is home with [name]. He is a retired Dealer.Their son [name] also lives in [place].  The patient has 3 grandchildren aged 29, 20 and 28    ADVANCED DIRECTIVES: In place   HEALTH MAINTENANCE: Social History   Tobacco Use    Smoking status: Never   Smokeless tobacco: Never  Vaping Use   Vaping Use: Never used  Substance Use Topics   Alcohol use: No   Drug use: No     Colonoscopy: Never  PAP: Status post hysterectomy  Bone density: Remote   Allergies  Allergen Reactions   Codeine Hives    Hycodan syrup   Fluorescein Nausea And Vomiting    ? IV dye for retina specialist   Lipitor [Atorvastatin Calcium]     Leg cramp   Oxycodone Nausea And Vomiting    Patient vomited for 3 days after taking   Sitagliptin Phosphate Nausea And Vomiting   Sulfa Drugs Cross Reactors Nausea And Vomiting    Current Outpatient Medications  Medication Sig Dispense Refill   acetaminophen (TYLENOL) 500 MG tablet Take 1,000 mg by mouth every 4 (four) hours as needed for moderate pain or fever.     aspirin EC 81 MG tablet Take 81 mg by mouth daily at 6 PM. 1700     Biotin 1 MG CAPS Take by mouth.     brimonidine (ALPHAGAN) 0.2 % ophthalmic solution INSTILL 1 DROP INTO EACH EYE THREE TIMES DAILY     brimonidine-timolol (COMBIGAN) 0.2-0.5 % ophthalmic solution Place 1 drop into both eyes three times daily     cholecalciferol (VITAMIN D) 1000 units tablet Take 1,000 Units by mouth daily.     dorzolamide-timolol (COSOPT) 22.3-6.8 MG/ML ophthalmic solution Place 1 drop into both eyes 2 (two) times daily.     letrozole (FEMARA) 2.5 MG tablet TAKE 1 TABLET BY MOUTH AT BEDTIME. 90 tablet 4   lisinopril (ZESTRIL) 5 MG tablet Take 1 tablet (5 mg total) by mouth daily. 90 tablet 3   lovastatin (MEVACOR) 20 MG tablet Take 1 tablet (20 mg total) by mouth at bedtime. 90 tablet 3   meloxicam (MOBIC) 7.5 MG tablet Take 1 tablet (7.5 mg total) by mouth daily. 15 tablet 0   methocarbamol (ROBAXIN) 500 MG tablet Take 1 tablet (500 mg total) by mouth 2 (two) times daily. 30 tablet 0   Netarsudil-Latanoprost (ROCKLATAN) 0.02-0.005 % SOLN Apply to eye.     palbociclib (IBRANCE) 75 MG tablet TAKE 1 TABLET (75 MG TOTAL) BY MOUTH DAILY. TAKE FOR 21  DAYS ON, 7 DAYS OFF, REPEAT EVERY 28 DAYS. 21 tablet 6   pioglitazone (ACTOS) 15 MG tablet  Take 1 tablet (15 mg total) by mouth daily. 90 tablet 3   prednisoLONE acetate (PRED FORTE) 1 % ophthalmic suspension SMARTSIG:1 In Eye(s) 6 Times Daily     No current facility-administered medications for this visit.     OBJECTIVE: white woman in no acute distress  Vitals:   05/04/21 1403  BP: (!) 179/69  Pulse: 90  Resp: 18  Temp: 97.6 F (36.4 C)  SpO2: 100%    Wt Readings from Last 3 Encounters:  05/04/21 238 lb (108 kg)  01/26/21 241 lb 14.4 oz (109.7 kg)  12/29/20 240 lb (108.9 kg)   Body mass index is 40.85 kg/m.    ECOG FS:1 - Symptomatic but completely ambulatory  Sclerae unicteric, EOMs intact I do not palpate a thyroid mass No cervical or supraclavicular adenopathy Lungs no rales or rhonchi Heart regular rate and rhythm Abd soft, obese, nontender, positive bowel sounds MSK no focal spinal tenderness, no upper extremity lymphedema Neuro: nonfocal, well oriented, appropriate affect Breasts: The right breast has undergone lumpectomy followed by radiation with no evidence of disease recurrence.  Left breast and both axillae are benign.   LAB RESULTS:  CMP     Component Value Date/Time   NA 138 04/28/2021 0845   NA 140 05/29/2017 1254   K 4.5 04/28/2021 0845   K 4.4 05/29/2017 1254   CL 106 04/28/2021 0845   CO2 23 04/28/2021 0845   CO2 25 05/29/2017 1254   GLUCOSE 274 (H) 04/28/2021 0845   GLUCOSE 154 (H) 05/29/2017 1254   BUN 14 04/28/2021 0845   BUN 11.5 05/29/2017 1254   CREATININE 0.91 04/28/2021 0845   CREATININE 0.77 06/30/2020 1030   CREATININE 0.8 05/29/2017 1254   CALCIUM 8.9 04/28/2021 0845   CALCIUM 9.3 05/29/2017 1254   PROT 7.1 04/28/2021 0845   PROT 7.0 05/29/2017 1254   ALBUMIN 3.9 04/28/2021 0845   ALBUMIN 4.1 05/29/2017 1254   AST 11 (L) 04/28/2021 0845   AST 17 06/30/2020 1030   AST 13 05/29/2017 1254   ALT 14 04/28/2021 0845   ALT 14  06/30/2020 1030   ALT 14 05/29/2017 1254   ALKPHOS 86 04/28/2021 0845   ALKPHOS 66 05/29/2017 1254   BILITOT 0.5 04/28/2021 0845   BILITOT 0.5 06/30/2020 1030   BILITOT 0.50 05/29/2017 1254   GFRNONAA >60 04/28/2021 0845   GFRNONAA >60 06/30/2020 1030   GFRAA >60 03/10/2020 1138    INo results found for: SPEP, UPEP  Lab Results  Component Value Date   WBC 2.3 (L) 04/28/2021   NEUTROABS 1.5 (L) 04/28/2021   HGB 12.6 04/28/2021   HCT 37.8 04/28/2021   MCV 96.4 04/28/2021   PLT 169 04/28/2021      Chemistry      Component Value Date/Time   NA 138 04/28/2021 0845   NA 140 05/29/2017 1254   K 4.5 04/28/2021 0845   K 4.4 05/29/2017 1254   CL 106 04/28/2021 0845   CO2 23 04/28/2021 0845   CO2 25 05/29/2017 1254   BUN 14 04/28/2021 0845   BUN 11.5 05/29/2017 1254   CREATININE 0.91 04/28/2021 0845   CREATININE 0.77 06/30/2020 1030   CREATININE 0.8 05/29/2017 1254      Component Value Date/Time   CALCIUM 8.9 04/28/2021 0845   CALCIUM 9.3 05/29/2017 1254   ALKPHOS 86 04/28/2021 0845   ALKPHOS 66 05/29/2017 1254   AST 11 (L) 04/28/2021 0845   AST 17 06/30/2020 1030   AST 13 05/29/2017  1254   ALT 14 04/28/2021 0845   ALT 14 06/30/2020 1030   ALT 14 05/29/2017 1254   BILITOT 0.5 04/28/2021 0845   BILITOT 0.5 06/30/2020 1030   BILITOT 0.50 05/29/2017 1254       No results found for: LABCA2  No components found for: [id number]  No results for input(s): INR in the last 168 hours.  Urinalysis    Component Value Date/Time   COLORURINE YELLOW 08/18/2017 1322   APPEARANCEUR HAZY (A) 08/18/2017 1322   LABSPEC 1.010 08/18/2017 1322   PHURINE 7.0 08/18/2017 1322   GLUCOSEU NEGATIVE 08/18/2017 1322   GLUCOSEU NEGATIVE 10/30/2014 1513   HGBUR LARGE (A) 08/18/2017 1322   BILIRUBINUR NEGATIVE 08/18/2017 1322   KETONESUR NEGATIVE 08/18/2017 1322   PROTEINUR 30 (A) 08/18/2017 1322   UROBILINOGEN 0.2 10/30/2014 1513   NITRITE NEGATIVE 08/18/2017 1322   LEUKOCYTESUR LARGE  (A) 08/18/2017 1322    STUDIES: CT Chest W Contrast  Addendum Date: 04/29/2021   ADDENDUM REPORT: 04/29/2021 17:39 ADDENDUM: LEFT thyroid nodule greater than 2 cm as described. Recommend thyroid US (ref: J Am Coll Radiol. 2015 Feb;12(2): 143-50). Electronically Signed   By: Zetta Bills M.D.   On: 04/29/2021 17:39   Result Date: 04/29/2021 CLINICAL DATA:  Assess treatment response in a patient at age 9 with history of breast cancer. EXAM: CT CHEST WITH CONTRAST TECHNIQUE: Multidetector CT imaging of the chest was performed during intravenous contrast administration. CONTRAST:  89m OMNIPAQUE IOHEXOL 350 MG/ML SOLN COMPARISON:  September 18, 2020. FINDINGS: Cardiovascular: Calcified aortic atherosclerosis without aneurysm. Normal heart size without substantial pericardial effusion. Normal appearance of central pulmonary vasculature. Three-vessel coronary artery disease. Mediastinum/Nodes: LEFT thyroid nodule (image 11/2) 2.4 x 2.1 cm, unchanged compared to previous imaging. No adenopathy in the chest. Mildly patulous esophagus and small hiatal hernia as before. Lungs/Pleura: Pleural and parenchymal scarring in the RIGHT chest due to previous radiotherapy. No effusion. No lobar level consolidative changes. Airways are patent. Upper Abdomen: Marked intra and extrahepatic biliary duct dilation is again noted 2.3 cm anterior-posterior dimension of the central biliary tree at the level of the porta hepatis approximately 2 cm in May of 2019. Common bile duct remains distended 12-13 mm in the head of the pancreas as compared to 10 mm on more remote imaging. Intrahepatic biliary radicles are slightly more dilated as well. Signs of potential mild hepatic steatosis. No stranding about the visualized pancreas. Visualized spleen, adrenal glands and kidneys without change, smoothly marginated LEFT adrenal lesion at 1 cm is stable along with a low-density lesion in the upper pole the RIGHT kidney. Musculoskeletal: Post  RIGHT breast lumpectomy as before. IMPRESSION: No signs of metastatic disease to the chest. Post treatment changes about the RIGHT breast and in the RIGHT chest as described. Three-vessel coronary artery disease. Slight increased biliary duct distension following cholecystectomy. Biliary tree is chronically dilated and without visible lesion in the upper abdomen to explain these findings. More likely physiologic. If there are any changes in symptoms or laboratory abnormalities could consider further evaluation with MRCP as warranted. Stable appearance of other incidental findings in the upper abdomen. Electronically Signed: By: GZetta BillsM.D. On: 04/29/2021 10:16       ELIGIBLE FOR AVAILABLE RESEARCH PROTOCOL: no  ASSESSMENT: 75y.o. [place] woman with a remote history of early stage endometrial cancer, subsequently status post right breast upper outer quadrant biopsy 01/22/2016 for a clinically multifocal T2 N0, stage 2A invasive ductal carcinoma, grade 1, estrogen and progesterone  receptor positive, HER-2 negative, with an MIB-1 between 10 and 15%.  (1) right axillary lymph node biopsy 03/02/2016 positive  (2) genetics testing 01/13/2016 through the Custom gene panel offered by [facility] found no deleterious mutations in  ATM, BARD1, BRCA1, BRCA2, BRIP1, CDH1, CHEK2, EPCAM, FANCC, MLH1, MSH2, MSH6, MUTYH, NBN, PALB2, PMS2, POLD1, PTEN, RAD51C, RAD51D, TP53, and XRCC2  METASTATIC DISEASE: OCT 2017 (3) CT scans of the chest abdomen and pelvis obtained 03/10/2016 are consistent with bilateral lung metastases and mediastinal and hilar nodal involvement, but no liver or bone spread  (a) bronchoscopic lymph node biopsy 2 (station 7, 13R) 03/28/2016 confirms metastatic adenocarcinoma, estrogen receptor positive, HER-2 not amplified  (b) baseline CA-27-29 on 04/18/2016 was 137.5.  (4) letrozole started 03/15/2016, palbociclib added 03/29/2016 at 125 mg/day, 21/7  (a) dose decreased to 100 mg  per day, 21/7, beginning with February cycle  (b) palbociclib held 01/31/2017, with increasing symptoms  (c) palbociclib resumed October 2018 at 75 mg daily  (d) palbociclib dose reduced to 75 mg every other day February through April 2019  (e) palbociclib dose resumed at 75 mg daily as of 10/17/2017  (f) palbociclib dose decreased to 75 mg every other day beginning 07/31/2019  (g) palbociclib resumed at 75 mg daily, 21 days on 7 off, as of 08/25/2019  (5) status post double right lumpectomies and right axillary lymph node sampling 01/13/2017 for 2 separate invasive ductal carcinoma lesions, pT1a and pT1b, N1a, with negative margins, both lesions being estrogen and progesterone receptor positive and HER-2 negative  (6) adjuvant radiation completed 07/05/2017 1. 50.4 Gy in 28 fractions to the right breast and supraclavicular region using whole-breast tangent fields. 2. Boost to the seroma delivered an additional 10 Gy in 5 fractions. The total dose was 60.4 Gy.  (7) restaging studies:  (a) CT scan of the chest and bone scan 06/23/2017 showed stable scattered very small lung nodules, no bone lesions  (b) CT of the chest 10/17/2017 showed no new or progressive metastatic disease in the chest. The small left lower lobe pulmonary nodule is stable  (c) PET scan on 03/02/2018: shows no findings for residual or recurrent right breast cancer  (d) chest CT scan stable, questionable right renal cyst noted  (e) chest CT 09/18/2020 shows no measurable disease  (f) to the CT scan 04/29/2021 shows no evidence of active disease; a 2 cm thyroid nodule was noted   (8) right upper pole renal lesion noted to be enlarging on CT scan 06/22/2017  (a) no uptake on PET scan obtained 03/02/2018   PLAN: Sidni is now 5 years out from definitive diagnosis of metastatic breast cancer with no evidence of disease activity.  This is very favorable.  She is tolerating treatment remarkably well and we are continuing the  letrozole and palbociclib as before.  We still have 1 more level of letrozole if her counts drop, namely we could go to 75 mg every other day.  After that if there is further marrow compromise we could consider switching to abemaciclib or a different agents  As usual Guerline wanted to understand every medical term in her CT scan and we went over that in detail.  I have set her up for an ultrasound of the thyroid and if necessary we will follow that with a biopsy but I reassured her that I do not expect that to show was thyroid cancer--it is much more likely to be a goiter.  She will return in 4 weeks for labs  and in 8 weeks for labs and a visit.  She knows to call for any other issue that may develop before then.  Total encounter time 35 minutes.*  Mohammad Granade, Virgie Dad, MD  05/04/21 3:35 PM Medical Oncology and Hematology [facility] [place], [place] [address] Tel. [phone]    Fax. [phone]   I, Wilburn Mylar, am acting as scribe for Dr. Virgie Dad. Breland Trouten.  I, Lurline Del MD, have reviewed the above documentation for accuracy and completeness, and I agree with the above.   *Total Encounter Time as defined by the Centers for Medicare and Medicaid Services includes, in addition to the face-to-face time of a patient visit (documented in the note above) non-face-to-face time: obtaining and reviewing outside history, ordering and reviewing medications, tests or procedures, care coordination (communications with other health care professionals or caregivers) and documentation in the medical record.

## 2021-05-04 ENCOUNTER — Inpatient Hospital Stay: Payer: Medicare Other

## 2021-05-04 ENCOUNTER — Other Ambulatory Visit: Payer: Self-pay

## 2021-05-04 ENCOUNTER — Inpatient Hospital Stay (HOSPITAL_BASED_OUTPATIENT_CLINIC_OR_DEPARTMENT_OTHER): Payer: Medicare Other | Admitting: Oncology

## 2021-05-04 VITALS — BP 179/69 | HR 90 | Temp 97.6°F | Resp 18 | Ht 64.0 in | Wt 238.0 lb

## 2021-05-04 DIAGNOSIS — Z923 Personal history of irradiation: Secondary | ICD-10-CM | POA: Diagnosis not present

## 2021-05-04 DIAGNOSIS — C50411 Malignant neoplasm of upper-outer quadrant of right female breast: Secondary | ICD-10-CM

## 2021-05-04 DIAGNOSIS — C7800 Secondary malignant neoplasm of unspecified lung: Secondary | ICD-10-CM

## 2021-05-04 DIAGNOSIS — Z79811 Long term (current) use of aromatase inhibitors: Secondary | ICD-10-CM | POA: Diagnosis not present

## 2021-05-04 DIAGNOSIS — Z170 Estrogen receptor positive status [ER+]: Secondary | ICD-10-CM | POA: Diagnosis not present

## 2021-05-11 ENCOUNTER — Ambulatory Visit (HOSPITAL_COMMUNITY)
Admission: RE | Admit: 2021-05-11 | Discharge: 2021-05-11 | Disposition: A | Discharge status: Home / Self Care | Payer: Medicare Other | Source: Ambulatory Visit | Attending: Oncology | Admitting: Oncology

## 2021-05-11 ENCOUNTER — Other Ambulatory Visit: Payer: Self-pay

## 2021-05-11 DIAGNOSIS — C50411 Malignant neoplasm of upper-outer quadrant of right female breast: Secondary | ICD-10-CM | POA: Insufficient documentation

## 2021-05-11 DIAGNOSIS — Z170 Estrogen receptor positive status [ER+]: Secondary | ICD-10-CM | POA: Insufficient documentation

## 2021-05-11 DIAGNOSIS — E041 Nontoxic single thyroid nodule: Secondary | ICD-10-CM | POA: Diagnosis not present

## 2021-05-11 DIAGNOSIS — C7800 Secondary malignant neoplasm of unspecified lung: Secondary | ICD-10-CM | POA: Insufficient documentation

## 2021-05-12 ENCOUNTER — Encounter: Payer: Self-pay | Admitting: Oncology

## 2021-05-19 DIAGNOSIS — H35351 Cystoid macular degeneration, right eye: Secondary | ICD-10-CM | POA: Diagnosis not present

## 2021-05-19 DIAGNOSIS — H04123 Dry eye syndrome of bilateral lacrimal glands: Secondary | ICD-10-CM | POA: Diagnosis not present

## 2021-05-19 DIAGNOSIS — H20013 Primary iridocyclitis, bilateral: Secondary | ICD-10-CM | POA: Diagnosis not present

## 2021-05-24 DIAGNOSIS — Z961 Presence of intraocular lens: Secondary | ICD-10-CM | POA: Diagnosis not present

## 2021-05-24 DIAGNOSIS — H401134 Primary open-angle glaucoma, bilateral, indeterminate stage: Secondary | ICD-10-CM | POA: Diagnosis not present

## 2021-05-24 DIAGNOSIS — E119 Type 2 diabetes mellitus without complications: Secondary | ICD-10-CM | POA: Diagnosis not present

## 2021-05-24 DIAGNOSIS — H04123 Dry eye syndrome of bilateral lacrimal glands: Secondary | ICD-10-CM | POA: Diagnosis not present

## 2021-06-09 ENCOUNTER — Ambulatory Visit (INDEPENDENT_AMBULATORY_CARE_PROVIDER_SITE_OTHER): Payer: Medicare Other | Admitting: Internal Medicine

## 2021-06-09 ENCOUNTER — Encounter: Payer: Self-pay | Admitting: Internal Medicine

## 2021-06-09 ENCOUNTER — Other Ambulatory Visit: Payer: Self-pay

## 2021-06-09 VITALS — BP 126/68 | HR 81 | Ht 64.0 in | Wt 238.0 lb

## 2021-06-09 DIAGNOSIS — E782 Mixed hyperlipidemia: Secondary | ICD-10-CM

## 2021-06-09 DIAGNOSIS — I10 Essential (primary) hypertension: Secondary | ICD-10-CM | POA: Diagnosis not present

## 2021-06-09 DIAGNOSIS — F418 Other specified anxiety disorders: Secondary | ICD-10-CM | POA: Diagnosis not present

## 2021-06-09 DIAGNOSIS — E1165 Type 2 diabetes mellitus with hyperglycemia: Secondary | ICD-10-CM | POA: Diagnosis not present

## 2021-06-09 LAB — POCT GLYCOSYLATED HEMOGLOBIN (HGB A1C): Hemoglobin A1C: 8.9 % — AB (ref 4.0–5.6)

## 2021-06-09 MED ORDER — DAPAGLIFLOZIN PROPANEDIOL 10 MG PO TABS: 10.0000 mg | ORAL_TABLET | Freq: Every day | ORAL | 3 refills | Status: DC

## 2021-06-09 MED ORDER — ROSUVASTATIN CALCIUM 20 MG PO TABS: 20.0000 mg | ORAL_TABLET | Freq: Every day | ORAL | 3 refills | Status: DC

## 2021-06-09 NOTE — Assessment & Plan Note (Signed)
Lab Results  Component Value Date   HGBA1C 8.9 (A) 06/09/2021  uncontrolled, ok for change actos to farxiga 10 qd if ok with insurance coverage, consider dexcom if covered,  F/u lab next visit

## 2021-06-09 NOTE — Assessment & Plan Note (Signed)
Declines to take low dose lisinopril 5 qd - ok to d/c for now

## 2021-06-09 NOTE — Patient Instructions (Addendum)
Ok to STOP the lisinopril, lovastatin and pioglitazone  Please take all new medication as prescribed - the Crestor 20 mg per day, and the Farxiga 10 mg per day  Please continue all other medications as before, and refills have been done if requested.  Please have the pharmacy call with any other refills you may need.  Please continue your efforts at being more active, low cholesterol diet, and weight control  Please keep your appointments with your specialists as you may have planned  Please make an Appointment to return in Jul 28, 2021, or sooner if needed

## 2021-06-09 NOTE — Assessment & Plan Note (Signed)
Lab Results  Component Value Date   LDLCALC 117 (H) 04/29/2015   Uncontrolled,, pt to change current statin lovastatin to crestor 20 qd, low chol diet f/u lab next visit

## 2021-06-09 NOTE — Progress Notes (Signed)
Patient ID: [name], female   DOB: 1945-12-10, 75 y.o.   MRN: [id number]        Chief Complaint: follow up frustrations over nonspecific symptoms and not feeling well       HPI:  [name] is a 75 y.o. female here with c/o persistent just not feeling well, including recurring HAs, dizzy, fatigue and irritable (added per husband with her) and wants her chronic meds changed hoping to change her fortune, since she cannot change the ibrance.   Pt denies chest pain, increased sob or doe, wheezing, orthopnea, PND, increased LE swelling, palpitations, dizziness or syncope.   Pt denies polydipsia, polyuria, or new focal neuro s/s.   Pt denies fever, wt loss, night sweats, loss of appetite, or other constitutional symptoms Denies worsening depressive symptoms, suicidal ideation, or panic; has ongoing anxiety       Wt Readings from Last 3 Encounters:  06/09/21 238 lb (108 kg)  05/04/21 238 lb (108 kg)  01/26/21 241 lb 14.4 oz (109.7 kg)   BP Readings from Last 3 Encounters:  06/09/21 126/68  05/04/21 (!) 179/69  03/18/21 (!) 172/88         Past Medical History:  Diagnosis Date   Breast cancer ([place])    Cancer ([place]) 02/2016   right breast   DIABETES MELLITUS, TYPE II 01/04/2007   only takes actoplus daily   Dizziness and giddiness 02/29/2008   DVT, HX OF    at age 67 in right buttocks   Dyspnea    due to lung cancer   Family history of breast cancer    GERD 01/04/2007   pt reports resolved    GLAUCOMA 07/30/2008   both eyes   History of blood transfusion    no abnormal  reaction   History of uterine cancer 2000   hysterectomy done   HYPERLIPIDEMIA 01/04/2007   taking Pravastatin daily   HYPERTENSION 01/04/2007   takes Lisinopril daily   Joint pain    Joint swelling    Leg cramps    LEG PAIN, LEFT 07/06/2007   NUMBNESS 07/30/2008   in fingers;pt states from [facility]   OSTEOARTHRITIS, HIP 09/25/2009   OTITIS MEDIA, ACUTE, BILATERAL 02/29/2008   Overweight(278.02) 01/04/2007    Peripheral vascular disease ([place])    Personal history of radiation therapy 2018   Pneumonia    PONV (postoperative nausea and vomiting)    SLEEP APNEA, OBSTRUCTIVE    doesn't use a cpap;study done about 7yrs ago   [place], HX OF 01/04/2007   Vision loss    left eye   Past Surgical History:  Procedure Laterality Date   ABDOMINAL HYSTERECTOMY  2000   BREAST LUMPECTOMY Right 01/13/2017   x2   BREAST LUMPECTOMY WITH RADIOACTIVE SEED AND SENTINEL LYMPH NODE BIOPSY Right 01/13/2017   Procedure: RIGHT BREAST RADIOACTIVE SEED X'S 2 GUIDED LUMPECTOMY WITH RADIOACTIVE SEED TARGETED AXILLARYLYMPH NODE EXCISION AND RIGHT AXILLARY SENTINEL LYMPH NODE BIOPSY;  Surgeon: Alphonsa Overall, MD;  Location: [facility];  Service: General;  Laterality: Right;  2 SEEDS IN RIGHT BREAST 1 SEED IN RIGHT AXILLARY NODE   CHOLECYSTECTOMY     ENDOBRONCHIAL ULTRASOUND Bilateral 03/28/2016   Procedure: ENDOBRONCHIAL ULTRASOUND;  Surgeon: Collene Gobble, MD;  Location: [facility];  Service: Cardiopulmonary;  Laterality: Bilateral;   EYE SURGERY  13   shunt left and lazer eye surgery on right cataract and retenia tear with repair   growth removal  2004   from thumb  KNEE ARTHROSCOPY Right    mulitple eye surgeries     both eyes, cataracts with ioc done both eyes   OOPHORECTOMY     right lumpectomy with axillary node dissection Right 01/2017   TOTAL HIP ARTHROPLASTY  06/24/2011   Procedure: TOTAL HIP ARTHROPLASTY;  Surgeon: Kerin Salen;  Location: [facility];  Service: Orthopedics;  Laterality: Right;   TOTAL HIP ARTHROPLASTY Left 11/12/2012   Dr Mayer Camel   TOTAL HIP ARTHROPLASTY Left 11/12/2012   Procedure: TOTAL HIP ARTHROPLASTY;  Surgeon: Kerin Salen, MD;  Location: [facility];  Service: Orthopedics;  Laterality: Left;  DEPUY PINNACLE    reports that she has never smoked. She has never used smokeless tobacco. She reports that she does not drink alcohol and does not use drugs. family history includes Breast  cancer (age of onset: 61) in her sister; Cancer in her mother; Dementia in her mother; Glaucoma in her maternal grandfather; Heart attack in her maternal aunt; Lung cancer in her maternal grandmother; Stroke in her sister. Allergies  Allergen Reactions   Codeine Hives    Hycodan syrup   Fluorescein Nausea And Vomiting    ? IV dye for retina specialist   Lipitor [Atorvastatin Calcium]     Leg cramp   Oxycodone Nausea And Vomiting    Patient vomited for 3 days after taking   Sitagliptin Phosphate Nausea And Vomiting   Sulfa Drugs Cross Reactors Nausea And Vomiting   Current Outpatient Medications on File Prior to Visit  Medication Sig Dispense Refill   acetaminophen (TYLENOL) 500 MG tablet Take 1,000 mg by mouth every 4 (four) hours as needed for moderate pain or fever.     aspirin EC 81 MG tablet Take 81 mg by mouth daily at 6 PM. 1700     Biotin 1 MG CAPS Take by mouth.     brimonidine (ALPHAGAN) 0.2 % ophthalmic solution INSTILL 1 DROP INTO EACH EYE THREE TIMES DAILY     cholecalciferol (VITAMIN D) 1000 units tablet Take 1,000 Units by mouth daily.     dorzolamide-timolol (COSOPT) 22.3-6.8 MG/ML ophthalmic solution Place 1 drop into both eyes 2 (two) times daily.     ketorolac (ACULAR) 0.5 % ophthalmic solution SMARTSIG:In Eye(s)     letrozole (FEMARA) 2.5 MG tablet TAKE 1 TABLET BY MOUTH AT BEDTIME. 90 tablet 4   Netarsudil-Latanoprost (ROCKLATAN) 0.02-0.005 % SOLN Apply to eye.     palbociclib (IBRANCE) 75 MG tablet TAKE 1 TABLET (75 MG TOTAL) BY MOUTH DAILY. TAKE FOR 21 DAYS ON, 7 DAYS OFF, REPEAT EVERY 28 DAYS. 21 tablet 6   prednisoLONE acetate (PRED FORTE) 1 % ophthalmic suspension SMARTSIG:1 In Eye(s) 6 Times Daily     brimonidine-timolol (COMBIGAN) 0.2-0.5 % ophthalmic solution Place 1 drop into both eyes three times daily (Patient not taking: Reported on 06/09/2021)     meloxicam (MOBIC) 7.5 MG tablet Take 1 tablet (7.5 mg total) by mouth daily. (Patient not taking: Reported on  06/09/2021) 15 tablet 0   methocarbamol (ROBAXIN) 500 MG tablet Take 1 tablet (500 mg total) by mouth 2 (two) times daily. (Patient not taking: Reported on 06/09/2021) 30 tablet 0   No current facility-administered medications on file prior to visit.        ROS:  All others reviewed and negative.  Objective        PE:  BP 126/68 (BP Location: Left Arm, Patient Position: Sitting, Cuff Size: Large)    Pulse 81    Ht 5'  4" (1.626 m)    Wt 238 lb (108 kg)    SpO2 97%    BMI 40.85 kg/m                 Constitutional: Pt appears in NADl, obese               HENT: Head: NCAT.                Right Ear: External ear normal.                 Left Ear: External ear normal.                Eyes: . Pupils are equal, round, and reactive to light. Conjunctivae and EOM are normal               Nose: without d/c or deformity               Neck: Neck supple. Gross normal ROM               Cardiovascular: Normal rate and regular rhythm.                 Pulmonary/Chest: Effort normal and breath sounds without rales or wheezing.                Abd:  Soft, NT, ND, + BS, no organomegaly               Neurological: Pt is alert. At baseline orientation, motor grossly intact               Skin: Skin is warm. No rashes, no other new lesions, LE edema - none               Psychiatric: Pt behavior is normal without agitation , nervous, irritable  Micro: none  Cardiac tracings I have personally interpreted today:  none  Pertinent Radiological findings (summarize): none   Lab Results  Component Value Date   WBC 2.3 (L) 04/28/2021   HGB 12.6 04/28/2021   HCT 37.8 04/28/2021   PLT 169 04/28/2021   GLUCOSE 274 (H) 04/28/2021   CHOL 195 04/29/2015   TRIG 129.0 04/29/2015   HDL 52.40 04/29/2015   LDLCALC 117 (H) 04/29/2015   ALT 14 04/28/2021   AST 11 (L) 04/28/2021   NA 138 04/28/2021   K 4.5 04/28/2021   CL 106 04/28/2021   CREATININE 0.91 04/28/2021   BUN 14 04/28/2021   CO2 23 04/28/2021   TSH 1.172  09/24/2018   INR 1.06 11/08/2012   HGBA1C 8.9 (A) 06/09/2021   MICROALBUR <0.7 10/30/2014   Hemoglobin A1C 4.0 - 5.6 % 8.9 Abnormal   8.7 Abnormal   6.7 Abnormal   6.7 Abnormal   6.7 R  6.7 High  R, CM  7.3 High    Assessment/Plan:  [name] is a 75 y.o. White or Caucasian [1] female with  has a past medical history of Breast cancer ([place]), Cancer ([place]) (02/2016), DIABETES MELLITUS, TYPE II (01/04/2007), Dizziness and giddiness (02/29/2008), DVT, HX OF, Dyspnea, Family history of breast cancer, GERD (01/04/2007), GLAUCOMA (07/30/2008), History of blood transfusion, History of uterine cancer (2000), HYPERLIPIDEMIA (01/04/2007), HYPERTENSION (01/04/2007), Joint pain, Joint swelling, Leg cramps, LEG PAIN, LEFT (07/06/2007), NUMBNESS (07/30/2008), OSTEOARTHRITIS, HIP (09/25/2009), OTITIS MEDIA, ACUTE, BILATERAL (02/29/2008), Overweight(278.02) (01/04/2007), Peripheral vascular disease ([place]), Personal history of radiation therapy (2018), Pneumonia, PONV (postoperative nausea and vomiting), SLEEP APNEA, OBSTRUCTIVE, TRANSIENT ISCHEMIC ATTACK,  HX OF (01/04/2007), and Vision loss.  Diabetes Lab Results  Component Value Date   HGBA1C 8.9 (A) 06/09/2021  uncontrolled, ok for change actos to farxiga 10 qd if ok with insurance coverage, consider dexcom if covered,  F/u lab next visit  Hyperlipidemia Lab Results  Component Value Date   LDLCALC 117 (H) 04/29/2015   Uncontrolled,, pt to change current statin lovastatin to crestor 20 qd, low chol diet f/u lab next visit   Depression with anxiety Mild worsening recently, declines change in tx or counseling referral  Essential hypertension Declines to take low dose lisinopril 5 qd - ok to d/c for now  Followup: Return in about 7 weeks (around 07/28/2021).  Cathlean Cower, MD 06/09/2021 8:01 PM [facility]

## 2021-06-09 NOTE — Assessment & Plan Note (Signed)
Mild worsening recently, declines change in tx or counseling referral

## 2021-06-22 NOTE — Telephone Encounter (Signed)
Patient is approved for [employer] at no cost from [employer] 06/21/21-06/12/22.   [facility]ross Patient [place] Phone [phone] Fax [phone] 06/22/2021 5:30 PM

## 2021-06-28 ENCOUNTER — Other Ambulatory Visit: Payer: Self-pay | Admitting: *Deleted

## 2021-06-28 DIAGNOSIS — Z170 Estrogen receptor positive status [ER+]: Secondary | ICD-10-CM

## 2021-06-28 DIAGNOSIS — C7800 Secondary malignant neoplasm of unspecified lung: Secondary | ICD-10-CM

## 2021-06-28 DIAGNOSIS — C50411 Malignant neoplasm of upper-outer quadrant of right female breast: Secondary | ICD-10-CM

## 2021-06-29 ENCOUNTER — Inpatient Hospital Stay (HOSPITAL_BASED_OUTPATIENT_CLINIC_OR_DEPARTMENT_OTHER): Payer: PPO | Admitting: Hematology and Oncology

## 2021-06-29 ENCOUNTER — Inpatient Hospital Stay: Payer: PPO | Attending: Oncology

## 2021-06-29 ENCOUNTER — Other Ambulatory Visit: Payer: Self-pay

## 2021-06-29 ENCOUNTER — Encounter: Payer: Self-pay | Admitting: Hematology and Oncology

## 2021-06-29 VITALS — BP 177/63 | HR 65 | Temp 97.7°F | Resp 18 | Ht 64.0 in | Wt 239.4 lb

## 2021-06-29 DIAGNOSIS — C7800 Secondary malignant neoplasm of unspecified lung: Secondary | ICD-10-CM

## 2021-06-29 DIAGNOSIS — Z803 Family history of malignant neoplasm of breast: Secondary | ICD-10-CM | POA: Diagnosis not present

## 2021-06-29 DIAGNOSIS — E119 Type 2 diabetes mellitus without complications: Secondary | ICD-10-CM | POA: Diagnosis not present

## 2021-06-29 DIAGNOSIS — Z90710 Acquired absence of both cervix and uterus: Secondary | ICD-10-CM | POA: Diagnosis not present

## 2021-06-29 DIAGNOSIS — D72819 Decreased white blood cell count, unspecified: Secondary | ICD-10-CM | POA: Diagnosis not present

## 2021-06-29 DIAGNOSIS — D7289 Other specified disorders of white blood cells: Secondary | ICD-10-CM | POA: Diagnosis present

## 2021-06-29 DIAGNOSIS — R590 Localized enlarged lymph nodes: Secondary | ICD-10-CM

## 2021-06-29 DIAGNOSIS — I10 Essential (primary) hypertension: Secondary | ICD-10-CM | POA: Diagnosis not present

## 2021-06-29 DIAGNOSIS — Z853 Personal history of malignant neoplasm of breast: Secondary | ICD-10-CM | POA: Diagnosis not present

## 2021-06-29 DIAGNOSIS — C50411 Malignant neoplasm of upper-outer quadrant of right female breast: Secondary | ICD-10-CM

## 2021-06-29 DIAGNOSIS — E041 Nontoxic single thyroid nodule: Secondary | ICD-10-CM | POA: Diagnosis not present

## 2021-06-29 DIAGNOSIS — Z801 Family history of malignant neoplasm of trachea, bronchus and lung: Secondary | ICD-10-CM | POA: Insufficient documentation

## 2021-06-29 LAB — CBC WITH DIFFERENTIAL (CANCER CENTER ONLY)
Abs Immature Granulocytes: 0.01 10*3/uL (ref 0.00–0.07)
Basophils Absolute: 0 10*3/uL (ref 0.0–0.1)
Basophils Relative: 1 %
Eosinophils Absolute: 0 10*3/uL (ref 0.0–0.5)
Eosinophils Relative: 1 %
HCT: 36.7 % (ref 36.0–46.0)
Hemoglobin: 12.8 g/dL (ref 12.0–15.0)
Immature Granulocytes: 0 %
Lymphocytes Relative: 33 %
Lymphs Abs: 0.8 10*3/uL (ref 0.7–4.0)
MCH: 32.7 pg (ref 26.0–34.0)
MCHC: 34.9 g/dL (ref 30.0–36.0)
MCV: 93.6 fL (ref 80.0–100.0)
Monocytes Absolute: 0.3 10*3/uL (ref 0.1–1.0)
Monocytes Relative: 15 %
Neutro Abs: 1.1 10*3/uL — ABNORMAL LOW (ref 1.7–7.7)
Neutrophils Relative %: 50 %
Platelet Count: 135 10*3/uL — ABNORMAL LOW (ref 150–400)
RBC: 3.92 MIL/uL (ref 3.87–5.11)
RDW: 14.7 % (ref 11.5–15.5)
WBC Count: 2.3 10*3/uL — ABNORMAL LOW (ref 4.0–10.5)
nRBC: 0 % (ref 0.0–0.2)

## 2021-06-29 LAB — CMP (CANCER CENTER ONLY)
ALT: 13 U/L (ref 0–44)
AST: 12 U/L — ABNORMAL LOW (ref 15–41)
Albumin: 4 g/dL (ref 3.5–5.0)
Alkaline Phosphatase: 89 U/L (ref 38–126)
Anion gap: 8 (ref 5–15)
BUN: 19 mg/dL (ref 8–23)
CO2: 25 mmol/L (ref 22–32)
Calcium: 9.1 mg/dL (ref 8.9–10.3)
Chloride: 104 mmol/L (ref 98–111)
Creatinine: 0.69 mg/dL (ref 0.44–1.00)
GFR, Estimated: >60 mL/min (ref >60)
Glucose, Bld: 256 mg/dL — ABNORMAL HIGH (ref 70–99)
Potassium: 4.3 mmol/L (ref 3.5–5.1)
Sodium: 137 mmol/L (ref 135–145)
Total Bilirubin: 0.5 mg/dL (ref 0.3–1.2)
Total Protein: 6.8 g/dL (ref 6.5–8.1)

## 2021-06-29 NOTE — Progress Notes (Signed)
[place]  Telephone:([phone] Fax:([phone]) [phone]     ID: [name] DOB: [birth year]  MR#: [id number]  IDP#:[id number]  Patient Care Team: Biagio Borg, MD as PCP - General (Internal Medicine) Alphonsa Overall, MD as Consulting Physician (General Surgery) Magrinat, Virgie Dad, MD as Consulting Physician (Oncology) Kyung Rudd, MD as Consulting Physician (Radiation Oncology) Bobbye Charleston, MD as Consulting Physician (Obstetrics and Gynecology) Nada Libman, MD as Referring Physician (Specialist) Collene Gobble, MD as Consulting Physician (Pulmonary Disease) Alexis Frock, MD as Consulting Physician (Urology) Ander Slade, Carlisle Beers, MD as Referring Physician (Ophthalmology) Raina Mina, RPH-CPP (Pharmacist) OTHER MD:   CHIEF COMPLAINT: Estrogen receptor positive breast cancer  CURRENT TREATMENT: Letrozole, palbociclib   INTERVAL HISTORY: [name] returns today for follow-up of her estrogen receptor positive stage IV breast cancer.  She is accompanied by her husband [name].  She was following up with Dr. Jana Hakim every 3 months.  She had restaging imaging back in November which showed no signs of metastatic disease, left thyroid nodule.  She had ultrasound of the thyroid nodule which showed TI RADS 4 thyroid nodule and annual ultrasound was recommended.  Since last visit, she has been taking Ibrance and letrozole as recommended.  She does report ongoing hair thinning and some brittle nails.  She denies any major change in breathing, bowel or urinary habits.  She denies any new neurological complaints.  She was worried about taking farxiga which was prescribed to her by her PCP since there were some kidney related adverse effects reported on the package insert.  Rest of the pertinent 10 point ROS reviewed and negative.  We are continuing to follow her tumor marker: Lab Results  Component Value Date   CA2729 43.1 (H) 04/28/2021   CA2729 37.2 01/26/2021    CA2729 32.3 09/18/2020   CA2729 32.1 06/30/2020   CA2729 40.4 (H) 04/07/2020    REVIEW OF SYSTEMS:   A detailed review of systems today was otherwise stable.   COVID 19 VACCINATION STATUS: Status post Pfizer x2 followed by booster August 2021; had COVID November 2022   BREAST CANCER HISTORY: From the original intake note:  [name] had screening mammography showing some suspicious calcifications in the right breast leading to right diagnostic mammography with ultrasonography 01/22/2016 at [facility]. The breast density was category C. In the upper right breast there was a 2.3 cm mass with additional masses measuring 0.9 and 0.7 cm. There was also a possible additional 0.8 mass in the lower inner quadrant. Ultrasound confirmed an irregular hypoechoic mass in the right breast upper outer quadrant measuring 2.0 cm. There were other masses measuring 0.7 and 0.8 cm by ultrasonography. The right axilla was sonographically benign.  Biopsy of a 12:00 and 4:00 mass in the right breast 01/22/2016 showed ([id number]) both specimens showing invasive ductal carcinoma, grade 1 or 2, both 95% estrogen receptor positive, both 95% progesterone receptor positive, both with strong staining intensity, with MIB-1 ranging from 10-15%, and both HER-2 negative, the signals ratio being 1.23-1.42, and the number per cell 1.85-2.59.  Her subsequent history is as detailed below   PAST MEDICAL HISTORY: Past Medical History:  Diagnosis Date   Breast cancer ([place])    Cancer ([place]) 02/2016   right breast   DIABETES MELLITUS, TYPE II 01/04/2007   only takes actoplus daily   Dizziness and giddiness 02/29/2008   DVT, HX OF    at age 52 in right buttocks   Dyspnea    due  to lung cancer   Family history of breast cancer    GERD 01/04/2007   pt reports resolved    GLAUCOMA 07/30/2008   both eyes   History of blood transfusion    no abnormal  reaction   History of uterine cancer 2000   hysterectomy done   HYPERLIPIDEMIA  01/04/2007   taking Pravastatin daily   HYPERTENSION 01/04/2007   takes Lisinopril daily   Joint pain    Joint swelling    Leg cramps    LEG PAIN, LEFT 07/06/2007   NUMBNESS 07/30/2008   in fingers;pt states from Diam[facility]   OSTEOARTHRITIS, HIP 09/25/2009   OTITIS MEDIA, ACUTE, BILATERAL 02/29/2008   Overweight(278.02) 01/04/2007   Peripheral vascular disease ([place])    Personal history of radiation therapy 2018   Pneumonia    PONV (postoperative nausea and vomiting)    SLEEP APNEA, OBSTRUCTIVE    doesn't use a cpap;study done about 58yr ago   [place] HX OF 01/04/2007   Vision loss    left eye    PAST SURGICAL HISTORY: Past Surgical History:  Procedure Laterality Date   ABDOMINAL HYSTERECTOMY  2000   BREAST LUMPECTOMY Right 01/13/2017   x2   BREAST LUMPECTOMY WITH RADIOACTIVE SEED AND SENTINEL LYMPH NODE BIOPSY Right 01/13/2017   Procedure: RIGHT BREAST RADIOACTIVE SEED X'S 2 GUIDED LUMPECTOMY WITH RADIOACTIVE SEED TARGETED AXILLARYLYMPH NODE EXCISION AND RIGHT AXILLARY SENTINEL LYMPH NODE BIOPSY;  Surgeon: NAlphonsa Overall MD;  [facility]  Service: General;  Laterality: Right;  2 SEEDS IN RIGHT BREAST 1 SEED IN RIGHT AXILLARY NODE   CHOLECYSTECTOMY     ENDOBRONCHIAL ULTRASOUND Bilateral 03/28/2016   Procedure: ENDOBRONCHIAL ULTRASOUND;  Surgeon: RCollene Gobble MD;  Location: [facility];  Service: Cardiopulmonary;  Laterality: Bilateral;   EYE SURGERY  13   shunt left and lazer eye surgery on right cataract and retenia tear with repair   growth removal  2004   from thumb   KNEE ARTHROSCOPY Right    mulitple eye surgeries     both eyes, cataracts with ioc done both eyes   OOPHORECTOMY     right lumpectomy with axillary node dissection Right 01/2017   TOTAL HIP ARTHROPLASTY  06/24/2011   Procedure: TOTAL HIP ARTHROPLASTY;  Surgeon: FKerin Salen  [facility]  Service: Orthopedics;  Laterality: Right;   TOTAL HIP ARTHROPLASTY Left 11/12/2012   Dr RMayer Camel   TOTAL HIP ARTHROPLASTY Left 11/12/2012   Procedure: TOTAL HIP ARTHROPLASTY;  Surgeon: FKerin Salen MD;  [facility]  Service: Orthopedics;  Laterality: Left;  DEPUY PINNACLE    FAMILY HISTORY Family History  Problem Relation Age of Onset   Dementia Mother    Cancer Mother        Breast and lung cancer   Stroke Sister    Breast cancer Sister 530  Heart attack Maternal Aunt    Lung cancer Maternal Grandmother        non smoker   Glaucoma Maternal Grandfather    Anesthesia problems Neg Hx   The patient's father died at age 76 the patient's mother died at age 76 She had breast and lung cancers diagnosed shortly before her death. The patient had no brothers, 2 sisters. One sister was diagnosed with breast cancer at the age of 576   GYNECOLOGIC HISTORY:  No LMP recorded. Patient has had a hysterectomy. Menarche age 76 first live birth age 76 the patient is GX P1. She had  a hysterectomy for endometrial cancer in the year 2000. She did not take hormone replacement. She did use oral contraceptives for more than 20 years remotely, with no complications.   SOCIAL HISTORY: (Updated July 2021). 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 She is home with her husband [name]. He is a retired Dealer.Their son [name] also lives in [place].  The patient has 3 grandchildren aged 63, 66 and 62    ADVANCED DIRECTIVES: In place   HEALTH MAINTENANCE: Social History   Tobacco Use   Smoking status: Never   Smokeless tobacco: Never  Vaping Use   Vaping Use: Never used  Substance Use Topics   Alcohol use: No   Drug use: No     Colonoscopy: Never  PAP: Status post hysterectomy  Bone density: Remote   Allergies  Allergen Reactions   Codeine Hives    Hycodan syrup   Fluorescein Nausea And Vomiting    ? IV dye for retina specialist   Lipitor [Atorvastatin Calcium]     Leg cramp   Oxycodone Nausea And Vomiting     Patient vomited for 3 days after taking   Sitagliptin Phosphate Nausea And Vomiting   Sulfa Drugs Cross Reactors Nausea And Vomiting    Current Outpatient Medications  Medication Sig Dispense Refill   acetaminophen (TYLENOL) 500 MG tablet Take 1,000 mg by mouth every 4 (four) hours as needed for moderate pain or fever.     aspirin EC 81 MG tablet Take 81 mg by mouth daily at 6 PM. 1700     Biotin 1 MG CAPS Take by mouth.     brimonidine (ALPHAGAN) 0.2 % ophthalmic solution INSTILL 1 DROP INTO EACH EYE THREE TIMES DAILY     brimonidine-timolol (COMBIGAN) 0.2-0.5 % ophthalmic solution Place 1 drop into both eyes three times daily (Patient not taking: Reported on 06/09/2021)     cholecalciferol (VITAMIN D) 1000 units tablet Take 1,000 Units by mouth daily.     dapagliflozin propanediol (FARXIGA) 10 MG TABS tablet Take 1 tablet (10 mg total) by mouth daily. 90 tablet 3   dorzolamide-timolol (COSOPT) 22.3-6.8 MG/ML ophthalmic solution Place 1 drop into both eyes 2 (two) times daily.     ketorolac (ACULAR) 0.5 % ophthalmic solution SMARTSIG:In Eye(s)     letrozole (FEMARA) 2.5 MG tablet TAKE 1 TABLET BY MOUTH AT BEDTIME. 90 tablet 4   meloxicam (MOBIC) 7.5 MG tablet Take 1 tablet (7.5 mg total) by mouth daily. (Patient not taking: Reported on 06/09/2021) 15 tablet 0   methocarbamol (ROBAXIN) 500 MG tablet Take 1 tablet (500 mg total) by mouth 2 (two) times daily. (Patient not taking: Reported on 06/09/2021) 30 tablet 0   Netarsudil-Latanoprost (ROCKLATAN) 0.02-0.005 % SOLN Apply to eye.     palbociclib (IBRANCE) 75 MG tablet TAKE 1 TABLET (75 MG TOTAL) BY MOUTH DAILY. TAKE FOR 21 DAYS ON, 7 DAYS OFF, REPEAT EVERY 28 DAYS. 21 tablet 6   prednisoLONE acetate (PRED FORTE) 1 % ophthalmic suspension SMARTSIG:1 In Eye(s) 6 Times Daily     rosuvastatin (CRESTOR) 20 MG tablet Take 1 tablet (20 mg total) by mouth daily. 90 tablet 3   No current facility-administered medications for this visit.      OBJECTIVE: white woman in no acute distress  Vitals:   06/29/21 1120  BP: (!) 177/63  Pulse: 65  Resp: 18  Temp: 97.7 F (36.5 C)  SpO2: 100%  Wt Readings from Last 3 Encounters:  06/29/21 239 lb 6 oz (108.6 kg)  06/09/21 238 lb (108 kg)  05/04/21 238 lb (108 kg)   Body mass index is 41.09 kg/m.    ECOG FS:1 - Symptomatic but completely ambulatory  Sclerae unicteric, EOMs intact No cervical or supraclavicular adenopathy Lungs no rales or rhonchi Heart regular rate and rhythm Abd soft, obese, nontender, positive bowel sounds MSK no focal spinal tenderness, no upper extremity lymphedema Neuro: nonfocal, well oriented, appropriate affect Breasts: The right breast has undergone lumpectomy followed by radiation with no evidence of disease recurrence.  Surgical scar noted, left breast and both axillae are benign.   LAB RESULTS:  CMP     Component Value Date/Time   NA 138 04/28/2021 0845   NA 140 05/29/2017 1254   K 4.5 04/28/2021 0845   K 4.4 05/29/2017 1254   CL 106 04/28/2021 0845   CO2 23 04/28/2021 0845   CO2 25 05/29/2017 1254   GLUCOSE 274 (H) 04/28/2021 0845   GLUCOSE 154 (H) 05/29/2017 1254   BUN 14 04/28/2021 0845   BUN 11.5 05/29/2017 1254   CREATININE 0.91 04/28/2021 0845   CREATININE 0.77 06/30/2020 1030   CREATININE 0.8 05/29/2017 1254   CALCIUM 8.9 04/28/2021 0845   CALCIUM 9.3 05/29/2017 1254   PROT 7.1 04/28/2021 0845   PROT 7.0 05/29/2017 1254   ALBUMIN 3.9 04/28/2021 0845   ALBUMIN 4.1 05/29/2017 1254   AST 11 (L) 04/28/2021 0845   AST 17 06/30/2020 1030   AST 13 05/29/2017 1254   ALT 14 04/28/2021 0845   ALT 14 06/30/2020 1030   ALT 14 05/29/2017 1254   ALKPHOS 86 04/28/2021 0845   ALKPHOS 66 05/29/2017 1254   BILITOT 0.5 04/28/2021 0845   BILITOT 0.5 06/30/2020 1030   BILITOT 0.50 05/29/2017 1254   GFRNONAA >60 04/28/2021 0845   GFRNONAA >60 06/30/2020 1030   GFRAA >60 03/10/2020 1138    INo results found for: SPEP,  UPEP  Lab Results  Component Value Date   WBC 2.3 (L) 06/29/2021   NEUTROABS 1.1 (L) 06/29/2021   HGB 12.8 06/29/2021   HCT 36.7 06/29/2021   MCV 93.6 06/29/2021   PLT 135 (L) 06/29/2021      Chemistry      Component Value Date/Time   NA 138 04/28/2021 0845   NA 140 05/29/2017 1254   K 4.5 04/28/2021 0845   K 4.4 05/29/2017 1254   CL 106 04/28/2021 0845   CO2 23 04/28/2021 0845   CO2 25 05/29/2017 1254   BUN 14 04/28/2021 0845   BUN 11.5 05/29/2017 1254   CREATININE 0.91 04/28/2021 0845   CREATININE 0.77 06/30/2020 1030   CREATININE 0.8 05/29/2017 1254      Component Value Date/Time   CALCIUM 8.9 04/28/2021 0845   CALCIUM 9.3 05/29/2017 1254   ALKPHOS 86 04/28/2021 0845   ALKPHOS 66 05/29/2017 1254   AST 11 (L) 04/28/2021 0845   AST 17 06/30/2020 1030   AST 13 05/29/2017 1254   ALT 14 04/28/2021 0845   ALT 14 06/30/2020 1030   ALT 14 05/29/2017 1254   BILITOT 0.5 04/28/2021 0845   BILITOT 0.5 06/30/2020 1030   BILITOT 0.50 05/29/2017 1254       No results found for: LABCA2  No components found for: LABCA125  No results for input(s): INR in the last 168 hours.  Urinalysis    Component Value Date/Time   COLORURINE YELLOW 08/18/2017 1322  APPEARANCEUR HAZY (A) 08/18/2017 1322   LABSPEC 1.010 08/18/2017 1322   PHURINE 7.0 08/18/2017 1322   GLUCOSEU NEGATIVE 08/18/2017 1322   GLUCOSEU NEGATIVE 10/30/2014 1513   HGBUR LARGE (A) 08/18/2017 1322   BILIRUBINUR NEGATIVE 08/18/2017 1322   KETONESUR NEGATIVE 08/18/2017 1322   PROTEINUR 30 (A) 08/18/2017 1322   UROBILINOGEN 0.2 10/30/2014 1513   NITRITE NEGATIVE 08/18/2017 1322   LEUKOCYTESUR LARGE (A) 08/18/2017 1322    STUDIES: No results found.   ELIGIBLE FOR AVAILABLE RESEARCH PROTOCOL: no  ASSESSMENT: 76 y.o. [place] woman with a remote history of early stage endometrial cancer, subsequently status post right breast upper outer quadrant biopsy 01/22/2016 for a clinically multifocal T2 N0,  stage 2A invasive ductal carcinoma, grade 1, estrogen and progesterone receptor positive, HER-2 negative, with an MIB-1 between 10 and 15%.  (1) right axillary lymph node biopsy 03/02/2016 positive  (2) genetics testing 01/13/2016 through the Custom gene panel offered by [facility] found no deleterious mutations in  ATM, BARD1, BRCA1, BRCA2, BRIP1, CDH1, CHEK2, EPCAM, FANCC, MLH1, MSH2, MSH6, MUTYH, NBN, PALB2, PMS2, POLD1, PTEN, RAD51C, RAD51D, TP53, and XRCC2  METASTATIC DISEASE: OCT 2017 (3) CT scans of the chest abdomen and pelvis obtained 03/10/2016 are consistent with bilateral lung metastases and mediastinal and hilar nodal involvement, but no liver or bone spread  (a) bronchoscopic lymph node biopsy 2 (station 7, 13R) 03/28/2016 confirms metastatic adenocarcinoma, estrogen receptor positive, HER-2 not amplified  (b) baseline CA-27-29 on 04/18/2016 was 137.5.  (4) letrozole started 03/15/2016, palbociclib added 03/29/2016 at 125 mg/day, 21/7  (a) dose decreased to 100 mg per day, 21/7, beginning with February cycle  (b) palbociclib held 01/31/2017, with increasing symptoms  (c) palbociclib resumed October 2018 at 75 mg daily  (d) palbociclib dose reduced to 75 mg every other day February through April 2019  (e) palbociclib dose resumed at 75 mg daily as of 10/17/2017  (f) palbociclib dose decreased to 75 mg every other day beginning 07/31/2019  (g) palbociclib resumed at 75 mg daily, 21 days on 7 off, as of 08/25/2019  (5) status post double right lumpectomies and right axillary lymph node sampling 01/13/2017 for 2 separate invasive ductal carcinoma lesions, pT1a and pT1b, N1a, with negative margins, both lesions being estrogen and progesterone receptor positive and HER-2 negative  (6) adjuvant radiation completed 07/05/2017 1. 50.4 Gy in 28 fractions to the right breast and supraclavicular region using whole-breast tangent fields. 2. Boost to the seroma delivered an additional 10 Gy in 5  fractions. The total dose was 60.4 Gy.  (7) restaging studies:  (a) CT scan of the chest and bone scan 06/23/2017 showed stable scattered very small lung nodules, no bone lesions  (b) CT of the chest 10/17/2017 showed no new or progressive metastatic disease in the chest. The small left lower lobe pulmonary nodule is stable  (c) PET scan on 03/02/2018: shows no findings for residual or recurrent right breast cancer  (d) chest CT scan stable, questionable right renal cyst noted  (e) chest CT 09/18/2020 shows no measurable disease  (f) to the CT scan 04/29/2021 shows no evidence of active disease; a 2 cm thyroid nodule was noted   (8) right upper pole renal lesion noted to be enlarging on CT scan 06/22/2017  (a) no uptake on PET scan obtained 03/02/2018   PLAN: Mio is now 5 years out from definitive diagnosis of metastatic breast cancer with no evidence of disease activity.  She continues on Ibrance 75 mg once  daily 3 weeks on 1 week off and letrozole daily.  She has been doing imaging every 6 months and following up with Dr. Jana Hakim every 3 months.  No concerning review of systems or physical examination findings today.  We have reviewed the need for imaging in April 2023 before her next follow-up.  Labs from today show ongoing leukopenia and neutropenia, okay to continue Ibrance at the current dose. Ultrasound of the thyroid ordered for November 2023.   *Total Encounter Time as defined by the Centers for Medicare and Medicaid Services includes, in addition to the face-to-face time of a patient visit (documented in the note above) non-face-to-face time: obtaining and reviewing outside history, ordering and reviewing medications, tests or procedures, care coordination (communications with other health care professionals or caregivers) and documentation in the medical record.

## 2021-06-30 LAB — CANCER ANTIGEN 27.29: CA 27.29: 37.1 U/mL (ref 0.0–38.6)

## 2021-07-21 ENCOUNTER — Ambulatory Visit: Payer: PPO | Admitting: Dermatology

## 2021-07-21 ENCOUNTER — Other Ambulatory Visit: Payer: Self-pay | Admitting: *Deleted

## 2021-07-21 MED ORDER — PALBOCICLIB 75 MG PO TABS: 75.0000 mg | ORAL_TABLET | Freq: Every day | ORAL | 3 refills | Status: DC

## 2021-07-26 ENCOUNTER — Other Ambulatory Visit (HOSPITAL_COMMUNITY): Payer: Self-pay

## 2021-07-28 ENCOUNTER — Encounter: Payer: Self-pay | Admitting: Internal Medicine

## 2021-07-28 ENCOUNTER — Other Ambulatory Visit: Payer: Self-pay

## 2021-07-28 ENCOUNTER — Telehealth: Payer: Self-pay | Admitting: *Deleted

## 2021-07-28 ENCOUNTER — Ambulatory Visit (INDEPENDENT_AMBULATORY_CARE_PROVIDER_SITE_OTHER): Payer: PPO | Admitting: Internal Medicine

## 2021-07-28 VITALS — BP 140/70 | HR 93 | Temp 99.0°F | Ht 64.0 in | Wt 236.0 lb

## 2021-07-28 DIAGNOSIS — E538 Deficiency of other specified B group vitamins: Secondary | ICD-10-CM

## 2021-07-28 DIAGNOSIS — J029 Acute pharyngitis, unspecified: Secondary | ICD-10-CM | POA: Diagnosis not present

## 2021-07-28 DIAGNOSIS — E1165 Type 2 diabetes mellitus with hyperglycemia: Secondary | ICD-10-CM

## 2021-07-28 DIAGNOSIS — I10 Essential (primary) hypertension: Secondary | ICD-10-CM | POA: Diagnosis not present

## 2021-07-28 DIAGNOSIS — E559 Vitamin D deficiency, unspecified: Secondary | ICD-10-CM

## 2021-07-28 DIAGNOSIS — E782 Mixed hyperlipidemia: Secondary | ICD-10-CM

## 2021-07-28 DIAGNOSIS — Z0000 Encounter for general adult medical examination without abnormal findings: Secondary | ICD-10-CM

## 2021-07-28 LAB — POCT GLYCOSYLATED HEMOGLOBIN (HGB A1C): Hemoglobin A1C: 9.4 % — AB (ref 4.0–5.6)

## 2021-07-28 MED ORDER — AZITHROMYCIN 250 MG PO TABS: ORAL_TABLET | ORAL | 1 refills | Status: AC

## 2021-07-28 MED ORDER — LOVASTATIN 20 MG PO TABS: ORAL_TABLET | ORAL | 3 refills | Status: DC

## 2021-07-28 MED ORDER — PIOGLITAZONE HCL 15 MG PO TABS: 15.0000 mg | ORAL_TABLET | Freq: Every day | ORAL | 3 refills | Status: DC

## 2021-07-28 NOTE — Progress Notes (Signed)
Patient ID: [name], female   DOB: Sep 13, 1945, 76 y.o.   MRN: [id number]         Chief Complaint:: wellness exam and DM, pharyngitis, htn       HPI:  [name] is a 76 y.o. female here for wellness exam; declines covid booster, shingrix, dxa, colonoscpoy, tdap, hep c screen o/w up to date                        Also lost about 20 lbs mostly intentioinally.  Has incidentally ST with feeling ill x 2 days.  Asks for change crestor back to lovastatin 20 qod as this was tolerable.  Pt denies chest pain, increased sob or doe, wheezing, orthopnea, PND, increased LE swelling, palpitations, dizziness or syncope.    Wt Readings from Last 3 Encounters:  07/28/21 236 lb (107 kg)  06/29/21 239 lb 6 oz (108.6 kg)  06/09/21 238 lb (108 kg)   BP Readings from Last 3 Encounters:  07/28/21 140/70  06/29/21 (!) 177/63  06/09/21 126/68   Immunization History  Administered Date(s) Administered   Influenza Split 04/05/2012, 03/16/2020   Influenza Whole 03/27/2006, 04/14/2008   Influenza, High Dose Seasonal PF 02/25/2019, 03/17/2021   Influenza,inj,Quad PF,6+ Mos 03/27/2014   Influenza-Unspecified 03/13/2013, 03/11/2015, 03/07/2016, 03/07/2017, 03/08/2018, 03/27/2018, 03/01/2019   PFIZER(Purple Top)SARS-COV-2 Vaccination 07/03/2019, 07/24/2019, 02/27/2020, 09/10/2020, 04/19/2021   There are no preventive care reminders to display for this patient.     Past Medical History:  Diagnosis Date   Breast cancer ([place])    Cancer ([place]) 02/2016   right breast   DIABETES MELLITUS, TYPE II 01/04/2007   only takes actoplus daily   Dizziness and giddiness 02/29/2008   DVT, HX OF    at age 71 in right buttocks   Dyspnea    due to lung cancer   Family history of breast cancer    GERD 01/04/2007   pt reports resolved    GLAUCOMA 07/30/2008   both eyes   History of blood transfusion    no abnormal  reaction   History of uterine cancer 2000   hysterectomy done   HYPERLIPIDEMIA 01/04/2007   taking  Pravastatin daily   HYPERTENSION 01/04/2007   takes Lisinopril daily   Joint pain    Joint swelling    Leg cramps    LEG PAIN, LEFT 07/06/2007   NUMBNESS 07/30/2008   in fingers;pt states from Diamox   OSTEOARTHRITIS, HIP 09/25/2009   OTITIS MEDIA, ACUTE, BILATERAL 02/29/2008   Overweight(278.02) 01/04/2007   Peripheral vascular disease ([place])    Personal history of radiation therapy 2018   Pneumonia    PONV (postoperative nausea and vomiting)    SLEEP APNEA, OBSTRUCTIVE    doesn't use a cpap;study done about 4yrs ago   [place], HX OF 01/04/2007   Vision loss    left eye   Past Surgical History:  Procedure Laterality Date   ABDOMINAL HYSTERECTOMY  2000   BREAST LUMPECTOMY Right 01/13/2017   x2   BREAST LUMPECTOMY WITH RADIOACTIVE SEED AND SENTINEL LYMPH NODE BIOPSY Right 01/13/2017   Procedure: RIGHT BREAST RADIOACTIVE SEED X'S 2 GUIDED LUMPECTOMY WITH RADIOACTIVE SEED TARGETED AXILLARYLYMPH NODE EXCISION AND RIGHT AXILLARY SENTINEL LYMPH NODE BIOPSY;  Surgeon: Alphonsa Overall, MD;  Location: [place] [facility];  Service: General;  Laterality: Right;  2 SEEDS IN RIGHT BREAST 1 SEED IN RIGHT AXILLARY NODE   CHOLECYSTECTOMY     ENDOBRONCHIAL ULTRASOUND Bilateral 03/28/2016  Procedure: ENDOBRONCHIAL ULTRASOUND;  Surgeon: Collene Gobble, MD;  Location: [facility];  Service: Cardiopulmonary;  Laterality: Bilateral;   EYE SURGERY  13   shunt left and lazer eye surgery on right cataract and retenia tear with repair   growth removal  2004   from thumb   KNEE ARTHROSCOPY Right    mulitple eye surgeries     both eyes, cataracts with ioc done both eyes   OOPHORECTOMY     right lumpectomy with axillary node dissection Right 01/2017   TOTAL HIP ARTHROPLASTY  06/24/2011   Procedure: TOTAL HIP ARTHROPLASTY;  Surgeon: Kerin Salen;  Location: [facility];  Service: Orthopedics;  Laterality: Right;   TOTAL HIP ARTHROPLASTY Left 11/12/2012   Dr Mayer Camel   TOTAL HIP ARTHROPLASTY Left 11/12/2012    Procedure: TOTAL HIP ARTHROPLASTY;  Surgeon: Kerin Salen, MD;  Location: [facility] [place];  Service: Orthopedics;  Laterality: Left;  DEPUY PINNACLE    reports that she has never smoked. She has never used smokeless tobacco. She reports that she does not drink alcohol and does not use drugs. family history includes Breast cancer (age of onset: 58) in her sister; Cancer in her mother; Dementia in her mother; Glaucoma in her maternal grandfather; Heart attack in her maternal aunt; Lung cancer in her maternal grandmother; Stroke in her sister. Allergies  Allergen Reactions   Codeine Hives    Hycodan syrup   Fluorescein Nausea And Vomiting    ? IV dye for retina specialist   Lipitor [Atorvastatin Calcium]     Leg cramp   Oxycodone Nausea And Vomiting    Patient vomited for 3 days after taking   Sitagliptin Phosphate Nausea And Vomiting   Sulfa Drugs Cross Reactors Nausea And Vomiting   Current Outpatient Medications on File Prior to Visit  Medication Sig Dispense Refill   acetaminophen (TYLENOL) 500 MG tablet Take 1,000 mg by mouth every 4 (four) hours as needed for moderate pain or fever.     aspirin EC 81 MG tablet Take 81 mg by mouth daily at 6 PM. 1700     Biotin 1 MG CAPS Take by mouth.     brimonidine (ALPHAGAN) 0.2 % ophthalmic solution INSTILL 1 DROP INTO EACH EYE THREE TIMES DAILY     brimonidine-timolol (COMBIGAN) 0.2-0.5 % ophthalmic solution      cholecalciferol (VITAMIN D) 1000 units tablet Take 1,000 Units by mouth daily.     dorzolamide-timolol (COSOPT) 22.3-6.8 MG/ML ophthalmic solution Place 1 drop into both eyes 2 (two) times daily.     ketorolac (ACULAR) 0.5 % ophthalmic solution SMARTSIG:In Eye(s)     letrozole (FEMARA) 2.5 MG tablet TAKE 1 TABLET BY MOUTH AT BEDTIME. 90 tablet 4   lisinopril (ZESTRIL) 5 MG tablet Take 5 mg by mouth daily.     meloxicam (MOBIC) 7.5 MG tablet Take 1 tablet (7.5 mg total) by mouth daily. 15 tablet 0   Netarsudil-Latanoprost (ROCKLATAN) 0.02-0.005 %  SOLN Apply to eye.     palbociclib (IBRANCE) 75 MG tablet Take 1 tablet (75 mg total) by mouth daily. Take for 21 days on, 7 days off, repeat every 28 days. 21 tablet 3   prednisoLONE acetate (PRED FORTE) 1 % ophthalmic suspension SMARTSIG:1 In Eye(s) 6 Times Daily     dapagliflozin propanediol (FARXIGA) 10 MG TABS tablet Take 1 tablet (10 mg total) by mouth daily. (Patient not taking: Reported on 07/28/2021) 90 tablet 3   methocarbamol (ROBAXIN) 500 MG tablet Take 1 tablet (500  mg total) by mouth 2 (two) times daily. (Patient not taking: Reported on 07/28/2021) 30 tablet 0   No current facility-administered medications on file prior to visit.        ROS:  All others reviewed and negative.  Objective        PE:  BP 140/70 (BP Location: Left Arm, Patient Position: Sitting, Cuff Size: Large)    Pulse 93    Temp 99 F (37.2 C) (Oral)    Ht 5\' 4"  (1.626 m)    Wt 236 lb (107 kg)    SpO2 95%    BMI 40.51 kg/m                 Constitutional: Pt appears in NAD               HENT: Head: NCAT.                Right Ear: External ear normal.                 Left Ear: External ear normal. Bilat tm's with mild erythema.  Max sinus areas mild tender.  Pharynx with mild erythema, no exudate               Eyes: . Pupils are equal, round, and reactive to light. Conjunctivae and EOM are normal               Nose: without d/c or deformity               Neck: Neck supple. Gross normal ROM               Cardiovascular: Normal rate and regular rhythm.                 Pulmonary/Chest: Effort normal and breath sounds without rales or wheezing.                Abd:  Soft, NT, ND, + BS, no organomegaly               Neurological: Pt is alert. At baseline orientation, motor grossly intact               Skin: Skin is warm. No rashes, no other new lesions, LE edema - none               Psychiatric: Pt behavior is normal without agitation   Micro: none  Cardiac tracings I have personally interpreted today:   none  Pertinent Radiological findings (summarize): none   Lab Results  Component Value Date   WBC 2.3 (L) 06/29/2021   HGB 12.8 06/29/2021   HCT 36.7 06/29/2021   PLT 135 (L) 06/29/2021   GLUCOSE 256 (H) 06/29/2021   CHOL 195 04/29/2015   TRIG 129.0 04/29/2015   HDL 52.40 04/29/2015   LDLCALC 117 (H) 04/29/2015   ALT 13 06/29/2021   AST 12 (L) 06/29/2021   NA 137 06/29/2021   K 4.3 06/29/2021   CL 104 06/29/2021   CREATININE 0.69 06/29/2021   BUN 19 06/29/2021   CO2 25 06/29/2021   TSH 1.172 09/24/2018   INR 1.06 11/08/2012   HGBA1C 9.4 (A) 07/28/2021   MICROALBUR <0.7 10/30/2014   Hemoglobin A1C 4.0 - 5.6 % 9.4 Abnormal   8.9 Abnormal   8.7 Abnormal   6.7 Abnormal    Assessment/Plan:  [name] is a 76 y.o. White or Caucasian [1] female with  has a past medical history  of Breast cancer ([place]), Cancer ([place]) (02/2016), DIABETES MELLITUS, TYPE II (01/04/2007), Dizziness and giddiness (02/29/2008), DVT, HX OF, Dyspnea, Family history of breast cancer, GERD (01/04/2007), GLAUCOMA (07/30/2008), History of blood transfusion, History of uterine cancer (2000), HYPERLIPIDEMIA (01/04/2007), HYPERTENSION (01/04/2007), Joint pain, Joint swelling, Leg cramps, LEG PAIN, LEFT (07/06/2007), NUMBNESS (07/30/2008), OSTEOARTHRITIS, HIP (09/25/2009), OTITIS MEDIA, ACUTE, BILATERAL (02/29/2008), Overweight(278.02) (01/04/2007), Peripheral vascular disease ([place]), Personal history of radiation therapy (2018), Pneumonia, PONV (postoperative nausea and vomiting), SLEEP APNEA, OBSTRUCTIVE, TRANSIENT ISCHEMIC ATTACK, HX OF (01/04/2007), and Vision loss.  Preventative health care Age and sex appropriate education and counseling updated with regular exercise and diet Referrals for preventative services - declines colonosocpy, dxa, hep c screen Immunizations addressed - declines covid booster, shingrix, tdap Smoking counseling  - none needed Evidence for depression or other mood disorder - chronic anxiety  stable Most recent labs reviewed. I have personally reviewed and have noted: 1) the patient's medical and social history 2) The patient's current medications and supplements 3) The patient's height, weight, and BMI have been recorded in the chart   Diabetes Lab Results  Component Value Date   HGBA1C 9.4 (A) 07/28/2021   uncontrolled, pt not taking farxiga due to cost, to start actos 15 qd   Essential hypertension BP Readings from Last 3 Encounters:  07/28/21 140/70  06/29/21 (!) 177/63  06/09/21 126/68   Stable, pt to continue medical treatment lisinopril   Hyperlipidemia Pt reqeusts  Change of crestor to lovastatin 20 qod as this was tolerable  Pharyngitis Mild to mod, for antibx course,  to f/u any worsening symptoms or concerns  Followup: Return in about 6 months (around 01/25/2022).  Cathlean Cower, MD 08/01/2021 4:44 PM [facility]

## 2021-07-28 NOTE — Patient Instructions (Addendum)
Your A1c was done today  Remember, the cancer doctors normally dont do the A1c, cholesterol or thyroid testing as part of routine labs there, so Please go to the LAB at the blood drawing area for the tests to be done at the [facility] in 6 mo (a few days prior to your visit)  Please take all new medication as prescribed - the antibiotic  Ok to change the farxiga back to the actos 15 mg  Ok to change the crestor back to the lovastatin 20 mg every other day as you prefer  Please continue all other medications as before, and refills have been done if requested.  Please have the pharmacy call with any other refills you may need.  Please continue your efforts at being more active, low cholesterol diet, and weight control.  You are otherwise up to date with prevention measures today.  Please keep your appointments with your specialists as you may have planned  Please make an Appointment to return in 6 months, or sooner if needed, also with Labs to be done beforehand, as above

## 2021-07-29 DIAGNOSIS — L820 Inflamed seborrheic keratosis: Secondary | ICD-10-CM | POA: Diagnosis not present

## 2021-07-29 DIAGNOSIS — L821 Other seborrheic keratosis: Secondary | ICD-10-CM | POA: Diagnosis not present

## 2021-07-29 DIAGNOSIS — D1801 Hemangioma of skin and subcutaneous tissue: Secondary | ICD-10-CM | POA: Diagnosis not present

## 2021-07-29 DIAGNOSIS — D225 Melanocytic nevi of trunk: Secondary | ICD-10-CM | POA: Diagnosis not present

## 2021-08-01 ENCOUNTER — Encounter: Payer: Self-pay | Admitting: Internal Medicine

## 2021-08-01 DIAGNOSIS — J029 Acute pharyngitis, unspecified: Secondary | ICD-10-CM | POA: Insufficient documentation

## 2021-08-01 NOTE — Assessment & Plan Note (Signed)
BP Readings from Last 3 Encounters:  07/28/21 140/70  06/29/21 (!) 177/63  06/09/21 126/68   Stable, pt to continue medical treatment lisinopril

## 2021-08-01 NOTE — Assessment & Plan Note (Signed)
Mild to mod, for antibx course,  to f/u any worsening symptoms or concerns 

## 2021-08-01 NOTE — Assessment & Plan Note (Signed)
Pt reqeusts  Change of crestor to lovastatin 20 qod as this was tolerable

## 2021-08-01 NOTE — Assessment & Plan Note (Signed)
Age and sex appropriate education and counseling updated with regular exercise and diet Referrals for preventative services - declines colonosocpy, dxa, hep c screen Immunizations addressed - declines covid booster, shingrix, tdap Smoking counseling  - none needed Evidence for depression or other mood disorder - chronic anxiety stable Most recent labs reviewed. I have personally reviewed and have noted: 1) the patient's medical and social history 2) The patient's current medications and supplements 3) The patient's height, weight, and BMI have been recorded in the chart

## 2021-08-01 NOTE — Assessment & Plan Note (Signed)
Lab Results  Component Value Date   HGBA1C 9.4 (A) 07/28/2021   uncontrolled, pt not taking farxiga due to cost, to start actos 15 qd

## 2021-08-02 NOTE — Telephone Encounter (Signed)
No entry 

## 2021-08-06 ENCOUNTER — Telehealth: Payer: Self-pay | Admitting: Internal Medicine

## 2021-08-06 NOTE — Telephone Encounter (Signed)
Pt requesting a c/b regarding  rosuvastatin and dapagliflozin propanediol (FARXIGA) 10 MG TABS tablet side effects, pt states she will not take the medications

## 2021-08-10 NOTE — Telephone Encounter (Signed)
Erroneous entry

## 2021-08-30 ENCOUNTER — Other Ambulatory Visit: Payer: Self-pay | Admitting: *Deleted

## 2021-09-01 DIAGNOSIS — H20013 Primary iridocyclitis, bilateral: Secondary | ICD-10-CM | POA: Diagnosis not present

## 2021-09-15 DIAGNOSIS — H20013 Primary iridocyclitis, bilateral: Secondary | ICD-10-CM | POA: Diagnosis not present

## 2021-09-20 ENCOUNTER — Ambulatory Visit (HOSPITAL_COMMUNITY)
Admission: RE | Admit: 2021-09-20 | Discharge: 2021-09-20 | Disposition: A | Discharge status: Home / Self Care | Payer: PPO | Source: Ambulatory Visit | Attending: Hematology and Oncology | Admitting: Hematology and Oncology

## 2021-09-20 DIAGNOSIS — R590 Localized enlarged lymph nodes: Secondary | ICD-10-CM | POA: Diagnosis not present

## 2021-09-20 DIAGNOSIS — K439 Ventral hernia without obstruction or gangrene: Secondary | ICD-10-CM | POA: Diagnosis not present

## 2021-09-20 DIAGNOSIS — J984 Other disorders of lung: Secondary | ICD-10-CM | POA: Diagnosis not present

## 2021-09-20 DIAGNOSIS — C7800 Secondary malignant neoplasm of unspecified lung: Secondary | ICD-10-CM | POA: Diagnosis not present

## 2021-09-20 DIAGNOSIS — K5730 Diverticulosis of large intestine without perforation or abscess without bleeding: Secondary | ICD-10-CM | POA: Diagnosis not present

## 2021-09-20 DIAGNOSIS — N281 Cyst of kidney, acquired: Secondary | ICD-10-CM | POA: Diagnosis not present

## 2021-09-20 DIAGNOSIS — C50919 Malignant neoplasm of unspecified site of unspecified female breast: Secondary | ICD-10-CM | POA: Diagnosis not present

## 2021-09-20 DIAGNOSIS — I2510 Atherosclerotic heart disease of native coronary artery without angina pectoris: Secondary | ICD-10-CM | POA: Diagnosis not present

## 2021-09-20 DIAGNOSIS — K429 Umbilical hernia without obstruction or gangrene: Secondary | ICD-10-CM | POA: Diagnosis not present

## 2021-09-20 DIAGNOSIS — M47816 Spondylosis without myelopathy or radiculopathy, lumbar region: Secondary | ICD-10-CM | POA: Diagnosis not present

## 2021-09-20 LAB — POCT I-STAT CREATININE: Creatinine, Ser: 0.6 mg/dL (ref 0.44–1.00)

## 2021-09-20 MED ORDER — SODIUM CHLORIDE (PF) 0.9 % IJ SOLN
INTRAMUSCULAR | Status: AC
  Filled 2021-09-20: qty 50

## 2021-09-20 MED ORDER — IOHEXOL 300 MG/ML  SOLN
100.0000 mL | Freq: Once | INTRAMUSCULAR | Status: AC | PRN
  Administered 2021-09-20: 100 mL via INTRAVENOUS

## 2021-09-27 ENCOUNTER — Other Ambulatory Visit: Payer: Self-pay

## 2021-09-27 DIAGNOSIS — C7800 Secondary malignant neoplasm of unspecified lung: Secondary | ICD-10-CM

## 2021-09-28 ENCOUNTER — Inpatient Hospital Stay: Payer: PPO | Attending: Hematology and Oncology

## 2021-09-28 ENCOUNTER — Encounter: Payer: Self-pay | Admitting: Hematology and Oncology

## 2021-09-28 ENCOUNTER — Other Ambulatory Visit: Payer: Self-pay

## 2021-09-28 ENCOUNTER — Inpatient Hospital Stay: Payer: PPO | Admitting: Hematology and Oncology

## 2021-09-28 VITALS — BP 158/69 | HR 94 | Temp 97.7°F | Resp 18 | Ht 64.0 in | Wt 234.3 lb

## 2021-09-28 DIAGNOSIS — Z85118 Personal history of other malignant neoplasm of bronchus and lung: Secondary | ICD-10-CM | POA: Insufficient documentation

## 2021-09-28 DIAGNOSIS — C7800 Secondary malignant neoplasm of unspecified lung: Secondary | ICD-10-CM

## 2021-09-28 DIAGNOSIS — D3500 Benign neoplasm of unspecified adrenal gland: Secondary | ICD-10-CM | POA: Diagnosis not present

## 2021-09-28 DIAGNOSIS — E119 Type 2 diabetes mellitus without complications: Secondary | ICD-10-CM | POA: Insufficient documentation

## 2021-09-28 DIAGNOSIS — Z8589 Personal history of malignant neoplasm of other organs and systems: Secondary | ICD-10-CM | POA: Diagnosis not present

## 2021-09-28 DIAGNOSIS — I10 Essential (primary) hypertension: Secondary | ICD-10-CM | POA: Insufficient documentation

## 2021-09-28 DIAGNOSIS — Z853 Personal history of malignant neoplasm of breast: Secondary | ICD-10-CM | POA: Diagnosis not present

## 2021-09-28 LAB — CBC WITH DIFFERENTIAL (CANCER CENTER ONLY)
Abs Immature Granulocytes: 0.01 10*3/uL (ref 0.00–0.07)
Basophils Absolute: 0 10*3/uL (ref 0.0–0.1)
Basophils Relative: 1 %
Eosinophils Absolute: 0 10*3/uL (ref 0.0–0.5)
Eosinophils Relative: 2 %
HCT: 37.2 % (ref 36.0–46.0)
Hemoglobin: 12.9 g/dL (ref 12.0–15.0)
Immature Granulocytes: 0 %
Lymphocytes Relative: 27 %
Lymphs Abs: 0.7 10*3/uL (ref 0.7–4.0)
MCH: 32.5 pg (ref 26.0–34.0)
MCHC: 34.7 g/dL (ref 30.0–36.0)
MCV: 93.7 fL (ref 80.0–100.0)
Monocytes Absolute: 0.2 10*3/uL (ref 0.1–1.0)
Monocytes Relative: 8 %
Neutro Abs: 1.7 10*3/uL (ref 1.7–7.7)
Neutrophils Relative %: 62 %
Platelet Count: 186 10*3/uL (ref 150–400)
RBC: 3.97 MIL/uL (ref 3.87–5.11)
RDW: 14.7 % (ref 11.5–15.5)
WBC Count: 2.7 10*3/uL — ABNORMAL LOW (ref 4.0–10.5)
nRBC: 0 % (ref 0.0–0.2)

## 2021-09-28 LAB — CMP (CANCER CENTER ONLY)
ALT: 13 U/L (ref 0–44)
AST: 10 U/L — ABNORMAL LOW (ref 15–41)
Albumin: 4 g/dL (ref 3.5–5.0)
Alkaline Phosphatase: 88 U/L (ref 38–126)
Anion gap: 6 (ref 5–15)
BUN: 19 mg/dL (ref 8–23)
CO2: 29 mmol/L (ref 22–32)
Calcium: 9.2 mg/dL (ref 8.9–10.3)
Chloride: 103 mmol/L (ref 98–111)
Creatinine: 0.81 mg/dL (ref 0.44–1.00)
GFR, Estimated: >60 mL/min (ref >60)
Glucose, Bld: 305 mg/dL — ABNORMAL HIGH (ref 70–99)
Potassium: 4.6 mmol/L (ref 3.5–5.1)
Sodium: 138 mmol/L (ref 135–145)
Total Bilirubin: 0.5 mg/dL (ref 0.3–1.2)
Total Protein: 7 g/dL (ref 6.5–8.1)

## 2021-09-28 NOTE — Progress Notes (Signed)
?[facility]  ?[phone] [phone]  ? ? ? ?ID: [name] DOB: [birth year]  MR#: [id number]  GYK#:[id number] ? ?Patient Care Team: ?Biagio Borg, MD as PCP - General (Internal Medicine) ?Alphonsa Overall, MD as Consulting Physician (General Surgery) ?Magrinat, Virgie Dad, MD (Inactive) as Consulting Physician (Oncology) ?Kyung Rudd, MD as Consulting Physician (Radiation Oncology) ?Bobbye Charleston, MD as Consulting Physician (Obstetrics and Gynecology) ?Nada Libman, MD as Referring Physician (Specialist) ?Collene Gobble, MD as Consulting Physician (Pulmonary Disease) ?Alexis Frock, MD as Consulting Physician (Urology) ?Moya, Carlisle Beers, MD as Referring Physician (Ophthalmology) ?Raina Mina, RPH-CPP (Pharmacist) ?OTHER MD: ? ? ?CHIEF COMPLAINT: Estrogen receptor positive breast cancer ? ?CURRENT TREATMENT: Letrozole, palbociclib ? ? ?INTERVAL HISTORY: ? ?[name] returns today for follow-up of her estrogen receptor positive stage IV breast cancer.  She is accompanied by her husband [name].  She likes to read up on her entire scan report and had several questions throughout the scan report.   ?She also was a little worried about her blood glucose levels and possible lower urinary issues since she had the imaging.  She has also noticed some breast tenderness after the scan bilaterally and was wondering if this could be related to her diet.  She had several questions regarding the adrenal nodule, coronary artery calcifications, biliary ductal dilatation.  She overall has been tolerating Ibrance and letrozole very well otherwise.  No adverse effects. ?Rest of the pertinent 10 point ROS reviewed and negative. ? ?We are continuing to follow her tumor marker: ?Lab Results  ?Component Value Date  ? BL3903 37.1 06/29/2021  ? [id number] 43.1 (H) 04/28/2021  ? [id number] 37.2 01/26/2021  ? [id number] 32.3 09/18/2020  ? [id number] 32.1 06/30/2020  ? ? ?REVIEW OF SYSTEMS: ?  A detailed review of  systems today was otherwise stable. ? ? COVID 19 VACCINATION STATUS: Status post Pfizer x2 followed by booster August 2021; had COVID November 2022 ? ? ?BREAST CANCER HISTORY: ?From the original intake note: ? ?[name] had screening mammography showing some suspicious calcifications in the right breast leading to right diagnostic mammography with ultrasonography 01/22/2016 at [facility]. The breast density was category C. In the upper right breast there was a 2.3 cm mass with additional masses measuring 0.9 and 0.7 cm. There was also a possible additional 0.8 mass in the lower inner quadrant. Ultrasound confirmed an irregular hypoechoic mass in the right breast upper outer quadrant measuring 2.0 cm. There were other masses measuring 0.7 and 0.8 cm by ultrasonography. The right axilla was sonographically benign. ? ?Biopsy of a 12:00 and 4:00 mass in the right breast 01/22/2016 showed ([id number]) both specimens showing invasive ductal carcinoma, grade 1 or 2, both 95% estrogen receptor positive, both 95% progesterone receptor positive, both with strong staining intensity, with MIB-1 ranging from 10-15%, and both HER-2 negative, the signals ratio being 1.23-1.42, and the number per cell 1.85-2.59. ? ?Her subsequent history is as detailed below ? ? ?PAST MEDICAL HISTORY: ?Past Medical History:  ?Diagnosis Date  ? Breast cancer ([place])   ? Cancer ([place]) 02/2016  ? right breast  ? DIABETES MELLITUS, TYPE II 01/04/2007  ? only takes actoplus daily  ? Dizziness and giddiness 02/29/2008  ? DVT, HX OF   ? at age 46 in right buttocks  ? Dyspnea   ? due to lung cancer  ? Family history of breast cancer   ? GERD 01/04/2007  ? pt reports resolved   ? GLAUCOMA 07/30/2008  ?  both eyes  ? History of blood transfusion   ? no abnormal  reaction  ? History of uterine cancer 2000  ? hysterectomy done  ? HYPERLIPIDEMIA 01/04/2007  ? taking Pravastatin daily  ? HYPERTENSION 01/04/2007  ? takes Lisinopril daily  ? Joint pain   ? Joint swelling   ? Leg  cramps   ? LEG PAIN, LEFT 07/06/2007  ? NUMBNESS 07/30/2008  ? in fingers;pt states from Diamox  ? OSTEOARTHRITIS, HIP 09/25/2009  ? OTITIS MEDIA, ACUTE, BILATERAL 02/29/2008  ? Overweight(278.02) 01/04/2007  ? Peripheral vascular disease ([place])   ? Personal history of radiation therapy 2018  ? Pneumonia   ? PONV (postoperative nausea and vomiting)   ? SLEEP APNEA, OBSTRUCTIVE   ? doesn't use a cpap;study done about 68yrs ago  ? TRANSIENT ISCHEMIC ATTACK, HX OF 01/04/2007  ? Vision loss   ? left eye  ? ? ?PAST SURGICAL HISTORY: ?Past Surgical History:  ?Procedure Laterality Date  ? ABDOMINAL HYSTERECTOMY  2000  ? BREAST LUMPECTOMY Right 01/13/2017  ? x2  ? BREAST LUMPECTOMY WITH RADIOACTIVE SEED AND SENTINEL LYMPH NODE BIOPSY Right 01/13/2017  ? Procedure: RIGHT BREAST RADIOACTIVE SEED X'S 2 GUIDED LUMPECTOMY WITH RADIOACTIVE SEED TARGETED AXILLARYLYMPH NODE EXCISION AND RIGHT AXILLARY SENTINEL LYMPH NODE BIOPSY;  Surgeon: Alphonsa Overall, MD;  Location: [place];  Service: General;  Laterality: Right;  2 SEEDS IN RIGHT BREAST 1 SEED IN RIGHT AXILLARY NODE  ? CHOLECYSTECTOMY    ? ENDOBRONCHIAL ULTRASOUND Bilateral 03/28/2016  ? Procedure: ENDOBRONCHIAL ULTRASOUND;  Surgeon: Collene Gobble, MD;  Location: [facility];  Service: Cardiopulmonary;  Laterality: Bilateral;  ? EYE SURGERY  13  ? shunt left and lazer eye surgery on right cataract and retenia tear with repair  ? growth removal  2004  ? from thumb  ? KNEE ARTHROSCOPY Right   ? mulitple eye surgeries    ? both eyes, cataracts with ioc done both eyes  ? OOPHORECTOMY    ? right lumpectomy with axillary node dissection Right 01/2017  ? TOTAL HIP ARTHROPLASTY  06/24/2011  ? Procedure: TOTAL HIP ARTHROPLASTY;  Surgeon: Kerin Salen;  Location: [place];  Service: Orthopedics;  Laterality: Right;  ? TOTAL HIP ARTHROPLASTY Left 11/12/2012  ? Dr Mayer Camel  ? TOTAL HIP ARTHROPLASTY Left 11/12/2012  ? Procedure: TOTAL HIP ARTHROPLASTY;  Surgeon: Kerin Salen, MD;  Location: [place];   Service: Orthopedics;  Laterality: Left;  DEPUY PINNACLE  ? ? ?FAMILY HISTORY ?Family History  ?Problem Relation Age of Onset  ? Dementia Mother   ? Cancer Mother   ?     Breast and lung cancer  ? Stroke Sister   ? Breast cancer Sister 40  ? Heart attack Maternal Aunt   ? Lung cancer Maternal Grandmother   ?     non smoker  ? Glaucoma Maternal Grandfather   ? Anesthesia problems Neg Hx   ?The patient's father died at age 40, the patient's mother died at age 21. She had breast and lung cancers diagnosed shortly before her death. The patient had no brothers, 2 sisters. One sister was diagnosed with breast cancer at the age of 23. ? ? ?GYNECOLOGIC HISTORY:  ?No LMP recorded. Patient has had a hysterectomy. ?Menarche age 29, first live birth age 66, the patient is GX P1. She had a hysterectomy for endometrial cancer in the year 2000. She did not take hormone replacement. She did use oral contraceptives for more than 20 years remotely, with  no complications. ? ? ?SOCIAL HISTORY: (Updated July 2021). 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 She is home with her husband [name]. He is a retired Dealer.Their son [name] also lives in [place].  The patient has 3 grandchildren aged 50, 42 and 22 ?  ? ADVANCED DIRECTIVES: In place ? ? ?HEALTH MAINTENANCE: ?Social History  ? ?Tobacco Use  ? Smoking status: Never  ? Smokeless tobacco: Never  ?Vaping Use  ? Vaping Use: Never used  ?Substance Use Topics  ? Alcohol use: No  ? Drug use: No  ? ? ? Colonoscopy: Never ? PAP: Status post hysterectomy ? Bone density: Remote ?  ?Allergies  ?Allergen Reactions  ? Codeine Hives  ?  Hycodan syrup  ? Fluorescein Nausea And Vomiting  ?  ? IV dye for retina specialist  ? Lipitor [Atorvastatin Calcium]   ?  Leg cramp  ? Oxycodone Nausea And Vomiting  ?  Patient vomited for 3 days after taking  ? Sitagliptin Phosphate Nausea And Vomiting  ? Sulfa Drugs Cross Reactors Nausea  And Vomiting  ? ? ?Current Outpatient Medications  ?Medication Sig Dispense Refill  ? acetaminophen (TYLENOL) 500 MG tablet Take 1,000 mg by mouth every 4 (four) hours as needed for moderate pain or fever.

## 2021-09-29 ENCOUNTER — Telehealth: Payer: Self-pay | Admitting: Internal Medicine

## 2021-09-29 LAB — CANCER ANTIGEN 27.29: CA 27.29: 35.5 U/mL (ref 0.0–38.6)

## 2021-09-29 NOTE — Telephone Encounter (Signed)
Ok with me 

## 2021-09-29 NOTE — Telephone Encounter (Signed)
Pt requesting a toc from Dr. Jenny Reichmann to Dr. Sharlet Salina ? ?Please advise ?

## 2021-09-30 ENCOUNTER — Telehealth: Payer: Self-pay

## 2021-09-30 NOTE — Telephone Encounter (Signed)
Spoke with pt who called back to let us know she scheduled her mammo and to ask if we ca order an A1C for her when she comes in July to our office. Pt states she does have a PCP but does not like him. I explained to pt that she would need to have her PCP fax an order to [place] so the results would go to him/her to be managed if there were any results out of range. Pt states she will establish care with a different PCP and see if she can get an order.  ?

## 2021-10-04 NOTE — Telephone Encounter (Signed)
Fine to [facility], ok to work in early if needs care prior to next [facility] ?

## 2021-10-12 NOTE — Telephone Encounter (Signed)
LVM for pt to call office to schedule toc appt  ? ?Please see below ?

## 2021-10-15 ENCOUNTER — Ambulatory Visit (INDEPENDENT_AMBULATORY_CARE_PROVIDER_SITE_OTHER): Payer: PPO | Admitting: Internal Medicine

## 2021-10-15 ENCOUNTER — Encounter: Payer: Self-pay | Admitting: Internal Medicine

## 2021-10-15 DIAGNOSIS — E1169 Type 2 diabetes mellitus with other specified complication: Secondary | ICD-10-CM

## 2021-10-15 DIAGNOSIS — E785 Hyperlipidemia, unspecified: Secondary | ICD-10-CM | POA: Diagnosis not present

## 2021-10-15 DIAGNOSIS — R600 Localized edema: Secondary | ICD-10-CM

## 2021-10-15 DIAGNOSIS — I700 Atherosclerosis of aorta: Secondary | ICD-10-CM | POA: Diagnosis not present

## 2021-10-15 DIAGNOSIS — R609 Edema, unspecified: Secondary | ICD-10-CM | POA: Diagnosis not present

## 2021-10-15 DIAGNOSIS — G620 Drug-induced polyneuropathy: Secondary | ICD-10-CM

## 2021-10-15 DIAGNOSIS — T451X5A Adverse effect of antineoplastic and immunosuppressive drugs, initial encounter: Secondary | ICD-10-CM | POA: Diagnosis not present

## 2021-10-15 MED ORDER — METFORMIN HCL ER 500 MG PO TB24: 500.0000 mg | ORAL_TABLET | Freq: Every day | ORAL | 1 refills | Status: DC

## 2021-10-15 NOTE — Assessment & Plan Note (Signed)
Taking lovastatin 20 mg daily and encouraged to continue despite cholesterol levels.  ?

## 2021-10-15 NOTE — Patient Instructions (Addendum)
We have sent in metformin to take 1 pill daily for the first two weeks then increase to 2 pills daily if doing well. ? ? ?

## 2021-10-15 NOTE — Assessment & Plan Note (Signed)
Taking lovastatin 20 mg daily and will continue. Encouraged her that this lowers her risk of CV disease and will be indicated for life. She understands and agrees to continue.  ?

## 2021-10-15 NOTE — Assessment & Plan Note (Signed)
Not adequately controlled and she was previously on actos/metformin. Insurance would no longer cover and she was taken off metformin for some reason. No side effects and adequate renal function. She was tried on jardiance and then [place] which she did not tolerate due to GI side effects. We will keep actos 15 mg daily (although I suspect this is causing her edema) and add metformin 500 mg xr daily, after 2 weeks increase to 1000 mg xr daily. Return in 3 months for Hga1c. We also discussed rybelsus if needed and we could stop actos if able. She is on ACE-I and statin. ?

## 2021-10-15 NOTE — Progress Notes (Signed)
? ?  Subjective:  ? ?Patient ID: [name], female    DOB: [birth year], 76 y.o.   MRN: [id number] ? ?HPI ?The patient is a TOC coming in for ongoing care and especially diabetes care which has not been adequate in recent years.  ? ?PMH, [facility], social history reviewed and updated ? ?Review of Systems  ?Constitutional: Negative.   ?HENT: Negative.    ?Eyes: Negative.   ?Respiratory:  Negative for cough, chest tightness and shortness of breath.   ?Cardiovascular:  Positive for leg swelling. Negative for chest pain and palpitations.  ?Gastrointestinal:  Negative for abdominal distention, abdominal pain, constipation, diarrhea, nausea and vomiting.  ?Musculoskeletal:  Positive for arthralgias.  ?Skin: Negative.   ?Neurological: Negative.   ?Psychiatric/Behavioral: Negative.    ? ?Objective:  ?Physical Exam ?Constitutional:   ?   Appearance: She is well-developed. She is obese.  ?HENT:  ?   Head: Normocephalic and atraumatic.  ?Cardiovascular:  ?   Rate and Rhythm: Normal rate and regular rhythm.  ?Pulmonary:  ?   Effort: Pulmonary effort is normal. No respiratory distress.  ?   Breath sounds: Normal breath sounds. No wheezing or rales.  ?Abdominal:  ?   General: Bowel sounds are normal. There is no distension.  ?   Palpations: Abdomen is soft.  ?   Tenderness: There is no abdominal tenderness. There is no rebound.  ?Musculoskeletal:  ?   Cervical back: Normal range of motion.  ?   Right lower leg: Edema present.  ?   Left lower leg: Edema present.  ?   Comments: 1+ edema  ?Skin: ?   General: Skin is warm and dry.  ?Neurological:  ?   Mental Status: She is alert and oriented to person, place, and time.  ?   Coordination: Coordination normal.  ? ? ?Vitals:  ? 10/15/21 1102  ?BP: 124/62  ?Pulse: 80  ?Resp: 18  ?SpO2: 98%  ?Weight: 230 lb 3.2 oz (104.4 kg)  ?Height: '5\' 5"'$  (1.651 m)  ? ? ?This visit occurred during the SARS-CoV-2 public health emergency.  Safety protocols were in place, including screening questions prior to the  visit, additional usage of staff PPE, and extensive cleaning of exam room while observing appropriate contact time as indicated for disinfecting solutions.  ? ?Assessment & Plan:  ?Visit time 35 minutes in face to face communication with patient and coordination of care, additional 15 minutes spent in record review, coordination or care, ordering tests, communicating/referring to other healthcare professionals, documenting in medical records all on the same day of the visit for total time 50 minutes spent on the visit.  ? ?

## 2021-10-15 NOTE — Assessment & Plan Note (Signed)
Suspect that actos 15 mg daily could be contributing along with weight. She is working on losing weight and once diabetes under control we will work to stop actos and replace with a different agent or continue increase dosing of metformin as needed.  ?

## 2021-10-15 NOTE — Assessment & Plan Note (Signed)
She is working on weight loss and down about 20-30 pounds since December. We will add metformin 500 mg daily for 2 weeks then increase to 1000 mg daily to help aid weight loss.  ?

## 2021-10-15 NOTE — Assessment & Plan Note (Signed)
Overall stable not on medications currently.  ?

## 2021-10-20 ENCOUNTER — Ambulatory Visit
Admission: RE | Admit: 2021-10-20 | Discharge: 2021-10-20 | Disposition: A | Discharge status: Home / Self Care | Payer: PPO | Source: Ambulatory Visit | Attending: Hematology and Oncology | Admitting: Hematology and Oncology

## 2021-10-20 DIAGNOSIS — Z1231 Encounter for screening mammogram for malignant neoplasm of breast: Secondary | ICD-10-CM | POA: Diagnosis not present

## 2021-10-20 DIAGNOSIS — C7800 Secondary malignant neoplasm of unspecified lung: Secondary | ICD-10-CM

## 2021-11-03 DIAGNOSIS — H35351 Cystoid macular degeneration, right eye: Secondary | ICD-10-CM | POA: Diagnosis not present

## 2021-11-03 DIAGNOSIS — H20013 Primary iridocyclitis, bilateral: Secondary | ICD-10-CM | POA: Diagnosis not present

## 2021-11-16 DIAGNOSIS — H0016 Chalazion left eye, unspecified eyelid: Secondary | ICD-10-CM | POA: Diagnosis not present

## 2021-11-16 DIAGNOSIS — H20013 Primary iridocyclitis, bilateral: Secondary | ICD-10-CM | POA: Diagnosis not present

## 2021-11-16 DIAGNOSIS — H353132 Nonexudative age-related macular degeneration, bilateral, intermediate dry stage: Secondary | ICD-10-CM | POA: Diagnosis not present

## 2021-11-16 DIAGNOSIS — H35351 Cystoid macular degeneration, right eye: Secondary | ICD-10-CM | POA: Diagnosis not present

## 2021-11-19 ENCOUNTER — Other Ambulatory Visit: Payer: Self-pay | Admitting: Hematology and Oncology

## 2021-11-19 MED ORDER — PALBOCICLIB 75 MG PO TABS: 75.0000 mg | ORAL_TABLET | Freq: Every day | ORAL | 3 refills | Status: DC

## 2021-12-06 DIAGNOSIS — H04123 Dry eye syndrome of bilateral lacrimal glands: Secondary | ICD-10-CM | POA: Diagnosis not present

## 2021-12-06 DIAGNOSIS — Z961 Presence of intraocular lens: Secondary | ICD-10-CM | POA: Diagnosis not present

## 2021-12-06 DIAGNOSIS — H43813 Vitreous degeneration, bilateral: Secondary | ICD-10-CM | POA: Diagnosis not present

## 2021-12-06 DIAGNOSIS — H401133 Primary open-angle glaucoma, bilateral, severe stage: Secondary | ICD-10-CM | POA: Diagnosis not present

## 2021-12-07 DIAGNOSIS — E119 Type 2 diabetes mellitus without complications: Secondary | ICD-10-CM | POA: Diagnosis not present

## 2021-12-07 DIAGNOSIS — H401134 Primary open-angle glaucoma, bilateral, indeterminate stage: Secondary | ICD-10-CM | POA: Diagnosis not present

## 2021-12-07 DIAGNOSIS — Z961 Presence of intraocular lens: Secondary | ICD-10-CM | POA: Diagnosis not present

## 2021-12-07 DIAGNOSIS — H04123 Dry eye syndrome of bilateral lacrimal glands: Secondary | ICD-10-CM | POA: Diagnosis not present

## 2021-12-08 DIAGNOSIS — H209 Unspecified iridocyclitis: Secondary | ICD-10-CM | POA: Diagnosis not present

## 2021-12-08 DIAGNOSIS — H401134 Primary open-angle glaucoma, bilateral, indeterminate stage: Secondary | ICD-10-CM | POA: Diagnosis not present

## 2021-12-08 DIAGNOSIS — Z961 Presence of intraocular lens: Secondary | ICD-10-CM | POA: Diagnosis not present

## 2021-12-08 DIAGNOSIS — T85698A Other mechanical complication of other specified internal prosthetic devices, implants and grafts, initial encounter: Secondary | ICD-10-CM | POA: Diagnosis not present

## 2021-12-08 DIAGNOSIS — E119 Type 2 diabetes mellitus without complications: Secondary | ICD-10-CM | POA: Diagnosis not present

## 2021-12-09 ENCOUNTER — Telehealth: Payer: Self-pay | Admitting: Hematology and Oncology

## 2021-12-09 NOTE — Telephone Encounter (Signed)
Per 6/29 phone lines pt called and stated that she was having surgery on the day of her appointment and requested to cancel and said she would call back at a later date to reschedule.  Appointment was canceled per pt request and nurse was notified

## 2021-12-13 ENCOUNTER — Ambulatory Visit: Payer: PPO | Admitting: Hematology and Oncology

## 2021-12-13 ENCOUNTER — Other Ambulatory Visit: Payer: PPO

## 2021-12-13 DIAGNOSIS — Z885 Allergy status to narcotic agent status: Secondary | ICD-10-CM | POA: Diagnosis not present

## 2021-12-13 DIAGNOSIS — Z853 Personal history of malignant neoplasm of breast: Secondary | ICD-10-CM | POA: Diagnosis not present

## 2021-12-13 DIAGNOSIS — G4733 Obstructive sleep apnea (adult) (pediatric): Secondary | ICD-10-CM | POA: Diagnosis not present

## 2021-12-13 DIAGNOSIS — Z79899 Other long term (current) drug therapy: Secondary | ICD-10-CM | POA: Diagnosis not present

## 2021-12-13 DIAGNOSIS — H209 Unspecified iridocyclitis: Secondary | ICD-10-CM | POA: Diagnosis not present

## 2021-12-13 DIAGNOSIS — H401113 Primary open-angle glaucoma, right eye, severe stage: Secondary | ICD-10-CM | POA: Diagnosis not present

## 2021-12-13 DIAGNOSIS — Z7984 Long term (current) use of oral hypoglycemic drugs: Secondary | ICD-10-CM | POA: Diagnosis not present

## 2021-12-13 DIAGNOSIS — H35132 Retinopathy of prematurity, stage 2, left eye: Secondary | ICD-10-CM | POA: Diagnosis not present

## 2021-12-13 DIAGNOSIS — I10 Essential (primary) hypertension: Secondary | ICD-10-CM | POA: Diagnosis not present

## 2021-12-13 DIAGNOSIS — E119 Type 2 diabetes mellitus without complications: Secondary | ICD-10-CM | POA: Diagnosis not present

## 2021-12-13 DIAGNOSIS — Z7982 Long term (current) use of aspirin: Secondary | ICD-10-CM | POA: Diagnosis not present

## 2021-12-13 DIAGNOSIS — Z882 Allergy status to sulfonamides status: Secondary | ICD-10-CM | POA: Diagnosis not present

## 2021-12-13 DIAGNOSIS — T85318A Breakdown (mechanical) of other ocular prosthetic devices, implants and grafts, initial encounter: Secondary | ICD-10-CM | POA: Diagnosis not present

## 2021-12-13 DIAGNOSIS — Z888 Allergy status to other drugs, medicaments and biological substances status: Secondary | ICD-10-CM | POA: Diagnosis not present

## 2021-12-13 DIAGNOSIS — H532 Diplopia: Secondary | ICD-10-CM | POA: Diagnosis not present

## 2021-12-13 DIAGNOSIS — Z8673 Personal history of transient ischemic attack (TIA), and cerebral infarction without residual deficits: Secondary | ICD-10-CM | POA: Diagnosis not present

## 2021-12-13 DIAGNOSIS — J45909 Unspecified asthma, uncomplicated: Secondary | ICD-10-CM | POA: Diagnosis not present

## 2021-12-13 DIAGNOSIS — T85698A Other mechanical complication of other specified internal prosthetic devices, implants and grafts, initial encounter: Secondary | ICD-10-CM | POA: Diagnosis not present

## 2021-12-13 DIAGNOSIS — T85398A Other mechanical complication of other ocular prosthetic devices, implants and grafts, initial encounter: Secondary | ICD-10-CM | POA: Diagnosis not present

## 2021-12-13 DIAGNOSIS — T85390A Other mechanical complication of prosthetic orbit of right eye, initial encounter: Secondary | ICD-10-CM | POA: Diagnosis not present

## 2021-12-14 DIAGNOSIS — Z9889 Other specified postprocedural states: Secondary | ICD-10-CM | POA: Diagnosis not present

## 2021-12-21 ENCOUNTER — Telehealth: Payer: Self-pay | Admitting: *Deleted

## 2021-12-21 NOTE — Telephone Encounter (Signed)
This RN spoke with pt who called needed to clarify appts- she had to cancel last week due to emergency eye surgery which she is still recovering her vision.  She needs appts around 7/31 per restart of next cycle of Ibrance.  This RN rescheduled appropriately.  Note [name] states she also will need refills for the Ibrance to carry her out to the end of the year.  She states [name] is " who is in charge now "  This RN will follow up with above.

## 2022-01-07 ENCOUNTER — Other Ambulatory Visit: Payer: Self-pay | Admitting: *Deleted

## 2022-01-07 DIAGNOSIS — Z170 Estrogen receptor positive status [ER+]: Secondary | ICD-10-CM

## 2022-01-07 DIAGNOSIS — R918 Other nonspecific abnormal finding of lung field: Secondary | ICD-10-CM

## 2022-01-10 ENCOUNTER — Inpatient Hospital Stay: Payer: PPO | Attending: Hematology and Oncology | Admitting: Hematology and Oncology

## 2022-01-10 ENCOUNTER — Inpatient Hospital Stay: Payer: PPO

## 2022-01-10 ENCOUNTER — Encounter: Payer: Self-pay | Admitting: Hematology and Oncology

## 2022-01-10 ENCOUNTER — Other Ambulatory Visit (HOSPITAL_COMMUNITY): Payer: Self-pay

## 2022-01-10 ENCOUNTER — Other Ambulatory Visit: Payer: Self-pay | Admitting: *Deleted

## 2022-01-10 ENCOUNTER — Other Ambulatory Visit: Payer: Self-pay

## 2022-01-10 VITALS — BP 183/62 | HR 63 | Temp 98.2°F | Resp 16 | Ht 65.0 in | Wt 231.7 lb

## 2022-01-10 DIAGNOSIS — E278 Other specified disorders of adrenal gland: Secondary | ICD-10-CM | POA: Diagnosis not present

## 2022-01-10 DIAGNOSIS — C50411 Malignant neoplasm of upper-outer quadrant of right female breast: Secondary | ICD-10-CM

## 2022-01-10 DIAGNOSIS — Z79811 Long term (current) use of aromatase inhibitors: Secondary | ICD-10-CM | POA: Insufficient documentation

## 2022-01-10 DIAGNOSIS — Z170 Estrogen receptor positive status [ER+]: Secondary | ICD-10-CM

## 2022-01-10 DIAGNOSIS — Z801 Family history of malignant neoplasm of trachea, bronchus and lung: Secondary | ICD-10-CM | POA: Insufficient documentation

## 2022-01-10 DIAGNOSIS — E041 Nontoxic single thyroid nodule: Secondary | ICD-10-CM | POA: Diagnosis not present

## 2022-01-10 DIAGNOSIS — Z853 Personal history of malignant neoplasm of breast: Secondary | ICD-10-CM | POA: Insufficient documentation

## 2022-01-10 DIAGNOSIS — R918 Other nonspecific abnormal finding of lung field: Secondary | ICD-10-CM

## 2022-01-10 DIAGNOSIS — J984 Other disorders of lung: Secondary | ICD-10-CM | POA: Insufficient documentation

## 2022-01-10 DIAGNOSIS — Z803 Family history of malignant neoplasm of breast: Secondary | ICD-10-CM | POA: Insufficient documentation

## 2022-01-10 DIAGNOSIS — I10 Essential (primary) hypertension: Secondary | ICD-10-CM | POA: Insufficient documentation

## 2022-01-10 LAB — CBC WITH DIFFERENTIAL (CANCER CENTER ONLY)
Abs Immature Granulocytes: 0.01 10*3/uL (ref 0.00–0.07)
Basophils Absolute: 0 10*3/uL (ref 0.0–0.1)
Basophils Relative: 1 %
Eosinophils Absolute: 0 10*3/uL (ref 0.0–0.5)
Eosinophils Relative: 1 %
HCT: 37.9 % (ref 36.0–46.0)
Hemoglobin: 13.1 g/dL (ref 12.0–15.0)
Immature Granulocytes: 0 %
Lymphocytes Relative: 36 %
Lymphs Abs: 0.9 10*3/uL (ref 0.7–4.0)
MCH: 32.5 pg (ref 26.0–34.0)
MCHC: 34.6 g/dL (ref 30.0–36.0)
MCV: 94 fL (ref 80.0–100.0)
Monocytes Absolute: 0.3 10*3/uL (ref 0.1–1.0)
Monocytes Relative: 13 %
Neutro Abs: 1.2 10*3/uL — ABNORMAL LOW (ref 1.7–7.7)
Neutrophils Relative %: 49 %
Platelet Count: 162 10*3/uL (ref 150–400)
RBC: 4.03 MIL/uL (ref 3.87–5.11)
RDW: 14.9 % (ref 11.5–15.5)
WBC Count: 2.4 10*3/uL — ABNORMAL LOW (ref 4.0–10.5)
nRBC: 0 % (ref 0.0–0.2)

## 2022-01-10 LAB — CMP (CANCER CENTER ONLY)
ALT: 10 U/L (ref 0–44)
AST: 12 U/L — ABNORMAL LOW (ref 15–41)
Albumin: 4.1 g/dL (ref 3.5–5.0)
Alkaline Phosphatase: 63 U/L (ref 38–126)
Anion gap: 5 (ref 5–15)
BUN: 17 mg/dL (ref 8–23)
CO2: 26 mmol/L (ref 22–32)
Calcium: 9 mg/dL (ref 8.9–10.3)
Chloride: 107 mmol/L (ref 98–111)
Creatinine: 0.69 mg/dL (ref 0.44–1.00)
GFR, Estimated: >60 mL/min (ref >60)
Glucose, Bld: 205 mg/dL — ABNORMAL HIGH (ref 70–99)
Potassium: 4.3 mmol/L (ref 3.5–5.1)
Sodium: 138 mmol/L (ref 135–145)
Total Bilirubin: 0.5 mg/dL (ref 0.3–1.2)
Total Protein: 6.8 g/dL (ref 6.5–8.1)

## 2022-01-10 MED ORDER — PALBOCICLIB 75 MG PO TABS
75.0000 mg | ORAL_TABLET | Freq: Every day | ORAL | 4 refills | Status: DC
  Filled 2022-01-10: qty 21, 21d supply, fill #0

## 2022-01-10 NOTE — Progress Notes (Signed)
[facility]  [phone] [phone]     ID: [name] DOB: 10/30/1945  MR#: [id number]  XYV#:[id number]  Patient Care Team: Shannon Koch, MD as PCP - General (Internal Medicine) Shannon Overall, MD as Consulting Physician (General Surgery) Magrinat, Virgie Dad, MD (Inactive) as Consulting Physician (Oncology) Shannon Rudd, MD as Consulting Physician (Radiation Oncology) Shannon Charleston, MD as Consulting Physician (Obstetrics and Gynecology) Shannon Libman, MD as Referring Physician (Specialist) Shannon Gobble, MD as Consulting Physician (Pulmonary Disease) Shannon Frock, MD as Consulting Physician (Urology) [name], Shannon Beers, MD as Referring Physician (Ophthalmology) [name], RPH-CPP (Pharmacist) OTHER MD:  CHIEF COMPLAINT: Estrogen receptor positive breast cancer  CURRENT TREATMENT: Letrozole, palbociclib   INTERVAL HISTORY:  [name] returns today for follow-up of her estrogen receptor positive stage IV breast cancer.  She Obrien has been tolerating Ibrance and letrozole very well.  No adverse effects. She has had in the past couple weeks she has noticed some difficulty swallowing pills but she does not think it has gone long enough to worry about.  She will keep an eye on it and let us know if this has progressed.  She otherwise denies any changes in her health. Rest of the pertinent 10 point ROS reviewed and negative.  We are continuing to follow her tumor marker: Lab Results  Component Value Date   CA2729 35.5 09/28/2021   CA2729 37.1 06/29/2021   CA2729 43.1 (H) 04/28/2021   CA2729 37.2 01/26/2021   CA2729 32.3 09/18/2020    REVIEW OF SYSTEMS:   A detailed review of systems today was otherwise stable.   COVID 19 VACCINATION STATUS: Status post Pfizer x2 followed by booster August 2021; had COVID November 2022   BREAST CANCER HISTORY: From the original intake note:  [name] had screening mammography showing  some suspicious calcifications in the right breast leading to right diagnostic mammography with ultrasonography 01/22/2016 at [facility]. The breast density was category C. In the upper right breast there was a 2.3 cm mass with additional masses measuring 0.9 and 0.7 cm. There was also a possible additional 0.8 mass in the lower inner quadrant. Ultrasound confirmed an irregular hypoechoic mass in the right breast upper outer quadrant measuring 2.0 cm. There were other masses measuring 0.7 and 0.8 cm by ultrasonography. The right axilla was sonographically benign.  Biopsy of a 12:00 and 4:00 mass in the right breast 01/22/2016 showed ([id number]) both specimens showing invasive ductal carcinoma, grade 1 or 2, both 95% estrogen receptor positive, both 95% progesterone receptor positive, both with strong staining intensity, with MIB-1 ranging from 10-15%, and both HER-2 negative, the signals ratio being 1.23-1.42, and the number per cell 1.85-2.59.  Her subsequent history is as detailed below   PAST MEDICAL HISTORY: Past Medical History:  Diagnosis Date   Breast cancer ([place])    Cancer ([place]) 02/2016   right breast   DIABETES MELLITUS, TYPE II 01/04/2007   only takes actoplus daily   Dizziness and giddiness 02/29/2008   DVT, HX OF    at age 72 in right buttocks   Dyspnea    due to lung cancer   Family history of breast cancer    GERD 01/04/2007   pt reports resolved    GLAUCOMA 07/30/2008   both eyes   History of blood transfusion    no abnormal  reaction   History of uterine cancer 2000   hysterectomy done   HYPERLIPIDEMIA 01/04/2007   taking Pravastatin daily  HYPERTENSION 01/04/2007   takes Lisinopril daily   Joint pain    Joint swelling    Leg cramps    LEG PAIN, LEFT 07/06/2007   NUMBNESS 07/30/2008   in fingers;pt states from [facility]   OSTEOARTHRITIS, HIP 09/25/2009   OTITIS MEDIA, ACUTE, BILATERAL 02/29/2008   Overweight(278.02) 01/04/2007   Peripheral vascular disease ([place])     Personal history of radiation therapy 2018   Pneumonia    PONV (postoperative nausea and vomiting)    SLEEP APNEA, OBSTRUCTIVE    doesn't use a cpap;study done about 27yrs ago   [place], HX OF 01/04/2007   Vision loss    left eye    PAST SURGICAL HISTORY: Past Surgical History:  Procedure Laterality Date   ABDOMINAL HYSTERECTOMY  2000   BREAST LUMPECTOMY Right 01/13/2017   x2   BREAST LUMPECTOMY WITH RADIOACTIVE SEED AND SENTINEL LYMPH NODE BIOPSY Right 01/13/2017   Procedure: RIGHT BREAST RADIOACTIVE SEED X'S 2 GUIDED LUMPECTOMY WITH RADIOACTIVE SEED TARGETED AXILLARYLYMPH NODE EXCISION AND RIGHT AXILLARY SENTINEL LYMPH NODE BIOPSY;  Surgeon: Shannon Overall, MD;  Location: [facility];  Service: General;  Laterality: Right;  2 SEEDS IN RIGHT BREAST 1 SEED IN RIGHT AXILLARY NODE   CHOLECYSTECTOMY     ENDOBRONCHIAL ULTRASOUND Bilateral 03/28/2016   Procedure: ENDOBRONCHIAL ULTRASOUND;  Surgeon: Shannon Gobble, MD;  Location: [facility];  Service: Cardiopulmonary;  Laterality: Bilateral;   EYE SURGERY  13   shunt left and lazer eye surgery on right cataract and retenia tear with repair   growth removal  2004   from thumb   KNEE ARTHROSCOPY Right    mulitple eye surgeries     both eyes, cataracts with ioc done both eyes   OOPHORECTOMY     right lumpectomy with axillary node dissection Right 01/2017   TOTAL HIP ARTHROPLASTY  06/24/2011   Procedure: TOTAL HIP ARTHROPLASTY;  Surgeon: Kerin Salen;  Location: [facility];  Service: Orthopedics;  Laterality: Right;   TOTAL HIP ARTHROPLASTY Left 11/12/2012   Dr Mayer Camel   TOTAL HIP ARTHROPLASTY Left 11/12/2012   Procedure: TOTAL HIP ARTHROPLASTY;  Surgeon: Kerin Salen, MD;  Location: [facility];  Service: Orthopedics;  Laterality: Left;  DEPUY PINNACLE    FAMILY HISTORY Family History  Problem Relation Age of Onset   Breast cancer Mother 24   Dementia Mother    Cancer Mother        Breast and lung cancer   Stroke Sister    Breast  cancer Sister 24   Heart attack Maternal Aunt    Lung cancer Maternal Grandmother        non smoker   Glaucoma Maternal Grandfather    Anesthesia problems Neg Hx   The patient's father died at age 9, the patient's mother died at age 65. She had breast and lung cancers diagnosed shortly before her death. The patient had no brothers, 2 sisters. One sister was diagnosed with breast cancer at the age of 34.   GYNECOLOGIC HISTORY:  No LMP recorded. Patient has had a hysterectomy. Menarche age 45, first live birth age 32, the patient is GX P1. She had a hysterectomy for endometrial cancer in the year 2000. She did not take hormone replacement. She did use oral contraceptives for more than 20 years remotely, with no complications.   SOCIAL HISTORY: (Updated July 2021). 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 She is  home with [name]. He is a retired Dealer.Their son [name] also lives in [place].  The patient has 3 grandchildren aged 11, 1 and 77    ADVANCED DIRECTIVES: In place   HEALTH MAINTENANCE: Social History   Tobacco Use   Smoking status: Never   Smokeless tobacco: Never  Vaping Use   Vaping Use: Never used  Substance Use Topics   Alcohol use: No   Drug use: No     Colonoscopy: Never  PAP: Status post hysterectomy  Bone density: Remote   Allergies  Allergen Reactions   Codeine Hives    Hycodan syrup   Fluorescein Nausea And Vomiting    ? IV dye for retina specialist   Lipitor [Atorvastatin Calcium]     Leg cramp   Oxycodone Nausea And Vomiting    Patient vomited for 3 days after taking   Sitagliptin Phosphate Nausea And Vomiting   Sulfa Drugs Cross Reactors Nausea And Vomiting    Current Outpatient Medications  Medication Sig Dispense Refill   acetaminophen (TYLENOL) 500 MG tablet Take 1,000 mg by mouth every 4 (four) hours as needed for moderate pain or fever.     aspirin EC  81 MG tablet Take 81 mg by mouth daily at 6 PM. 1700     Biotin 1 MG CAPS Take by mouth.     brimonidine (ALPHAGAN) 0.2 % ophthalmic solution INSTILL 1 DROP INTO EACH EYE THREE TIMES DAILY     brimonidine-timolol (COMBIGAN) 0.2-0.5 % ophthalmic solution      cholecalciferol (VITAMIN D) 1000 units tablet Take 1,000 Units by mouth daily.     dorzolamide-timolol (COSOPT) 22.3-6.8 MG/ML ophthalmic solution Place 1 drop into both eyes 2 (two) times daily.     ketorolac (ACULAR) 0.5 % ophthalmic solution SMARTSIG:In Eye(s)     letrozole (FEMARA) 2.5 MG tablet TAKE 1 TABLET BY MOUTH AT BEDTIME. 90 tablet 4   lisinopril (ZESTRIL) 5 MG tablet Take 5 mg by mouth daily.     lovastatin (MEVACOR) 20 MG tablet 1 tab by mouth every other bedtime 45 tablet 3   metFORMIN (GLUCOPHAGE-XR) 500 MG 24 hr tablet Take 1-2 tablets (500-1,000 mg total) by mouth daily with breakfast. 180 tablet 1   Netarsudil-Latanoprost (ROCKLATAN) 0.02-0.005 % SOLN Apply to eye.     palbociclib (IBRANCE) 75 MG tablet Take 1 tablet (75 mg total) by mouth daily. Take for 21 days on, 7 days off, repeat every 28 days. 21 tablet 3   pioglitazone (ACTOS) 15 MG tablet Take 1 tablet (15 mg total) by mouth daily. 90 tablet 3   prednisoLONE acetate (PRED FORTE) 1 % ophthalmic suspension SMARTSIG:1 In Eye(s) 6 Times Daily     No current facility-administered medications for this visit.     OBJECTIVE: white woman in no acute distress  Vitals:   01/10/22 1244  BP: (!) 183/62  Pulse: 63  Resp: 16  Temp: 98.2 F (36.8 C)  SpO2: 100%     Wt Readings from Last 3 Encounters:  01/10/22 231 lb 11.2 oz (105.1 kg)  10/15/21 230 lb 3.2 oz (104.4 kg)  09/28/21 234 lb 4.8 oz (106.3 kg)   Body mass index is 38.56 kg/m.    ECOG FS:1 - Symptomatic but completely ambulatory  Physical Exam Constitutional:      Appearance: Normal appearance.  Cardiovascular:     Rate and Rhythm: Normal rate and regular rhythm.     Pulses: Normal pulses.      Heart sounds: Normal  heart sounds.  Pulmonary:     Effort: Pulmonary effort is normal.     Breath sounds: Normal breath sounds.  Musculoskeletal:        General: Swelling (Baseline lower extremity swelling with some varicose veins) present. Normal range of motion.     Cervical back: Normal range of motion and neck supple. No rigidity.  Lymphadenopathy:     Cervical: No cervical adenopathy.  Skin:    General: Skin is warm and dry.  Neurological:     General: No focal deficit present.     Mental Status: She is alert.  Psychiatric:        Mood and Affect: Mood normal.      LAB RESULTS:  CMP     Component Value Date/Time   NA 138 01/10/2022 1223   NA 140 05/29/2017 1254   K 4.3 01/10/2022 1223   K 4.4 05/29/2017 1254   CL 107 01/10/2022 1223   CO2 26 01/10/2022 1223   CO2 25 05/29/2017 1254   GLUCOSE 205 (H) 01/10/2022 1223   GLUCOSE 154 (H) 05/29/2017 1254   BUN 17 01/10/2022 1223   BUN 11.5 05/29/2017 1254   CREATININE 0.69 01/10/2022 1223   CREATININE 0.8 05/29/2017 1254   CALCIUM 9.0 01/10/2022 1223   CALCIUM 9.3 05/29/2017 1254   PROT 6.8 01/10/2022 1223   PROT 7.0 05/29/2017 1254   ALBUMIN 4.1 01/10/2022 1223   ALBUMIN 4.1 05/29/2017 1254   AST 12 (L) 01/10/2022 1223   AST 13 05/29/2017 1254   ALT 10 01/10/2022 1223   ALT 14 05/29/2017 1254   ALKPHOS 63 01/10/2022 1223   ALKPHOS 66 05/29/2017 1254   BILITOT 0.5 01/10/2022 1223   BILITOT 0.50 05/29/2017 1254   GFRNONAA >60 01/10/2022 1223   GFRAA >60 03/10/2020 1138    INo results found for: "SPEP", "UPEP"  Lab Results  Component Value Date   WBC 2.4 (L) 01/10/2022   NEUTROABS 1.2 (L) 01/10/2022   HGB 13.1 01/10/2022   HCT 37.9 01/10/2022   MCV 94.0 01/10/2022   PLT 162 01/10/2022      Chemistry      Component Value Date/Time   NA 138 01/10/2022 1223   NA 140 05/29/2017 1254   K 4.3 01/10/2022 1223   K 4.4 05/29/2017 1254   CL 107 01/10/2022 1223   CO2 26 01/10/2022 1223   CO2 25  05/29/2017 1254   BUN 17 01/10/2022 1223   BUN 11.5 05/29/2017 1254   CREATININE 0.69 01/10/2022 1223   CREATININE 0.8 05/29/2017 1254      Component Value Date/Time   CALCIUM 9.0 01/10/2022 1223   CALCIUM 9.3 05/29/2017 1254   ALKPHOS 63 01/10/2022 1223   ALKPHOS 66 05/29/2017 1254   AST 12 (L) 01/10/2022 1223   AST 13 05/29/2017 1254   ALT 10 01/10/2022 1223   ALT 14 05/29/2017 1254   BILITOT 0.5 01/10/2022 1223   BILITOT 0.50 05/29/2017 1254       No results found for: "LABCA2"  No components found for: "LABCA125"  No results for input(s): "INR" in the last 168 hours.  Urinalysis    Component Value Date/Time   COLORURINE YELLOW 08/18/2017 1322   APPEARANCEUR HAZY (A) 08/18/2017 1322   LABSPEC 1.010 08/18/2017 1322   PHURINE 7.0 08/18/2017 1322   GLUCOSEU NEGATIVE 08/18/2017 1322   GLUCOSEU NEGATIVE 10/30/2014 1513   HGBUR LARGE (A) 08/18/2017 1322   BILIRUBINUR NEGATIVE 08/18/2017 1322   KETONESUR NEGATIVE 08/18/2017 1322  PROTEINUR 30 (A) 08/18/2017 1322   UROBILINOGEN 0.2 10/30/2014 1513   NITRITE NEGATIVE 08/18/2017 1322   LEUKOCYTESUR LARGE (A) 08/18/2017 1322    STUDIES: No results found.   ELIGIBLE FOR AVAILABLE RESEARCH PROTOCOL: no  ASSESSMENT: 76 y.o. [place] woman with a remote history of early stage endometrial cancer, subsequently status post right breast upper outer quadrant biopsy 01/22/2016 for a clinically multifocal T2 N0, stage 2A invasive ductal carcinoma, grade 1, estrogen and progesterone receptor positive, HER-2 negative, with an MIB-1 between 10 and 15%.  (1) right axillary lymph node biopsy 03/02/2016 positive  (2) genetics testing 01/13/2016 through the Custom gene panel offered by [facility] found no deleterious mutations in  ATM, BARD1, BRCA1, BRCA2, BRIP1, CDH1, CHEK2, EPCAM, FANCC, MLH1, MSH2, MSH6, MUTYH, NBN, PALB2, PMS2, POLD1, PTEN, RAD51C, RAD51D, TP53, and XRCC2  METASTATIC DISEASE: OCT 2017 (3) CT scans of the chest  abdomen and pelvis obtained 03/10/2016 are consistent with bilateral lung metastases and mediastinal and hilar nodal involvement, but no liver or bone spread  (a) bronchoscopic lymph node biopsy 2 (station 7, 13R) 03/28/2016 confirms metastatic adenocarcinoma, estrogen receptor positive, HER-2 not amplified  (b) baseline CA-27-29 on 04/18/2016 was 137.5.  (4) letrozole started 03/15/2016, palbociclib added 03/29/2016 at 125 mg/day, 21/7  (a) dose decreased to 100 mg per day, 21/7, beginning with February cycle  (b) palbociclib held 01/31/2017, with increasing symptoms  (c) palbociclib resumed October 2018 at 75 mg daily  (d) palbociclib dose reduced to 75 mg every other day February through April 2019  (e) palbociclib dose resumed at 75 mg daily as of 10/17/2017  (f) palbociclib dose decreased to 75 mg every other day beginning 07/31/2019  (g) palbociclib resumed at 75 mg daily, 21 days on 7 off, as of 08/25/2019  (5) status post double right lumpectomies and right axillary lymph node sampling 01/13/2017 for 2 separate invasive ductal carcinoma lesions, pT1a and pT1b, N1a, with negative margins, both lesions being estrogen and progesterone receptor positive and HER-2 negative  (6) adjuvant radiation completed 07/05/2017 1. 50.4 Gy in 28 fractions to the right breast and supraclavicular region using whole-breast tangent fields. 2. Boost to the seroma delivered an additional 10 Gy in 5 fractions. The total dose was 60.4 Gy.  (7) restaging studies:  (a) CT scan of the chest and bone scan 06/23/2017 showed stable scattered very small lung nodules, no bone lesions  (b) CT of the chest 10/17/2017 showed no new or progressive metastatic disease in the chest. The small left lower lobe pulmonary nodule is stable  (c) PET scan on 03/02/2018: shows no findings for residual or recurrent right breast cancer  (d) chest CT scan stable, questionable right renal cyst noted  (e) chest CT 09/18/2020 shows no  measurable disease  (f) to the CT scan 04/29/2021 shows no evidence of active disease; a 2 cm thyroid nodule was noted   (8) right upper pole renal lesion noted to be enlarging on CT scan 06/22/2017  (a) no uptake on PET scan obtained 03/02/2018  #9 Most recent imaging with no new suspicious mass or lymphadenopathy identified in the chest abdomen or pelvis.  Stable chronic pleural and parenchymal scarring densities in the right lung apex.  Stable chronic moderate to severe biliary ductal dilatation.  Stable chronic 11 mm left adrenal gland nodule.  No dedicated follow-up required   PLAN:  She continues on Ibrance 75 mg once daily 3 weeks on 1 week off and letrozole daily.   She has  been doing imaging every 6 months and following up with Dr. Jana Hakim every 3 months.   She denies any new complaints since last visit.  Requested a refill for Ibrance.  We have also recommended imaging every 6 months since this has been ordered for October.  Physical examination today without any concerns, deferred breast exam.  Recent mammogram from May without any concerns. She will return to clinic in October with repeat labs and imaging results.  All her questions were answered to the best of my knowledge. Thank you for consulting [place] in the care of this patient.  Please not hesitate contact us with any additional questions or concerns.  Total time spent: 30 minutes *Total Encounter Time as defined by the Centers for Medicare and Medicaid Services includes, in addition to the face-to-face time of a patient visit (documented in the note above) non-face-to-face time: obtaining and reviewing outside history, ordering and reviewing medications, tests or procedures, care coordination (communications with other health care professionals or caregivers) and documentation in the medical record.

## 2022-01-11 ENCOUNTER — Telehealth: Payer: Self-pay | Admitting: Hematology and Oncology

## 2022-01-11 LAB — CANCER ANTIGEN 27.29: CA 27.29: 34.1 U/mL (ref 0.0–38.6)

## 2022-01-11 NOTE — Telephone Encounter (Signed)
Per 8/1 phone line pt called to r/s appointment  r/s per pt request

## 2022-01-17 ENCOUNTER — Ambulatory Visit: Payer: PPO | Admitting: Internal Medicine

## 2022-01-18 ENCOUNTER — Other Ambulatory Visit: Payer: PPO

## 2022-01-18 ENCOUNTER — Ambulatory Visit: Payer: PPO | Admitting: Hematology and Oncology

## 2022-01-18 ENCOUNTER — Other Ambulatory Visit: Payer: Self-pay | Admitting: *Deleted

## 2022-01-18 MED ORDER — PALBOCICLIB 75 MG PO TABS: 75.0000 mg | ORAL_TABLET | Freq: Every day | ORAL | 4 refills | Status: DC

## 2022-01-25 ENCOUNTER — Ambulatory Visit: Payer: PPO | Admitting: Internal Medicine

## 2022-01-25 ENCOUNTER — Ambulatory Visit (INDEPENDENT_AMBULATORY_CARE_PROVIDER_SITE_OTHER): Payer: PPO | Admitting: Internal Medicine

## 2022-01-25 ENCOUNTER — Encounter: Payer: Self-pay | Admitting: Internal Medicine

## 2022-01-25 VITALS — BP 138/78 | HR 80 | Temp 97.6°F | Ht 65.0 in | Wt 228.0 lb

## 2022-01-25 DIAGNOSIS — E1169 Type 2 diabetes mellitus with other specified complication: Secondary | ICD-10-CM | POA: Diagnosis not present

## 2022-01-25 LAB — POCT GLYCOSYLATED HEMOGLOBIN (HGB A1C): Hemoglobin A1C: 7.5 % — AB (ref 4.0–5.6)

## 2022-01-25 NOTE — Progress Notes (Signed)
   Subjective:   Patient ID: [name], female    DOB: June 04, 1946, 77 y.o.   MRN: [id number]  HPI The patient is a 76 YO female coming in for follow up sugars.   Review of Systems  Constitutional: Negative.   HENT: Negative.    Eyes: Negative.   Respiratory:  Negative for cough, chest tightness and shortness of breath.   Cardiovascular:  Negative for chest pain, palpitations and leg swelling.  Gastrointestinal:  Negative for abdominal distention, abdominal pain, constipation, diarrhea, nausea and vomiting.  Musculoskeletal: Negative.   Skin: Negative.   Neurological: Negative.   Psychiatric/Behavioral: Negative.      Objective:  Physical Exam Constitutional:      Appearance: She is well-developed.  HENT:     Head: Normocephalic and atraumatic.  Cardiovascular:     Rate and Rhythm: Normal rate and regular rhythm.  Pulmonary:     Effort: Pulmonary effort is normal. No respiratory distress.     Breath sounds: Normal breath sounds. No wheezing or rales.  Abdominal:     General: Bowel sounds are normal. There is no distension.     Palpations: Abdomen is soft.     Tenderness: There is no abdominal tenderness. There is no rebound.  Musculoskeletal:     Cervical back: Normal range of motion.  Skin:    General: Skin is warm and dry.  Neurological:     Mental Status: She is alert and oriented to person, place, and time.     Coordination: Coordination normal.     Vitals:   01/25/22 1302  BP: 138/78  Pulse: 80  Temp: 97.6 F (36.4 C)  TempSrc: Temporal  SpO2: 96%  Weight: 228 lb (103.4 kg)  Height: '5\' 5"'$  (1.651 m)    Assessment & Plan:

## 2022-01-25 NOTE — Patient Instructions (Signed)
Stop the pioglitazone (actos). The HgA1c is 7.5 today which is great

## 2022-01-26 ENCOUNTER — Encounter (INDEPENDENT_AMBULATORY_CARE_PROVIDER_SITE_OTHER): Payer: PPO | Admitting: Ophthalmology

## 2022-01-26 DIAGNOSIS — H353132 Nonexudative age-related macular degeneration, bilateral, intermediate dry stage: Secondary | ICD-10-CM | POA: Diagnosis not present

## 2022-01-26 DIAGNOSIS — H33301 Unspecified retinal break, right eye: Secondary | ICD-10-CM

## 2022-01-26 DIAGNOSIS — I10 Essential (primary) hypertension: Secondary | ICD-10-CM

## 2022-01-26 DIAGNOSIS — H35033 Hypertensive retinopathy, bilateral: Secondary | ICD-10-CM | POA: Diagnosis not present

## 2022-01-26 DIAGNOSIS — H4311 Vitreous hemorrhage, right eye: Secondary | ICD-10-CM

## 2022-01-26 DIAGNOSIS — H43813 Vitreous degeneration, bilateral: Secondary | ICD-10-CM

## 2022-01-28 ENCOUNTER — Other Ambulatory Visit (HOSPITAL_COMMUNITY): Payer: Self-pay

## 2022-01-28 NOTE — Assessment & Plan Note (Signed)
POC HgA1c much improved today. We will stop actos (as this is likely causing leg swelling) and continue metformin 500 mg BID. Follow up 3 months.

## 2022-01-31 DIAGNOSIS — H401134 Primary open-angle glaucoma, bilateral, indeterminate stage: Secondary | ICD-10-CM | POA: Diagnosis not present

## 2022-01-31 DIAGNOSIS — Z9889 Other specified postprocedural states: Secondary | ICD-10-CM | POA: Diagnosis not present

## 2022-02-23 DIAGNOSIS — H04123 Dry eye syndrome of bilateral lacrimal glands: Secondary | ICD-10-CM | POA: Diagnosis not present

## 2022-02-23 DIAGNOSIS — H35351 Cystoid macular degeneration, right eye: Secondary | ICD-10-CM | POA: Diagnosis not present

## 2022-02-23 DIAGNOSIS — H43811 Vitreous degeneration, right eye: Secondary | ICD-10-CM | POA: Diagnosis not present

## 2022-02-23 DIAGNOSIS — H20013 Primary iridocyclitis, bilateral: Secondary | ICD-10-CM | POA: Diagnosis not present

## 2022-02-25 ENCOUNTER — Ambulatory Visit (INDEPENDENT_AMBULATORY_CARE_PROVIDER_SITE_OTHER): Payer: PPO

## 2022-02-25 DIAGNOSIS — Z0000 Encounter for general adult medical examination without abnormal findings: Secondary | ICD-10-CM

## 2022-02-25 NOTE — Patient Instructions (Signed)
[name] , Thank you for taking time to come for your Medicare Wellness Visit. I appreciate your ongoing commitment to your health goals. Please review the following plan we discussed and let me know if I can assist you in the future.   Screening recommendations/referrals: Colonoscopy: No longer recommended due to age Mammogram: 10/20/2021; due every year Bone Density: Never done Recommended yearly ophthalmology/optometry visit for glaucoma screening and checkup Recommended yearly dental visit for hygiene and checkup  Vaccinations: Influenza vaccine: 03/17/2021; due 03/2022 Pneumococcal vaccine: declined Tdap vaccine: declined Shingles vaccine: declined   Covid-19:07/03/2019, 07/24/2019, 02/27/2020, 09/10/2020, 04/19/2021  Advanced directives: Yes; Please bring a copy of your health care power of attorney and living will to the office at your convenience.  Conditions/risks identified: Yes  Next appointment: Follow up in one year for your annual wellness visit.   Preventive Care 76 Years and Older, Female Preventive care refers to lifestyle choices and visits with your health care provider that can promote health and wellness. What does preventive care include? A yearly physical exam. This is also called an annual well check. Dental exams once or twice a year. Routine eye exams. Ask your health care provider how often you should have your eyes checked. Personal lifestyle choices, including: Daily care of your teeth and gums. Regular physical activity. Eating a healthy diet. Avoiding tobacco and drug use. Limiting alcohol use. Practicing safe sex. Taking low-dose aspirin every day. Taking vitamin and mineral supplements as recommended by your health care provider. What happens during an annual well check? The services and screenings done by your health care provider during your annual well check will depend on your age, overall health, lifestyle risk factors, and family history of  disease. Counseling  Your health care provider may ask you questions about your: Alcohol use. Tobacco use. Drug use. Emotional well-being. Home and relationship well-being. Sexual activity. Eating habits. History of falls. Memory and ability to understand (cognition). Work and work Statistician. Reproductive health. Screening  You may have the following tests or measurements: Height, weight, and BMI. Blood pressure. Lipid and cholesterol levels. These may be checked every 5 years, or more frequently if you are over 57 years old. Skin check. Lung cancer screening. You may have this screening every year starting at age 48 if you have a 30-pack-year history of smoking and currently smoke or have quit within the past 15 years. Fecal occult blood test (FOBT) of the stool. You may have this test every year starting at age 52. Flexible sigmoidoscopy or colonoscopy. You may have a sigmoidoscopy every 5 years or a colonoscopy every 10 years starting at age 69. Hepatitis C blood test. Hepatitis B blood test. Sexually transmitted disease (STD) testing. Diabetes screening. This is done by checking your blood sugar (glucose) after you have not eaten for a while (fasting). You may have this done every 1-3 years. Bone density scan. This is done to screen for osteoporosis. You may have this done starting at age 76. Mammogram. This may be done every 1-2 years. Talk to your health care provider about how often you should have regular mammograms. Talk with your health care provider about your test results, treatment options, and if necessary, the need for more tests. Vaccines  Your health care provider may recommend certain vaccines, such as: Influenza vaccine. This is recommended every year. Tetanus, diphtheria, and acellular pertussis (Tdap, Td) vaccine. You may need a Td booster every 10 years. Zoster vaccine. You may need this after age 67. Pneumococcal  13-valent conjugate (PCV13) vaccine. One  dose is recommended after age 76. Pneumococcal polysaccharide (PPSV23) vaccine. One dose is recommended after age 58. Talk to your health care provider about which screenings and vaccines you need and how often you need them. This information is not intended to replace advice given to you by your health care provider. Make sure you discuss any questions you have with your health care provider. Document Released: 06/26/2015 Document Revised: 02/17/2016 Document Reviewed: 03/31/2015 Elsevier Interactive Patient Education  2017 [employer] [place] Prevention in the Home Falls can cause injuries. They can happen to people of all ages. There are many things you can do to make your home safe and to help prevent falls. What can I do on the outside of my home? Regularly fix the edges of walkways and driveways and fix any cracks. Remove anything that might make you trip as you walk through a door, such as a raised step or threshold. Trim any bushes or trees on the path to your home. Use bright outdoor lighting. Clear any walking paths of anything that might make someone trip, such as rocks or tools. Regularly check to see if handrails are loose or broken. Make sure that both sides of any steps have handrails. Any raised decks and porches should have guardrails on the edges. Have any leaves, snow, or ice cleared regularly. Use sand or salt on walking paths during winter. Clean up any spills in your garage right away. This includes oil or grease spills. What can I do in the bathroom? Use night lights. Install grab bars by the toilet and in the tub and shower. Do not use towel bars as grab bars. Use non-skid mats or decals in the tub or shower. If you need to sit down in the shower, use a plastic, non-slip stool. Keep the floor dry. Clean up any water that spills on the floor as soon as it happens. Remove soap buildup in the tub or shower regularly. Attach bath mats securely with double-sided  non-slip rug tape. Do not have throw rugs and other things on the floor that can make you trip. What can I do in the bedroom? Use night lights. Make sure that you have a light by your bed that is easy to reach. Do not use any sheets or blankets that are too big for your bed. They should not hang down onto the floor. Have a firm chair that has side arms. You can use this for support while you get dressed. Do not have throw rugs and other things on the floor that can make you trip. What can I do in the kitchen? Clean up any spills right away. Avoid walking on wet floors. Keep items that you use a lot in easy-to-reach places. If you need to reach something above you, use a strong step stool that has a grab bar. Keep electrical cords out of the way. Do not use floor polish or wax that makes floors slippery. If you must use wax, use non-skid floor wax. Do not have throw rugs and other things on the floor that can make you trip. What can I do with my stairs? Do not leave any items on the stairs. Make sure that there are handrails on both sides of the stairs and use them. Fix handrails that are broken or loose. Make sure that handrails are as long as the stairways. Check any carpeting to make sure that it is firmly attached to the stairs. Fix any carpet  that is loose or worn. Avoid having throw rugs at the top or bottom of the stairs. If you do have throw rugs, attach them to the floor with carpet tape. Make sure that you have a light switch at the top of the stairs and the bottom of the stairs. If you do not have them, ask someone to add them for you. What else can I do to help prevent falls? Wear shoes that: Do not have high heels. Have rubber bottoms. Are comfortable and fit you well. Are closed at the toe. Do not wear sandals. If you use a stepladder: Make sure that it is fully opened. Do not climb a closed stepladder. Make sure that both sides of the stepladder are locked into place. Ask  someone to hold it for you, if possible. Clearly mark and make sure that you can see: Any grab bars or handrails. First and last steps. Where the edge of each step is. Use tools that help you move around (mobility aids) if they are needed. These include: Canes. Walkers. Scooters. Crutches. Turn on the lights when you go into a dark area. Replace any light bulbs as soon as they burn out. Set up your furniture so you have a clear path. Avoid moving your furniture around. If any of your floors are uneven, fix them. If there are any pets around you, be aware of where they are. Review your medicines with your doctor. Some medicines can make you feel dizzy. This can increase your chance of falling. Ask your doctor what other things that you can do to help prevent falls. This information is not intended to replace advice given to you by your health care provider. Make sure you discuss any questions you have with your health care provider. Document Released: 03/26/2009 Document Revised: 11/05/2015 Document Reviewed: 07/04/2014 Elsevier Interactive Patient Education  2017 [employer].

## 2022-02-25 NOTE — Progress Notes (Signed)
Virtual Visit via Telephone Note  I connected with  [name] on 02/25/22 at  2:15 PM EDT by telephone and verified that I am speaking with the correct person using two identifiers.  Location: Patient: Home  Provider: [place] Persons participating in the virtual visit: [place]   I discussed the limitations, risks, security and privacy concerns of performing an evaluation and management service by telephone and the availability of in person appointments. The patient expressed understanding and agreed to proceed.  Interactive audio and video telecommunications were attempted between this nurse and patient, however failed, due to patient having technical difficulties OR patient did not have access to video capability.  We continued and completed visit with audio only.  Some vital signs may be absent or patient reported.   Sheral Flow, LPN  Subjective:   [name] is a 75 y.o. female who presents for Medicare Annual (Subsequent) preventive examination.  Review of Systems     Cardiac Risk Factors include: advanced age (>41mn, >>59women);dyslipidemia;family history of premature cardiovascular disease;hypertension;obesity (BMI >30kg/m2)     Objective:    There were no vitals filed for this visit. There is no height or weight on file to calculate BMI.     02/25/2022    2:20 PM 02/24/2021   11:25 AM 08/08/2017   10:27 AM 04/25/2017    1:10 PM 02/02/2017    8:25 AM 01/04/2017   11:00 AM 12/21/2016    1:46 PM  Advanced Directives  Does Patient Have a Medical Advance Directive? Yes Yes Yes Yes Yes Yes Yes  Type of AParamedicof [place]Grand LedgeLiving will Living will;Healthcare Power of [place] will [place] will  [place]Living will [place] will  Does patient want to make changes to medical advance directive?  No -  Patient declined       Copy of [place] Chart? No - copy requested No - copy requested Yes  Yes      Current Medications (verified) Outpatient Encounter Medications as of 02/25/2022  Medication Sig   acetaminophen (TYLENOL) 500 MG tablet Take 1,000 mg by mouth every 4 (four) hours as needed for moderate pain or fever.   aspirin EC 81 MG tablet Take 81 mg by mouth daily at 6 PM. 1700   Biotin 1 MG CAPS Take by mouth.   brimonidine (ALPHAGAN) 0.2 % ophthalmic solution INSTILL 1 DROP INTO EACH EYE THREE TIMES DAILY   cholecalciferol (VITAMIN D) 1000 units tablet Take 1,000 Units by mouth daily.   dorzolamide-timolol (COSOPT) 22.3-6.8 MG/ML ophthalmic solution Place 1 drop into both eyes 2 (two) times daily.   lisinopril (ZESTRIL) 5 MG tablet Take 5 mg by mouth daily.   lovastatin (MEVACOR) 20 MG tablet 1 tab by mouth every other bedtime   metFORMIN (GLUCOPHAGE-XR) 500 MG 24 hr tablet Take 1-2 tablets (500-1,000 mg total) by mouth daily with breakfast.   Netarsudil-Latanoprost (ROCKLATAN) 0.02-0.005 % SOLN Apply to eye.   palbociclib (IBRANCE) 75 MG tablet Take 1 tablet (75 mg total) by mouth daily. Take for 21 days on, 7 days off, repeat every 28 days.   pioglitazone (ACTOS) 15 MG tablet Take 1 tablet (15 mg total) by mouth daily.   prednisoLONE acetate (PRED FORTE) 1 % ophthalmic suspension SMARTSIG:1 In Eye(s) 6 Times Daily   No facility-administered encounter medications on file as of 02/25/2022.    Allergies (verified)  Codeine, Fluorescein, Lipitor [atorvastatin calcium], Oxycodone, Sitagliptin phosphate, and Sulfa drugs cross reactors   History: Past Medical History:  Diagnosis Date   Breast cancer ([place])    Cancer ([place]) 02/2016   right breast   DIABETES MELLITUS, TYPE II 01/04/2007   only takes actoplus daily   Dizziness and giddiness 02/29/2008   DVT, HX OF    at age 79 in right buttocks   Dyspnea    due to lung cancer   Family history of breast cancer     GERD 01/04/2007   pt reports resolved    GLAUCOMA 07/30/2008   both eyes   History of blood transfusion    no abnormal  reaction   History of uterine cancer 2000   hysterectomy done   HYPERLIPIDEMIA 01/04/2007   taking Pravastatin daily   HYPERTENSION 01/04/2007   takes Lisinopril daily   Joint pain    Joint swelling    Leg cramps    LEG PAIN, LEFT 07/06/2007   NUMBNESS 07/30/2008   in fingers;pt states from Diamox   OSTEOARTHRITIS, HIP 09/25/2009   OTITIS MEDIA, ACUTE, BILATERAL 02/29/2008   Overweight(278.02) 01/04/2007   Peripheral vascular disease ([place])    Personal history of radiation therapy 2018   Pneumonia    PONV (postoperative nausea and vomiting)    SLEEP APNEA, OBSTRUCTIVE    doesn't use a cpap;study done about 75yr ago   [place] HX OF 01/04/2007   Vision loss    left eye   Past Surgical History:  Procedure Laterality Date   ABDOMINAL HYSTERECTOMY  2000   BREAST LUMPECTOMY Right 01/13/2017   x2   BREAST LUMPECTOMY WITH RADIOACTIVE SEED AND SENTINEL LYMPH NODE BIOPSY Right 01/13/2017   Procedure: RIGHT BREAST RADIOACTIVE SEED X'S 2 GUIDED LUMPECTOMY WITH RADIOACTIVE SEED TARGETED AXILLARYLYMPH NODE EXCISION AND RIGHT AXILLARY SENTINEL LYMPH NODE BIOPSY;  Surgeon: NAlphonsa Overall MD;  [facility]  Service: General;  Laterality: Right;  2 SEEDS IN RIGHT BREAST 1 SEED IN RIGHT AXILLARY NODE   CHOLECYSTECTOMY     ENDOBRONCHIAL ULTRASOUND Bilateral 03/28/2016   Procedure: ENDOBRONCHIAL ULTRASOUND;  Surgeon: RCollene Gobble MD;  [facility]: [facility];  Service: Cardiopulmonary;  Laterality: Bilateral;   EYE SURGERY  13   shunt left and lazer eye surgery on right cataract and retenia tear with repair   growth removal  2004   from thumb   KNEE ARTHROSCOPY Right    mulitple eye surgeries     both eyes, cataracts with ioc done both eyes   OOPHORECTOMY     right lumpectomy with axillary node dissection Right 01/2017   TOTAL HIP ARTHROPLASTY   06/24/2011   Procedure: TOTAL HIP ARTHROPLASTY;  Surgeon: FKerin Salen  [facility]  Service: Orthopedics;  Laterality: Right;   TOTAL HIP ARTHROPLASTY Left 11/12/2012   Dr RMayer Camel  TOTAL HIP ARTHROPLASTY Left 11/12/2012   Procedure: TOTAL HIP ARTHROPLASTY;  Surgeon: FKerin Salen MD;  [facility]  Service: Orthopedics;  Laterality: Left;  DEPUY PINNACLE   Family History  Problem Relation Age of Onset   Breast cancer Mother 852  Dementia Mother    Cancer Mother        Breast and lung cancer   Stroke Sister    Breast cancer Sister 539  Heart attack Maternal Aunt    Lung cancer Maternal Grandmother        non smoker   Glaucoma Maternal Grandfather    Anesthesia problems  Neg Hx    Social History   Socioeconomic History   Marital status: Married    Spouse name: Not on file   Number of children: Not on file   Years of education: Not on file   Highest education level: Not on file  Occupational History   Occupation: office administrator    Employer: [employer]  Tobacco Use   Smoking status: Never   Smokeless tobacco: Never  Vaping Use   Vaping Use: Never used  Substance and Sexual Activity   Alcohol use: No   Drug use: No   Sexual activity: Not Currently  Other Topics Concern   Not on file  Social History Narrative   Not on file   Social Determinants of Health   Financial Resource Strain: Low Risk  (02/25/2022)   Overall Financial Resource Strain (CARDIA)    Difficulty of Paying Living Expenses: Not hard at all  Food Insecurity: No Food Insecurity (02/25/2022)   Hunger Vital Sign    Worried About Running Out of Food in the Last Year: Never true    [place] in the Last Year: Never true  Transportation Needs: No Transportation Needs (02/25/2022)   PRAPARE - Hydrologist (Medical): No    Lack of Transportation (Non-Medical): No  Physical Activity: Sufficiently Active (02/25/2022)   Exercise Vital Sign    Days of  Exercise per Week: 5 days    Minutes of Exercise per Session: 30 min  Stress: No Stress Concern Present (02/25/2022)   [place]    Feeling of Stress : Not at all  Social Connections: [place] (02/25/2022)   Social Connection and Isolation Panel [NHANES]    Frequency of Communication with Friends and Family: More than three times a week    Frequency of Social Gatherings with Friends and Family: More than three times a week    Attends Religious Services: More than 4 times per year    Active Member of [employer] or Organizations: Yes    Attends Music therapist: More than 4 times per year    Marital Status: Married    Tobacco Counseling Counseling given: Not Answered   Clinical Intake:  Pre-visit preparation completed: Yes  Pain : No/denies pain     BMI - recorded: 37.94 (01/25/2022) Nutritional Risks: None Diabetes: No  How often do you need to have someone help you when you read instructions, pamphlets, or other written materials from your doctor or pharmacy?: 1 - Never What is the last grade level you completed in school?: HSG; some college  Nutrition Risk Assessment:  Has the patient had any N/V/D within the last 2 months?  No  Does the patient have any non-healing wounds?  No  Has the patient had any unintentional weight loss or weight gain?  No   Diabetes:  Is the patient diabetic?  Yes  If diabetic, was a CBG obtained today?  No  Did the patient bring in their glucometer from home?  No  How often do you monitor your CBG's? no.   Financial Strains and Diabetes Management:  Are you having any financial strains with the device, your supplies or your medication? No .  Does the patient want to be seen by Chronic Care Management for management of their diabetes?  No  Would the patient like to be referred to a Nutritionist or for Diabetic Management?  No   Diabetic Exams:  Diabetic  Eye Exam: Completed: yes. Overdue for diabetic eye exam. Pt has been advised about the importance in completing this exam. A referral has been placed today. Message sent to referral coordinator for scheduling purposes. Advised pt to expect a call from office referred to regarding appt.  Diabetic Foot Exam: Completed 07/28/2021. Pt has been advised about the importance in completing this exam. Pt is scheduled for diabetic foot exam on 07/28/2022.    Interpreter Needed?: No  Information entered by :: Lisette Abu, LPN   Activities of Daily Living    02/25/2022    2:32 PM  In your present state of health, do you have any difficulty performing the following activities:  Hearing? 0  Vision? 0  Difficulty concentrating or making decisions? 0  Walking or climbing stairs? 0  Dressing or bathing? 0  Doing errands, shopping? 0  Preparing Food and eating ? N  Using the Toilet? N  In the past six months, have you accidently leaked urine? N  Do you have problems with loss of bowel control? N  Managing your Medications? N  Managing your Finances? N  Housekeeping or managing your Housekeeping? N    Patient Care Team: Hoyt Koch, MD as PCP - General (Internal Medicine) Alphonsa Overall, MD as Consulting Physician (General Surgery) Magrinat, Virgie Dad, MD (Inactive) as Consulting Physician (Oncology) Kyung Rudd, MD as Consulting Physician (Radiation Oncology) Bobbye Charleston, MD as Consulting Physician (Obstetrics and Gynecology) Nada Libman, MD as Referring Physician (Specialist) Collene Gobble, MD as Consulting Physician (Pulmonary Disease) Alexis Frock, MD as Consulting Physician (Urology) Ander Slade, Carlisle Beers, MD as Referring Physician (Ophthalmology) Raina Mina, RPH-CPP (Pharmacist) Corey Harold, MD as Consulting Physician (Ophthalmology)  Indicate any recent Medical Services you may have received from other than [facility] providers in the past year (date may be  approximate).     Assessment:   This is a routine wellness examination for [name].  Hearing/Vision screen Hearing Screening - Comments:: Denies hearing difficulties   Vision Screening - Comments:: Wears rx glasses - up to date with routine eye exams with Rolanda Jay, MD, Jovita Kussmaul, MD and Nada Libman, MD.  Dietary issues and exercise activities discussed: Current Exercise Habits: Home exercise routine, Type of exercise: walking, Time (Minutes): 30, Frequency (Times/Week): 5, Weekly Exercise (Minutes/Week): 150, Intensity: Moderate, Exercise limited by: None identified   Goals Addressed             This Visit's Progress    To lose weight.  I am already done 24 pounds.        Depression Screen    02/25/2022    2:28 PM 01/25/2022    1:17 PM 10/15/2021   11:11 AM 07/28/2021    1:47 PM 07/28/2021    1:08 PM 02/24/2021   11:28 AM 03/17/2020   11:25 AM  PHQ 2/9 Scores  PHQ - 2 Score 0 0 0 0 0 0 0  PHQ- 9 Score 2 2 0  0  0    Fall Risk    02/25/2022    2:21 PM 01/25/2022    1:17 PM 10/15/2021   11:11 AM 07/28/2021    1:46 PM 07/28/2021    1:09 PM  [place] in the past year? 0 0 0 1 1  Number falls in past yr: 0 0 0 0 0  Injury with Fall? 0 0 0 0 0  Risk for fall due to : No Fall Risks No  Fall Risks     Follow up Falls prevention discussed Falls evaluation completed       FALL RISK PREVENTION PERTAINING TO THE HOME:  Any stairs in or around the home? Yes  If so, are there any without handrails? No  Home free of loose throw rugs in walkways, pet beds, electrical cords, etc? Yes  Adequate lighting in your home to reduce risk of falls? Yes   ASSISTIVE DEVICES UTILIZED TO PREVENT FALLS:  Life alert? No  Use of a cane, walker or w/c? No  Grab bars in the bathroom? Yes  Shower chair or bench in shower? Yes  Elevated toilet seat or a handicapped toilet? Yes   TIMED UP AND GO:  Was the test performed? No .  Phone Visit  Cognitive Function:        02/25/2022     2:33 PM  6CIT Screen  What Year? 0 points  What month? 0 points  What time? 0 points  Count back from 20 0 points  Months in reverse 0 points  Repeat phrase 0 points  Total Score 0 points    Immunizations Immunization History  Administered Date(s) Administered   Influenza Split 04/05/2012, 03/16/2020   Influenza Whole 03/27/2006, 04/14/2008   Influenza, High Dose Seasonal PF 02/25/2019, 03/17/2021   Influenza,inj,Quad PF,6+ Mos 03/27/2014   Influenza-Unspecified 03/13/2013, 03/11/2015, 03/07/2016, 03/07/2017, 03/08/2018, 03/27/2018, 03/01/2019   PFIZER(Purple Top)SARS-COV-2 Vaccination 07/03/2019, 07/24/2019, 02/27/2020, 09/10/2020, 04/19/2021    TDAP status: Due, Education has been provided regarding the importance of this vaccine. Advised may receive this vaccine at local pharmacy or Health Dept. Aware to provide a copy of the vaccination record if obtained from local pharmacy or Health Dept. Verbalized acceptance and understanding.  Flu Vaccine status: Due, Education has been provided regarding the importance of this vaccine. Advised may receive this vaccine at local pharmacy or Health Dept. Aware to provide a copy of the vaccination record if obtained from local pharmacy or Health Dept. Verbalized acceptance and understanding.  Pneumococcal vaccine status: Due, Education has been provided regarding the importance of this vaccine. Advised may receive this vaccine at local pharmacy or Health Dept. Aware to provide a copy of the vaccination record if obtained from local pharmacy or Health Dept. Verbalized acceptance and understanding.  Covid-19 vaccine status: Completed vaccines  Qualifies for Shingles Vaccine? Yes   Zostavax completed No   Shingrix Completed?: No.    Education has been provided regarding the importance of this vaccine. Patient has been advised to call insurance company to determine out of pocket expense if they have not yet received this vaccine. Advised may also  receive vaccine at local pharmacy or Health Dept. Verbalized acceptance and understanding.  Screening Tests Health Maintenance  Topic Date Due   Hepatitis C Screening  Never done   TETANUS/TDAP  Never done   Zoster Vaccines- Shingrix (1 of 2) Never done   DEXA SCAN  Never done   Diabetic kidney evaluation - Urine ACR  10/30/2015   COVID-19 Vaccine (6 - Pfizer risk series) 06/14/2021   OPHTHALMOLOGY EXAM  11/30/2021   INFLUENZA VACCINE  01/11/2022   FOOT EXAM  07/28/2022   HEMOGLOBIN A1C  07/28/2022   Diabetic kidney evaluation - GFR measurement  01/11/2023   HPV VACCINES  Aged Out   Pneumonia Vaccine 56+ Years old  Discontinued    Health Maintenance  Health Maintenance Due  Topic Date Due   Hepatitis C Screening  Never done   TETANUS/TDAP  Never done  Zoster Vaccines- Shingrix (1 of 2) Never done   DEXA SCAN  Never done   Diabetic kidney evaluation - Urine ACR  10/30/2015   COVID-19 Vaccine (6 - Pfizer risk series) 06/14/2021   OPHTHALMOLOGY EXAM  11/30/2021   INFLUENZA VACCINE  01/11/2022    Colorectal cancer screening: No longer required.   Mammogram status: Completed 10/20/2021. Repeat every year  Bone Density status: never done  Lung Cancer Screening: (Low Dose CT Chest recommended if Age 69-80 years, 30 pack-year currently smoking OR have quit w/in 15years.) does not qualify.   Lung Cancer Screening Referral: no  Additional Screening:  Hepatitis C Screening: does not qualify; Completed no  Vision Screening: Recommended annual ophthalmology exams for early detection of glaucoma and other disorders of the eye. Is the patient up to date with their annual eye exam?  Yes  Who is the provider or what is the name of the office in which the patient attends annual eye exams? Rolanda Jay, MD. If pt is not established with a provider, would they like to be referred to a provider to establish care? No .   Dental Screening: Recommended annual dental exams for proper oral  hygiene  Community Resource Referral / Chronic Care Management: CRR required this visit?  No   CCM required this visit?  No      Plan:     I have personally reviewed and noted the following in the patient's chart:   Medical and social history Use of alcohol, tobacco or illicit drugs  Current medications and supplements including opioid prescriptions. Patient is not currently taking opioid prescriptions. Functional ability and status Nutritional status Physical activity Advanced directives List of other physicians Hospitalizations, surgeries, and ER visits in previous 12 months Vitals Screenings to include cognitive, depression, and falls Referrals and appointments  In addition, I have reviewed and discussed with patient certain preventive protocols, quality metrics, and best practice recommendations. A written personalized care plan for preventive services as well as general preventive health recommendations were provided to patient.     Sheral Flow, LPN   9/79/[id number]   Nurse Notes:  There were no vitals filed for this visit. There is no height or weight on file to calculate BMI. Patient stated that she has no issues with gait or balance; does not use any assistive devices. Hearing Screening - Comments:: Denies hearing difficulties   Vision Screening - Comments:: Wears rx glasses - up to date with routine eye exams with Rolanda Jay, MD, Jovita Kussmaul, MD and Nada Libman, MD.

## 2022-03-21 ENCOUNTER — Telehealth: Payer: Self-pay

## 2022-03-21 DIAGNOSIS — E1169 Type 2 diabetes mellitus with other specified complication: Secondary | ICD-10-CM

## 2022-03-21 NOTE — Telephone Encounter (Signed)
Nurse Assessment Nurse: Anabel Bene, RN, Anderson [place] Date/Time Eilene Ghazi Time): 03/20/2022 10:04:14 AM Confirm and document reason for call. If symptomatic, describe symptoms. ---Caller states the last couple days her Blood sugar has been high since Friday or Saturday. It was 385 and now it is 405 and she feels hot. She has been trying to get it down but nothing has worked. When I asked what she has been doing to bring it down she said she has been drinking OJ and eating cereal. Recheck is 396. Does the patient have any new or worsening symptoms? ---Yes Will a triage be completed? ---Yes Related visit to physician within the last 2 weeks? ---No Does the PT have any chronic conditions? (i.e. diabetes, asthma, this includes High risk factors for pregnancy, etc.) ---Yes List chronic conditions. ---DM 7.5 last A1c, on Metformin. Is this a behavioral health or substance abuse call? ---No Guidelines Guideline Title Affirmed Question Affirmed Notes Nurse Date/Time (Eastern Time) Diabetes - High Blood Sugar [1] Blood glucose > 300 mg/dL (16.7 mmol/L) [2] two or more times in a row  Disp. Time Eilene Ghazi Time) Disposition Final User 03/20/2022 10:13:16 AM Call PCP Now Yes Anabel Bene, RN, Anderson Malta 03/20/2022 10:17:11 AM Called On-Call Provider Anabel Bene, RN, Anderson Malta Final Disposition 03/20/2022 10:13:16 AM Call PCP Now Yes Anabel Bene, RN, Nell [place] Disagree/Comply Comply Caller Understands Yes PreDisposition Did not know what to do Care Advice Given Per Guideline CALL PCP NOW: * I'll page the on-call provider now. If you haven't heard from the provider (or me) within 30 minutes, call again. CARE ADVICE given per Diabetes - High Blood Sugar (Adult) guideline

## 2022-03-21 NOTE — Telephone Encounter (Signed)
Please advise 

## 2022-03-22 NOTE — Telephone Encounter (Signed)
We can increase metformin to 2 pills twice a day to help with sugars. Avoid fruit juices and sugar containing beverages. Drink mostly water as liquid to help. Would she like to meet with nutritionist to help with understanding of sugars?

## 2022-03-22 NOTE — Telephone Encounter (Signed)
Nutritionist referral placed. 2000 mg daily is maximum dose for metformin and is used commonly but if she prefers not to increase dose okay to monitor.

## 2022-03-22 NOTE — Addendum Note (Signed)
Addended by: Pricilla Holm A on: 03/22/2022 02:48 PM   Modules accepted: Orders

## 2022-03-22 NOTE — Telephone Encounter (Signed)
Spoke to pt--declined to increase metformin to 2 pills BID daily-read info from Internet  dangerous to take more than 2 tab. Pt agreed to see nutritionist.

## 2022-03-23 ENCOUNTER — Ambulatory Visit (HOSPITAL_COMMUNITY)
Admission: RE | Admit: 2022-03-23 | Discharge: 2022-03-23 | Disposition: A | Discharge status: Home / Self Care | Payer: PPO | Source: Ambulatory Visit | Attending: Hematology and Oncology | Admitting: Hematology and Oncology

## 2022-03-23 ENCOUNTER — Encounter (HOSPITAL_COMMUNITY): Payer: Self-pay

## 2022-03-23 DIAGNOSIS — C50411 Malignant neoplasm of upper-outer quadrant of right female breast: Secondary | ICD-10-CM | POA: Insufficient documentation

## 2022-03-23 DIAGNOSIS — Z170 Estrogen receptor positive status [ER+]: Secondary | ICD-10-CM | POA: Diagnosis not present

## 2022-03-23 DIAGNOSIS — I700 Atherosclerosis of aorta: Secondary | ICD-10-CM | POA: Diagnosis not present

## 2022-03-23 DIAGNOSIS — E041 Nontoxic single thyroid nodule: Secondary | ICD-10-CM | POA: Insufficient documentation

## 2022-03-23 DIAGNOSIS — E1169 Type 2 diabetes mellitus with other specified complication: Secondary | ICD-10-CM | POA: Insufficient documentation

## 2022-03-23 DIAGNOSIS — K429 Umbilical hernia without obstruction or gangrene: Secondary | ICD-10-CM | POA: Diagnosis not present

## 2022-03-23 DIAGNOSIS — K5730 Diverticulosis of large intestine without perforation or abscess without bleeding: Secondary | ICD-10-CM | POA: Diagnosis not present

## 2022-03-23 DIAGNOSIS — J929 Pleural plaque without asbestos: Secondary | ICD-10-CM | POA: Diagnosis not present

## 2022-03-23 LAB — POCT I-STAT CREATININE: Creatinine, Ser: 0.8 mg/dL (ref 0.44–1.00)

## 2022-03-23 MED ORDER — IOHEXOL 300 MG/ML  SOLN
100.0000 mL | Freq: Once | INTRAMUSCULAR | Status: AC | PRN
  Administered 2022-03-23: 100 mL via INTRAVENOUS

## 2022-03-23 MED ORDER — SODIUM CHLORIDE (PF) 0.9 % IJ SOLN
INTRAMUSCULAR | Status: AC
  Filled 2022-03-23: qty 50

## 2022-03-30 ENCOUNTER — Other Ambulatory Visit: Payer: Self-pay | Admitting: *Deleted

## 2022-03-30 ENCOUNTER — Inpatient Hospital Stay: Payer: PPO | Attending: Hematology and Oncology

## 2022-03-30 ENCOUNTER — Inpatient Hospital Stay: Payer: PPO | Admitting: Hematology and Oncology

## 2022-03-30 ENCOUNTER — Encounter: Payer: Self-pay | Admitting: Hematology and Oncology

## 2022-03-30 ENCOUNTER — Other Ambulatory Visit (HOSPITAL_COMMUNITY): Payer: Self-pay

## 2022-03-30 ENCOUNTER — Other Ambulatory Visit: Payer: Self-pay

## 2022-03-30 VITALS — BP 166/94 | HR 88 | Temp 97.9°F | Resp 16 | Ht 65.0 in | Wt 225.1 lb

## 2022-03-30 DIAGNOSIS — Z170 Estrogen receptor positive status [ER+]: Secondary | ICD-10-CM

## 2022-03-30 DIAGNOSIS — Z90710 Acquired absence of both cervix and uterus: Secondary | ICD-10-CM | POA: Insufficient documentation

## 2022-03-30 DIAGNOSIS — C50411 Malignant neoplasm of upper-outer quadrant of right female breast: Secondary | ICD-10-CM | POA: Diagnosis not present

## 2022-03-30 DIAGNOSIS — Z801 Family history of malignant neoplasm of trachea, bronchus and lung: Secondary | ICD-10-CM | POA: Diagnosis not present

## 2022-03-30 DIAGNOSIS — E278 Other specified disorders of adrenal gland: Secondary | ICD-10-CM | POA: Diagnosis not present

## 2022-03-30 DIAGNOSIS — E042 Nontoxic multinodular goiter: Secondary | ICD-10-CM | POA: Diagnosis not present

## 2022-03-30 DIAGNOSIS — Z803 Family history of malignant neoplasm of breast: Secondary | ICD-10-CM | POA: Diagnosis not present

## 2022-03-30 DIAGNOSIS — Z79811 Long term (current) use of aromatase inhibitors: Secondary | ICD-10-CM | POA: Insufficient documentation

## 2022-03-30 DIAGNOSIS — Z853 Personal history of malignant neoplasm of breast: Secondary | ICD-10-CM | POA: Insufficient documentation

## 2022-03-30 DIAGNOSIS — Z85118 Personal history of other malignant neoplasm of bronchus and lung: Secondary | ICD-10-CM | POA: Insufficient documentation

## 2022-03-30 DIAGNOSIS — Z8542 Personal history of malignant neoplasm of other parts of uterus: Secondary | ICD-10-CM | POA: Insufficient documentation

## 2022-03-30 DIAGNOSIS — I10 Essential (primary) hypertension: Secondary | ICD-10-CM | POA: Insufficient documentation

## 2022-03-30 DIAGNOSIS — R1310 Dysphagia, unspecified: Secondary | ICD-10-CM | POA: Diagnosis not present

## 2022-03-30 DIAGNOSIS — J984 Other disorders of lung: Secondary | ICD-10-CM | POA: Diagnosis not present

## 2022-03-30 DIAGNOSIS — Z923 Personal history of irradiation: Secondary | ICD-10-CM | POA: Diagnosis not present

## 2022-03-30 LAB — CBC WITH DIFFERENTIAL (CANCER CENTER ONLY)
Abs Immature Granulocytes: 0.02 10*3/uL (ref 0.00–0.07)
Basophils Absolute: 0 10*3/uL (ref 0.0–0.1)
Basophils Relative: 1 %
Eosinophils Absolute: 0 10*3/uL (ref 0.0–0.5)
Eosinophils Relative: 1 %
HCT: 39.5 % (ref 36.0–46.0)
Hemoglobin: 13.7 g/dL (ref 12.0–15.0)
Immature Granulocytes: 1 %
Lymphocytes Relative: 30 %
Lymphs Abs: 0.8 10*3/uL (ref 0.7–4.0)
MCH: 32.6 pg (ref 26.0–34.0)
MCHC: 34.7 g/dL (ref 30.0–36.0)
MCV: 94 fL (ref 80.0–100.0)
Monocytes Absolute: 0.2 10*3/uL (ref 0.1–1.0)
Monocytes Relative: 8 %
Neutro Abs: 1.6 10*3/uL — ABNORMAL LOW (ref 1.7–7.7)
Neutrophils Relative %: 59 %
Platelet Count: 197 10*3/uL (ref 150–400)
RBC: 4.2 MIL/uL (ref 3.87–5.11)
RDW: 14.4 % (ref 11.5–15.5)
WBC Count: 2.6 10*3/uL — ABNORMAL LOW (ref 4.0–10.5)
nRBC: 0 % (ref 0.0–0.2)

## 2022-03-30 LAB — CMP (CANCER CENTER ONLY)
ALT: 13 U/L (ref 0–44)
AST: 11 U/L — ABNORMAL LOW (ref 15–41)
Albumin: 4.2 g/dL (ref 3.5–5.0)
Alkaline Phosphatase: 81 U/L (ref 38–126)
Anion gap: 7 (ref 5–15)
BUN: 19 mg/dL (ref 8–23)
CO2: 26 mmol/L (ref 22–32)
Calcium: 9.3 mg/dL (ref 8.9–10.3)
Chloride: 104 mmol/L (ref 98–111)
Creatinine: 0.82 mg/dL (ref 0.44–1.00)
GFR, Estimated: >60 mL/min (ref >60)
Glucose, Bld: 241 mg/dL — ABNORMAL HIGH (ref 70–99)
Potassium: 4.4 mmol/L (ref 3.5–5.1)
Sodium: 137 mmol/L (ref 135–145)
Total Bilirubin: 0.5 mg/dL (ref 0.3–1.2)
Total Protein: 7.1 g/dL (ref 6.5–8.1)

## 2022-03-30 NOTE — Progress Notes (Signed)
[facility]  [phone] [phone]     ID: [name] DOB: 09-06-1945  MR#: [id number]  ZTI#:[id number]  Patient Care Team: Hoyt Koch, MD as PCP - General (Internal Medicine) Alphonsa Overall, MD as Consulting Physician (General Surgery) Magrinat, Virgie Dad, MD (Inactive) as Consulting Physician (Oncology) Kyung Rudd, MD as Consulting Physician (Radiation Oncology) Bobbye Charleston, MD as Consulting Physician (Obstetrics and Gynecology) Nada Libman, MD as Referring Physician (Specialist) Collene Gobble, MD as Consulting Physician (Pulmonary Disease) Alexis Frock, MD as Consulting Physician (Urology) Ander Slade, Carlisle Beers, MD as Referring Physician (Ophthalmology) Raina Mina, RPH-CPP (Pharmacist) Corey Harold, MD as Consulting Physician (Ophthalmology) OTHER MD:  CHIEF COMPLAINT: Estrogen receptor positive breast cancer  CURRENT TREATMENT: Letrozole, palbociclib   INTERVAL HISTORY:  [name] returns today for follow-up of her estrogen receptor positive stage IV breast cancer.  She overall has been tolerating Ibrance and letrozole very well.  No adverse effects. She has been having trouble swallowing to both liquids and solids Since last visit, she had an ultrasound of her thyroid gland and CT imaging.  She is very thrilled that she has been on this combination of medications for almost 6 years and she has no evidence of cancer.  She is working on her weight, has lost 27 pounds in the past 10 months and is very excited to downsize her clothes.  She notices some difficulty swallowing but once again tells me that this is not very bothersome to investigate.  She notices this both to solids and liquids, this morning when she was trying to swallow her pills, she almost near choked.  She also notices a random pain in the right armpit/axilla with certain movements.  She however refuses to investigate the difficulty swallowing today.  She  will let us know if it continues to bother her. Rest of the pertinent 10 point ROS reviewed and negative.  We are continuing to follow her tumor marker: Lab Results  Component Value Date   CA2729 34.1 01/10/2022   CA2729 35.5 09/28/2021   CA2729 37.1 06/29/2021   CA2729 43.1 (H) 04/28/2021   CA2729 37.2 01/26/2021    REVIEW OF SYSTEMS:   A detailed review of systems today was otherwise stable.   COVID 19 VACCINATION STATUS: Status post Pfizer x2 followed by booster August 2021; had COVID November 2022   BREAST CANCER HISTORY: From the original intake note:  [name] had screening mammography showing some suspicious calcifications in the right breast leading to right diagnostic mammography with ultrasonography 01/22/2016 at [place]. The breast density was category C. In the upper right breast there was a 2.3 cm mass with additional masses measuring 0.9 and 0.7 cm. There was also a possible additional 0.8 mass in the lower inner quadrant. Ultrasound confirmed an irregular hypoechoic mass in the right breast upper outer quadrant measuring 2.0 cm. There were other masses measuring 0.7 and 0.8 cm by ultrasonography. The right axilla was sonographically benign.  Biopsy of a 12:00 and 4:00 mass in the right breast 01/22/2016 showed ([id number]) both specimens showing invasive ductal carcinoma, grade 1 or 2, both 95% estrogen receptor positive, both 95% progesterone receptor positive, both with strong staining intensity, with MIB-1 ranging from 10-15%, and both HER-2 negative, the signals ratio being 1.23-1.42, and the number per cell 1.85-2.59.  Her subsequent history is as detailed below   PAST MEDICAL HISTORY: Past Medical History:  Diagnosis Date   Breast cancer ([place])    Cancer ([place])  02/2016   right breast   DIABETES MELLITUS, TYPE II 01/04/2007   only takes actoplus daily   Dizziness and giddiness 02/29/2008   DVT, HX OF    at age 66 in right buttocks   Dyspnea    due to lung cancer    Family history of breast cancer    GERD 01/04/2007   pt reports resolved    GLAUCOMA 07/30/2008   both eyes   History of blood transfusion    no abnormal  reaction   History of uterine cancer 2000   hysterectomy done   HYPERLIPIDEMIA 01/04/2007   taking Pravastatin daily   HYPERTENSION 01/04/2007   takes Lisinopril daily   Joint pain    Joint swelling    Leg cramps    LEG PAIN, LEFT 07/06/2007   NUMBNESS 07/30/2008   in fingers;pt states from Diam[facility]   OSTEOARTHRITIS, HIP 09/25/2009   OTITIS MEDIA, ACUTE, BILATERAL 02/29/2008   Overweight(278.02) 01/04/2007   Peripheral vascular disease ([place])    Personal history of radiation therapy 2018   Pneumonia    PONV (postoperative nausea and vomiting)    SLEEP APNEA, OBSTRUCTIVE    doesn't use a cpap;study done about 30yrs ago   [place], HX OF 01/04/2007   Vision loss    left eye    PAST SURGICAL HISTORY: Past Surgical History:  Procedure Laterality Date   ABDOMINAL HYSTERECTOMY  2000   BREAST LUMPECTOMY Right 01/13/2017   x2   BREAST LUMPECTOMY WITH RADIOACTIVE SEED AND SENTINEL LYMPH NODE BIOPSY Right 01/13/2017   Procedure: RIGHT BREAST RADIOACTIVE SEED X'S 2 GUIDED LUMPECTOMY WITH RADIOACTIVE SEED TARGETED AXILLARYLYMPH NODE EXCISION AND RIGHT AXILLARY SENTINEL LYMPH NODE BIOPSY;  Surgeon: Alphonsa Overall, MD;  Location: [facility];  Service: General;  Laterality: Right;  2 SEEDS IN RIGHT BREAST 1 SEED IN RIGHT AXILLARY NODE   CHOLECYSTECTOMY     ENDOBRONCHIAL ULTRASOUND Bilateral 03/28/2016   Procedure: ENDOBRONCHIAL ULTRASOUND;  Surgeon: Collene Gobble, MD;  Location: [facility];  Service: Cardiopulmonary;  Laterality: Bilateral;   EYE SURGERY  13   shunt left and lazer eye surgery on right cataract and retenia tear with repair   growth removal  2004   from thumb   KNEE ARTHROSCOPY Right    mulitple eye surgeries     both eyes, cataracts with ioc done both eyes   OOPHORECTOMY     right lumpectomy with axillary  node dissection Right 01/2017   TOTAL HIP ARTHROPLASTY  06/24/2011   Procedure: TOTAL HIP ARTHROPLASTY;  Surgeon: Kerin Salen;  Location: [facility];  Service: Orthopedics;  Laterality: Right;   TOTAL HIP ARTHROPLASTY Left 11/12/2012   Dr Mayer Camel   TOTAL HIP ARTHROPLASTY Left 11/12/2012   Procedure: TOTAL HIP ARTHROPLASTY;  Surgeon: Kerin Salen, MD;  Location: [facility];  Service: Orthopedics;  Laterality: Left;  DEPUY PINNACLE    FAMILY HISTORY Family History  Problem Relation Age of Onset   Breast cancer Mother 54   Dementia Mother    Cancer Mother        Breast and lung cancer   Stroke Sister    Breast cancer Sister 72   Heart attack Maternal Aunt    Lung cancer Maternal Grandmother        non smoker   Glaucoma Maternal Grandfather    Anesthesia problems Neg Hx   The patient's father died at age 37, the patient's mother died at age 9. She had breast and lung cancers diagnosed  shortly before her death. The patient had no brothers, 2 sisters. One sister was diagnosed with breast cancer at the age of 39.   GYNECOLOGIC HISTORY:  No LMP recorded. Patient has had a hysterectomy. Menarche age 22, first live birth age 67, the patient is GX P1. She had a hysterectomy for endometrial cancer in the year 2000. She did not take hormone replacement. She did use oral contraceptives for more than 20 years remotely, with no complications.   SOCIAL HISTORY: (Updated July 2021). 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 She is home with [name]. He is a retired Dealer.Their son [name] also lives in [place].  The patient has 3 grandchildren aged 42, 41 and 20    ADVANCED DIRECTIVES: In place   HEALTH MAINTENANCE: Social History   Tobacco Use   Smoking status: Never   Smokeless tobacco: Never  Vaping Use   Vaping Use: Never used  Substance Use Topics   Alcohol use: No   Drug use: No     Colonoscopy: Never  PAP:  Status post hysterectomy  Bone density: Remote   Allergies  Allergen Reactions   Codeine Hives    Hycodan syrup   Fluorescein Nausea And Vomiting    ? IV dye for retina specialist   Lipitor [Atorvastatin Calcium]     Leg cramp   Oxycodone Nausea And Vomiting    Patient vomited for 3 days after taking   Sitagliptin Phosphate Nausea And Vomiting   Sulfa Drugs Cross Reactors Nausea And Vomiting    Current Outpatient Medications  Medication Sig Dispense Refill   acetaminophen (TYLENOL) 500 MG tablet Take 1,000 mg by mouth every 4 (four) hours as needed for moderate pain or fever.     aspirin EC 81 MG tablet Take 81 mg by mouth daily at 6 PM. 1700     Biotin 1 MG CAPS Take by mouth.     brimonidine (ALPHAGAN) 0.2 % ophthalmic solution INSTILL 1 DROP INTO EACH EYE THREE TIMES DAILY     cholecalciferol (VITAMIN D) 1000 units tablet Take 1,000 Units by mouth daily.     dorzolamide-timolol (COSOPT) 22.3-6.8 MG/ML ophthalmic solution Place 1 drop into both eyes 2 (two) times daily.     lisinopril (ZESTRIL) 5 MG tablet Take 5 mg by mouth daily.     lovastatin (MEVACOR) 20 MG tablet 1 tab by mouth every other bedtime 45 tablet 3   metFORMIN (GLUCOPHAGE-XR) 500 MG 24 hr tablet Take 1-2 tablets (500-1,000 mg total) by mouth daily with breakfast. 180 tablet 1   Netarsudil-Latanoprost (ROCKLATAN) 0.02-0.005 % SOLN Apply to eye.     palbociclib (IBRANCE) 75 MG tablet Take 1 tablet (75 mg total) by mouth daily. Take for 21 days on, 7 days off, repeat every 28 days. 21 tablet 4   pioglitazone (ACTOS) 15 MG tablet Take 1 tablet (15 mg total) by mouth daily. 90 tablet 3   prednisoLONE acetate (PRED FORTE) 1 % ophthalmic suspension SMARTSIG:1 In Eye(s) 6 Times Daily     No current facility-administered medications for this visit.     OBJECTIVE: white woman in no acute distress  Vitals:   03/30/22 1330  BP: (!) 166/94  Pulse: 88  Resp: 16  Temp: 97.9 F (36.6 C)  SpO2: 98%     Wt Readings  from Last 3 Encounters:  03/30/22 225 lb 1.6 oz (102.1 kg)  01/25/22 228 lb (103.4  kg)  01/10/22 231 lb 11.2 oz (105.1 kg)   Body mass index is 37.46 kg/m.    ECOG FS:1 - Symptomatic but completely ambulatory  Physical Exam Constitutional:      Appearance: Normal appearance.  Cardiovascular:     Rate and Rhythm: Normal rate and regular rhythm.     Pulses: Normal pulses.     Heart sounds: Normal heart sounds.  Pulmonary:     Effort: Pulmonary effort is normal.     Breath sounds: Normal breath sounds.  Musculoskeletal:        General: Swelling (Baseline lower extremity swelling with some varicose veins) present. Normal range of motion.     Cervical back: Normal range of motion and neck supple. No rigidity.  Lymphadenopathy:     Cervical: No cervical adenopathy.  Skin:    General: Skin is warm and dry.  Neurological:     General: No focal deficit present.     Mental Status: She is alert.  Psychiatric:        Mood and Affect: Mood normal.      LAB RESULTS:  CMP     Component Value Date/Time   NA 138 01/10/2022 1223   NA 140 05/29/2017 1254   K 4.3 01/10/2022 1223   K 4.4 05/29/2017 1254   CL 107 01/10/2022 1223   CO2 26 01/10/2022 1223   CO2 25 05/29/2017 1254   GLUCOSE 205 (H) 01/10/2022 1223   GLUCOSE 154 (H) 05/29/2017 1254   BUN 17 01/10/2022 1223   BUN 11.5 05/29/2017 1254   CREATININE 0.80 03/23/2022 1019   CREATININE 0.69 01/10/2022 1223   CREATININE 0.8 05/29/2017 1254   CALCIUM 9.0 01/10/2022 1223   CALCIUM 9.3 05/29/2017 1254   PROT 6.8 01/10/2022 1223   PROT 7.0 05/29/2017 1254   ALBUMIN 4.1 01/10/2022 1223   ALBUMIN 4.1 05/29/2017 1254   AST 12 (L) 01/10/2022 1223   AST 13 05/29/2017 1254   ALT 10 01/10/2022 1223   ALT 14 05/29/2017 1254   ALKPHOS 63 01/10/2022 1223   ALKPHOS 66 05/29/2017 1254   BILITOT 0.5 01/10/2022 1223   BILITOT 0.50 05/29/2017 1254   GFRNONAA >60 01/10/2022 1223   GFRAA >60 03/10/2020 1138    INo results found  for: "SPEP", "UPEP"  Lab Results  Component Value Date   WBC 2.6 (L) 03/30/2022   NEUTROABS 1.6 (L) 03/30/2022   HGB 13.7 03/30/2022   HCT 39.5 03/30/2022   MCV 94.0 03/30/2022   PLT 197 03/30/2022      Chemistry      Component Value Date/Time   NA 138 01/10/2022 1223   NA 140 05/29/2017 1254   K 4.3 01/10/2022 1223   K 4.4 05/29/2017 1254   CL 107 01/10/2022 1223   CO2 26 01/10/2022 1223   CO2 25 05/29/2017 1254   BUN 17 01/10/2022 1223   BUN 11.5 05/29/2017 1254   CREATININE 0.80 03/23/2022 1019   CREATININE 0.69 01/10/2022 1223   CREATININE 0.8 05/29/2017 1254      Component Value Date/Time   CALCIUM 9.0 01/10/2022 1223   CALCIUM 9.3 05/29/2017 1254   ALKPHOS 63 01/10/2022 1223   ALKPHOS 66 05/29/2017 1254   AST 12 (L) 01/10/2022 1223   AST 13 05/29/2017 1254   ALT 10 01/10/2022 1223   ALT 14 05/29/2017 1254   BILITOT 0.5 01/10/2022 1223   BILITOT 0.50 05/29/2017 1254       No results found for: "LABCA2"  No components  found for: "LABCA125"  No results for input(s): "INR" in the last 168 hours.  Urinalysis    Component Value Date/Time   COLORURINE YELLOW 08/18/2017 1322   APPEARANCEUR HAZY (A) 08/18/2017 1322   LABSPEC 1.010 08/18/2017 1322   PHURINE 7.0 08/18/2017 1322   GLUCOSEU NEGATIVE 08/18/2017 1322   GLUCOSEU NEGATIVE 10/30/2014 1513   HGBUR LARGE (A) 08/18/2017 1322   BILIRUBINUR NEGATIVE 08/18/2017 1322   KETONESUR NEGATIVE 08/18/2017 1322   PROTEINUR 30 (A) 08/18/2017 1322   UROBILINOGEN 0.2 10/30/2014 1513   NITRITE NEGATIVE 08/18/2017 1322   LEUKOCYTESUR LARGE (A) 08/18/2017 1322    STUDIES: CT CHEST ABDOMEN PELVIS W CONTRAST  Result Date: 03/24/2022 CLINICAL DATA:  Metastatic breast cancer. Upper outer quadrant of the right breast, estrogen receptor positive. History of thyroid nodule evaluated by ultrasound 05/11/2021 and 03/23/2022. * Tracking Code: BO * EXAM: CT CHEST, ABDOMEN, AND PELVIS WITH CONTRAST TECHNIQUE: Multidetector  CT imaging of the chest, abdomen and pelvis was performed following the standard protocol during bolus administration of intravenous contrast. RADIATION DOSE REDUCTION: This exam was performed according to the departmental dose-optimization program which includes automated exposure control, adjustment of the mA and/or kV according to patient size and/or use of iterative reconstruction technique. CONTRAST:  170mL OMNIPAQUE IOHEXOL 300 MG/ML  SOLN COMPARISON:  Prior CTs 09/20/2021 and 04/28/2021. Abdominal MRI 01/13/2018. FINDINGS: CT CHEST FINDINGS Cardiovascular: No acute vascular findings. Moderate coronary artery and mild aortic atherosclerosis. The heart size is normal. There is no pericardial effusion. Mediastinum/Nodes: There are no enlarged mediastinal, hilar, axillary or internal mammary lymph nodes. An approximately 2.1 cm left thyroid nodule is unchanged (image 9/2), evaluated by follow-up ultrasound today. The esophagus and trachea appear unremarkable. Lungs/Pleura: No pleural effusion or pneumothorax. Chronic right upper lobe scarring attributed to previous radiation therapy. No suspicious pulmonary nodule or confluent airspace opacity. Scattered mild central airway thickening. Musculoskeletal/Chest wall: Stable postsurgical changes in the right breast with chronic skin retraction. No recurrent chest wall mass or suspicious osseous finding. CT ABDOMEN AND PELVIS FINDINGS Hepatobiliary: The liver is normal in density without suspicious focal abnormality. Unchanged chronic moderate to severe biliary dilatation. The common hepatic duct measures up to 2.1 cm in diameter. The common bile duct tapers distally. No evidence of choledocholithiasis. Previous cholecystectomy. Pancreas: Unremarkable. No pancreatic ductal dilatation or surrounding inflammatory changes. Spleen: Normal in size without focal abnormality. Adrenals/Urinary Tract: Both adrenal glands appear stable without suspicious findings. No evidence of  urinary tract calculus, suspicious renal lesion or hydronephrosis. There is a stable intermediate lesion in the upper pole the right kidney which measures 3 cm in diameter. Although higher in density than water (41 HU), this was shown to reflect a cyst on previous MRI. No follow-up imaging recommended. The bladder appears unremarkable for its degree of distention, although is largely obscured by artifact from the bilateral total hip arthroplasties. Stomach/Bowel: Enteric contrast was administered and has passed into the distal colon. The stomach appears unremarkable for its degree of distension. No evidence of bowel wall thickening, distention or surrounding inflammatory change. The appendix appears normal. Mild distal colonic diverticulosis. Vascular/Lymphatic: There are no enlarged abdominal or pelvic lymph nodes. Moderate aortic and branch vessel atherosclerosis without evidence of aneurysm or large vessel occlusion. Reproductive: Hysterectomy. No adnexal mass. Probable pelvic floor laxity. Other: Unchanged periumbilical hernias (above and below the umbilicus), containing only fat. No ascites or peritoneal nodularity. Musculoskeletal: No acute or significant osseous findings. Mild lumbar spondylosis. Bilateral total hip arthroplasty. IMPRESSION: 1. Stable CTS  of the chest, abdomen and pelvis. No evidence of local recurrence or metastatic disease. 2. Stable chronic biliary dilatation status post cholecystectomy, likely physiologic. 3. Stable periumbilical hernias containing only fat. 4.  Aortic Atherosclerosis (ICD10-I70.0). Electronically Signed   By: Richardean Sale M.D.   On: 03/24/2022 16:11   US THYROID  Result Date: 03/24/2022 CLINICAL DATA:  Prior ultrasound follow-up. EXAM: THYROID ULTRASOUND TECHNIQUE: Ultrasound examination of the thyroid gland and adjacent soft tissues was performed. COMPARISON:  None Available. FINDINGS: Parenchymal Echotexture: Mildly heterogenous Isthmus: 0.3 cm Right lobe: 4.2  x 1.6 x 1.2 cm Left lobe: 4.3 x 2.2 x 2.2 cm _________________________________________________________ Estimated total number of nodules >/= 1 cm: 3 Number of spongiform nodules >/=  2 cm not described below (TR1): 0 Number of mixed cystic and solid nodules >/= 1.5 cm not described below (TR2): 0 _________________________________________________________ Nodule labeled 1 is a small subcentimeter solid isoechoic nodule with a focus of macrocalcification (TR 4) measuring 0.8 cm in size, not significantly changed. Given size (<0.9 cm) and appearance, this nodule does NOT meet TI-RADS criteria for biopsy or dedicated follow-up. Nodule labeled 2 is a previously described solid hypoechoic TR 4 nodule in the mid right thyroid lobe measuring 1.1 x 1.0 x 0.8 cm, previously 1.2 cm. It does not appear significantly changed in size or morphology. *Given size (>/= 1 - 1.4 cm) and appearance, a follow-up ultrasound in 1 year should be considered based on TI-RADS criteria. Nodule labeled 3 appears to be a solid hypoechoic TR 4 nodule in the posterior aspect of the mid right thyroid lobe, not definitively seen on previous exam, measuring 1.1 x 1.0 x 0.7 cm. *Given size (>/= 1 - 1.4 cm) and appearance, a follow-up ultrasound in 1 year should be considered based on TI-RADS criteria. Nodule labeled 4 (previously 3) is a large spongiform appearing nodule in the mid left thyroid lobe measuring up to 3.4 cm. This nodule does NOT meet TI-RADS criteria for biopsy or dedicated follow-up. IMPRESSION: 1. Multinodular thyroid gland. 2. Nodule labeled 2 in the right thyroid lobe (1.1 cm TR 4) continues to meet criteria for follow-up ultrasound in 1 year. This exam marks 1 year stability. Total follow-up interval of 5 years is recommended. 3. Nodule labeled 3 in the right thyroid lobe (1.1 cm TR 4) was not definitively seen on previous exam and meets criteria for follow-up ultrasound in 1 year. A total follow-up interval of 5 years is  recommended, with this exam serving as a baseline. The above is in keeping with the ACR TI-RADS recommendations - J Am Coll Radiol 2017;14:587-595. Electronically Signed   By: Albin Felling M.D.   On: 03/24/2022 09:42     ELIGIBLE FOR AVAILABLE RESEARCH PROTOCOL: no  ASSESSMENT: 76 y.o. [place] woman with a remote history of early stage endometrial cancer, subsequently status post right breast upper outer quadrant biopsy 01/22/2016 for a clinically multifocal T2 N0, stage 2A invasive ductal carcinoma, grade 1, estrogen and progesterone receptor positive, HER-2 negative, with an MIB-1 between 10 and 15%.  (1) right axillary lymph node biopsy 03/02/2016 positive  (2) genetics testing 01/13/2016 through the Custom gene panel offered by [facility] found no deleterious mutations in  ATM, BARD1, BRCA1, BRCA2, BRIP1, CDH1, CHEK2, EPCAM, FANCC, MLH1, MSH2, MSH6, MUTYH, NBN, PALB2, PMS2, POLD1, PTEN, RAD51C, RAD51D, TP53, and XRCC2  METASTATIC DISEASE: OCT 2017 (3) CT scans of the chest abdomen and pelvis obtained 03/10/2016 are consistent with bilateral lung metastases and mediastinal and hilar  nodal involvement, but no liver or bone spread  (a) bronchoscopic lymph node biopsy 2 (station 7, 13R) 03/28/2016 confirms metastatic adenocarcinoma, estrogen receptor positive, HER-2 not amplified  (b) baseline CA-27-29 on 04/18/2016 was 137.5.  (4) letrozole started 03/15/2016, palbociclib added 03/29/2016 at 125 mg/day, 21/7  (a) dose decreased to 100 mg per day, 21/7, beginning with February cycle  (b) palbociclib held 01/31/2017, with increasing symptoms  (c) palbociclib resumed October 2018 at 75 mg daily  (d) palbociclib dose reduced to 75 mg every other day February through April 2019  (e) palbociclib dose resumed at 75 mg daily as of 10/17/2017  (f) palbociclib dose decreased to 75 mg every other day beginning 07/31/2019  (g) palbociclib resumed at 75 mg daily, 21 days on 7 off, as of  08/25/2019  (5) status post double right lumpectomies and right axillary lymph node sampling 01/13/2017 for 2 separate invasive ductal carcinoma lesions, pT1a and pT1b, N1a, with negative margins, both lesions being estrogen and progesterone receptor positive and HER-2 negative  (6) adjuvant radiation completed 07/05/2017 1. 50.4 Gy in 28 fractions to the right breast and supraclavicular region using whole-breast tangent fields. 2. Boost to the seroma delivered an additional 10 Gy in 5 fractions. The total dose was 60.4 Gy.  (7) restaging studies:  (a) CT scan of the chest and bone scan 06/23/2017 showed stable scattered very small lung nodules, no bone lesions  (b) CT of the chest 10/17/2017 showed no new or progressive metastatic disease in the chest. The small left lower lobe pulmonary nodule is stable  (c) PET scan on 03/02/2018: shows no findings for residual or recurrent right breast cancer  (d) chest CT scan stable, questionable right renal cyst noted  (e) chest CT 09/18/2020 shows no measurable disease  (f) to the CT scan 04/29/2021 shows no evidence of active disease; a 2 cm thyroid nodule was noted   (8) right upper pole renal lesion noted to be enlarging on CT scan 06/22/2017  (a) no uptake on PET scan obtained 03/02/2018  #9 Most recent imaging with no new suspicious mass or lymphadenopathy identified in the chest abdomen or pelvis.  Stable chronic pleural and parenchymal scarring densities in the right lung apex.  Stable chronic moderate to severe biliary ductal dilatation.  Stable chronic 11 mm left adrenal gland nodule.  No dedicated follow-up required   PLAN:  She continues on Ibrance 75 mg once daily 3 weeks on 1 week off and letrozole daily.   She has been doing imaging every 6 months and following up with Dr. Jana Hakim every 3 months.   We have reviewed her most recent imaging which shows no new evidence of metastatic disease. Ultrasound thyroid with thyroid nodules which  warrant follow-up in 1 year, repeat imaging ordered. I applauded her regarding her weight loss, encouraged healthy diet.  With regards to her difficulty swallowing, we have discussed about considering an endoscopy which she refused at this time.  She tells me that she will keep [place] posted if this changes or gets worse. She will continue follow-up every 3 months and consider imaging every 6 months to a year.  She does not want to do oral contrast in the future since she had terrible nausea and diarrhea after her last CT scan.  Total time spent: 30 min  Thank you for consulting [place] in the care of this patient.  Please not hesitate contact us with any additional questions or concerns.  Total time spent: 30 minutes *Total  Encounter Time as defined by the Centers for Medicare and Medicaid Services includes, in addition to the face-to-face time of a patient visit (documented in the note above) non-face-to-face time: obtaining and reviewing outside history, ordering and reviewing medications, tests or procedures, care coordination (communications with other health care professionals or caregivers) and documentation in the medical record.

## 2022-04-01 ENCOUNTER — Telehealth: Payer: Self-pay | Admitting: Pharmacy Technician

## 2022-04-01 LAB — CANCER ANTIGEN 27.29: CA 27.29: 31.6 U/mL (ref 0.0–38.6)

## 2022-04-01 NOTE — Telephone Encounter (Signed)
Oral Oncology Patient Advocate Encounter   Received notification that patient is due for re-enrollment for assistance for Ibrance through [employer].   Re-enrollment submitted via online portal.  Application is pending MD signatures.  [employer] [facility] phone number [phone].   I will continue to follow until final determination.  Lady Deutscher, CPhT-Adv Oncology Pharmacy Patient [place] Direct Number: [phone]  Fax: [phone]

## 2022-04-08 NOTE — Telephone Encounter (Signed)
Oral Oncology Patient Advocate Encounter   Submitted MD signatures to [employer] [facility] to complete Chief Operating Officer.  Signatures uploaded to Dealer number [phone].   I will continue to check the status until final determination.   Lady Deutscher, CPhT-Adv Oncology Pharmacy Patient [place] Direct Number: [phone]  Fax: [phone]

## 2022-04-12 ENCOUNTER — Ambulatory Visit: Payer: PPO | Admitting: Hematology and Oncology

## 2022-04-12 ENCOUNTER — Other Ambulatory Visit: Payer: PPO

## 2022-04-13 ENCOUNTER — Other Ambulatory Visit: Payer: Self-pay | Admitting: Internal Medicine

## 2022-04-13 NOTE — Telephone Encounter (Signed)
Please refill as per office routine med refill policy (all routine meds to be refilled for 3 mo or monthly (per pt preference) up to one year from last visit, then month to month grace period for 3 mo, then further med refills will have to be denied) ? ?

## 2022-04-18 DIAGNOSIS — H401134 Primary open-angle glaucoma, bilateral, indeterminate stage: Secondary | ICD-10-CM | POA: Diagnosis not present

## 2022-04-21 DIAGNOSIS — H20013 Primary iridocyclitis, bilateral: Secondary | ICD-10-CM | POA: Diagnosis not present

## 2022-04-22 ENCOUNTER — Ambulatory Visit: Payer: PPO | Admitting: Dietician

## 2022-04-22 ENCOUNTER — Other Ambulatory Visit: Payer: Self-pay | Admitting: *Deleted

## 2022-04-22 MED ORDER — LETROZOLE 2.5 MG PO TABS: 2.5000 mg | ORAL_TABLET | Freq: Every day | ORAL | 3 refills | Status: DC

## 2022-05-16 ENCOUNTER — Other Ambulatory Visit: Payer: Self-pay | Admitting: Internal Medicine

## 2022-05-19 DIAGNOSIS — H20013 Primary iridocyclitis, bilateral: Secondary | ICD-10-CM | POA: Diagnosis not present

## 2022-05-24 DIAGNOSIS — Z7982 Long term (current) use of aspirin: Secondary | ICD-10-CM | POA: Diagnosis not present

## 2022-05-24 DIAGNOSIS — H401124 Primary open-angle glaucoma, left eye, indeterminate stage: Secondary | ICD-10-CM | POA: Diagnosis not present

## 2022-05-24 DIAGNOSIS — Z885 Allergy status to narcotic agent status: Secondary | ICD-10-CM | POA: Diagnosis not present

## 2022-05-24 DIAGNOSIS — Z853 Personal history of malignant neoplasm of breast: Secondary | ICD-10-CM | POA: Diagnosis not present

## 2022-05-24 DIAGNOSIS — Z8673 Personal history of transient ischemic attack (TIA), and cerebral infarction without residual deficits: Secondary | ICD-10-CM | POA: Diagnosis not present

## 2022-05-24 DIAGNOSIS — Z888 Allergy status to other drugs, medicaments and biological substances status: Secondary | ICD-10-CM | POA: Diagnosis not present

## 2022-05-24 DIAGNOSIS — Z882 Allergy status to sulfonamides status: Secondary | ICD-10-CM | POA: Diagnosis not present

## 2022-05-24 DIAGNOSIS — T85698A Other mechanical complication of other specified internal prosthetic devices, implants and grafts, initial encounter: Secondary | ICD-10-CM | POA: Diagnosis not present

## 2022-05-24 DIAGNOSIS — Z86718 Personal history of other venous thrombosis and embolism: Secondary | ICD-10-CM | POA: Diagnosis not present

## 2022-05-24 DIAGNOSIS — H4010X4 Unspecified open-angle glaucoma, indeterminate stage: Secondary | ICD-10-CM | POA: Diagnosis not present

## 2022-05-24 DIAGNOSIS — Z6837 Body mass index (BMI) 37.0-37.9, adult: Secondary | ICD-10-CM | POA: Diagnosis not present

## 2022-05-24 DIAGNOSIS — E119 Type 2 diabetes mellitus without complications: Secondary | ICD-10-CM | POA: Diagnosis not present

## 2022-05-24 DIAGNOSIS — E669 Obesity, unspecified: Secondary | ICD-10-CM | POA: Diagnosis not present

## 2022-06-20 ENCOUNTER — Other Ambulatory Visit: Payer: Self-pay | Admitting: *Deleted

## 2022-06-20 DIAGNOSIS — Z170 Estrogen receptor positive status [ER+]: Secondary | ICD-10-CM

## 2022-06-20 NOTE — Progress Notes (Unsigned)
[name]  [phone] [phone]     ID: [name] DOB: 1946/01/29  MR#: [id number]  HEN#:[id number]  Patient Care Team: Hoyt Koch, MD as PCP - General (Internal Medicine) Alphonsa Overall, MD as Consulting Physician (General Surgery) Magrinat, Virgie Dad, MD (Inactive) as Consulting Physician (Oncology) Kyung Rudd, MD as Consulting Physician (Radiation Oncology) Bobbye Charleston, MD as Consulting Physician (Obstetrics and Gynecology) Nada Libman, MD as Referring Physician (Specialist) Collene Gobble, MD as Consulting Physician (Pulmonary Disease) Alexis Frock, MD as Consulting Physician (Urology) Ander Slade, Carlisle Beers, MD as Referring Physician (Ophthalmology) Raina Mina, RPH-CPP (Pharmacist) Corey Harold, MD as Consulting Physician (Ophthalmology)  CHIEF COMPLAINT: Estrogen receptor positive breast cancer  CURRENT TREATMENT: Letrozole, palbociclib  INTERVAL HISTORY:  Immaculate returns today for follow-up of her estrogen receptor positive stage IV breast cancer.   She overall has been tolerating Ibrance and letrozole very well.   She received flu shot on 03/07/2022 Since last visit, she has difficulty swallowing, eating small bites. She has lost 33 lbs in one yr intentionally. She otherwise feels well.  No change in breathing, bowel habits or urinary habits. No new neurological complaints. Rest of the pertinent 10 point ROS reviewed and negative.  We are continuing to follow her tumor marker: Lab Results  Component Value Date   CA2729 31.6 03/30/2022   CA2729 34.1 01/10/2022   CA2729 35.5 09/28/2021   CA2729 37.1 06/29/2021   CA2729 43.1 (H) 04/28/2021    REVIEW OF SYSTEMS:   A detailed review of systems today was otherwise stable.   COVID 19 VACCINATION STATUS: Status post Pfizer x2 followed by booster August 2021; had COVID November 2022   BREAST CANCER HISTORY: From the original intake note:  [name] had  screening mammography showing some suspicious calcifications in the right breast leading to right diagnostic mammography with ultrasonography 01/22/2016 at [facility]. The breast density was category C. In the upper right breast there was a 2.3 cm mass with additional masses measuring 0.9 and 0.7 cm. There was also a possible additional 0.8 mass in the lower inner quadrant. Ultrasound confirmed an irregular hypoechoic mass in the right breast upper outer quadrant measuring 2.0 cm. There were other masses measuring 0.7 and 0.8 cm by ultrasonography. The right axilla was sonographically benign.  Biopsy of a 12:00 and 4:00 mass in the right breast 01/22/2016 showed ([id number]) both specimens showing invasive ductal carcinoma, grade 1 or 2, both 95% estrogen receptor positive, both 95% progesterone receptor positive, both with strong staining intensity, with MIB-1 ranging from 10-15%, and both HER-2 negative, the signals ratio being 1.23-1.42, and the number per cell 1.85-2.59.  Her subsequent history is as detailed below   PAST MEDICAL HISTORY: Past Medical History:  Diagnosis Date   Breast cancer ([place])    Cancer ([place]) 02/2016   right breast   DIABETES MELLITUS, TYPE II 01/04/2007   only takes actoplus daily   Dizziness and giddiness 02/29/2008   DVT, HX OF    at age 76 in right buttocks   Dyspnea    due to lung cancer   Family history of breast cancer    GERD 01/04/2007   pt reports resolved    GLAUCOMA 07/30/2008   both eyes   History of blood transfusion    no abnormal  reaction   History of uterine cancer 2000   hysterectomy done   HYPERLIPIDEMIA 01/04/2007   taking Pravastatin daily   HYPERTENSION 01/04/2007  takes Lisinopril daily   Joint pain    Joint swelling    Leg cramps    LEG PAIN, LEFT 07/06/2007   NUMBNESS 07/30/2008   in fingers;pt states from [facility]   OSTEOARTHRITIS, HIP 09/25/2009   OTITIS MEDIA, ACUTE, BILATERAL 02/29/2008   Overweight(278.02) 01/04/2007   Peripheral  vascular disease ([place])    Personal history of radiation therapy 2018   Pneumonia    PONV (postoperative nausea and vomiting)    SLEEP APNEA, OBSTRUCTIVE    doesn't use a cpap;study done about 32yr ago   [place] 01/04/2007   Vision loss    left eye    PAST SURGICAL HISTORY: Past Surgical History:  Procedure Laterality Date   ABDOMINAL HYSTERECTOMY  2000   BREAST LUMPECTOMY Right 01/13/2017   x2   BREAST LUMPECTOMY WITH RADIOACTIVE SEED AND SENTINEL LYMPH NODE BIOPSY Right 01/13/2017   Procedure: RIGHT BREAST RADIOACTIVE SEED X'S 2 GUIDED LUMPECTOMY WITH RADIOACTIVE SEED TARGETED AXILLARYLYMPH NODE EXCISION AND RIGHT AXILLARY SENTINEL LYMPH NODE BIOPSY;  Surgeon: NAlphonsa Overall MD;  [facility]  Service: General;  Laterality: Right;  2 SEEDS IN RIGHT BREAST 1 SEED IN RIGHT AXILLARY NODE   CHOLECYSTECTOMY     ENDOBRONCHIAL ULTRASOUND Bilateral 03/28/2016   Procedure: ENDOBRONCHIAL ULTRASOUND;  Surgeon: RCollene Gobble MD;  Location: [facility];  Service: Cardiopulmonary;  Laterality: Bilateral;   EYE SURGERY  13   shunt left and lazer eye surgery on right cataract and retenia tear with repair   growth removal  2004   from thumb   KNEE ARTHROSCOPY Right    mulitple eye surgeries     both eyes, cataracts with ioc done both eyes   OOPHORECTOMY     right lumpectomy with axillary node dissection Right 01/2017   TOTAL HIP ARTHROPLASTY  06/24/2011   Procedure: TOTAL HIP ARTHROPLASTY;  Surgeon: FKerin Salen  [facility]  Service: Orthopedics;  Laterality: Right;   TOTAL HIP ARTHROPLASTY Left 11/12/2012   Dr RMayer Camel  TOTAL HIP ARTHROPLASTY Left 11/12/2012   Procedure: TOTAL HIP ARTHROPLASTY;  Surgeon: FKerin Salen MD;  [facility]  Service: Orthopedics;  Laterality: Left;  DEPUY PINNACLE    FAMILY HISTORY Family History  Problem Relation Age of Onset   Breast cancer Mother [phone]  Dementia Mother    Cancer Mother        Breast and lung cancer    Stroke Sister    Breast cancer Sister [phone]  Heart attack Maternal Aunt    Lung cancer Maternal Grandmother        non smoker   Glaucoma Maternal Grandfather    Anesthesia problems Neg Hx   The patient's father died at age 77 the patient's mother died at age 77 She had breast and lung cancers diagnosed shortly before her death. The patient had no brothers, 2 sisters. One sister was diagnosed with breast cancer at the age of 77   GYNECOLOGIC HISTORY:  No LMP recorded. Patient has had a hysterectomy. Menarche age 77 first live birth age 89 the patient is GX P1. She had a hysterectomy for endometrial cancer in the year 2000. She did not take hormone replacement. She did use oral contraceptives for more than 20 years remotely, with no complications.   SOCIAL HISTORY: (Updated July 2021). 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 She is home with her husband  Thomas. He is a retired Dealer.Their son [name] also lives in [place].  The patient has 3 grandchildren aged 43, 52 and 42    ADVANCED DIRECTIVES: In place   HEALTH MAINTENANCE: Social History   Tobacco Use   Smoking status: Never   Smokeless tobacco: Never  Vaping Use   Vaping Use: Never used  Substance Use Topics   Alcohol use: No   Drug use: No     Colonoscopy: Never  PAP: Status post hysterectomy  Bone density: Remote   Allergies  Allergen Reactions   Codeine Hives    Hycodan syrup   Fluorescein Nausea And Vomiting    ? IV dye for retina specialist   Lipitor [Atorvastatin Calcium]     Leg cramp   Oxycodone Nausea And Vomiting    Patient vomited for 3 days after taking   Sitagliptin Phosphate Nausea And Vomiting   Sulfa Drugs Cross Reactors Nausea And Vomiting    Current Outpatient Medications  Medication Sig Dispense Refill   acetaminophen (TYLENOL) 500 MG tablet Take 1,000 mg by mouth every 4 (four) hours as needed for moderate pain or  fever.     aspirin EC 81 MG tablet Take 81 mg by mouth daily at 6 PM. 1700     Biotin 1 MG CAPS Take by mouth.     brimonidine (ALPHAGAN) 0.2 % ophthalmic solution INSTILL 1 DROP INTO EACH EYE THREE TIMES DAILY     cholecalciferol (VITAMIN D) 1000 units tablet Take 1,000 Units by mouth daily.     dorzolamide-timolol (COSOPT) 22.3-6.8 MG/ML ophthalmic solution Place 1 drop into both eyes 2 (two) times daily.     letrozole (FEMARA) 2.5 MG tablet Take 1 tablet (2.5 mg total) by mouth daily. 90 tablet 3   lisinopril (ZESTRIL) 5 MG tablet Take 1 tablet by mouth once daily 90 tablet 0   lovastatin (MEVACOR) 20 MG tablet TAKE 1 TABLET BY MOUTH AT BEDTIME 90 tablet 0   metFORMIN (GLUCOPHAGE-XR) 500 MG 24 hr tablet TAKE 1 TO 2 TABLETS BY MOUTH ONCE DAILY WITH BREAKFAST 180 tablet 0   Netarsudil-Latanoprost (ROCKLATAN) 0.02-0.005 % SOLN Apply to eye.     palbociclib (IBRANCE) 75 MG tablet Take 1 tablet (75 mg total) by mouth daily. Take for 21 days on, 7 days off, repeat every 28 days. 21 tablet 4   prednisoLONE acetate (PRED FORTE) 1 % ophthalmic suspension SMARTSIG:1 In Eye(s) 6 Times Daily     No current facility-administered medications for this visit.     OBJECTIVE: white woman in no acute distress  There were no vitals filed for this visit.    Wt Readings from Last 3 Encounters:  03/30/22 225 lb 1.6 oz (102.1 kg)  01/25/22 228 lb (103.4 kg)  01/10/22 231 lb 11.2 oz (105.1 kg)   There is no height or weight on file to calculate BMI.    ECOG FS:1 - Symptomatic but completely ambulatory  Physical Exam Constitutional:      Appearance: Normal appearance.  Cardiovascular:     Rate and Rhythm: Normal rate and regular rhythm.     Pulses: Normal pulses.     Heart sounds: Normal heart sounds.  Pulmonary:     Effort: Pulmonary effort is normal.     Breath sounds: Normal breath sounds.  Musculoskeletal:        General: Swelling (Baseline lower extremity swelling with some varicose veins)  present. Normal range of motion.     Cervical back: Normal range of motion  and neck supple. No rigidity.  Lymphadenopathy:     Cervical: No cervical adenopathy.  Skin:    General: Skin is warm and dry.  Neurological:     General: No focal deficit present.     Mental Status: She is alert.  Psychiatric:        Mood and Affect: Mood normal.      LAB RESULTS:  CMP     Component Value Date/Time   NA 137 03/30/2022 1243   NA 140 05/29/2017 1254   K 4.4 03/30/2022 1243   K 4.4 05/29/2017 1254   CL 104 03/30/2022 1243   CO2 26 03/30/2022 1243   CO2 25 05/29/2017 1254   GLUCOSE 241 (H) 03/30/2022 1243   GLUCOSE 154 (H) 05/29/2017 1254   BUN 19 03/30/2022 1243   BUN 11.5 05/29/2017 1254   CREATININE 0.82 03/30/2022 1243   CREATININE 0.8 05/29/2017 1254   CALCIUM 9.3 03/30/2022 1243   CALCIUM 9.3 05/29/2017 1254   PROT 7.1 03/30/2022 1243   PROT 7.0 05/29/2017 1254   ALBUMIN 4.2 03/30/2022 1243   ALBUMIN 4.1 05/29/2017 1254   AST 11 (L) 03/30/2022 1243   AST 13 05/29/2017 1254   ALT 13 03/30/2022 1243   ALT 14 05/29/2017 1254   ALKPHOS 81 03/30/2022 1243   ALKPHOS 66 05/29/2017 1254   BILITOT 0.5 03/30/2022 1243   BILITOT 0.50 05/29/2017 1254   GFRNONAA >60 03/30/2022 1243   GFRAA >60 03/10/2020 1138    INo results found for: "SPEP", "UPEP"  Lab Results  Component Value Date   WBC 2.6 (L) 03/30/2022   NEUTROABS 1.6 (L) 03/30/2022   HGB 13.7 03/30/2022   HCT 39.5 03/30/2022   MCV 94.0 03/30/2022   PLT 197 03/30/2022      Chemistry      Component Value Date/Time   NA 137 03/30/2022 1243   NA 140 05/29/2017 1254   K 4.4 03/30/2022 1243   K 4.4 05/29/2017 1254   CL 104 03/30/2022 1243   CO2 26 03/30/2022 1243   CO2 25 05/29/2017 1254   BUN 19 03/30/2022 1243   BUN 11.5 05/29/2017 1254   CREATININE 0.82 03/30/2022 1243   CREATININE 0.8 05/29/2017 1254      Component Value Date/Time   CALCIUM 9.3 03/30/2022 1243   CALCIUM 9.3 05/29/2017 1254   ALKPHOS  81 03/30/2022 1243   ALKPHOS 66 05/29/2017 1254   AST 11 (L) 03/30/2022 1243   AST 13 05/29/2017 1254   ALT 13 03/30/2022 1243   ALT 14 05/29/2017 1254   BILITOT 0.5 03/30/2022 1243   BILITOT 0.50 05/29/2017 1254       No results found for: "LABCA2"  No components found for: "LABCA125"  No results for input(s): "INR" in the last 168 hours.  Urinalysis    Component Value Date/Time   COLORURINE YELLOW 08/18/2017 1322   APPEARANCEUR HAZY (A) 08/18/2017 1322   LABSPEC 1.010 08/18/2017 1322   PHURINE 7.0 08/18/2017 1322   GLUCOSEU NEGATIVE 08/18/2017 1322   GLUCOSEU NEGATIVE 10/30/2014 1513   HGBUR LARGE (A) 08/18/2017 1322   BILIRUBINUR NEGATIVE 08/18/2017 1322   KETONESUR NEGATIVE 08/18/2017 1322   PROTEINUR 30 (A) 08/18/2017 1322   UROBILINOGEN 0.2 10/30/2014 1513   NITRITE NEGATIVE 08/18/2017 1322   LEUKOCYTESUR LARGE (A) 08/18/2017 1322    STUDIES: No results found.   ELIGIBLE FOR AVAILABLE RESEARCH PROTOCOL: no  ASSESSMENT: 77 y.o. [place] woman with a remote history of early stage endometrial cancer,  subsequently status post right breast upper outer quadrant biopsy 01/22/2016 for a clinically multifocal T2 N0, stage 2A invasive ductal carcinoma, grade 1, estrogen and progesterone receptor positive, HER-2 negative, with an MIB-1 between 10 and 15%.  (1) right axillary lymph node biopsy 03/02/2016 positive  (2) genetics testing 01/13/2016 through the Custom gene panel offered by [facility] found no deleterious mutations in  ATM, BARD1, BRCA1, BRCA2, BRIP1, CDH1, CHEK2, EPCAM, FANCC, MLH1, MSH2, MSH6, MUTYH, NBN, PALB2, PMS2, POLD1, PTEN, RAD51C, RAD51D, TP53, and XRCC2  METASTATIC DISEASE: OCT 2017 (3) CT scans of the chest abdomen and pelvis obtained 03/10/2016 are consistent with bilateral lung metastases and mediastinal and hilar nodal involvement, but no liver or bone spread  (a) bronchoscopic lymph node biopsy 2 (station 7, 13R) 03/28/2016 confirms  metastatic adenocarcinoma, estrogen receptor positive, HER-2 not amplified  (b) baseline CA-27-29 on 04/18/2016 was 137.5.  (4) letrozole started 03/15/2016, palbociclib added 03/29/2016 at 125 mg/day, 21/7  (a) dose decreased to 100 mg per day, 21/7, beginning with February cycle  (b) palbociclib held 01/31/2017, with increasing symptoms  (c) palbociclib resumed October 2018 at 75 mg daily  (d) palbociclib dose reduced to 75 mg every other day February through April 2019  (e) palbociclib dose resumed at 75 mg daily as of 10/17/2017  (f) palbociclib dose decreased to 75 mg every other day beginning 07/31/2019  (g) palbociclib resumed at 75 mg daily, 21 days on 7 off, as of 08/25/2019  (5) status post double right lumpectomies and right axillary lymph node sampling 01/13/2017 for 2 separate invasive ductal carcinoma lesions, pT1a and pT1b, N1a, with negative margins, both lesions being estrogen and progesterone receptor positive and HER-2 negative  (6) adjuvant radiation completed 07/05/2017 1. 50.4 Gy in 28 fractions to the right breast and supraclavicular region using whole-breast tangent fields. 2. Boost to the seroma delivered an additional 10 Gy in 5 fractions. The total dose was 60.4 Gy.  (7) restaging studies:  (a) CT scan of the chest and bone scan 06/23/2017 showed stable scattered very small lung nodules, no bone lesions  (b) CT of the chest 10/17/2017 showed no new or progressive metastatic disease in the chest. The small left lower lobe pulmonary nodule is stable  (c) PET scan on 03/02/2018: shows no findings for residual or recurrent right breast cancer  (d) chest CT scan stable, questionable right renal cyst noted  (e) chest CT 09/18/2020 shows no measurable disease  (f) to the CT scan 04/29/2021 shows no evidence of active disease; a 2 cm thyroid nodule was noted   (8) right upper pole renal lesion noted to be enlarging on CT scan 06/22/2017  (a) no uptake on PET scan obtained  03/02/2018  #9 Most recent imaging with no new suspicious mass or lymphadenopathy identified in the chest abdomen or pelvis.  Stable chronic pleural and parenchymal scarring densities in the right lung apex.  Stable chronic moderate to severe biliary ductal dilatation.  Stable chronic 11 mm left adrenal gland nodule.  No dedicated follow-up required   PLAN:  She continues on Ibrance 75 mg once daily 3 weeks on 1 week off and letrozole daily.   She has been doing imaging every 6 months and following up with Dr. Jana Hakim every 3 months.   She continues to do well. Denies any new concerns PE stable. CBC, stable leukopenia and neutropenia. Repeat imaging in April Mammogram in May. RTC in 3 months or sooner as needed She doesn't want the swallowing difficulty to  be investigated.  Total time spent: 30 min  Thank you for consulting [place] in the care of this patient.  Please not hesitate contact us with any additional questions or concerns.  Total time spent: 30 minutes *Total Encounter Time as defined by the Centers for Medicare and Medicaid Services includes, in addition to the face-to-face time of a patient visit (documented in the note above) non-face-to-face time: obtaining and reviewing outside history, ordering and reviewing medications, tests or procedures, care coordination (communications with other health care professionals or caregivers) and documentation in the medical record.

## 2022-06-21 ENCOUNTER — Inpatient Hospital Stay (HOSPITAL_BASED_OUTPATIENT_CLINIC_OR_DEPARTMENT_OTHER): Payer: PPO | Admitting: Hematology and Oncology

## 2022-06-21 ENCOUNTER — Inpatient Hospital Stay: Payer: PPO | Attending: Hematology and Oncology

## 2022-06-21 ENCOUNTER — Other Ambulatory Visit: Payer: Self-pay

## 2022-06-21 ENCOUNTER — Encounter: Payer: Self-pay | Admitting: Hematology and Oncology

## 2022-06-21 VITALS — BP 165/76 | HR 91 | Temp 98.1°F | Resp 16 | Ht 65.0 in | Wt 219.6 lb

## 2022-06-21 DIAGNOSIS — C50411 Malignant neoplasm of upper-outer quadrant of right female breast: Secondary | ICD-10-CM

## 2022-06-21 DIAGNOSIS — Z170 Estrogen receptor positive status [ER+]: Secondary | ICD-10-CM | POA: Insufficient documentation

## 2022-06-21 DIAGNOSIS — C7802 Secondary malignant neoplasm of left lung: Secondary | ICD-10-CM | POA: Diagnosis not present

## 2022-06-21 DIAGNOSIS — Z801 Family history of malignant neoplasm of trachea, bronchus and lung: Secondary | ICD-10-CM | POA: Insufficient documentation

## 2022-06-21 DIAGNOSIS — C7801 Secondary malignant neoplasm of right lung: Secondary | ICD-10-CM | POA: Diagnosis not present

## 2022-06-21 DIAGNOSIS — E278 Other specified disorders of adrenal gland: Secondary | ICD-10-CM | POA: Insufficient documentation

## 2022-06-21 DIAGNOSIS — D72819 Decreased white blood cell count, unspecified: Secondary | ICD-10-CM | POA: Insufficient documentation

## 2022-06-21 DIAGNOSIS — E1151 Type 2 diabetes mellitus with diabetic peripheral angiopathy without gangrene: Secondary | ICD-10-CM | POA: Diagnosis not present

## 2022-06-21 DIAGNOSIS — Z79811 Long term (current) use of aromatase inhibitors: Secondary | ICD-10-CM | POA: Diagnosis not present

## 2022-06-21 DIAGNOSIS — Z923 Personal history of irradiation: Secondary | ICD-10-CM | POA: Insufficient documentation

## 2022-06-21 DIAGNOSIS — I10 Essential (primary) hypertension: Secondary | ICD-10-CM | POA: Diagnosis not present

## 2022-06-21 DIAGNOSIS — Z90710 Acquired absence of both cervix and uterus: Secondary | ICD-10-CM | POA: Insufficient documentation

## 2022-06-21 DIAGNOSIS — Z803 Family history of malignant neoplasm of breast: Secondary | ICD-10-CM | POA: Insufficient documentation

## 2022-06-21 LAB — CMP (CANCER CENTER ONLY)
ALT: 15 U/L (ref 0–44)
AST: 13 U/L — ABNORMAL LOW (ref 15–41)
Albumin: 4.2 g/dL (ref 3.5–5.0)
Alkaline Phosphatase: 74 U/L (ref 38–126)
Anion gap: 9 (ref 5–15)
BUN: 19 mg/dL (ref 8–23)
CO2: 23 mmol/L (ref 22–32)
Calcium: 9.3 mg/dL (ref 8.9–10.3)
Chloride: 103 mmol/L (ref 98–111)
Creatinine: 0.83 mg/dL (ref 0.44–1.00)
GFR, Estimated: >60 mL/min (ref >60)
Glucose, Bld: 391 mg/dL — ABNORMAL HIGH (ref 70–99)
Potassium: 4.7 mmol/L (ref 3.5–5.1)
Sodium: 135 mmol/L (ref 135–145)
Total Bilirubin: 0.5 mg/dL (ref 0.3–1.2)
Total Protein: 7 g/dL (ref 6.5–8.1)

## 2022-06-21 LAB — CBC WITH DIFFERENTIAL (CANCER CENTER ONLY)
Abs Immature Granulocytes: 0.01 10*3/uL (ref 0.00–0.07)
Basophils Absolute: 0 10*3/uL (ref 0.0–0.1)
Basophils Relative: 1 %
Eosinophils Absolute: 0 10*3/uL (ref 0.0–0.5)
Eosinophils Relative: 1 %
HCT: 37.3 % (ref 36.0–46.0)
Hemoglobin: 13.2 g/dL (ref 12.0–15.0)
Immature Granulocytes: 0 %
Lymphocytes Relative: 28 %
Lymphs Abs: 0.7 10*3/uL (ref 0.7–4.0)
MCH: 32.6 pg (ref 26.0–34.0)
MCHC: 35.4 g/dL (ref 30.0–36.0)
MCV: 92.1 fL (ref 80.0–100.0)
Monocytes Absolute: 0.2 10*3/uL (ref 0.1–1.0)
Monocytes Relative: 6 %
Neutro Abs: 1.6 10*3/uL — ABNORMAL LOW (ref 1.7–7.7)
Neutrophils Relative %: 64 %
Platelet Count: 166 10*3/uL (ref 150–400)
RBC: 4.05 MIL/uL (ref 3.87–5.11)
RDW: 14.2 % (ref 11.5–15.5)
WBC Count: 2.5 10*3/uL — ABNORMAL LOW (ref 4.0–10.5)
nRBC: 0 % (ref 0.0–0.2)

## 2022-06-22 ENCOUNTER — Telehealth: Payer: Self-pay | Admitting: Internal Medicine

## 2022-06-22 ENCOUNTER — Encounter: Payer: Self-pay | Admitting: Family Medicine

## 2022-06-22 ENCOUNTER — Telehealth (INDEPENDENT_AMBULATORY_CARE_PROVIDER_SITE_OTHER): Payer: PPO | Admitting: Family Medicine

## 2022-06-22 DIAGNOSIS — R739 Hyperglycemia, unspecified: Secondary | ICD-10-CM

## 2022-06-22 DIAGNOSIS — E1169 Type 2 diabetes mellitus with other specified complication: Secondary | ICD-10-CM

## 2022-06-22 LAB — CANCER ANTIGEN 27.29: CA 27.29: 28.7 U/mL (ref 0.0–38.6)

## 2022-06-22 MED ORDER — PIOGLITAZONE HCL 15 MG PO TABS: 15.0000 mg | ORAL_TABLET | Freq: Every day | ORAL | 0 refills | Status: DC

## 2022-06-22 NOTE — Telephone Encounter (Signed)
Patient called to schedule appt because of high Blood sugar. She said she wanted to schedule with Dr Sharlet Salina next available which was 07/01/22. Patient said it was at 328 so I also transfered her to triage nurse for further eval

## 2022-06-22 NOTE — Telephone Encounter (Signed)
Noted will forward this over to Dr Sharlet Salina

## 2022-06-22 NOTE — Progress Notes (Signed)
Virtual telephone visit    Virtual Visit via Telephone Note   This visit type was conducted due to national recommendations for restrictions regarding the COVID-19 Pandemic (e.g. social distancing) in an effort to limit this patient's exposure and mitigate transmission in our community. Due to her co-morbid illnesses, this patient is at least at moderate risk for complications without adequate follow up. This format is felt to be most appropriate for this patient at this time. The patient did not have access to video technology or had technical difficulties with video requiring transitioning to audio format only (telephone). Physical exam was limited to content and character of the telephone converstion. CMA was able to get the patient set up on a telephone visit.   Patient location: Home. Patient and provider in visit Provider location: Office  I discussed the limitations of evaluation and management by telemedicine and the availability of in person appointments. The patient expressed understanding and agreed to proceed.   Visit Date: 06/22/2022  Today's healthcare provider: Harland Dingwall, NP-C     Subjective:    Patient ID: [name], female    DOB: 06/13/1946, 77 y.o.   MRN: [id number]  Chief Complaint  Patient presents with   Hyperglycemia    Blood sugars 328 today    HPI  Patient aware that her insurance carrier may not cover this telephone only visit.  States she cannot see her phone to do a virtual visit.   C/o blood sugars being elevated. FBS 324 today. States this is not a new problem.   States Actos was stopped last year and she would like to start back on it and it worked to lower her blood sugars.  States she his taking Glucophage- XR 500 mg (2 tablets) daily   Last A1c 7.5% and she has not followed up with her PCP as recommended.  States she has been diagnosed with cancer and having to see the cancer specialist often.   Walking at [employer]. Sleeping  well. Cutting back her carbohydrates.   She has lost 33 lbs   Denies fever, chills, dizziness, chest pain, palpitations, shortness of breath, abdominal pain, N/V/D.      Past Medical History:  Diagnosis Date   Breast cancer ([place])    Cancer ([place]) 02/2016   right breast   DIABETES MELLITUS, TYPE II 01/04/2007   only takes actoplus daily   Dizziness and giddiness 02/29/2008   DVT, HX OF    at age 50 in right buttocks   Dyspnea    due to lung cancer   Family history of breast cancer    GERD 01/04/2007   pt reports resolved    GLAUCOMA 07/30/2008   both eyes   History of blood transfusion    no abnormal  reaction   History of uterine cancer 2000   hysterectomy done   HYPERLIPIDEMIA 01/04/2007   taking Pravastatin daily   HYPERTENSION 01/04/2007   takes Lisinopril daily   Joint pain    Joint swelling    Leg cramps    LEG PAIN, LEFT 07/06/2007   NUMBNESS 07/30/2008   in fingers;pt states from [facility]   OSTEOARTHRITIS, HIP 09/25/2009   OTITIS MEDIA, ACUTE, BILATERAL 02/29/2008   Overweight(278.02) 01/04/2007   Peripheral vascular disease ([place])    Personal history of radiation therapy 2018   Pneumonia    PONV (postoperative nausea and vomiting)    SLEEP APNEA, OBSTRUCTIVE    doesn't use a cpap;study done about 33yr ago  TRANSIENT ISCHEMIC ATTACK, HX OF 01/04/2007   Vision loss    left eye    Past Surgical History:  Procedure Laterality Date   ABDOMINAL HYSTERECTOMY  2000   BREAST LUMPECTOMY Right 01/13/2017   x2   BREAST LUMPECTOMY WITH RADIOACTIVE SEED AND SENTINEL LYMPH NODE BIOPSY Right 01/13/2017   Procedure: RIGHT BREAST RADIOACTIVE SEED X'S 2 GUIDED LUMPECTOMY WITH RADIOACTIVE SEED TARGETED AXILLARYLYMPH NODE EXCISION AND RIGHT AXILLARY SENTINEL LYMPH NODE BIOPSY;  Surgeon: Alphonsa Overall, MD;  Location: [facility];  Service: General;  Laterality: Right;  2 SEEDS IN RIGHT BREAST 1 SEED IN RIGHT AXILLARY NODE   CHOLECYSTECTOMY     ENDOBRONCHIAL ULTRASOUND Bilateral 03/28/2016    Procedure: ENDOBRONCHIAL ULTRASOUND;  Surgeon: Collene Gobble, MD;  Location: [facility];  Service: Cardiopulmonary;  Laterality: Bilateral;   EYE SURGERY  13   shunt left and lazer eye surgery on right cataract and retenia tear with repair   growth removal  2004   from thumb   KNEE ARTHROSCOPY Right    mulitple eye surgeries     both eyes, cataracts with ioc done both eyes   OOPHORECTOMY     right lumpectomy with axillary node dissection Right 01/2017   TOTAL HIP ARTHROPLASTY  06/24/2011   Procedure: TOTAL HIP ARTHROPLASTY;  Surgeon: Kerin Salen;  Location: [facility];  Service: Orthopedics;  Laterality: Right;   TOTAL HIP ARTHROPLASTY Left 11/12/2012   Dr Mayer Camel   TOTAL HIP ARTHROPLASTY Left 11/12/2012   Procedure: TOTAL HIP ARTHROPLASTY;  Surgeon: Kerin Salen, MD;  Location: [facility];  Service: Orthopedics;  Laterality: Left;  DEPUY PINNACLE    Family History  Problem Relation Age of Onset   Breast cancer Mother 67   Dementia Mother    Cancer Mother        Breast and lung cancer   Stroke Sister    Breast cancer Sister 65   Heart attack Maternal Aunt    Lung cancer Maternal Grandmother        non smoker   Glaucoma Maternal Grandfather    Anesthesia problems Neg Hx     Social History   Socioeconomic History   Marital status: Married    Spouse name: Not on file   Number of children: Not on file   Years of education: Not on file   Highest education level: Not on file  Occupational History   Occupation: office administrator    Employer: [employer]  Tobacco Use   Smoking status: Never   Smokeless tobacco: Never  Vaping Use   Vaping Use: Never used  Substance and Sexual Activity   Alcohol use: No   Drug use: No   Sexual activity: Not Currently  Other Topics Concern   Not on file  Social History Narrative   Not on file   Social Determinants of Health   Financial Resource Strain: Low Risk  (02/25/2022)   Overall Financial Resource Strain (CARDIA)     Difficulty of Paying Living Expenses: Not hard at all  Food Insecurity: No Food Insecurity (02/25/2022)   Hunger Vital Sign    Worried About Running Out of Food in the Last Year: Never true    [place] in the Last Year: Never true  Transportation Needs: No Transportation Needs (02/25/2022)   PRAPARE - Hydrologist (Medical): No    Lack of Transportation (Non-Medical): No  Physical Activity: Sufficiently Active (02/25/2022)   Exercise Vital Sign  Days of Exercise per Week: 5 days    Minutes of Exercise per Session: 30 min  Stress: No Stress Concern Present (02/25/2022)   [place]    Feeling of Stress : Not at all  Social Connections: [place] (02/25/2022)   Social Connection and Isolation Panel [NHANES]    Frequency of Communication with Friends and Family: More than three times a week    Frequency of Social Gatherings with Friends and Family: More than three times a week    Attends Religious Services: More than 4 times per year    Active Member of [employer] or Organizations: Yes    Attends Music therapist: More than 4 times per year    Marital Status: Married  Human resources officer Violence: Not At Risk (02/25/2022)   Humiliation, Afraid, Rape, and Kick questionnaire    Fear of Current or Ex-Partner: No    Emotionally Abused: No    Physically Abused: No    Sexually Abused: No    Outpatient Medications Prior to Visit  Medication Sig Dispense Refill   acetaminophen (TYLENOL) 500 MG tablet Take 1,000 mg by mouth every 4 (four) hours as needed for moderate pain or fever.     aspirin EC 81 MG tablet Take 81 mg by mouth daily at 6 PM. 1700     Biotin 1 MG CAPS Take by mouth.     brimonidine (ALPHAGAN) 0.2 % ophthalmic solution INSTILL 1 DROP INTO EACH EYE THREE TIMES DAILY     cholecalciferol (VITAMIN D) 1000 units tablet Take 1,000 Units by mouth daily.      dorzolamide-timolol (COSOPT) 22.3-6.8 MG/ML ophthalmic solution Place 1 drop into both eyes 2 (two) times daily.     letrozole (FEMARA) 2.5 MG tablet Take 1 tablet (2.5 mg total) by mouth daily. 90 tablet 3   lisinopril (ZESTRIL) 5 MG tablet Take 1 tablet by mouth once daily 90 tablet 0   lovastatin (MEVACOR) 20 MG tablet TAKE 1 TABLET BY MOUTH AT BEDTIME 90 tablet 0   metFORMIN (GLUCOPHAGE-XR) 500 MG 24 hr tablet TAKE 1 TO 2 TABLETS BY MOUTH ONCE DAILY WITH BREAKFAST 180 tablet 0   Netarsudil-Latanoprost (ROCKLATAN) 0.02-0.005 % SOLN Apply to eye.     palbociclib (IBRANCE) 75 MG tablet Take 1 tablet (75 mg total) by mouth daily. Take for 21 days on, 7 days off, repeat every 28 days. 21 tablet 4   prednisoLONE acetate (PRED FORTE) 1 % ophthalmic suspension SMARTSIG:1 In Eye(s) 6 Times Daily     No facility-administered medications prior to visit.    Allergies  Allergen Reactions   Codeine Hives    Hycodan syrup   Fluorescein Nausea And Vomiting    ? IV dye for retina specialist   Lipitor [Atorvastatin Calcium]     Leg cramp   Oxycodone Nausea And Vomiting    Patient vomited for 3 days after taking   Sitagliptin Phosphate Nausea And Vomiting   Sulfa Drugs Cross Reactors Nausea And Vomiting    ROS     Objective:    Physical Exam  There were no vitals taken for this visit. Wt Readings from Last 3 Encounters:  06/21/22 219 lb 9.6 oz (99.6 kg)  03/30/22 225 lb 1.6 oz (102.1 kg)  01/25/22 228 lb (103.4 kg)    Alert and oriented. Speaking in complete sentences.     Assessment & Plan:   Problem List Items Addressed This Visit  Endocrine   Diabetes ([place])   Relevant Medications   pioglitazone (ACTOS) 15 MG tablet   Other Visit Diagnoses     Hyperglycemia    -  Primary   Relevant Medications   pioglitazone (ACTOS) 15 MG tablet      She does not sound toxic. States BS in the upper 200 or lower 300 range is not new.  Recommend that she increase Glucophage XR to  1,500 mg in the mornings but she declines to increase this medication. She prefers to restart Actos.  Actos prescribed. She has a follow up in office with Dr. Sharlet Salina on 07/01/2022 and I encouraged her to keep this visit.   I am having Vanessa Kick start on pioglitazone. I am also having her maintain her acetaminophen, aspirin EC, cholecalciferol, dorzolamide-timolol, Biotin, prednisoLONE acetate, Rocklatan, brimonidine, palbociclib, lovastatin, lisinopril, letrozole, and metFORMIN.  Meds ordered this encounter  Medications   pioglitazone (ACTOS) 15 MG tablet    Sig: Take 1 tablet (15 mg total) by mouth daily.    Dispense:  30 tablet    Refill:  0    Order Specific Question:   Supervising Provider    Answer:   Pricilla Holm A [[id number]]     I discussed the assessment and treatment plan with the patient. The patient was provided an opportunity to ask questions and all were answered. The patient agreed with the plan and demonstrated an understanding of the instructions.   The patient was advised to call back or seek an in-person evaluation if the symptoms worsen or if the condition fails to improve as anticipated.  I provided 14 minutes of non-face-to-face time during this encounter.   Harland Dingwall, NP-C [facility] [place] [phone] (phone) [phone] (fax)  [place]

## 2022-06-23 ENCOUNTER — Other Ambulatory Visit (HOSPITAL_COMMUNITY): Payer: Self-pay

## 2022-06-23 ENCOUNTER — Telehealth: Payer: Self-pay | Admitting: Pharmacy Technician

## 2022-06-23 NOTE — Telephone Encounter (Signed)
Oral Oncology Patient Advocate Encounter  Application placed on hold until further notice. Patient approved for funding from [employer]. I will re-open at a later time if needed,  I have spoken with the patient.  Maclane Holloran, CPhT-Adv Oncology Pharmacy Patient Advocate [facility] Direct Number: [phone]  Fax: [phone]  

## 2022-06-23 NOTE — Telephone Encounter (Signed)
What was result of this?

## 2022-06-23 NOTE — Telephone Encounter (Signed)
Patient was advised by nurse from triage to take Pioglitazone-metformin. Patient states that she is feeling better. Patient has been scheduled for 3pm on Monday

## 2022-06-23 NOTE — Telephone Encounter (Signed)
Oral Oncology Patient Advocate Encounter  Was successful in securing patient a $15,000 grant from [employer] to provide copayment coverage for [place].  This will keep the out of pocket expense at $0.     [employer] ID: [id number]  I have spoken with the patient.   The billing information is as follows and has been shared with [facility].    RxBin: [id number] PCN: [id number] Member ID: [id number] Group ID: [id number] Dates of Eligibility: 05/24/22 through 05/24/23  Fund:  [place], CPhT-Adv Oncology Pharmacy Patient [place] Direct Number: [phone]  Fax: [phone]

## 2022-06-27 ENCOUNTER — Ambulatory Visit (INDEPENDENT_AMBULATORY_CARE_PROVIDER_SITE_OTHER): Payer: PPO | Admitting: Internal Medicine

## 2022-06-27 ENCOUNTER — Encounter: Payer: Self-pay | Admitting: Internal Medicine

## 2022-06-27 VITALS — BP 140/80 | HR 82 | Temp 98.3°F | Ht 65.0 in | Wt 219.5 lb

## 2022-06-27 DIAGNOSIS — E1169 Type 2 diabetes mellitus with other specified complication: Secondary | ICD-10-CM

## 2022-06-27 LAB — POCT GLYCOSYLATED HEMOGLOBIN (HGB A1C): HbA1c POC (<> result, manual entry): 9.5 % (ref 4.0–5.6)

## 2022-06-27 MED ORDER — PIOGLITAZONE HCL-METFORMIN HCL 15-850 MG PO TABS: 1.0000 | ORAL_TABLET | Freq: Every day | ORAL | 3 refills | Status: DC

## 2022-06-27 NOTE — Patient Instructions (Signed)
Your HgA1c is 9.5. We have sent in the actos/metformin to take 1 pill daily.

## 2022-06-27 NOTE — Assessment & Plan Note (Signed)
POC HgA1c worse at 9.5 today which is consistent with sugars averaging around 300. This is uncontrolled and she is feeling symptoms from this. Rx actos/metformin 15/850 1 pill daily. She has taken this in the past and done well. She will stop separate actos and metformin (she did not tolerate separate actos but tolerates actos/metformin in the past). If this is financially unaffordable she will let us know.

## 2022-06-27 NOTE — Assessment & Plan Note (Signed)
She is still working on weight loss and is down 10 pounds since August 2023 and still working on further weight loss with metformin and diet/exercise.

## 2022-06-27 NOTE — Progress Notes (Signed)
   Subjective:   Patient ID: [name], female    DOB: September 26, 1945, 77 y.o.   MRN: [id number]  HPI The patient is a 77 YO female coming in for follow up and high sugars.  Review of Systems  Constitutional:  Positive for fatigue.  HENT: Negative.    Eyes: Negative.   Respiratory:  Negative for cough, chest tightness and shortness of breath.   Cardiovascular:  Negative for chest pain, palpitations and leg swelling.  Gastrointestinal:  Negative for abdominal distention, abdominal pain, constipation, diarrhea, nausea and vomiting.  Musculoskeletal: Negative.   Skin: Negative.   Neurological: Negative.   Psychiatric/Behavioral: Negative.      Objective:  Physical Exam Constitutional:      Appearance: She is well-developed.  HENT:     Head: Normocephalic and atraumatic.  Cardiovascular:     Rate and Rhythm: Normal rate and regular rhythm.  Pulmonary:     Effort: Pulmonary effort is normal. No respiratory distress.     Breath sounds: Normal breath sounds. No wheezing or rales.  Abdominal:     General: Bowel sounds are normal. There is no distension.     Palpations: Abdomen is soft.     Tenderness: There is no abdominal tenderness. There is no rebound.  Musculoskeletal:     Cervical back: Normal range of motion.  Skin:    General: Skin is warm and dry.  Neurological:     Mental Status: She is alert and oriented to person, place, and time.     Coordination: Coordination normal.     Vitals:   06/27/22 1459 06/27/22 1506  BP: (!) 140/80 (!) 140/80  Pulse: 82   Temp: 98.3 F (36.8 C)   TempSrc: Oral   SpO2: 95%   Weight: 219 lb 8 oz (99.6 kg)   Height: '5\' 5"'$  (1.651 m)     Assessment & Plan:

## 2022-07-01 ENCOUNTER — Ambulatory Visit: Payer: PPO | Admitting: Internal Medicine

## 2022-07-04 ENCOUNTER — Ambulatory Visit: Payer: PPO | Admitting: Internal Medicine

## 2022-07-05 ENCOUNTER — Other Ambulatory Visit (HOSPITAL_COMMUNITY): Payer: Self-pay

## 2022-07-05 NOTE — Telephone Encounter (Signed)
Oral Oncology Patient Advocate Encounter   Received notification re-enrollment for assistance for [place] through [place] has been approved. Patient may continue to receive their medication at $0 from this program.    [employer] [facility] phone number [phone].   Effective dates: 07/05/22 through 06/13/23  I have spoken to the patient and they prefer to continue filling with [facility] rather than using their [place], [place] Patient [place] Direct Number: [phone]  Fax: [phone]

## 2022-07-11 ENCOUNTER — Other Ambulatory Visit: Payer: Self-pay | Admitting: Internal Medicine

## 2022-07-11 NOTE — Telephone Encounter (Signed)
Please refill as per office routine med refill policy (all routine meds to be refilled for 3 mo or monthly (per pt preference) up to one year from last visit, then month to month grace period for 3 mo, then further med refills will have to be denied)

## 2022-07-29 ENCOUNTER — Ambulatory Visit: Payer: PPO | Admitting: Internal Medicine

## 2022-08-16 ENCOUNTER — Telehealth: Payer: Self-pay

## 2022-08-16 NOTE — Telephone Encounter (Signed)
Returned Pt's call regarding questions for Val. Instructed Pt that Val is not available today and asked if there was anything I can help with. Pt denied and asked if I could have Val call her tomorrow. Pt denied urgent medical needs and would not relay her questions to me. Left desk note for Val to call back.

## 2022-08-18 ENCOUNTER — Other Ambulatory Visit: Payer: Self-pay | Admitting: *Deleted

## 2022-08-18 DIAGNOSIS — Z170 Estrogen receptor positive status [ER+]: Secondary | ICD-10-CM

## 2022-08-29 ENCOUNTER — Ambulatory Visit: Payer: PPO | Admitting: Internal Medicine

## 2022-08-31 ENCOUNTER — Ambulatory Visit (INDEPENDENT_AMBULATORY_CARE_PROVIDER_SITE_OTHER): Payer: PPO | Admitting: Internal Medicine

## 2022-08-31 ENCOUNTER — Encounter: Payer: Self-pay | Admitting: Internal Medicine

## 2022-08-31 VITALS — BP 161/81 | HR 67 | Temp 98.0°F | Ht 65.0 in | Wt 222.0 lb

## 2022-08-31 DIAGNOSIS — E1169 Type 2 diabetes mellitus with other specified complication: Secondary | ICD-10-CM | POA: Diagnosis not present

## 2022-08-31 DIAGNOSIS — Z0000 Encounter for general adult medical examination without abnormal findings: Secondary | ICD-10-CM | POA: Diagnosis not present

## 2022-08-31 DIAGNOSIS — T451X5A Adverse effect of antineoplastic and immunosuppressive drugs, initial encounter: Secondary | ICD-10-CM | POA: Diagnosis not present

## 2022-08-31 DIAGNOSIS — G620 Drug-induced polyneuropathy: Secondary | ICD-10-CM

## 2022-08-31 DIAGNOSIS — E785 Hyperlipidemia, unspecified: Secondary | ICD-10-CM

## 2022-08-31 DIAGNOSIS — I10 Essential (primary) hypertension: Secondary | ICD-10-CM | POA: Diagnosis not present

## 2022-08-31 DIAGNOSIS — I700 Atherosclerosis of aorta: Secondary | ICD-10-CM | POA: Diagnosis not present

## 2022-08-31 MED ORDER — LISINOPRIL 5 MG PO TABS: 5.0000 mg | ORAL_TABLET | Freq: Every day | ORAL | 3 refills | Status: DC

## 2022-08-31 MED ORDER — LOVASTATIN 20 MG PO TABS: 20.0000 mg | ORAL_TABLET | Freq: Every day | ORAL | 3 refills | Status: DC

## 2022-08-31 NOTE — Progress Notes (Signed)
   Subjective:   Patient ID: [name], female    DOB: December 27, 1945, 77 y.o.   MRN: [id number]  HPI The patient is here for physical.   PMH, [facility], social history reviewed and updated  Review of Systems  Constitutional: Negative.   HENT: Negative.    Eyes: Negative.   Respiratory:  Negative for cough, chest tightness and shortness of breath.   Cardiovascular:  Negative for chest pain, palpitations and leg swelling.  Gastrointestinal:  Negative for abdominal distention, abdominal pain, constipation, diarrhea, nausea and vomiting.  Musculoskeletal: Negative.   Skin: Negative.   Neurological: Negative.   Psychiatric/Behavioral: Negative.      Objective:  Physical Exam Constitutional:      Appearance: She is well-developed.  HENT:     Head: Normocephalic and atraumatic.  Cardiovascular:     Rate and Rhythm: Normal rate and regular rhythm.  Pulmonary:     Effort: Pulmonary effort is normal. No respiratory distress.     Breath sounds: Normal breath sounds. No wheezing or rales.  Abdominal:     General: Bowel sounds are normal. There is no distension.     Palpations: Abdomen is soft.     Tenderness: There is no abdominal tenderness. There is no rebound.  Musculoskeletal:     Cervical back: Normal range of motion.  Skin:    General: Skin is warm and dry.  Neurological:     Mental Status: She is alert and oriented to person, place, and time.     Coordination: Coordination normal.     Vitals:   08/31/22 1023 08/31/22 1034  BP: (!) 161/81 (!) 161/81  Pulse: 67   Temp: 98 F (36.7 C)   TempSrc: Oral   SpO2: 97%   Weight: 222 lb (100.7 kg)   Height: 5\' 5"  (1.651 m)     Assessment & Plan:

## 2022-08-31 NOTE — Assessment & Plan Note (Signed)
Uncontrolled and started actoplus 15/850 1 pill daily about 2 months ago. She is too early for HgA1c so ordered this for 1-2 months from now. She is having reduction in home sugar readings. Also ordered lipid panel and microalbumin to creatinine ratio. Up to date on eye exam. Foot exam.

## 2022-08-31 NOTE — Assessment & Plan Note (Signed)
Flu shot up to date. Covid-19 counseled. Pneumonia complete. Shingrix counseled get at pharmacy. Tetanus counseled due at pharmacy. Colonoscopy aged out. Mammogram aged out, pap smear aged out and dexa complete. Counseled about sun safety and mole surveillance. Counseled about the dangers of distracted driving. Given 10 year screening recommendations.

## 2022-08-31 NOTE — Assessment & Plan Note (Signed)
Cancer center monitoring CMP and stable. BP elevated today and patient states normal at home does not want to adjust medications. Taking lisinopril 5 mg daily.

## 2022-08-31 NOTE — Assessment & Plan Note (Signed)
Taking aspirin daily and lovastatin. Will continue.

## 2022-08-31 NOTE — Assessment & Plan Note (Signed)
Checking lipid panel with next lab draw. Adjust lovastatin 20 mg daily as needed for LDL <100.

## 2022-08-31 NOTE — Assessment & Plan Note (Signed)
Overall stable and no medication needed at this time.

## 2022-09-14 ENCOUNTER — Encounter (HOSPITAL_COMMUNITY): Payer: Self-pay

## 2022-09-14 ENCOUNTER — Other Ambulatory Visit: Payer: PPO

## 2022-09-14 ENCOUNTER — Other Ambulatory Visit: Payer: Self-pay

## 2022-09-14 ENCOUNTER — Inpatient Hospital Stay: Payer: PPO | Attending: Hematology and Oncology

## 2022-09-14 ENCOUNTER — Ambulatory Visit (HOSPITAL_COMMUNITY)
Admission: RE | Admit: 2022-09-14 | Discharge: 2022-09-14 | Disposition: A | Discharge status: Home / Self Care | Payer: PPO | Source: Ambulatory Visit | Attending: Hematology and Oncology | Admitting: Hematology and Oncology

## 2022-09-14 DIAGNOSIS — Z170 Estrogen receptor positive status [ER+]: Secondary | ICD-10-CM | POA: Insufficient documentation

## 2022-09-14 DIAGNOSIS — Z803 Family history of malignant neoplasm of breast: Secondary | ICD-10-CM | POA: Diagnosis not present

## 2022-09-14 DIAGNOSIS — C50919 Malignant neoplasm of unspecified site of unspecified female breast: Secondary | ICD-10-CM | POA: Diagnosis not present

## 2022-09-14 DIAGNOSIS — J984 Other disorders of lung: Secondary | ICD-10-CM | POA: Insufficient documentation

## 2022-09-14 DIAGNOSIS — Z90710 Acquired absence of both cervix and uterus: Secondary | ICD-10-CM | POA: Insufficient documentation

## 2022-09-14 DIAGNOSIS — C50411 Malignant neoplasm of upper-outer quadrant of right female breast: Secondary | ICD-10-CM | POA: Insufficient documentation

## 2022-09-14 DIAGNOSIS — Z801 Family history of malignant neoplasm of trachea, bronchus and lung: Secondary | ICD-10-CM | POA: Insufficient documentation

## 2022-09-14 DIAGNOSIS — Z90721 Acquired absence of ovaries, unilateral: Secondary | ICD-10-CM | POA: Insufficient documentation

## 2022-09-14 DIAGNOSIS — Z79811 Long term (current) use of aromatase inhibitors: Secondary | ICD-10-CM | POA: Insufficient documentation

## 2022-09-14 DIAGNOSIS — C7801 Secondary malignant neoplasm of right lung: Secondary | ICD-10-CM | POA: Insufficient documentation

## 2022-09-14 DIAGNOSIS — C7802 Secondary malignant neoplasm of left lung: Secondary | ICD-10-CM | POA: Insufficient documentation

## 2022-09-14 DIAGNOSIS — Z8542 Personal history of malignant neoplasm of other parts of uterus: Secondary | ICD-10-CM | POA: Diagnosis not present

## 2022-09-14 DIAGNOSIS — D72819 Decreased white blood cell count, unspecified: Secondary | ICD-10-CM | POA: Diagnosis not present

## 2022-09-14 LAB — CMP (CANCER CENTER ONLY)
ALT: 11 U/L (ref 0–44)
AST: 11 U/L — ABNORMAL LOW (ref 15–41)
Albumin: 4.3 g/dL (ref 3.5–5.0)
Alkaline Phosphatase: 61 U/L (ref 38–126)
Anion gap: 6 (ref 5–15)
BUN: 22 mg/dL (ref 8–23)
CO2: 26 mmol/L (ref 22–32)
Calcium: 9.4 mg/dL (ref 8.9–10.3)
Chloride: 106 mmol/L (ref 98–111)
Creatinine: 0.77 mg/dL (ref 0.44–1.00)
GFR, Estimated: >60 mL/min (ref >60)
Glucose, Bld: 170 mg/dL — ABNORMAL HIGH (ref 70–99)
Potassium: 4.3 mmol/L (ref 3.5–5.1)
Sodium: 138 mmol/L (ref 135–145)
Total Bilirubin: 0.5 mg/dL (ref 0.3–1.2)
Total Protein: 7.2 g/dL (ref 6.5–8.1)

## 2022-09-14 LAB — CBC WITH DIFFERENTIAL (CANCER CENTER ONLY)
Abs Immature Granulocytes: 0 10*3/uL (ref 0.00–0.07)
Basophils Absolute: 0 10*3/uL (ref 0.0–0.1)
Basophils Relative: 1 %
Eosinophils Absolute: 0 10*3/uL (ref 0.0–0.5)
Eosinophils Relative: 1 %
HCT: 36.7 % (ref 36.0–46.0)
Hemoglobin: 12.4 g/dL (ref 12.0–15.0)
Immature Granulocytes: 0 %
Lymphocytes Relative: 34 %
Lymphs Abs: 0.8 10*3/uL (ref 0.7–4.0)
MCH: 32.5 pg (ref 26.0–34.0)
MCHC: 33.8 g/dL (ref 30.0–36.0)
MCV: 96.1 fL (ref 80.0–100.0)
Monocytes Absolute: 0.2 10*3/uL (ref 0.1–1.0)
Monocytes Relative: 9 %
Neutro Abs: 1.4 10*3/uL — ABNORMAL LOW (ref 1.7–7.7)
Neutrophils Relative %: 55 %
Platelet Count: 189 10*3/uL (ref 150–400)
RBC: 3.82 MIL/uL — ABNORMAL LOW (ref 3.87–5.11)
RDW: 14.6 % (ref 11.5–15.5)
WBC Count: 2.5 10*3/uL — ABNORMAL LOW (ref 4.0–10.5)
nRBC: 0 % (ref 0.0–0.2)

## 2022-09-14 MED ORDER — SODIUM CHLORIDE (PF) 0.9 % IJ SOLN
INTRAMUSCULAR | Status: AC
  Filled 2022-09-14: qty 50

## 2022-09-14 MED ORDER — IOHEXOL 300 MG/ML  SOLN
100.0000 mL | Freq: Once | INTRAMUSCULAR | Status: AC | PRN
  Administered 2022-09-14: 100 mL via INTRAVENOUS

## 2022-09-15 LAB — CANCER ANTIGEN 27.29: CA 27.29: 26.1 U/mL (ref 0.0–38.6)

## 2022-09-20 ENCOUNTER — Other Ambulatory Visit: Payer: PPO

## 2022-09-20 ENCOUNTER — Other Ambulatory Visit: Payer: Self-pay

## 2022-09-20 ENCOUNTER — Inpatient Hospital Stay: Payer: PPO | Admitting: Hematology and Oncology

## 2022-09-20 VITALS — BP 164/80 | HR 83 | Temp 98.1°F | Resp 16 | Ht 65.0 in | Wt 221.8 lb

## 2022-09-20 DIAGNOSIS — C50411 Malignant neoplasm of upper-outer quadrant of right female breast: Secondary | ICD-10-CM | POA: Diagnosis not present

## 2022-09-20 DIAGNOSIS — Z170 Estrogen receptor positive status [ER+]: Secondary | ICD-10-CM | POA: Diagnosis not present

## 2022-09-20 NOTE — Progress Notes (Signed)
[facility]  [phone] [phone]     ID: ARSHI CHUC DOB: 08/30/1945  MR#: [id number]  EYC#:[id number]  Patient Care Team: Myrlene Broker, MD as PCP - General (Internal Medicine) Ovidio Kin, MD as Consulting Physician (General Surgery) Magrinat, Valentino Hue, MD (Inactive) as Consulting Physician (Oncology) Dorothy Puffer, MD as Consulting Physician (Radiation Oncology) Carrington Clamp, MD as Consulting Physician (Obstetrics and Gynecology) Belva Chimes, MD as Referring Physician (Specialist) Leslye Peer, MD as Consulting Physician (Pulmonary Disease) Sebastian Ache, MD as Consulting Physician (Urology) Loraine Grip, Harrietta Guardian, MD as Referring Physician (Ophthalmology) Anselm Lis, RPH-CPP (Pharmacist) Augustin Schooling, MD as Consulting Physician (Ophthalmology)  CHIEF COMPLAINT: Estrogen receptor positive breast cancer  CURRENT TREATMENT: Letrozole, palbociclib  INTERVAL HISTORY:  [name] returns today for follow-up of her estrogen receptor positive stage IV breast cancer.   She overall has been tolerating Ibrance and letrozole very well.   She had a scan recently, likes to go through it in detail.  She had a copy printed out and read through it.  She also had some concerns about billing issues and wondered if it is related to my billing.  She says she has a bill for 120 bucks back from January and she refuses to pay it. She had a long letter written about how she is going to reject this pill. She reports some intermittent pains on the right chest wall, in the rib cage especially with certain movements.  Once again this is not routine pain.  No particular aggravating factors or relieving factors. Otherwise she is tolerating Ibrance remarkably well.  No new concerns. Rest of the pertinent 10 point ROS reviewed and negative.  We are continuing to follow her tumor marker: Lab Results  Component Value Date   CA2729 26.1 09/14/2022    CA2729 28.7 06/21/2022   CA2729 31.6 03/30/2022   CA2729 34.1 01/10/2022   CA2729 35.5 09/28/2021    REVIEW OF SYSTEMS:   A detailed review of systems today was otherwise stable.   COVID 19 VACCINATION STATUS: Status post Pfizer x2 followed by booster August 2021; had COVID November 2022   BREAST CANCER HISTORY: From the original intake note:  [name] had screening mammography showing some suspicious calcifications in the right breast leading to right diagnostic mammography with ultrasonography 01/22/2016 at [facility]. The breast density was category C. In the upper right breast there was a 2.3 cm mass with additional masses measuring 0.9 and 0.7 cm. There was also a possible additional 0.8 mass in the lower inner quadrant. Ultrasound confirmed an irregular hypoechoic mass in the right breast upper outer quadrant measuring 2.0 cm. There were other masses measuring 0.7 and 0.8 cm by ultrasonography. The right axilla was sonographically benign.  Biopsy of a 12:00 and 4:00 mass in the right breast 01/22/2016 showed ([id number]) both specimens showing invasive ductal carcinoma, grade 1 or 2, both 95% estrogen receptor positive, both 95% progesterone receptor positive, both with strong staining intensity, with MIB-1 ranging from 10-15%, and both HER-2 negative, the signals ratio being 1.23-1.42, and the number per cell 1.85-2.59.  Her subsequent history is as detailed below   PAST MEDICAL HISTORY: Past Medical History:  Diagnosis Date   Breast cancer    Cancer 02/2016   right breast   DIABETES MELLITUS, TYPE II 01/04/2007   only takes actoplus daily   Dizziness and giddiness 02/29/2008   DVT, HX OF    at age 8 in right buttocks  Dyspnea    due to lung cancer   Family history of breast cancer    GERD 01/04/2007   pt reports resolved    GLAUCOMA 07/30/2008   both eyes   History of blood transfusion    no abnormal  reaction   History of uterine cancer 2000   hysterectomy done    HYPERLIPIDEMIA 01/04/2007   taking Pravastatin daily   HYPERTENSION 01/04/2007   takes Lisinopril daily   Joint pain    Joint swelling    Leg cramps    LEG PAIN, LEFT 07/06/2007   NUMBNESS 07/30/2008   in fingers;pt states from [facility]   OSTEOARTHRITIS, HIP 09/25/2009   OTITIS MEDIA, ACUTE, BILATERAL 02/29/2008   Overweight(278.02) 01/04/2007   Peripheral vascular disease    Personal history of radiation therapy 2018   Pneumonia    PONV (postoperative nausea and vomiting)    SLEEP APNEA, OBSTRUCTIVE    doesn't use a cpap;study done about 48yrs ago   TRANSIENT ISCHEMIC ATTACK, HX OF 01/04/2007   Vision loss    left eye    PAST SURGICAL HISTORY: Past Surgical History:  Procedure Laterality Date   ABDOMINAL HYSTERECTOMY  2000   BREAST LUMPECTOMY Right 01/13/2017   x2   BREAST LUMPECTOMY WITH RADIOACTIVE SEED AND SENTINEL LYMPH NODE BIOPSY Right 01/13/2017   Procedure: RIGHT BREAST RADIOACTIVE SEED X'S 2 GUIDED LUMPECTOMY WITH RADIOACTIVE SEED TARGETED AXILLARYLYMPH NODE EXCISION AND RIGHT AXILLARY SENTINEL LYMPH NODE BIOPSY;  Surgeon: Ovidio Kin, MD;  Location: [facility] OR;  Service: General;  Laterality: Right;  2 SEEDS IN RIGHT BREAST 1 SEED IN RIGHT AXILLARY NODE   CHOLECYSTECTOMY     ENDOBRONCHIAL ULTRASOUND Bilateral 03/28/2016   Procedure: ENDOBRONCHIAL ULTRASOUND;  Surgeon: Leslye Peer, MD;  Location: [facility];  Service: Cardiopulmonary;  Laterality: Bilateral;   EYE SURGERY  13   shunt left and lazer eye surgery on right cataract and retenia tear with repair   growth removal  2004   from thumb   KNEE ARTHROSCOPY Right    mulitple eye surgeries     both eyes, cataracts with ioc done both eyes   OOPHORECTOMY     right lumpectomy with axillary node dissection Right 01/2017   TOTAL HIP ARTHROPLASTY  06/24/2011   Procedure: TOTAL HIP ARTHROPLASTY;  Surgeon: Nestor Lewandowsky;  Location: [facility] OR;  Service: Orthopedics;  Laterality: Right;   TOTAL HIP ARTHROPLASTY Left 11/12/2012   Dr  Turner Daniels   TOTAL HIP ARTHROPLASTY Left 11/12/2012   Procedure: TOTAL HIP ARTHROPLASTY;  Surgeon: Nestor Lewandowsky, MD;  Location: [facility] OR;  Service: Orthopedics;  Laterality: Left;  DEPUY PINNACLE    FAMILY HISTORY Family History  Problem Relation Age of Onset   Breast cancer Mother 85   Dementia Mother    Cancer Mother        Breast and lung cancer   Stroke Sister    Breast cancer Sister 43   Heart attack Maternal Aunt    Lung cancer Maternal Grandmother        non smoker   Glaucoma Maternal Grandfather    Anesthesia problems Neg Hx   The patient's father died at age 2, the patient's mother died at age 61. She had breast and lung cancers diagnosed shortly before her death. The patient had no brothers, 2 sisters. One sister was diagnosed with breast cancer at the age of 77.   GYNECOLOGIC HISTORY:  No LMP recorded. Patient has had a hysterectomy. Menarche age 75, first live  birth age 89, the patient is GX P1. She had a hysterectomy for endometrial cancer in the year 2000. She did not take hormone replacement. She did use oral contraceptives for more than 20 years remotely, with no complications.   SOCIAL HISTORY: (Updated July 2021). 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 She is home with her husband Maisie Fus. He is a retired Curator.Their son Leonette Most also lives in [place].  The patient has 3 grandchildren aged 17, 66 and 48    ADVANCED DIRECTIVES: In place   HEALTH MAINTENANCE: Social History   Tobacco Use   Smoking status: Never   Smokeless tobacco: Never  Vaping Use   Vaping Use: Never used  Substance Use Topics   Alcohol use: No   Drug use: No     Colonoscopy: Never  PAP: Status post hysterectomy  Bone density: Remote   Allergies  Allergen Reactions   Codeine Hives    Hycodan syrup   Fluorescein Nausea And Vomiting    ? IV dye for retina specialist   Lipitor [Atorvastatin Calcium]     Leg cramp    Oxycodone Nausea And Vomiting    Patient vomited for 3 days after taking   Sitagliptin Phosphate Nausea And Vomiting   Sulfa Drugs Cross Reactors Nausea And Vomiting    Current Outpatient Medications  Medication Sig Dispense Refill   acetaminophen (TYLENOL) 500 MG tablet Take 1,000 mg by mouth every 4 (four) hours as needed for moderate pain or fever.     aspirin EC 81 MG tablet Take 81 mg by mouth daily at 6 PM. 1700     Biotin 1 MG CAPS Take by mouth.     brimonidine (ALPHAGAN) 0.2 % ophthalmic solution INSTILL 1 DROP INTO EACH EYE THREE TIMES DAILY     cholecalciferol (VITAMIN D) 1000 units tablet Take 1,000 Units by mouth daily.     dorzolamide-timolol (COSOPT) 22.3-6.8 MG/ML ophthalmic solution Place 1 drop into both eyes 2 (two) times daily.     letrozole (FEMARA) 2.5 MG tablet Take 1 tablet (2.5 mg total) by mouth daily. 90 tablet 3   lisinopril (ZESTRIL) 5 MG tablet Take 1 tablet (5 mg total) by mouth daily. 90 tablet 3   lovastatin (MEVACOR) 20 MG tablet Take 1 tablet (20 mg total) by mouth at bedtime. 90 tablet 3   Netarsudil-Latanoprost (ROCKLATAN) 0.02-0.005 % SOLN Apply to eye.     palbociclib (IBRANCE) 75 MG tablet Take 1 tablet (75 mg total) by mouth daily. Take for 21 days on, 7 days off, repeat every 28 days. 21 tablet 4   pioglitazone-metformin (ACTOPLUS MET) 15-850 MG tablet Take 1 tablet by mouth daily. 90 tablet 3   prednisoLONE acetate (PRED FORTE) 1 % ophthalmic suspension SMARTSIG:1 In Eye(s) 6 Times Daily     No current facility-administered medications for this visit.     OBJECTIVE: white woman in no acute distress  There were no vitals filed for this visit.    Wt Readings from Last 3 Encounters:  08/31/22 222 lb (100.7 kg)  06/27/22 219 lb 8 oz (99.6 kg)  06/21/22 219 lb 9.6 oz (99.6 kg)   There is no height or weight on file to calculate BMI.    ECOG FS:1 - Symptomatic but completely ambulatory No change in physical exam since her last visit  here Physical Exam Constitutional:      Appearance: Normal appearance.  Cardiovascular:  Rate and Rhythm: Normal rate and regular rhythm.     Pulses: Normal pulses.     Heart sounds: Normal heart sounds.  Pulmonary:     Effort: Pulmonary effort is normal.     Breath sounds: Normal breath sounds.  Musculoskeletal:        General: Swelling (Baseline lower extremity swelling with some varicose veins) present. Normal range of motion.     Cervical back: Normal range of motion and neck supple. No rigidity.  Lymphadenopathy:     Cervical: No cervical adenopathy.  Skin:    General: Skin is warm and dry.  Neurological:     General: No focal deficit present.     Mental Status: She is alert.  Psychiatric:        Mood and Affect: Mood normal.      LAB RESULTS:  CMP     Component Value Date/Time   NA 138 09/14/2022 1228   NA 140 05/29/2017 1254   K 4.3 09/14/2022 1228   K 4.4 05/29/2017 1254   CL 106 09/14/2022 1228   CO2 26 09/14/2022 1228   CO2 25 05/29/2017 1254   GLUCOSE 170 (H) 09/14/2022 1228   GLUCOSE 154 (H) 05/29/2017 1254   BUN 22 09/14/2022 1228   BUN 11.5 05/29/2017 1254   CREATININE 0.77 09/14/2022 1228   CREATININE 0.8 05/29/2017 1254   CALCIUM 9.4 09/14/2022 1228   CALCIUM 9.3 05/29/2017 1254   PROT 7.2 09/14/2022 1228   PROT 7.0 05/29/2017 1254   ALBUMIN 4.3 09/14/2022 1228   ALBUMIN 4.1 05/29/2017 1254   AST 11 (L) 09/14/2022 1228   AST 13 05/29/2017 1254   ALT 11 09/14/2022 1228   ALT 14 05/29/2017 1254   ALKPHOS 61 09/14/2022 1228   ALKPHOS 66 05/29/2017 1254   BILITOT 0.5 09/14/2022 1228   BILITOT 0.50 05/29/2017 1254   GFRNONAA >60 09/14/2022 1228   GFRAA >60 03/10/2020 1138    INo results found for: "SPEP", "UPEP"  Lab Results  Component Value Date   WBC 2.5 (L) 09/14/2022   NEUTROABS 1.4 (L) 09/14/2022   HGB 12.4 09/14/2022   HCT 36.7 09/14/2022   MCV 96.1 09/14/2022   PLT 189 09/14/2022      Chemistry      Component Value  Date/Time   NA 138 09/14/2022 1228   NA 140 05/29/2017 1254   K 4.3 09/14/2022 1228   K 4.4 05/29/2017 1254   CL 106 09/14/2022 1228   CO2 26 09/14/2022 1228   CO2 25 05/29/2017 1254   BUN 22 09/14/2022 1228   BUN 11.5 05/29/2017 1254   CREATININE 0.77 09/14/2022 1228   CREATININE 0.8 05/29/2017 1254      Component Value Date/Time   CALCIUM 9.4 09/14/2022 1228   CALCIUM 9.3 05/29/2017 1254   ALKPHOS 61 09/14/2022 1228   ALKPHOS 66 05/29/2017 1254   AST 11 (L) 09/14/2022 1228   AST 13 05/29/2017 1254   ALT 11 09/14/2022 1228   ALT 14 05/29/2017 1254   BILITOT 0.5 09/14/2022 1228   BILITOT 0.50 05/29/2017 1254       No results found for: "LABCA2"  No components found for: "LABCA125"  No results for input(s): "INR" in the last 168 hours.  Urinalysis    Component Value Date/Time   COLORURINE YELLOW 08/18/2017 1322   APPEARANCEUR HAZY (A) 08/18/2017 1322   LABSPEC 1.010 08/18/2017 1322   PHURINE 7.0 08/18/2017 1322   GLUCOSEU NEGATIVE 08/18/2017 1322   GLUCOSEU NEGATIVE  10/30/2014 1513   HGBUR LARGE (A) 08/18/2017 1322   BILIRUBINUR NEGATIVE 08/18/2017 1322   KETONESUR NEGATIVE 08/18/2017 1322   PROTEINUR 30 (A) 08/18/2017 1322   UROBILINOGEN 0.2 10/30/2014 1513   NITRITE NEGATIVE 08/18/2017 1322   LEUKOCYTESUR LARGE (A) 08/18/2017 1322    STUDIES: CT CHEST ABDOMEN PELVIS W CONTRAST  Result Date: 09/15/2022 CLINICAL DATA:  Stage IV invasive breast carcinoma. Assess response to treatment. Chemotherapy in progress. Lung metastasis. * Tracking Code: BO * EXAM: CT CHEST, ABDOMEN, AND PELVIS WITH CONTRAST TECHNIQUE: Multidetector CT imaging of the chest, abdomen and pelvis was performed following the standard protocol during bolus administration of intravenous contrast. RADIATION DOSE REDUCTION: This exam was performed according to the departmental dose-optimization program which includes automated exposure control, adjustment of the mA and/or kV according to patient size  and/or use of iterative reconstruction technique. CONTRAST:  OMNIPAQUE IOHEXOL 300 MG/ML  SOLN COMPARISON:  None Available. FINDINGS: CT CHEST FINDINGS Cardiovascular: No significant vascular findings. Normal heart size. No pericardial effusion. Mediastinum/Nodes: No axillary or supraclavicular adenopathy. No mediastinal or hilar adenopathy. No pericardial fluid. Esophagus normal. Lungs/Pleura: Mild scarring at the RIGHT lung apex is unchanged. No suspicious pulmonary nodularity. Musculoskeletal: No aggressive osseous lesion. CT ABDOMEN AND PELVIS FINDINGS Hepatobiliary: Intrahepatic and extrahepatic biliary duct dilatation is unchanged from comparison exam. Patient status post cholecystectomy. No obstructing lesion identified. Pancreatic duct is normal caliber. No enhancing hepatic lesion. Pancreas: Pancreas is normal. No ductal dilatation. No pancreatic inflammation. Spleen: Normal spleen Adrenals/urinary tract: A small nodule of the LEFT adrenal gland measures 9 mm (image 54/2) not changed from comparison exam. Intermediate density cystic lesion of the RIGHT kidney measures 30 mm and is not changed from comparison exam in density. Lesion demonstrated benign cyst on comparison MRI. No follow-up recommended. Ureters and bladder normal. Stomach/Bowel: Stomach, small bowel, appendix, and cecum are normal. The colon and rectosigmoid colon are normal. Vascular/Lymphatic: Abdominal aorta is normal caliber with atherosclerotic calcification. There is no retroperitoneal or periportal lymphadenopathy. No pelvic lymphadenopathy. Reproductive: Post hysterectomy.  Adnexa unremarkable Other: No peritoneal metastasis. Small calcification scratch the small calcified peritoneal nodule along the descending colon is unchanged (image 78/2) Musculoskeletal: No aggressive osseous lesion. IMPRESSION: CHEST IMPRESSION: No evidence of pulmonary metastasis. No evidence of breast cancer recurrence or metastasis PELVIS IMPRESSION: 1.  No evidence of breast cancer recurrence or metastasis in the abdomen pelvis. 2. Stable biliary duct dilatation post cholecystectomy. 3. Stable LEFT adrenal nodule. 4.  Aortic Atherosclerosis (ICD10-I70.0). Electronically Signed   By: Genevive Bi M.D.   On: 09/15/2022 11:04     ELIGIBLE FOR AVAILABLE RESEARCH PROTOCOL: no  ASSESSMENT: 77 y.o. [place] woman with a remote history of early stage endometrial cancer, subsequently status post right breast upper outer quadrant biopsy 01/22/2016 for a clinically multifocal T2 N0, stage 2A invasive ductal carcinoma, grade 1, estrogen and progesterone receptor positive, HER-2 negative, with an MIB-1 between 10 and 15%.  (1) right axillary lymph node biopsy 03/02/2016 positive  (2) genetics testing 01/13/2016 through the Custom gene panel offered by [facility] found no deleterious mutations in  ATM, BARD1, BRCA1, BRCA2, BRIP1, CDH1, CHEK2, EPCAM, FANCC, MLH1, MSH2, MSH6, MUTYH, NBN, PALB2, PMS2, POLD1, PTEN, RAD51C, RAD51D, TP53, and XRCC2  METASTATIC DISEASE: OCT 2017 (3) CT scans of the chest abdomen and pelvis obtained 03/10/2016 are consistent with bilateral lung metastases and mediastinal and hilar nodal involvement, but no liver or bone spread  (a) bronchoscopic lymph node biopsy 2 (station 7, 13R) 03/28/2016  confirms metastatic adenocarcinoma, estrogen receptor positive, HER-2 not amplified  (b) baseline CA-27-29 on 04/18/2016 was 137.5.  (4) letrozole started 03/15/2016, palbociclib added 03/29/2016 at 125 mg/day, 21/7  (a) dose decreased to 100 mg per day, 21/7, beginning with February cycle  (b) palbociclib held 01/31/2017, with increasing symptoms  (c) palbociclib resumed October 2018 at 75 mg daily  (d) palbociclib dose reduced to 75 mg every other day February through April 2019  (e) palbociclib dose resumed at 75 mg daily as of 10/17/2017  (f) palbociclib dose decreased to 75 mg every other day beginning 07/31/2019  (g) palbociclib  resumed at 75 mg daily, 21 days on 7 off, as of 08/25/2019  (5) status post double right lumpectomies and right axillary lymph node sampling 01/13/2017 for 2 separate invasive ductal carcinoma lesions, pT1a and pT1b, N1a, with negative margins, both lesions being estrogen and progesterone receptor positive and HER-2 negative  (6) adjuvant radiation completed 07/05/2017 1. 50.4 Gy in 28 fractions to the right breast and supraclavicular region using whole-breast tangent fields. 2. Boost to the seroma delivered an additional 10 Gy in 5 fractions. The total dose was 60.4 Gy.  (7) restaging studies:  (a) CT scan of the chest and bone scan 06/23/2017 showed stable scattered very small lung nodules, no bone lesions  (b) CT of the chest 10/17/2017 showed no new or progressive metastatic disease in the chest. The small left lower lobe pulmonary nodule is stable  (c) PET scan on 03/02/2018: shows no findings for residual or recurrent right breast cancer  (d) chest CT scan stable, questionable right renal cyst noted  (e) chest CT 09/18/2020 shows no measurable disease  (f) to the CT scan 04/29/2021 shows no evidence of active disease; a 2 cm thyroid nodule was noted   (8) right upper pole renal lesion noted to be enlarging on CT scan 06/22/2017  (a) no uptake on PET scan obtained 03/02/2018  #9 Most recent imaging with no new suspicious mass or lymphadenopathy identified in the chest abdomen or pelvis.  Stable chronic pleural and parenchymal scarring densities in the right lung apex.  Stable chronic moderate to severe biliary ductal dilatation.  Stable chronic 11 mm left adrenal gland nodule.  No dedicated follow-up required   PLAN:  She continues on Ibrance 75 mg once daily 3 weeks on 1 week off and letrozole daily.   She has been doing imaging every 6 months and following up with Dr. Darnelle Catalan every 3 months.   She continues to do well. Denies any new concerns particlularly except for some questions  regarding imaging and the medical bills. PE stable. CBC, stable leukopenia and neutropenia. Mammogram in May, scheduled, no change in exam. She was very upset about her bill, I reassured her that the physician part of the bill has been paid by 100%. She may be getting a bill for the labs. She then said she didn't get it before and she has been having labs for long. So we discussed that she can always consider doing labs in OSH facility. She chose [facility] in [address] We can fax the labs there. She may want to do it a day ahead so we have results. RTC in 3 months or sooner as needed  Total time spent: 30 min  Thank you for consulting [place] in the care of this patient.  Please not hesitate contact us with any additional questions or concerns.  *Total Encounter Time as defined by the Centers for Medicare and  Medicaid Services includes, in addition to the face-to-face time of a patient visit (documented in the note above) non-face-to-face time: obtaining and reviewing outside history, ordering and reviewing medications, tests or procedures, care coordination (communications with other health care professionals or caregivers) and documentation in the medical record.  Rachel MouldsPraveena Jessicaann Overbaugh MD

## 2022-09-21 ENCOUNTER — Telehealth: Payer: Self-pay | Admitting: Hematology and Oncology

## 2022-09-21 DIAGNOSIS — H2102 Hyphema, left eye: Secondary | ICD-10-CM | POA: Diagnosis not present

## 2022-09-21 DIAGNOSIS — H20013 Primary iridocyclitis, bilateral: Secondary | ICD-10-CM | POA: Diagnosis not present

## 2022-09-21 DIAGNOSIS — H353132 Nonexudative age-related macular degeneration, bilateral, intermediate dry stage: Secondary | ICD-10-CM | POA: Diagnosis not present

## 2022-09-21 DIAGNOSIS — H43813 Vitreous degeneration, bilateral: Secondary | ICD-10-CM | POA: Diagnosis not present

## 2022-09-21 DIAGNOSIS — H35351 Cystoid macular degeneration, right eye: Secondary | ICD-10-CM | POA: Diagnosis not present

## 2022-09-21 DIAGNOSIS — E119 Type 2 diabetes mellitus without complications: Secondary | ICD-10-CM | POA: Diagnosis not present

## 2022-09-21 NOTE — Telephone Encounter (Signed)
Spoke with patient confirming upcoming appointments  

## 2022-10-10 DIAGNOSIS — H209 Unspecified iridocyclitis: Secondary | ICD-10-CM | POA: Diagnosis not present

## 2022-10-10 DIAGNOSIS — H401134 Primary open-angle glaucoma, bilateral, indeterminate stage: Secondary | ICD-10-CM | POA: Diagnosis not present

## 2022-10-10 DIAGNOSIS — E1169 Type 2 diabetes mellitus with other specified complication: Secondary | ICD-10-CM | POA: Diagnosis not present

## 2022-10-10 DIAGNOSIS — Z961 Presence of intraocular lens: Secondary | ICD-10-CM | POA: Diagnosis not present

## 2022-10-24 ENCOUNTER — Ambulatory Visit
Admission: RE | Admit: 2022-10-24 | Discharge: 2022-10-24 | Disposition: A | Discharge status: Home / Self Care | Payer: PPO | Source: Ambulatory Visit | Attending: Hematology and Oncology | Admitting: Hematology and Oncology

## 2022-10-24 DIAGNOSIS — Z1231 Encounter for screening mammogram for malignant neoplasm of breast: Secondary | ICD-10-CM | POA: Diagnosis not present

## 2022-10-24 DIAGNOSIS — Z170 Estrogen receptor positive status [ER+]: Secondary | ICD-10-CM

## 2022-11-08 ENCOUNTER — Other Ambulatory Visit: Payer: Self-pay | Admitting: *Deleted

## 2022-11-08 MED ORDER — PALBOCICLIB 75 MG PO TABS: 75.0000 mg | ORAL_TABLET | Freq: Every day | ORAL | 4 refills | Status: DC

## 2022-11-16 ENCOUNTER — Telehealth: Payer: Self-pay | Admitting: Internal Medicine

## 2022-11-16 ENCOUNTER — Other Ambulatory Visit: Payer: PPO

## 2022-11-16 DIAGNOSIS — E1169 Type 2 diabetes mellitus with other specified complication: Secondary | ICD-10-CM

## 2022-11-16 NOTE — Telephone Encounter (Signed)
Patient states that she went to have her A1c done downstairs today and was told that that is done in the office.  Patient wants to know if this is something that she needs to come back to the office for or if it is included in her other blood work.  Please call patient at:  [phone]

## 2022-11-17 ENCOUNTER — Ambulatory Visit
Admission: EM | Admit: 2022-11-17 | Discharge: 2022-11-17 | Disposition: A | Discharge status: Home / Self Care | Payer: PPO | Attending: Physician Assistant | Admitting: Physician Assistant

## 2022-11-17 DIAGNOSIS — M5441 Lumbago with sciatica, right side: Secondary | ICD-10-CM

## 2022-11-17 MED ORDER — BACLOFEN 5 MG PO TABS: 2.5000 mg | ORAL_TABLET | Freq: Two times a day (BID) | ORAL | 0 refills | Status: DC | PRN

## 2022-11-17 NOTE — ED Triage Notes (Signed)
Per pt, her lower back is painful on the left side x 3 weeks. Took a topical gel for pain and tylenol which gave some relief.

## 2022-11-17 NOTE — Discharge Instructions (Addendum)
Take baclofen up to twice a day but I recommend taking it primarily at night as will cause drowsiness.  Do not drink alcohol with taking it.  I would start with half a tablet and if this is not effective you can increase to a full tablet.  Continue Tylenol and topical medications.  Follow-up with your PCP to consider additional intervention.  If anything worsens you have increasing pain, lower extremity weakness, numbness on the inside of your legs, going to the bathroom on yourself without noticing it you need to be seen immediately.

## 2022-11-17 NOTE — ED Provider Notes (Signed)
[facility]    CSN: [id number] Arrival date & time: 11/17/22  1034      History   Chief Complaint No chief complaint on file.   HPI [name] is a 77 y.o. female.   Patient presents today with 3 week history of right-sided lower back pain.  She denies any known injury increase activity prior to symptom onset.  She has had similar episodes a few years ago that resolved with medication.  She does have a history of lung and breast cancer.  She reports that pain is currently rated 3/4 on a 0 10 pain scale but increases with movement, described as aching, no aggravating leaving factors notified.  She has tried topical Voltaren and Tylenol without improvement of symptoms.  She denies any associated saddle anesthesia or increasing bowel/bladder incontinence.  Denies any numbness or paresthesias in her extremity.  Does report she generally has 1 episode of bladder incontinence when she first wakes up in the morning but this has been ongoing for a long time and is at her baseline.  Denies previous injury or surgery involving her back.  She has been using heat and massage to help her get to sleep but still wakes up because of the pain.    Past Medical History:  Diagnosis Date   Breast cancer (HCC)    Cancer (HCC) 02/2016   right breast   DIABETES MELLITUS, TYPE II 01/04/2007   only takes actoplus daily   Dizziness and giddiness 02/29/2008   DVT, HX OF    at age 89 in right buttocks   Dyspnea    due to lung cancer   Family history of breast cancer    GERD 01/04/2007   pt reports resolved    GLAUCOMA 07/30/2008   both eyes   History of blood transfusion    no abnormal  reaction   History of uterine cancer 2000   hysterectomy done   HYPERLIPIDEMIA 01/04/2007   taking Pravastatin daily   HYPERTENSION 01/04/2007   takes Lisinopril daily   Joint pain    Joint swelling    Leg cramps    LEG PAIN, LEFT 07/06/2007   NUMBNESS 07/30/2008   in fingers;pt states from Diamox    OSTEOARTHRITIS, HIP 09/25/2009   OTITIS MEDIA, ACUTE, BILATERAL 02/29/2008   Overweight(278.02) 01/04/2007   Peripheral vascular disease (HCC)    Personal history of radiation therapy 2018   Pneumonia    PONV (postoperative nausea and vomiting)    SLEEP APNEA, OBSTRUCTIVE    doesn't use a cpap;study done about 25yrs ago   TRANSIENT ISCHEMIC ATTACK, HX OF 01/04/2007   Vision loss    left eye    Patient Active Problem List   Diagnosis Date Noted   Peripheral edema 01/19/2019   Renal mass, right 07/03/2017   Aortic atherosclerosis (HCC) 06/29/2017   Goals of care, counseling/discussion 05/29/2017   Chemotherapy-induced neuropathy (HCC) 11/12/2016   Hepatic steatosis 04/18/2016   Mediastinal lymphadenopathy    Pulmonary nodules 03/22/2016   Lung metastases 03/15/2016   Genetic testing 02/16/2016   Morbid obesity (HCC) 02/03/2016   History of uterine cancer    Malignant neoplasm of upper-outer quadrant of right breast in female, estrogen receptor positive (HCC) 01/26/2016   Retinal vein occlusion, branch, left 05/02/2015   Osteoarthritis of left hip 11/12/2012   Degenerative arthritis of hip 05/28/2012   Preventative health care 10/22/2010   OSTEOARTHRITIS, HIP 09/25/2009   GLAUCOMA 07/30/2008   DVT, HX OF 07/06/2007  Diabetes (HCC) 01/04/2007   Hyperlipidemia associated with type 2 diabetes mellitus (HCC) 01/04/2007   Depression with anxiety 01/04/2007   Obstructive sleep apnea 01/04/2007   Essential hypertension 01/04/2007   Asthma 01/04/2007   GERD 01/04/2007    Past Surgical History:  Procedure Laterality Date   ABDOMINAL HYSTERECTOMY  2000   BREAST LUMPECTOMY Right 01/13/2017   x2   BREAST LUMPECTOMY WITH RADIOACTIVE SEED AND SENTINEL LYMPH NODE BIOPSY Right 01/13/2017   Procedure: RIGHT BREAST RADIOACTIVE SEED X'S 2 GUIDED LUMPECTOMY WITH RADIOACTIVE SEED TARGETED AXILLARYLYMPH NODE EXCISION AND RIGHT AXILLARY SENTINEL LYMPH NODE BIOPSY;  Surgeon: Ovidio Kin, MD;   Location: [facility] OR;  Service: General;  Laterality: Right;  2 SEEDS IN RIGHT BREAST 1 SEED IN RIGHT AXILLARY NODE   CHOLECYSTECTOMY     ENDOBRONCHIAL ULTRASOUND Bilateral 03/28/2016   Procedure: ENDOBRONCHIAL ULTRASOUND;  Surgeon: Leslye Peer, MD;  Location: [facility];  Service: Cardiopulmonary;  Laterality: Bilateral;   EYE SURGERY  13   shunt left and lazer eye surgery on right cataract and retenia tear with repair   growth removal  2004   from thumb   KNEE ARTHROSCOPY Right    mulitple eye surgeries     both eyes, cataracts with ioc done both eyes   OOPHORECTOMY     right lumpectomy with axillary node dissection Right 01/2017   TOTAL HIP ARTHROPLASTY  06/24/2011   Procedure: TOTAL HIP ARTHROPLASTY;  Surgeon: Nestor Lewandowsky;  Location: [facility] OR;  Service: Orthopedics;  Laterality: Right;   TOTAL HIP ARTHROPLASTY Left 11/12/2012   Dr Turner Daniels   TOTAL HIP ARTHROPLASTY Left 11/12/2012   Procedure: TOTAL HIP ARTHROPLASTY;  Surgeon: Nestor Lewandowsky, MD;  Location: [facility] OR;  Service: Orthopedics;  Laterality: Left;  DEPUY PINNACLE    OB History   No obstetric history on file.      Home Medications    Prior to Admission medications   Medication Sig Start Date End Date Taking? Authorizing Provider  Baclofen 5 MG TABS Take 0.5-1 tablets (2.5-5 mg total) by mouth 2 (two) times daily as needed. 11/17/22  Yes Coleta Grosshans, Noberto Retort, PA-C  acetaminophen (TYLENOL) 500 MG tablet Take 1,000 mg by mouth every 4 (four) hours as needed for moderate pain or fever.    [provider]  aspirin EC 81 MG tablet Take 81 mg by mouth daily at 6 PM. 1700    [provider]  Biotin 1 MG CAPS Take by mouth.    [provider]  brimonidine (ALPHAGAN) 0.2 % ophthalmic solution INSTILL 1 DROP INTO EACH EYE THREE TIMES DAILY 11/23/20   [provider]  cholecalciferol (VITAMIN D) 1000 units tablet Take 1,000 Units by mouth daily.    [provider]  dorzolamide-timolol (COSOPT) 22.3-6.8  MG/ML ophthalmic solution Place 1 drop into both eyes 2 (two) times daily. 12/20/16   [provider]  letrozole (FEMARA) 2.5 MG tablet Take 1 tablet (2.5 mg total) by mouth daily. 04/22/22   Rachel Moulds, MD  lisinopril (ZESTRIL) 5 MG tablet Take 1 tablet (5 mg total) by mouth daily. 08/31/22   Myrlene Broker, MD  lovastatin (MEVACOR) 20 MG tablet Take 1 tablet (20 mg total) by mouth at bedtime. 08/31/22   Myrlene Broker, MD  Netarsudil-Latanoprost [facility]) 0.02-0.005 % SOLN Apply to eye. 06/30/20   Magrinat, Valentino Hue, MD  palbociclib [name]) 75 MG tablet Take 1 tablet (75 mg total) by mouth daily. Take for 21 days on, 7 days  off, repeat every 28 days. 11/08/22   Rachel Moulds, MD  pioglitazone-metformin (ACTOPLUS MET) 15-850 MG tablet Take 1 tablet by mouth daily. 06/27/22   Myrlene Broker, MD  prednisoLONE acetate (PRED FORTE) 1 % ophthalmic suspension SMARTSIG:1 In Eye(s) 6 Times Daily 01/16/20   [provider]    Family History Family History  Problem Relation Age of Onset   Breast cancer Mother 28   Dementia Mother    Cancer Mother        Breast and lung cancer   Stroke Sister    Breast cancer Sister 27   Heart attack Maternal Aunt    Lung cancer Maternal Grandmother        non smoker   Glaucoma Maternal Grandfather    Anesthesia problems Neg Hx     Social History Social History   Tobacco Use   Smoking status: Never   Smokeless tobacco: Never  Vaping Use   Vaping Use: Never used  Substance Use Topics   Alcohol use: No   Drug use: No     Allergies   Codeine, Fluorescein, Lipitor [atorvastatin calcium], Oxycodone, Sitagliptin phosphate, and Sulfa drugs cross reactors   Review of Systems Review of Systems  Constitutional:  Positive for activity change. Negative for appetite change, fatigue and fever.  Gastrointestinal:  Negative for abdominal pain, diarrhea, nausea and vomiting.  Genitourinary:  Negative for dysuria,  frequency and urgency.  Musculoskeletal:  Positive for back pain. Negative for arthralgias and myalgias.  Neurological:  Negative for weakness and numbness.     Physical Exam Triage Vital Signs ED Triage Vitals [11/17/22 1210]  Enc Vitals Group     BP (!) 160/70     Pulse Rate 83     Resp 18     Temp 97.9 F (36.6 C)     Temp Source Oral     SpO2 96 %     Weight      Height      Head Circumference      Peak Flow      Pain Score 5     Pain Loc      Pain Edu?      Excl. in GC?    No data found.  Updated Vital Signs BP (!) 160/70 (BP Location: Left Arm)   Pulse 83   Temp 97.9 F (36.6 C) (Oral)   Resp 18   SpO2 96%   Visual Acuity Right Eye Distance:   Left Eye Distance:   Bilateral Distance:    Right Eye Near:   Left Eye Near:    Bilateral Near:     Physical Exam Vitals reviewed.  Constitutional:      General: She is awake. She is not in acute distress.    Appearance: Normal appearance. She is well-developed. She is not ill-appearing.     Comments: Very pleasant female appears stated age in no acute distress sitting comfortably in exam room  HENT:     Head: Normocephalic and atraumatic.  Cardiovascular:     Rate and Rhythm: Normal rate and regular rhythm.     Heart sounds: Normal heart sounds, S1 normal and S2 normal. No murmur heard. Pulmonary:     Effort: Pulmonary effort is normal.     Breath sounds: Normal breath sounds. No wheezing, rhonchi or rales.     Comments: Clear to auscultation bilaterally Abdominal:     Palpations: Abdomen is soft.     Tenderness: There is no abdominal tenderness.  Musculoskeletal:     Cervical back: No tenderness or bony tenderness.     Thoracic back: No tenderness or bony tenderness.     Lumbar back: Tenderness present. No spasms or bony tenderness. Negative right straight leg raise test and negative left straight leg raise test.     Comments: Back: No pain percussion of vertebrae.  Mild tenderness palpation over right  lumbar/buttocks muscles without spasm or deformity.  Strength 5/5 bilateral lower extremities.  Improvement of symptoms with forward flexion and worsening with extension, normal rotation and lateral flexion.  Psychiatric:        Behavior: Behavior is cooperative.      UC Treatments / Results  Labs (all labs ordered are listed, but only abnormal results are displayed) Labs Reviewed - No data to display  EKG   Radiology No results found.  Procedures Procedures (including critical care time)  Medications Ordered in UC Medications - No data to display  Initial Impression / Assessment and Plan / UC Course  I have reviewed the triage vital signs and the nursing notes.  Pertinent labs & imaging results that were available during my care of the patient were reviewed by me and considered in my medical decision making (see chart for details).     Patient is mildly hypertensive but otherwise well-appearing, afebrile, nontoxic, nontachycardic.  Unfortunately, we do not have an x-ray tech today so offered outpatient imaging of lumbar spine given history of malignancy but she declined this and will follow-up with her primary care to determine if additional testing is required.  No alarm symptoms that warrant emergent evaluation or imaging.  Patient is unable to take prednisone and has already failed over-the-counter analgesics.  She was prescribed low-dose baclofen to help with her symptoms with instruction not to drive or drink alcohol with taking this medication as drowsiness is a common side effect.  Encouraged her to start on the low-dose (0.5 tablets or 2.5 mg initially and then can increase to 1 tablet or 5 mg based on tolerance).  Encouraged her to continue conservative treatment measures including heat and rest.  Recommended that she follow-up with her primary care for reevaluation to consider additional imaging and/or referral to physical therapy that we do not have access to an urgent  care.  Offered lidocaine patch but patient reports he is unable to take lidocaine because of her history of glaucoma.  I was unable to find this contraindication for topical lidocaine but encouraged her to follow-up with her ophthalmologist and discuss if this is a potential treatment option if her symptoms are not improving with current treatment plan.  If she has any worsening or changing symptoms she is to return for reevaluation.  Strict return precautions given.  Final Clinical Impressions(s) / UC Diagnoses   Final diagnoses:  Acute right-sided low back pain with right-sided sciatica     Discharge Instructions      Take baclofen up to twice a day but I recommend taking it primarily at night as will cause drowsiness.  Do not drink alcohol with taking it.  I would start with half a tablet and if this is not effective you can increase to a full tablet.  Continue Tylenol and topical medications.  Follow-up with your PCP to consider additional intervention.  If anything worsens you have increasing pain, lower extremity weakness, numbness on the inside of your legs, going to the bathroom on yourself without noticing it you need to be seen immediately.     ED  Prescriptions     Medication Sig Dispense Auth. Provider   Baclofen 5 MG TABS Take 0.5-1 tablets (2.5-5 mg total) by mouth 2 (two) times daily as needed. 10 tablet Skip Litke, Noberto Retort, PA-C      PDMP not reviewed this encounter.   Jeani Hawking, PA-C 11/17/22 1248

## 2022-11-17 NOTE — Telephone Encounter (Signed)
Spoke with patient and she stated that she remember you telling her about getting her A1C checked but when she went downstairs for lab they told her that they don't do the" prick" that is done in office and she also did not get her blood drawn either. She is wanting know does she need to get this done? because she also get this done at her cancer center and she pays 120$ every time.

## 2022-11-17 NOTE — Telephone Encounter (Signed)
Her labs needed were entered future at last visit. Please re-enter these for future so she can come get them done.

## 2022-11-21 ENCOUNTER — Other Ambulatory Visit: Payer: Self-pay

## 2022-11-21 DIAGNOSIS — E1169 Type 2 diabetes mellitus with other specified complication: Secondary | ICD-10-CM

## 2022-11-21 DIAGNOSIS — E1165 Type 2 diabetes mellitus with hyperglycemia: Secondary | ICD-10-CM

## 2022-11-21 NOTE — Telephone Encounter (Signed)
Pt does not want to do the lipid panel and asked why can't I do her A1C in office?  she states that she will do will be doing the  Microalbumin creatinine and that's it.

## 2022-11-21 NOTE — Telephone Encounter (Signed)
I have placed lab orders for patient to have done.

## 2022-11-21 NOTE — Telephone Encounter (Signed)
I am following standards of care so if she does not want to follow standards of care that is up to her.

## 2022-11-22 ENCOUNTER — Telehealth: Payer: Self-pay

## 2022-11-22 ENCOUNTER — Other Ambulatory Visit: Payer: Self-pay

## 2022-11-22 ENCOUNTER — Other Ambulatory Visit (INDEPENDENT_AMBULATORY_CARE_PROVIDER_SITE_OTHER): Payer: PPO

## 2022-11-22 DIAGNOSIS — E785 Hyperlipidemia, unspecified: Secondary | ICD-10-CM

## 2022-11-22 DIAGNOSIS — C50411 Malignant neoplasm of upper-outer quadrant of right female breast: Secondary | ICD-10-CM

## 2022-11-22 DIAGNOSIS — C7800 Secondary malignant neoplasm of unspecified lung: Secondary | ICD-10-CM

## 2022-11-22 DIAGNOSIS — E1169 Type 2 diabetes mellitus with other specified complication: Secondary | ICD-10-CM | POA: Diagnosis not present

## 2022-11-22 DIAGNOSIS — E1165 Type 2 diabetes mellitus with hyperglycemia: Secondary | ICD-10-CM | POA: Diagnosis not present

## 2022-11-22 LAB — LIPID PANEL
Cholesterol: 202 mg/dL — ABNORMAL HIGH (ref 0–200)
HDL: 56.1 mg/dL (ref >39.00)
NonHDL: 146.33
Total CHOL/HDL Ratio: 4
Triglycerides: 220 mg/dL — ABNORMAL HIGH (ref 0.0–149.0)
VLDL: 44 mg/dL — ABNORMAL HIGH (ref 0.0–40.0)

## 2022-11-22 LAB — LDL CHOLESTEROL, DIRECT: Direct LDL: 133 mg/dL

## 2022-11-22 LAB — MICROALBUMIN / CREATININE URINE RATIO
Creatinine,U: 44.9 mg/dL
Microalb Creat Ratio: 1.6 mg/g (ref 0.0–30.0)
Microalb, Ur: <0.7 mg/dL (ref 0.0–1.9)

## 2022-11-22 LAB — HEMOGLOBIN A1C: Hgb A1c MFr Bld: 7.4 % — ABNORMAL HIGH (ref 4.6–6.5)

## 2022-11-22 NOTE — Telephone Encounter (Signed)
Pt wanted to know will she be charged for the lab work

## 2022-11-22 NOTE — Progress Notes (Signed)
Lab orders faxed to [facility] [phone]. Fax confirmation received.

## 2022-11-22 NOTE — Telephone Encounter (Signed)
Pt called, disgruntled about going to [facility] to have labs drawn, but no orders were there upon arrival. She is also upset about not having a 6 mo supply of Ibrance.  Orders placed per MD and faxed to [facility] at [phone]. Fax confirmation received.

## 2022-11-23 NOTE — Telephone Encounter (Signed)
Ok noted  

## 2022-11-23 NOTE — Telephone Encounter (Signed)
She may be based on her health plan she may be responsible for a portion of the bill. She can contact her health plan to find out more.

## 2022-11-25 ENCOUNTER — Ambulatory Visit (INDEPENDENT_AMBULATORY_CARE_PROVIDER_SITE_OTHER): Payer: PPO | Admitting: Internal Medicine

## 2022-11-25 ENCOUNTER — Encounter: Payer: Self-pay | Admitting: Internal Medicine

## 2022-11-25 ENCOUNTER — Ambulatory Visit (INDEPENDENT_AMBULATORY_CARE_PROVIDER_SITE_OTHER): Payer: PPO

## 2022-11-25 ENCOUNTER — Other Ambulatory Visit: Payer: Self-pay | Admitting: Internal Medicine

## 2022-11-25 VITALS — BP 140/68 | HR 66 | Temp 98.3°F | Ht 65.0 in | Wt 226.0 lb

## 2022-11-25 DIAGNOSIS — N3946 Mixed incontinence: Secondary | ICD-10-CM | POA: Insufficient documentation

## 2022-11-25 DIAGNOSIS — Z7984 Long term (current) use of oral hypoglycemic drugs: Secondary | ICD-10-CM | POA: Diagnosis not present

## 2022-11-25 DIAGNOSIS — E1169 Type 2 diabetes mellitus with other specified complication: Secondary | ICD-10-CM | POA: Diagnosis not present

## 2022-11-25 DIAGNOSIS — M5441 Lumbago with sciatica, right side: Secondary | ICD-10-CM | POA: Diagnosis not present

## 2022-11-25 DIAGNOSIS — E785 Hyperlipidemia, unspecified: Secondary | ICD-10-CM | POA: Diagnosis not present

## 2022-11-25 DIAGNOSIS — M545 Low back pain, unspecified: Secondary | ICD-10-CM | POA: Diagnosis not present

## 2022-11-25 LAB — URINALYSIS, ROUTINE W REFLEX MICROSCOPIC
Bilirubin Urine: NEGATIVE
Hgb urine dipstick: NEGATIVE
Ketones, ur: NEGATIVE
Nitrite: POSITIVE — AB
RBC / HPF: NONE SEEN (ref 0–?)
Specific Gravity, Urine: 1.025 (ref 1.000–1.030)
Total Protein, Urine: NEGATIVE
Urine Glucose: NEGATIVE
Urobilinogen, UA: 0.2 (ref 0.0–1.0)
pH: 6 (ref 5.0–8.0)

## 2022-11-25 MED ORDER — NITROFURANTOIN MONOHYD MACRO 100 MG PO CAPS: 100.0000 mg | ORAL_CAPSULE | Freq: Two times a day (BID) | ORAL | 0 refills | Status: AC

## 2022-11-25 NOTE — Progress Notes (Signed)
   Subjective:   Patient ID: [name], female    DOB: 03-Feb-1946, 77 y.o.   MRN: [id number]  HPI The patient is a 77 YO female coming in for several concerns including low back pain (went to ER, hard to pin down timeline but weeks worse than usual maybe years altogether) and bladder issues (incontinence with standing especially first thing in the morning weeks, no pain or burning) and weight gain (since she has back pain and moving less she has gained back a few pounds).   Review of Systems  Constitutional:  Positive for activity change and unexpected weight change. Negative for appetite change, chills, fatigue and fever.  HENT: Negative.    Eyes: Negative.   Respiratory: Negative.  Negative for cough, chest tightness and shortness of breath.   Cardiovascular: Negative.  Negative for chest pain, palpitations and leg swelling.  Gastrointestinal: Negative.  Negative for abdominal distention, abdominal pain, constipation, diarrhea, nausea and vomiting.  Genitourinary:        Incontinence  Musculoskeletal:  Positive for arthralgias, back pain and myalgias. Negative for gait problem and joint swelling.  Skin: Negative.   Neurological: Negative.   Psychiatric/Behavioral: Negative.      Objective:  Physical Exam Constitutional:      Appearance: She is well-developed.  HENT:     Head: Normocephalic and atraumatic.  Cardiovascular:     Rate and Rhythm: Normal rate and regular rhythm.  Pulmonary:     Effort: Pulmonary effort is normal. No respiratory distress.     Breath sounds: Normal breath sounds. No wheezing or rales.  Abdominal:     General: Bowel sounds are normal. There is no distension.     Palpations: Abdomen is soft.     Tenderness: There is no abdominal tenderness. There is no rebound.  Musculoskeletal:        General: Tenderness present.     Cervical back: Normal range of motion.     Comments: Midline lumbar tenderness with radiation to the right side  Skin:    General:  Skin is warm and dry.  Neurological:     Mental Status: She is alert and oriented to person, place, and time.     Coordination: Coordination normal.     Vitals:   11/25/22 0916 11/25/22 0919  BP: (!) 140/68 (!) 140/68  Pulse: 66   Temp: 98.3 F (36.8 C)   TempSrc: Oral   SpO2: 95%   Weight: 226 lb (102.5 kg)   Height: 5\' 5"  (1.651 m)     Assessment & Plan:

## 2022-11-25 NOTE — Patient Instructions (Addendum)
Go back to taking the lovastatin every day.  We will do the urine and the x-ray today.

## 2022-11-25 NOTE — Assessment & Plan Note (Signed)
We did discuss checking for infection with U/A. Recent microalbumin to creatinine ratio would not check for that. Asked her to consider kegel exercises. Suspect abdominal weight is contributing to symptoms and we discussed bladder and pelvic floor muscle laxity with aging. Offered medication but we prefer to do U/A and kegel first. If infection treat appropriately.

## 2022-11-25 NOTE — Assessment & Plan Note (Signed)
She has stalled on weight loss progress likely due to lack of activity. She stopped walking due to new/worsening back pain. Continued diet.

## 2022-11-25 NOTE — Assessment & Plan Note (Signed)
She states she was instructed by prior provider to take lovastatin every other day. Recent lipid panel was higher than goal. I have asked her to resume daily lovastatin 20 mg daily and recheck 3-6 months.

## 2022-11-25 NOTE — Assessment & Plan Note (Signed)
Suspect this is arthritis based on symptoms. Timeline is vague but perhaps weeks of less mobility due to pain. Hurts with prolonged sitting. For some time (maybe years) she was able to walk and this would clear now it does not clear. Went to ER recently and they did not assess and she felt provider did not listen to her. She is concerned about kidneys and explanation given for why this was not checked with recent labs.

## 2022-11-28 ENCOUNTER — Telehealth: Payer: Self-pay | Admitting: Internal Medicine

## 2022-11-28 NOTE — Telephone Encounter (Signed)
Called pt inform xray results are still not back. Will give her a call once MD received report.Marland KitchenRaechel Chute

## 2022-11-28 NOTE — Telephone Encounter (Signed)
Pt called wanting a call back for her results on her back from xray.

## 2022-12-01 DIAGNOSIS — Z170 Estrogen receptor positive status [ER+]: Secondary | ICD-10-CM | POA: Diagnosis not present

## 2022-12-01 DIAGNOSIS — C7800 Secondary malignant neoplasm of unspecified lung: Secondary | ICD-10-CM | POA: Diagnosis not present

## 2022-12-07 DIAGNOSIS — H35351 Cystoid macular degeneration, right eye: Secondary | ICD-10-CM | POA: Diagnosis not present

## 2022-12-07 DIAGNOSIS — H20013 Primary iridocyclitis, bilateral: Secondary | ICD-10-CM | POA: Diagnosis not present

## 2022-12-08 ENCOUNTER — Telehealth: Payer: Self-pay | Admitting: *Deleted

## 2022-12-13 ENCOUNTER — Ambulatory Visit: Payer: PPO | Admitting: Internal Medicine

## 2022-12-13 ENCOUNTER — Encounter: Payer: Self-pay | Admitting: Internal Medicine

## 2022-12-13 VITALS — BP 128/84 | HR 80 | Temp 98.4°F | Ht 65.0 in | Wt 227.0 lb

## 2022-12-13 DIAGNOSIS — M5416 Radiculopathy, lumbar region: Secondary | ICD-10-CM

## 2022-12-13 MED ORDER — MELOXICAM 15 MG PO TABS: 15.0000 mg | ORAL_TABLET | Freq: Every day | ORAL | 0 refills | Status: DC

## 2022-12-13 MED ORDER — GABAPENTIN 100 MG PO CAPS: ORAL_CAPSULE | ORAL | 3 refills | Status: AC

## 2022-12-13 NOTE — Patient Instructions (Addendum)
Medications changes include :   meloxicam 15 mg daily with food.  Gabapentin at bedtime 200 mg at night - can increase to 300 mg at bedtime it tolerated.     Keep doing exercises.  Keep walking.    Return if symptoms worsen or fail to improve.    Sciatica Rehab Ask your health care provider which exercises are safe for you. Do exercises exactly as told by your health care provider and adjust them as directed. It is normal to feel mild stretching, pulling, tightness, or discomfort as you do these exercises. Stop right away if you feel sudden pain or your pain gets worse. Do not begin these exercises until told by your health care provider. Stretching and range-of-motion exercises These exercises warm up your muscles and joints and improve the movement and flexibility of your hips and back. These exercises also help to relieve pain, numbness, and tingling. Sciatic nerve glide  Sit in a chair with your head facing down toward your chest. Place your hands behind your back. Let your shoulders slump forward. Slowly straighten one of your legs while you tilt your head back as if you are looking toward the ceiling. Only straighten your leg as far as you can without making your symptoms worse. Hold this position for __________ seconds. Slowly return your leg and head back to the starting position. Repeat with your other leg. Repeat __________ times. Complete this exercise __________ times a day. Knee to chest with hip adduction and internal rotation  Lie on your back on a firm surface with both legs straight. Bend one of your knees and move it up toward your chest until you feel a gentle stretch in your lower back and buttock. Then, move your knee toward the shoulder that is on the opposite side from your leg. This is hip adduction and internal rotation. Hold your leg in this position by holding on to the front of your knee. Hold this position for __________ seconds. Slowly return to  the starting position. Repeat with your other leg. Repeat __________ times. Complete this exercise __________ times a day. Prone extension on elbows  Lie on your abdomen on a firm surface. A bed may be too soft for this exercise. Prop yourself up on your elbows. Use your arms to help lift your chest up until you feel a gentle stretch in your abdomen and your lower back. This will place some of your body weight on your elbows. If this is uncomfortable, try stacking pillows under your chest. Your hips should stay down, against the surface that you are lying on. Keep your hip and back muscles relaxed. Hold this position for __________ seconds. Slowly relax your upper body and return to the starting position. Repeat __________ times. Complete this exercise __________ times a day. Strengthening exercises These exercises build strength and endurance in your back. Endurance is the ability to use your muscles for a long time, even after they get tired. Pelvic tilt This exercise strengthens the muscles that lie deep in the abdomen. Lie on your back on a firm surface. Bend your knees and keep your feet flat on the surface. Tense your abdominal muscles. Tip your pelvis up toward the ceiling and flatten your lower back into the firm surface. To help with this exercise, you may place a small towel under your lower back and try to push your back into the towel. Hold this position for __________ seconds. Let your muscles relax completely before you  repeat this exercise. Repeat __________ times. Complete this exercise __________ times a day. Alternating arm and leg raises  Get on your hands and knees on a firm surface. If you are on a hard floor, you may want to use padding, such as an exercise mat, to cushion your knees. Line up your arms and legs. Your hands should be directly below your shoulders, and your knees should be directly below your hips. Lift your left leg behind you. At the same time, raise  your right arm and straighten it in front of you. Do not lift your leg higher than your hip. Do not lift your arm higher than your shoulder. Keep your abdominal and back muscles tight. Keep your hips facing the ground. Do not arch your back. Keep your balance carefully, and do not hold your breath. Hold this position for __________ seconds. Slowly return to the starting position. Repeat with your right leg and your left arm. Repeat __________ times. Complete this exercise __________ times a day. Posture and body mechanics Good posture and healthy body mechanics can help to relieve stress in your body's tissues and joints. Body mechanics refers to the movements and positions of your body while you do your daily activities. Posture is part of body mechanics. Good posture means: Your spine is in its natural S-curve position (neutral). Your shoulders are pulled back slightly. Your head is not tipped forward. Follow these guidelines to improve your posture and body mechanics in your everyday activities. Standing  When standing, keep your spine neutral and your feet about hip width apart. Keep a slight bend in your knees. Your ears, shoulders, and hips should line up. When you do a task in which you stand in one place for a long time, place one foot up on a stable object that is 2-4 inches (5-10 cm) high, such as a footstool. This helps keep your spine neutral. Sitting  When sitting, keep your spine neutral and keep your feet flat on the floor. Use a footrest, if necessary, and keep your thighs parallel to the floor. Avoid rounding your shoulders, and avoid tilting your head forward. When working at a desk or a computer, keep your desk at a height where your hands are slightly lower than your elbows. Slide your chair under your desk so you are close enough to maintain good posture. When working at a computer, place your monitor at a height where you are looking straight ahead and you do not have  to tilt your head forward or downward to look at the screen. Resting  When lying down and resting, avoid positions that are most painful for you. If you have pain with activities such as sitting, bending, stooping, or squatting, lie in a position in which your body does not bend very much. For example, avoid curling up on your side with your arms and knees near your chest (fetal position). If you have pain with activities such as standing for a long time or reaching with your arms, lie with your spine in a neutral position and bend your knees slightly. Try the following positions: Lying on your side with a pillow between your knees. Lying on your back with a pillow under your knees. Lifting  When lifting objects, keep your feet at least shoulder width apart and tighten your abdominal muscles. Bend your knees and hips and keep your spine neutral. It is important to lift using the strength of your legs, not your back. Do not lock your knees straight  out. Always ask for help to lift heavy or awkward objects. This information is not intended to replace advice given to you by your health care provider. Make sure you discuss any questions you have with your health care provider. Document Revised: 09/07/2021 Document Reviewed: 09/07/2021 Elsevier Patient Education  2024 [employer].

## 2022-12-13 NOTE — Progress Notes (Signed)
Subjective:    Patient ID: [name], female    DOB: 11-Dec-1945, 77 y.o.   MRN: [id number]      HPI Felisita is here for  Chief Complaint  Patient presents with   Back Pain    Right sided back pain; After kidney infection healed she started having back pain    Pain started about 5 weeks ago.  She had an xray at that time - there was some facet arthritis on xray.  The pain went down the right leg a couple of weeks after it started ( after she was seen).  It goes down to the knee.  No N/T.  The leg is a little weak.  No muscle spasms.   Standing and putting weight on the leg hurts the most -- after walking it eases off a little.  She has tried walking, doing back exercises - not really helping.  She has not taken any meds  Never had sciatica in the past.       Medications and allergies reviewed with patient and updated if appropriate.  Current Outpatient Medications on File Prior to Visit  Medication Sig Dispense Refill   acetaminophen (TYLENOL) 500 MG tablet Take 1,000 mg by mouth every 4 (four) hours as needed for moderate pain or fever.     aspirin EC 81 MG tablet Take 81 mg by mouth daily at 6 PM. 1700     Biotin 1 MG CAPS Take by mouth.     brimonidine (ALPHAGAN) 0.2 % ophthalmic solution INSTILL 1 DROP INTO EACH EYE THREE TIMES DAILY     cholecalciferol (VITAMIN D) 1000 units tablet Take 1,000 Units by mouth daily.     dorzolamide-timolol (COSOPT) 22.3-6.8 MG/ML ophthalmic solution Place 1 drop into both eyes 2 (two) times daily.     ketorolac (ACULAR) 0.5 % ophthalmic solution Place 1 drop into the right eye 2 (two) times daily.     letrozole (FEMARA) 2.5 MG tablet Take 1 tablet (2.5 mg total) by mouth daily. 90 tablet 3   lisinopril (ZESTRIL) 5 MG tablet Take 1 tablet (5 mg total) by mouth daily. 90 tablet 3   lovastatin (MEVACOR) 20 MG tablet Take 1 tablet (20 mg total) by mouth at bedtime. 90 tablet 3   Netarsudil-Latanoprost (ROCKLATAN) 0.02-0.005 % SOLN Apply to  eye.     palbociclib (IBRANCE) 75 MG tablet Take 1 tablet (75 mg total) by mouth daily. Take for 21 days on, 7 days off, repeat every 28 days. 21 tablet 4   pioglitazone-metformin (ACTOPLUS MET) 15-850 MG tablet Take 1 tablet by mouth daily. 90 tablet 3   prednisoLONE acetate (PRED FORTE) 1 % ophthalmic suspension SMARTSIG:1 In Eye(s) 6 Times Daily     No current facility-administered medications on file prior to visit.    Review of Systems     Objective:   Vitals:   12/13/22 1450  BP: 128/84  Pulse: 80  Temp: 98.4 F (36.9 C)  SpO2: 96%   BP Readings from Last 3 Encounters:  12/13/22 128/84  11/25/22 (!) 140/68  11/17/22 (!) 160/70   Wt Readings from Last 3 Encounters:  12/13/22 227 lb (103 kg)  11/25/22 226 lb (102.5 kg)  09/20/22 221 lb 12.8 oz (100.6 kg)   Body mass index is 37.77 kg/m.    Physical Exam Constitutional:      General: She is not in acute distress.    Appearance: Normal appearance. She is not ill-appearing.  HENT:  Head: Normocephalic and atraumatic.  Musculoskeletal:        General: Tenderness (right buttock region, no tenderness lower back) present.     Right lower leg: No edema.     Left lower leg: No edema.  Skin:    General: Skin is warm and dry.     Findings: No rash.  Neurological:     Mental Status: She is alert.     Sensory: No sensory deficit.     Motor: Weakness (mild right leg) present.     Comments: Positive R straight leg raise        DG Lumbar Spine Complete CLINICAL DATA:  RIGHT-sided LOWER back pain for months. No known injury.  EXAM: LUMBAR SPINE - COMPLETE 4+ VIEW  COMPARISON:  CT exam 09/14/2022  FINDINGS: There is normal alignment of the lumbar spine. No acute fracture or subluxation. There is facet hypertrophy primarily at L5-S1.  Bowel gas pattern is nonobstructive. There is atherosclerotic calcification of the abdominal aorta. Prior bilateral hip replacement. Surgical clips are present in the RIGHT  UPPER QUADRANT of the abdomen.  IMPRESSION: 1. No acute abnormality. 2. Facet hypertrophy at L5-S1. 3. Atherosclerotic calcification of the abdominal aorta.  Electronically Signed   By: Norva Pavlov M.D.   On: 12/01/2022 09:34       Assessment & Plan:    Lumbar radiculopathy, right: Acute Started last month xray done last month - reviewed Pain from R buttock to r knee, associated with some weakness in leg Has diabetes - so will try to avoid steroids Start meloxicam 15 mg daily with food Start gabapentin 200 mg HS - can increase to 300 mg if tolerated.  Discussed possible side effects Continue back exercises at home-can refer to PT if pain is not improving Increase walking If pain is not improving consider referral to sports medicine orthopedics

## 2022-12-13 NOTE — Telephone Encounter (Signed)
No entry 

## 2022-12-27 ENCOUNTER — Ambulatory Visit: Payer: PPO | Admitting: Hematology and Oncology

## 2022-12-30 ENCOUNTER — Encounter: Payer: Self-pay | Admitting: Hematology and Oncology

## 2023-01-06 ENCOUNTER — Other Ambulatory Visit: Payer: Self-pay

## 2023-01-06 ENCOUNTER — Inpatient Hospital Stay: Payer: PPO | Attending: Hematology and Oncology | Admitting: Hematology and Oncology

## 2023-01-06 VITALS — BP 159/65 | HR 87 | Temp 98.1°F | Resp 18 | Ht 65.0 in | Wt 231.0 lb

## 2023-01-06 DIAGNOSIS — C50411 Malignant neoplasm of upper-outer quadrant of right female breast: Secondary | ICD-10-CM | POA: Insufficient documentation

## 2023-01-06 DIAGNOSIS — Z801 Family history of malignant neoplasm of trachea, bronchus and lung: Secondary | ICD-10-CM | POA: Diagnosis not present

## 2023-01-06 DIAGNOSIS — Z803 Family history of malignant neoplasm of breast: Secondary | ICD-10-CM | POA: Diagnosis not present

## 2023-01-06 DIAGNOSIS — E278 Other specified disorders of adrenal gland: Secondary | ICD-10-CM | POA: Diagnosis not present

## 2023-01-06 DIAGNOSIS — Z79811 Long term (current) use of aromatase inhibitors: Secondary | ICD-10-CM | POA: Insufficient documentation

## 2023-01-06 DIAGNOSIS — Z8542 Personal history of malignant neoplasm of other parts of uterus: Secondary | ICD-10-CM | POA: Insufficient documentation

## 2023-01-06 DIAGNOSIS — Z170 Estrogen receptor positive status [ER+]: Secondary | ICD-10-CM | POA: Diagnosis not present

## 2023-01-06 DIAGNOSIS — M549 Dorsalgia, unspecified: Secondary | ICD-10-CM | POA: Diagnosis not present

## 2023-01-06 DIAGNOSIS — R918 Other nonspecific abnormal finding of lung field: Secondary | ICD-10-CM | POA: Insufficient documentation

## 2023-01-06 DIAGNOSIS — Z90710 Acquired absence of both cervix and uterus: Secondary | ICD-10-CM | POA: Insufficient documentation

## 2023-01-06 NOTE — Progress Notes (Signed)
[facility]  [phone] [phone]     ID: [name] DOB: Jul 29, 1945  MR#: [id number]  NGE#:[id number]  Patient Care Team: Myrlene Broker, MD as PCP - General (Internal Medicine) Ovidio Kin, MD as Consulting Physician (General Surgery) Magrinat, Valentino Hue, MD (Inactive) as Consulting Physician (Oncology) Dorothy Puffer, MD as Consulting Physician (Radiation Oncology) Carrington Clamp, MD as Consulting Physician (Obstetrics and Gynecology) Belva Chimes, MD as Referring Physician (Specialist) Leslye Peer, MD as Consulting Physician (Pulmonary Disease) Berneice Heinrich Delbert Phenix., MD as Consulting Physician (Urology) Loraine Grip, Harrietta Guardian, MD as Referring Physician (Ophthalmology) Anselm Lis, RPH-CPP (Pharmacist) Augustin Schooling, MD as Consulting Physician (Ophthalmology)  CHIEF COMPLAINT: Estrogen receptor positive breast cancer  CURRENT TREATMENT: Letrozole, palbociclib  INTERVAL HISTORY:  [name] returns today for follow-up of her estrogen receptor positive stage IV breast cancer.   She overall has been tolerating Ibrance and letrozole very well.   She complains of back pain, started about 6 weeks ago, no change, no improvement. She has been taking tylenol every 8 hrs, pain seems to be out of control when she doesn't take tylenol. She also has a shooting pain down that leg that goes to the knee. She denies any other health changes other than the back pain.  No urinary incontinence or retention, bowel incontinence or retention.  She has been taking all her medications very well.  Last labs at [facility] from June scanned into the media Rest of the pertinent 10 point ROS reviewed and negative.  We are continuing to follow her tumor marker: Lab Results  Component Value Date   CA2729 26.1 09/14/2022   CA2729 28.7 06/21/2022   CA2729 31.6 03/30/2022   CA2729 34.1 01/10/2022   CA2729 35.5 09/28/2021    REVIEW OF SYSTEMS:   A detailed  review of systems today was otherwise stable.   COVID 19 VACCINATION STATUS: Status post Pfizer x2 followed by booster August 2021; had COVID November 2022   BREAST CANCER HISTORY: From the original intake note:  [name] had screening mammography showing some suspicious calcifications in the right breast leading to right diagnostic mammography with ultrasonography 01/22/2016 at [facility]. The breast density was category C. In the upper right breast there was a 2.3 cm mass with additional masses measuring 0.9 and 0.7 cm. There was also a possible additional 0.8 mass in the lower inner quadrant. Ultrasound confirmed an irregular hypoechoic mass in the right breast upper outer quadrant measuring 2.0 cm. There were other masses measuring 0.7 and 0.8 cm by ultrasonography. The right axilla was sonographically benign.  Biopsy of a 12:00 and 4:00 mass in the right breast 01/22/2016 showed ([id number]) both specimens showing invasive ductal carcinoma, grade 1 or 2, both 95% estrogen receptor positive, both 95% progesterone receptor positive, both with strong staining intensity, with MIB-1 ranging from 10-15%, and both HER-2 negative, the signals ratio being 1.23-1.42, and the number per cell 1.85-2.59.  Her subsequent history is as detailed below   PAST MEDICAL HISTORY: Past Medical History:  Diagnosis Date   Breast cancer (HCC)    Cancer (HCC) 02/2016   right breast   DIABETES MELLITUS, TYPE II 01/04/2007   only takes actoplus daily   Dizziness and giddiness 02/29/2008   DVT, HX OF    at age 14 in right buttocks   Dyspnea    due to lung cancer   Family history of breast cancer    GERD 01/04/2007   pt reports resolved  GLAUCOMA 07/30/2008   both eyes   History of blood transfusion    no abnormal  reaction   History of uterine cancer 2000   hysterectomy done   HYPERLIPIDEMIA 01/04/2007   taking Pravastatin daily   HYPERTENSION 01/04/2007   takes Lisinopril daily   Joint pain    Joint  swelling    Leg cramps    LEG PAIN, LEFT 07/06/2007   NUMBNESS 07/30/2008   in fingers;pt states from [facility]   OSTEOARTHRITIS, HIP 09/25/2009   OTITIS MEDIA, ACUTE, BILATERAL 02/29/2008   Overweight(278.02) 01/04/2007   Peripheral vascular disease (HCC)    Personal history of radiation therapy 2018   Pneumonia    PONV (postoperative nausea and vomiting)    SLEEP APNEA, OBSTRUCTIVE    doesn't use a cpap;study done about 61yrs ago   TRANSIENT ISCHEMIC ATTACK, HX OF 01/04/2007   Vision loss    left eye    PAST SURGICAL HISTORY: Past Surgical History:  Procedure Laterality Date   ABDOMINAL HYSTERECTOMY  2000   BREAST LUMPECTOMY Right 01/13/2017   x2   BREAST LUMPECTOMY WITH RADIOACTIVE SEED AND SENTINEL LYMPH NODE BIOPSY Right 01/13/2017   Procedure: RIGHT BREAST RADIOACTIVE SEED X'S 2 GUIDED LUMPECTOMY WITH RADIOACTIVE SEED TARGETED AXILLARYLYMPH NODE EXCISION AND RIGHT AXILLARY SENTINEL LYMPH NODE BIOPSY;  Surgeon: Ovidio Kin, MD;  Location: [facility] OR;  Service: General;  Laterality: Right;  2 SEEDS IN RIGHT BREAST 1 SEED IN RIGHT AXILLARY NODE   CHOLECYSTECTOMY     ENDOBRONCHIAL ULTRASOUND Bilateral 03/28/2016   Procedure: ENDOBRONCHIAL ULTRASOUND;  Surgeon: Leslye Peer, MD;  Location: [facility];  Service: Cardiopulmonary;  Laterality: Bilateral;   EYE SURGERY  13   shunt left and lazer eye surgery on right cataract and retenia tear with repair   growth removal  2004   from thumb   KNEE ARTHROSCOPY Right    mulitple eye surgeries     both eyes, cataracts with ioc done both eyes   OOPHORECTOMY     right lumpectomy with axillary node dissection Right 01/2017   TOTAL HIP ARTHROPLASTY  06/24/2011   Procedure: TOTAL HIP ARTHROPLASTY;  Surgeon: Nestor Lewandowsky;  Location: [facility] OR;  Service: Orthopedics;  Laterality: Right;   TOTAL HIP ARTHROPLASTY Left 11/12/2012   Dr Turner Daniels   TOTAL HIP ARTHROPLASTY Left 11/12/2012   Procedure: TOTAL HIP ARTHROPLASTY;  Surgeon: Nestor Lewandowsky, MD;   Location: [facility] OR;  Service: Orthopedics;  Laterality: Left;  DEPUY PINNACLE    FAMILY HISTORY Family History  Problem Relation Age of Onset   Breast cancer Mother 28   Dementia Mother    Cancer Mother        Breast and lung cancer   Stroke Sister    Breast cancer Sister 78   Heart attack Maternal Aunt    Lung cancer Maternal Grandmother        non smoker   Glaucoma Maternal Grandfather    Anesthesia problems Neg Hx   The patient's father died at age 23, the patient's mother died at age 40. She had breast and lung cancers diagnosed shortly before her death. The patient had no brothers, 2 sisters. One sister was diagnosed with breast cancer at the age of 64.   GYNECOLOGIC HISTORY:  No LMP recorded. Patient has had a hysterectomy. Menarche age 23, first live birth age 76, the patient is GX P1. She had a hysterectomy for endometrial cancer in the year 2000. She did not take hormone replacement. She did  use oral contraceptives for more than 20 years remotely, with no complications.   SOCIAL HISTORY: (Updated July 2021). 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 She is home with her husband Shannon Fus. He is a retired Curator.Their son [name] also lives in [place].  The patient has 3 grandchildren aged 39, 69 and 31    ADVANCED DIRECTIVES: In place   HEALTH MAINTENANCE: Social History   Tobacco Use   Smoking status: Never   Smokeless tobacco: Never  Vaping Use   Vaping status: Never Used  Substance Use Topics   Alcohol use: No   Drug use: No     Colonoscopy: Never  PAP: Status post hysterectomy  Bone density: Remote   Allergies  Allergen Reactions   Codeine Hives    Hycodan syrup   Fluorescein Nausea And Vomiting    ? IV dye for retina specialist   Lipitor [Atorvastatin Calcium]     Leg cramp   Oxycodone Nausea And Vomiting    Patient vomited for 3 days after taking   Sitagliptin Phosphate Nausea And  Vomiting   Sulfa Drugs Cross Reactors Nausea And Vomiting    Current Outpatient Medications  Medication Sig Dispense Refill   acetaminophen (TYLENOL) 500 MG tablet Take 1,000 mg by mouth every 4 (four) hours as needed for moderate pain or fever.     aspirin EC 81 MG tablet Take 81 mg by mouth daily at 6 PM. 1700     Biotin 1 MG CAPS Take by mouth.     brimonidine (ALPHAGAN) 0.2 % ophthalmic solution INSTILL 1 DROP INTO EACH EYE THREE TIMES DAILY     cholecalciferol (VITAMIN D) 1000 units tablet Take 1,000 Units by mouth daily.     dorzolamide-timolol (COSOPT) 22.3-6.8 MG/ML ophthalmic solution Place 1 drop into both eyes 2 (two) times daily.     gabapentin (NEURONTIN) 100 MG capsule Take 200 mg at bedtime, can increase to 300 mg at bedtime if tolerated 90 capsule 3   ketorolac (ACULAR) 0.5 % ophthalmic solution Place 1 drop into the right eye 2 (two) times daily.     letrozole (FEMARA) 2.5 MG tablet Take 1 tablet (2.5 mg total) by mouth daily. 90 tablet 3   lisinopril (ZESTRIL) 5 MG tablet Take 1 tablet (5 mg total) by mouth daily. 90 tablet 3   lovastatin (MEVACOR) 20 MG tablet Take 1 tablet (20 mg total) by mouth at bedtime. 90 tablet 3   meloxicam (MOBIC) 15 MG tablet Take 1 tablet (15 mg total) by mouth daily. Take with food 30 tablet 0   Netarsudil-Latanoprost (ROCKLATAN) 0.02-0.005 % SOLN Apply to eye.     palbociclib (IBRANCE) 75 MG tablet Take 1 tablet (75 mg total) by mouth daily. Take for 21 days on, 7 days off, repeat every 28 days. 21 tablet 4   pioglitazone-metformin (ACTOPLUS MET) 15-850 MG tablet Take 1 tablet by mouth daily. 90 tablet 3   prednisoLONE acetate (PRED FORTE) 1 % ophthalmic suspension SMARTSIG:1 In Eye(s) 6 Times Daily     No current facility-administered medications for this visit.     OBJECTIVE: white woman in no acute distress  There were no vitals filed for this visit.    Wt Readings from Last 3 Encounters:  12/13/22 227 lb (103 kg)  11/25/22 226 lb  (102.5 kg)  09/20/22 221 lb 12.8 oz (100.6 kg)   There is no height or weight on file  to calculate BMI.    ECOG FS:1 - Symptomatic but completely ambulatory No change in physical exam since her last visit here Physical Exam Constitutional:      Appearance: Normal appearance.  Cardiovascular:     Rate and Rhythm: Normal rate and regular rhythm.     Pulses: Normal pulses.     Heart sounds: Normal heart sounds.  Pulmonary:     Effort: Pulmonary effort is normal.     Breath sounds: Normal breath sounds.  Musculoskeletal:        General: Swelling (Baseline lower extremity swelling with some varicose veins) and tenderness (Pinpoint tenderness at L4-L5 spine.  Patient almost jumped out of bed when we pushed on this area.) present. Normal range of motion.     Cervical back: Normal range of motion and neck supple. No rigidity.  Lymphadenopathy:     Cervical: No cervical adenopathy.  Skin:    General: Skin is warm and dry.  Neurological:     General: No focal deficit present.     Mental Status: She is alert.  Psychiatric:        Mood and Affect: Mood normal.      LAB RESULTS:  CMP     Component Value Date/Time   NA 138 09/14/2022 1228   NA 140 05/29/2017 1254   K 4.3 09/14/2022 1228   K 4.4 05/29/2017 1254   CL 106 09/14/2022 1228   CO2 26 09/14/2022 1228   CO2 25 05/29/2017 1254   GLUCOSE 170 (H) 09/14/2022 1228   GLUCOSE 154 (H) 05/29/2017 1254   BUN 22 09/14/2022 1228   BUN 11.5 05/29/2017 1254   CREATININE 0.77 09/14/2022 1228   CREATININE 0.8 05/29/2017 1254   CALCIUM 9.4 09/14/2022 1228   CALCIUM 9.3 05/29/2017 1254   PROT 7.2 09/14/2022 1228   PROT 7.0 05/29/2017 1254   ALBUMIN 4.3 09/14/2022 1228   ALBUMIN 4.1 05/29/2017 1254   AST 11 (L) 09/14/2022 1228   AST 13 05/29/2017 1254   ALT 11 09/14/2022 1228   ALT 14 05/29/2017 1254   ALKPHOS 61 09/14/2022 1228   ALKPHOS 66 05/29/2017 1254   BILITOT 0.5 09/14/2022 1228   BILITOT 0.50 05/29/2017 1254    GFRNONAA >60 09/14/2022 1228   GFRAA >60 03/10/2020 1138    INo results found for: "SPEP", "UPEP"  Lab Results  Component Value Date   WBC 2.5 (L) 09/14/2022   NEUTROABS 1.4 (L) 09/14/2022   HGB 12.4 09/14/2022   HCT 36.7 09/14/2022   MCV 96.1 09/14/2022   PLT 189 09/14/2022      Chemistry      Component Value Date/Time   NA 138 09/14/2022 1228   NA 140 05/29/2017 1254   K 4.3 09/14/2022 1228   K 4.4 05/29/2017 1254   CL 106 09/14/2022 1228   CO2 26 09/14/2022 1228   CO2 25 05/29/2017 1254   BUN 22 09/14/2022 1228   BUN 11.5 05/29/2017 1254   CREATININE 0.77 09/14/2022 1228   CREATININE 0.8 05/29/2017 1254      Component Value Date/Time   CALCIUM 9.4 09/14/2022 1228   CALCIUM 9.3 05/29/2017 1254   ALKPHOS 61 09/14/2022 1228   ALKPHOS 66 05/29/2017 1254   AST 11 (L) 09/14/2022 1228   AST 13 05/29/2017 1254   ALT 11 09/14/2022 1228   ALT 14 05/29/2017 1254   BILITOT 0.5 09/14/2022 1228   BILITOT 0.50 05/29/2017 1254       No results found for: "LABCA2"  No components found for: "LABCA125"  No results for input(s): "INR" in the last 168 hours.  Urinalysis    Component Value Date/Time   COLORURINE YELLOW 11/25/2022 1005   APPEARANCEUR CLEAR 11/25/2022 1005   LABSPEC 1.025 11/25/2022 1005   PHURINE 6.0 11/25/2022 1005   GLUCOSEU NEGATIVE 11/25/2022 1005   HGBUR NEGATIVE 11/25/2022 1005   BILIRUBINUR NEGATIVE 11/25/2022 1005   KETONESUR NEGATIVE 11/25/2022 1005   PROTEINUR 30 (A) 08/18/2017 1322   UROBILINOGEN 0.2 11/25/2022 1005   NITRITE POSITIVE (A) 11/25/2022 1005   LEUKOCYTESUR MODERATE (A) 11/25/2022 1005    STUDIES: No results found.   ELIGIBLE FOR AVAILABLE RESEARCH PROTOCOL: no  ASSESSMENT: 77 y.o. [place] woman with a remote history of early stage endometrial cancer, subsequently status post right breast upper outer quadrant biopsy 01/22/2016 for a clinically multifocal T2 N0, stage 2A invasive ductal carcinoma, grade 1, estrogen  and progesterone receptor positive, HER-2 negative, with an MIB-1 between 10 and 15%.  (1) right axillary lymph node biopsy 03/02/2016 positive  (2) genetics testing 01/13/2016 through the Custom gene panel offered by [facility] found no deleterious mutations in  ATM, BARD1, BRCA1, BRCA2, BRIP1, CDH1, CHEK2, EPCAM, FANCC, MLH1, MSH2, MSH6, MUTYH, NBN, PALB2, PMS2, POLD1, PTEN, RAD51C, RAD51D, TP53, and XRCC2  METASTATIC DISEASE: OCT 2017 (3) CT scans of the chest abdomen and pelvis obtained 03/10/2016 are consistent with bilateral lung metastases and mediastinal and hilar nodal involvement, but no liver or bone spread  (a) bronchoscopic lymph node biopsy 2 (station 7, 13R) 03/28/2016 confirms metastatic adenocarcinoma, estrogen receptor positive, HER-2 not amplified  (b) baseline CA-27-29 on 04/18/2016 was 137.5.  (4) letrozole started 03/15/2016, palbociclib added 03/29/2016 at 125 mg/day, 21/7  (a) dose decreased to 100 mg per day, 21/7, beginning with February cycle  (b) palbociclib held 01/31/2017, with increasing symptoms  (c) palbociclib resumed October 2018 at 75 mg daily  (d) palbociclib dose reduced to 75 mg every other day February through April 2019  (e) palbociclib dose resumed at 75 mg daily as of 10/17/2017  (f) palbociclib dose decreased to 75 mg every other day beginning 07/31/2019  (g) palbociclib resumed at 75 mg daily, 21 days on 7 off, as of 08/25/2019  (5) status post double right lumpectomies and right axillary lymph node sampling 01/13/2017 for 2 separate invasive ductal carcinoma lesions, pT1a and pT1b, N1a, with negative margins, both lesions being estrogen and progesterone receptor positive and HER-2 negative  (6) adjuvant radiation completed 07/05/2017 1. 50.4 Gy in 28 fractions to the right breast and supraclavicular region using whole-breast tangent fields. 2. Boost to the seroma delivered an additional 10 Gy in 5 fractions. The total dose was 60.4 Gy.  (7)  restaging studies:  (a) CT scan of the chest and bone scan 06/23/2017 showed stable scattered very small lung nodules, no bone lesions  (b) CT of the chest 10/17/2017 showed no new or progressive metastatic disease in the chest. The small left lower lobe pulmonary nodule is stable  (c) PET scan on 03/02/2018: shows no findings for residual or recurrent right breast cancer  (d) chest CT scan stable, questionable right renal cyst noted  (e) chest CT 09/18/2020 shows no measurable disease  (f) to the CT scan 04/29/2021 shows no evidence of active disease; a 2 cm thyroid nodule was noted   (8) right upper pole renal lesion noted to be enlarging on CT scan 06/22/2017  (a) no uptake on PET scan obtained 03/02/2018  #9 Obrien recent imaging  with no new suspicious mass or lymphadenopathy identified in the chest abdomen or pelvis.  Stable chronic pleural and parenchymal scarring densities in the right lung apex.  Stable chronic moderate to severe biliary ductal dilatation.  Stable chronic 11 mm left adrenal gland nodule.  No dedicated follow-up required   PLAN:  She continues on Ibrance 75 mg once daily 3 weeks on 1 week off and letrozole daily.   She now complains of severe back pain for almost 6 weeks, denies any falls, x-ray was without any major issues. I recommended to proceed with CT imaging sooner than needed.  She does not want to drink oral contrast.  This should be okay.  I asked for telephone visit to follow-up on the CT imaging so we can convey the results.  In the interim she wants to continue Tylenol every 8 hours. On exam, there is palpable musculoskeletal tenderness at the L4/L5 spinal region.  SLR test was negative.  No weakness in the lower extremities.  No urinary incontinence or retention reported.  No bowel incontinence or retention reported.  If there was any concern for progression of disease or new osseous metastasis, then we will have to bring her back for a follow-up to discuss  additional treatments.  All her questions were answered to the best my knowledge.  Total time spent: 30 min  Thank you for consulting [place] in the care of this patient.  Please not hesitate contact us with any additional questions or concerns.  *Total Encounter Time as defined by the Centers for Medicare and Medicaid Services includes, in addition to the face-to-face time of a patient visit (documented in the note above) non-face-to-face time: obtaining and reviewing outside history, ordering and reviewing medications, tests or procedures, care coordination (communications with other health care professionals or caregivers) and documentation in the medical record.  Rachel Moulds MD

## 2023-01-10 ENCOUNTER — Ambulatory Visit
Admission: RE | Admit: 2023-01-10 | Discharge: 2023-01-10 | Disposition: A | Discharge status: Home / Self Care | Payer: PPO | Source: Ambulatory Visit | Attending: Hematology and Oncology | Admitting: Hematology and Oncology

## 2023-01-10 DIAGNOSIS — C50411 Malignant neoplasm of upper-outer quadrant of right female breast: Secondary | ICD-10-CM | POA: Diagnosis not present

## 2023-01-10 DIAGNOSIS — Z170 Estrogen receptor positive status [ER+]: Secondary | ICD-10-CM | POA: Insufficient documentation

## 2023-01-10 DIAGNOSIS — I517 Cardiomegaly: Secondary | ICD-10-CM | POA: Diagnosis not present

## 2023-01-10 DIAGNOSIS — I2510 Atherosclerotic heart disease of native coronary artery without angina pectoris: Secondary | ICD-10-CM | POA: Diagnosis not present

## 2023-01-10 DIAGNOSIS — N289 Disorder of kidney and ureter, unspecified: Secondary | ICD-10-CM | POA: Diagnosis not present

## 2023-01-10 LAB — POCT I-STAT CREATININE: Creatinine, Ser: 0.5 mg/dL (ref 0.44–1.00)

## 2023-01-10 MED ORDER — IOHEXOL 300 MG/ML  SOLN
100.0000 mL | Freq: Once | INTRAMUSCULAR | Status: AC | PRN
  Administered 2023-01-10: 100 mL via INTRAVENOUS

## 2023-01-13 ENCOUNTER — Telehealth: Payer: Self-pay | Admitting: Adult Health

## 2023-01-13 ENCOUNTER — Telehealth: Payer: Self-pay

## 2023-01-13 NOTE — Telephone Encounter (Signed)
S/w pt as she has concerns regarding the results of her CT. She is concerned d/t the ongoing back pain. Pt states, "Why is Mardella Layman waiting so long to talk to me after my CT scan." Advised pt that since she does not have any metastasis, she is scheduled for 8/16. Pt was offered sooner appt with Wardell Honour, PA and pt declined. Advised pt that typically in cases we will send a referral for sports medicine to help alleviate pain and advised pt I would consult with Mardella Layman about this. Pt refused and states. "I don't even really like Mardella Layman but I guess I will just continue the tylenol I have been taking and wait for my appt." Pt was once again offered appt with Georga Kaufmann, PA since pt verbalized she does not like Lillard Anes, NP. Pt raised her voice, stating, "Do not say that! I know I said I don't like her but you don't get to say that!" Pt clearly disgruntled, I ended the call by advising the pt to call us with any concerns.

## 2023-01-13 NOTE — Telephone Encounter (Signed)
Per Lillard Anes, DNP, called pt with message below. Pt had concerns with lower back pain. Advised pt to take OTC tylenol or prescribed pain med. Advised if pain worsens to reach out to PCP.

## 2023-01-13 NOTE — Telephone Encounter (Signed)
-----   Message from Noreene Filbert sent at 01/13/2023 11:53 AM EDT ----- Scans are good.  Please let patient know I will review further with her at her upcoming appt ----- Message ----- From: Interface, Rad Results In Sent: 01/13/2023  11:43 AM EDT To: Rachel Moulds, MD

## 2023-01-13 NOTE — Telephone Encounter (Signed)
Patient called in to cancel their appointment; patient stated they will call back when they were ready to reschedule appointment with Dr. Al Pimple

## 2023-01-18 ENCOUNTER — Other Ambulatory Visit (HOSPITAL_COMMUNITY): Payer: PPO

## 2023-01-27 ENCOUNTER — Telehealth: Payer: PPO | Admitting: Adult Health

## 2023-02-01 ENCOUNTER — Encounter (INDEPENDENT_AMBULATORY_CARE_PROVIDER_SITE_OTHER): Payer: PPO | Admitting: Ophthalmology

## 2023-02-06 ENCOUNTER — Ambulatory Visit: Payer: PPO

## 2023-02-06 VITALS — Ht 65.0 in | Wt 225.0 lb

## 2023-02-06 DIAGNOSIS — Z0000 Encounter for general adult medical examination without abnormal findings: Secondary | ICD-10-CM

## 2023-02-06 NOTE — Progress Notes (Signed)
Subjective:   [name] is a 77 y.o. female who presents for Medicare Annual (Subsequent) preventive examination.  Visit Complete: Virtual  I connected with  [name] on 02/06/23 by a audio enabled telemedicine application and verified that I am speaking with the correct person using two identifiers.  Patient Location: Home  Provider Location: Office/Clinic  I discussed the limitations of evaluation and management by telemedicine. The patient expressed understanding and agreed to proceed.  Vital Signs: Because this visit was a virtual/telehealth visit, some criteria may be missing or patient reported. Any vitals not documented were not able to be obtained and vitals that have been documented are patient reported.    Review of Systems     Cardiac Risk Factors include: advanced age (>25men, >64 women);diabetes mellitus;dyslipidemia;family history of premature cardiovascular disease;hypertension;obesity (BMI >30kg/m2)     Objective:    Today's Vitals   02/06/23 1001  Weight: 225 lb (102.1 kg)  Height: 5\' 5"  (1.651 m)  PainSc: 0-No pain   Body mass index is 37.44 kg/m.     02/06/2023   10:02 AM 02/25/2022    2:20 PM 02/24/2021   11:25 AM 08/08/2017   10:27 AM 04/25/2017    1:10 PM 02/02/2017    8:25 AM 01/04/2017   11:00 AM  Advanced Directives  Does Patient Have a Medical Advance Directive? Yes Yes Yes Yes Yes Yes Yes  Type of Estate agent of [place];Living will Healthcare Power of [place];Living will Living will;Healthcare Power of [employer] Power of [place];Living will Healthcare Power of [place];Living will  Healthcare Power of [place];Living will  Does patient want to make changes to medical advance directive?   No - Patient declined      Copy of Healthcare Power of Attorney in Chart? No - copy requested No - copy requested No - copy requested Yes  Yes     Current Medications (verified) Outpatient Encounter Medications as of  02/06/2023  Medication Sig   acetaminophen (TYLENOL) 500 MG tablet Take 1,000 mg by mouth every 4 (four) hours as needed for moderate pain or fever.   aspirin EC 81 MG tablet Take 81 mg by mouth daily at 6 PM. 1700   Biotin 1 MG CAPS Take by mouth.   brimonidine (ALPHAGAN) 0.2 % ophthalmic solution INSTILL 1 DROP INTO EACH EYE THREE TIMES DAILY   cholecalciferol (VITAMIN D) 1000 units tablet Take 1,000 Units by mouth daily.   dorzolamide-timolol (COSOPT) 22.3-6.8 MG/ML ophthalmic solution Place 1 drop into both eyes 2 (two) times daily.   gabapentin (NEURONTIN) 100 MG capsule Take 200 mg at bedtime, can increase to 300 mg at bedtime if tolerated   ketorolac (ACULAR) 0.5 % ophthalmic solution Place 1 drop into the right eye 2 (two) times daily.   letrozole (FEMARA) 2.5 MG tablet Take 1 tablet (2.5 mg total) by mouth daily.   lisinopril (ZESTRIL) 5 MG tablet Take 1 tablet (5 mg total) by mouth daily.   lovastatin (MEVACOR) 20 MG tablet Take 1 tablet (20 mg total) by mouth at bedtime.   meloxicam (MOBIC) 15 MG tablet Take 1 tablet (15 mg total) by mouth daily. Take with food   Netarsudil-Latanoprost (ROCKLATAN) 0.02-0.005 % SOLN Apply to eye.   palbociclib (IBRANCE) 75 MG tablet Take 1 tablet (75 mg total) by mouth daily. Take for 21 days on, 7 days off, repeat every 28 days.   pioglitazone-metformin (ACTOPLUS MET) 15-850 MG tablet Take 1 tablet by mouth daily.  prednisoLONE acetate (PRED FORTE) 1 % ophthalmic suspension SMARTSIG:1 In Eye(s) 6 Times Daily   No facility-administered encounter medications on file as of 02/06/2023.    Allergies (verified) Codeine, Fluorescein, Lipitor [atorvastatin calcium], Oxycodone, Sitagliptin phosphate, and Sulfa drugs cross reactors   History: Past Medical History:  Diagnosis Date   Breast cancer (HCC)    Cancer (HCC) 02/2016   right breast   DIABETES MELLITUS, TYPE II 01/04/2007   only takes actoplus daily   Dizziness and giddiness 02/29/2008   DVT,  HX OF    at age 2 in right buttocks   Dyspnea    due to lung cancer   Family history of breast cancer    GERD 01/04/2007   pt reports resolved    GLAUCOMA 07/30/2008   both eyes   History of blood transfusion    no abnormal  reaction   History of uterine cancer 2000   hysterectomy done   HYPERLIPIDEMIA 01/04/2007   taking Pravastatin daily   HYPERTENSION 01/04/2007   takes Lisinopril daily   Joint pain    Joint swelling    Leg cramps    LEG PAIN, LEFT 07/06/2007   NUMBNESS 07/30/2008   in fingers;pt states from Diamox   OSTEOARTHRITIS, HIP 09/25/2009   OTITIS MEDIA, ACUTE, BILATERAL 02/29/2008   Overweight(278.02) 01/04/2007   Peripheral vascular disease (HCC)    Personal history of radiation therapy 2018   Pneumonia    PONV (postoperative nausea and vomiting)    SLEEP APNEA, OBSTRUCTIVE    doesn't use a cpap;study done about 5yrs ago   TRANSIENT ISCHEMIC ATTACK, HX OF 01/04/2007   Vision loss    left eye   Past Surgical History:  Procedure Laterality Date   ABDOMINAL HYSTERECTOMY  2000   BREAST LUMPECTOMY Right 01/13/2017   x2   BREAST LUMPECTOMY WITH RADIOACTIVE SEED AND SENTINEL LYMPH NODE BIOPSY Right 01/13/2017   Procedure: RIGHT BREAST RADIOACTIVE SEED X'S 2 GUIDED LUMPECTOMY WITH RADIOACTIVE SEED TARGETED AXILLARYLYMPH NODE EXCISION AND RIGHT AXILLARY SENTINEL LYMPH NODE BIOPSY;  Surgeon: Ovidio Kin, MD;  Location: [facility] OR;  Service: General;  Laterality: Right;  2 SEEDS IN RIGHT BREAST 1 SEED IN RIGHT AXILLARY NODE   CHOLECYSTECTOMY     ENDOBRONCHIAL ULTRASOUND Bilateral 03/28/2016   Procedure: ENDOBRONCHIAL ULTRASOUND;  Surgeon: Leslye Peer, MD;  Location: [facility];  Service: Cardiopulmonary;  Laterality: Bilateral;   EYE SURGERY  13   shunt left and lazer eye surgery on right cataract and retenia tear with repair   growth removal  2004   from thumb   KNEE ARTHROSCOPY Right    mulitple eye surgeries     both eyes, cataracts with ioc done both eyes    OOPHORECTOMY     right lumpectomy with axillary node dissection Right 01/2017   TOTAL HIP ARTHROPLASTY  06/24/2011   Procedure: TOTAL HIP ARTHROPLASTY;  Surgeon: Nestor Lewandowsky;  Location: [facility] OR;  Service: Orthopedics;  Laterality: Right;   TOTAL HIP ARTHROPLASTY Left 11/12/2012   Dr Turner Daniels   TOTAL HIP ARTHROPLASTY Left 11/12/2012   Procedure: TOTAL HIP ARTHROPLASTY;  Surgeon: Nestor Lewandowsky, MD;  Location: [facility] OR;  Service: Orthopedics;  Laterality: Left;  DEPUY PINNACLE   Family History  Problem Relation Age of Onset   Breast cancer Mother 68   Dementia Mother    Cancer Mother        Breast and lung cancer   Stroke Sister    Breast cancer Sister 67  Heart attack Maternal Aunt    Lung cancer Maternal Grandmother        non smoker   Glaucoma Maternal Grandfather    Anesthesia problems Neg Hx    Social History   Socioeconomic History   Marital status: Married    Spouse name: Not on file   Number of children: Not on file   Years of education: Not on file   Highest education level: Not on file  Occupational History   Occupation: office administrator    Employer: [employer]  Tobacco Use   Smoking status: Never   Smokeless tobacco: Never  Vaping Use   Vaping status: Never Used  Substance and Sexual Activity   Alcohol use: No   Drug use: No   Sexual activity: Not Currently  Other Topics Concern   Not on file  Social History Narrative   Not on file   Social Determinants of Health   Financial Resource Strain: Low Risk  (02/06/2023)   Overall Financial Resource Strain (CARDIA)    Difficulty of Paying Living Expenses: Not hard at all  Food Insecurity: No Food Insecurity (02/06/2023)   Hunger Vital Sign    Worried About Running Out of Food in the Last Year: Never true    Ran Out of Food in the Last Year: Never true  Transportation Needs: No Transportation Needs (02/06/2023)   PRAPARE - Administrator, Civil Service (Medical): No    Lack of  Transportation (Non-Medical): No  Physical Activity: Sufficiently Active (02/06/2023)   Exercise Vital Sign    Days of Exercise per Week: 5 days    Minutes of Exercise per Session: 30 min  Stress: No Stress Concern Present (02/06/2023)   [employer] of Occupational Health - Occupational Stress Questionnaire    Feeling of Stress : Not at all  Social Connections: Socially Integrated (02/06/2023)   Social Connection and Isolation Panel [NHANES]    Frequency of Communication with Friends and Family: More than three times a week    Frequency of Social Gatherings with Friends and Family: More than three times a week    Attends Religious Services: More than 4 times per year    Active Member of [employer] or Organizations: Yes    Attends Engineer, structural: More than 4 times per year    Marital Status: Married    Tobacco Counseling Counseling given: Not Answered   Clinical Intake:  Pre-visit preparation completed: Yes  Pain : No/denies pain Pain Score: 0-No pain     BMI - recorded: 37.44 Nutritional Status: BMI > 30  Obese Nutritional Risks: None Diabetes: No  How often do you need to have someone help you when you read instructions, pamphlets, or other written materials from your doctor or pharmacy?: 1 - Never What is the last grade level you completed in school?: 1 YEAR OF COLLEGE  Interpreter Needed?: No  Information entered by :: Anquan Azzarello N. Eunique Balik, LPN.   Activities of Daily Living    02/06/2023   10:06 AM 02/25/2022    2:32 PM  In your present state of health, do you have any difficulty performing the following activities:  Hearing? 0 0  Vision? 0 0  Difficulty concentrating or making decisions? 0 0  Walking or climbing stairs? 0 0  Dressing or bathing? 0 0  Doing errands, shopping? 0 0  Preparing Food and eating ? Y N  Using the Toilet? N N  In the past six months, have you  accidently leaked urine? Y N  Do you have problems with loss of bowel control?  N N  Managing your Medications? N N  Managing your Finances? N N  Housekeeping or managing your Housekeeping? N N    Patient Care Team: Myrlene Broker, MD as PCP - General (Internal Medicine) Ovidio Kin, MD as Consulting Physician (General Surgery) Magrinat, Valentino Hue, MD (Inactive) as Consulting Physician (Oncology) Dorothy Puffer, MD as Consulting Physician (Radiation Oncology) Carrington Clamp, MD as Consulting Physician (Obstetrics and Gynecology) Belva Chimes, MD as Referring Physician (Specialist) Leslye Peer, MD as Consulting Physician (Pulmonary Disease) Berneice Heinrich Delbert Phenix., MD as Consulting Physician (Urology) Loraine Grip, Harrietta Guardian, MD as Referring Physician (Ophthalmology) Anselm Lis, RPH-CPP (Pharmacist) Augustin Schooling, MD as Consulting Physician (Ophthalmology)  Indicate any recent Medical Services you may have received from other than [facility] providers in the past year (date may be approximate).     Assessment:   This is a routine wellness examination for Angelean.  Hearing/Vision screen Hearing Screening - Comments:: Patient denied any hearing difficulty.   No hearing aids.  Vision Screening - Comments:: Patient does wear otc readers.  Eye exam done by: Candise Che, MD.   Dietary issues and exercise activities discussed:     Goals Addressed               This Visit's Progress     Patient Stated (pt-stated)        My healthcare goal for 2024-2025 is to maintain my current health status by continuing to eat healthy, stay independent, physically and socially active.      Depression Screen    02/06/2023   10:05 AM 08/31/2022   10:29 AM 06/27/2022    3:12 PM 06/27/2022    3:11 PM 02/25/2022    2:28 PM 01/25/2022    1:17 PM 10/15/2021   11:11 AM  PHQ 2/9 Scores  PHQ - 2 Score 0 0 0 0 0 0 0  PHQ- 9 Score 0 0 0  2 2 0    Fall Risk    02/06/2023   10:03 AM 11/25/2022    9:20 AM 08/31/2022   10:29 AM 06/27/2022    3:11 PM 02/25/2022     2:21 PM  Fall Risk   Falls in the past year? 0 0 0 0 0  Number falls in past yr: 0 0 0 0 0  Injury with Fall? 0 0  0 0  Risk for fall due to : No Fall Risks    No Fall Risks  Follow up Falls prevention discussed Falls evaluation completed Falls evaluation completed Falls evaluation completed Falls prevention discussed    MEDICARE RISK AT HOME: Medicare Risk at Home Any stairs in or around the home?: No If so, are there any without handrails?: No Home free of loose throw rugs in walkways, pet beds, electrical cords, etc?: Yes Adequate lighting in your home to reduce risk of falls?: Yes Life alert?: No Use of a cane, walker or w/c?: No Grab bars in the bathroom?: Yes Shower chair or bench in shower?: Yes Elevated toilet seat or a handicapped toilet?: Yes  TIMED UP AND GO:  Was the test performed?  No    Cognitive Function:        02/06/2023   10:07 AM 02/25/2022    2:33 PM  6CIT Screen  What Year? 0 points 0 points  What month? 0 points 0 points  What time? 0 points  0 points  Count back from 20 0 points 0 points  Months in reverse 0 points 0 points  Repeat phrase 0 points 0 points  Total Score 0 points 0 points    Immunizations Immunization History  Administered Date(s) Administered   Fluad Quad(high Dose 65+) 03/07/2022   Influenza Split 04/05/2012, 03/16/2020   Influenza Whole 03/27/2006, 04/14/2008   Influenza, High Dose Seasonal PF 02/25/2019, 03/17/2021   Influenza,inj,Quad PF,6+ Mos 03/27/2014   Influenza-Unspecified 03/13/2013, 03/11/2015, 03/07/2016, 03/07/2017, 03/08/2018, 03/27/2018, 03/01/2019   PFIZER(Purple Top)SARS-COV-2 Vaccination 07/03/2019, 07/24/2019, 02/27/2020, 09/10/2020, 04/19/2021    TDAP status: Declined  Flu Vaccine status: Due, Education has been provided regarding the importance of this vaccine. Advised may receive this vaccine at local pharmacy or Health Dept. Aware to provide a copy of the vaccination record if obtained from local  pharmacy or Health Dept. Verbalized acceptance and understanding.  Pneumococcal vaccine status: Declined,  Education has been provided regarding the importance of this vaccine but patient still declined. Advised may receive this vaccine at local pharmacy or Health Dept. Aware to provide a copy of the vaccination record if obtained from local pharmacy or Health Dept. Verbalized acceptance and understanding.   Covid-19 vaccine status: Declined, Education has been provided regarding the importance of this vaccine but patient still declined. Advised may receive this vaccine at local pharmacy or Health Dept.or vaccine clinic. Aware to provide a copy of the vaccination record if obtained from local pharmacy or Health Dept. Verbalized acceptance and understanding.  Qualifies for Shingles Vaccine? Yes   Zostavax completed No   Shingrix Completed?: No.    Education has been provided regarding the importance of this vaccine. Patient has been advised to call insurance company to determine out of pocket expense if they have not yet received this vaccine. Advised may also receive vaccine at local pharmacy or Health Dept. Verbalized acceptance and understanding.  Screening Tests Health Maintenance  Topic Date Due   Hepatitis C Screening  Never done   DTaP/Tdap/Td (1 - Tdap) Never done   Zoster Vaccines- Shingrix (1 of 2) Never done   DEXA SCAN  Never done   OPHTHALMOLOGY EXAM  11/30/2021   COVID-19 Vaccine (6 - 2023-24 season) 02/11/2022   INFLUENZA VACCINE  01/12/2023   HEMOGLOBIN A1C  05/24/2023   FOOT EXAM  08/31/2023   Diabetic kidney evaluation - eGFR measurement  09/14/2023   Diabetic kidney evaluation - Urine ACR  11/22/2023   Medicare Annual Wellness (AWV)  02/06/2024   HPV VACCINES  Aged Out   Pneumonia Vaccine 64+ Years old  Discontinued    Health Maintenance  Health Maintenance Due  Topic Date Due   Hepatitis C Screening  Never done   DTaP/Tdap/Td (1 - Tdap) Never done   Zoster  Vaccines- Shingrix (1 of 2) Never done   DEXA SCAN  Never done   OPHTHALMOLOGY EXAM  11/30/2021   COVID-19 Vaccine (6 - 2023-24 season) 02/11/2022   INFLUENZA VACCINE  01/12/2023    Colorectal cancer screening: No longer required.   Mammogram status: Completed 10/24/2022. Repeat every year  Bone Density status: Never done.  Lung Cancer Screening: (Low Dose CT Chest recommended if Age 50-80 years, 20 pack-year currently smoking OR have quit w/in 15years.) does not qualify.   Lung Cancer Screening Referral: no  Additional Screening:  Hepatitis C Screening: does qualify; Completed: no  Vision Screening: Recommended annual ophthalmology exams for early detection of glaucoma and other disorders of the eye. Is the patient up to  date with their annual eye exam?  Yes  Who is the provider or what is the name of the office in which the patient attends annual eye exams? Candise Che, MD. If pt is not established with a provider, would they like to be referred to a provider to establish care? No .   Dental Screening: Recommended annual dental exams for proper oral hygiene  Diabetic Foot Exam: Diabetic Foot Exam: Completed 08/31/2022  Community Resource Referral / Chronic Care Management: CRR required this visit?  No   CCM required this visit?  No     Plan:     I have personally reviewed and noted the following in the patient's chart:   Medical and social history Use of alcohol, tobacco or illicit drugs  Current medications and supplements including opioid prescriptions. Patient is not currently taking opioid prescriptions. Functional ability and status Nutritional status Physical activity Advanced directives List of other physicians Hospitalizations, surgeries, and ER visits in previous 12 months Vitals Screenings to include cognitive, depression, and falls Referrals and appointments  In addition, I have reviewed and discussed with patient certain preventive protocols,  quality metrics, and best practice recommendations. A written personalized care plan for preventive services as well as general preventive health recommendations were provided to patient.     Mickeal Needy, LPN   [id number]   After Visit Summary: (Mail) Due to this being a telephonic visit, the after visit summary with patients personalized plan was offered to patient via mail   Nurse Notes: Normal cognitive status assessed by direct observation via telephone conversation by this Nurse Health Advisor. No abnormalities found.

## 2023-02-06 NOTE — Patient Instructions (Addendum)
Ms. Croll , Thank you for taking time to come for your Medicare Wellness Visit. I appreciate your ongoing commitment to your health goals. Please review the following plan we discussed and let me know if I can assist you in the future.   Referrals/Orders/Follow-Ups/Clinician Recommendations: No  This is a list of the screening recommended for you and due dates:  Health Maintenance  Topic Date Due   Hepatitis C Screening  Never done   DTaP/Tdap/Td vaccine (1 - Tdap) Never done   Zoster (Shingles) Vaccine (1 of 2) Never done   DEXA scan (bone density measurement)  Never done   Eye exam for diabetics  11/30/2021   COVID-19 Vaccine (6 - 2023-24 season) 02/11/2022   Flu Shot  01/12/2023   Hemoglobin A1C  05/24/2023   Complete foot exam   08/31/2023   Yearly kidney function blood test for diabetes  09/14/2023   Yearly kidney health urinalysis for diabetes  11/22/2023   Medicare Annual Wellness Visit  02/06/2024   HPV Vaccine  Aged Out   Pneumonia Vaccine  Discontinued    Advanced directives: (Copy Requested) Please bring a copy of your health care power of attorney and living will to the office to be added to your chart at your convenience.  Next Medicare Annual Wellness Visit scheduled for next year: Yes

## 2023-02-07 ENCOUNTER — Telehealth: Payer: Self-pay

## 2023-02-07 NOTE — Telephone Encounter (Signed)
Spoke to Mrs. [name] about her upcoming appts and she does not have any.  We looked at her [name] schedule and she is due to get labs and come in the week of 10/9-10/15/2024.  Message sent to scheduling to have her scheduled and called.  Copying in Val to send orders to [facility] for [name]. Lorayne Marek, RN

## 2023-02-15 ENCOUNTER — Telehealth: Payer: Self-pay

## 2023-02-15 DIAGNOSIS — E1169 Type 2 diabetes mellitus with other specified complication: Secondary | ICD-10-CM | POA: Diagnosis not present

## 2023-02-15 DIAGNOSIS — H35369 Drusen (degenerative) of macula, unspecified eye: Secondary | ICD-10-CM | POA: Diagnosis not present

## 2023-02-15 DIAGNOSIS — Z961 Presence of intraocular lens: Secondary | ICD-10-CM | POA: Diagnosis not present

## 2023-02-15 DIAGNOSIS — H401134 Primary open-angle glaucoma, bilateral, indeterminate stage: Secondary | ICD-10-CM | POA: Diagnosis not present

## 2023-02-15 NOTE — Telephone Encounter (Signed)
Pt LVM requesting lab appt with MD appt coming up. Pt made aware that a lab appt was added on 03/28/23 at 1300 prior to her MD appt at 1330. Pt explained she thinks she is supposed to finish her ibrance therapy next Tuesday and possibly needs to be seen sooner. Pt states she will call back this afternoon to double-check her schedule to confirm.

## 2023-02-17 ENCOUNTER — Telehealth: Payer: Self-pay | Admitting: Hematology and Oncology

## 2023-03-01 ENCOUNTER — Ambulatory Visit: Payer: PPO | Admitting: Hematology and Oncology

## 2023-03-01 ENCOUNTER — Other Ambulatory Visit: Payer: PPO

## 2023-03-03 ENCOUNTER — Other Ambulatory Visit: Payer: Self-pay

## 2023-03-03 MED ORDER — PALBOCICLIB 75 MG PO TABS: 75.0000 mg | ORAL_TABLET | Freq: Every day | ORAL | 4 refills | Status: DC

## 2023-03-08 ENCOUNTER — Encounter: Payer: Self-pay | Admitting: Pharmacist

## 2023-03-08 NOTE — Progress Notes (Signed)
Pharmacy Quality Measure Review  This patient is appearing on a report for being at risk of failing the adherence measure for cholesterol (statin) medications this calendar year.   Medication: Lovastatin 20 mg daily Last fill date: 02/14/2023 for 90 day supply  Insurance report was not up to date. No action needed at this time.   Arbutus Leas, PharmD, BCPS [facility] [phone]

## 2023-03-22 DIAGNOSIS — H20013 Primary iridocyclitis, bilateral: Secondary | ICD-10-CM | POA: Diagnosis not present

## 2023-03-22 DIAGNOSIS — H04123 Dry eye syndrome of bilateral lacrimal glands: Secondary | ICD-10-CM | POA: Diagnosis not present

## 2023-03-22 DIAGNOSIS — H401134 Primary open-angle glaucoma, bilateral, indeterminate stage: Secondary | ICD-10-CM | POA: Diagnosis not present

## 2023-03-22 DIAGNOSIS — H35353 Cystoid macular degeneration, bilateral: Secondary | ICD-10-CM | POA: Diagnosis not present

## 2023-03-28 ENCOUNTER — Encounter: Payer: Self-pay | Admitting: Hematology and Oncology

## 2023-03-28 ENCOUNTER — Inpatient Hospital Stay: Payer: PPO | Attending: Hematology and Oncology | Admitting: Hematology and Oncology

## 2023-03-28 ENCOUNTER — Inpatient Hospital Stay: Payer: PPO | Attending: Hematology and Oncology

## 2023-03-28 VITALS — BP 192/71 | HR 67 | Temp 97.2°F | Resp 18 | Wt 229.0 lb

## 2023-03-28 DIAGNOSIS — R1310 Dysphagia, unspecified: Secondary | ICD-10-CM | POA: Diagnosis not present

## 2023-03-28 DIAGNOSIS — Z170 Estrogen receptor positive status [ER+]: Secondary | ICD-10-CM

## 2023-03-28 DIAGNOSIS — C7801 Secondary malignant neoplasm of right lung: Secondary | ICD-10-CM | POA: Diagnosis not present

## 2023-03-28 DIAGNOSIS — E041 Nontoxic single thyroid nodule: Secondary | ICD-10-CM | POA: Insufficient documentation

## 2023-03-28 DIAGNOSIS — Z801 Family history of malignant neoplasm of trachea, bronchus and lung: Secondary | ICD-10-CM | POA: Diagnosis not present

## 2023-03-28 DIAGNOSIS — C7802 Secondary malignant neoplasm of left lung: Secondary | ICD-10-CM | POA: Diagnosis not present

## 2023-03-28 DIAGNOSIS — Z1721 Progesterone receptor positive status: Secondary | ICD-10-CM | POA: Insufficient documentation

## 2023-03-28 DIAGNOSIS — Z90710 Acquired absence of both cervix and uterus: Secondary | ICD-10-CM | POA: Diagnosis not present

## 2023-03-28 DIAGNOSIS — M79602 Pain in left arm: Secondary | ICD-10-CM | POA: Diagnosis not present

## 2023-03-28 DIAGNOSIS — J984 Other disorders of lung: Secondary | ICD-10-CM | POA: Insufficient documentation

## 2023-03-28 DIAGNOSIS — Z803 Family history of malignant neoplasm of breast: Secondary | ICD-10-CM | POA: Diagnosis not present

## 2023-03-28 DIAGNOSIS — M79601 Pain in right arm: Secondary | ICD-10-CM | POA: Diagnosis not present

## 2023-03-28 DIAGNOSIS — Z923 Personal history of irradiation: Secondary | ICD-10-CM | POA: Insufficient documentation

## 2023-03-28 DIAGNOSIS — C7800 Secondary malignant neoplasm of unspecified lung: Secondary | ICD-10-CM

## 2023-03-28 DIAGNOSIS — C50411 Malignant neoplasm of upper-outer quadrant of right female breast: Secondary | ICD-10-CM | POA: Diagnosis not present

## 2023-03-28 DIAGNOSIS — Z8542 Personal history of malignant neoplasm of other parts of uterus: Secondary | ICD-10-CM | POA: Diagnosis not present

## 2023-03-28 DIAGNOSIS — Z79811 Long term (current) use of aromatase inhibitors: Secondary | ICD-10-CM | POA: Diagnosis not present

## 2023-03-28 DIAGNOSIS — E278 Other specified disorders of adrenal gland: Secondary | ICD-10-CM | POA: Insufficient documentation

## 2023-03-28 LAB — CBC WITH DIFFERENTIAL (CANCER CENTER ONLY)
Abs Immature Granulocytes: 0 10*3/uL (ref 0.00–0.07)
Basophils Absolute: 0 10*3/uL (ref 0.0–0.1)
Basophils Relative: 1 %
Eosinophils Absolute: 0 10*3/uL (ref 0.0–0.5)
Eosinophils Relative: 1 %
HCT: 36.6 % (ref 36.0–46.0)
Hemoglobin: 13 g/dL (ref 12.0–15.0)
Immature Granulocytes: 0 %
Lymphocytes Relative: 36 %
Lymphs Abs: 0.8 10*3/uL (ref 0.7–4.0)
MCH: 33.8 pg (ref 26.0–34.0)
MCHC: 35.5 g/dL (ref 30.0–36.0)
MCV: 95.1 fL (ref 80.0–100.0)
Monocytes Absolute: 0.2 10*3/uL (ref 0.1–1.0)
Monocytes Relative: 9 %
Neutro Abs: 1.2 10*3/uL — ABNORMAL LOW (ref 1.7–7.7)
Neutrophils Relative %: 53 %
Platelet Count: 185 10*3/uL (ref 150–400)
RBC: 3.85 MIL/uL — ABNORMAL LOW (ref 3.87–5.11)
RDW: 14.1 % (ref 11.5–15.5)
WBC Count: 2.1 10*3/uL — ABNORMAL LOW (ref 4.0–10.5)
nRBC: 0 % (ref 0.0–0.2)

## 2023-03-28 LAB — CMP (CANCER CENTER ONLY)
ALT: 12 U/L (ref 0–44)
AST: 11 U/L — ABNORMAL LOW (ref 15–41)
Albumin: 4.3 g/dL (ref 3.5–5.0)
Alkaline Phosphatase: 55 U/L (ref 38–126)
Anion gap: 7 (ref 5–15)
BUN: 17 mg/dL (ref 8–23)
CO2: 27 mmol/L (ref 22–32)
Calcium: 9.5 mg/dL (ref 8.9–10.3)
Chloride: 105 mmol/L (ref 98–111)
Creatinine: 0.82 mg/dL (ref 0.44–1.00)
GFR, Estimated: >60 mL/min (ref >60)
Glucose, Bld: 157 mg/dL — ABNORMAL HIGH (ref 70–99)
Potassium: 4.3 mmol/L (ref 3.5–5.1)
Sodium: 139 mmol/L (ref 135–145)
Total Bilirubin: 0.6 mg/dL (ref 0.3–1.2)
Total Protein: 7 g/dL (ref 6.5–8.1)

## 2023-03-28 NOTE — Progress Notes (Signed)
[facility]  [phone] [phone]     ID: [name] DOB: 29-Jul-1945  MR#: [id number]  ZDG#:[id number]  Patient Care Team: Myrlene Broker, MD as PCP - General (Internal Medicine) Ovidio Kin, MD as Consulting Physician (General Surgery) Dorothy Puffer, MD as Consulting Physician (Radiation Oncology) Carrington Clamp, MD as Consulting Physician (Obstetrics and Gynecology) Belva Chimes, MD as Referring Physician (Specialist) Leslye Peer, MD as Consulting Physician (Pulmonary Disease) Berneice Heinrich Delbert Phenix., MD as Consulting Physician (Urology) Loraine Grip, Harrietta Guardian, MD as Referring Physician (Ophthalmology) Anselm Lis, RPH-CPP (Pharmacist) Augustin Schooling, MD as Consulting Physician (Ophthalmology) Rachel Moulds, MD as Consulting Physician (Hematology and Oncology)  CHIEF COMPLAINT: Estrogen receptor positive breast cancer  CURRENT TREATMENT: Letrozole, palbociclib  INTERVAL HISTORY:  Maytte returns today for follow-up of her estrogen receptor positive stage IV breast cancer.    The patient, with a history of cancer currently managed with Ibrance and Letrozole, presents with new symptoms of bilateral arm pain and choking episodes. The arm pain is described as coming from under the arms and radiating to both breasts, not simultaneously but affecting both sides. The pain is described as worsening and more frequent. The patient also reports episodes of choking, particularly when swallowing, leading to bouts of coughing and occasional vomiting.  The patient's back pain, previously a significant issue, has improved significantly following exercises recommended by the Psychologist, educational. The patient also has a thyroid nodule, which has remained stable at 2.1 cm, and does not believe it is related to the choking episodes.  The patient is also concerned about the difficulty in drawing blood due to poor vein condition, which is required for  regular monitoring due to the patient's cancer treatment.  Rest of the pertinent 10 point ROS reviewed and negative.  We are continuing to follow her tumor marker: Lab Results  Component Value Date   CA2729 26.1 09/14/2022   CA2729 28.7 06/21/2022   CA2729 31.6 03/30/2022   CA2729 34.1 01/10/2022   CA2729 35.5 09/28/2021    REVIEW OF SYSTEMS:   A detailed review of systems today was otherwise stable.   COVID 19 VACCINATION STATUS: Status post Pfizer x2 followed by booster August 2021; had COVID November 2022   BREAST CANCER HISTORY: From the original intake note:  Khala had screening mammography showing some suspicious calcifications in the right breast leading to right diagnostic mammography with ultrasonography 01/22/2016 at [facility]. The breast density was category C. In the upper right breast there was a 2.3 cm mass with additional masses measuring 0.9 and 0.7 cm. There was also a possible additional 0.8 mass in the lower inner quadrant. Ultrasound confirmed an irregular hypoechoic mass in the right breast upper outer quadrant measuring 2.0 cm. There were other masses measuring 0.7 and 0.8 cm by ultrasonography. The right axilla was sonographically benign.  Biopsy of a 12:00 and 4:00 mass in the right breast 01/22/2016 showed ([id number]) both specimens showing invasive ductal carcinoma, grade 1 or 2, both 95% estrogen receptor positive, both 95% progesterone receptor positive, both with strong staining intensity, with MIB-1 ranging from 10-15%, and both HER-2 negative, the signals ratio being 1.23-1.42, and the number per cell 1.85-2.59.  Her subsequent history is as detailed below   PAST MEDICAL HISTORY: Past Medical History:  Diagnosis Date   Breast cancer (HCC)    Cancer (HCC) 02/2016   right breast   DIABETES MELLITUS, TYPE II 01/04/2007   only takes actoplus daily  Dizziness and giddiness 02/29/2008   DVT, HX OF    at age 48 in right buttocks   Dyspnea    due to lung  cancer   Family history of breast cancer    GERD 01/04/2007   pt reports resolved    GLAUCOMA 07/30/2008   both eyes   History of blood transfusion    no abnormal  reaction   History of uterine cancer 2000   hysterectomy done   HYPERLIPIDEMIA 01/04/2007   taking Pravastatin daily   HYPERTENSION 01/04/2007   takes Lisinopril daily   Joint pain    Joint swelling    Leg cramps    LEG PAIN, LEFT 07/06/2007   NUMBNESS 07/30/2008   in fingers;pt states from Diamox   OSTEOARTHRITIS, HIP 09/25/2009   OTITIS MEDIA, ACUTE, BILATERAL 02/29/2008   Overweight(278.02) 01/04/2007   Peripheral vascular disease (HCC)    Personal history of radiation therapy 2018   Pneumonia    PONV (postoperative nausea and vomiting)    SLEEP APNEA, OBSTRUCTIVE    doesn't use a cpap;study done about 49yrs ago   TRANSIENT ISCHEMIC ATTACK, HX OF 01/04/2007   Vision loss    left eye    PAST SURGICAL HISTORY: Past Surgical History:  Procedure Laterality Date   ABDOMINAL HYSTERECTOMY  2000   BREAST LUMPECTOMY Right 01/13/2017   x2   BREAST LUMPECTOMY WITH RADIOACTIVE SEED AND SENTINEL LYMPH NODE BIOPSY Right 01/13/2017   Procedure: RIGHT BREAST RADIOACTIVE SEED X'S 2 GUIDED LUMPECTOMY WITH RADIOACTIVE SEED TARGETED AXILLARYLYMPH NODE EXCISION AND RIGHT AXILLARY SENTINEL LYMPH NODE BIOPSY;  Surgeon: Ovidio Kin, MD;  Location: [facility] OR;  Service: General;  Laterality: Right;  2 SEEDS IN RIGHT BREAST 1 SEED IN RIGHT AXILLARY NODE   CHOLECYSTECTOMY     ENDOBRONCHIAL ULTRASOUND Bilateral 03/28/2016   Procedure: ENDOBRONCHIAL ULTRASOUND;  Surgeon: Leslye Peer, MD;  Location: [facility];  Service: Cardiopulmonary;  Laterality: Bilateral;   EYE SURGERY  13   shunt left and lazer eye surgery on right cataract and retenia tear with repair   growth removal  2004   from thumb   KNEE ARTHROSCOPY Right    mulitple eye surgeries     both eyes, cataracts with ioc done both eyes   OOPHORECTOMY     right lumpectomy with  axillary node dissection Right 01/2017   TOTAL HIP ARTHROPLASTY  06/24/2011   Procedure: TOTAL HIP ARTHROPLASTY;  Surgeon: Nestor Lewandowsky;  Location: [facility] OR;  Service: Orthopedics;  Laterality: Right;   TOTAL HIP ARTHROPLASTY Left 11/12/2012   Dr Turner Daniels   TOTAL HIP ARTHROPLASTY Left 11/12/2012   Procedure: TOTAL HIP ARTHROPLASTY;  Surgeon: Nestor Lewandowsky, MD;  Location: [facility] OR;  Service: Orthopedics;  Laterality: Left;  DEPUY PINNACLE    FAMILY HISTORY Family History  Problem Relation Age of Onset   Breast cancer Mother 89   Dementia Mother    Cancer Mother        Breast and lung cancer   Stroke Sister    Breast cancer Sister 65   Heart attack Maternal Aunt    Lung cancer Maternal Grandmother        non smoker   Glaucoma Maternal Grandfather    Anesthesia problems Neg Hx   The patient's father died at age 52, the patient's mother died at age 30. She had breast and lung cancers diagnosed shortly before her death. The patient had no brothers, 2 sisters. One sister was diagnosed with breast cancer at the  age of 11.   GYNECOLOGIC HISTORY:  No LMP recorded. Patient has had a hysterectomy. Menarche age 58, first live birth age 16, the patient is GX P1. She had a hysterectomy for endometrial cancer in the year 2000. She did not take hormone replacement. She did use oral contraceptives for more than 20 years remotely, with no complications.   SOCIAL HISTORY: (Updated July 2021). 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 She is home with her husband Maisie Fus. He is a retired Curator.Their son Leonette Most also lives in [place].  The patient has 3 grandchildren aged 35, 10 and 32    ADVANCED DIRECTIVES: In place   HEALTH MAINTENANCE: Social History   Tobacco Use   Smoking status: Never   Smokeless tobacco: Never  Vaping Use   Vaping status: Never Used  Substance Use Topics   Alcohol use: No   Drug use: No     Colonoscopy:  Never  PAP: Status post hysterectomy  Bone density: Remote   Allergies  Allergen Reactions   Codeine Hives    Hycodan syrup   Fluorescein Nausea And Vomiting    ? IV dye for retina specialist   Lipitor [Atorvastatin Calcium]     Leg cramp   Oxycodone Nausea And Vomiting    Patient vomited for 3 days after taking   Sitagliptin Phosphate Nausea And Vomiting   Sulfa Drugs Cross Reactors Nausea And Vomiting    Current Outpatient Medications  Medication Sig Dispense Refill   acetaminophen (TYLENOL) 500 MG tablet Take 1,000 mg by mouth every 4 (four) hours as needed for moderate pain or fever.     aspirin EC 81 MG tablet Take 81 mg by mouth daily at 6 PM. 1700     Biotin 1 MG CAPS Take by mouth.     brimonidine (ALPHAGAN) 0.2 % ophthalmic solution INSTILL 1 DROP INTO EACH EYE THREE TIMES DAILY     cholecalciferol (VITAMIN D) 1000 units tablet Take 1,000 Units by mouth daily.     dorzolamide-timolol (COSOPT) 22.3-6.8 MG/ML ophthalmic solution Place 1 drop into both eyes 2 (two) times daily.     gabapentin (NEURONTIN) 100 MG capsule Take 200 mg at bedtime, can increase to 300 mg at bedtime if tolerated 90 capsule 3   ketorolac (ACULAR) 0.5 % ophthalmic solution Place 1 drop into the right eye 2 (two) times daily.     letrozole (FEMARA) 2.5 MG tablet Take 1 tablet (2.5 mg total) by mouth daily. 90 tablet 3   lisinopril (ZESTRIL) 5 MG tablet Take 1 tablet (5 mg total) by mouth daily. 90 tablet 3   lovastatin (MEVACOR) 20 MG tablet Take 1 tablet (20 mg total) by mouth at bedtime. 90 tablet 3   meloxicam (MOBIC) 15 MG tablet Take 1 tablet (15 mg total) by mouth daily. Take with food 30 tablet 0   Netarsudil-Latanoprost (ROCKLATAN) 0.02-0.005 % SOLN Apply to eye.     palbociclib (IBRANCE) 75 MG tablet Take 1 tablet (75 mg total) by mouth daily. Take for 21 days on, 7 days off, repeat every 28 days. 21 tablet 4   pioglitazone-metformin (ACTOPLUS MET) 15-850 MG tablet Take 1 tablet by mouth daily.  90 tablet 3   prednisoLONE acetate (PRED FORTE) 1 % ophthalmic suspension SMARTSIG:1 In Eye(s) 6 Times Daily     No current facility-administered medications for this visit.     OBJECTIVE: white woman in no acute distress  Vitals:   03/28/23 1419  BP: (!) 192/71  Pulse: 67  Resp: 18  Temp: (!) 97.2 F (36.2 C)  SpO2: 100%      Wt Readings from Last 3 Encounters:  03/28/23 229 lb (103.9 kg)  02/06/23 225 lb (102.1 kg)  01/06/23 231 lb (104.8 kg)   Body mass index is 38.11 kg/m.    ECOG FS:1 - Symptomatic but completely ambulatory No change in physical exam since her last visit here Physical Exam Constitutional:      Appearance: Normal appearance.  Cardiovascular:     Rate and Rhythm: Normal rate and regular rhythm.     Pulses: Normal pulses.     Heart sounds: Normal heart sounds.  Pulmonary:     Effort: Pulmonary effort is normal.     Breath sounds: Normal breath sounds.  Chest:     Comments: Her right chest wall with taut muscle.  Otherwise no palpable masses or regional adenopathy.  Left chest wall and breast normal to inspection and palpation Musculoskeletal:        General: No swelling. Normal range of motion.     Cervical back: Normal range of motion and neck supple. No rigidity.  Lymphadenopathy:     Cervical: No cervical adenopathy.  Skin:    General: Skin is warm and dry.  Neurological:     General: No focal deficit present.     Mental Status: She is alert.  Psychiatric:        Mood and Affect: Mood normal.      LAB RESULTS:  CMP     Component Value Date/Time   NA 138 09/14/2022 1228   NA 140 05/29/2017 1254   K 4.3 09/14/2022 1228   K 4.4 05/29/2017 1254   CL 106 09/14/2022 1228   CO2 26 09/14/2022 1228   CO2 25 05/29/2017 1254   GLUCOSE 170 (H) 09/14/2022 1228   GLUCOSE 154 (H) 05/29/2017 1254   BUN 22 09/14/2022 1228   BUN 11.5 05/29/2017 1254   CREATININE 0.50 01/10/2023 1630   CREATININE 0.77 09/14/2022 1228   CREATININE 0.8  05/29/2017 1254   CALCIUM 9.4 09/14/2022 1228   CALCIUM 9.3 05/29/2017 1254   PROT 7.2 09/14/2022 1228   PROT 7.0 05/29/2017 1254   ALBUMIN 4.3 09/14/2022 1228   ALBUMIN 4.1 05/29/2017 1254   AST 11 (L) 09/14/2022 1228   AST 13 05/29/2017 1254   ALT 11 09/14/2022 1228   ALT 14 05/29/2017 1254   ALKPHOS 61 09/14/2022 1228   ALKPHOS 66 05/29/2017 1254   BILITOT 0.5 09/14/2022 1228   BILITOT 0.50 05/29/2017 1254   GFRNONAA >60 09/14/2022 1228   GFRAA >60 03/10/2020 1138    INo results found for: "SPEP", "UPEP"  Lab Results  Component Value Date   WBC 2.1 (L) 03/28/2023   NEUTROABS 1.2 (L) 03/28/2023   HGB 13.0 03/28/2023   HCT 36.6 03/28/2023   MCV 95.1 03/28/2023   PLT 185 03/28/2023      Chemistry      Component Value Date/Time   NA 138 09/14/2022 1228   NA 140 05/29/2017 1254   K 4.3 09/14/2022 1228   K 4.4 05/29/2017 1254   CL 106 09/14/2022 1228   CO2 26 09/14/2022 1228   CO2 25 05/29/2017 1254   BUN 22 09/14/2022 1228   BUN 11.5 05/29/2017 1254   CREATININE 0.50 01/10/2023 1630   CREATININE 0.77 09/14/2022 1228   CREATININE 0.8 05/29/2017 1254  Component Value Date/Time   CALCIUM 9.4 09/14/2022 1228   CALCIUM 9.3 05/29/2017 1254   ALKPHOS 61 09/14/2022 1228   ALKPHOS 66 05/29/2017 1254   AST 11 (L) 09/14/2022 1228   AST 13 05/29/2017 1254   ALT 11 09/14/2022 1228   ALT 14 05/29/2017 1254   BILITOT 0.5 09/14/2022 1228   BILITOT 0.50 05/29/2017 1254       No results found for: "LABCA2"  No components found for: "LABCA125"  No results for input(s): "INR" in the last 168 hours.  Urinalysis    Component Value Date/Time   COLORURINE YELLOW 11/25/2022 1005   APPEARANCEUR CLEAR 11/25/2022 1005   LABSPEC 1.025 11/25/2022 1005   PHURINE 6.0 11/25/2022 1005   GLUCOSEU NEGATIVE 11/25/2022 1005   HGBUR NEGATIVE 11/25/2022 1005   BILIRUBINUR NEGATIVE 11/25/2022 1005   KETONESUR NEGATIVE 11/25/2022 1005   PROTEINUR 30 (A) 08/18/2017 1322    UROBILINOGEN 0.2 11/25/2022 1005   NITRITE POSITIVE (A) 11/25/2022 1005   LEUKOCYTESUR MODERATE (A) 11/25/2022 1005    STUDIES: No results found.   ELIGIBLE FOR AVAILABLE RESEARCH PROTOCOL: no  ASSESSMENT: 77 y.o. [place] woman with a remote history of early stage endometrial cancer, subsequently status post right breast upper outer quadrant biopsy 01/22/2016 for a clinically multifocal T2 N0, stage 2A invasive ductal carcinoma, grade 1, estrogen and progesterone receptor positive, HER-2 negative, with an MIB-1 between 10 and 15%.  (1) right axillary lymph node biopsy 03/02/2016 positive  (2) genetics testing 01/13/2016 through the Custom gene panel offered by [facility] found no deleterious mutations in  ATM, BARD1, BRCA1, BRCA2, BRIP1, CDH1, CHEK2, EPCAM, FANCC, MLH1, MSH2, MSH6, MUTYH, NBN, PALB2, PMS2, POLD1, PTEN, RAD51C, RAD51D, TP53, and XRCC2  METASTATIC DISEASE: OCT 2017 (3) CT scans of the chest abdomen and pelvis obtained 03/10/2016 are consistent with bilateral lung metastases and mediastinal and hilar nodal involvement, but no liver or bone spread  (a) bronchoscopic lymph node biopsy 2 (station 7, 13R) 03/28/2016 confirms metastatic adenocarcinoma, estrogen receptor positive, HER-2 not amplified  (b) baseline CA-27-29 on 04/18/2016 was 137.5.  (4) letrozole started 03/15/2016, palbociclib added 03/29/2016 at 125 mg/day, 21/7  (a) dose decreased to 100 mg per day, 21/7, beginning with February cycle  (b) palbociclib held 01/31/2017, with increasing symptoms  (c) palbociclib resumed October 2018 at 75 mg daily  (d) palbociclib dose reduced to 75 mg every other day February through April 2019  (e) palbociclib dose resumed at 75 mg daily as of 10/17/2017  (f) palbociclib dose decreased to 75 mg every other day beginning 07/31/2019  (g) palbociclib resumed at 75 mg daily, 21 days on 7 off, as of 08/25/2019  (5) status post double right lumpectomies and right axillary lymph  node sampling 01/13/2017 for 2 separate invasive ductal carcinoma lesions, pT1a and pT1b, N1a, with negative margins, both lesions being estrogen and progesterone receptor positive and HER-2 negative  (6) adjuvant radiation completed 07/05/2017 1. 50.4 Gy in 28 fractions to the right breast and supraclavicular region using whole-breast tangent fields. 2. Boost to the seroma delivered an additional 10 Gy in 5 fractions. The total dose was 60.4 Gy.  (7) restaging studies:  (a) CT scan of the chest and bone scan 06/23/2017 showed stable scattered very small lung nodules, no bone lesions  (b) CT of the chest 10/17/2017 showed no new or progressive metastatic disease in the chest. The small left lower lobe pulmonary nodule is stable  (c) PET scan on 03/02/2018: shows no findings for  residual or recurrent right breast cancer  (d) chest CT scan stable, questionable right renal cyst noted  (e) chest CT 09/18/2020 shows no measurable disease  (f) to the CT scan 04/29/2021 shows no evidence of active disease; a 2 cm thyroid nodule was noted   (8) right upper pole renal lesion noted to be enlarging on CT scan 06/22/2017  (a) no uptake on PET scan obtained 03/02/2018  #9 Most recent imaging with no new suspicious mass or lymphadenopathy identified in the chest abdomen or pelvis.  Stable chronic pleural and parenchymal scarring densities in the right lung apex.  Stable chronic moderate to severe biliary ductal dilatation.  Stable chronic 11 mm left adrenal gland nodule.  No dedicated follow-up required   PLAN:  She continues on Ibrance 75 mg once daily 3 weeks on 1 week off and letrozole daily.    Bilateral Arm Pain New onset of bilateral arm pain, worse on the right side. Pain appears to be musculoskeletal in nature, possibly due to muscle tension. -Recommend arm exercises to improve mobility and reduce muscle tension.  - Will send exercise sheet via email.  Dysphagia New onset of choking and  coughing with swallowing, not associated with thyroid nodule. -Continue monitoring symptoms.  Thyroid Nodule Stable 2.1 cm thyroid nodule, not causing dysphagia. Ultrasound ordered for follow-up. -Complete scheduled ultrasound.  Breast Cancer Stable on Ibrance and Letrozole, no new symptoms. Mammogram in May was normal. -Continue Ibrance and Letrozole. -Next mammogram due in May 2025. -Next CT scan due in Feb 2025.  Patient does not want to do scans more often than every 6 months.  Bulging Disc Improvement in back pain with exercises. -Continue with exercises at home.  General Health Maintenance -Continue monitoring blood pressure. -Continue routine blood work every 3 months. -Follow-up in 3 months, with a plan to extend to 4 months after next scan. Total time spent: 30 min  Thank you for consulting [place] in the care of this patient.  Please not hesitate contact us with any additional questions or concerns.  *Total Encounter Time as defined by the Centers for Medicare and Medicaid Services includes, in addition to the face-to-face time of a patient visit (documented in the note above) non-face-to-face time: obtaining and reviewing outside history, ordering and reviewing medications, tests or procedures, care coordination (communications with other health care professionals or caregivers) and documentation in the medical record.  Rachel Moulds MD

## 2023-03-29 ENCOUNTER — Telehealth: Payer: Self-pay | Admitting: Hematology and Oncology

## 2023-03-29 LAB — CANCER ANTIGEN 27.29: CA 27.29: 33.2 U/mL (ref 0.0–38.6)

## 2023-03-29 NOTE — Telephone Encounter (Signed)
Spoke with patient confirming upcoming appointments  

## 2023-03-30 ENCOUNTER — Encounter: Payer: Self-pay | Admitting: Rehabilitation

## 2023-04-05 ENCOUNTER — Telehealth: Payer: Self-pay | Admitting: Pharmacy Technician

## 2023-04-05 NOTE — Telephone Encounter (Signed)
Oral Oncology Patient Advocate Encounter   Received notification that patient is due for re-enrollment for assistance for Ibrance through [employer].   Re-enrollment process has been initiated and will be submitted upon completion of necessary documents.   [employer] phone number [phone]   I will continue to follow until final determination.  Zeinab Rodwell, CPhT-Adv Oncology Pharmacy Patient Advocate [facility] Direct Number: [phone]  Fax: [phone]   

## 2023-04-05 NOTE — Telephone Encounter (Signed)
Oral Oncology Patient Advocate Encounter  Patient stopped by lobby and signed forms. Application is now pending MD signatures  Jinger Neighbors, CPhT-Adv Oncology Pharmacy Patient Advocate [facility] Direct Number: [phone]  Fax: [phone]

## 2023-04-18 ENCOUNTER — Other Ambulatory Visit (HOSPITAL_COMMUNITY): Payer: Self-pay

## 2023-04-18 NOTE — Telephone Encounter (Signed)
Oral Oncology Patient Advocate Encounter   Submitted application for assistance for Ibrance to [employer].   Application submitted via e-fax to [phone]   [employer] phone number [phone].   I will continue to check the status until final determination.   Betrice Wanat, CPhT-Adv Oncology Pharmacy Patient Advocate [facility] Direct Number: [phone]  Fax: [phone]   

## 2023-05-24 DIAGNOSIS — H35353 Cystoid macular degeneration, bilateral: Secondary | ICD-10-CM | POA: Diagnosis not present

## 2023-06-03 ENCOUNTER — Other Ambulatory Visit (HOSPITAL_BASED_OUTPATIENT_CLINIC_OR_DEPARTMENT_OTHER): Payer: Self-pay

## 2023-06-13 ENCOUNTER — Other Ambulatory Visit (HOSPITAL_BASED_OUTPATIENT_CLINIC_OR_DEPARTMENT_OTHER): Payer: Self-pay

## 2023-06-16 ENCOUNTER — Telehealth: Payer: Self-pay | Admitting: Pharmacy Technician

## 2023-06-16 NOTE — Telephone Encounter (Signed)
 Oral Oncology Patient Advocate Encounter  Returned call to patient and discussed changes to [facility] payment plan and how that can affect her Pfizer assistance.  Estefana Moellers, CPhT-Adv Oncology Pharmacy Patient Advocate [facility] Direct Number: [phone]  Fax: [phone]

## 2023-06-16 NOTE — Telephone Encounter (Signed)
 Oral Oncology Patient Advocate Encounter  Was successful in securing patient a $15,000 grant from [name] to provide copayment coverage for Ibrance .  This will keep the out of pocket expense at $0.     [employer] ID: [id number]  I have spoken with the patient.   The billing information is as follows and has been shared with [facility].    RxBin: [id number] PCN: [id number] Member ID: [id number] Group ID: [id number] Dates of Eligibility: 05/25/23 through 05/23/24  Fund:  Breast  [name], CPhT-Adv Oncology Pharmacy Patient Advocate [facility] Direct Number: [phone]  Fax: [phone]

## 2023-06-23 ENCOUNTER — Other Ambulatory Visit: Payer: Self-pay | Admitting: *Deleted

## 2023-06-23 DIAGNOSIS — C7800 Secondary malignant neoplasm of unspecified lung: Secondary | ICD-10-CM

## 2023-06-23 DIAGNOSIS — Z170 Estrogen receptor positive status [ER+]: Secondary | ICD-10-CM

## 2023-06-26 ENCOUNTER — Inpatient Hospital Stay: Payer: PPO

## 2023-06-26 ENCOUNTER — Inpatient Hospital Stay: Payer: PPO | Attending: Hematology and Oncology | Admitting: Hematology and Oncology

## 2023-06-26 ENCOUNTER — Encounter: Payer: Self-pay | Admitting: Hematology and Oncology

## 2023-06-26 VITALS — BP 185/68 | HR 85 | Temp 97.8°F | Resp 18 | Wt 233.4 lb

## 2023-06-26 DIAGNOSIS — Z90710 Acquired absence of both cervix and uterus: Secondary | ICD-10-CM | POA: Insufficient documentation

## 2023-06-26 DIAGNOSIS — Z801 Family history of malignant neoplasm of trachea, bronchus and lung: Secondary | ICD-10-CM | POA: Diagnosis not present

## 2023-06-26 DIAGNOSIS — Z1721 Progesterone receptor positive status: Secondary | ICD-10-CM | POA: Insufficient documentation

## 2023-06-26 DIAGNOSIS — C7800 Secondary malignant neoplasm of unspecified lung: Secondary | ICD-10-CM

## 2023-06-26 DIAGNOSIS — Z1732 Human epidermal growth factor receptor 2 negative status: Secondary | ICD-10-CM | POA: Diagnosis not present

## 2023-06-26 DIAGNOSIS — Z8542 Personal history of malignant neoplasm of other parts of uterus: Secondary | ICD-10-CM | POA: Diagnosis not present

## 2023-06-26 DIAGNOSIS — Z803 Family history of malignant neoplasm of breast: Secondary | ICD-10-CM | POA: Insufficient documentation

## 2023-06-26 DIAGNOSIS — Z79811 Long term (current) use of aromatase inhibitors: Secondary | ICD-10-CM | POA: Diagnosis not present

## 2023-06-26 DIAGNOSIS — C50411 Malignant neoplasm of upper-outer quadrant of right female breast: Secondary | ICD-10-CM | POA: Diagnosis not present

## 2023-06-26 DIAGNOSIS — Z170 Estrogen receptor positive status [ER+]: Secondary | ICD-10-CM

## 2023-06-26 LAB — CBC WITH DIFFERENTIAL (CANCER CENTER ONLY)
Abs Immature Granulocytes: 0.02 10*3/uL (ref 0.00–0.07)
Basophils Absolute: 0 10*3/uL (ref 0.0–0.1)
Basophils Relative: 1 %
Eosinophils Absolute: 0 10*3/uL (ref 0.0–0.5)
Eosinophils Relative: 2 %
HCT: 36.4 % (ref 36.0–46.0)
Hemoglobin: 12.7 g/dL (ref 12.0–15.0)
Immature Granulocytes: 1 %
Lymphocytes Relative: 30 %
Lymphs Abs: 0.6 10*3/uL — ABNORMAL LOW (ref 0.7–4.0)
MCH: 32.6 pg (ref 26.0–34.0)
MCHC: 34.9 g/dL (ref 30.0–36.0)
MCV: 93.3 fL (ref 80.0–100.0)
Monocytes Absolute: 0.2 10*3/uL (ref 0.1–1.0)
Monocytes Relative: 10 %
Neutro Abs: 1.1 10*3/uL — ABNORMAL LOW (ref 1.7–7.7)
Neutrophils Relative %: 56 %
Platelet Count: 156 10*3/uL (ref 150–400)
RBC: 3.9 MIL/uL (ref 3.87–5.11)
RDW: 14.2 % (ref 11.5–15.5)
WBC Count: 2 10*3/uL — ABNORMAL LOW (ref 4.0–10.5)
nRBC: 0 % (ref 0.0–0.2)

## 2023-06-26 LAB — CMP (CANCER CENTER ONLY)
ALT: 12 U/L (ref 0–44)
AST: 12 U/L — ABNORMAL LOW (ref 15–41)
Albumin: 4.3 g/dL (ref 3.5–5.0)
Alkaline Phosphatase: 66 U/L (ref 38–126)
Anion gap: 6 (ref 5–15)
BUN: 19 mg/dL (ref 8–23)
CO2: 27 mmol/L (ref 22–32)
Calcium: 9.4 mg/dL (ref 8.9–10.3)
Chloride: 103 mmol/L (ref 98–111)
Creatinine: 0.79 mg/dL (ref 0.44–1.00)
GFR, Estimated: >60 mL/min (ref >60)
Glucose, Bld: 240 mg/dL — ABNORMAL HIGH (ref 70–99)
Potassium: 4.4 mmol/L (ref 3.5–5.1)
Sodium: 136 mmol/L (ref 135–145)
Total Bilirubin: 0.5 mg/dL (ref 0.0–1.2)
Total Protein: 7.1 g/dL (ref 6.5–8.1)

## 2023-06-26 MED ORDER — PALBOCICLIB 75 MG PO TABS
75.0000 mg | ORAL_TABLET | Freq: Every day | ORAL | 11 refills | Status: DC
  Filled 2023-06-28: qty 21, 28d supply, fill #0
  Filled 2023-08-04: qty 21, 28d supply, fill #1
  Filled 2023-08-23 (×2): qty 21, 28d supply, fill #2
  Filled 2023-09-29: qty 21, 28d supply, fill #3
  Filled 2023-10-31: qty 21, 28d supply, fill #4
  Filled 2023-11-24: qty 21, 28d supply, fill #5
  Filled 2023-12-25: qty 21, 28d supply, fill #6
  Filled 2024-01-23: qty 21, 28d supply, fill #7
  Filled 2024-02-20: qty 21, 28d supply, fill #8
  Filled 2024-03-19: qty 21, 28d supply, fill #9
  Filled 2024-04-11: qty 21, 28d supply, fill #10
  Filled 2024-05-08: qty 21, 28d supply, fill #11

## 2023-06-26 NOTE — Progress Notes (Signed)
 [facility]  [phone] [phone]     ID: [name] DOB: Aug 11, 1945  MR#: [id number]  RDW#:[id number]  Patient Care Team: Rollene Almarie LABOR, MD as PCP - General (Internal Medicine) Ethyl Lenis, MD as Consulting Physician (General Surgery) Dewey Rush, MD as Consulting Physician (Radiation Oncology) Sarrah Browning, MD as Consulting Physician (Obstetrics and Gynecology) Marleen Redell SAUNDERS, MD as Referring Physician (Specialist) Shelah Lamar RAMAN, MD as Consulting Physician (Pulmonary Disease) Alvaro Ricardo KATHEE Raddle., MD as Consulting Physician (Urology) Nilsa, Dempsey Pac, MD as Referring Physician (Ophthalmology) Lucila Rush LABOR, RPH-CPP (Pharmacist) Austin Olam CROME, MD as Consulting Physician (Ophthalmology) Loretha Ash, MD as Consulting Physician (Hematology and Oncology)  CHIEF COMPLAINT: Estrogen receptor positive breast cancer  CURRENT TREATMENT: Letrozole , palbociclib   INTERVAL HISTORY:  [name] returns today for follow-up of her estrogen receptor positive stage IV breast cancer.    The patient, with a history of cancer currently managed with Ibrance  and Letrozole .  Since her last visit, she did not have any issues with cough and choking episodes.  She however feels like she has not been exercising very well.  She also feels like she needs to lose more weight.  Overall she thinks she is doing really well.  She is tolerating Ibrance  and letrozole  extremely well.  She would like to move forward with imaging in March since the weather will be warmer.  She denies any change in breathing, bowel habits or urinary habits.  Overall no adverse effects reported today.  Rest of the pertinent 10 point ROS reviewed and negative.  We are continuing to follow her tumor marker: Lab Results  Component Value Date   CA2729 33.2 03/28/2023   CA2729 26.1 09/14/2022   CA2729 28.7 06/21/2022   CA2729 31.6 03/30/2022   CA2729 34.1 01/10/2022    REVIEW  OF SYSTEMS:   A detailed review of systems today was otherwise stable.   COVID 19 VACCINATION STATUS: Status post Pfizer x2 followed by booster August 2021; had COVID November 2022   BREAST CANCER HISTORY: From the original intake note:  [name] had screening mammography showing some suspicious calcifications in the right breast leading to right diagnostic mammography with ultrasonography 01/22/2016 at [facility]. The breast density was category C. In the upper right breast there was a 2.3 cm mass with additional masses measuring 0.9 and 0.7 cm. There was also a possible additional 0.8 mass in the lower inner quadrant. Ultrasound confirmed an irregular hypoechoic mass in the right breast upper outer quadrant measuring 2.0 cm. There were other masses measuring 0.7 and 0.8 cm by ultrasonography. The right axilla was sonographically benign.  Biopsy of a 12:00 and 4:00 mass in the right breast 01/22/2016 showed ([id number]) both specimens showing invasive ductal carcinoma, grade 1 or 2, both 95% estrogen receptor positive, both 95% progesterone receptor positive, both with strong staining intensity, with MIB-1 ranging from 10-15%, and both HER-2 negative, the signals ratio being 1.23-1.42, and the number per cell 1.85-2.59.  Her subsequent history is as detailed below   PAST MEDICAL HISTORY: Past Medical History:  Diagnosis Date   Breast cancer (HCC)    Cancer (HCC) 02/2016   right breast   DIABETES MELLITUS, TYPE II 01/04/2007   only takes actoplus daily   Dizziness and giddiness 02/29/2008   DVT, HX OF    at age 68 in right buttocks   Dyspnea    due to lung cancer   Family history of breast cancer    GERD  01/04/2007   pt reports resolved    GLAUCOMA 07/30/2008   both eyes   History of blood transfusion    no abnormal  reaction   History of uterine cancer 2000   hysterectomy done   HYPERLIPIDEMIA 01/04/2007   taking Pravastatin  daily   HYPERTENSION 01/04/2007   takes Lisinopril  daily    Joint pain    Joint swelling    Leg cramps    LEG PAIN, LEFT 07/06/2007   NUMBNESS 07/30/2008   in fingers;pt states from Diamox    OSTEOARTHRITIS, HIP 09/25/2009   OTITIS MEDIA, ACUTE, BILATERAL 02/29/2008   Overweight(278.02) 01/04/2007   Peripheral vascular disease (HCC)    Personal history of radiation therapy 2018   Pneumonia    PONV (postoperative nausea and vomiting)    SLEEP APNEA, OBSTRUCTIVE    doesn't use a cpap;study done about 27yrs ago   TRANSIENT ISCHEMIC ATTACK, HX OF 01/04/2007   Vision loss    left eye    PAST SURGICAL HISTORY: Past Surgical History:  Procedure Laterality Date   ABDOMINAL HYSTERECTOMY  2000   BREAST LUMPECTOMY Right 01/13/2017   x2   BREAST LUMPECTOMY WITH RADIOACTIVE SEED AND SENTINEL LYMPH NODE BIOPSY Right 01/13/2017   Procedure: RIGHT BREAST RADIOACTIVE SEED X'S 2 GUIDED LUMPECTOMY WITH RADIOACTIVE SEED TARGETED AXILLARYLYMPH NODE EXCISION AND RIGHT AXILLARY SENTINEL LYMPH NODE BIOPSY;  Surgeon: Ethyl Lenis, MD;  Location: [facility] OR;  Service: General;  Laterality: Right;  2 SEEDS IN RIGHT BREAST 1 SEED IN RIGHT AXILLARY NODE   CHOLECYSTECTOMY     ENDOBRONCHIAL ULTRASOUND Bilateral 03/28/2016   Procedure: ENDOBRONCHIAL ULTRASOUND;  Surgeon: Lamar GORMAN Chris, MD;  Location: [facility];  Service: Cardiopulmonary;  Laterality: Bilateral;   EYE SURGERY  13   shunt left and lazer eye surgery on right cataract and retenia tear with repair   growth removal  2004   from thumb   KNEE ARTHROSCOPY Right    mulitple eye surgeries     both eyes, cataracts with ioc done both eyes   OOPHORECTOMY     right lumpectomy with axillary node dissection Right 01/2017   TOTAL HIP ARTHROPLASTY  06/24/2011   Procedure: TOTAL HIP ARTHROPLASTY;  Surgeon: Dempsey JINNY Sensor;  Location: [facility] OR;  Service: Orthopedics;  Laterality: Right;   TOTAL HIP ARTHROPLASTY Left 11/12/2012   Dr Sensor   TOTAL HIP ARTHROPLASTY Left 11/12/2012   Procedure: TOTAL HIP ARTHROPLASTY;  Surgeon: Dempsey JINNY Sensor, MD;  Location: [facility] OR;  Service: Orthopedics;  Laterality: Left;  DEPUY PINNACLE    FAMILY HISTORY Family History  Problem Relation Age of Onset   Breast cancer Mother 15   Dementia Mother    Cancer Mother        Breast and lung cancer   Stroke Sister    Breast cancer Sister 56   Heart attack Maternal Aunt    Lung cancer Maternal Grandmother        non smoker   Glaucoma Maternal Grandfather    Anesthesia problems Neg Hx   The patient's father died at age 13, the patient's mother died at age 75. She had breast and lung cancers diagnosed shortly before her death. The patient had no brothers, 2 sisters. One sister was diagnosed with breast cancer at the age of 35.   GYNECOLOGIC HISTORY:  No LMP recorded. Patient has had a hysterectomy. Menarche age 31, first live birth age 33, the patient is GX P1. She had a hysterectomy for endometrial cancer in the year  2000. She did not take hormone replacement. She did use oral contraceptives for more than 20 years remotely, with no complications.   SOCIAL HISTORY: (Updated July 2021). 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 She is home with her husband Debby. He is a retired curator.Their son Carlin also lives in [place].  The patient has 3 grandchildren aged 64, 76 and 49    ADVANCED DIRECTIVES: In place   HEALTH MAINTENANCE: Social History   Tobacco Use   Smoking status: Never   Smokeless tobacco: Never  Vaping Use   Vaping status: Never Used  Substance Use Topics   Alcohol use: No   Drug use: No     Colonoscopy: Never  PAP: Status post hysterectomy  Bone density: Remote   Allergies  Allergen Reactions   Codeine  Hives    Hycodan syrup   Fluorescein  Nausea And Vomiting    ? IV dye for retina specialist   Lipitor [Atorvastatin  Calcium ]     Leg cramp   Oxycodone  Nausea And Vomiting    Patient vomited for 3 days after taking   Sitagliptin  Phosphate Nausea And Vomiting   Sulfa Drugs Cross Reactors Nausea And Vomiting    Current Outpatient Medications  Medication Sig Dispense Refill   acetaminophen  (TYLENOL ) 500 MG tablet Take 1,000 mg by mouth every 4 (four) hours as needed for moderate pain or fever.     aspirin  EC 81 MG tablet Take 81 mg by mouth daily at 6 PM. 1700     Biotin 1 MG CAPS Take by mouth.     brimonidine  (ALPHAGAN ) 0.2 % ophthalmic solution INSTILL 1 DROP INTO EACH EYE THREE TIMES DAILY     cholecalciferol (VITAMIN D) 1000 units tablet Take 1,000 Units by mouth daily.     dorzolamide-timolol  (COSOPT) 22.3-6.8 MG/ML ophthalmic solution Place 1 drop into both eyes 2 (two) times daily.     gabapentin  (NEURONTIN ) 100 MG capsule Take 200 mg at bedtime, can increase to 300 mg at bedtime if tolerated 90 capsule 3   ketorolac (ACULAR) 0.5 % ophthalmic solution Place 1 drop into the right eye 2 (two) times daily.     letrozole  (FEMARA ) 2.5 MG tablet Take 1 tablet (2.5 mg total) by mouth daily. 90 tablet 3   lisinopril  (ZESTRIL ) 5 MG tablet Take 1 tablet (5 mg total) by mouth daily. 90 tablet 3   lovastatin  (MEVACOR ) 20 MG tablet Take 1 tablet (20 mg total) by mouth at bedtime. 90 tablet 3   meloxicam  (MOBIC ) 15 MG tablet Take 1 tablet (15 mg total) by mouth daily. Take with food 30 tablet 0   Netarsudil-Latanoprost (ROCKLATAN ) 0.02-0.005 % SOLN Apply to eye.     palbociclib  (IBRANCE ) 75 MG tablet Take 1 tablet (75 mg total) by mouth daily. Take for 21 days on, 7 days off, repeat every 28 days. 21 tablet 4   pioglitazone -metformin  (ACTOPLUS MET ) 15-850 MG tablet Take 1 tablet by mouth daily. 90 tablet 3   prednisoLONE  acetate (PRED FORTE ) 1 % ophthalmic suspension SMARTSIG:1 In Eye(s) 6 Times Daily     No current facility-administered medications for this visit.     OBJECTIVE: white woman in no acute distress  Vitals:   06/26/23 1252  BP: (!) 185/68  Pulse: 85  Resp: 18  Temp: 97.8 F (36.6 C)  SpO2: 96%     Wt Readings from Last 3 Encounters:  06/26/23 233 lb 6.4  oz (105.9 kg)  03/28/23 229 lb (103.9 kg)  02/06/23 225 lb (102.1 kg)   Body mass index is 38.84 kg/m.    ECOG FS:1 - Symptomatic but completely ambulatory No change in physical exam since her last visit here Physical Exam Constitutional:      Appearance: Normal appearance.  Cardiovascular:     Rate and Rhythm: Normal rate and regular rhythm.     Pulses: Normal pulses.     Heart sounds: Normal heart sounds.  Pulmonary:     Effort: Pulmonary effort is normal.     Breath sounds: Normal breath sounds.  Musculoskeletal:        General: No swelling. Normal range of motion.     Cervical back: Normal range of motion and neck supple. No rigidity.  Lymphadenopathy:     Cervical: No cervical adenopathy.  Skin:    General: Skin is warm and dry.  Neurological:     General: No focal deficit present.     Mental Status: She is alert.  Psychiatric:        Mood and Affect: Mood normal.      LAB RESULTS:  CMP     Component Value Date/Time   NA 139 03/28/2023 1345   NA 140 05/29/2017 1254   K 4.3 03/28/2023 1345   K 4.4 05/29/2017 1254   CL 105 03/28/2023 1345   CO2 27 03/28/2023 1345   CO2 25 05/29/2017 1254   GLUCOSE 157 (H) 03/28/2023 1345   GLUCOSE 154 (H) 05/29/2017 1254   BUN 17 03/28/2023 1345   BUN 11.5 05/29/2017 1254   CREATININE 0.82 03/28/2023 1345   CREATININE 0.8 05/29/2017 1254   CALCIUM  9.5 03/28/2023 1345   CALCIUM  9.3 05/29/2017 1254   PROT 7.0 03/28/2023 1345   PROT 7.0 05/29/2017 1254   ALBUMIN  4.3 03/28/2023 1345   ALBUMIN  4.1 05/29/2017 1254   AST 11 (L) 03/28/2023 1345   AST 13 05/29/2017 1254   ALT 12 03/28/2023 1345   ALT 14 05/29/2017 1254   ALKPHOS 55 03/28/2023 1345   ALKPHOS 66 05/29/2017 1254   BILITOT 0.6 03/28/2023 1345   BILITOT 0.50 05/29/2017 1254   GFRNONAA >60 03/28/2023 1345   GFRAA >60 03/10/2020 1138    INo results found for: SPEP, UPEP  Lab Results   Component Value Date   WBC 2.0 (L) 06/26/2023   NEUTROABS 1.1 (L) 06/26/2023   HGB 12.7 06/26/2023   HCT 36.4 06/26/2023   MCV 93.3 06/26/2023   PLT 156 06/26/2023      Chemistry      Component Value Date/Time   NA 139 03/28/2023 1345   NA 140 05/29/2017 1254   K 4.3 03/28/2023 1345   K 4.4 05/29/2017 1254   CL 105 03/28/2023 1345   CO2 27 03/28/2023 1345   CO2 25 05/29/2017 1254   BUN 17 03/28/2023 1345   BUN 11.5 05/29/2017 1254   CREATININE 0.82 03/28/2023 1345   CREATININE 0.8 05/29/2017 1254      Component Value Date/Time   CALCIUM  9.5 03/28/2023 1345   CALCIUM  9.3 05/29/2017 1254   ALKPHOS 55 03/28/2023 1345   ALKPHOS 66 05/29/2017 1254   AST 11 (L) 03/28/2023 1345   AST 13 05/29/2017 1254   ALT 12 03/28/2023 1345   ALT 14 05/29/2017 1254   BILITOT 0.6 03/28/2023 1345   BILITOT 0.50 05/29/2017 1254       No results found for: LABCA2  No components found for: [id number]  No  results for input(s): INR in the last 168 hours.  Urinalysis    Component Value Date/Time   COLORURINE YELLOW 11/25/2022 1005   APPEARANCEUR CLEAR 11/25/2022 1005   LABSPEC 1.025 11/25/2022 1005   PHURINE 6.0 11/25/2022 1005   GLUCOSEU NEGATIVE 11/25/2022 1005   HGBUR NEGATIVE 11/25/2022 1005   BILIRUBINUR NEGATIVE 11/25/2022 1005   KETONESUR NEGATIVE 11/25/2022 1005   PROTEINUR 30 (A) 08/18/2017 1322   UROBILINOGEN 0.2 11/25/2022 1005   NITRITE POSITIVE (A) 11/25/2022 1005   LEUKOCYTESUR MODERATE (A) 11/25/2022 1005    STUDIES: No results found.   ELIGIBLE FOR AVAILABLE RESEARCH PROTOCOL: no  ASSESSMENT: 78 y.o. [place] woman with a remote history of early stage endometrial cancer, subsequently status post right breast upper outer quadrant biopsy 01/22/2016 for a clinically multifocal T2 N0, stage 2A invasive ductal carcinoma, grade 1, estrogen and progesterone receptor positive, HER-2 negative, with an MIB-1 between 10 and 15%.  (1) right axillary lymph node  biopsy 03/02/2016 positive  (2) genetics testing 01/13/2016 through the Custom gene panel offered by [facility] found no deleterious mutations in  ATM, BARD1, BRCA1, BRCA2, BRIP1, CDH1, CHEK2, EPCAM, FANCC, MLH1, MSH2, MSH6, MUTYH, NBN, PALB2, PMS2, POLD1, PTEN, RAD51C, RAD51D, TP53, and XRCC2  METASTATIC DISEASE: OCT 2017 (3) CT scans of the chest abdomen and pelvis obtained 03/10/2016 are consistent with bilateral lung metastases and mediastinal and hilar nodal involvement, but no liver or bone spread  (a) bronchoscopic lymph node biopsy 2 (station 7, 13R) 03/28/2016 confirms metastatic adenocarcinoma, estrogen receptor positive, HER-2 not amplified  (b) baseline CA-27-29 on 04/18/2016 was 137.5.  (4) letrozole  started 03/15/2016, palbociclib  added 03/29/2016 at 125 mg/day, 21/7  (a) dose decreased to 100 mg per day, 21/7, beginning with February cycle  (b) palbociclib  held 01/31/2017, with increasing symptoms  (c) palbociclib  resumed October 2018 at 75 mg daily  (d) palbociclib  dose reduced to 75 mg every other day February through April 2019  (e) palbociclib  dose resumed at 75 mg daily as of 10/17/2017  (f) palbociclib  dose decreased to 75 mg every other day beginning 07/31/2019  (g) palbociclib  resumed at 75 mg daily, 21 days on 7 off, as of 08/25/2019  (5) status post double right lumpectomies and right axillary lymph node sampling 01/13/2017 for 2 separate invasive ductal carcinoma lesions, pT1a and pT1b, N1a, with negative margins, both lesions being estrogen and progesterone receptor positive and HER-2 negative  (6) adjuvant radiation completed 07/05/2017 1. 50.4 Gy in 28 fractions to the right breast and supraclavicular region using whole-breast tangent fields. 2. Boost to the seroma delivered an additional 10 Gy in 5 fractions. The total dose was 60.4 Gy.  (7) restaging studies:  (a) CT scan of the chest and bone scan 06/23/2017 showed stable scattered very small lung nodules, no  bone lesions  (b) CT of the chest 10/17/2017 showed no new or progressive metastatic disease in the chest. The small left lower lobe pulmonary nodule is stable  (c) PET scan on 03/02/2018: shows no findings for residual or recurrent right breast cancer  (d) chest CT scan stable, questionable right renal cyst noted  (e) chest CT 09/18/2020 shows no measurable disease  (f) to the CT scan 04/29/2021 shows no evidence of active disease; a 2 cm thyroid  nodule was noted   (8) right upper pole renal lesion noted to be enlarging on CT scan 06/22/2017  (a) no uptake on PET scan obtained 03/02/2018  #9 Most recent imaging with no new suspicious mass or lymphadenopathy  identified in the chest abdomen or pelvis.  Stable chronic pleural and parenchymal scarring densities in the right lung apex.  Stable chronic moderate to severe biliary ductal dilatation.  Stable chronic 11 mm left adrenal gland nodule.  No dedicated follow-up required   PLAN:  She continues on Ibrance  75 mg once daily 3 weeks on 1 week off and letrozole  daily.    Breast Cancer Stable on Ibrance  and Letrozole , no new symptoms. Mammogram in May was normal. -Continue Ibrance  and Letrozole . -Next mammogram due in May 2025. -Next CT scan due in Feb 2025.  Patient does not want to do scans more often than every 6 months.  This has been ordered.  Hair loss, recommended to continue biotin supplement, warm massages and consider Nutrafol trial  General Health Maintenance -Continue routine blood work every 3 months. -Follow-up in 3 months, with a plan to extend to 4 months after next scan.  Total time spent: 30 min  Thank you for consulting us  in the care of this patient.  Please not hesitate contact us  with any additional questions or concerns.  *Total Encounter Time as defined by the Centers for Medicare and Medicaid Services includes, in addition to the face-to-face time of a patient visit (documented in the note above) non-face-to-face  time: obtaining and reviewing outside history, ordering and reviewing medications, tests or procedures, care coordination (communications with other health care professionals or caregivers) and documentation in the medical record.  Amber Stalls MD

## 2023-06-26 NOTE — Telephone Encounter (Signed)
 Oral Oncology Patient Advocate Encounter  Program is requesting letter from Medicare stating patient has enrolled into the rx payment plan for 2025 before they will finish processing the patient's enrollment application. I have requested this from the patient and they will bring it to the office once they find it.  Estefana Moellers, CPhT-Adv Oncology Pharmacy Patient Advocate [facility] Direct Number: [phone]  Fax: [phone]

## 2023-06-27 ENCOUNTER — Other Ambulatory Visit: Payer: Self-pay | Admitting: Hematology and Oncology

## 2023-06-27 ENCOUNTER — Other Ambulatory Visit: Payer: Self-pay | Admitting: Internal Medicine

## 2023-06-27 LAB — CANCER ANTIGEN 27.29: CA 27.29: 35.9 U/mL (ref 0.0–38.6)

## 2023-06-28 ENCOUNTER — Other Ambulatory Visit: Payer: Self-pay | Admitting: Pharmacy Technician

## 2023-06-28 ENCOUNTER — Other Ambulatory Visit: Payer: Self-pay

## 2023-06-28 ENCOUNTER — Other Ambulatory Visit (HOSPITAL_COMMUNITY): Payer: Self-pay

## 2023-06-28 NOTE — Progress Notes (Signed)
 Patient transferring from Patient Assistance Program to [facility]. Patient initially counseled in clinic visit note on 03/22/16.

## 2023-06-28 NOTE — Progress Notes (Signed)
 Specialty Pharmacy Initial Fill Coordination Note  [name] is a 77 y.o. female contacted today regarding refills of specialty medication(s) Palbociclib  (IBRANCE ) .  Patient requested Delivery  on 07/14/23  to verified address [address]   [place] [address]-   Medication will be filled on 07/12/23.   Patient is aware of $0 copayment.

## 2023-06-28 NOTE — Telephone Encounter (Signed)
 Oral Oncology Patient Advocate Encounter  Patient has agreed to switch to filling at [facility]. Application will be cancelled.  Paulette Borrow, CPhT-Adv Oncology Pharmacy Patient Advocate [facility] Direct Number: [phone]  Fax: [phone]

## 2023-06-29 ENCOUNTER — Ambulatory Visit: Payer: PPO | Admitting: Hematology and Oncology

## 2023-07-10 ENCOUNTER — Other Ambulatory Visit (HOSPITAL_COMMUNITY): Payer: Self-pay

## 2023-07-12 ENCOUNTER — Other Ambulatory Visit (HOSPITAL_COMMUNITY): Payer: Self-pay

## 2023-07-20 DIAGNOSIS — H353132 Nonexudative age-related macular degeneration, bilateral, intermediate dry stage: Secondary | ICD-10-CM | POA: Diagnosis not present

## 2023-07-20 DIAGNOSIS — H35353 Cystoid macular degeneration, bilateral: Secondary | ICD-10-CM | POA: Diagnosis not present

## 2023-07-20 DIAGNOSIS — H43813 Vitreous degeneration, bilateral: Secondary | ICD-10-CM | POA: Diagnosis not present

## 2023-07-20 DIAGNOSIS — H20013 Primary iridocyclitis, bilateral: Secondary | ICD-10-CM | POA: Diagnosis not present

## 2023-07-20 DIAGNOSIS — H04123 Dry eye syndrome of bilateral lacrimal glands: Secondary | ICD-10-CM | POA: Diagnosis not present

## 2023-07-21 ENCOUNTER — Other Ambulatory Visit: Payer: Self-pay

## 2023-08-03 ENCOUNTER — Ambulatory Visit: Payer: PPO | Admitting: Internal Medicine

## 2023-08-04 ENCOUNTER — Other Ambulatory Visit: Payer: Self-pay

## 2023-08-04 NOTE — Progress Notes (Signed)
Specialty Pharmacy Refill Coordination Note  [name] is a 78 y.o. female contacted today regarding refills of specialty medication(s) Palbociclib [name])   Patient requested [name] at [facility] at [place] date: 08/09/23   Medication will be filled on 08/08/23.

## 2023-08-04 NOTE — Progress Notes (Signed)
Specialty Pharmacy Ongoing Clinical Assessment Note  [name] is a 78 y.o. female who is being followed by the specialty pharmacy service for RxSp Oncology   Patient's specialty medication(s) reviewed today: Palbociclib [name])  Patient has been on medication. Patient is new to pharmacy.  Missed doses in the last 4 weeks: 0   Patient/Caregiver did not have any additional questions or concerns.   Therapeutic benefit summary: Unable to assess   Adverse events/side effects summary: No adverse events/side effects   Patient's therapy is appropriate to: Continue    Goals Addressed             This Visit's Progress    Stabilization of disease       Patient is on track. Patient will maintain adherence         Follow up:  3 months  Bobette Mo Specialty Pharmacist

## 2023-08-08 ENCOUNTER — Other Ambulatory Visit (HOSPITAL_COMMUNITY): Payer: Self-pay

## 2023-08-12 ENCOUNTER — Ambulatory Visit: Admission: EM | Admit: 2023-08-12 | Discharge: 2023-08-12 | Disposition: A | Discharge status: Home / Self Care

## 2023-08-12 DIAGNOSIS — J029 Acute pharyngitis, unspecified: Secondary | ICD-10-CM

## 2023-08-12 DIAGNOSIS — I10 Essential (primary) hypertension: Secondary | ICD-10-CM

## 2023-08-12 DIAGNOSIS — J0140 Acute pansinusitis, unspecified: Secondary | ICD-10-CM

## 2023-08-12 DIAGNOSIS — R0982 Postnasal drip: Secondary | ICD-10-CM

## 2023-08-12 LAB — POCT RAPID STREP A (OFFICE): Rapid Strep A Screen: NEGATIVE

## 2023-08-12 MED ORDER — AMOXICILLIN-POT CLAVULANATE 500-125 MG PO TABS: 1.0000 | ORAL_TABLET | Freq: Two times a day (BID) | ORAL | 0 refills | Status: DC

## 2023-08-12 NOTE — Discharge Instructions (Signed)
 Take Augmentin twice daily for 10 days.  Gargle with warm salt water.  Use Tylenol for pain relief.  If you are not feeling better with this medication please call and schedule an appointment with ENT.  If anything worsens and you have high fever, worsening cough, shortness of breath, difficulty swallowing, swelling of her throat, change in your voice you need to go to the emergency room.  Follow-up with your primary care soon as possible.

## 2023-08-12 NOTE — ED Triage Notes (Signed)
"  I am having a bad sore throat for about 6 days now, it hurts mostly in the mornings, last night hurt so bad felt like knives". No fever. No runny nose. Some cough "at times".

## 2023-08-12 NOTE — ED Provider Notes (Signed)
 [facility]    CSN: [id number] Arrival date & time: 08/12/23  4098      History   Chief Complaint Chief Complaint  Patient presents with   Sore Throat    HPI [name] is a 78 y.o. female.   Patient reports today with a weeklong history of URI symptoms including sore throat, congestion, cough.  She denies any fever, nausea, vomiting, chest pain, shortness of breath.  She reports that pain is rated 3 at rest but increases to 8/9 with attempted swallowing, described as sharp, no alleviating factors identified.  She is eating and drinking normally.  She has been using over-the-counter medication such as Tylenol without improvement of symptoms.  Denies any recent antibiotics.  Denies any known sick contacts.  She denies history of acid reflux; reports that this resolved several years ago and she has not tried taking any medication.  Denies any burning in her esophagus or GI symptoms.  She does not take inhaled corticosteroids.  Her blood pressure is elevated.  Denies any chest pain, shortness of breath, headache, vision change, dizziness.  She has not taken her blood pressure medication.  She denies any recent NSAID or decongestant use.    Past Medical History:  Diagnosis Date   Breast cancer (HCC)    Cancer (HCC) 02/2016   right breast   DIABETES MELLITUS, TYPE II 01/04/2007   only takes actoplus daily   Dizziness and giddiness 02/29/2008   DVT, HX OF    at age 70 in right buttocks   Dyspnea    due to lung cancer   Family history of breast cancer    GERD 01/04/2007   pt reports resolved    GLAUCOMA 07/30/2008   both eyes   History of blood transfusion    no abnormal  reaction   History of uterine cancer 2000   hysterectomy done   HYPERLIPIDEMIA 01/04/2007   taking Pravastatin daily   HYPERTENSION 01/04/2007   takes Lisinopril daily   Joint pain    Joint swelling    Leg cramps    LEG PAIN, LEFT 07/06/2007   NUMBNESS 07/30/2008   in fingers;pt states from  Diamox   OSTEOARTHRITIS, HIP 09/25/2009   OTITIS MEDIA, ACUTE, BILATERAL 02/29/2008   Overweight(278.02) 01/04/2007   Peripheral vascular disease (HCC)    Personal history of radiation therapy 2018   Pneumonia    PONV (postoperative nausea and vomiting)    SLEEP APNEA, OBSTRUCTIVE    doesn't use a cpap;study done about 17yrs ago   TRANSIENT ISCHEMIC ATTACK, HX OF 01/04/2007   Vision loss    left eye    Patient Active Problem List   Diagnosis Date Noted   Mixed stress and urge urinary incontinence 11/25/2022   Acute right-sided low back pain with right-sided sciatica 11/25/2022   Peripheral edema 01/19/2019   Renal mass, right 07/03/2017   Aortic atherosclerosis (HCC) 06/29/2017   Goals of care, counseling/discussion 05/29/2017   Chemotherapy-induced neuropathy (HCC) 11/12/2016   Hepatic steatosis 04/18/2016   Mediastinal lymphadenopathy    Pulmonary nodules 03/22/2016   Malignant neoplasm metastatic to lung (HCC) 03/15/2016   Genetic testing 02/16/2016   Morbid obesity (HCC) 02/03/2016   History of uterine cancer    Malignant neoplasm of upper-outer quadrant of right breast in female, estrogen receptor positive (HCC) 01/26/2016   Branch retinal vein occlusion of left eye 05/02/2015   Osteoarthritis of left hip 11/12/2012   Degenerative arthritis of hip 05/28/2012   Preventative health  care 10/22/2010   OSTEOARTHRITIS, HIP 09/25/2009   GLAUCOMA 07/30/2008   DVT, HX OF 07/06/2007   Diabetes (HCC) 01/04/2007   Hyperlipidemia associated with type 2 diabetes mellitus (HCC) 01/04/2007   Depression with anxiety 01/04/2007   Obstructive sleep apnea 01/04/2007   Essential hypertension 01/04/2007   Asthma 01/04/2007   GERD 01/04/2007    Past Surgical History:  Procedure Laterality Date   ABDOMINAL HYSTERECTOMY  2000   BREAST LUMPECTOMY Right 01/13/2017   x2   BREAST LUMPECTOMY WITH RADIOACTIVE SEED AND SENTINEL LYMPH NODE BIOPSY Right 01/13/2017   Procedure: RIGHT BREAST  RADIOACTIVE SEED X'S 2 GUIDED LUMPECTOMY WITH RADIOACTIVE SEED TARGETED AXILLARYLYMPH NODE EXCISION AND RIGHT AXILLARY SENTINEL LYMPH NODE BIOPSY;  Surgeon: Ovidio Kin, MD;  Location: [facility] OR;  Service: General;  Laterality: Right;  2 SEEDS IN RIGHT BREAST 1 SEED IN RIGHT AXILLARY NODE   CHOLECYSTECTOMY     ENDOBRONCHIAL ULTRASOUND Bilateral 03/28/2016   Procedure: ENDOBRONCHIAL ULTRASOUND;  Surgeon: Leslye Peer, MD;  Location: [facility];  Service: Cardiopulmonary;  Laterality: Bilateral;   EYE SURGERY  13   shunt left and lazer eye surgery on right cataract and retenia tear with repair   growth removal  2004   from thumb   KNEE ARTHROSCOPY Right    mulitple eye surgeries     both eyes, cataracts with ioc done both eyes   OOPHORECTOMY     right lumpectomy with axillary node dissection Right 01/2017   TOTAL HIP ARTHROPLASTY  06/24/2011   Procedure: TOTAL HIP ARTHROPLASTY;  Surgeon: Nestor Lewandowsky;  Location: [facility] OR;  Service: Orthopedics;  Laterality: Right;   TOTAL HIP ARTHROPLASTY Left 11/12/2012   Dr Turner Daniels   TOTAL HIP ARTHROPLASTY Left 11/12/2012   Procedure: TOTAL HIP ARTHROPLASTY;  Surgeon: Nestor Lewandowsky, MD;  Location: [facility] OR;  Service: Orthopedics;  Laterality: Left;  DEPUY PINNACLE    OB History   No obstetric history on file.      Home Medications    Prior to Admission medications   Medication Sig Start Date End Date Taking? Authorizing Provider  amoxicillin-clavulanate (AUGMENTIN) 500-125 MG tablet Take 1 tablet by mouth in the morning and at bedtime. 08/12/23  Yes Tibor Lemmons, Noberto Retort, PA-C  acetaminophen (TYLENOL) 500 MG tablet Take 1,000 mg by mouth every 4 (four) hours as needed for moderate pain or fever.    [provider]  aspirin EC 81 MG tablet Take 81 mg by mouth daily at 6 PM. 1700    [provider]  Biotin 1 MG CAPS Take by mouth.    [provider]  brimonidine (ALPHAGAN) 0.2 % ophthalmic solution INSTILL 1 DROP INTO EACH EYE THREE TIMES  DAILY 11/23/20   [provider]  cholecalciferol (VITAMIN D) 1000 units tablet Take 1,000 Units by mouth daily.    [provider]  COMIRNATY syringe Inject 0.3 mLs into the muscle once. 03/01/23   [provider]  dorzolamide-timolol (COSOPT) 22.3-6.8 MG/ML ophthalmic solution Place 1 drop into both eyes 2 (two) times daily. 12/20/16   [provider]  FLUZONE HIGH-DOSE 0.5 ML injection Inject 0.5 mLs into the muscle once. 03/01/23   [provider]  gabapentin (NEURONTIN) 100 MG capsule Take 200 mg at bedtime, can increase to 300 mg at bedtime if tolerated 12/13/22   Pincus Sanes, MD  ketorolac (ACULAR) 0.5 % ophthalmic solution Place 1 drop into the right eye 2 (two) times daily. 11/29/22   [provider]  letrozole [facility]) 2.5 MG tablet Take 1 tablet by mouth once daily 06/27/23   Rachel Moulds, MD  lisinopril (ZESTRIL) 5 MG tablet Take 1 tablet (5 mg total) by mouth daily. 08/31/22   Myrlene Broker, MD  lovastatin (MEVACOR) 20 MG tablet Take 1 tablet (20 mg total) by mouth at bedtime. 08/31/22   Myrlene Broker, MD  meloxicam (MOBIC) 15 MG tablet Take 1 tablet (15 mg total) by mouth daily. Take with food 12/13/22   Pincus Sanes, MD  Netarsudil-Latanoprost [facility]) 0.02-0.005 % SOLN Apply to eye. 06/30/20   Magrinat, Valentino Hue, MD  palbociclib [name]) 75 MG tablet Take 1 tablet (75 mg total) by mouth daily. Take for 21 days on, 7 days off, repeat every 28 days. 06/26/23   Rachel Moulds, MD  pioglitazone-metformin (ACTOPLUS MET) [phone] MG tablet Take 1 tablet by mouth once daily 06/27/23   Myrlene Broker, MD  prednisoLONE acetate (PRED FORTE) 1 % ophthalmic suspension SMARTSIG:1 In Eye(s) 6 Times Daily 01/16/20   [provider]    Family History Family History  Problem Relation Age of Onset   Breast cancer Mother 41   Dementia Mother    Cancer Mother        Breast and lung cancer   Stroke Sister    Breast  cancer Sister 74   Heart attack Maternal Aunt    Lung cancer Maternal Grandmother        non smoker   Glaucoma Maternal Grandfather    Anesthesia problems Neg Hx     Social History Social History   Tobacco Use   Smoking status: Never   Smokeless tobacco: Never  Vaping Use   Vaping status: Never Used  Substance Use Topics   Alcohol use: No   Drug use: No     Allergies   Codeine, Fluorescein, Lipitor [atorvastatin calcium], Oxycodone, Sitagliptin phosphate, and Sulfa drugs cross reactors   Review of Systems Review of Systems  Constitutional:  Positive for activity change. Negative for appetite change, fatigue and fever.  HENT:  Positive for congestion and sore throat. Negative for sinus pressure, sneezing, trouble swallowing and voice change.   Respiratory:  Positive for cough. Negative for shortness of breath.   Cardiovascular:  Negative for chest pain.  Gastrointestinal:  Negative for abdominal pain, diarrhea, nausea and vomiting.  Neurological:  Negative for dizziness, light-headedness and headaches.     Physical Exam Triage Vital Signs ED Triage Vitals  Encounter Vitals Group     BP 08/12/23 0850 (!) 170/91     Systolic BP Percentile --      Diastolic BP Percentile --      Pulse Rate 08/12/23 0850 100     Resp 08/12/23 0850 18     Temp 08/12/23 0850 98.6 F (37 C)     Temp Source 08/12/23 0850 Oral     SpO2 08/12/23 0850 96 %     Weight 08/12/23 0848 233 lb 7.5 oz (105.9 kg)     Height 08/12/23 0848 5\' 4"  (1.626 m)     Head Circumference --      Peak Flow --      Pain Score 08/12/23 0846 10     Pain Loc --      Pain Education --      Exclude from Growth Chart --    No data found.  Updated Vital Signs BP (!) 175/91 (BP Location: Left Arm)   Pulse 100   Temp 98.6 F (37  C) (Oral)   Resp 18   Ht 5\' 4"  (1.626 m)   Wt 233 lb 7.5 oz (105.9 kg)   SpO2 96%   BMI 40.07 kg/m   Visual Acuity Right Eye Distance:   Left Eye Distance:   Bilateral  Distance:    Right Eye Near:   Left Eye Near:    Bilateral Near:     Physical Exam Vitals reviewed.  Constitutional:      General: She is awake. She is not in acute distress.    Appearance: Normal appearance. She is well-developed. She is not ill-appearing.     Comments: Very pleasant female appears stated age in no acute distress   HENT:     Head: Normocephalic and atraumatic.     Right Ear: Ear canal and external ear normal. A middle ear effusion is present. Tympanic membrane is not erythematous or bulging.     Left Ear: Tympanic membrane, ear canal and external ear normal. Tympanic membrane is not erythematous or bulging.     Nose:     Right Sinus: Maxillary sinus tenderness present. No frontal sinus tenderness.     Left Sinus: Maxillary sinus tenderness present. No frontal sinus tenderness.     Mouth/Throat:     Pharynx: Uvula midline. Posterior oropharyngeal erythema and postnasal drip present. No oropharyngeal exudate.  Cardiovascular:     Rate and Rhythm: Normal rate and regular rhythm.     Heart sounds: Normal heart sounds, S1 normal and S2 normal. No murmur heard. Pulmonary:     Effort: Pulmonary effort is normal.     Breath sounds: Normal breath sounds. No wheezing, rhonchi or rales.     Comments: Clear to auscultation bilaterally Lymphadenopathy:     Head:     Right side of head: No submental, submandibular or tonsillar adenopathy.     Left side of head: No submental, submandibular or tonsillar adenopathy.     Cervical: No cervical adenopathy.  Psychiatric:        Behavior: Behavior is cooperative.      UC Treatments / Results  Labs (all labs ordered are listed, but only abnormal results are displayed) Labs Reviewed  POCT RAPID STREP A (OFFICE) - Normal    EKG   Radiology No results found.  Procedures Procedures (including critical care time)  Medications Ordered in UC Medications - No data to display  Initial Impression / Assessment and Plan / UC  Course  I have reviewed the triage vital signs and the nursing notes.  Pertinent labs & imaging results that were available during my care of the patient were reviewed by me and considered in my medical decision making (see chart for details).     Patient is well-appearing, afebrile, nontoxic, nontachycardic.  Strep testing was obtained in triage that was negative.  I am concerned for sinus infection with postnasal drainage causing sore throat given her clinical presentation.  Will start Augmentin twice daily for 10 days.  No indication for dose adjustment based on metabolic panel from 06/26/2023 with a creatinine of 0.79 and calculated creatinine clearance of 99.5 mL/min.  She was encouraged to gargle with warm salt water and use Tylenol to help with her symptoms.  If they are not improving quickly she has a follow-up with ENT and was given contact information for local provider.  Discussed that if anything worsens or changes and she has high fever, swelling of her throat, shortness of breath, muffled voice she needs to be seen immediately.  Strict  return precautions given.    Her blood pressure is elevated but she not taken her medication today.  She denies any chest pain, shortness of breath, headache, vision change, dizziness.  Recommended she go home and take her medicines.  She is to avoid decongestants, caffeine, sodium, NSAIDs.  Recommend she monitor her blood pressure and if stays above 140/90 or if she develops any alarm symptoms in the setting of high blood pressure she needs to be seen immediately.  Recommend close follow-up with her primary care.  Final Clinical Impressions(s) / UC Diagnoses   Final diagnoses:  Acute non-recurrent pansinusitis  Post-nasal drainage  Sore throat  Elevated blood pressure reading with diagnosis of hypertension     Discharge Instructions      Take Augmentin twice daily for 10 days.  Gargle with warm salt water.  Use Tylenol for pain relief.  If you are  not feeling better with this medication please call and schedule an appointment with ENT.  If anything worsens and you have high fever, worsening cough, shortness of breath, difficulty swallowing, swelling of her throat, change in your voice you need to go to the emergency room.  Follow-up with your primary care soon as possible.     ED Prescriptions     Medication Sig Dispense Auth. Provider   amoxicillin-clavulanate (AUGMENTIN) 500-125 MG tablet Take 1 tablet by mouth in the morning and at bedtime. 20 tablet Emerson Barretto, Noberto Retort, PA-C      PDMP not reviewed this encounter.   Jeani Hawking, PA-C 08/12/23 1005

## 2023-08-14 ENCOUNTER — Ambulatory Visit (HOSPITAL_COMMUNITY)
Admission: RE | Admit: 2023-08-14 | Discharge: 2023-08-14 | Disposition: A | Discharge status: Home / Self Care | Payer: PPO | Source: Ambulatory Visit | Attending: Hematology and Oncology | Admitting: Hematology and Oncology

## 2023-08-14 DIAGNOSIS — C50411 Malignant neoplasm of upper-outer quadrant of right female breast: Secondary | ICD-10-CM | POA: Diagnosis not present

## 2023-08-14 DIAGNOSIS — C50919 Malignant neoplasm of unspecified site of unspecified female breast: Secondary | ICD-10-CM | POA: Diagnosis not present

## 2023-08-14 DIAGNOSIS — I700 Atherosclerosis of aorta: Secondary | ICD-10-CM | POA: Diagnosis not present

## 2023-08-14 DIAGNOSIS — E049 Nontoxic goiter, unspecified: Secondary | ICD-10-CM | POA: Diagnosis not present

## 2023-08-14 DIAGNOSIS — Z170 Estrogen receptor positive status [ER+]: Secondary | ICD-10-CM | POA: Insufficient documentation

## 2023-08-14 DIAGNOSIS — R932 Abnormal findings on diagnostic imaging of liver and biliary tract: Secondary | ICD-10-CM | POA: Diagnosis not present

## 2023-08-14 LAB — POCT I-STAT CREATININE: Creatinine, Ser: 0.8 mg/dL (ref 0.44–1.00)

## 2023-08-14 MED ORDER — IOHEXOL 300 MG/ML  SOLN
100.0000 mL | Freq: Once | INTRAMUSCULAR | Status: AC | PRN
  Administered 2023-08-14: 100 mL via INTRAVENOUS

## 2023-08-15 ENCOUNTER — Ambulatory Visit: Payer: PPO | Admitting: Internal Medicine

## 2023-08-22 ENCOUNTER — Other Ambulatory Visit: Payer: Self-pay

## 2023-08-23 ENCOUNTER — Other Ambulatory Visit (HOSPITAL_COMMUNITY): Payer: Self-pay

## 2023-08-23 ENCOUNTER — Other Ambulatory Visit: Payer: Self-pay

## 2023-08-23 NOTE — Progress Notes (Signed)
 Specialty Pharmacy Refill Coordination Note  [name] is a 78 y.o. female contacted today regarding refills of specialty medication(s) Palbociclib [name] at [facility] at [place] date: 09/05/23   Medication will be filled on 09/04/23.

## 2023-08-24 ENCOUNTER — Telehealth: Payer: Self-pay

## 2023-08-24 NOTE — Telephone Encounter (Signed)
 Pt called to ask about CT results. Advised pt results are not back and once scan is read it will be result to her. She verbalized thanks and understanding.

## 2023-08-25 ENCOUNTER — Encounter: Payer: Self-pay | Admitting: Internal Medicine

## 2023-08-25 ENCOUNTER — Ambulatory Visit: Payer: PPO | Admitting: Internal Medicine

## 2023-08-25 VITALS — BP 138/68 | HR 104 | Temp 97.9°F | Ht 64.0 in | Wt 230.0 lb

## 2023-08-25 DIAGNOSIS — E1169 Type 2 diabetes mellitus with other specified complication: Secondary | ICD-10-CM | POA: Diagnosis not present

## 2023-08-25 DIAGNOSIS — Z7984 Long term (current) use of oral hypoglycemic drugs: Secondary | ICD-10-CM | POA: Diagnosis not present

## 2023-08-25 DIAGNOSIS — E785 Hyperlipidemia, unspecified: Secondary | ICD-10-CM

## 2023-08-25 DIAGNOSIS — K760 Fatty (change of) liver, not elsewhere classified: Secondary | ICD-10-CM

## 2023-08-25 LAB — MICROALBUMIN / CREATININE URINE RATIO
Creatinine,U: 65.5 mg/dL
Microalb Creat Ratio: UNDETERMINED mg/g (ref 0.0–30.0)
Microalb, Ur: <0.7 mg/dL

## 2023-08-25 LAB — LIPID PANEL
Cholesterol: 191 mg/dL (ref 0–200)
HDL: 50.6 mg/dL (ref >39.00)
LDL Cholesterol: 103 mg/dL — ABNORMAL HIGH (ref 0–99)
NonHDL: 140.69
Total CHOL/HDL Ratio: 4
Triglycerides: 186 mg/dL — ABNORMAL HIGH (ref 0.0–149.0)
VLDL: 37.2 mg/dL (ref 0.0–40.0)

## 2023-08-25 LAB — HEMOGLOBIN A1C: Hgb A1c MFr Bld: 8.6 % — ABNORMAL HIGH (ref 4.6–6.5)

## 2023-08-25 MED ORDER — METFORMIN HCL ER 500 MG PO TB24: 2000.0000 mg | ORAL_TABLET | Freq: Every day | ORAL | 3 refills | Status: DC

## 2023-08-25 NOTE — Progress Notes (Signed)
   Subjective:   Patient ID: [name], female    DOB: June 15, 1945, 78 y.o.   MRN: [id number]  HPI The patient is a 78 YO female coming in for medical management.See A/P for details. Taking lovastatin every other day.   Review of Systems  Constitutional: Negative.   HENT: Negative.    Eyes: Negative.   Respiratory:  Negative for cough, chest tightness and shortness of breath.   Cardiovascular:  Negative for chest pain, palpitations and leg swelling.  Gastrointestinal:  Negative for abdominal distention, abdominal pain, constipation, diarrhea, nausea and vomiting.  Musculoskeletal: Negative.   Skin: Negative.   Neurological: Negative.   Psychiatric/Behavioral: Negative.      Objective:  Physical Exam Constitutional:      Appearance: She is well-developed.  HENT:     Head: Normocephalic and atraumatic.  Cardiovascular:     Rate and Rhythm: Normal rate and regular rhythm.  Pulmonary:     Effort: Pulmonary effort is normal. No respiratory distress.     Breath sounds: Normal breath sounds. No wheezing or rales.  Abdominal:     General: Bowel sounds are normal. There is no distension.     Palpations: Abdomen is soft.     Tenderness: There is no abdominal tenderness. There is no rebound.  Musculoskeletal:     Cervical back: Normal range of motion.  Skin:    General: Skin is warm and dry.  Neurological:     Mental Status: She is alert and oriented to person, place, and time.     Coordination: Coordination normal.     Vitals:   08/25/23 0914  BP: 138/68  Pulse: (!) 104  Temp: 97.9 F (36.6 C)  TempSrc: Oral  SpO2: 98%  Weight: 230 lb (104.3 kg)  Height: 5\' 4"  (1.626 m)    Assessment & Plan:

## 2023-08-25 NOTE — Assessment & Plan Note (Signed)
 Checking lipid panel and suspect will not be at goal since it was not at goal last year. She is not taking lovastatin as prescribed but is taking every other day. I have asked her to resume daily.

## 2023-08-25 NOTE — Assessment & Plan Note (Signed)
 Suspect HgA1c will be higher based on random readings she is getting and getting with oncology draws. She is currently taking pioglitazone/metformin and wishes to stop pioglitazone due to side effects. Have d/ced and will prescribe metformin 2000 mg daily instead. Checking microalbumin creatinine ratio and lipid panel and HgA1c and adjust as needed. I suspect she will need another agent as well and will add jardiance likely.

## 2023-08-25 NOTE — Assessment & Plan Note (Signed)
 BMI 39 and complicated by diabetes and hyperlipidemia. Counseled and will likely add medication for diabetes to help with weight depending on results.

## 2023-08-25 NOTE — Patient Instructions (Signed)
 We will stop pioglitazone/metformin.   We will start instead metformin alone. Take 4 pills in the morning of this this is the same dose.   We are checking the labs today.

## 2023-08-25 NOTE — Assessment & Plan Note (Signed)
Recent CMP stable.

## 2023-08-28 ENCOUNTER — Other Ambulatory Visit: Payer: Self-pay | Admitting: Hematology and Oncology

## 2023-08-28 ENCOUNTER — Other Ambulatory Visit: Payer: Self-pay | Admitting: *Deleted

## 2023-08-28 DIAGNOSIS — C50411 Malignant neoplasm of upper-outer quadrant of right female breast: Secondary | ICD-10-CM

## 2023-08-31 ENCOUNTER — Ambulatory Visit: Payer: PPO | Admitting: Internal Medicine

## 2023-09-01 ENCOUNTER — Other Ambulatory Visit: Payer: Self-pay | Admitting: Hematology and Oncology

## 2023-09-04 ENCOUNTER — Other Ambulatory Visit: Payer: Self-pay

## 2023-09-07 ENCOUNTER — Ambulatory Visit: Payer: PPO | Admitting: Internal Medicine

## 2023-09-11 ENCOUNTER — Ambulatory Visit (HOSPITAL_COMMUNITY)
Admission: RE | Admit: 2023-09-11 | Discharge: 2023-09-11 | Disposition: A | Discharge status: Home / Self Care | Source: Ambulatory Visit | Attending: Hematology and Oncology | Admitting: Hematology and Oncology

## 2023-09-11 DIAGNOSIS — Z170 Estrogen receptor positive status [ER+]: Secondary | ICD-10-CM | POA: Insufficient documentation

## 2023-09-11 DIAGNOSIS — C50411 Malignant neoplasm of upper-outer quadrant of right female breast: Secondary | ICD-10-CM | POA: Insufficient documentation

## 2023-09-11 DIAGNOSIS — E042 Nontoxic multinodular goiter: Secondary | ICD-10-CM | POA: Diagnosis not present

## 2023-09-14 ENCOUNTER — Other Ambulatory Visit (HOSPITAL_COMMUNITY): Payer: Self-pay

## 2023-09-18 ENCOUNTER — Other Ambulatory Visit: Payer: Self-pay | Admitting: *Deleted

## 2023-09-18 DIAGNOSIS — C7800 Secondary malignant neoplasm of unspecified lung: Secondary | ICD-10-CM

## 2023-09-18 DIAGNOSIS — C50411 Malignant neoplasm of upper-outer quadrant of right female breast: Secondary | ICD-10-CM

## 2023-09-19 ENCOUNTER — Inpatient Hospital Stay: Payer: PPO | Admitting: Hematology and Oncology

## 2023-09-19 ENCOUNTER — Inpatient Hospital Stay: Payer: PPO | Attending: Hematology and Oncology

## 2023-09-19 VITALS — BP 167/68 | HR 99 | Temp 97.6°F | Resp 17 | Wt 229.5 lb

## 2023-09-19 DIAGNOSIS — Z90710 Acquired absence of both cervix and uterus: Secondary | ICD-10-CM | POA: Insufficient documentation

## 2023-09-19 DIAGNOSIS — Z79811 Long term (current) use of aromatase inhibitors: Secondary | ICD-10-CM | POA: Insufficient documentation

## 2023-09-19 DIAGNOSIS — E278 Other specified disorders of adrenal gland: Secondary | ICD-10-CM | POA: Diagnosis not present

## 2023-09-19 DIAGNOSIS — Z801 Family history of malignant neoplasm of trachea, bronchus and lung: Secondary | ICD-10-CM | POA: Insufficient documentation

## 2023-09-19 DIAGNOSIS — Z8542 Personal history of malignant neoplasm of other parts of uterus: Secondary | ICD-10-CM | POA: Diagnosis not present

## 2023-09-19 DIAGNOSIS — Z923 Personal history of irradiation: Secondary | ICD-10-CM | POA: Diagnosis not present

## 2023-09-19 DIAGNOSIS — Z170 Estrogen receptor positive status [ER+]: Secondary | ICD-10-CM | POA: Diagnosis not present

## 2023-09-19 DIAGNOSIS — E042 Nontoxic multinodular goiter: Secondary | ICD-10-CM | POA: Insufficient documentation

## 2023-09-19 DIAGNOSIS — R918 Other nonspecific abnormal finding of lung field: Secondary | ICD-10-CM | POA: Diagnosis not present

## 2023-09-19 DIAGNOSIS — C7800 Secondary malignant neoplasm of unspecified lung: Secondary | ICD-10-CM

## 2023-09-19 DIAGNOSIS — Z1732 Human epidermal growth factor receptor 2 negative status: Secondary | ICD-10-CM | POA: Diagnosis not present

## 2023-09-19 DIAGNOSIS — Z1721 Progesterone receptor positive status: Secondary | ICD-10-CM | POA: Diagnosis not present

## 2023-09-19 DIAGNOSIS — E119 Type 2 diabetes mellitus without complications: Secondary | ICD-10-CM | POA: Diagnosis not present

## 2023-09-19 DIAGNOSIS — C50411 Malignant neoplasm of upper-outer quadrant of right female breast: Secondary | ICD-10-CM | POA: Insufficient documentation

## 2023-09-19 DIAGNOSIS — Z803 Family history of malignant neoplasm of breast: Secondary | ICD-10-CM | POA: Diagnosis not present

## 2023-09-19 DIAGNOSIS — R252 Cramp and spasm: Secondary | ICD-10-CM | POA: Diagnosis not present

## 2023-09-19 DIAGNOSIS — M791 Myalgia, unspecified site: Secondary | ICD-10-CM | POA: Insufficient documentation

## 2023-09-19 LAB — CBC WITH DIFFERENTIAL (CANCER CENTER ONLY)
Abs Immature Granulocytes: 0.01 10*3/uL (ref 0.00–0.07)
Basophils Absolute: 0 10*3/uL (ref 0.0–0.1)
Basophils Relative: 1 %
Eosinophils Absolute: 0 10*3/uL (ref 0.0–0.5)
Eosinophils Relative: 2 %
HCT: 37.4 % (ref 36.0–46.0)
Hemoglobin: 13.2 g/dL (ref 12.0–15.0)
Immature Granulocytes: 0 %
Lymphocytes Relative: 23 %
Lymphs Abs: 0.6 10*3/uL — ABNORMAL LOW (ref 0.7–4.0)
MCH: 32.4 pg (ref 26.0–34.0)
MCHC: 35.3 g/dL (ref 30.0–36.0)
MCV: 91.7 fL (ref 80.0–100.0)
Monocytes Absolute: 0.2 10*3/uL (ref 0.1–1.0)
Monocytes Relative: 9 %
Neutro Abs: 1.7 10*3/uL (ref 1.7–7.7)
Neutrophils Relative %: 65 %
Platelet Count: 193 10*3/uL (ref 150–400)
RBC: 4.08 MIL/uL (ref 3.87–5.11)
RDW: 14.2 % (ref 11.5–15.5)
WBC Count: 2.5 10*3/uL — ABNORMAL LOW (ref 4.0–10.5)
nRBC: 0 % (ref 0.0–0.2)

## 2023-09-19 LAB — CMP (CANCER CENTER ONLY)
ALT: 16 U/L (ref 0–44)
AST: 13 U/L — ABNORMAL LOW (ref 15–41)
Albumin: 4.3 g/dL (ref 3.5–5.0)
Alkaline Phosphatase: 70 U/L (ref 38–126)
Anion gap: 6 (ref 5–15)
BUN: 15 mg/dL (ref 8–23)
CO2: 26 mmol/L (ref 22–32)
Calcium: 9.2 mg/dL (ref 8.9–10.3)
Chloride: 104 mmol/L (ref 98–111)
Creatinine: 0.8 mg/dL (ref 0.44–1.00)
GFR, Estimated: >60 mL/min (ref >60)
Glucose, Bld: 354 mg/dL — ABNORMAL HIGH (ref 70–99)
Potassium: 4.4 mmol/L (ref 3.5–5.1)
Sodium: 136 mmol/L (ref 135–145)
Total Bilirubin: 0.5 mg/dL (ref 0.0–1.2)
Total Protein: 7 g/dL (ref 6.5–8.1)

## 2023-09-19 NOTE — Progress Notes (Signed)
 [facility]  [phone] [phone]     ID: [name] DOB: [birth year]  MR#: [id number]  JYN#:[id number]  Patient Care Team: Myrlene Broker, MD as PCP - General (Internal Medicine) Ovidio Kin, MD as Consulting Physician (General Surgery) Dorothy Puffer, MD as Consulting Physician (Radiation Oncology) Carrington Clamp, MD as Consulting Physician (Obstetrics and Gynecology) Belva Chimes, MD as Referring Physician (Specialist) Leslye Peer, MD as Consulting Physician (Pulmonary Disease) Berneice Heinrich Delbert Phenix., MD as Consulting Physician (Urology) Loraine Grip, Harrietta Guardian, MD as Referring Physician (Ophthalmology) Anselm Lis, RPH-CPP (Pharmacist) Augustin Schooling, MD as Consulting Physician (Ophthalmology) Rachel Moulds, MD as Consulting Physician (Hematology and Oncology)  CHIEF COMPLAINT: Estrogen receptor positive breast cancer  CURRENT TREATMENT: Letrozole, palbociclib  INTERVAL HISTORY:  [name] returns today for follow-up of her estrogen receptor positive stage IV breast cancer.    Discussed the use of AI scribe software for clinical note transcription with the patient, who gave verbal consent to proceed.  History of Present Illness [name] is a 78 year old female with breast cancer and diabetes who presents with extreme fatigue and shortness of breath.  She experiences extreme fatigue, describing it as feeling 'extremely tired' despite adequate sleep. She attributes some of this fatigue to letrozole, which she has been taking for a while, but notes that the fatigue has worsened recently. She also experiences shortness of breath, particularly when walking, which is a new symptom. She becomes out of breath easily and needs to stop to catch her breath. No cardiovascular issues are reported, but she is concerned about the new onset of breathlessness.  She has a history of diabetes, with her blood sugar recorded at 354 today. She  was previously on Actoplus but was switched to 2000 mg of metformin daily, which she feels is not effectively controlling her blood sugar. Her hemoglobin A1c was last recorded at 8.5, and she notes that her blood sugar was 240 three weeks ago.  She experienced a severe cold with sore throat, coughing, and congestion at the beginning of March, lasting for about three weeks. She has since recovered but notes that the fatigue could be related to this illness. She has lost 23 pounds since January by eating less, although she is not losing weight currently. She describes her diet as eating half of what her partner eats and choosing lighter options when dining out.  She experiences muscle cramps and aches, particularly when sitting still, which she describes as taking her breath away due to the pain. She stretches regularly to alleviate these symptoms.  She is currently taking Ibrance on a schedule of one week on and one week off, and she notes that she should have seen her provider last week but is seeing them this week instead.   Rest of the pertinent 10 point ROS reviewed and negative.  We are continuing to follow her tumor marker: Lab Results  Component Value Date   CA2729 35.9 06/26/2023   CA2729 33.2 03/28/2023   CA2729 26.1 09/14/2022   CA2729 28.7 06/21/2022   CA2729 31.6 03/30/2022    REVIEW OF SYSTEMS:   A detailed review of systems today was otherwise stable.   COVID 19 VACCINATION STATUS: Status post Pfizer x2 followed by booster August 2021; had COVID November 2022   BREAST CANCER HISTORY: From the original intake note:  [name] had screening mammography showing some suspicious calcifications in the right breast leading to right diagnostic mammography with ultrasonography 01/22/2016  at [place]. The breast density was category C. In the upper right breast there was a 2.3 cm mass with additional masses measuring 0.9 and 0.7 cm. There was also a possible additional 0.8 mass in the lower  inner quadrant. Ultrasound confirmed an irregular hypoechoic mass in the right breast upper outer quadrant measuring 2.0 cm. There were other masses measuring 0.7 and 0.8 cm by ultrasonography. The right axilla was sonographically benign.  Biopsy of a 12:00 and 4:00 mass in the right breast 01/22/2016 showed ([id number]) both specimens showing invasive ductal carcinoma, grade 1 or 2, both 95% estrogen receptor positive, both 95% progesterone receptor positive, both with strong staining intensity, with MIB-1 ranging from 10-15%, and both HER-2 negative, the signals ratio being 1.23-1.42, and the number per cell 1.85-2.59.  Her subsequent history is as detailed below   PAST MEDICAL HISTORY: Past Medical History:  Diagnosis Date   Breast cancer (HCC)    Cancer (HCC) 02/2016   right breast   DIABETES MELLITUS, TYPE II 01/04/2007   only takes actoplus daily   Dizziness and giddiness 02/29/2008   DVT, HX OF    at age 66 in right buttocks   Dyspnea    due to lung cancer   Family history of breast cancer    GERD 01/04/2007   pt reports resolved    GLAUCOMA 07/30/2008   both eyes   History of blood transfusion    no abnormal  reaction   History of uterine cancer 2000   hysterectomy done   HYPERLIPIDEMIA 01/04/2007   taking Pravastatin daily   HYPERTENSION 01/04/2007   takes Lisinopril daily   Joint pain    Joint swelling    Leg cramps    LEG PAIN, LEFT 07/06/2007   NUMBNESS 07/30/2008   in fingers;pt states from Diamox   OSTEOARTHRITIS, HIP 09/25/2009   OTITIS MEDIA, ACUTE, BILATERAL 02/29/2008   Overweight(278.02) 01/04/2007   Peripheral vascular disease (HCC)    Personal history of radiation therapy 2018   Pneumonia    PONV (postoperative nausea and vomiting)    SLEEP APNEA, OBSTRUCTIVE    doesn't use a cpap;study done about 29yrs ago   TRANSIENT ISCHEMIC ATTACK, HX OF 01/04/2007   Vision loss    left eye    PAST SURGICAL HISTORY: Past Surgical History:  Procedure Laterality  Date   ABDOMINAL HYSTERECTOMY  2000   BREAST LUMPECTOMY Right 01/13/2017   x2   BREAST LUMPECTOMY WITH RADIOACTIVE SEED AND SENTINEL LYMPH NODE BIOPSY Right 01/13/2017   Procedure: RIGHT BREAST RADIOACTIVE SEED X'S 2 GUIDED LUMPECTOMY WITH RADIOACTIVE SEED TARGETED AXILLARYLYMPH NODE EXCISION AND RIGHT AXILLARY SENTINEL LYMPH NODE BIOPSY;  Surgeon: Ovidio Kin, MD;  Location: [facility] OR;  Service: General;  Laterality: Right;  2 SEEDS IN RIGHT BREAST 1 SEED IN RIGHT AXILLARY NODE   CHOLECYSTECTOMY     ENDOBRONCHIAL ULTRASOUND Bilateral 03/28/2016   Procedure: ENDOBRONCHIAL ULTRASOUND;  Surgeon: Leslye Peer, MD;  Location: [facility];  Service: Cardiopulmonary;  Laterality: Bilateral;   EYE SURGERY  13   shunt left and lazer eye surgery on right cataract and retenia tear with repair   growth removal  2004   from thumb   KNEE ARTHROSCOPY Right    mulitple eye surgeries     both eyes, cataracts with ioc done both eyes   OOPHORECTOMY     right lumpectomy with axillary node dissection Right 01/2017   TOTAL HIP ARTHROPLASTY  06/24/2011   Procedure: TOTAL HIP ARTHROPLASTY;  Surgeon:  Nestor Lewandowsky;  Location: [facility] OR;  Service: Orthopedics;  Laterality: Right;   TOTAL HIP ARTHROPLASTY Left 11/12/2012   Dr Turner Daniels   TOTAL HIP ARTHROPLASTY Left 11/12/2012   Procedure: TOTAL HIP ARTHROPLASTY;  Surgeon: Nestor Lewandowsky, MD;  Location: [facility] OR;  Service: Orthopedics;  Laterality: Left;  DEPUY PINNACLE    FAMILY HISTORY Family History  Problem Relation Age of Onset   Breast cancer Mother 109   Dementia Mother    Cancer Mother        Breast and lung cancer   Stroke Sister    Breast cancer Sister 59   Heart attack Maternal Aunt    Lung cancer Maternal Grandmother        non smoker   Glaucoma Maternal Grandfather    Anesthesia problems Neg Hx   The patient's father died at age 68, the patient's mother died at age 69. She had breast and lung cancers diagnosed shortly before her death. The patient had no  brothers, 2 sisters. One sister was diagnosed with breast cancer at the age of 21.   GYNECOLOGIC HISTORY:  No LMP recorded. Patient has had a hysterectomy. Menarche age 3, first live birth age 1, the patient is GX P1. She had a hysterectomy for endometrial cancer in the year 2000. She did not take hormone replacement. She did use oral contraceptives for more than 20 years remotely, with no complications.   SOCIAL HISTORY: (Updated July 2021). 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 She is home with her husband Maisie Fus. He is a retired Curator.Their son Leonette Most also lives in [place].  The patient has 3 grandchildren aged 26, 16 and 51    ADVANCED DIRECTIVES: In place   HEALTH MAINTENANCE: Social History   Tobacco Use   Smoking status: Never   Smokeless tobacco: Never  Vaping Use   Vaping status: Never Used  Substance Use Topics   Alcohol use: No   Drug use: No     Colonoscopy: Never  PAP: Status post hysterectomy  Bone density: Remote   Allergies  Allergen Reactions   Codeine Hives    Hycodan syrup   Fluorescein Nausea And Vomiting    ? IV dye for retina specialist   Lipitor [Atorvastatin Calcium]     Leg cramp   Oxycodone Nausea And Vomiting    Patient vomited for 3 days after taking   Sitagliptin Phosphate Nausea And Vomiting   Sulfa Drugs Cross Reactors Nausea And Vomiting    Current Outpatient Medications  Medication Sig Dispense Refill   acetaminophen (TYLENOL) 500 MG tablet Take 1,000 mg by mouth every 4 (four) hours as needed for moderate pain or fever.     aspirin EC 81 MG tablet Take 81 mg by mouth daily at 6 PM. 1700     Biotin 1 MG CAPS Take by mouth.     brimonidine (ALPHAGAN) 0.2 % ophthalmic solution INSTILL 1 DROP INTO EACH EYE THREE TIMES DAILY     cholecalciferol (VITAMIN D) 1000 units tablet Take 1,000 Units by mouth daily.     dorzolamide-timolol (COSOPT) 22.3-6.8 MG/ML  ophthalmic solution Place 1 drop into both eyes 2 (two) times daily.     gabapentin (NEURONTIN) 100 MG capsule Take 200 mg at bedtime, can increase to 300 mg at bedtime if tolerated 90 capsule 3   ketorolac (ACULAR) 0.5 % ophthalmic solution Place 1 drop into the right eye 2 (  two) times daily.     letrozole (FEMARA) 2.5 MG tablet Take 1 tablet by mouth once daily 90 tablet 0   lisinopril (ZESTRIL) 5 MG tablet Take 1 tablet (5 mg total) by mouth daily. 90 tablet 3   lovastatin (MEVACOR) 20 MG tablet Take 1 tablet (20 mg total) by mouth at bedtime. 90 tablet 3   meloxicam (MOBIC) 15 MG tablet Take 1 tablet (15 mg total) by mouth daily. Take with food 30 tablet 0   metFORMIN (GLUCOPHAGE-XR) 500 MG 24 hr tablet Take 4 tablets (2,000 mg total) by mouth daily with breakfast. 360 tablet 3   Netarsudil-Latanoprost (ROCKLATAN) 0.02-0.005 % SOLN Apply to eye.     palbociclib (IBRANCE) 75 MG tablet Take 1 tablet (75 mg total) by mouth daily. Take for 21 days on, 7 days off, repeat every 28 days. 21 tablet 11   prednisoLONE acetate (PRED FORTE) 1 % ophthalmic suspension SMARTSIG:1 In Eye(s) 6 Times Daily     No current facility-administered medications for this visit.     OBJECTIVE: white woman in no acute distress  Vitals:   09/19/23 1101  BP: (!) 167/68  Pulse: 99  Resp: 17  Temp: 97.6 F (36.4 C)  SpO2: 98%    Wt Readings from Last 3 Encounters:  09/19/23 229 lb 8 oz (104.1 kg)  08/25/23 230 lb (104.3 kg)  08/12/23 233 lb 7.5 oz (105.9 kg)   Body mass index is 39.39 kg/m.    ECOG FS:1 - Symptomatic but completely ambulatory  Physical Exam Constitutional:      Appearance: Normal appearance.  Cardiovascular:     Rate and Rhythm: Normal rate and regular rhythm.     Pulses: Normal pulses.     Heart sounds: Normal heart sounds.  Pulmonary:     Effort: Pulmonary effort is normal.     Breath sounds: Normal breath sounds.  Musculoskeletal:        General: No swelling. Normal range of  motion.     Cervical back: Normal range of motion and neck supple. No rigidity.  Lymphadenopathy:     Cervical: No cervical adenopathy.  Skin:    General: Skin is warm and dry.  Neurological:     General: No focal deficit present.     Mental Status: She is alert.  Psychiatric:        Mood and Affect: Mood normal.      LAB RESULTS:  CMP     Component Value Date/Time   NA 136 09/19/2023 1035   NA 140 05/29/2017 1254   K 4.4 09/19/2023 1035   K 4.4 05/29/2017 1254   CL 104 09/19/2023 1035   CO2 26 09/19/2023 1035   CO2 25 05/29/2017 1254   GLUCOSE 354 (H) 09/19/2023 1035   GLUCOSE 154 (H) 05/29/2017 1254   BUN 15 09/19/2023 1035   BUN 11.5 05/29/2017 1254   CREATININE 0.80 09/19/2023 1035   CREATININE 0.8 05/29/2017 1254   CALCIUM 9.2 09/19/2023 1035   CALCIUM 9.3 05/29/2017 1254   PROT 7.0 09/19/2023 1035   PROT 7.0 05/29/2017 1254   ALBUMIN 4.3 09/19/2023 1035   ALBUMIN 4.1 05/29/2017 1254   AST 13 (L) 09/19/2023 1035   AST 13 05/29/2017 1254   ALT 16 09/19/2023 1035   ALT 14 05/29/2017 1254   ALKPHOS 70 09/19/2023 1035   ALKPHOS 66 05/29/2017 1254   BILITOT 0.5 09/19/2023 1035   BILITOT 0.50 05/29/2017 1254   GFRNONAA >60 09/19/2023 1035   GFRAA >  60 03/10/2020 1138    INo results found for: "SPEP", "UPEP"  Lab Results  Component Value Date   WBC 2.5 (L) 09/19/2023   NEUTROABS 1.7 09/19/2023   HGB 13.2 09/19/2023   HCT 37.4 09/19/2023   MCV 91.7 09/19/2023   PLT 193 09/19/2023      Chemistry      Component Value Date/Time   NA 136 09/19/2023 1035   NA 140 05/29/2017 1254   K 4.4 09/19/2023 1035   K 4.4 05/29/2017 1254   CL 104 09/19/2023 1035   CO2 26 09/19/2023 1035   CO2 25 05/29/2017 1254   BUN 15 09/19/2023 1035   BUN 11.5 05/29/2017 1254   CREATININE 0.80 09/19/2023 1035   CREATININE 0.8 05/29/2017 1254      Component Value Date/Time   CALCIUM 9.2 09/19/2023 1035   CALCIUM 9.3 05/29/2017 1254   ALKPHOS 70 09/19/2023 1035   ALKPHOS  66 05/29/2017 1254   AST 13 (L) 09/19/2023 1035   AST 13 05/29/2017 1254   ALT 16 09/19/2023 1035   ALT 14 05/29/2017 1254   BILITOT 0.5 09/19/2023 1035   BILITOT 0.50 05/29/2017 1254       No results found for: "LABCA2"  No components found for: "LABCA125"  No results for input(s): "INR" in the last 168 hours.  Urinalysis    Component Value Date/Time   COLORURINE YELLOW 11/25/2022 1005   APPEARANCEUR CLEAR 11/25/2022 1005   LABSPEC 1.025 11/25/2022 1005   PHURINE 6.0 11/25/2022 1005   GLUCOSEU NEGATIVE 11/25/2022 1005   HGBUR NEGATIVE 11/25/2022 1005   BILIRUBINUR NEGATIVE 11/25/2022 1005   KETONESUR NEGATIVE 11/25/2022 1005   PROTEINUR 30 (A) 08/18/2017 1322   UROBILINOGEN 0.2 11/25/2022 1005   NITRITE POSITIVE (A) 11/25/2022 1005   LEUKOCYTESUR MODERATE (A) 11/25/2022 1005    STUDIES: US THYROID Result Date: 10/12/2023 CLINICAL DATA:  Prior ultrasound follow-up.  Multinodular goiter EXAM: THYROID ULTRASOUND TECHNIQUE: Ultrasound examination of the thyroid gland and adjacent soft tissues was performed. COMPARISON:  Prior thyroid ultrasound 03/23/2022; 05/11/2021 FINDINGS: Parenchymal Echotexture: Mildly heterogenous Isthmus: 0.3 cm Right lobe: 3.9 x 1.4 x 1.5 cm Left lobe: 4.7 x 2.1 x 2.4 cm _________________________________________________________ Estimated total number of nodules >/= 1 cm: 2 Number of spongiform nodules >/=  2 cm not described below (TR1): 0 Number of mixed cystic and solid nodules >/= 1.5 cm not described below (TR2): 0 _________________________________________________________ Nodule # 1: Hypoechoic solid nodule with central dystrophic calcification in the right upper gland is again noted and consistent with TI-RADS category 4. Given size (<0.9 cm) and appearance, this nodule does NOT meet TI-RADS criteria for biopsy or dedicated follow-up. Nodule # 2: Vague hypoechoic solid nodule in the right mid gland remains stable at 1.2 x 0.8 x 1.1 cm. Findings remain  consistent with TI-RADS category 4. *Given size (>/= 1 - 1.4 cm) and appearance, a follow-up ultrasound in 1 year should be considered based on TI-RADS criteria. Nodule # 3: Decreasing size of hypoechoic solid nodule in the right mid gland now measuring only 0.7 x 0.7 x 0.6 cm compared to up to 1.1 cm previously. Nodule no longer meets criteria for imaging surveillance. Given size (<0.9 cm) and appearance, this nodule does NOT meet TI-RADS criteria for biopsy or dedicated follow-up. Nodule # 4: Stable appearance of large spongiform nodule in the left mid gland at 3.4 x 1.8 x 2.5 cm. TI-RADS category 2. This nodule does NOT meet TI-RADS criteria for biopsy or dedicated follow-up.  IMPRESSION: 1. No significant interval change in the size or appearance of nodule # 2 in the right mid gland. This exam marks just over 2 years of stability. Nodule continues to meet criteria for imaging surveillance. Recommend follow-up ultrasound in 1 year. 2. Nodule # 3 also in the right mid gland is involuting over time and no longer meets criteria for continued imaging surveillance. 3. No new nodules. The above is in keeping with the ACR TI-RADS recommendations - J Am Coll Radiol 2017;14:587-595. Electronically Signed   By: Malachy Moan M.D.   On: 09/17/2023 10:58     ELIGIBLE FOR AVAILABLE RESEARCH PROTOCOL: no  ASSESSMENT: 78 y.o. [place] woman with a remote history of early stage endometrial cancer, subsequently status post right breast upper outer quadrant biopsy 01/22/2016 for a clinically multifocal T2 N0, stage 2A invasive ductal carcinoma, grade 1, estrogen and progesterone receptor positive, HER-2 negative, with an MIB-1 between 10 and 15%.  (1) right axillary lymph node biopsy 03/02/2016 positive  (2) genetics testing 01/13/2016 through the Custom gene panel offered by [facility] found no deleterious mutations in  ATM, BARD1, BRCA1, BRCA2, BRIP1, CDH1, CHEK2, EPCAM, FANCC, MLH1, MSH2, MSH6, MUTYH, NBN, PALB2,  PMS2, POLD1, PTEN, RAD51C, RAD51D, TP53, and XRCC2  METASTATIC DISEASE: OCT 2017 (3) CT scans of the chest abdomen and pelvis obtained 03/10/2016 are consistent with bilateral lung metastases and mediastinal and hilar nodal involvement, but no liver or bone spread  (a) bronchoscopic lymph node biopsy 2 (station 7, 13R) 03/28/2016 confirms metastatic adenocarcinoma, estrogen receptor positive, HER-2 not amplified  (b) baseline CA-27-29 on 04/18/2016 was 137.5.  (4) letrozole started 03/15/2016, palbociclib added 03/29/2016 at 125 mg/day, 21/7  (a) dose decreased to 100 mg per day, 21/7, beginning with February cycle  (b) palbociclib held 01/31/2017, with increasing symptoms  (c) palbociclib resumed October 2018 at 75 mg daily  (d) palbociclib dose reduced to 75 mg every other day February through April 2019  (e) palbociclib dose resumed at 75 mg daily as of 10/17/2017  (f) palbociclib dose decreased to 75 mg every other day beginning 07/31/2019  (g) palbociclib resumed at 75 mg daily, 21 days on 7 off, as of 08/25/2019  (5) status post double right lumpectomies and right axillary lymph node sampling 01/13/2017 for 2 separate invasive ductal carcinoma lesions, pT1a and pT1b, N1a, with negative margins, both lesions being estrogen and progesterone receptor positive and HER-2 negative  (6) adjuvant radiation completed 07/05/2017 1. 50.4 Gy in 28 fractions to the right breast and supraclavicular region using whole-breast tangent fields. 2. Boost to the seroma delivered an additional 10 Gy in 5 fractions. The total dose was 60.4 Gy.  (7) restaging studies:  (a) CT scan of the chest and bone scan 06/23/2017 showed stable scattered very small lung nodules, no bone lesions  (b) CT of the chest 10/17/2017 showed no new or progressive metastatic disease in the chest. The small left lower lobe pulmonary nodule is stable  (c) PET scan on 03/02/2018: shows no findings for residual or recurrent right  breast cancer  (d) chest CT scan stable, questionable right renal cyst noted  (e) chest CT 09/18/2020 shows no measurable disease  (f) to the CT scan 04/29/2021 shows no evidence of active disease; a 2 cm thyroid nodule was noted   (8) right upper pole renal lesion noted to be enlarging on CT scan 06/22/2017  (a) no uptake on PET scan obtained 03/02/2018  #9 Most recent imaging with no new suspicious  mass or lymphadenopathy identified in the chest abdomen or pelvis.  Stable chronic pleural and parenchymal scarring densities in the right lung apex.  Stable chronic moderate to severe biliary ductal dilatation.  Stable chronic 11 mm left adrenal gland nodule.  No dedicated follow-up required   PLAN:  She continues on Ibrance 75 mg once daily 3 weeks on 1 week off and letrozole daily.   Assessment & Plan Fatigue and Dyspnea Fatigue and dyspnea potentially due to letrozole, cardiovascular issues, uncontrolled diabetes, and post-viral deconditioning. Cardiovascular causes require further exploration. - Encourage gradual increase in physical activity, starting with ambulation around the house. - Consider echocardiogram if dyspnea persists.  Uncontrolled Diabetes Mellitus Blood glucose at 354 mg/dL, likely elevated hemoglobin A1c. Current metformin regimen insufficient for glycemic control. - Consult with primary care provider for potential adjustment of diabetes medication regimen.  Breast Cancer (in remission) Breast cancer in remission, on Ibrance and letrozole. Follow-up schedule requires adjustment for Ibrance cycle. Most recent imaging with no evidence of disease progression - Adjust follow-up schedule to ensure appointments occur before starting the next cycle of Ibrance.  Myalgia and Muscle Cramps Myalgia and muscle cramps in chest area affecting breathing. Stretching recommended. - Encourage regular stretching exercises to relieve muscle cramps.  Thyroid nodules, stable Repeat US  in one yr.  Primary Care Provider Change Dissatisfaction with current primary care provider. Advised to Geophysicist/field seismologist for change. - Contact the Engineer, manufacturing at the primary care office to request a change of provider.  Total time spent: 30 min  Thank you for consulting [place] in the care of this patient.  Please not hesitate contact us with any additional questions or concerns.  *Total Encounter Time as defined by the Centers for Medicare and Medicaid Services includes, in addition to the face-to-face time of a patient visit (documented in the note above) non-face-to-face time: obtaining and reviewing outside history, ordering and reviewing medications, tests or procedures, care coordination (communications with other health care professionals or caregivers) and documentation in the medical record.  Rachel Moulds MD

## 2023-09-20 DIAGNOSIS — H35353 Cystoid macular degeneration, bilateral: Secondary | ICD-10-CM | POA: Diagnosis not present

## 2023-09-20 LAB — CANCER ANTIGEN 27.29: CA 27.29: 48.7 U/mL — ABNORMAL HIGH (ref 0.0–38.6)

## 2023-09-21 ENCOUNTER — Other Ambulatory Visit: Payer: Self-pay | Admitting: Internal Medicine

## 2023-09-21 MED ORDER — PIOGLITAZONE HCL-METFORMIN HCL 15-850 MG PO TABS: 1.0000 | ORAL_TABLET | Freq: Every day | ORAL | 3 refills | Status: AC

## 2023-09-24 ENCOUNTER — Other Ambulatory Visit: Payer: Self-pay | Admitting: Internal Medicine

## 2023-09-29 ENCOUNTER — Other Ambulatory Visit: Payer: Self-pay

## 2023-09-29 NOTE — Progress Notes (Signed)
 Specialty Pharmacy Refill Coordination Note  [name] is a 78 y.o. female contacted today regarding refills of specialty medication(s) Palbociclib  (IBRANCE )   Patient requested [name] at [facility] at [place] date: 10/02/23   Medication will be filled on 09/29/23.

## 2023-10-13 ENCOUNTER — Telehealth: Payer: Self-pay | Admitting: Hematology and Oncology

## 2023-10-13 NOTE — Telephone Encounter (Signed)
 [name]

## 2023-10-18 ENCOUNTER — Encounter (HOSPITAL_COMMUNITY): Payer: Self-pay

## 2023-10-24 ENCOUNTER — Other Ambulatory Visit (HOSPITAL_COMMUNITY): Payer: Self-pay

## 2023-10-24 NOTE — Progress Notes (Signed)
 Specialty Pharmacy Ongoing Clinical Assessment Note  [name] is a 78 y.o. female who is being followed by the specialty pharmacy service for RxSp Oncology   Patient's specialty medication(s) reviewed today: Palbociclib  (IBRANCE )   Missed doses in the last 4 weeks: 0   Patient/Caregiver did not have any additional questions or concerns.   Therapeutic benefit summary: Patient is achieving benefit   Adverse events/side effects summary: No adverse events/side effects   Patient's therapy is appropriate to: Continue    Goals Addressed             This Visit's Progress    Stabilization of disease   On track    Patient is on track. Patient will maintain adherence.  Per 09/19/23 office visit note, the most recent CT scan continued to show stable disease.         Follow up: 3 months  Malachi Screws Specialty Pharmacist

## 2023-10-25 ENCOUNTER — Ambulatory Visit
Admission: RE | Admit: 2023-10-25 | Discharge: 2023-10-25 | Disposition: A | Discharge status: Home / Self Care | Payer: PPO | Source: Ambulatory Visit | Attending: Hematology and Oncology | Admitting: Hematology and Oncology

## 2023-10-25 DIAGNOSIS — Z1231 Encounter for screening mammogram for malignant neoplasm of breast: Secondary | ICD-10-CM | POA: Diagnosis not present

## 2023-10-25 DIAGNOSIS — C50411 Malignant neoplasm of upper-outer quadrant of right female breast: Secondary | ICD-10-CM

## 2023-10-30 DIAGNOSIS — E1169 Type 2 diabetes mellitus with other specified complication: Secondary | ICD-10-CM | POA: Diagnosis not present

## 2023-10-30 DIAGNOSIS — H401134 Primary open-angle glaucoma, bilateral, indeterminate stage: Secondary | ICD-10-CM | POA: Diagnosis not present

## 2023-10-30 DIAGNOSIS — H348322 Tributary (branch) retinal vein occlusion, left eye, stable: Secondary | ICD-10-CM | POA: Diagnosis not present

## 2023-10-30 DIAGNOSIS — Z961 Presence of intraocular lens: Secondary | ICD-10-CM | POA: Diagnosis not present

## 2023-10-30 DIAGNOSIS — H35369 Drusen (degenerative) of macula, unspecified eye: Secondary | ICD-10-CM | POA: Diagnosis not present

## 2023-10-31 ENCOUNTER — Other Ambulatory Visit: Payer: Self-pay

## 2023-10-31 NOTE — Progress Notes (Signed)
 Specialty Pharmacy Refill Coordination Note  [name] is a 78 y.o. female contacted today regarding refills of specialty medication(s) Palbociclib  (IBRANCE )   Patient requested Delivery   Pickup date: 11/02/23   Medication will be filled on 11/01/23.

## 2023-11-01 ENCOUNTER — Telehealth: Payer: Self-pay | Admitting: *Deleted

## 2023-11-01 NOTE — Telephone Encounter (Signed)
 This RN spoke to pt per VM stating she was seen by the eye doctor and was told "my brain went to sleep".  Per further inquiry- [name] states she was having a "eye field" test and she didn't notice any issues - but when the reading was complete there was an area in the right field that showed she didn't see the flashing lights.   The eye doctor told her " it is because your brain fell asleep" and really didn't explain further.  She went home and looked it up and saw possible indication of stroke (which she has had remotely).  This  RN reviewed above - with [name] denying any balance issues, focal weakness in body, changes or delays in speech indicative of stroke and or brain mets.  She is asking if being on letrozole  and ibrance  for 8 years could cause the above. This RN explained visual changes is not a known side direct side effect of the medications but may be more related to aging as well as her known diabetes.  She feels her vision is normal.  She will be seeing her retinal specialist on 11/22/2023 who will do further evaluation.  Plan per discussion is pt will monitor for any changes and understands to call if needed- informed her if needed we could obtain a brain MRI if indicated.  This note will be forwarded to MD for review.

## 2023-11-22 DIAGNOSIS — H35353 Cystoid macular degeneration, bilateral: Secondary | ICD-10-CM | POA: Diagnosis not present

## 2023-11-24 ENCOUNTER — Other Ambulatory Visit: Payer: Self-pay

## 2023-11-24 NOTE — Progress Notes (Signed)
 Specialty Pharmacy Refill Coordination Note  [name] is a 78 y.o. female contacted today regarding refills of specialty medication(s) Palbociclib  (IBRANCE )   Patient requested [name] at [facility] at [place] date: 11/27/23   Medication will be filled on 11/24/23.

## 2023-11-28 ENCOUNTER — Other Ambulatory Visit (HOSPITAL_COMMUNITY): Payer: Self-pay

## 2023-12-01 ENCOUNTER — Other Ambulatory Visit: Payer: Self-pay | Admitting: Hematology and Oncology

## 2023-12-12 ENCOUNTER — Other Ambulatory Visit

## 2023-12-12 ENCOUNTER — Ambulatory Visit: Admitting: Hematology and Oncology

## 2023-12-22 ENCOUNTER — Other Ambulatory Visit: Payer: Self-pay | Admitting: Internal Medicine

## 2023-12-25 ENCOUNTER — Other Ambulatory Visit: Payer: Self-pay | Admitting: Pharmacy Technician

## 2023-12-25 ENCOUNTER — Other Ambulatory Visit: Payer: Self-pay

## 2023-12-25 NOTE — Progress Notes (Signed)
 Specialty Pharmacy Refill Coordination Note  [name] is a 78 y.o. female contacted today regarding refills of specialty medication(s) Palbociclib  (IBRANCE )   Patient requested [name] at [facility] at [place] date: 01/02/24   Medication will be filled on 01/01/24.

## 2023-12-28 ENCOUNTER — Other Ambulatory Visit: Payer: Self-pay

## 2024-01-01 ENCOUNTER — Telehealth: Payer: Self-pay

## 2024-01-01 NOTE — Telephone Encounter (Signed)
 Patient confirmed

## 2024-01-02 ENCOUNTER — Inpatient Hospital Stay (HOSPITAL_BASED_OUTPATIENT_CLINIC_OR_DEPARTMENT_OTHER): Admitting: Hematology and Oncology

## 2024-01-02 ENCOUNTER — Inpatient Hospital Stay: Attending: Hematology and Oncology

## 2024-01-02 ENCOUNTER — Other Ambulatory Visit: Payer: Self-pay | Admitting: *Deleted

## 2024-01-02 ENCOUNTER — Inpatient Hospital Stay

## 2024-01-02 VITALS — BP 175/66 | HR 64 | Temp 98.4°F | Resp 18 | Wt 232.5 lb

## 2024-01-02 DIAGNOSIS — Z9221 Personal history of antineoplastic chemotherapy: Secondary | ICD-10-CM | POA: Diagnosis not present

## 2024-01-02 DIAGNOSIS — C50411 Malignant neoplasm of upper-outer quadrant of right female breast: Secondary | ICD-10-CM | POA: Diagnosis not present

## 2024-01-02 DIAGNOSIS — Z170 Estrogen receptor positive status [ER+]: Secondary | ICD-10-CM

## 2024-01-02 DIAGNOSIS — Z1732 Human epidermal growth factor receptor 2 negative status: Secondary | ICD-10-CM | POA: Diagnosis not present

## 2024-01-02 DIAGNOSIS — C7802 Secondary malignant neoplasm of left lung: Secondary | ICD-10-CM | POA: Diagnosis not present

## 2024-01-02 DIAGNOSIS — C7800 Secondary malignant neoplasm of unspecified lung: Secondary | ICD-10-CM

## 2024-01-02 DIAGNOSIS — D701 Agranulocytosis secondary to cancer chemotherapy: Secondary | ICD-10-CM | POA: Insufficient documentation

## 2024-01-02 DIAGNOSIS — J984 Other disorders of lung: Secondary | ICD-10-CM | POA: Insufficient documentation

## 2024-01-02 DIAGNOSIS — Z801 Family history of malignant neoplasm of trachea, bronchus and lung: Secondary | ICD-10-CM | POA: Diagnosis not present

## 2024-01-02 DIAGNOSIS — Z1721 Progesterone receptor positive status: Secondary | ICD-10-CM | POA: Diagnosis not present

## 2024-01-02 DIAGNOSIS — Z803 Family history of malignant neoplasm of breast: Secondary | ICD-10-CM | POA: Insufficient documentation

## 2024-01-02 DIAGNOSIS — Z90710 Acquired absence of both cervix and uterus: Secondary | ICD-10-CM | POA: Diagnosis not present

## 2024-01-02 DIAGNOSIS — C7801 Secondary malignant neoplasm of right lung: Secondary | ICD-10-CM | POA: Insufficient documentation

## 2024-01-02 DIAGNOSIS — Z923 Personal history of irradiation: Secondary | ICD-10-CM | POA: Insufficient documentation

## 2024-01-02 DIAGNOSIS — Z79811 Long term (current) use of aromatase inhibitors: Secondary | ICD-10-CM | POA: Diagnosis not present

## 2024-01-02 DIAGNOSIS — Z8542 Personal history of malignant neoplasm of other parts of uterus: Secondary | ICD-10-CM | POA: Insufficient documentation

## 2024-01-02 DIAGNOSIS — R32 Unspecified urinary incontinence: Secondary | ICD-10-CM | POA: Diagnosis not present

## 2024-01-02 DIAGNOSIS — E278 Other specified disorders of adrenal gland: Secondary | ICD-10-CM | POA: Insufficient documentation

## 2024-01-02 DIAGNOSIS — R5383 Other fatigue: Secondary | ICD-10-CM | POA: Diagnosis not present

## 2024-01-02 DIAGNOSIS — N644 Mastodynia: Secondary | ICD-10-CM | POA: Diagnosis not present

## 2024-01-02 LAB — CBC WITH DIFFERENTIAL (CANCER CENTER ONLY)
Abs Immature Granulocytes: 0.05 K/uL (ref 0.00–0.07)
Basophils Absolute: 0 K/uL (ref 0.0–0.1)
Basophils Relative: 1 %
Eosinophils Absolute: 0 K/uL (ref 0.0–0.5)
Eosinophils Relative: 1 %
HCT: 37.8 % (ref 36.0–46.0)
Hemoglobin: 12.7 g/dL (ref 12.0–15.0)
Immature Granulocytes: 2 %
Lymphocytes Relative: 32 %
Lymphs Abs: 0.8 K/uL (ref 0.7–4.0)
MCH: 32.6 pg (ref 26.0–34.0)
MCHC: 33.6 g/dL (ref 30.0–36.0)
MCV: 97.2 fL (ref 80.0–100.0)
Monocytes Absolute: 0.4 K/uL (ref 0.1–1.0)
Monocytes Relative: 14 %
Neutro Abs: 1.3 K/uL — ABNORMAL LOW (ref 1.7–7.7)
Neutrophils Relative %: 50 %
Platelet Count: 142 K/uL — ABNORMAL LOW (ref 150–400)
RBC: 3.89 MIL/uL (ref 3.87–5.11)
RDW: 14.7 % (ref 11.5–15.5)
WBC Count: 2.6 K/uL — ABNORMAL LOW (ref 4.0–10.5)
nRBC: 0 % (ref 0.0–0.2)

## 2024-01-02 LAB — CMP (CANCER CENTER ONLY)
ALT: 13 U/L (ref 0–44)
AST: 13 U/L — ABNORMAL LOW (ref 15–41)
Albumin: 4.4 g/dL (ref 3.5–5.0)
Alkaline Phosphatase: 61 U/L (ref 38–126)
Anion gap: 7 (ref 5–15)
BUN: 16 mg/dL (ref 8–23)
CO2: 27 mmol/L (ref 22–32)
Calcium: 9.2 mg/dL (ref 8.9–10.3)
Chloride: 106 mmol/L (ref 98–111)
Creatinine: 0.67 mg/dL (ref 0.44–1.00)
GFR, Estimated: >60 mL/min (ref >60)
Glucose, Bld: 175 mg/dL — ABNORMAL HIGH (ref 70–99)
Potassium: 4.5 mmol/L (ref 3.5–5.1)
Sodium: 140 mmol/L (ref 135–145)
Total Bilirubin: 0.5 mg/dL (ref 0.0–1.2)
Total Protein: 7 g/dL (ref 6.5–8.1)

## 2024-01-02 NOTE — Progress Notes (Signed)
 [facility]  Telephone:([phone]) [phone] Fax:(336) [phone]     ID: [name] DOB: 1946/01/25  MR#: [id number]  RDW#:[id number]  Patient Care Team: Rollene [name] LABOR, MD as PCP - General (Internal Medicine) Ethyl Lenis, MD as Consulting Physician (General Surgery) Dewey Rush, MD as Consulting Physician (Radiation Oncology) Sarrah Browning, MD as Consulting Physician (Obstetrics and Gynecology) Marleen [name] SAUNDERS, MD as Referring Physician (Specialist) Shelah Lamar RAMAN, MD as Consulting Physician (Pulmonary Disease) Alvaro Ricardo [name] [name]., MD as Consulting Physician (Urology) [name], [name] Pac, MD as Referring Physician (Ophthalmology) Lucila [name] LABOR, RPH-CPP (Pharmacist) Austin Olam CROME, MD as Consulting Physician (Ophthalmology) Loretha Ash, MD as Consulting Physician (Hematology and Oncology)  CHIEF COMPLAINT: Estrogen receptor positive breast cancer  CURRENT TREATMENT: Letrozole , palbociclib   INTERVAL HISTORY:  [name] returns today for follow-up of her estrogen receptor positive stage IV breast cancer.    Discussed the use of AI scribe software for clinical note transcription with the patient, who gave verbal consent to proceed.  History of Present Illness [name] is a 78 year old female with breast cancer and diabetes who is here for follow up.  Discussed the use of AI scribe software for clinical note transcription with the patient, who gave verbal consent to proceed.  History of Present Illness     Rest of the pertinent 10 point ROS reviewed and negative.  We are continuing to follow her tumor marker: Lab Results  Component Value Date   CA2729 48.7 (H) 09/19/2023   CA2729 35.9 06/26/2023   CA2729 33.2 03/28/2023   CA2729 26.1 09/14/2022   CA2729 28.7 06/21/2022    REVIEW OF SYSTEMS:   A detailed review of systems today was otherwise stable.   COVID 19 VACCINATION STATUS: Status post Pfizer x2 followed by booster August 2021; had  COVID November 2022   BREAST CANCER HISTORY: From the original intake note:  [name] had screening mammography showing some suspicious calcifications in the right breast leading to right diagnostic mammography with ultrasonography 01/22/2016 at [facility]. The breast density was category C. In the upper right breast there was a 2.3 cm mass with additional masses measuring 0.9 and 0.7 cm. There was also a possible additional 0.8 mass in the lower inner quadrant. Ultrasound confirmed an irregular hypoechoic mass in the right breast upper outer quadrant measuring 2.0 cm. There were other masses measuring 0.7 and 0.8 cm by ultrasonography. The right axilla was sonographically benign.  Biopsy of a 12:00 and 4:00 mass in the right breast 01/22/2016 showed ([id number]) both specimens showing invasive ductal carcinoma, grade 1 or 2, both 95% estrogen receptor positive, both 95% progesterone receptor positive, both with strong staining intensity, with MIB-1 ranging from 10-15%, and both HER-2 negative, the signals ratio being 1.23-1.42, and the number per cell 1.85-2.59.  Her subsequent history is as detailed below   PAST MEDICAL HISTORY: Past Medical History:  Diagnosis Date   Breast cancer (HCC)    Cancer (HCC) 02/2016   right breast   DIABETES MELLITUS, TYPE II 01/04/2007   only takes actoplus daily   Dizziness and giddiness 02/29/2008   DVT, HX OF    at age 74 in right buttocks   Dyspnea    due to lung cancer   Family history of breast cancer    GERD 01/04/2007   pt reports resolved    GLAUCOMA 07/30/2008   both eyes   History of blood transfusion    no abnormal  reaction   History  of uterine cancer 2000   hysterectomy done   HYPERLIPIDEMIA 01/04/2007   taking Pravastatin  daily   HYPERTENSION 01/04/2007   takes Lisinopril  daily   Joint pain    Joint swelling    Leg cramps    LEG PAIN, LEFT 07/06/2007   NUMBNESS 07/30/2008   in fingers;pt states from Diamox    OSTEOARTHRITIS, HIP 09/25/2009    OTITIS MEDIA, ACUTE, BILATERAL 02/29/2008   Overweight(278.02) 01/04/2007   Peripheral vascular disease (HCC)    Personal history of radiation therapy 2018   Pneumonia    PONV (postoperative nausea and vomiting)    SLEEP APNEA, OBSTRUCTIVE    doesn't use a cpap;study done about 58yrs ago   TRANSIENT ISCHEMIC ATTACK, HX OF 01/04/2007   Vision loss    left eye    PAST SURGICAL HISTORY: Past Surgical History:  Procedure Laterality Date   ABDOMINAL HYSTERECTOMY  2000   BREAST LUMPECTOMY Right 01/13/2017   x2   BREAST LUMPECTOMY WITH RADIOACTIVE SEED AND SENTINEL LYMPH NODE BIOPSY Right 01/13/2017   Procedure: RIGHT BREAST RADIOACTIVE SEED X'S 2 GUIDED LUMPECTOMY WITH RADIOACTIVE SEED TARGETED AXILLARYLYMPH NODE EXCISION AND RIGHT AXILLARY SENTINEL LYMPH NODE BIOPSY;  Surgeon: Ethyl Lenis, MD;  Location: [facility] OR;  Service: General;  Laterality: Right;  2 SEEDS IN RIGHT BREAST 1 SEED IN RIGHT AXILLARY NODE   CHOLECYSTECTOMY     ENDOBRONCHIAL ULTRASOUND Bilateral 03/28/2016   Procedure: ENDOBRONCHIAL ULTRASOUND;  Surgeon: Lamar [name] Chris, MD;  Location: [facility];  Service: Cardiopulmonary;  Laterality: Bilateral;   EYE SURGERY  13   shunt left and lazer eye surgery on right cataract and retenia tear with repair   growth removal  2004   from thumb   KNEE ARTHROSCOPY Right    mulitple eye surgeries     both eyes, cataracts with ioc done both eyes   OOPHORECTOMY     right lumpectomy with axillary node dissection Right 01/2017   TOTAL HIP ARTHROPLASTY  06/24/2011   Procedure: TOTAL HIP ARTHROPLASTY;  Surgeon: [name] [name];  Location: [facility] OR;  Service: Orthopedics;  Laterality: Right;   TOTAL HIP ARTHROPLASTY Left 11/12/2012   Dr Obrien   TOTAL HIP ARTHROPLASTY Left 11/12/2012   Procedure: TOTAL HIP ARTHROPLASTY;  Surgeon: [name] Shannon Sensor, MD;  Location: [facility] OR;  Service: Orthopedics;  Laterality: Left;  DEPUY PINNACLE    FAMILY HISTORY Family History  Problem Relation Age of Onset    Breast cancer Mother 94   Dementia Mother    Cancer Mother        Breast and lung cancer   Stroke Sister    Breast cancer Sister 26   Heart attack Maternal Aunt    Lung cancer Maternal Grandmother        non smoker   Glaucoma Maternal Grandfather    Anesthesia problems Neg Hx   The patient's father died at age 91, the patient's mother died at age 46. She had breast and lung cancers diagnosed shortly before her death. The patient had no brothers, 2 sisters. One sister was diagnosed with breast cancer at the age of 45.   GYNECOLOGIC HISTORY:  No LMP recorded. Patient has had a hysterectomy. Menarche age 8, first live birth age 82, the patient is GX P1. She had a hysterectomy for endometrial cancer in the year 2000. She did not take hormone replacement. She did use oral contraceptives for more than 20 years remotely, with no complications.   SOCIAL HISTORY: (Updated July 2021). 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 She is home with her husband Debby. He is a retired Curator.Their son Carlin also lives in [place].  The patient has 3 grandchildren aged 10, 14 and 78    ADVANCED DIRECTIVES: In place   HEALTH MAINTENANCE: Social History   Tobacco Use   Smoking status: Never   Smokeless tobacco: Never  Vaping Use   Vaping status: Never Used  Substance Use Topics   Alcohol use: No   Drug use: No     Colonoscopy: Never  PAP: Status post hysterectomy  Bone density: Remote   Allergies  Allergen Reactions   Codeine  Hives    Hycodan syrup   Fluorescein  Nausea And Vomiting    ? IV dye for retina specialist   Lipitor [Atorvastatin  Calcium ]     Leg cramp   Oxycodone  Nausea And Vomiting    Patient vomited for 3 days after taking   Sitagliptin Phosphate Nausea And Vomiting   Sulfa Drugs Cross Reactors Nausea And Vomiting    Current Outpatient Medications  Medication Sig Dispense Refill   acetaminophen   (TYLENOL ) 500 MG tablet Take 1,000 mg by mouth every 4 (four) hours as needed for moderate pain or fever.     aspirin  EC 81 MG tablet Take 81 mg by mouth daily at 6 PM. 1700     Biotin 1 MG CAPS Take by mouth.     brimonidine  (ALPHAGAN ) 0.2 % ophthalmic solution INSTILL 1 DROP INTO EACH EYE THREE TIMES DAILY     cholecalciferol (VITAMIN D) 1000 units tablet Take 1,000 Units by mouth daily.     dorzolamide-timolol  (COSOPT) 22.3-6.8 MG/ML ophthalmic solution Place 1 drop into both eyes 2 (two) times daily.     gabapentin  (NEURONTIN ) 100 MG capsule Take 200 mg at bedtime, can increase to 300 mg at bedtime if tolerated 90 capsule 3   ketorolac (ACULAR) 0.5 % ophthalmic solution Place 1 drop into the right eye 2 (two) times daily.     letrozole  (FEMARA ) 2.5 MG tablet Take 1 tablet by mouth once daily 90 tablet 0   lisinopril  (ZESTRIL ) 5 MG tablet Take 1 tablet by mouth once daily 90 tablet 0   lovastatin  (MEVACOR ) 20 MG tablet Take 1 tablet (20 mg total) by mouth at bedtime. 90 tablet 3   meloxicam  (MOBIC ) 15 MG tablet Take 1 tablet (15 mg total) by mouth daily. Take with food 30 tablet 0   Netarsudil-Latanoprost (ROCKLATAN ) 0.02-0.005 % SOLN Apply to eye.     palbociclib  (IBRANCE ) 75 MG tablet Take 1 tablet (75 mg total) by mouth daily. Take for 21 days on, 7 days off, repeat every 28 days. 21 tablet 11   pioglitazone -metformin  (ACTOPLUS MET ) 15-850 MG tablet Take 1 tablet by mouth daily. 90 tablet 3   prednisoLONE  acetate (PRED FORTE ) 1 % ophthalmic suspension SMARTSIG:1 In Eye(s) 6 Times Daily     No current facility-administered medications for this visit.     OBJECTIVE: white woman in no acute distress  Vitals:   01/02/24 1138  BP: (!) 175/66  Pulse: 64  Resp: 18  Temp: 98.4 F (36.9 C)  SpO2: 100%    Wt Readings from Last 3 Encounters:  01/02/24 232 lb 8 oz (105.5 kg)  09/19/23 229 lb 8 oz (104.1 kg)  08/25/23 230 lb (104.3 kg)   Body mass index is 39.91 kg/m.    ECOG FS:1 -  Symptomatic but completely ambulatory  Physical Exam Constitutional:      Appearance: Normal appearance.  Cardiovascular:     Rate and Rhythm: Normal rate and regular rhythm.     Pulses: Normal pulses.     Heart sounds: Normal heart sounds.  Pulmonary:     Effort: Pulmonary effort is normal.     Breath sounds: Normal breath sounds.  Musculoskeletal:        General: No swelling. Normal range of motion.     Cervical back: Normal range of motion and neck supple. No rigidity.  Lymphadenopathy:     Cervical: No cervical adenopathy.  Skin:    General: Skin is warm and dry.  Neurological:     General: No focal deficit present.     Mental Status: She is alert.  Psychiatric:        Mood and Affect: Mood normal.      LAB RESULTS:  CMP     Component Value Date/Time   NA 136 09/19/2023 1035   NA 140 05/29/2017 1254   K 4.4 09/19/2023 1035   K 4.4 05/29/2017 1254   CL 104 09/19/2023 1035   CO2 26 09/19/2023 1035   CO2 25 05/29/2017 1254   GLUCOSE 354 (H) 09/19/2023 1035   GLUCOSE 154 (H) 05/29/2017 1254   BUN 15 09/19/2023 1035   BUN 11.5 05/29/2017 1254   CREATININE 0.80 09/19/2023 1035   CREATININE 0.8 05/29/2017 1254   CALCIUM  9.2 09/19/2023 1035   CALCIUM  9.3 05/29/2017 1254   PROT 7.0 09/19/2023 1035   PROT 7.0 05/29/2017 1254   ALBUMIN  4.3 09/19/2023 1035   ALBUMIN  4.1 05/29/2017 1254   AST 13 (L) 09/19/2023 1035   AST 13 05/29/2017 1254   ALT 16 09/19/2023 1035   ALT 14 05/29/2017 1254   ALKPHOS 70 09/19/2023 1035   ALKPHOS 66 05/29/2017 1254   BILITOT 0.5 09/19/2023 1035   BILITOT 0.50 05/29/2017 1254   GFRNONAA >60 09/19/2023 1035   GFRAA >60 03/10/2020 1138    INo results found for: SPEP, UPEP  Lab Results  Component Value Date   WBC 2.6 (L) 01/02/2024   NEUTROABS 1.3 (L) 01/02/2024   HGB 12.7 01/02/2024   HCT 37.8 01/02/2024   MCV 97.2 01/02/2024   PLT 142 (L) 01/02/2024      Chemistry      Component Value Date/Time   NA 136 09/19/2023  1035   NA 140 05/29/2017 1254   K 4.4 09/19/2023 1035   K 4.4 05/29/2017 1254   CL 104 09/19/2023 1035   CO2 26 09/19/2023 1035   CO2 25 05/29/2017 1254   BUN 15 09/19/2023 1035   BUN 11.5 05/29/2017 1254   CREATININE 0.80 09/19/2023 1035   CREATININE 0.8 05/29/2017 1254      Component Value Date/Time   CALCIUM  9.2 09/19/2023 1035   CALCIUM  9.3 05/29/2017 1254   ALKPHOS 70 09/19/2023 1035   ALKPHOS 66 05/29/2017 1254   AST 13 (L) 09/19/2023 1035   AST 13 05/29/2017 1254   ALT 16 09/19/2023 1035   ALT 14 05/29/2017 1254   BILITOT 0.5 09/19/2023 1035   BILITOT 0.50 05/29/2017 1254       No results found for: LABCA2  No components found for: LABCA125  No results for input(s): INR in the last 168 hours.  Urinalysis    Component Value Date/Time   COLORURINE YELLOW 11/25/2022 1005   APPEARANCEUR CLEAR 11/25/2022 1005   LABSPEC 1.025 11/25/2022 1005   PHURINE 6.0 11/25/2022 1005  GLUCOSEU NEGATIVE 11/25/2022 1005   HGBUR NEGATIVE 11/25/2022 1005   BILIRUBINUR NEGATIVE 11/25/2022 1005   KETONESUR NEGATIVE 11/25/2022 1005   PROTEINUR 30 (A) 08/18/2017 1322   UROBILINOGEN 0.2 11/25/2022 1005   NITRITE POSITIVE (A) 11/25/2022 1005   LEUKOCYTESUR MODERATE (A) 11/25/2022 1005    STUDIES: No results found.    ELIGIBLE FOR AVAILABLE RESEARCH PROTOCOL: no  ASSESSMENT: 78 y.o. [place] woman with a remote history of early stage endometrial cancer, subsequently status post right breast upper outer quadrant biopsy 01/22/2016 for a clinically multifocal T2 N0, stage 2A invasive ductal carcinoma, grade 1, estrogen and progesterone receptor positive, HER-2 negative, with an MIB-1 between 10 and 15%.  (1) right axillary lymph node biopsy 03/02/2016 positive  (2) genetics testing 01/13/2016 through the Custom gene panel offered by [facility] found no deleterious mutations in  ATM, BARD1, BRCA1, BRCA2, BRIP1, CDH1, CHEK2, EPCAM, FANCC, MLH1, MSH2, MSH6, MUTYH, NBN, PALB2,  PMS2, POLD1, PTEN, RAD51C, RAD51D, TP53, and XRCC2  METASTATIC DISEASE: OCT 2017 (3) CT scans of the chest abdomen and pelvis obtained 03/10/2016 are consistent with bilateral lung metastases and mediastinal and hilar nodal involvement, but no liver or bone spread  (a) bronchoscopic lymph node biopsy 2 (station 7, 13R) 03/28/2016 confirms metastatic adenocarcinoma, estrogen receptor positive, HER-2 not amplified  (b) baseline CA-27-29 on 04/18/2016 was 137.5.  (4) letrozole  started 03/15/2016, palbociclib  added 03/29/2016 at 125 mg/day, 21/7  (a) dose decreased to 100 mg per day, 21/7, beginning with February cycle  (b) palbociclib  held 01/31/2017, with increasing symptoms  (c) palbociclib  resumed October 2018 at 75 mg daily  (d) palbociclib  dose reduced to 75 mg every other day February through April 2019  (e) palbociclib  dose resumed at 75 mg daily as of 10/17/2017  (f) palbociclib  dose decreased to 75 mg every other day beginning 07/31/2019  (g) palbociclib  resumed at 75 mg daily, 21 days on 7 off, as of 08/25/2019  (5) status post double right lumpectomies and right axillary lymph node sampling 01/13/2017 for 2 separate invasive ductal carcinoma lesions, pT1a and pT1b, N1a, with negative margins, both lesions being estrogen and progesterone receptor positive and HER-2 negative  (6) adjuvant radiation completed 07/05/2017 1. 50.4 Gy in 28 fractions to the right breast and supraclavicular region using whole-breast tangent fields. 2. Boost to the seroma delivered an additional 10 Gy in 5 fractions. The total dose was 60.4 Gy.  (7) restaging studies:  (a) CT scan of the chest and bone scan 06/23/2017 showed stable scattered very small lung nodules, no bone lesions  (b) CT of the chest 10/17/2017 showed no new or progressive metastatic disease in the chest. The small left lower lobe pulmonary nodule is stable  (c) PET scan on 03/02/2018: shows no findings for residual or recurrent right  breast cancer  (d) chest CT scan stable, questionable right renal cyst noted  (e) chest CT 09/18/2020 shows no measurable disease  (f) to the CT scan 04/29/2021 shows no evidence of active disease; a 2 cm thyroid  nodule was noted   (8) right upper pole renal lesion noted to be enlarging on CT scan 06/22/2017  (a) no uptake on PET scan obtained 03/02/2018  #9 Most recent imaging with no new suspicious mass or lymphadenopathy identified in the chest abdomen or pelvis.  Stable chronic pleural and parenchymal scarring densities in the right lung apex.  Stable chronic moderate to severe biliary ductal dilatation.  Stable chronic 11 mm left adrenal gland nodule.  No dedicated follow-up  required   PLAN:  She continues on Ibrance  75 mg once daily 3 weeks on 1 week off and letrozole  daily.   Assessment & Plan Fatigue and Dyspnea Fatigue and dyspnea potentially due to letrozole , cardiovascular issues, uncontrolled diabetes, and post-viral deconditioning. Cardiovascular causes require further exploration. - Encourage gradual increase in physical activity, starting with ambulation around the house. - Consider echocardiogram if dyspnea persists.  Uncontrolled Diabetes Mellitus Blood glucose at 354 mg/dL, likely elevated hemoglobin A1c. Current metformin  regimen insufficient for glycemic control. - Consult with primary care provider for potential adjustment of diabetes medication regimen.  Breast Cancer (in remission) Breast cancer in remission, on Ibrance  and letrozole . Follow-up schedule requires adjustment for Ibrance  cycle. Most recent imaging with no evidence of disease progression - Adjust follow-up schedule to ensure appointments occur before starting the next cycle of Ibrance .  Myalgia and Muscle Cramps Myalgia and muscle cramps in chest area affecting breathing. Stretching recommended. - Encourage regular stretching exercises to relieve muscle cramps.  Thyroid  nodules, stable Repeat US   in one yr.  Primary Care Provider Change Dissatisfaction with current primary care provider. Advised to Geophysicist/field seismologist for change. - Contact the Engineer, manufacturing at the primary care office to request a change of provider.  Total time spent: 30 min  Thank you for consulting us  in the care of this patient.  Please not hesitate contact us  with any additional questions or concerns.  *Total Encounter Time as defined by the Centers for Medicare and Medicaid Services includes, in addition to the face-to-face time of a patient visit (documented in the note above) non-face-to-face time: obtaining and reviewing outside history, ordering and reviewing medications, tests or procedures, care coordination (communications with other health care professionals or caregivers) and documentation in the medical record.  Amber Stalls MD

## 2024-01-02 NOTE — Progress Notes (Signed)
 [facility]  [phone] [phone]     ID: [name] DOB: 1946-04-24  MR#: [id number]  RDW#:[id number]  Patient Care Team: Rollene Almarie LABOR, MD as PCP - General (Internal Medicine) Ethyl Lenis, MD as Consulting Physician (General Surgery) Dewey Rush, MD as Consulting Physician (Radiation Oncology) Sarrah Browning, MD as Consulting Physician (Obstetrics and Gynecology) Marleen Redell SAUNDERS, MD as Referring Physician (Specialist) Shelah Lamar RAMAN, MD as Consulting Physician (Pulmonary Disease) Alvaro Ricardo KATHEE Raddle., MD as Consulting Physician (Urology) Nilsa, Dempsey Pac, MD as Referring Physician (Ophthalmology) Lucila Rush LABOR, RPH-CPP (Pharmacist) Austin Olam CROME, MD as Consulting Physician (Ophthalmology) Loretha Ash, MD as Consulting Physician (Hematology and Oncology)  CHIEF COMPLAINT: Estrogen receptor positive breast cancer  CURRENT TREATMENT: Letrozole , palbociclib   INTERVAL HISTORY:  [name] returns today for follow-up of her estrogen receptor positive stage IV breast cancer.    Discussed the use of AI scribe software for clinical note transcription with the patient, who gave verbal consent to proceed.  History of Present Illness  [name] is a 78 year old female with breast cancer who presents with increased breast pain.  She experiences increased pain in both breasts, particularly when turning to the right or when sitting in a chair at night. The pain is described as a 'catching' sensation under the breast, which is very sore to touch, leading her to avoid touching it. The pain is more frequent than before, although not every night.  She is currently taking letrozole  and Ibrance  for her breast cancer. Her recent mammogram was normal, and she is due for a CT scan in September.  She reports fatigue, although it is not as severe as during her last visit when she had a cold and sore throat. She is able to walk more, using a  treadmill and walking outside, and her breathing is not causing her significant issues.  She experiences urinary leakage in the mornings when standing up, which she manages by wearing absorbent panties. She has been doing Kegel exercises, though not daily, to help with this issue.  She reports that her blood pressure is normal when checked at home. No chest pain, shortness of breath, or palpitations. No trouble with bowel movements or urination, aside from occasional leakage.  Rest of the pertinent 10 point ROS reviewed and negative.  We are continuing to follow her tumor marker: Lab Results  Component Value Date   CA2729 48.7 (H) 09/19/2023   CA2729 35.9 06/26/2023   CA2729 33.2 03/28/2023   CA2729 26.1 09/14/2022   CA2729 28.7 06/21/2022    REVIEW OF SYSTEMS:   A detailed review of systems today was otherwise stable.   COVID 19 VACCINATION STATUS: Status post Pfizer x2 followed by booster August 2021; had COVID November 2022   BREAST CANCER HISTORY: From the original intake note:  [name] had screening mammography showing some suspicious calcifications in the right breast leading to right diagnostic mammography with ultrasonography 01/22/2016 at [place]. The breast density was category C. In the upper right breast there was a 2.3 cm mass with additional masses measuring 0.9 and 0.7 cm. There was also a possible additional 0.8 mass in the lower inner quadrant. Ultrasound confirmed an irregular hypoechoic mass in the right breast upper outer quadrant measuring 2.0 cm. There were other masses measuring 0.7 and 0.8 cm by ultrasonography. The right axilla was sonographically benign.  Biopsy of a 12:00 and 4:00 mass in the right breast 01/22/2016 showed ([id number]) both specimens showing  invasive ductal carcinoma, grade 1 or 2, both 95% estrogen receptor positive, both 95% progesterone receptor positive, both with strong staining intensity, with MIB-1 ranging from 10-15%, and both HER-2  negative, the signals ratio being 1.23-1.42, and the number per cell 1.85-2.59.  Her subsequent history is as detailed below   PAST MEDICAL HISTORY: Past Medical History:  Diagnosis Date   Breast cancer (HCC)    Cancer (HCC) 02/2016   right breast   DIABETES MELLITUS, TYPE II 01/04/2007   only takes actoplus daily   Dizziness and giddiness 02/29/2008   DVT, HX OF    at age 78 in right buttocks   Dyspnea    due to lung cancer   Family history of breast cancer    GERD 01/04/2007   pt reports resolved    GLAUCOMA 07/30/2008   both eyes   History of blood transfusion    no abnormal  reaction   History of uterine cancer 2000   hysterectomy done   HYPERLIPIDEMIA 01/04/2007   taking Pravastatin  daily   HYPERTENSION 01/04/2007   takes Lisinopril  daily   Joint pain    Joint swelling    Leg cramps    LEG PAIN, LEFT 07/06/2007   NUMBNESS 07/30/2008   in fingers;pt states from Diamox    OSTEOARTHRITIS, HIP 09/25/2009   OTITIS MEDIA, ACUTE, BILATERAL 02/29/2008   Overweight(278.02) 01/04/2007   Peripheral vascular disease (HCC)    Personal history of radiation therapy 2018   Pneumonia    PONV (postoperative nausea and vomiting)    SLEEP APNEA, OBSTRUCTIVE    doesn't use a cpap;study done about 74yrs ago   TRANSIENT ISCHEMIC ATTACK, HX OF 01/04/2007   Vision loss    left eye    PAST SURGICAL HISTORY: Past Surgical History:  Procedure Laterality Date   ABDOMINAL HYSTERECTOMY  2000   BREAST LUMPECTOMY Right 01/13/2017   x2   BREAST LUMPECTOMY WITH RADIOACTIVE SEED AND SENTINEL LYMPH NODE BIOPSY Right 01/13/2017   Procedure: RIGHT BREAST RADIOACTIVE SEED X'S 2 GUIDED LUMPECTOMY WITH RADIOACTIVE SEED TARGETED AXILLARYLYMPH NODE EXCISION AND RIGHT AXILLARY SENTINEL LYMPH NODE BIOPSY;  Surgeon: Ethyl Lenis, MD;  Location: [facility] OR;  Service: General;  Laterality: Right;  2 SEEDS IN RIGHT BREAST 1 SEED IN RIGHT AXILLARY NODE   CHOLECYSTECTOMY     ENDOBRONCHIAL ULTRASOUND Bilateral 03/28/2016    Procedure: ENDOBRONCHIAL ULTRASOUND;  Surgeon: Lamar GORMAN Chris, MD;  Location: [facility];  Service: Cardiopulmonary;  Laterality: Bilateral;   EYE SURGERY  13   shunt left and lazer eye surgery on right cataract and retenia tear with repair   growth removal  2004   from thumb   KNEE ARTHROSCOPY Right    mulitple eye surgeries     both eyes, cataracts with ioc done both eyes   OOPHORECTOMY     right lumpectomy with axillary node dissection Right 01/2017   TOTAL HIP ARTHROPLASTY  06/24/2011   Procedure: TOTAL HIP ARTHROPLASTY;  Surgeon: Dempsey JINNY Sensor;  Location: [facility] OR;  Service: Orthopedics;  Laterality: Right;   TOTAL HIP ARTHROPLASTY Left 11/12/2012   Dr Sensor   TOTAL HIP ARTHROPLASTY Left 11/12/2012   Procedure: TOTAL HIP ARTHROPLASTY;  Surgeon: Dempsey JINNY Sensor, MD;  Location: [facility] OR;  Service: Orthopedics;  Laterality: Left;  DEPUY PINNACLE    FAMILY HISTORY Family History  Problem Relation Age of Onset   Breast cancer Mother 42   Dementia Mother    Cancer Mother        Breast  and lung cancer   Stroke Sister    Breast cancer Sister 51   Heart attack Maternal Aunt    Lung cancer Maternal Grandmother        non smoker   Glaucoma Maternal Grandfather    Anesthesia problems Neg Hx   The patient's father died at age 54, the patient's mother died at age 23. She had breast and lung cancers diagnosed shortly before her death. The patient had no brothers, 2 sisters. One sister was diagnosed with breast cancer at the age of 36.   GYNECOLOGIC HISTORY:  No LMP recorded. Patient has had a hysterectomy. Menarche age 31, first live birth age 70, the patient is GX P1. She had a hysterectomy for endometrial cancer in the year 2000. She did not take hormone replacement. She did use oral contraceptives for more than 20 years remotely, with no complications.   SOCIAL HISTORY: (Updated July 2021). 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 She is home with her husband Debby. He is a retired Curator.Their son Carlin also lives in [place].  The patient has 3 grandchildren aged 79, 74 and 38    ADVANCED DIRECTIVES: In place   HEALTH MAINTENANCE: Social History   Tobacco Use   Smoking status: Never   Smokeless tobacco: Never  Vaping Use   Vaping status: Never Used  Substance Use Topics   Alcohol use: No   Drug use: No     Colonoscopy: Never  PAP: Status post hysterectomy  Bone density: Remote   Allergies  Allergen Reactions   Codeine  Hives    Hycodan syrup   Fluorescein  Nausea And Vomiting    ? IV dye for retina specialist   Lipitor [Atorvastatin  Calcium ]     Leg cramp   Oxycodone  Nausea And Vomiting    Patient vomited for 3 days after taking   Sitagliptin Phosphate Nausea And Vomiting   Sulfa Drugs Cross Reactors Nausea And Vomiting    Current Outpatient Medications  Medication Sig Dispense Refill   acetaminophen  (TYLENOL ) 500 MG tablet Take 1,000 mg by mouth every 4 (four) hours as needed for moderate pain or fever.     aspirin  EC 81 MG tablet Take 81 mg by mouth daily at 6 PM. 1700     Biotin 1 MG CAPS Take by mouth.     brimonidine  (ALPHAGAN ) 0.2 % ophthalmic solution INSTILL 1 DROP INTO EACH EYE THREE TIMES DAILY     cholecalciferol (VITAMIN D) 1000 units tablet Take 1,000 Units by mouth daily.     dorzolamide-timolol  (COSOPT) 22.3-6.8 MG/ML ophthalmic solution Place 1 drop into both eyes 2 (two) times daily.     gabapentin  (NEURONTIN ) 100 MG capsule Take 200 mg at bedtime, can increase to 300 mg at bedtime if tolerated 90 capsule 3   ketorolac (ACULAR) 0.5 % ophthalmic solution Place 1 drop into the right eye 2 (two) times daily.     letrozole  (FEMARA ) 2.5 MG tablet Take 1 tablet by mouth once daily 90 tablet 0   lisinopril  (ZESTRIL ) 5 MG tablet Take 1 tablet by mouth once daily 90 tablet 0   lovastatin  (MEVACOR ) 20 MG tablet Take 1 tablet (20 mg total) by mouth at bedtime. 90 tablet 3    meloxicam  (MOBIC ) 15 MG tablet Take 1 tablet (15 mg total) by mouth daily. Take with food 30 tablet 0   Netarsudil-Latanoprost (ROCKLATAN ) 0.02-0.005 % SOLN Apply to eye.  palbociclib  (IBRANCE ) 75 MG tablet Take 1 tablet (75 mg total) by mouth daily. Take for 21 days on, 7 days off, repeat every 28 days. 21 tablet 11   pioglitazone -metformin  (ACTOPLUS MET ) 15-850 MG tablet Take 1 tablet by mouth daily. 90 tablet 3   prednisoLONE  acetate (PRED FORTE ) 1 % ophthalmic suspension SMARTSIG:1 In Eye(s) 6 Times Daily     No current facility-administered medications for this visit.     OBJECTIVE: white woman in no acute distress  Vitals:   01/02/24 1138  BP: (!) 175/66  Pulse: 64  Resp: 18  Temp: 98.4 F (36.9 C)  SpO2: 100%    Wt Readings from Last 3 Encounters:  01/02/24 232 lb 8 oz (105.5 kg)  09/19/23 229 lb 8 oz (104.1 kg)  08/25/23 230 lb (104.3 kg)   Body mass index is 39.91 kg/m.    ECOG FS:1 - Symptomatic but completely ambulatory  Physical Exam Constitutional:      Appearance: Normal appearance.  Cardiovascular:     Rate and Rhythm: Normal rate and regular rhythm.     Pulses: Normal pulses.     Heart sounds: Normal heart sounds.  Pulmonary:     Effort: Pulmonary effort is normal.     Breath sounds: Normal breath sounds.  Musculoskeletal:        General: No swelling. Normal range of motion.     Cervical back: Normal range of motion and neck supple. No rigidity.  Lymphadenopathy:     Cervical: No cervical adenopathy.  Skin:    General: Skin is warm and dry.  Neurological:     General: No focal deficit present.     Mental Status: She is alert.  Psychiatric:        Mood and Affect: Mood normal.      LAB RESULTS:  CMP     Component Value Date/Time   NA 136 09/19/2023 1035   NA 140 05/29/2017 1254   K 4.4 09/19/2023 1035   K 4.4 05/29/2017 1254   CL 104 09/19/2023 1035   CO2 26 09/19/2023 1035   CO2 25 05/29/2017 1254   GLUCOSE 354 (H) 09/19/2023  1035   GLUCOSE 154 (H) 05/29/2017 1254   BUN 15 09/19/2023 1035   BUN 11.5 05/29/2017 1254   CREATININE 0.80 09/19/2023 1035   CREATININE 0.8 05/29/2017 1254   CALCIUM  9.2 09/19/2023 1035   CALCIUM  9.3 05/29/2017 1254   PROT 7.0 09/19/2023 1035   PROT 7.0 05/29/2017 1254   ALBUMIN  4.3 09/19/2023 1035   ALBUMIN  4.1 05/29/2017 1254   AST 13 (L) 09/19/2023 1035   AST 13 05/29/2017 1254   ALT 16 09/19/2023 1035   ALT 14 05/29/2017 1254   ALKPHOS 70 09/19/2023 1035   ALKPHOS 66 05/29/2017 1254   BILITOT 0.5 09/19/2023 1035   BILITOT 0.50 05/29/2017 1254   GFRNONAA >60 09/19/2023 1035   GFRAA >60 03/10/2020 1138    INo results found for: SPEP, UPEP  Lab Results  Component Value Date   WBC 2.6 (L) 01/02/2024   NEUTROABS 1.3 (L) 01/02/2024   HGB 12.7 01/02/2024   HCT 37.8 01/02/2024   MCV 97.2 01/02/2024   PLT 142 (L) 01/02/2024      Chemistry      Component Value Date/Time   NA 136 09/19/2023 1035   NA 140 05/29/2017 1254   K 4.4 09/19/2023 1035   K 4.4 05/29/2017 1254   CL 104 09/19/2023 1035   CO2 26 09/19/2023 1035  CO2 25 05/29/2017 1254   BUN 15 09/19/2023 1035   BUN 11.5 05/29/2017 1254   CREATININE 0.80 09/19/2023 1035   CREATININE 0.8 05/29/2017 1254      Component Value Date/Time   CALCIUM  9.2 09/19/2023 1035   CALCIUM  9.3 05/29/2017 1254   ALKPHOS 70 09/19/2023 1035   ALKPHOS 66 05/29/2017 1254   AST 13 (L) 09/19/2023 1035   AST 13 05/29/2017 1254   ALT 16 09/19/2023 1035   ALT 14 05/29/2017 1254   BILITOT 0.5 09/19/2023 1035   BILITOT 0.50 05/29/2017 1254       No results found for: LABCA2  No components found for: LABCA125  No results for input(s): INR in the last 168 hours.  Urinalysis    Component Value Date/Time   COLORURINE YELLOW 11/25/2022 1005   APPEARANCEUR CLEAR 11/25/2022 1005   LABSPEC 1.025 11/25/2022 1005   PHURINE 6.0 11/25/2022 1005   GLUCOSEU NEGATIVE 11/25/2022 1005   HGBUR NEGATIVE 11/25/2022 1005    BILIRUBINUR NEGATIVE 11/25/2022 1005   KETONESUR NEGATIVE 11/25/2022 1005   PROTEINUR 30 (A) 08/18/2017 1322   UROBILINOGEN 0.2 11/25/2022 1005   NITRITE POSITIVE (A) 11/25/2022 1005   LEUKOCYTESUR MODERATE (A) 11/25/2022 1005    STUDIES: No results found.    ELIGIBLE FOR AVAILABLE RESEARCH PROTOCOL: no  ASSESSMENT: 78 y.o. [place] woman with a remote history of early stage endometrial cancer, subsequently status post right breast upper outer quadrant biopsy 01/22/2016 for a clinically multifocal T2 N0, stage 2A invasive ductal carcinoma, grade 1, estrogen and progesterone receptor positive, HER-2 negative, with an MIB-1 between 10 and 15%.  (1) right axillary lymph node biopsy 03/02/2016 positive  (2) genetics testing 01/13/2016 through the Custom gene panel offered by [facility] found no deleterious mutations in  ATM, BARD1, BRCA1, BRCA2, BRIP1, CDH1, CHEK2, EPCAM, FANCC, MLH1, MSH2, MSH6, MUTYH, NBN, PALB2, PMS2, POLD1, PTEN, RAD51C, RAD51D, TP53, and XRCC2  METASTATIC DISEASE: OCT 2017 (3) CT scans of the chest abdomen and pelvis obtained 03/10/2016 are consistent with bilateral lung metastases and mediastinal and hilar nodal involvement, but no liver or bone spread  (a) bronchoscopic lymph node biopsy 2 (station 7, 13R) 03/28/2016 confirms metastatic adenocarcinoma, estrogen receptor positive, HER-2 not amplified  (b) baseline CA-27-29 on 04/18/2016 was 137.5.  (4) letrozole  started 03/15/2016, palbociclib  added 03/29/2016 at 125 mg/day, 21/7  (a) dose decreased to 100 mg per day, 21/7, beginning with February cycle  (b) palbociclib  held 01/31/2017, with increasing symptoms  (c) palbociclib  resumed October 2018 at 75 mg daily  (d) palbociclib  dose reduced to 75 mg every other day February through April 2019  (e) palbociclib  dose resumed at 75 mg daily as of 10/17/2017  (f) palbociclib  dose decreased to 75 mg every other day beginning 07/31/2019  (g) palbociclib  resumed at 75  mg daily, 21 days on 7 off, as of 08/25/2019  (5) status post double right lumpectomies and right axillary lymph node sampling 01/13/2017 for 2 separate invasive ductal carcinoma lesions, pT1a and pT1b, N1a, with negative margins, both lesions being estrogen and progesterone receptor positive and HER-2 negative  (6) adjuvant radiation completed 07/05/2017 1. 50.4 Gy in 28 fractions to the right breast and supraclavicular region using whole-breast tangent fields. 2. Boost to the seroma delivered an additional 10 Gy in 5 fractions. The total dose was 60.4 Gy.  (7) restaging studies:  (a) CT scan of the chest and bone scan 06/23/2017 showed stable scattered very small lung nodules, no bone lesions  (b)  CT of the chest 10/17/2017 showed no new or progressive metastatic disease in the chest. The small left lower lobe pulmonary nodule is stable  (c) PET scan on 03/02/2018: shows no findings for residual or recurrent right breast cancer  (d) chest CT scan stable, questionable right renal cyst noted  (e) chest CT 09/18/2020 shows no measurable disease  (f) to the CT scan 04/29/2021 shows no evidence of active disease; a 2 cm thyroid  nodule was noted   (8) right upper pole renal lesion noted to be enlarging on CT scan 06/22/2017  (a) no uptake on PET scan obtained 03/02/2018  #9 Most recent imaging with no new suspicious mass or lymphadenopathy identified in the chest abdomen or pelvis.  Stable chronic pleural and parenchymal scarring densities in the right lung apex.  Stable chronic moderate to severe biliary ductal dilatation.  Stable chronic 11 mm left adrenal gland nodule.  No dedicated follow-up required   PLAN:  She continues on Ibrance  75 mg once daily 3 weeks on 1 week off and letrozole  daily.   Assessment & Plan  Leukopenia due to Ibrance  Chronic leukopenia secondary to Ibrance  with stable counts, not concerning. - Continue current Ibrance  regimen.  Bilateral breast pain Bilateral  breast pain, more pronounced on the right. Recent mammogram unremarkable. Possible caffeine contribution. - Advise reducing tea consumption to assess impact on breast tenderness. - Schedule CT systemic scans in September.  Fatigue Fatigue improved, likely post-viral recovery. Increased physical activity tolerance.  Urinary incontinence Intermittent urinary incontinence managed with absorbent undergarments. Aware of Kegel exercises. - Encourage regular Kegel exercises to improve pelvic floor strength.  Total time spent: 30 min  Thank you for consulting us  in the care of this patient.  Please not hesitate contact us  with any additional questions or concerns.  *Total Encounter Time as defined by the Centers for Medicare and Medicaid Services includes, in addition to the face-to-face time of a patient visit (documented in the note above) non-face-to-face time: obtaining and reviewing outside history, ordering and reviewing medications, tests or procedures, care coordination (communications with other health care professionals or caregivers) and documentation in the medical record.  Amber Stalls MD

## 2024-01-04 LAB — CANCER ANTIGEN 27.29: CA 27.29: 50.7 U/mL — ABNORMAL HIGH (ref 0.0–38.6)

## 2024-01-09 ENCOUNTER — Other Ambulatory Visit (HOSPITAL_COMMUNITY): Payer: Self-pay

## 2024-01-09 NOTE — Progress Notes (Signed)
 Specialty Pharmacy Ongoing Clinical Assessment Note  [name] is a 78 y.o. female who is being followed by the specialty pharmacy service for RxSp Oncology   Patient's specialty medication(s) reviewed today: Palbociclib  (IBRANCE )   Missed doses in the last 4 weeks: 0   Patient/Caregiver did not have any additional questions or concerns.   Therapeutic benefit summary: Patient is achieving benefit   Adverse events/side effects summary: No adverse events/side effects   Patient's therapy is appropriate to: Continue    Goals Addressed             This Visit's Progress    Maintain optimal adherence to therapy   On track    Patient is on track. Patient will maintain adherence      Stabilization of disease   On track    Patient is on track. Patient will maintain adherence.  Per 09/19/23 office visit note, the most recent CT scan continued to show stable disease.         Follow up: 6 months  Silvano LOISE Dolly Specialty Pharmacist

## 2024-01-10 ENCOUNTER — Telehealth: Payer: Self-pay | Admitting: *Deleted

## 2024-01-10 DIAGNOSIS — K219 Gastro-esophageal reflux disease without esophagitis: Secondary | ICD-10-CM

## 2024-01-10 DIAGNOSIS — K449 Diaphragmatic hernia without obstruction or gangrene: Secondary | ICD-10-CM

## 2024-01-10 DIAGNOSIS — R1013 Epigastric pain: Secondary | ICD-10-CM

## 2024-01-10 NOTE — Telephone Encounter (Signed)
 This RN spoke with pt per her call stating concerns with several issues-  She noted increasing CA 27.29 which was not available at visit.  Informed pt noted mild increase over the past few months - as well as medically the CA27.29 is not an absolute reading of cancer growth as the number can be affected by other influences.  Dr Loretha is monitoring and noted elevation - with goal to restage as scheduled - offered to recheck numbers in one month.  Taran then stated concern for what she feels is like a sharp pain that occurs in my breast now occurring bilaterally  She states above seems to occur when she is sitting.  She states pain is under my breast and goes across  Per review of CT's noted pt has hiatal hernia - discussed with pt who then stated she has recently had swallowing issues especially with meat  I have to cut it up really small to be able to swallow it well  Per review with MD of above discussion and symptoms - need for GI consult for evaluation of possible hernia spasms occurring.  Discussed with pt who is very agreeable to plan - she declines changing date of restaging scans - or checking tumor marker before September at this time.  Referral placed to GI with confirmation of receipt.

## 2024-01-12 ENCOUNTER — Encounter: Payer: Self-pay | Admitting: Gastroenterology

## 2024-01-22 ENCOUNTER — Other Ambulatory Visit: Payer: Self-pay | Admitting: Internal Medicine

## 2024-01-23 ENCOUNTER — Other Ambulatory Visit (HOSPITAL_COMMUNITY): Payer: Self-pay

## 2024-01-23 ENCOUNTER — Other Ambulatory Visit: Payer: Self-pay

## 2024-01-23 NOTE — Progress Notes (Signed)
 Specialty Pharmacy Refill Coordination Note  [name] is a 78 y.o. female contacted today regarding refills of specialty medication(s) Palbociclib  (IBRANCE )   Patient requested [name] at [facility] at [place] date: 01/29/24   Medication will be filled on 01/26/24.

## 2024-01-25 ENCOUNTER — Other Ambulatory Visit: Payer: Self-pay

## 2024-01-25 DIAGNOSIS — H04123 Dry eye syndrome of bilateral lacrimal glands: Secondary | ICD-10-CM | POA: Diagnosis not present

## 2024-01-25 DIAGNOSIS — H353132 Nonexudative age-related macular degeneration, bilateral, intermediate dry stage: Secondary | ICD-10-CM | POA: Diagnosis not present

## 2024-01-25 DIAGNOSIS — H43813 Vitreous degeneration, bilateral: Secondary | ICD-10-CM | POA: Diagnosis not present

## 2024-01-25 DIAGNOSIS — H20013 Primary iridocyclitis, bilateral: Secondary | ICD-10-CM | POA: Diagnosis not present

## 2024-01-25 DIAGNOSIS — H35353 Cystoid macular degeneration, bilateral: Secondary | ICD-10-CM | POA: Diagnosis not present

## 2024-01-31 ENCOUNTER — Ambulatory Visit (INDEPENDENT_AMBULATORY_CARE_PROVIDER_SITE_OTHER): Admitting: Internal Medicine

## 2024-01-31 ENCOUNTER — Encounter: Payer: Self-pay | Admitting: Internal Medicine

## 2024-01-31 VITALS — BP 132/80 | HR 90 | Ht 64.0 in | Wt 232.0 lb

## 2024-01-31 DIAGNOSIS — K219 Gastro-esophageal reflux disease without esophagitis: Secondary | ICD-10-CM

## 2024-01-31 DIAGNOSIS — R634 Abnormal weight loss: Secondary | ICD-10-CM | POA: Diagnosis not present

## 2024-01-31 DIAGNOSIS — Z9049 Acquired absence of other specified parts of digestive tract: Secondary | ICD-10-CM

## 2024-01-31 DIAGNOSIS — Z8719 Personal history of other diseases of the digestive system: Secondary | ICD-10-CM | POA: Diagnosis not present

## 2024-01-31 DIAGNOSIS — Z853 Personal history of malignant neoplasm of breast: Secondary | ICD-10-CM

## 2024-01-31 DIAGNOSIS — R1310 Dysphagia, unspecified: Secondary | ICD-10-CM | POA: Diagnosis not present

## 2024-01-31 DIAGNOSIS — Z923 Personal history of irradiation: Secondary | ICD-10-CM | POA: Diagnosis not present

## 2024-01-31 NOTE — Progress Notes (Signed)
 HISTORY OF PRESENT ILLNESS:  [name] is a 78 y.o. female, with multiple medical problems as listed below including history of metastatic breast cancer for which she completed chemoradiation therapy approximately 8 years ago.  She is referred now by her oncologist regarding problems with vomiting.  She is accompanied by her husband.  Patient tells me that since January she has had some problems with worsening intermittent solid food dysphagia associated with occasional transient food impactions.  Subsequent consumption of beverages leads to immediate regurgitation of fluids.  She has trouble with items such as steak and rice.  She is cutting her chewing her food exceptionally well.  The problem seems to have progressed in recent months.  Tells me that she has lost 20 pounds since January, but has been weight stable in recent weeks.  She does have a remote history of reflux disease, but nothing recent.  She is not on PPI.  No prior GI history or GI evaluations.  CT scan March 2025 without metastatic disease.  Blood work from September 02, 2023 shows unremarkable comprehensive metabolic panel except for elevated glucose.  Normal CBC with hemoglobin 12.7.  Last hemoglobin A1c March 2025 was 8.6  REVIEW OF SYSTEMS:  All non-GI ROS negative. Past Medical History:  Diagnosis Date   Breast cancer (HCC)    Cancer (HCC) 02/2016   right breast   DIABETES MELLITUS, TYPE II 01/04/2007   only takes actoplus daily   Dizziness and giddiness 02/29/2008   DVT, HX OF    at age 87 in right buttocks   Dyspnea    due to lung cancer   Family history of breast cancer    GERD 01/04/2007   pt reports resolved    GLAUCOMA 07/30/2008   both eyes   History of blood transfusion    no abnormal  reaction   History of uterine cancer 2000   hysterectomy done   HYPERLIPIDEMIA 01/04/2007   taking Pravastatin  daily   HYPERTENSION 01/04/2007   takes Lisinopril  daily   Joint pain    Joint swelling    Leg cramps     LEG PAIN, LEFT 07/06/2007   NUMBNESS 07/30/2008   in fingers;pt states from Diamox    OSTEOARTHRITIS, HIP 09/25/2009   OTITIS MEDIA, ACUTE, BILATERAL 02/29/2008   Overweight(278.02) 01/04/2007   Peripheral vascular disease (HCC)    Personal history of radiation therapy 2018   Pneumonia    PONV (postoperative nausea and vomiting)    SLEEP APNEA, OBSTRUCTIVE    doesn't use a cpap;study done about 68yrs ago   TRANSIENT ISCHEMIC ATTACK, HX OF 01/04/2007   Vision loss    left eye    Past Surgical History:  Procedure Laterality Date   ABDOMINAL HYSTERECTOMY  2000   BREAST LUMPECTOMY Right 01/13/2017   x2   BREAST LUMPECTOMY WITH RADIOACTIVE SEED AND SENTINEL LYMPH NODE BIOPSY Right 01/13/2017   Procedure: RIGHT BREAST RADIOACTIVE SEED X'S 2 GUIDED LUMPECTOMY WITH RADIOACTIVE SEED TARGETED AXILLARYLYMPH NODE EXCISION AND RIGHT AXILLARY SENTINEL LYMPH NODE BIOPSY;  Surgeon: Ethyl Lenis, MD;  Location: [facility];  Service: General;  Laterality: Right;  2 SEEDS IN RIGHT BREAST 1 SEED IN RIGHT AXILLARY NODE   CHOLECYSTECTOMY     ENDOBRONCHIAL ULTRASOUND Bilateral 03/28/2016   Procedure: ENDOBRONCHIAL ULTRASOUND;  Surgeon: Lamar GORMAN Chris, MD;  Location: [facility];  Service: Cardiopulmonary;  Laterality: Bilateral;   EYE SURGERY  13   shunt left and lazer eye surgery on right cataract and retenia tear with repair  growth removal  2004   from thumb   KNEE ARTHROSCOPY Right    mulitple eye surgeries     both eyes, cataracts with ioc done both eyes   OOPHORECTOMY     right lumpectomy with axillary node dissection Right 01/2017   TOTAL HIP ARTHROPLASTY  06/24/2011   Procedure: TOTAL HIP ARTHROPLASTY;  Surgeon: Dempsey JINNY Sensor;  Location: [facility];  Service: Orthopedics;  Laterality: Right;   TOTAL HIP ARTHROPLASTY Left 11/12/2012   Dr Sensor   TOTAL HIP ARTHROPLASTY Left 11/12/2012   Procedure: TOTAL HIP ARTHROPLASTY;  Surgeon: Dempsey JINNY Sensor, MD;  Location: [facility];  Service: Orthopedics;  Laterality: Left;   DEPUY PINNACLE    Social History [name]  reports that she has never smoked. She has never used smokeless tobacco. She reports that she does not drink alcohol and does not use drugs.  family history includes Breast cancer (age of onset: 25) in her sister; Breast cancer (age of onset: 42) in her mother; Cancer in her mother; Dementia in her mother; Glaucoma in her maternal grandfather; Heart attack in her maternal aunt; Lung cancer in her maternal grandmother; Stroke in her sister.  Allergies  Allergen Reactions   Codeine  Hives    Hycodan syrup   Fluorescein  Nausea And Vomiting    ? IV dye for retina specialist   Lipitor [Atorvastatin  Calcium ]     Leg cramp   Oxycodone  Nausea And Vomiting    Patient vomited for 3 days after taking   Sitagliptin Phosphate Nausea And Vomiting   Sulfa Drugs Cross Reactors Nausea And Vomiting       PHYSICAL EXAMINATION: Vital signs: BP 132/80   Pulse 90   Ht 5' 4 (1.626 m)   Wt 232 lb (105.2 kg)   BMI 39.82 kg/m   Constitutional: generally well-appearing, no acute distress Psychiatric: alert and oriented x3, cooperative, anxious Eyes: extraocular movements intact, anicteric, conjunctiva pink Mouth: oral pharynx moist, no lesions Neck: supple no lymphadenopathy Cardiovascular: heart regular rate and rhythm, no murmur Lungs: clear to auscultation bilaterally Abdomen: soft, obese, nontender, nondistended, no obvious ascites, no peritoneal signs, normal bowel sounds, no organomegaly Rectal: Omitted Extremities: no clubbing or cyanosis.  Lower extremity edema on the right Skin: no relevant lesions on visible extremities Neuro: No focal deficits.  Cranial nerves intact  ASSESSMENT:  1.  Worsening intermittent solid food dysphagia with weight loss.  Rule out neoplasia.  Rule out peptic stricture.  Rule out radiation-induced stricture 2.  Remote history of breast cancer.  Status post chemoradiation therapy.  CT scan of the abdomen pelvis July  2024 in March 2025 are without evidence of metastatic disease 3.  Status post cholecystectomy 4.  Remote history of GERD.  No active symptoms at present.  Not on PPI 5.  Multiple medical problems including diabetes with hemoglobin A1c 8.6   PLAN:  1.  Schedule upper endoscopy with possible esophageal dilation.The nature of the procedure, as well as the risks, benefits, and alternatives were carefully and thoroughly reviewed with the patient. Ample time for discussion and questions allowed. The patient understood, was satisfied, and agreed to proceed. 2.  May need PPI 3.  Further recommendations after the above A total time of 60 minutes was spent preparing to see the patient, reviewing records, imaging studies, and laboratories.  Obtaining comprehensive history, performing medically appropriate physical exam, counseling and educating the patient and her husband regarding the above listed issues, ordering therapeutic endoscopic procedure, and documenting clinical information  in the health record

## 2024-01-31 NOTE — Patient Instructions (Signed)
 You have been scheduled for an endoscopy. Please follow written instructions given to you at your visit today.  If you use inhalers (even only as needed), please bring them with you on the day of your procedure.  If you take any of the following medications, they will need to be adjusted prior to your procedure:   DO NOT TAKE 7 DAYS PRIOR TO TEST- Trulicity (dulaglutide) Ozempic, Wegovy (semaglutide) Mounjaro (tirzepatide) Bydureon Bcise (exanatide extended release)  DO NOT TAKE 1 DAY PRIOR TO YOUR TEST Rybelsus (semaglutide) Adlyxin (lixisenatide) Victoza (liraglutide) Byetta (exanatide) ___________________________________________________________________________  _______________________________________________________  If your blood pressure at your visit was 140/90 or greater, please contact your primary care physician to follow up on this.  _______________________________________________________  If you are age 86 or older, your body mass index should be between 23-30. Your Body mass index is 39.82 kg/m. If this is out of the aforementioned range listed, please consider follow up with your Primary Care Provider.  If you are age 56 or younger, your body mass index should be between 19-25. Your Body mass index is 39.82 kg/m. If this is out of the aformentioned range listed, please consider follow up with your Primary Care Provider.   ________________________________________________________  The [facility] providers would like to encourage you to use MYCHART to communicate with providers for non-urgent requests or questions.  Due to long hold times on the telephone, sending your provider a message by [facility] may be a faster and more efficient way to get a response.  Please allow 48 business hours for a response.  Please remember that this is for non-urgent requests.  _______________________________________________________  [facility] is using a team-based approach to  care.  Your team is made up of your doctor and two to three APPS. Our APPS (Nurse Practitioners and Physician Assistants) work with your physician to ensure care continuity for you. They are fully qualified to address your health concerns and develop a treatment plan. They communicate directly with your gastroenterologist to care for you. Seeing the Advanced Practice Practitioners on your physician's team can help you by facilitating care more promptly, often allowing for earlier appointments, access to diagnostic testing, procedures, and other specialty referrals.

## 2024-02-01 DIAGNOSIS — H20013 Primary iridocyclitis, bilateral: Secondary | ICD-10-CM | POA: Diagnosis not present

## 2024-02-02 ENCOUNTER — Encounter: Payer: Self-pay | Admitting: Internal Medicine

## 2024-02-02 ENCOUNTER — Ambulatory Visit (AMBULATORY_SURGERY_CENTER): Admitting: Internal Medicine

## 2024-02-02 VITALS — BP 182/64 | HR 86 | Temp 97.2°F | Resp 16 | Ht 64.0 in | Wt 232.0 lb

## 2024-02-02 DIAGNOSIS — K2100 Gastro-esophageal reflux disease with esophagitis, without bleeding: Secondary | ICD-10-CM | POA: Diagnosis not present

## 2024-02-02 DIAGNOSIS — K209 Esophagitis, unspecified without bleeding: Secondary | ICD-10-CM

## 2024-02-02 DIAGNOSIS — I10 Essential (primary) hypertension: Secondary | ICD-10-CM | POA: Diagnosis not present

## 2024-02-02 DIAGNOSIS — R1310 Dysphagia, unspecified: Secondary | ICD-10-CM | POA: Diagnosis not present

## 2024-02-02 DIAGNOSIS — K449 Diaphragmatic hernia without obstruction or gangrene: Secondary | ICD-10-CM

## 2024-02-02 DIAGNOSIS — K2970 Gastritis, unspecified, without bleeding: Secondary | ICD-10-CM

## 2024-02-02 DIAGNOSIS — G4733 Obstructive sleep apnea (adult) (pediatric): Secondary | ICD-10-CM | POA: Diagnosis not present

## 2024-02-02 DIAGNOSIS — K222 Esophageal obstruction: Secondary | ICD-10-CM | POA: Diagnosis not present

## 2024-02-02 DIAGNOSIS — K2980 Duodenitis without bleeding: Secondary | ICD-10-CM

## 2024-02-02 DIAGNOSIS — K2950 Unspecified chronic gastritis without bleeding: Secondary | ICD-10-CM

## 2024-02-02 DIAGNOSIS — K219 Gastro-esophageal reflux disease without esophagitis: Secondary | ICD-10-CM

## 2024-02-02 DIAGNOSIS — K2951 Unspecified chronic gastritis with bleeding: Secondary | ICD-10-CM | POA: Diagnosis not present

## 2024-02-02 DIAGNOSIS — E119 Type 2 diabetes mellitus without complications: Secondary | ICD-10-CM | POA: Diagnosis not present

## 2024-02-02 MED ORDER — SODIUM CHLORIDE 0.9 % IV SOLN: 500.0000 mL | Freq: Once | INTRAVENOUS | Status: DC

## 2024-02-02 MED ORDER — PANTOPRAZOLE SODIUM 40 MG PO TBEC: 40.0000 mg | DELAYED_RELEASE_TABLET | Freq: Every day | ORAL | 11 refills | Status: AC

## 2024-02-02 NOTE — Progress Notes (Signed)
 Report to PACU, RN, vss, BBS= Clear.

## 2024-02-02 NOTE — Progress Notes (Signed)
 Expand All Collapse All HISTORY OF PRESENT ILLNESS:   [name] is a 78 y.o. female, with multiple medical problems as listed below including history of metastatic breast cancer for which she completed chemoradiation therapy approximately 8 years ago.  She is referred now by her oncologist regarding problems with vomiting.  She is accompanied by her husband.   Patient tells me that since January she has had some problems with worsening intermittent solid food dysphagia associated with occasional transient food impactions.  Subsequent consumption of beverages leads to immediate regurgitation of fluids.  She has trouble with items such as steak and rice.  She is cutting her chewing her food exceptionally well.  The problem seems to have progressed in recent months.  Tells me that she has lost 20 pounds since January, but has been weight stable in recent weeks.  She does have a remote history of reflux disease, but nothing recent.  She is not on PPI.  No prior GI history or GI evaluations.   CT scan March 2025 without metastatic disease.   Blood work from September 02, 2023 shows unremarkable comprehensive metabolic panel except for elevated glucose.  Normal CBC with hemoglobin 12.7.  Last hemoglobin A1c March 2025 was 8.6   REVIEW OF SYSTEMS:   All non-GI ROS negative.     Past Medical History:  Diagnosis Date   Breast cancer (HCC)     Cancer (HCC) 02/2016    right breast   DIABETES MELLITUS, TYPE II 01/04/2007    only takes actoplus daily   Dizziness and giddiness 02/29/2008   DVT, HX OF      at age 42 in right buttocks   Dyspnea      due to lung cancer   Family history of breast cancer     GERD 01/04/2007    pt reports resolved    GLAUCOMA 07/30/2008    both eyes   History of blood transfusion      no abnormal  reaction   History of uterine cancer 2000    hysterectomy done   HYPERLIPIDEMIA 01/04/2007    taking Pravastatin  daily   HYPERTENSION 01/04/2007    takes Lisinopril  daily    Joint pain     Joint swelling     Leg cramps     LEG PAIN, LEFT 07/06/2007   NUMBNESS 07/30/2008    in fingers;pt states from Diamox    OSTEOARTHRITIS, HIP 09/25/2009   OTITIS MEDIA, ACUTE, BILATERAL 02/29/2008   Overweight(278.02) 01/04/2007   Peripheral vascular disease (HCC)     Personal history of radiation therapy 2018   Pneumonia     PONV (postoperative nausea and vomiting)     SLEEP APNEA, OBSTRUCTIVE      doesn't use a cpap;study done about 62yrs ago   TRANSIENT ISCHEMIC ATTACK, HX OF 01/04/2007   Vision loss      left eye               Past Surgical History:  Procedure Laterality Date   ABDOMINAL HYSTERECTOMY   2000   BREAST LUMPECTOMY Right 01/13/2017    x2   BREAST LUMPECTOMY WITH RADIOACTIVE SEED AND SENTINEL LYMPH NODE BIOPSY Right 01/13/2017    Procedure: RIGHT BREAST RADIOACTIVE SEED X'S 2 GUIDED LUMPECTOMY WITH RADIOACTIVE SEED TARGETED AXILLARYLYMPH NODE EXCISION AND RIGHT AXILLARY SENTINEL LYMPH NODE BIOPSY;  Surgeon: Ethyl Lenis, MD;  Location: [facility];  Service: General;  Laterality: Right;  2 SEEDS IN RIGHT BREAST 1 SEED IN RIGHT AXILLARY NODE  CHOLECYSTECTOMY       ENDOBRONCHIAL ULTRASOUND Bilateral 03/28/2016    Procedure: ENDOBRONCHIAL ULTRASOUND;  Surgeon: Lamar GORMAN Chris, MD;  Location: [facility];  Service: Cardiopulmonary;  Laterality: Bilateral;   EYE SURGERY   13    shunt left and lazer eye surgery on right cataract and retenia tear with repair   growth removal   2004    from thumb   KNEE ARTHROSCOPY Right     mulitple eye surgeries        both eyes, cataracts with ioc done both eyes   OOPHORECTOMY       right lumpectomy with axillary node dissection Right 01/2017   TOTAL HIP ARTHROPLASTY   06/24/2011    Procedure: TOTAL HIP ARTHROPLASTY;  Surgeon: Dempsey JINNY Sensor;  Location: [facility];  Service: Orthopedics;  Laterality: Right;   TOTAL HIP ARTHROPLASTY Left 11/12/2012    Dr Sensor   TOTAL HIP ARTHROPLASTY Left 11/12/2012    Procedure: TOTAL HIP  ARTHROPLASTY;  Surgeon: Dempsey JINNY Sensor, MD;  Location: [facility];  Service: Orthopedics;  Laterality: Left;  DEPUY PINNACLE          Social History [name]  reports that she has never smoked. She has never used smokeless tobacco. She reports that she does not drink alcohol and does not use drugs.   family history includes Breast cancer (age of onset: 1) in her sister; Breast cancer (age of onset: 46) in her mother; Cancer in her mother; Dementia in her mother; Glaucoma in her maternal grandfather; Heart attack in her maternal aunt; Lung cancer in her maternal grandmother; Stroke in her sister.   Allergies       Allergies  Allergen Reactions   Codeine  Hives      Hycodan syrup   Fluorescein  Nausea And Vomiting      ? IV dye for retina specialist   Lipitor [Atorvastatin  Calcium ]        Leg cramp   Oxycodone  Nausea And Vomiting      Patient vomited for 3 days after taking   Sitagliptin Phosphate Nausea And Vomiting   Sulfa Drugs Cross Reactors Nausea And Vomiting            PHYSICAL EXAMINATION: Vital signs: BP 132/80   Pulse 90   Ht 5' 4 (1.626 m)   Wt 232 lb (105.2 kg)   BMI 39.82 kg/m   Constitutional: generally well-appearing, no acute distress Psychiatric: alert and oriented x3, cooperative, anxious Eyes: extraocular movements intact, anicteric, conjunctiva pink Mouth: oral pharynx moist, no lesions Neck: supple no lymphadenopathy Cardiovascular: heart regular rate and rhythm, no murmur Lungs: clear to auscultation bilaterally Abdomen: soft, obese, nontender, nondistended, no obvious ascites, no peritoneal signs, normal bowel sounds, no organomegaly Rectal: Omitted Extremities: no clubbing or cyanosis.  Lower extremity edema on the right Skin: no relevant lesions on visible extremities Neuro: No focal deficits.  Cranial nerves intact   ASSESSMENT:   1.  Worsening intermittent solid food dysphagia with weight loss.  Rule out neoplasia.  Rule out peptic stricture.   Rule out radiation-induced stricture 2.  Remote history of breast cancer.  Status post chemoradiation therapy.  CT scan of the abdomen pelvis July 2024 in March 2025 are without evidence of metastatic disease 3.  Status post cholecystectomy 4.  Remote history of GERD.  No active symptoms at present.  Not on PPI 5.  Multiple medical problems including diabetes with hemoglobin A1c 8.6     PLAN:   1.  Schedule upper endoscopy with possible esophageal dilation.The nature of the procedure, as well as the risks, benefits, and alternatives were carefully and thoroughly reviewed with the patient. Ample time for discussion and questions allowed. The patient understood, was satisfied, and agreed to proceed. 2.  May need PPI 3.  Further recommendations after the above

## 2024-02-02 NOTE — Op Note (Signed)
 [facility] Patient Name: [name] Procedure Date: 02/02/2024 11:39 AM MRN: [id number] Endoscopist: Norleen SAILOR. Abran , MD, [id number] Age: 78 Referring MD:  Date of Birth: May 14, 1946 Gender: Female Account #: [id number] Procedure:                Upper GI endoscopy with biopsies, balloon dilation                            of the esophagus. 20 mm max Indications:              Dysphagia, Suspected esophageal reflux Medicines:                Monitored Anesthesia Care Procedure:                Pre-Anesthesia Assessment:                           - Prior to the procedure, a History and Physical                            was performed, and patient medications and                            allergies were reviewed. The patient's tolerance of                            previous anesthesia was also reviewed. The risks                            and benefits of the procedure and the sedation                            options and risks were discussed with the patient.                            All questions were answered, and informed consent                            was obtained. Prior Anticoagulants: The patient has                            taken no anticoagulant or antiplatelet agents. ASA                            Grade Assessment: II - A patient with mild systemic                            disease. After reviewing the risks and benefits,                            the patient was deemed in satisfactory condition to                            undergo the procedure.  After obtaining informed consent, the endoscope was                            passed under direct vision. Throughout the                            procedure, the patient's blood pressure, pulse, and                            oxygen  saturations were monitored continuously. The                            GIF [id number] #[id number] was introduced through the                            mouth, and  advanced to the second part of duodenum.                            The upper GI endoscopy was accomplished without                            difficulty. The patient tolerated the procedure                            well. Scope In: Scope Out: Findings:                 The exam of the esophagus was unremarkable in the                            proximal and mid portions. At the gastroesophageal                            junction was reflux esophagitis as manifested by                            friability and edema..                           One benign-appearing, intrinsic moderate stenosis                            was found 35 cm from the incisors. This stenosis                            measured 1.5 cm (inner diameter). A TTS dilator was                            passed through the scope. Dilation with an 18-19-20                            mm balloon dilator was performed to 20 mm.  The stomach revealed a hiatal hernia. As well, mild                            antral gastritis.                           The examined duodenum revealed mild duodenitis as                            manifested by bulbar erythema. The postbulbar                            duodenum was normal.                           The cardia and gastric fundus were normal on                            retroflexion. Complications:            No immediate complications. Estimated Blood Loss:     Estimated blood loss: none. Impression:               1. GERD complicated by esophagitis and peptic                            stricture status post dilation                           2. Hiatal hernia                           3. Mild gastroduodenitis. Status post biopsies. Recommendation:           1. Patient has a contact number available for                            emergencies. The signs and symptoms of potential                            delayed complications were discussed with the                             patient. Return to normal activities tomorrow.                            Written discharge instructions were provided to the                            patient.                           2. Post dilation diet.                           3. Reflux precautions with attention to weight loss  4. Follow-up biopsies.                           5. Prescribe pantoprazole  40 mg daily; #30; 11                            refills. TAKE 1 EVERY DAY IN THE MORNING 30 minutes                            BEFORE BREAKFAST                           6. Office follow-up with Dr. Abran in 8 weeks. Norleen SAILOR. Abran, MD 02/02/2024 12:08:01 PM This report has been signed electronically.

## 2024-02-02 NOTE — Progress Notes (Signed)
 Called to room to assist during endoscopic procedure.  Patient ID and intended procedure confirmed with present staff. Received instructions for my participation in the procedure from the performing physician.

## 2024-02-02 NOTE — Progress Notes (Signed)
 VD by DT

## 2024-02-02 NOTE — Patient Instructions (Signed)
 YOU HAD AN ENDOSCOPIC PROCEDURE TODAY AT THE [facility]:   Refer to the procedure report that was given to you for any specific questions about what was found during the examination.  If the procedure report does not answer your questions, please call your gastroenterologist to clarify.  If you requested that your care partner not be given the details of your procedure findings, then the procedure report has been included in a sealed envelope for you to review at your convenience later.  YOU SHOULD EXPECT: Some feelings of bloating in the abdomen. Passage of more gas than usual.  Walking can help get rid of the air that was put into your GI tract during the procedure and reduce the bloating. If you had a lower endoscopy (such as a colonoscopy or flexible sigmoidoscopy) you may notice spotting of blood in your stool or on the toilet paper. If you underwent a bowel prep for your procedure, you may not have a normal bowel movement for a few days.  Please Note:  You might notice some irritation and congestion in your nose or some drainage.  This is from the oxygen used during your procedure.  There is no need for concern and it should clear up in a day or so.  SYMPTOMS TO REPORT IMMEDIATELY:  Following lower endoscopy (colonoscopy or flexible sigmoidoscopy):  Excessive amounts of blood in the stool  Significant tenderness or worsening of abdominal pains  Swelling of the abdomen that is new, acute  Fever of 100F or higher  Following upper endoscopy (EGD)  Vomiting of blood or coffee ground material  New chest pain or pain under the shoulder blades  Painful or persistently difficult swallowing  New shortness of breath  Fever of 100F or higher  Black, tarry-looking stools  For urgent or emergent issues, a gastroenterologist can be reached at any hour by calling [phone]. Do not use MyChart messaging for urgent concerns.    DIET:  We do recommend a small meal at first, but  then you may proceed to your regular diet.  Drink plenty of fluids but you should avoid alcoholic beverages for 24 hours.  ACTIVITY:  You should plan to take it easy for the rest of today and you should NOT DRIVE or use heavy machinery until tomorrow (because of the sedation medicines used during the test).    FOLLOW UP: Our staff will call the number listed on your records the next business day following your procedure.  We will call around 7:15- 8:00 am to check on you and address any questions or concerns that you may have regarding the information given to you following your procedure. If we do not reach you, we will leave a message.     If any biopsies were taken you will be contacted by phone or by letter within the next 1-3 weeks.  Please call us  at [phone] if you have not heard about the biopsies in 3 weeks.    SIGNATURES/CONFIDENTIALITY: You and/or your care partner have signed paperwork which will be entered into your electronic medical record.  These signatures attest to the fact that that the information above on your After Visit Summary has been reviewed and is understood.  Full responsibility of the confidentiality of this discharge information lies with you and/or your care-partner.

## 2024-02-05 ENCOUNTER — Telehealth: Payer: Self-pay

## 2024-02-05 ENCOUNTER — Telehealth: Payer: Self-pay | Admitting: *Deleted

## 2024-02-05 NOTE — Telephone Encounter (Signed)
 No answer after post call. Left a message.

## 2024-02-05 NOTE — Telephone Encounter (Signed)
 Returned pt's call. She reports that she read her instructions after calling and they provided answers to all of her questions. No other questions or concerns at this time.

## 2024-02-05 NOTE — Telephone Encounter (Addendum)
 Patient called and stated that she is feeling fine but has a couple of questions. Patient would like to know if for right now is it normal to be burping after every meal. And should she be drinking milk. Please advise.

## 2024-02-05 NOTE — Telephone Encounter (Signed)
 Pt called to verbalize thanks regarding the care she received and referral to GI. She had a procedure for hiatal hernia and mass that was causing acid reflux and pain throughout her chest. She states she is feeling better than she has in a long time. She will f/u with us  in October as scheduled and knows to call in the interim if needed.

## 2024-02-06 ENCOUNTER — Ambulatory Visit: Payer: Self-pay | Admitting: Internal Medicine

## 2024-02-06 LAB — SURGICAL PATHOLOGY

## 2024-02-07 ENCOUNTER — Ambulatory Visit: Payer: PPO

## 2024-02-19 ENCOUNTER — Encounter (INDEPENDENT_AMBULATORY_CARE_PROVIDER_SITE_OTHER): Payer: Self-pay

## 2024-02-20 ENCOUNTER — Other Ambulatory Visit: Payer: Self-pay

## 2024-02-20 ENCOUNTER — Other Ambulatory Visit: Payer: Self-pay | Admitting: Pharmacy Technician

## 2024-02-20 ENCOUNTER — Telehealth: Payer: Self-pay

## 2024-02-20 ENCOUNTER — Other Ambulatory Visit (HOSPITAL_COMMUNITY): Payer: Self-pay

## 2024-02-20 DIAGNOSIS — C7800 Secondary malignant neoplasm of unspecified lung: Secondary | ICD-10-CM

## 2024-02-20 DIAGNOSIS — C50411 Malignant neoplasm of upper-outer quadrant of right female breast: Secondary | ICD-10-CM

## 2024-02-20 NOTE — Telephone Encounter (Signed)
 Pt called to ask about CT CAP and when it will be placed.   Per MD, order placed for CT CAP for met disease eval. Pt is aware and will call to schedule.

## 2024-02-20 NOTE — Progress Notes (Signed)
 Specialty Pharmacy Refill Coordination Note  [name] is a 78 y.o. female contacted today regarding refills of specialty medication(s) Palbociclib  (IBRANCE )   Patient requested [name] at [facility] at [place] date: 02/23/24   Medication will be filled on 02/23/24.  Pt will be on a 7 day break and resume on 9/17.

## 2024-02-21 DIAGNOSIS — H20013 Primary iridocyclitis, bilateral: Secondary | ICD-10-CM | POA: Diagnosis not present

## 2024-02-23 ENCOUNTER — Other Ambulatory Visit: Payer: Self-pay

## 2024-02-29 ENCOUNTER — Other Ambulatory Visit: Payer: Self-pay | Admitting: Hematology and Oncology

## 2024-03-11 ENCOUNTER — Ambulatory Visit (HOSPITAL_COMMUNITY)
Admission: RE | Admit: 2024-03-11 | Discharge: 2024-03-11 | Disposition: A | Discharge status: Home / Self Care | Source: Ambulatory Visit | Attending: Hematology and Oncology | Admitting: Hematology and Oncology

## 2024-03-11 ENCOUNTER — Ambulatory Visit: Admitting: Gastroenterology

## 2024-03-11 DIAGNOSIS — C7800 Secondary malignant neoplasm of unspecified lung: Secondary | ICD-10-CM | POA: Insufficient documentation

## 2024-03-11 DIAGNOSIS — C50411 Malignant neoplasm of upper-outer quadrant of right female breast: Secondary | ICD-10-CM | POA: Insufficient documentation

## 2024-03-11 DIAGNOSIS — Z170 Estrogen receptor positive status [ER+]: Secondary | ICD-10-CM | POA: Diagnosis not present

## 2024-03-11 DIAGNOSIS — J8410 Pulmonary fibrosis, unspecified: Secondary | ICD-10-CM | POA: Diagnosis not present

## 2024-03-11 DIAGNOSIS — K769 Liver disease, unspecified: Secondary | ICD-10-CM | POA: Diagnosis not present

## 2024-03-13 ENCOUNTER — Ambulatory Visit: Payer: Self-pay | Admitting: Hematology and Oncology

## 2024-03-16 ENCOUNTER — Other Ambulatory Visit: Payer: Self-pay | Admitting: Internal Medicine

## 2024-03-19 ENCOUNTER — Other Ambulatory Visit: Payer: Self-pay

## 2024-03-19 NOTE — Progress Notes (Signed)
 Specialty Pharmacy Refill Coordination Note  [name] is a 78 y.o. female contacted today regarding refills of specialty medication(s) Palbociclib  (IBRANCE )   Patient requested [name] at [facility] at [place] date: 03/21/24   Medication will be filled on 03/19/24.

## 2024-03-20 ENCOUNTER — Telehealth: Payer: Self-pay

## 2024-03-20 NOTE — Telephone Encounter (Signed)
 Spoke with patient and confirmed appointment on 10/9

## 2024-03-21 ENCOUNTER — Other Ambulatory Visit: Payer: Self-pay

## 2024-03-21 ENCOUNTER — Inpatient Hospital Stay

## 2024-03-21 ENCOUNTER — Inpatient Hospital Stay: Attending: Hematology and Oncology

## 2024-03-21 ENCOUNTER — Inpatient Hospital Stay (HOSPITAL_BASED_OUTPATIENT_CLINIC_OR_DEPARTMENT_OTHER): Admitting: Hematology and Oncology

## 2024-03-21 VITALS — BP 160/54 | HR 78 | Temp 97.3°F | Resp 16 | Wt 231.2 lb

## 2024-03-21 DIAGNOSIS — Z801 Family history of malignant neoplasm of trachea, bronchus and lung: Secondary | ICD-10-CM | POA: Insufficient documentation

## 2024-03-21 DIAGNOSIS — C773 Secondary and unspecified malignant neoplasm of axilla and upper limb lymph nodes: Secondary | ICD-10-CM | POA: Diagnosis not present

## 2024-03-21 DIAGNOSIS — Z8542 Personal history of malignant neoplasm of other parts of uterus: Secondary | ICD-10-CM | POA: Insufficient documentation

## 2024-03-21 DIAGNOSIS — Z170 Estrogen receptor positive status [ER+]: Secondary | ICD-10-CM | POA: Insufficient documentation

## 2024-03-21 DIAGNOSIS — C7802 Secondary malignant neoplasm of left lung: Secondary | ICD-10-CM | POA: Diagnosis not present

## 2024-03-21 DIAGNOSIS — C787 Secondary malignant neoplasm of liver and intrahepatic bile duct: Secondary | ICD-10-CM | POA: Insufficient documentation

## 2024-03-21 DIAGNOSIS — Z1721 Progesterone receptor positive status: Secondary | ICD-10-CM | POA: Diagnosis not present

## 2024-03-21 DIAGNOSIS — Z803 Family history of malignant neoplasm of breast: Secondary | ICD-10-CM | POA: Diagnosis not present

## 2024-03-21 DIAGNOSIS — C50411 Malignant neoplasm of upper-outer quadrant of right female breast: Secondary | ICD-10-CM

## 2024-03-21 DIAGNOSIS — Z5111 Encounter for antineoplastic chemotherapy: Secondary | ICD-10-CM | POA: Insufficient documentation

## 2024-03-21 DIAGNOSIS — Z923 Personal history of irradiation: Secondary | ICD-10-CM | POA: Insufficient documentation

## 2024-03-21 DIAGNOSIS — Z1732 Human epidermal growth factor receptor 2 negative status: Secondary | ICD-10-CM | POA: Diagnosis not present

## 2024-03-21 DIAGNOSIS — C7801 Secondary malignant neoplasm of right lung: Secondary | ICD-10-CM | POA: Diagnosis not present

## 2024-03-21 DIAGNOSIS — R110 Nausea: Secondary | ICD-10-CM | POA: Insufficient documentation

## 2024-03-21 DIAGNOSIS — Z79811 Long term (current) use of aromatase inhibitors: Secondary | ICD-10-CM | POA: Insufficient documentation

## 2024-03-21 DIAGNOSIS — C7800 Secondary malignant neoplasm of unspecified lung: Secondary | ICD-10-CM

## 2024-03-21 DIAGNOSIS — D701 Agranulocytosis secondary to cancer chemotherapy: Secondary | ICD-10-CM | POA: Diagnosis not present

## 2024-03-21 LAB — CBC WITH DIFFERENTIAL (CANCER CENTER ONLY)
Abs Immature Granulocytes: 0 K/uL (ref 0.00–0.07)
Basophils Absolute: 0 K/uL (ref 0.0–0.1)
Basophils Relative: 1 %
Eosinophils Absolute: 0 K/uL (ref 0.0–0.5)
Eosinophils Relative: 2 %
HCT: 34.5 % — ABNORMAL LOW (ref 36.0–46.0)
Hemoglobin: 12.2 g/dL (ref 12.0–15.0)
Immature Granulocytes: 0 %
Lymphocytes Relative: 31 %
Lymphs Abs: 0.5 K/uL — ABNORMAL LOW (ref 0.7–4.0)
MCH: 32.8 pg (ref 26.0–34.0)
MCHC: 35.4 g/dL (ref 30.0–36.0)
MCV: 92.7 fL (ref 80.0–100.0)
Monocytes Absolute: 0.2 K/uL (ref 0.1–1.0)
Monocytes Relative: 9 %
Neutro Abs: 1 K/uL — ABNORMAL LOW (ref 1.7–7.7)
Neutrophils Relative %: 57 %
Platelet Count: 179 K/uL (ref 150–400)
RBC: 3.72 MIL/uL — ABNORMAL LOW (ref 3.87–5.11)
RDW: 14.6 % (ref 11.5–15.5)
WBC Count: 1.7 K/uL — ABNORMAL LOW (ref 4.0–10.5)
nRBC: 0 % (ref 0.0–0.2)

## 2024-03-21 LAB — CMP (CANCER CENTER ONLY)
ALT: 12 U/L (ref 0–44)
AST: 11 U/L — ABNORMAL LOW (ref 15–41)
Albumin: 4 g/dL (ref 3.5–5.0)
Alkaline Phosphatase: 52 U/L (ref 38–126)
Anion gap: 6 (ref 5–15)
BUN: 19 mg/dL (ref 8–23)
CO2: 27 mmol/L (ref 22–32)
Calcium: 9.4 mg/dL (ref 8.9–10.3)
Chloride: 106 mmol/L (ref 98–111)
Creatinine: 0.78 mg/dL (ref 0.44–1.00)
GFR, Estimated: >60 mL/min (ref >60)
Glucose, Bld: 236 mg/dL — ABNORMAL HIGH (ref 70–99)
Potassium: 4.1 mmol/L (ref 3.5–5.1)
Sodium: 139 mmol/L (ref 135–145)
Total Bilirubin: 0.4 mg/dL (ref 0.0–1.2)
Total Protein: 6.8 g/dL (ref 6.5–8.1)

## 2024-03-21 NOTE — Progress Notes (Signed)
 Guardant 360 entered per MD. Requisition completed with all supporting documents. Given to [facility] lab.

## 2024-03-21 NOTE — Progress Notes (Unsigned)
 [name] - [facility]  Telephone:([phone]) [phone] Fax:(336) [phone]     ID: [name] DOB: 02-05-1946  MR#: [id number]  RDW#:[id number]  Patient Care Team: Rollene Almarie LABOR, MD as PCP - General (Internal Medicine) Ethyl Lenis, MD as Consulting Physician (General Surgery) Dewey Rush, MD as Consulting Physician (Radiation Oncology) Sarrah Browning, MD as Consulting Physician (Obstetrics and Gynecology) Marleen Redell SAUNDERS, MD as Referring Physician (Specialist) Shelah Lamar RAMAN, MD as Consulting Physician (Pulmonary Disease) Alvaro Ricardo KATHEE Raddle., MD as Consulting Physician (Urology) Nilsa, Dempsey Pac, MD as Referring Physician (Ophthalmology) Lucila Rush LABOR, RPH-CPP (Pharmacist) Austin Olam CROME, MD as Consulting Physician (Ophthalmology) Loretha Ash, MD as Consulting Physician (Hematology and Oncology) Loretha Ash, MD as Consulting Physician (Hematology and Oncology)  CHIEF COMPLAINT: Estrogen receptor positive breast cancer  CURRENT TREATMENT: Letrozole , palbociclib   INTERVAL HISTORY:  History of Present Illness  [name] is a 78 year old female with metastatic cancer who presents for evaluation of disease progression.  A recent CT scan showed a hypodensity in the liver that increased in size from 1.1 cm to 2.5 x 2.0 cm. She has been on Ibrance  and letrozole  for approximately eight years.  She experiences significant nausea, which she attributes to a medication taken post-esophageal surgery in August. Despite discontinuing the medication two weeks post-surgery, nausea persists, especially two hours after eating. She manages it with crackers and Coke, which provide some relief. She also reports a decreased appetite, consuming only small portions of food.  She reports cognitive changes, feeling like she is 'losing her mind' and experiencing forgetfulness, which she attributes to aging. She also notes increased difficulty with walking.  Rest of the pertinent  10 point ROS reviewed and negative.  We are continuing to follow her tumor marker: Lab Results  Component Value Date   CA2729 46.1 (H) 03/21/2024   CA2729 50.7 (H) 01/02/2024   CA2729 48.7 (H) 09/19/2023   CA2729 35.9 06/26/2023   CA2729 33.2 03/28/2023    REVIEW OF SYSTEMS:   A detailed review of systems today was otherwise stable.   COVID 19 VACCINATION STATUS: Status post Pfizer x2 followed by booster August 2021; had COVID November 2022   BREAST CANCER HISTORY: From the original intake note:  [name] had screening mammography showing some suspicious calcifications in the right breast leading to right diagnostic mammography with ultrasonography 01/22/2016 at [place]. The breast density was category C. In the upper right breast there was a 2.3 cm mass with additional masses measuring 0.9 and 0.7 cm. There was also a possible additional 0.8 mass in the lower inner quadrant. Ultrasound confirmed an irregular hypoechoic mass in the right breast upper outer quadrant measuring 2.0 cm. There were other masses measuring 0.7 and 0.8 cm by ultrasonography. The right axilla was sonographically benign.  Biopsy of a 12:00 and 4:00 mass in the right breast 01/22/2016 showed ([id number]) both specimens showing invasive ductal carcinoma, grade 1 or 2, both 95% estrogen receptor positive, both 95% progesterone receptor positive, both with strong staining intensity, with MIB-1 ranging from 10-15%, and both HER-2 negative, the signals ratio being 1.23-1.42, and the number per cell 1.85-2.59.  Her subsequent history is as detailed below   PAST MEDICAL HISTORY: Past Medical History:  Diagnosis Date   Breast cancer (HCC)    Cancer (HCC) 02/2016   right breast   DIABETES MELLITUS, TYPE II 01/04/2007   only takes actoplus daily   Dizziness and giddiness 02/29/2008   DVT, HX OF  at age 89 in right buttocks   Dyspnea    due to lung cancer   Family history of breast cancer    GERD 01/04/2007   pt  reports resolved    GLAUCOMA 07/30/2008   both eyes   History of blood transfusion    no abnormal  reaction   History of uterine cancer 2000   hysterectomy done   HYPERLIPIDEMIA 01/04/2007   taking Pravastatin  daily   HYPERTENSION 01/04/2007   takes Lisinopril  daily   Joint pain    Joint swelling    Leg cramps    LEG PAIN, LEFT 07/06/2007   NUMBNESS 07/30/2008   in fingers;pt states from Diamox    OSTEOARTHRITIS, HIP 09/25/2009   OTITIS MEDIA, ACUTE, BILATERAL 02/29/2008   Overweight(278.02) 01/04/2007   Peripheral vascular disease    Personal history of radiation therapy 2018   Pneumonia    PONV (postoperative nausea and vomiting)    SLEEP APNEA, OBSTRUCTIVE    doesn't use a cpap;study done about 57yrs ago   TRANSIENT ISCHEMIC ATTACK, HX OF 01/04/2007   Vision loss    left eye    PAST SURGICAL HISTORY: Past Surgical History:  Procedure Laterality Date   ABDOMINAL HYSTERECTOMY  2000   BREAST LUMPECTOMY Right 01/13/2017   x2   BREAST LUMPECTOMY WITH RADIOACTIVE SEED AND SENTINEL LYMPH NODE BIOPSY Right 01/13/2017   Procedure: RIGHT BREAST RADIOACTIVE SEED X'S 2 GUIDED LUMPECTOMY WITH RADIOACTIVE SEED TARGETED AXILLARYLYMPH NODE EXCISION AND RIGHT AXILLARY SENTINEL LYMPH NODE BIOPSY;  Surgeon: Ethyl Lenis, MD;  Location: [facility] OR;  Service: General;  Laterality: Right;  2 SEEDS IN RIGHT BREAST 1 SEED IN RIGHT AXILLARY NODE   CHOLECYSTECTOMY     ENDOBRONCHIAL ULTRASOUND Bilateral 03/28/2016   Procedure: ENDOBRONCHIAL ULTRASOUND;  Surgeon: Lamar GORMAN Chris, MD;  Location: [facility];  Service: Cardiopulmonary;  Laterality: Bilateral;   EYE SURGERY  13   shunt left and lazer eye surgery on right cataract and retenia tear with repair   growth removal  2004   from thumb   KNEE ARTHROSCOPY Right    mulitple eye surgeries     both eyes, cataracts with ioc done both eyes   OOPHORECTOMY     right lumpectomy with axillary node dissection Right 01/2017   TOTAL HIP ARTHROPLASTY  06/24/2011    Procedure: TOTAL HIP ARTHROPLASTY;  Surgeon: Dempsey JINNY Sensor;  Location: [facility] OR;  Service: Orthopedics;  Laterality: Right;   TOTAL HIP ARTHROPLASTY Left 11/12/2012   Dr Sensor   TOTAL HIP ARTHROPLASTY Left 11/12/2012   Procedure: TOTAL HIP ARTHROPLASTY;  Surgeon: Dempsey JINNY Sensor, MD;  Location: [facility] OR;  Service: Orthopedics;  Laterality: Left;  DEPUY PINNACLE    FAMILY HISTORY Family History  Problem Relation Age of Onset   Breast cancer Mother 72   Dementia Mother    Cancer Mother        Breast and lung cancer   Stroke Sister    Breast cancer Sister 27   Heart attack Maternal Aunt    Lung cancer Maternal Grandmother        non smoker   Glaucoma Maternal Grandfather    Anesthesia problems Neg Hx    Colon cancer Neg Hx    Esophageal cancer Neg Hx    Rectal cancer Neg Hx    Stomach cancer Neg Hx   The patient's father died at age 34, the patient's mother died at age 9. She had breast and lung cancers diagnosed shortly before her death. The  patient had no brothers, 2 sisters. One sister was diagnosed with breast cancer at the age of 24.   GYNECOLOGIC HISTORY:  No LMP recorded. Patient has had a hysterectomy. Menarche age 47, first live birth age 57, the patient is GX P1. She had a hysterectomy for endometrial cancer in the year 2000. She did not take hormone replacement. She did use oral contraceptives for more than 20 years remotely, with no complications.   SOCIAL HISTORY: (Updated July 2021). 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 She is home with her husband [name]. He is a retired Curator.Their son [name] also lives in [place].  The patient has 3 grandchildren aged 63, 63 and 69    ADVANCED DIRECTIVES: In place   HEALTH MAINTENANCE: Social History   Tobacco Use   Smoking status: Never   Smokeless tobacco: Never  Vaping Use   Vaping status: Never Used  Substance Use Topics   Alcohol use: No   Drug  use: No     Colonoscopy: Never  PAP: Status post hysterectomy  Bone density: Remote   Allergies  Allergen Reactions   Codeine  Hives    Hycodan syrup   Fluorescein  Nausea And Vomiting    ? IV dye for retina specialist   Oxycodone  Nausea And Vomiting    Patient vomited for 3 days after taking   Lipitor [Atorvastatin  Calcium ] Other (See Comments)    Leg cramp   Sitagliptin Phosphate Nausea And Vomiting   Sulfa Drugs Cross Reactors Nausea And Vomiting    Current Outpatient Medications  Medication Sig Dispense Refill   acetaminophen  (TYLENOL ) 500 MG tablet Take 1,000 mg by mouth every 4 (four) hours as needed for moderate pain or fever.     aspirin  EC 81 MG tablet Take 81 mg by mouth daily at 6 PM. 1700     Biotin 1 MG CAPS Take by mouth.     brimonidine  (ALPHAGAN ) 0.2 % ophthalmic solution INSTILL 1 DROP INTO EACH EYE THREE TIMES DAILY     cholecalciferol (VITAMIN D) 1000 units tablet Take 1,000 Units by mouth daily.     dorzolamide-timolol  (COSOPT) 22.3-6.8 MG/ML ophthalmic solution Place 1 drop into both eyes 2 (two) times daily.     gabapentin  (NEURONTIN ) 100 MG capsule Take 200 mg at bedtime, can increase to 300 mg at bedtime if tolerated 90 capsule 3   ketorolac (ACULAR) 0.5 % ophthalmic solution Place 1 drop into the right eye 2 (two) times daily.     letrozole  (FEMARA ) 2.5 MG tablet Take 1 tablet by mouth once daily 90 tablet 0   lisinopril  (ZESTRIL ) 5 MG tablet Take 1 tablet by mouth once daily 90 tablet 0   lovastatin  (MEVACOR ) 20 MG tablet TAKE 1 TABLET BY MOUTH AT BEDTIME 90 tablet 3   meloxicam  (MOBIC ) 15 MG tablet Take 1 tablet (15 mg total) by mouth daily. Take with food (Patient not taking: Reported on 02/02/2024) 30 tablet 0   Netarsudil-Latanoprost (ROCKLATAN ) 0.02-0.005 % SOLN Apply to eye.     palbociclib  (IBRANCE ) 75 MG tablet Take 1 tablet (75 mg total) by mouth daily. Take for 21 days on, 7 days off, repeat every 28 days. 21 tablet 11   pantoprazole  (PROTONIX ) 40  MG tablet Take 1 tablet (40 mg total) by mouth daily. 30 tablet 11   pioglitazone -metformin  (ACTOPLUS MET ) 15-850 MG tablet Take 1 tablet by mouth daily. 90 tablet 3   prednisoLONE   acetate (PRED FORTE ) 1 % ophthalmic suspension SMARTSIG:1 In Eye(s) 6 Times Daily     No current facility-administered medications for this visit.     OBJECTIVE: white woman in no acute distress  Vitals:   03/21/24 0952  BP: (!) 160/54  Pulse: 78  Resp: 16  Temp: (!) 97.3 F (36.3 C)  SpO2: 96%     Wt Readings from Last 3 Encounters:  03/21/24 231 lb 3.2 oz (104.9 kg)  02/02/24 232 lb (105.2 kg)  01/31/24 232 lb (105.2 kg)   Body mass index is 39.69 kg/m.    ECOG FS:1 - Symptomatic but completely ambulatory  Physical Exam Constitutional:      Appearance: Normal appearance.  Cardiovascular:     Rate and Rhythm: Normal rate and regular rhythm.     Pulses: Normal pulses.     Heart sounds: Normal heart sounds.  Pulmonary:     Effort: Pulmonary effort is normal.     Breath sounds: Normal breath sounds.  Musculoskeletal:        General: No swelling. Normal range of motion.     Cervical back: Normal range of motion and neck supple. No rigidity.  Lymphadenopathy:     Cervical: No cervical adenopathy.  Skin:    General: Skin is warm and dry.  Neurological:     General: No focal deficit present.     Mental Status: She is alert.  Psychiatric:        Mood and Affect: Mood normal.      LAB RESULTS:  CMP     Component Value Date/Time   NA 139 03/21/2024 0939   NA 140 05/29/2017 1254   K 4.1 03/21/2024 0939   K 4.4 05/29/2017 1254   CL 106 03/21/2024 0939   CO2 27 03/21/2024 0939   CO2 25 05/29/2017 1254   GLUCOSE 236 (H) 03/21/2024 0939   GLUCOSE 154 (H) 05/29/2017 1254   BUN 19 03/21/2024 0939   BUN 11.5 05/29/2017 1254   CREATININE 0.78 03/21/2024 0939   CREATININE 0.8 05/29/2017 1254   CALCIUM  9.4 03/21/2024 0939   CALCIUM  9.3 05/29/2017 1254   PROT 6.8 03/21/2024 0939    PROT 7.0 05/29/2017 1254   ALBUMIN  4.0 03/21/2024 0939   ALBUMIN  4.1 05/29/2017 1254   AST 11 (L) 03/21/2024 0939   AST 13 05/29/2017 1254   ALT 12 03/21/2024 0939   ALT 14 05/29/2017 1254   ALKPHOS 52 03/21/2024 0939   ALKPHOS 66 05/29/2017 1254   BILITOT 0.4 03/21/2024 0939   BILITOT 0.50 05/29/2017 1254   GFRNONAA >60 03/21/2024 0939   GFRAA >60 03/10/2020 1138    INo results found for: SPEP, UPEP  Lab Results  Component Value Date   WBC 1.7 (L) 03/21/2024   NEUTROABS 1.0 (L) 03/21/2024   HGB 12.2 03/21/2024   HCT 34.5 (L) 03/21/2024   MCV 92.7 03/21/2024   PLT 179 03/21/2024      Chemistry      Component Value Date/Time   NA 139 03/21/2024 0939   NA 140 05/29/2017 1254   K 4.1 03/21/2024 0939   K 4.4 05/29/2017 1254   CL 106 03/21/2024 0939   CO2 27 03/21/2024 0939   CO2 25 05/29/2017 1254   BUN 19 03/21/2024 0939   BUN 11.5 05/29/2017 1254   CREATININE 0.78 03/21/2024 0939   CREATININE 0.8 05/29/2017 1254      Component Value Date/Time   CALCIUM  9.4 03/21/2024 0939   CALCIUM  9.3 05/29/2017 1254  ALKPHOS 52 03/21/2024 0939   ALKPHOS 66 05/29/2017 1254   AST 11 (L) 03/21/2024 0939   AST 13 05/29/2017 1254   ALT 12 03/21/2024 0939   ALT 14 05/29/2017 1254   BILITOT 0.4 03/21/2024 0939   BILITOT 0.50 05/29/2017 1254       No results found for: LABCA2  No components found for: [id number]  No results for input(s): INR in the last 168 hours.  Urinalysis    Component Value Date/Time   COLORURINE YELLOW 11/25/2022 1005   APPEARANCEUR CLEAR 11/25/2022 1005   LABSPEC 1.025 11/25/2022 1005   PHURINE 6.0 11/25/2022 1005   GLUCOSEU NEGATIVE 11/25/2022 1005   HGBUR NEGATIVE 11/25/2022 1005   BILIRUBINUR NEGATIVE 11/25/2022 1005   KETONESUR NEGATIVE 11/25/2022 1005   PROTEINUR 30 (A) 08/18/2017 1322   UROBILINOGEN 0.2 11/25/2022 1005   NITRITE POSITIVE (A) 11/25/2022 1005   LEUKOCYTESUR MODERATE (A) 11/25/2022 1005    STUDIES: CT CHEST  ABDOMEN PELVIS WO CONTRAST Result Date: 03/11/2024 CLINICAL DATA:  Metastatic disease evaluation, breast cancer * Tracking Code: BO * EXAM: CT CHEST, ABDOMEN AND PELVIS WITHOUT CONTRAST TECHNIQUE: Multidetector CT imaging of the chest, abdomen and pelvis was performed following the standard protocol without IV contrast. RADIATION DOSE REDUCTION: This exam was performed according to the departmental dose-optimization program which includes automated exposure control, adjustment of the mA and/or kV according to patient size and/or use of iterative reconstruction technique. COMPARISON:  08/14/2023 FINDINGS: CT CHEST FINDINGS Cardiovascular: Aortic atherosclerosis. Normal heart size. Three-vessel coronary artery calcifications no pericardial effusion. Mediastinum/Nodes: No enlarged mediastinal, hilar, or axillary lymph nodes. Trachea and esophagus demonstrate no significant findings. Lungs/Pleura: Subpleural radiation fibrosis of the right pulmonary apex and anterior right lung. Dependent bibasilar scarring or atelectasis. No pleural effusion or pneumothorax. Musculoskeletal: Right lumpectomy.  No acute osseous findings. CT ABDOMEN PELVIS FINDINGS Hepatobiliary: Interval enlargement of a hypodense lesion in the anterior liver dome, hepatic segment VIII, measuring 2.5 x 2.0 cm, previously 1.1 x 1.0 cm (series 2, image 39). Status post cholecystectomy. Unchanged postoperative biliary ductal dilatation. Pancreas: Unremarkable. No pancreatic ductal dilatation or surrounding inflammatory changes. Spleen: Normal in size without significant abnormality. Adrenals/Urinary Tract: Adrenal glands are unremarkable. Unchanged hyperdense hemorrhagic or proteinaceous cyst of the superior pole of the right kidney requiring no specific further follow-up or characterization. Kidneys are otherwise normal, without renal calculi, solid lesion, or hydronephrosis. Bladder is unremarkable. Stomach/Bowel: Stomach is within normal limits.  Appendix appears normal. No evidence of bowel wall thickening, distention, or inflammatory changes. Descending and sigmoid diverticulosis. Vascular/Lymphatic: Aortic atherosclerosis. No enlarged abdominal or pelvic lymph nodes. Reproductive: Hysterectomy. Other: Fat containing midline ventral hernia.  No ascites. Musculoskeletal: No acute osseous findings. Bilateral hip total arthroplasty. IMPRESSION: 1. Interval enlargement of a hypodense lesion in the anterior liver dome, consistent with an enlarging metastasis. 2. Right lumpectomy with subpleural radiation fibrosis of the right pulmonary apex and anterior right lung. 3. Coronary artery disease. Aortic Atherosclerosis (ICD10-I70.0). Electronically Signed   By: Marolyn JONETTA Jaksch M.D.   On: 03/11/2024 10:23      ELIGIBLE FOR AVAILABLE RESEARCH PROTOCOL: no  ASSESSMENT: 78 y.o. [place] woman with a remote history of early stage endometrial cancer, subsequently status post right breast upper outer quadrant biopsy 01/22/2016 for a clinically multifocal T2 N0, stage 2A invasive ductal carcinoma, grade 1, estrogen and progesterone receptor positive, HER-2 negative, with an MIB-1 between 10 and 15%.  (1) right axillary lymph node biopsy 03/02/2016 positive  (2) genetics testing 01/13/2016 through  the Custom gene panel offered by [facility] found no deleterious mutations in  ATM, BARD1, BRCA1, BRCA2, BRIP1, CDH1, CHEK2, EPCAM, FANCC, MLH1, MSH2, MSH6, MUTYH, NBN, PALB2, PMS2, POLD1, PTEN, RAD51C, RAD51D, TP53, and XRCC2  METASTATIC DISEASE: OCT 2017 (3) CT scans of the chest abdomen and pelvis obtained 03/10/2016 are consistent with bilateral lung metastases and mediastinal and hilar nodal involvement, but no liver or bone spread  (a) bronchoscopic lymph node biopsy 2 (station 7, 13R) 03/28/2016 confirms metastatic adenocarcinoma, estrogen receptor positive, HER-2 not amplified  (b) baseline CA-27-29 on 04/18/2016 was 137.5.  (4) letrozole  started  03/15/2016, palbociclib  added 03/29/2016 at 125 mg/day, 21/7  (a) dose decreased to 100 mg per day, 21/7, beginning with February cycle  (b) palbociclib  held 01/31/2017, with increasing symptoms  (c) palbociclib  resumed October 2018 at 75 mg daily  (d) palbociclib  dose reduced to 75 mg every other day February through April 2019  (e) palbociclib  dose resumed at 75 mg daily as of 10/17/2017  (f) palbociclib  dose decreased to 75 mg every other day beginning 07/31/2019  (g) palbociclib  resumed at 75 mg daily, 21 days on 7 off, as of 08/25/2019  (5) status post double right lumpectomies and right axillary lymph node sampling 01/13/2017 for 2 separate invasive ductal carcinoma lesions, pT1a and pT1b, N1a, with negative margins, both lesions being estrogen and progesterone receptor positive and HER-2 negative  (6) adjuvant radiation completed 07/05/2017 1. 50.4 Gy in 28 fractions to the right breast and supraclavicular region using whole-breast tangent fields. 2. Boost to the seroma delivered an additional 10 Gy in 5 fractions. The total dose was 60.4 Gy.  (7) restaging studies:  (a) CT scan of the chest and bone scan 06/23/2017 showed stable scattered very small lung nodules, no bone lesions  (b) CT of the chest 10/17/2017 showed no new or progressive metastatic disease in the chest. The small left lower lobe pulmonary nodule is stable  (c) PET scan on 03/02/2018: shows no findings for residual or recurrent right breast cancer  (d) chest CT scan stable, questionable right renal cyst noted  (e) chest CT 09/18/2020 shows no measurable disease  (f) to the CT scan 04/29/2021 shows no evidence of active disease; a 2 cm thyroid  nodule was noted   (8) right upper pole renal lesion noted to be enlarging on CT scan 06/22/2017  (a) no uptake on PET scan obtained 03/02/2018  #9 Most recent imaging with no new suspicious mass or lymphadenopathy identified in the chest abdomen or pelvis.  Stable chronic  pleural and parenchymal scarring densities in the right lung apex.  Stable chronic moderate to severe biliary ductal dilatation.  Stable chronic 11 mm left adrenal gland nodule.  No dedicated follow-up required   PLAN:  She continues on Ibrance  75 mg once daily 3 weeks on 1 week off and letrozole  daily.   Assessment & Plan  Assessment and Plan Assessment & Plan Metastatic breast cancer with liver progression CT scan shows liver lesion enlargement, indicating resistance to Ibrance  and letrozole . - Discontinue letrozole . - Initiate Faslodex injections with loading dose biweekly for three doses, then monthly. - Continue Ibrance . - Order Guardant 360 blood test for mutation analysis. - Submit Faslodex prescription for insurance approval.  Leukopenia secondary to Ibrance  Leukopenia with WBC 1700 and neutrophils 1000 post-Ibrance  cycle. - No dose adjustment required.  Chronic nausea Chronic nausea persists, possibly worsened by pantoprazole . - Manage with dietary adjustments and antacids like Tums.    Total time spent: 40 min  Thank you for consulting us  in the care of this patient.  Please not hesitate contact us  with any additional questions or concerns.  *Total Encounter Time as defined by the Centers for Medicare and Medicaid Services includes, in addition to the face-to-face time of a patient visit (documented in the note above) non-face-to-face time: obtaining and reviewing outside history, ordering and reviewing medications, tests or procedures, care coordination (communications with other health care professionals or caregivers) and documentation in the medical record.  Amber Stalls MD

## 2024-03-22 ENCOUNTER — Encounter: Payer: Self-pay | Admitting: Hematology and Oncology

## 2024-03-22 LAB — CANCER ANTIGEN 27.29: CA 27.29: 46.1 U/mL — ABNORMAL HIGH (ref 0.0–38.6)

## 2024-03-27 DIAGNOSIS — C50411 Malignant neoplasm of upper-outer quadrant of right female breast: Secondary | ICD-10-CM | POA: Diagnosis not present

## 2024-03-27 DIAGNOSIS — C7800 Secondary malignant neoplasm of unspecified lung: Secondary | ICD-10-CM | POA: Diagnosis not present

## 2024-03-28 ENCOUNTER — Inpatient Hospital Stay: Admitting: Pharmacist

## 2024-03-28 ENCOUNTER — Inpatient Hospital Stay

## 2024-03-28 VITALS — BP 100/75 | HR 79 | Temp 98.0°F | Resp 18 | Ht 64.0 in | Wt 231.3 lb

## 2024-03-28 DIAGNOSIS — C7800 Secondary malignant neoplasm of unspecified lung: Secondary | ICD-10-CM

## 2024-03-28 DIAGNOSIS — Z170 Estrogen receptor positive status [ER+]: Secondary | ICD-10-CM

## 2024-03-28 DIAGNOSIS — Z5111 Encounter for antineoplastic chemotherapy: Secondary | ICD-10-CM | POA: Diagnosis not present

## 2024-03-28 MED ORDER — FULVESTRANT 250 MG/5ML IM SOSY
500.0000 mg | PREFILLED_SYRINGE | Freq: Once | INTRAMUSCULAR | Status: AC
  Administered 2024-03-28: 500 mg via INTRAMUSCULAR
  Filled 2024-03-28: qty 10

## 2024-03-28 NOTE — Progress Notes (Signed)
 [facility]       Telephone: [phone]?Fax: [phone]   Oncology Clinical Pharmacist Practitioner Initial Assessment  [name] is a 78 y.o. female with a diagnosis of breast cancer. They were contacted today via in-person visit.  Indication/Regimen Fulvestrant (Faslodex) is being used appropriately for treatment of metastatic breast cancer by Dr. Amber Stalls.      Wt Readings from Last 1 Encounters:  03/28/24 231 lb 4.8 oz (104.9 kg)    Estimated body surface area is 2.18 meters squared as calculated from the following:   Height as of this encounter: 5' 4 (1.626 m).   Weight as of this encounter: 231 lb 4.8 oz (104.9 kg).  The starting dose regimen is 500 mg by intramuscular route on days 1, 15, and 29. The maintenance dosing regimen is 500 mg by intramuscular route every 28 days. This is being given  in combination with palbociclib  which was started on 03/29/16. It is planned to continue until disease progression or unacceptable toxicity.  Patient was recently on letrozole  but due to suspected progression in the liver, Dr. Stalls is switching patient to fulvestrant. She continues on palbociclib .  Dose Modifications None   Allergies Allergies  Allergen Reactions   Codeine  Hives    Hycodan syrup   Fluorescein  Nausea And Vomiting    ? IV dye for retina specialist   Oxycodone  Nausea And Vomiting    Patient vomited for 3 days after taking   Lipitor [Atorvastatin  Calcium ] Other (See Comments)    Leg cramp   Sitagliptin Phosphate Nausea And Vomiting   Sulfa Drugs Cross Reactors Nausea And Vomiting    Vitals    03/28/2024   10:20 AM 03/21/2024    9:52 AM 02/02/2024   12:24 PM  Oncology Vitals  Height 163 cm    Weight 104.917 kg 104.872 kg   Weight (lbs) 231 lbs 5 oz 231 lbs 3 oz   BMI 39.7 kg/m2 39.69 kg/m2   Temp 98 F (36.7 C) 97.3 F (36.3 C)   Pulse Rate 79 78 86  BP 100/75 160/54 182/64  Resp 18 16 16   SpO2 100 % 96 % 96 %  BSA  (m2) 2.18 m2 2.18 m2      Laboratory Data    Latest Ref Rng & Units 03/21/2024    9:38 AM 01/02/2024   10:54 AM 09/19/2023   10:35 AM  CBC EXTENDED  WBC 4.0 - 10.5 K/uL 1.7  2.6  2.5   RBC 3.87 - 5.11 MIL/uL 3.72  3.89  4.08   Hemoglobin 12.0 - 15.0 g/dL 87.7  87.2  86.7   HCT 36.0 - 46.0 % 34.5  37.8  37.4   Platelets 150 - 400 K/uL 179  142  193   NEUT# 1.7 - 7.7 K/uL 1.0  1.3  1.7   Lymph# 0.7 - 4.0 K/uL 0.5  0.8  0.6        Latest Ref Rng & Units 03/21/2024    9:39 AM 01/02/2024   12:08 PM 09/19/2023   10:35 AM  CMP  Glucose 70 - 99 mg/dL 763  824  645   BUN 8 - 23 mg/dL 19  16  15    Creatinine 0.44 - 1.00 mg/dL 9.21  9.32  9.19   Sodium 135 - 145 mmol/L 139  140  136   Potassium 3.5 - 5.1 mmol/L 4.1  4.5  4.4   Chloride 98 - 111 mmol/L 106  106  104   CO2 22 - 32 mmol/L 27  27  26    Calcium  8.9 - 10.3 mg/dL 9.4  9.2  9.2   Total Protein 6.5 - 8.1 g/dL 6.8  7.0  7.0   Total Bilirubin 0.0 - 1.2 mg/dL 0.4  0.5  0.5   Alkaline Phos 38 - 126 U/L 52  61  70   AST 15 - 41 U/L 11  13  13    ALT 0 - 44 U/L 12  13  16     No results found for: MG Lab Results  Component Value Date   CA2729 46.1 (H) 03/21/2024   CA2729 50.7 (H) 01/02/2024   CA2729 48.7 (H) 09/19/2023     Contraindications Contraindications were reviewed? Yes Contraindications to therapy were identified? No   Safety Precautions The following safety precautions for the use of fulvestrant were reviewed:  Fever: reviewed the importance of having a thermometer and the Centers for Disease Control and Prevention (CDC) definition of fever which is 100.37F (38C) or higher. Patient should call 24/7 triage at [phone] if experiencing a fever or any other symptoms Risk of Bleeding Increased exposure in patients with hepatic impairment Injection site reactions  Medication Reconciliation Current Outpatient Medications  Medication Sig Dispense Refill   acetaminophen  (TYLENOL ) 500 MG tablet Take 1,000 mg by mouth  every 4 (four) hours as needed for moderate pain or fever. (Patient taking differently: Take 1,000 mg by mouth every 6 (six) hours as needed for moderate pain (pain score 4-6) or fever.)     aspirin  EC 81 MG tablet Take 81 mg by mouth daily at 6 PM. 1700     Biotin 1 MG CAPS Take by mouth.     brimonidine  (ALPHAGAN ) 0.2 % ophthalmic solution INSTILL 1 DROP INTO EACH EYE THREE TIMES DAILY     cholecalciferol (VITAMIN D) 1000 units tablet Take 1,000 Units by mouth daily.     dorzolamide-timolol  (COSOPT) 22.3-6.8 MG/ML ophthalmic solution Place 1 drop into both eyes 2 (two) times daily.     gabapentin  (NEURONTIN ) 100 MG capsule Take 200 mg at bedtime, can increase to 300 mg at bedtime if tolerated 90 capsule 3   ketorolac (ACULAR) 0.5 % ophthalmic solution Place 1 drop into the right eye 2 (two) times daily.     lisinopril  (ZESTRIL ) 5 MG tablet Take 1 tablet by mouth once daily 90 tablet 0   lovastatin  (MEVACOR ) 20 MG tablet TAKE 1 TABLET BY MOUTH AT BEDTIME 90 tablet 3   meloxicam  (MOBIC ) 15 MG tablet Take 1 tablet (15 mg total) by mouth daily. Take with food 30 tablet 0   Netarsudil-Latanoprost (ROCKLATAN ) 0.02-0.005 % SOLN Apply to eye.     palbociclib  (IBRANCE ) 75 MG tablet Take 1 tablet (75 mg total) by mouth daily. Take for 21 days on, 7 days off, repeat every 28 days. 21 tablet 11   pantoprazole  (PROTONIX ) 40 MG tablet Take 1 tablet (40 mg total) by mouth daily. 30 tablet 11   pioglitazone -metformin  (ACTOPLUS MET ) 15-850 MG tablet Take 1 tablet by mouth daily. 90 tablet 3   prednisoLONE  acetate (PRED FORTE ) 1 % ophthalmic suspension SMARTSIG:1 In Eye(s) 6 Times Daily     No current facility-administered medications for this visit.   Facility-Administered Medications Ordered in Other Visits  Medication Dose Route Frequency Provider Last Rate Last Admin   fulvestrant (FASLODEX) injection 500 mg  500 mg Intramuscular Once Iruku, Praveena, MD        Medication reconciliation is  based on the  patient's most recent medication list in the electronic medical record (EMR) including herbal products and OTC medications.   The patient's medication list was reviewed today with the patient? Yes   Drug-drug interactions (DDIs) DDIs were evaluated? Yes Significant DDIs identified? No   Drug-Food Interactions Drug-food interactions were evaluated? Yes Drug-food interactions identified? No   Follow-up Plan  Start fulvestrant 500 mg IM today. Dose #2 scheduled for 04/11/24, dose #3 scheduled for 04/25/24. Then every 28 days Continue palbociclib  1 tablet (75 mg total) by mouth for 21 days, followed by a 7 day rest period. She is following with Dr. Rollene for diabetes management Ms. Bowden did not want anti-nausea medications sent at this time Ms. Kiddy asked about other treatment modalities for her liver and asked if we could forward her inquiry to Dr. Loretha which will be done Will add labs, Dr. Loretha visit prior to 04/25/24 fulvestrant injection Distress assessment not completed as patient has been on several lines of chemotherapy prior. Can see clinical pharmacy back as deemed necessary by Dr. Loretha.  Dickey JAYSON Brain participated in the discussion, expressed understanding, and voiced agreement with the above plan. All questions were answered to her satisfaction. The patient was advised to contact the clinic at [phone] with any questions or concerns prior to her return visit.   I spent 30 minutes assessing the patient.  Norina Cowper A. Lucila, PharmD, BCOP, CPP  Norleen DELENA Lucila, RPH-CPP, 03/28/2024 10:55 AM  **Disclaimer: This note was dictated with voice recognition software. Similar sounding words can inadvertently be transcribed and this note may contain transcription errors which may not have been corrected upon publication of note.**

## 2024-04-01 ENCOUNTER — Encounter: Payer: Self-pay | Admitting: Hematology and Oncology

## 2024-04-03 ENCOUNTER — Encounter: Admitting: Internal Medicine

## 2024-04-03 ENCOUNTER — Other Ambulatory Visit

## 2024-04-03 ENCOUNTER — Ambulatory Visit: Admitting: Hematology and Oncology

## 2024-04-04 LAB — GUARDANT 360

## 2024-04-09 ENCOUNTER — Other Ambulatory Visit

## 2024-04-09 ENCOUNTER — Encounter: Payer: Self-pay | Admitting: Family Medicine

## 2024-04-09 ENCOUNTER — Ambulatory Visit (INDEPENDENT_AMBULATORY_CARE_PROVIDER_SITE_OTHER): Admitting: Family Medicine

## 2024-04-09 ENCOUNTER — Ambulatory Visit: Admitting: Hematology and Oncology

## 2024-04-09 ENCOUNTER — Ambulatory Visit: Payer: Self-pay

## 2024-04-09 VITALS — BP 146/82 | HR 62 | Temp 97.7°F | Ht 64.0 in | Wt 232.2 lb

## 2024-04-09 DIAGNOSIS — E785 Hyperlipidemia, unspecified: Secondary | ICD-10-CM

## 2024-04-09 DIAGNOSIS — Z7984 Long term (current) use of oral hypoglycemic drugs: Secondary | ICD-10-CM | POA: Diagnosis not present

## 2024-04-09 DIAGNOSIS — E1169 Type 2 diabetes mellitus with other specified complication: Secondary | ICD-10-CM | POA: Diagnosis not present

## 2024-04-09 DIAGNOSIS — R300 Dysuria: Secondary | ICD-10-CM | POA: Diagnosis not present

## 2024-04-09 LAB — POCT URINALYSIS DIP (CLINITEK)
Bilirubin, UA: NEGATIVE
Blood, UA: NEGATIVE
Glucose, UA: >=1000 mg/dL — AB
Ketones, POC UA: NEGATIVE mg/dL
Nitrite, UA: NEGATIVE
Spec Grav, UA: 1.015 (ref 1.010–1.025)
Urobilinogen, UA: 0.2 U/dL
pH, UA: 6 (ref 5.0–8.0)

## 2024-04-09 LAB — POCT GLYCOSYLATED HEMOGLOBIN (HGB A1C): Hemoglobin A1C: 7.5 % — AB (ref 4.0–5.6)

## 2024-04-09 MED ORDER — NITROFURANTOIN MONOHYD MACRO 100 MG PO CAPS: 100.0000 mg | ORAL_CAPSULE | Freq: Two times a day (BID) | ORAL | 0 refills | Status: AC

## 2024-04-09 NOTE — Patient Instructions (Addendum)
 Your urine was concerning for infection in the office today. We have sent it to the lab for culture and confirmation.  I have sent in Macrobid  for you to take twice a day for 5 days.  Please take all of these antibiotics, even if you are beginning to feel better.  A1c is 7.5 today. Continue current medication regimen.   Please follow-up with me if symptoms are persisting despite antibiotic treatment.

## 2024-04-09 NOTE — Progress Notes (Signed)
 Acute Office Visit  Subjective:     Patient ID: [name], female    DOB: Nov 22, 1945, 78 y.o.   MRN: [id number]  Chief Complaint  Patient presents with   Acute Visit    Urinary frequency and burning ongoing for about 1 week. Took 2 days of AZO    HPI  Discussed the use of AI scribe software for clinical note transcription with the patient, who gave verbal consent to proceed.  History of Present Illness KAIDENCE SANT is a 78 year old female with diabetes and metastatic cancer who presents with symptoms of a urinary tract infection.  Urinary symptoms - Urinary urgency without dysuria - Sensation of urinary tract infection - Similar episode earlier in the year treated with Macrobid   Antibiotic allergies - Allergic to sulfa antibiotics, which cause vomiting - Allergic to oxacillin, which causes headaches  Diabetes mellitus - Managed with Pioglitazone /Metformin  - Hemoglobin A1c is 7.5 - Uses eye drops five times daily for eye edema, which she attributes to diabetes medication  Metastatic malignancy - Recently diagnosed with liver metastasis - Began receiving injections for metastatic disease two weeks ago - Current medication regimen includes 19 pills and eye drops daily, totaling 21 with the injections  Cardiovascular disease - History of coronary artery disease - Takes lovastatin  and lisinopril  - No palpitations or chest pain     ROS Per HPI      Objective:    BP (!) 146/82 (BP Location: Left Arm, Patient Position: Sitting)   Pulse 62   Temp 97.7 F (36.5 C) (Temporal)   Ht 5' 4 (1.626 m)   Wt 232 lb 3.2 oz (105.3 kg)   SpO2 95%   BMI 39.86 kg/m    Physical Exam Vitals and nursing note reviewed.  Constitutional:      General: She is not in acute distress. HENT:     Head: Normocephalic and atraumatic.     Right Ear: External ear normal.     Left Ear: External ear normal.     Nose: Nose normal.     Mouth/Throat:     Mouth: Mucous membranes  are moist.     Pharynx: Oropharynx is clear.  Eyes:     Extraocular Movements: Extraocular movements intact.     Pupils: Pupils are equal, round, and reactive to light.  Cardiovascular:     Rate and Rhythm: Normal rate and regular rhythm.     Pulses: Normal pulses.     Heart sounds: Normal heart sounds.  Pulmonary:     Effort: Pulmonary effort is normal. No respiratory distress.     Breath sounds: Normal breath sounds. No wheezing, rhonchi or rales.  Musculoskeletal:        General: Normal range of motion.     Cervical back: Normal range of motion.     Right lower leg: No edema.     Left lower leg: No edema.  Lymphadenopathy:     Cervical: No cervical adenopathy.  Neurological:     General: No focal deficit present.     Mental Status: She is alert and oriented to person, place, and time.  Psychiatric:        Mood and Affect: Mood normal.        Thought Content: Thought content normal.     Results for orders placed or performed in visit on 04/09/24  POCT URINALYSIS DIP (CLINITEK)  Result Value Ref Range   Color, UA yellow yellow   Clarity, UA cloudy (A)  clear   Glucose, UA >=1,000 (A) negative mg/dL   Bilirubin, UA negative negative   Ketones, POC UA negative negative mg/dL   Spec Grav, UA 8.984 8.989 - 1.025   Blood, UA negative negative   pH, UA 6.0 5.0 - 8.0   POC PROTEIN,UA trace negative, trace   Urobilinogen, UA 0.2 0.2 or 1.0 E.U./dL   Nitrite, UA Negative Negative   Leukocytes, UA Large (3+) (A) Negative  POCT HgB A1C  Result Value Ref Range   Hemoglobin A1C 7.5 (A) 4.0 - 5.6 %   HbA1c POC (<> result, manual entry)     HbA1c, POC (prediabetic range)     HbA1c, POC (controlled diabetic range)          Assessment & Plan:   Assessment and Plan Assessment & Plan Dysuria Recurrent UTI with leukocytes in urine. Previous nitrofurantoin  effective. - Prescribed nitrofurantoin  100 mg BID for 5 days. - Sent urine for culture.  Hyperlipidemia associated with  type 2 diabetes mellitus Type 2 diabetes with A1c of 7.5%. Current treatment includes pioglitazone /metformin .  - Continue lovastatin        Orders Placed This Encounter  Procedures   Urine Culture    Standing Status:   Future    Number of Occurrences:   1    Expected Date:   04/09/2024    Expiration Date:   04/09/2025   POCT URINALYSIS DIP (CLINITEK)   POCT HgB A1C     Meds ordered this encounter  Medications   nitrofurantoin , macrocrystal-monohydrate, (MACROBID ) 100 MG capsule    Sig: Take 1 capsule (100 mg total) by mouth 2 (two) times daily for 5 days.    Dispense:  10 capsule    Refill:  0    Return if symptoms worsen or fail to improve.  Corean LITTIE Ku, FNP

## 2024-04-09 NOTE — Telephone Encounter (Signed)
 FYI Only or Action Required?: FYI only for provider.  Patient was last seen in primary care on 08/25/2023 by Rollene Almarie LABOR, MD.  Called Nurse Triage reporting Dysuria.  Symptoms began 1.5 weeks.  Interventions attempted: OTC medications: AZO, Rest, hydration, or home remedies, and Other: cranberry juice.  Symptoms are: burning with urination, urinary frequency stable.  Triage Disposition: See Physician Within 24 Hours  Patient/caregiver understands and will follow disposition?: Yes             Copied from CRM [id number]. Topic: Clinical - Red Word Triage >> Apr 09, 2024  8:06 AM Thersia BROCKS wrote: [facility] that prompted transfer to Nurse Triage: Patient called in stated she has some burning in her bladder, needs a prescription wanted to know if she needs to go to urgent care or be seen Reason for Disposition  Age > 50 years  Answer Assessment - Initial Assessment Questions Patient insists that she does not think she needs to come in for an appointment. RN advised for possible UTI, urine sample/testing is most likely needed and a provider needs to assess and order and diagnostic testing. Patient states Dr Rollene doesn't have enough time to just put those orders in for me?. Educated patient on need for seeing a provider and she is upset but agreed to acute visit in office today.  1. SEVERITY: How bad is the pain?  (e.g., Scale 1-10; mild, moderate, or severe)     Burning; states it is not as bad today. Worse on the weekend. Moderate.  2. FREQUENCY: How many times have you had painful urination today?      5 times.  3. PATTERN: Is pain present every time you urinate or just sometimes?      Every time.  4. ONSET: When did the painful urination start?      X 1.5 weeks.  5. FEVER: Do you have a fever? If Yes, ask: What is your temperature, how was it measured, and when did it start?     No.  6. PAST UTI: Have you had a urine infection before? If Yes,  ask: When was the last time? and What happened that time?      Yes, she states she gets one about once a year. She states she normally comes in the office to be treated.  7. CAUSE: What do you think is causing the painful urination?  (e.g., UTI, scratch, Herpes sore)     UTI.  8. OTHER SYMPTOMS: Do you have any other symptoms? (e.g., blood in urine, flank pain, genital sores, urgency, vaginal discharge)     Urinary frequency. Denies nausea, vomiting, new back or flank pain (patient states she has had back pain since last year).  Patient states she has treated OTC with AZO, cranberry juice, and drinking plenty of fluids.  Protocols used: Urination Pain - Female-A-AH

## 2024-04-10 DIAGNOSIS — H20013 Primary iridocyclitis, bilateral: Secondary | ICD-10-CM | POA: Diagnosis not present

## 2024-04-11 ENCOUNTER — Other Ambulatory Visit: Payer: Self-pay

## 2024-04-11 ENCOUNTER — Telehealth: Payer: Self-pay | Admitting: Internal Medicine

## 2024-04-11 ENCOUNTER — Ambulatory Visit: Payer: Self-pay | Admitting: Family Medicine

## 2024-04-11 ENCOUNTER — Other Ambulatory Visit: Payer: Self-pay | Admitting: Pharmacy Technician

## 2024-04-11 ENCOUNTER — Telehealth: Payer: Self-pay

## 2024-04-11 ENCOUNTER — Inpatient Hospital Stay

## 2024-04-11 ENCOUNTER — Ambulatory Visit: Admitting: Internal Medicine

## 2024-04-11 VITALS — BP 186/63 | HR 71 | Temp 98.3°F | Resp 20

## 2024-04-11 DIAGNOSIS — C7800 Secondary malignant neoplasm of unspecified lung: Secondary | ICD-10-CM

## 2024-04-11 DIAGNOSIS — Z5111 Encounter for antineoplastic chemotherapy: Secondary | ICD-10-CM | POA: Diagnosis not present

## 2024-04-11 LAB — URINE CULTURE

## 2024-04-11 MED ORDER — FULVESTRANT 250 MG/5ML IM SOSY
500.0000 mg | PREFILLED_SYRINGE | Freq: Once | INTRAMUSCULAR | Status: AC
  Administered 2024-04-11: 500 mg via INTRAMUSCULAR
  Filled 2024-04-11: qty 10

## 2024-04-11 NOTE — Progress Notes (Signed)
 Clinical Intervention Note  Clinical Intervention Notes: Pt reported starting Macrobid  for a bladder infection, no DDIs were identified with her Ibrance .   Clinical Intervention Outcomes: Prevention of an adverse drug event   Silvano LOISE Blair Karel Santa

## 2024-04-11 NOTE — Progress Notes (Signed)
 Specialty Pharmacy Refill Coordination Note  [name] is a 78 y.o. female contacted today regarding refills of specialty medication(s) Palbociclib  (IBRANCE )   Patient requested [name] at [facility] at [place] date: 04/12/24   Medication will be filled on: 04/11/24

## 2024-04-12 ENCOUNTER — Ambulatory Visit: Admitting: Internal Medicine

## 2024-04-16 ENCOUNTER — Inpatient Hospital Stay: Admitting: Hematology and Oncology

## 2024-04-16 ENCOUNTER — Encounter (HOSPITAL_COMMUNITY): Payer: Self-pay

## 2024-04-16 ENCOUNTER — Inpatient Hospital Stay: Attending: Hematology and Oncology

## 2024-04-16 ENCOUNTER — Other Ambulatory Visit: Payer: Self-pay | Admitting: *Deleted

## 2024-04-16 VITALS — BP 188/67 | HR 98 | Temp 97.2°F | Resp 18 | Wt 234.8 lb

## 2024-04-16 DIAGNOSIS — C773 Secondary and unspecified malignant neoplasm of axilla and upper limb lymph nodes: Secondary | ICD-10-CM | POA: Insufficient documentation

## 2024-04-16 DIAGNOSIS — C7800 Secondary malignant neoplasm of unspecified lung: Secondary | ICD-10-CM

## 2024-04-16 DIAGNOSIS — Z1721 Progesterone receptor positive status: Secondary | ICD-10-CM | POA: Diagnosis not present

## 2024-04-16 DIAGNOSIS — R112 Nausea with vomiting, unspecified: Secondary | ICD-10-CM | POA: Diagnosis not present

## 2024-04-16 DIAGNOSIS — C50911 Malignant neoplasm of unspecified site of right female breast: Secondary | ICD-10-CM

## 2024-04-16 DIAGNOSIS — C7802 Secondary malignant neoplasm of left lung: Secondary | ICD-10-CM | POA: Insufficient documentation

## 2024-04-16 DIAGNOSIS — R530 Neoplastic (malignant) related fatigue: Secondary | ICD-10-CM | POA: Insufficient documentation

## 2024-04-16 DIAGNOSIS — C50411 Malignant neoplasm of upper-outer quadrant of right female breast: Secondary | ICD-10-CM | POA: Insufficient documentation

## 2024-04-16 DIAGNOSIS — C7801 Secondary malignant neoplasm of right lung: Secondary | ICD-10-CM | POA: Insufficient documentation

## 2024-04-16 DIAGNOSIS — Z5111 Encounter for antineoplastic chemotherapy: Secondary | ICD-10-CM | POA: Diagnosis not present

## 2024-04-16 DIAGNOSIS — Z170 Estrogen receptor positive status [ER+]: Secondary | ICD-10-CM | POA: Insufficient documentation

## 2024-04-16 DIAGNOSIS — C787 Secondary malignant neoplasm of liver and intrahepatic bile duct: Secondary | ICD-10-CM

## 2024-04-16 DIAGNOSIS — Z1732 Human epidermal growth factor receptor 2 negative status: Secondary | ICD-10-CM | POA: Diagnosis not present

## 2024-04-16 LAB — CBC WITH DIFFERENTIAL (CANCER CENTER ONLY)
Abs Immature Granulocytes: 0.01 K/uL (ref 0.00–0.07)
Basophils Absolute: 0 K/uL (ref 0.0–0.1)
Basophils Relative: 1 %
Eosinophils Absolute: 0 K/uL (ref 0.0–0.5)
Eosinophils Relative: 1 %
HCT: 34.9 % — ABNORMAL LOW (ref 36.0–46.0)
Hemoglobin: 12.1 g/dL (ref 12.0–15.0)
Immature Granulocytes: 0 %
Lymphocytes Relative: 30 %
Lymphs Abs: 0.7 K/uL (ref 0.7–4.0)
MCH: 32.9 pg (ref 26.0–34.0)
MCHC: 34.7 g/dL (ref 30.0–36.0)
MCV: 94.8 fL (ref 80.0–100.0)
Monocytes Absolute: 0.3 K/uL (ref 0.1–1.0)
Monocytes Relative: 11 %
Neutro Abs: 1.3 K/uL — ABNORMAL LOW (ref 1.7–7.7)
Neutrophils Relative %: 57 %
Platelet Count: 182 K/uL (ref 150–400)
RBC: 3.68 MIL/uL — ABNORMAL LOW (ref 3.87–5.11)
RDW: 14.6 % (ref 11.5–15.5)
WBC Count: 2.4 K/uL — ABNORMAL LOW (ref 4.0–10.5)
nRBC: 0 % (ref 0.0–0.2)

## 2024-04-16 LAB — CMP (CANCER CENTER ONLY)
ALT: 12 U/L (ref 0–44)
AST: 12 U/L — ABNORMAL LOW (ref 15–41)
Albumin: 4 g/dL (ref 3.5–5.0)
Alkaline Phosphatase: 53 U/L (ref 38–126)
Anion gap: 6 (ref 5–15)
BUN: 16 mg/dL (ref 8–23)
CO2: 28 mmol/L (ref 22–32)
Calcium: 9 mg/dL (ref 8.9–10.3)
Chloride: 107 mmol/L (ref 98–111)
Creatinine: 0.77 mg/dL (ref 0.44–1.00)
GFR, Estimated: >60 mL/min (ref >60)
Glucose, Bld: 136 mg/dL — ABNORMAL HIGH (ref 70–99)
Potassium: 4.2 mmol/L (ref 3.5–5.1)
Sodium: 141 mmol/L (ref 135–145)
Total Bilirubin: 0.4 mg/dL (ref 0.0–1.2)
Total Protein: 6.6 g/dL (ref 6.5–8.1)

## 2024-04-16 NOTE — Progress Notes (Signed)
 amagata, Glenn, MD  Michaelene Setter PROCEDURE / BIOPSY REVIEW Date: 04/16/24  Requested Biopsy site: Liver Reason for request: Enlarging liver lesion, hx breast CA. Imaging review: Best seen on CT  Decision: Approved Imaging modality to perform: Ultrasound Schedule with: Moderate Sedation Schedule for: Any VIR  Additional comments:   Please contact me with questions, concerns, or if issue pertaining to this request arise.  Marcey ONEIDA Moan, MD [facility]       Previous Messages    ----- Message ----- From: Ninnie Fein Sent: 04/16/2024   4:06 PM EST To: Shauntell Iglesia; Ir Procedure Requests Subject: CT LIVER MASS BIOPSY                          Procedure - CT liver mass biopsy  Reason : Metastatic breast cancer Dx: Adenocarcinoma of breast metastatic to liver, right (HCC) [C50.911, C78.7 (ICD-10-CM)]    History : CT Chest abd pelv w/o  Provider : Loretha Ash, MD  Contact : [phone]

## 2024-04-16 NOTE — Progress Notes (Signed)
 [facility]  [phone] [phone]     ID: [name] DOB: 1945-10-14  MR#: [id number]  RDW#:[id number]  Patient Care Team: Rollene Almarie LABOR, MD as PCP - General (Internal Medicine) Ethyl Lenis, MD as Consulting Physician (General Surgery) Dewey Rush, MD as Consulting Physician (Radiation Oncology) Sarrah Browning, MD as Consulting Physician (Obstetrics and Gynecology) Marleen Redell SAUNDERS, MD as Referring Physician (Specialist) Shelah Lamar RAMAN, MD as Consulting Physician (Pulmonary Disease) Alvaro Ricardo KATHEE Raddle., MD as Consulting Physician (Urology) Nilsa, Dempsey Pac, MD as Referring Physician (Ophthalmology) Lucila Rush LABOR, RPH-CPP (Pharmacist) Austin Olam CROME, MD as Consulting Physician (Ophthalmology) Loretha Ash, MD as Consulting Physician (Hematology and Oncology) Loretha Ash, MD as Consulting Physician (Hematology and Oncology)  CHIEF COMPLAINT: Estrogen receptor positive breast cancer  CURRENT TREATMENT: Letrozole , palbociclib   INTERVAL HISTORY:  History of Present Illness  [name] is a 78 year old female with metastatic breast cancer who presents with nausea, fatigue, and concerns about her current treatment regimen.  She experiences nausea and fatigue following her recent Faslodex injections. The nausea occurs after each injection, accompanied by episodes of vomiting and significant fatigue that impacts her daily activities. Mornings are particularly difficult, with symptoms persisting throughout the day, requiring frequent rest. Ginger drinks and pills provide some relief for the nausea.  Her metastatic breast cancer was initially diagnosed in 2019, with progression noted in the liver. Previous treatments included Ibrance  and letrozole , which were effective for several years. Recent imaging showed an increase in liver lesion size from 1.1 cm in March to 2.5 x 2.0 cm in September.  She is currently taking several  medications, including statin for cholesterol, a blood pressure medication, and pioglitazone  and metformin  for diabetes. She also takes eye drops and pills for her eyes due to edema caused by diabetes medications.  She is concerned about the long-term prognosis and the impact of her current treatment on her quality of life.  During the review of symptoms, she denies any new hair loss with Faslodex, but reports significant fatigue and nausea. She also mentions a pain in her right shoulder blade, which she attributes to liver involvement. She is not experiencing any significant infections despite low blood counts from Ibrance .  Rest of the pertinent 10 point ROS reviewed and negative.  We are continuing to follow her tumor marker: Lab Results  Component Value Date   CA2729 46.1 (H) 03/21/2024   CA2729 50.7 (H) 01/02/2024   CA2729 48.7 (H) 09/19/2023   CA2729 35.9 06/26/2023   CA2729 33.2 03/28/2023    REVIEW OF SYSTEMS:   A detailed review of systems today was otherwise stable.   COVID 19 VACCINATION STATUS: Status post Pfizer x2 followed by booster August 2021; had COVID November 2022   BREAST CANCER HISTORY: From the original intake note:  [name] had screening mammography showing some suspicious calcifications in the right breast leading to right diagnostic mammography with ultrasonography 01/22/2016 at [facility]. The breast density was category C. In the upper right breast there was a 2.3 cm mass with additional masses measuring 0.9 and 0.7 cm. There was also a possible additional 0.8 mass in the lower inner quadrant. Ultrasound confirmed an irregular hypoechoic mass in the right breast upper outer quadrant measuring 2.0 cm. There were other masses measuring 0.7 and 0.8 cm by ultrasonography. The right axilla was sonographically benign.  Biopsy of a 12:00 and 4:00 mass in the right breast 01/22/2016 showed ([id number]) both specimens showing invasive ductal  carcinoma, grade 1 or 2, both  95% estrogen receptor positive, both 95% progesterone receptor positive, both with strong staining intensity, with MIB-1 ranging from 10-15%, and both HER-2 negative, the signals ratio being 1.23-1.42, and the number per cell 1.85-2.59.  Her subsequent history is as detailed below   PAST MEDICAL HISTORY: Past Medical History:  Diagnosis Date   Breast cancer (HCC)    Cancer (HCC) 02/2016   right breast   DIABETES MELLITUS, TYPE II 01/04/2007   only takes actoplus daily   Dizziness and giddiness 02/29/2008   DVT, HX OF    at age 63 in right buttocks   Dyspnea    due to lung cancer   Family history of breast cancer    GERD 01/04/2007   pt reports resolved    GLAUCOMA 07/30/2008   both eyes   History of blood transfusion    no abnormal  reaction   History of uterine cancer 2000   hysterectomy done   HYPERLIPIDEMIA 01/04/2007   taking Pravastatin  daily   HYPERTENSION 01/04/2007   takes Lisinopril  daily   Joint pain    Joint swelling    Leg cramps    LEG PAIN, LEFT 07/06/2007   NUMBNESS 07/30/2008   in fingers;pt states from Diamox    OSTEOARTHRITIS, HIP 09/25/2009   OTITIS MEDIA, ACUTE, BILATERAL 02/29/2008   Overweight(278.02) 01/04/2007   Peripheral vascular disease    Personal history of radiation therapy 2018   Pneumonia    PONV (postoperative nausea and vomiting)    SLEEP APNEA, OBSTRUCTIVE    doesn't use a cpap;study done about 41yrs ago   TRANSIENT ISCHEMIC ATTACK, HX OF 01/04/2007   Vision loss    left eye    PAST SURGICAL HISTORY: Past Surgical History:  Procedure Laterality Date   ABDOMINAL HYSTERECTOMY  2000   BREAST LUMPECTOMY Right 01/13/2017   x2   BREAST LUMPECTOMY WITH RADIOACTIVE SEED AND SENTINEL LYMPH NODE BIOPSY Right 01/13/2017   Procedure: RIGHT BREAST RADIOACTIVE SEED X'S 2 GUIDED LUMPECTOMY WITH RADIOACTIVE SEED TARGETED AXILLARYLYMPH NODE EXCISION AND RIGHT AXILLARY SENTINEL LYMPH NODE BIOPSY;  Surgeon: Ethyl Lenis, MD;  Location: [facility] OR;  Service:  General;  Laterality: Right;  2 SEEDS IN RIGHT BREAST 1 SEED IN RIGHT AXILLARY NODE   CHOLECYSTECTOMY     ENDOBRONCHIAL ULTRASOUND Bilateral 03/28/2016   Procedure: ENDOBRONCHIAL ULTRASOUND;  Surgeon: Lamar GORMAN Chris, MD;  Location: [facility] ENDOSCOPY;  Service: Cardiopulmonary;  Laterality: Bilateral;   EYE SURGERY  13   shunt left and lazer eye surgery on right cataract and retenia tear with repair   growth removal  2004   from thumb   KNEE ARTHROSCOPY Right    mulitple eye surgeries     both eyes, cataracts with ioc done both eyes   OOPHORECTOMY     right lumpectomy with axillary node dissection Right 01/2017   TOTAL HIP ARTHROPLASTY  06/24/2011   Procedure: TOTAL HIP ARTHROPLASTY;  Surgeon: Dempsey JINNY Sensor;  Location: [facility] OR;  Service: Orthopedics;  Laterality: Right;   TOTAL HIP ARTHROPLASTY Left 11/12/2012   Dr Sensor   TOTAL HIP ARTHROPLASTY Left 11/12/2012   Procedure: TOTAL HIP ARTHROPLASTY;  Surgeon: Dempsey JINNY Sensor, MD;  Location: [facility] OR;  Service: Orthopedics;  Laterality: Left;  DEPUY PINNACLE    FAMILY HISTORY Family History  Problem Relation Age of Onset   Breast cancer Mother 28   Dementia Mother    Cancer Mother        Breast and lung cancer  Stroke Sister    Breast cancer Sister 28   Heart attack Maternal Aunt    Lung cancer Maternal Grandmother        non smoker   Glaucoma Maternal Grandfather    Anesthesia problems Neg Hx    Colon cancer Neg Hx    Esophageal cancer Neg Hx    Rectal cancer Neg Hx    Stomach cancer Neg Hx   The patient's father died at age 77, the patient's mother died at age 53. She had breast and lung cancers diagnosed shortly before her death. The patient had no brothers, 2 sisters. One sister was diagnosed with breast cancer at the age of 73.   GYNECOLOGIC HISTORY:  No LMP recorded. Patient has had a hysterectomy. Menarche age 68, first live birth age 67, the patient is GX P1. She had a hysterectomy for endometrial cancer in the year 2000. She did  not take hormone replacement. She did use oral contraceptives for more than 20 years remotely, with no complications.   SOCIAL HISTORY: (Updated July 2021). 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 She is home with her husband Debby. He is a retired curator.Their son Carlin also lives in [place].  The patient has 3 grandchildren aged 64, 39 and 72    ADVANCED DIRECTIVES: In place   HEALTH MAINTENANCE: Social History   Tobacco Use   Smoking status: Never   Smokeless tobacco: Never  Vaping Use   Vaping status: Never Used  Substance Use Topics   Alcohol use: No   Drug use: No     Colonoscopy: Never  PAP: Status post hysterectomy  Bone density: Remote   Allergies  Allergen Reactions   Codeine  Hives    Hycodan syrup   Fluorescein  Nausea And Vomiting    ? IV dye for retina specialist   Oxycodone  Nausea And Vomiting    Patient vomited for 3 days after taking   Lipitor [Atorvastatin  Calcium ] Other (See Comments)    Leg cramp   Sitagliptin Phosphate Nausea And Vomiting   Sulfa Drugs Cross Reactors Nausea And Vomiting    Current Outpatient Medications  Medication Sig Dispense Refill   acetaminophen  (TYLENOL ) 500 MG tablet Take 1,000 mg by mouth every 4 (four) hours as needed for moderate pain or fever. (Patient taking differently: Take 1,000 mg by mouth every 6 (six) hours as needed for moderate pain (pain score 4-6) or fever.)     aspirin  EC 81 MG tablet Take 81 mg by mouth daily at 6 PM. 1700     Biotin 1 MG CAPS Take by mouth.     brimonidine  (ALPHAGAN ) 0.2 % ophthalmic solution INSTILL 1 DROP INTO EACH EYE THREE TIMES DAILY     cholecalciferol (VITAMIN D) 1000 units tablet Take 1,000 Units by mouth daily.     dorzolamide-timolol  (COSOPT) 22.3-6.8 MG/ML ophthalmic solution Place 1 drop into both eyes 2 (two) times daily.     gabapentin  (NEURONTIN ) 100 MG capsule Take 200 mg at bedtime, can increase to  300 mg at bedtime if tolerated 90 capsule 3   ketorolac (ACULAR) 0.5 % ophthalmic solution Place 1 drop into the right eye 2 (two) times daily.     lisinopril  (ZESTRIL ) 5 MG tablet Take 1 tablet by mouth once daily 90 tablet 0   lovastatin  (MEVACOR ) 20 MG tablet TAKE 1 TABLET BY MOUTH AT BEDTIME 90 tablet 3   meloxicam  (MOBIC ) 15 MG  tablet Take 1 tablet (15 mg total) by mouth daily. Take with food 30 tablet 0   Netarsudil-Latanoprost (ROCKLATAN ) 0.02-0.005 % SOLN Apply to eye.     palbociclib  (IBRANCE ) 75 MG tablet Take 1 tablet (75 mg total) by mouth daily. Take for 21 days on, 7 days off, repeat every 28 days. 21 tablet 11   pantoprazole  (PROTONIX ) 40 MG tablet Take 1 tablet (40 mg total) by mouth daily. 30 tablet 11   pioglitazone -metformin  (ACTOPLUS MET ) 15-850 MG tablet Take 1 tablet by mouth daily. 90 tablet 3   prednisoLONE  acetate (PRED FORTE ) 1 % ophthalmic suspension SMARTSIG:1 In Eye(s) 6 Times Daily     No current facility-administered medications for this visit.     OBJECTIVE: white woman in no acute distress  Vitals:   04/16/24 1511  BP: (!) 188/67  Pulse: 98  Resp: 18  Temp: (!) 97.2 F (36.2 C)  SpO2: 98%     Wt Readings from Last 3 Encounters:  04/16/24 234 lb 12.8 oz (106.5 kg)  04/09/24 232 lb 3.2 oz (105.3 kg)  03/28/24 231 lb 4.8 oz (104.9 kg)   Body mass index is 40.3 kg/m.    ECOG FS:1 - Symptomatic but completely ambulatory  She is no acute distress  LAB RESULTS:  CMP     Component Value Date/Time   NA 141 04/16/2024 1431   NA 140 05/29/2017 1254   K 4.2 04/16/2024 1431   K 4.4 05/29/2017 1254   CL 107 04/16/2024 1431   CO2 28 04/16/2024 1431   CO2 25 05/29/2017 1254   GLUCOSE 136 (H) 04/16/2024 1431   GLUCOSE 154 (H) 05/29/2017 1254   BUN 16 04/16/2024 1431   BUN 11.5 05/29/2017 1254   CREATININE 0.77 04/16/2024 1431   CREATININE 0.8 05/29/2017 1254   CALCIUM  9.0 04/16/2024 1431   CALCIUM  9.3 05/29/2017 1254   PROT 6.6 04/16/2024  1431   PROT 7.0 05/29/2017 1254   ALBUMIN  4.0 04/16/2024 1431   ALBUMIN  4.1 05/29/2017 1254   AST 12 (L) 04/16/2024 1431   AST 13 05/29/2017 1254   ALT 12 04/16/2024 1431   ALT 14 05/29/2017 1254   ALKPHOS 53 04/16/2024 1431   ALKPHOS 66 05/29/2017 1254   BILITOT 0.4 04/16/2024 1431   BILITOT 0.50 05/29/2017 1254   GFRNONAA >60 04/16/2024 1431   GFRAA >60 03/10/2020 1138    INo results found for: SPEP, UPEP  Lab Results  Component Value Date   WBC 2.4 (L) 04/16/2024   NEUTROABS 1.3 (L) 04/16/2024   HGB 12.1 04/16/2024   HCT 34.9 (L) 04/16/2024   MCV 94.8 04/16/2024   PLT 182 04/16/2024      Chemistry      Component Value Date/Time   NA 141 04/16/2024 1431   NA 140 05/29/2017 1254   K 4.2 04/16/2024 1431   K 4.4 05/29/2017 1254   CL 107 04/16/2024 1431   CO2 28 04/16/2024 1431   CO2 25 05/29/2017 1254   BUN 16 04/16/2024 1431   BUN 11.5 05/29/2017 1254   CREATININE 0.77 04/16/2024 1431   CREATININE 0.8 05/29/2017 1254      Component Value Date/Time   CALCIUM  9.0 04/16/2024 1431   CALCIUM  9.3 05/29/2017 1254   ALKPHOS 53 04/16/2024 1431   ALKPHOS 66 05/29/2017 1254   AST 12 (L) 04/16/2024 1431   AST 13 05/29/2017 1254   ALT 12 04/16/2024 1431   ALT 14 05/29/2017 1254   BILITOT 0.4 04/16/2024 1431  BILITOT 0.50 05/29/2017 1254       No results found for: LABCA2  No components found for: [id number]  No results for input(s): INR in the last 168 hours.  Urinalysis    Component Value Date/Time   COLORURINE YELLOW 11/25/2022 1005   APPEARANCEUR CLEAR 11/25/2022 1005   LABSPEC 1.025 11/25/2022 1005   PHURINE 6.0 11/25/2022 1005   GLUCOSEU NEGATIVE 11/25/2022 1005   HGBUR NEGATIVE 11/25/2022 1005   BILIRUBINUR negative 04/09/2024 1121   KETONESUR negative 04/09/2024 1121   KETONESUR NEGATIVE 11/25/2022 1005   PROTEINUR 30 (A) 08/18/2017 1322   UROBILINOGEN 0.2 04/09/2024 1121   UROBILINOGEN 0.2 11/25/2022 1005   NITRITE Negative 04/09/2024  1121   NITRITE POSITIVE (A) 11/25/2022 1005   LEUKOCYTESUR Large (3+) (A) 04/09/2024 1121   LEUKOCYTESUR MODERATE (A) 11/25/2022 1005    STUDIES: No results found.     ELIGIBLE FOR AVAILABLE RESEARCH PROTOCOL: no  ASSESSMENT: 78 y.o. [place] woman with a remote history of early stage endometrial cancer, subsequently status post right breast upper outer quadrant biopsy 01/22/2016 for a clinically multifocal T2 N0, stage 2A invasive ductal carcinoma, grade 1, estrogen and progesterone receptor positive, HER-2 negative, with an MIB-1 between 10 and 15%.  (1) right axillary lymph node biopsy 03/02/2016 positive  (2) genetics testing 01/13/2016 through the Custom gene panel offered by [facility] found no deleterious mutations in  ATM, BARD1, BRCA1, BRCA2, BRIP1, CDH1, CHEK2, EPCAM, FANCC, MLH1, MSH2, MSH6, MUTYH, NBN, PALB2, PMS2, POLD1, PTEN, RAD51C, RAD51D, TP53, and XRCC2  METASTATIC DISEASE: OCT 2017 (3) CT scans of the chest abdomen and pelvis obtained 03/10/2016 are consistent with bilateral lung metastases and mediastinal and hilar nodal involvement, but no liver or bone spread  (a) bronchoscopic lymph node biopsy 2 (station 7, 13R) 03/28/2016 confirms metastatic adenocarcinoma, estrogen receptor positive, HER-2 not amplified  (b) baseline CA-27-29 on 04/18/2016 was 137.5.  (4) letrozole  started 03/15/2016, palbociclib  added 03/29/2016 at 125 mg/day, 21/7  (a) dose decreased to 100 mg per day, 21/7, beginning with February cycle  (b) palbociclib  held 01/31/2017, with increasing symptoms  (c) palbociclib  resumed October 2018 at 75 mg daily  (d) palbociclib  dose reduced to 75 mg every other day February through April 2019  (e) palbociclib  dose resumed at 75 mg daily as of 10/17/2017  (f) palbociclib  dose decreased to 75 mg every other day beginning 07/31/2019  (g) palbociclib  resumed at 75 mg daily, 21 days on 7 off, as of 08/25/2019  (5) status post double right lumpectomies  and right axillary lymph node sampling 01/13/2017 for 2 separate invasive ductal carcinoma lesions, pT1a and pT1b, N1a, with negative margins, both lesions being estrogen and progesterone receptor positive and HER-2 negative  (6) adjuvant radiation completed 07/05/2017 1. 50.4 Gy in 28 fractions to the right breast and supraclavicular region using whole-breast tangent fields. 2. Boost to the seroma delivered an additional 10 Gy in 5 fractions. The total dose was 60.4 Gy.  (7) restaging studies:  (a) CT scan of the chest and bone scan 06/23/2017 showed stable scattered very small lung nodules, no bone lesions  (b) CT of the chest 10/17/2017 showed no new or progressive metastatic disease in the chest. The small left lower lobe pulmonary nodule is stable  (c) PET scan on 03/02/2018: shows no findings for residual or recurrent right breast cancer  (d) chest CT scan stable, questionable right renal cyst noted  (e) chest CT 09/18/2020 shows no measurable disease  (f) to the CT scan  04/29/2021 shows no evidence of active disease; a 2 cm thyroid  nodule was noted   (8) right upper pole renal lesion noted to be enlarging on CT scan 06/22/2017  (a) no uptake on PET scan obtained 03/02/2018  #9 Most recent imaging with no new suspicious mass or lymphadenopathy identified in the chest abdomen or pelvis.  Stable chronic pleural and parenchymal scarring densities in the right lung apex.  Stable chronic moderate to severe biliary ductal dilatation.  Stable chronic 11 mm left adrenal gland nodule.  No dedicated follow-up required   PLAN Assessment & Plan  Assessment and Plan Metastatic hormone receptor-positive, HER2-negative breast cancer with liver involvement Transitioning to Faslodex due to resistance to Ibrance  and letrozole . 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  - Continue Faslodex injections. - Ordered liver biopsy to assess estrogen receptor status changes. - Monitor  liver lesion with CT scans for Faslodex response. - Consider next-line treatments if Faslodex is ineffective.  Cancer-related fatigue Fatigue likely due to cancer and treatment, impacting daily activities. - Monitor fatigue levels and adjust treatment as necessary.  Nausea and vomiting secondary to cancer therapy Nausea and vomiting likely due to Faslodex. Using ginger for relief. - Continue ginger drinks and pills for nausea relief. - Monitor symptoms and adjust treatment as necessary. - She doesn't want to take any medications for nausea.  Total time spent: 40 min  Thank you for consulting us  in the care of this patient.  Please not hesitate contact us  with any additional questions or concerns.  *Total Encounter Time as defined by the Centers for Medicare and Medicaid Services includes, in addition to the face-to-face time of a patient visit (documented in the note above) non-face-to-face time: obtaining and reviewing outside history, ordering and reviewing medications, tests or procedures, care coordination (communications with other health care professionals or caregivers) and documentation in the medical record.  Amber Stalls MD

## 2024-04-17 LAB — CANCER ANTIGEN 27.29: CA 27.29: 53.1 U/mL — ABNORMAL HIGH (ref 0.0–38.6)

## 2024-04-22 ENCOUNTER — Telehealth: Payer: Self-pay

## 2024-04-22 NOTE — Telephone Encounter (Signed)
 S/w regarding appointment confirmation and medication management.  Patient having issues with check-in process on myChart and calling to confirm upcoming injection appointment on Thursday, 11/13. Appointment confirmed with patient.  Patient also wanted to note that she has stopped taking protonix  d/t concerns of side effects. Dr. Loretha made aware. Patient encouraged to reach out GI office to inform them of the change. Confirmed that patient had office number.  Patient verbalized an understanding of the information.

## 2024-04-24 ENCOUNTER — Telehealth: Payer: Self-pay

## 2024-04-24 ENCOUNTER — Other Ambulatory Visit: Payer: Self-pay

## 2024-04-24 MED ORDER — ONDANSETRON 4 MG PO TBDP: 4.0000 mg | ORAL_TABLET | Freq: Three times a day (TID) | ORAL | 3 refills | Status: AC | PRN

## 2024-04-24 NOTE — Telephone Encounter (Signed)
 Pt called and states there is a problem processing her Zofran  rx. She states it needs PA.  Pt verbalized concerns about head ache side effect that Zofran  can possibly cause. She is also unsure how to take the drug since it is ODT but says to take PO. Advised pt it will disintegrate in her mouth. She can either let it on her tongue or sublingual. She is agreeable.   We discussed the possibility of Compazine  as opposed to Zofran  and she prefers to try Zofran  first and if it is not helpful she will take Compazine . MD gave verbal order for Compazine  10 mg 1 TAB PO Q6H PRN for nausea/vomiting with 1 refill. Order not yet placed as pt would like to try Zofran  first.  She knows to call for any other concerns.

## 2024-04-24 NOTE — Progress Notes (Signed)
 Pt called and c/o nausea and vomiting. Per pt's last visit with Dr Loretha, she has changed her mind about the role of PO antiemetics and is now interested in pursuing intervention.   Per MD, order was placed for Zofran  4 mg PO ODT q8h prn. She was advised how to take it and educated on possible side effects.   She is disappointed that she may have more symptoms but understands the role of medication.   She knows to call with any further questions or concerns.

## 2024-04-25 ENCOUNTER — Inpatient Hospital Stay

## 2024-04-25 ENCOUNTER — Inpatient Hospital Stay: Admitting: Hematology and Oncology

## 2024-04-25 ENCOUNTER — Other Ambulatory Visit (HOSPITAL_COMMUNITY): Payer: Self-pay

## 2024-04-25 ENCOUNTER — Telehealth: Payer: Self-pay

## 2024-04-25 VITALS — BP 219/88 | HR 75 | Temp 98.8°F | Resp 15

## 2024-04-25 DIAGNOSIS — C7800 Secondary malignant neoplasm of unspecified lung: Secondary | ICD-10-CM

## 2024-04-25 DIAGNOSIS — Z5111 Encounter for antineoplastic chemotherapy: Secondary | ICD-10-CM | POA: Diagnosis not present

## 2024-04-25 MED ORDER — FULVESTRANT 250 MG/5ML IM SOSY
500.0000 mg | PREFILLED_SYRINGE | Freq: Once | INTRAMUSCULAR | Status: AC
  Administered 2024-04-25: 500 mg via INTRAMUSCULAR
  Filled 2024-04-25: qty 10

## 2024-04-25 MED ORDER — FULVESTRANT 250 MG/5ML IM SOSY: 500.0000 mg | PREFILLED_SYRINGE | Freq: Once | INTRAMUSCULAR | Status: DC

## 2024-04-25 NOTE — Progress Notes (Signed)
 Pt presented with high BP of 219/88 pt is asymptomatic. Notified LPN Porsche C and PA Kaitlyn W about BP. Advised pt if she develops headache or chest pain she should go to the ED per PA Kaitlyn W.

## 2024-04-25 NOTE — Telephone Encounter (Signed)
 Oral Oncology Patient Advocate Encounter   Received notification that prior authorization for ondansetron  ZOFRAN -ODT is required.   PA submitted on 04/25/24 REQ ID [id number] via phone to [employer]  Supporting documents faxed to [phone] Status is pending, approval could take up to 7 days per insurance      Charlott Hamilton,  CPhT-Adv  she/her/hers [facility]  [facility] Pharmacy Technician Patient Advocate Specialist III [facility] Phone: [phone]  Fax: [phone] [email or web address]

## 2024-04-29 ENCOUNTER — Ambulatory Visit: Payer: Self-pay

## 2024-04-29 ENCOUNTER — Encounter: Payer: Self-pay | Admitting: Family Medicine

## 2024-04-29 ENCOUNTER — Ambulatory Visit (INDEPENDENT_AMBULATORY_CARE_PROVIDER_SITE_OTHER): Admitting: Family Medicine

## 2024-04-29 ENCOUNTER — Telehealth: Payer: Self-pay

## 2024-04-29 VITALS — BP 138/72 | HR 72 | Temp 97.9°F | Resp 20 | Ht 64.0 in | Wt 230.0 lb

## 2024-04-29 DIAGNOSIS — G8929 Other chronic pain: Secondary | ICD-10-CM | POA: Diagnosis not present

## 2024-04-29 DIAGNOSIS — M545 Low back pain, unspecified: Secondary | ICD-10-CM

## 2024-04-29 DIAGNOSIS — N3000 Acute cystitis without hematuria: Secondary | ICD-10-CM | POA: Diagnosis not present

## 2024-04-29 LAB — POCT URINALYSIS DIPSTICK
Bilirubin, UA: NEGATIVE
Blood, UA: NEGATIVE
Glucose, UA: NEGATIVE
Ketones, UA: NEGATIVE
Nitrite, UA: POSITIVE
Protein, UA: NEGATIVE
Spec Grav, UA: 1.015 (ref 1.010–1.025)
Urobilinogen, UA: NEGATIVE U/dL — AB
pH, UA: 5 (ref 5.0–8.0)

## 2024-04-29 MED ORDER — BACLOFEN 5 MG PO TABS: 2.5000 mg | ORAL_TABLET | Freq: Two times a day (BID) | ORAL | 0 refills | Status: DC | PRN

## 2024-04-29 MED ORDER — CEFDINIR 300 MG PO CAPS: 300.0000 mg | ORAL_CAPSULE | Freq: Two times a day (BID) | ORAL | 0 refills | Status: AC

## 2024-04-29 NOTE — Assessment & Plan Note (Addendum)
 Education provided on UTIs. Encouraged adequate hydration.  Results for orders placed or performed in visit on 04/29/24  POCT urinalysis dipstick  Result Value Ref Range   Color, UA pale yellow    Clarity, UA cloudy    Glucose, UA Negative Negative   Bilirubin, UA neg    Ketones, UA neg    Spec Grav, UA 1.015 1.010 - 1.025   Blood, UA neg    pH, UA 5.0 5.0 - 8.0   Protein, UA Negative Negative   Urobilinogen, UA negative (A) 0.2 or 1.0 E.U./dL   Nitrite, UA pos    Leukocytes, UA Moderate (2+) (A) Negative   Appearance     Odor     Orders:   POCT urinalysis dipstick   Urine Culture; Future   cefdinir (OMNICEF) 300 MG capsule; Take 1 capsule (300 mg total) by mouth 2 (two) times daily for 7 days.

## 2024-04-29 NOTE — Telephone Encounter (Signed)
 FYI Only or Action Required?: FYI only for provider: appointment scheduled on this afternoon.  Patient was last seen in primary care on 04/09/2024 by Alvia Corean CROME, FNP.  Called Nurse Triage reporting Dysuria.  Symptoms began several days ago.  Interventions attempted: Nothing.  Symptoms are: gradually worsening.  Triage Disposition: See HCP Within 4 Hours (Or PCP Triage)  Patient/caregiver understands and will follow disposition?: Yes                 Pt disconnected prior to transfer - Will call back.    Copied from CRM #[id number]. Topic: Clinical - Red Word Triage >> Apr 29, 2024  9:03 AM Victoria A wrote: [facility] that prompted transfer to Nurse Triage: Patient thinks she has UTI again- has burning and pain Reason for Disposition  Side (flank) or lower back pain present  Answer Assessment - Initial Assessment Questions 1. SYMPTOM: What's the main symptom you're concerned about? (e.g., frequency, incontinence)     Burning - cannot hold urine, Frequency, back pain 2. ONSET: When did the  s/s  start?     Last week 3. PAIN: Is there any pain? If Yes, ask: How bad is it? (Scale: 1-10; mild, moderate, severe)     yes 4. CAUSE: What do you think is causing the symptoms?     UTI 5. OTHER SYMPTOMS: Do you have any other symptoms? (e.g., blood in urine, fever, flank pain, pain with urination)     Back pain, cannot hold urine, burning and frequency  Answer Assessment - Initial Assessment Questions 1. SEVERITY: How bad is the pain?  (e.g., Scale 1-10; mild, moderate, or severe)     Moderate - severe - burning  4. ONSET: When did the painful urination start?      Weds last week  6. PAST UTI: Have you had a urine infection before? If Yes, ask: When was the last time? and What happened that time?      Yes - about 1 month ago 7. CAUSE: What do you think is causing the painful urination?  (e.g., UTI, scratch, Herpes sore)     UTI 8.  OTHER SYMPTOMS: Do you have any other symptoms? (e.g., blood in urine, flank pain, genital sores, urgency, vaginal discharge)     Back pain, cannot hold urine  - frequency  Protocols used: Urinary Symptoms-A-AH, Urination Pain - Female-A-AH

## 2024-04-29 NOTE — Telephone Encounter (Signed)
 Pt seen in office today.   Copied from CRM #[id number]. Topic: Clinical - Red Word Triage >> Apr 29, 2024  9:38 AM Charolett L wrote: [facility] that prompted transfer to Nurse Triage: Severe UTI, Burning and unable to hold urine

## 2024-04-29 NOTE — Progress Notes (Signed)
 Assessment & Plan Acute cystitis without hematuria Education provided on UTIs. Encouraged adequate hydration.  Results for orders placed or performed in visit on 04/29/24  POCT urinalysis dipstick  Result Value Ref Range   Color, UA pale yellow    Clarity, UA cloudy    Glucose, UA Negative Negative   Bilirubin, UA neg    Ketones, UA neg    Spec Grav, UA 1.015 1.010 - 1.025   Blood, UA neg    pH, UA 5.0 5.0 - 8.0   Protein, UA Negative Negative   Urobilinogen, UA negative (A) 0.2 or 1.0 E.U./dL   Nitrite, UA pos    Leukocytes, UA Moderate (2+) (A) Negative   Appearance     Odor     Orders:   POCT urinalysis dipstick   Urine Culture; Future   cefdinir (OMNICEF) 300 MG capsule; Take 1 capsule (300 mg total) by mouth 2 (two) times daily for 7 days.  Chronic right-sided low back pain, unspecified whether sciatica present  Orders:   Baclofen  5 MG TABS; Take 0.5-1 tablets (2.5-5 mg total) by mouth 2 (two) times daily as needed.   Follow up plan: Return if symptoms worsen or fail to improve.  Niki Rung, MSN, APRN, FNP-C  Subjective:  HPI: [name] is a 78 y.o. female presenting on 04/29/2024 for Urinary Tract Infection (Urgency, leakage, burning, frequent urination /Started a few days ago - leakage for last 2 weeks )  Discussed the use of AI scribe software for clinical note transcription with the patient, who gave verbal consent to proceed.  She is accompanied by her husband, who she is okay with being present.  She has been experiencing urinary symptoms such as leakage, burning, frequency, and urgency for the past two weeks. She was previously seen at the end of October and was given Macrobid , which provided relief for about one to two weeks before symptoms returned. She has a history of frequent urinary tract infections in her twenties but has not had one in years until recently experiencing two infections.  She has a history of back pain, particularly in the  lower back, which has been ongoing for a year. An x-ray performed last April showed no acute abnormalities, but a CT scan revealed a disc issue. She has been doing exercises for it, but the pain persists. She recalls a previous prescription of baclofen  from urgent care that was effective in managing her back pain.  She discusses her blood pressure, noting that it was high during a visit to the cancer center but normal at home and during today's visit. Her blood pressure readings at the cancer center are consistently high, regardless of the machine used.       ROS: Negative unless specifically indicated above in HPI.   Relevant past medical history reviewed and updated as indicated.   Allergies and medications reviewed and updated.   Current Outpatient Medications:    acetaminophen  (TYLENOL ) 500 MG tablet, Take 1,000 mg by mouth every 4 (four) hours as needed for moderate pain or fever. (Patient taking differently: Take 1,000 mg by mouth every 6 (six) hours as needed for moderate pain (pain score 4-6) or fever.), Disp: , Rfl:    aspirin  EC 81 MG tablet, Take 81 mg by mouth daily at 6 PM. 1700, Disp: , Rfl:    Baclofen  5 MG TABS, Take 0.5-1 tablets (2.5-5 mg total) by mouth 2 (two) times daily as needed., Disp: 60 tablet, Rfl: 0   Biotin  1 MG CAPS, Take by mouth., Disp: , Rfl:    brimonidine  (ALPHAGAN ) 0.2 % ophthalmic solution, INSTILL 1 DROP INTO EACH EYE THREE TIMES DAILY, Disp: , Rfl:    cefdinir (OMNICEF) 300 MG capsule, Take 1 capsule (300 mg total) by mouth 2 (two) times daily for 7 days., Disp: 14 capsule, Rfl: 0   cholecalciferol (VITAMIN D) 1000 units tablet, Take 1,000 Units by mouth daily., Disp: , Rfl:    dorzolamide-timolol  (COSOPT) 22.3-6.8 MG/ML ophthalmic solution, Place 1 drop into both eyes 2 (two) times daily., Disp: , Rfl:    gabapentin  (NEURONTIN ) 100 MG capsule, Take 200 mg at bedtime, can increase to 300 mg at bedtime if tolerated, Disp: 90 capsule, Rfl: 3   ketorolac  (ACULAR) 0.5 % ophthalmic solution, Place 1 drop into the right eye 2 (two) times daily., Disp: , Rfl:    lisinopril  (ZESTRIL ) 5 MG tablet, Take 1 tablet by mouth once daily, Disp: 90 tablet, Rfl: 0   lovastatin  (MEVACOR ) 20 MG tablet, TAKE 1 TABLET BY MOUTH AT BEDTIME, Disp: 90 tablet, Rfl: 3   meloxicam  (MOBIC ) 15 MG tablet, Take 1 tablet (15 mg total) by mouth daily. Take with food, Disp: 30 tablet, Rfl: 0   Netarsudil-Latanoprost (ROCKLATAN ) 0.02-0.005 % SOLN, Apply to eye., Disp: , Rfl:    ondansetron  (ZOFRAN -ODT) 4 MG disintegrating tablet, Take 1 tablet (4 mg total) by mouth every 8 (eight) hours as needed for nausea or vomiting., Disp: 20 tablet, Rfl: 3   palbociclib  (IBRANCE ) 75 MG tablet, Take 1 tablet (75 mg total) by mouth daily. Take for 21 days on, 7 days off, repeat every 28 days., Disp: 21 tablet, Rfl: 11   pantoprazole  (PROTONIX ) 40 MG tablet, Take 1 tablet (40 mg total) by mouth daily., Disp: 30 tablet, Rfl: 11   pioglitazone -metformin  (ACTOPLUS MET ) 15-850 MG tablet, Take 1 tablet by mouth daily., Disp: 90 tablet, Rfl: 3   prednisoLONE  acetate (PRED FORTE ) 1 % ophthalmic suspension, SMARTSIG:1 In Eye(s) 6 Times Daily, Disp: , Rfl:   Allergies  Allergen Reactions   Codeine  Hives    Hycodan syrup   Fluorescein  Nausea And Vomiting    ? IV dye for retina specialist   Oxycodone  Nausea And Vomiting    Patient vomited for 3 days after taking   Lipitor [Atorvastatin  Calcium ] Other (See Comments)    Leg cramp   Sitagliptin Phosphate Nausea And Vomiting   Sulfa Drugs Cross Reactors Nausea And Vomiting    Objective:   BP 138/72   Pulse 72   Temp 97.9 F (36.6 C)   Resp 20   Ht 5' 4 (1.626 m)   Wt 230 lb (104.3 kg)   BMI 39.48 kg/m    Physical Exam Vitals reviewed.  Constitutional:      General: She is not in acute distress.    Appearance: Normal appearance. She is not ill-appearing, toxic-appearing or diaphoretic.  HENT:     Head: Normocephalic and atraumatic.   Eyes:     General: No scleral icterus.       Right eye: No discharge.        Left eye: No discharge.     Conjunctiva/sclera: Conjunctivae normal.  Cardiovascular:     Rate and Rhythm: Normal rate.  Pulmonary:     Effort: Pulmonary effort is normal. No respiratory distress.  Abdominal:     Tenderness: There is no right CVA tenderness or left CVA tenderness.  Musculoskeletal:        General: Normal  range of motion.     Cervical back: Normal range of motion.  Skin:    General: Skin is warm and dry.     Capillary Refill: Capillary refill takes less than 2 seconds.  Neurological:     General: No focal deficit present.     Mental Status: She is alert and oriented to person, place, and time. Mental status is at baseline.  Psychiatric:        Mood and Affect: Mood normal.        Behavior: Behavior normal.        Thought Content: Thought content normal.        Judgment: Judgment normal.

## 2024-04-30 NOTE — Telephone Encounter (Signed)
 She was already scheduled and seen for this. No action needed.

## 2024-05-01 ENCOUNTER — Other Ambulatory Visit (HOSPITAL_COMMUNITY): Payer: Self-pay

## 2024-05-01 NOTE — Telephone Encounter (Signed)
 Oral Oncology Patient Advocate Encounter  Prior Authorization for ondansetron  ODT has been approved.    PA# REQ ID [id number] via phone to [employer]  Effective dates: 05/01/24 through 06/12/24  Patient has been notified via MyChart   Charlott Hamilton,  CPhT-Adv  she/her/hers [facility]  [facility] Pharmacy Technician Patient Advocate Specialist III [facility] Phone: [phone]  Fax: [phone] [email or web address]

## 2024-05-02 ENCOUNTER — Ambulatory Visit (INDEPENDENT_AMBULATORY_CARE_PROVIDER_SITE_OTHER)

## 2024-05-02 VITALS — Ht 64.0 in | Wt 230.0 lb

## 2024-05-02 DIAGNOSIS — Z0000 Encounter for general adult medical examination without abnormal findings: Secondary | ICD-10-CM | POA: Diagnosis not present

## 2024-05-02 LAB — URINE CULTURE

## 2024-05-02 NOTE — Progress Notes (Signed)
 Chief Complaint  Patient presents with   Medicare Wellness     Subjective:   [name] is a 78 y.o. female who presents for a Medicare Annual Wellness Visit.  Allergies (verified) Codeine , Fluorescein , Oxycodone , Lipitor [atorvastatin  calcium ], Sitagliptin phosphate, and Sulfa drugs cross reactors   History: Past Medical History:  Diagnosis Date   Breast cancer (HCC)    Cancer (HCC) 02/2016   right breast   DIABETES MELLITUS, TYPE II 01/04/2007   only takes actoplus daily   Dizziness and giddiness 02/29/2008   DVT, HX OF    at age 37 in right buttocks   Dyspnea    due to lung cancer   Family history of breast cancer    GERD 01/04/2007   pt reports resolved    GLAUCOMA 07/30/2008   both eyes   History of blood transfusion    no abnormal  reaction   History of uterine cancer 2000   hysterectomy done   HYPERLIPIDEMIA 01/04/2007   taking Pravastatin  daily   HYPERTENSION 01/04/2007   takes Lisinopril  daily   Joint pain    Joint swelling    Leg cramps    LEG PAIN, LEFT 07/06/2007   NUMBNESS 07/30/2008   in fingers;pt states from Diamox    OSTEOARTHRITIS, HIP 09/25/2009   OTITIS MEDIA, ACUTE, BILATERAL 02/29/2008   Overweight(278.02) 01/04/2007   Peripheral vascular disease    Personal history of radiation therapy 2018   Pneumonia    PONV (postoperative nausea and vomiting)    SLEEP APNEA, OBSTRUCTIVE    doesn't use a cpap;study done about 25yrs ago   TRANSIENT ISCHEMIC ATTACK, HX OF 01/04/2007   Vision loss    left eye   Past Surgical History:  Procedure Laterality Date   ABDOMINAL HYSTERECTOMY  2000   BREAST LUMPECTOMY Right 01/13/2017   x2   BREAST LUMPECTOMY WITH RADIOACTIVE SEED AND SENTINEL LYMPH NODE BIOPSY Right 01/13/2017   Procedure: RIGHT BREAST RADIOACTIVE SEED X'S 2 GUIDED LUMPECTOMY WITH RADIOACTIVE SEED TARGETED AXILLARYLYMPH NODE EXCISION AND RIGHT AXILLARY SENTINEL LYMPH NODE BIOPSY;  Surgeon: Ethyl Lenis, MD;  Location: [facility] OR;  Service: General;   Laterality: Right;  2 SEEDS IN RIGHT BREAST 1 SEED IN RIGHT AXILLARY NODE   CHOLECYSTECTOMY     ENDOBRONCHIAL ULTRASOUND Bilateral 03/28/2016   Procedure: ENDOBRONCHIAL ULTRASOUND;  Surgeon: Lamar GORMAN Chris, MD;  Location: [facility] ENDOSCOPY;  Service: Cardiopulmonary;  Laterality: Bilateral;   EYE SURGERY  13   shunt left and lazer eye surgery on right cataract and retenia tear with repair   growth removal  2004   from thumb   KNEE ARTHROSCOPY Right    mulitple eye surgeries     both eyes, cataracts with ioc done both eyes   OOPHORECTOMY     right lumpectomy with axillary node dissection Right 01/2017   TOTAL HIP ARTHROPLASTY  06/24/2011   Procedure: TOTAL HIP ARTHROPLASTY;  Surgeon: Dempsey JINNY Sensor;  Location: [facility] OR;  Service: Orthopedics;  Laterality: Right;   TOTAL HIP ARTHROPLASTY Left 11/12/2012   Dr Sensor   TOTAL HIP ARTHROPLASTY Left 11/12/2012   Procedure: TOTAL HIP ARTHROPLASTY;  Surgeon: Dempsey JINNY Sensor, MD;  Location: [facility] OR;  Service: Orthopedics;  Laterality: Left;  DEPUY PINNACLE   Family History  Problem Relation Age of Onset   Breast cancer Mother 37   Dementia Mother    Cancer Mother        Breast and lung cancer   Stroke Sister    Breast  cancer Sister 70   Heart attack Maternal Aunt    Lung cancer Maternal Grandmother        non smoker   Glaucoma Maternal Grandfather    Anesthesia problems Neg Hx    Colon cancer Neg Hx    Esophageal cancer Neg Hx    Rectal cancer Neg Hx    Stomach cancer Neg Hx    Social History   Occupational History   Occupation: physiological scientist    Employer: [employer]   Occupation: retired  Tobacco Use   Smoking status: Never   Smokeless tobacco: Never  Vaping Use   Vaping status: Never Used  Substance and Sexual Activity   Alcohol use: No   Drug use: No   Sexual activity: Not Currently   Tobacco Counseling Counseling given: Not Answered  SDOH Screenings   Food Insecurity: No Food Insecurity (02/06/2023)  Housing:  Low Risk  (02/06/2023)  Transportation Needs: No Transportation Needs (02/06/2023)  Utilities: Not At Risk (02/06/2023)  Alcohol Screen: Low Risk  (02/25/2022)  Depression (PHQ2-9): Low Risk  (04/29/2024)  Financial Resource Strain: Low Risk  (02/06/2023)  Physical Activity: Sufficiently Active (02/06/2023)  Social Connections: Socially Integrated (02/06/2023)  Stress: No Stress Concern Present (02/06/2023)  Tobacco Use: Low Risk  (05/02/2024)  Health Literacy: Adequate Health Literacy (02/06/2023)   See flowsheets for full screening details  Depression Screen PHQ 2 & 9 Depression Scale- Over the past 2 weeks, how often have you been bothered by any of the following problems? Little interest or pleasure in doing things: 0 Feeling down, depressed, or hopeless (PHQ Adolescent also includes...irritable): 1 PHQ-2 Total Score: 1     Goals Addressed   None    Visit info / Clinical Intake: Medicare Wellness Visit Type:: Subsequent Annual Wellness Visit Persons participating in visit:: patient Medicare Wellness Visit Mode:: Telephone If telephone:: video declined Because this visit was a virtual/telehealth visit:: vitals recorded from last visit If Telephone or Video please confirm:: I connected with the patient using audio enabled telemedicine application and verified that I am speaking with the correct person using two identifiers; I discussed the limitations of evaluation and management by telemedicine; The patient expressed understanding and agreed to proceed Patient Location:: Home Provider Location:: Home Information given by:: patient Interpreter Needed?: No Pre-visit prep was completed: no AWV questionnaire completed by patient prior to visit?: no Living arrangements:: lives with spouse/significant other Patient's Overall Health Status Rating: (!) fair Typical amount of pain: some (Rt hip pain) Does pain affect daily life?: (!) yes (walking/sitting and standing) Are you currently  prescribed opioids?: no  Dietary Habits and Nutritional Risks How many meals a day?: 3 Eats fruit and vegetables daily?: yes Most meals are obtained by: eating out; preparing own meals In the last 2 weeks, have you had any of the following?: none Diabetic:: (!) yes Any non-healing wounds?: no How often do you check your BS?: as needed Would you like to be referred to a Nutritionist or for Diabetic Management? : no  Functional Status Activities of Daily Living (to include ambulation/medication): Independent Ambulation: Independent Medication Administration: Independent Home Management: Independent Manage your own finances?: yes Primary transportation is: family/friends (husband drives her) Concerns about vision?: (!) yes Concerns about hearing?: no  Fall Screening Falls in the past year?: 0 Number of falls in past year: 0 Was there an injury with Fall?: 0 Fall Risk Category Calculator: 0 Patient Fall Risk Level: Low Fall Risk  Fall Risk Patient at Risk for  Falls Due to: No Fall Risks Fall risk Follow up: Falls evaluation completed; Falls prevention discussed  Home and Transportation Safety: All stairs or steps have railings?: yes  Advance Directives (For Healthcare) Does Patient Have a Medical Advance Directive?: Yes Type of Advance Directive: Living will Copy of Healthcare Power of Attorney in Chart?: No - copy requested Copy of Living Will in Chart?: No - copy requested        Objective:    Today's Vitals   05/02/24 1600  Weight: 230 lb (104.3 kg)  Height: 5' 4 (1.626 m)   Body mass index is 39.48 kg/m.  Current Medications (verified) Outpatient Encounter Medications as of 05/02/2024  Medication Sig   acetaminophen  (TYLENOL ) 500 MG tablet Take 1,000 mg by mouth every 4 (four) hours as needed for moderate pain or fever. (Patient taking differently: Take 1,000 mg by mouth every 6 (six) hours as needed for moderate pain (pain score 4-6) or fever.)   aspirin   EC 81 MG tablet Take 81 mg by mouth daily at 6 PM. 1700   Baclofen  5 MG TABS Take 0.5-1 tablets (2.5-5 mg total) by mouth 2 (two) times daily as needed.   Biotin 1 MG CAPS Take by mouth.   brimonidine  (ALPHAGAN ) 0.2 % ophthalmic solution INSTILL 1 DROP INTO EACH EYE THREE TIMES DAILY   cefdinir  (OMNICEF ) 300 MG capsule Take 1 capsule (300 mg total) by mouth 2 (two) times daily for 7 days.   cholecalciferol (VITAMIN D) 1000 units tablet Take 1,000 Units by mouth daily.   dorzolamide-timolol  (COSOPT) 22.3-6.8 MG/ML ophthalmic solution Place 1 drop into both eyes 2 (two) times daily.   gabapentin  (NEURONTIN ) 100 MG capsule Take 200 mg at bedtime, can increase to 300 mg at bedtime if tolerated   ketorolac (ACULAR) 0.5 % ophthalmic solution Place 1 drop into the right eye 2 (two) times daily.   lisinopril  (ZESTRIL ) 5 MG tablet Take 1 tablet by mouth once daily   lovastatin  (MEVACOR ) 20 MG tablet TAKE 1 TABLET BY MOUTH AT BEDTIME   meloxicam  (MOBIC ) 15 MG tablet Take 1 tablet (15 mg total) by mouth daily. Take with food   Netarsudil-Latanoprost (ROCKLATAN ) 0.02-0.005 % SOLN Apply to eye.   ondansetron  (ZOFRAN -ODT) 4 MG disintegrating tablet Take 1 tablet (4 mg total) by mouth every 8 (eight) hours as needed for nausea or vomiting.   palbociclib  (IBRANCE ) 75 MG tablet Take 1 tablet (75 mg total) by mouth daily. Take for 21 days on, 7 days off, repeat every 28 days.   pantoprazole  (PROTONIX ) 40 MG tablet Take 1 tablet (40 mg total) by mouth daily.   pioglitazone -metformin  (ACTOPLUS MET ) 15-850 MG tablet Take 1 tablet by mouth daily.   prednisoLONE  acetate (PRED FORTE ) 1 % ophthalmic suspension SMARTSIG:1 In Eye(s) 6 Times Daily   No facility-administered encounter medications on file as of 05/02/2024.   Hearing/Vision screen No results found. Immunizations and Health Maintenance Health Maintenance  Topic Date Due   Hepatitis C Screening  Never done   DTaP/Tdap/Td (1 - Tdap) Never done   Zoster  Vaccines- Shingrix (1 of 2) Never done   Bone Density Scan  Never done   FOOT EXAM  08/31/2023   Medicare Annual Wellness (AWV)  02/06/2024   COVID-19 Vaccine (7 - 2025-26 season) 02/12/2024   OPHTHALMOLOGY EXAM  07/19/2024   Diabetic kidney evaluation - Urine ACR  08/24/2024   HEMOGLOBIN A1C  10/08/2024   Mammogram  10/24/2024   Diabetic kidney evaluation - eGFR measurement  04/16/2025   Influenza Vaccine  Completed   Meningococcal B Vaccine  Aged Out   Pneumococcal Vaccine: 50+ Years  Discontinued        Assessment/Plan:  This is a routine wellness examination for [name].  Patient Care Team: Rollene Almarie LABOR, MD as PCP - General (Internal Medicine) Ethyl Lenis, MD as Consulting Physician (General Surgery) Dewey Rush, MD as Consulting Physician (Radiation Oncology) Sarrah Browning, MD as Consulting Physician (Obstetrics and Gynecology) Marleen Redell SAUNDERS, MD as Referring Physician (Specialist) Shelah Lamar RAMAN, MD as Consulting Physician (Pulmonary Disease) Alvaro Ricardo KATHEE Raddle., MD as Consulting Physician (Urology) Nilsa, Dempsey Pac, MD as Referring Physician (Ophthalmology) Lucila Rush LABOR, RPH-CPP (Pharmacist) Austin Olam CROME, MD as Consulting Physician (Ophthalmology) Loretha Ash, MD as Consulting Physician (Hematology and Oncology) Loretha Ash, MD as Consulting Physician (Hematology and Oncology)  I have personally reviewed and noted the following in the patient's chart:   Medical and social history Use of alcohol, tobacco or illicit drugs  Current medications and supplements including opioid prescriptions. Functional ability and status Nutritional status Physical activity Advanced directives List of other physicians Hospitalizations, surgeries, and ER visits in previous 12 months Vitals Screenings to include cognitive, depression, and falls Referrals and appointments  No orders of the defined types were placed in this encounter.  In addition, I  have reviewed and discussed with patient certain preventive protocols, quality metrics, and best practice recommendations. A written personalized care plan for preventive services as well as general preventive health recommendations were provided to patient.   Keilan Nichol L Angeliz Settlemyre, CMA   05/02/2024   No follow-ups on file.  After Visit Summary: (MyChart) Due to this being a telephonic visit, the after visit summary with patients personalized plan was offered to patient via MyChart   Nurse Notes: Patient declines all due vaccines.  She is due for a Hep C screening nad a foot exam, which can be done during her next office visit.  Patient had no other concerns to address today.  Patient stated that her cancer doctor normally orders her DEXA's.

## 2024-05-02 NOTE — Patient Instructions (Addendum)
 [name],  Thank you for taking the time for your Medicare Wellness Visit. I appreciate your continued commitment to your health goals. Please review the care plan we discussed, and feel free to reach out if I can assist you further.  Please note that Annual Wellness Visits do not include a physical exam. Some assessments may be limited, especially if the visit was conducted virtually. If needed, we may recommend an in-person follow-up with your provider.  Ongoing Care Seeing your primary care provider every 3 to 6 months helps us  monitor your health and provide consistent, personalized care. Next office visit on 09/23/2024.  You are due for a Hep C screening and a foot exam.  These can be done during your next office visit.  Remember to inform your cancer provider that you are due for a bone density screening.  Each day, aim for 6 glasses of water, plenty of protein in your diet and try to get up and walk/ stretch every hour for 5-10 minutes at a time.    Referrals If a referral was made during today's visit and you haven't received any updates within two weeks, please contact the referred provider directly to check on the status.  Recommended Screenings:  Health Maintenance  Topic Date Due   Hepatitis C Screening  Never done   DTaP/Tdap/Td vaccine (1 - Tdap) Never done   Zoster (Shingles) Vaccine (1 of 2) Never done   Osteoporosis screening with Bone Density Scan  Never done   Complete foot exam   08/31/2023   COVID-19 Vaccine (7 - 2025-26 season) 02/12/2024   Eye exam for diabetics  07/19/2024   Yearly kidney health urinalysis for diabetes  08/24/2024   Hemoglobin A1C  10/08/2024   Breast Cancer Screening  10/24/2024   Yearly kidney function blood test for diabetes  04/16/2025   Medicare Annual Wellness Visit  05/02/2025   Flu Shot  Completed   Meningitis B Vaccine  Aged Out   Pneumococcal Vaccine for age over 46  Discontinued       05/02/2024    4:03 PM  Advanced Directives   Does Patient Have a Medical Advance Directive? Yes  Type of Estate Agent of [place];Living will    Vision: Annual vision screenings are recommended for early detection of glaucoma, cataracts, and diabetic retinopathy. These exams can also reveal signs of chronic conditions such as diabetes and high blood pressure.  Dental: Annual dental screenings help detect early signs of oral cancer, gum disease, and other conditions linked to overall health, including heart disease and diabetes.  Please see the attached documents for additional preventive care recommendations.

## 2024-05-06 ENCOUNTER — Encounter: Payer: Self-pay | Admitting: Hematology and Oncology

## 2024-05-06 NOTE — Telephone Encounter (Signed)
 error

## 2024-05-07 ENCOUNTER — Other Ambulatory Visit (HOSPITAL_COMMUNITY): Payer: Self-pay

## 2024-05-07 ENCOUNTER — Telehealth: Payer: Self-pay

## 2024-05-07 NOTE — Telephone Encounter (Signed)
 Mrs. [name] presented to the cancer center today without an appointment requesting to speak directly with Dr. Loretha. As Dr. Loretha was with another patient, I greeted Mrs. [name] and her husband in the waiting room, then escorted them to an exam room to speak privately.  Mrs. [name] requested that I evaluate her injection site, stating that the injection administered on 11/13 "was not done correctly" and has caused persistent pain. On examination, the injection site appeared clear, with no redness, swelling, or signs of infection. She reports localized pain in one area of the right gluteus maximus. Pt is requesting this information be given to Dr [name]. She continues to question why she is on faslodex , despite her visits with Dr [name] and [name] when she was educated on it both times. All concerns have been escalated to upper management.

## 2024-05-08 ENCOUNTER — Other Ambulatory Visit: Payer: Self-pay

## 2024-05-08 ENCOUNTER — Encounter: Payer: Self-pay | Admitting: Pharmacist

## 2024-05-08 ENCOUNTER — Telehealth: Payer: Self-pay

## 2024-05-08 NOTE — Telephone Encounter (Signed)
 Called Mrs. Riede back about her injection concerns. She is not in as much pain anymore. I advised her on her next visit, I would come in to assess where her injections are to be given. Andrea CHRISTELLA Plunk, RN

## 2024-05-08 NOTE — Progress Notes (Signed)
 Pharmacy Quality Measure Review  This patient is appearing on a report for being at risk of failing the adherence measure for cholesterol (statin) medications this calendar year.   Medication: Lovastatin  Last fill date: 04/17/24 for 90 day supply  Insurance report was not up to date. No action needed at this time.   Darrelyn Drum, PharmD, BCPS, CPP Clinical Pharmacist Practitioner [facility] [phone]

## 2024-05-08 NOTE — Progress Notes (Signed)
 Specialty Pharmacy Refill Coordination Note  [name] is a 78 y.o. female contacted today regarding refills of specialty medication(s) Palbociclib  (IBRANCE )   Patient requested [name] at [facility] [employer] date: 05/16/24   Medication will be filled on: 05/15/24

## 2024-05-13 ENCOUNTER — Other Ambulatory Visit: Payer: Self-pay | Admitting: Interventional Radiology

## 2024-05-13 DIAGNOSIS — H401134 Primary open-angle glaucoma, bilateral, indeterminate stage: Secondary | ICD-10-CM | POA: Diagnosis not present

## 2024-05-13 DIAGNOSIS — E1169 Type 2 diabetes mellitus with other specified complication: Secondary | ICD-10-CM | POA: Diagnosis not present

## 2024-05-13 DIAGNOSIS — Z01818 Encounter for other preprocedural examination: Secondary | ICD-10-CM

## 2024-05-13 DIAGNOSIS — Z961 Presence of intraocular lens: Secondary | ICD-10-CM | POA: Diagnosis not present

## 2024-05-13 DIAGNOSIS — H353132 Nonexudative age-related macular degeneration, bilateral, intermediate dry stage: Secondary | ICD-10-CM | POA: Diagnosis not present

## 2024-05-13 LAB — OPHTHALMOLOGY REPORT-SCANNED

## 2024-05-13 NOTE — H&P (Signed)
 Chief Complaint: Metastatic breast cancer, enlarging liver lesion; referred for image guided liver lesion biopsy to assess estrogen receptor status changes.   Referring Provider(s): Iruku,P  Supervising Physician: Jennefer Rover  Patient Status: [facility] - Out-pt  History of Present Illness: [name] is a 78 y.o. female with PMH sig for DM, DVT, GERD, glaucoma, uterine cancer 2000, HLD, HTN, OA, PVD, sleep apnea, TIA, and metastatic right breast cancer that was initially diagnosed in 2019, with progression noted in the liver. Previous treatments included Ibrance  and letrozole , which were effective for several years. Recent imaging showed an increase in liver lesion size from 1.1 cm in March to 2.5 x 2.0 cm in September. She is scheduled today for liver lesion biopsy to assess estrogen receptor status changes.   *** Patient is Full Code  Past Medical History:  Diagnosis Date   Breast cancer (HCC)    Cancer (HCC) 02/2016   right breast   DIABETES MELLITUS, TYPE II 01/04/2007   only takes actoplus daily   Dizziness and giddiness 02/29/2008   DVT, HX OF    at age 4 in right buttocks   Dyspnea    due to lung cancer   Family history of breast cancer    GERD 01/04/2007   pt reports resolved    GLAUCOMA 07/30/2008   both eyes   History of blood transfusion    no abnormal  reaction   History of uterine cancer 2000   hysterectomy done   HYPERLIPIDEMIA 01/04/2007   taking Pravastatin  daily   HYPERTENSION 01/04/2007   takes Lisinopril  daily   Joint pain    Joint swelling    Leg cramps    LEG PAIN, LEFT 07/06/2007   NUMBNESS 07/30/2008   in fingers;pt states from Diamox    OSTEOARTHRITIS, HIP 09/25/2009   OTITIS MEDIA, ACUTE, BILATERAL 02/29/2008   Overweight(278.02) 01/04/2007   Peripheral vascular disease    Personal history of radiation therapy 2018   Pneumonia    PONV (postoperative nausea and vomiting)    SLEEP APNEA, OBSTRUCTIVE    doesn't use a cpap;study done about 58yrs ago    TRANSIENT ISCHEMIC ATTACK, HX OF 01/04/2007   Vision loss    left eye    Past Surgical History:  Procedure Laterality Date   ABDOMINAL HYSTERECTOMY  2000   BREAST LUMPECTOMY Right 01/13/2017   x2   BREAST LUMPECTOMY WITH RADIOACTIVE SEED AND SENTINEL LYMPH NODE BIOPSY Right 01/13/2017   Procedure: RIGHT BREAST RADIOACTIVE SEED X'S 2 GUIDED LUMPECTOMY WITH RADIOACTIVE SEED TARGETED AXILLARYLYMPH NODE EXCISION AND RIGHT AXILLARY SENTINEL LYMPH NODE BIOPSY;  Surgeon: Ethyl Lenis, MD;  Location: [facility] OR;  Service: General;  Laterality: Right;  2 SEEDS IN RIGHT BREAST 1 SEED IN RIGHT AXILLARY NODE   CHOLECYSTECTOMY     ENDOBRONCHIAL ULTRASOUND Bilateral 03/28/2016   Procedure: ENDOBRONCHIAL ULTRASOUND;  Surgeon: Lamar GORMAN Chris, MD;  Location: [facility] ENDOSCOPY;  Service: Cardiopulmonary;  Laterality: Bilateral;   EYE SURGERY  13   shunt left and lazer eye surgery on right cataract and retenia tear with repair   growth removal  2004   from thumb   KNEE ARTHROSCOPY Right    mulitple eye surgeries     both eyes, cataracts with ioc done both eyes   OOPHORECTOMY     right lumpectomy with axillary node dissection Right 01/2017   TOTAL HIP ARTHROPLASTY  06/24/2011   Procedure: TOTAL HIP ARTHROPLASTY;  Surgeon: Dempsey JINNY Sensor;  Location: [facility] OR;  Service: Orthopedics;  Laterality: Right;   TOTAL HIP ARTHROPLASTY Left 11/12/2012   Dr Liam   TOTAL HIP ARTHROPLASTY Left 11/12/2012   Procedure: TOTAL HIP ARTHROPLASTY;  Surgeon: Dempsey JINNY Liam, MD;  Location: [facility] OR;  Service: Orthopedics;  Laterality: Left;  DEPUY PINNACLE    Allergies: Codeine , Fluorescein , Oxycodone , Lipitor [atorvastatin  calcium ], Sitagliptin phosphate, and Sulfa drugs cross reactors  Medications: Prior to Admission medications   Medication Sig Start Date End Date Taking? Authorizing Provider  acetaminophen  (TYLENOL ) 500 MG tablet Take 1,000 mg by mouth every 4 (four) hours as needed for moderate pain or fever. Patient taking  differently: Take 1,000 mg by mouth every 6 (six) hours as needed for moderate pain (pain score 4-6) or fever.    [provider]  aspirin  EC 81 MG tablet Take 81 mg by mouth daily at 6 PM. 1700    [provider]  Baclofen  5 MG TABS Take 0.5-1 tablets (2.5-5 mg total) by mouth 2 (two) times daily as needed. 04/29/24   Merlynn Niki FALCON, FNP  Biotin 1 MG CAPS Take by mouth.    [provider]  brimonidine  (ALPHAGAN ) 0.2 % ophthalmic solution INSTILL 1 DROP INTO EACH EYE THREE TIMES DAILY 11/23/20   [provider]  cholecalciferol (VITAMIN D) 1000 units tablet Take 1,000 Units by mouth daily.    [provider]  dorzolamide-timolol  (COSOPT) 22.3-6.8 MG/ML ophthalmic solution Place 1 drop into both eyes 2 (two) times daily. 12/20/16   [provider]  gabapentin  (NEURONTIN ) 100 MG capsule Take 200 mg at bedtime, can increase to 300 mg at bedtime if tolerated 12/13/22   Geofm Glade JINNY, MD  ketorolac (ACULAR) 0.5 % ophthalmic solution Place 1 drop into the right eye 2 (two) times daily. 11/29/22   [provider]  lisinopril  (ZESTRIL ) 5 MG tablet Take 1 tablet by mouth once daily 03/18/24   Rollene Almarie LABOR, MD  lovastatin  (MEVACOR ) 20 MG tablet TAKE 1 TABLET BY MOUTH AT BEDTIME 01/22/24   Rollene Almarie LABOR, MD  meloxicam  (MOBIC ) 15 MG tablet Take 1 tablet (15 mg total) by mouth daily. Take with food 12/13/22   Geofm Glade JINNY, MD  Netarsudil-Latanoprost (ROCKLATAN ) 0.02-0.005 % SOLN Apply to eye. 06/30/20   Magrinat, Sandria BROCKS, MD  ondansetron  (ZOFRAN -ODT) 4 MG disintegrating tablet Take 1 tablet (4 mg total) by mouth every 8 (eight) hours as needed for nausea or vomiting. 04/24/24   Iruku, Praveena, MD  palbociclib  (IBRANCE ) 75 MG tablet Take 1 tablet (75 mg total) by mouth daily. Take for 21 days on, 7 days off, repeat every 28 days. 06/26/23   Iruku, Praveena, MD  pantoprazole  (PROTONIX ) 40 MG tablet Take 1 tablet (40 mg total) by mouth daily.  02/02/24   Abran Norleen SAILOR, MD  pioglitazone -metformin  (ACTOPLUS MET ) 15-850 MG tablet Take 1 tablet by mouth daily. 09/21/23   Rollene Almarie LABOR, MD  prednisoLONE  acetate (PRED FORTE ) 1 % ophthalmic suspension SMARTSIG:1 In Eye(s) 6 Times Daily 01/16/20   [provider]     Family History  Problem Relation Age of Onset   Breast cancer Mother 82   Dementia Mother    Cancer Mother        Breast and lung cancer   Stroke Sister    Breast cancer Sister 49   Heart attack Maternal Aunt    Lung cancer Maternal Grandmother        non smoker   Glaucoma Maternal Grandfather    Anesthesia problems Neg Hx  Colon cancer Neg Hx    Esophageal cancer Neg Hx    Rectal cancer Neg Hx    Stomach cancer Neg Hx     Social History   Socioeconomic History   Marital status: Married    Spouse name: Debby   Number of children: Not on file   Years of education: Not on file   Highest education level: Not on file  Occupational History   Occupation: physiological scientist    Employer: [employer]   Occupation: retired  Tobacco Use   Smoking status: Never   Smokeless tobacco: Never  Vaping Use   Vaping status: Never Used  Substance and Sexual Activity   Alcohol use: No   Drug use: No   Sexual activity: Not Currently  Other Topics Concern   Not on file  Social History Narrative   Lives with husband/2025   Social Drivers of Health   Financial Resource Strain: Low Risk  (02/06/2023)   Overall Financial Resource Strain (CARDIA)    Difficulty of Paying Living Expenses: Not hard at all  Food Insecurity: No Food Insecurity (05/02/2024)   Hunger Vital Sign    Worried About Running Out of Food in the Last Year: Never true    Ran Out of Food in the Last Year: Never true  Transportation Needs: No Transportation Needs (05/02/2024)   PRAPARE - Administrator, Civil Service (Medical): No    Lack of Transportation (Non-Medical): No  Physical Activity: Inactive  (05/02/2024)   Exercise Vital Sign    Days of Exercise per Week: 0 days    Minutes of Exercise per Session: 0 min  Stress: No Stress Concern Present (05/02/2024)   [employer] of Occupational Health - Occupational Stress Questionnaire    Feeling of Stress: Only a little  Social Connections: Socially Integrated (05/02/2024)   Social Connection and Isolation Panel    Frequency of Communication with Friends and Family: More than three times a week    Frequency of Social Gatherings with Friends and Family: Three times a week    Attends Religious Services: More than 4 times per year    Active Member of Clubs or Organizations: Yes    Attends Banker Meetings: Never    Marital Status: Married       Review of Systems  Vital Signs:   Advance Care Plan: no documents on file   Physical Exam  Imaging: No results found.  Labs:  CBC: Recent Labs    09/19/23 1035 01/02/24 1054 03/21/24 0938 04/16/24 1431  WBC 2.5* 2.6* 1.7* 2.4*  HGB 13.2 12.7 12.2 12.1  HCT 37.4 37.8 34.5* 34.9*  PLT 193 142* 179 182    COAGS: No results for input(s): INR, APTT in the last 8760 hours.  BMP: Recent Labs    09/19/23 1035 01/02/24 1208 03/21/24 0939 04/16/24 1431  NA 136 140 139 141  K 4.4 4.5 4.1 4.2  CL 104 106 106 107  CO2 26 27 27 28   GLUCOSE 354* 175* 236* 136*  BUN 15 16 19 16   CALCIUM  9.2 9.2 9.4 9.0  CREATININE 0.80 0.67 0.78 0.77  GFRNONAA >60 >60 >60 >60    LIVER FUNCTION TESTS: Recent Labs    09/19/23 1035 01/02/24 1208 03/21/24 0939 04/16/24 1431  BILITOT 0.5 0.5 0.4 0.4  AST 13* 13* 11* 12*  ALT 16 13 12 12   ALKPHOS 70 61 52 53  PROT 7.0 7.0 6.8 6.6  ALBUMIN  4.3 4.4 4.0  4.0    TUMOR MARKERS: No results for input(s): AFPTM, CEA, CA199, CHROMGRNA in the last 8760 hours.  Assessment and Plan: 78 y.o. female with PMH sig for DM, DVT, GERD, glaucoma, uterine cancer 2000, HLD, HTN, OA, PVD, sleep apnea, TIA, and metastatic  right breast cancer that was initially diagnosed in 2019, with progression noted in the liver. Previous treatments included Ibrance  and letrozole , which were effective for several years. Recent imaging showed an increase in liver lesion size from 1.1 cm in March to 2.5 x 2.0 cm in September. She is scheduled today for liver lesion biopsy to assess estrogen receptor status changes.Risks and benefits of procedure was discussed with the patient  including, but not limited to bleeding, infection, damage to adjacent structures or low yield requiring additional tests.  All of the questions were answered and there is agreement to proceed.  Consent signed and in chart.    Thank you for allowing our service to participate in [name] 's care.  Electronically Signed: D. Franky Rakers, PA-C   05/13/2024, 2:45 PM      I spent a total of  25 minutes   in face to face in clinical consultation, greater than 50% of which was counseling/coordinating care for image guided liver lesion biopsy

## 2024-05-14 ENCOUNTER — Ambulatory Visit (HOSPITAL_COMMUNITY)
Admission: RE | Admit: 2024-05-14 | Discharge: 2024-05-14 | Disposition: A | Source: Ambulatory Visit | Attending: Hematology and Oncology

## 2024-05-14 ENCOUNTER — Encounter (HOSPITAL_COMMUNITY): Payer: Self-pay

## 2024-05-14 ENCOUNTER — Other Ambulatory Visit: Payer: Self-pay | Admitting: Hematology and Oncology

## 2024-05-14 ENCOUNTER — Other Ambulatory Visit: Payer: Self-pay

## 2024-05-14 DIAGNOSIS — Z01818 Encounter for other preprocedural examination: Secondary | ICD-10-CM | POA: Insufficient documentation

## 2024-05-14 DIAGNOSIS — K769 Liver disease, unspecified: Secondary | ICD-10-CM | POA: Insufficient documentation

## 2024-05-14 DIAGNOSIS — C50911 Malignant neoplasm of unspecified site of right female breast: Secondary | ICD-10-CM

## 2024-05-14 DIAGNOSIS — C801 Malignant (primary) neoplasm, unspecified: Secondary | ICD-10-CM | POA: Insufficient documentation

## 2024-05-14 DIAGNOSIS — R160 Hepatomegaly, not elsewhere classified: Secondary | ICD-10-CM | POA: Diagnosis not present

## 2024-05-14 DIAGNOSIS — C787 Secondary malignant neoplasm of liver and intrahepatic bile duct: Secondary | ICD-10-CM | POA: Insufficient documentation

## 2024-05-14 LAB — PROTIME-INR
INR: 1.1 (ref 0.8–1.2)
Prothrombin Time: 14.6 s (ref 11.4–15.2)

## 2024-05-14 LAB — GLUCOSE, CAPILLARY: Glucose-Capillary: 161 mg/dL — ABNORMAL HIGH (ref 70–99)

## 2024-05-14 LAB — CBC
HCT: 35.3 % — ABNORMAL LOW (ref 36.0–46.0)
Hemoglobin: 11.9 g/dL — ABNORMAL LOW (ref 12.0–15.0)
MCH: 32.5 pg (ref 26.0–34.0)
MCHC: 33.7 g/dL (ref 30.0–36.0)
MCV: 96.4 fL (ref 80.0–100.0)
Platelets: 183 K/uL (ref 150–400)
RBC: 3.66 MIL/uL — ABNORMAL LOW (ref 3.87–5.11)
RDW: 14.3 % (ref 11.5–15.5)
WBC: 2.3 K/uL — ABNORMAL LOW (ref 4.0–10.5)
nRBC: 0 % (ref 0.0–0.2)

## 2024-05-14 MED ORDER — HYDRALAZINE HCL 20 MG/ML IJ SOLN
INTRAMUSCULAR | Status: AC | PRN
  Administered 2024-05-14: 10 mg via INTRAVENOUS

## 2024-05-14 MED ORDER — MIDAZOLAM HCL (PF) 2 MG/2ML IJ SOLN
INTRAMUSCULAR | Status: AC | PRN
  Administered 2024-05-14: .5 mg via INTRAVENOUS
  Administered 2024-05-14: 1 mg via INTRAVENOUS

## 2024-05-14 MED ORDER — ONDANSETRON HCL 4 MG/2ML IJ SOLN: 4.0000 mg | Freq: Once | INTRAMUSCULAR | Status: DC

## 2024-05-14 MED ORDER — SODIUM CHLORIDE 0.9 % IV SOLN: INTRAVENOUS | Status: DC

## 2024-05-14 MED ORDER — FENTANYL CITRATE (PF) 100 MCG/2ML IJ SOLN
INTRAMUSCULAR | Status: AC | PRN
  Administered 2024-05-14 (×2): 50 ug via INTRAVENOUS

## 2024-05-14 MED ORDER — MIDAZOLAM HCL 2 MG/2ML IJ SOLN
INTRAMUSCULAR | Status: AC
  Filled 2024-05-14: qty 2

## 2024-05-14 MED ORDER — FENTANYL CITRATE (PF) 100 MCG/2ML IJ SOLN
INTRAMUSCULAR | Status: AC
  Filled 2024-05-14: qty 2

## 2024-05-14 MED ORDER — FENTANYL CITRATE (PF) 100 MCG/2ML IJ SOLN: 25.0000 ug | Freq: Once | INTRAMUSCULAR | Status: DC

## 2024-05-14 MED ORDER — LIDOCAINE-EPINEPHRINE (PF) 2 %-1:200000 IJ SOLN
INTRAMUSCULAR | Status: AC
  Filled 2024-05-14: qty 20

## 2024-05-14 MED ORDER — HYDRALAZINE HCL 20 MG/ML IJ SOLN
INTRAMUSCULAR | Status: AC
  Filled 2024-05-14: qty 1

## 2024-05-14 NOTE — Progress Notes (Signed)
 IR Brief Progress Note:  IR called for patient c/o 10/10 pain level at site of liver biopsy. Gauze and transparent dressing clean, dry, and intact. Mildly tender to site on palpation. During assessment, patient reported pain level decreasing to 5/10. No further action required at this time. Dr. Ester Sides aware.     Zakari Couchman B Charliene Inoue NP 05/14/2024 4:48 PM

## 2024-05-14 NOTE — Progress Notes (Signed)
 Dr. Jennefer in to see pt. Agrees with PA- orders placed- for Zofran  and Fentynal if needed.   Also pt. May be discharged at 1830 if stable.

## 2024-05-14 NOTE — Sedation Documentation (Signed)
 Per MD Suttle, modality must be changed from US  to CT for better visualization. Pt and family aware, all questions answered by this RN.

## 2024-05-14 NOTE — Progress Notes (Signed)
 Pt was returned from IR- not able to do the procedure- will wait  for availablity in CT

## 2024-05-14 NOTE — Progress Notes (Incomplete)
 Patient returned from procedure, severe pain 10/10 at biopsy site.  Informed Dr. Jennefer.  Pt has gelfoam at site.   Patient anxious and stated

## 2024-05-14 NOTE — Discharge Instructions (Signed)
 For questions /concerns may call Interventional Radiology at [phone] or  Interventional Radiology clinic [phone]   You may remove your dressing and shower tomorrow afternoon                                                                       Liver Biopsy, Care After After a liver biopsy, it is common to have these things in the area where the biopsy was done. You may: Have pain. Feel sore. Have bruising. You may also feel tired for a few days. Follow these instructions at home: Medicines Take over-the-counter and prescription medicines only as told by your doctor. If you were prescribed an antibiotic medicine, take it as told by your doctor. Do not stop taking the antibiotic, even if you start to feel better. Do not take medicines that may thin your blood. These medicines include aspirin and ibuprofen. Take them only if your doctor tells you to. If told, take steps to prevent problems with pooping (constipation). You may need to: Drink enough fluid to keep your pee (urine) pale yellow. Take medicines. You will be told what medicines to take. Eat foods that are high in fiber. These include beans, whole grains, and fresh fruits and vegetables. Limit foods that are high in fat and sugar. These include fried or sweet foods. Ask your doctor if you should avoid driving or using machines while you are taking your medicine. Caring for your incision Follow instructions from your doctor about how to take care of your cut from surgery (incisions). Make sure you: Wash your hands with soap and water for at least 20 seconds before and after you change your bandage. If you cannot use soap and water, use hand sanitizer. Change your bandage. Leavestitches or skin glue in place for at least two weeks. Leave tape strips alone unless you are told to take them off. You may trim the edges of the tape strips if they curl up. Check your incision every day for signs of infection. Check for: Redness,  swelling, or more pain. Fluid or blood. Warmth. Pus or a bad smell. Do not take baths, swim, or use a hot tub. Ask your doctor about taking showers or sponge baths. Activity Rest at home for 1-2 days, or as told by your doctor. Get up to take short walks every 1 to 2 hours. Ask for help if you feel weak or unsteady. Do not lift anything that is heavier than 10 lb (4.5 kg), or the limit that you are told. Do not play contact sports for 2 weeks after the procedure. Return to your normal activities as told by your doctor. Ask what activities are safe for you. General instructions  Do not drink alcohol in the first week after the procedure. Plan to have a responsible adult care for you for the time you are told after you leave the hospital or clinic. This is important. It is up to you to get the results of your procedure. Ask how to get your results when they are ready. Keep all follow-up visits. Contact a doctor if: You have more bleeding in your incision. Your incision swells, or is red and more painful. You have fluid that comes from your incision. You develop a rash.  You have fever or chills. Get help right away if: You have swelling, bloating, or pain in your belly (abdomen). You get dizzy or faint. You vomit or you feel like vomiting. You have trouble breathing or feel short of breath. You have chest pain. You have problems talking or seeing. You have trouble with your balance or moving your arms or legs. These symptoms may be an emergency. Get help right away. Call your local emergency services (911 in the [place] Do not wait to see if the symptoms will go away. Do not drive yourself to the hospital. Summary After the procedure, it is common to have pain, soreness, bruising, and tiredness. Your doctor will tell you how to take care of yourself at home. Change your bandage, take your medicines, and limit your activities as told by your doctor. Call your doctor if you have  symptoms of infection. Get help right away if your belly swells, your cut bleeds a lot, or you have trouble talking or breathing. This information is not intended to replace advice given to you by your health care provider. Make sure you discuss any questions you have with your health care provider. Document Revised: 04/13/2020 Document Reviewed: 04/13/2020 Elsevier Patient Education  2024 Elsevier Inc.                                                               Moderate Conscious Sedation, Adult, Care After After the procedure, it is common to have: Sleepiness for a few hours. Impaired judgment for a few hours. Trouble with balance. Nausea or vomiting if you eat too soon. Follow these instructions at home: For the time period you were told by your health care provider:  Rest. Do not participate in activities where you could fall or become injured. Do not drive or use machinery. Do not drink alcohol. Do not take sleeping pills or medicines that cause drowsiness. Do not make important decisions or sign legal documents. Do not take care of children on your own. Eating and drinking Follow instructions from your health care provider about what you may eat and drink. Drink enough fluid to keep your urine pale yellow. If you vomit: Drink clear fluids slowly and in small amounts as you are able. Clear fluids include water, ice chips, low-calorie sports drinks, and fruit juice that has water added to it (diluted fruit juice). Eat light and bland foods in small amounts as you are able. These foods include bananas, applesauce, rice, lean meats, toast, and crackers. General instructions Take over-the-counter and prescription medicines only as told by your health care provider. Have a responsible adult stay with you for the time you are told. Do not use any products that contain nicotine or tobacco. These products include cigarettes, chewing tobacco, and vaping devices, such as e-cigarettes. If  you need help quitting, ask your health care provider. Return to your normal activities as told by your health care provider. Ask your health care provider what activities are safe for you. Your health care provider may give you more instructions. Make sure you know what you can and cannot do. Contact a health care provider if: You are still sleepy or having trouble with balance after 24 hours. You feel light-headed. You vomit every time you eat or drink. You get  a rash. You have a fever. You have redness or swelling around the IV site. Get help right away if: You have trouble breathing. You start to feel confused at home. These symptoms may be an emergency. Get help right away. Call 911. Do not wait to see if the symptoms will go away. Do not drive yourself to the hospital. This information is not intended to replace advice given to you by your health care provider. Make sure you discuss any questions you have with your health care provider. Document Revised: 12/13/2021 Document Reviewed: 12/13/2021 Elsevier Patient Education  2024 [employer].

## 2024-05-14 NOTE — Procedures (Signed)
 Interventional Radiology Procedure Note  Procedure: CT guided liver mass biopsy   Findings: Please refer to procedural dictation for full description. Mass not conspicuous with ultrasound.  18 ga core x2 from anterior approach.  Gelfoam slurry needle track embolization.  Complications: None immediate  Estimated Blood Loss: < 5 ml  Recommendations: Strict 3 hour bedrest. Follow Pathology results.   Ester Sides, MD

## 2024-05-15 ENCOUNTER — Emergency Department (HOSPITAL_COMMUNITY)

## 2024-05-15 ENCOUNTER — Other Ambulatory Visit: Payer: Self-pay

## 2024-05-15 ENCOUNTER — Encounter (HOSPITAL_COMMUNITY): Payer: Self-pay

## 2024-05-15 ENCOUNTER — Inpatient Hospital Stay (HOSPITAL_COMMUNITY)
Admission: EM | Admit: 2024-05-15 | Discharge: 2024-05-18 | DRG: 445 | Disposition: A | Source: Ambulatory Visit | Attending: Family Medicine | Admitting: Family Medicine

## 2024-05-15 ENCOUNTER — Telehealth: Payer: Self-pay

## 2024-05-15 ENCOUNTER — Other Ambulatory Visit: Payer: Self-pay | Admitting: Interventional Radiology

## 2024-05-15 ENCOUNTER — Ambulatory Visit
Admission: RE | Admit: 2024-05-15 | Discharge: 2024-05-15 | Disposition: A | Source: Ambulatory Visit | Attending: Interventional Radiology | Admitting: Interventional Radiology

## 2024-05-15 DIAGNOSIS — C50919 Malignant neoplasm of unspecified site of unspecified female breast: Secondary | ICD-10-CM | POA: Diagnosis not present

## 2024-05-15 DIAGNOSIS — K838 Other specified diseases of biliary tract: Secondary | ICD-10-CM

## 2024-05-15 DIAGNOSIS — E119 Type 2 diabetes mellitus without complications: Secondary | ICD-10-CM

## 2024-05-15 DIAGNOSIS — Z86718 Personal history of other venous thrombosis and embolism: Secondary | ICD-10-CM | POA: Diagnosis not present

## 2024-05-15 DIAGNOSIS — K2970 Gastritis, unspecified, without bleeding: Secondary | ICD-10-CM

## 2024-05-15 DIAGNOSIS — R1114 Bilious vomiting: Secondary | ICD-10-CM | POA: Diagnosis not present

## 2024-05-15 DIAGNOSIS — R7401 Elevation of levels of liver transaminase levels: Secondary | ICD-10-CM | POA: Diagnosis not present

## 2024-05-15 DIAGNOSIS — I10 Essential (primary) hypertension: Secondary | ICD-10-CM | POA: Diagnosis not present

## 2024-05-15 DIAGNOSIS — K219 Gastro-esophageal reflux disease without esophagitis: Secondary | ICD-10-CM | POA: Diagnosis not present

## 2024-05-15 DIAGNOSIS — K8050 Calculus of bile duct without cholangitis or cholecystitis without obstruction: Secondary | ICD-10-CM | POA: Diagnosis not present

## 2024-05-15 DIAGNOSIS — Z4682 Encounter for fitting and adjustment of non-vascular catheter: Secondary | ICD-10-CM | POA: Diagnosis not present

## 2024-05-15 DIAGNOSIS — Z9889 Other specified postprocedural states: Secondary | ICD-10-CM | POA: Diagnosis not present

## 2024-05-15 DIAGNOSIS — C50911 Malignant neoplasm of unspecified site of right female breast: Secondary | ICD-10-CM

## 2024-05-15 DIAGNOSIS — R1011 Right upper quadrant pain: Secondary | ICD-10-CM

## 2024-05-15 DIAGNOSIS — K769 Liver disease, unspecified: Secondary | ICD-10-CM | POA: Diagnosis not present

## 2024-05-15 DIAGNOSIS — Z9049 Acquired absence of other specified parts of digestive tract: Secondary | ICD-10-CM | POA: Diagnosis not present

## 2024-05-15 DIAGNOSIS — E1169 Type 2 diabetes mellitus with other specified complication: Secondary | ICD-10-CM | POA: Diagnosis present

## 2024-05-15 DIAGNOSIS — K760 Fatty (change of) liver, not elsewhere classified: Secondary | ICD-10-CM | POA: Diagnosis not present

## 2024-05-15 DIAGNOSIS — R000 Tachycardia, unspecified: Secondary | ICD-10-CM | POA: Diagnosis not present

## 2024-05-15 DIAGNOSIS — R7989 Other specified abnormal findings of blood chemistry: Secondary | ICD-10-CM | POA: Diagnosis not present

## 2024-05-15 DIAGNOSIS — C50411 Malignant neoplasm of upper-outer quadrant of right female breast: Secondary | ICD-10-CM

## 2024-05-15 DIAGNOSIS — K3189 Other diseases of stomach and duodenum: Secondary | ICD-10-CM | POA: Diagnosis not present

## 2024-05-15 LAB — URINALYSIS, ROUTINE W REFLEX MICROSCOPIC
Bacteria, UA: NONE SEEN
Bilirubin Urine: NEGATIVE
Glucose, UA: >=500 mg/dL — AB
Hgb urine dipstick: NEGATIVE
Ketones, ur: 20 mg/dL — AB
Leukocytes,Ua: NEGATIVE
Nitrite: NEGATIVE
Protein, ur: 100 mg/dL — AB
Specific Gravity, Urine: >1.046 — ABNORMAL HIGH (ref 1.005–1.030)
pH: 5 (ref 5.0–8.0)

## 2024-05-15 LAB — COMPREHENSIVE METABOLIC PANEL WITH GFR
ALT: 969 U/L — ABNORMAL HIGH (ref 0–44)
AST: 913 U/L — ABNORMAL HIGH (ref 15–41)
Albumin: 4.6 g/dL (ref 3.5–5.0)
Alkaline Phosphatase: 127 U/L — ABNORMAL HIGH (ref 38–126)
Anion gap: 14 (ref 5–15)
BUN: 9 mg/dL (ref 8–23)
CO2: 24 mmol/L (ref 22–32)
Calcium: 9.4 mg/dL (ref 8.9–10.3)
Chloride: 100 mmol/L (ref 98–111)
Creatinine, Ser: 0.77 mg/dL (ref 0.44–1.00)
GFR, Estimated: >60 mL/min (ref >60)
Glucose, Bld: 279 mg/dL — ABNORMAL HIGH (ref 70–99)
Potassium: 3.8 mmol/L (ref 3.5–5.1)
Sodium: 138 mmol/L (ref 135–145)
Total Bilirubin: 5.7 mg/dL — ABNORMAL HIGH (ref 0.0–1.2)
Total Protein: 7.6 g/dL (ref 6.5–8.1)

## 2024-05-15 LAB — CBC
HCT: 39.4 % (ref 36.0–46.0)
Hemoglobin: 13.8 g/dL (ref 12.0–15.0)
MCH: 32.8 pg (ref 26.0–34.0)
MCHC: 35 g/dL (ref 30.0–36.0)
MCV: 93.6 fL (ref 80.0–100.0)
Platelets: 184 K/uL (ref 150–400)
RBC: 4.21 MIL/uL (ref 3.87–5.11)
RDW: 14.5 % (ref 11.5–15.5)
WBC: 4.2 K/uL (ref 4.0–10.5)
nRBC: 0 % (ref 0.0–0.2)

## 2024-05-15 LAB — I-STAT CG4 LACTIC ACID, ED
Lactic Acid, Venous: 1.7 mmol/L (ref 0.5–1.9)
Lactic Acid, Venous: 1.9 mmol/L (ref 0.5–1.9)

## 2024-05-15 LAB — LIPASE, BLOOD: Lipase: 153 U/L — ABNORMAL HIGH (ref 11–51)

## 2024-05-15 MED ORDER — ONDANSETRON HCL 4 MG/2ML IJ SOLN
4.0000 mg | Freq: Once | INTRAMUSCULAR | Status: AC
  Administered 2024-05-15: 4 mg via INTRAVENOUS
  Filled 2024-05-15: qty 2

## 2024-05-15 MED ORDER — HYDROMORPHONE HCL 1 MG/ML IJ SOLN
0.5000 mg | Freq: Once | INTRAMUSCULAR | Status: AC
  Administered 2024-05-15: 0.5 mg via INTRAVENOUS
  Filled 2024-05-15: qty 1

## 2024-05-15 MED ORDER — SODIUM CHLORIDE 0.9 % IV BOLUS
1000.0000 mL | Freq: Once | INTRAVENOUS | Status: AC
  Administered 2024-05-15: 1000 mL via INTRAVENOUS

## 2024-05-15 MED ORDER — IOPAMIDOL (ISOVUE-300) INJECTION 61%
100.0000 mL | Freq: Once | INTRAVENOUS | Status: AC | PRN
  Administered 2024-05-15: 100 mL via INTRAVENOUS

## 2024-05-15 NOTE — ED Provider Notes (Signed)
 [facility] EMERGENCY DEPARTMENT AT [facility] Provider Note   CSN: [id number] Arrival date & time: 05/15/24  1750     Patient presents with: Nausea and Emesis   [name] is a 78 y.o. female.   56 female with past medical history of breast cancer with metastases presenting to the emergency department today with abdominal pain and vomiting.  The patient did have recent liver biopsy.  She had CT scan ordered as an outpatient to evaluate for postprocedural complications and was noted to have biliary ductal dilation with no hematomas noted.  She has not been able to keep anything down over the past 2 days.  Reports that her vomit has been nonbloody and nonbilious.  Patient denies any fevers.  Came to the ER for further evaluation as due to ongoing symptoms.  He was sent by her oncologist for GI evaluation.   Emesis Associated symptoms: abdominal pain        Prior to Admission medications   Medication Sig Start Date End Date Taking? Authorizing Provider  acetaminophen  (TYLENOL ) 500 MG tablet Take 1,000 mg by mouth every 4 (four) hours as needed for moderate pain or fever. Patient taking differently: Take 1,000 mg by mouth every 6 (six) hours as needed for moderate pain (pain score 4-6) or fever.    [provider]  aspirin  EC 81 MG tablet Take 81 mg by mouth daily at 6 PM. 1700    [provider]  Baclofen  5 MG TABS Take 0.5-1 tablets (2.5-5 mg total) by mouth 2 (two) times daily as needed. 04/29/24   Merlynn Niki FALCON, FNP  Biotin 1 MG CAPS Take by mouth.    [provider]  brimonidine  (ALPHAGAN ) 0.2 % ophthalmic solution INSTILL 1 DROP INTO EACH EYE THREE TIMES DAILY 11/23/20   [provider]  cholecalciferol (VITAMIN D) 1000 units tablet Take 1,000 Units by mouth daily.    [provider]  dorzolamide-timolol  (COSOPT) 22.3-6.8 MG/ML ophthalmic solution Place 1 drop into both eyes 2 (two) times daily. 12/20/16   [provider]  gabapentin  (NEURONTIN ) 100 MG capsule Take 200 mg at bedtime, can increase to 300 mg at bedtime if tolerated 12/13/22   Geofm Glade PARAS, MD  ketorolac (ACULAR) 0.5 % ophthalmic solution Place 1 drop into the right eye 2 (two) times daily. 11/29/22   [provider]  lisinopril  (ZESTRIL ) 5 MG tablet Take 1 tablet by mouth once daily 03/18/24   Rollene Almarie LABOR, MD  lovastatin  (MEVACOR ) 20 MG tablet TAKE 1 TABLET BY MOUTH AT BEDTIME 01/22/24   Rollene Almarie LABOR, MD  meloxicam  (MOBIC ) 15 MG tablet Take 1 tablet (15 mg total) by mouth daily. Take with food 12/13/22   Geofm Glade PARAS, MD  Netarsudil-Latanoprost (ROCKLATAN ) 0.02-0.005 % SOLN Apply to eye. 06/30/20   Magrinat, Sandria JAYSON, MD  ondansetron  (ZOFRAN -ODT) 4 MG disintegrating tablet Take 1 tablet (4 mg total) by mouth every 8 (eight) hours as needed for nausea or vomiting. 04/24/24   Iruku, Praveena, MD  palbociclib  (IBRANCE ) 75 MG tablet Take 1 tablet (75 mg total) by mouth daily. Take for 21 days on, 7 days off, repeat every 28 days. 06/26/23   Iruku, Praveena, MD  pantoprazole  (PROTONIX ) 40 MG tablet Take 1 tablet (40 mg total) by mouth daily. 02/02/24   Abran Norleen SAILOR, MD  pioglitazone -metformin  (ACTOPLUS MET ) 15-850 MG tablet Take 1 tablet by mouth daily. 09/21/23   Rollene Almarie LABOR, MD  prednisoLONE  acetate (PRED  FORTE) 1 % ophthalmic suspension SMARTSIG:1 In Eye(s) 6 Times Daily 01/16/20   [provider]    Allergies: Codeine , Fluorescein , Oxycodone , Lipitor [atorvastatin  calcium ], Sitagliptin phosphate, and Sulfa drugs cross reactors    Review of Systems  Gastrointestinal:  Positive for abdominal pain, nausea and vomiting.  All other systems reviewed and are negative.   Updated Vital Signs BP (!) 123/55   Pulse (!) 113   Temp 97.7 F (36.5 C) (Oral)   Resp (!) 21   Ht 5' 4 (1.626 m)   Wt 104 kg   SpO2 94%   BMI 39.36 kg/m   Physical Exam Vitals and nursing note reviewed.   Gen: NAD,  chronically ill-appearing Eyes: PERRL, EOMI, scleral icterus noted HEENT: no oropharyngeal swelling Neck: trachea midline Resp: clear to auscultation bilaterally Card: RRR, no murmurs, rubs, or gallops Abd: Tender over right upper quadrant with no guarding or rebound Extremities: no calf tenderness, no edema Vascular: 2+ radial pulses bilaterally, 2+ DP pulses bilaterally Skin: no rashes Psyc: acting appropriately   (all labs ordered are listed, but only abnormal results are displayed) Labs Reviewed  LIPASE, BLOOD - Abnormal; Notable for the following components:      Result Value   Lipase 153 (*)    All other components within normal limits  COMPREHENSIVE METABOLIC PANEL WITH GFR - Abnormal; Notable for the following components:   Glucose, Bld 279 (*)    AST 913 (*)    ALT 969 (*)    Alkaline Phosphatase 127 (*)    Total Bilirubin 5.7 (*)    All other components within normal limits  URINALYSIS, ROUTINE W REFLEX MICROSCOPIC - Abnormal; Notable for the following components:   Color, Urine AMBER (*)    Specific Gravity, Urine >1.046 (*)    Glucose, UA >=500 (*)    Ketones, ur 20 (*)    Protein, ur 100 (*)    All other components within normal limits  CBC  I-STAT CG4 LACTIC ACID, ED  I-STAT CG4 LACTIC ACID, ED    EKG: EKG Interpretation Date/Time:  Wednesday May 15 2024 18:15:58 EST Ventricular Rate:  115 PR Interval:  155 QRS Duration:  82 QT Interval:  339 QTC Calculation: 469 R Axis:   -77  Text Interpretation: Sinus tachycardia Probable left atrial enlargement Inferior infarct, old Nonspecific ST and T wave abnormality Confirmed by Ula Barter [phone]) on 05/15/2024 6:19:42 PM  Radiology: US  Abdomen Limited RUQ (LIVER/GB) Result Date: 05/15/2024 CLINICAL DATA:  Right upper quadrant EXAM: ULTRASOUND ABDOMEN LIMITED RIGHT UPPER QUADRANT COMPARISON:  CT abdomen 05/15/2024 FINDINGS: Gallbladder: Surgically absent. Common bile duct: Diameter: Common bile duct is  dilated measuring 2.1 cm. No definitive common bile duct stone seen, although evaluation of the distal common bile duct is limited secondary to overlying bowel gas. Liver: No focal lesion identified. There is increase in parenchymal echogenicity. Portal vein is patent on color Doppler imaging with normal direction of blood flow towards the liver. Other: None. IMPRESSION: 1. Dilated common bile duct measuring up to 2.1 cm. No definitive common bile duct stone seen, although evaluation of the distal common bile duct is limited secondary to overlying bowel gas. Consider further evaluation with MRCP. 2. Hepatic steatosis. Electronically Signed   By: Greig Pique M.D.   On: 05/15/2024 20:29   CT ABDOMEN W WO CONTRAST Result Date: 05/15/2024 CLINICAL DATA:  Liver biopsy yesterday with new onset pain and vomiting. EXAM: CT ABDOMEN WITHOUT AND WITH CONTRAST TECHNIQUE: Multidetector CT imaging of  the abdomen was performed following the standard protocol before and following the bolus administration of intravenous contrast. RADIATION DOSE REDUCTION: This exam was performed according to the departmental dose-optimization program which includes automated exposure control, adjustment of the mA and/or kV according to patient size and/or use of iterative reconstruction technique. CONTRAST:  100mL ISOVUE -300 IOPAMIDOL  (ISOVUE -300) INJECTION 61% COMPARISON:  CT biopsy images obtained yesterday 05/14/2024; recent prior CT scan of the chest, abdomen and pelvis 03/11/2024. FINDINGS: Lower chest: No acute abnormality. Hepatobiliary: Hypoechoic solid lesion in the subcapsular and anterior aspect of segment 5 is essentially unchanged at 1.8 x 1.6 cm. No evidence of hemorrhage. Surgical changes of prior cholecystectomy with significant intra and extrahepatic biliary ductal dilatation which appears unchanged. High attenuation stone in the distal common bile duct consistent with choledocholithiasis. Pancreas: Unremarkable. No pancreatic  ductal dilatation or surrounding inflammatory changes. Spleen: Normal in size without focal abnormality. Adrenals/Urinary Tract: Normal left adrenal gland. Stable 1.2 cm left adrenal nodule. Complex cyst in the upper pole of the right kidney measures 3.2 cm and demonstrates internal Hounsfield units of approximately 40. This is similar to the unenhanced CT images from 03/11/2024 consistent with a hemorrhagic or proteinaceous cyst rather than an enhancing lesion. No definite enhancing lesion identified. Stomach/Bowel: No focal bowel wall thickening or evidence of obstruction. Vascular/Lymphatic: Scattered calcified atherosclerotic plaque. No evidence of aneurysm. No suspicious lymphadenopathy. Other: Surgical changes in the right breast. Small fat containing ventral hernia. No evidence of ascites. Musculoskeletal: No acute fracture or aggressive appearing lytic or blastic osseous lesion. IMPRESSION: 1. No evidence of hemorrhage or other complication related to the recent liver biopsy. 2. Similar appearance of severe intra and extrahepatic biliary ductal dilatation with associated choledocholithiasis. Recommend referral to gastroenterology if not previously obtained. 3. Additional ancillary findings as above without interval change. Electronically Signed   By: Wilkie Lent M.D.   On: 05/15/2024 16:04   CT LIVER MASS BIOPSY Result Date: 05/14/2024 INDICATION: 78 year old female with enlarging liver mass concerning for metastasis. EXAM: CT BIOPSY COMPARISON:  03/11/2024 MEDICATIONS: None. ANESTHESIA/SEDATION: Fentanyl  100 mcg IV; Versed  1.5 mg IV Sedation time: 10 minutes; The patient was continuously monitored during the procedure by the interventional radiology nurse under my direct supervision. CONTRAST:  None. COMPLICATIONS: None immediate. PROCEDURE: RADIATION DOSE REDUCTION: This exam was performed according to the departmental dose-optimization program which includes automated exposure control, adjustment  of the mA and/or kV according to patient size and/or use of iterative reconstruction technique. Informed consent was obtained from the patient following an explanation of the procedure, risks, benefits and alternatives. A time out was performed prior to the initiation of the procedure. The patient was positioned supine on the CT table and a limited CT was performed for procedural planning demonstrating hypoattenuating round mass in the anterior right dome of the liver. The procedure was planned. The operative site was prepped and draped in the usual sterile fashion. Appropriate trajectory was confirmed with a 22 gauge spinal needle after the adjacent tissues were anesthetized with 1% Lidocaine  with epinephrine . Under intermittent CT guidance, a 17 gauge coaxial needle was advanced into the peripheral aspect of the mass. Appropriate positioning was confirmed and a total of 2 samples were obtained with an 18 gauge core needle biopsy device. The co-axial needle was removed and hemostasis was achieved with manual compression. A limited postprocedural CT was negative for hemorrhage or additional complication. A dressing was placed. The patient tolerated the procedure well without immediate postprocedural complication. IMPRESSION: Technically successful CT guided  core needle biopsy of right lobe liver mass. Ester Sides, MD [facility] Radiology Electronically Signed   By: Ester Sides M.D.   On: 05/14/2024 16:45     Procedures   Medications Ordered in the ED  ondansetron  (ZOFRAN ) injection 4 mg (4 mg Intravenous Given 05/15/24 1920)  sodium chloride  0.9 % bolus 1,000 mL (0 mLs Intravenous Stopped 05/15/24 2029)  HYDROmorphone  (DILAUDID ) injection 0.5 mg (0.5 mg Intravenous Given 05/15/24 1921)                                    Medical Decision Making 78 year old female past medical history of hypertension and metastatic breast cancer presents emergency  department today with upper abdominal pain and concern for biliary ductal dilation on outpatient CT scan.  Will further evaluate the patient here with LFTs and discussed her case with gastroenterology.  Give patient IV fluids here as well as Dilaudid  and Zofran  for symptoms.  I will reevaluate but she will require admission.  The patient's LFTs are elevated here.  She is afebrile here.  Ultrasound does show common bile duct dilation.  Calls placed to [facility] to make them aware.  Hospitalist to hospital service for admission.  Amount and/or Complexity of Data Reviewed Labs: ordered.  Risk Prescription drug management. Decision regarding hospitalization.        Final diagnoses:  Choledocholithiasis    ED Discharge Orders     None          Ula Prentice SAUNDERS, MD 05/15/24 2248

## 2024-05-15 NOTE — ED Notes (Signed)
 The patient is requesting an update on plan of care as well as review of results. Dr. Ula has been made aware via epic secure chat.

## 2024-05-15 NOTE — Telephone Encounter (Signed)
 Received message from Harman Rakers, PA with [facility] regarding CT abd results post- CT liver bx. Message below: Patient underwent follow-up CT today and there was no bleed post liver bx but her bile ducts are huge and she has a common bile duct stone. Dr. Karalee recommends that she see GI and if she does not improve quickly she might need to go to the emergency room to be admitted and worked up for biliary obstruction.  Per Dr Loretha, since o/p GI will not be able to see pt urgently, and she remains in pain with intractable N/V, it is recommended for her to go to ED for admission work-up.   Called pt per MD to advise. Conversation will be quoted due to previous negative interactions. Patient answers phone and I confirmed I was speaking with pt, asked how patient today and she states, Horrible. I asked pt if she was in pain and she raised her voice, exclaiming, Well yeah I'm always in pain! Extended apologies to pt and validated her concern for pain. Advised I was instructed to call per MD to recommend ED visit d/t CT findings and explained the reasoning and how she would need to likely be admitted and worked up from there. She was reluctantly agreeable and asked who from GI she would see. Advised patient that she would first see ED provider, then likely hospitalist who would then consult the on-call GI MD and I am not aware who that is. She states, well I would like to know who all of the drs are before I see them but oh well. Extended apology to pt for this information not being readily available. Mrs Schmuck was advised to go to [facility] ED. She states I threw up all in the CT scanner so I need to take a shower. Encouraged pt to take a shower and pack a bag for the hospital. She asked, how long am I going to be there? Advised pt this depends on the findings and recommendations from providers inpatient. She said OK and I told her I would call [facility] ED Charge RN to let them know she will be  coming. Extended to pt that I hope she feels better soon and she ended the call before I finished my sentence.   Call placed to [facility] ED Charge RN Ashley to make her aware and give report.

## 2024-05-15 NOTE — ED Provider Triage Note (Signed)
 Emergency Medicine Provider Triage Evaluation Note  AERICA Obrien , a 78 y.o. female  was evaluated in triage.  Pt complains of back pain and. Hx of lung, breast cancer (s/p right lobectomy)  Had ct abd with concern for biliary obstruction today. Hx of cholecystomy   Review of Systems  Positive: See hpi Negative:   Physical Exam  BP (!) 249/104 (BP Location: Left Arm)   Pulse (!) 128   Temp 98.9 F (37.2 C) (Oral)   Resp 16   Ht 5' 4 (1.626 m)   Wt 104 kg   SpO2 98%   BMI 39.36 kg/m  Gen:   Awake, no distress   Resp:  Normal effort  MSK:   Moves extremities without difficulty Other:    Medical Decision Making  Medically screening exam initiated at 6:33 PM.  Appropriate orders placed.  [name] was informed that the remainder of the evaluation will be completed by another provider, this initial triage assessment does not replace that evaluation, and the importance of remaining in the ED until their evaluation is complete.  Labs, RUQ US  ordered ordered. Morphine for pain. Zofran  for nausea   [name], [name] 05/15/24 1836

## 2024-05-15 NOTE — ED Notes (Signed)
 Writer attempted multiple IV sticks and was unsuccessful.

## 2024-05-15 NOTE — ED Notes (Signed)
 The patient was able to ambulate with stand by assistance to and from restroom.

## 2024-05-15 NOTE — ED Triage Notes (Signed)
 Pt came in POV, sent from cancer Center to get evaluated for a possible Biliary Obstruction. Pt states she has been generalized pain, N&V. Denies SOB. NAD

## 2024-05-16 ENCOUNTER — Other Ambulatory Visit (HOSPITAL_COMMUNITY): Payer: Self-pay

## 2024-05-16 ENCOUNTER — Observation Stay (HOSPITAL_COMMUNITY)

## 2024-05-16 DIAGNOSIS — K219 Gastro-esophageal reflux disease without esophagitis: Secondary | ICD-10-CM | POA: Diagnosis not present

## 2024-05-16 DIAGNOSIS — I10 Essential (primary) hypertension: Secondary | ICD-10-CM | POA: Diagnosis not present

## 2024-05-16 DIAGNOSIS — K769 Liver disease, unspecified: Secondary | ICD-10-CM

## 2024-05-16 DIAGNOSIS — Z86718 Personal history of other venous thrombosis and embolism: Secondary | ICD-10-CM

## 2024-05-16 DIAGNOSIS — C50919 Malignant neoplasm of unspecified site of unspecified female breast: Secondary | ICD-10-CM | POA: Diagnosis not present

## 2024-05-16 DIAGNOSIS — M7989 Other specified soft tissue disorders: Secondary | ICD-10-CM

## 2024-05-16 DIAGNOSIS — K8050 Calculus of bile duct without cholangitis or cholecystitis without obstruction: Principal | ICD-10-CM

## 2024-05-16 DIAGNOSIS — R7401 Elevation of levels of liver transaminase levels: Secondary | ICD-10-CM

## 2024-05-16 DIAGNOSIS — J45909 Unspecified asthma, uncomplicated: Secondary | ICD-10-CM | POA: Diagnosis not present

## 2024-05-16 LAB — COMPREHENSIVE METABOLIC PANEL WITH GFR
ALT: 906 U/L — ABNORMAL HIGH (ref 0–44)
AST: 555 U/L — ABNORMAL HIGH (ref 15–41)
Albumin: 3.7 g/dL (ref 3.5–5.0)
Alkaline Phosphatase: 120 U/L (ref 38–126)
Anion gap: 11 (ref 5–15)
BUN: 9 mg/dL (ref 8–23)
CO2: 24 mmol/L (ref 22–32)
Calcium: 8.4 mg/dL — ABNORMAL LOW (ref 8.9–10.3)
Chloride: 105 mmol/L (ref 98–111)
Creatinine, Ser: 0.61 mg/dL (ref 0.44–1.00)
GFR, Estimated: >60 mL/min (ref >60)
Glucose, Bld: 166 mg/dL — ABNORMAL HIGH (ref 70–99)
Potassium: 3.5 mmol/L (ref 3.5–5.1)
Sodium: 140 mmol/L (ref 135–145)
Total Bilirubin: 5.1 mg/dL — ABNORMAL HIGH (ref 0.0–1.2)
Total Protein: 6 g/dL — ABNORMAL LOW (ref 6.5–8.1)

## 2024-05-16 LAB — ACETAMINOPHEN LEVEL: Acetaminophen (Tylenol), Serum: <10 ug/mL — ABNORMAL LOW (ref 10–30)

## 2024-05-16 LAB — HEPATITIS PANEL, ACUTE
HCV Ab: NONREACTIVE
Hep A IgM: NONREACTIVE
Hep B C IgM: NONREACTIVE
Hepatitis B Surface Ag: NONREACTIVE

## 2024-05-16 LAB — GLUCOSE, CAPILLARY
Glucose-Capillary: 139 mg/dL — ABNORMAL HIGH (ref 70–99)
Glucose-Capillary: 134 mg/dL — ABNORMAL HIGH (ref 70–99)
Glucose-Capillary: 194 mg/dL — ABNORMAL HIGH (ref 70–99)

## 2024-05-16 LAB — CBG MONITORING, ED
Glucose-Capillary: 206 mg/dL — ABNORMAL HIGH (ref 70–99)
Glucose-Capillary: 245 mg/dL — ABNORMAL HIGH (ref 70–99)

## 2024-05-16 MED ORDER — ENOXAPARIN SODIUM 40 MG/0.4ML IJ SOSY
40.0000 mg | PREFILLED_SYRINGE | INTRAMUSCULAR | Status: DC
  Administered 2024-05-17: 40 mg via SUBCUTANEOUS
  Filled 2024-05-16: qty 0.4

## 2024-05-16 MED ORDER — PANTOPRAZOLE SODIUM 40 MG IV SOLR
40.0000 mg | Freq: Once | INTRAVENOUS | Status: AC
  Administered 2024-05-16: 40 mg via INTRAVENOUS
  Filled 2024-05-16: qty 10

## 2024-05-16 MED ORDER — PREDNISOLONE ACETATE 1 % OP SUSP
2.0000 [drp] | Freq: Four times a day (QID) | OPHTHALMIC | Status: DC
  Administered 2024-05-16: 2 [drp] via OPHTHALMIC
  Filled 2024-05-16: qty 5

## 2024-05-16 MED ORDER — SODIUM CHLORIDE 0.9 % IV SOLN: INTRAVENOUS | Status: AC

## 2024-05-16 MED ORDER — KETOROLAC TROMETHAMINE 0.5 % OP SOLN
1.0000 [drp] | Freq: Every morning | OPHTHALMIC | Status: DC
  Administered 2024-05-17 – 2024-05-18 (×2): 1 [drp] via OPHTHALMIC

## 2024-05-16 MED ORDER — BRIMONIDINE TARTRATE 0.2 % OP SOLN
1.0000 [drp] | Freq: Three times a day (TID) | OPHTHALMIC | Status: DC
  Administered 2024-05-16 – 2024-05-18 (×4): 1 [drp] via OPHTHALMIC

## 2024-05-16 MED ORDER — HYDROMORPHONE HCL 1 MG/ML IJ SOLN
0.5000 mg | INTRAMUSCULAR | Status: DC | PRN
  Administered 2024-05-16: 0.5 mg via INTRAVENOUS
  Filled 2024-05-16: qty 0.5

## 2024-05-16 MED ORDER — PREDNISOLONE ACETATE 1 % OP SUSP
2.0000 [drp] | Freq: Four times a day (QID) | OPHTHALMIC | Status: DC
  Administered 2024-05-16 – 2024-05-18 (×6): 2 [drp] via OPHTHALMIC

## 2024-05-16 MED ORDER — KETOROLAC TROMETHAMINE 0.5 % OP SOLN
1.0000 [drp] | Freq: Two times a day (BID) | OPHTHALMIC | Status: DC
  Administered 2024-05-16: 1 [drp] via OPHTHALMIC
  Filled 2024-05-16: qty 5

## 2024-05-16 MED ORDER — LABETALOL HCL 5 MG/ML IV SOLN
5.0000 mg | INTRAVENOUS | Status: DC | PRN
  Administered 2024-05-16: 5 mg via INTRAVENOUS
  Filled 2024-05-16: qty 4

## 2024-05-16 MED ORDER — ONDANSETRON HCL 4 MG/2ML IJ SOLN
4.0000 mg | Freq: Four times a day (QID) | INTRAMUSCULAR | Status: DC | PRN
  Administered 2024-05-16: 4 mg via INTRAVENOUS
  Filled 2024-05-16: qty 2

## 2024-05-16 MED ORDER — LACTATED RINGERS IV BOLUS
1000.0000 mL | Freq: Once | INTRAVENOUS | Status: AC
  Administered 2024-05-16: 1000 mL via INTRAVENOUS

## 2024-05-16 MED ORDER — KETOROLAC TROMETHAMINE 0.5 % OP SOLN: 1.0000 [drp] | Freq: Two times a day (BID) | OPHTHALMIC | Status: DC

## 2024-05-16 MED ORDER — INSULIN ASPART 100 UNIT/ML IJ SOLN
0.0000 [IU] | Freq: Three times a day (TID) | INTRAMUSCULAR | Status: DC
  Administered 2024-05-16: 1 [IU] via SUBCUTANEOUS
  Administered 2024-05-16: 3 [IU] via SUBCUTANEOUS
  Administered 2024-05-17: 2 [IU] via SUBCUTANEOUS
  Administered 2024-05-17: 1 [IU] via SUBCUTANEOUS
  Administered 2024-05-18: 3 [IU] via SUBCUTANEOUS
  Filled 2024-05-16 (×2): qty 1
  Filled 2024-05-16: qty 2
  Filled 2024-05-16: qty 1
  Filled 2024-05-16: qty 2
  Filled 2024-05-16: qty 3

## 2024-05-16 MED ORDER — NETARSUDIL-LATANOPROST 0.02-0.005 % OP SOLN
1.0000 [drp] | Freq: Every day | OPHTHALMIC | Status: DC
  Administered 2024-05-16 – 2024-05-17 (×2): 1 [drp] via OPHTHALMIC

## 2024-05-16 MED ORDER — DORZOLAMIDE HCL-TIMOLOL MAL 2-0.5 % OP SOLN
1.0000 [drp] | Freq: Two times a day (BID) | OPHTHALMIC | Status: DC
  Administered 2024-05-16 – 2024-05-18 (×4): 1 [drp] via OPHTHALMIC

## 2024-05-16 MED ORDER — DORZOLAMIDE HCL-TIMOLOL MAL 2-0.5 % OP SOLN
1.0000 [drp] | Freq: Two times a day (BID) | OPHTHALMIC | Status: DC
  Administered 2024-05-16: 1 [drp] via OPHTHALMIC
  Filled 2024-05-16: qty 10

## 2024-05-16 MED ORDER — HYDRALAZINE HCL 20 MG/ML IJ SOLN
10.0000 mg | INTRAMUSCULAR | Status: DC | PRN
  Administered 2024-05-16 – 2024-05-18 (×2): 10 mg via INTRAVENOUS
  Filled 2024-05-16 (×2): qty 1

## 2024-05-16 NOTE — ED Notes (Signed)
 Dr. Jerral informed of the patient's BP 206/79 via epic secure chat.

## 2024-05-16 NOTE — Assessment & Plan Note (Signed)
 Baseline mevacor  use  Will hold for now in setting of significant transaminitis

## 2024-05-16 NOTE — Assessment & Plan Note (Addendum)
 Baseline Metastatic hormone receptor-positive, HER2-negative breast cancer with liver involvement  Originally diagnosed 2019  Followed by Dr. Loretha outpatient  Noted recent liver biopsy 05/14/2024

## 2024-05-16 NOTE — Progress Notes (Signed)
 Venous duplex lower ext  has been completed. Refer to [facility] under chart review to view preliminary results.   05/16/2024  8:54 AM Dawan Farney, Ricka BIRCH

## 2024-05-16 NOTE — ED Notes (Addendum)
 Went to go update pt's vitals, but was unable to due to RN and dr being at bedside.

## 2024-05-16 NOTE — ED Notes (Signed)
 Provided patient with warm blanket and place patient back on the monitor after using th restroom.

## 2024-05-16 NOTE — Assessment & Plan Note (Signed)
 SBP 200s-160s  Will add on prn IV hydralazine  in setting of NPO status

## 2024-05-16 NOTE — Assessment & Plan Note (Addendum)
 Hyperbilirubinemia  LFTs markedly elevated on presentation  AST 913  ALT 969  T bili 5.7  S/p recent liver biopsy for metastatic liver lesion and secondary choledocholthiasis- suspect this is likely etiology of transaminitis with hyperbilrubinemia  Will add on acute hepatitis panel and tylenol  level to be complete  Dr. Albertus with GI notified per Dr. Deena note Will place formal consult order Pending ERCP  Follow

## 2024-05-16 NOTE — ED Notes (Signed)
Patient resting in bed with husband at bedside.

## 2024-05-16 NOTE — Assessment & Plan Note (Signed)
 Pt reports remote history of DVT in her 68s Denies any recurrence since then  Noted LE swelling on evaluation today- predominantly on R side Will check LE u/s x 1  Follow closely

## 2024-05-16 NOTE — Consult Note (Incomplete)
 Referring Provider: Dr. Elspeth Masters  Primary Care Physician:  Rollene Almarie LABOR, MD Primary Gastroenterologist:  Dr. Norleen Kiang   Reason for Consultation:   HPI: [name] is a 78 y.o. female with a past medical history of hypertension, hyperlipidemia, TIA, diabetes mellitus type 2, remote history of DVT, obstructive sleep apnea, glaucoma, recurrent breast cancer, uterine cancer and GERD.  She has a history of metastatic breast cancer initially diagnosed in 2019 s/p double right lumpectomies, chemo and adjuvant radiation, subsequently developed pulmonary and liver metastasis.  Previous treatments included Ibrance  and Letrozole , which were effective for several years.  She transitioned  GI PROCEDURES:  EGD 02/02/2024: 1. GERD complicated by esophagitis and peptic stricture status post dilation  2. Hiatal hernia  3. Mild gastroduodenitis. Status post biopsies.   Past Medical History:  Diagnosis Date   Breast cancer (HCC)    Cancer (HCC) 02/2016   right breast   DIABETES MELLITUS, TYPE II 01/04/2007   only takes actoplus daily   Dizziness and giddiness 02/29/2008   DVT, HX OF    at age 85 in right buttocks   Dyspnea    due to lung cancer   Family history of breast cancer    GERD 01/04/2007   pt reports resolved    GLAUCOMA 07/30/2008   both eyes   History of blood transfusion    no abnormal  reaction   History of uterine cancer 2000   hysterectomy done   HYPERLIPIDEMIA 01/04/2007   taking Pravastatin  daily   HYPERTENSION 01/04/2007   takes Lisinopril  daily   Joint pain    Joint swelling    Leg cramps    LEG PAIN, LEFT 07/06/2007   NUMBNESS 07/30/2008   in fingers;pt states from Diamox    OSTEOARTHRITIS, HIP 09/25/2009   OTITIS MEDIA, ACUTE, BILATERAL 02/29/2008   Overweight(278.02) 01/04/2007   Peripheral vascular disease    Personal history of radiation therapy 2018   Pneumonia    PONV (postoperative nausea and vomiting)    SLEEP APNEA, OBSTRUCTIVE    doesn't  use a cpap;study done about 38yrs ago   TRANSIENT ISCHEMIC ATTACK, HX OF 01/04/2007   Vision loss    left eye    Past Surgical History:  Procedure Laterality Date   ABDOMINAL HYSTERECTOMY  2000   BREAST LUMPECTOMY Right 01/13/2017   x2   BREAST LUMPECTOMY WITH RADIOACTIVE SEED AND SENTINEL LYMPH NODE BIOPSY Right 01/13/2017   Procedure: RIGHT BREAST RADIOACTIVE SEED X'S 2 GUIDED LUMPECTOMY WITH RADIOACTIVE SEED TARGETED AXILLARYLYMPH NODE EXCISION AND RIGHT AXILLARY SENTINEL LYMPH NODE BIOPSY;  Surgeon: Ethyl Lenis, MD;  Location: [facility] OR;  Service: General;  Laterality: Right;  2 SEEDS IN RIGHT BREAST 1 SEED IN RIGHT AXILLARY NODE   CHOLECYSTECTOMY     ENDOBRONCHIAL ULTRASOUND Bilateral 03/28/2016   Procedure: ENDOBRONCHIAL ULTRASOUND;  Surgeon: Lamar GORMAN Chris, MD;  Location: [facility];  Service: Cardiopulmonary;  Laterality: Bilateral;   EYE SURGERY  13   shunt left and lazer eye surgery on right cataract and retenia tear with repair   growth removal  2004   from thumb   KNEE ARTHROSCOPY Right    mulitple eye surgeries     both eyes, cataracts with ioc done both eyes   OOPHORECTOMY     right lumpectomy with axillary node dissection Right 01/2017   TOTAL HIP ARTHROPLASTY  06/24/2011   Procedure: TOTAL HIP ARTHROPLASTY;  Surgeon: Dempsey JINNY Sensor;  Location: [facility] OR;  Service: Orthopedics;  Laterality: Right;  TOTAL HIP ARTHROPLASTY Left 11/12/2012   Dr Liam   TOTAL HIP ARTHROPLASTY Left 11/12/2012   Procedure: TOTAL HIP ARTHROPLASTY;  Surgeon: Dempsey JINNY Liam, MD;  Location: [facility];  Service: Orthopedics;  Laterality: Left;  DEPUY PINNACLE    Prior to Admission medications   Medication Sig Start Date End Date Taking? Authorizing Provider  acetaminophen  (TYLENOL ) 500 MG tablet Take 1,000 mg by mouth every 4 (four) hours as needed for moderate pain or fever. Patient taking differently: Take 1,000 mg by mouth every 6 (six) hours as needed for moderate pain (pain score 4-6) or fever.     [provider]  aspirin  EC 81 MG tablet Take 81 mg by mouth daily at 6 PM. 1700    [provider]  Baclofen  5 MG TABS Take 0.5-1 tablets (2.5-5 mg total) by mouth 2 (two) times daily as needed. Patient not taking: Reported on 05/16/2024 04/29/24   Joyce, Britney F, FNP  Biotin 1 MG CAPS Take by mouth.    [provider]  brimonidine  (ALPHAGAN ) 0.2 % ophthalmic solution INSTILL 1 DROP INTO EACH EYE THREE TIMES DAILY 11/23/20   [provider]  cholecalciferol (VITAMIN D) 1000 units tablet Take 1,000 Units by mouth daily.    [provider]  dorzolamide-timolol  (COSOPT) 22.3-6.8 MG/ML ophthalmic solution Place 1 drop into both eyes 2 (two) times daily. 12/20/16   [provider]  gabapentin  (NEURONTIN ) 100 MG capsule Take 200 mg at bedtime, can increase to 300 mg at bedtime if tolerated 12/13/22   Geofm Glade JINNY, MD  ketorolac (ACULAR) 0.5 % ophthalmic solution Place 1 drop into the right eye 2 (two) times daily. 11/29/22   [provider]  lisinopril  (ZESTRIL ) 5 MG tablet Take 1 tablet by mouth once daily 03/18/24   Rollene Almarie LABOR, MD  lovastatin  (MEVACOR ) 20 MG tablet TAKE 1 TABLET BY MOUTH AT BEDTIME 01/22/24   Rollene Almarie LABOR, MD  meloxicam  (MOBIC ) 15 MG tablet Take 1 tablet (15 mg total) by mouth daily. Take with food 12/13/22   Geofm Glade JINNY, MD  Netarsudil-Latanoprost (ROCKLATAN ) 0.02-0.005 % SOLN Apply to eye. 06/30/20   Magrinat, Sandria BROCKS, MD  ondansetron  (ZOFRAN -ODT) 4 MG disintegrating tablet Take 1 tablet (4 mg total) by mouth every 8 (eight) hours as needed for nausea or vomiting. 04/24/24   Iruku, Praveena, MD  palbociclib  (IBRANCE ) 75 MG tablet Take 1 tablet (75 mg total) by mouth daily. Take for 21 days on, 7 days off, repeat every 28 days. 06/26/23   Iruku, Praveena, MD  pantoprazole  (PROTONIX ) 40 MG tablet Take 1 tablet (40 mg total) by mouth daily. 02/02/24   Abran Norleen SAILOR, MD  pioglitazone -metformin  (ACTOPLUS MET )  15-850 MG tablet Take 1 tablet by mouth daily. 09/21/23   Rollene Almarie LABOR, MD  prednisoLONE  acetate (PRED FORTE ) 1 % ophthalmic suspension SMARTSIG:1 In Eye(s) 6 Times Daily 01/16/20   [provider]    Current Facility-Administered Medications  Medication Dose Route Frequency Provider Last Rate Last Admin   0.9 %  sodium chloride  infusion   Intravenous Continuous Eldonna Elspeth JINNY, MD 100 mL/hr at 05/16/24 0919 Restarted at 05/16/24 0919   brimonidine  (ALPHAGAN ) 0.2 % ophthalmic solution 1 drop  1 drop Both Eyes TID Carolee Rosaline DASEN, RPH       dorzolamide-timolol  (COSOPT) 2-0.5 % ophthalmic solution 1 drop  1 drop Both Eyes BID Carolee Rosaline T, RPH       hydrALAZINE  (APRESOLINE ) injection 10 mg  10  mg Intravenous Q4H PRN Eldonna Elspeth PARAS, MD       HYDROmorphone  (DILAUDID ) injection 0.5 mg  0.5 mg Intravenous Q3H PRN Eldonna Elspeth PARAS, MD       insulin  aspart (novoLOG ) injection 0-9 Units  0-9 Units Subcutaneous TID WC Eldonna Elspeth PARAS, MD   1 Units at 05/16/24 1316   [START ON 05/17/2024] ketorolac (ACULAR) 0.5 % ophthalmic solution 1 drop  1 drop Right Eye q morning Eldonna Elspeth PARAS, MD       labetalol  (NORMODYNE ) injection 5 mg  5 mg Intravenous Q2H PRN Jerral Meth, MD   5 mg at 05/16/24 0106   Netarsudil-Latanoprost 0.02-0.005 % SOLN 1 drop  1 drop Ophthalmic QHS Carolee Browning T, RPH       prednisoLONE  acetate (PRED FORTE ) 1 % ophthalmic suspension 2 drop  2 drop Both Eyes QID Carolee Browning DASEN, RPH   2 drop at 05/16/24 1618    Allergies as of 05/15/2024 - Review Complete 05/15/2024  Allergen Reaction Noted   Codeine  Hives 05/13/2016   Fluorescein  Nausea And Vomiting 12/11/2014   Oxycodone  Nausea And Vomiting 11/12/2012   Lipitor [atorvastatin  calcium ] Other (See Comments) 11/10/2010   Sitagliptin phosphate Nausea And Vomiting 09/25/2009   Sulfa drugs cross reactors Nausea And Vomiting 06/09/2011    Family History  Problem Relation Age of Onset   Breast cancer  Mother 80   Dementia Mother    Cancer Mother        Breast and lung cancer   Stroke Sister    Breast cancer Sister 71   Heart attack Maternal Aunt    Lung cancer Maternal Grandmother        non smoker   Glaucoma Maternal Grandfather    Anesthesia problems Neg Hx    Colon cancer Neg Hx    Esophageal cancer Neg Hx    Rectal cancer Neg Hx    Stomach cancer Neg Hx     Social History   Socioeconomic History   Marital status: Married    Spouse name: Debby   Number of children: Not on file   Years of education: Not on file   Highest education level: Not on file  Occupational History   Occupation: physiological scientist    Employer: [employer]   Occupation: retired  Tobacco Use   Smoking status: Never   Smokeless tobacco: Never  Vaping Use   Vaping status: Never Used  Substance and Sexual Activity   Alcohol use: No   Drug use: No   Sexual activity: Not Currently  Other Topics Concern   Not on file  Social History Narrative   Lives with husband/2025   Social Drivers of Health   Financial Resource Strain: Low Risk  (02/06/2023)   Overall Financial Resource Strain (CARDIA)    Difficulty of Paying Living Expenses: Not hard at all  Food Insecurity: No Food Insecurity (05/16/2024)   Hunger Vital Sign    Worried About Running Out of Food in the Last Year: Never true    Ran Out of Food in the Last Year: Never true  Transportation Needs: No Transportation Needs (05/16/2024)   PRAPARE - Administrator, Civil Service (Medical): No    Lack of Transportation (Non-Medical): No  Physical Activity: Inactive (05/02/2024)   Exercise Vital Sign    Days of Exercise per Week: 0 days    Minutes of Exercise per Session: 0 min  Stress: No Stress Concern Present (05/02/2024)   [employer] of  Occupational Health - Occupational Stress Questionnaire    Feeling of Stress: Only a little  Social Connections: Socially Integrated (05/16/2024)   Social Connection and  Isolation Panel    Frequency of Communication with Friends and Family: More than three times a week    Frequency of Social Gatherings with Friends and Family: Three times a week    Attends Religious Services: More than 4 times per year    Active Member of Clubs or Organizations: Yes    Attends Banker Meetings: Never    Marital Status: Married  Catering Manager Violence: Not At Risk (05/16/2024)   Humiliation, Afraid, Rape, and Kick questionnaire    Fear of Current or Ex-Partner: No    Emotionally Abused: No    Physically Abused: No    Sexually Abused: No    Review of Systems: Gen: Denies fever, sweats or chills. No weight loss.  CV: Denies chest pain, palpitations or edema. Resp: Denies cough, shortness of breath of hemoptysis.  GI: Denies heartburn, dysphagia, stomach or lower abdominal pain. No diarrhea or constipation. No rectal bleeding or melena.   GU : Denies urinary burning, blood in urine, increased urinary frequency or incontinence. MS: Denies joint pain, muscles aches or weakness. Derm: Denies rash, itchiness, skin lesions or unhealing ulcers. Psych: Denies depression, anxiety, memory loss or confusion. Heme: Denies easy bruising, bleeding. Neuro:  Denies headaches, dizziness or paresthesias. Endo:  Denies any problems with DM, thyroid  or adrenal function.  Physical Exam: Vital signs in last 24 hours: Temp:  [97.7 F (36.5 C)-98.9 F (37.2 C)] 98.3 F (36.8 C) (12/04 1407) Pulse Rate:  [96-128] 96 (12/04 1407) Resp:  [12-21] 18 (12/04 1407) BP: (123-249)/(55-106) 168/62 (12/04 1407) SpO2:  [77 %-98 %] 77 % (12/04 1407) Weight:  [104 kg] 104 kg (12/03 1815) Last BM Date : 05/13/24 General:  Alert, well-developed, well-nourished, pleasant and cooperative in NAD. Head:  Normocephalic and atraumatic. Eyes:  No scleral icterus. Conjunctiva pink. Ears:  Normal auditory acuity. Nose:  No deformity, discharge or lesions. Mouth:  Dentition intact. No ulcers  or lesions.  Neck:  Supple. No lymphadenopathy or thyromegaly.  Lungs: Breath sounds clear throughout. No wheezes, rhonchi or crackles.  Heart:   Abdomen:   Rectal: Deferred. Musculoskeletal:  Symmetrical without gross deformities.  Pulses:  Normal pulses noted. Extremities:  Without clubbing or edema. Neurologic:  Alert and  oriented x 4. No focal deficits.  Skin:  Intact without significant lesions or rashes. Psych:  Alert and cooperative. Normal mood and affect.  Intake/Output from previous day: No intake/output data recorded. Intake/Output this shift: Total I/O In: 471 [I.V.:471] Out: 200 [Urine:200]  Lab Results: Recent Labs    05/14/24 1115 05/15/24 1853  WBC 2.3* 4.2  HGB 11.9* 13.8  HCT 35.3* 39.4  PLT 183 184   BMET Recent Labs    05/15/24 1853  NA 138  K 3.8  CL 100  CO2 24  GLUCOSE 279*  BUN 9  CREATININE 0.77  CALCIUM  9.4   LFT Recent Labs    05/15/24 1853  PROT 7.6  ALBUMIN  4.6  AST 913*  ALT 969*  ALKPHOS 127*  BILITOT 5.7*   PT/INR Recent Labs    05/14/24 1115  LABPROT 14.6  INR 1.1   Hepatitis Panel Recent Labs    05/16/24 0858  HEPBSAG NON REACTIVE  HCVAB NON REACTIVE  HEPAIGM NON REACTIVE  HEPBIGM NON REACTIVE      Studies/Results: VAS US  LOWER EXTREMITY VENOUS (DVT)  Result Date: 05/16/2024  Lower Venous DVT Study Patient Name:  [name]  Date of Exam:   05/16/2024 Medical Rec #: [id number]      Accession #:    [id number] Date of Birth: Apr 09, 1946       Patient Gender: F Patient Age:   26 years Exam Location:  [facility] Procedure:      VAS US  LOWER EXTREMITY VENOUS (DVT) Referring Phys: ELSPETH NEWTON --------------------------------------------------------------------------------  Indications: Swelling, and Chronic swelling in both leg. Right worse than left.  Comparison Study: No priors. Performing Technologist: Ricka Sturdivant-Jones RDMS, RVT  Examination Guidelines: A complete evaluation includes B-mode  imaging, spectral Doppler, color Doppler, and power Doppler as needed of all accessible portions of each vessel. Bilateral testing is considered an integral part of a complete examination. Limited examinations for reoccurring indications may be performed as noted. The reflux portion of the exam is performed with the patient in reverse Trendelenburg.  +---------+---------------+---------+-----------+----------+--------------+ RIGHT    CompressibilityPhasicitySpontaneityPropertiesThrombus Aging +---------+---------------+---------+-----------+----------+--------------+ CFV      Full           Yes      Yes                                 +---------+---------------+---------+-----------+----------+--------------+ SFJ      Full                                                        +---------+---------------+---------+-----------+----------+--------------+ FV Prox  Full                                                        +---------+---------------+---------+-----------+----------+--------------+ FV Mid   Full           Yes      Yes                                 +---------+---------------+---------+-----------+----------+--------------+ FV DistalFull                                                        +---------+---------------+---------+-----------+----------+--------------+ PFV      Full                                                        +---------+---------------+---------+-----------+----------+--------------+ POP      Full           Yes      Yes                                 +---------+---------------+---------+-----------+----------+--------------+ PTV      Full                                                        +---------+---------------+---------+-----------+----------+--------------+  PERO     Full                                                        +---------+---------------+---------+-----------+----------+--------------+    +---------+---------------+---------+-----------+----------+--------------+ LEFT     CompressibilityPhasicitySpontaneityPropertiesThrombus Aging +---------+---------------+---------+-----------+----------+--------------+ CFV      Full           Yes      Yes                                 +---------+---------------+---------+-----------+----------+--------------+ SFJ      Full                                                        +---------+---------------+---------+-----------+----------+--------------+ FV Prox  Full                                                        +---------+---------------+---------+-----------+----------+--------------+ FV Mid   Full           Yes      Yes                                 +---------+---------------+---------+-----------+----------+--------------+ FV DistalFull                                                        +---------+---------------+---------+-----------+----------+--------------+ PFV      Full                                                        +---------+---------------+---------+-----------+----------+--------------+ POP      Full           Yes      Yes                                 +---------+---------------+---------+-----------+----------+--------------+ PTV      Full                                                        +---------+---------------+---------+-----------+----------+--------------+ PERO     Full                                                        +---------+---------------+---------+-----------+----------+--------------+  Summary: BILATERAL: - No evidence of deep vein thrombosis seen in the lower extremities, bilaterally. -No evidence of popliteal cyst, bilaterally.   *See table(s) above for measurements and observations. Electronically signed by Debby Robertson on 05/16/2024 at 2:17:49 PM.    Final    US  Abdomen Limited RUQ (LIVER/GB) Result Date:  05/15/2024 CLINICAL DATA:  Right upper quadrant EXAM: ULTRASOUND ABDOMEN LIMITED RIGHT UPPER QUADRANT COMPARISON:  CT abdomen 05/15/2024 FINDINGS: Gallbladder: Surgically absent. Common bile duct: Diameter: Common bile duct is dilated measuring 2.1 cm. No definitive common bile duct stone seen, although evaluation of the distal common bile duct is limited secondary to overlying bowel gas. Liver: No focal lesion identified. There is increase in parenchymal echogenicity. Portal vein is patent on color Doppler imaging with normal direction of blood flow towards the liver. Other: None. IMPRESSION: 1. Dilated common bile duct measuring up to 2.1 cm. No definitive common bile duct stone seen, although evaluation of the distal common bile duct is limited secondary to overlying bowel gas. Consider further evaluation with MRCP. 2. Hepatic steatosis. Electronically Signed   By: Greig Pique M.D.   On: 05/15/2024 20:29   CT ABDOMEN W WO CONTRAST Result Date: 05/15/2024 CLINICAL DATA:  Liver biopsy yesterday with new onset pain and vomiting. EXAM: CT ABDOMEN WITHOUT AND WITH CONTRAST TECHNIQUE: Multidetector CT imaging of the abdomen was performed following the standard protocol before and following the bolus administration of intravenous contrast. RADIATION DOSE REDUCTION: This exam was performed according to the departmental dose-optimization program which includes automated exposure control, adjustment of the mA and/or kV according to patient size and/or use of iterative reconstruction technique. CONTRAST:  100mL ISOVUE -300 IOPAMIDOL  (ISOVUE -300) INJECTION 61% COMPARISON:  CT biopsy images obtained yesterday 05/14/2024; recent prior CT scan of the chest, abdomen and pelvis 03/11/2024. FINDINGS: Lower chest: No acute abnormality. Hepatobiliary: Hypoechoic solid lesion in the subcapsular and anterior aspect of segment 5 is essentially unchanged at 1.8 x 1.6 cm. No evidence of hemorrhage. Surgical changes of prior  cholecystectomy with significant intra and extrahepatic biliary ductal dilatation which appears unchanged. High attenuation stone in the distal common bile duct consistent with choledocholithiasis. Pancreas: Unremarkable. No pancreatic ductal dilatation or surrounding inflammatory changes. Spleen: Normal in size without focal abnormality. Adrenals/Urinary Tract: Normal left adrenal gland. Stable 1.2 cm left adrenal nodule. Complex cyst in the upper pole of the right kidney measures 3.2 cm and demonstrates internal Hounsfield units of approximately 40. This is similar to the unenhanced CT images from 03/11/2024 consistent with a hemorrhagic or proteinaceous cyst rather than an enhancing lesion. No definite enhancing lesion identified. Stomach/Bowel: No focal bowel wall thickening or evidence of obstruction. Vascular/Lymphatic: Scattered calcified atherosclerotic plaque. No evidence of aneurysm. No suspicious lymphadenopathy. Other: Surgical changes in the right breast. Small fat containing ventral hernia. No evidence of ascites. Musculoskeletal: No acute fracture or aggressive appearing lytic or blastic osseous lesion. IMPRESSION: 1. No evidence of hemorrhage or other complication related to the recent liver biopsy. 2. Similar appearance of severe intra and extrahepatic biliary ductal dilatation with associated choledocholithiasis. Recommend referral to gastroenterology if not previously obtained. 3. Additional ancillary findings as above without interval change. Electronically Signed   By: Wilkie Lent M.D.   On: 05/15/2024 16:04    IMPRESSION/PLAN:   [name]  05/16/2024, 5:20 PM

## 2024-05-16 NOTE — ED Notes (Signed)
 Dr. Jerral at bedside providing an update.

## 2024-05-16 NOTE — Assessment & Plan Note (Signed)
 Blood sugar 270s  SSI  A1C  Monitor

## 2024-05-16 NOTE — Assessment & Plan Note (Signed)
 Progressive abdominal pain, nausea and vomiting with noted Dilated common bile duct measuring up to 2.1 cm on RUQ u/s without definitive stone s/p recent liver biopsy in setting of metastatic breast cancer with liver lesions  AST 913  ALT 969  T bili 5.7  In review in Dr. Bernardo note, [facility] notified- Dr. Albertus tentatively planning for ERCP  Will otherwise keep NPO  Pain control  Follow up further GI recommendations

## 2024-05-16 NOTE — H&P (Addendum)
 [facility] Telemedicine History and Physical  Referring Provider: Prentice Medicus MD  Telemedicine Provider: Elspeth Masters MD  Provider Location: [facility] [place]  Patient Location: [facility]  Referring Diagnosis: choledocholithiasis  Patient Name and DOB verified: yes Patient consented to Telemedicine Evaluation:yes  RN virtual assistant: Jaylen Davis RN  Video encounter time and date:21/09/2023 8:30 am    Patient: [name] FMW:[id number] DOB: Apr 13, 1946 DOA: 05/15/2024 DOS: the patient was seen and examined on 05/16/2024 PCP: Rollene Almarie LABOR, MD  Patient coming from: Home  Chief Complaint:  Chief Complaint  Patient presents with   Nausea   Emesis   HPI: [name] is a 78 y.o. female with medical history significant of metastatic breast cancer, type 2 diabetes, GERD, HTN, HLD, remote history of DVT presenting with transaminitis, choledocholithiasis, abdominal pain.  Patient noted to be following Dr. Loretha with hematology for metastatic hormone receptor positive, HER2 negative breast cancer with liver involvement.  Patient was referred to the interventional radiology for liver biopsy in the setting of active liver lesion.  Liver biopsy was performed on December 2.  Patient reports excruciating abdominal pain predominantly in the right upper quadrant right after the biopsy.  Has had decreased p.o. intake.  Nausea and vomiting nbnb. Pain at home has progressively worsened and become unbearable. No fevers or chills. No chest pain. No focal hemiparesis or confusion. No diarrhea. [facility] radiology followed up with patient on 05/15/24 with imaging showing markedly dilated common bile duct and recommendation to come to the ER for further evaluation.  Presented to the ER afebrile, heart rate in 100s, respirations in the teens, blood pressure 160s to 200s over 70s to 100s, satting well on room air.  White count 4.2, hemoglobin 13.9, platelets 184.  Creatinine 0.77.  AST 13, ALT 969, Phos 127.  T. bili 5.7.   Blood sugar in 200s.  CT of the abdomen pelvis with and without contrast showing extrahepatic biliary ductal dilatation concerning for choledocholithiasis.  In review of Dr. Deena note, case was discussed [facility] on call last night w/ (Dr. Francies group per signout) with plan for ERCP in the next 24 hours.   Review of Systems: As mentioned in the history of present illness. All other systems reviewed and are negative. Past Medical History:  Diagnosis Date   Breast cancer (HCC)    Cancer (HCC) 02/2016   right breast   DIABETES MELLITUS, TYPE II 01/04/2007   only takes actoplus daily   Dizziness and giddiness 02/29/2008   DVT, HX OF    at age 60 in right buttocks   Dyspnea    due to lung cancer   Family history of breast cancer    GERD 01/04/2007   pt reports resolved    GLAUCOMA 07/30/2008   both eyes   History of blood transfusion    no abnormal  reaction   History of uterine cancer 2000   hysterectomy done   HYPERLIPIDEMIA 01/04/2007   taking Pravastatin  daily   HYPERTENSION 01/04/2007   takes Lisinopril  daily   Joint pain    Joint swelling    Leg cramps    LEG PAIN, LEFT 07/06/2007   NUMBNESS 07/30/2008   in fingers;pt states from Diamox    OSTEOARTHRITIS, HIP 09/25/2009   OTITIS MEDIA, ACUTE, BILATERAL 02/29/2008   Overweight(278.02) 01/04/2007   Peripheral vascular disease    Personal history of radiation therapy 2018   Pneumonia    PONV (postoperative nausea and vomiting)    SLEEP APNEA, OBSTRUCTIVE  doesn't use a cpap;study done about 70yrs ago   TRANSIENT ISCHEMIC ATTACK, HX OF 01/04/2007   Vision loss    left eye   Past Surgical History:  Procedure Laterality Date   ABDOMINAL HYSTERECTOMY  2000   BREAST LUMPECTOMY Right 01/13/2017   x2   BREAST LUMPECTOMY WITH RADIOACTIVE SEED AND SENTINEL LYMPH NODE BIOPSY Right 01/13/2017   Procedure: RIGHT BREAST RADIOACTIVE SEED X'S 2 GUIDED LUMPECTOMY WITH RADIOACTIVE SEED TARGETED AXILLARYLYMPH NODE EXCISION AND RIGHT  AXILLARY SENTINEL LYMPH NODE BIOPSY;  Surgeon: Ethyl Lenis, MD;  Location: [facility] OR;  Service: General;  Laterality: Right;  2 SEEDS IN RIGHT BREAST 1 SEED IN RIGHT AXILLARY NODE   CHOLECYSTECTOMY     ENDOBRONCHIAL ULTRASOUND Bilateral 03/28/2016   Procedure: ENDOBRONCHIAL ULTRASOUND;  Surgeon: Lamar GORMAN Chris, MD;  Location: [facility] ENDOSCOPY;  Service: Cardiopulmonary;  Laterality: Bilateral;   EYE SURGERY  13   shunt left and lazer eye surgery on right cataract and retenia tear with repair   growth removal  2004   from thumb   KNEE ARTHROSCOPY Right    mulitple eye surgeries     both eyes, cataracts with ioc done both eyes   OOPHORECTOMY     right lumpectomy with axillary node dissection Right 01/2017   TOTAL HIP ARTHROPLASTY  06/24/2011   Procedure: TOTAL HIP ARTHROPLASTY;  Surgeon: Dempsey JINNY Sensor;  Location: [facility] OR;  Service: Orthopedics;  Laterality: Right;   TOTAL HIP ARTHROPLASTY Left 11/12/2012   Dr Sensor   TOTAL HIP ARTHROPLASTY Left 11/12/2012   Procedure: TOTAL HIP ARTHROPLASTY;  Surgeon: Dempsey JINNY Sensor, MD;  Location: [facility] OR;  Service: Orthopedics;  Laterality: Left;  DEPUY PINNACLE   Social History:  reports that she has never smoked. She has never used smokeless tobacco. She reports that she does not drink alcohol and does not use drugs.  Allergies  Allergen Reactions   Codeine  Hives    Hycodan syrup   Fluorescein  Nausea And Vomiting    ? IV dye for retina specialist   Oxycodone  Nausea And Vomiting    Patient vomited for 3 days after taking   Lipitor [Atorvastatin  Calcium ] Other (See Comments)    Leg cramp   Sitagliptin Phosphate Nausea And Vomiting   Sulfa Drugs Cross Reactors Nausea And Vomiting    Family History  Problem Relation Age of Onset   Breast cancer Mother 47   Dementia Mother    Cancer Mother        Breast and lung cancer   Stroke Sister    Breast cancer Sister 23   Heart attack Maternal Aunt    Lung cancer Maternal Grandmother        non smoker   Glaucoma  Maternal Grandfather    Anesthesia problems Neg Hx    Colon cancer Neg Hx    Esophageal cancer Neg Hx    Rectal cancer Neg Hx    Stomach cancer Neg Hx     Prior to Admission medications   Medication Sig Start Date End Date Taking? Authorizing Provider  acetaminophen  (TYLENOL ) 500 MG tablet Take 1,000 mg by mouth every 4 (four) hours as needed for moderate pain or fever. Patient taking differently: Take 1,000 mg by mouth every 6 (six) hours as needed for moderate pain (pain score 4-6) or fever.    [provider]  aspirin  EC 81 MG tablet Take 81 mg by mouth daily at 6 PM. 1700    [provider]  Baclofen  5 MG TABS  Take 0.5-1 tablets (2.5-5 mg total) by mouth 2 (two) times daily as needed. 04/29/24   Merlynn Niki FALCON, FNP  Biotin 1 MG CAPS Take by mouth.    [provider]  brimonidine  (ALPHAGAN ) 0.2 % ophthalmic solution INSTILL 1 DROP INTO EACH EYE THREE TIMES DAILY 11/23/20   [provider]  cholecalciferol (VITAMIN D) 1000 units tablet Take 1,000 Units by mouth daily.    [provider]  dorzolamide -timolol  (COSOPT ) 22.3-6.8 MG/ML ophthalmic solution Place 1 drop into both eyes 2 (two) times daily. 12/20/16   [provider]  gabapentin  (NEURONTIN ) 100 MG capsule Take 200 mg at bedtime, can increase to 300 mg at bedtime if tolerated 12/13/22   Geofm Glade PARAS, MD  ketorolac  (ACULAR ) 0.5 % ophthalmic solution Place 1 drop into the right eye 2 (two) times daily. 11/29/22   [provider]  lisinopril  (ZESTRIL ) 5 MG tablet Take 1 tablet by mouth once daily 03/18/24   Rollene Almarie LABOR, MD  lovastatin  (MEVACOR ) 20 MG tablet TAKE 1 TABLET BY MOUTH AT BEDTIME 01/22/24   Rollene Almarie LABOR, MD  meloxicam  (MOBIC ) 15 MG tablet Take 1 tablet (15 mg total) by mouth daily. Take with food 12/13/22   Geofm Glade PARAS, MD  Netarsudil -Latanoprost  (ROCKLATAN ) 0.02-0.005 % SOLN Apply to eye. 06/30/20   Magrinat, Sandria BROCKS, MD  ondansetron  (ZOFRAN -ODT)  4 MG disintegrating tablet Take 1 tablet (4 mg total) by mouth every 8 (eight) hours as needed for nausea or vomiting. 04/24/24   Iruku, Praveena, MD  palbociclib  (IBRANCE ) 75 MG tablet Take 1 tablet (75 mg total) by mouth daily. Take for 21 days on, 7 days off, repeat every 28 days. 06/26/23   Iruku, Praveena, MD  pantoprazole  (PROTONIX ) 40 MG tablet Take 1 tablet (40 mg total) by mouth daily. 02/02/24   Abran Norleen SAILOR, MD  pioglitazone -metformin  (ACTOPLUS MET ) 15-850 MG tablet Take 1 tablet by mouth daily. 09/21/23   Rollene Almarie LABOR, MD  prednisoLONE  acetate (PRED FORTE ) 1 % ophthalmic suspension SMARTSIG:1 In Eye(s) 6 Times Daily 01/16/20   [provider]    Physical Exam: Vitals:   05/16/24 0355 05/16/24 0759 05/16/24 0800 05/16/24 0921  BP: (!) 164/74 (!) 171/66 (!) 171/66 (!) 155/65  Pulse: (!) 108 (!) 120 (!) 122 (!) 110  Resp: 16 18  18   Temp: 98.4 F (36.9 C)  98.5 F (36.9 C) 98.7 F (37.1 C)  TempSrc: Oral Oral Oral Oral  SpO2: 96% 98% 96% 96%  Weight:      Height:       Physical Exam Vitals reviewed. Nursing note reviewed: Nursing assisted exam including pulmonary, cardiac, abdominal and skin assessment. Constitutional:      Appearance: She is obese.  HENT:     Head: Normocephalic and atraumatic.     Nose: Nose normal.     Mouth/Throat:     Mouth: Mucous membranes are moist.  Eyes:     Pupils: Pupils are equal, round, and reactive to light.  Cardiovascular:     Rate and Rhythm: Normal rate and regular rhythm.  Pulmonary:     Effort: Pulmonary effort is normal.  Abdominal:     General: Bowel sounds are normal.  Musculoskeletal:        General: Normal range of motion.  Neurological:     General: No focal deficit present.  Psychiatric:        Mood and Affect: Mood normal.     Data Reviewed:  There are no  new results to review at this time.  VAS US  LOWER EXTREMITY VENOUS (DVT)  Lower Venous DVT Study  Patient Name:  HOLLYE PRITT  Date of Exam:    05/16/2024 Medical Rec #: [id number]      Accession #:    [id number] Date of Birth: 28-Jul-1945       Patient Gender: F Patient Age:   18 years Exam Location:  [facility] Procedure:      VAS US  LOWER EXTREMITY VENOUS (DVT) Referring Phys: ELSPETH Tenicia Gural  --------------------------------------------------------------------------------   Indications: Swelling, and Chronic swelling in both leg. Right worse than left.   Comparison Study: No priors.  Performing Technologist: Ricka Sturdivant-Jones RDMS, RVT    Examination Guidelines: A complete evaluation includes B-mode imaging, spectral Doppler, color Doppler, and power Doppler as needed of all accessible portions of each vessel. Bilateral testing is considered an integral part of a complete examination. Limited examinations for reoccurring indications may be performed as noted. The reflux portion of the exam is performed with the patient in reverse Trendelenburg.     +---------+---------------+---------+-----------+----------+--------------+ RIGHT    CompressibilityPhasicitySpontaneityPropertiesThrombus Aging +---------+---------------+---------+-----------+----------+--------------+ CFV      Full           Yes      Yes                                 +---------+---------------+---------+-----------+----------+--------------+ SFJ      Full                                                        +---------+---------------+---------+-----------+----------+--------------+ FV Prox  Full                                                        +---------+---------------+---------+-----------+----------+--------------+ FV Mid   Full           Yes      Yes                                 +---------+---------------+---------+-----------+----------+--------------+ FV DistalFull                                                         +---------+---------------+---------+-----------+----------+--------------+ PFV      Full                                                        +---------+---------------+---------+-----------+----------+--------------+ POP      Full           Yes      Yes                                 +---------+---------------+---------+-----------+----------+--------------+  PTV      Full                                                        +---------+---------------+---------+-----------+----------+--------------+ PERO     Full                                                        +---------+---------------+---------+-----------+----------+--------------+        +---------+---------------+---------+-----------+----------+--------------+ LEFT     CompressibilityPhasicitySpontaneityPropertiesThrombus Aging +---------+---------------+---------+-----------+----------+--------------+ CFV      Full           Yes      Yes                                 +---------+---------------+---------+-----------+----------+--------------+ SFJ      Full                                                        +---------+---------------+---------+-----------+----------+--------------+ FV Prox  Full                                                        +---------+---------------+---------+-----------+----------+--------------+ FV Mid   Full           Yes      Yes                                 +---------+---------------+---------+-----------+----------+--------------+ FV DistalFull                                                        +---------+---------------+---------+-----------+----------+--------------+ PFV      Full                                                        +---------+---------------+---------+-----------+----------+--------------+ POP      Full           Yes      Yes                                  +---------+---------------+---------+-----------+----------+--------------+ PTV      Full                                                        +---------+---------------+---------+-----------+----------+--------------+  PERO     Full                                                        +---------+---------------+---------+-----------+----------+--------------+             Summary: BILATERAL: - No evidence of deep vein thrombosis seen in the lower extremities, bilaterally. -No evidence of popliteal cyst, bilaterally.       *See table(s) above for measurements and observations.  Electronically signed by Debby Robertson on 05/16/2024 at 2:17:49 PM.      Final    Lab Results  Component Value Date   WBC 4.2 05/15/2024   HGB 13.8 05/15/2024   HCT 39.4 05/15/2024   MCV 93.6 05/15/2024   PLT 184 05/15/2024   Last metabolic panel Lab Results  Component Value Date   GLUCOSE 279 (H) 05/15/2024   NA 138 05/15/2024   K 3.8 05/15/2024   CL 100 05/15/2024   CO2 24 05/15/2024   BUN 9 05/15/2024   CREATININE 0.77 05/15/2024   GFRNONAA >60 05/15/2024   CALCIUM  9.4 05/15/2024   PROT 7.6 05/15/2024   ALBUMIN  4.6 05/15/2024   BILITOT 5.7 (H) 05/15/2024   ALKPHOS 127 (H) 05/15/2024   AST 913 (H) 05/15/2024   ALT 969 (H) 05/15/2024   ANIONGAP 14 05/15/2024    Assessment and Plan: * Choledocholithiasis Progressive abdominal pain, nausea and vomiting with noted Dilated common bile duct measuring up to 2.1 cm on RUQ u/s without definitive stone s/p recent liver biopsy in setting of metastatic breast cancer with liver lesions  AST 913  ALT 969  T bili 5.7  In review in Dr. Bernardo note, [facility] notified- Dr. Albertus tentatively planning for ERCP  Will otherwise keep NPO  Pain control  Follow up further GI recommendations   Transaminitis Hyperbilirubinemia  LFTs markedly elevated on presentation  AST 913  ALT 969  T bili 5.7  S/p recent liver biopsy  for metastatic liver lesion and secondary choledocholthiasis- suspect this is likely etiology of transaminitis with hyperbilrubinemia  Will add on acute hepatitis panel and tylenol  level to be complete  Dr. Albertus with GI notified per Dr. Deena note Will place formal consult order Pending ERCP  Follow   Malignant neoplasm of upper-outer quadrant of right breast in female, estrogen receptor positive (HCC) Baseline Metastatic hormone receptor-positive, HER2-negative breast cancer with liver involvement  Originally diagnosed 2019  Followed by Dr. Loretha outpatient  Noted recent liver biopsy 05/14/2024    DVT, HX OF Pt reports remote history of DVT in her 53s Denies any recurrence since then  Noted LE swelling on evaluation today- predominantly on R side Will check LE u/s x 1  Follow closely  Essential hypertension SBP 200s-160s  Will add on prn IV hydralazine  in setting of NPO status    Hyperlipidemia associated with type 2 diabetes mellitus (HCC) Baseline mevacor  use  Will hold for now in setting of significant transaminitis    Diabetes (HCC) Blood sugar 270s  SSI  A1C  Monitor        Advance Care Planning:   Code Status: Prior   Consults: GI   Family Communication: No family at the bedside   Severity of Illness: The appropriate patient status for this patient is INPATIENT. Inpatient status is judged to be reasonable and  necessary in order to provide the required intensity of service to ensure the patient's safety. The patient's presenting symptoms, physical exam findings, and initial radiographic and laboratory data in the context of their chronic comorbidities is felt to place them at high risk for further clinical deterioration. Furthermore, it is not anticipated that the patient will be medically stable for discharge from the hospital within 2 midnights of admission.   * I certify that at the point of admission it is my clinical judgment that the patient will  require inpatient hospital care spanning beyond 2 midnights from the point of admission due to high intensity of service, high risk for further deterioration and high frequency of surveillance required.*  Author: Elspeth JINNY Masters, MD 05/16/2024 1:04 PM  For on call review [email or web address].

## 2024-05-17 ENCOUNTER — Inpatient Hospital Stay (HOSPITAL_COMMUNITY): Admitting: Anesthesiology

## 2024-05-17 ENCOUNTER — Inpatient Hospital Stay (HOSPITAL_COMMUNITY)

## 2024-05-17 ENCOUNTER — Encounter: Payer: Self-pay | Admitting: *Deleted

## 2024-05-17 ENCOUNTER — Encounter (HOSPITAL_COMMUNITY): Admission: EM | Disposition: A | Payer: Self-pay | Source: Ambulatory Visit | Attending: Family Medicine

## 2024-05-17 ENCOUNTER — Encounter (HOSPITAL_COMMUNITY): Payer: Self-pay | Admitting: Family Medicine

## 2024-05-17 ENCOUNTER — Ambulatory Visit: Payer: Self-pay | Admitting: Hematology and Oncology

## 2024-05-17 DIAGNOSIS — R7989 Other specified abnormal findings of blood chemistry: Secondary | ICD-10-CM

## 2024-05-17 DIAGNOSIS — R1114 Bilious vomiting: Secondary | ICD-10-CM

## 2024-05-17 DIAGNOSIS — K8050 Calculus of bile duct without cholangitis or cholecystitis without obstruction: Secondary | ICD-10-CM | POA: Diagnosis not present

## 2024-05-17 DIAGNOSIS — K2970 Gastritis, unspecified, without bleeding: Secondary | ICD-10-CM

## 2024-05-17 DIAGNOSIS — R1011 Right upper quadrant pain: Secondary | ICD-10-CM | POA: Diagnosis not present

## 2024-05-17 DIAGNOSIS — K838 Other specified diseases of biliary tract: Secondary | ICD-10-CM

## 2024-05-17 DIAGNOSIS — Z9889 Other specified postprocedural states: Secondary | ICD-10-CM

## 2024-05-17 DIAGNOSIS — R7401 Elevation of levels of liver transaminase levels: Secondary | ICD-10-CM | POA: Diagnosis not present

## 2024-05-17 HISTORY — PX: ERCP: SHX5425

## 2024-05-17 HISTORY — PX: STONE EXTRACTION WITH BASKET: SHX5318

## 2024-05-17 HISTORY — PX: BILIARY STENT PLACEMENT: SHX5538

## 2024-05-17 HISTORY — PX: BIOPSY OF SKIN SUBCUTANEOUS TISSUE AND/OR MUCOUS MEMBRANE: SHX6741

## 2024-05-17 HISTORY — PX: SPHINCTEROTOMY: SHX5279

## 2024-05-17 LAB — DIFFERENTIAL
Abs Immature Granulocytes: 0.04 K/uL (ref 0.00–0.07)
Basophils Absolute: 0 K/uL (ref 0.0–0.1)
Basophils Relative: 1 %
Eosinophils Absolute: 0 K/uL (ref 0.0–0.5)
Eosinophils Relative: 0 %
Immature Granulocytes: 2 %
Lymphocytes Relative: 20 %
Lymphs Abs: 0.5 K/uL — ABNORMAL LOW (ref 0.7–4.0)
Monocytes Absolute: 0.3 K/uL (ref 0.1–1.0)
Monocytes Relative: 10 %
Neutro Abs: 1.7 K/uL (ref 1.7–7.7)
Neutrophils Relative %: 67 %

## 2024-05-17 LAB — CBC
HCT: 34.7 % — ABNORMAL LOW (ref 36.0–46.0)
Hemoglobin: 11.3 g/dL — ABNORMAL LOW (ref 12.0–15.0)
MCH: 32.6 pg (ref 26.0–34.0)
MCHC: 32.6 g/dL (ref 30.0–36.0)
MCV: 100 fL (ref 80.0–100.0)
Platelets: 116 K/uL — ABNORMAL LOW (ref 150–400)
RBC: 3.47 MIL/uL — ABNORMAL LOW (ref 3.87–5.11)
RDW: 14.7 % (ref 11.5–15.5)
WBC: 2.4 K/uL — ABNORMAL LOW (ref 4.0–10.5)
nRBC: 0 % (ref 0.0–0.2)

## 2024-05-17 LAB — COMPREHENSIVE METABOLIC PANEL WITH GFR
ALT: 871 U/L — ABNORMAL HIGH (ref 0–44)
AST: 438 U/L — ABNORMAL HIGH (ref 15–41)
Albumin: 3.8 g/dL (ref 3.5–5.0)
Alkaline Phosphatase: 126 U/L (ref 38–126)
Anion gap: 12 (ref 5–15)
BUN: 9 mg/dL (ref 8–23)
CO2: 23 mmol/L (ref 22–32)
Calcium: 8.7 mg/dL — ABNORMAL LOW (ref 8.9–10.3)
Chloride: 105 mmol/L (ref 98–111)
Creatinine, Ser: 0.54 mg/dL (ref 0.44–1.00)
GFR, Estimated: >60 mL/min (ref >60)
Glucose, Bld: 152 mg/dL — ABNORMAL HIGH (ref 70–99)
Potassium: 3.5 mmol/L (ref 3.5–5.1)
Sodium: 139 mmol/L (ref 135–145)
Total Bilirubin: 5.4 mg/dL — ABNORMAL HIGH (ref 0.0–1.2)
Total Protein: 5.9 g/dL — ABNORMAL LOW (ref 6.5–8.1)

## 2024-05-17 LAB — GLUCOSE, CAPILLARY
Glucose-Capillary: 133 mg/dL — ABNORMAL HIGH (ref 70–99)
Glucose-Capillary: 121 mg/dL — ABNORMAL HIGH (ref 70–99)
Glucose-Capillary: 114 mg/dL — ABNORMAL HIGH (ref 70–99)
Glucose-Capillary: 162 mg/dL — ABNORMAL HIGH (ref 70–99)
Glucose-Capillary: 217 mg/dL — ABNORMAL HIGH (ref 70–99)

## 2024-05-17 LAB — PROTIME-INR
INR: 1.2 (ref 0.8–1.2)
Prothrombin Time: 15.4 s — ABNORMAL HIGH (ref 11.4–15.2)

## 2024-05-17 SURGERY — ERCP, WITH INTERVENTION IF INDICATED
Anesthesia: General

## 2024-05-17 MED ORDER — PANTOPRAZOLE SODIUM 40 MG PO TBEC
40.0000 mg | DELAYED_RELEASE_TABLET | Freq: Every day | ORAL | Status: DC
  Administered 2024-05-18: 40 mg via ORAL
  Filled 2024-05-17 (×2): qty 1

## 2024-05-17 MED ORDER — GLUCAGON HCL RDNA (DIAGNOSTIC) 1 MG IJ SOLR
INTRAMUSCULAR | Status: DC | PRN
  Administered 2024-05-17: .25 mg via INTRAVENOUS

## 2024-05-17 MED ORDER — PROPOFOL 1000 MG/100ML IV EMUL
INTRAVENOUS | Status: AC
  Filled 2024-05-17: qty 100

## 2024-05-17 MED ORDER — CIPROFLOXACIN IN D5W 400 MG/200ML IV SOLN
INTRAVENOUS | Status: AC
  Filled 2024-05-17: qty 200

## 2024-05-17 MED ORDER — ROCURONIUM BROMIDE 100 MG/10ML IV SOLN
INTRAVENOUS | Status: DC | PRN
  Administered 2024-05-17: 40 mg via INTRAVENOUS

## 2024-05-17 MED ORDER — ESMOLOL HCL 100 MG/10ML IV SOLN
INTRAVENOUS | Status: DC | PRN
  Administered 2024-05-17 (×2): 10 mg via INTRAVENOUS

## 2024-05-17 MED ORDER — GABAPENTIN 100 MG PO CAPS
200.0000 mg | ORAL_CAPSULE | Freq: Every day | ORAL | Status: DC
  Administered 2024-05-17: 200 mg via ORAL
  Filled 2024-05-17: qty 2

## 2024-05-17 MED ORDER — DEXAMETHASONE SOD PHOSPHATE PF 10 MG/ML IJ SOLN
INTRAMUSCULAR | Status: DC | PRN
  Administered 2024-05-17: 5 mg via INTRAVENOUS

## 2024-05-17 MED ORDER — LIDOCAINE 2% (20 MG/ML) 5 ML SYRINGE
INTRAMUSCULAR | Status: DC | PRN
  Administered 2024-05-17: 80 mg via INTRAVENOUS

## 2024-05-17 MED ORDER — LABETALOL HCL 5 MG/ML IV SOLN
INTRAVENOUS | Status: DC | PRN
  Administered 2024-05-17 (×2): 2.5 mg via INTRAVENOUS

## 2024-05-17 MED ORDER — PROPOFOL 10 MG/ML IV BOLUS
INTRAVENOUS | Status: DC | PRN
  Administered 2024-05-17: 120 ug/kg/min via INTRAVENOUS
  Administered 2024-05-17: 150 mg via INTRAVENOUS

## 2024-05-17 MED ORDER — GLUCAGON HCL RDNA (DIAGNOSTIC) 1 MG IJ SOLR
INTRAMUSCULAR | Status: AC
  Filled 2024-05-17: qty 1

## 2024-05-17 MED ORDER — SUCCINYLCHOLINE CHLORIDE 200 MG/10ML IV SOSY
PREFILLED_SYRINGE | INTRAVENOUS | Status: DC | PRN
  Administered 2024-05-17: 120 mg via INTRAVENOUS

## 2024-05-17 MED ORDER — SUGAMMADEX SODIUM 200 MG/2ML IV SOLN
INTRAVENOUS | Status: DC | PRN
  Administered 2024-05-17: 400 mg via INTRAVENOUS

## 2024-05-17 MED ORDER — ONDANSETRON HCL 4 MG/2ML IJ SOLN
INTRAMUSCULAR | Status: DC | PRN
  Administered 2024-05-17: 4 mg via INTRAVENOUS

## 2024-05-17 MED ORDER — DICLOFENAC SUPPOSITORY 100 MG
RECTAL | Status: DC | PRN
  Administered 2024-05-17: 100 mg via RECTAL

## 2024-05-17 MED ORDER — LABETALOL HCL 5 MG/ML IV SOLN
5.0000 mg | INTRAVENOUS | Status: DC | PRN
  Administered 2024-05-17: 5 mg via INTRAVENOUS

## 2024-05-17 MED ORDER — FENTANYL CITRATE (PF) 100 MCG/2ML IJ SOLN
INTRAMUSCULAR | Status: DC | PRN
  Administered 2024-05-17 (×2): 50 ug via INTRAVENOUS

## 2024-05-17 MED ORDER — PIPERACILLIN-TAZOBACTAM 3.375 G IVPB
3.3750 g | Freq: Three times a day (TID) | INTRAVENOUS | Status: DC
  Administered 2024-05-17 – 2024-05-18 (×4): 3.375 g via INTRAVENOUS
  Filled 2024-05-17 (×4): qty 50

## 2024-05-17 MED ORDER — LISINOPRIL 5 MG PO TABS
5.0000 mg | ORAL_TABLET | Freq: Every day | ORAL | Status: DC
  Administered 2024-05-18: 5 mg via ORAL
  Filled 2024-05-17 (×2): qty 1

## 2024-05-17 MED ORDER — LABETALOL HCL 5 MG/ML IV SOLN
INTRAVENOUS | Status: AC
  Filled 2024-05-17: qty 4

## 2024-05-17 MED ORDER — SODIUM CHLORIDE 0.9 % IV SOLN: INTRAVENOUS | Status: DC

## 2024-05-17 MED ORDER — DICLOFENAC SUPPOSITORY 100 MG
RECTAL | Status: AC
  Filled 2024-05-17: qty 1

## 2024-05-17 MED ORDER — FENTANYL CITRATE (PF) 100 MCG/2ML IJ SOLN
INTRAMUSCULAR | Status: AC
  Filled 2024-05-17: qty 2

## 2024-05-17 MED ORDER — SODIUM CHLORIDE 0.9 % IV SOLN
INTRAVENOUS | Status: DC | PRN
  Administered 2024-05-17: 60 mL

## 2024-05-17 MED ORDER — DEXMEDETOMIDINE HCL IN NACL 80 MCG/20ML IV SOLN
INTRAVENOUS | Status: DC | PRN
  Administered 2024-05-17 (×2): 8 ug via INTRAVENOUS

## 2024-05-17 NOTE — Anesthesia Preprocedure Evaluation (Signed)
 Anesthesia Evaluation  Patient identified by MRN, date of birth, ID band Patient awake    Reviewed: Allergy & Precautions, NPO status , Patient's Chart, lab work & pertinent test results  History of Anesthesia Complications (+) PONV and history of anesthetic complications  Airway Mallampati: III  TM Distance: >3 FB Neck ROM: Full    Dental no notable dental hx. (+) Teeth Intact   Pulmonary asthma , sleep apnea , neg COPD, Patient abstained from smoking.Not current smoker   Pulmonary exam normal breath sounds clear to auscultation       Cardiovascular Exercise Tolerance: Good METShypertension, Pt. on medications + Peripheral Vascular Disease  (-) CAD and (-) Past MI (-) dysrhythmias  Rhythm:Regular Rate:Normal - Systolic murmurs Hypertensive in preop, 200s/80s. Denies chest pain, blurry vision, headaches, SOB. Has not taken her lisinopril  here.   Neuro/Psych  PSYCHIATRIC DISORDERS Anxiety Depression     Neuromuscular disease    GI/Hepatic ,GERD  ,,(+)     (-) substance abuse  Vomiting for past few days   Endo/Other  diabetes    Renal/GU negative Renal ROS     Musculoskeletal   Abdominal  (+) + obese  Peds  Hematology   Anesthesia Other Findings Past Medical History: No date: Breast cancer (HCC) 02/2016: Cancer (HCC)     Comment:  right breast 01/04/2007: DIABETES MELLITUS, TYPE II     Comment:  only takes actoplus daily 02/29/2008: Dizziness and giddiness No date: DVT, HX OF     Comment:  at age 35 in right buttocks No date: Dyspnea     Comment:  due to lung cancer No date: Family history of breast cancer 01/04/2007: GERD     Comment:  pt reports resolved  07/30/2008: GLAUCOMA     Comment:  both eyes No date: History of blood transfusion     Comment:  no abnormal  reaction 2000: History of uterine cancer     Comment:  hysterectomy done 01/04/2007: HYPERLIPIDEMIA     Comment:  taking Pravastatin   daily 01/04/2007: HYPERTENSION     Comment:  takes Lisinopril  daily No date: Joint pain No date: Joint swelling No date: Leg cramps 07/06/2007: LEG PAIN, LEFT 07/30/2008: NUMBNESS     Comment:  in fingers;pt states from Diamox  09/25/2009: OSTEOARTHRITIS, HIP 02/29/2008: OTITIS MEDIA, ACUTE, BILATERAL 01/04/2007: Overweight(278.02) No date: Peripheral vascular disease 2018: Personal history of radiation therapy No date: Pneumonia No date: PONV (postoperative nausea and vomiting) No date: SLEEP APNEA, OBSTRUCTIVE     Comment:  doesn't use a cpap;study done about 60yrs ago 01/04/2007: TRANSIENT ISCHEMIC ATTACK, HX OF No date: Vision loss     Comment:  left eye  Reproductive/Obstetrics                              Anesthesia Physical Anesthesia Plan  ASA: 3  Anesthesia Plan: General   Post-op Pain Management:    Induction: Intravenous and Rapid sequence  PONV Risk Score and Plan: 4 or greater and Ondansetron , Dexamethasone , TIVA and Propofol  infusion  Airway Management Planned: Oral ETT  Additional Equipment: None  Intra-op Plan:   Post-operative Plan: Extubation in OR  Informed Consent: I have reviewed the patients History and Physical, chart, labs and discussed the procedure including the risks, benefits and alternatives for the proposed anesthesia with the patient or authorized representative who has indicated his/her understanding and acceptance.     Dental advisory given  Plan Discussed with: CRNA and  Surgeon  Anesthesia Plan Comments: (Discussed risks of anesthesia with patient, including PONV, sore throat, lip/dental/eye damage, aspiration. Rare risks discussed as well, such as cardiorespiratory and neurological sequelae, and allergic reactions. Discussed the role of CRNA in patient's perioperative care. Patient understands.)        Anesthesia Quick Evaluation

## 2024-05-17 NOTE — Progress Notes (Signed)
 PT Cancellation Note  Patient Details Name: [name] MRN: [id number] DOB: 1946-03-18   Cancelled Treatment:    Reason Eval/Treat Not Completed: Patient at procedure or test/unavailable. PT to continue to follow acutely.   [place], PT Acute Rehab   [place] VEAR Drone 05/17/2024, 1:47 PM

## 2024-05-17 NOTE — Transfer of Care (Signed)
 Immediate Anesthesia Transfer of Care Note  Patient: [name]  Procedure(s) Performed: ERCP, WITH INTERVENTION IF INDICATED SPHINCTEROTOMY, BILIARY ERCP, WITH LITHROTRIPSY OR REMOVAL OF COMMON BILE DUCT CALCULUS USING BALLOON BIOPSY, SKIN, SUBCUTANEOUS TISSUE, OR MUCOUS MEMBRANE INSERTION, STENT, BILE DUCT  Patient Location: PACU  Anesthesia Type:General  Level of Consciousness: awake  Airway & Oxygen  Therapy: Patient Spontanous Breathing and Patient connected to nasal cannula oxygen   Post-op Assessment: Report given to RN and Post -op Vital signs reviewed and stable  Post vital signs: Reviewed and stable  Last Vitals:  Vitals Value Taken Time  BP 161/113 05/17/24 14:50  Temp    Pulse 85 05/17/24 14:50  Resp 9 05/17/24 14:50  SpO2 93 % 05/17/24 14:50  Vitals shown include unfiled device data.  Last Pain:  Vitals:   05/17/24 1324  TempSrc: Temporal  PainSc: 0-No pain         Complications: No notable events documented.

## 2024-05-17 NOTE — Op Note (Signed)
 [facility] Patient Name: [name] Procedure Date: 05/17/2024 MRN: [id number] Attending MD: Aloha Finner , MD, [id number] Date of Birth: 1945/10/31 CSN: [id number] Age: 78 Admit Type: Inpatient Procedure:                ERCP Indications:              Bile duct stone(s), Bile duct stone on Computed                            Tomogram Scan, Elevated liver enzymes Providers:                Aloha Finner, MD, Ozell Pouch, Haskel Chris, Technician Referring MD:              Medicines:                General Anesthesia, Diclofenac  100 mg rectal,                            Glucagon  0.25 mg IV Complications:            No immediate complications. Estimated Blood Loss:     Estimated blood loss was minimal. Procedure:                Pre-Anesthesia Assessment:                           - Prior to the procedure, a History and Physical                            was performed, and patient medications and                            allergies were reviewed. The patient's tolerance of                            previous anesthesia was also reviewed. The risks                            and benefits of the procedure and the sedation                            options and risks were discussed with the patient.                            All questions were answered, and informed consent                            was obtained. Prior Anticoagulants: The patient has                            taken no anticoagulant or antiplatelet agents  except for aspirin . ASA Grade Assessment: III - A                            patient with severe systemic disease. After                            reviewing the risks and benefits, the patient was                            deemed in satisfactory condition to undergo the                            procedure.                           After obtaining informed consent, the scope was                             passed under direct vision. Throughout the                            procedure, the patient's blood pressure, pulse, and                            oxygen  saturations were monitored continuously. The                            [employer] D single use                            duodenoscope was introduced through the mouth, and                            used to inject contrast into and used to inject                            contrast into the bile duct. The ERCP was                            accomplished without difficulty. The patient                            tolerated the procedure. Scope In: Scope Out: Findings:      A scout film of the abdomen was obtained. Surgical clips, consistent       with a previous cholecystectomy, were seen in the area of the right       upper quadrant of the abdomen.      The upper GI tract was traversed under direct vision without detailed       examination. The Z-line was irregular. A J-shaped deformity was found in       the stomach. Patchy mildly erythematous mucosa without bleeding was       found in the entire examined stomach - biopsied for HP evaluation. No       gross lesions were noted  in the duodenal bulb, in the first portion of       the duodenum and in the second portion of the duodenum. The major       papilla was small.      A 0.035 inch x 260 cm straight Hydra Jagwire was passed into the biliary       tree. The Hydratome sphincterotome was passed over the guidewire and the       bile duct was then deeply cannulated. Contrast was injected. I       personally interpreted the bile duct images. Ductal flow of contrast was       adequate. Image quality was adequate. Contrast extended to the hepatic       ducts. Opacification of the entire biliary tree except for the cystic       duct and gallbladder was successful. The lower third of the main duct       contained filling defects thought to be  sludge. The main bile duct was       markedly dilated. The largest diameter was at least 22 mm. A 6 mm       biliary sphincterotomy was made with a monofilament Hydratome       sphincterotome using ERBE electrocautery. There was no       post-sphincterotomy bleeding. To discover objects, the biliary tree was       swept with a retrieval balloon. Sludge was swept from the duct which had       appearance of hemobilia/old blood products more than stone debris. An       occlusion cholangiogram was performed that showed no further significant       biliary pathology but ability to fill the duct completely due to the       dilated nature made this not possible. We monitored after passage of the       sphincterotome and balloon and flow of bile/contrast was quite slow.       Decision made to place a stent to allow improved drainage. One 10 Fr by       5 cm plastic biliary stent with a single external flap and a single       internal flap was placed into the common bile duct. The stent was in       good position.      A pancreatogram was not performed.      The duodenoscope was withdrawn from the patient. Impression:               - Z-line irregular.                           - Erythematous mucosa in the stomach. Biopsied.                           - J-shaped gastric deformity.                           - No gross lesions in the duodenal bulb, in the                            first portion of the duodenum and in the second  portion of the duodenum.                           - Small major papilla.                           - The fluoroscopic examination was suspicious for                            sludge.                           - The entire main bile duct was markedly dilated.                           - A biliary sphincterotomy was performed.                           - The biliary tree was swept and sludge was found                            (appearance of  hemobilia and old blood products                            more than stone debris).                           - Flow of contrast/bile was slow.                           - One plastic biliary stent was placed into the                            common bile duct to ensure adequate drainage. Moderate Sedation:      Not Applicable - Patient had care per Anesthesia. Recommendation:           - The patient will be observed post-procedure,                            until all discharge criteria are met.                           - Return patient to hospital ward for ongoing care.                           - Advance diet as tolerated.                           - Observe patient's clinical course.                           - Await path results.                           - Repeat ERCP in 58-months for stent removal and  final biliary clean out.                           - Antibiotics as per primary service.                           - The findings and recommendations were discussed                            with the patient.                           - The findings and recommendations were discussed                            with the referring physician. Procedure Code(s):        --- Professional ---                           [id number], Endoscopic retrograde                            cholangiopancreatography (ERCP); with placement of                            endoscopic stent into biliary or pancreatic duct,                            including pre- and post-dilation and guide wire                            passage, when performed, including sphincterotomy,                            when performed, each stent                           43264, Endoscopic retrograde                            cholangiopancreatography (ERCP); with removal of                            calculi/debris from biliary/pancreatic duct(s)                           43261, 59, Endoscopic retrograde                             cholangiopancreatography (ERCP); with biopsy,                            single or multiple                           74328, Endoscopic catheterization of the biliary  ductal system, radiological supervision and                            interpretation Diagnosis Code(s):        --- Professional ---                           K22.89, Other specified disease of esophagus                           K31.89, Other diseases of stomach and duodenum                           K80.50, Calculus of bile duct without cholangitis                            or cholecystitis without obstruction                           R74.8, Abnormal levels of other serum enzymes                           K83.8, Other specified diseases of biliary tract CPT copyright 2022 American Medical Association. All rights reserved. The codes documented in this report are preliminary and upon coder review may  be revised to meet current compliance requirements. Aloha Finner, MD 05/17/2024 2:43:02 PM Number of Addenda: 0

## 2024-05-17 NOTE — TOC Initial Note (Addendum)
 Transition of Care [facility]) - Initial/Assessment Note    Patient Details  Name: [name] MRN: [id number] Date of Birth: 1945-11-24  [facility]) CM/SW Contact:    Alfonse JONELLE Rex, RN Phone Number: 05/17/2024, 1:32 PM  Clinical Narrative:  [facility] for a possible Biliary Obstruction, patient reports  generalized pain, N&V. GI consulted, plan for ERCP today. Patient has PCP and insurance on file, resides in a private residence, spouse contact information on file. PT eval pending, await recommendation.  INPT CM will follow for dc needs.   Expected Discharge Plan: Home w Home Health Services Barriers to Discharge: Continued Medical Work up   Patient Goals and CMS Choice Patient states their goals for this hospitalization and ongoing recovery are:: return home          Expected Discharge Plan and Services       Living arrangements for the past 2 months: Single Family Home                                      Prior Living Arrangements/Services Living arrangements for the past 2 months: Single Family Home Lives with:: Spouse Patient language and need for interpreter reviewed:: Yes        Need for Family Participation in Patient Care: Yes (Comment) Care giver support system in place?: Yes (comment)   Criminal Activity/Legal Involvement Pertinent to Current Situation/Hospitalization: No - Comment as needed  Activities of Daily Living   ADL Screening (condition at time of admission) Independently performs ADLs?: Yes (appropriate for developmental age) Is the patient deaf or have difficulty hearing?: No Does the patient have difficulty seeing, even when wearing glasses/contacts?: Yes Does the patient have difficulty concentrating, remembering, or making decisions?: No  Permission Sought/Granted                  Emotional Assessment         Alcohol / Substance Use: Not Applicable Psych Involvement: No  (comment)  Admission diagnosis:  Choledocholithiasis [K80.50] Patient Active Problem List   Diagnosis Date Noted   Abnormal LFTs 05/17/2024   History of liver biopsy 05/17/2024   Abdominal pain, right upper quadrant 05/17/2024   Bilious vomiting with nausea 05/17/2024   Dilated bile duct 05/17/2024   Choledocholithiasis 05/16/2024   Transaminitis 05/16/2024   Dysuria 04/09/2024   Mixed stress and urge urinary incontinence 11/25/2022   Acute right-sided low back pain with right-sided sciatica 11/25/2022   Peripheral edema 01/19/2019   Renal mass, right 07/03/2017   Aortic atherosclerosis 06/29/2017   Goals of care, counseling/discussion 05/29/2017   Chemotherapy-induced neuropathy 11/12/2016   Hepatic steatosis 04/18/2016   Mediastinal lymphadenopathy    Pulmonary nodules 03/22/2016   Malignant neoplasm metastatic to lung (HCC) 03/15/2016   Genetic testing 02/16/2016   Morbid obesity (HCC) 02/03/2016   History of uterine cancer    Malignant neoplasm of upper-outer quadrant of right breast in female, estrogen receptor positive (HCC) 01/26/2016   Branch retinal vein occlusion of left eye (HCC) 05/02/2015   Osteoarthritis of left hip 11/12/2012   Degenerative arthritis of hip 05/28/2012   Preventative health care 10/22/2010   OSTEOARTHRITIS, HIP 09/25/2009   GLAUCOMA 07/30/2008   DVT, HX OF 07/06/2007   Diabetes (HCC) 01/04/2007   Hyperlipidemia associated with type 2 diabetes mellitus (HCC) 01/04/2007   Depression with anxiety 01/04/2007   Obstructive  sleep apnea 01/04/2007   Essential hypertension 01/04/2007   Asthma 01/04/2007   GERD 01/04/2007   PCP:  Rollene Almarie LABOR, MD Pharmacy:   [facility] [address] [place]), [place] - [address] [place] [address] Phone: [phone] Fax: [phone]  [facility] LONG - [facility] [address] [place] [address] Phone: [phone] Fax:  [phone]  [facility] [address] [place] - [address] [place] [address] [address] Phone: [phone] Fax: [phone]  [facility] - [place], [place] - [address] [place] [address] Phone: [phone] Fax: [phone]     Social Drivers of Health (SDOH) Social History: SDOH Screenings   Food Insecurity: No Food Insecurity (05/16/2024)  Housing: Low Risk  (05/16/2024)  Transportation Needs: No Transportation Needs (05/16/2024)  Utilities: Not At Risk (05/16/2024)  Alcohol Screen: Low Risk  (02/25/2022)  Depression (PHQ2-9): Low Risk  (05/02/2024)  Financial Resource Strain: Low Risk  (02/06/2023)  Physical Activity: Inactive (05/02/2024)  Social Connections: Socially Integrated (05/16/2024)  Stress: No Stress Concern Present (05/02/2024)  Tobacco Use: Low Risk  (05/17/2024)  Health Literacy: Adequate Health Literacy (05/02/2024)   SDOH Interventions:     Readmission Risk Interventions    05/17/2024    1:31 PM  Readmission Risk Prevention Plan  Transportation Screening Complete  PCP or Specialist Appt within 5-7 Days Complete  Home Care Screening Complete  Medication Review (RN CM) Complete

## 2024-05-17 NOTE — Anesthesia Procedure Notes (Signed)
 Procedure Name: Intubation Date/Time: 05/17/2024 1:49 PM  Performed by: Belvie Valri NOVAK, CRNAPre-anesthesia Checklist: Patient identified, Emergency Drugs available, Suction available and Patient being monitored Patient Re-evaluated:Patient Re-evaluated prior to induction Oxygen  Delivery Method: Circle System Utilized Preoxygenation: Pre-oxygenation with 100% oxygen  Induction Type: IV induction and Rapid sequence Laryngoscope Size: Mac and 3 Grade View: Grade III Tube type: Oral Tube size: 7.0 mm Number of attempts: 1 Airway Equipment and Method: Stylet and Oral airway Placement Confirmation: ETT inserted through vocal cords under direct vision, positive ETCO2 and breath sounds checked- equal and bilateral Secured at: 23 cm Tube secured with: Tape Dental Injury: Teeth and Oropharynx as per pre-operative assessment

## 2024-05-17 NOTE — Plan of Care (Signed)
  Problem: Health Behavior/Discharge Planning: Goal: Ability to manage health-related needs will improve Outcome: Progressing   Problem: Pain Managment: Goal: General experience of comfort will improve and/or be controlled Outcome: Progressing

## 2024-05-17 NOTE — Consult Note (Addendum)
 Gastroenterology Inpatient Consultation   Attending Requesting Consult Odell Celinda Balo, MD  [facility] Day: 3  Reason for Consult Abnormal LFTs, choledocholithiasis, abdominal pain    History of Present Illness  [name] is a 78 y.o. female with a pmh significant for Diabetes, hypertension, hyperlipidemia, arthritis, metastatic breast cancer (to liver), prior lung cancer, GERD, esophageal stenosis.  The GI service is consulted for evaluation and management of abdominal pain, abnormal LFTs, imaging concerning for choledocholithiasis.  This patient is followed by Dr. Abran and been seen in August with issues related to dysphagia and food impactions.  She eventually underwent an upper endoscopy showing evidence of an esophageal stenosis post dilation in August.  She followed up with her oncologist who obtained imaging in September.  There was a progressive liver lesion.  Decision made to better define receptor positivity of the liver lesion by performing a liver biopsy.  This was performed on 12/2 by interventional radiology.  This was a successful biopsy per IR notation.  She did have pain postprocedure but that improved.  She was able to be discharged.  She had progressive nausea and vomiting.  She underwent with interventional radiology recommendation a repeat CT scan with contrast which showed no evidence of active extravasation but did show evidence of ductal dilation and choledocholithiasis.  She was recommended to follow-up with GI but she came into the emergency department due to progressive symptoms.  There is notation in the chart that GI was consulted, GI inpatient as the long team was not notified until 4 PM 12/4 about her admission to the hospital.  She has continued to have nausea and vomiting and abdominal pain today when I have seen her.  Updated liver biochemical testing labs on 12/4 still pending completion at this point.  Her husband is with her today at the  bedside.  She is hemodynamically stable currently.  She is wanting to eat.  GI Review of Systems Positive as above occluding very mild dysphagia still Negative for melena, hematochezia  Review of Systems  General: Denies fevers/chills Cardiovascular: Denies chest pain Pulmonary: Denies shortness of breath Gastroenterological: See HPI Genitourinary: Denies darkened urine Hematological: Denies easy bruising/bleeding Dermatological: She reports she notices jaundice currently but had not noticed that previously Psychological: Mood is stable   Histories  Past Medical Past Medical History:  Diagnosis Date   Breast cancer (HCC)    Cancer (HCC) 02/2016   right breast   DIABETES MELLITUS, TYPE II 01/04/2007   only takes actoplus daily   Dizziness and giddiness 02/29/2008   DVT, HX OF    at age 5 in right buttocks   Dyspnea    due to lung cancer   Family history of breast cancer    GERD 01/04/2007   pt reports resolved    GLAUCOMA 07/30/2008   both eyes   History of blood transfusion    no abnormal  reaction   History of uterine cancer 2000   hysterectomy done   HYPERLIPIDEMIA 01/04/2007   taking Pravastatin  daily   HYPERTENSION 01/04/2007   takes Lisinopril  daily   Joint pain    Joint swelling    Leg cramps    LEG PAIN, LEFT 07/06/2007   NUMBNESS 07/30/2008   in fingers;pt states from Diamox    OSTEOARTHRITIS, HIP 09/25/2009   OTITIS MEDIA, ACUTE, BILATERAL 02/29/2008   Overweight(278.02) 01/04/2007   Peripheral vascular disease    Personal history of radiation therapy 2018   Pneumonia    PONV (  postoperative nausea and vomiting)    SLEEP APNEA, OBSTRUCTIVE    doesn't use a cpap;study done about 62yrs ago   TRANSIENT ISCHEMIC ATTACK, HX OF 01/04/2007   Vision loss    left eye   Past Surgical Past Surgical History:  Procedure Laterality Date   ABDOMINAL HYSTERECTOMY  2000   BREAST LUMPECTOMY Right 01/13/2017   x2   BREAST LUMPECTOMY WITH RADIOACTIVE SEED AND SENTINEL  LYMPH NODE BIOPSY Right 01/13/2017   Procedure: RIGHT BREAST RADIOACTIVE SEED X'S 2 GUIDED LUMPECTOMY WITH RADIOACTIVE SEED TARGETED AXILLARYLYMPH NODE EXCISION AND RIGHT AXILLARY SENTINEL LYMPH NODE BIOPSY;  Surgeon: Ethyl Lenis, MD;  Location: [facility] OR;  Service: General;  Laterality: Right;  2 SEEDS IN RIGHT BREAST 1 SEED IN RIGHT AXILLARY NODE   CHOLECYSTECTOMY     ENDOBRONCHIAL ULTRASOUND Bilateral 03/28/2016   Procedure: ENDOBRONCHIAL ULTRASOUND;  Surgeon: Lamar GORMAN Chris, MD;  Location: [facility];  Service: Cardiopulmonary;  Laterality: Bilateral;   EYE SURGERY  13   shunt left and lazer eye surgery on right cataract and retenia tear with repair   growth removal  2004   from thumb   KNEE ARTHROSCOPY Right    mulitple eye surgeries     both eyes, cataracts with ioc done both eyes   OOPHORECTOMY     right lumpectomy with axillary node dissection Right 01/2017   TOTAL HIP ARTHROPLASTY  06/24/2011   Procedure: TOTAL HIP ARTHROPLASTY;  Surgeon: Dempsey JINNY Sensor;  Location: [facility] OR;  Service: Orthopedics;  Laterality: Right;   TOTAL HIP ARTHROPLASTY Left 11/12/2012   Dr Sensor   TOTAL HIP ARTHROPLASTY Left 11/12/2012   Procedure: TOTAL HIP ARTHROPLASTY;  Surgeon: Dempsey JINNY Sensor, MD;  Location: [facility] OR;  Service: Orthopedics;  Laterality: Left;  DEPUY PINNACLE   Allergies Allergies  Allergen Reactions   Codeine  Hives    Hycodan syrup   Fluorescein  Nausea And Vomiting    ? IV dye for retina specialist   Oxycodone  Nausea And Vomiting    Patient vomited for 3 days after taking   Lipitor [Atorvastatin  Calcium ] Other (See Comments)    Leg cramp   Sitagliptin Phosphate Nausea And Vomiting   Sulfa Drugs Cross Reactors Nausea And Vomiting   Family Family History  Problem Relation Age of Onset   Breast cancer Mother 61   Dementia Mother    Cancer Mother        Breast and lung cancer   Stroke Sister    Breast cancer Sister 61   Heart attack Maternal Aunt    Lung cancer Maternal Grandmother         non smoker   Glaucoma Maternal Grandfather    Anesthesia problems Neg Hx    Colon cancer Neg Hx    Esophageal cancer Neg Hx    Rectal cancer Neg Hx    Stomach cancer Neg Hx    Social Social History   Socioeconomic History   Marital status: Married    Spouse name: Debby   Number of children: Not on file   Years of education: Not on file   Highest education level: Not on file  Occupational History   Occupation: physiological scientist    Employer: [employer]   Occupation: retired  Tobacco Use   Smoking status: Never   Smokeless tobacco: Never  Vaping Use   Vaping status: Never Used  Substance and Sexual Activity   Alcohol use: No   Drug use: No   Sexual activity: Not Currently  Other  Topics Concern   Not on file  Social History Narrative   Lives with husband/2025   Social Drivers of Health   Financial Resource Strain: Low Risk  (02/06/2023)   Overall Financial Resource Strain (CARDIA)    Difficulty of Paying Living Expenses: Not hard at all  Food Insecurity: No Food Insecurity (05/16/2024)   Hunger Vital Sign    Worried About Running Out of Food in the Last Year: Never true    Ran Out of Food in the Last Year: Never true  Transportation Needs: No Transportation Needs (05/16/2024)   PRAPARE - Administrator, Civil Service (Medical): No    Lack of Transportation (Non-Medical): No  Physical Activity: Inactive (05/02/2024)   Exercise Vital Sign    Days of Exercise per Week: 0 days    Minutes of Exercise per Session: 0 min  Stress: No Stress Concern Present (05/02/2024)   [employer] of Occupational Health - Occupational Stress Questionnaire    Feeling of Stress: Only a little  Social Connections: Socially Integrated (05/16/2024)   Social Connection and Isolation Panel    Frequency of Communication with Friends and Family: More than three times a week    Frequency of Social Gatherings with Friends and Family: Three times a week    Attends  Religious Services: More than 4 times per year    Active Member of Clubs or Organizations: Yes    Attends Banker Meetings: Never    Marital Status: Married  Catering Manager Violence: Not At Risk (05/16/2024)   Humiliation, Afraid, Rape, and Kick questionnaire    Fear of Current or Ex-Partner: No    Emotionally Abused: No    Physically Abused: No    Sexually Abused: No    Medications  Home Medications No current facility-administered medications on file prior to encounter.   Current Outpatient Medications on File Prior to Encounter  Medication Sig Dispense Refill   acetaminophen  (TYLENOL ) 500 MG tablet Take 1,000 mg by mouth every 4 (four) hours as needed for moderate pain or fever. (Patient taking differently: Take 1,000 mg by mouth every 6 (six) hours as needed for moderate pain (pain score 4-6) or fever.)     aspirin  EC 81 MG tablet Take 81 mg by mouth daily at 6 PM. 1700     Biotin 1 MG CAPS Take 1 capsule by mouth daily.     brimonidine  (ALPHAGAN ) 0.2 % ophthalmic solution INSTILL 1 DROP INTO EACH EYE THREE TIMES DAILY     cholecalciferol (VITAMIN D) 1000 units tablet Take 1,000 Units by mouth daily.     dorzolamide -timolol  (COSOPT ) 22.3-6.8 MG/ML ophthalmic solution Place 1 drop into both eyes 2 (two) times daily.     ketorolac  (ACULAR ) 0.5 % ophthalmic solution Place 1 drop into the right eye every morning.     lisinopril  (ZESTRIL ) 5 MG tablet Take 1 tablet by mouth once daily 90 tablet 0   lovastatin  (MEVACOR ) 20 MG tablet TAKE 1 TABLET BY MOUTH AT BEDTIME 90 tablet 3   Netarsudil -Latanoprost  (ROCKLATAN ) 0.02-0.005 % SOLN Place 1 drop into both eyes at bedtime.     palbociclib  (IBRANCE ) 75 MG tablet Take 1 tablet (75 mg total) by mouth daily. Take for 21 days on, 7 days off, repeat every 28 days. 21 tablet 11   pioglitazone -metformin  (ACTOPLUS MET ) 15-850 MG tablet Take 1 tablet by mouth daily. 90 tablet 3   prednisoLONE  acetate (PRED FORTE ) 1 % ophthalmic  suspension Place 1 drop into both  eyes as directed. INSTILL ONE DROP INTO BOTH EYES AS DIRECTED TWICE A DAY INTO RIGHT EYE FOUR TIMES A DAY IN LEFT EYE     Baclofen  5 MG TABS Take 0.5-1 tablets (2.5-5 mg total) by mouth 2 (two) times daily as needed. (Patient not taking: Reported on 05/16/2024) 60 tablet 0   gabapentin  (NEURONTIN ) 100 MG capsule Take 200 mg at bedtime, can increase to 300 mg at bedtime if tolerated 90 capsule 3   meloxicam  (MOBIC ) 15 MG tablet Take 1 tablet (15 mg total) by mouth daily. Take with food (Patient not taking: Reported on 05/16/2024) 30 tablet 0   ondansetron  (ZOFRAN -ODT) 4 MG disintegrating tablet Take 1 tablet (4 mg total) by mouth every 8 (eight) hours as needed for nausea or vomiting. 20 tablet 3   pantoprazole  (PROTONIX ) 40 MG tablet Take 1 tablet (40 mg total) by mouth daily. (Patient not taking: Reported on 05/16/2024) 30 tablet 11   Scheduled Inpatient Medications  brimonidine   1 drop Both Eyes TID   dorzolamide -timolol   1 drop Both Eyes BID   enoxaparin  (LOVENOX ) injection  40 mg Subcutaneous Q24H   insulin  aspart  0-9 Units Subcutaneous TID WC   ketorolac   1 drop Right Eye q morning   Netarsudil -Latanoprost   1 drop Ophthalmic QHS   prednisoLONE  acetate  2 drop Both Eyes QID   Continuous Inpatient Infusions  sodium chloride  100 mL/hr at 05/17/24 0508   PRN Inpatient Medications hydrALAZINE , HYDROmorphone  (DILAUDID ) injection, labetalol , ondansetron  (ZOFRAN ) IV   Physical Examination  BP (!) 157/74 (BP Location: Left Arm)   Pulse (!) 107   Temp 99.2 F (37.3 C) (Oral)   Resp 16   Ht 5' 4 (1.626 m)   Wt 104 kg   SpO2 94%   BMI 39.36 kg/m  GEN: NAD, appears stated age, doesn't appear chronically ill, husband at bedside PSYCH: Cooperative, without pressured speech EYE: Icteric sclerae ENT: MMM CV: Nontachycardic RESP: No audible wheezing GI: NABS, soft, [place] to abdomen, rounded, to palpation in the right upper quadrant region, additional  guarding is present, no rebound  MSK/EXT: No significant lower extremity edema SKIN: Jaundiced NEURO:  Alert & Oriented x 3, no focal deficits   Review of Data  I reviewed the following data at the time of this encounter:  Laboratory Studies   Recent Labs  Lab 05/16/24 2114  NA 140  K 3.5  CL 105  CO2 24  BUN 9  CREATININE 0.61  GLUCOSE 166*  CALCIUM  8.4*   Recent Labs  Lab 05/16/24 2114  AST 555*  ALT 906*  ALKPHOS 120    Recent Labs  Lab 05/14/24 1115 05/15/24 1853 05/17/24 0458  WBC 2.3* 4.2 2.4*  HGB 11.9* 13.8 11.3*  HCT 35.3* 39.4 34.7*  PLT 183 184 116*   Recent Labs  Lab 05/14/24 1115 05/17/24 0458  INR 1.1 1.2   MELD 3.0: 16 at 05/17/2024  4:58 AM MELD-Na: 15 at 05/17/2024  4:58 AM Calculated from: Serum Creatinine: 0.61 mg/dL (Using min of 1 mg/dL) at 87/10/7972  0:85 PM Serum Sodium: 140 mmol/L (Using max of 137 mmol/L) at 05/16/2024  9:14 PM Total Bilirubin: 5.1 mg/dL at 87/10/7972  0:85 PM Serum Albumin : 3.7 g/dL (Using max of 3.5 g/dL) at 87/10/7972  0:85 PM INR(ratio): 1.2 at 05/17/2024  4:58 AM Age at listing (hypothetical): 78 years Sex: Female at 05/17/2024  4:58 AM   Imaging Studies  September 2025 CT chest/abdomen/pelvis IMPRESSION: 1. Interval enlargement of a hypodense lesion  in the anterior liver dome, consistent with an enlarging metastasis. 2. Right lumpectomy with subpleural radiation fibrosis of the right pulmonary apex and anterior right lung. 3. Coronary artery disease.  05/14/2024 CT liver biopsy IMPRESSION: Technically successful CT guided core needle biopsy of right lobe liver mass.  05/15/2024 CT abdomen with/without contrast IMPRESSION: 1. No evidence of hemorrhage or other complication related to the recent liver biopsy. 2. Similar appearance of severe intra and extrahepatic biliary ductal dilatation with associated choledocholithiasis. Recommend referral to gastroenterology if not previously obtained. 3.  Additional ancillary findings as above without interval change.  05/15/2024 right upper quadrant ultrasound IMPRESSION: 1. Dilated common bile duct measuring up to 2.1 cm. No definitive common bile duct stone seen, although evaluation of the distal common bile duct is limited secondary to overlying bowel gas. Consider further evaluation with MRCP. 2. Hepatic steatosis.  This   GI Procedures and Studies  Upper endoscopy August 2025 1. GERD complicated by esophagitis and peptic stricture status post dilation 2. Hiatal hernia 3. Mild gastroduodenitis. Status post biopsies   Assessment  [name] is a 78 y.o. female.  The GI service is consulted for evaluation and management of  LFTs, imaging concerning for choledocholithiasis with continued abdominal pain/nausea/vomiting.  The patient is hemodynamically stable at this time.  No evidence of overt cholangitis.  She does have evidence of what appears to be biliary sludge and debris duct.  It is interesting that this is only, on postprocedure 1 would query whether some of this may be blood products even if there has not been any evidence of extravasation seen.  In any case, ERCP is a reasonable next step for this individual.  Timing of ERCP is most likely going to be Saturday, but in the chance that we can fit her in on Friday n.p.o. midnight.  She is okay for full liquid diet today.  If we cannot accommodate her on Friday, she will be scheduled for Saturday.  The risks of an ERCP were discussed at length, including but not limited to the risk of perforation, bleeding, abdominal pain, post-ERCP pancreatitis (while usually mild can be severe and even life threatening).  The risks of an EUS including intestinal perforation, bleeding, infection, aspiration, and medication effects were discussed as was the possibility it may not give a definitive diagnosis if a biopsy is performed.  When a biopsy of the pancreas is done as part of the EUS, there is an  additional risk of pancreatitis at the rate of about 1-2%.  It was explained that procedure related pancreatitis is typically mild, although it can be severe and even life threatening, which is why we do not perform random pancreatic biopsies and only biopsy a lesion/area we feel is concerning enough to warrant the risk.  My team will follow-up with her tomorrow morning.  All patient questions were answered to the best of my ability, and the patient agrees to the aforementioned plan of action with follow-up as indicated.   Plan/Recommendations  Full liquid diet okay today N.p.o. at midnight Will work in to Friday morning to know whether ERCP availability can occur for Friday afternoon versus Saturday morning - If unable to accommodate ERCP on Friday and will allow patient to eat and be n.p.o. will communicate this by midmorning the inpatient team Repeat CBC/CMP/INR in the a.m. -She has had near neutropenia in the past and it would be helpful for us  to know whether this progresses requires other workup and evaluation as long as ANC  is greater than 500 we can perform endoscopic therapy safely otherwise there is an increased risk for infection - If further persisting neutropenia is present, consider antibiotics for prophylaxis of cholangitis Need to monitor   Thank you for this consult.  We will continue to follow.  Please page/call with questions or concerns.   Aloha Finner, MD [facility] Office # [phone]

## 2024-05-17 NOTE — Progress Notes (Signed)
 OT Cancellation Note  Patient Details Name: [name] MRN: [id number] DOB: 1945/09/10   Cancelled Treatment:    Reason Eval/Treat Not Completed: (P) Patient at procedure or test/ unavailable, will check back as time allows  [name] 05/17/2024, 2:10 PM

## 2024-05-17 NOTE — Progress Notes (Signed)
 [facility] Progress Note  CC:  Choledocholithiasis, elevated LFTs and abdominal pain   Subjective: She remains NPO.  She continues to have nausea without any further vomiting overnight or thus far today.  She endorsed having upper middle back pain overnight which abated.  No abdominal pain at this time.  No BM since admission.  Urine is dark in color.  She denies having any chest pain or shortness of breath.  She stated she has not eaten any solid food since Monday and she feels more weak today. Her husband is at the bedside.   Objective:  Vital signs in last 24 hours: Temp:  [98 F (36.7 C)-99.2 F (37.3 C)] 99.2 F (37.3 C) (12/05 0530) Pulse Rate:  [90-107] 107 (12/05 0530) Resp:  [15-18] 16 (12/05 0530) BP: (157-198)/(62-94) 157/74 (12/05 0530) SpO2:  [77 %-97 %] 94 % (12/05 0530) Last BM Date : 05/13/24 General: Fatigued appearing 78 year old female in no acute distress. Eyes: Mild scleral icterus present. Heart: Mildly tachycardic.  No murmur. Pulm: Breath sounds clear throughout.  On room air. Abdomen: Soft, nondistended.  Mild epigastric tenderness without rebound or guarding.  Positive bowel sounds to all 4 quadrants. Extremities: No lower extremity edema. Neurologic:  Alert and oriented x 4.  Speech is clear.  Moves all extremities equally. Psych:  Alert and cooperative. Normal mood and affect.  Intake/Output from previous day: 12/04 0701 - 12/05 0700 In: 2176.1 [P.O.:150; I.V.:2026.1] Out: 1300 [Urine:1300] Intake/Output this shift: No intake/output data recorded.  Lab Results: Recent Labs    05/14/24 1115 05/15/24 1853 05/17/24 0458  WBC 2.3* 4.2 2.4*  HGB 11.9* 13.8 11.3*  HCT 35.3* 39.4 34.7*  PLT 183 184 116*   BMET Recent Labs    05/15/24 1853 05/16/24 2114 05/17/24 0458  NA 138 140 139  K 3.8 3.5 3.5  CL 100 105 105  CO2 24 24 23   GLUCOSE 279* 166* 152*  BUN 9 9 9   CREATININE 0.77 0.61 0.54  CALCIUM  9.4 8.4* 8.7*    LFT Recent Labs    05/17/24 0458  PROT 5.9*  ALBUMIN  3.8  AST 438*  ALT 871*  ALKPHOS 126  BILITOT 5.4*   PT/INR Recent Labs    05/14/24 1115 05/17/24 0458  LABPROT 14.6 15.4*  INR 1.1 1.2   Hepatitis Panel Recent Labs    05/16/24 0858  HEPBSAG NON REACTIVE  HCVAB NON REACTIVE  HEPAIGM NON REACTIVE  HEPBIGM NON REACTIVE    VAS US  LOWER EXTREMITY VENOUS (DVT) Result Date: 05/16/2024  Lower Venous DVT Study Patient Name:  [name]  Date of Exam:   05/16/2024 Medical Rec #: [id number]      Accession #:    [id number] Date of Birth: [birth year]       Patient Gender: F Patient Age:   47 years Exam Location:  [facility] Procedure:      VAS US  LOWER EXTREMITY VENOUS (DVT) Referring Phys: ELSPETH NEWTON --------------------------------------------------------------------------------  Indications: Swelling, and Chronic swelling in both leg. Right worse than left.  Comparison Study: No priors. Performing Technologist: Ricka Sturdivant-Jones RDMS, RVT  Examination Guidelines: A complete evaluation includes B-mode imaging, spectral Doppler, color Doppler, and power Doppler as needed of all accessible portions of each vessel. Bilateral testing is considered an integral part of a complete examination. Limited examinations for reoccurring indications may be performed as noted. The reflux portion of the exam is performed with the patient in reverse Trendelenburg.  +---------+---------------+---------+-----------+----------+--------------+ RIGHT  CompressibilityPhasicitySpontaneityPropertiesThrombus Aging +---------+---------------+---------+-----------+----------+--------------+ CFV      Full           Yes      Yes                                 +---------+---------------+---------+-----------+----------+--------------+ SFJ      Full                                                        +---------+---------------+---------+-----------+----------+--------------+ FV  Prox  Full                                                        +---------+---------------+---------+-----------+----------+--------------+ FV Mid   Full           Yes      Yes                                 +---------+---------------+---------+-----------+----------+--------------+ FV DistalFull                                                        +---------+---------------+---------+-----------+----------+--------------+ PFV      Full                                                        +---------+---------------+---------+-----------+----------+--------------+ POP      Full           Yes      Yes                                 +---------+---------------+---------+-----------+----------+--------------+ PTV      Full                                                        +---------+---------------+---------+-----------+----------+--------------+ PERO     Full                                                        +---------+---------------+---------+-----------+----------+--------------+   +---------+---------------+---------+-----------+----------+--------------+ LEFT     CompressibilityPhasicitySpontaneityPropertiesThrombus Aging +---------+---------------+---------+-----------+----------+--------------+ CFV      Full           Yes      Yes                                 +---------+---------------+---------+-----------+----------+--------------+  SFJ      Full                                                        +---------+---------------+---------+-----------+----------+--------------+ FV Prox  Full                                                        +---------+---------------+---------+-----------+----------+--------------+ FV Mid   Full           Yes      Yes                                 +---------+---------------+---------+-----------+----------+--------------+ FV DistalFull                                                         +---------+---------------+---------+-----------+----------+--------------+ PFV      Full                                                        +---------+---------------+---------+-----------+----------+--------------+ POP      Full           Yes      Yes                                 +---------+---------------+---------+-----------+----------+--------------+ PTV      Full                                                        +---------+---------------+---------+-----------+----------+--------------+ PERO     Full                                                        +---------+---------------+---------+-----------+----------+--------------+     Summary: BILATERAL: - No evidence of deep vein thrombosis seen in the lower extremities, bilaterally. -No evidence of popliteal cyst, bilaterally.   *See table(s) above for measurements and observations. Electronically signed by Debby Robertson on 05/16/2024 at 2:17:49 PM.    Final    US  Abdomen Limited RUQ (LIVER/GB) Result Date: 05/15/2024 CLINICAL DATA:  Right upper quadrant EXAM: ULTRASOUND ABDOMEN LIMITED RIGHT UPPER QUADRANT COMPARISON:  CT abdomen 05/15/2024 FINDINGS: Gallbladder: Surgically absent. Common bile duct: Diameter: Common bile duct is dilated measuring 2.1 cm. No definitive common bile duct stone seen, although evaluation of the distal common bile duct is limited secondary to overlying bowel gas. Liver: No focal  lesion identified. There is increase in parenchymal echogenicity. Portal vein is patent on color Doppler imaging with normal direction of blood flow towards the liver. Other: None. IMPRESSION: 1. Dilated common bile duct measuring up to 2.1 cm. No definitive common bile duct stone seen, although evaluation of the distal common bile duct is limited secondary to overlying bowel gas. Consider further evaluation with MRCP. 2. Hepatic steatosis. Electronically Signed   By: Greig Pique M.D.    On: 05/15/2024 20:29   CT ABDOMEN W WO CONTRAST Result Date: 05/15/2024 CLINICAL DATA:  Liver biopsy yesterday with new onset pain and vomiting. EXAM: CT ABDOMEN WITHOUT AND WITH CONTRAST TECHNIQUE: Multidetector CT imaging of the abdomen was performed following the standard protocol before and following the bolus administration of intravenous contrast. RADIATION DOSE REDUCTION: This exam was performed according to the departmental dose-optimization program which includes automated exposure control, adjustment of the mA and/or kV according to patient size and/or use of iterative reconstruction technique. CONTRAST:  100mL ISOVUE -300 IOPAMIDOL  (ISOVUE -300) INJECTION 61% COMPARISON:  CT biopsy images obtained yesterday 05/14/2024; recent prior CT scan of the chest, abdomen and pelvis 03/11/2024. FINDINGS: Lower chest: No acute abnormality. Hepatobiliary: Hypoechoic solid lesion in the subcapsular and anterior aspect of segment 5 is essentially unchanged at 1.8 x 1.6 cm. No evidence of hemorrhage. Surgical changes of prior cholecystectomy with significant intra and extrahepatic biliary ductal dilatation which appears unchanged. High attenuation stone in the distal common bile duct consistent with choledocholithiasis. Pancreas: Unremarkable. No pancreatic ductal dilatation or surrounding inflammatory changes. Spleen: Normal in size without focal abnormality. Adrenals/Urinary Tract: Normal left adrenal gland. Stable 1.2 cm left adrenal nodule. Complex cyst in the upper pole of the right kidney measures 3.2 cm and demonstrates internal Hounsfield units of approximately 40. This is similar to the unenhanced CT images from 03/11/2024 consistent with a hemorrhagic or proteinaceous cyst rather than an enhancing lesion. No definite enhancing lesion identified. Stomach/Bowel: No focal bowel wall thickening or evidence of obstruction. Vascular/Lymphatic: Scattered calcified atherosclerotic plaque. No evidence of aneurysm. No  suspicious lymphadenopathy. Other: Surgical changes in the right breast. Small fat containing ventral hernia. No evidence of ascites. Musculoskeletal: No acute fracture or aggressive appearing lytic or blastic osseous lesion. IMPRESSION: 1. No evidence of hemorrhage or other complication related to the recent liver biopsy. 2. Similar appearance of severe intra and extrahepatic biliary ductal dilatation with associated choledocholithiasis. Recommend referral to gastroenterology if not previously obtained. 3. Additional ancillary findings as above without interval change. Electronically Signed   By: Wilkie Lent M.D.   On: 05/15/2024 16:04    Assessment / Plan:  78 year old female with metastatic breast cancer hormone receptor positive, HER2 negative breast cancer with liver involvement, referred for liver biopsy on 05/14/2024, post-biopsy she reports appreciating pain, decreased oral intake nausea and vomiting which progressively got worst, follow-up imaging on 05/15/2024 showed no evidence of hemorrhage but showed severe intra and extrahepatic biliary ductal dilation associated with choledocholithiasis, biliary ultrasound showed bile duct dilating 2.1 cm no stones appreciated. Admitted 12/4 for ERCP. T.bili 5.1 -> 5.4. Alk phos 120 -> 126. AST 555 -> 438. ALT 906 -> 871.  INR 1.2. Mildly tachycardic and hypertensive. Temp 99.2 F, repeat temp 98.6 F. Started on Zosyn  IV this morning, at increased risk for ascending cholangitis. On NS IV at 100 cc an hour. - NPO  - IV fluids and pain management per the hospitalist  - ERCP possibly today vs tomorrow with Dr. Wilhelmenia  - Continue  Pantoprazole  40mg  daily  - Ondansetron  4mg  IV Q 6 hrs PRN  Leukopenia. WBC 2.4  Recent neutropenia. ANC 1.7.  History of DVT.  Not on oral anticoagulation.  Lovenox  ordered at 8 PM, will need to hold if ERCP done today.  GERD - Continue Pantoprazole  40 mg daily  Hypertension. Labetalol  IV as needed ordered per the  hospitalist.  DM type II.  Principal Problem:   Choledocholithiasis Active Problems:   Diabetes (HCC)   Hyperlipidemia associated with type 2 diabetes mellitus (HCC)   Essential hypertension   DVT, HX OF   Malignant neoplasm of upper-outer quadrant of right breast in female, estrogen receptor positive (HCC)   Transaminitis   Abnormal LFTs   History of liver biopsy   Abdominal pain, right upper quadrant   Bilious vomiting with nausea   Dilated bile duct     LOS: 1 day   [name]  05/17/2024, 11:50 PM

## 2024-05-17 NOTE — H&P (View-Only) (Signed)
 Gastroenterology Inpatient Consultation   Attending Requesting Consult Odell Celinda Balo, MD  [facility] Day: 3  Reason for Consult Abnormal LFTs, choledocholithiasis, abdominal pain    History of Present Illness  RASHA IBE is a 78 y.o. female with a pmh significant for Diabetes, hypertension, hyperlipidemia, arthritis, metastatic breast cancer (to liver), prior lung cancer, GERD, esophageal stenosis.  The GI service is consulted for evaluation and management of abdominal pain, abnormal LFTs, imaging concerning for choledocholithiasis.  This patient is followed by Dr. Abran and been seen in August with issues related to dysphagia and food impactions.  She eventually underwent an upper endoscopy showing evidence of an esophageal stenosis post dilation in August.  She followed up with her oncologist who obtained imaging in September.  There was a progressive liver lesion.  Decision made to better define receptor positivity of the liver lesion by performing a liver biopsy.  This was performed on 12/2 by interventional radiology.  This was a successful biopsy per IR notation.  She did have pain postprocedure but that improved.  She was able to be discharged.  She had progressive nausea and vomiting.  She underwent with interventional radiology recommendation a repeat CT scan with contrast which showed no evidence of active extravasation but did show evidence of ductal dilation and choledocholithiasis.  She was recommended to follow-up with GI but she came into the emergency department due to progressive symptoms.  There is notation in the chart that GI was consulted, GI inpatient as the long team was not notified until 4 PM 12/4 about her admission to the hospital.  She has continued to have nausea and vomiting and abdominal pain today when I have seen her.  Updated liver biochemical testing labs on 12/4 still pending completion at this point.  Her husband is with her today at the  bedside.  She is hemodynamically stable currently.  She is wanting to eat.  GI Review of Systems Positive as above occluding very mild dysphagia still Negative for melena, hematochezia  Review of Systems  General: Denies fevers/chills Cardiovascular: Denies chest pain Pulmonary: Denies shortness of breath Gastroenterological: See HPI Genitourinary: Denies darkened urine Hematological: Denies easy bruising/bleeding Dermatological: She reports she notices jaundice currently but had not noticed that previously Psychological: Mood is stable   Histories  Past Medical Past Medical History:  Diagnosis Date   Breast cancer (HCC)    Cancer (HCC) 02/2016   right breast   DIABETES MELLITUS, TYPE II 01/04/2007   only takes actoplus daily   Dizziness and giddiness 02/29/2008   DVT, HX OF    at age 5 in right buttocks   Dyspnea    due to lung cancer   Family history of breast cancer    GERD 01/04/2007   pt reports resolved    GLAUCOMA 07/30/2008   both eyes   History of blood transfusion    no abnormal  reaction   History of uterine cancer 2000   hysterectomy done   HYPERLIPIDEMIA 01/04/2007   taking Pravastatin  daily   HYPERTENSION 01/04/2007   takes Lisinopril  daily   Joint pain    Joint swelling    Leg cramps    LEG PAIN, LEFT 07/06/2007   NUMBNESS 07/30/2008   in fingers;pt states from Diamox    OSTEOARTHRITIS, HIP 09/25/2009   OTITIS MEDIA, ACUTE, BILATERAL 02/29/2008   Overweight(278.02) 01/04/2007   Peripheral vascular disease    Personal history of radiation therapy 2018   Pneumonia    PONV (  postoperative nausea and vomiting)    SLEEP APNEA, OBSTRUCTIVE    doesn't use a cpap;study done about 62yrs ago   TRANSIENT ISCHEMIC ATTACK, HX OF 01/04/2007   Vision loss    left eye   Past Surgical Past Surgical History:  Procedure Laterality Date   ABDOMINAL HYSTERECTOMY  2000   BREAST LUMPECTOMY Right 01/13/2017   x2   BREAST LUMPECTOMY WITH RADIOACTIVE SEED AND SENTINEL  LYMPH NODE BIOPSY Right 01/13/2017   Procedure: RIGHT BREAST RADIOACTIVE SEED X'S 2 GUIDED LUMPECTOMY WITH RADIOACTIVE SEED TARGETED AXILLARYLYMPH NODE EXCISION AND RIGHT AXILLARY SENTINEL LYMPH NODE BIOPSY;  Surgeon: Ethyl Lenis, MD;  Location: [facility] OR;  Service: General;  Laterality: Right;  2 SEEDS IN RIGHT BREAST 1 SEED IN RIGHT AXILLARY NODE   CHOLECYSTECTOMY     ENDOBRONCHIAL ULTRASOUND Bilateral 03/28/2016   Procedure: ENDOBRONCHIAL ULTRASOUND;  Surgeon: Lamar GORMAN Chris, MD;  Location: [facility];  Service: Cardiopulmonary;  Laterality: Bilateral;   EYE SURGERY  13   shunt left and lazer eye surgery on right cataract and retenia tear with repair   growth removal  2004   from thumb   KNEE ARTHROSCOPY Right    mulitple eye surgeries     both eyes, cataracts with ioc done both eyes   OOPHORECTOMY     right lumpectomy with axillary node dissection Right 01/2017   TOTAL HIP ARTHROPLASTY  06/24/2011   Procedure: TOTAL HIP ARTHROPLASTY;  Surgeon: Dempsey JINNY Sensor;  Location: [facility] OR;  Service: Orthopedics;  Laterality: Right;   TOTAL HIP ARTHROPLASTY Left 11/12/2012   Dr Sensor   TOTAL HIP ARTHROPLASTY Left 11/12/2012   Procedure: TOTAL HIP ARTHROPLASTY;  Surgeon: Dempsey JINNY Sensor, MD;  Location: [facility] OR;  Service: Orthopedics;  Laterality: Left;  DEPUY PINNACLE   Allergies Allergies  Allergen Reactions   Codeine  Hives    Hycodan syrup   Fluorescein  Nausea And Vomiting    ? IV dye for retina specialist   Oxycodone  Nausea And Vomiting    Patient vomited for 3 days after taking   Lipitor [Atorvastatin  Calcium ] Other (See Comments)    Leg cramp   Sitagliptin Phosphate Nausea And Vomiting   Sulfa Drugs Cross Reactors Nausea And Vomiting   Family Family History  Problem Relation Age of Onset   Breast cancer Mother 61   Dementia Mother    Cancer Mother        Breast and lung cancer   Stroke Sister    Breast cancer Sister 61   Heart attack Maternal Aunt    Lung cancer Maternal Grandmother         non smoker   Glaucoma Maternal Grandfather    Anesthesia problems Neg Hx    Colon cancer Neg Hx    Esophageal cancer Neg Hx    Rectal cancer Neg Hx    Stomach cancer Neg Hx    Social Social History   Socioeconomic History   Marital status: Married    Spouse name: Debby   Number of children: Not on file   Years of education: Not on file   Highest education level: Not on file  Occupational History   Occupation: physiological scientist    Employer: [employer]   Occupation: retired  Tobacco Use   Smoking status: Never   Smokeless tobacco: Never  Vaping Use   Vaping status: Never Used  Substance and Sexual Activity   Alcohol use: No   Drug use: No   Sexual activity: Not Currently  Other  Topics Concern   Not on file  Social History Narrative   Lives with husband/2025   Social Drivers of Health   Financial Resource Strain: Low Risk  (02/06/2023)   Overall Financial Resource Strain (CARDIA)    Difficulty of Paying Living Expenses: Not hard at all  Food Insecurity: No Food Insecurity (05/16/2024)   Hunger Vital Sign    Worried About Running Out of Food in the Last Year: Never true    Ran Out of Food in the Last Year: Never true  Transportation Needs: No Transportation Needs (05/16/2024)   PRAPARE - Administrator, Civil Service (Medical): No    Lack of Transportation (Non-Medical): No  Physical Activity: Inactive (05/02/2024)   Exercise Vital Sign    Days of Exercise per Week: 0 days    Minutes of Exercise per Session: 0 min  Stress: No Stress Concern Present (05/02/2024)   [employer] of Occupational Health - Occupational Stress Questionnaire    Feeling of Stress: Only a little  Social Connections: Socially Integrated (05/16/2024)   Social Connection and Isolation Panel    Frequency of Communication with Friends and Family: More than three times a week    Frequency of Social Gatherings with Friends and Family: Three times a week    Attends  Religious Services: More than 4 times per year    Active Member of Clubs or Organizations: Yes    Attends Banker Meetings: Never    Marital Status: Married  Catering Manager Violence: Not At Risk (05/16/2024)   Humiliation, Afraid, Rape, and Kick questionnaire    Fear of Current or Ex-Partner: No    Emotionally Abused: No    Physically Abused: No    Sexually Abused: No    Medications  Home Medications No current facility-administered medications on file prior to encounter.   Current Outpatient Medications on File Prior to Encounter  Medication Sig Dispense Refill   acetaminophen  (TYLENOL ) 500 MG tablet Take 1,000 mg by mouth every 4 (four) hours as needed for moderate pain or fever. (Patient taking differently: Take 1,000 mg by mouth every 6 (six) hours as needed for moderate pain (pain score 4-6) or fever.)     aspirin  EC 81 MG tablet Take 81 mg by mouth daily at 6 PM. 1700     Biotin 1 MG CAPS Take 1 capsule by mouth daily.     brimonidine  (ALPHAGAN ) 0.2 % ophthalmic solution INSTILL 1 DROP INTO EACH EYE THREE TIMES DAILY     cholecalciferol (VITAMIN D) 1000 units tablet Take 1,000 Units by mouth daily.     dorzolamide -timolol  (COSOPT ) 22.3-6.8 MG/ML ophthalmic solution Place 1 drop into both eyes 2 (two) times daily.     ketorolac  (ACULAR ) 0.5 % ophthalmic solution Place 1 drop into the right eye every morning.     lisinopril  (ZESTRIL ) 5 MG tablet Take 1 tablet by mouth once daily 90 tablet 0   lovastatin  (MEVACOR ) 20 MG tablet TAKE 1 TABLET BY MOUTH AT BEDTIME 90 tablet 3   Netarsudil -Latanoprost  (ROCKLATAN ) 0.02-0.005 % SOLN Place 1 drop into both eyes at bedtime.     palbociclib  (IBRANCE ) 75 MG tablet Take 1 tablet (75 mg total) by mouth daily. Take for 21 days on, 7 days off, repeat every 28 days. 21 tablet 11   pioglitazone -metformin  (ACTOPLUS MET ) 15-850 MG tablet Take 1 tablet by mouth daily. 90 tablet 3   prednisoLONE  acetate (PRED FORTE ) 1 % ophthalmic  suspension Place 1 drop into both  eyes as directed. INSTILL ONE DROP INTO BOTH EYES AS DIRECTED TWICE A DAY INTO RIGHT EYE FOUR TIMES A DAY IN LEFT EYE     Baclofen  5 MG TABS Take 0.5-1 tablets (2.5-5 mg total) by mouth 2 (two) times daily as needed. (Patient not taking: Reported on 05/16/2024) 60 tablet 0   gabapentin  (NEURONTIN ) 100 MG capsule Take 200 mg at bedtime, can increase to 300 mg at bedtime if tolerated 90 capsule 3   meloxicam  (MOBIC ) 15 MG tablet Take 1 tablet (15 mg total) by mouth daily. Take with food (Patient not taking: Reported on 05/16/2024) 30 tablet 0   ondansetron  (ZOFRAN -ODT) 4 MG disintegrating tablet Take 1 tablet (4 mg total) by mouth every 8 (eight) hours as needed for nausea or vomiting. 20 tablet 3   pantoprazole  (PROTONIX ) 40 MG tablet Take 1 tablet (40 mg total) by mouth daily. (Patient not taking: Reported on 05/16/2024) 30 tablet 11   Scheduled Inpatient Medications  brimonidine   1 drop Both Eyes TID   dorzolamide -timolol   1 drop Both Eyes BID   enoxaparin  (LOVENOX ) injection  40 mg Subcutaneous Q24H   insulin  aspart  0-9 Units Subcutaneous TID WC   ketorolac   1 drop Right Eye q morning   Netarsudil -Latanoprost   1 drop Ophthalmic QHS   prednisoLONE  acetate  2 drop Both Eyes QID   Continuous Inpatient Infusions  sodium chloride  100 mL/hr at 05/17/24 0508   PRN Inpatient Medications hydrALAZINE , HYDROmorphone  (DILAUDID ) injection, labetalol , ondansetron  (ZOFRAN ) IV   Physical Examination  BP (!) 157/74 (BP Location: Left Arm)   Pulse (!) 107   Temp 99.2 F (37.3 C) (Oral)   Resp 16   Ht 5' 4 (1.626 m)   Wt 104 kg   SpO2 94%   BMI 39.36 kg/m  GEN: NAD, appears stated age, doesn't appear chronically ill, husband at bedside PSYCH: Cooperative, without pressured speech EYE: Icteric sclerae ENT: MMM CV: Nontachycardic RESP: No audible wheezing GI: NABS, soft, [place] to abdomen, rounded, to palpation in the right upper quadrant region, additional  guarding is present, no rebound  MSK/EXT: No significant lower extremity edema SKIN: Jaundiced NEURO:  Alert & Oriented x 3, no focal deficits   Review of Data  I reviewed the following data at the time of this encounter:  Laboratory Studies   Recent Labs  Lab 05/16/24 2114  NA 140  K 3.5  CL 105  CO2 24  BUN 9  CREATININE 0.61  GLUCOSE 166*  CALCIUM  8.4*   Recent Labs  Lab 05/16/24 2114  AST 555*  ALT 906*  ALKPHOS 120    Recent Labs  Lab 05/14/24 1115 05/15/24 1853 05/17/24 0458  WBC 2.3* 4.2 2.4*  HGB 11.9* 13.8 11.3*  HCT 35.3* 39.4 34.7*  PLT 183 184 116*   Recent Labs  Lab 05/14/24 1115 05/17/24 0458  INR 1.1 1.2   MELD 3.0: 16 at 05/17/2024  4:58 AM MELD-Na: 15 at 05/17/2024  4:58 AM Calculated from: Serum Creatinine: 0.61 mg/dL (Using min of 1 mg/dL) at 87/10/7972  0:85 PM Serum Sodium: 140 mmol/L (Using max of 137 mmol/L) at 05/16/2024  9:14 PM Total Bilirubin: 5.1 mg/dL at 87/10/7972  0:85 PM Serum Albumin : 3.7 g/dL (Using max of 3.5 g/dL) at 87/10/7972  0:85 PM INR(ratio): 1.2 at 05/17/2024  4:58 AM Age at listing (hypothetical): 78 years Sex: Female at 05/17/2024  4:58 AM   Imaging Studies  September 2025 CT chest/abdomen/pelvis IMPRESSION: 1. Interval enlargement of a hypodense lesion  in the anterior liver dome, consistent with an enlarging metastasis. 2. Right lumpectomy with subpleural radiation fibrosis of the right pulmonary apex and anterior right lung. 3. Coronary artery disease.  05/14/2024 CT liver biopsy IMPRESSION: Technically successful CT guided core needle biopsy of right lobe liver mass.  05/15/2024 CT abdomen with/without contrast IMPRESSION: 1. No evidence of hemorrhage or other complication related to the recent liver biopsy. 2. Similar appearance of severe intra and extrahepatic biliary ductal dilatation with associated choledocholithiasis. Recommend referral to gastroenterology if not previously obtained. 3.  Additional ancillary findings as above without interval change.  05/15/2024 right upper quadrant ultrasound IMPRESSION: 1. Dilated common bile duct measuring up to 2.1 cm. No definitive common bile duct stone seen, although evaluation of the distal common bile duct is limited secondary to overlying bowel gas. Consider further evaluation with MRCP. 2. Hepatic steatosis.  This   GI Procedures and Studies  Upper endoscopy August 2025 1. GERD complicated by esophagitis and peptic stricture status post dilation 2. Hiatal hernia 3. Mild gastroduodenitis. Status post biopsies   Assessment  Ms. Eickhoff is a 78 y.o. female.  The GI service is consulted for evaluation and management of  LFTs, imaging concerning for choledocholithiasis with continued abdominal pain/nausea/vomiting.  The patient is hemodynamically stable at this time.  No evidence of overt cholangitis.  She does have evidence of what appears to be biliary sludge and debris duct.  It is interesting that this is only, on postprocedure 1 would query whether some of this may be blood products even if there has not been any evidence of extravasation seen.  In any case, ERCP is a reasonable next step for this individual.  Timing of ERCP is most likely going to be Saturday, but in the chance that we can fit her in on Friday n.p.o. midnight.  She is okay for full liquid diet today.  If we cannot accommodate her on Friday, she will be scheduled for Saturday.  The risks of an ERCP were discussed at length, including but not limited to the risk of perforation, bleeding, abdominal pain, post-ERCP pancreatitis (while usually mild can be severe and even life threatening).  The risks of an EUS including intestinal perforation, bleeding, infection, aspiration, and medication effects were discussed as was the possibility it may not give a definitive diagnosis if a biopsy is performed.  When a biopsy of the pancreas is done as part of the EUS, there is an  additional risk of pancreatitis at the rate of about 1-2%.  It was explained that procedure related pancreatitis is typically mild, although it can be severe and even life threatening, which is why we do not perform random pancreatic biopsies and only biopsy a lesion/area we feel is concerning enough to warrant the risk.  My team will follow-up with her tomorrow morning.  All patient questions were answered to the best of my ability, and the patient agrees to the aforementioned plan of action with follow-up as indicated.   Plan/Recommendations  Full liquid diet okay today N.p.o. at midnight Will work in to Friday morning to know whether ERCP availability can occur for Friday afternoon versus Saturday morning - If unable to accommodate ERCP on Friday and will allow patient to eat and be n.p.o. will communicate this by midmorning the inpatient team Repeat CBC/CMP/INR in the a.m. -She has had near neutropenia in the past and it would be helpful for us  to know whether this progresses requires other workup and evaluation as long as ANC  is greater than 500 we can perform endoscopic therapy safely otherwise there is an increased risk for infection - If further persisting neutropenia is present, consider antibiotics for prophylaxis of cholangitis Need to monitor   Thank you for this consult.  We will continue to follow.  Please page/call with questions or concerns.   Aloha Finner, MD [facility] Office # [phone]

## 2024-05-17 NOTE — Plan of Care (Signed)
   Problem: Coping: Goal: Ability to adjust to condition or change in health will improve Outcome: Progressing

## 2024-05-17 NOTE — Plan of Care (Incomplete)

## 2024-05-17 NOTE — Anesthesia Postprocedure Evaluation (Signed)
 Anesthesia Post Note  Patient: TABETHA HARAWAY  Procedure(s) Performed: ERCP, WITH INTERVENTION IF INDICATED SPHINCTEROTOMY, BILIARY ERCP, WITH LITHROTRIPSY OR REMOVAL OF COMMON BILE DUCT CALCULUS USING BALLOON BIOPSY, SKIN, SUBCUTANEOUS TISSUE, OR MUCOUS MEMBRANE INSERTION, STENT, BILE DUCT     Patient location during evaluation: Endoscopy Anesthesia Type: General Level of consciousness: awake and alert Pain management: pain level controlled Vital Signs Assessment: post-procedure vital signs reviewed and stable Respiratory status: spontaneous breathing, nonlabored ventilation, respiratory function stable and patient connected to nasal cannula oxygen  Cardiovascular status: blood pressure returned to baseline and stable Postop Assessment: no apparent nausea or vomiting Anesthetic complications: no   No notable events documented.  Last Vitals:  Vitals:   05/17/24 1515 05/17/24 1520  BP:  (!) 183/75  Pulse: 85 83  Resp: 10 (!) 8  Temp:    SpO2: 97% 97%    Last Pain:  Vitals:   05/17/24 1450  TempSrc: Temporal  PainSc: 0-No pain                 [place] Katich,W. EDMOND

## 2024-05-17 NOTE — Progress Notes (Signed)
 [facility] PROGRESS NOTE    Progress Note  [name]  FMW:[id number] DOB: 1945-07-02 DOA: 05/15/2024 PCP: Rollene Almarie LABOR, MD     Brief Narrative:   Shannon TROSTEL is an 78 y.o. female past medical history significant for remote history of DVT not on Eliquis, diabetes mellitus type 2, metastatic breast cancer hormone receptor positive, HER2 negative breast cancer with liver involvement , referred for  for liver biopsy on 05/14/2024, post-biopsy she reports appreciating pain, decreased oral intake nausea and vomiting which progressively got worst, follow-up imaging on 05/15/2024 showed no evidence of hemorrhage but showed severe intra and extrahepatic biliary ductal dilation associated with choledocholithiasis, biliary ultrasound showed bile duct dilating 2.1 cm no stones appreciated.  Assessment/Plan:   Nausea and vomiting possibly due to choledocholithiasis/dilated of common bile duct: With intrahepatic and extrapelvic biliary dilation by CT no stone appreciated.  Right upper quadrant ultrasound showed dilated common bile duct. GI was consulted recommended an ERCP on 05/17/2024. For n.p.o., continue IV fluids. Awaiting GI further recommendations.  Transaminitis/History of liver biopsy on 05/24/2024: With LFTs in the thousands and hyperbilirubinemia, she is status post recent liver biopsy for suspected metastatic lesions of the liver. Acute hepatitis panel was unremarkable. GIs been consulted.  Malignant neoplasm of the right upper quadrant of the breas hormone receptor positive: HER2 negative. Follow-up with oncology as an outpatient.  History of DVT: Noted she is no longer on anticoagulation.  Essential hypertension: Restarted back on her home dose of lisinopril  continue labetalol  and hydralazine  as needed.  Hyperlipidemia associated with diabetes mellitus: Holding statins.  Diabetes mellitus type 2: She is currently n.p.o. hold all oral hypoglycemic  agents. Start on sliding scale insulin  blood glucose is improving continue CBGs every 4 until she is able to tolerate her diet.  DVT prophylaxis: lovenox  Family Communication:husband Status is: Inpatient Remains inpatient appropriate because: Cholelithiasis nausea and vomiting    Code Status:     Code Status Orders  (From admission, onward)           Start     Ordered   05/16/24 2205  Full code  Continuous       Question:  By:  Answer:  Consent: discussion documented in EHR   05/16/24 2205           Code Status History     Date Active Date Inactive Code Status Order ID Comments User Context   05/14/2024 1614 05/15/2024 0510 Full Code [id number]  Jennefer Ester PARAS, MD HOV   07/22/2016 1409 07/23/2016 1403 Full Code [id number]  Nikki Hansel Atlas, MD ED   06/24/2011 1307 06/27/2011 2010 Full Code [id number]  Bohdan Cough, RN Inpatient      Advance Directive Documentation    Flowsheet Row Most Recent Value  Type of Advance Directive Healthcare Power of Attorney, Living will  Pre-existing out of facility DNR order (yellow form or pink MOST form) --  MOST Form in Place? --      IV Access:   Peripheral IV   Procedures and diagnostic studies:   VAS US  LOWER EXTREMITY VENOUS (DVT) Result Date: 05/16/2024  Lower Venous DVT Study Patient Name:  [name]  Date of Exam:   05/16/2024 Medical Rec #: [id number]      Accession #:    [id number] Date of Birth: 09-24-1945       Patient Gender: F Patient Age:   1 years Exam Location:  [facility] Procedure:      VAS US   LOWER EXTREMITY VENOUS (DVT) Referring Phys: ELSPETH NEWTON --------------------------------------------------------------------------------  Indications: Swelling, and Chronic swelling in both leg. Right worse than left.  Comparison Study: No priors. Performing Technologist: Ricka Sturdivant-Jones RDMS, RVT  Examination Guidelines: A complete evaluation includes B-mode imaging, spectral Doppler, color Doppler, and  power Doppler as needed of all accessible portions of each vessel. Bilateral testing is considered an integral part of a complete examination. Limited examinations for reoccurring indications may be performed as noted. The reflux portion of the exam is performed with the patient in reverse Trendelenburg.  +---------+---------------+---------+-----------+----------+--------------+ RIGHT    CompressibilityPhasicitySpontaneityPropertiesThrombus Aging +---------+---------------+---------+-----------+----------+--------------+ CFV      Full           Yes      Yes                                 +---------+---------------+---------+-----------+----------+--------------+ SFJ      Full                                                        +---------+---------------+---------+-----------+----------+--------------+ FV Prox  Full                                                        +---------+---------------+---------+-----------+----------+--------------+ FV Mid   Full           Yes      Yes                                 +---------+---------------+---------+-----------+----------+--------------+ FV DistalFull                                                        +---------+---------------+---------+-----------+----------+--------------+ PFV      Full                                                        +---------+---------------+---------+-----------+----------+--------------+ POP      Full           Yes      Yes                                 +---------+---------------+---------+-----------+----------+--------------+ PTV      Full                                                        +---------+---------------+---------+-----------+----------+--------------+ PERO     Full                                                        +---------+---------------+---------+-----------+----------+--------------+    +---------+---------------+---------+-----------+----------+--------------+  LEFT     CompressibilityPhasicitySpontaneityPropertiesThrombus Aging +---------+---------------+---------+-----------+----------+--------------+ CFV      Full           Yes      Yes                                 +---------+---------------+---------+-----------+----------+--------------+ SFJ      Full                                                        +---------+---------------+---------+-----------+----------+--------------+ FV Prox  Full                                                        +---------+---------------+---------+-----------+----------+--------------+ FV Mid   Full           Yes      Yes                                 +---------+---------------+---------+-----------+----------+--------------+ FV DistalFull                                                        +---------+---------------+---------+-----------+----------+--------------+ PFV      Full                                                        +---------+---------------+---------+-----------+----------+--------------+ POP      Full           Yes      Yes                                 +---------+---------------+---------+-----------+----------+--------------+ PTV      Full                                                        +---------+---------------+---------+-----------+----------+--------------+ PERO     Full                                                        +---------+---------------+---------+-----------+----------+--------------+     Summary: BILATERAL: - No evidence of deep vein thrombosis seen in the lower extremities, bilaterally. -No evidence of popliteal cyst, bilaterally.   *See table(s) above for measurements and observations. Electronically signed by Debby Robertson on 05/16/2024 at 2:17:49 PM.    Final    US  Abdomen Limited  RUQ (LIVER/GB) Result Date:  05/15/2024 CLINICAL DATA:  Right upper quadrant EXAM: ULTRASOUND ABDOMEN LIMITED RIGHT UPPER QUADRANT COMPARISON:  CT abdomen 05/15/2024 FINDINGS: Gallbladder: Surgically absent. Common bile duct: Diameter: Common bile duct is dilated measuring 2.1 cm. No definitive common bile duct stone seen, although evaluation of the distal common bile duct is limited secondary to overlying bowel gas. Liver: No focal lesion identified. There is increase in parenchymal echogenicity. Portal vein is patent on color Doppler imaging with normal direction of blood flow towards the liver. Other: None. IMPRESSION: 1. Dilated common bile duct measuring up to 2.1 cm. No definitive common bile duct stone seen, although evaluation of the distal common bile duct is limited secondary to overlying bowel gas. Consider further evaluation with MRCP. 2. Hepatic steatosis. Electronically Signed   By: Greig Pique M.D.   On: 05/15/2024 20:29   CT ABDOMEN W WO CONTRAST Result Date: 05/15/2024 CLINICAL DATA:  Liver biopsy yesterday with new onset pain and vomiting. EXAM: CT ABDOMEN WITHOUT AND WITH CONTRAST TECHNIQUE: Multidetector CT imaging of the abdomen was performed following the standard protocol before and following the bolus administration of intravenous contrast. RADIATION DOSE REDUCTION: This exam was performed according to the departmental dose-optimization program which includes automated exposure control, adjustment of the mA and/or kV according to patient size and/or use of iterative reconstruction technique. CONTRAST:  ISOVUE -300 IOPAMIDOL  (ISOVUE -300) INJECTION 61% COMPARISON:  CT biopsy images obtained yesterday 05/14/2024; recent prior CT scan of the chest, abdomen and pelvis 03/11/2024. FINDINGS: Lower chest: No acute abnormality. Hepatobiliary: Hypoechoic solid lesion in the subcapsular and anterior aspect of segment 5 is essentially unchanged at 1.8 x 1.6 cm. No evidence of hemorrhage. Surgical changes of prior  cholecystectomy with significant intra and extrahepatic biliary ductal dilatation which appears unchanged. High attenuation stone in the distal common bile duct consistent with choledocholithiasis. Pancreas: Unremarkable. No pancreatic ductal dilatation or surrounding inflammatory changes. Spleen: Normal in size without focal abnormality. Adrenals/Urinary Tract: Normal left adrenal gland. Stable 1.2 cm left adrenal nodule. Complex cyst in the upper pole of the right kidney measures 3.2 cm and demonstrates internal Hounsfield units of approximately 40. This is similar to the unenhanced CT images from 03/11/2024 consistent with a hemorrhagic or proteinaceous cyst rather than an enhancing lesion. No definite enhancing lesion identified. Stomach/Bowel: No focal bowel wall thickening or evidence of obstruction. Vascular/Lymphatic: Scattered calcified atherosclerotic plaque. No evidence of aneurysm. No suspicious lymphadenopathy. Other: Surgical changes in the right breast. Small fat containing ventral hernia. No evidence of ascites. Musculoskeletal: No acute fracture or aggressive appearing lytic or blastic osseous lesion. IMPRESSION: 1. No evidence of hemorrhage or other complication related to the recent liver biopsy. 2. Similar appearance of severe intra and extrahepatic biliary ductal dilatation with associated choledocholithiasis. Recommend referral to gastroenterology if not previously obtained. 3. Additional ancillary findings as above without interval change. Electronically Signed   By: Wilkie Lent M.D.   On: 05/15/2024 16:04     Medical Consultants:   None.   Subjective:    KENADIE ROYCE relates her pain is controlled no further nausea.  Objective:    Vitals:   05/16/24 2053 05/16/24 2231 05/17/24 0129 05/17/24 0530  BP: (!) 198/90 (!) 184/88 (!) 166/94 (!) 157/74  Pulse: 90 (!) 101 (!) 104 (!) 107  Resp: 15  16 16   Temp: 98 F (36.7 C)  98.5 F (36.9 C) 99.2 F (37.3 C)  TempSrc:  Oral  Oral Oral  SpO2: 93%  97%  94%  Weight:      Height:       SpO2: 94 % O2 Flow Rate (L/min): 2 L/min   Intake/Output Summary (Last 24 hours) at 05/17/2024 0724 Last data filed at 05/17/2024 0600 Gross per 24 hour  Intake 2176.07 ml  Output 1300 ml  Net 876.07 ml   Filed Weights   05/15/24 1815  Weight: 104 kg    Exam: General exam: In no acute distress. Respiratory system: Good air movement and clear to auscultation. Cardiovascular system: S1 & S2 heard, RRR.  Gastrointestinal system: Abdomen is nondistended, soft and right upper quadrant tenderness. Extremities: No pedal edema. Skin: No rashes, lesions or ulcers Psychiatry: Judgement and insight appear normal. Mood & affect appropriate.    Data Reviewed:    Labs: Basic Metabolic Panel: Recent Labs  Lab 05/15/24 1853 05/16/24 2114 05/17/24 0458  NA 138 140 139  K 3.8 3.5 3.5  CL 100 105 105  CO2 24 24 23   GLUCOSE 279* 166* 152*  BUN 9 9 9   CREATININE 0.77 0.61 0.54  CALCIUM  9.4 8.4* 8.7*   GFR Estimated Creatinine Clearance: 68.1 mL/min (by C-G formula based on SCr of 0.54 mg/dL). Liver Function Tests: Recent Labs  Lab 05/15/24 1853 05/16/24 2114 05/17/24 0458  AST 913* 555* 438*  ALT 969* 906* 871*  ALKPHOS 127* 120 126  BILITOT 5.7* 5.1* 5.4*  PROT 7.6 6.0* 5.9*  ALBUMIN  4.6 3.7 3.8   Recent Labs  Lab 05/15/24 1853  LIPASE 153*   No results for input(s): AMMONIA in the last 168 hours. Coagulation profile Recent Labs  Lab 05/14/24 1115 05/17/24 0458  INR 1.1 1.2   COVID-19 Labs  No results for input(s): DDIMER, FERRITIN, LDH, CRP in the last 72 hours.  Lab Results  Component Value Date   SARSCOV2NAA Not Detected 02/26/2020   SARSCOV2NAA Not Detected 03/20/2019   SARSCOV2NAA Not Detected 11/29/2018    CBC: Recent Labs  Lab 05/14/24 1115 05/15/24 1853 05/17/24 0458  WBC 2.3* 4.2 2.4*  HGB 11.9* 13.8 11.3*  HCT 35.3* 39.4 34.7*  MCV 96.4 93.6 100.0  PLT 183  184 116*   Cardiac Enzymes: No results for input(s): CKTOTAL, CKMB, CKMBINDEX, TROPONINI in the last 168 hours. BNP (last 3 results) No results for input(s): PROBNP in the last 8760 hours. CBG: Recent Labs  Lab 05/16/24 0024 05/16/24 0813 05/16/24 1244 05/16/24 1702 05/16/24 2000  GLUCAP 245* 206* 139* 134* 194*   D-Dimer: No results for input(s): DDIMER in the last 72 hours. Hgb A1c: No results for input(s): HGBA1C in the last 72 hours. Lipid Profile: No results for input(s): CHOL, HDL, LDLCALC, TRIG, CHOLHDL, LDLDIRECT in the last 72 hours. Thyroid  function studies: No results for input(s): TSH, T4TOTAL, T3FREE, THYROIDAB in the last 72 hours.  Invalid input(s): FREET3 Anemia work up: No results for input(s): VITAMINB12, FOLATE, FERRITIN, TIBC, IRON, RETICCTPCT in the last 72 hours. Sepsis Labs: Recent Labs  Lab 05/14/24 1115 05/15/24 1853 05/15/24 1908 05/15/24 1924 05/17/24 0458  WBC 2.3* 4.2  --   --  2.4*  LATICACIDVEN  --   --  1.7 1.9  --    Microbiology No results found for this or any previous visit (from the past 240 hours).   Medications:    brimonidine   1 drop Both Eyes TID   dorzolamide -timolol   1 drop Both Eyes BID   enoxaparin  (LOVENOX ) injection  40 mg Subcutaneous Q24H   insulin  aspart  0-9 Units Subcutaneous TID WC  ketorolac   1 drop Right Eye q morning   Netarsudil -Latanoprost   1 drop Ophthalmic QHS   prednisoLONE  acetate  2 drop Both Eyes QID   Continuous Infusions:  sodium chloride  100 mL/hr at 05/17/24 0508      LOS: 1 day   Erle Odell Castor  [facility]  05/17/2024, 7:24 AM

## 2024-05-17 NOTE — Interval H&P Note (Signed)
 History and Physical Interval Note:  05/17/2024 1:25 PM  [name]  has presented today for surgery, with the diagnosis of Choledocholithiasis.  The various methods of treatment have been discussed with the patient and family. After consideration of risks, benefits and other options for treatment, the patient has consented to  Procedure(s): ERCP, WITH INTERVENTION IF INDICATED (N/A) as a surgical intervention.  The patient's history has been reviewed, patient examined, no change in status, stable for surgery.  I have reviewed the patient's chart and labs.  Questions were answered to the patient's satisfaction.    The risks of an ERCP were discussed at length, including but not limited to the risk of perforation, bleeding, abdominal pain, post-ERCP pancreatitis (while usually mild can be severe and even life threatening).    Edna Rede Mansouraty Jr

## 2024-05-17 NOTE — Progress Notes (Signed)
 Per MD request, RN called pathology as well as sent an email to [facility] to add breast prognostic panel (ER/PR/ HER 2) to recent biopsy preformed 05/17/24.  [facility] verbalized understanding and will ensure panel is added.

## 2024-05-18 ENCOUNTER — Other Ambulatory Visit (HOSPITAL_COMMUNITY): Payer: Self-pay

## 2024-05-18 DIAGNOSIS — K8050 Calculus of bile duct without cholangitis or cholecystitis without obstruction: Secondary | ICD-10-CM | POA: Diagnosis not present

## 2024-05-18 LAB — CBC WITH DIFFERENTIAL/PLATELET
Abs Immature Granulocytes: 0.04 K/uL (ref 0.00–0.07)
Basophils Absolute: 0 K/uL (ref 0.0–0.1)
Basophils Relative: 0 %
Eosinophils Absolute: 0 K/uL (ref 0.0–0.5)
Eosinophils Relative: 0 %
HCT: 33.3 % — ABNORMAL LOW (ref 36.0–46.0)
Hemoglobin: 11 g/dL — ABNORMAL LOW (ref 12.0–15.0)
Immature Granulocytes: 2 %
Lymphocytes Relative: 17 %
Lymphs Abs: 0.5 K/uL — ABNORMAL LOW (ref 0.7–4.0)
MCH: 32.4 pg (ref 26.0–34.0)
MCHC: 33 g/dL (ref 30.0–36.0)
MCV: 97.9 fL (ref 80.0–100.0)
Monocytes Absolute: 0.3 K/uL (ref 0.1–1.0)
Monocytes Relative: 11 %
Neutro Abs: 1.9 K/uL (ref 1.7–7.7)
Neutrophils Relative %: 70 %
Platelets: 121 K/uL — ABNORMAL LOW (ref 150–400)
RBC: 3.4 MIL/uL — ABNORMAL LOW (ref 3.87–5.11)
RDW: 14.5 % (ref 11.5–15.5)
WBC: 2.7 K/uL — ABNORMAL LOW (ref 4.0–10.5)
nRBC: 0 % (ref 0.0–0.2)

## 2024-05-18 LAB — COMPREHENSIVE METABOLIC PANEL WITH GFR
ALT: 636 U/L — ABNORMAL HIGH (ref 0–44)
AST: 194 U/L — ABNORMAL HIGH (ref 15–41)
Albumin: 3.6 g/dL (ref 3.5–5.0)
Alkaline Phosphatase: 126 U/L (ref 38–126)
Anion gap: 10 (ref 5–15)
BUN: 12 mg/dL (ref 8–23)
CO2: 24 mmol/L (ref 22–32)
Calcium: 8.7 mg/dL — ABNORMAL LOW (ref 8.9–10.3)
Chloride: 104 mmol/L (ref 98–111)
Creatinine, Ser: 0.66 mg/dL (ref 0.44–1.00)
GFR, Estimated: >60 mL/min (ref >60)
Glucose, Bld: 223 mg/dL — ABNORMAL HIGH (ref 70–99)
Potassium: 3.7 mmol/L (ref 3.5–5.1)
Sodium: 138 mmol/L (ref 135–145)
Total Bilirubin: 2 mg/dL — ABNORMAL HIGH (ref 0.0–1.2)
Total Protein: 5.7 g/dL — ABNORMAL LOW (ref 6.5–8.1)

## 2024-05-18 LAB — HEPATITIS PANEL, ACUTE
HCV Ab: NONREACTIVE
Hep A IgM: NONREACTIVE
Hep B C IgM: NONREACTIVE
Hepatitis B Surface Ag: NONREACTIVE

## 2024-05-18 LAB — GLUCOSE, CAPILLARY
Glucose-Capillary: 207 mg/dL — ABNORMAL HIGH (ref 70–99)
Glucose-Capillary: 180 mg/dL — ABNORMAL HIGH (ref 70–99)

## 2024-05-18 MED ORDER — TRAMADOL HCL 50 MG PO TABS
50.0000 mg | ORAL_TABLET | Freq: Four times a day (QID) | ORAL | 0 refills | Status: AC | PRN
  Filled 2024-05-18: qty 20, 5d supply, fill #0

## 2024-05-18 MED ORDER — AMOXICILLIN-POT CLAVULANATE 875-125 MG PO TABS: 1.0000 | ORAL_TABLET | Freq: Two times a day (BID) | ORAL | Status: DC

## 2024-05-18 NOTE — Evaluation (Signed)
 Physical Therapy Evaluation Patient Details Name: [name] MRN: [id number] DOB: 1945-11-25 Today's Date: 05/18/2024  History of Present Illness  78 y.o. female admitted with transaminitis, choledocholithiasis, abdominal pain. The GI service is consulted for evaluation and management of abdominal pain, abnormal LFTs, imaging concerning for choledocholithiasis. pt underwent ERCP on 05/17/24 pmh significant for Diabetes, hypertension, hyperlipidemia, arthritis, metastatic breast cancer (to liver), prior lung cancer, GERD, esophageal stenosis.  Clinical Impression  Patient evaluated by Physical Therapy with no further acute PT needs identified. All education has been completed and the patient has no further questions.  Pt independent at baseline, motivated to work with PT today. Amb 120' without LOB, no assist or device needed. Pt spouse present for session, supportive and able to assist as needed.  No further PT indicated at this time.   See below for any follow-up Physical Therapy or equipment needs. PT is signing off. Thank you for this referral.         If plan is discharge home, recommend the following: Help with stairs or ramp for entrance   Can travel by private vehicle        Equipment Recommendations None recommended by PT  Recommendations for Other Services       Functional Status Assessment Patient has not had a recent decline in their functional status     Precautions / Restrictions Precautions Precautions: Fall Restrictions Weight Bearing Restrictions Per Provider Order: No      Mobility  Bed Mobility               General bed mobility comments: pt in recliner and returned to same    Transfers Overall transfer level: Needs assistance   Transfers: Sit to/from Stand Sit to Stand: Supervision, Modified independent (Device/Increase time)                Ambulation/Gait Ambulation/Gait assistance: Supervision, Modified independent (Device/Increase  time) Gait Distance (Feet): 120 Feet Assistive device: None Gait Pattern/deviations: Decreased step length - right, Decreased step length - left, Wide base of support Gait velocity: decr but functional     General Gait Details: initial supervision for safety; no LOB; decr joint excursion throughout bil LEs during gait cycle  Stairs            Wheelchair Mobility     Tilt Bed    Modified Rankin (Stroke Patients Only)       Balance Overall balance assessment: Mild deficits observed, not formally tested                                           Pertinent Vitals/Pain Pain Assessment Pain Assessment: No/denies pain    Home Living Family/patient expects to be discharged to:: Private residence Living Arrangements: Spouse/significant other Available Help at Discharge: Family;Available 24 hours/day Type of Home: House Home Access: Stairs to enter Entrance Stairs-Rails: Right Entrance Stairs-Number of Steps: 2   Home Layout: One level Home Equipment: Pharmacist, Hospital (2 wheels);Cane - single point;Adaptive equipment      Prior Function Prior Level of Function : Independent/Modified Independent             Mobility Comments: ind ADLs Comments: ind ADLs,  spouse assist with LB ADLs and IADLs     Extremity/Trunk Assessment   Upper Extremity Assessment Upper Extremity Assessment: Defer to OT evaluation    Lower Extremity Assessment Lower Extremity Assessment:  Overall WFL for tasks assessed       Communication   Communication Communication: No apparent difficulties    Cognition Arousal: Alert Behavior During Therapy: WFL for tasks assessed/performed   PT - Cognitive impairments: No apparent impairments                         Following commands: Intact       Cueing Cueing Techniques: Verbal cues     General Comments General comments (skin integrity, edema, etc.): spouse at side and supportive     Exercises     Assessment/Plan    PT Assessment Patient does not need any further PT services  PT Problem List         PT Treatment Interventions      PT Goals (Current goals can be found in the Care Plan section)  Acute Rehab PT Goals PT Goal Formulation: All assessment and education complete, DC therapy    Frequency       Co-evaluation               AM-PAC PT 6 Clicks Mobility  Outcome Measure Help needed turning from your back to your side while in a flat bed without using bedrails?: None Help needed moving from lying on your back to sitting on the side of a flat bed without using bedrails?: None Help needed moving to and from a bed to a chair (including a wheelchair)?: None Help needed standing up from a chair using your arms (e.g., wheelchair or bedside chair)?: None Help needed to walk in hospital room?: None Help needed climbing 3-5 steps with a railing? : A Little 6 Click Score: 23    End of Session   Activity Tolerance: Patient tolerated treatment well Patient left: with call bell/phone within reach;with chair alarm set;with nursing/sitter in room   PT Visit Diagnosis: Other abnormalities of gait and mobility (R26.89)    Time: 8864-8854 PT Time Calculation (min) (ACUTE ONLY): 10 min   Charges:   PT Evaluation $PT Eval Low Complexity: 1 Low   PT General Charges $$ ACUTE PT VISIT: 1 Visit         [facility], PT  Acute Rehab Dept [facility]) [phone]  05/18/2024   [facility] 05/18/2024, 12:50 PM

## 2024-05-18 NOTE — Evaluation (Signed)
 Occupational Therapy Evaluation Patient Details Name: [name] MRN: [id number] DOB: Nov 01, 1945 Today's Date: 05/18/2024   History of Present Illness   78 y.o. female admitted with transaminitis, choledocholithiasis, abdominal pain. The GI service is consulted for evaluation and management of abdominal pain, abnormal LFTs, imaging concerning for choledocholithiasis. pt underwent ERCP on 05/17/24 pmh significant for Diabetes, hypertension, hyperlipidemia, arthritis, metastatic breast cancer (to liver), prior lung cancer, GERD, esophageal stenosis.     Clinical Impressions PTA patient reports living with her spouse who assists with LB ADLs and IADLs, but otherwise independent for ADLs and mobility without AD.  Admitted for above and presents near baseline for ADLs and functional mobility.  Mobilizing in room with supervision (due to IV pole) and needing min assist for LB (baseline).  Pt has good support at home from spouse and has DME needed already.  No further OT needs identified at this time. OT will sign off.      If plan is discharge home, recommend the following:   A little help with bathing/dressing/bathroom;Assistance with cooking/housework;Assist for transportation;Help with stairs or ramp for entrance     Functional Status Assessment   Patient has not had a recent decline in their functional status     Equipment Recommendations   None recommended by OT     Recommendations for Other Services         Precautions/Restrictions   Precautions Precautions: Fall Recall of Precautions/Restrictions: Intact Restrictions Weight Bearing Restrictions Per Provider Order: No     Mobility Bed Mobility Overal bed mobility: Modified Independent             General bed mobility comments: HOB elevated but no assist required, typically sleeps in recliner    Transfers Overall transfer level: Needs assistance Equipment used: None Transfers: Sit to/from Stand Sit to  Stand: Supervision           General transfer comment: for safety and IV Pole mgmt, but no assist required      Balance Overall balance assessment: Mild deficits observed, not formally tested                                         ADL either performed or assessed with clinical judgement   ADL Overall ADL's : At baseline (pt mobilizing in room with supervision due to IV pole, but not needing physical assistance.  appears at baseline for ADLs, spouse assisting with LB as needed)                                             Vision   Vision Assessment?: No apparent visual deficits     Perception         Praxis         Pertinent Vitals/Pain Pain Assessment Pain Assessment: No/denies pain     Extremity/Trunk Assessment Upper Extremity Assessment Upper Extremity Assessment: Generalized weakness   Lower Extremity Assessment Lower Extremity Assessment: Defer to PT evaluation       Communication Communication Communication: No apparent difficulties   Cognition Arousal: Alert Behavior During Therapy: WFL for tasks assessed/performed Cognition: No apparent impairments  Following commands: Intact       Cueing  General Comments   Cueing Techniques: Verbal cues  spouse at side and supportive   Exercises     Shoulder Instructions      Home Living Family/patient expects to be discharged to:: Private residence Living Arrangements: Spouse/significant other Available Help at Discharge: Family;Available 24 hours/day Type of Home: House Home Access: Stairs to enter [employer] of Steps: 2 Entrance Stairs-Rails: Right Home Layout: One level     Bathroom Shower/Tub: Producer, Television/film/video: Handicapped height     Home Equipment: Pharmacist, Hospital (2 wheels);Cane - single point;Adaptive equipment Adaptive Equipment: Reacher;Sock aid        Prior  Functioning/Environment Prior Level of Function : Independent/Modified Independent             Mobility Comments: ind ADLs Comments: ind ADLs,  spouse assist with LB ADLs and IADLs    OT Problem List: Decreased activity tolerance   OT Treatment/Interventions:        OT Goals(Current goals can be found in the care plan section)   Acute Rehab OT Goals Patient Stated Goal: home today OT Goal Formulation: With patient   OT Frequency:       Co-evaluation              AM-PAC OT 6 Clicks Daily Activity     Outcome Measure Help from another person eating meals?: None Help from another person taking care of personal grooming?: None Help from another person toileting, which includes using toliet, bedpan, or urinal?: A Little Help from another person bathing (including washing, rinsing, drying)?: A Little Help from another person to put on and taking off regular upper body clothing?: A Little Help from another person to put on and taking off regular lower body clothing?: A Little 6 Click Score: 20   End of Session Nurse Communication: Mobility status  Activity Tolerance: Patient tolerated treatment well Patient left: in chair;with call bell/phone within reach;with family/visitor present  OT Visit Diagnosis: Other abnormalities of gait and mobility (R26.89);Muscle weakness (generalized) (M62.81)                Time: 8889-8875 OT Time Calculation (min): 14 min Charges:  OT General Charges $OT Visit: 1 Visit OT Evaluation $OT Eval Low Complexity: 1 Low  Etta NOVAK, OT Acute Rehabilitation Services Office [phone] Secure Chat Preferred    Etta GORMAN Hope 05/18/2024, 11:35 AM

## 2024-05-18 NOTE — Plan of Care (Signed)
 Problem: Education: Goal: Ability to describe self-care measures that may prevent or decrease complications (Diabetes Survival Skills Education) will improve Outcome: Adequate for Discharge Goal: Individualized Educational Video(s) Outcome: Adequate for Discharge   Problem: Coping: Goal: Ability to adjust to condition or change in health will improve Outcome: Adequate for Discharge   Problem: Fluid Volume: Goal: Ability to maintain a balanced intake and output will improve Outcome: Adequate for Discharge   Problem: Health Behavior/Discharge Planning: Goal: Ability to identify and utilize available resources and services will improve Outcome: Adequate for Discharge Goal: Ability to manage health-related needs will improve Outcome: Adequate for Discharge   Problem: Metabolic: Goal: Ability to maintain appropriate glucose levels will improve Outcome: Adequate for Discharge   Problem: Nutritional: Goal: Maintenance of adequate nutrition will improve Outcome: Adequate for Discharge Goal: Progress toward achieving an optimal weight will improve Outcome: Adequate for Discharge   Problem: Skin Integrity: Goal: Risk for impaired skin integrity will decrease Outcome: Adequate for Discharge   Problem: Tissue Perfusion: Goal: Adequacy of tissue perfusion will improve Outcome: Adequate for Discharge   Problem: Education: Goal: Knowledge of General Education information will improve Description: Including pain rating scale, medication(s)/side effects and non-pharmacologic comfort measures Outcome: Adequate for Discharge   Problem: Health Behavior/Discharge Planning: Goal: Ability to manage health-related needs will improve Outcome: Adequate for Discharge   Problem: Clinical Measurements: Goal: Ability to maintain clinical measurements within normal limits will improve Outcome: Adequate for Discharge Goal: Will remain free from infection Outcome: Adequate for Discharge Goal:  Diagnostic test results will improve Outcome: Adequate for Discharge Goal: Respiratory complications will improve Outcome: Adequate for Discharge Goal: Cardiovascular complication will be avoided Outcome: Adequate for Discharge   Problem: Activity: Goal: Risk for activity intolerance will decrease Outcome: Adequate for Discharge   Problem: Nutrition: Goal: Adequate nutrition will be maintained Outcome: Adequate for Discharge   Problem: Coping: Goal: Level of anxiety will decrease Outcome: Adequate for Discharge   Problem: Elimination: Goal: Will not experience complications related to bowel motility Outcome: Adequate for Discharge Goal: Will not experience complications related to urinary retention Outcome: Adequate for Discharge   Problem: Pain Managment: Goal: General experience of comfort will improve and/or be controlled Outcome: Adequate for Discharge   Problem: Safety: Goal: Ability to remain free from injury will improve Outcome: Adequate for Discharge   Problem: Skin Integrity: Goal: Risk for impaired skin integrity will decrease Outcome: Adequate for Discharge   Problem: Education: Goal: Knowledge of General Education information will improve Description: Including pain rating scale, medication(s)/side effects and non-pharmacologic comfort measures Outcome: Adequate for Discharge   Problem: Health Behavior/Discharge Planning: Goal: Ability to manage health-related needs will improve Outcome: Adequate for Discharge   Problem: Clinical Measurements: Goal: Ability to maintain clinical measurements within normal limits will improve Outcome: Adequate for Discharge Goal: Will remain free from infection Outcome: Adequate for Discharge Goal: Diagnostic test results will improve Outcome: Adequate for Discharge Goal: Respiratory complications will improve Outcome: Adequate for Discharge Goal: Cardiovascular complication will be avoided Outcome: Adequate for  Discharge   Problem: Activity: Goal: Risk for activity intolerance will decrease Outcome: Adequate for Discharge   Problem: Nutrition: Goal: Adequate nutrition will be maintained Outcome: Adequate for Discharge   Problem: Coping: Goal: Level of anxiety will decrease Outcome: Adequate for Discharge   Problem: Elimination: Goal: Will not experience complications related to bowel motility Outcome: Adequate for Discharge Goal: Will not experience complications related to urinary retention Outcome: Adequate for Discharge   Problem: Pain Managment: Goal: General  experience of comfort will improve and/or be controlled Outcome: Adequate for Discharge   Problem: Safety: Goal: Ability to remain free from injury will improve Outcome: Adequate for Discharge   Problem: Skin Integrity: Goal: Risk for impaired skin integrity will decrease Outcome: Adequate for Discharge

## 2024-05-18 NOTE — TOC Transition Note (Signed)
 Transition of Care [facility]) - Discharge Note   Patient Details  Name: [name] MRN: [id number] Date of Birth: 1946-03-03  [facility] CM/SW Contact:  Sonda Manuella Quill, RN Phone Number: 05/18/2024, 3:58 PM   Clinical Narrative:    D/C orders received; no IP CM needs   Final next level of care: Home/Self Care Barriers to Discharge: No Barriers Identified   Patient Goals and CMS Choice Patient states their goals for this hospitalization and ongoing recovery are:: return home          Discharge Placement                       Discharge Plan and Services Additional resources added to the After Visit Summary for                  DME Arranged: N/A DME Agency: NA       HH Arranged: NA HH Agency: NA        Social Drivers of Health (SDOH) Interventions SDOH Screenings   Food Insecurity: No Food Insecurity (05/16/2024)  Housing: Low Risk  (05/16/2024)  Transportation Needs: No Transportation Needs (05/16/2024)  Utilities: Not At Risk (05/16/2024)  Alcohol Screen: Low Risk  (02/25/2022)  Depression (PHQ2-9): Low Risk  (05/02/2024)  Financial Resource Strain: Low Risk  (02/06/2023)  Physical Activity: Inactive (05/02/2024)  Social Connections: Socially Integrated (05/16/2024)  Stress: No Stress Concern Present (05/02/2024)  Tobacco Use: Low Risk  (05/17/2024)  Health Literacy: Adequate Health Literacy (05/02/2024)     Readmission Risk Interventions    05/17/2024    1:31 PM  Readmission Risk Prevention Plan  Transportation Screening Complete  PCP or Specialist Appt within 5-7 Days Complete  Home Care Screening Complete  Medication Review (RN CM) Complete

## 2024-05-18 NOTE — Discharge Summary (Addendum)
 Physician Discharge Summary  [name] FMW:[id number] DOB: [birth year] DOA: 05/15/2024  PCP: [name] [name]  Admit date: 05/15/2024 Discharge date: 05/18/2024  Admitted From: Home Disposition:  home  Recommendations for Outpatient Follow-up:  Follow up with PCP in 1-2 weeks Please obtain BMP/CBC in one week   Home Health:No Equipment/Devices:None  Discharge Condition:Stable CODE STATUS:Full Diet recommendation: Heart Healthy   Brief/Interim Summary: 78 y.o. female past medical history significant for remote history of DVT not on Eliquis, diabetes mellitus type 2, metastatic breast cancer hormone receptor positive, HER2 negative breast cancer with liver involvement , referred for  for liver biopsy on 05/14/2024, post-biopsy she reports appreciating pain, decreased oral intake nausea and vomiting which progressively got worst, follow-up imaging on 05/15/2024 showed no evidence of hemorrhage but showed severe intra and extrahepatic biliary ductal dilation associated with choledocholithiasis, biliary ultrasound showed bile duct dilating 2.1 cm no stones appreciated.   Discharge Diagnoses:  Principal Problem:   Choledocholithiasis Active Problems:   Transaminitis   Diabetes (HCC)   Hyperlipidemia associated with type 2 diabetes mellitus (HCC)   Essential hypertension   DVT, HX OF   Malignant neoplasm of upper-outer quadrant of right breast in female, estrogen receptor positive (HCC)   Abnormal LFTs   History of liver biopsy   Abdominal pain, right upper quadrant   Bilious vomiting with nausea   Dilated bile duct   Gastritis without bleeding  Nausea vomiting possibly due to choledocholithiasis: CT scan showed [place] and intrahepatic biliary dilation no stones appreciated. Rec upper quadrant ultrasound showed dilated common duct. GI was consulted to perform an ERCP with enterotomy swept sludge with hemobilia.  Stent was placed in the common bile duct. She was able to  tolerate her diet. GI recommended ERCP in 2 months for stent removal.  Transaminitis/history of liver biopsy on 05/24/2024: LFTs and T. bili are down. Follow-up liver biopsy as an outpatient acute hepatitis panel is negative.  Malignant neoplasm of the right upper quadrant of the right breast hormone receptor positive: HER2 negative : Follow-up with oncology as an outpatient.  History of DVT: On no anticoagulation.  Essential hypertension resume home meds.  Hyperlipidemia with diabetes mellitus: Resume statins.  Diabetes mellitus type 2: No changes made to her medication continue current regimen.  Discharge Instructions  Discharge Instructions     Diet - low sodium heart healthy   Complete by: As directed    Increase activity slowly   Complete by: As directed       Allergies as of 05/18/2024       Reactions   Codeine  Hives   Hycodan syrup   Fluorescein  Nausea And Vomiting   ? IV dye for retina specialist   Oxycodone  Nausea And Vomiting   Patient vomited for 3 days after taking   Lipitor [atorvastatin  Calcium ] Other (See Comments)   Leg cramp   Sitagliptin Phosphate Nausea And Vomiting   Sulfa Drugs Cross Reactors Nausea And Vomiting        Medication List     TAKE these medications    aspirin  EC 81 MG tablet Take 81 mg by mouth daily at 6 PM. 1700   brimonidine  0.2 % ophthalmic solution Commonly known as: ALPHAGAN  INSTILL 1 DROP INTO EACH EYE THREE TIMES DAILY   cholecalciferol 1000 units tablet Commonly known as: VITAMIN D Take 1,000 Units by mouth daily.   dorzolamide -timolol  2-0.5 % ophthalmic solution Commonly known as: COSOPT  Place 1 drop into both eyes 2 (two) times daily.  gabapentin  100 MG capsule Commonly known as: NEURONTIN  Take 200 mg at bedtime, can increase to 300 mg at bedtime if tolerated   Ibrance  75 MG tablet Generic drug: palbociclib  Take 1 tablet (75 mg total) by mouth daily. Take for 21 days on, 7 days off, repeat every  28 days.   ketorolac  0.5 % ophthalmic solution Commonly known as: ACULAR  Place 1 drop into the right eye every morning.   lisinopril  5 MG tablet Commonly known as: ZESTRIL  Take 1 tablet by mouth once daily   lovastatin  20 MG tablet Commonly known as: MEVACOR  TAKE 1 TABLET BY MOUTH AT BEDTIME   ondansetron  4 MG disintegrating tablet Commonly known as: ZOFRAN -ODT Take 1 tablet (4 mg total) by mouth every 8 (eight) hours as needed for nausea or vomiting.   pantoprazole  40 MG tablet Commonly known as: PROTONIX  Take 1 tablet (40 mg total) by mouth daily.   pioglitazone -metformin  15-850 MG tablet Commonly known as: ACTOPLUS MET  Take 1 tablet by mouth daily.   prednisoLONE  acetate 1 % ophthalmic suspension Commonly known as: PRED FORTE  Place 1 drop into both eyes as directed. INSTILL ONE DROP INTO BOTH EYES AS DIRECTED TWICE A DAY INTO RIGHT EYE FOUR TIMES A DAY IN LEFT EYE   Rocklatan  0.02-0.005 % Soln Generic drug: Netarsudil -Latanoprost  Place 1 drop into both eyes at bedtime.   traMADol  50 MG tablet Commonly known as: Ultram  Take 1 tablet (50 mg total) by mouth every 6 (six) hours as needed for up to 5 days.        Allergies  Allergen Reactions   Codeine  Hives    Hycodan syrup   Fluorescein  Nausea And Vomiting    ? IV dye for retina specialist   Oxycodone  Nausea And Vomiting    Patient vomited for 3 days after taking   Lipitor [Atorvastatin  Calcium ] Other (See Comments)    Leg cramp   Sitagliptin Phosphate Nausea And Vomiting   Sulfa Drugs Cross Reactors Nausea And Vomiting    Consultations: Gastroenterology   Procedures/Studies: VAS US  LOWER EXTREMITY VENOUS (DVT) Result Date: 05/16/2024  Lower Venous DVT Study Patient Name:  [name]  Date of Exam:   05/16/2024 Medical Rec #: [id number]      Accession #:    [id number] Date of Birth: [birth year]       Patient Gender: F Patient Age:   74 years Exam Location:  [facility] Procedure:      VAS US  LOWER  EXTREMITY VENOUS (DVT) Referring Phys: [name] --------------------------------------------------------------------------------  Indications: Swelling, and Chronic swelling in both leg. Right worse than left.  Comparison Study: No priors. Performing Technologist: Ricka Sturdivant-Jones RDMS, RVT  Examination Guidelines: A complete evaluation includes B-mode imaging, spectral Doppler, color Doppler, and power Doppler as needed of all accessible portions of each vessel. Bilateral testing is considered an integral part of a complete examination. Limited examinations for reoccurring indications may be performed as noted. The reflux portion of the exam is performed with the patient in reverse Trendelenburg.  +---------+---------------+---------+-----------+----------+--------------+ RIGHT    CompressibilityPhasicitySpontaneityPropertiesThrombus Aging +---------+---------------+---------+-----------+----------+--------------+ CFV      Full           Yes      Yes                                 +---------+---------------+---------+-----------+----------+--------------+ SFJ      Full                                                        +---------+---------------+---------+-----------+----------+--------------+  FV Prox  Full                                                        +---------+---------------+---------+-----------+----------+--------------+ FV Mid   Full           Yes      Yes                                 +---------+---------------+---------+-----------+----------+--------------+ FV DistalFull                                                        +---------+---------------+---------+-----------+----------+--------------+ PFV      Full                                                        +---------+---------------+---------+-----------+----------+--------------+ POP      Full           Yes      Yes                                  +---------+---------------+---------+-----------+----------+--------------+ PTV      Full                                                        +---------+---------------+---------+-----------+----------+--------------+ PERO     Full                                                        +---------+---------------+---------+-----------+----------+--------------+   +---------+---------------+---------+-----------+----------+--------------+ LEFT     CompressibilityPhasicitySpontaneityPropertiesThrombus Aging +---------+---------------+---------+-----------+----------+--------------+ CFV      Full           Yes      Yes                                 +---------+---------------+---------+-----------+----------+--------------+ SFJ      Full                                                        +---------+---------------+---------+-----------+----------+--------------+ FV Prox  Full                                                        +---------+---------------+---------+-----------+----------+--------------+  FV Mid   Full           Yes      Yes                                 +---------+---------------+---------+-----------+----------+--------------+ FV DistalFull                                                        +---------+---------------+---------+-----------+----------+--------------+ PFV      Full                                                        +---------+---------------+---------+-----------+----------+--------------+ POP      Full           Yes      Yes                                 +---------+---------------+---------+-----------+----------+--------------+ PTV      Full                                                        +---------+---------------+---------+-----------+----------+--------------+ PERO     Full                                                         +---------+---------------+---------+-----------+----------+--------------+     Summary: BILATERAL: - No evidence of deep vein thrombosis seen in the lower extremities, bilaterally. -No evidence of popliteal cyst, bilaterally.   *See table(s) above for measurements and observations. Electronically signed by Debby Robertson on 05/16/2024 at 2:17:49 PM.    Final    US  Abdomen Limited RUQ (LIVER/GB) Result Date: 05/15/2024 CLINICAL DATA:  Right upper quadrant EXAM: ULTRASOUND ABDOMEN LIMITED RIGHT UPPER QUADRANT COMPARISON:  CT abdomen 05/15/2024 FINDINGS: Gallbladder: Surgically absent. Common bile duct: Diameter: Common bile duct is dilated measuring 2.1 cm. No definitive common bile duct stone seen, although evaluation of the distal common bile duct is limited secondary to overlying bowel gas. Liver: No focal lesion identified. There is increase in parenchymal echogenicity. Portal vein is patent on color Doppler imaging with normal direction of blood flow towards the liver. Other: None. IMPRESSION: 1. Dilated common bile duct measuring up to 2.1 cm. No definitive common bile duct stone seen, although evaluation of the distal common bile duct is limited secondary to overlying bowel gas. Consider further evaluation with MRCP. 2. Hepatic steatosis. Electronically Signed   By: Greig Pique M.D.   On: 05/15/2024 20:29   CT ABDOMEN W WO CONTRAST Result Date: 05/15/2024 CLINICAL DATA:  Liver biopsy yesterday with new onset pain and vomiting. EXAM: CT ABDOMEN WITHOUT AND WITH CONTRAST TECHNIQUE: Multidetector CT imaging of the abdomen was performed following the standard  protocol before and following the bolus administration of intravenous contrast. RADIATION DOSE REDUCTION: This exam was performed according to the departmental dose-optimization program which includes automated exposure control, adjustment of the mA and/or kV according to patient size and/or use of iterative reconstruction technique. CONTRAST:   ISOVUE -300 IOPAMIDOL  (ISOVUE -300) INJECTION 61% COMPARISON:  CT biopsy images obtained yesterday 05/14/2024; recent prior CT scan of the chest, abdomen and pelvis 03/11/2024. FINDINGS: Lower chest: No acute abnormality. Hepatobiliary: Hypoechoic solid lesion in the subcapsular and anterior aspect of segment 5 is essentially unchanged at 1.8 x 1.6 cm. No evidence of hemorrhage. Surgical changes of prior cholecystectomy with significant intra and extrahepatic biliary ductal dilatation which appears unchanged. High attenuation stone in the distal common bile duct consistent with choledocholithiasis. Pancreas: Unremarkable. No pancreatic ductal dilatation or surrounding inflammatory changes. Spleen: Normal in size without focal abnormality. Adrenals/Urinary Tract: Normal left adrenal gland. Stable 1.2 cm left adrenal nodule. Complex cyst in the upper pole of the right kidney measures 3.2 cm and demonstrates internal Hounsfield units of approximately 40. This is similar to the unenhanced CT images from 03/11/2024 consistent with a hemorrhagic or proteinaceous cyst rather than an enhancing lesion. No definite enhancing lesion identified. Stomach/Bowel: No focal bowel wall thickening or evidence of obstruction. Vascular/Lymphatic: Scattered calcified atherosclerotic plaque. No evidence of aneurysm. No suspicious lymphadenopathy. Other: Surgical changes in the right breast. Small fat containing ventral hernia. No evidence of ascites. Musculoskeletal: No acute fracture or aggressive appearing lytic or blastic osseous lesion. IMPRESSION: 1. No evidence of hemorrhage or other complication related to the recent liver biopsy. 2. Similar appearance of severe intra and extrahepatic biliary ductal dilatation with associated choledocholithiasis. Recommend referral to gastroenterology if not previously obtained. 3. Additional ancillary findings as above without interval change. Electronically Signed   By: Wilkie Lent M.D.   On:  05/15/2024 16:04   CT LIVER MASS BIOPSY Result Date: 05/14/2024 INDICATION: 78 year old female with enlarging liver mass concerning for metastasis. EXAM: CT BIOPSY COMPARISON:  03/11/2024 MEDICATIONS: None. ANESTHESIA/SEDATION: Fentanyl  100 mcg IV; Versed  1.5 mg IV Sedation time: 10 minutes; The patient was continuously monitored during the procedure by the interventional radiology nurse under my direct supervision. CONTRAST:  None. COMPLICATIONS: None immediate. PROCEDURE: RADIATION DOSE REDUCTION: This exam was performed according to the departmental dose-optimization program which includes automated exposure control, adjustment of the mA and/or kV according to patient size and/or use of iterative reconstruction technique. Informed consent was obtained from the patient following an explanation of the procedure, risks, benefits and alternatives. A time out was performed prior to the initiation of the procedure. The patient was positioned supine on the CT table and a limited CT was performed for procedural planning demonstrating hypoattenuating round mass in the anterior right dome of the liver. The procedure was planned. The operative site was prepped and draped in the usual sterile fashion. Appropriate trajectory was confirmed with a 22 gauge spinal needle after the adjacent tissues were anesthetized with 1% Lidocaine  with epinephrine . Under intermittent CT guidance, a 17 gauge coaxial needle was advanced into the peripheral aspect of the mass. Appropriate positioning was confirmed and a total of 2 samples were obtained with an 18 gauge core needle biopsy device. The co-axial needle was removed and hemostasis was achieved with manual compression. A limited postprocedural CT was negative for hemorrhage or additional complication. A dressing was placed. The patient tolerated the procedure well without immediate postprocedural complication. IMPRESSION: Technically successful CT guided core needle biopsy of right  lobe  liver mass. Ester Sides, [facility] Electronically Signed   By: Ester Sides M.D.   On: 05/14/2024 16:45   (Echo, Carotid, EGD, Colonoscopy, ERCP)    Subjective: No complaints  Discharge Exam: Vitals:   05/18/24 0518 05/18/24 0618  BP: (!) 190/76 (!) 151/66  Pulse: 86 88  Resp: 16   Temp: 98.5 F (36.9 C)   SpO2: 97%    Vitals:   05/17/24 1552 05/17/24 2059 05/18/24 0518 05/18/24 0618  BP: (!) 192/75 (!) 144/74 (!) 190/76 (!) 151/66  Pulse: 93 96 86 88  Resp: 20 17 16    Temp: 97.9 F (36.6 C) 98.2 F (36.8 C) 98.5 F (36.9 C)   TempSrc: Oral  Oral   SpO2: 97% 92% 97%   Weight:      Height:        General: Pt is alert, awake, not in acute distress Cardiovascular: RRR, S1/S2 +, no rubs, no gallops Respiratory: CTA bilaterally, no wheezing, no rhonchi Abdominal: Soft, NT, ND, bowel sounds + Extremities: no edema, no cyanosis    The results of significant diagnostics from this hospitalization (including imaging, microbiology, ancillary and laboratory) are listed below for reference.     Microbiology: No results found for this or any previous visit (from the past 240 hours).   Labs: BNP (last 3 results) No results for input(s): BNP in the last 8760 hours. Basic Metabolic Panel: Recent Labs  Lab 05/15/24 1853 05/16/24 2114 05/17/24 0458 05/18/24 0528  NA 138 140 139 138  K 3.8 3.5 3.5 3.7  CL 100 105 105 104  CO2 24 24 23 24   GLUCOSE 279* 166* 152* 223*  BUN 9 9 9 12   CREATININE 0.77 0.61 0.54 0.66  CALCIUM  9.4 8.4* 8.7* 8.7*   Liver Function Tests: Recent Labs  Lab 05/15/24 1853 05/16/24 2114 05/17/24 0458 05/18/24 0528  AST 913* 555* 438* 194*  ALT 969* 906* 871* 636*  ALKPHOS 127* 120 126 126  BILITOT 5.7* 5.1* 5.4* 2.0*  PROT 7.6 6.0* 5.9* 5.7*  ALBUMIN  4.6 3.7 3.8 3.6   Recent Labs  Lab 05/15/24 1853  LIPASE 153*   No results for input(s): AMMONIA in the last  168 hours. CBC: Recent Labs  Lab 05/14/24 1115 05/15/24 1853 05/17/24 0458 05/17/24 0800 05/18/24 0528  WBC 2.3* 4.2 2.4*  --  2.7*  NEUTROABS  --   --   --  1.7 1.9  HGB 11.9* 13.8 11.3*  --  11.0*  HCT 35.3* 39.4 34.7*  --  33.3*  MCV 96.4 93.6 100.0  --  97.9  PLT 183 184 116*  --  121*   Cardiac Enzymes: No results for input(s): CKTOTAL, CKMB, CKMBINDEX, TROPONINI in the last 168 hours. BNP: Invalid input(s): POCBNP CBG: Recent Labs  Lab 05/17/24 1134 05/17/24 1325 05/17/24 1808 05/17/24 2057 05/18/24 0730  GLUCAP 121* 114* 162* 217* 207*   D-Dimer No results for input(s): DDIMER in the last 72 hours. Hgb A1c No results for input(s): HGBA1C in the last 72 hours. Lipid Profile No results for input(s): CHOL, HDL, LDLCALC, TRIG, CHOLHDL, LDLDIRECT in the last 72 hours. Thyroid  function studies No results for input(s): TSH, T4TOTAL, T3FREE, THYROIDAB in the last 72 hours.  Invalid input(s): FREET3 Anemia work up No results for input(s): VITAMINB12, FOLATE, FERRITIN, TIBC, IRON, RETICCTPCT in the last 72 hours. Urinalysis    Component Value Date/Time   COLORURINE AMBER (A) 05/15/2024 2134   APPEARANCEUR CLEAR 05/15/2024  2134   LABSPEC >1.046 (H) 05/15/2024 2134   PHURINE 5.0 05/15/2024 2134   GLUCOSEU >=500 (A) 05/15/2024 2134   GLUCOSEU NEGATIVE 11/25/2022 1005   HGBUR NEGATIVE 05/15/2024 2134   BILIRUBINUR NEGATIVE 05/15/2024 2134   BILIRUBINUR neg 04/29/2024 1441   KETONESUR 20 (A) 05/15/2024 2134   PROTEINUR 100 (A) 05/15/2024 2134   UROBILINOGEN negative (A) 04/29/2024 1441   UROBILINOGEN 0.2 11/25/2022 1005   NITRITE NEGATIVE 05/15/2024 2134   LEUKOCYTESUR NEGATIVE 05/15/2024 2134   Sepsis Labs Recent Labs  Lab 05/14/24 1115 05/15/24 1853 05/17/24 0458 05/18/24 0528  WBC 2.3* 4.2 2.4* 2.7*   Microbiology No results found for this or any previous visit (from the past 240 hours).   Time  coordinating discharge: Over 35 minutes  SIGNED:   Erle Odell Castor, [facility]  [facility] 05/18/2024, 10:41 AM Pager   If 7PM-7AM, please contact night-coverage [email or web address] Password [email or web address]

## 2024-05-18 NOTE — Progress Notes (Signed)
 Discharge med in a secure bag delivered to room by this RN. PIV removed as noted. Patient dressing for home. Pt's husband in room- no other questions at this time. Pt to lobby via w/c home w/ spouse

## 2024-05-18 NOTE — Progress Notes (Signed)
 Gastroenterology Inpatient Follow-up Note   PATIENT IDENTIFICATION  [name] is a 78 y.o. female Hospital Day: 4  SUBJECTIVE  The patient's chart has been reviewed. The patient's labs have been reviewed.  Patient is biochemical testing is decreasing today. Today, the patient is seen while she is sitting up at her bed having breakfast with her husband with her as well. She continues to experience right upper quadrant abdominal discomfort but it is significantly improved since admission. The patient denies fevers or chills. She has not noted a change in her urine color as of yet.   OBJECTIVE   Scheduled Inpatient Medications:   brimonidine   1 drop Both Eyes TID   dorzolamide -timolol   1 drop Both Eyes BID   enoxaparin  (LOVENOX ) injection  40 mg Subcutaneous Q24H   gabapentin   200 mg Oral QHS   insulin  aspart  0-9 Units Subcutaneous TID WC   ketorolac   1 drop Right Eye q morning   lisinopril   5 mg Oral Daily   Netarsudil -Latanoprost   1 drop Ophthalmic QHS   pantoprazole   40 mg Oral Daily   prednisoLONE  acetate  2 drop Both Eyes QID   Continuous Inpatient Infusions:   piperacillin -tazobactam (ZOSYN )  IV 3.375 g (05/18/24 0553)   PRN Inpatient Medications: hydrALAZINE , HYDROmorphone  (DILAUDID ) injection, labetalol , ondansetron  (ZOFRAN ) IV   Physical Examination   Temp:  [97.9 F (36.6 C)-98.7 F (37.1 C)] 98.5 F (36.9 C) (12/06 0518) Pulse Rate:  [82-97] 88 (12/06 0618) Resp:  [10-20] 16 (12/06 0518) BP: (144-217)/(66-113) 151/66 (12/06 0618) SpO2:  [92 %-97 %] 97 % (12/06 0518) Weight:  [104 kg] 104 kg (12/05 1324) Temp (24hrs), Avg:98.3 F (36.8 C), Min:97.9 F (36.6 C), Max:98.7 F (37.1 C)  Weight: 104 kg GEN: NAD, appears stated age, doesn't appear chronically ill PSYCH: Cooperative, without pressured speech EYE: Conjunctivae pink, sclerae anicteric ENT: MMM CV: Nontachycardic RESP: No audible wheezing GI: NABS, soft, TTP in right upper quadrant and  midepigastrium (3 out of 10) upon deep palpation, volitional guarding present, no rebound  MSK/EXT: No significant lower extremity edema SKIN: No jaundice NEURO:  Alert & Oriented x 3, no focal deficits   Review of Data   Laboratory Studies   Recent Labs  Lab 05/18/24 0528  NA 138  K 3.7  CL 104  CO2 24  BUN 12  CREATININE 0.66  GLUCOSE 223*  CALCIUM  8.7*   Recent Labs  Lab 05/18/24 0528  AST 194*  ALT 636*  ALKPHOS 126    Recent Labs  Lab 05/15/24 1853 05/17/24 0458 05/18/24 0528  WBC 4.2 2.4* 2.7*  HGB 13.8 11.3* 11.0*  HCT 39.4 34.7* 33.3*  PLT 184 116* 121*   Recent Labs  Lab 05/14/24 1115 05/17/24 0458  INR 1.1 1.2    Imaging Studies  No results found.   GI Procedures and Studies  ERCP - Z-line irregular. - Erythematous mucosa in the stomach. Biopsied. - J-shaped gastric deformity. - No gross lesions in the duodenal bulb, in the first portion of the duodenum and in the second portion of the duodenum. - Small major papilla. - The fluoroscopic examination was suspicious for sludge. - The entire main bile duct was markedly dilated. - A biliary sphincterotomy was performed. - The biliary tree was swept and sludge was found (appearance of hemobilia and old blood products more than stone debris). - Flow of contrast/bile was slow. - One plastic biliary stent was placed into the common bile duct to ensure adequate drainage.  ASSESSMENT  [name] is a 78 y.o. female with a pmh significant for hypertension, hyperlipidemia, prior uterine cancer post hysterectomy, metastatic breast cancer, esophageal stenosis.  Patient admitted to hospital with abnormal LFTs and abdominal pain post liver biopsy found to have choledocholithiasis/biliary sludge on ERCP's post sphincterotomy with biliary stenting.  The patient is clinically and hemodynamically stable at this time.  No evidence of post ERCP pancreatitis at this point.  Biliary flow was slow at time of completion of  ERCP so biliary stent placed to ensure adequate drainage.  She seems to be doing well.  LFT pattern is decreasing as would be expected.  It may still take some time for the LFTs to normalize in the setting of what may have been some mild hepatitis after her biopsy.  She will need a repeat ERCP in 2 to 3 months for stent removal and final cleanout which we will arrange.  She will need a repeat hepatic function panel within a week of discharge, we will work on arranging this as well.  From a GI standpoint she can be discharged when she is medically ready.  All patient questions were answered to the best of my ability, and the patient agrees to the aforementioned plan of action with follow-up as indicated.   PLAN/RECOMMENDATIONS  Trend LFTs while inpatient We will schedule a hepatic function panel to be drawn within a week in the [place] office Repeat ERCP in 2 to 3 months for stent removal and final cleanout will be arranged   Inpatient GI will sign off at this time, but please page/call with questions or concerns.   Shannon Finner, MD [facility] Office # [phone]    LOS: 2 days  [facility]  05/18/2024, 10:11 AM

## 2024-05-19 ENCOUNTER — Ambulatory Visit: Payer: Self-pay | Admitting: Gastroenterology

## 2024-05-20 ENCOUNTER — Telehealth: Payer: Self-pay

## 2024-05-20 ENCOUNTER — Encounter (HOSPITAL_COMMUNITY): Payer: Self-pay | Admitting: Gastroenterology

## 2024-05-20 DIAGNOSIS — K8040 Calculus of bile duct with cholecystitis, unspecified, without obstruction: Secondary | ICD-10-CM

## 2024-05-20 NOTE — Telephone Encounter (Signed)
 Lab order has been entered   Needs ERCP Jan

## 2024-05-20 NOTE — Telephone Encounter (Signed)
-----   Message from [facility] sent at 05/18/2024  6:21 AM EST ----- Regarding: Follow-up [name], This patient needs follow-up ERCP in 2 months for biliary stent removal. HFP follow-up within the next 1 week in the outpatient lab (you can put her under my name). Thanks. GM ----- Message ----- From: Wilhelmenia Aloha Raddle., MD Sent: 05/18/2024   6:22 AM EST To: Odetta LITTIE Curly, RN Subject: Follow-up                                      [name], This patient needs follow-up ERCP in 2 months for biliary stent removal. HFP follow-up in 2 weeks in the lab. Thanks. GM

## 2024-05-21 LAB — SURGICAL PATHOLOGY

## 2024-05-22 ENCOUNTER — Other Ambulatory Visit: Payer: Self-pay

## 2024-05-22 DIAGNOSIS — Z4689 Encounter for fitting and adjustment of other specified devices: Secondary | ICD-10-CM

## 2024-05-22 DIAGNOSIS — K8040 Calculus of bile duct with cholecystitis, unspecified, without obstruction: Secondary | ICD-10-CM

## 2024-05-22 NOTE — Telephone Encounter (Signed)
 ERCP scheduled, pt instructed and medications reviewed.  Patient instructions mailed to home.  Patient to call with any questions or concerns.   The pt will have labs next week

## 2024-05-22 NOTE — Telephone Encounter (Signed)
 ERCP has been set up for 07/21/24 at 8 am at [facility] with GM

## 2024-05-22 NOTE — Progress Notes (Signed)
 Email sent to [facility] dept requesting breast prognostic panel on [phone]. Awaiting response.

## 2024-05-23 ENCOUNTER — Inpatient Hospital Stay

## 2024-05-23 ENCOUNTER — Inpatient Hospital Stay: Attending: Hematology and Oncology

## 2024-05-23 ENCOUNTER — Inpatient Hospital Stay: Admitting: Hematology and Oncology

## 2024-05-23 VITALS — BP 152/78 | HR 89 | Temp 97.8°F | Resp 17 | Wt 223.4 lb

## 2024-05-23 DIAGNOSIS — C7800 Secondary malignant neoplasm of unspecified lung: Secondary | ICD-10-CM

## 2024-05-23 DIAGNOSIS — C50411 Malignant neoplasm of upper-outer quadrant of right female breast: Secondary | ICD-10-CM | POA: Diagnosis not present

## 2024-05-23 DIAGNOSIS — K449 Diaphragmatic hernia without obstruction or gangrene: Secondary | ICD-10-CM | POA: Diagnosis not present

## 2024-05-23 DIAGNOSIS — Z5111 Encounter for antineoplastic chemotherapy: Secondary | ICD-10-CM | POA: Diagnosis present

## 2024-05-23 DIAGNOSIS — R32 Unspecified urinary incontinence: Secondary | ICD-10-CM | POA: Diagnosis not present

## 2024-05-23 DIAGNOSIS — I10 Essential (primary) hypertension: Secondary | ICD-10-CM | POA: Insufficient documentation

## 2024-05-23 DIAGNOSIS — Z803 Family history of malignant neoplasm of breast: Secondary | ICD-10-CM | POA: Diagnosis not present

## 2024-05-23 DIAGNOSIS — Z170 Estrogen receptor positive status [ER+]: Secondary | ICD-10-CM | POA: Diagnosis not present

## 2024-05-23 DIAGNOSIS — Z1732 Human epidermal growth factor receptor 2 negative status: Secondary | ICD-10-CM | POA: Diagnosis not present

## 2024-05-23 DIAGNOSIS — E041 Nontoxic single thyroid nodule: Secondary | ICD-10-CM | POA: Insufficient documentation

## 2024-05-23 DIAGNOSIS — Z1721 Progesterone receptor positive status: Secondary | ICD-10-CM | POA: Insufficient documentation

## 2024-05-23 DIAGNOSIS — Z90710 Acquired absence of both cervix and uterus: Secondary | ICD-10-CM | POA: Insufficient documentation

## 2024-05-23 DIAGNOSIS — Z801 Family history of malignant neoplasm of trachea, bronchus and lung: Secondary | ICD-10-CM | POA: Diagnosis not present

## 2024-05-23 DIAGNOSIS — Z7409 Other reduced mobility: Secondary | ICD-10-CM | POA: Insufficient documentation

## 2024-05-23 DIAGNOSIS — J984 Other disorders of lung: Secondary | ICD-10-CM | POA: Insufficient documentation

## 2024-05-23 DIAGNOSIS — C787 Secondary malignant neoplasm of liver and intrahepatic bile duct: Secondary | ICD-10-CM | POA: Diagnosis not present

## 2024-05-23 DIAGNOSIS — K831 Obstruction of bile duct: Secondary | ICD-10-CM | POA: Diagnosis not present

## 2024-05-23 DIAGNOSIS — E669 Obesity, unspecified: Secondary | ICD-10-CM | POA: Diagnosis not present

## 2024-05-23 DIAGNOSIS — Z923 Personal history of irradiation: Secondary | ICD-10-CM | POA: Insufficient documentation

## 2024-05-23 LAB — CMP (CANCER CENTER ONLY)
ALT: 155 U/L — ABNORMAL HIGH (ref 0–44)
AST: 31 U/L (ref 15–41)
Albumin: 4.2 g/dL (ref 3.5–5.0)
Alkaline Phosphatase: 89 U/L (ref 38–126)
Anion gap: 11 (ref 5–15)
BUN: 6 mg/dL — ABNORMAL LOW (ref 8–23)
CO2: 24 mmol/L (ref 22–32)
Calcium: 9.2 mg/dL (ref 8.9–10.3)
Chloride: 103 mmol/L (ref 98–111)
Creatinine: 0.63 mg/dL (ref 0.44–1.00)
GFR, Estimated: >60 mL/min (ref >60)
Glucose, Bld: 223 mg/dL — ABNORMAL HIGH (ref 70–99)
Potassium: 4.2 mmol/L (ref 3.5–5.1)
Sodium: 138 mmol/L (ref 135–145)
Total Bilirubin: 1 mg/dL (ref 0.0–1.2)
Total Protein: 6.8 g/dL (ref 6.5–8.1)

## 2024-05-23 LAB — CBC WITH DIFFERENTIAL/PLATELET
Abs Immature Granulocytes: 0.02 K/uL (ref 0.00–0.07)
Basophils Absolute: 0 K/uL (ref 0.0–0.1)
Basophils Relative: 1 %
Eosinophils Absolute: 0 K/uL (ref 0.0–0.5)
Eosinophils Relative: 2 %
HCT: 36.6 % (ref 36.0–46.0)
Hemoglobin: 12.6 g/dL (ref 12.0–15.0)
Immature Granulocytes: 1 %
Lymphocytes Relative: 29 %
Lymphs Abs: 0.6 K/uL — ABNORMAL LOW (ref 0.7–4.0)
MCH: 32.1 pg (ref 26.0–34.0)
MCHC: 34.4 g/dL (ref 30.0–36.0)
MCV: 93.4 fL (ref 80.0–100.0)
Monocytes Absolute: 0.4 K/uL (ref 0.1–1.0)
Monocytes Relative: 20 %
Neutro Abs: 1 K/uL — ABNORMAL LOW (ref 1.7–7.7)
Neutrophils Relative %: 47 %
Platelets: 142 K/uL — ABNORMAL LOW (ref 150–400)
RBC: 3.92 MIL/uL (ref 3.87–5.11)
RDW: 14.5 % (ref 11.5–15.5)
WBC: 2 K/uL — ABNORMAL LOW (ref 4.0–10.5)
nRBC: 0 % (ref 0.0–0.2)

## 2024-05-23 LAB — SURGICAL PATHOLOGY

## 2024-05-23 MED ORDER — FULVESTRANT 250 MG/5ML IM SOSY
500.0000 mg | PREFILLED_SYRINGE | Freq: Once | INTRAMUSCULAR | Status: AC
  Administered 2024-05-23: 500 mg via INTRAMUSCULAR
  Filled 2024-05-23: qty 10

## 2024-05-23 NOTE — Progress Notes (Unsigned)
 [facility]  Telephone:(336) [phone] Fax:(336) [phone]     ID: LOLETHA BERTINI DOB: 1946/03/19  MR#: [id number]  RDW#:[id number]  Patient Care Team: Rollene Almarie LABOR, MD as PCP - General (Internal Medicine) Ethyl Lenis, MD as Consulting Physician (General Surgery) Dewey Rush, MD as Consulting Physician (Radiation Oncology) Sarrah Browning, MD as Consulting Physician (Obstetrics and Gynecology) Marleen Redell SAUNDERS, MD as Referring Physician (Specialist) Shelah Lamar RAMAN, MD as Consulting Physician (Pulmonary Disease) Alvaro Ricardo KATHEE Raddle., MD as Consulting Physician (Urology) Nilsa, Dempsey Pac, MD as Referring Physician (Ophthalmology) Lucila Rush LABOR, RPH-CPP (Pharmacist) Austin Olam CROME, MD as Consulting Physician (Ophthalmology) Loretha Ash, MD as Consulting Physician (Hematology and Oncology) Loretha Ash, MD as Consulting Physician (Hematology and Oncology)  CHIEF COMPLAINT: Estrogen receptor positive breast cancer  CURRENT TREATMENT: Letrozole , palbociclib   INTERVAL HISTORY:  History of Present Illness   [name] is a 78 year old female with metastatic ER+/PR+/HER2- breast cancer with oligometastatic liver involvement who presents for oncology follow-up after recent hospitalization for biliary obstruction and ongoing management of metastatic disease.  Since her recent hospitalization for biliary obstruction and related procedures, she has experienced persistent abdominal pain, which is more severe than prior to admission and constant unless managed with analgesics. She has required three pain pills since discharge and uses acetaminophen  for milder pain. She also reports increased pain at the incision under her breast and pain radiating from the lateral chest. She denies abdominal pain today after taking a pain pill. Overall, her pain is new and worse compared to her last visit.  Following the biopsy, she developed severe abdominal pain and  subsequently underwent ERCP with stent placement. She was told that no cancer was found on ERCP or associated biopsies. She denies new or worsening pancreatic symptoms.  She has been receiving monthly fulvestrant  injections since October, with the first two doses well tolerated. The third injection was associated with significant injection site pain lasting four weeks. She experienced nausea and fatigue after the first two injections, with morning-predominant nausea and some vomiting, partially relieved by ginger. She is no longer taking Ibrance , which she had been on for seven years. She does not recall nausea after the most recent injection and denies diffuse pain. She is currently due for her December injection.  She reports significant fatigue, a sedentary lifestyle, frequent daytime napping, and minimal physical activity. She has difficulty with mobility, lower extremity edema, and exertional dyspnea with activities such as walking from the car to the building. She attributes some inactivity to pain and recent abdominal surgery. She has lost 30 lbs over the past year, from 252 lbs to 222 lbs, with inconsistent dietary and exercise habits. She expresses interest in a lift chair to assist with mobility, particularly due to bladder dysfunction and incontinence when rising from a chair. She has had two urinary tract infections and has an upcoming urology appointment in April.  Rest of the pertinent 10 point ROS reviewed and negative.  We are continuing to follow her tumor marker: Lab Results  Component Value Date   CA2729 53.1 (H) 04/16/2024   CA2729 46.1 (H) 03/21/2024   CA2729 50.7 (H) 01/02/2024   CA2729 48.7 (H) 09/19/2023   CA2729 35.9 06/26/2023    REVIEW OF SYSTEMS:   A detailed review of systems today was otherwise stable.   COVID 19 VACCINATION STATUS: Status post Pfizer x2 followed by booster August 2021; had COVID November 2022   BREAST CANCER HISTORY: From the original intake  note:  Gray had screening mammography showing some suspicious calcifications in the right breast leading to right diagnostic mammography with ultrasonography 01/22/2016 at [facility] The breast density was category C. In the upper right breast there was a 2.3 cm mass with additional masses measuring 0.9 and 0.7 cm. There was also a possible additional 0.8 mass in the lower inner quadrant. Ultrasound confirmed an irregular hypoechoic mass in the right breast upper outer quadrant measuring 2.0 cm. There were other masses measuring 0.7 and 0.8 cm by ultrasonography. The right axilla was sonographically benign.  Biopsy of a 12:00 and 4:00 mass in the right breast 01/22/2016 showed ([id number]) both specimens showing invasive ductal carcinoma, grade 1 or 2, both 95% estrogen receptor positive, both 95% progesterone receptor positive, both with strong staining intensity, with MIB-1 ranging from 10-15%, and both HER-2 negative, the signals ratio being 1.23-1.42, and the number per cell 1.85-2.59.  Her subsequent history is as detailed below   PAST MEDICAL HISTORY: Past Medical History:  Diagnosis Date   Breast cancer (HCC)    Cancer (HCC) 02/2016   right breast   DIABETES MELLITUS, TYPE II 01/04/2007   only takes actoplus daily   Dizziness and giddiness 02/29/2008   DVT, HX OF    at age 53 in right buttocks   Dyspnea    due to lung cancer   Family history of breast cancer    GERD 01/04/2007   pt reports resolved    GLAUCOMA 07/30/2008   both eyes   History of blood transfusion    no abnormal  reaction   History of uterine cancer 2000   hysterectomy done   HYPERLIPIDEMIA 01/04/2007   taking Pravastatin  daily   HYPERTENSION 01/04/2007   takes Lisinopril  daily   Joint pain    Joint swelling    Leg cramps    LEG PAIN, LEFT 07/06/2007   NUMBNESS 07/30/2008   in fingers;pt states from Diamox    OSTEOARTHRITIS, HIP 09/25/2009   OTITIS MEDIA, ACUTE, BILATERAL 02/29/2008   Overweight(278.02)  01/04/2007   Peripheral vascular disease    Personal history of radiation therapy 2018   Pneumonia    PONV (postoperative nausea and vomiting)    SLEEP APNEA, OBSTRUCTIVE    doesn't use a cpap;study done about 66yrs ago   TRANSIENT ISCHEMIC ATTACK, HX OF 01/04/2007   Vision loss    left eye    PAST SURGICAL HISTORY: Past Surgical History:  Procedure Laterality Date   ABDOMINAL HYSTERECTOMY  2000   BILIARY STENT PLACEMENT N/A 05/17/2024   Procedure: INSERTION, STENT, BILE DUCT;  Surgeon: Wilhelmenia Aloha Raddle., MD;  Location: [facility];  Service: Gastroenterology;  Laterality: N/A;   BIOPSY OF SKIN SUBCUTANEOUS TISSUE AND/OR MUCOUS MEMBRANE  05/17/2024   Procedure: BIOPSY, SKIN, SUBCUTANEOUS TISSUE, OR MUCOUS MEMBRANE;  Surgeon: Mansouraty, Aloha Raddle., MD;  Location: [facility];  Service: Gastroenterology;;   BREAST LUMPECTOMY Right 01/13/2017   x2   BREAST LUMPECTOMY WITH RADIOACTIVE SEED AND SENTINEL LYMPH NODE BIOPSY Right 01/13/2017   Procedure: RIGHT BREAST RADIOACTIVE SEED X'S 2 GUIDED LUMPECTOMY WITH RADIOACTIVE SEED TARGETED AXILLARYLYMPH NODE EXCISION AND RIGHT AXILLARY SENTINEL LYMPH NODE BIOPSY;  Surgeon: Ethyl Lenis, MD;  Location: [facility];  Service: General;  Laterality: Right;  2 SEEDS IN RIGHT BREAST 1 SEED IN RIGHT AXILLARY NODE   CHOLECYSTECTOMY     ENDOBRONCHIAL ULTRASOUND Bilateral 03/28/2016   Procedure: ENDOBRONCHIAL ULTRASOUND;  Surgeon: Lamar GORMAN Chris, MD;  Location: [facility];  Service: Cardiopulmonary;  Laterality: Bilateral;  ERCP N/A 05/17/2024   Procedure: ERCP, WITH INTERVENTION IF INDICATED;  Surgeon: Wilhelmenia Aloha Raddle., MD;  Location: [facility];  Service: Gastroenterology;  Laterality: N/A;   EYE SURGERY  13   shunt left and lazer eye surgery on right cataract and retenia tear with repair   growth removal  2004   from thumb   KNEE ARTHROSCOPY Right    mulitple eye surgeries     both eyes, cataracts with ioc done both eyes   OOPHORECTOMY      right lumpectomy with axillary node dissection Right 01/2017   SPHINCTEROTOMY  05/17/2024   Procedure: SPHINCTEROTOMY, BILIARY;  Surgeon: Wilhelmenia Aloha Raddle., MD;  Location: [facility];  Service: Gastroenterology;;   STONE EXTRACTION WITH BASKET  05/17/2024   Procedure: ERCP, WITH LITHROTRIPSY OR REMOVAL OF COMMON BILE DUCT CALCULUS USING BALLOON;  Surgeon: Wilhelmenia Aloha Raddle., MD;  Location: [facility];  Service: Gastroenterology;;   TOTAL HIP ARTHROPLASTY  06/24/2011   Procedure: TOTAL HIP ARTHROPLASTY;  Surgeon: Dempsey JINNY Sensor;  Location: [facility];  Service: Orthopedics;  Laterality: Right;   TOTAL HIP ARTHROPLASTY Left 11/12/2012   Dr Sensor   TOTAL HIP ARTHROPLASTY Left 11/12/2012   Procedure: TOTAL HIP ARTHROPLASTY;  Surgeon: Dempsey JINNY Sensor, MD;  Location: [facility];  Service: Orthopedics;  Laterality: Left;  DEPUY PINNACLE    FAMILY HISTORY Family History  Problem Relation Age of Onset   Breast cancer Mother 73   Dementia Mother    Cancer Mother        Breast and lung cancer   Stroke Sister    Breast cancer Sister 12   Heart attack Maternal Aunt    Lung cancer Maternal Grandmother        non smoker   Glaucoma Maternal Grandfather    Anesthesia problems Neg Hx    Colon cancer Neg Hx    Esophageal cancer Neg Hx    Rectal cancer Neg Hx    Stomach cancer Neg Hx   The patient's father died at age 51, the patient's mother died at age 60. She had breast and lung cancers diagnosed shortly before her death. The patient had no brothers, 2 sisters. One sister was diagnosed with breast cancer at the age of 72.   GYNECOLOGIC HISTORY:  No LMP recorded. Patient has had a hysterectomy. Menarche age 62, first live birth age 84, the patient is GX P1. She had a hysterectomy for endometrial cancer in the year 2000. She did not take hormone replacement. She did use oral contraceptives for more than 20 years remotely, with no complications.   SOCIAL HISTORY: (Updated July 2021). 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 She is home with her husband Debby. He is a retired curator.Their son Carlin also lives in [place].  The patient has 3 grandchildren aged 33, 88 and 82    ADVANCED DIRECTIVES: In place   HEALTH MAINTENANCE: Social History   Tobacco Use   Smoking status: Never   Smokeless tobacco: Never  Vaping Use   Vaping status: Never Used  Substance Use Topics   Alcohol use: No   Drug use: No     Colonoscopy: Never  PAP: Status post hysterectomy  Bone density: Remote   Allergies  Allergen Reactions   Codeine  Hives    Hycodan syrup   Fluorescein  Nausea And Vomiting    ? IV dye for retina specialist   Oxycodone  Nausea And  Vomiting    Patient vomited for 3 days after taking   Lipitor [Atorvastatin  Calcium ] Other (See Comments)    Leg cramp   Sitagliptin Phosphate Nausea And Vomiting   Sulfa Drugs Cross Reactors Nausea And Vomiting    Current Outpatient Medications  Medication Sig Dispense Refill   aspirin  EC 81 MG tablet Take 81 mg by mouth daily at 6 PM. 1700     brimonidine  (ALPHAGAN ) 0.2 % ophthalmic solution INSTILL 1 DROP INTO EACH EYE THREE TIMES DAILY     cholecalciferol (VITAMIN D) 1000 units tablet Take 1,000 Units by mouth daily.     dorzolamide -timolol  (COSOPT ) 22.3-6.8 MG/ML ophthalmic solution Place 1 drop into both eyes 2 (two) times daily.     gabapentin  (NEURONTIN ) 100 MG capsule Take 200 mg at bedtime, can increase to 300 mg at bedtime if tolerated 90 capsule 3   ketorolac  (ACULAR ) 0.5 % ophthalmic solution Place 1 drop into the right eye every morning.     lisinopril  (ZESTRIL ) 5 MG tablet Take 1 tablet by mouth once daily 90 tablet 0   lovastatin  (MEVACOR ) 20 MG tablet TAKE 1 TABLET BY MOUTH AT BEDTIME 90 tablet 3   Netarsudil -Latanoprost  (ROCKLATAN ) 0.02-0.005 % SOLN Place 1 drop into both eyes at bedtime.     ondansetron  (ZOFRAN -ODT) 4 MG disintegrating tablet  Take 1 tablet (4 mg total) by mouth every 8 (eight) hours as needed for nausea or vomiting. 20 tablet 3   palbociclib  (IBRANCE ) 75 MG tablet Take 1 tablet (75 mg total) by mouth daily. Take for 21 days on, 7 days off, repeat every 28 days. 21 tablet 11   pantoprazole  (PROTONIX ) 40 MG tablet Take 1 tablet (40 mg total) by mouth daily. (Patient not taking: Reported on 05/16/2024) 30 tablet 11   pioglitazone -metformin  (ACTOPLUS MET ) 15-850 MG tablet Take 1 tablet by mouth daily. 90 tablet 3   prednisoLONE  acetate (PRED FORTE ) 1 % ophthalmic suspension Place 1 drop into both eyes as directed. INSTILL ONE DROP INTO BOTH EYES AS DIRECTED TWICE A DAY INTO RIGHT EYE FOUR TIMES A DAY IN LEFT EYE     traMADol  (ULTRAM ) 50 MG tablet Take 1 tablet (50 mg total) by mouth every 6 (six) hours as needed for up to 5 days. 20 tablet 0   No current facility-administered medications for this visit.     OBJECTIVE: white woman in no acute distress  Vitals:   05/23/24 1345 05/23/24 1426  BP: (!) 199/97 (!) 152/78  Pulse: 89   Resp: 17   Temp: 97.8 F (36.6 C)   SpO2: 97%      Wt Readings from Last 3 Encounters:  05/23/24 223 lb 6.4 oz (101.3 kg)  05/17/24 229 lb 4.5 oz (104 kg)  05/14/24 229 lb 4.5 oz (104 kg)   Body mass index is 38.35 kg/m.    ECOG FS:1 - Symptomatic but completely ambulatory  She is no acute distress  LAB RESULTS:  CMP     Component Value Date/Time   NA 138 05/23/2024 1435   NA 140 05/29/2017 1254   K 4.2 05/23/2024 1435   K 4.4 05/29/2017 1254   CL 103 05/23/2024 1435   CO2 24 05/23/2024 1435   CO2 25 05/29/2017 1254   GLUCOSE 223 (H) 05/23/2024 1435   GLUCOSE 154 (H) 05/29/2017 1254   BUN 6 (L) 05/23/2024 1435   BUN 11.5 05/29/2017 1254   CREATININE 0.63 05/23/2024 1435   CREATININE 0.8 05/29/2017 1254   CALCIUM  9.2  05/23/2024 1435   CALCIUM  9.3 05/29/2017 1254   PROT 6.8 05/23/2024 1435   PROT 7.0 05/29/2017 1254   ALBUMIN  4.2 05/23/2024 1435   ALBUMIN  4.1  05/29/2017 1254   AST 31 05/23/2024 1435   AST 13 05/29/2017 1254   ALT 155 (H) 05/23/2024 1435   ALT 14 05/29/2017 1254   ALKPHOS 89 05/23/2024 1435   ALKPHOS 66 05/29/2017 1254   BILITOT 1.0 05/23/2024 1435   BILITOT 0.50 05/29/2017 1254   GFRNONAA >60 05/23/2024 1435   GFRAA >60 03/10/2020 1138    INo results found for: SPEP, UPEP  Lab Results  Component Value Date   WBC 2.0 (L) 05/23/2024   NEUTROABS 1.0 (L) 05/23/2024   HGB 12.6 05/23/2024   HCT 36.6 05/23/2024   MCV 93.4 05/23/2024   PLT 142 (L) 05/23/2024      Chemistry      Component Value Date/Time   NA 138 05/23/2024 1435   NA 140 05/29/2017 1254   K 4.2 05/23/2024 1435   K 4.4 05/29/2017 1254   CL 103 05/23/2024 1435   CO2 24 05/23/2024 1435   CO2 25 05/29/2017 1254   BUN 6 (L) 05/23/2024 1435   BUN 11.5 05/29/2017 1254   CREATININE 0.63 05/23/2024 1435   CREATININE 0.8 05/29/2017 1254      Component Value Date/Time   CALCIUM  9.2 05/23/2024 1435   CALCIUM  9.3 05/29/2017 1254   ALKPHOS 89 05/23/2024 1435   ALKPHOS 66 05/29/2017 1254   AST 31 05/23/2024 1435   AST 13 05/29/2017 1254   ALT 155 (H) 05/23/2024 1435   ALT 14 05/29/2017 1254   BILITOT 1.0 05/23/2024 1435   BILITOT 0.50 05/29/2017 1254       No results found for: LABCA2  No components found for: LABCA125  Recent Labs  Lab 05/17/24 0458  INR 1.2    Urinalysis    Component Value Date/Time   COLORURINE AMBER (A) 05/15/2024 2134   APPEARANCEUR CLEAR 05/15/2024 2134   LABSPEC >1.046 (H) 05/15/2024 2134   PHURINE 5.0 05/15/2024 2134   GLUCOSEU >=500 (A) 05/15/2024 2134   GLUCOSEU NEGATIVE 11/25/2022 1005   HGBUR NEGATIVE 05/15/2024 2134   BILIRUBINUR NEGATIVE 05/15/2024 2134   BILIRUBINUR neg 04/29/2024 1441   KETONESUR 20 (A) 05/15/2024 2134   PROTEINUR 100 (A) 05/15/2024 2134   UROBILINOGEN negative (A) 04/29/2024 1441   UROBILINOGEN 0.2 11/25/2022 1005   NITRITE NEGATIVE 05/15/2024 2134   LEUKOCYTESUR NEGATIVE  05/15/2024 2134    STUDIES: DG ERCP Result Date: 05/19/2024 CLINICAL DATA:  [id number] Surgery, elective [id number] EXAM: ERCP COMPARISON:  US  Abdomen, 05/15/2024. CT AP, 05/15/2024. FLUOROSCOPY: Exposure Index (as provided by the fluoroscopic device): 34.3 mGy Kerma FINDINGS: Limited oblique planar images of the RIGHT upper quadrant obtained C-arm. Images demonstrating flexible endoscopy, biliary duct cannulation, sphincterotomy, retrograde cholangiogram and balloon sweep. Moderate extrahepatic biliary ductal dilation. No evidence of biliary filling defect is demonstrated. Plastic biliary stent placement. IMPRESSION: Fluoroscopic imaging for ERCP and plastic biliary stent placement For complete description of intra procedural findings, please see performing service dictation. Electronically Signed   By: Thom Hall M.D.   On: 05/19/2024 09:21   VAS US  LOWER EXTREMITY VENOUS (DVT) Result Date: 05/16/2024  Lower Venous DVT Study Patient Name:  DEKAYLA PRESTRIDGE  Date of Exam:   05/16/2024 Medical Rec #: [id number]      Accession #:    [id number] Date of Birth: 04/22/1946       Patient Gender: F  Patient Age:   78 years Exam [facility]:  [facility] Procedure:      VAS US  LOWER EXTREMITY VENOUS (DVT) Referring Phys: ELSPETH NEWTON --------------------------------------------------------------------------------  Indications: Swelling, and Chronic swelling in both leg. Right worse than left.  Comparison Study: No priors. Performing Technologist: Ricka Sturdivant-Jones RDMS, RVT  Examination Guidelines: A complete evaluation includes B-mode imaging, spectral Doppler, color Doppler, and power Doppler as needed of all accessible portions of each vessel. Bilateral testing is considered an integral part of a complete examination. Limited examinations for reoccurring indications may be performed as noted. The reflux portion of the exam is performed with the patient in reverse Trendelenburg.   +---------+---------------+---------+-----------+----------+--------------+ RIGHT    CompressibilityPhasicitySpontaneityPropertiesThrombus Aging +---------+---------------+---------+-----------+----------+--------------+ CFV      Full           Yes      Yes                                 +---------+---------------+---------+-----------+----------+--------------+ SFJ      Full                                                        +---------+---------------+---------+-----------+----------+--------------+ FV Prox  Full                                                        +---------+---------------+---------+-----------+----------+--------------+ FV Mid   Full           Yes      Yes                                 +---------+---------------+---------+-----------+----------+--------------+ FV DistalFull                                                        +---------+---------------+---------+-----------+----------+--------------+ PFV      Full                                                        +---------+---------------+---------+-----------+----------+--------------+ POP      Full           Yes      Yes                                 +---------+---------------+---------+-----------+----------+--------------+ PTV      Full                                                        +---------+---------------+---------+-----------+----------+--------------+ PERO  Full                                                        +---------+---------------+---------+-----------+----------+--------------+   +---------+---------------+---------+-----------+----------+--------------+ LEFT     CompressibilityPhasicitySpontaneityPropertiesThrombus Aging +---------+---------------+---------+-----------+----------+--------------+ CFV      Full           Yes      Yes                                  +---------+---------------+---------+-----------+----------+--------------+ SFJ      Full                                                        +---------+---------------+---------+-----------+----------+--------------+ FV Prox  Full                                                        +---------+---------------+---------+-----------+----------+--------------+ FV Mid   Full           Yes      Yes                                 +---------+---------------+---------+-----------+----------+--------------+ FV DistalFull                                                        +---------+---------------+---------+-----------+----------+--------------+ PFV      Full                                                        +---------+---------------+---------+-----------+----------+--------------+ POP      Full           Yes      Yes                                 +---------+---------------+---------+-----------+----------+--------------+ PTV      Full                                                        +---------+---------------+---------+-----------+----------+--------------+ PERO     Full                                                        +---------+---------------+---------+-----------+----------+--------------+  Summary: BILATERAL: - No evidence of deep vein thrombosis seen in the lower extremities, bilaterally. -No evidence of popliteal cyst, bilaterally.   *See table(s) above for measurements and observations. Electronically signed by Debby Robertson on 05/16/2024 at 2:17:49 PM.    Final    US  Abdomen Limited RUQ (LIVER/GB) Result Date: 05/15/2024 CLINICAL DATA:  Right upper quadrant EXAM: ULTRASOUND ABDOMEN LIMITED RIGHT UPPER QUADRANT COMPARISON:  CT abdomen 05/15/2024 FINDINGS: Gallbladder: Surgically absent. Common bile duct: Diameter: Common bile duct is dilated measuring 2.1 cm. No definitive common bile duct stone seen, although evaluation of  the distal common bile duct is limited secondary to overlying bowel gas. Liver: No focal lesion identified. There is increase in parenchymal echogenicity. Portal vein is patent on color Doppler imaging with normal direction of blood flow towards the liver. Other: None. IMPRESSION: 1. Dilated common bile duct measuring up to 2.1 cm. No definitive common bile duct stone seen, although evaluation of the distal common bile duct is limited secondary to overlying bowel gas. Consider further evaluation with MRCP. 2. Hepatic steatosis. Electronically Signed   By: Greig Pique M.D.   On: 05/15/2024 20:29   CT ABDOMEN W WO CONTRAST Result Date: 05/15/2024 CLINICAL DATA:  Liver biopsy yesterday with new onset pain and vomiting. EXAM: CT ABDOMEN WITHOUT AND WITH CONTRAST TECHNIQUE: Multidetector CT imaging of the abdomen was performed following the standard protocol before and following the bolus administration of intravenous contrast. RADIATION DOSE REDUCTION: This exam was performed according to the departmental dose-optimization program which includes automated exposure control, adjustment of the mA and/or kV according to patient size and/or use of iterative reconstruction technique. CONTRAST:  ISOVUE -300 IOPAMIDOL  (ISOVUE -300) INJECTION 61% COMPARISON:  CT biopsy images obtained yesterday 05/14/2024; recent prior CT scan of the chest, abdomen and pelvis 03/11/2024. FINDINGS: Lower chest: No acute abnormality. Hepatobiliary: Hypoechoic solid lesion in the subcapsular and anterior aspect of segment 5 is essentially unchanged at 1.8 x 1.6 cm. No evidence of hemorrhage. Surgical changes of prior cholecystectomy with significant intra and extrahepatic biliary ductal dilatation which appears unchanged. High attenuation stone in the distal common bile duct consistent with choledocholithiasis. Pancreas: Unremarkable. No pancreatic ductal dilatation or surrounding inflammatory changes. Spleen: Normal in size without focal  abnormality. Adrenals/Urinary Tract: Normal left adrenal gland. Stable 1.2 cm left adrenal nodule. Complex cyst in the upper pole of the right kidney measures 3.2 cm and demonstrates internal Hounsfield units of approximately 40. This is similar to the unenhanced CT images from 03/11/2024 consistent with a hemorrhagic or proteinaceous cyst rather than an enhancing lesion. No definite enhancing lesion identified. Stomach/Bowel: No focal bowel wall thickening or evidence of obstruction. Vascular/Lymphatic: Scattered calcified atherosclerotic plaque. No evidence of aneurysm. No suspicious lymphadenopathy. Other: Surgical changes in the right breast. Small fat containing ventral hernia. No evidence of ascites. Musculoskeletal: No acute fracture or aggressive appearing lytic or blastic osseous lesion. IMPRESSION: 1. No evidence of hemorrhage or other complication related to the recent liver biopsy. 2. Similar appearance of severe intra and extrahepatic biliary ductal dilatation with associated choledocholithiasis. Recommend referral to gastroenterology if not previously obtained. 3. Additional ancillary findings as above without interval change. Electronically Signed   By: Wilkie Lent M.D.   On: 05/15/2024 16:04   CT LIVER MASS BIOPSY Result Date: 05/14/2024 INDICATION: 78 year old female with enlarging liver mass concerning for metastasis. EXAM: CT BIOPSY COMPARISON:  03/11/2024 MEDICATIONS: None. ANESTHESIA/SEDATION: Fentanyl  100 mcg IV; Versed  1.5 mg IV Sedation time: 10 minutes; The patient was continuously  monitored during the procedure by the interventional radiology nurse under my direct supervision. CONTRAST:  None. COMPLICATIONS: None immediate. PROCEDURE: RADIATION DOSE REDUCTION: This exam was performed according to the departmental dose-optimization program which includes automated exposure control, adjustment of the mA and/or kV according to patient size and/or use of iterative reconstruction  technique. Informed consent was obtained from the patient following an explanation of the procedure, risks, benefits and alternatives. A time out was performed prior to the initiation of the procedure. The patient was positioned supine on the CT table and a limited CT was performed for procedural planning demonstrating hypoattenuating round mass in the anterior right dome of the liver. The procedure was planned. The operative site was prepped and draped in the usual sterile fashion. Appropriate trajectory was confirmed with a 22 gauge spinal needle after the adjacent tissues were anesthetized with 1% Lidocaine  with epinephrine . Under intermittent CT guidance, a 17 gauge coaxial needle was advanced into the peripheral aspect of the mass. Appropriate positioning was confirmed and a total of 2 samples were obtained with an 18 gauge core needle biopsy device. The co-axial needle was removed and hemostasis was achieved with manual compression. A limited postprocedural CT was negative for hemorrhage or additional complication. A dressing was placed. The patient tolerated the procedure well without immediate postprocedural complication. IMPRESSION: Technically successful CT guided core needle biopsy of right lobe liver mass. Ester Sides, MD [facility] Radiology Electronically Signed   By: Ester Sides M.D.   On: 05/14/2024 16:45       ELIGIBLE FOR AVAILABLE RESEARCH PROTOCOL: no  ASSESSMENT: 78 y.o. [place] woman with a remote history of early stage endometrial cancer, subsequently status post right breast upper outer quadrant biopsy 01/22/2016 for a clinically multifocal T2 N0, stage 2A invasive ductal carcinoma, grade 1, estrogen and progesterone receptor positive, HER-2 negative, with an MIB-1 between 10 and 15%.  (1) right axillary lymph node biopsy 03/02/2016 positive  (2) genetics testing 01/13/2016 through the Custom gene panel offered by [employer]  found no deleterious mutations in  ATM, BARD1, BRCA1, BRCA2, BRIP1, CDH1, CHEK2, EPCAM, FANCC, MLH1, MSH2, MSH6, MUTYH, NBN, PALB2, PMS2, POLD1, PTEN, RAD51C, RAD51D, TP53, and XRCC2  METASTATIC DISEASE: OCT 2017 (3) CT scans of the chest abdomen and pelvis obtained 03/10/2016 are consistent with bilateral lung metastases and mediastinal and hilar nodal involvement, but no liver or bone spread  (a) bronchoscopic lymph node biopsy 2 (station 7, 13R) 03/28/2016 confirms metastatic adenocarcinoma, estrogen receptor positive, HER-2 not amplified  (b) baseline CA-27-29 on 04/18/2016 was 137.5.  (4) letrozole  started 03/15/2016, palbociclib  added 03/29/2016 at 125 mg/day, 21/7  (a) dose decreased to 100 mg per day, 21/7, beginning with February cycle  (b) palbociclib  held 01/31/2017, with increasing symptoms  (c) palbociclib  resumed October 2018 at 75 mg daily  (d) palbociclib  dose reduced to 75 mg every other day February through April 2019  (e) palbociclib  dose resumed at 75 mg daily as of 10/17/2017  (f) palbociclib  dose decreased to 75 mg every other day beginning 07/31/2019  (g) palbociclib  resumed at 75 mg daily, 21 days on 7 off, as of 08/25/2019  (5) status post double right lumpectomies and right axillary lymph node sampling 01/13/2017 for 2 separate invasive ductal carcinoma lesions, pT1a and pT1b, N1a, with negative margins, both lesions being estrogen and progesterone receptor positive and HER-2 negative  (6) adjuvant radiation completed 07/05/2017 1. 50.4 Gy in 28 fractions to the right breast and supraclavicular region  using whole-breast tangent fields. 2. Boost to the seroma delivered an additional 10 Gy in 5 fractions. The total dose was 60.4 Gy.  (7) restaging studies:  (a) CT scan of the chest and bone scan 06/23/2017 showed stable scattered very small lung nodules, no bone lesions  (b) CT of the chest 10/17/2017 showed no new or progressive metastatic disease in the chest. The  small left lower lobe pulmonary nodule is stable  (c) PET scan on 03/02/2018: shows no findings for residual or recurrent right breast cancer  (d) chest CT scan stable, questionable right renal cyst noted  (e) chest CT 09/18/2020 shows no measurable disease  (f) to the CT scan 04/29/2021 shows no evidence of active disease; a 2 cm thyroid  nodule was noted   (8) right upper pole renal lesion noted to be enlarging on CT scan 06/22/2017  (a) no uptake on PET scan obtained 03/02/2018  #9 Most recent imaging with no new suspicious mass or lymphadenopathy identified in the chest abdomen or pelvis.  Stable chronic pleural and parenchymal scarring densities in the right lung apex.  Stable chronic moderate to severe biliary ductal dilatation.  Stable chronic 11 mm left adrenal gland nodule.  No dedicated follow-up required   PLAN Assessment & Plan  Assessment and Plan  Metastatic ER+/PR+/HER2- breast cancer to liver Oligometastatic liver disease, ER/PR positive, HER2 negative, no targetable mutations. Previously on Ibrance  and letrozole , now on Faslodex  with variable injection site pain. Discussed alternative therapies including capecitabine and local ablative therapy. - Discontinued Ibrance . - Continue monthly Faslodex  injections for December and January. - Schedule repeat CT scan end of January or early February for hepatic lesion response. - Consult radiation oncology/interventional radiology for local ablative therapy. - Discussed oral capecitabine as future option if Faslodex  is not tolerated or ineffective. - Provided guidance on Xeloda side effects and emergency care for chest pain. - Engaged in shared decision making regarding Faslodex  versus oral chemotherapy, preference to continue Faslodex .  Biliary obstruction with sludge, post-ERCP with stent Recent biliary obstruction managed with ERCP and stent. Liver function improved but remains abnormal. - Ordered labs to assess hepatic function  post-ERCP and stent. - Scheduled repeat ERCP and stent removal/cleanout in February. - Reviewed ERCP pathology. - labs today show correction of bilirubin and tremendous improvement in LFT  Hypertension Chronic hypertension with elevated blood pressure today, likely due to pain and stress. - Assessed blood pressure manually in clinic.  Obesity Chronic obesity with recent weight reduction, though inconsistent. Nutritional habits and physical activity need improvement for health and oncologic outcomes. - Discussed importance of healthy nutrition and gradual increase in physical activity.  Mobility impairment due to pain and deconditioning Significant mobility impairment due to pain and deconditioning, with sedentary behavior and lower extremity edema. Family motivated to support improvement. - Discussed gradual increase in physical activity, including walking and pedal exerciser. - Recommended physical therapy evaluation for mobility and strengthening. - Provided education on risks of deconditioning and benefits of activity. - Encouraged incremental, sustainable changes in activity and diet with family support.  Total time spent: 40 min  Thank you for consulting us  in the care of this patient.  Please not hesitate contact us  with any additional questions or concerns.  *Total Encounter Time as defined by the Centers for Medicare and Medicaid Services includes, in addition to the face-to-face time of a patient visit (documented in the note above) non-face-to-face time: obtaining and reviewing outside history, ordering and reviewing medications, tests or procedures, care coordination (  communications with other health care professionals or caregivers) and documentation in the medical record.  Amber Stalls MD

## 2024-05-23 NOTE — Progress Notes (Unsigned)
 Foundation one request sent from liver biopsy from 05/17/2024

## 2024-05-25 ENCOUNTER — Encounter: Payer: Self-pay | Admitting: Hematology and Oncology

## 2024-05-29 ENCOUNTER — Other Ambulatory Visit (INDEPENDENT_AMBULATORY_CARE_PROVIDER_SITE_OTHER)

## 2024-05-29 ENCOUNTER — Encounter: Payer: Self-pay | Admitting: Nurse Practitioner

## 2024-05-29 ENCOUNTER — Ambulatory Visit: Admitting: Nurse Practitioner

## 2024-05-29 ENCOUNTER — Telehealth: Payer: Self-pay

## 2024-05-29 VITALS — BP 132/62 | HR 77 | Ht 64.0 in | Wt 219.0 lb

## 2024-05-29 DIAGNOSIS — K8040 Calculus of bile duct with cholecystitis, unspecified, without obstruction: Secondary | ICD-10-CM

## 2024-05-29 DIAGNOSIS — K219 Gastro-esophageal reflux disease without esophagitis: Secondary | ICD-10-CM

## 2024-05-29 LAB — HEPATIC FUNCTION PANEL
ALT: 51 U/L — ABNORMAL HIGH (ref 3–35)
AST: 20 U/L (ref 5–37)
Albumin: 4.1 g/dL (ref 3.5–5.2)
Alkaline Phosphatase: 66 U/L (ref 39–117)
Bilirubin, Direct: 0.2 mg/dL (ref 0.1–0.3)
Total Bilirubin: 0.9 mg/dL (ref 0.2–1.2)
Total Protein: 6.5 g/dL (ref 6.0–8.3)

## 2024-05-29 NOTE — Patient Instructions (Signed)
 Take Pantoprazole  40mg  daily 30 minutes before lunch.  _______________________________________________________  If your blood pressure at your visit was 140/90 or greater, please contact your primary care physician to follow up on this.  _______________________________________________________  If you are age 78 or older, your body mass index should be between 23-30. Your Body mass index is 37.59 kg/m. If this is out of the aforementioned range listed, please consider follow up with your Primary Care Provider.  If you are age 103 or younger, your body mass index should be between 19-25. Your Body mass index is 37.59 kg/m. If this is out of the aformentioned range listed, please consider follow up with your Primary Care Provider.   ________________________________________________________  [facility] providers would like to encourage you to use MYCHART to communicate with providers for non-urgent requests or questions.  Due to long hold times on the telephone, sending your provider a message by [facility] may be a faster and more efficient way to get a response.  Please allow 48 business hours for a response.  Please remember that this is for non-urgent requests.  _______________________________________________________  [facility] is using a team-based approach to care.  Your team is made up of your doctor and two to three APPS. Our APPS (Nurse Practitioners and Physician Assistants) work with your physician to ensure care continuity for you. They are fully qualified to address your health concerns and develop a treatment plan. They communicate directly with your gastroenterologist to care for you. Seeing the Advanced Practice Practitioners on your physician's team can help you by facilitating care more promptly, often allowing for earlier appointments, access to diagnostic testing, procedures, and other specialty referrals.

## 2024-05-29 NOTE — Progress Notes (Signed)
 05/29/2024 [name] [id number] 1945/11/28   Chief Complaint: Follow up after EGD  History of Present Illness: [name] is a 78 year old female with a past medical history of arthritis, hypertension, hyperlipidemia, breast cancer with liver metastasis, prior lung cancer, diabetes mellitus type 2, DVT not on AC, GERD and esophageal stenosis with recurrent dysphagia.   She was previously seen in office by Dr. Abran 01/31/2024 to having dysphagia and vomiting. She underwent an EGD 02/02/2024 due to having recurrent dysphagia.  The EGD showed esophagitis with a peptic stricture which was dilated, hiatal hernia and mild gastroduodenitis.  No evidence of H. pylori.  She was prescribed Pantoprazole  40 mg once daily with instructions to follow-up in office in 8 weeks.  She was subsequently admitted to the hospital 12/3 - 05/18/2024 due to having nausea and vomiting and abdominal pain status post liver biopsy 05/14/2024.  CT 05/15/2024 showed no evidence of hemorrhage but showed severe intra and extrahepatic biliary ductal dilation associated with choledocholithiasis, biliary ultrasound showed bile duct dilating 2.1 cm no stones appreciated. Admitted for ERCP. T.bili 5.1 -> 5.4. Alk phos 120 -> 126. AST 555 -> 438. ALT 906 -> 871. INR 1.2. Mildly tachycardic and hypertensive.  Received IV antibiotics.  She underwent an ERCP 05/17/2024 by Dr. Wilhelmenia showed the entire main duct was markedly dilated, a biliary sphincterotomy was performed, the biliary tree was swept and sludge was found and 1 plastic biliary stent was placed in the CBD to ensure adequate drainage.  Her clinical status remained stable and her LFTs drifted downward.  She was discharged home 05/18/2024 with plans to repeat a ERCP in 2 months for stent removal and final biliary cleanout.  Since discharge, she had a little nausea a few days ago and vomited up grits and milk without further recurrence.  She is tolerating small portions of  chicken, mashed potatoes, soups and sandwiches.  She denies having any abdominal pain.  No chest pain or shortness of breath.  She endorsed taking pantoprazole  40 mg x 1 dose as prescribed by Dr. Abran following her EGD 01/2024 as noted above but stopped taking it after she vomited as she was not sure if this medication was contributing to her vomiting episodes.  She underwent a hepatic panel prior to her scheduled appointment time this morning which did not result until after she was discharged.  LFTs continue to normalize with ALT 51, normal AST, total bili and alk phos levels.  See results below.     Latest Ref Rng & Units 05/23/2024    2:35 PM 05/18/2024    5:28 AM 05/17/2024    4:58 AM  CBC  WBC 4.0 - 10.5 K/uL 2.0  2.7  2.4   Hemoglobin 12.0 - 15.0 g/dL 87.3  88.9  88.6   Hematocrit 36.0 - 46.0 % 36.6  33.3  34.7   Platelets 150 - 400 K/uL 142  121  116        Latest Ref Rng & Units 05/29/2024   10:26 AM 05/23/2024    2:35 PM 05/18/2024    5:28 AM  CMP  Glucose 70 - 99 mg/dL  776  776   BUN 8 - 23 mg/dL  6  12   Creatinine 9.55 - 1.00 mg/dL  9.36  9.33   Sodium 864 - 145 mmol/L  138  138   Potassium 3.5 - 5.1 mmol/L  4.2  3.7   Chloride 98 - 111 mmol/L  103  104   CO2 22 - 32 mmol/L  24  24   Calcium  8.9 - 10.3 mg/dL  9.2  8.7   Total Protein 6.0 - 8.3 g/dL 6.5  6.8  5.7   Total Bilirubin 0.2 - 1.2 mg/dL 0.9  1.0  2.0   Alkaline Phos 39 - 117 U/L 66  89  126   AST 5 - 37 U/L 20  31  194   ALT 3 - 35 U/L 51  155  636      GI PROCEDURES:  EGD 02/02/2024: 1. GERD complicated by esophagitis and peptic stricture status post dilation  2. Hiatal hernia  3. Mild gastroduodenitis. Status post biopsies. 1. Surgical [P], gastric biopsy :       - MILD CHRONIC INACTIVE GASTRITIS.       - NO EVIDENCE OF H. PYLORI ON H&E STAIN.   ERCP 05/17/2024: - Z-line irregular.  - Erythematous mucosa in the stomach. Biopsied.  - J-shaped gastric deformity.  - No gross lesions in the duodenal  bulb, in the first portion of the duodenum and in the second portion of the duodenum.  - Small major papilla.  - The fluoroscopic examination was suspicious for sludge.  - The entire main bile duct was markedly dilated.  - A biliary sphincterotomy was performed.  - The biliary tree was swept and sludge was found (appearance of hemobilia and old blood products more than stone debris).  - Flow of contrast/bile was slow.  - One plastic biliary stent was placed into the common bile duct to ensure adequate drainage. . - Repeat ERCP in 26-months for stent removal and final biliary clean out.   Medications Ordered Prior to Encounter[1] Allergies[2]  Current Medications, Allergies, Past Medical History, Past Surgical History, Family History and Social History were reviewed in [employer] record.  Review of Systems:   Constitutional: Negative for fever, sweats, chills or weight loss.  Respiratory: Negative for shortness of breath.   Cardiovascular: Negative for chest pain, palpitations and leg swelling.  Gastrointestinal: See HPI.  Musculoskeletal: + Leg weakness.  Neurological: Negative for dizziness, headaches or paresthesias.   Physical Exam: BP 132/62 (BP Location: Left Arm, Patient Position: Sitting, Cuff Size: Large)   Pulse 77   Ht 5' 4 (1.626 m)   Wt 219 lb (99.3 kg)   BMI 37.59 kg/m  General: 78 year old female in no acute distress. Head: Normocephalic and atraumatic. Eyes: No scleral icterus. Conjunctiva pink . Ears: Normal auditory acuity. Mouth: Dentition intact. No ulcers or lesions.  Lungs: Clear throughout to auscultation. Heart: Regular rate and rhythm, no murmur. Abdomen: Soft, nontender and nondistended. No masses or hepatomegaly. Normal bowel sounds x 4 quadrants.  Rectal: Deferred. Musculoskeletal: Symmetrical with no gross deformities. Extremities: No edema. Neurological: Alert oriented x 4. No focal deficits.  Psychological: Alert and  cooperative. Normal mood and affect  Assessment and Recommendations:  78 year old female who was recently admitted to the hospital 12/3 - 05/18/2024 due to having nausea, vomiting and upper abdominal pain status post liver biopsy 12/2.  CT 07/17/2023 showed no evidence of hemorrhage but showed severe intra and extrahepatic biliary ductal dilation associated with choledocholithiasis, biliary ultrasound showed bile duct dilating 2.1 cm no stones appreciated.  Admission labs: T.bili 5.1 -> 5.4. Alk phos 120 -> 126. AST 555 -> 438. ALT 906 -> 871. INR 1.2. S/P ERCP 05/17/2024 by Dr. Wilhelmenia showed the entire main duct was markedly dilated, a biliary sphincterotomy was performed, the biliary tree  was swept and sludge was found and 1 plastic biliary stent was placed in the CBD to ensure adequate drainage.  LFTs continue to drift downward post procedure.  Had mild nausea and vomited grits and milk x 2 episodes without further recurrence.  No abdominal pain. - Patient is scheduled for a repeat ERCP for stent removal and final biliary cleanout with Dr. Wilhelmenia on 07/15/2024. - Heart healthy diet as tolerated - Patient to contact her office if she develops nausea, vomiting or abdominal pain  History of GERD and dysphagia.  EGD 02/02/2024 showed esophagitis with a peptic stricture which was dilated, hiatal hernia and mild gastroduodenitis.  No evidence of H. pylori.  She was prescribed Pantoprazole  40 mg once daily and only took 1 dose and stopped taking it as she was concerned the pantoprazole  may have triggered an episode of vomiting.  No recent dysphagia. - Patient willing to restart pantoprazole  40 mg once daily, she will stop if she experiences any vomiting associated after taking it  History of metastatic breast cancer, liver metastasis  DM type II     [1]  Current Outpatient Medications on File Prior to Visit  Medication Sig Dispense Refill   aspirin  EC 81 MG tablet Take 81 mg by mouth daily at 6 PM.  1700     brimonidine  (ALPHAGAN ) 0.2 % ophthalmic solution INSTILL 1 DROP INTO EACH EYE THREE TIMES DAILY     cholecalciferol (VITAMIN D) 1000 units tablet Take 1,000 Units by mouth daily.     dorzolamide -timolol  (COSOPT ) 22.3-6.8 MG/ML ophthalmic solution Place 1 drop into both eyes 2 (two) times daily.     gabapentin  (NEURONTIN ) 100 MG capsule Take 200 mg at bedtime, can increase to 300 mg at bedtime if tolerated 90 capsule 3   ketorolac  (ACULAR ) 0.5 % ophthalmic solution Place 1 drop into the right eye every morning.     lisinopril  (ZESTRIL ) 5 MG tablet Take 1 tablet by mouth once daily 90 tablet 0   lovastatin  (MEVACOR ) 20 MG tablet TAKE 1 TABLET BY MOUTH AT BEDTIME 90 tablet 3   Netarsudil -Latanoprost  (ROCKLATAN ) 0.02-0.005 % SOLN Place 1 drop into both eyes at bedtime.     ondansetron  (ZOFRAN -ODT) 4 MG disintegrating tablet Take 1 tablet (4 mg total) by mouth every 8 (eight) hours as needed for nausea or vomiting. 20 tablet 3   palbociclib  (IBRANCE ) 75 MG tablet Take 1 tablet (75 mg total) by mouth daily. Take for 21 days on, 7 days off, repeat every 28 days. 21 tablet 11   pioglitazone -metformin  (ACTOPLUS MET ) 15-850 MG tablet Take 1 tablet by mouth daily. 90 tablet 3   prednisoLONE  acetate (PRED FORTE ) 1 % ophthalmic suspension Place 1 drop into both eyes as directed. INSTILL ONE DROP INTO BOTH EYES AS DIRECTED TWICE A DAY INTO RIGHT EYE FOUR TIMES A DAY IN LEFT EYE     pantoprazole  (PROTONIX ) 40 MG tablet Take 1 tablet (40 mg total) by mouth daily. (Patient not taking: Reported on 05/29/2024) 30 tablet 11   No current facility-administered medications on file prior to visit.  [2]  Allergies Allergen Reactions   Codeine  Hives    Hycodan syrup   Fluorescein  Nausea And Vomiting    ? IV dye for retina specialist   Oxycodone  Nausea And Vomiting    Patient vomited for 3 days after taking   Lipitor [Atorvastatin  Calcium ] Other (See Comments)    Leg cramp   Sitagliptin Phosphate Nausea And  Vomiting   Sulfa Drugs Cross Reactors  Nausea And Vomiting

## 2024-05-29 NOTE — Telephone Encounter (Signed)
 Pt called and LVM requesting call back as she received a call from someone named Delon who did not leave appropriate call back instructions. Attempted to call pt to let her know this was most likely IR attempting to call her. LVM for call back.

## 2024-05-30 NOTE — Progress Notes (Signed)
 Attending Physician's Attestation   I have reviewed the chart.   I agree with the Advanced Practitioner's note, impression, and recommendations with any updates as below. Glad she is doing better.  Will get stent removed at time noted.   Aloha Finner, MD [facility] Advanced [facility]oscopy Office # [phone]

## 2024-05-30 NOTE — Progress Notes (Signed)
 Noted

## 2024-05-31 ENCOUNTER — Other Ambulatory Visit: Payer: Self-pay

## 2024-05-31 ENCOUNTER — Encounter (HOSPITAL_COMMUNITY): Payer: Self-pay | Admitting: Hematology and Oncology

## 2024-05-31 ENCOUNTER — Ambulatory Visit: Payer: Self-pay | Admitting: Gastroenterology

## 2024-06-04 ENCOUNTER — Other Ambulatory Visit: Payer: Self-pay | Admitting: Pharmacist

## 2024-06-04 ENCOUNTER — Other Ambulatory Visit: Payer: Self-pay

## 2024-06-04 ENCOUNTER — Other Ambulatory Visit: Payer: Self-pay | Admitting: Hematology and Oncology

## 2024-06-04 NOTE — Progress Notes (Signed)
 Ibrance  d/c'd at last office visit on 05/23/2024 by Dr. Loretha

## 2024-06-07 ENCOUNTER — Telehealth: Payer: Self-pay

## 2024-06-07 NOTE — Telephone Encounter (Addendum)
 PAP reenrollment for year 2026    Application has been submitted for Ibrance  through [employer] with both Patient and doctor signatures, along with requested documentation.  Status: Pending - Patients letter proving [employer] enrollment  with Medicare Part D plan *Patient no longer taking medication, application will not be finished  06/11/2024 I called [employer] and canceled enrollment application for Ibrance   Charlott Hamilton,  CPhT-Adv  she/her/hers [facility]  [facility] Pharmacy Technician [facility] Phone: [phone]  Fax: [phone] [email or web address]

## 2024-06-09 NOTE — Progress Notes (Signed)
 "     Chief Complaint: Patient was seen in consultation today for metastatic breast cancer; liver lesion.   Referring Physician(s): Iruku,Praveena  History of Present Illness: [name] is a 78 y.o. female with a medical history significant for DM2, DVT, HTN, TIA, PVD, obesity, esophageal stenosis with dysphagia, uterine cancer (hysterectomy 2020) and metastatic breast cancer initially diagnosed in 2017. She was treated with lumpectomies and chemoradiation. She had many years of imaging showing no evidence of disease. Recent imaging unfortunately showed progression of a liver lesion and she was seen in IR 05/14/24 for a biopsy. Post-procedure she developed significant pain but this subsided over the course of her bed rest period.   She continued to have pain and an outpatient CT abdomen was ordered. This showed biliary ductal dilation and choledocholithiasis with no hematomas observed. She was sent to the ED by her oncologist for GI evaluation and underwent ERCP with stent placement to the CBD on 05/17/24. Biopsies from this procedure were negative for malignancy. She was discharged from the hospital 05/18/24 with plans for a repeat ERCP in two months for stent removal and biliary cleanout.   Pathology from the liver lesion biopsy showed metastatic carcinoma consistent with breast primary. Her oncology team has referred the patient to Interventional Radiology to explore targeted therapy options for the liver lesion.  She has some persistent RUQ abdominal pain when sitting in certain positions.  She has a good appetite.  No nausea, vomiting, jaundice, or scleral icterus.    Past Medical History:  Diagnosis Date   Breast cancer (HCC)    Cancer (HCC) 02/2016   right breast   DIABETES MELLITUS, TYPE II 01/04/2007   only takes actoplus daily   Dizziness and giddiness 02/29/2008   DVT, HX OF    at age 33 in right buttocks   Dyspnea    due to lung cancer   Family history of breast cancer    GERD  01/04/2007   pt reports resolved    GLAUCOMA 07/30/2008   both eyes   History of blood transfusion    no abnormal  reaction   History of uterine cancer 2000   hysterectomy done   HYPERLIPIDEMIA 01/04/2007   taking Pravastatin  daily   HYPERTENSION 01/04/2007   takes Lisinopril  daily   Joint pain    Joint swelling    Leg cramps    LEG PAIN, LEFT 07/06/2007   NUMBNESS 07/30/2008   in fingers;pt states from Diamox    OSTEOARTHRITIS, HIP 09/25/2009   OTITIS MEDIA, ACUTE, BILATERAL 02/29/2008   Overweight(278.02) 01/04/2007   Peripheral vascular disease    Personal history of radiation therapy 2018   Pneumonia    PONV (postoperative nausea and vomiting)    SLEEP APNEA, OBSTRUCTIVE    doesn't use a cpap;study done about 90yrs ago   TRANSIENT ISCHEMIC ATTACK, HX OF 01/04/2007   Vision loss    left eye    Past Surgical History:  Procedure Laterality Date   ABDOMINAL HYSTERECTOMY  2000   BILIARY STENT PLACEMENT N/A 05/17/2024   Procedure: INSERTION, STENT, BILE DUCT;  Surgeon: Wilhelmenia Aloha Raddle., MD;  Location: [facility];  Service: Gastroenterology;  Laterality: N/A;   BIOPSY OF SKIN SUBCUTANEOUS TISSUE AND/OR MUCOUS MEMBRANE  05/17/2024   Procedure: BIOPSY, SKIN, SUBCUTANEOUS TISSUE, OR MUCOUS MEMBRANE;  Surgeon: Wilhelmenia Aloha Raddle., MD;  Location: [facility];  Service: Gastroenterology;;   BREAST LUMPECTOMY Right 01/13/2017   x2   BREAST LUMPECTOMY WITH RADIOACTIVE SEED AND SENTINEL LYMPH  NODE BIOPSY Right 01/13/2017   Procedure: RIGHT BREAST RADIOACTIVE SEED X'S 2 GUIDED LUMPECTOMY WITH RADIOACTIVE SEED TARGETED AXILLARYLYMPH NODE EXCISION AND RIGHT AXILLARY SENTINEL LYMPH NODE BIOPSY;  Surgeon: Ethyl Lenis, MD;  Location: [facility];  Service: General;  Laterality: Right;  2 SEEDS IN RIGHT BREAST 1 SEED IN RIGHT AXILLARY NODE   CHOLECYSTECTOMY     ENDOBRONCHIAL ULTRASOUND Bilateral 03/28/2016   Procedure: ENDOBRONCHIAL ULTRASOUND;  Surgeon: Lamar GORMAN Chris, MD;  Location: [facility];  Service: Cardiopulmonary;  Laterality: Bilateral;   ERCP N/A 05/17/2024   Procedure: ERCP, WITH INTERVENTION IF INDICATED;  Surgeon: Wilhelmenia Aloha Raddle., MD;  Location: [facility];  Service: Gastroenterology;  Laterality: N/A;   EYE SURGERY  13   shunt left and lazer eye surgery on right cataract and retenia tear with repair   growth removal  2004   from thumb   KNEE ARTHROSCOPY Right    mulitple eye surgeries     both eyes, cataracts with ioc done both eyes   OOPHORECTOMY     right lumpectomy with axillary node dissection Right 01/2017   SPHINCTEROTOMY  05/17/2024   Procedure: SPHINCTEROTOMY, BILIARY;  Surgeon: Wilhelmenia Aloha Raddle., MD;  Location: [facility];  Service: Gastroenterology;;   STONE EXTRACTION WITH BASKET  05/17/2024   Procedure: ERCP, WITH LITHROTRIPSY OR REMOVAL OF COMMON BILE DUCT CALCULUS USING BALLOON;  Surgeon: Wilhelmenia Aloha Raddle., MD;  Location: [facility];  Service: Gastroenterology;;   TOTAL HIP ARTHROPLASTY  06/24/2011   Procedure: TOTAL HIP ARTHROPLASTY;  Surgeon: Dempsey JINNY Sensor;  Location: [facility];  Service: Orthopedics;  Laterality: Right;   TOTAL HIP ARTHROPLASTY Left 11/12/2012   Dr Sensor   TOTAL HIP ARTHROPLASTY Left 11/12/2012   Procedure: TOTAL HIP ARTHROPLASTY;  Surgeon: Dempsey JINNY Sensor, MD;  Location: [facility];  Service: Orthopedics;  Laterality: Left;  DEPUY PINNACLE    Allergies: Codeine , Fluorescein , Oxycodone , Lipitor [atorvastatin  calcium ], Sitagliptin phosphate, and Sulfa drugs cross reactors  Medications: Prior to Admission medications  Medication Sig Start Date End Date Taking? Authorizing Provider  aspirin  EC 81 MG tablet Take 81 mg by mouth daily at 6 PM. 1700    [provider]  brimonidine  (ALPHAGAN ) 0.2 % ophthalmic solution INSTILL 1 DROP INTO EACH EYE THREE TIMES DAILY 11/23/20   [provider]  cholecalciferol (VITAMIN D) 1000 units tablet Take 1,000 Units by mouth daily.    [provider]   dorzolamide -timolol  (COSOPT ) 22.3-6.8 MG/ML ophthalmic solution Place 1 drop into both eyes 2 (two) times daily. 12/20/16   [provider]  gabapentin  (NEURONTIN ) 100 MG capsule Take 200 mg at bedtime, can increase to 300 mg at bedtime if tolerated 12/13/22   Geofm Glade JINNY, MD  ketorolac  (ACULAR ) 0.5 % ophthalmic solution Place 1 drop into the right eye every morning. 11/29/22   [provider]  lisinopril  (ZESTRIL ) 5 MG tablet Take 1 tablet by mouth once daily 03/18/24   Rollene Almarie LABOR, MD  lovastatin  (MEVACOR ) 20 MG tablet TAKE 1 TABLET BY MOUTH AT BEDTIME 01/22/24   Rollene Almarie LABOR, MD  Netarsudil -Latanoprost  (ROCKLATAN ) 0.02-0.005 % SOLN Place 1 drop into both eyes at bedtime. 06/30/20   Magrinat, Sandria BROCKS, MD  ondansetron  (ZOFRAN -ODT) 4 MG disintegrating tablet Take 1 tablet (4 mg total) by mouth every 8 (eight) hours as needed for nausea or vomiting. 04/24/24   Iruku, Praveena, MD  pantoprazole  (PROTONIX ) 40 MG tablet Take 1 tablet (40 mg total) by mouth daily. Patient not taking: Reported on 05/29/2024  02/02/24   Abran Norleen SAILOR, MD  pioglitazone -metformin  (ACTOPLUS MET ) 15-850 MG tablet Take 1 tablet by mouth daily. 09/21/23   Rollene Almarie LABOR, MD  prednisoLONE  acetate (PRED FORTE ) 1 % ophthalmic suspension Place 1 drop into both eyes as directed. INSTILL ONE DROP INTO BOTH EYES AS DIRECTED TWICE A DAY INTO RIGHT EYE FOUR TIMES A DAY IN LEFT EYE 01/16/20   [provider]     Family History  Problem Relation Age of Onset   Breast cancer Mother 77   Dementia Mother    Cancer Mother        Breast and lung cancer   Stroke Sister    Breast cancer Sister 11   Heart attack Maternal Aunt    Lung cancer Maternal Grandmother        non smoker   Glaucoma Maternal Grandfather    Anesthesia problems Neg Hx    Colon cancer Neg Hx    Esophageal cancer Neg Hx    Rectal cancer Neg Hx    Stomach cancer Neg Hx     Social History   Socioeconomic History    Marital status: Married    Spouse name: Debby   Number of children: Not on file   Years of education: Not on file   Highest education level: Not on file  Occupational History   Occupation: physiological scientist    Employer: [employer]   Occupation: retired  Tobacco Use   Smoking status: Never   Smokeless tobacco: Never  Vaping Use   Vaping status: Never Used  Substance and Sexual Activity   Alcohol use: No   Drug use: No   Sexual activity: Not Currently  Other Topics Concern   Not on file  Social History Narrative   Lives with husband/2025   Social Drivers of Health   Tobacco Use: Low Risk (05/29/2024)   Patient History    Smoking Tobacco Use: Never    Smokeless Tobacco Use: Never    Passive Exposure: Not on file  Financial Resource Strain: Low Risk (02/06/2023)   Overall Financial Resource Strain (CARDIA)    Difficulty of Paying Living Expenses: Not hard at all  Food Insecurity: No Food Insecurity (05/16/2024)   Epic    Worried About Radiation Protection Practitioner of Food in the Last Year: Never true    Ran Out of Food in the Last Year: Never true  Transportation Needs: No Transportation Needs (05/16/2024)   Epic    Lack of Transportation (Medical): No    Lack of Transportation (Non-Medical): No  Physical Activity: Inactive (05/02/2024)   Exercise Vital Sign    Days of Exercise per Week: 0 days    Minutes of Exercise per Session: 0 min  Stress: No Stress Concern Present (05/02/2024)   [employer] of Occupational Health - Occupational Stress Questionnaire    Feeling of Stress: Only a little  Social Connections: Socially Integrated (05/16/2024)   Social Connection and Isolation Panel    Frequency of Communication with Friends and Family: More than three times a week    Frequency of Social Gatherings with Friends and Family: Three times a week    Attends Religious Services: More than 4 times per year    Active Member of Clubs or Organizations: Yes    Attends Tax Inspector Meetings: Never    Marital Status: Married  Depression (PHQ2-9): Low Risk (05/02/2024)   Depression (PHQ2-9)    PHQ-2 Score: 1  Alcohol Screen: Low Risk (02/25/2022)  Alcohol Screen    Last Alcohol Screening Score (AUDIT): 0  Housing: Low Risk (05/16/2024)   Epic    Unable to Pay for Housing in the Last Year: No    Number of Times Moved in the Last Year: 0    Homeless in the Last Year: No  Utilities: Not At Risk (05/16/2024)   Epic    Threatened with loss of utilities: No  Health Literacy: Adequate Health Literacy (05/02/2024)   B1300 Health Literacy    Frequency of need for help with medical instructions: Never    Review of Systems: A 12 point ROS discussed and pertinent positives are indicated in the HPI above.  All other systems are negative.  Vital Signs: There were no vitals taken for this visit.  Advance Care Plan: The advanced care plan/surrogate decision maker was discussed at the time of visit and documented in the medical record.   Physical Exam Constitutional:      General: She is not in acute distress. HENT:     Head: Normocephalic.     Mouth/Throat:     Mouth: Mucous membranes are moist.  Eyes:     General: No scleral icterus. Cardiovascular:     Rate and Rhythm: Normal rate and regular rhythm.  Pulmonary:     Effort: No respiratory distress.  Abdominal:     General: There is no distension.  Musculoskeletal:     Right lower leg: No edema.     Left lower leg: No edema.  Skin:    General: Skin is warm and dry.     Coloration: Skin is not jaundiced.  Neurological:     Mental Status: She is alert and oriented to person, place, and time.     Imaging:  CT chest/abdomen/pelvis 03/11/24   Labs:  CBC: Recent Labs    05/15/24 1853 05/17/24 0458 05/18/24 0528 05/23/24 1435  WBC 4.2 2.4* 2.7* 2.0*  HGB 13.8 11.3* 11.0* 12.6  HCT 39.4 34.7* 33.3* 36.6  PLT 184 116* 121* 142*    COAGS: Recent Labs    05/14/24 1115 05/17/24 0458   INR 1.1 1.2    BMP: Recent Labs    05/16/24 2114 05/17/24 0458 05/18/24 0528 05/23/24 1435  NA 140 139 138 138  K 3.5 3.5 3.7 4.2  CL 105 105 104 103  CO2 24 23 24 24   GLUCOSE 166* 152* 223* 223*  BUN 9 9 12  6*  CALCIUM  8.4* 8.7* 8.7* 9.2  CREATININE 0.61 0.54 0.66 0.63  GFRNONAA >60 >60 >60 >60    LIVER FUNCTION TESTS: Recent Labs    05/17/24 0458 05/18/24 0528 05/23/24 1435 05/29/24 1026  BILITOT 5.4* 2.0* 1.0 0.9  AST 438* 194* 31 20  ALT 871* 636* 155* 51*  ALKPHOS 126 126 89 66  PROT 5.9* 5.7* 6.8 6.5  ALBUMIN  3.8 3.6 4.2 4.1    TUMOR MARKERS: No results for input(s): AFPTM, CEA, CA199, CHROMGRNA in the last 8760 hours.  Pathology: Liver mass bx 05/14/24   Assessment and Plan: 78 year old female with a history of oligostatic metastatic breast cancer to segment IV of the liver.  She has history of recent indeterminate biliary obstruction which has resolved with endoscopic stenting.   We discussed locoregional treatment options provided by Interventional Radiology to treat masses similar to hers.  I believe the mass is too close to the diaphragm for safe ablation.  Therefore, I believe a radiation segmentectomy approach with Y90 would provide similar ablative outcomes.  We discussed that  this is two separate procedures to include mapping then treatment.  She prefers right femoral access over radial access.  Risks and benefits discussed with the patient including, but not limited to bleeding, infection, vascular injury, post procedural pain, nausea, vomiting and fatigue, contrast induced renal failure, liver failure, radiation injury to the bowel, radiation induced cholecystitis, neutropenia and possible need for additional procedures.  All of the patient's questions were answered, patient is agreeable to proceed.  Plan for hepatic angiogram and Tc-MAA mapping with moderate sedation at [facility], followed 1-2 weeks later by Y-90 radiation segmentectomy,  likely to segment IVB.     Ester Sides, MD Pager: [phone]   I spent a total of  40 Minutes   in face to face in clinical consultation, greater than 50% of which was counseling/coordinating care for metastatic breast cancer with liver lesion.  "

## 2024-06-12 ENCOUNTER — Ambulatory Visit
Admission: RE | Admit: 2024-06-12 | Discharge: 2024-06-12 | Disposition: A | Discharge status: Home / Self Care | Source: Ambulatory Visit | Attending: Hematology and Oncology | Admitting: Hematology and Oncology

## 2024-06-12 DIAGNOSIS — C7800 Secondary malignant neoplasm of unspecified lung: Secondary | ICD-10-CM

## 2024-06-12 HISTORY — PX: IR RADIOLOGIST EVAL & MGMT: IMG5224

## 2024-06-14 ENCOUNTER — Other Ambulatory Visit: Payer: Self-pay | Admitting: Internal Medicine

## 2024-06-15 ENCOUNTER — Other Ambulatory Visit: Payer: Self-pay | Admitting: Internal Medicine

## 2024-06-17 ENCOUNTER — Other Ambulatory Visit (HOSPITAL_COMMUNITY): Payer: Self-pay

## 2024-06-18 DIAGNOSIS — C787 Secondary malignant neoplasm of liver and intrahepatic bile duct: Secondary | ICD-10-CM

## 2024-06-19 ENCOUNTER — Telehealth: Payer: Self-pay

## 2024-06-19 ENCOUNTER — Other Ambulatory Visit (HOSPITAL_COMMUNITY): Payer: Self-pay

## 2024-06-19 ENCOUNTER — Encounter: Payer: Self-pay | Admitting: Hematology and Oncology

## 2024-06-19 NOTE — Telephone Encounter (Signed)
 Pt called and LVM asking for call back.  Placed call to pt and she states someone has cancelled her surgery and she is needing help understanding why, but she states she will talk to Dr Loretha about it 1/8 at her visit. She has no other concerns at this time.

## 2024-06-20 ENCOUNTER — Other Ambulatory Visit: Payer: Self-pay | Admitting: *Deleted

## 2024-06-20 ENCOUNTER — Inpatient Hospital Stay: Attending: Hematology and Oncology

## 2024-06-20 ENCOUNTER — Inpatient Hospital Stay

## 2024-06-20 ENCOUNTER — Inpatient Hospital Stay: Admitting: Hematology and Oncology

## 2024-06-20 VITALS — BP 138/64 | HR 94 | Temp 98.5°F | Resp 17 | Wt 218.7 lb

## 2024-06-20 DIAGNOSIS — C787 Secondary malignant neoplasm of liver and intrahepatic bile duct: Secondary | ICD-10-CM | POA: Diagnosis not present

## 2024-06-20 DIAGNOSIS — C7800 Secondary malignant neoplasm of unspecified lung: Secondary | ICD-10-CM | POA: Diagnosis not present

## 2024-06-20 DIAGNOSIS — Z170 Estrogen receptor positive status [ER+]: Secondary | ICD-10-CM | POA: Diagnosis not present

## 2024-06-20 DIAGNOSIS — K831 Obstruction of bile duct: Secondary | ICD-10-CM | POA: Diagnosis not present

## 2024-06-20 DIAGNOSIS — Z803 Family history of malignant neoplasm of breast: Secondary | ICD-10-CM | POA: Insufficient documentation

## 2024-06-20 DIAGNOSIS — C7801 Secondary malignant neoplasm of right lung: Secondary | ICD-10-CM | POA: Diagnosis not present

## 2024-06-20 DIAGNOSIS — Z8542 Personal history of malignant neoplasm of other parts of uterus: Secondary | ICD-10-CM | POA: Diagnosis not present

## 2024-06-20 DIAGNOSIS — Z801 Family history of malignant neoplasm of trachea, bronchus and lung: Secondary | ICD-10-CM | POA: Diagnosis not present

## 2024-06-20 DIAGNOSIS — C50411 Malignant neoplasm of upper-outer quadrant of right female breast: Secondary | ICD-10-CM | POA: Diagnosis present

## 2024-06-20 DIAGNOSIS — Z923 Personal history of irradiation: Secondary | ICD-10-CM | POA: Diagnosis not present

## 2024-06-20 DIAGNOSIS — Z1721 Progesterone receptor positive status: Secondary | ICD-10-CM | POA: Insufficient documentation

## 2024-06-20 DIAGNOSIS — Z79811 Long term (current) use of aromatase inhibitors: Secondary | ICD-10-CM | POA: Insufficient documentation

## 2024-06-20 DIAGNOSIS — Z90710 Acquired absence of both cervix and uterus: Secondary | ICD-10-CM | POA: Diagnosis not present

## 2024-06-20 DIAGNOSIS — C7802 Secondary malignant neoplasm of left lung: Secondary | ICD-10-CM | POA: Insufficient documentation

## 2024-06-20 DIAGNOSIS — Z1732 Human epidermal growth factor receptor 2 negative status: Secondary | ICD-10-CM | POA: Diagnosis not present

## 2024-06-20 LAB — CBC WITH DIFFERENTIAL/PLATELET
Abs Immature Granulocytes: 0.18 K/uL — ABNORMAL HIGH (ref 0.00–0.07)
Basophils Absolute: 0 K/uL (ref 0.0–0.1)
Basophils Relative: 0 %
Eosinophils Absolute: 0.2 K/uL (ref 0.0–0.5)
Eosinophils Relative: 2 %
HCT: 37.9 % (ref 36.0–46.0)
Hemoglobin: 13.1 g/dL (ref 12.0–15.0)
Immature Granulocytes: 2 %
Lymphocytes Relative: 14 %
Lymphs Abs: 1.1 K/uL (ref 0.7–4.0)
MCH: 30.5 pg (ref 26.0–34.0)
MCHC: 34.6 g/dL (ref 30.0–36.0)
MCV: 88.1 fL (ref 80.0–100.0)
Monocytes Absolute: 0.6 K/uL (ref 0.1–1.0)
Monocytes Relative: 7 %
Neutro Abs: 5.9 K/uL (ref 1.7–7.7)
Neutrophils Relative %: 75 %
Platelets: 216 K/uL (ref 150–400)
RBC: 4.3 MIL/uL (ref 3.87–5.11)
RDW: 13.4 % (ref 11.5–15.5)
WBC: 7.9 K/uL (ref 4.0–10.5)
nRBC: 0 % (ref 0.0–0.2)

## 2024-06-20 LAB — CMP (CANCER CENTER ONLY)
ALT: 22 U/L (ref 0–44)
AST: 21 U/L (ref 15–41)
Albumin: 4.2 g/dL (ref 3.5–5.0)
Alkaline Phosphatase: 76 U/L (ref 38–126)
Anion gap: 11 (ref 5–15)
BUN: 16 mg/dL (ref 8–23)
CO2: 26 mmol/L (ref 22–32)
Calcium: 9.2 mg/dL (ref 8.9–10.3)
Chloride: 100 mmol/L (ref 98–111)
Creatinine: 0.72 mg/dL (ref 0.44–1.00)
GFR, Estimated: >60 mL/min
Glucose, Bld: 278 mg/dL — ABNORMAL HIGH (ref 70–99)
Potassium: 4.4 mmol/L (ref 3.5–5.1)
Sodium: 136 mmol/L (ref 135–145)
Total Bilirubin: 0.5 mg/dL (ref 0.0–1.2)
Total Protein: 7.2 g/dL (ref 6.5–8.1)

## 2024-06-20 NOTE — Progress Notes (Signed)
 " [facility]  Telephone:([phone]) [phone] Fax:([phone]     ID: [name] DOB: 1945-08-22  MR#: [id number]  RDW#:[id number]  Patient Care Team: Rollene Almarie LABOR, MD as PCP - General (Internal Medicine) Ethyl Lenis, MD as Consulting Physician (General Surgery) Dewey Rush, MD as Consulting Physician (Radiation Oncology) Sarrah Browning, MD as Consulting Physician (Obstetrics and Gynecology) Marleen Redell SAUNDERS, MD as Referring Physician (Specialist) Shelah Lamar RAMAN, MD as Consulting Physician (Pulmonary Disease) Alvaro Ricardo KATHEE Raddle., MD as Consulting Physician (Urology) Nilsa, Dempsey Pac, MD as Referring Physician (Ophthalmology) Lucila Rush LABOR, RPH-CPP (Pharmacist) Austin Olam CROME, MD as Consulting Physician (Ophthalmology) Loretha Ash, MD as Consulting Physician (Hematology and Oncology) Loretha Ash, MD as Consulting Physician (Hematology and Oncology)  CHIEF COMPLAINT: Estrogen receptor positive breast cancer  CURRENT TREATMENT: Letrozole , palbociclib   INTERVAL HISTORY:  History of Present Illness  [name] is a 79 year old female with hepatocellular carcinoma who presents for oncology follow-up and management of persistent injection site pain.  She is in the pre-procedural phase for Y-90 radioembolization, with preparatory imaging and shunt management scheduled for January 22 and July 17, 2024. She expresses ongoing confusion regarding the sequence and details of the upcoming radiology procedures, including dye injection, shunt management, and radioactive pellet placement. She has discussed these concerns with providers, and seeks clarification regarding procedural risks, benefits, recurrence likelihood, and safety.  She continues to experience significant pain at the site of her most recent intramuscular injection, which has persisted since the last visit and is exacerbated by movement, particularly when rising from a seated position. She  notes that the pain has lasted longer with each subsequent injection, stating, the first two shots didn't hurt...this one, it's hurt a little bit longer every time. The pain is currently present and has impacted her mobility and comfort. She denies new or worsening systemic symptoms such as fevers, chills, weight loss, or night sweats.  She doesn't want to proceed with any more shots, she would rather proceed with Y 90.  Rest of the pertinent 10 point ROS reviewed and negative.  We are continuing to follow her tumor marker: Lab Results  Component Value Date   CA2729 53.1 (H) 04/16/2024   CA2729 46.1 (H) 03/21/2024   CA2729 50.7 (H) 01/02/2024   CA2729 48.7 (H) 09/19/2023   CA2729 35.9 06/26/2023    REVIEW OF SYSTEMS:   A detailed review of systems today was otherwise stable.   COVID 19 VACCINATION STATUS: Status post Pfizer x2 followed by booster August 2021; had COVID November 2022   BREAST CANCER HISTORY: From the original intake note:  [name] had screening mammography showing some suspicious calcifications in the right breast leading to right diagnostic mammography with ultrasonography 01/22/2016 at [place]. The breast density was category C. In the upper right breast there was a 2.3 cm mass with additional masses measuring 0.9 and 0.7 cm. There was also a possible additional 0.8 mass in the lower inner quadrant. Ultrasound confirmed an irregular hypoechoic mass in the right breast upper outer quadrant measuring 2.0 cm. There were other masses measuring 0.7 and 0.8 cm by ultrasonography. The right axilla was sonographically benign.  Biopsy of a 12:00 and 4:00 mass in the right breast 01/22/2016 showed ([id number]) both specimens showing invasive ductal carcinoma, grade 1 or 2, both 95% estrogen receptor positive, both 95% progesterone receptor positive, both with strong staining intensity, with MIB-1 ranging from 10-15%, and both HER-2 negative, the signals ratio being 1.23-1.42, and  the  number per cell 1.85-2.59.  Her subsequent history is as detailed below   PAST MEDICAL HISTORY: Past Medical History:  Diagnosis Date   Breast cancer (HCC)    Cancer (HCC) 02/2016   right breast   DIABETES MELLITUS, TYPE II 01/04/2007   only takes actoplus daily   Dizziness and giddiness 02/29/2008   DVT, HX OF    at age 66 in right buttocks   Dyspnea    due to lung cancer   Family history of breast cancer    GERD 01/04/2007   pt reports resolved    GLAUCOMA 07/30/2008   both eyes   History of blood transfusion    no abnormal  reaction   History of uterine cancer 2000   hysterectomy done   HYPERLIPIDEMIA 01/04/2007   taking Pravastatin  daily   HYPERTENSION 01/04/2007   takes Lisinopril  daily   Joint pain    Joint swelling    Leg cramps    LEG PAIN, LEFT 07/06/2007   NUMBNESS 07/30/2008   in fingers;pt states from Diamox    OSTEOARTHRITIS, HIP 09/25/2009   OTITIS MEDIA, ACUTE, BILATERAL 02/29/2008   Overweight(278.02) 01/04/2007   Peripheral vascular disease    Personal history of radiation therapy 2018   Pneumonia    PONV (postoperative nausea and vomiting)    SLEEP APNEA, OBSTRUCTIVE    doesn't use a cpap;study done about 11yrs ago   TRANSIENT ISCHEMIC ATTACK, HX OF 01/04/2007   Vision loss    left eye    PAST SURGICAL HISTORY: Past Surgical History:  Procedure Laterality Date   ABDOMINAL HYSTERECTOMY  2000   BILIARY STENT PLACEMENT N/A 05/17/2024   Procedure: INSERTION, STENT, BILE DUCT;  Surgeon: Wilhelmenia Aloha Raddle., MD;  Location: [facility];  Service: Gastroenterology;  Laterality: N/A;   BIOPSY OF SKIN SUBCUTANEOUS TISSUE AND/OR MUCOUS MEMBRANE  05/17/2024   Procedure: BIOPSY, SKIN, SUBCUTANEOUS TISSUE, OR MUCOUS MEMBRANE;  Surgeon: Mansouraty, Aloha Raddle., MD;  Location: [facility];  Service: Gastroenterology;;   BREAST LUMPECTOMY Right 01/13/2017   x2   BREAST LUMPECTOMY WITH RADIOACTIVE SEED AND SENTINEL LYMPH NODE BIOPSY Right 01/13/2017    Procedure: RIGHT BREAST RADIOACTIVE SEED X'S 2 GUIDED LUMPECTOMY WITH RADIOACTIVE SEED TARGETED AXILLARYLYMPH NODE EXCISION AND RIGHT AXILLARY SENTINEL LYMPH NODE BIOPSY;  Surgeon: Ethyl Lenis, MD;  Location: [facility];  Service: General;  Laterality: Right;  2 SEEDS IN RIGHT BREAST 1 SEED IN RIGHT AXILLARY NODE   CHOLECYSTECTOMY     ENDOBRONCHIAL ULTRASOUND Bilateral 03/28/2016   Procedure: ENDOBRONCHIAL ULTRASOUND;  Surgeon: Lamar GORMAN Chris, MD;  Location: [facility];  Service: Cardiopulmonary;  Laterality: Bilateral;   ERCP N/A 05/17/2024   Procedure: ERCP, WITH INTERVENTION IF INDICATED;  Surgeon: Wilhelmenia Aloha Raddle., MD;  Location: [facility];  Service: Gastroenterology;  Laterality: N/A;   EYE SURGERY  13   shunt left and lazer eye surgery on right cataract and retenia tear with repair   growth removal  2004   from thumb   IR RADIOLOGIST EVAL & MGMT  06/12/2024   KNEE ARTHROSCOPY Right    mulitple eye surgeries     both eyes, cataracts with ioc done both eyes   OOPHORECTOMY     right lumpectomy with axillary node dissection Right 01/2017   SPHINCTEROTOMY  05/17/2024   Procedure: SPHINCTEROTOMY, BILIARY;  Surgeon: Wilhelmenia Aloha Raddle., MD;  Location: [facility];  Service: Gastroenterology;;   STONE EXTRACTION WITH BASKET  05/17/2024   Procedure: ERCP, WITH LITHROTRIPSY OR REMOVAL OF COMMON BILE DUCT CALCULUS  USING BALLOON;  Surgeon: Mansouraty, Aloha Raddle., MD;  Location: [facility];  Service: Gastroenterology;;   TOTAL HIP ARTHROPLASTY  06/24/2011   Procedure: TOTAL HIP ARTHROPLASTY;  Surgeon: Dempsey JINNY Sensor;  Location: [facility];  Service: Orthopedics;  Laterality: Right;   TOTAL HIP ARTHROPLASTY Left 11/12/2012   Dr Sensor   TOTAL HIP ARTHROPLASTY Left 11/12/2012   Procedure: TOTAL HIP ARTHROPLASTY;  Surgeon: Dempsey JINNY Sensor, MD;  Location: [facility];  Service: Orthopedics;  Laterality: Left;  DEPUY PINNACLE    FAMILY HISTORY Family History  Problem Relation Age of Onset   Breast  cancer Mother 6   Dementia Mother    Cancer Mother        Breast and lung cancer   Stroke Sister    Breast cancer Sister 51   Heart attack Maternal Aunt    Lung cancer Maternal Grandmother        non smoker   Glaucoma Maternal Grandfather    Anesthesia problems Neg Hx    Colon cancer Neg Hx    Esophageal cancer Neg Hx    Rectal cancer Neg Hx    Stomach cancer Neg Hx   The patient's father died at age 60, the patient's mother died at age 50. She had breast and lung cancers diagnosed shortly before her death. The patient had no brothers, 2 sisters. One sister was diagnosed with breast cancer at the age of 50.   GYNECOLOGIC HISTORY:  No LMP recorded. Patient has had a hysterectomy. Menarche age 75, first live birth age 80, the patient is GX P1. She had a hysterectomy for endometrial cancer in the year 2000. She did not take hormone replacement. She did use oral contraceptives for more than 20 years remotely, with no complications.   SOCIAL HISTORY: (Updated July 2021). 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 She is home with her husband Debby. He is a retired curator.Their son Carlin also lives in [place].  The patient has 3 grandchildren aged 54, 29 and 60    ADVANCED DIRECTIVES: In place   HEALTH MAINTENANCE: Social History   Tobacco Use   Smoking status: Never   Smokeless tobacco: Never  Vaping Use   Vaping status: Never Used  Substance Use Topics   Alcohol use: No   Drug use: No     Colonoscopy: Never  PAP: Status post hysterectomy  Bone density: Remote   Allergies  Allergen Reactions   Codeine  Hives    Hycodan syrup   Fluorescein  Nausea And Vomiting    ? IV dye for retina specialist   Oxycodone  Nausea And Vomiting    Patient vomited for 3 days after taking   Lipitor [Atorvastatin  Calcium ] Other (See Comments)    Leg cramp   Sitagliptin Phosphate Nausea And Vomiting   Sulfa Drugs Cross  Reactors Nausea And Vomiting    Current Outpatient Medications  Medication Sig Dispense Refill   aspirin  EC 81 MG tablet Take 81 mg by mouth daily at 6 PM. 1700     brimonidine  (ALPHAGAN ) 0.2 % ophthalmic solution INSTILL 1 DROP INTO EACH EYE THREE TIMES DAILY     cholecalciferol (VITAMIN D) 1000 units tablet Take 1,000 Units by mouth daily.     dorzolamide -timolol  (COSOPT ) 22.3-6.8 MG/ML ophthalmic solution Place 1 drop into both eyes 2 (two) times daily.     gabapentin  (NEURONTIN ) 100 MG capsule Take 200 mg at bedtime, can increase to 300  mg at bedtime if tolerated 90 capsule 3   ketorolac  (ACULAR ) 0.5 % ophthalmic solution Place 1 drop into the right eye every morning.     lisinopril  (ZESTRIL ) 5 MG tablet Take 1 tablet by mouth once daily 90 tablet 0   lovastatin  (MEVACOR ) 20 MG tablet TAKE 1 TABLET BY MOUTH AT BEDTIME 90 tablet 3   Netarsudil -Latanoprost  (ROCKLATAN ) 0.02-0.005 % SOLN Place 1 drop into both eyes at bedtime.     ondansetron  (ZOFRAN -ODT) 4 MG disintegrating tablet Take 1 tablet (4 mg total) by mouth every 8 (eight) hours as needed for nausea or vomiting. 20 tablet 3   pioglitazone -metformin  (ACTOPLUS MET ) 15-850 MG tablet Take 1 tablet by mouth daily. 90 tablet 3   prednisoLONE  acetate (PRED FORTE ) 1 % ophthalmic suspension Place 1 drop into both eyes as directed. INSTILL ONE DROP INTO BOTH EYES AS DIRECTED TWICE A DAY INTO RIGHT EYE FOUR TIMES A DAY IN LEFT EYE     pantoprazole  (PROTONIX ) 40 MG tablet Take 1 tablet (40 mg total) by mouth daily. (Patient not taking: Reported on 06/20/2024) 30 tablet 11   No current facility-administered medications for this visit.     OBJECTIVE: white woman in no acute distress  Vitals:   06/20/24 1228  BP: 138/64  Pulse: 94  Resp: 17  Temp: 98.5 F (36.9 C)  SpO2: 95%     Wt Readings from Last 3 Encounters:  06/20/24 218 lb 11.2 oz (99.2 kg)  05/29/24 219 lb (99.3 kg)  05/23/24 223 lb 6.4 oz (101.3 kg)   Body mass index is  37.54 kg/m.    ECOG FS:1 - Symptomatic but completely ambulatory  She is no acute distress  LAB RESULTS:  CMP     Component Value Date/Time   NA 136 06/20/2024 1151   NA 140 05/29/2017 1254   K 4.4 06/20/2024 1151   K 4.4 05/29/2017 1254   CL 100 06/20/2024 1151   CO2 26 06/20/2024 1151   CO2 25 05/29/2017 1254   GLUCOSE 278 (H) 06/20/2024 1151   GLUCOSE 154 (H) 05/29/2017 1254   BUN 16 06/20/2024 1151   BUN 11.5 05/29/2017 1254   CREATININE 0.72 06/20/2024 1151   CREATININE 0.8 05/29/2017 1254   CALCIUM  9.2 06/20/2024 1151   CALCIUM  9.3 05/29/2017 1254   PROT 7.2 06/20/2024 1151   PROT 7.0 05/29/2017 1254   ALBUMIN  4.2 06/20/2024 1151   ALBUMIN  4.1 05/29/2017 1254   AST 21 06/20/2024 1151   AST 13 05/29/2017 1254   ALT 22 06/20/2024 1151   ALT 14 05/29/2017 1254   ALKPHOS 76 06/20/2024 1151   ALKPHOS 66 05/29/2017 1254   BILITOT 0.5 06/20/2024 1151   BILITOT 0.50 05/29/2017 1254   GFRNONAA >60 06/20/2024 1151   GFRAA >60 03/10/2020 1138    INo results found for: SPEP, UPEP  Lab Results  Component Value Date   WBC 7.9 06/20/2024   NEUTROABS 5.9 06/20/2024   HGB 13.1 06/20/2024   HCT 37.9 06/20/2024   MCV 88.1 06/20/2024   PLT 216 06/20/2024      Chemistry      Component Value Date/Time   NA 136 06/20/2024 1151   NA 140 05/29/2017 1254   K 4.4 06/20/2024 1151   K 4.4 05/29/2017 1254   CL 100 06/20/2024 1151   CO2 26 06/20/2024 1151   CO2 25 05/29/2017 1254   BUN 16 06/20/2024 1151   BUN 11.5 05/29/2017 1254   CREATININE 0.72 06/20/2024 1151  CREATININE 0.8 05/29/2017 1254      Component Value Date/Time   CALCIUM  9.2 06/20/2024 1151   CALCIUM  9.3 05/29/2017 1254   ALKPHOS 76 06/20/2024 1151   ALKPHOS 66 05/29/2017 1254   AST 21 06/20/2024 1151   AST 13 05/29/2017 1254   ALT 22 06/20/2024 1151   ALT 14 05/29/2017 1254   BILITOT 0.5 06/20/2024 1151   BILITOT 0.50 05/29/2017 1254       No results found for: LABCA2  No components  found for: LABCA125  No results for input(s): INR in the last 168 hours.   Urinalysis    Component Value Date/Time   COLORURINE AMBER (A) 05/15/2024 2134   APPEARANCEUR CLEAR 05/15/2024 2134   LABSPEC >1.046 (H) 05/15/2024 2134   PHURINE 5.0 05/15/2024 2134   GLUCOSEU >=500 (A) 05/15/2024 2134   GLUCOSEU NEGATIVE 11/25/2022 1005   HGBUR NEGATIVE 05/15/2024 2134   BILIRUBINUR NEGATIVE 05/15/2024 2134   BILIRUBINUR neg 04/29/2024 1441   KETONESUR 20 (A) 05/15/2024 2134   PROTEINUR 100 (A) 05/15/2024 2134   UROBILINOGEN negative (A) 04/29/2024 1441   UROBILINOGEN 0.2 11/25/2022 1005   NITRITE NEGATIVE 05/15/2024 2134   LEUKOCYTESUR NEGATIVE 05/15/2024 2134    STUDIES: IR Radiologist Eval & Mgmt Result Date: 06/12/2024 EXAM: NEW PATIENT OFFICE VISIT CHIEF COMPLAINT: See Epic note. HISTORY OF PRESENT ILLNESS: See Epic note. REVIEW OF SYSTEMS: See Epic note. PHYSICAL EXAMINATION: See Epic note. ASSESSMENT AND PLAN: See Epic note. Ester Sides, MD [facility] Radiology Electronically Signed   By: Ester Sides M.D.   On: 06/12/2024 14:40       ELIGIBLE FOR AVAILABLE RESEARCH PROTOCOL: no  ASSESSMENT: 79 y.o. [place] woman with a remote history of early stage endometrial cancer, subsequently status post right breast upper outer quadrant biopsy 01/22/2016 for a clinically multifocal T2 N0, stage 2A invasive ductal carcinoma, grade 1, estrogen and progesterone receptor positive, HER-2 negative, with an MIB-1 between 10 and 15%.  (1) right axillary lymph node biopsy 03/02/2016 positive  (2) genetics testing 01/13/2016 through the Custom gene panel offered by [facility] found no deleterious mutations in  ATM, BARD1, BRCA1, BRCA2, BRIP1, CDH1, CHEK2, EPCAM, FANCC, MLH1, MSH2, MSH6, MUTYH, NBN, PALB2, PMS2, POLD1, PTEN, RAD51C, RAD51D, TP53, and XRCC2  METASTATIC DISEASE: OCT 2017 (3) CT scans of the chest abdomen and pelvis  obtained 03/10/2016 are consistent with bilateral lung metastases and mediastinal and hilar nodal involvement, but no liver or bone spread  (a) bronchoscopic lymph node biopsy 2 (station 7, 13R) 03/28/2016 confirms metastatic adenocarcinoma, estrogen receptor positive, HER-2 not amplified  (b) baseline CA-27-29 on 04/18/2016 was 137.5.  (4) letrozole  started 03/15/2016, palbociclib  added 03/29/2016 at 125 mg/day, 21/7  (a) dose decreased to 100 mg per day, 21/7, beginning with February cycle  (b) palbociclib  held 01/31/2017, with increasing symptoms  (c) palbociclib  resumed October 2018 at 75 mg daily  (d) palbociclib  dose reduced to 75 mg every other day February through April 2019  (e) palbociclib  dose resumed at 75 mg daily as of 10/17/2017  (f) palbociclib  dose decreased to 75 mg every other day beginning 07/31/2019  (g) palbociclib  resumed at 75 mg daily, 21 days on 7 off, as of 08/25/2019  (5) status post double right lumpectomies and right axillary lymph node sampling 01/13/2017 for 2 separate invasive ductal carcinoma lesions, pT1a and pT1b, N1a, with negative margins, both lesions being estrogen and progesterone receptor positive and HER-2 negative  (6) adjuvant radiation completed 07/05/2017  1. 50.4 Gy in 28 fractions to the right breast and supraclavicular region using whole-breast tangent fields. 2. Boost to the seroma delivered an additional 10 Gy in 5 fractions. The total dose was 60.4 Gy.  (7) restaging studies:  (a) CT scan of the chest and bone scan 06/23/2017 showed stable scattered very small lung nodules, no bone lesions  (b) CT of the chest 10/17/2017 showed no new or progressive metastatic disease in the chest. The small left lower lobe pulmonary nodule is stable  (c) PET scan on 03/02/2018: shows no findings for residual or recurrent right breast cancer  (d) chest CT scan stable, questionable right renal cyst noted  (e) chest CT 09/18/2020 shows no measurable  disease  (f) to the CT scan 04/29/2021 shows no evidence of active disease; a 2 cm thyroid  nodule was noted   (8) right upper pole renal lesion noted to be enlarging on CT scan 06/22/2017  (a) no uptake on PET scan obtained 03/02/2018  #9 Most recent imaging with no new suspicious mass or lymphadenopathy identified in the chest abdomen or pelvis.  Stable chronic pleural and parenchymal scarring densities in the right lung apex.  Stable chronic moderate to severe biliary ductal dilatation.  Stable chronic 11 mm left adrenal gland nodule.  No dedicated follow-up required   PLAN Assessment & Plan  Assessment and Plan  Metastatic ER+/PR+/HER2- breast cancer to liver Oligometastatic liver disease, ER/PR positive, HER2 negative,-  Discontinued Ibrance  and intramuscular injections due to intolerance and persistent pain. - Y-90 radioembolization scheduled for January 22 and February 4, with shunt removal as planned. - Coordinated with interventional radiology to maintain Y-90 procedure schedule. - Planned post-Y-90 imaging to assess treatment response. - Discussed potential for additional targeted therapies if disease recurs or progresses, based on prior mutation testing. She has PIK3CA and ESR 1 mutation - Permitted resumption of immune support supplements and biotin. - Scheduled follow-up visit for end of February after recovery from Y-90.  Biliary obstruction with sludge, post-ERCP with stent Recent biliary obstruction managed with ERCP and stent. Liver function improved - labs today show correction of bilirubin and normal LFT  Once again, I encouraged her to make sure source of information is trustworthy because she tends to ask multiple people same question and cross check the information. She apparently asked the phlebotomist today if she should have radial access or groin access for Y 90. I told her that the phlebotomist may have knowledge about these procedures, I encouraged her to direct  her questions to IR at her next appt.  Total time spent: 30 min  Thank you for consulting us  in the care of this patient.  Please not hesitate contact us  with any additional questions or concerns.  *Total Encounter Time as defined by the Centers for Medicare and Medicaid Services includes, in addition to the face-to-face time of a patient visit (documented in the note above) non-face-to-face time: obtaining and reviewing outside history, ordering and reviewing medications, tests or procedures, care coordination (communications with other health care professionals or caregivers) and documentation in the medical record.  Amber Stalls MD   "

## 2024-06-21 ENCOUNTER — Ambulatory Visit: Payer: Self-pay | Admitting: Hematology and Oncology

## 2024-06-21 LAB — CANCER ANTIGEN 27.29: CA 27.29: 23 U/mL (ref 0.0–38.6)

## 2024-06-24 ENCOUNTER — Telehealth: Payer: Self-pay

## 2024-06-24 ENCOUNTER — Other Ambulatory Visit (HOSPITAL_COMMUNITY): Payer: Self-pay

## 2024-06-24 NOTE — Telephone Encounter (Signed)
 Oral Oncology Patient Advocate Encounter   Was successful in securing patient a $7000 grant from [employer] ([employer]) to provide copayment coverage for Oral Oncolgy medication Breast Cancer Related.  This will keep the out of pocket expense at $0.     The billing information is as follows and has been shared with [facility].   RxBin: [id number] PCN:  [id number] Member ID: [id number] Group ID: [id number] Dates of Eligibility: 12/20/2023 through 06/17/2025   Charlott Hamilton,  CPhT-Adv  she/her/hers [place]  [facility] Pharmacy Technician Patient Advocate Specialist [facility] Phone: [phone]  Fax: [phone] [email or web address]

## 2024-07-01 ENCOUNTER — Encounter (HOSPITAL_COMMUNITY): Payer: Self-pay | Admitting: Gastroenterology

## 2024-07-01 ENCOUNTER — Other Ambulatory Visit: Payer: Self-pay

## 2024-07-01 NOTE — Progress Notes (Signed)
 Date of COVID positive in last 90 days:  PCP - Almarie Cleveland, MD Cardiologist -  Oncologist- Amber Stalls, MD  Chest CT- 03/11/24 Epic Chest x-ray - N/A EKG - 05/15/24 Epic tachycardic at 115 Stress Test - N/A ECHO - 2007 Cardiac Cath - N/A Pacemaker/ICD device last checked:N/A Spinal Cord Stimulator:N/A  Bowel Prep - NPO after midnight  Sleep Study - years ago per pt CPAP - no per pt  Fasting Blood Sugar - 238 06/30/24 Checks Blood Sugar  does not check often per pt  Last dose of GLP1 agonist-  N/A GLP1 instructions:  Do not take after     Last dose of SGLT-2 inhibitors-  N/A SGLT-2 instructions:  Do not take after     Blood Thinner Instructions: N/A Last dose:   Time: Aspirin  Instructions: ASA 81, hold 5-7 days Last Dose:  Activity level: Can go up a flight of stairs and perform activities of daily living without stopping and without symptoms of chest pain or shortness of breath. Has cane and walker to use PRN.   Anesthesia review: HTN, aortic atherosclerosis, hepatic steatosis, DM2, OSA, hepatocellular carcinoma  Patient denies shortness of breath, fever, cough and chest pain at PAT appointment  Patient verbalized understanding of instructions that were given to them at the PAT appointment. Patient was also instructed that they will need to review over the PAT instructions again at home before surgery.

## 2024-07-03 ENCOUNTER — Other Ambulatory Visit (HOSPITAL_COMMUNITY): Payer: Self-pay | Admitting: Radiology

## 2024-07-03 DIAGNOSIS — C50911 Malignant neoplasm of unspecified site of right female breast: Secondary | ICD-10-CM

## 2024-07-04 ENCOUNTER — Encounter (HOSPITAL_COMMUNITY)
Admission: RE | Admit: 2024-07-04 | Discharge: 2024-07-04 | Disposition: A | Discharge status: Home / Self Care | Source: Ambulatory Visit | Attending: Interventional Radiology | Admitting: Interventional Radiology

## 2024-07-04 ENCOUNTER — Other Ambulatory Visit (HOSPITAL_COMMUNITY): Payer: Self-pay | Admitting: Interventional Radiology

## 2024-07-04 ENCOUNTER — Ambulatory Visit (HOSPITAL_COMMUNITY)
Admission: RE | Admit: 2024-07-04 | Discharge: 2024-07-04 | Disposition: A | Discharge status: Home / Self Care | Source: Ambulatory Visit | Attending: Interventional Radiology | Admitting: Interventional Radiology

## 2024-07-04 ENCOUNTER — Encounter (HOSPITAL_COMMUNITY): Payer: Self-pay

## 2024-07-04 ENCOUNTER — Other Ambulatory Visit: Payer: Self-pay

## 2024-07-04 DIAGNOSIS — C787 Secondary malignant neoplasm of liver and intrahepatic bile duct: Secondary | ICD-10-CM

## 2024-07-04 DIAGNOSIS — C50911 Malignant neoplasm of unspecified site of right female breast: Secondary | ICD-10-CM

## 2024-07-04 HISTORY — PX: IR ANGIOGRAM SELECTIVE EACH ADDITIONAL VESSEL: IMG667

## 2024-07-04 HISTORY — PX: IR US GUIDE VASC ACCESS LEFT: IMG2389

## 2024-07-04 HISTORY — PX: IR EMBO ARTERIAL NOT HEMORR HEMANG INC GUIDE ROADMAPPING: IMG5448

## 2024-07-04 HISTORY — PX: IR 3D INDEPENDENT WKST: IMG2385

## 2024-07-04 HISTORY — PX: IR ANGIOGRAM VISCERAL SELECTIVE: IMG657

## 2024-07-04 LAB — CBC WITH DIFFERENTIAL/PLATELET
Abs Immature Granulocytes: 0.04 K/uL (ref 0.00–0.07)
Basophils Absolute: 0 K/uL (ref 0.0–0.1)
Basophils Relative: 0 %
Eosinophils Absolute: 0.1 K/uL (ref 0.0–0.5)
Eosinophils Relative: 2 %
HCT: 39.4 % (ref 36.0–46.0)
Hemoglobin: 12.7 g/dL (ref 12.0–15.0)
Immature Granulocytes: 1 %
Lymphocytes Relative: 14 %
Lymphs Abs: 0.8 K/uL (ref 0.7–4.0)
MCH: 29.9 pg (ref 26.0–34.0)
MCHC: 32.2 g/dL (ref 30.0–36.0)
MCV: 92.7 fL (ref 80.0–100.0)
Monocytes Absolute: 0.5 K/uL (ref 0.1–1.0)
Monocytes Relative: 9 %
Neutro Abs: 3.9 K/uL (ref 1.7–7.7)
Neutrophils Relative %: 74 %
Platelets: 160 K/uL (ref 150–400)
RBC: 4.25 MIL/uL (ref 3.87–5.11)
RDW: 14 % (ref 11.5–15.5)
WBC: 5.3 K/uL (ref 4.0–10.5)
nRBC: 0 % (ref 0.0–0.2)

## 2024-07-04 LAB — COMPREHENSIVE METABOLIC PANEL WITH GFR
ALT: 17 U/L (ref 0–44)
AST: 14 U/L — ABNORMAL LOW (ref 15–41)
Albumin: 4 g/dL (ref 3.5–5.0)
Alkaline Phosphatase: 77 U/L (ref 38–126)
Anion gap: 10 (ref 5–15)
BUN: 12 mg/dL (ref 8–23)
CO2: 25 mmol/L (ref 22–32)
Calcium: 9.4 mg/dL (ref 8.9–10.3)
Chloride: 102 mmol/L (ref 98–111)
Creatinine, Ser: 0.67 mg/dL (ref 0.44–1.00)
GFR, Estimated: >60 mL/min
Glucose, Bld: 275 mg/dL — ABNORMAL HIGH (ref 70–99)
Potassium: 4.2 mmol/L (ref 3.5–5.1)
Sodium: 138 mmol/L (ref 135–145)
Total Bilirubin: 0.5 mg/dL (ref 0.0–1.2)
Total Protein: 6.9 g/dL (ref 6.5–8.1)

## 2024-07-04 LAB — GLUCOSE, CAPILLARY
Glucose-Capillary: 256 mg/dL — ABNORMAL HIGH (ref 70–99)
Glucose-Capillary: 210 mg/dL — ABNORMAL HIGH (ref 70–99)

## 2024-07-04 LAB — PROTIME-INR
INR: 1 (ref 0.8–1.2)
Prothrombin Time: 14.2 s (ref 11.4–15.2)

## 2024-07-04 MED ORDER — FENTANYL CITRATE (PF) 100 MCG/2ML IJ SOLN
INTRAMUSCULAR | Status: AC | PRN
  Administered 2024-07-04 (×2): 50 ug via INTRAVENOUS

## 2024-07-04 MED ORDER — LIDOCAINE HCL (PF) 1 % IJ SOLN
10.0000 mL | Freq: Once | INTRAMUSCULAR | Status: AC
  Administered 2024-07-04: 5 mL

## 2024-07-04 MED ORDER — TECHNETIUM TO 99M ALBUMIN AGGREGATED
4.4000 | Freq: Once | INTRAVENOUS | Status: AC
  Administered 2024-07-04: 4.4 via INTRAVENOUS

## 2024-07-04 MED ORDER — SODIUM CHLORIDE 0.9 % IV SOLN
INTRAVENOUS | Status: AC
  Filled 2024-07-04: qty 2

## 2024-07-04 MED ORDER — SODIUM CHLORIDE 0.9 % IV SOLN
2.0000 g | Freq: Once | INTRAVENOUS | Status: AC
  Administered 2024-07-04: 2 g via INTRAVENOUS

## 2024-07-04 MED ORDER — VERAPAMIL HCL 2.5 MG/ML IV SOLN
INTRAVENOUS | Status: AC
  Filled 2024-07-04: qty 2

## 2024-07-04 MED ORDER — MIDAZOLAM HCL (PF) 2 MG/2ML IJ SOLN
INTRAMUSCULAR | Status: AC | PRN
  Administered 2024-07-04 (×2): 1 mg via INTRAVENOUS

## 2024-07-04 MED ORDER — NITROGLYCERIN IN D5W 100-5 MCG/ML-% IV SOLN
INTRAVENOUS | Status: AC
  Filled 2024-07-04: qty 250

## 2024-07-04 MED ORDER — HEPARIN SODIUM (PORCINE) 1000 UNIT/ML IJ SOLN
INTRAMUSCULAR | Status: AC
  Filled 2024-07-04: qty 10

## 2024-07-04 MED ORDER — SODIUM CHLORIDE 0.9 % IV SOLN: INTRAVENOUS | Status: DC

## 2024-07-04 MED ORDER — FENTANYL CITRATE (PF) 100 MCG/2ML IJ SOLN
INTRAMUSCULAR | Status: AC
  Filled 2024-07-04: qty 2

## 2024-07-04 MED ORDER — NITROGLYCERIN 2 % TD OINT
TOPICAL_OINTMENT | TRANSDERMAL | Status: AC
  Filled 2024-07-04: qty 1

## 2024-07-04 MED ORDER — IOHEXOL 300 MG/ML  SOLN
250.0000 mL | Freq: Once | INTRAMUSCULAR | Status: AC | PRN
  Administered 2024-07-04: 100 mL

## 2024-07-04 MED ORDER — LIDOCAINE HCL (PF) 1 % IJ SOLN
INTRAMUSCULAR | Status: AC
  Filled 2024-07-04: qty 30

## 2024-07-04 MED ORDER — MIDAZOLAM HCL 2 MG/2ML IJ SOLN
INTRAMUSCULAR | Status: AC
  Filled 2024-07-04: qty 2

## 2024-07-04 NOTE — H&P (Signed)
 "   Chief Complaint: Liver Lesion  Referring Provider(s): Suttle,Dylan J  Supervising Physician: Jennefer Rover  Patient Status: [facility] - Out-pt  History of Present Illness: [name] is a 79 y.o. female with past medical history significant for DM 2, DVT, HTN, TIA, PVD, BCA (S/P lumpectomy and chemoradiation, RUQ restricted), uterine cancer (s/p hysterectomy 2020), and new liver lesion biopsied in IR on 05/14/2024.  Her care also required ERCP when she experienced biliary obstruction (resolved with endoscopic stenting).  She was referred to IR to discuss treatment options for liver lesion.  She was seen by Dr. Jennefer in clinic on 06/12/2024.  Plan made for radiation segmentectomy approach with Y90.  She presents today for pre-Y 90 mapping.  Confirms NPO since MN and ride/supervision available for 24 hours.  Does not wear CPAP or use supplemental home O2.   Denies fever, chills, SOB, CP, sore throat, blood in stool or urine, abnormal bruising, leg swelling, back pain. She endorses BLE edema (baseline), LUQ abd soreness, and nausea that she associates with her medications.   Allergies Reviewed:  Codeine , Fluorescein , Oxycodone , Lipitor [atorvastatin  calcium ], Sitagliptin phosphate, and Sulfa drugs cross reactors   Patient is Full Code  Past Medical History:  Diagnosis Date   Breast cancer (HCC)    Cancer (HCC) 02/2016   right breast   DIABETES MELLITUS, TYPE II 01/04/2007   only takes actoplus daily   Dizziness and giddiness 02/29/2008   DVT, HX OF    at age 57 in right buttocks   Dyspnea    due to lung cancer   Family history of breast cancer    GERD 01/04/2007   pt reports resolved    GLAUCOMA 07/30/2008   both eyes   History of blood transfusion    no abnormal  reaction   History of uterine cancer 2000   hysterectomy done   HYPERLIPIDEMIA 01/04/2007   taking Pravastatin  daily   HYPERTENSION 01/04/2007   takes Lisinopril  daily   Joint pain    Joint swelling     Leg cramps    LEG PAIN, LEFT 07/06/2007   NUMBNESS 07/30/2008   in fingers;pt states from Diamox    OSTEOARTHRITIS, HIP 09/25/2009   OTITIS MEDIA, ACUTE, BILATERAL 02/29/2008   Overweight(278.02) 01/04/2007   Peripheral vascular disease    Personal history of radiation therapy 2018   Pneumonia    PONV (postoperative nausea and vomiting)    SLEEP APNEA, OBSTRUCTIVE    doesn't use a cpap;study done about 67yrs ago   TIA (transient ischemic attack)    TRANSIENT ISCHEMIC ATTACK, HX OF 01/04/2007   Vision loss    left eye    Past Surgical History:  Procedure Laterality Date   ABDOMINAL HYSTERECTOMY  2000   BILIARY STENT PLACEMENT N/A 05/17/2024   Procedure: INSERTION, STENT, BILE DUCT;  Surgeon: Wilhelmenia Aloha Raddle., MD;  Location: [facility];  Service: Gastroenterology;  Laterality: N/A;   BIOPSY OF SKIN SUBCUTANEOUS TISSUE AND/OR MUCOUS MEMBRANE  05/17/2024   Procedure: BIOPSY, SKIN, SUBCUTANEOUS TISSUE, OR MUCOUS MEMBRANE;  Surgeon: Mansouraty, Aloha Raddle., MD;  Location: [facility];  Service: Gastroenterology;;   BREAST LUMPECTOMY Right 01/13/2017   x2   BREAST LUMPECTOMY WITH RADIOACTIVE SEED AND SENTINEL LYMPH NODE BIOPSY Right 01/13/2017   Procedure: RIGHT BREAST RADIOACTIVE SEED X'S 2 GUIDED LUMPECTOMY WITH RADIOACTIVE SEED TARGETED AXILLARYLYMPH NODE EXCISION AND RIGHT AXILLARY SENTINEL LYMPH NODE BIOPSY;  Surgeon: Ethyl Lenis, MD;  Location: [facility];  Service: General;  Laterality: Right;  2  SEEDS IN RIGHT BREAST 1 SEED IN RIGHT AXILLARY NODE   CHOLECYSTECTOMY     ENDOBRONCHIAL ULTRASOUND Bilateral 03/28/2016   Procedure: ENDOBRONCHIAL ULTRASOUND;  Surgeon: Lamar GORMAN Chris, MD;  Location: [facility];  Service: Cardiopulmonary;  Laterality: Bilateral;   ERCP N/A 05/17/2024   Procedure: ERCP, WITH INTERVENTION IF INDICATED;  Surgeon: Wilhelmenia Aloha Raddle., MD;  Location: [facility];  Service: Gastroenterology;  Laterality: N/A;   EYE SURGERY  13   shunt left and lazer eye  surgery on right cataract and retenia tear with repair   growth removal  2004   from thumb   IR RADIOLOGIST EVAL & MGMT  06/12/2024   KNEE ARTHROSCOPY Right    mulitple eye surgeries     both eyes, cataracts with ioc done both eyes   OOPHORECTOMY     right lumpectomy with axillary node dissection Right 01/2017   SPHINCTEROTOMY  05/17/2024   Procedure: SPHINCTEROTOMY, BILIARY;  Surgeon: Wilhelmenia Aloha Raddle., MD;  Location: [facility];  Service: Gastroenterology;;   STONE EXTRACTION WITH BASKET  05/17/2024   Procedure: ERCP, WITH LITHROTRIPSY OR REMOVAL OF COMMON BILE DUCT CALCULUS USING BALLOON;  Surgeon: Wilhelmenia Aloha Raddle., MD;  Location: [facility];  Service: Gastroenterology;;   TOTAL HIP ARTHROPLASTY  06/24/2011   Procedure: TOTAL HIP ARTHROPLASTY;  Surgeon: Dempsey JINNY Sensor;  Location: [facility];  Service: Orthopedics;  Laterality: Right;   TOTAL HIP ARTHROPLASTY Left 11/12/2012   Dr Sensor   TOTAL HIP ARTHROPLASTY Left 11/12/2012   Procedure: TOTAL HIP ARTHROPLASTY;  Surgeon: Dempsey JINNY Sensor, MD;  Location: [facility];  Service: Orthopedics;  Laterality: Left;  DEPUY PINNACLE      Medications: Prior to Admission medications  Medication Sig Start Date End Date Taking? Authorizing Provider  aspirin  EC 81 MG tablet Take 81 mg by mouth daily at 6 PM. 1700    [provider]  brimonidine  (ALPHAGAN ) 0.2 % ophthalmic solution INSTILL 1 DROP INTO EACH EYE THREE TIMES DAILY 11/23/20   [provider]  cholecalciferol (VITAMIN D) 1000 units tablet Take 1,000 Units by mouth daily.    [provider]  dorzolamide -timolol  (COSOPT ) 22.3-6.8 MG/ML ophthalmic solution Place 1 drop into both eyes 2 (two) times daily. 12/20/16   [provider]  gabapentin  (NEURONTIN ) 100 MG capsule Take 200 mg at bedtime, can increase to 300 mg at bedtime if tolerated Patient not taking: Reported on 07/01/2024 12/13/22   Geofm Glade JINNY, MD  ketorolac  (ACULAR ) 0.5 % ophthalmic solution Place  1 drop into the right eye every morning. 11/29/22   [provider]  lisinopril  (ZESTRIL ) 5 MG tablet Take 1 tablet by mouth once daily 06/14/24   Rollene Almarie LABOR, MD  lovastatin  (MEVACOR ) 20 MG tablet TAKE 1 TABLET BY MOUTH AT BEDTIME 01/22/24   Rollene Almarie LABOR, MD  Netarsudil -Latanoprost  (ROCKLATAN ) 0.02-0.005 % SOLN Place 1 drop into both eyes at bedtime. 06/30/20   Magrinat, Sandria BROCKS, MD  ondansetron  (ZOFRAN -ODT) 4 MG disintegrating tablet Take 1 tablet (4 mg total) by mouth every 8 (eight) hours as needed for nausea or vomiting. 04/24/24   Iruku, Praveena, MD  pantoprazole  (PROTONIX ) 40 MG tablet Take 1 tablet (40 mg total) by mouth daily. Patient not taking: Reported on 06/20/2024 02/02/24   Abran Norleen SAILOR, MD  pioglitazone -metformin  (ACTOPLUS MET ) 15-850 MG tablet Take 1 tablet by mouth daily. 09/21/23   Rollene Almarie LABOR, MD  prednisoLONE  acetate (PRED FORTE ) 1 % ophthalmic suspension Place 1 drop into both eyes as directed.  INSTILL ONE DROP INTO BOTH EYES AS DIRECTED TWICE A DAY INTO RIGHT EYE FOUR TIMES A DAY IN LEFT EYE 01/16/20   [provider]     Family History  Problem Relation Age of Onset   Breast cancer Mother 108   Dementia Mother    Cancer Mother        Breast and lung cancer   Stroke Sister    Breast cancer Sister 58   Heart attack Maternal Aunt    Lung cancer Maternal Grandmother        non smoker   Glaucoma Maternal Grandfather    Anesthesia problems Neg Hx    Colon cancer Neg Hx    Esophageal cancer Neg Hx    Rectal cancer Neg Hx    Stomach cancer Neg Hx     Social History   Socioeconomic History   Marital status: Married    Spouse name: Debby   Number of children: Not on file   Years of education: Not on file   Highest education level: Not on file  Occupational History   Occupation: physiological scientist    Employer: [employer]   Occupation: retired  Tobacco Use   Smoking status: Never   Smokeless tobacco: Never   Vaping Use   Vaping status: Never Used  Substance and Sexual Activity   Alcohol use: No   Drug use: No   Sexual activity: Not Currently  Other Topics Concern   Not on file  Social History Narrative   Lives with husband/2025   Social Drivers of Health   Tobacco Use: Low Risk (07/01/2024)   Patient History    Smoking Tobacco Use: Never    Smokeless Tobacco Use: Never    Passive Exposure: Not on file  Financial Resource Strain: Low Risk (02/06/2023)   Overall Financial Resource Strain (CARDIA)    Difficulty of Paying Living Expenses: Not hard at all  Food Insecurity: No Food Insecurity (05/16/2024)   Epic    Worried About Radiation Protection Practitioner of Food in the Last Year: Never true    Ran Out of Food in the Last Year: Never true  Transportation Needs: No Transportation Needs (05/16/2024)   Epic    Lack of Transportation (Medical): No    Lack of Transportation (Non-Medical): No  Physical Activity: Inactive (05/02/2024)   Exercise Vital Sign    Days of Exercise per Week: 0 days    Minutes of Exercise per Session: 0 min  Stress: No Stress Concern Present (05/02/2024)   [employer] of Occupational Health - Occupational Stress Questionnaire    Feeling of Stress: Only a little  Social Connections: Socially Integrated (05/16/2024)   Social Connection and Isolation Panel    Frequency of Communication with Friends and Family: More than three times a week    Frequency of Social Gatherings with Friends and Family: Three times a week    Attends Religious Services: More than 4 times per year    Active Member of Clubs or Organizations: Yes    Attends Banker Meetings: Never    Marital Status: Married  Depression (PHQ2-9): Low Risk (06/20/2024)   Depression (PHQ2-9)    PHQ-2 Score: 0  Alcohol Screen: Low Risk (02/25/2022)   Alcohol Screen    Last Alcohol Screening Score (AUDIT): 0  Housing: Low Risk (05/16/2024)   Epic    Unable to Pay for Housing in the Last Year: No    Number of  Times Moved in the Last Year: 0  Homeless in the Last Year: No  Utilities: Not At Risk (05/16/2024)   Epic    Threatened with loss of utilities: No  Health Literacy: Adequate Health Literacy (05/02/2024)   B1300 Health Literacy    Frequency of need for help with medical instructions: Never     Review of Systems: A 12 point ROS discussed and pertinent positives are indicated in the HPI above.  All other systems are negative.    Vital Signs: BP (!) 179/80 (BP Location: Left Arm)   Pulse 86   Temp 98.2 F (36.8 C) (Oral)   Resp 16   SpO2 93%     Physical Exam Constitutional:      Appearance: She is obese.  HENT:     Head: Normocephalic.     Mouth/Throat:     Mouth: Mucous membranes are moist.     Pharynx: Oropharynx is clear.  Cardiovascular:     Rate and Rhythm: Normal rate and regular rhythm.     Pulses: Normal pulses.     Heart sounds: Normal heart sounds.  Pulmonary:     Effort: Pulmonary effort is normal.     Breath sounds: Normal breath sounds.  Abdominal:     General: There is no distension.     Palpations: Abdomen is soft.     Comments: Mild LUQ tenderness  Musculoskeletal:     Right lower leg: Edema present.     Left lower leg: Edema present.     Comments: Nonpitting  Skin:    General: Skin is warm and dry.     Coloration: Skin is not jaundiced.     Comments: Erythema left groin, appears moisture associated  Neurological:     Mental Status: She is alert and oriented to person, place, and time.  Psychiatric:        Mood and Affect: Mood normal.        Behavior: Behavior normal.        Thought Content: Thought content normal.        Judgment: Judgment normal.     Imaging: IR Radiologist Eval & Mgmt Result Date: 06/12/2024 EXAM: NEW PATIENT OFFICE VISIT CHIEF COMPLAINT: See Epic note. HISTORY OF PRESENT ILLNESS: See Epic note. REVIEW OF SYSTEMS: See Epic note. PHYSICAL EXAMINATION: See Epic note. ASSESSMENT AND PLAN: See Epic note. Ester Sides, MD  [facility] Rad[facility] Electronically Signed   By: Ester Sides M.D.   On: 06/12/2024 14:40    Labs:  CBC: Recent Labs    05/17/24 0458 05/18/24 0528 05/23/24 1435 06/20/24 1151  WBC 2.4* 2.7* 2.0* 7.9  HGB 11.3* 11.0* 12.6 13.1  HCT 34.7* 33.3* 36.6 37.9  PLT 116* 121* 142* 216    COAGS: Recent Labs    05/14/24 1115 05/17/24 0458  INR 1.1 1.2    BMP: Recent Labs    05/17/24 0458 05/18/24 0528 05/23/24 1435 06/20/24 1151  NA 139 138 138 136  K 3.5 3.7 4.2 4.4  CL 105 104 103 100  CO2 23 24 24 26   GLUCOSE 152* 223* 223* 278*  BUN 9 12 6* 16  CALCIUM  8.7* 8.7* 9.2 9.2  CREATININE 0.54 0.66 0.63 0.72  GFRNONAA >60 >60 >60 >60    LIVER FUNCTION TESTS: Recent Labs    05/18/24 0528 05/23/24 1435 05/29/24 1026 06/20/24 1151  BILITOT 2.0* 1.0 0.9 0.5  AST 194* 31 20 21   ALT 636* 155* 51* 22  ALKPHOS 126 89 66 76  PROT 5.7*  6.8 6.5 7.2  ALBUMIN  3.6 4.2 4.1 4.2    TUMOR MARKERS: No results for input(s): AFPTM, CEA, CA199, CHROMGRNA in the last 8760 hours.  Assessment and Plan:  Patient presents for Y90 mapping (hepatic angiogram and Tc-MAA mapping with moderate sedation) to be followed in 1 to 2 weeks by Y90 radiation segmentectomy.  No contraindications for procedure identified in ROS, physical exam, or review of pre-sedation considerations. 1/22 Labs pending at time of note; CBC, INR, CMP drawn.  Most recent GFR and INR without concern though.  Today's labs will be reviewed prior to procedure start. BP 179/80, VS otherwise stable, afebrile She has not taken ASA in the last 5 days Previously noted the patient stated that she preferred right femoral approach over wrist.  Patient tells me during exam that after speaking with peer she is interested in discussing radial puncture.  Will be discussed with patient by Dr. Jennefer.  RUE restricted V/T previous lumpectomy.    Risks and benefits discussed with  the patient including, but not limited to bleeding, infection, vascular injury, post procedural pain, nausea, vomiting and fatigue, contrast induced renal failure, liver failure, radiation injury to the bowel, and possible need for additional procedures.  All of the patient's questions were answered, patient is agreeable to proceed. Consent signed and in chart.   Thank you for allowing our service to participate in [name] 's care.    Electronically Signed: Laymon Coast, NP   07/04/2024, 8:05 AM     I spent a total of  15 Minutes   in face to face in clinical consultation, greater than 50% of which was counseling/coordinating care for image guided Y90 mapping procedure.   (A copy of this note was sent to the referring provider and the time of visit.)  "

## 2024-07-04 NOTE — Discharge Instructions (Signed)
 Please call Interventional Radiology clinic [phone] with any questions or concerns.  You may remove your dressing and shower tomorrow.    Hepatic Artery Radioembolization, Care After The following information offers guidance on how to care for yourself after your procedure. Your health care provider may also give you more specific instructions. If you have problems or questions, contact your health care provider. What can I expect after the procedure? After the procedure, it is possible to have: A slight fever for 7 to 10 days. This may be accompanied by pain, nausea, or vomiting, which is referred to as post-embolization syndrome. You may be given medicine to help relieve these symptoms. If your fever gets worse, tell your health care provider. Tiredness (fatigue). Loss of appetite. This should gradually improve after about 1 week. Abdominal pain on your right side. Soreness and tenderness in your groin area where the needle and catheter were placed (puncture site). Follow these instructions at home: Puncture site care Follow instructions from your health care provider about how to take care of the puncture site. Make sure you: Wash your hands with soap and water for at least 20 seconds before and after you change your bandage (dressing). If soap and water are not available, use hand sanitizer. Change your dressing as told by your health care provider. Check your puncture site every day for signs of infection. Check for: More redness, swelling, or pain. Fluid or blood. Warmth. Pus or a bad smell. Activity Rest as told by your health care provider. Return to your normal activities as told by your health care provider. Ask your health care provider what activities are safe for you. Avoid sitting for a long time without moving. Get up to take short walks every 1-2 hours. This is important to improve blood flow and breathing. Ask for help if you feel weak or unsteady. If you were given  a sedative during the procedure, it can affect you for several hours. Do not drive or operate machinery until your health care provider says that it is safe. Do not lift anything that is heavier than 10 lb (4.5 kg), or the limit that you are told, until your health care provider says that it is safe. Medicines Take over-the-counter and prescription medicines only as told by your health care provider. Ask your health care provider if the medicine prescribed to you: Requires you to avoid driving or using machinery. Can cause constipation. You may need to take these actions to prevent or treat constipation: Drink enough fluid to keep your urine pale yellow. Take over-the-counter or prescription medicines. Eat foods that are high in fiber, such as beans, whole grains, and fresh fruits and vegetables. Limit foods that are high in fat and processed sugars, such as fried or sweet foods. General instructions Eat frequent, small meals until your appetite returns. Follow instructions from your health care provider about eating or drinking restrictions. Do not take baths, swim, or use a hot tub until your health care provider approves. You may take showers. Wash your puncture site with mild soap and water, and pat the area dry. Wear compression stockings as told by your health care provider. These stockings help to prevent blood clots and reduce swelling in your legs. Keep all follow-up visits. This is important. You may need to have blood tests and imaging tests. Contact a health care provider if: You have any of these signs of infection: More redness, swelling, or pain around your puncture site. Fluid or blood coming from your  puncture site. Warmth coming from your puncture site. Pus or a bad smell coming from your puncture site. You have pain that: Gets worse. Does not get better with medicine. Feels like very bad heartburn. Is in the middle of your abdomen, above your belly button. You have any  signs of infection or liver failure, such as: Your skin or the white parts of your eyes turn yellow (jaundice). The color of your urine changes to dark brown. The color of your stool (feces) changes to light yellow. Your abdominal measurement (girth) increases in a short period of time. You gain more than 5 lb (2.3 kg) in a short period of time. Get help right away if: You have a fever that lasts more than 10 days or is higher than what your health care provider told you to expect. You develop any of the following in your legs: Pain. Swelling. Skin that is cold or pale or turns blue. You have chest pain. You have blood in your vomit, saliva, or stool. You have trouble breathing. These symptoms may represent a serious problem that is an emergency. Do not wait to see if the symptoms will go away. Get medical help right away. Call your local emergency services (911 in the [place] Do not drive yourself to the hospital. Summary After the procedure, it is possible to have a slight fever for up to 7-10 days, tiredness, loss of appetite, abdominal pain on the right side, and groin tenderness where the catheter was placed. Do not come in close contact with people for up to a week after your procedure, as told by your health care provider. Follow instructions from your health care provider about how to take care of the puncture site. Contact a health care provider if you have any signs of infection. Get help right away if you develop pain or swelling in your legs or if your legs feel cool or look pale. This information is not intended to replace advice given to you by your health care provider. Make sure you discuss any questions you have with your health care provider. Document Revised: 05/03/2020 Document Reviewed: 05/03/2020 Elsevier Patient Education  2022 Elsevier Inc.      Moderate Conscious Sedation, Adult, Care After This sheet gives you information about how to care for yourself after  your procedure. Your health care provider may also give you more specific instructions. If you have problems or questions, contact your health care provider. What can I expect after the procedure? After the procedure, it is common to have: Sleepiness for several hours. Impaired judgment for several hours. Difficulty with balance. Vomiting if you eat too soon. Follow these instructions at home: For the time period you were told by your health care provider: Rest. Do not participate in activities where you could fall or become injured. Do not drive or use machinery. Do not drink alcohol. Do not take sleeping pills or medicines that cause drowsiness. Do not make important decisions or sign legal documents. Do not take care of children on your own.      Eating and drinking Follow the diet recommended by your health care provider. Drink enough fluid to keep your urine pale yellow. If you vomit: Drink water, juice, or soup when you can drink without vomiting. Make sure you have little or no nausea before eating solid foods.   General instructions Take over-the-counter and prescription medicines only as told by your health care provider. Have a responsible adult stay with you for the time  you are told. It is important to have someone help care for you until you are awake and alert. Do not smoke. Keep all follow-up visits as told by your health care provider. This is important. Contact a health care provider if: You are still sleepy or having trouble with balance after 24 hours. You feel light-headed. You keep feeling nauseous or you keep vomiting. You develop a rash. You have a fever. You have redness or swelling around the IV site. Get help right away if: You have trouble breathing. You have new-onset confusion at home. Summary After the procedure, it is common to feel sleepy, have impaired judgment, or feel nauseous if you eat too soon. Rest after you get home. Know the things you  should not do after the procedure. Follow the diet recommended by your health care provider and drink enough fluid to keep your urine pale yellow. Get help right away if you have trouble breathing or new-onset confusion at home. This information is not intended to replace advice given to you by your health care provider. Make sure you discuss any questions you have with your health care provider. Document Revised: 09/27/2019 Document Reviewed: 04/25/2019 Elsevier Patient Education  2021 Elsevier Inc.   AFTER YOUR NEXT PROCEDURE  Radiation precautions For up to a week after your procedure, there will be a small amount of radioactivity near your liver. This is not especially dangerous to other people. However, as told by your health care provider, you should follow these precautions for 7 days: Do not come in close contact with people. Do not hold children or babies. Do not have contact with pregnant women.  Post Y-90 Radioembolization Discharge Instructions  You have been given a radioactive material during your procedure.  While it is safe for you to be discharged home from the hospital, you need to proceed directly home.    Children and pregnant females should not visit or have close contact with you for 1 week.  Items that you touch are not radioactive.  Your blood may be radioactive and caution should be used if any bleeding occurs during the recovery period.  Body fluids may be radioactive for 24 hours.  Wash your hands after voiding.  Men should sit to urinate.  Dispose of any soiled materials (flush down toilet or place in trash at home) during the first day.  Drink 6 to 8 glasses of fluids per day for 5 days to hydrate yourself.  If you need to see a doctor during the first week, you must let them know that you were treated with yttrium-90 microspheres, and will be slightly radioactive.  They can call Interventional Radiology [phone] with any questions.

## 2024-07-04 NOTE — Procedures (Signed)
 Interventional Radiology Procedure Note  Procedure:  Hepatic angiogram with Tc-MAA injection  Findings: Please refer to procedural dictation for full description. Tc-MAA injection via segment 4 hepatic artery. Left radial artery access, 5 Fr, TR band applied with 13 cc at 11:00.  Complications: None immediate  Estimated Blood Loss: <5 mL  Recommendations: TR band release protocol. IR will arrange for 1-2 week follow up for Y90 administration.   Ester Sides, MD

## 2024-07-05 LAB — AFP TUMOR MARKER: AFP, Serum, Tumor Marker: 1.8 ng/mL (ref 0.0–9.2)

## 2024-07-09 ENCOUNTER — Telehealth: Payer: Self-pay

## 2024-07-09 NOTE — Telephone Encounter (Signed)
 Procedure:ERCP Procedure date: 07/15/24 Procedure location: [facility] Arrival Time: 6:36 Spoke with the patient Y/N: Y Any prep concerns? N Has the patient obtained the prep from the pharmacy ? N Do you have a care partner and transportation: Y Any additional concerns? N

## 2024-07-15 ENCOUNTER — Encounter (HOSPITAL_COMMUNITY)
Admission: RE | Disposition: A | Discharge status: Home / Self Care | Payer: Self-pay | Source: Home / Self Care | Attending: Gastroenterology

## 2024-07-15 ENCOUNTER — Encounter (HOSPITAL_COMMUNITY): Payer: Self-pay | Admitting: Gastroenterology

## 2024-07-15 ENCOUNTER — Ambulatory Visit (HOSPITAL_COMMUNITY)
Admission: RE | Admit: 2024-07-15 | Discharge: 2024-07-15 | Disposition: A | Discharge status: Home / Self Care | Attending: Gastroenterology | Admitting: Gastroenterology

## 2024-07-15 ENCOUNTER — Ambulatory Visit (HOSPITAL_COMMUNITY): Admitting: Anesthesiology

## 2024-07-15 ENCOUNTER — Ambulatory Visit (HOSPITAL_COMMUNITY)

## 2024-07-15 ENCOUNTER — Other Ambulatory Visit: Payer: Self-pay

## 2024-07-15 DIAGNOSIS — Z4659 Encounter for fitting and adjustment of other gastrointestinal appliance and device: Secondary | ICD-10-CM

## 2024-07-15 DIAGNOSIS — E119 Type 2 diabetes mellitus without complications: Secondary | ICD-10-CM | POA: Diagnosis not present

## 2024-07-15 DIAGNOSIS — G4730 Sleep apnea, unspecified: Secondary | ICD-10-CM

## 2024-07-15 DIAGNOSIS — K838 Other specified diseases of biliary tract: Secondary | ICD-10-CM | POA: Insufficient documentation

## 2024-07-15 DIAGNOSIS — Z8673 Personal history of transient ischemic attack (TIA), and cerebral infarction without residual deficits: Secondary | ICD-10-CM | POA: Insufficient documentation

## 2024-07-15 DIAGNOSIS — K8040 Calculus of bile duct with cholecystitis, unspecified, without obstruction: Secondary | ICD-10-CM

## 2024-07-15 DIAGNOSIS — K8050 Calculus of bile duct without cholangitis or cholecystitis without obstruction: Secondary | ICD-10-CM | POA: Diagnosis not present

## 2024-07-15 DIAGNOSIS — I10 Essential (primary) hypertension: Secondary | ICD-10-CM

## 2024-07-15 DIAGNOSIS — F32A Depression, unspecified: Secondary | ICD-10-CM | POA: Insufficient documentation

## 2024-07-15 DIAGNOSIS — Z7984 Long term (current) use of oral hypoglycemic drugs: Secondary | ICD-10-CM | POA: Insufficient documentation

## 2024-07-15 DIAGNOSIS — F419 Anxiety disorder, unspecified: Secondary | ICD-10-CM | POA: Insufficient documentation

## 2024-07-15 DIAGNOSIS — E1151 Type 2 diabetes mellitus with diabetic peripheral angiopathy without gangrene: Secondary | ICD-10-CM | POA: Insufficient documentation

## 2024-07-15 DIAGNOSIS — G4733 Obstructive sleep apnea (adult) (pediatric): Secondary | ICD-10-CM | POA: Insufficient documentation

## 2024-07-15 DIAGNOSIS — Z86718 Personal history of other venous thrombosis and embolism: Secondary | ICD-10-CM | POA: Insufficient documentation

## 2024-07-15 DIAGNOSIS — Z4689 Encounter for fitting and adjustment of other specified devices: Secondary | ICD-10-CM

## 2024-07-15 HISTORY — DX: Transient cerebral ischemic attack, unspecified: G45.9

## 2024-07-15 LAB — GLUCOSE, CAPILLARY
Glucose-Capillary: 269 mg/dL — ABNORMAL HIGH (ref 70–99)
Glucose-Capillary: 256 mg/dL — ABNORMAL HIGH (ref 70–99)

## 2024-07-15 MED ORDER — LIDOCAINE 2% (20 MG/ML) 5 ML SYRINGE
INTRAMUSCULAR | Status: DC | PRN
  Administered 2024-07-15: 100 mg via INTRAVENOUS

## 2024-07-15 MED ORDER — PHENYLEPHRINE 80 MCG/ML (10ML) SYRINGE FOR IV PUSH (FOR BLOOD PRESSURE SUPPORT)
PREFILLED_SYRINGE | INTRAVENOUS | Status: DC | PRN
  Administered 2024-07-15: 80 ug via INTRAVENOUS

## 2024-07-15 MED ORDER — LIDOCAINE HCL (CARDIAC) PF 100 MG/5ML IV SOSY: PREFILLED_SYRINGE | INTRAVENOUS | Status: DC | PRN

## 2024-07-15 MED ORDER — PROPOFOL 500 MG/50ML IV EMUL
INTRAVENOUS | Status: DC | PRN
  Administered 2024-07-15: 150 ug/kg/min via INTRAVENOUS

## 2024-07-15 MED ORDER — DICLOFENAC SUPPOSITORY 100 MG
RECTAL | Status: AC
  Filled 2024-07-15: qty 1

## 2024-07-15 MED ORDER — HYDRALAZINE HCL 20 MG/ML IJ SOLN
10.0000 mg | Freq: Once | INTRAMUSCULAR | Status: AC
  Administered 2024-07-15: 10 mg via INTRAVENOUS

## 2024-07-15 MED ORDER — SODIUM CHLORIDE 0.9 % IV SOLN: INTRAVENOUS | Status: DC

## 2024-07-15 MED ORDER — ROCURONIUM BROMIDE 100 MG/10ML IV SOLN
INTRAVENOUS | Status: DC | PRN
  Administered 2024-07-15: 50 mg via INTRAVENOUS

## 2024-07-15 MED ORDER — DEXAMETHASONE SOD PHOSPHATE PF 10 MG/ML IJ SOLN
INTRAMUSCULAR | Status: DC | PRN
  Administered 2024-07-15: 10 mg via INTRAVENOUS

## 2024-07-15 MED ORDER — FENTANYL CITRATE (PF) 100 MCG/2ML IJ SOLN
INTRAMUSCULAR | Status: AC
  Filled 2024-07-15: qty 2

## 2024-07-15 MED ORDER — PROPOFOL 10 MG/ML IV BOLUS
INTRAVENOUS | Status: DC | PRN
  Administered 2024-07-15: 150 mg via INTRAVENOUS
  Administered 2024-07-15: 30 mg via INTRAVENOUS

## 2024-07-15 MED ORDER — DICLOFENAC SUPPOSITORY 100 MG
RECTAL | Status: DC | PRN
  Administered 2024-07-15: 100 mg via RECTAL

## 2024-07-15 MED ORDER — GLUCAGON HCL RDNA (DIAGNOSTIC) 1 MG IJ SOLR
INTRAMUSCULAR | Status: AC
  Filled 2024-07-15: qty 1

## 2024-07-15 MED ORDER — HYDRALAZINE HCL 20 MG/ML IJ SOLN
INTRAMUSCULAR | Status: AC
  Filled 2024-07-15: qty 1

## 2024-07-15 MED ORDER — INSULIN REGULAR HUMAN 100 UNIT/ML IJ SOLN
INTRAMUSCULAR | Status: DC | PRN
  Administered 2024-07-15: 4 [IU] via SUBCUTANEOUS

## 2024-07-15 MED ORDER — AMISULPRIDE (ANTIEMETIC) 5 MG/2ML IV SOLN
INTRAVENOUS | Status: DC | PRN
  Administered 2024-07-15: 10 mg via INTRAVENOUS

## 2024-07-15 MED ORDER — INSULIN ASPART 100 UNIT/ML IJ SOLN
INTRAMUSCULAR | Status: AC
  Filled 2024-07-15: qty 4

## 2024-07-15 MED ORDER — AMISULPRIDE (ANTIEMETIC) 5 MG/2ML IV SOLN
INTRAVENOUS | Status: AC
  Filled 2024-07-15: qty 4

## 2024-07-15 MED ORDER — FENTANYL CITRATE (PF) 100 MCG/2ML IJ SOLN
INTRAMUSCULAR | Status: DC | PRN
  Administered 2024-07-15: 25 ug via INTRAVENOUS
  Administered 2024-07-15: 50 ug via INTRAVENOUS

## 2024-07-15 MED ORDER — SUGAMMADEX SODIUM 200 MG/2ML IV SOLN
INTRAVENOUS | Status: DC | PRN
  Administered 2024-07-15: 200 mg via INTRAVENOUS

## 2024-07-15 MED ORDER — CIPROFLOXACIN IN D5W 400 MG/200ML IV SOLN
INTRAVENOUS | Status: AC
  Filled 2024-07-15: qty 200

## 2024-07-15 MED ORDER — ONDANSETRON HCL 4 MG/2ML IJ SOLN
INTRAMUSCULAR | Status: DC | PRN
  Administered 2024-07-15: 4 mg via INTRAVENOUS

## 2024-07-15 MED ORDER — SODIUM CHLORIDE 0.9 % IV SOLN
INTRAVENOUS | Status: DC | PRN
  Administered 2024-07-15: 50 mL

## 2024-07-15 MED ORDER — CIPROFLOXACIN IN D5W 400 MG/200ML IV SOLN
INTRAVENOUS | Status: DC | PRN
  Administered 2024-07-15: 400 mg via INTRAVENOUS

## 2024-07-15 MED ORDER — PROPOFOL 1000 MG/100ML IV EMUL
INTRAVENOUS | Status: AC
  Filled 2024-07-15: qty 100

## 2024-07-15 NOTE — Transfer of Care (Signed)
 Immediate Anesthesia Transfer of Care Note  Patient: [name]  Procedure(s) Performed: ERCP, WITH INTERVENTION IF INDICATED DILATION, STRICTURE, BILE DUCT ERCP, WITH STENT REMOVAL ERCP, WITH COMMON BILE DUCT CALCULUS LITHROTRIPSY OR REMOVAL USING BALLOON  Patient Location: PACU  Anesthesia Type:General  Level of Consciousness: awake, alert , and oriented  Airway & Oxygen  Therapy: Patient Spontanous Breathing and Patient connected to face mask oxygen   Post-op Assessment: Report given to RN, Post -op Vital signs reviewed and stable, and Patient moving all extremities X 4  Post vital signs: Reviewed and stable  Last Vitals:  Vitals Value Taken Time  BP 157/64 07/15/24 12:11  Temp    Pulse 98   Resp 13 07/15/24 12:13  SpO2 98   Vitals shown include unfiled device data.  Last Pain:  Vitals:   07/15/24 0955  TempSrc: Temporal  PainSc: 0-No pain         Complications: No notable events documented.

## 2024-07-15 NOTE — Discharge Instructions (Signed)

## 2024-07-15 NOTE — Anesthesia Postprocedure Evaluation (Signed)
"   Anesthesia Post Note  Patient: [name]  Procedure(s) Performed: ERCP, WITH INTERVENTION IF INDICATED DILATION, STRICTURE, BILE DUCT ERCP, WITH STENT REMOVAL ERCP, WITH COMMON BILE DUCT CALCULUS LITHROTRIPSY OR REMOVAL USING BALLOON     Patient location during evaluation: PACU Anesthesia Type: General Level of consciousness: awake and alert Pain management: pain level controlled Vital Signs Assessment: post-procedure vital signs reviewed and stable Respiratory status: spontaneous breathing, nonlabored ventilation, respiratory function stable and patient connected to nasal cannula oxygen  Cardiovascular status: blood pressure returned to baseline and stable Postop Assessment: no apparent nausea or vomiting Anesthetic complications: no   No notable events documented.  Last Vitals:  Vitals:   07/15/24 1320 07/15/24 1330  BP: (!) 179/64 (!) 193/79  Pulse: 85 91  Resp: (!) 24 16  Temp:    SpO2: 97% 98%    Last Pain:  Vitals:   07/15/24 1330  TempSrc:   PainSc: 0-No pain                 [name] L [name]      "

## 2024-07-15 NOTE — Anesthesia Procedure Notes (Signed)
 Procedure Name: Intubation Date/Time: 07/15/2024 11:15 AM  Performed by: Deeann Eva BROCKS, CRNAPre-anesthesia Checklist: Patient identified, Emergency Drugs available, Suction available and Patient being monitored Patient Re-evaluated:Patient Re-evaluated prior to induction Oxygen  Delivery Method: Circle System Utilized Preoxygenation: Pre-oxygenation with 100% oxygen  Induction Type: IV induction Ventilation: Mask ventilation without difficulty Laryngoscope Size: Mac and 3 Grade View: Grade II Tube type: Oral Tube size: 7.0 mm Number of attempts: 1 Airway Equipment and Method: Stylet Placement Confirmation: ETT inserted through vocal cords under direct vision, positive ETCO2 and breath sounds checked- equal and bilateral Secured at: 21 cm Tube secured with: Tape Dental Injury: Teeth and Oropharynx as per pre-operative assessment

## 2024-07-16 ENCOUNTER — Other Ambulatory Visit: Payer: Self-pay | Admitting: Radiology

## 2024-07-16 DIAGNOSIS — C50919 Malignant neoplasm of unspecified site of unspecified female breast: Secondary | ICD-10-CM

## 2024-07-17 ENCOUNTER — Other Ambulatory Visit (HOSPITAL_COMMUNITY): Payer: Self-pay | Admitting: Interventional Radiology

## 2024-07-17 ENCOUNTER — Ambulatory Visit (HOSPITAL_COMMUNITY)
Admission: RE | Admit: 2024-07-17 | Discharge: 2024-07-17 | Disposition: A | Discharge status: Home / Self Care | Source: Ambulatory Visit | Attending: Interventional Radiology | Admitting: Interventional Radiology

## 2024-07-17 ENCOUNTER — Encounter (HOSPITAL_COMMUNITY): Admission: RE | Admit: 2024-07-17 | Source: Ambulatory Visit

## 2024-07-17 ENCOUNTER — Encounter (HOSPITAL_COMMUNITY): Payer: Self-pay

## 2024-07-17 ENCOUNTER — Other Ambulatory Visit: Payer: Self-pay

## 2024-07-17 DIAGNOSIS — Z7982 Long term (current) use of aspirin: Secondary | ICD-10-CM | POA: Insufficient documentation

## 2024-07-17 DIAGNOSIS — C787 Secondary malignant neoplasm of liver and intrahepatic bile duct: Secondary | ICD-10-CM

## 2024-07-17 DIAGNOSIS — Z90710 Acquired absence of both cervix and uterus: Secondary | ICD-10-CM | POA: Insufficient documentation

## 2024-07-17 DIAGNOSIS — C50919 Malignant neoplasm of unspecified site of unspecified female breast: Secondary | ICD-10-CM | POA: Insufficient documentation

## 2024-07-17 DIAGNOSIS — Z86718 Personal history of other venous thrombosis and embolism: Secondary | ICD-10-CM | POA: Insufficient documentation

## 2024-07-17 DIAGNOSIS — Z79899 Other long term (current) drug therapy: Secondary | ICD-10-CM | POA: Insufficient documentation

## 2024-07-17 DIAGNOSIS — Z923 Personal history of irradiation: Secondary | ICD-10-CM | POA: Insufficient documentation

## 2024-07-17 DIAGNOSIS — I10 Essential (primary) hypertension: Secondary | ICD-10-CM | POA: Insufficient documentation

## 2024-07-17 DIAGNOSIS — E1151 Type 2 diabetes mellitus with diabetic peripheral angiopathy without gangrene: Secondary | ICD-10-CM | POA: Insufficient documentation

## 2024-07-17 DIAGNOSIS — Z8673 Personal history of transient ischemic attack (TIA), and cerebral infarction without residual deficits: Secondary | ICD-10-CM | POA: Insufficient documentation

## 2024-07-17 DIAGNOSIS — Z9221 Personal history of antineoplastic chemotherapy: Secondary | ICD-10-CM | POA: Insufficient documentation

## 2024-07-17 LAB — CBC WITH DIFFERENTIAL/PLATELET
Abs Immature Granulocytes: 0.05 10*3/uL (ref 0.00–0.07)
Basophils Absolute: 0 10*3/uL (ref 0.0–0.1)
Basophils Relative: 0 %
Eosinophils Absolute: 0.1 10*3/uL (ref 0.0–0.5)
Eosinophils Relative: 2 %
HCT: 40.1 % (ref 36.0–46.0)
Hemoglobin: 12.8 g/dL (ref 12.0–15.0)
Immature Granulocytes: 1 %
Lymphocytes Relative: 18 %
Lymphs Abs: 1 10*3/uL (ref 0.7–4.0)
MCH: 29.2 pg (ref 26.0–34.0)
MCHC: 31.9 g/dL (ref 30.0–36.0)
MCV: 91.6 fL (ref 80.0–100.0)
Monocytes Absolute: 0.5 10*3/uL (ref 0.1–1.0)
Monocytes Relative: 10 %
Neutro Abs: 3.9 10*3/uL (ref 1.7–7.7)
Neutrophils Relative %: 69 %
Platelets: 193 10*3/uL (ref 150–400)
RBC: 4.38 MIL/uL (ref 3.87–5.11)
RDW: 14.5 % (ref 11.5–15.5)
WBC: 5.6 10*3/uL (ref 4.0–10.5)
nRBC: 0 % (ref 0.0–0.2)

## 2024-07-17 LAB — COMPREHENSIVE METABOLIC PANEL WITH GFR
ALT: 18 U/L (ref 0–44)
AST: 17 U/L (ref 15–41)
Albumin: 4 g/dL (ref 3.5–5.0)
Alkaline Phosphatase: 87 U/L (ref 38–126)
Anion gap: 10 (ref 5–15)
BUN: 20 mg/dL (ref 8–23)
CO2: 25 mmol/L (ref 22–32)
Calcium: 9.4 mg/dL (ref 8.9–10.3)
Chloride: 102 mmol/L (ref 98–111)
Creatinine, Ser: 0.79 mg/dL (ref 0.44–1.00)
GFR, Estimated: >60 mL/min
Glucose, Bld: 318 mg/dL — ABNORMAL HIGH (ref 70–99)
Potassium: 4 mmol/L (ref 3.5–5.1)
Sodium: 137 mmol/L (ref 135–145)
Total Bilirubin: 0.5 mg/dL (ref 0.0–1.2)
Total Protein: 6.5 g/dL (ref 6.5–8.1)

## 2024-07-17 LAB — GLUCOSE, CAPILLARY
Glucose-Capillary: 279 mg/dL — ABNORMAL HIGH (ref 70–99)
Glucose-Capillary: 224 mg/dL — ABNORMAL HIGH (ref 70–99)

## 2024-07-17 LAB — PROTIME-INR
INR: 1 (ref 0.8–1.2)
Prothrombin Time: 13.7 s (ref 11.4–15.2)

## 2024-07-17 MED ORDER — SODIUM CHLORIDE 0.9 % IV SOLN
INTRAVENOUS | Status: AC
  Filled 2024-07-17: qty 2

## 2024-07-17 MED ORDER — IOHEXOL 300 MG/ML  SOLN
100.0000 mL | Freq: Once | INTRAMUSCULAR | Status: AC | PRN
  Administered 2024-07-17: 10 mL via INTRA_ARTERIAL

## 2024-07-17 MED ORDER — AMOXICILLIN-POT CLAVULANATE 875-125 MG PO TABS: 1.0000 | ORAL_TABLET | Freq: Two times a day (BID) | ORAL | 0 refills | Status: AC

## 2024-07-17 MED ORDER — LIDOCAINE HCL (PF) 1 % IJ SOLN
20.0000 mL | Freq: Once | INTRAMUSCULAR | Status: AC
  Administered 2024-07-17: 2 mL via INTRADERMAL

## 2024-07-17 MED ORDER — FENTANYL CITRATE (PF) 100 MCG/2ML IJ SOLN
INTRAMUSCULAR | Status: AC | PRN
  Administered 2024-07-17 (×2): 50 ug via INTRAVENOUS

## 2024-07-17 MED ORDER — PROMETHAZINE HCL 25 MG/ML IJ SOLN
INTRAMUSCULAR | Status: AC
  Filled 2024-07-17: qty 1

## 2024-07-17 MED ORDER — YTTRIUM 90 INJECTION
21.9900 | INJECTION | Freq: Once | INTRAVENOUS | Status: AC
  Administered 2024-07-17: 21.99 via INTRA_ARTERIAL

## 2024-07-17 MED ORDER — LIDOCAINE HCL (PF) 1 % IJ SOLN
INTRAMUSCULAR | Status: AC
  Filled 2024-07-17: qty 30

## 2024-07-17 MED ORDER — HEPARIN SODIUM (PORCINE) 1000 UNIT/ML IJ SOLN
INTRAMUSCULAR | Status: AC
  Filled 2024-07-17: qty 10

## 2024-07-17 MED ORDER — MIDAZOLAM HCL (PF) 2 MG/2ML IJ SOLN
INTRAMUSCULAR | Status: AC | PRN
  Administered 2024-07-17: 1 mg via INTRAVENOUS
  Administered 2024-07-17: .5 mg via INTRAVENOUS
  Administered 2024-07-17: 1 mg via INTRAVENOUS
  Administered 2024-07-17: .5 mg via INTRAVENOUS

## 2024-07-17 MED ORDER — PROMETHAZINE HCL 25 MG/ML IJ SOLN
INTRAMUSCULAR | Status: AC | PRN
  Administered 2024-07-17: 25 mg via INTRAMUSCULAR

## 2024-07-17 MED ORDER — MIDAZOLAM HCL 2 MG/2ML IJ SOLN
INTRAMUSCULAR | Status: AC
  Filled 2024-07-17: qty 2

## 2024-07-17 MED ORDER — ONDANSETRON 8 MG/NS 50 ML IVPB
8.0000 mg | Freq: Once | INTRAVENOUS | Status: AC
  Administered 2024-07-17: 8 mg via INTRAVENOUS
  Filled 2024-07-17: qty 8

## 2024-07-17 MED ORDER — SODIUM CHLORIDE 0.9 % IV SOLN
2.0000 g | Freq: Once | INTRAVENOUS | Status: AC
  Administered 2024-07-17: 2 g via INTRAVENOUS

## 2024-07-17 MED ORDER — VERAPAMIL HCL 2.5 MG/ML IV SOLN
INTRAVENOUS | Status: AC
  Filled 2024-07-17: qty 2

## 2024-07-17 MED ORDER — PANTOPRAZOLE SODIUM 40 MG IV SOLR
40.0000 mg | Freq: Once | INTRAVENOUS | Status: AC
  Administered 2024-07-17: 40 mg via INTRAVENOUS
  Filled 2024-07-17: qty 10

## 2024-07-17 MED ORDER — ACETAMINOPHEN 325 MG PO TABS
650.0000 mg | ORAL_TABLET | ORAL | Status: DC
  Filled 2024-07-17: qty 2

## 2024-07-17 MED ORDER — FENTANYL CITRATE (PF) 100 MCG/2ML IJ SOLN
INTRAMUSCULAR | Status: AC
  Filled 2024-07-17: qty 2

## 2024-07-17 MED ORDER — NITROGLYCERIN IN D5W 100-5 MCG/ML-% IV SOLN
INTRAVENOUS | Status: AC
  Filled 2024-07-17: qty 250

## 2024-07-17 MED ORDER — SODIUM CHLORIDE 0.9 % IV SOLN: INTRAVENOUS | Status: DC

## 2024-07-17 MED ORDER — NITROGLYCERIN 2 % TD OINT
TOPICAL_OINTMENT | TRANSDERMAL | Status: AC
  Filled 2024-07-17: qty 1

## 2024-07-17 MED ORDER — IOHEXOL 300 MG/ML  SOLN
50.0000 mL | Freq: Once | INTRAMUSCULAR | Status: AC | PRN
  Administered 2024-07-17: 10 mL

## 2024-07-17 NOTE — Procedures (Signed)
 Interventional Radiology Procedure Note  Procedure: Transarterial radioembolization  Findings: Please refer to procedural dictation for full description. Y90 hepatic segment IVa segmentectomy.  Left radial artery access, TR band applied at 10:30 with 15 cc.  Complications: None immediate  Estimated Blood Loss: < 5 mL  Recommendations: TR band protocol release. IR will arrange MRI abdomen and clinic follow up in 3 months.   Ester Sides, MD

## 2024-07-17 NOTE — Progress Notes (Signed)
 1410 Nausea with vomiting dark green vomitus 100 cc.  Unable to take Tylenol  for headache.  Stated she felt better, did not want ice chips, or a cool cloth to her forehead.

## 2024-07-17 NOTE — Discharge Instructions (Addendum)
 At home, may have over the counter suppository to prevent constipation.Discharge Instructions:   Please call Interventional Radiology clinic [phone] with any questions or concerns.  You may remove your dressing and shower tomorrow.     Hepatic Artery Radioembolization, Care After  The following information offers guidance on how to care for yourself after your procedure. Your health care provider may also give you more specific instructions. If you have problems or questions, contact your health care provider. What can I expect after the procedure? After the procedure, it is possible to have: A slight fever for 7 to 10 days. This may be accompanied by pain, nausea, or vomiting, which is referred to as post-embolization syndrome. You may be given medicine to help relieve these symptoms. If your fever gets worse, tell your health care provider. Tiredness (fatigue). Loss of appetite. This should gradually improve after about 1 week. Abdominal pain on your right side. Soreness and tenderness in your groin area where the needle and catheter were placed (puncture site). Follow these instructions at home: Puncture site care Follow instructions from your health care provider about how to take care of the puncture site. Make sure you: Wash your hands with soap and water for at least 20 seconds before and after you change your bandage (dressing). If soap and water are not available, use hand sanitizer. Change your dressing as told by your health care provider. Check your puncture site every day for signs of infection. Check for: More redness, swelling, or pain. Fluid or blood. Warmth. Pus or a bad smell. Activity Rest as told by your health care provider. Return to your normal activities as told by your health care provider. Ask your health care provider what activities are safe for you. Avoid sitting for a long time without moving. Get up to take short walks every 1-2 hours. This is  important to improve blood flow and breathing. Ask for help if you feel weak or unsteady. If you were given a sedative during the procedure, it can affect you for several hours. Do not drive or operate machinery until your health care provider says that it is safe. Do not lift anything that is heavier than 10 lb (4.5 kg), or the limit that you are told, until your health care provider says that it is safe. Medicines Take over-the-counter and prescription medicines only as told by your health care provider. Ask your health care provider if the medicine prescribed to you: Requires you to avoid driving or using machinery. Can cause constipation. You may need to take these actions to prevent or treat constipation: Drink enough fluid to keep your urine pale yellow. Take over-the-counter or prescription medicines. Eat foods that are high in fiber, such as beans, whole grains, and fresh fruits and vegetables. Limit foods that are high in fat and processed sugars, such as fried or sweet foods. General instructions Eat frequent, small meals until your appetite returns. Follow instructions from your health care provider about eating or drinking restrictions. Do not take baths, swim, or use a hot tub until your health care provider approves. You may take showers. Wash your puncture site with mild soap and water, and pat the area dry. Wear compression stockings as told by your health care provider. These stockings help to prevent blood clots and reduce swelling in your legs. Keep all follow-up visits. This is important. You may need to have blood tests and imaging tests. Contact a health care provider if: You have any of these signs of  infection: More redness, swelling, or pain around your puncture site. Fluid or blood coming from your puncture site. Warmth coming from your puncture site. Pus or a bad smell coming from your puncture site. You have pain that: Gets worse. Does not get better with  medicine. Feels like very bad heartburn. Is in the middle of your abdomen, above your belly button. You have any signs of infection or liver failure, such as: Your skin or the white parts of your eyes turn yellow (jaundice). The color of your urine changes to dark brown. The color of your stool (feces) changes to light yellow. Your abdominal measurement (girth) increases in a short period of time. You gain more than 5 lb (2.3 kg) in a short period of time. Get help right away if: You have a fever that lasts more than 10 days or is higher than what your health care provider told you to expect. You develop any of the following in your legs: Pain. Swelling. Skin that is cold or pale or turns blue. You have chest pain. You have blood in your vomit, saliva, or stool. You have trouble breathing. These symptoms may represent a serious problem that is an emergency. Do not wait to see if the symptoms will go away. Get medical help right away. Call your local emergency services (911 in the [place] Do not drive yourself to the hospital. Summary After the procedure, it is possible to have a slight fever for up to 7-10 days, tiredness, loss of appetite, abdominal pain on the right side, and groin tenderness where the catheter was placed. Do not come in close contact with people for up to a week after your procedure, as told by your health care provider. Follow instructions from your health care provider about how to take care of the puncture site. Contact a health care provider if you have any signs of infection. Get help right away if you develop pain or swelling in your legs or if your legs feel cool or look pale. This information is not intended to replace advice given to you by your health care provider. Make sure you discuss any questions you have with your health care provider. Document Revised: 05/03/2020 Document Reviewed: 05/03/2020 Elsevier Patient Education  2022 [employer]       Moderate Conscious Sedation, Adult, Care After  This sheet gives you information about how to care for yourself after your procedure. Your health care provider may also give you more specific instructions. If you have problems or questions, contact your health care provider. What can I expect after the procedure? After the procedure, it is common to have: Sleepiness for several hours. Impaired judgment for several hours. Difficulty with balance. Vomiting if you eat too soon. Follow these instructions at home: For the time period you were told by your health care provider: Rest. Do not participate in activities where you could fall or become injured. Do not drive or use machinery. Do not drink alcohol. Do not take sleeping pills or medicines that cause drowsiness. Do not make important decisions or sign legal documents. Do not take care of children on your own. Eating and drinking  Follow the diet recommended by your health care provider. Drink enough fluid to keep your urine pale yellow. If you vomit: Drink water, juice, or soup when you can drink without vomiting. Make sure you have little or no nausea before eating solid foods. General instructions Take over-the-counter and prescription medicines only as told by your health care  provider. Have a responsible adult stay with you for the time you are told. It is important to have someone help care for you until you are awake and alert. Do not smoke. Keep all follow-up visits as told by your health care provider. This is important. Contact a health care provider if: You are still sleepy or having trouble with balance after 24 hours. You feel light-headed. You keep feeling nauseous or you keep vomiting. You develop a rash. You have a fever. You have redness or swelling around the IV site. Get help right away if: You have trouble breathing. You have new-onset confusion at home. Summary After the procedure, it is common to  feel sleepy, have impaired judgment, or feel nauseous if you eat too soon. Rest after you get home. Know the things you should not do after the procedure. Follow the diet recommended by your health care provider and drink enough fluid to keep your urine pale yellow. Get help right away if you have trouble breathing or new-onset confusion at home. This information is not intended to replace advice given to you by your health care provider. Make sure you discuss any questions you have with your health care provider. Document Revised: 09/27/2019 Document Reviewed: 04/25/2019 Elsevier Patient Education  2023 [employer] This sheet gives you information about how to care for yourself after your procedure. Your health care provider may also give you more specific instructions. If you have problems or questions, contact your health care provider. What can I expect after the procedure? After the procedure, it is common to have: Sleepiness for several hours. Impaired judgment for several hours. Difficulty with balance. Vomiting if you eat too soon. Follow these instructions at home: For the time period you were told by your health care provider: Rest. Do not participate in activities where you could fall or become injured. Do not drive or use machinery. Do not drink alcohol. Do not take sleeping pills or medicines that cause drowsiness. Do not make important decisions or sign legal documents. Do not take care of children on your own.      Eating and drinking Follow the diet recommended by your health care provider. Drink enough fluid to keep your urine pale yellow. If you vomit: Drink water, juice, or soup when you can drink without vomiting. Make sure you have little or no nausea before eating solid foods.   General instructions Take over-the-counter and prescription medicines only as told by your health care provider. Have a responsible adult stay with you for the time you are told. It  is important to have someone help care for you until you are awake and alert. Do not smoke. Keep all follow-up visits as told by your health care provider. This is important. Contact a health care provider if: You are still sleepy or having trouble with balance after 24 hours. You feel light-headed. You keep feeling nauseous or you keep vomiting. You develop a rash. You have a fever. You have redness or swelling around the IV site. Get help right away if: You have trouble breathing. You have new-onset confusion at home. Summary After the procedure, it is common to feel sleepy, have impaired judgment, or feel nauseous if you eat too soon. Rest after you get home. Know the things you should not do after the procedure. Follow the diet recommended by your health care provider and drink enough fluid to keep your urine pale yellow. Get help right away if you have trouble breathing or new-onset  confusion at home. This information is not intended to replace advice given to you by your health care provider. Make sure you discuss any questions you have with your health care provider. Document Revised: 09/27/2019 Document Reviewed: 04/25/2019 Elsevier Patient Education  2021 [employer]   AFTER YOUR NEXT PROCEDURE  Radiation precautions For up to a week after your procedure, there will be a small amount of radioactivity near your liver. This is not especially dangerous to other people. However, as told by your health care provider, you should follow these precautions for 7 days: Do not come in close contact with people. Do not sleep in the same bed as someone else. Do not hold children or babies. Do not have contact with pregnant women.  Post Y-90 Radioembolization Discharge Instructions  You have been given a radioactive material during your procedure.  While it is safe for you to be discharged home from the hospital, you need to proceed directly home.    Do not use public transportation,  including air travel, lasting more than 2 hours for 1 week.  Avoid crowded public places for 1 week.  Adult visitors should try to avoid close contact with you for 1 week.    Children and pregnant females should not visit or have close contact with you for 1 week.  Items that you touch are not radioactive.  Do not sleep in the same bed as your partner for 1 week, and a condom should be used for sexual activity during the first 24 hours.  Your blood may be radioactive and caution should be used if any bleeding occurs during the recovery period.  Body fluids may be radioactive for 24 hours.  Wash your hands after voiding.  Men should sit to urinate.  Dispose of any soiled materials (flush down toilet or place in trash at home) during the first day.  Drink 6 to 8 glasses of fluids per day for 5 days to hydrate yourself.  If you need to see a doctor during the first week, you must let them know that you were treated with yttrium-90 microspheres, and will be slightly radioactive.  They can call Interventional Radiology [phone] with any questions.

## 2024-09-23 ENCOUNTER — Ambulatory Visit: Admitting: Internal Medicine
